# Patient Record
Sex: Male | Born: 1945 | Race: Asian | Hispanic: No | Marital: Married | Smoking: Current some day smoker
Health system: Southern US, Community
[De-identification: ages and names within clinical notes are randomized; demographics above are authoritative.]

## PROBLEM LIST (undated history)

## (undated) DIAGNOSIS — N189 Chronic kidney disease, unspecified: Secondary | ICD-10-CM

## (undated) DIAGNOSIS — F419 Anxiety disorder, unspecified: Secondary | ICD-10-CM

## (undated) DIAGNOSIS — E785 Hyperlipidemia, unspecified: Secondary | ICD-10-CM

## (undated) DIAGNOSIS — D649 Anemia, unspecified: Secondary | ICD-10-CM

## (undated) DIAGNOSIS — Z9289 Personal history of other medical treatment: Secondary | ICD-10-CM

## (undated) DIAGNOSIS — N186 End stage renal disease: Secondary | ICD-10-CM

## (undated) DIAGNOSIS — F329 Major depressive disorder, single episode, unspecified: Secondary | ICD-10-CM

## (undated) DIAGNOSIS — I10 Essential (primary) hypertension: Secondary | ICD-10-CM

## (undated) DIAGNOSIS — R0600 Dyspnea, unspecified: Secondary | ICD-10-CM

## (undated) DIAGNOSIS — R011 Cardiac murmur, unspecified: Secondary | ICD-10-CM

## (undated) DIAGNOSIS — E119 Type 2 diabetes mellitus without complications: Secondary | ICD-10-CM

## (undated) DIAGNOSIS — I509 Heart failure, unspecified: Secondary | ICD-10-CM

## (undated) DIAGNOSIS — C801 Malignant (primary) neoplasm, unspecified: Secondary | ICD-10-CM

## (undated) DIAGNOSIS — E079 Disorder of thyroid, unspecified: Secondary | ICD-10-CM

## (undated) DIAGNOSIS — F32A Depression, unspecified: Secondary | ICD-10-CM

## (undated) DIAGNOSIS — N400 Enlarged prostate without lower urinary tract symptoms: Secondary | ICD-10-CM

## (undated) DIAGNOSIS — K219 Gastro-esophageal reflux disease without esophagitis: Secondary | ICD-10-CM

## (undated) DIAGNOSIS — I480 Paroxysmal atrial fibrillation: Secondary | ICD-10-CM

## (undated) HISTORY — DX: Type 2 diabetes mellitus without complications: E11.9

## (undated) HISTORY — DX: Hyperlipidemia, unspecified: E78.5

## (undated) HISTORY — PX: APPENDECTOMY: SHX54

## (undated) HISTORY — DX: Essential (primary) hypertension: I10

## (undated) HISTORY — DX: Chronic kidney disease, unspecified: N18.9

## (undated) HISTORY — DX: Disorder of thyroid, unspecified: E07.9

## (undated) HISTORY — DX: Heart failure, unspecified: I50.9

## (undated) HISTORY — DX: Depression, unspecified: F32.A

## (undated) HISTORY — DX: Anemia, unspecified: D64.9

---
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
## 2013-06-28 DIAGNOSIS — Z23 Encounter for immunization: Secondary | ICD-10-CM | POA: Diagnosis not present

## 2013-08-08 ENCOUNTER — Ambulatory Visit (INDEPENDENT_AMBULATORY_CARE_PROVIDER_SITE_OTHER): Payer: Medicare Other | Admitting: Family Medicine

## 2013-08-08 VITALS — BP 140/82 | HR 73 | Temp 98.2°F | Resp 18 | Ht 66.5 in | Wt 162.0 lb

## 2013-08-08 DIAGNOSIS — M109 Gout, unspecified: Secondary | ICD-10-CM | POA: Diagnosis not present

## 2013-08-08 DIAGNOSIS — E039 Hypothyroidism, unspecified: Secondary | ICD-10-CM | POA: Diagnosis not present

## 2013-08-08 DIAGNOSIS — I10 Essential (primary) hypertension: Secondary | ICD-10-CM

## 2013-08-08 LAB — COMPREHENSIVE METABOLIC PANEL
AST: 10 U/L (ref 0–37)
Albumin: 4.5 g/dL (ref 3.5–5.2)
Alkaline Phosphatase: 59 U/L (ref 39–117)
BILIRUBIN TOTAL: 0.5 mg/dL (ref 0.2–1.2)
BUN: 28 mg/dL — AB (ref 6–23)
CALCIUM: 9.7 mg/dL (ref 8.4–10.5)
CHLORIDE: 100 meq/L (ref 96–112)
CO2: 32 meq/L (ref 19–32)
CREATININE: 1.34 mg/dL (ref 0.50–1.35)
Glucose, Bld: 124 mg/dL — ABNORMAL HIGH (ref 70–99)
Potassium: 3.8 mEq/L (ref 3.5–5.3)
Sodium: 138 mEq/L (ref 135–145)
Total Protein: 7.3 g/dL (ref 6.0–8.3)

## 2013-08-08 MED ORDER — TRIAMTERENE 50 MG PO CAPS: 50.0000 mg | ORAL_CAPSULE | Freq: Two times a day (BID) | ORAL | Status: DC

## 2013-08-08 MED ORDER — ALLOPURINOL 100 MG PO TABS: 100.0000 mg | ORAL_TABLET | Freq: Every day | ORAL | Status: DC

## 2013-08-08 MED ORDER — ATENOLOL 50 MG PO TABS: 50.0000 mg | ORAL_TABLET | Freq: Every day | ORAL | Status: DC

## 2013-08-08 MED ORDER — AMLODIPINE BESYLATE 5 MG PO TABS: 5.0000 mg | ORAL_TABLET | Freq: Every day | ORAL | Status: DC

## 2013-08-08 MED ORDER — LEVOTHYROXINE SODIUM 50 MCG PO TABS: 100.0000 ug | ORAL_TABLET | Freq: Every day | ORAL | Status: DC

## 2013-08-08 MED ORDER — HYDROCHLOROTHIAZIDE 25 MG PO TABS: 25.0000 mg | ORAL_TABLET | Freq: Every day | ORAL | Status: DC

## 2013-08-08 NOTE — Progress Notes (Signed)
Subjective: 68 year old man from [place] who does not speak [place]. His wife speaks good [place]. [place] He has medications for his blood pressure and thyroid. He's been out of his thyroid medication for one week. He does not know what the dose was, and threw the bottle away. It was a little white pill. I suspect it was the 50 mcg because that is white. Review of systems is really unremarkable. He does quite well.  Objective: No acute distress. Neck supple without nodes or thyromegaly. Chest clear. Heart regular without murmurs.  Assessment: Hypothyroidism Hypertension History of gout (based on his medicines)  Plan: Prescribe his medications for 6 months Check C. met and TSH

## 2013-08-08 NOTE — Patient Instructions (Signed)
See if you can find out what dose of thyroid you were on at home.  I have prescribed all of your medications in case you've run out  I will let you know the results of your labs  Return in about 6 months if you were here in the [place]

## 2013-08-09 ENCOUNTER — Encounter: Payer: Self-pay | Admitting: Family Medicine

## 2013-08-09 LAB — TSH: TSH: 3.322 u[IU]/mL (ref 0.350–4.500)

## 2013-08-14 ENCOUNTER — Telehealth: Payer: Self-pay

## 2013-08-14 DIAGNOSIS — I10 Essential (primary) hypertension: Secondary | ICD-10-CM

## 2013-08-14 NOTE — Telephone Encounter (Signed)
PA needed for Dyrenium. Wife reports pt uses this for swelling under the eyes. She does not know of any other meds he has used for this previously. [employer] and they are faxing form to complete.

## 2013-08-30 NOTE — Telephone Encounter (Signed)
Discontinue dyrenium  rx aldactone 25  #30 one daily  refillx x 1  Tell patient to return in 6 weeks to recheck BP.

## 2013-08-30 NOTE — Telephone Encounter (Signed)
Called [employer] back because have never received PA form. Completed PA on the phone and it was pended for review.

## 2013-08-30 NOTE — Telephone Encounter (Signed)
[employer] called and denied d/t med not being on formulary and pt has not tried and failed the two formulary alternatives which are spirolactone or HCTZ-triamterene. Dr Linna Darner, do you want to Rx one of these?

## 2013-08-31 NOTE — Telephone Encounter (Signed)
Left message on machine to call back  

## 2013-09-01 ENCOUNTER — Telehealth: Payer: Self-pay

## 2013-09-01 MED ORDER — SPIRONOLACTONE 25 MG PO TABS: 25.0000 mg | ORAL_TABLET | Freq: Every day | ORAL | Status: DC

## 2013-09-01 NOTE — Telephone Encounter (Signed)
Spoke to wife and gave her the message about the new medication. Will have

## 2013-09-01 NOTE — Telephone Encounter (Signed)
....  Chelle send it in to pharmacy

## 2013-09-01 NOTE — Telephone Encounter (Signed)
[phone] PATIENTS WIFE CALLING ABOUT A CALL THAT THEY RECEIVED YESTERDAY

## 2013-09-01 NOTE — Telephone Encounter (Signed)
Spoke to wife and Chelle will send in her script

## 2013-09-01 NOTE — Telephone Encounter (Signed)
Rx sent.  Meds ordered this encounter  Medications  . spironolactone (ALDACTONE) 25 MG tablet    Sig: Take 1 tablet (25 mg total) by mouth daily.    Dispense:  30 tablet    Refill:  1    Order Specific Question:  Supervising Provider    Answer:  DOOLITTLE, ROBERT P [id number]

## 2013-10-04 ENCOUNTER — Other Ambulatory Visit: Payer: Self-pay | Admitting: Family Medicine

## 2013-10-19 ENCOUNTER — Ambulatory Visit (INDEPENDENT_AMBULATORY_CARE_PROVIDER_SITE_OTHER): Payer: Medicare Other | Admitting: Family Medicine

## 2013-10-19 VITALS — BP 114/68 | HR 70 | Temp 98.0°F | Resp 17 | Ht 65.5 in | Wt 164.0 lb

## 2013-10-19 DIAGNOSIS — I10 Essential (primary) hypertension: Secondary | ICD-10-CM | POA: Diagnosis not present

## 2013-10-19 DIAGNOSIS — M109 Gout, unspecified: Secondary | ICD-10-CM

## 2013-10-19 DIAGNOSIS — E039 Hypothyroidism, unspecified: Secondary | ICD-10-CM | POA: Diagnosis not present

## 2013-10-19 DIAGNOSIS — R7309 Other abnormal glucose: Secondary | ICD-10-CM

## 2013-10-19 LAB — POCT GLYCOSYLATED HEMOGLOBIN (HGB A1C): HEMOGLOBIN A1C: 6.2

## 2013-10-19 MED ORDER — SPIRONOLACTONE 25 MG PO TABS: 25.0000 mg | ORAL_TABLET | Freq: Every day | ORAL | Status: DC

## 2013-10-19 MED ORDER — HYDROCHLOROTHIAZIDE 25 MG PO TABS: 25.0000 mg | ORAL_TABLET | Freq: Every day | ORAL | Status: DC

## 2013-10-19 MED ORDER — LEVOTHYROXINE SODIUM 50 MCG PO TABS: ORAL_TABLET | ORAL | Status: DC

## 2013-10-19 MED ORDER — ALLOPURINOL 100 MG PO TABS: 100.0000 mg | ORAL_TABLET | Freq: Every day | ORAL | Status: DC

## 2013-10-19 MED ORDER — AMLODIPINE BESYLATE 5 MG PO TABS: 5.0000 mg | ORAL_TABLET | Freq: Every day | ORAL | Status: DC

## 2013-10-19 MED ORDER — ATENOLOL 50 MG PO TABS: 50.0000 mg | ORAL_TABLET | Freq: Every day | ORAL | Status: DC

## 2013-10-19 NOTE — Progress Notes (Deleted)
   Subjective:    Patient ID: Adam Reid, male    DOB: 1945-11-22, 68 y.o.   MRN: [id number]  HPI    Review of Systems     Objective:   Physical Exam        Assessment & Plan:

## 2013-10-19 NOTE — Patient Instructions (Addendum)
Continue current medications  I will let you know the results of your labs and whether we need to make any changes.  If you are continuing to do well weight and return in about 6 months. Check in September or to find out when it would be best to come in since I may be out of the country in October.

## 2013-10-19 NOTE — Progress Notes (Signed)
Subjective: Patient is here for routine followup visit. He is doing well. He has not had any major complaints. He has taken only 50 mcg of Levothroid rather than 100 mcg. We will recheck the level and see how he is doing. He has no other major complaints. His HEENT is normal. Respiratory normal. Cardiovascular normal. GI normal. Musculoskeletal normal. GU normal.  He has not gone back to air on any since his last time here. His mother who is 65 is being taken care of by his sister.  Objective: Pleasant man in no acute distress. His wife interpreted. He is TMs are normal. Throat clear. Neck supple without nodes or thyromegaly. No carotid bruit chest clear. Heart regular without murmurs. Abdomen soft and nontender. No ankle edema.  Assessment: Hypertension Hypothyroidism History of hyperuricemia  Plan: Represcribed his medications Return in 6 months, sooner if problems.

## 2013-10-20 LAB — BASIC METABOLIC PANEL
BUN: 26 mg/dL — ABNORMAL HIGH (ref 6–23)
CALCIUM: 9.4 mg/dL (ref 8.4–10.5)
CO2: 30 meq/L (ref 19–32)
CREATININE: 1.37 mg/dL — AB (ref 0.50–1.35)
Chloride: 102 mEq/L (ref 96–112)
Glucose, Bld: 120 mg/dL — ABNORMAL HIGH (ref 70–99)
Potassium: 3.8 mEq/L (ref 3.5–5.3)
Sodium: 141 mEq/L (ref 135–145)

## 2013-10-20 LAB — TSH: TSH: 1.862 u[IU]/mL (ref 0.350–4.500)

## 2013-10-20 LAB — URIC ACID: Uric Acid, Serum: 5.9 mg/dL (ref 4.0–7.8)

## 2013-10-23 ENCOUNTER — Encounter: Payer: Self-pay | Admitting: Family Medicine

## 2013-10-24 ENCOUNTER — Telehealth: Payer: Self-pay

## 2013-10-24 NOTE — Telephone Encounter (Signed)
Received PA req from pharm. This med is on preferred list on ins and shouldn't need PA. Pharmacist reported that the Rx sent on 10/19/13 by Dr Linna Darner was marked DAW w/note to dispense name brand only. I do not see those notes on our end in EPIC. It looks like it was sent over for generic and no notes in OV notes concerning need for name brand. Dr Linna Darner, does pt need the name brand only, and if so, why? I will complete PA if brand necessary.

## 2013-10-26 NOTE — Telephone Encounter (Signed)
If it was marked dispense as written that was an error as far as I know. Generic is fine.

## 2013-10-31 NOTE — Telephone Encounter (Signed)
Notified pharmacist.

## 2014-01-15 ENCOUNTER — Other Ambulatory Visit: Payer: Self-pay | Admitting: Family Medicine

## 2014-04-14 ENCOUNTER — Other Ambulatory Visit: Payer: Self-pay | Admitting: Family Medicine

## 2014-04-30 ENCOUNTER — Ambulatory Visit (INDEPENDENT_AMBULATORY_CARE_PROVIDER_SITE_OTHER): Payer: Medicare Other

## 2014-04-30 ENCOUNTER — Ambulatory Visit (INDEPENDENT_AMBULATORY_CARE_PROVIDER_SITE_OTHER): Payer: Medicare Other | Admitting: Internal Medicine

## 2014-04-30 VITALS — BP 122/80 | HR 58 | Temp 98.4°F | Resp 18 | Ht 67.0 in | Wt 158.0 lb

## 2014-04-30 DIAGNOSIS — E039 Hypothyroidism, unspecified: Secondary | ICD-10-CM | POA: Diagnosis not present

## 2014-04-30 DIAGNOSIS — I639 Cerebral infarction, unspecified: Secondary | ICD-10-CM

## 2014-04-30 DIAGNOSIS — R634 Abnormal weight loss: Secondary | ICD-10-CM

## 2014-04-30 DIAGNOSIS — I10 Essential (primary) hypertension: Secondary | ICD-10-CM | POA: Diagnosis not present

## 2014-04-30 DIAGNOSIS — Z1211 Encounter for screening for malignant neoplasm of colon: Secondary | ICD-10-CM | POA: Diagnosis not present

## 2014-04-30 DIAGNOSIS — M1000 Idiopathic gout, unspecified site: Secondary | ICD-10-CM | POA: Diagnosis not present

## 2014-04-30 DIAGNOSIS — Z125 Encounter for screening for malignant neoplasm of prostate: Secondary | ICD-10-CM | POA: Diagnosis not present

## 2014-04-30 DIAGNOSIS — Z79899 Other long term (current) drug therapy: Secondary | ICD-10-CM

## 2014-04-30 DIAGNOSIS — M10079 Idiopathic gout, unspecified ankle and foot: Secondary | ICD-10-CM | POA: Diagnosis not present

## 2014-04-30 DIAGNOSIS — M109 Gout, unspecified: Secondary | ICD-10-CM

## 2014-04-30 DIAGNOSIS — I6350 Cerebral infarction due to unspecified occlusion or stenosis of unspecified cerebral artery: Secondary | ICD-10-CM | POA: Diagnosis not present

## 2014-04-30 DIAGNOSIS — Z789 Other specified health status: Secondary | ICD-10-CM

## 2014-04-30 LAB — POCT CBC
Granulocyte percent: 76.4 %G (ref 37–80)
HEMATOCRIT: 45.8 % (ref 43.5–53.7)
HEMOGLOBIN: 15.4 g/dL (ref 14.1–18.1)
LYMPH, POC: 2.1 (ref 0.6–3.4)
MCH, POC: 28.8 pg (ref 27–31.2)
MCHC: 33.5 g/dL (ref 31.8–35.4)
MCV: 86 fL (ref 80–97)
MID (cbc): 0.5 (ref 0–0.9)
MPV: 8.5 fL (ref 0–99.8)
POC GRANULOCYTE: 8.2 — AB (ref 2–6.9)
POC LYMPH PERCENT: 19.2 %L (ref 10–50)
POC MID %: 4.4 %M (ref 0–12)
Platelet Count, POC: 289 10*3/uL (ref 142–424)
RBC: 5.33 M/uL (ref 4.69–6.13)
RDW, POC: 13.7 %
WBC: 10.7 10*3/uL — AB (ref 4.6–10.2)

## 2014-04-30 LAB — POCT UA - MICROSCOPIC ONLY
Bacteria, U Microscopic: NEGATIVE
Casts, Ur, LPF, POC: NEGATIVE
Crystals, Ur, HPF, POC: NEGATIVE
RBC, urine, microscopic: NEGATIVE
YEAST UA: NEGATIVE

## 2014-04-30 LAB — POCT URINALYSIS DIPSTICK
GLUCOSE UA: NEGATIVE
LEUKOCYTES UA: NEGATIVE
NITRITE UA: NEGATIVE
Protein, UA: 100
RBC UA: NEGATIVE
Spec Grav, UA: >=1.03
Urobilinogen, UA: 1
pH, UA: 5.5

## 2014-04-30 LAB — IFOBT (OCCULT BLOOD): IFOBT: NEGATIVE

## 2014-04-30 MED ORDER — AMLODIPINE BESYLATE 10 MG PO TABS: 10.0000 mg | ORAL_TABLET | Freq: Every day | ORAL | Status: DC

## 2014-04-30 MED ORDER — HYDROCHLOROTHIAZIDE 25 MG PO TABS: ORAL_TABLET | ORAL | Status: DC

## 2014-04-30 MED ORDER — ATENOLOL 50 MG PO TABS: 50.0000 mg | ORAL_TABLET | Freq: Every day | ORAL | Status: DC

## 2014-04-30 NOTE — Patient Instructions (Signed)
Smoking Cessation Quitting smoking is important to your health and has many advantages. However, it is not always easy to quit since nicotine is a very addictive drug. Oftentimes, people try 3 times or more before being able to quit. This document explains the best ways for you to prepare to quit smoking. Quitting takes hard work and a lot of effort, but you can do it. ADVANTAGES OF QUITTING SMOKING  You will live longer, feel better, and live better.  Your body will feel the impact of quitting smoking almost immediately.  Within 20 minutes, blood pressure decreases. Your pulse returns to its normal level.  After 8 hours, carbon monoxide levels in the blood return to normal. Your oxygen level increases.  After 24 hours, the chance of having a heart attack starts to decrease. Your breath, hair, and body stop smelling like smoke.  After 48 hours, damaged nerve endings begin to recover. Your sense of taste and smell improve.  After 72 hours, the body is virtually free of nicotine. Your bronchial tubes relax and breathing becomes easier.  After 2 to 12 weeks, lungs can hold more air. Exercise becomes easier and circulation improves.  The risk of having a heart attack, stroke, cancer, or lung disease is greatly reduced.  After 1 year, the risk of coronary heart disease is cut in half.  After 5 years, the risk of stroke falls to the same as a nonsmoker.  After 10 years, the risk of lung cancer is cut in half and the risk of other cancers decreases significantly.  After 15 years, the risk of coronary heart disease drops, usually to the level of a nonsmoker.  If you are pregnant, quitting smoking will improve your chances of having a healthy baby.  The people you live with, especially any children, will be healthier.  You will have extra money to spend on things other than cigarettes. QUESTIONS TO THINK ABOUT BEFORE ATTEMPTING TO QUIT You may want to talk about your answers with your  health care provider.  Why do you want to quit?  If you tried to quit in the past, what helped and what did not?  What will be the most difficult situations for you after you quit? How will you plan to handle them?  Who can help you through the tough times? Your family? Friends? A health care provider?  What pleasures do you get from smoking? What ways can you still get pleasure if you quit? Here are some questions to ask your health care provider:  How can you help me to be successful at quitting?  What medicine do you think would be best for me and how should I take it?  What should I do if I need more help?  What is smoking withdrawal like? How can I get information on withdrawal? GET READY  Set a quit date.  Change your environment by getting rid of all cigarettes, ashtrays, matches, and lighters in your home, car, or work. Do not let people smoke in your home.  Review your past attempts to quit. Think about what worked and what did not. GET SUPPORT AND ENCOURAGEMENT You have a better chance of being successful if you have help. You can get support in many ways.  Tell your family, friends, and coworkers that you are going to quit and need their support. Ask them not to smoke around you.  Get individual, group, or telephone counseling and support. Programs are available at local hospitals and health centers. Call   your local health department for information about programs in your area.  Spiritual beliefs and practices may help some smokers quit.  Download a "quit meter" on your computer to keep track of quit statistics, such as how long you have gone without smoking, cigarettes not smoked, and money saved.  Get a self-help book about quitting smoking and staying off tobacco. [place] yourself from urges to smoke. Talk to someone, go for a walk, or occupy your time with a task.  Change your normal routine. Take a different route to work.  Drink tea instead of coffee. Eat breakfast in a different place.  Reduce your stress. Take a hot bath, exercise, or read a book.  Plan something enjoyable to do every day. Reward yourself for not smoking.  Explore interactive web-based programs that specialize in helping you quit. GET MEDICINE AND USE IT CORRECTLY Medicines can help you stop smoking and decrease the urge to smoke. Combining medicine with the above behavioral methods and support can greatly increase your chances of successfully quitting smoking.  Nicotine replacement therapy helps deliver nicotine to your body without the negative effects and risks of smoking. Nicotine replacement therapy includes nicotine gum, lozenges, inhalers, nasal sprays, and skin patches. Some may be available over-the-counter and others require a prescription.  Antidepressant medicine helps people abstain from smoking, but how this works is unknown. This medicine is available by prescription.  Nicotinic receptor partial agonist medicine simulates the effect of nicotine in your brain. This medicine is available by prescription. Ask your health care provider for advice about which medicines to use and how to use them based on your health history. Your health care provider will tell you what side effects to look out for if you choose to be on a medicine or therapy. Carefully read the information on the package. Do not use any other product containing nicotine while using a nicotine replacement product.  RELAPSE OR DIFFICULT SITUATIONS Most relapses occur within the first 3 months after quitting. Do not be discouraged if you start smoking again. Remember, most people try several times before finally quitting. You may have symptoms of withdrawal because your body is used to nicotine. You may crave cigarettes, be irritable, feel very hungry, cough often, get headaches, or have difficulty concentrating. The withdrawal symptoms are only temporary. They are strongest  when you first quit, but they will go away within 10-14 days. To reduce the chances of relapse, try to:  Avoid drinking alcohol. Drinking lowers your chances of successfully quitting.  Reduce the amount of caffeine you consume. Once you quit smoking, the amount of caffeine in your body increases and can give you symptoms, such as a rapid heartbeat, sweating, and anxiety.  Avoid smokers because they can make you want to smoke.  Do not let weight gain distract you. Many smokers will gain weight when they quit, usually less than 10 pounds. Eat a healthy diet and stay active. You can always lose the weight gained after you quit.  Find ways to improve your mood other than smoking. FOR MORE INFORMATION  www.smokefree.gov  Document Released: 06/22/2001 Document Revised: 11/12/2013 Document Reviewed: 10/07/2011 [facility] Patient Information 2015 [place], [place]. This information is not intended to replace advice given to you by your health care provider. Make sure you discuss any questions you have with your health care provider. Gout Gout is an inflammatory arthritis caused by a buildup of uric acid crystals in the joints. Uric acid is a chemical  that is normally present in the blood. When the level of uric acid in the blood is too high it can form crystals that deposit in your joints and tissues. This causes joint redness, soreness, and swelling (inflammation). Repeat attacks are common. Over time, uric acid crystals can form into masses (tophi) near a joint, destroying bone and causing disfigurement. Gout is treatable and often preventable. CAUSES  The disease begins with elevated levels of uric acid in the blood. Uric acid is produced by your body when it breaks down a naturally found substance called purines. Certain foods you eat, such as meats and fish, contain high amounts of purines. Causes of an elevated uric acid level include:  Being passed down from parent to child (heredity).  Diseases  that cause increased uric acid production (such as obesity, psoriasis, and certain cancers).  Excessive alcohol use.  Diet, especially diets rich in meat and seafood.  Medicines, including certain cancer-fighting medicines (chemotherapy), water pills (diuretics), and aspirin.  Chronic kidney disease. The kidneys are no longer able to remove uric acid well.  Problems with metabolism. Conditions strongly associated with gout include:  Obesity.  High blood pressure.  High cholesterol.  Diabetes. Not everyone with elevated uric acid levels gets gout. It is not understood why some people get gout and others do not. Surgery, joint injury, and eating too much of certain foods are some of the factors that can lead to gout attacks. SYMPTOMS   An attack of gout comes on quickly. It causes intense pain with redness, swelling, and warmth in a joint.  Fever can occur.  Often, only one joint is involved. Certain joints are more commonly involved:  Base of the big toe.  Knee.  Ankle.  Wrist.  Finger. Without treatment, an attack usually goes away in a few days to weeks. Between attacks, you usually will not have symptoms, which is different from many other forms of arthritis. DIAGNOSIS  Your caregiver will suspect gout based on your symptoms and exam. In some cases, tests may be recommended. The tests may include:  Blood tests.  Urine tests.  X-rays.  Joint fluid exam. This exam requires a needle to remove fluid from the joint (arthrocentesis). Using a microscope, gout is confirmed when uric acid crystals are seen in the joint fluid. TREATMENT  There are two phases to gout treatment: treating the sudden onset (acute) attack and preventing attacks (prophylaxis).  Treatment of an Acute Attack.  Medicines are used. These include anti-inflammatory medicines or steroid medicines.  An injection of steroid medicine into the affected joint is sometimes necessary.  The painful joint  is rested. Movement can worsen the arthritis.  You may use warm or cold treatments on painful joints, depending which works best for you.  Treatment to Prevent Attacks.  If you suffer from frequent gout attacks, your caregiver may advise preventive medicine. These medicines are started after the acute attack subsides. These medicines either help your kidneys eliminate uric acid from your body or decrease your uric acid production. You may need to stay on these medicines for a very long time.  The early phase of treatment with preventive medicine can be associated with an increase in acute gout attacks. For this reason, during the first few months of treatment, your caregiver may also advise you to take medicines usually used for acute gout treatment. Be sure you understand your caregiver's directions. Your caregiver may make several adjustments to your medicine dose before these medicines are effective.  Discuss  dietary treatment with your caregiver or dietitian. Alcohol and drinks high in sugar and fructose and foods such as meat, poultry, and seafood can increase uric acid levels. Your caregiver or dietitian can advise you on drinks and foods that should be limited. HOME CARE INSTRUCTIONS   Do not take aspirin to relieve pain. This raises uric acid levels.  Only take over-the-counter or prescription medicines for pain, discomfort, or fever as directed by your caregiver.  Rest the joint as much as possible. When in bed, keep sheets and blankets off painful areas.  Keep the affected joint raised (elevated).  Apply warm or cold treatments to painful joints. Use of warm or cold treatments depends on which works best for you.  Use crutches if the painful joint is in your leg.  Drink enough fluids to keep your urine clear or pale yellow. This helps your body get rid of uric acid. Limit alcohol, sugary drinks, and fructose drinks.  Follow your dietary instructions. Pay careful attention to the  amount of protein you eat. Your daily diet should emphasize fruits, vegetables, whole grains, and fat-free or low-fat milk products. Discuss the use of coffee, vitamin C, and cherries with your caregiver or dietitian. These may be helpful in lowering uric acid levels.  Maintain a healthy body weight. SEEK MEDICAL CARE IF:   You develop diarrhea, vomiting, or any side effects from medicines.  You do not feel better in 24 hours, or you are getting worse. SEEK IMMEDIATE MEDICAL CARE IF:   Your joint becomes suddenly more tender, and you have chills or a fever. MAKE SURE YOU:   Understand these instructions.  Will watch your condition.  Will get help right away if you are not doing well or get worse. Document Released: 06/25/2000 Document Revised: 11/12/2013 Document Reviewed: 02/09/2012 [facility] Patient Information 2015 [place], [place]. This information is not intended to replace advice given to you by your health care provider. Make sure you discuss any questions you have with your health care provider.

## 2014-04-30 NOTE — Progress Notes (Signed)
Subjective:    Patient ID: Adam Reid, male    DOB: October 05, 1945, 68 y.o.   MRN: [id number]  HPI 68 year old male here for follow up on gout.  He needs follow up labs to compare to previous labs.  He also needs refills on Levothyroxine. Patient is also concerned about recent weight loss.He states he has been losing his appetite.  Patient advised to stop smoking and try nicotine gum. He has never had heart problems and never had an EKG or  colonoscopy. Last exam of prostate was in [place].Was told it was enlarged. His meds and diagnosises have been initiated in [place]. Speaking [place] language, wife interprets.  Smoker Review of Systems     Objective:   Physical Exam  Vitals reviewed. Constitutional: He is oriented to person, place, and time. He appears well-developed and well-nourished. No distress.  HENT:  Head: Normocephalic.  Right Ear: External ear normal.  Left Ear: External ear normal.  Nose: Nose normal.  Mouth/Throat: Uvula is midline. Posterior oropharyngeal erythema present.  Eyes: Conjunctivae and EOM are normal. Pupils are equal, round, and reactive to light.  Neck: Normal range of motion. Neck supple. No tracheal deviation present. No thyromegaly present.  Cardiovascular: Normal rate, regular rhythm and normal heart sounds.   Pulmonary/Chest: Effort normal and breath sounds normal. No respiratory distress. He has no wheezes. He has no rales. He exhibits no tenderness.  Abdominal: Soft. Bowel sounds are normal. He exhibits no distension. There is no tenderness. There is no rebound and no guarding. Hernia confirmed negative in the right inguinal area and confirmed negative in the left inguinal area.  Genitourinary: Rectum normal and penis normal. Right testis shows mass and tenderness. Right testis shows no swelling. Left testis shows mass and tenderness. Left testis shows no swelling. Circumcised. No penile tenderness.  Multiple cysts both testis  Musculoskeletal: Normal range  of motion. He exhibits no edema and no tenderness.  Lymphadenopathy:    He has no cervical adenopathy.  Neurological: He is alert and oriented to person, place, and time. He has normal reflexes. No cranial nerve deficit. He exhibits normal muscle tone. Coordination normal.  Psychiatric: He has a normal mood and affect. His behavior is normal. Judgment and thought content normal.  Heart rate 66 during exam  EKG marked sinus bradycardia HR 48 [facility] reading (PRIMARY) by  Dr.Guest cxr abnormal, right hemi diaphragm smooth round mass versus evantration, medistinal mass possible  Results for orders placed in visit on 04/30/14  POCT UA - MICROSCOPIC ONLY      Result Value Ref Range   WBC, Ur, HPF, POC 0-3     RBC, urine, microscopic neg     Bacteria, U Microscopic neg     Mucus, UA large     Epithelial cells, urine per micros 0-5     Crystals, Ur, HPF, POC neg     Casts, Ur, LPF, POC neg     Yeast, UA neg    POCT URINALYSIS DIPSTICK      Result Value Ref Range   Color, UA dark yellow     Clarity, UA slightly cloudy     Glucose, UA neg     Bilirubin, UA moderate     Ketones, UA trace     Spec Grav, UA >=1.030     Blood, UA neg     pH, UA 5.5     Protein, UA 100     Urobilinogen, UA 1.0     Nitrite, UA  neg     Leukocytes, UA Negative    POCT CBC      Result Value Ref Range   WBC 10.7 (*) 4.6 - 10.2 K/uL   Lymph, poc 2.1  0.6 - 3.4   POC LYMPH PERCENT 19.2  10 - 50 %L   MID (cbc) 0.5  0 - 0.9   POC MID % 4.4  0 - 12 %M   POC Granulocyte 8.2 (*) 2 - 6.9   Granulocyte percent 76.4  37 - 80 %G   RBC 5.33  4.69 - 6.13 M/uL   Hemoglobin 15.4  14.1 - 18.1 g/dL   HCT, POC 45.8  43.5 - 53.7 %   MCV 86.0  80 - 97 fL   MCH, POC 28.8  27 - 31.2 pg   MCHC 33.5  31.8 - 35.4 g/dL   RDW, POC 13.7     Platelet Count, POC 289  142 - 424 K/uL   MPV 8.5  0 - 99.8 fL         Assessment & Plan:  Quit smoking Bradycardia--on atenolol Possibly euthyroid will hold synthroid and repeat TSH  1-2 months Will hold spironolactone Reduce HCTZ to 12.5 qd Start amlodipine 10mg  qd DC allopurinol Refer for colonoscopy

## 2014-05-01 LAB — COMPREHENSIVE METABOLIC PANEL
ALK PHOS: 81 U/L (ref 39–117)
ALT: 17 U/L (ref 0–53)
AST: 19 U/L (ref 0–37)
Albumin: 5.1 g/dL (ref 3.5–5.2)
BUN: 29 mg/dL — ABNORMAL HIGH (ref 6–23)
CO2: 28 meq/L (ref 19–32)
Calcium: 9.8 mg/dL (ref 8.4–10.5)
Chloride: 99 mEq/L (ref 96–112)
Creat: 1.8 mg/dL — ABNORMAL HIGH (ref 0.50–1.35)
GLUCOSE: 128 mg/dL — AB (ref 70–99)
Potassium: 4.5 mEq/L (ref 3.5–5.3)
Sodium: 137 mEq/L (ref 135–145)
Total Bilirubin: 0.8 mg/dL (ref 0.2–1.2)
Total Protein: 8 g/dL (ref 6.0–8.3)

## 2014-05-01 LAB — LIPID PANEL
Cholesterol: 229 mg/dL — ABNORMAL HIGH (ref 0–200)
HDL: 55 mg/dL (ref >39)
LDL CALC: 125 mg/dL — AB (ref 0–99)
TRIGLYCERIDES: 245 mg/dL — AB (ref <150)
Total CHOL/HDL Ratio: 4.2 Ratio
VLDL: 49 mg/dL — ABNORMAL HIGH (ref 0–40)

## 2014-05-01 LAB — URIC ACID: Uric Acid, Serum: 6.3 mg/dL (ref 4.0–7.8)

## 2014-05-01 LAB — TSH: TSH: 2.718 u[IU]/mL (ref 0.350–4.500)

## 2014-05-01 LAB — PSA: PSA: 3.77 ng/mL (ref <=4.00)

## 2014-05-17 ENCOUNTER — Other Ambulatory Visit: Payer: Self-pay | Admitting: Physician Assistant

## 2014-06-20 ENCOUNTER — Telehealth: Payer: Self-pay

## 2014-06-20 NOTE — Telephone Encounter (Signed)
Received fax from [facility] indicating pt may not be adhering to correct dosing of atenolol. I called and LMOM for pt voicing our concern and asking him to CB if he is having any problem taking/getting this medication. Also advised that [name] had wanted pt to RTC for f/up 2 wks after last OV in Oct. And that he should return to make sure new BP regime is working well.

## 2014-06-24 ENCOUNTER — Ambulatory Visit (INDEPENDENT_AMBULATORY_CARE_PROVIDER_SITE_OTHER): Payer: Medicare Other | Admitting: Internal Medicine

## 2014-06-24 VITALS — BP 118/64 | HR 61 | Temp 97.9°F | Resp 18 | Ht 66.0 in | Wt 161.6 lb

## 2014-06-24 DIAGNOSIS — N400 Enlarged prostate without lower urinary tract symptoms: Secondary | ICD-10-CM | POA: Diagnosis not present

## 2014-06-24 DIAGNOSIS — N289 Disorder of kidney and ureter, unspecified: Secondary | ICD-10-CM

## 2014-06-24 DIAGNOSIS — I639 Cerebral infarction, unspecified: Secondary | ICD-10-CM | POA: Diagnosis not present

## 2014-06-24 DIAGNOSIS — E7800 Pure hypercholesterolemia, unspecified: Secondary | ICD-10-CM

## 2014-06-24 DIAGNOSIS — R7309 Other abnormal glucose: Secondary | ICD-10-CM | POA: Diagnosis not present

## 2014-06-24 DIAGNOSIS — N184 Chronic kidney disease, stage 4 (severe): Secondary | ICD-10-CM

## 2014-06-24 DIAGNOSIS — Z8639 Personal history of other endocrine, nutritional and metabolic disease: Secondary | ICD-10-CM

## 2014-06-24 DIAGNOSIS — I10 Essential (primary) hypertension: Secondary | ICD-10-CM | POA: Diagnosis not present

## 2014-06-24 DIAGNOSIS — I1310 Hypertensive heart and chronic kidney disease without heart failure, with stage 1 through stage 4 chronic kidney disease, or unspecified chronic kidney disease: Secondary | ICD-10-CM | POA: Insufficient documentation

## 2014-06-24 DIAGNOSIS — R739 Hyperglycemia, unspecified: Secondary | ICD-10-CM | POA: Diagnosis not present

## 2014-06-24 DIAGNOSIS — Z79899 Other long term (current) drug therapy: Secondary | ICD-10-CM | POA: Diagnosis not present

## 2014-06-24 DIAGNOSIS — E118 Type 2 diabetes mellitus with unspecified complications: Secondary | ICD-10-CM

## 2014-06-24 LAB — GLUCOSE, POCT (MANUAL RESULT ENTRY): POC GLUCOSE: 136 mg/dL — AB (ref 70–99)

## 2014-06-24 LAB — BASIC METABOLIC PANEL
BUN: 26 mg/dL — ABNORMAL HIGH (ref 6–23)
CALCIUM: 9.8 mg/dL (ref 8.4–10.5)
CO2: 29 mEq/L (ref 19–32)
CREATININE: 1.47 mg/dL — AB (ref 0.50–1.35)
Chloride: 95 mEq/L — ABNORMAL LOW (ref 96–112)
Glucose, Bld: 139 mg/dL — ABNORMAL HIGH (ref 70–99)
POTASSIUM: 3.9 meq/L (ref 3.5–5.3)
Sodium: 137 mEq/L (ref 135–145)

## 2014-06-24 LAB — POCT GLYCOSYLATED HEMOGLOBIN (HGB A1C): Hemoglobin A1C: 7.2

## 2014-06-24 NOTE — Patient Instructions (Signed)
Diabetes, Eating Away From Home Sometimes, you might eat in a restaurant or have meals that are prepared by someone else. You can enjoy eating out. However, the portions in restaurants may be much larger than needed. Listed below are some ideas to help you choose foods that will keep your blood glucose (sugar) in better control.  TIPS FOR EATING OUT  Know your meal plan and how many carbohydrate servings you should have at each meal. You may wish to carry a copy of your meal plan in your purse or wallet. Learn the foods included in each food group.  Make a list of restaurants near you that offer healthy choices. Take a copy of the carry-out menus to see what they offer. Then, you can plan what you will order ahead of time.  Become familiar with serving sizes by practicing them at home using measuring cups and spoons. Once you learn to recognize portion sizes, you will be able to correctly estimate the amount of total carbohydrate you are allowed to eat at the restaurant. Ask for a takeout box if the portion is more than you should have. When your food comes, leave the amount you should have on the plate, and put the rest in the takeout box before you start eating.  Plan ahead if your mealtime will be different from usual. Check with your caregiver to find out how to time meals and medicine if you are taking insulin.  Avoid high-fat foods, such as fried foods, cream sauces, high-fat salad dressings, or any added butter or margarine.  Do not be afraid to ask questions. Ask your server about the portion size, cooking methods, ingredients and if items can be substituted. Restaurants do not list all available items on the menu. You can ask for your main entree to be prepared using skim milk, oil instead of butter or margarine, and without gravy or sauces. Ask your waiter or waitress to serve salad dressings, gravy, sauces, margarine, and sour cream on the side. You can then add the amount your meal plan  suggests.  Add more vegetables whenever possible.  Avoid items that are labeled "jumbo," "giant," "deluxe," or "supersized."  You may want to split an entre with someone and order an extra side salad.  Watch for hidden calories in foods like croutons, bacon, or cheese.  Ask your server to take away the bread basket or chips from your table.  Order a dinner salad as an appetizer. You can eat most foods served in a restaurant. Some foods are better choices than others. Breads and Starches  Recommended: All kinds of bread (wheat, rye, white, oatmeal, [place], [place], raisin), hard or soft dinner rolls, frankfurter or hamburger buns, small bagels, small corn or whole-wheat flour tortillas.  Avoid: Frosted or glazed breads, butter rolls, egg or cheese breads, croissants, sweet rolls, pastries, coffee cake, glazed or frosted doughnuts, muffins. Crackers  Recommended: Animal crackers, graham, rye, saltine, oyster, and matzoth crackers. Bread sticks, melba toast, rusks, pretzels, popcorn (without fat), zwieback toast.  Avoid: High-fat snack crackers or chips. Buttered popcorn. Cereals  Recommended: Hot and cold cereals. Whole grains such as oatmeal or shredded wheat are good choices.  Avoid: Sugar-coated or granola type cereals. Potatoes/Pasta/Rice/Beans  Recommended: Order baked, boiled, or mashed potatoes, rice or noodles without added fat, whole beans. Order gravies, butter, margarine, or sauces on the side so you can control the amount you add.  Avoid: Hash browns or fried potatoes. Potatoes, pasta, or rice prepared with cream or  cheese sauce. Potato or pasta salads prepared with large amounts of dressing. Fried beans or fried rice. Vegetables  Recommended: Order steamed, baked, boiled, or stewed vegetables without sauces or extra fat. Ask that sauce be served on the side. If vegetables are not listed on the menu, ask what is available.  Avoid: Vegetables prepared with cream,  butter, or cheese sauce. Fried vegetables. Salad Bars  Recommended: Many of the vegetables at a salad bar are considered "free." Use lemon juice, vinegar, or low-calorie salad dressing (fewer than 20 calories per serving) as "free" dressings for your salad. Look for salad bar ingredients that have no added fat or sugar such as tomatoes, lettuce, cucumbers, broccoli, carrots, onions, and mushrooms.  Avoid: Prepared salads with large amounts of dressing, such as coleslaw, caesar salad, macaroni salad, bean salad, or carrot salad. Fruit  Recommended: Eat fresh fruit or fresh fruit salad without added dressing. A salad bar often offers fresh fruit choices, but canned fruit at a restaurant is usually packed in sugar or syrup.  Avoid: Sweetened canned or frozen fruits, plain or sweetened fruit juice. Fruit salads with dressing, sour cream, or sugar added to them. Meat and Meat Substitutes  Recommended: Order broiled, baked, roasted, or grilled meat, poultry, or fish. Trim off all visible fat. Do not eat the skin of poultry. The size stated on the menu is the raw weight. Meat shrinks by  in cooking (for example, 4 oz raw equals 3 oz cooked meat).  Avoid: Deep-fat fried meat, poultry, or fish. Breaded meats. Eggs  Recommended: Order soft, hard-cooked, poached, or scrambled eggs. Omelets may be okay, depending on what ingredients are added. Egg substitutes are also a good choice.  Avoid: Fried eggs, eggs prepared with cream or cheese sauce. Milk  Recommended: Order low-fat or fat-free milk according to your meal plan. Plain, nonfat yogurt or flavored yogurt with no sugar added may be used as a substitute for milk. Soy milk may also be used.  Avoid: Milk shakes or sweetened milk beverages. Soups and Combination Foods  Recommended: Clear broth or consomm are "free" foods and may be used as an appetizer. Broth-based soups with fat removed count as a starch serving and are preferred over cream  soups. Soups made with beans or split peas may be eaten but count as a starch.  Avoid: Fatty soups, soup made with cream, cheese soup. Combination foods prepared with excessive amounts of fat or with cream or cheese sauces. Desserts and Sweets  Recommended: Ask for fresh fruit. Sponge or angel food cake without icing, ice milk, no sugar added ice cream, sherbet, or frozen yogurt may fit into your meal plan occasionally.  Avoid: Pastries, puddings, pies, cakes with icing, custard, gelatin desserts. Fats and Oils  Recommended: Choose healthy fats such as olive oil, canola oil, or tub margarine, reduced fat or fat-free sour cream, cream cheese, avocado, or nuts.  Avoid: Any fats in excess of your allowed portion. Deep-fried foods or any food with a large amount of fat. Note: Ask for all fats to be served on the side, and limit your portion sizes according to your meal plan. Document Released: 06/28/2005 Document Revised: 09/20/2011 Document Reviewed: 09/25/2013 [facility] Patient Information 2015 [place], [place]. This information is not intended to replace advice given to you by your health care provider. Make sure you discuss any questions you have with your health care provider. Diabetes Mellitus and Food It is important for you to manage your blood sugar (glucose) level. Your blood  glucose level can be greatly affected by what you eat. Eating healthier foods in the appropriate amounts throughout the day at about the same time each day will help you control your blood glucose level. It can also help slow or prevent worsening of your diabetes mellitus. Healthy eating may even help you improve the level of your blood pressure and reach or maintain a healthy weight.  HOW CAN FOOD AFFECT ME? Carbohydrates Carbohydrates affect your blood glucose level more than any other type of food. Your dietitian will help you determine how many carbohydrates to eat at each meal and teach you how to count  carbohydrates. Counting carbohydrates is important to keep your blood glucose at a healthy level, especially if you are using insulin or taking certain medicines for diabetes mellitus. Alcohol Alcohol can cause sudden decreases in blood glucose (hypoglycemia), especially if you use insulin or take certain medicines for diabetes mellitus. Hypoglycemia can be a life-threatening condition. Symptoms of hypoglycemia (sleepiness, dizziness, and disorientation) are similar to symptoms of having too much alcohol.  If your health care provider has given you approval to drink alcohol, do so in moderation and use the following guidelines:  Women should not have more than one drink per day, and men should not have more than two drinks per day. One drink is equal to:  12 oz of beer.  5 oz of wine.  1 oz of hard liquor.  Do not drink on an empty stomach.  Keep yourself hydrated. Have water, diet soda, or unsweetened iced tea.  Regular soda, juice, and other mixers might contain a lot of carbohydrates and should be counted. WHAT FOODS ARE NOT RECOMMENDED? As you make food choices, it is important to remember that all foods are not the same. Some foods have fewer nutrients per serving than other foods, even though they might have the same number of calories or carbohydrates. It is difficult to get your body what it needs when you eat foods with fewer nutrients. Examples of foods that you should avoid that are high in calories and carbohydrates but low in nutrients include:  Trans fats (most processed foods list trans fats on the Nutrition Facts label).  Regular soda.  Juice.  Candy.  Sweets, such as cake, pie, doughnuts, and cookies.  Fried foods. WHAT FOODS CAN I EAT? Have nutrient-rich foods, which will nourish your body and keep you healthy. The food you should eat also will depend on several factors, including:  The calories you need.  The medicines you take.  Your weight.  Your blood  glucose level.  Your blood pressure level.  Your cholesterol level. You also should eat a variety of foods, including:  Protein, such as meat, poultry, fish, tofu, nuts, and seeds (lean animal proteins are best).  Fruits.  Vegetables.  Dairy products, such as milk, cheese, and yogurt (low fat is best).  Breads, grains, pasta, cereal, rice, and beans.  Fats such as olive oil, trans fat-free margarine, canola oil, avocado, and olives. DOES EVERYONE WITH DIABETES MELLITUS HAVE THE SAME MEAL PLAN? Because every person with diabetes mellitus is different, there is not one meal plan that works for everyone. It is very important that you meet with a dietitian who will help you create a meal plan that is just right for you. Document Released: 03/25/2005 Document Revised: 07/03/2013 Document Reviewed: 05/25/2013 [facility] Patient Information 2015 [place], [place]. This information is not intended to replace advice given to you by your health care provider. Make sure  you discuss any questions you have with your health care provider. Basic Carbohydrate Counting for Diabetes Mellitus Carbohydrate counting is a method for keeping track of the amount of carbohydrates you eat. Eating carbohydrates naturally increases the level of sugar (glucose) in your blood, so it is important for you to know the amount that is okay for you to have in every meal. Carbohydrate counting helps keep the level of glucose in your blood within normal limits. The amount of carbohydrates allowed is different for every person. A dietitian can help you calculate the amount that is right for you. Once you know the amount of carbohydrates you can have, you can count the carbohydrates in the foods you want to eat. Carbohydrates are found in the following foods:  Grains, such as breads and cereals.  Dried beans and soy products.  Starchy vegetables, such as potatoes, peas, and corn.  Fruit and fruit juices.  Milk and  yogurt.  Sweets and snack foods, such as cake, cookies, candy, chips, soft drinks, and fruit drinks. CARBOHYDRATE COUNTING There are two ways to count the carbohydrates in your food. You can use either of the methods or a combination of both. Reading the "Nutrition Facts" on [place] The "Nutrition Facts" is an area that is included on the labels of almost all packaged food and beverages in the [place]. It includes the serving size of that food or beverage and information about the nutrients in each serving of the food, including the grams (g) of carbohydrate per serving.  Decide the number of servings of this food or beverage that you will be able to eat or drink. Multiply that number of servings by the number of grams of carbohydrate that is listed on the label for that serving. The total will be the amount of carbohydrates you will be having when you eat or drink this food or beverage. Learning Standard Serving Sizes of Food When you eat food that is not packaged or does not include "Nutrition Facts" on the label, you need to measure the servings in order to count the amount of carbohydrates.A serving of most carbohydrate-rich foods contains about 15 g of carbohydrates. The following list includes serving sizes of carbohydrate-rich foods that provide 15 g ofcarbohydrate per serving:   1 slice of bread (1 oz) or 1 six-inch tortilla.    of a hamburger bun or English muffin.  4-6 crackers.   cup unsweetened dry cereal.    cup hot cereal.   cup rice or pasta.    cup mashed potatoes or  of a large baked potato.  1 cup fresh fruit or one small piece of fruit.    cup canned or frozen fruit or fruit juice.  1 cup milk.   cup plain fat-free yogurt or yogurt sweetened with artificial sweeteners.   cup cooked dried beans or starchy vegetable, such as peas, corn, or potatoes.  Decide the number of standard-size servings that you will eat. Multiply that number of  servings by 15 (the grams of carbohydrates in that serving). For example, if you eat 2 cups of strawberries, you will have eaten 2 servings and 30 g of carbohydrates (2 servings x 15 g = 30 g). For foods such as soups and casseroles, in which more than one food is mixed in, you will need to count the carbohydrates in each food that is included. EXAMPLE OF CARBOHYDRATE COUNTING Sample Dinner  3 oz chicken breast.   cup of brown rice.   cup  of corn.  1 cup milk.   1 cup strawberries with sugar-free whipped topping.  Carbohydrate Calculation Step 1: Identify the foods that contain carbohydrates:   Rice.   Corn.   Milk.   Strawberries. Step 2:Calculate the number of servings eaten of each:   2 servings of rice.   1 serving of corn.   1 serving of milk.   1 serving of strawberries. Step 3: Multiply each of those number of servings by 15 g:   2 servings of rice x 15 g = 30 g.   1 serving of corn x 15 g = 15 g.   1 serving of milk x 15 g = 15 g.   1 serving of strawberries x 15 g = 15 g. Step 4: Add together all of the amounts to find the total grams of carbohydrates eaten: 30 g + 15 g + 15 g + 15 g = 75 g. Document Released: 06/28/2005 Document Revised: 11/12/2013 Document Reviewed: 05/25/2013 [facility] Patient Information 2015 [place], [place]. This information is not intended to replace advice given to you by your health care provider. Make sure you discuss any questions you have with your health care provider.

## 2014-06-24 NOTE — Progress Notes (Signed)
   Subjective:    Patient ID: Adam Reid, male    DOB: 22-Jul-1945, 68 y.o.   MRN: [id number]  HPI Speaks no [place], family interprets. Has htn, high cholesterol, ckd, hx of gout, hx of thyroid issues, creatinine rose to 1.8 in October and needs repeat, glucose mildly elevated last few readings. He feels good, he has not had colonoscopy as ordered, risks discussed. Also has BPH on prazosin just mentioned today. Last week had synthroid and allopurinol stopped with no gout attacks.Off allopurinol uric acid 6.2 TSH will be done today    Review of Systems     Objective:   Physical Exam  Constitutional: He is oriented to person, place, and time. He appears well-developed and well-nourished. No distress.  HENT:  Head: Normocephalic.  Eyes: EOM are normal. No scleral icterus.  Neck: Normal range of motion. No thyromegaly present.  Cardiovascular: Normal rate, regular rhythm and normal heart sounds.   Pulmonary/Chest: Effort normal and breath sounds normal.  Musculoskeletal: He exhibits no edema.  Neurological: He is alert and oriented to person, place, and time. No cranial nerve deficit. He exhibits normal muscle tone. Coordination normal.  Psychiatric: He has a normal mood and affect. His behavior is normal. Thought content normal.   Results for orders placed or performed in visit on 06/24/14  POCT glucose (manual entry)  Result Value Ref Range   POC Glucose 136 (A) 70 - 99 mg/dl  POCT glycosylated hemoglobin (Hb A1C)  Result Value Ref Range   Hemoglobin A1C 7.2           Assessment & Plan:  HTN with ckd BPH with PSA of 3.7 follow with repeat 3 months New mild T2DM/Refer to nutrition counseling New elevated creatinine 1.8--repeat today, careful with metformin Must have colonocopy RTC Friday review labs and consider diabetes txment

## 2014-06-25 ENCOUNTER — Encounter: Payer: Self-pay | Admitting: Internal Medicine

## 2014-06-25 LAB — TSH: TSH: 3.674 u[IU]/mL (ref 0.350–4.500)

## 2014-06-28 ENCOUNTER — Ambulatory Visit (INDEPENDENT_AMBULATORY_CARE_PROVIDER_SITE_OTHER): Payer: Medicare Other | Admitting: Internal Medicine

## 2014-06-28 VITALS — BP 110/72 | HR 60 | Temp 98.5°F | Resp 18 | Ht 67.0 in | Wt 160.0 lb

## 2014-06-28 DIAGNOSIS — E1169 Type 2 diabetes mellitus with other specified complication: Secondary | ICD-10-CM | POA: Insufficient documentation

## 2014-06-28 DIAGNOSIS — I10 Essential (primary) hypertension: Secondary | ICD-10-CM | POA: Diagnosis not present

## 2014-06-28 DIAGNOSIS — E785 Hyperlipidemia, unspecified: Secondary | ICD-10-CM

## 2014-06-28 DIAGNOSIS — E1129 Type 2 diabetes mellitus with other diabetic kidney complication: Secondary | ICD-10-CM | POA: Insufficient documentation

## 2014-06-28 DIAGNOSIS — N189 Chronic kidney disease, unspecified: Secondary | ICD-10-CM | POA: Diagnosis not present

## 2014-06-28 DIAGNOSIS — I639 Cerebral infarction, unspecified: Secondary | ICD-10-CM

## 2014-06-28 DIAGNOSIS — E1121 Type 2 diabetes mellitus with diabetic nephropathy: Secondary | ICD-10-CM | POA: Diagnosis not present

## 2014-06-28 LAB — GLUCOSE, POCT (MANUAL RESULT ENTRY): POC Glucose: 158 mg/dl — AB (ref 70–99)

## 2014-06-28 MED ORDER — GLIPIZIDE 2.5 MG HALF TABLET: 2.5000 mg | ORAL_TABLET | Freq: Every day | ORAL | Status: DC

## 2014-06-28 NOTE — Progress Notes (Signed)
   Subjective:    Patient ID: Adam Reid, male    DOB: 12-12-1945, 68 y.o.   MRN: [id number]  HPI Here for review of lab work, further evaluation of T2D and CKD, speaks no [place]. TSH remains normal off thyroid meds, creatine came down to 1.47. He needs kidney evaluation and also referral to endocrinology to evaluate best treatment for newly diagnosed T2D.   Review of Systems     Objective:   Physical Exam  Constitutional: He is oriented to person, place, and time. He appears well-developed and well-nourished. No distress.  HENT:  Head: Normocephalic.  Nose: Nose normal.  Eyes: EOM are normal. Pupils are equal, round, and reactive to light. No scleral icterus.  Neck: Normal range of motion.  Cardiovascular: Normal rate.   Pulmonary/Chest: Effort normal and breath sounds normal.  Neurological: He is alert and oriented to person, place, and time. He exhibits normal muscle tone. Coordination normal.  Psychiatric: He has a normal mood and affect.   Results for orders placed or performed in visit on 06/28/14  POCT glucose (manual entry)  Result Value Ref Range   POC Glucose 158 (A) 70 - 99 mg/dl          Assessment & Plan:  Refer to endocrinology and nephrology this month Needs primary care at 89 or another group Start glipizide 2.5 po q am

## 2014-07-23 DIAGNOSIS — Z23 Encounter for immunization: Secondary | ICD-10-CM | POA: Diagnosis not present

## 2014-07-26 ENCOUNTER — Other Ambulatory Visit: Payer: Self-pay | Admitting: Internal Medicine

## 2014-07-31 ENCOUNTER — Encounter: Payer: Self-pay | Admitting: *Deleted

## 2014-07-31 ENCOUNTER — Encounter: Payer: Medicare Other | Attending: Internal Medicine | Admitting: *Deleted

## 2014-07-31 VITALS — Ht 65.75 in | Wt 158.5 lb

## 2014-07-31 DIAGNOSIS — E119 Type 2 diabetes mellitus without complications: Secondary | ICD-10-CM | POA: Diagnosis not present

## 2014-07-31 DIAGNOSIS — Z713 Dietary counseling and surveillance: Secondary | ICD-10-CM | POA: Insufficient documentation

## 2014-07-31 NOTE — Patient Instructions (Signed)
Plan:  Aim for 3 Carb Choices per meal (45 grams) +/- 1 either way  Include protein in moderation with your meals and snacks Consider reading food labels for Total Carbohydrate of foods Continue with your activity level by walking or going to gym daily as tolerated Consider checking Blood Glucose at alternate times per day and ask MD for Rx for meter and strips

## 2014-07-31 NOTE — Progress Notes (Signed)
Time of visit was 1500 to 1700 with interpretor time included

## 2014-07-31 NOTE — Progress Notes (Signed)
  Medical Nutrition Therapy:  Appt start time: 1500 end time:  1630.  Assessment:  Primary concerns today: 07/31/14. Here with his wife and interpretor. He states history of Diabetes for about 1 year. He does not work now. He has recently started  going to the gym every other day and walks every day when the weather is good. He is on computer during the day. He shops for the food, his wife cooks. He does not have a meter yet.    Preferred Learning Style:   No preference indicated   Learning Readiness:   Ready  Change in progress  MEDICATIONS: see list. Diabetes med is Glipizide which he takes every day before breakfast   DIETARY INTAKE:  24-hr recall:  B (10 AM): tea with Splenda, 2 tortilla type of wheat bread and cheese & honey, milk  Snk ( AM): no  L ( PM): later in day Snk ( PM): no D (5 PM): rice, meat & various vegetables in a stew, bread of no rice, water or diet soda Snk ( PM): occasionally fresh fruit OR a dessert occasionally Beverages: hot tea, water, diet soda, 2 %milk,   Usual physical activity:  gym every other day and walks every day when the weather is good   Estimated energy needs: 1400 calories 158 g carbohydrates 105 g protein 39 g fat  Progress Towards Goal(s):  In progress.   Nutritional Diagnosis:  NB-1.1 Food and nutrition-related knowledge deficit As related to Diabetes.  As evidenced by A1c of 7.2%.    Intervention:  Nutrition counseling and diabetes education initiated. Discussed Carb Counting by food group as method of portion control, reading food labels, and benefits of increased activity. Also discussed basic physiology of Diabetes, Self Monitoring of blood glucose and relationship to A1c target. Due to Glipizide medication I discussed causes, symptoms and treatment for hypoglycemia.  Teaching Method Utilized:  Visual Auditory Hands on  Handouts given during visit include: Living Well with Diabetes Carb Counting and Food Label  handouts Meal Plan Card  Barriers to learning/adherence to lifestyle change: language   Demonstrated degree of understanding via:  Teach Back   Monitoring/Evaluation:  Dietary intake, exercise, reading food labels, and body weight prn.

## 2014-09-20 ENCOUNTER — Ambulatory Visit (INDEPENDENT_AMBULATORY_CARE_PROVIDER_SITE_OTHER): Payer: Medicare Other | Admitting: Internal Medicine

## 2014-09-20 VITALS — BP 126/80 | HR 85 | Temp 98.0°F | Resp 16 | Ht 66.0 in | Wt 158.6 lb

## 2014-09-20 DIAGNOSIS — Z603 Acculturation difficulty: Secondary | ICD-10-CM

## 2014-09-20 DIAGNOSIS — N189 Chronic kidney disease, unspecified: Secondary | ICD-10-CM | POA: Diagnosis not present

## 2014-09-20 DIAGNOSIS — I10 Essential (primary) hypertension: Secondary | ICD-10-CM

## 2014-09-20 DIAGNOSIS — E118 Type 2 diabetes mellitus with unspecified complications: Secondary | ICD-10-CM | POA: Diagnosis not present

## 2014-09-20 DIAGNOSIS — Z789 Other specified health status: Secondary | ICD-10-CM

## 2014-09-20 DIAGNOSIS — Z79899 Other long term (current) drug therapy: Secondary | ICD-10-CM | POA: Diagnosis not present

## 2014-09-20 LAB — GLUCOSE, POCT (MANUAL RESULT ENTRY): POC GLUCOSE: 120 mg/dL — AB (ref 70–99)

## 2014-09-20 LAB — BASIC METABOLIC PANEL
BUN: 33 mg/dL — AB (ref 6–23)
CO2: 28 mEq/L (ref 19–32)
Calcium: 9.5 mg/dL (ref 8.4–10.5)
Chloride: 95 mEq/L — ABNORMAL LOW (ref 96–112)
Creat: 1.71 mg/dL — ABNORMAL HIGH (ref 0.50–1.35)
GLUCOSE: 119 mg/dL — AB (ref 70–99)
Potassium: 3.3 mEq/L — ABNORMAL LOW (ref 3.5–5.3)
Sodium: 137 mEq/L (ref 135–145)

## 2014-09-20 LAB — POCT GLYCOSYLATED HEMOGLOBIN (HGB A1C): Hemoglobin A1C: 6.7

## 2014-09-20 MED ORDER — AMLODIPINE BESYLATE 5 MG PO TABS: 5.0000 mg | ORAL_TABLET | Freq: Every day | ORAL | Status: DC

## 2014-09-20 MED ORDER — ATORVASTATIN CALCIUM 10 MG PO TABS: 10.0000 mg | ORAL_TABLET | Freq: Every day | ORAL | Status: DC

## 2014-09-20 NOTE — Patient Instructions (Signed)
Atorvastatin tablets What is this medicine? ATORVASTATIN (a TORE va sta tin) is known as a HMG-CoA reductase inhibitor or 'statin'. It lowers the level of cholesterol and triglycerides in the blood. This drug may also reduce the risk of heart attack, stroke, or other health problems in patients with risk factors for heart disease. Diet and lifestyle changes are often used with this drug. This medicine may be used for other purposes; ask your health care provider or pharmacist if you have questions. COMMON BRAND NAME(S): Lipitor What should I tell my health care provider before I take this medicine? They need to know if you have any of these conditions: -frequently drink alcoholic beverages -history of stroke, TIA -kidney disease -liver disease -muscle aches or weakness -other medical condition -an unusual or allergic reaction to atorvastatin, other medicines, foods, dyes, or preservatives -pregnant or trying to get pregnant -breast-feeding How should I use this medicine? Take this medicine by mouth with a glass of water. Follow the directions on the prescription label. You can take this medicine with or without food. Take your doses at regular intervals. Do not take your medicine more often than directed. Talk to your pediatrician regarding the use of this medicine in children. While this drug may be prescribed for children as young as 44 years old for selected conditions, precautions do apply. Overdosage: If you think you have taken too much of this medicine contact a poison control center or emergency room at once. NOTE: This medicine is only for you. Do not share this medicine with others. What if I miss a dose? If you miss a dose, take it as soon as you can. If it is almost time for your next dose, take only that dose. Do not take double or extra doses. What may interact with this medicine? Do not take this medicine with any of the following medications: -red yeast  rice -telaprevir -telithromycin -voriconazole This medicine may also interact with the following medications: -alcohol -antiviral medicines for HIV or AIDS -boceprevir -certain antibiotics like clarithromycin, erythromycin, troleandomycin -certain medicines for cholesterol like fenofibrate or gemfibrozil -cimetidine -clarithromycin -colchicine -cyclosporine -digoxin -male hormones, like estrogens or progestins and birth control pills -grapefruit juice -medicines for fungal infections like fluconazole, itraconazole, ketoconazole -niacin -rifampin -spironolactone This list may not describe all possible interactions. Give your health care provider a list of all the medicines, herbs, non-prescription drugs, or dietary supplements you use. Also tell them if you smoke, drink alcohol, or use illegal drugs. Some items may interact with your medicine. What should I watch for while using this medicine? Visit your doctor or health care professional for regular check-ups. You may need regular tests to make sure your liver is working properly. Tell your doctor or health care professional right away if you get any unexplained muscle pain, tenderness, or weakness, especially if you also have a fever and tiredness. Your doctor or health care professional may tell you to stop taking this medicine if you develop muscle problems. If your muscle problems do not go away after stopping this medicine, contact your health care professional. This drug is only part of a total heart-health program. Your doctor or a dietician can suggest a low-cholesterol and low-fat diet to help. Avoid alcohol and smoking, and keep a proper exercise schedule. Do not use this drug if you are pregnant or breast-feeding. Serious side effects to an unborn child or to an infant are possible. Talk to your doctor or pharmacist for more information. This medicine may affect  blood sugar levels. If you have diabetes, check with your doctor  or health care professional before you change your diet or the dose of your diabetic medicine. If you are going to have surgery tell your health care professional that you are taking this drug. What side effects may I notice from receiving this medicine? Side effects that you should report to your doctor or health care professional as soon as possible: -allergic reactions like skin rash, itching or hives, swelling of the face, lips, or tongue -dark urine -fever -joint pain -muscle cramps, pain -redness, blistering, peeling or loosening of the skin, including inside the mouth -trouble passing urine or change in the amount of urine -unusually weak or tired -yellowing of eyes or skin Side effects that usually do not require medical attention (report to your doctor or health care professional if they continue or are bothersome): -constipation -heartburn -stomach gas, pain, upset This list may not describe all possible side effects. Call your doctor for medical advice about side effects. You may report side effects to FDA at 1-800-FDA-1088. Where should I keep my medicine? Keep out of the reach of children. Store at room temperature between 20 to 25 degrees C (68 to 77 degrees F). Throw away any unused medicine after the expiration date. NOTE: This sheet is a summary. It may not cover all possible information. If you have questions about this medicine, talk to your doctor, pharmacist, or health care provider.  2015, Elsevier/Gold Standard. (2011-05-18 [id number]) Cholesterol Cholesterol is a white, waxy, fat-like substance needed by your body in small amounts. The liver makes all the cholesterol you need. Cholesterol is carried from the liver by the blood through the blood vessels. Deposits of cholesterol (plaque) may build up on blood vessel walls. These make the arteries narrower and stiffer. Cholesterol plaques increase the risk for heart attack and stroke.  You cannot feel your cholesterol  level even if it is very high. The only way to know it is high is with a blood test. Once you know your cholesterol levels, you should keep a record of the test results. Work with your health care provider to keep your levels in the desired range.  WHAT DO THE RESULTS MEAN?  Total cholesterol is a rough measure of all the cholesterol in your blood.   LDL is the so-called bad cholesterol. This is the type that deposits cholesterol in the walls of the arteries. You want this level to be low.   HDL is the good cholesterol because it cleans the arteries and carries the LDL away. You want this level to be high.  Triglycerides are fat that the body can either burn for energy or store. High levels are closely linked to heart disease.  WHAT ARE THE DESIRED LEVELS OF CHOLESTEROL?  Total cholesterol below 200.   LDL below 100 for people at risk, below 70 for those at very high risk.   HDL above 50 is good, above 60 is best.   Triglycerides below 150.  HOW CAN I LOWER MY CHOLESTEROL?  Diet. Follow your diet programs as directed by your health care provider.   Choose fish or white meat chicken and [place], roasted or baked. Limit fatty cuts of red meat, fried foods, and processed meats, such as sausage and lunch meats.   Eat lots of fresh fruits and vegetables.  Choose whole grains, beans, pasta, potatoes, and cereals.   Use only small amounts of olive, corn, or canola oils.   Avoid butter, mayonnaise,  shortening, or palm kernel oils.  Avoid foods with trans fats.   Drink skim or nonfat milk and eat low-fat or nonfat yogurt and cheeses. Avoid whole milk, cream, ice cream, egg yolks, and full-fat cheeses.   Healthy desserts include angel food cake, ginger snaps, animal crackers, hard candy, popsicles, and low-fat or nonfat frozen yogurt. Avoid pastries, cakes, pies, and cookies.   Exercise. Follow your exercise programs as directed by your health care provider.   A regular  program helps decrease LDL and raise HDL.   A regular program helps with weight control.   Do things that increase your activity level like gardening, walking, or taking the stairs. Ask your health care provider about how you can be more active in your daily life.   Medicine. Take medicine only as directed by your health care provider.   Medicine may be prescribed by your health care provider to help lower cholesterol and decrease the risk for heart disease.   If you have several risk factors, you may need medicine even if your levels are normal. Document Released: 03/23/2001 Document Revised: 11/12/2013 Document Reviewed: 04/11/2013 [facility] Patient Information 2015 [place], [place]. This information is not intended to replace advice given to you by your health care provider. Make sure you discuss any questions you have with your health care provider.

## 2014-09-20 NOTE — Progress Notes (Signed)
   Subjective:    Patient ID: Adam Reid, male    DOB: 1946/06/14, 69 y.o.   MRN: [id number]  HPI I, Adam Ser R.T. (R), am scribing for Dr. [name]. Adam Reid is a 69 y.o male who comes into clinic today with concerns of his BP being too low. He has been getting numbers of 80 to 90. He has made it to the dietician, but has not been able to get into Nephrology or Endo. He also wantsto check on his diabetes to see if the Glipizide is effective.  Edited by dr. [name]. He has mild T2DM, HTN on 4 meds, CKD, hypercholesterolemia un treated.  I referred him to endocrinology and nephrology last October. Also referred to 104 for ongoing care. There is a communication barrier. Interpretor here today. None of the appointments have occurred. Has had dizzy spells , bp low at times systolic of 0000000. Review of Systems     Objective:   Physical Exam  Constitutional: He is oriented to person, place, and time. He appears well-developed and well-nourished.  HENT:  Head: Normocephalic.  Nose: Nose normal.  Eyes: Conjunctivae and EOM are normal. Pupils are equal, round, and reactive to light. No scleral icterus.  Neck: Normal range of motion. Neck supple.  Cardiovascular: Normal rate, regular rhythm and normal heart sounds.   Pulmonary/Chest: Effort normal and breath sounds normal.  Musculoskeletal: Normal range of motion.  Neurological: He is alert and oriented to person, place, and time. He exhibits normal muscle tone. Coordination normal.  Psychiatric: He has a normal mood and affect. His behavior is normal. Thought content normal.    BP 126/80 on 4 meds.  Results for orders placed or performed in visit on 09/20/14  POCT glucose (manual entry)  Result Value Ref Range   POC Glucose 120 (A) 70 - 99 mg/dl  POCT glycosylated hemoglobin (Hb A1C)  Result Value Ref Range   Hemoglobin A1C 6.7         Assessment & Plan:  Adjust BP meds/ ewill stop HCTZ and reduce amlodipine to 5mg   qd Refer to endocrine Dr. Dwyane Reid who may speak his language Refer to nephrology for CKD and BP opinion Add lipitor 10mg  qd

## 2014-09-23 ENCOUNTER — Other Ambulatory Visit: Payer: Self-pay | Admitting: Internal Medicine

## 2014-09-25 ENCOUNTER — Encounter: Payer: Self-pay | Admitting: Family Medicine

## 2014-10-17 ENCOUNTER — Ambulatory Visit
Admission: RE | Admit: 2014-10-17 | Discharge: 2014-10-17 | Disposition: A | Discharge status: Home / Self Care | Payer: Medicare Other | Source: Ambulatory Visit | Attending: Nephrology | Admitting: Nephrology

## 2014-10-17 ENCOUNTER — Other Ambulatory Visit: Payer: Self-pay | Admitting: Nephrology

## 2014-10-17 DIAGNOSIS — I129 Hypertensive chronic kidney disease with stage 1 through stage 4 chronic kidney disease, or unspecified chronic kidney disease: Secondary | ICD-10-CM

## 2014-10-17 DIAGNOSIS — N189 Chronic kidney disease, unspecified: Secondary | ICD-10-CM | POA: Diagnosis not present

## 2014-10-17 DIAGNOSIS — I10 Essential (primary) hypertension: Secondary | ICD-10-CM | POA: Diagnosis not present

## 2014-10-17 DIAGNOSIS — E1129 Type 2 diabetes mellitus with other diabetic kidney complication: Secondary | ICD-10-CM | POA: Diagnosis not present

## 2014-10-17 DIAGNOSIS — E780 Pure hypercholesterolemia: Secondary | ICD-10-CM | POA: Diagnosis not present

## 2014-12-05 DIAGNOSIS — I129 Hypertensive chronic kidney disease with stage 1 through stage 4 chronic kidney disease, or unspecified chronic kidney disease: Secondary | ICD-10-CM | POA: Diagnosis not present

## 2014-12-05 DIAGNOSIS — E780 Pure hypercholesterolemia: Secondary | ICD-10-CM | POA: Diagnosis not present

## 2014-12-05 DIAGNOSIS — E1129 Type 2 diabetes mellitus with other diabetic kidney complication: Secondary | ICD-10-CM | POA: Diagnosis not present

## 2015-01-30 ENCOUNTER — Other Ambulatory Visit: Payer: Self-pay | Admitting: Internal Medicine

## 2015-03-01 ENCOUNTER — Other Ambulatory Visit: Payer: Self-pay | Admitting: Internal Medicine

## 2015-03-23 ENCOUNTER — Ambulatory Visit (INDEPENDENT_AMBULATORY_CARE_PROVIDER_SITE_OTHER): Payer: Medicare Other | Admitting: Internal Medicine

## 2015-03-23 VITALS — BP 130/82 | HR 63 | Temp 97.4°F | Resp 18 | Ht 66.5 in | Wt 153.0 lb

## 2015-03-23 DIAGNOSIS — R479 Unspecified speech disturbances: Secondary | ICD-10-CM | POA: Diagnosis not present

## 2015-03-23 DIAGNOSIS — F809 Developmental disorder of speech and language, unspecified: Secondary | ICD-10-CM

## 2015-03-23 DIAGNOSIS — I150 Renovascular hypertension: Secondary | ICD-10-CM

## 2015-03-23 DIAGNOSIS — E1122 Type 2 diabetes mellitus with diabetic chronic kidney disease: Secondary | ICD-10-CM | POA: Diagnosis not present

## 2015-03-23 DIAGNOSIS — N189 Chronic kidney disease, unspecified: Secondary | ICD-10-CM | POA: Diagnosis not present

## 2015-03-23 DIAGNOSIS — R634 Abnormal weight loss: Secondary | ICD-10-CM | POA: Diagnosis not present

## 2015-03-23 DIAGNOSIS — R972 Elevated prostate specific antigen [PSA]: Secondary | ICD-10-CM

## 2015-03-23 DIAGNOSIS — E1129 Type 2 diabetes mellitus with other diabetic kidney complication: Secondary | ICD-10-CM | POA: Diagnosis not present

## 2015-03-23 LAB — POCT URINALYSIS DIPSTICK
Bilirubin, UA: NEGATIVE
Glucose, UA: 100
KETONES UA: NEGATIVE
Leukocytes, UA: NEGATIVE
Nitrite, UA: NEGATIVE
PH UA: 5.5
PROTEIN UA: 100
SPEC GRAV UA: 1.02
UROBILINOGEN UA: 0.2

## 2015-03-23 LAB — POCT CBC
Granulocyte percent: 74.2 %G (ref 37–80)
HCT, POC: 44.1 % (ref 43.5–53.7)
Hemoglobin: 14 g/dL — AB (ref 14.1–18.1)
Lymph, poc: 1.8 (ref 0.6–3.4)
MCH: 26.8 pg — AB (ref 27–31.2)
MCHC: 31.9 g/dL (ref 31.8–35.4)
MCV: 84.1 fL (ref 80–97)
MID (CBC): 0.6 (ref 0–0.9)
MPV: 8.1 fL (ref 0–99.8)
POC GRANULOCYTE: 7 — AB (ref 2–6.9)
POC LYMPH PERCENT: 19 %L (ref 10–50)
POC MID %: 6.8 % (ref 0–12)
Platelet Count, POC: 268 10*3/uL (ref 142–424)
RBC: 5.24 M/uL (ref 4.69–6.13)
RDW, POC: 13.7 %
WBC: 9.5 10*3/uL (ref 4.6–10.2)

## 2015-03-23 LAB — POCT GLYCOSYLATED HEMOGLOBIN (HGB A1C): Hemoglobin A1C: 7.3

## 2015-03-23 LAB — POCT UA - MICROSCOPIC ONLY
Bacteria, U Microscopic: NEGATIVE
CRYSTALS, UR, HPF, POC: NEGATIVE
Casts, Ur, LPF, POC: NEGATIVE
Mucus, UA: POSITIVE
YEAST UA: NEGATIVE

## 2015-03-23 LAB — GLUCOSE, POCT (MANUAL RESULT ENTRY): POC Glucose: 155 mg/dl — AB (ref 70–99)

## 2015-03-23 MED ORDER — GLIPIZIDE 5 MG PO TABS: ORAL_TABLET | ORAL | Status: DC

## 2015-03-23 NOTE — Patient Instructions (Signed)
Chronic Kidney Disease Chronic kidney disease occurs when the kidneys are damaged over a long period. The kidneys are two organs that lie on either side of the spine between the middle of the back and the front of the abdomen. The kidneys:   Remove wastes and extra water from the blood.   Produce important hormones. These help keep bones strong, regulate blood pressure, and help create red blood cells.   Balance the fluids and chemicals in the blood and tissues. A small amount of kidney damage may not cause problems, but a large amount of damage may make it difficult or impossible for the kidneys to work the way they should. If steps are not taken to slow down the kidney damage or stop it from getting worse, the kidneys may stop working permanently. Most of the time, chronic kidney disease does not go away. However, it can often be controlled, and those with the disease can usually live normal lives. CAUSES  The most common causes of chronic kidney disease are diabetes and high blood pressure (hypertension). Chronic kidney disease may also be caused by:   Diseases that cause the kidneys' filters to become inflamed.   Diseases that affect the immune system.   Genetic diseases.   Medicines that damage the kidneys, such as anti-inflammatory medicines.  Poisoning or exposure to toxic substances.   A reoccurring kidney or urinary infection.   A problem with urine flow. This may be caused by:   Cancer.   Kidney stones.   An enlarged prostate in males. SIGNS AND SYMPTOMS  Because the kidney damage in chronic kidney disease occurs slowly, symptoms develop slowly and may not be obvious until the kidney damage becomes severe. A person may have a kidney disease for years without showing any symptoms. Symptoms can include:   Swelling (edema) of the legs, ankles, or feet.   Tiredness (lethargy).   Nausea or vomiting.   Confusion.   Problems with urination, such as:    Decreased urine production.   Frequent urination, especially at night.   Frequent accidents in children who are potty trained.   Muscle twitches and cramps.   Shortness of breath.  Weakness.   Persistent itchiness.   Loss of appetite.  Metallic taste in the mouth.  Trouble sleeping.  Slowed development in children.  Short stature in children. DIAGNOSIS  Chronic kidney disease may be detected and diagnosed by tests, including blood, urine, imaging, or kidney biopsy tests.  TREATMENT  Most chronic kidney diseases cannot be cured. Treatment usually involves relieving symptoms and preventing or slowing the progression of the disease. Treatment may include:   A special diet. You may need to avoid alcohol and foods thatare salty and high in potassium.   Medicines. These may:   Lower blood pressure.   Relieve anemia.   Relieve swelling.   Protect the bones. HOME CARE INSTRUCTIONS   Follow your prescribed diet.   Take medicines only as directed by your health care provider. Do not take any new medicines (prescription, over-the-counter, or nutritional supplements) unless approved by your health care provider. Many medicines can worsen your kidney damage or need to have the dose adjusted.   Quit smoking if you smoke. Talk to your health care provider about a smoking cessation program.   Keep all follow-up visits as directed by your health care provider. SEEK IMMEDIATE MEDICAL CARE IF:  Your symptoms get worse or you develop new symptoms.   You develop symptoms of end-stage kidney disease. These  include:   Headaches.   Abnormally dark or light skin.   Numbness in the hands or feet.   Easy bruising.   Frequent hiccups.   Menstruation stops.   You have a fever.   You have decreased urine production.   You havepain or bleeding when urinating. MAKE SURE YOU:  Understand these instructions.  Will watch your condition.  Will  get help right away if you are not doing well or get worse. FOR MORE INFORMATION   American Association of Kidney Patients: BombTimer.gl  National Kidney Foundation: www.kidney.[place]: https://mathis.com/  [facility]: www.lifeoptions.org and www.kidneyschool.org Document Released: 04/06/2008 Document Revised: 11/12/2013 Document Reviewed: 02/25/2012 [facility] Patient Information 2015 [place], [place]. This information is not intended to replace advice given to you by your health care provider. Make sure you discuss any questions you have with your health care provider. Diabetes and Exercise Exercising regularly is important. It is not just about losing weight. It has many health benefits, such as:  Improving your overall fitness, flexibility, and endurance.  Increasing your bone density.  Helping with weight control.  Decreasing your body fat.  Increasing your muscle strength.  Reducing stress and tension.  Improving your overall health. People with diabetes who exercise gain additional benefits because exercise:  Reduces appetite.  Improves the body's use of blood sugar (glucose).  Helps lower or control blood glucose.  Decreases blood pressure.  Helps control blood lipids (such as cholesterol and triglycerides).  Improves the body's use of the hormone insulin by:  Increasing the body's insulin sensitivity.  Reducing the body's insulin needs.  Decreases the risk for heart disease because exercising:  Lowers cholesterol and triglycerides levels.  Increases the levels of good cholesterol (such as high-density lipoproteins [HDL]) in the body.  Lowers blood glucose levels. YOUR ACTIVITY PLAN  Choose an activity that you enjoy and set realistic goals. Your health care provider or diabetes educator can help you make an activity plan that works for you. Exercise regularly as directed by your health care provider. This  includes:  Performing resistance training twice a week such as push-ups, sit-ups, lifting weights, or using resistance bands.  Performing 150 minutes of cardio exercises each week such as walking, running, or playing sports.  Staying active and spending no more than 90 minutes at one time being inactive. Even short bursts of exercise are good for you. Three 10-minute sessions spread throughout the day are just as beneficial as a single 30-minute session. Some exercise ideas include:  Taking the dog for a walk.  Taking the stairs instead of the elevator.  Dancing to your favorite song.  Doing an exercise video.  Doing your favorite exercise with a friend. RECOMMENDATIONS FOR EXERCISING WITH TYPE 1 OR TYPE 2 DIABETES   Check your blood glucose before exercising. If blood glucose levels are greater than 240 mg/dL, check for urine ketones. Do not exercise if ketones are present.  Avoid injecting insulin into areas of the body that are going to be exercised. For example, avoid injecting insulin into:  The arms when playing tennis.  The legs when jogging.  Keep a record of:  Food intake before and after you exercise.  Expected peak times of insulin action.  Blood glucose levels before and after you exercise.  The type and amount of exercise you have done.  Review your records with your health care provider. Your health care provider will help you to develop guidelines for adjusting food intake and  insulin amounts before and after exercising.  If you take insulin or oral hypoglycemic agents, watch for signs and symptoms of hypoglycemia. They include:  Dizziness.  Shaking.  Sweating.  Chills.  Confusion.  Drink plenty of water while you exercise to prevent dehydration or heat stroke. Body water is lost during exercise and must be replaced.  Talk to your health care provider before starting an exercise program to make sure it is safe for you. Remember, almost any type of  activity is better than none. Document Released: 09/18/2003 Document Revised: 11/12/2013 Document Reviewed: 12/05/2012 [facility] Patient Information 2015 [place], [place]. This information is not intended to replace advice given to you by your health care provider. Make sure you discuss any questions you have with your health care provider.

## 2015-03-23 NOTE — Progress Notes (Signed)
Patient ID: Adam Reid, male   DOB: [birth year], 69 y.o.   MRN: [id number]   03/23/2015 at 2:18 PM  Adam Reid / DOB: 1945/12/01 / MRN: [id number]  Problem list reviewed and updated by me where necessary.   SUBJECTIVE  Adam Reid is a 69 y.o. well appearing male presenting for the chief complaint of need refills of medication, needs blood work done, he did see nephrology and has chronic kidney disease, his meds not changed, has stable T2DM. He did not go for scheduled colonoscopy but agreed to today. He agrees to establish with new primary care here soon..     He  has a past medical history of Thyroid disease; Hypertension; Diabetes mellitus without complication; Chronic kidney disease; and Hyperlipidemia.    Medications reviewed and updated by myself where necessary, and exist elsewhere in the encounter.   Adam Reid has No Known Allergies. He  reports that he has been smoking Cigarettes.  He has a 14.4 pack-year smoking history. He does not have any smokeless tobacco history on file. He reports that he does not drink alcohol or use illicit drugs. He  has no sexual activity history on file. The patient  has past surgical history that includes Appendectomy.  His family history includes Hypertension in his mother and sister.  Review of Systems  Constitutional: Negative for fever.  Respiratory: Negative for shortness of breath.   Cardiovascular: Negative for chest pain.  Gastrointestinal: Negative for nausea.  Skin: Negative for rash.  Neurological: Negative for dizziness and headaches.    OBJECTIVE  His  height is 5' 6.5" (1.689 m) and weight is 153 lb (69.4 kg). His oral temperature is 97.4 F (36.3 C). His blood pressure is 130/82 and his pulse is 63. His respiration is 18 and oxygen saturation is 98%.  The patient's body mass index is 24.33 kg/(m^2).  Physical Exam  Constitutional: He is oriented to person, place, and time. He appears well-developed and  well-nourished. No distress.  HENT:  Head: Normocephalic.  Right Ear: External ear normal.  Left Ear: External ear normal.  Nose: Nose normal.  Eyes: Conjunctivae and EOM are normal. Pupils are equal, round, and reactive to light.  Neck: Normal range of motion. Neck supple.  Cardiovascular: Normal rate, regular rhythm and normal heart sounds.   No murmur heard. Respiratory: Effort normal and breath sounds normal.  GI: Soft. Bowel sounds are normal. He exhibits no distension and no mass. There is no tenderness. There is no rebound and no guarding.  Musculoskeletal: He exhibits tenderness.  Neurological: He is alert and oriented to person, place, and time. He exhibits normal muscle tone. Coordination normal.  Psychiatric: He has a normal mood and affect.    Results for orders placed or performed in visit on 03/23/15 (from the past 24 hour(s))  POCT CBC     Status: Abnormal   Collection Time: 03/23/15  2:04 PM  Result Value Ref Range   WBC 9.5 4.6 - 10.2 K/uL   Lymph, poc 1.8 0.6 - 3.4   POC LYMPH PERCENT 19.0 10 - 50 %L   MID (cbc) 0.6 0 - 0.9   POC MID % 6.8 0 - 12 %M   POC Granulocyte 7.0 (A) 2 - 6.9   Granulocyte percent 74.2 37 - 80 %G   RBC 5.24 4.69 - 6.13 M/uL   Hemoglobin 14.0 (A) 14.1 - 18.1 g/dL   HCT, POC 44.1 43.5 - 53.7 %   MCV 84.1 80 - 97 fL  MCH, POC 26.8 (A) 27 - 31.2 pg   MCHC 31.9 31.8 - 35.4 g/dL   RDW, POC 13.7 %   Platelet Count, POC 268 142 - 424 K/uL   MPV 8.1 0 - 99.8 fL  POCT glucose (manual entry)     Status: Abnormal   Collection Time: 03/23/15  2:04 PM  Result Value Ref Range   POC Glucose 155 (A) 70 - 99 mg/dl  POCT glycosylated hemoglobin (Hb A1C)     Status: None   Collection Time: 03/23/15  2:04 PM  Result Value Ref Range   Hemoglobin A1C 7.3   POCT UA - Microscopic Only     Status: None   Collection Time: 03/23/15  2:04 PM  Result Value Ref Range   WBC, Ur, HPF, POC 0-4    RBC, urine, microscopic 0-4    Bacteria, U Microscopic neg     Mucus, UA positive    Epithelial cells, urine per micros 0-1    Crystals, Ur, HPF, POC neg    Casts, Ur, LPF, POC neg    Yeast, UA neg   POCT urinalysis dipstick     Status: None   Collection Time: 03/23/15  2:04 PM  Result Value Ref Range   Color, UA yellow    Clarity, UA clear    Glucose, UA 100    Bilirubin, UA neg    Ketones, UA neg    Spec Grav, UA 1.020    Blood, UA small    pH, UA 5.5    Protein, UA 100    Urobilinogen, UA 0.2    Nitrite, UA neg    Leukocytes, UA Negative Negative    ASSESSMENT & PLAN  Adam Reid was seen today for hypertension and medication refill.  Diagnoses and all orders for this visit:  Type 2 diabetes mellitus with diabetic chronic kidney disease -     POCT CBC -     POCT glucose (manual entry) -     POCT glycosylated hemoglobin (Hb A1C) -     POCT UA - Microscopic Only -     POCT urinalysis dipstick -     Comprehensive metabolic panel -     PSA -     Lipid panel  Renovascular hypertension -     POCT CBC -     POCT glucose (manual entry) -     POCT glycosylated hemoglobin (Hb A1C) -     POCT UA - Microscopic Only -     POCT urinalysis dipstick -     Comprehensive metabolic panel -     PSA -     Lipid panel  Loss of weight -     POCT CBC -     POCT glucose (manual entry) -     POCT glycosylated hemoglobin (Hb A1C) -     POCT UA - Microscopic Only -     POCT urinalysis dipstick -     Comprehensive metabolic panel -     PSA -     Lipid panel -     Ambulatory referral to Gastroenterology  Elevated PSA -     PSA  Communication problem

## 2015-03-24 LAB — COMPREHENSIVE METABOLIC PANEL
ALK PHOS: 81 U/L (ref 40–115)
ALT: 13 U/L (ref 9–46)
AST: 14 U/L (ref 10–35)
Albumin: 4.9 g/dL (ref 3.6–5.1)
BILIRUBIN TOTAL: 0.6 mg/dL (ref 0.2–1.2)
BUN: 37 mg/dL — ABNORMAL HIGH (ref 7–25)
CALCIUM: 9.7 mg/dL (ref 8.6–10.3)
CO2: 28 mmol/L (ref 20–31)
Chloride: 101 mmol/L (ref 98–110)
Creat: 1.77 mg/dL — ABNORMAL HIGH (ref 0.70–1.25)
Glucose, Bld: 153 mg/dL — ABNORMAL HIGH (ref 65–99)
POTASSIUM: 4.7 mmol/L (ref 3.5–5.3)
Sodium: 138 mmol/L (ref 135–146)
TOTAL PROTEIN: 7.6 g/dL (ref 6.1–8.1)

## 2015-03-24 LAB — LIPID PANEL
Cholesterol: 165 mg/dL (ref 125–200)
HDL: 48 mg/dL (ref >=40)
LDL Cholesterol: 88 mg/dL (ref <130)
TRIGLYCERIDES: 146 mg/dL (ref <150)
Total CHOL/HDL Ratio: 3.4 Ratio (ref <=5.0)
VLDL: 29 mg/dL (ref <30)

## 2015-03-25 ENCOUNTER — Encounter: Payer: Self-pay | Admitting: Family Medicine

## 2015-03-25 LAB — PSA: PSA: 3.09 ng/mL (ref <=4.00)

## 2015-04-01 ENCOUNTER — Encounter: Payer: Self-pay | Admitting: Gastroenterology

## 2015-05-20 ENCOUNTER — Ambulatory Visit: Payer: Medicare Other | Admitting: Gastroenterology

## 2015-05-21 ENCOUNTER — Ambulatory Visit (INDEPENDENT_AMBULATORY_CARE_PROVIDER_SITE_OTHER): Payer: Medicare Other | Admitting: Gastroenterology

## 2015-05-21 ENCOUNTER — Encounter: Payer: Self-pay | Admitting: Gastroenterology

## 2015-05-21 VITALS — BP 132/86 | HR 72 | Ht 66.0 in | Wt 154.8 lb

## 2015-05-21 DIAGNOSIS — Z1211 Encounter for screening for malignant neoplasm of colon: Secondary | ICD-10-CM | POA: Diagnosis not present

## 2015-05-21 DIAGNOSIS — R634 Abnormal weight loss: Secondary | ICD-10-CM

## 2015-05-21 MED ORDER — NA SULFATE-K SULFATE-MG SULF 17.5-3.13-1.6 GM/177ML PO SOLN: 1.0000 | Freq: Once | ORAL | Status: DC

## 2015-05-21 NOTE — Progress Notes (Signed)
Adam Reid    [id number]    [birth year]  Primary Care Physician:GUEST, Veneda Melter, MD  Referring Physician: Orma Flaming, MD [address] [place], [place] [address]  Chief complaint:  Weight loss  HPI: 69 yr M with h/o type II diabetes here for new patient visit with c/o weight loss of about 10-12 lbs in past 6-7 months.  He also reported change in bowel pattern with constipation for the past 5-6 months, he is having a bowel movement every 2-3 days. No melena or bright red blood per rectum. Denies nausea or vomiting abdominal pain or diarrhea. He he is concerned about the unintentional weight loss but otherwise feels well. He has not had a screening colonoscopy.       Outpatient Encounter Prescriptions as of 05/21/2015  Medication Sig  . amLODipine (NORVASC) 5 MG tablet Take 1 tablet (5 mg total) by mouth daily.  [name] atenolol (TENORMIN) 50 MG tablet Take 1 tablet (50 mg total) by mouth daily. PATIENT NEEDS OFFICE VISIT FOR ADDITIONAL REFILLS  . atorvastatin (LIPITOR) 10 MG tablet Take 1 tablet (10 mg total) by mouth daily.  [name] glipiZIDE (GLUCOTROL) 5 MG tablet Take one po before breakfast daily for diabetes  . [DISCONTINUED] prazosin (MINIPRESS) 1 MG capsule Take 1 mg by mouth daily.   No facility-administered encounter medications on file as of 05/21/2015.    Allergies as of 05/21/2015  . (No Known Allergies)    Past Medical History  Diagnosis Date  . Thyroid disease   . Hypertension   . Diabetes mellitus without complication ([place])   . Chronic kidney disease   . Hyperlipidemia     Past Surgical History  Procedure Laterality Date  . Appendectomy      Family History  Problem Relation Age of Onset  . Hypertension Mother   . Hypertension Sister     Social History   Social History  . Marital Status: Married    Spouse Name: N/A  . Number of Children: 1  . Years of Education: N/A   Occupational History  . Not on file.   Social History Main  Topics  . Smoking status: Current Every Day Smoker -- 0.30 packs/day for 48 years    Types: Cigarettes  . Smokeless tobacco: Never Used  . Alcohol Use: No  . Drug Use: No  . Sexual Activity: Not on file   Other Topics Concern  . Not on file   Social History Narrative      Review of systems: Review of Systems  Constitutional: Negative for fever and chills.  HENT: Negative.   Eyes: Negative for blurred vision.  Respiratory: Negative for cough, shortness of breath and wheezing.   Cardiovascular: Negative for chest pain and palpitations.  Gastrointestinal: as per HPI Genitourinary: Negative for dysuria, urgency, frequency and hematuria.  Musculoskeletal: Negative for myalgias, back pain and joint pain.  Skin: Negative for itching and rash.  Neurological: Negative for dizziness, tremors, focal weakness, seizures and loss of consciousness.  Endo/Heme/Allergies: Negative for environmental allergies.  Psychiatric/Behavioral: Negative for depression, suicidal ideas and hallucinations.  All other systems reviewed and are negative.   Physical Exam: Filed Vitals:   05/21/15 1436  BP: 132/86  Pulse: 72   Gen:      No acute distress HEENT:  EOMI, sclera anicteric Neck:     No masses; no thyromegaly Lungs:    Clear to auscultation bilaterally; normal respiratory effort CV:  Regular rate and rhythm; no murmurs Abd:      + bowel sounds; soft, non-tender; no palpable masses, no distension Ext:    No edema; adequate peripheral perfusion Skin:      Warm and dry; no rash Neuro: alert and oriented x 3 Psych: normal mood and affect  Data Reviewed: FOBT negative 2015 Reviwed labs and relevant data and discussed with patient   Assessment and Plan/Recommendations:  69 year old male recently immigrated from [place] with history of type 2 diabetes here with complaints of weight loss in the past 6 months. He also reported bowel pattern change in the past 6 months. He hasn't undergone  screening colonoscopy, we will schedule him  Patient has no other GI specific symptoms. Will proceed with further management based on colonoscopy findings  Return after colonoscopy   K. Denzil Magnuson , MD [phone] Mon-Fri 8a-5p [phone] after 5p, weekends, holidays

## 2015-05-21 NOTE — Patient Instructions (Signed)

## 2015-05-22 DIAGNOSIS — Z23 Encounter for immunization: Secondary | ICD-10-CM | POA: Diagnosis not present

## 2015-06-10 DIAGNOSIS — E7800 Pure hypercholesterolemia, unspecified: Secondary | ICD-10-CM | POA: Diagnosis not present

## 2015-06-10 DIAGNOSIS — I129 Hypertensive chronic kidney disease with stage 1 through stage 4 chronic kidney disease, or unspecified chronic kidney disease: Secondary | ICD-10-CM | POA: Diagnosis not present

## 2015-06-10 DIAGNOSIS — E1129 Type 2 diabetes mellitus with other diabetic kidney complication: Secondary | ICD-10-CM | POA: Diagnosis not present

## 2015-06-14 ENCOUNTER — Other Ambulatory Visit: Payer: Self-pay | Admitting: Internal Medicine

## 2015-07-17 DIAGNOSIS — E1129 Type 2 diabetes mellitus with other diabetic kidney complication: Secondary | ICD-10-CM | POA: Diagnosis not present

## 2015-07-21 ENCOUNTER — Telehealth: Payer: Self-pay | Admitting: Gastroenterology

## 2015-07-21 NOTE — Telephone Encounter (Signed)
OK for pt to proceed with colon as scheduled. Left message to call back.

## 2015-07-21 NOTE — Telephone Encounter (Signed)
Spoke with pts wife and she is aware. 

## 2015-07-22 ENCOUNTER — Ambulatory Visit (AMBULATORY_SURGERY_CENTER): Payer: Medicare Other | Admitting: Gastroenterology

## 2015-07-22 ENCOUNTER — Encounter: Payer: Self-pay | Admitting: Gastroenterology

## 2015-07-22 VITALS — BP 144/87 | HR 78 | Temp 96.7°F | Resp 8 | Ht 66.0 in | Wt 154.0 lb

## 2015-07-22 DIAGNOSIS — D122 Benign neoplasm of ascending colon: Secondary | ICD-10-CM

## 2015-07-22 DIAGNOSIS — Z1211 Encounter for screening for malignant neoplasm of colon: Secondary | ICD-10-CM

## 2015-07-22 DIAGNOSIS — D125 Benign neoplasm of sigmoid colon: Secondary | ICD-10-CM

## 2015-07-22 DIAGNOSIS — R634 Abnormal weight loss: Secondary | ICD-10-CM | POA: Diagnosis not present

## 2015-07-22 DIAGNOSIS — E119 Type 2 diabetes mellitus without complications: Secondary | ICD-10-CM | POA: Diagnosis not present

## 2015-07-22 DIAGNOSIS — I10 Essential (primary) hypertension: Secondary | ICD-10-CM | POA: Diagnosis not present

## 2015-07-22 MED ORDER — SODIUM CHLORIDE 0.9 % IV SOLN: 500.0000 mL | INTRAVENOUS | Status: DC

## 2015-07-22 NOTE — Progress Notes (Signed)
Patient awakening,vss,report to rn 

## 2015-07-22 NOTE — Progress Notes (Signed)
Called to room to assist during endoscopic procedure.  Patient ID and intended procedure confirmed with present staff. Received instructions for my participation in the procedure from the performing physician.  

## 2015-07-22 NOTE — Patient Instructions (Signed)
YOU HAD AN ENDOSCOPIC PROCEDURE TODAY AT [facility]:   Refer to the procedure report that was given to you for any specific questions about what was found during the examination.  If the procedure report does not answer your questions, please call your gastroenterologist to clarify.  If you requested that your care partner not be given the details of your procedure findings, then the procedure report has been included in a sealed envelope for you to review at your convenience later.  YOU SHOULD EXPECT: Some feelings of bloating in the abdomen. Passage of more gas than usual.  Walking can help get rid of the air that was put into your GI tract during the procedure and reduce the bloating. If you had a lower endoscopy (such as a colonoscopy or flexible sigmoidoscopy) you may notice spotting of blood in your stool or on the toilet paper. If you underwent a bowel prep for your procedure, you may not have a normal bowel movement for a few days.  Please Note:  You might notice some irritation and congestion in your nose or some drainage.  This is from the oxygen used during your procedure.  There is no need for concern and it should clear up in a day or so.  SYMPTOMS TO REPORT IMMEDIATELY:   Following lower endoscopy (colonoscopy or flexible sigmoidoscopy):  Excessive amounts of blood in the stool  Significant tenderness or worsening of abdominal pains  Swelling of the abdomen that is new, acute  Fever of 100F or higher  F For urgent or emergent issues, a gastroenterologist can be reached at any hour by calling [phone].   DIET: Your first meal following the procedure should be a small meal and then it is ok to progress to your normal diet. Heavy or fried foods are harder to digest and may make you feel nauseous or bloated.  Likewise, meals heavy in dairy and vegetables can increase bloating.  Drink plenty of fluids but you should avoid alcoholic beverages for 24  hours.  ACTIVITY:  You should plan to take it easy for the rest of today and you should NOT DRIVE or use heavy machinery until tomorrow (because of the sedation medicines used during the test).    FOLLOW UP: Our staff will call the number listed on your records the next business day following your procedure to check on you and address any questions or concerns that you may have regarding the information given to you following your procedure. If we do not reach you, we will leave a message.  However, if you are feeling well and you are not experiencing any problems, there is no need to return our call.  We will assume that you have returned to your regular daily activities without incident.  If any biopsies were taken you will be contacted by phone or by letter within the next 1-3 weeks.  Please call us at [phone] if you have not heard about the biopsies in 3 weeks.    SIGNATURES/CONFIDENTIALITY: You and/or your care partner have signed paperwork which will be entered into your electronic medical record.  These signatures attest to the fact that that the information above on your After Visit Summary has been reviewed and is understood.  Full responsibility of the confidentiality of this discharge information lies with you and/or your care-partner.  Polyp and hemorrhoid information given.

## 2015-07-22 NOTE — Op Note (Signed)
[facility]  [address] [place], [id number]   COLONOSCOPY PROCEDURE REPORT  PATIENT: Adam, Reid  MR#: [id number] BIRTHDATE: [birth year] , 69  yrs. old GENDER: male ENDOSCOPIST: Harl Bowie, MD REFERRED [id number] Guest, M.D. PROCEDURE DATE:  07/22/2015 PROCEDURE:   Colonoscopy, screening, Colonoscopy with cold biopsy polypectomy, and Colonoscopy with snare polypectomy First Screening Colonoscopy - Avg.  risk and is 50 yrs.  old or older Yes.  Prior Negative Screening - Now for repeat screening. N/A  History of Adenoma - Now for follow-up colonoscopy & has been > or = to 3 yrs.  N/A  Polyps removed today? Yes ASA CLASS:   Class II INDICATIONS:Screening for colonic neoplasia and Colorectal Neoplasm Risk Assessment for this procedure is average risk. MEDICATIONS: Propofol 400 mg IV  DESCRIPTION OF PROCEDURE:   After the risks benefits and alternatives of the procedure were thoroughly explained, informed consent was obtained.  The digital rectal exam revealed no abnormalities of the rectum.   The [id number]  endoscope was introduced through the anus and advanced to the cecum, which was identified by both the appendix and ileocecal valve. No adverse events experienced.   The quality of the prep was good.  The instrument was then slowly withdrawn as the colon was fully examined. Estimated blood loss is zero unless otherwise noted in this procedure report.   COLON FINDINGS: A semi-pedunculated polyp ranging from 12 to 46mm in size was found in the sigmoid colon.  Polypectomies were performed using snare cautery.  The resection was complete, the polyp tissue was completely retrieved and sent to histology.   5-6 mm polyp in the ascending colon removed by cold snare, 2-3 mm polyp in the ascending colon removed by biopsy forceps.   Moderate sized internal hemorrhoids were found.  Retroflexed views revealed internal hemorrhoids. The time to cecum =  5.8 Withdrawal time = 18.3   The scope was withdrawn and the procedure completed. COMPLICATIONS: There were no immediate complications.  ENDOSCOPIC IMPRESSION: 1.   Semi-pedunculated polyp ranging from 12 to 94mm in size was found in the sigmoid colon; polypectomies were performed using snare cautery 2.   5-6 mm polyp in the ascending colon removed by cold snare, 2-3 mm polyp in the ascending colon removed by biopsy forceps 3.   Moderate sized internal hemorrhoids  RECOMMENDATIONS: 1.  If the polyp(s) removed today are proven to be adenomatous (pre-cancerous) polyps, you will need a colonoscopy in 3 years. Otherwise you should continue to follow colorectal cancer screening guidelines for "routine risk" patients with a colonoscopy in 10 years.  You will receive a letter within 1-2 weeks with the results of your biopsy as well as final recommendations.  Please call my office if you have not received a letter after 3 weeks. 2.  Await pathology results  eSigned:  Harl Bowie, MD 07/22/2015 1:50 PM      PATIENT NAME:  Adam Reid, Adam Reid MR#: [id number]

## 2015-07-23 ENCOUNTER — Telehealth: Payer: Self-pay | Admitting: *Deleted

## 2015-07-23 NOTE — Telephone Encounter (Signed)
Left message on f/u callback 

## 2015-08-04 ENCOUNTER — Encounter: Payer: Self-pay | Admitting: Gastroenterology

## 2015-09-02 ENCOUNTER — Ambulatory Visit (INDEPENDENT_AMBULATORY_CARE_PROVIDER_SITE_OTHER): Payer: Medicare Other | Admitting: Physician Assistant

## 2015-09-02 ENCOUNTER — Encounter: Payer: Self-pay | Admitting: Physician Assistant

## 2015-09-02 VITALS — BP 120/72 | HR 74 | Temp 98.4°F | Resp 16 | Ht 66.5 in | Wt 150.0 lb

## 2015-09-02 DIAGNOSIS — E119 Type 2 diabetes mellitus without complications: Secondary | ICD-10-CM | POA: Diagnosis not present

## 2015-09-02 DIAGNOSIS — E118 Type 2 diabetes mellitus with unspecified complications: Secondary | ICD-10-CM | POA: Diagnosis not present

## 2015-09-02 DIAGNOSIS — E785 Hyperlipidemia, unspecified: Secondary | ICD-10-CM | POA: Diagnosis not present

## 2015-09-02 DIAGNOSIS — I10 Essential (primary) hypertension: Secondary | ICD-10-CM

## 2015-09-02 DIAGNOSIS — Z23 Encounter for immunization: Secondary | ICD-10-CM

## 2015-09-02 DIAGNOSIS — Z1159 Encounter for screening for other viral diseases: Secondary | ICD-10-CM | POA: Diagnosis not present

## 2015-09-02 DIAGNOSIS — Z7189 Other specified counseling: Secondary | ICD-10-CM

## 2015-09-02 DIAGNOSIS — N189 Chronic kidney disease, unspecified: Secondary | ICD-10-CM

## 2015-09-02 DIAGNOSIS — N183 Chronic kidney disease, stage 3 unspecified: Secondary | ICD-10-CM

## 2015-09-02 DIAGNOSIS — Z7689 Persons encountering health services in other specified circumstances: Secondary | ICD-10-CM

## 2015-09-02 DIAGNOSIS — E1122 Type 2 diabetes mellitus with diabetic chronic kidney disease: Secondary | ICD-10-CM

## 2015-09-02 LAB — GLUCOSE, POCT (MANUAL RESULT ENTRY): POC GLUCOSE: 181 mg/dL — AB (ref 70–99)

## 2015-09-02 LAB — POCT GLYCOSYLATED HEMOGLOBIN (HGB A1C): Hemoglobin A1C: 8.1

## 2015-09-02 MED ORDER — GLUCOSE BLOOD VI STRP: ORAL_STRIP | Status: DC

## 2015-09-02 MED ORDER — GLIPIZIDE ER 10 MG PO TB24: 10.0000 mg | ORAL_TABLET | Freq: Every day | ORAL | Status: DC

## 2015-09-02 MED ORDER — ZOSTER VACCINE LIVE 19400 UNT/0.65ML ~~LOC~~ SOLR: 0.6500 mL | Freq: Once | SUBCUTANEOUS | Status: DC

## 2015-09-02 MED ORDER — GLUCOCOM LANCETS 28G MISC: Status: AC

## 2015-09-02 MED ORDER — ATORVASTATIN CALCIUM 10 MG PO TABS: 10.0000 mg | ORAL_TABLET | Freq: Every day | ORAL | Status: DC

## 2015-09-02 MED ORDER — ATENOLOL 50 MG PO TABS: 50.0000 mg | ORAL_TABLET | Freq: Every day | ORAL | Status: DC

## 2015-09-02 MED ORDER — AMLODIPINE BESYLATE 5 MG PO TABS: 5.0000 mg | ORAL_TABLET | Freq: Every day | ORAL | Status: DC

## 2015-09-02 NOTE — Progress Notes (Signed)
PCP: Kennon Portela, MD  Chief Complaint  Patient presents with  . estab care  . Diabetes    Subjective:    Patient ID: Adam Reid, male    DOB: Jul 04, 1946, 70 y.o.   MRN: [id number]  HPI Presents to establish for primary care and for re-evaluation of type 2 diabetes, HTN and renal disease. He was last seen 03/2015.  His history is somewhat difficult to collect. He does not speak [place] and his wife translates. In addition, until about 2 years ago, his health care was performed in his native [place] during visits home to care for his mother, sell their home, etc as they became citizens of the [place].  Last eye exam 2.5 years ago. Last Tetanus isn't sure-perhaps about 8 years ago when they immigrated to the [place]. He has not received the pneumococcal vaccines, nor Zostavax. Needs Hep C screening. Colonoscopy was in 07/2015 and is to be repeated in 3 years.  Has a glucometer, but it's not working well, so he stopped checking. Requests a new one. Walking for exercise when the weather is good. Usually about an hour. Tries to go every day. Has reduced portion sizes since his diagnosis of diabetes. Reduced sweets.  Not on metformin, presumably because his creatinine has been elevated. Chart reviewed. He was previously referred to endocrinology [name]) and nephrology. Did not see endocrinology because Dr. [name] office did not participate in his insurance plan. He sees [facility] [name] at [facility].  Amlodipine started 04/2014, but he recalls being on it in [place] as well. Previously on spironolactone and HCTZ, both stopped due to hypotension. Diabetes diagnosed in 06/2014. Previously hypothyroid, but has remained euthyroid off medications. Previously took prazosin for BPH, and allopurinol for gout. Lipitor started 09/2014.  Reports a lot of bowel gas and increased belching. About 3-4 weeks ago he started having redness of the tips of his toes in the evenings, associated  with the sensation that they are cold. When he touches them, they do not feel cold. His wife suggested that he restart his vitamins, and since then, the symptoms have resolved.    Patient Active Problem List   Diagnosis Date Noted  . T2DM (type 2 diabetes mellitus) ([place]) 06/28/2014  . Hyperlipidemia LDL goal <100 06/28/2014  . HTN (hypertension) 06/24/2014  . Chronic kidney disease 06/24/2014  . BPH (benign prostatic hyperplasia) 06/24/2014    Prior to Admission medications   Medication Sig Start Date End Date Taking? Authorizing Provider  amLODipine (NORVASC) 5 MG tablet Take 1 tablet (5 mg total) by mouth daily. 09/20/14  Yes Orma Flaming, MD  atenolol (TENORMIN) 50 MG tablet TAKE 1 TABLET BY MOUTH DAILY 06/17/15  Yes Orma Flaming, MD  atorvastatin (LIPITOR) 10 MG tablet Take 1 tablet (10 mg total) by mouth daily. 09/20/14  Yes Orma Flaming, MD  Cholecalciferol (VITAMIN D-3 PO) Take by mouth.   Yes Historical Provider, MD  cyanocobalamin 500 MCG tablet Take 500 mcg by mouth daily.   Yes Historical Provider, MD  FISH OIL-KRILL OIL PO Take by mouth.   Yes Historical Provider, MD  glipiZIDE (GLUCOTROL)  5 MG tablet Take one po before breakfast daily for diabetes 03/23/15  Yes Orma Flaming, MD  Multiple Vitamin (MULTIVITAMIN) tablet Take 1 tablet by mouth daily.   Yes Historical Provider, MD    Family History  Problem Relation Age of Onset  . Hypertension Mother   . Hyperlipidemia Mother   . Hypertension Sister   .  Colon cancer Neg Hx   . Kidney disease Father   . Heart disease Brother     open heart surgery    Social History   Social History  . Marital Status: Married    Spouse Name: [name]  . Number of Children: 1  . Years of Education: 12th grade   Occupational History  . Retired-accountant    Social History Main Topics  . Smoking status: Current Every Day Smoker -- 0.25 packs/day for 48 years    Types: Cigarettes  . Smokeless tobacco: Never Used  . Alcohol Use: No   . Drug Use: No  . Sexual Activity: No   Other Topics Concern  . Not on file   Social History Narrative   Originally from [place]. Came to the [place] in 2009, following their son who came here for school.   Married.   Lives with his wife.   Their adult son lives in [place], [place], where he is in optometry school.   Education: High School   Exercise: No     Review of Systems  Constitutional: Positive for activity change and unexpected weight change (weight loss). Negative for fever, chills, diaphoresis, appetite change and fatigue.  HENT: Negative.   Eyes: Negative.   Respiratory: Negative.   Cardiovascular: Negative.   Gastrointestinal: Positive for constipation. Negative for nausea, vomiting, abdominal pain, diarrhea, blood in stool, abdominal distention, anal bleeding and rectal pain.  Endocrine: Positive for polyuria. Negative for cold intolerance, heat intolerance, polydipsia and polyphagia.  Genitourinary: Positive for frequency. Negative for dysuria, urgency, hematuria, flank pain, decreased urine volume, discharge, penile swelling, scrotal swelling, enuresis, difficulty urinating, genital sores, penile pain and testicular pain.  Musculoskeletal: Negative.   Skin: Negative.   Allergic/Immunologic: Negative.   Neurological: Negative.   Hematological: Negative.   Psychiatric/Behavioral: Negative.        Objective:   Physical Exam  Constitutional: He is oriented to person, place, and time. He appears well-developed and well-nourished. He is active and cooperative. No distress.  BP 120/72 mmHg  Pulse 74  Temp(Src) 98.4 F (36.9 C) (Oral)  Resp 16  Ht 5' 6.5" (1.689 m)  Wt 150 lb (68.04 kg)  BMI 23.85 kg/m2  SpO2 98%  HENT:  Head: Normocephalic and atraumatic.  Right Ear: Hearing and external ear normal.  Left Ear: Hearing and external ear normal.  Nose: Nose normal.  Mouth/Throat: Oropharynx is clear and moist.  Eyes: Conjunctivae and EOM are normal. Pupils are equal,  round, and reactive to light. Right eye exhibits no discharge. Left eye exhibits no discharge. No scleral icterus.  Neck: Normal range of motion. Neck supple. No thyromegaly present.  Cardiovascular: Normal rate, regular rhythm and normal heart sounds.   Pulses:      Radial pulses are 2+ on the right side, and 2+ on the left side.  Pulmonary/Chest: Effort normal and breath sounds normal.  Lymphadenopathy:       Head (right side): No tonsillar, no preauricular, no posterior auricular and no occipital adenopathy present.       Head (left side): No tonsillar, no preauricular, no posterior auricular and no occipital adenopathy present.    He has no cervical adenopathy.       Right: No supraclavicular adenopathy present.       Left: No supraclavicular adenopathy present.  Neurological: He is alert and oriented to person, place, and time. No sensory deficit.  Skin: Skin is warm, dry and intact. No rash noted. No cyanosis or erythema. Nails  show no clubbing.  Psychiatric: He has a normal mood and affect. His speech is normal and behavior is normal.      Results for orders placed or performed in visit on 09/02/15  POCT glucose (manual entry)  Result Value Ref Range   POC Glucose 181 (A) 70 - 99 mg/dl  POCT glycosylated hemoglobin (Hb A1C)  Result Value Ref Range   Hemoglobin A1C 8.1        Assessment & Plan:  1. Encounter to establish care I will assume the role of his PCP.  2. Type 2 diabetes mellitus without complication, without long-term current use of insulin (HCC) Not at goal. Increase glipizide and return to the XR formulation. Advised eye exam. Continue with healthy lifestyle modification. Add ASA 81 mg daily. - POCT glucose (manual entry) - POCT glycosylated hemoglobin (Hb A1C) - Comprehensive metabolic panel - Microalbumin, urine - HM Diabetes Foot Exam - glipiZIDE (GLUCOTROL XL) 10 MG 24 hr tablet; Take 1 tablet (10 mg total) by mouth daily with breakfast.  Dispense: 90  tablet; Refill: 3 - For home use only DME Glucometer - glucose blood test strip; Use as instructed  Dispense: 100 each; Refill: 3 - GlucoCom Lancets MISC; Use for home glucose monitoring  Dispense: 100 each; Refill: 3   3. Essential hypertension Controlled. Continue current treatment. I note that he is not on an ACEI, but as nephrology has not recommended a change, continue without for now. - Comprehensive metabolic panel - CBC with Differential/Platelet - TSH - amLODipine (NORVASC) 5 MG tablet; Take 1 tablet (5 mg total) by mouth daily.  Dispense: 90 tablet; Refill: 3 - atenolol (TENORMIN) 50 MG tablet; Take 1 tablet (50 mg total) by mouth daily.  Dispense: 90 tablet; Refill: 3  4. Chronic kidney disease, unspecified stage Continue to control HTN and improve control of DM.  5. Hyperlipidemia LDL goal <100 Await lab results. Increase lipitor dose if indicated. - Lipid panel - atorvastatin (LIPITOR) 10 MG tablet; Take 1 tablet (10 mg total) by mouth daily.  Dispense: 90 tablet; Refill: 3  6. Need for hepatitis C screening test - Hepatitis C antibody  7. Need for shingles vaccine - zoster vaccine live, PF, (ZOSTAVAX) 63875 UNT/0.65ML injection; Inject 19,400 Units into the skin once.  Dispense: 0.65 mL; Refill: 0  8. Need for pneumococcal vaccination - Pneumococcal conjugate vaccine 13-valent IM   Return in about 3 months (around 11/30/2015).   Fara Chute, PA-C Physician Assistant-Certified [facility]

## 2015-09-02 NOTE — Patient Instructions (Addendum)
I will contact you with your lab results as soon as they are available.   If you have not heard from me in 2 weeks, please contact me.  The fastest way to get your results is to register for My Chart (see the instructions on the last page of this printout).  Please try OTC Simethicone (the active ingredient in Mylanta Gas) with meals to see if that helps with the increased belching and gas.  Please continue the blood pressure medications that you are on.  Continue working on healthy eating choices and getting regular exercise.  I have INCREASED the glipizide dose to get better control of the diabetes.  Please schedule an eye appointment.  Please take a baby aspirin each day (81 mg).  Please take the prescription to the pharmacy to obtain the vaccine to prevent shingles (varicella zoster).

## 2015-09-03 LAB — LIPID PANEL
CHOL/HDL RATIO: 2.6 ratio (ref <=5.0)
Cholesterol: 145 mg/dL (ref 125–200)
HDL: 55 mg/dL (ref >=40)
LDL Cholesterol: 61 mg/dL (ref <130)
TRIGLYCERIDES: 146 mg/dL (ref <150)
VLDL: 29 mg/dL (ref <30)

## 2015-09-03 LAB — CBC WITH DIFFERENTIAL/PLATELET
BASOS ABS: 0.1 10*3/uL (ref 0.0–0.1)
Basophils Relative: 1 % (ref 0–1)
EOS ABS: 0.3 10*3/uL (ref 0.0–0.7)
EOS PCT: 3 % (ref 0–5)
HCT: 34.9 % — ABNORMAL LOW (ref 39.0–52.0)
Hemoglobin: 11.5 g/dL — ABNORMAL LOW (ref 13.0–17.0)
LYMPHS PCT: 22 % (ref 12–46)
Lymphs Abs: 2.1 10*3/uL (ref 0.7–4.0)
MCH: 27.4 pg (ref 26.0–34.0)
MCHC: 33 g/dL (ref 30.0–36.0)
MCV: 83.1 fL (ref 78.0–100.0)
MPV: 10.5 fL (ref 8.6–12.4)
Monocytes Absolute: 0.6 10*3/uL (ref 0.1–1.0)
Monocytes Relative: 6 % (ref 3–12)
NEUTROS PCT: 68 % (ref 43–77)
Neutro Abs: 6.5 10*3/uL (ref 1.7–7.7)
PLATELETS: 387 10*3/uL (ref 150–400)
RBC: 4.2 MIL/uL — AB (ref 4.22–5.81)
RDW: 13.9 % (ref 11.5–15.5)
WBC: 9.6 10*3/uL (ref 4.0–10.5)

## 2015-09-03 LAB — COMPREHENSIVE METABOLIC PANEL
ALBUMIN: 4.3 g/dL (ref 3.6–5.1)
ALT: 12 U/L (ref 9–46)
AST: 14 U/L (ref 10–35)
Alkaline Phosphatase: 96 U/L (ref 40–115)
BUN: 33 mg/dL — ABNORMAL HIGH (ref 7–25)
CALCIUM: 9.5 mg/dL (ref 8.6–10.3)
CHLORIDE: 99 mmol/L (ref 98–110)
CO2: 28 mmol/L (ref 20–31)
CREATININE: 2.23 mg/dL — AB (ref 0.70–1.25)
Glucose, Bld: 190 mg/dL — ABNORMAL HIGH (ref 65–99)
Potassium: 4 mmol/L (ref 3.5–5.3)
SODIUM: 139 mmol/L (ref 135–146)
TOTAL PROTEIN: 7.4 g/dL (ref 6.1–8.1)
Total Bilirubin: 0.5 mg/dL (ref 0.2–1.2)

## 2015-09-03 LAB — HEPATITIS C ANTIBODY: HCV Ab: NEGATIVE

## 2015-09-03 LAB — MICROALBUMIN, URINE: Microalb, Ur: 60.3 mg/dL

## 2015-09-03 LAB — TSH: TSH: 5.19 mIU/L — ABNORMAL HIGH (ref 0.40–4.50)

## 2015-09-09 ENCOUNTER — Telehealth: Payer: Self-pay

## 2015-09-09 NOTE — Telephone Encounter (Signed)
[facility] faxed form for DM supplies needed for Medicare. Completed and will put in Chelle's box for signature.

## 2015-09-11 DIAGNOSIS — H2513 Age-related nuclear cataract, bilateral: Secondary | ICD-10-CM | POA: Diagnosis not present

## 2015-09-16 NOTE — Telephone Encounter (Signed)
Form signed and returned to the nurse's box 09/15/2015, 9 pm

## 2015-10-09 ENCOUNTER — Other Ambulatory Visit: Payer: Self-pay | Admitting: Internal Medicine

## 2015-11-05 ENCOUNTER — Telehealth: Payer: Self-pay

## 2015-11-05 DIAGNOSIS — E118 Type 2 diabetes mellitus with unspecified complications: Secondary | ICD-10-CM

## 2015-11-05 NOTE — Telephone Encounter (Signed)
Pts needs a refill on test strips  Please advise    [phone]

## 2015-11-06 MED ORDER — GLUCOSE BLOOD VI STRP: ORAL_STRIP | Status: DC

## 2015-11-06 NOTE — Telephone Encounter (Signed)
Sent in RFs and notified wife.

## 2015-11-07 ENCOUNTER — Telehealth: Payer: Self-pay

## 2015-11-07 NOTE — Telephone Encounter (Signed)
[facility] on [address] called regarding test strips rx. Pt says they test twice a day. Rx originally written to test once a day. Approval from Bennett Scrape, PA-C to change to twice a day.

## 2015-11-07 NOTE — Telephone Encounter (Signed)
[facility] called and asked for Adam Reid to inform her that a medicare form has been faxed for [place] to fill out.

## 2015-11-11 NOTE — Telephone Encounter (Signed)
Received and filled out another copy of this form, and put in Chelle's box. I tried to call [facility] Medicare dept to see why they need another one but could not get through. Will re-send in case they didn't get the prev one.

## 2015-11-12 NOTE — Telephone Encounter (Signed)
Faxed completed form to [facility].

## 2015-12-02 ENCOUNTER — Ambulatory Visit (INDEPENDENT_AMBULATORY_CARE_PROVIDER_SITE_OTHER): Payer: Medicare Other | Admitting: Family Medicine

## 2015-12-02 ENCOUNTER — Encounter (HOSPITAL_COMMUNITY): Payer: Self-pay

## 2015-12-02 ENCOUNTER — Observation Stay (HOSPITAL_COMMUNITY)
Admission: EM | Admit: 2015-12-02 | Discharge: 2015-12-03 | Disposition: A | Discharge status: Home / Self Care | Payer: Medicare Other | Attending: Family Medicine | Admitting: Family Medicine

## 2015-12-02 ENCOUNTER — Emergency Department (HOSPITAL_COMMUNITY): Payer: Medicare Other

## 2015-12-02 VITALS — BP 111/68 | HR 80 | Temp 98.7°F | Resp 18 | Ht 67.0 in | Wt 145.0 lb

## 2015-12-02 DIAGNOSIS — E1129 Type 2 diabetes mellitus with other diabetic kidney complication: Secondary | ICD-10-CM

## 2015-12-02 DIAGNOSIS — Z720 Tobacco use: Secondary | ICD-10-CM

## 2015-12-02 DIAGNOSIS — R079 Chest pain, unspecified: Secondary | ICD-10-CM | POA: Diagnosis not present

## 2015-12-02 DIAGNOSIS — F17210 Nicotine dependence, cigarettes, uncomplicated: Secondary | ICD-10-CM | POA: Insufficient documentation

## 2015-12-02 DIAGNOSIS — E785 Hyperlipidemia, unspecified: Secondary | ICD-10-CM | POA: Diagnosis not present

## 2015-12-02 DIAGNOSIS — IMO0001 Reserved for inherently not codable concepts without codable children: Secondary | ICD-10-CM

## 2015-12-02 DIAGNOSIS — E1169 Type 2 diabetes mellitus with other specified complication: Secondary | ICD-10-CM

## 2015-12-02 DIAGNOSIS — N400 Enlarged prostate without lower urinary tract symptoms: Secondary | ICD-10-CM | POA: Diagnosis not present

## 2015-12-02 DIAGNOSIS — I129 Hypertensive chronic kidney disease with stage 1 through stage 4 chronic kidney disease, or unspecified chronic kidney disease: Secondary | ICD-10-CM | POA: Diagnosis not present

## 2015-12-02 DIAGNOSIS — N189 Chronic kidney disease, unspecified: Secondary | ICD-10-CM | POA: Insufficient documentation

## 2015-12-02 DIAGNOSIS — N184 Chronic kidney disease, stage 4 (severe): Secondary | ICD-10-CM

## 2015-12-02 DIAGNOSIS — Z23 Encounter for immunization: Secondary | ICD-10-CM | POA: Insufficient documentation

## 2015-12-02 DIAGNOSIS — E1165 Type 2 diabetes mellitus with hyperglycemia: Secondary | ICD-10-CM | POA: Diagnosis not present

## 2015-12-02 DIAGNOSIS — R0789 Other chest pain: Secondary | ICD-10-CM | POA: Diagnosis not present

## 2015-12-02 DIAGNOSIS — N289 Disorder of kidney and ureter, unspecified: Secondary | ICD-10-CM

## 2015-12-02 DIAGNOSIS — I10 Essential (primary) hypertension: Secondary | ICD-10-CM

## 2015-12-02 DIAGNOSIS — E1122 Type 2 diabetes mellitus with diabetic chronic kidney disease: Secondary | ICD-10-CM | POA: Insufficient documentation

## 2015-12-02 DIAGNOSIS — I2089 Other forms of angina pectoris: Secondary | ICD-10-CM | POA: Diagnosis present

## 2015-12-02 DIAGNOSIS — R739 Hyperglycemia, unspecified: Secondary | ICD-10-CM

## 2015-12-02 DIAGNOSIS — N183 Chronic kidney disease, stage 3 (moderate): Secondary | ICD-10-CM

## 2015-12-02 DIAGNOSIS — I1310 Hypertensive heart and chronic kidney disease without heart failure, with stage 1 through stage 4 chronic kidney disease, or unspecified chronic kidney disease: Secondary | ICD-10-CM | POA: Diagnosis present

## 2015-12-02 DIAGNOSIS — R072 Precordial pain: Secondary | ICD-10-CM

## 2015-12-02 DIAGNOSIS — I208 Other forms of angina pectoris: Secondary | ICD-10-CM | POA: Diagnosis present

## 2015-12-02 LAB — GLUCOSE, CAPILLARY: GLUCOSE-CAPILLARY: 179 mg/dL — AB (ref 65–99)

## 2015-12-02 LAB — CBC
HEMATOCRIT: 35.6 % — AB (ref 39.0–52.0)
HEMOGLOBIN: 11.8 g/dL — AB (ref 13.0–17.0)
MCH: 27.4 pg (ref 26.0–34.0)
MCHC: 33.1 g/dL (ref 30.0–36.0)
MCV: 82.6 fL (ref 78.0–100.0)
Platelets: 311 10*3/uL (ref 150–400)
RBC: 4.31 MIL/uL (ref 4.22–5.81)
RDW: 13.8 % (ref 11.5–15.5)
WBC: 13.9 10*3/uL — ABNORMAL HIGH (ref 4.0–10.5)

## 2015-12-02 LAB — TROPONIN I
Troponin I: 0.03 ng/mL (ref <0.031)
Troponin I: 0.03 ng/mL (ref <0.031)

## 2015-12-02 LAB — BASIC METABOLIC PANEL
ANION GAP: 9 (ref 5–15)
BUN: 39 mg/dL — ABNORMAL HIGH (ref 6–20)
CALCIUM: 9.7 mg/dL (ref 8.9–10.3)
CO2: 27 mmol/L (ref 22–32)
Chloride: 100 mmol/L — ABNORMAL LOW (ref 101–111)
Creatinine, Ser: 2.14 mg/dL — ABNORMAL HIGH (ref 0.61–1.24)
GFR calc Af Amer: 35 mL/min — ABNORMAL LOW (ref >60)
GFR calc non Af Amer: 30 mL/min — ABNORMAL LOW (ref >60)
GLUCOSE: 155 mg/dL — AB (ref 65–99)
POTASSIUM: 4 mmol/L (ref 3.5–5.1)
Sodium: 136 mmol/L (ref 135–145)

## 2015-12-02 LAB — TSH: TSH: 1.545 u[IU]/mL (ref 0.350–4.500)

## 2015-12-02 LAB — I-STAT TROPONIN, ED: Troponin i, poc: 0.01 ng/mL (ref 0.00–0.08)

## 2015-12-02 MED ORDER — NITROGLYCERIN 0.4 MG SL SUBL: 0.4000 mg | SUBLINGUAL_TABLET | SUBLINGUAL | Status: DC | PRN

## 2015-12-02 MED ORDER — ATORVASTATIN CALCIUM 10 MG PO TABS: 10.0000 mg | ORAL_TABLET | Freq: Every day | ORAL | Status: DC

## 2015-12-02 MED ORDER — HEPARIN SODIUM (PORCINE) 5000 UNIT/ML IJ SOLN
5000.0000 [IU] | Freq: Three times a day (TID) | INTRAMUSCULAR | Status: DC
  Administered 2015-12-02 – 2015-12-03 (×2): 5000 [IU] via SUBCUTANEOUS
  Filled 2015-12-02 (×2): qty 1

## 2015-12-02 MED ORDER — ASPIRIN EC 81 MG PO TBEC
81.0000 mg | DELAYED_RELEASE_TABLET | Freq: Every day | ORAL | Status: DC
  Administered 2015-12-03: 81 mg via ORAL
  Filled 2015-12-02: qty 1

## 2015-12-02 MED ORDER — ATORVASTATIN CALCIUM 80 MG PO TABS
80.0000 mg | ORAL_TABLET | Freq: Every day | ORAL | Status: DC
  Administered 2015-12-02 – 2015-12-03 (×2): 80 mg via ORAL
  Filled 2015-12-02 (×2): qty 1

## 2015-12-02 MED ORDER — AMLODIPINE BESYLATE 5 MG PO TABS
5.0000 mg | ORAL_TABLET | Freq: Every day | ORAL | Status: DC
  Administered 2015-12-02 – 2015-12-03 (×2): 5 mg via ORAL
  Filled 2015-12-02 (×2): qty 1

## 2015-12-02 MED ORDER — ACETAMINOPHEN 325 MG PO TABS: 650.0000 mg | ORAL_TABLET | ORAL | Status: DC | PRN

## 2015-12-02 MED ORDER — ASPIRIN 81 MG PO CHEW
243.0000 mg | CHEWABLE_TABLET | Freq: Once | ORAL | Status: AC
Start: 2015-12-02 — End: 2015-12-02
  Administered 2015-12-02: 243 mg via ORAL
  Filled 2015-12-02: qty 3

## 2015-12-02 MED ORDER — ATENOLOL 25 MG PO TABS
50.0000 mg | ORAL_TABLET | Freq: Every day | ORAL | Status: DC
  Administered 2015-12-02 – 2015-12-03 (×2): 50 mg via ORAL
  Filled 2015-12-02 (×2): qty 2

## 2015-12-02 MED ORDER — GI COCKTAIL ~~LOC~~
30.0000 mL | Freq: Once | ORAL | Status: AC
  Administered 2015-12-02: 30 mL via ORAL
  Filled 2015-12-02: qty 30

## 2015-12-02 MED ORDER — INSULIN ASPART 100 UNIT/ML ~~LOC~~ SOLN
0.0000 [IU] | Freq: Three times a day (TID) | SUBCUTANEOUS | Status: DC
  Administered 2015-12-03: 1 [IU] via SUBCUTANEOUS

## 2015-12-02 MED ORDER — ONDANSETRON HCL 4 MG/2ML IJ SOLN: 4.0000 mg | Freq: Four times a day (QID) | INTRAMUSCULAR | Status: DC | PRN

## 2015-12-02 NOTE — ED Notes (Signed)
PA Harris at bedside

## 2015-12-02 NOTE — ED Provider Notes (Signed)
CSN: [id number]     Arrival date & time 12/02/15  1109 History   First MD Initiated Contact with Patient 12/02/15 1254     Chief Complaint  Patient presents with  . Chest Pain     (Consider location/radiation/quality/duration/timing/severity/associated sxs/prior Treatment) HPI This is a 70 year old male who presents emergency Department with chief complaint of chest pain. The patient speaks [place]. His son who is in medical school provides translation.  The patient awoke this morning at 3 AM with left-sided and retrosternal chest pain radiating into his throat. He describes the pain as aching. He denies any nausea, diaphoresis, vomiting or history of heart problems. The patient is diabetic with poorly controlled hypertension, hyperlipidemia, and is a daily smoker. He states he smokes about 5 cigarettes a day for the past 50 years. During his visit, he began having left sided shoulder pain. He rates his current pain at 3 out of 10. He denies any current nausea or vomiting. He took 1 baby aspirin this morning. In by his PCP who [facility]  Past Medical History  Diagnosis Date  . Thyroid disease   . Hypertension   . Diabetes mellitus without complication ([place])   . Chronic kidney disease   . Hyperlipidemia    Past Surgical History  Procedure Laterality Date  . Appendectomy     Family History  Problem Relation Age of Onset  . Hypertension Mother   . Hyperlipidemia Mother   . Hypertension Sister   . Colon cancer Neg Hx   . Kidney disease Father   . Heart disease Brother     open heart surgery   Social History  Substance Use Topics  . Smoking status: Current Every Day Smoker -- 0.25 packs/day for 48 years    Types: Cigarettes  . Smokeless tobacco: Never Used  . Alcohol Use: No    Review of Systems Ten systems reviewed and are negative for acute change, except as noted in the HPI.    Allergies  Review of patient's allergies  indicates no known allergies.  Home Medications   Prior to Admission medications   Medication Sig Start Date End Date Taking? Authorizing Provider  amLODipine (NORVASC) 5 MG tablet Take 1 tablet (5 mg total) by mouth daily. 09/02/15   Chelle Jeffery, PA-C  atenolol (TENORMIN) 50 MG tablet Take 1 tablet (50 mg total) by mouth daily. 09/02/15   Chelle Jeffery, PA-C  atorvastatin (LIPITOR) 10 MG tablet Take 1 tablet (10 mg total) by mouth daily. 09/02/15   Chelle Jeffery, PA-C  Cholecalciferol (VITAMIN D-3 PO) Take by mouth.    Historical Provider, MD  cyanocobalamin 500 MCG tablet Take 500 mcg by mouth daily. Reported on 12/02/2015    Historical Provider, MD  FISH OIL-KRILL OIL PO Take by mouth.    Historical Provider, MD  glipiZIDE (GLUCOTROL XL) 10 MG 24 hr tablet Take 1 tablet (10 mg total) by mouth daily with breakfast. 09/02/15   Harrison Mons, PA-C  GlucoCom Lancets MISC Use for home glucose monitoring 09/02/15   Chelle Jeffery, PA-C  glucose blood test strip Use to test blood sugar once daily. Dx: E11.9 11/06/15   Harrison Mons, PA-C  Multiple Vitamin (MULTIVITAMIN) tablet Take 1 tablet by mouth daily. Reported on 12/02/2015    Historical Provider, MD  zoster vaccine live, PF, (ZOSTAVAX) 91478 UNT/0.65ML injection Inject 19,400 Units into the skin once. 09/02/15   Chelle Jeffery, PA-C   BP 156/91 mmHg  Pulse  75  Temp(Src) 99.1 F (37.3 C) (Oral)  Resp 18  Ht 5\' 7"  (1.702 m)  Wt 65.772 kg  BMI 22.71 kg/m2  SpO2 100% Physical Exam  Constitutional: He is oriented to person, place, and time. He appears well-developed and well-nourished. No distress.  HENT:  Head: Normocephalic and atraumatic.  Eyes: Conjunctivae are normal. No scleral icterus.  Neck: Normal range of motion. Neck supple. No JVD present.  Cardiovascular: Normal rate, regular rhythm, normal heart sounds and intact distal pulses.   No peripheral edema  Pulmonary/Chest: Effort normal and breath sounds normal. No respiratory  distress. He has no wheezes. He has no rales. He exhibits no tenderness.  Abdominal: Soft. There is no tenderness.  Musculoskeletal: He exhibits no edema.  Neurological: He is alert and oriented to person, place, and time.  Skin: Skin is warm and dry. He is not diaphoretic.  Psychiatric: His behavior is normal.  Nursing note and vitals reviewed.   ED Course  Procedures (including critical care time) Labs Review Labs Reviewed  BASIC METABOLIC PANEL - Abnormal; Notable for the following:    Chloride 100 (*)    Glucose, Bld 155 (*)    BUN 39 (*)    Creatinine, Ser 2.14 (*)    GFR calc non Af Amer 30 (*)    GFR calc Af Amer 35 (*)    All other components within normal limits  CBC - Abnormal; Notable for the following:    WBC 13.9 (*)    Hemoglobin 11.8 (*)    HCT 35.6 (*)    All other components within normal limits  Randolm Idol, ED    Imaging Review Dg Chest 2 View  12/02/2015  CLINICAL DATA:  Chest pain.  Hypertensive yesterday. EXAM: CHEST  2 VIEW COMPARISON:  04/30/2014 FINDINGS: The cardiac silhouette is within normal limits for size. Thoracic aorta is mildly tortuous. The patient has taken a shallower inspiration than on the prior study. No confluent airspace opacity, edema, pleural effusion, or pneumothorax is identified. No acute osseous abnormality is seen. IMPRESSION: No active cardiopulmonary disease. Electronically Signed   By: Logan Bores M.D.   On: 12/02/2015 12:48   I have personally reviewed and evaluated these images and lab results as part of my medical decision-making.   EKG Interpretation   Date/Time:  Tuesday Dec 02 2015 11:25:24 EDT Ventricular Rate:  77 PR Interval:  134 QRS Duration: 92 QT Interval:  420 QTC Calculation: 475 R Axis:   28 Text Interpretation:  Normal sinus rhythm Nonspecific ST and T wave  abnormality Prolonged QT Abnormal ECG agree. no STEMI Confirmed by  Johnney Killian, MD, Jeannie Done [phone]) on 12/02/2015 1:34:19 PM      MDM    Final diagnoses:  None   Patient with multiple risk factors for acute coronary syndrome HEART Score 6 I discussed the case with Dr. Johnney Killian and we agree that he likely needs an observation admission for chest pain rule out.   Patient will be admitted by the family medicine service  Margarita Mail, PA-C 12/02/15 [place], MD 12/04/15 [phone]

## 2015-12-02 NOTE — Patient Instructions (Addendum)
IF you received an x-ray today, you will receive an invoice from [facility]. Please contact [facility] at [phone] with questions or concerns regarding your invoice.   IF you received labwork today, you will receive an invoice from [employer]. Please contact [facility] at [phone] with questions or concerns regarding your invoice.   Our billing staff will not be able to assist you with questions regarding bills from these companies.  You will be contacted with the lab results as soon as they are available. The fastest way to get your results is to activate your My Chart account. Instructions are located on the last page of this paperwork. If you have not heard from [place] regarding the results in 2 weeks, please contact this office.     Based on your heart disease risk factors, I'm concerned about chest pain being heart related.  You do need to be seen in the [facility] emergency room as soon as you leave here.  If there are any change in your symptoms, or worsening on the way the emergency room, call [phone]. Follow-up with Chelle after the emergency room visit to discuss diabetes and weight loss further.  Nonspecific Chest Pain  Chest pain can be caused by many different conditions. There is always a chance that your pain could be related to something serious, such as a heart attack or a blood clot in your lungs. Chest pain can also be caused by conditions that are not life-threatening. If you have chest pain, it is very important to follow up with your health care provider. CAUSES  Chest pain can be caused by:  Heartburn.  Pneumonia or bronchitis.  Anxiety or stress.  Inflammation around your heart (pericarditis) or lung (pleuritis or pleurisy).  A blood clot in your lung.  A collapsed lung (pneumothorax). It can develop suddenly on its own (spontaneous pneumothorax) or from trauma to the chest.  Shingles infection (varicella-zoster  virus).  Heart attack.  Damage to the bones, muscles, and cartilage that make up your chest wall. This can include:  Bruised bones due to injury.  Strained muscles or cartilage due to frequent or repeated coughing or overwork.  Fracture to one or more ribs.  Sore cartilage due to inflammation (costochondritis). RISK FACTORS  Risk factors for chest pain may include:  Activities that increase your risk for trauma or injury to your chest.  Respiratory infections or conditions that cause frequent coughing.  Medical conditions or overeating that can cause heartburn.  Heart disease or family history of heart disease.  Conditions or health behaviors that increase your risk of developing a blood clot.  Having had chicken pox (varicella zoster). SIGNS AND SYMPTOMS Chest pain can feel like:  Burning or tingling on the surface of your chest or deep in your chest.  Crushing, pressure, aching, or squeezing pain.  Dull or sharp pain that is worse when you move, cough, or take a deep breath.  Pain that is also felt in your back, neck, shoulder, or arm, or pain that spreads to any of these areas. Your chest pain may come and go, or it may stay constant. DIAGNOSIS Lab tests or other studies may be needed to find the cause of your pain. Your health care provider may have you take a test called an ambulatory ECG (electrocardiogram). An ECG records your heartbeat patterns at the time the test is performed. You may also have other tests, such as:  Transthoracic echocardiogram (TTE). During echocardiography,  sound waves are used to create a picture of all of the heart structures and to look at how blood flows through your heart.  Transesophageal echocardiogram (TEE).This is a more advanced imaging test that obtains images from inside your body. It allows your health care provider to see your heart in finer detail.  Cardiac monitoring. This allows your health care provider to monitor your  heart rate and rhythm in real time.  Holter monitor. This is a portable device that records your heartbeat and can help to diagnose abnormal heartbeats. It allows your health care provider to track your heart activity for several days, if needed.  Stress tests. These can be done through exercise or by taking medicine that makes your heart beat more quickly.  Blood tests.  Imaging tests. TREATMENT  Your treatment depends on what is causing your chest pain. Treatment may include:  Medicines. These may include:  Acid blockers for heartburn.  Anti-inflammatory medicine.  Pain medicine for inflammatory conditions.  Antibiotic medicine, if an infection is present.  Medicines to dissolve blood clots.  Medicines to treat coronary artery disease.  Supportive care for conditions that do not require medicines. This may include:  Resting.  Applying heat or cold packs to injured areas.  Limiting activities until pain decreases. HOME CARE INSTRUCTIONS  If you were prescribed an antibiotic medicine, finish it all even if you start to feel better.  Avoid any activities that bring on chest pain.  Do not use any tobacco products, including cigarettes, chewing tobacco, or electronic cigarettes. If you need help quitting, ask your health care provider.  Do not drink alcohol.  Take medicines only as directed by your health care provider.  Keep all follow-up visits as directed by your health care provider. This is important. This includes any further testing if your chest pain does not go away.  If heartburn is the cause for your chest pain, you may be told to keep your head raised (elevated) while sleeping. This reduces the chance that acid will go from your stomach into your esophagus.  Make lifestyle changes as directed by your health care provider. These may include:  Getting regular exercise. Ask your health care provider to suggest some activities that are safe for you.  Eating a  heart-healthy diet. A registered dietitian can help you to learn healthy eating options.  Maintaining a healthy weight.  Managing diabetes, if necessary.  Reducing stress. SEEK MEDICAL CARE IF:  Your chest pain does not go away after treatment.  You have a rash with blisters on your chest.  You have a fever. SEEK IMMEDIATE MEDICAL CARE IF:   Your chest pain is worse.  You have an increasing cough, or you cough up blood.  You have severe abdominal pain.  You have severe weakness.  You faint.  You have chills.  You have sudden, unexplained chest discomfort.  You have sudden, unexplained discomfort in your arms, back, neck, or jaw.  You have shortness of breath at any time.  You suddenly start to sweat, or your skin gets clammy.  You feel nauseous or you vomit.  You suddenly feel light-headed or dizzy.  Your heart begins to beat quickly, or it feels like it is skipping beats. These symptoms may represent a serious problem that is an emergency. Do not wait to see if the symptoms will go away. Get medical help right away. Call your local emergency services (911 in the [place] Do not drive yourself to the hospital.  This information is not intended to replace advice given to you by your health care provider. Make sure you discuss any questions you have with your health care provider.   Document Released: 04/07/2005 Document Revised: 07/19/2014 Document Reviewed: 02/01/2014 Elsevier Interactive Patient Education [employer].

## 2015-12-02 NOTE — H&P (Signed)
[facility] Admission History and Physical Service Pager: [phone]  Patient name: Adam Reid Medical record number: [id number] Date of birth: [birth year] Age: 70 y.o. Gender: male  Primary Care Provider: JEFFERY,CHELLE, PA-C Consultants: cards in am Code Status: FULL (discussed on admission)  Chief Complaint: chest pain  Assessment and Plan: Adam Reid is a 70 y.o. male presenting with chest pain . PMH is significant for T2DM, chronic kidney disease, HTN, tobacco abuse and hyperlipidemia   # Atypical angina: Heart score of 6.  ASCVD risk 40.9%.  EKG with nonspecific ST and T wave changes.  iTrop negative.  BP elevated 185/106>149/96 in ED.  T 99.77F.  VS otherwise stable.  WBC 13.9. Cr 2.14. TSH in 08/2015 elevated 5.19. CXR with no acute processes.  DDx: MI (presentation somewhat atypical, also negative troponin and EKG with nonspecific ST changes but does have significant risk factors so amongst the leading diagnoses at this time) vs costocondral/ MSK (this is a possibility due to reproducible chest pain upon palpation to chest, but no inciting activities)  vs pericarditis (could consider in the setting of chest pain w/ leukocytosis and low grade fever, although no diffuse ST changes on EKG and no change in chest pain with forward flexion, nor does he have pericardial friction rub on exam) vs PNA (unlikely as CXR is WNL but pt does have leukocytosis and what sounds like pleuritic chest pain) vs PE (unlikely as patient is not tachycardic, short of breath, or hypoxic. Wells score for PE and DVT of 0) vs reflux (patient notes that his "throat bone" is uncomfortable) - Place in observation under Dr Nori Riis - Telemetry - VS per floor protocol - Trend troponin - TSH, A1c.  Lipids performed 08/2015. - BMP, CBC in am - Repeat EKG now (poor quality of previous) - EKG in am - Cards consult in am, possible myoview? - ASA, Nitro - Increase Lipitor to 80 mg. If cannot  tolerate, would at least try to keep on 40 mg. - GI cocktail now and prn - Continue Atenolol - NPO after MDN in case cards want to perform additional studies  # HTN: BP elevated in ED.  Has not taken today's meds.  On Norvasc, Atenolol outpatient - Continue home meds tonight  # DM2: On Glucotrol outpatient.  Last A1c (08/2015) 8.1 - sSSI while inpt - CBG monitoring qAC and HS - Repeat A1c as above  # HLD: Lipid panel performed outpatient 08/2015. Total 145, HDL 55, LDL 61.  On lipitor 10 outpatient.  ASCVD risk 40.9%. 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  Sees Dr [name] [facility] at [facility]. - Avoid nephrotoxic agents - BMP in am  FEN/GI: HH/Carb mod diet Prophylaxis: Heparin sub-q  Disposition: Place in obs telemetry for ACS evaluation  History of Present Illness:  Adam Reid is a 70 y.o. male presenting with chest pain.   Patient states that he woke up around 0300 to use the restroom and noticed that he had pain in the middle of his chest. He describes the pain as sharp and nonradiating. He reports that he went back to sleep and thought the pain would go away.  He woke up with the pain still present. He was scheduled to see his PCP today.  Before his appointment, his wife had him take an Aspirin.  Per patient, this improved his chest pain.  At his  PCP's office, they advised him to go to the hospital. He reports that his chest pain decreased from a 7/10 to a 1.5/10 after taking ASA.  Denies any recent travel or immobility. Does endorse some centralized chest pain worse when taking a deep breath. Denies any recent illnesses or sick contacts.  Denies fevers, chills, hemoptysis, cough, congestion, LE swelling or pain.  No orthopnea Wife reports that he has a history of acid reflux.  He is not on any reflux medication.  Has not been doing any heavy lifting or physical activity outside of normal.   Reports compliance with medications, though notes he has not taken any today besides aspirin.  No family history of cardiovascular disease.  Additionally, he resides with his wife.  He is [place] speaking.  He performs all ADLs independently.  He wishes to be full code.  Review Of Systems: Per HPI with the following additions: see HPI Otherwise the remainder of the systems were negative.  Patient Active Problem List   Diagnosis Date Noted  . T2DM (type 2 diabetes mellitus) ([place]) 06/28/2014  . Hyperlipidemia LDL goal <100 06/28/2014  . HTN (hypertension) 06/24/2014  . Chronic kidney disease 06/24/2014  . BPH (benign prostatic hyperplasia) 06/24/2014    Past Medical History: Past Medical History  Diagnosis Date  . Thyroid disease   . Hypertension   . Diabetes mellitus without complication ([place])   . Chronic kidney disease   . Hyperlipidemia     Past Surgical History: Past Surgical History  Procedure Laterality Date  . Appendectomy      Social History: Social History  Substance Use Topics  . Smoking status: Current Every Day Smoker -- 0.25 packs/day for 48 years    Types: Cigarettes  . Smokeless tobacco: Never Used  . Alcohol Use: No   Additional social history: no ETOH or drug use.  Lives with wife  Please also refer to relevant sections of EMR.  Family History: Family History  Problem Relation Age of Onset  . Hypertension Mother   . Hyperlipidemia Mother   . Hypertension Sister   . Colon cancer Neg Hx   . Kidney disease Father   . Heart disease Brother     open heart surgery   Allergies and Medications: No Known Allergies No current facility-administered medications on file prior to encounter.   Current Outpatient Prescriptions on File Prior to Encounter  Medication Sig Dispense Refill  . amLODipine (NORVASC) 5 MG tablet Take 1 tablet (5 mg total) by mouth daily. 90 tablet 3  . atenolol (TENORMIN) 50 MG tablet Take 1 tablet (50 mg total) by mouth daily. 90  tablet 3  . atorvastatin (LIPITOR) 10 MG tablet Take 1 tablet (10 mg total) by mouth daily. 90 tablet 3  . Cholecalciferol (VITAMIN D-3 PO) Take by mouth.    . cyanocobalamin 500 MCG tablet Take 500 mcg by mouth daily. Reported on 12/02/2015    . FISH OIL-KRILL OIL PO Take by mouth.    [name] glipiZIDE (GLUCOTROL XL) 10 MG 24 hr tablet Take 1 tablet (10 mg total) by mouth daily with breakfast. 90 tablet 3  . GlucoCom Lancets MISC Use for home glucose monitoring 100 each 3  . glucose blood test strip Use to test blood sugar once daily. Dx: E11.9 100 each 3  . Multiple Vitamin (MULTIVITAMIN) tablet Take 1 tablet by mouth daily. Reported on 12/02/2015    . zoster vaccine live, PF, (ZOSTAVAX) 51884 UNT/0.65ML injection Inject 19,400 Units into the skin once.  0.65 mL 0    Objective: BP 162/92 mmHg  Pulse 84  Temp(Src) 99.1 F (37.3 C) (Oral)  Resp 22  Ht 5\' 7"  (1.702 m)  Wt 145 lb (65.772 kg)  BMI 22.71 kg/m2  SpO2 99% Exam: General: In NAD, sitting up in bed. Well developed and well nourished, wife at bedside Eyes: PERRLA, non icteric sclera ENTM: moist mucous membranes, no pharyngeal erythema Neck: supple, no lymphadenopathy Cardiovascular: RRR, no murmurs, no audible friction rub, +2 radial pulses, +1 DP, JVP appears to be ~7cm  Respiratory: CTAB, normal work of breathing on room air Abdomen: soft, non tender, normal bowel sounds, no masses MSK: +TTP to central chest, full ROM of all joints. No joint tenderness, no LE erythema, swelling or TTP Skin: No erythema, no rash Neuro: Alert and oriented x 3, CN 2-12 intact, 5/5 strength in bilateral UE and LE, sensation intact throughout Psych: normal mood and affect  Labs and Imaging: CBC BMET   Recent Labs Lab 12/02/15 1127  WBC 13.9*  HGB 11.8*  HCT 35.6*  PLT 311    Recent Labs Lab 12/02/15 1127  NA 136  K 4.0  CL 100*  CO2 27  BUN 39*  CREATININE 2.14*  GLUCOSE 155*  CALCIUM 9.7     Dg Chest 2 View  12/02/2015   CLINICAL DATA:  Chest pain.  Hypertensive yesterday. EXAM: CHEST  2 VIEW COMPARISON:  04/30/2014 FINDINGS: The cardiac silhouette is within normal limits for size. Thoracic aorta is mildly tortuous. The patient has taken a shallower inspiration than on the prior study. No confluent airspace opacity, edema, pleural effusion, or pneumothorax is identified. No acute osseous abnormality is seen. IMPRESSION: No active cardiopulmonary disease. Electronically Signed   By: Logan Bores M.D.   On: 12/02/2015 12:48   Carlyle Dolly, MD 12/02/2015, 3:02 PM PGY-1, [place] [facility] pager: [phone], text pages welcome  I have separately seen and examined the patient. I have discussed the findings and exam with Dr Juanito Doom and agree with the above note.  My changes/additions are outlined in BLUE.   Ashly M. Lajuana Ripple, DO PGY-2, [place]

## 2015-12-02 NOTE — ED Notes (Signed)
Patient here with [facility] that started this am 0300 with radiation to throat. No other associated symptoms. Son found patient to be hypertensive yesterday

## 2015-12-02 NOTE — Progress Notes (Signed)
By signing my name below, I, Mesha Guinyard, attest that this documentation has been prepared under the direction and in the presence of Merri Ray, MD.  Electronically Signed: Verlee Monte, Medical Scribe. 12/02/2015. 9:54 AM.  Subjective:    Patient ID: Adam Reid, male    DOB: 03-10-1946, 70 y.o.   MRN: [id number]  HPI  Chief Complaint  Patient presents with  . Chest Pain  . Sore Throat   Pt's son and wife was present to translate for him.  HPI Comments: Adam Reid is a 70 y.o. male with a PMHx of T2DM, chronic kindney disease, HTN, tobacco abuse and hyperlipidemia who presents to the [facility] complaining of waxing and waning chest pain onset 6 hours ago. Pt reports his bp reading was 200/100 yesterday morning. Pt's initial chest pain was rated at 7/10 at 3 am, and rates it at 3/10 currently. Pt locates the pain in the center of his chest into his throat. Pts throat pain worsens with deep breaths. Pt denies radiating back or arm pain.no associated diaphoresis or nausea/vomiting.   DM: Pt states his sugar levels have been around 172-173 for the past 2 days.  Pt has lost weight in the past few months and has experienced some numbness and blue coloring in his toes during that time, but improved after vitamin B12 supplements.  Pt denies having any numbness or tingling in toes currently. Pt hasn't recently taken a stress test - last one approximately 10 years ago.   Patient Active Problem List   Diagnosis Date Noted  . T2DM (type 2 diabetes mellitus) ([place]) 06/28/2014  . Hyperlipidemia LDL goal <100 06/28/2014  . HTN (hypertension) 06/24/2014  . Chronic kidney disease 06/24/2014  . BPH (benign prostatic hyperplasia) 06/24/2014   Past Medical History  Diagnosis Date  . Thyroid disease   . Hypertension   . Diabetes mellitus without complication ([place])   . Chronic kidney disease   . Hyperlipidemia    Past Surgical History  Procedure Laterality  Date  . Appendectomy     No Known Allergies Prior to Admission medications   Medication Sig Start Date End Date Taking? Authorizing Provider  amLODipine (NORVASC) 5 MG tablet Take 1 tablet (5 mg total) by mouth daily. 09/02/15  Yes Chelle Jeffery, PA-C  atenolol (TENORMIN) 50 MG tablet Take 1 tablet (50 mg total) by mouth daily. 09/02/15  Yes Chelle Jeffery, PA-C  atorvastatin (LIPITOR) 10 MG tablet Take 1 tablet (10 mg total) by mouth daily. 09/02/15  Yes Chelle Jeffery, PA-C  Cholecalciferol (VITAMIN D-3 PO) Take by mouth.   Yes Historical Provider, MD  FISH OIL-KRILL OIL PO Take by mouth.   Yes Historical Provider, MD  glipiZIDE (GLUCOTROL XL) 10 MG 24 hr tablet Take 1 tablet (10 mg total) by mouth daily with breakfast. 09/02/15  Yes Harrison Mons, PA-C  GlucoCom Lancets MISC Use for home glucose monitoring 09/02/15  Yes Chelle Jeffery, PA-C  glucose blood test strip Use to test blood sugar once daily. Dx: E11.9 11/06/15  Yes Chelle Jeffery, PA-C  zoster vaccine live, PF, (ZOSTAVAX) 52841 UNT/0.65ML injection Inject 19,400 Units into the skin once. 09/02/15  Yes Chelle Jeffery, PA-C  cyanocobalamin 500 MCG tablet Take 500 mcg by mouth daily. Reported on 12/02/2015    Historical Provider, MD  Multiple Vitamin (MULTIVITAMIN) tablet Take 1 tablet by mouth daily. Reported on 12/02/2015    Historical Provider, MD   Social History   Social History  . Marital Status: Married  Spouse Name: [name]  . Number of Children: 1  . Years of Education: 12th grade   Occupational History  . Retired-accountant    Social History Main Topics  . Smoking status: Current Every Day Smoker -- 0.25 packs/day for 48 years    Types: Cigarettes  . Smokeless tobacco: Never Used  . Alcohol Use: No  . Drug Use: No  . Sexual Activity: No   Other Topics Concern  . Not on file   Social History Narrative   Originally from [place]. Came to the [place] in 2009, following their son who came here for school.   Married.   Lives  with his wife.   Their adult son lives in [place], [place], where he is in optometry school.   Education: High School   Exercise: No   Review of Systems  Constitutional: Negative for diaphoresis, fatigue and unexpected weight change.  Eyes: Negative for visual disturbance.  Respiratory: Negative for cough, chest tightness and shortness of breath.        No difficulty breathing  Cardiovascular: Negative for chest pain, palpitations and leg swelling.  Gastrointestinal: Negative for nausea, abdominal pain and blood in stool.  Neurological: Negative for dizziness, light-headedness and headaches.    Objective:   Physical Exam  Constitutional: He is oriented to person, place, and time. He appears well-developed and well-nourished.  HENT:  Head: Normocephalic and atraumatic.  Mouth/Throat: Oropharynx is clear and moist. No oropharyngeal exudate.  Eyes: EOM are normal. Pupils are equal, round, and reactive to light.  Neck: No JVD present. Carotid bruit is not present.  Cardiovascular: Normal rate, regular rhythm and normal heart sounds.   No murmur heard. Pulmonary/Chest: Effort normal and breath sounds normal. No respiratory distress. He has no wheezes. He has no rales.  Did not reproduce pain with palpation Chest nontender  Musculoskeletal: He exhibits no edema.  NVI distillate cap refill less than 1 second  Neurological: He is alert and oriented to person, place, and time.  Skin: Skin is warm and dry.  Psychiatric: He has a normal mood and affect.  Vitals reviewed.  BP 111/68 mmHg  Pulse 80  Temp(Src) 98.7 F (37.1 C) (Oral)  Resp 18  Ht 5\' 7"  (1.702 m)  Wt 145 lb (65.772 kg)  BMI 22.71 kg/m2  SpO2 98%  EKG Reading: Sinus rhythm nonspecific T-wave in the lead III. No apparent acute findings.  Assessment & Plan:  Adam Reid is a 70 y.o. male Chest pain, unspecified chest pain type - Plan: EKG 12-Lead  Essential hypertension  Uncontrolled type 2 diabetes mellitus  without complication, without long-term current use of insulin (HCC)  Tobacco abuse   Approximate 6 hours of substernal chest pain with radiation to neck/throat. No associated diaphoresis nausea vomiting or dyspnea. No recent illness. Unable to reproduce pain on exam. Cardiac risk factors include age, tobacco abuse, uncontrolled diabetes, hyperlipidemia, hypertension. No acute findings on EKG, but concerning for angina versus non-Q-wave MI. Discussed EMS transport IV, but this was declined. Would like to go by private vehicle with his son. Charge nurse advised at [facility] ER. Will go to ER after leaving our office, 911 precautions if change in symptoms on the way there.  Weight loss, history of diabetes. Advise follow-up with primary care provider after emergency room visit for acute issue of chest pain as above.  No orders of the defined types were placed in this encounter.   Patient Instructions       IF you received an  x-ray today, you will receive an invoice from [facility]. Please contact [facility] at [phone] with questions or concerns regarding your invoice.   IF you received labwork today, you will receive an invoice from [employer]. Please contact [facility] at [phone] with questions or concerns regarding your invoice.   Our billing staff will not be able to assist you with questions regarding bills from these companies.  You will be contacted with the lab results as soon as they are available. The fastest way to get your results is to activate your My Chart account. Instructions are located on the last page of this paperwork. If you have not heard from [place] regarding the results in 2 weeks, please contact this office.     Based on your heart disease risk factors, I'm concerned about chest pain being heart related.  You do need to be seen in the emergency room as soon as you've a fever. If there is any change in your symptoms,  or worsening on the way the emergency room, call 911. Follow-up with Chelle after the emergency room visit to discuss diabetes and weight loss further.  Nonspecific Chest Pain  Chest pain can be caused by many different conditions. There is always a chance that your pain could be related to something serious, such as a heart attack or a blood clot in your lungs. Chest pain can also be caused by conditions that are not life-threatening. If you have chest pain, it is very important to follow up with your health care provider. CAUSES  Chest pain can be caused by:  Heartburn.  Pneumonia or bronchitis.  Anxiety or stress.  Inflammation around your heart (pericarditis) or lung (pleuritis or pleurisy).  A blood clot in your lung.  A collapsed lung (pneumothorax). It can develop suddenly on its own (spontaneous pneumothorax) or from trauma to the chest.  Shingles infection (varicella-zoster virus).  Heart attack.  Damage to the bones, muscles, and cartilage that make up your chest wall. This can include:  Bruised bones due to injury.  Strained muscles or cartilage due to frequent or repeated coughing or overwork.  Fracture to one or more ribs.  Sore cartilage due to inflammation (costochondritis). RISK FACTORS  Risk factors for chest pain may include:  Activities that increase your risk for trauma or injury to your chest.  Respiratory infections or conditions that cause frequent coughing.  Medical conditions or overeating that can cause heartburn.  Heart disease or family history of heart disease.  Conditions or health behaviors that increase your risk of developing a blood clot.  Having had chicken pox (varicella zoster). SIGNS AND SYMPTOMS Chest pain can feel like:  Burning or tingling on the surface of your chest or deep in your chest.  Crushing, pressure, aching, or squeezing pain.  Dull or sharp pain that is worse when you move, cough, or take a deep breath.  Pain  that is also felt in your back, neck, shoulder, or arm, or pain that spreads to any of these areas. Your chest pain may come and go, or it may stay constant. DIAGNOSIS Lab tests or other studies may be needed to find the cause of your pain. Your health care provider may have you take a test called an ambulatory ECG (electrocardiogram). An ECG records your heartbeat patterns at the time the test is performed. You may also have other tests, such as:  Transthoracic echocardiogram (TTE). During echocardiography, sound waves are used to create a picture of  all of the heart structures and to look at how blood flows through your heart.  Transesophageal echocardiogram (TEE).This is a more advanced imaging test that obtains images from inside your body. It allows your health care provider to see your heart in finer detail.  Cardiac monitoring. This allows your health care provider to monitor your heart rate and rhythm in real time.  Holter monitor. This is a portable device that records your heartbeat and can help to diagnose abnormal heartbeats. It allows your health care provider to track your heart activity for several days, if needed.  Stress tests. These can be done through exercise or by taking medicine that makes your heart beat more quickly.  Blood tests.  Imaging tests. TREATMENT  Your treatment depends on what is causing your chest pain. Treatment may include:  Medicines. These may include:  Acid blockers for heartburn.  Anti-inflammatory medicine.  Pain medicine for inflammatory conditions.  Antibiotic medicine, if an infection is present.  Medicines to dissolve blood clots.  Medicines to treat coronary artery disease.  Supportive care for conditions that do not require medicines. This may include:  Resting.  Applying heat or cold packs to injured areas.  Limiting activities until pain decreases. HOME CARE INSTRUCTIONS  If you were prescribed an antibiotic medicine,  finish it all even if you start to feel better.  Avoid any activities that bring on chest pain.  Do not use any tobacco products, including cigarettes, chewing tobacco, or electronic cigarettes. If you need help quitting, ask your health care provider.  Do not drink alcohol.  Take medicines only as directed by your health care provider.  Keep all follow-up visits as directed by your health care provider. This is important. This includes any further testing if your chest pain does not go away.  If heartburn is the cause for your chest pain, you may be told to keep your head raised (elevated) while sleeping. This reduces the chance that acid will go from your stomach into your esophagus.  Make lifestyle changes as directed by your health care provider. These may include:  Getting regular exercise. Ask your health care provider to suggest some activities that are safe for you.  Eating a heart-healthy diet. A registered dietitian can help you to learn healthy eating options.  Maintaining a healthy weight.  Managing diabetes, if necessary.  Reducing stress. SEEK MEDICAL CARE IF:  Your chest pain does not go away after treatment.  You have a rash with blisters on your chest.  You have a fever. SEEK IMMEDIATE MEDICAL CARE IF:   Your chest pain is worse.  You have an increasing cough, or you cough up blood.  You have severe abdominal pain.  You have severe weakness.  You faint.  You have chills.  You have sudden, unexplained chest discomfort.  You have sudden, unexplained discomfort in your arms, back, neck, or jaw.  You have shortness of breath at any time.  You suddenly start to sweat, or your skin gets clammy.  You feel nauseous or you vomit.  You suddenly feel light-headed or dizzy.  Your heart begins to beat quickly, or it feels like it is skipping beats. These symptoms may represent a serious problem that is an emergency. Do not wait to see if the symptoms  will go away. Get medical help right away. Call your local emergency services (911 in the [place] Do not drive yourself to the hospital.   This information is not intended to replace advice given  to you by your health care provider. Make sure you discuss any questions you have with your health care provider.   Document Released: 04/07/2005 Document Revised: 07/19/2014 Document Reviewed: 02/01/2014 Elsevier Interactive Patient Education [employer].     I personally performed the services described in this documentation, which was scribed in my presence. The recorded information has been reviewed and considered, and addended by me as needed.

## 2015-12-03 ENCOUNTER — Observation Stay (HOSPITAL_COMMUNITY): Payer: Medicare Other

## 2015-12-03 ENCOUNTER — Encounter (HOSPITAL_COMMUNITY): Payer: Self-pay | Admitting: Physician Assistant

## 2015-12-03 DIAGNOSIS — E1122 Type 2 diabetes mellitus with diabetic chronic kidney disease: Secondary | ICD-10-CM

## 2015-12-03 DIAGNOSIS — I208 Other forms of angina pectoris: Secondary | ICD-10-CM

## 2015-12-03 DIAGNOSIS — E119 Type 2 diabetes mellitus without complications: Secondary | ICD-10-CM

## 2015-12-03 DIAGNOSIS — R0789 Other chest pain: Secondary | ICD-10-CM | POA: Diagnosis not present

## 2015-12-03 DIAGNOSIS — R079 Chest pain, unspecified: Secondary | ICD-10-CM | POA: Diagnosis not present

## 2015-12-03 DIAGNOSIS — I10 Essential (primary) hypertension: Secondary | ICD-10-CM | POA: Diagnosis not present

## 2015-12-03 DIAGNOSIS — I160 Hypertensive urgency: Secondary | ICD-10-CM

## 2015-12-03 DIAGNOSIS — N400 Enlarged prostate without lower urinary tract symptoms: Secondary | ICD-10-CM | POA: Diagnosis not present

## 2015-12-03 DIAGNOSIS — N183 Chronic kidney disease, stage 3 (moderate): Secondary | ICD-10-CM | POA: Diagnosis not present

## 2015-12-03 LAB — HEMOGLOBIN A1C
HEMOGLOBIN A1C: 7.6 % — AB (ref 4.8–5.6)
MEAN PLASMA GLUCOSE: 171 mg/dL

## 2015-12-03 LAB — BASIC METABOLIC PANEL
Anion gap: 13 (ref 5–15)
BUN: 39 mg/dL — AB (ref 6–20)
CALCIUM: 9.4 mg/dL (ref 8.9–10.3)
CHLORIDE: 99 mmol/L — AB (ref 101–111)
CO2: 24 mmol/L (ref 22–32)
CREATININE: 1.97 mg/dL — AB (ref 0.61–1.24)
GFR calc Af Amer: 38 mL/min — ABNORMAL LOW (ref >60)
GFR calc non Af Amer: 33 mL/min — ABNORMAL LOW (ref >60)
Glucose, Bld: 138 mg/dL — ABNORMAL HIGH (ref 65–99)
Potassium: 3.6 mmol/L (ref 3.5–5.1)
SODIUM: 136 mmol/L (ref 135–145)

## 2015-12-03 LAB — GLUCOSE, CAPILLARY
GLUCOSE-CAPILLARY: 113 mg/dL — AB (ref 65–99)
Glucose-Capillary: 148 mg/dL — ABNORMAL HIGH (ref 65–99)

## 2015-12-03 LAB — CBC
HCT: 33.8 % — ABNORMAL LOW (ref 39.0–52.0)
Hemoglobin: 11.1 g/dL — ABNORMAL LOW (ref 13.0–17.0)
MCH: 27.1 pg (ref 26.0–34.0)
MCHC: 32.8 g/dL (ref 30.0–36.0)
MCV: 82.6 fL (ref 78.0–100.0)
PLATELETS: 258 10*3/uL (ref 150–400)
RBC: 4.09 MIL/uL — ABNORMAL LOW (ref 4.22–5.81)
RDW: 13.8 % (ref 11.5–15.5)
WBC: 11.3 10*3/uL — ABNORMAL HIGH (ref 4.0–10.5)

## 2015-12-03 LAB — TROPONIN I: Troponin I: 0.03 ng/mL (ref <0.031)

## 2015-12-03 MED ORDER — HYDRALAZINE HCL 20 MG/ML IJ SOLN
5.0000 mg | INTRAMUSCULAR | Status: DC | PRN
  Administered 2015-12-03: 5 mg via INTRAVENOUS
  Filled 2015-12-03: qty 1

## 2015-12-03 MED ORDER — ATORVASTATIN CALCIUM 80 MG PO TABS: 80.0000 mg | ORAL_TABLET | Freq: Every day | ORAL | Status: DC

## 2015-12-03 MED ORDER — LISINOPRIL 5 MG PO TABS: 5.0000 mg | ORAL_TABLET | Freq: Every day | ORAL | Status: DC

## 2015-12-03 MED ORDER — LISINOPRIL 5 MG PO TABS
5.0000 mg | ORAL_TABLET | Freq: Every day | ORAL | Status: DC
  Administered 2015-12-03: 5 mg via ORAL
  Filled 2015-12-03: qty 1

## 2015-12-03 MED ORDER — ASPIRIN 81 MG PO TBEC: 81.0000 mg | DELAYED_RELEASE_TABLET | Freq: Every day | ORAL | Status: DC

## 2015-12-03 NOTE — Discharge Summary (Signed)
[facility] Discharge Summary  Patient name: Adam Reid Medical record number: [id number] Date of birth: [birth year] Age: 71 y.o. 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)   Essential hypertension   Disposition: Discharge home  Discharge Condition: Stable  Discharge Exam: (as per Dr Willette Alma exam today) BP 126/70 mmHg  Pulse 78  Temp(Src) 98.1 F (36.7 C) (Oral)  Resp 18  Ht 5\' 7"  (1.702 m)  Wt 145 lb (65.772 kg)  BMI 22.71 kg/m2  SpO2 93% General: Laying in bed, in NAD. Well developed and well nourished, son at bedside HEENT: [place]/AT, EOMI, MMM Neck: supple, no lymphadenopathy Cardiovascular: RRR, no murmurs, 1+ DP pulses, no tenderness to palpation of sternum Respiratory: CTAB, normal work of breathing on room air Abdomen: soft, non tender, normal bowel sounds, no masses MSK: Full ROM of all joints. No joint tenderness, no LE erythema, swelling or TTP Skin: No erythema, no rash Neuro: Alert and oriented x 3, CN 2-12 intact, no focal deficits Psych: normal affect, normal behavior.  Brief Hospital Course:  Adam Reid is a 70 y.o. male that presented with chest pain . PMH is significant for T2DM, chronic kidney disease, HTN, tobacco abuse and hyperlipidemia   On admission, patient's heart score was a 6.  His EKG showed nonspecific ST/T wave changed.  Initial troponin was negative.  He had a low grade fever to 99.45F and a leukocytosis to 13.9.  Cr was baseline at 2.14.   Multiple differentials were considered including: MI, Pneumonia, PE, Pericarditis, GERD, costochondral etiology.  Because of patient's significant risk factors, cardiac etiology was highest amongst the differentials.  Patient was placed on telemetry.  His cardiac enzymes were cycled.  His ASCVD risk was calculated to be 40.9% on admission.  His Lipitor was increased to a high intensity dose.  He was also started on an ACE-I.  As he was already on a betablocker, no changes were made.  Consideration for alternative betablocker to be deferred to patient's outpatient provider.  He had risk stratification labs performed.  His A1c was found to have trended up from last check by PCP.  TSH within normal limits.  He was seen by Cardiology on Hospital day #2, who recommended myoview.  Patient preferred to have this done outpatient and cardiology was agreeable.  Discharge instructions were discussed with patient.  Return precautions reviewed.  He was discharged in stable condition with instructions to follow up with his PCP in the next week.  During hospitalization, at the request of the patient, patient's wife was utilized for [place] interpretation.   Issues for Follow Up:  1. Outpatient stress test 2. Blood pressure.  Started on Lisinopril 5mg  during hospitalization.  Would consider changing betablocker from Atenolol to a superior betablocker like Coreg or Metoprolol.  Significant Procedures: none  Significant Labs and Imaging:   Recent Labs Lab 12/02/15 1127 12/03/15 0440  WBC 13.9* 11.3*  HGB 11.8* 11.1*  HCT 35.6* 33.8*  PLT 311 258    Recent Labs Lab 12/02/15 1127 12/03/15 0440  NA 136 136  K 4.0 3.6  CL 100* 99*  CO2 27 24  GLUCOSE 155* 138*  BUN 39* 39*  CREATININE 2.14* 1.97*  CALCIUM 9.7 9.4   Cardiac Panel (last 3 results)  Recent Labs  12/02/15 1812 12/02/15 2227 12/03/15 0440  TROPONINI 0.03 0.03 0.03   Dg Chest 2 View  12/02/2015  CLINICAL DATA:  Chest pain.   Hypertensive yesterday. EXAM: CHEST  2 VIEW COMPARISON:  04/30/2014 FINDINGS: The cardiac silhouette is within normal limits for size. Thoracic aorta is mildly tortuous. The patient has taken a shallower inspiration than on the prior study. No confluent airspace opacity, edema, pleural effusion, or pneumothorax is identified. No acute osseous abnormality is seen. IMPRESSION: No active cardiopulmonary disease. Electronically Signed   By: Logan Bores M.D.   On: 12/02/2015 12:48   Results/Tests Pending at Time of Discharge: none  Discharge Medications:    Medication List    STOP taking these medications        zoster vaccine live (PF) 19400 UNT/0.65ML injection  Commonly known as:  ZOSTAVAX      TAKE these medications        amLODipine 5 MG tablet  Commonly known as:  NORVASC  Take 1 tablet (5 mg total) by mouth daily.     aspirin 81 MG EC tablet  Take 1 tablet (81 mg total) by mouth daily.     atenolol 50 MG tablet  Commonly known as:  TENORMIN  Take 1 tablet (50 mg total) by mouth daily.     atorvastatin 80 MG tablet  Commonly known as:  LIPITOR  Take 1 tablet (80 mg total) by mouth daily.     cyanocobalamin 500 MCG tablet  Take 500 mcg by mouth daily. Reported on 12/02/2015     FISH OIL-KRILL OIL PO  Take by mouth.     glipiZIDE 10 MG 24 hr tablet  Commonly known as:  GLUCOTROL XL  Take 1 tablet (10 mg total) by mouth daily with breakfast.     GlucoCom Lancets Misc  Use for home glucose monitoring     glucose blood test strip  Use to test blood sugar once daily. Dx: E11.9     lisinopril 5 MG tablet  Commonly known as:  PRINIVIL,ZESTRIL  Take 1 tablet (5 mg total) by mouth daily.     multivitamin tablet  Take 1 tablet by mouth daily. Reported on 12/02/2015     VITAMIN D-3 PO  Take by mouth.        Discharge Instructions: Please refer to Patient Instructions section of EMR for full details.  Patient was counseled important signs and symptoms that should prompt  return to medical care, changes in medications, dietary instructions, activity restrictions, and follow up appointments.   Follow-Up Appointments:     Follow-up Information    Follow up with JEFFERY,CHELLE, PA-C In 1 week.   Specialty:  Family Medicine   Why:  Please schedule a hospital follow-up appointment as soon as possible   Contact information:   [place] [id number] [id number]       Janora Norlander, DO 12/03/2015, 1:50 PM PGY-2, [place]

## 2015-12-03 NOTE — Consult Note (Signed)
CARDIOLOGY CONSULT NOTE   Patient ID: Adam Reid MRN: [id number] DOB/AGE: 12-28-1945 70 y.o.  Admit date: 12/02/2015  Primary Physician   JEFFERY,CHELLE, PA-C Primary Cardiologist   New Reason for Consultation   Chest pain  [id number] Adam Reid is a 70 y.o. year old male with a history of HTN, DM, CKD, HLD, admitted for chest pain and cards asked to see.   Adam Reid son and wife are with him in the room, the son translates for him.  Pt woke to get up to BR about 3-4 am yesterday. He noticed chest pain, never had before. The pain was in the lower half of the sternum, not sharp. 7/10, no change with deep inspiration or position. He describes it as "bone pain" repeatedly. No SOB, N&V or diaphoresis. He took no medications, tried to go back to sleep. The pain did not change at first, but when he got up and went to a previously scheduled primary MD appt, it was down to a 3/10. His throat was also sore. No cough, no fevers, no URI sx, no dysphagia, no GERD sx.    He got a GI cocktail in the ER, not sure if it helped the pain, helped the sore throat. By the time he went to bed last pm, the chest pain and sore throat was barely there, it was gone when he woke up.   Past Medical History  Diagnosis Date  . Thyroid disease   . Hypertension   . Diabetes mellitus without complication ([place])   . Chronic kidney disease   . Hyperlipidemia      Past Surgical History  Procedure Laterality Date  . Appendectomy      No Known Allergies  I have reviewed the patient's current medications . amLODipine  5 mg Oral Daily  . aspirin EC  81 mg Oral Daily  . atenolol  50 mg Oral Daily  . atorvastatin  80 mg Oral Daily  . heparin  5,000 Units Subcutaneous Q8H  . insulin aspart  0-9 Units Subcutaneous TID WC  . lisinopril  5 mg Oral Daily     acetaminophen, hydrALAZINE, nitroGLYCERIN, ondansetron (ZOFRAN) IV  Prior to Admission medications   Medication Sig Start Date End Date  Taking? Authorizing Provider  amLODipine (NORVASC) 5 MG tablet Take 1 tablet (5 mg total) by mouth daily. 09/02/15  Yes Chelle Jeffery, PA-C  atenolol (TENORMIN) 50 MG tablet Take 1 tablet (50 mg total) by mouth daily. 09/02/15  Yes Chelle Jeffery, PA-C  atorvastatin (LIPITOR) 10 MG tablet Take 1 tablet (10 mg total) by mouth daily. 09/02/15  Yes Chelle Jeffery, PA-C  Cholecalciferol (VITAMIN D-3 PO) Take by mouth.   Yes Historical Provider, MD  cyanocobalamin 500 MCG tablet Take 500 mcg by mouth daily. Reported on 12/02/2015   Yes Historical Provider, MD  FISH OIL-KRILL OIL PO Take by mouth.   Yes Historical Provider, MD  glipiZIDE (GLUCOTROL XL) 10 MG 24 hr tablet Take 1 tablet (10 mg total) by mouth daily with breakfast. 09/02/15  Yes Harrison Mons, PA-C  GlucoCom Lancets MISC Use for home glucose monitoring 09/02/15  Yes Chelle Jeffery, PA-C  glucose blood test strip Use to test blood sugar once daily. Dx: E11.9 11/06/15  Yes Chelle Jeffery, PA-C  Multiple Vitamin (MULTIVITAMIN) tablet Take 1 tablet by mouth daily. Reported on 12/02/2015   Yes Historical Provider, MD  aspirin EC 81 MG EC tablet Take 1 tablet (81 mg total) by mouth daily. 12/03/15  Sela Hua, MD  atorvastatin (LIPITOR) 80 MG tablet Take 1 tablet (80 mg total) by mouth daily. 12/03/15   Sela Hua, MD  lisinopril (PRINIVIL,ZESTRIL) 5 MG tablet Take 1 tablet (5 mg total) by mouth daily. 12/03/15   Sela Hua, MD  zoster vaccine live, PF, (ZOSTAVAX) 28413 UNT/0.65ML injection Inject 19,400 Units into the skin once. 09/02/15   Harrison Mons, PA-C     Social History   Social History  . Marital Status: Married    Spouse Name: Adam Reid  . Number of Children: 1  . Years of Education: 12th grade   Occupational History  . Retired-accountant    Social History Main Topics  . Smoking status: Current Every Day Smoker -- 0.25 packs/day for 48 years    Types: Cigarettes  . Smokeless tobacco: Never Used  . Alcohol Use: No  . Drug  Use: No  . Sexual Activity: No   Other Topics Concern  . Not on file   Social History Narrative   Originally from [place]. Came to the [place] in 2009, following their son who came here for school.   Married.   Lives with his wife.   Their adult son lives in [place], [place], where he is in optometry school.   Education: High School   Exercise: No    Family Status  Relation Status Death Age  . Mother Alive   . Sister Alive   . Father Deceased 29    renal failure  . Brother Alive   . Son Alive    Family History  Problem Relation Age of Onset  . Hypertension Mother   . Hyperlipidemia Mother   . Hypertension Sister   . Colon cancer Neg Hx   . Kidney disease Father   . Heart disease Brother     open heart surgery     ROS:  Full 14 point review of systems complete and found to be negative unless listed above.  Physical Exam: Blood pressure 155/85, pulse 86, temperature 99.3 F (37.4 C), temperature source Oral, resp. rate 18, height 5\' 7"  (1.702 m), weight 145 lb (65.772 kg), SpO2 97 %.  General: Well developed, well nourished, male in no acute distress Head: Eyes PERRLA, No xanthomas.   Normocephalic and atraumatic, oropharynx without edema or exudate. Dentition: fair Lungs: CTA bilaterally Heart: HRRR S1 S2, no rub/gallop, no murmur. pulses are 2+ all 4 extrem.   Neck: No carotid bruits. No lymphadenopathy.  JVD not elevated. Abdomen: Bowel sounds present, abdomen soft and non-tender without masses or hernias noted. Msk:  No spine or cva tenderness. No weakness, no joint deformities or effusions. Extremities: No clubbing or cyanosis. no edema.  Neuro: Alert and oriented X 3. No focal deficits noted. Psych:  Good affect, responds appropriately Skin: No rashes or lesions noted.  Labs:   Lab Results  Component Value Date   WBC 11.3* 12/03/2015   HGB 11.1* 12/03/2015   HCT 33.8* 12/03/2015   MCV 82.6 12/03/2015   PLT 258 12/03/2015    Recent Labs Lab 12/03/15 0440  NA 136   K 3.6  CL 99*  CO2 24  BUN 39*  CREATININE 1.97*  CALCIUM 9.4  GLUCOSE 138*    Recent Labs  12/02/15 1812 12/02/15 2227 12/03/15 0440  TROPONINI 0.03 0.03 0.03    Recent Labs  12/02/15 1139  TROPIPOC 0.01   Lab Results  Component Value Date   CHOL 145 09/02/2015   HDL 55 09/02/2015   LDLCALC  61 09/02/2015   TRIG 146 09/02/2015   TSH  Date/Time Value Ref Range Status  12/02/2015 06:12 PM 1.545 0.350 - 4.500 uIU/mL Final  09/02/2015 02:41 PM 5.19* 0.40 - 4.50 mIU/L Final    ECG:  SR, mild T wave changes from 2015, chronicity unclear  Radiology:  Dg Chest 2 View 12/02/2015  CLINICAL DATA:  Chest pain.  Hypertensive yesterday. EXAM: CHEST  2 VIEW COMPARISON:  04/30/2014 FINDINGS: The cardiac silhouette is within normal limits for size. Thoracic aorta is mildly tortuous. The patient has taken a shallower inspiration than on the prior study. No confluent airspace opacity, edema, pleural effusion, or pneumothorax is identified. No acute osseous abnormality is seen. IMPRESSION: No active cardiopulmonary disease. Electronically Signed   By: Logan Bores M.D.   On: 12/02/2015 12:48    ASSESSMENT AND PLAN:   The patient was seen today by Dr Debara Pickett, the patient evaluated and the data reviewed.  Principal Problem:   Chest pain with moderate risk for cardiac etiology - sx were prolonged, not exertional, and ez neg MI - ECG w/ minor T wave changes, no Q waves or ST changes - pt with CRFs of DM, HTN, HLD, FH - think ok to do MV today, Lexi since pt got atenolol 50 mg - pt kept mentioning "bone pain", if not cardiac, sx may have been MS  Otherwise, per IM.  Active Problems:   HTN (hypertension)   Chronic kidney disease   BPH (benign prostatic hyperplasia)   T2DM (type 2 diabetes mellitus) ([place])   Chest pain   Atypical angina [facility])   Essential hypertension   Signed: Rosaria Ferries, PA-C 12/03/2015 11:24 AM Beeper [id number]  Co-Sign MD

## 2015-12-03 NOTE — Care Management Obs Status (Signed)
[facility] NOTIFICATION   Patient Details  Name: Esa Sanderlin MRN: [id number] Date of Birth: 1946/06/20   Medicare Observation Status Notification Given:  Yes    Dawayne Patricia, RN 12/03/2015, 11:15 AM

## 2015-12-03 NOTE — Progress Notes (Signed)
Discharge paperwork (AVS) and scripts reviewed with pt and wife (translated what pt did not understand). Reviewed med changes and additions, need for f/u appointment and stress test as outpatient. All questions answered. IV d/c'd and tele removed, CCMD notified. Pt taken out via w/c with all belongings.

## 2015-12-03 NOTE — Discharge Instructions (Signed)
You were admitted to the hospital for evaluated for heart attack.  You had a negative work up in hospital.  You were seen by Cardiology, who recommends that you follow up outpatient for a stress test.  Your Lipitor was increased from 10mg  daily to 80 mg daily because your risk of heart attack/ stroke is almost 50% in the next 10 years.  You were also started on Lisinopril for blood pressure and protection of your kidneys.  Continue taking a daily aspirin.  Please make sure that you follow up with your primary care doctor in the next week for hospital follow up and blood labs (BMP).   Exercise Stress Electrocardiogram An exercise stress electrocardiogram is a test to check how blood flows to your heart. It is done to find areas of poor blood flow. You will need to walk on a treadmill for this test. The electrocardiogram will record your heartbeat when you are at rest and when you are exercising. BEFORE THE PROCEDURE  Do not have drinks with caffeine or foods with caffeine for 24 hours before the test, or as told by your doctor. This includes coffee, tea (even decaf tea), sodas, chocolate, and cocoa.  Follow your doctor's instructions about eating and drinking before the test.  Ask your doctor what medicines you should or should not take before the test. Take your medicines with water unless told by your doctor not to.  If you use an inhaler, bring it with you to the test.  Bring a snack to eat after the test.  Do not  smoke for 4 hours before the test.  Do not put lotions, powders, creams, or oils on your chest before the test.  Wear comfortable shoes and clothing. PROCEDURE  You will have patches put on your chest. Small areas of your chest may need to be shaved. Wires will be connected to the patches.  Your heart rate will be watched while you are resting and while you are exercising.  You will walk on the treadmill. The treadmill will slowly get faster to raise your heart rate.  The  test will take about 1-2 hours. AFTER THE PROCEDURE  Your heart rate and blood pressure will be watched after the test.  You may return to your normal diet, activities, and medicines or as told by your doctor.   This information is not intended to replace advice given to you by your health care provider. Make sure you discuss any questions you have with your health care provider.   Document Released: 12/15/2007 Document Revised: 07/19/2014 Document Reviewed: 03/05/2013 Elsevier Interactive Patient Education 2016 [place].  Heart Attack A heart attack (myocardial infarction, MI) causes damage to your heart that cannot be fixed. A heart attack can happen when a heart (coronary) artery becomes blocked or narrowed. This cuts off the blood supply and oxygen to your heart. When one or more of your coronary arteries becomes blocked, that area of your heart begins to die. This causes the pain you feel during a heart attack. Heart attack pain can also occur in one part of the body but be felt in another part of the body (referred pain). You may feel referred heart attack pain in your left arm, neck, or jaw. Pain may even be felt in the right arm. CAUSES  Many conditions can cause a heart attack. These include:   Atherosclerosis. This is when a fatty substance (plaque) gradually builds up in the arteries. This buildup can block or reduce the  blood supply to one or more of the heart arteries.  A blood clot. A blood clot can develop suddenly when plaque breaks up (ruptures) within a heart artery. A blood clot can block the heart artery, which prevents blood flow to the heart.   Severe tightening (spasm) of the heart artery. This cuts off blood flow through the artery.  RISK FACTORS People at risk for heart attack usually have one or more of the following risk factors:   High blood pressure (hypertension).  High cholesterol.  Smoking.  Being male.  Being overweight or obese.  Older  aged.   A family history of heart disease.  Lack of exercise.  Diabetes.  Stress.  Drinking too much alcohol.  Using illegal street drugs, such as cocaine and methamphetamines. SYMPTOMS  Heart attack symptoms can vary from person to person. Symptoms depend on factors like gender and age.   In both men and women, heart attack symptoms can include the following:   Chest pain. This may feel like crushing, squeezing, or a feeling of pressure.  Shortness of breath.  Heartburn or indigestion with or without vomiting, shortness of breath, or sweating.  Sudden cold sweats.  Sudden light-headedness.  Upper back pain.   Women can have unique heart attack symptoms, such as:   Unexplained feelings of nervousness or anxiety.  Discomfort between the shoulder blades or upper back.  Tingling in the hands and arms.   Older people (of both genders) can have subtle heart attack symptoms, such as:   Sweating.  Shortness of breath.  General tiredness or not feeling well.  DIAGNOSIS  Diagnosing a heart attack involves several tests. They include:   An assessment of your vital signs. This includes checking your:  Heart rhythm.  Blood pressure.  Breathing rate.  Oxygen level.   An ECG (electrocardiogram) to measure the electrical activity of your heart.  Blood tests called cardiac markers. In these tests, blood is drawn at scheduled times to check for the specific proteins or enzymes released by damaged heart muscle.  A chest X-ray.  An echocardiogram to evaluate heart motion and blood flow.  Coronary angiography to look at the heart arteries.  TREATMENT  Treatment for a heart attack may include:   Medicine that breaks up or dissolves blood clots in the heart artery.  Angioplasty.  Cardiac stent placement.  Intra-aortic balloon pump therapy (IABP).  Open heart surgery (coronary artery bypass graft, CABG). HOME CARE INSTRUCTIONS  Take medicines only  as directed by your health care provider. You may need to take medicine after a heart attack to:   Keep your blood from clotting too easily.  Control your blood pressure.  Lower your cholesterol.  Control abnormal heart rhythms.   Do not take the following medicines unless your health care provider approves:  Nonsteroidal anti-inflammatory drugs (NSAIDs), such as ibuprofen, naproxen, or celecoxib.  Vitamin supplements that contain vitamin A, vitamin E, or both.  Hormone replacement therapy that contains estrogen with or without progestin.  Make lifestyle changes as directed by your health care provider. These may include:   Quitting smoking, if you smoke.  Getting regular exercise. Ask your health care provider to suggest some activities that are safe for you.  Eating a heart-healthy diet. A registered dietitian can help you learn healthy eating options.  Maintaining a healthy weight.   Managing diabetes, if necessary.  Reducing stress.  Limiting how much alcohol you drink. SEEK IMMEDIATE MEDICAL CARE IF:   You have sudden,  unexplained chest discomfort.  You have sudden, unexplained discomfort in your arms, back, neck, or jaw.  You have shortness of breath at any time.  You suddenly start to sweat or your skin gets clammy.  You feel nauseous or vomit.  You suddenly feel light-headed or dizzy.  Your heart begins to beat fast or feels like it is skipping beats. These symptoms may represent a serious problem that is an emergency. Do not wait to see if the symptoms will go away. Get medical help right away. Call your local emergency services (911 in the [place] Do not drive yourself to the hospital.   This information is not intended to replace advice given to you by your health care provider. Make sure you discuss any questions you have with your health care provider.   Document Released: 06/28/2005 Document Revised: 07/19/2014 Document Reviewed:  08/31/2013 Elsevier Interactive Patient Education [employer].

## 2015-12-03 NOTE — Progress Notes (Signed)
Family Medicine Teaching Service Daily Progress Note Intern Pager: [phone]  Patient name: Adam Reid Medical record number: [id number] Date of birth: 07-17-1945 Age: 70 y.o. Gender: male  Primary Care Provider: JEFFERY,CHELLE, PA-C Consultants: Cardiology Code Status: Full  Pt Overview and Major Events to Date:  5/23: Admitted to [facility] for ACS rule out  Assessment and Plan: Adam Reid is a 70 y.o. male presenting with chest pain . PMH is significant for T2DM, chronic kidney disease, HTN, tobacco abuse and hyperlipidemia   # Atypical angina: ACS vs musculoskeletal vs GERD. Pt with significant risk factors for ACS. Heart score of 6. ASCVD risk 40.9%. EKG on admission with nonspecific ST and T wave changes.Repeat EKG this am pending. Troponins negative x 3. A1c 7.6%, TSH 1.545. - Telemetry - ASA, Nitro prn, GI cocktail prn - Lipitor increased from 10mg  to 80mg  - Continue Atenolol - Given his significant cardiovascular risk factors (HTN, DM, HLD, tobacco use), will consult cards for possible myoview - NPO after MDN in case cards want to perform additional studies  # HTN: BPs relatively well-controlled until this morning, elevated to 186/103. - Continue home meds: Norvasc 5mg  daily, Atenolol 50mg  daily - Will add Lisinopril 5mg  daily - Hydralazine prn  # DM2: Repeat A1c 7.6%. On Glipizide 10mg  as outpatient. CBGs 148-179. Required 1 unit Novolog. - sSSI while inpt - CBG monitoring qAC and HS  # HLD: Lipid panel performed outpatient 08/2015. Total 145, HDL 55, LDL 61. On Lipitor 10mg  outpatient. ASCVD risk 40.9%. - Start Lipitor 80mg  as above  # Tobacco abuse: Smokes 5 cig/day.  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 (baseline ~2.2). Sees Dr [name] outpatient at [facility]. - Avoid nephrotoxic agents - Daily BMETs  FEN/GI: HH/Carb mod diet Prophylaxis: Heparin sub-q  Disposition: Possible discharge home today, pending Cardiology  recommendations.  Subjective:  Pt doing much better this morning. He states he is having "very little chest pain". He is most bothered by his sore throat. No other concerns.  Objective: Temp:  [98.3 F (36.8 C)-99.3 F (37.4 C)] 99.3 F (37.4 C) (05/24 0600) Pulse Rate:  [75-84] 83 (05/24 0600) Resp:  [16-22] 18 (05/23 2003) BP: (111-186)/(68-106) 186/103 mmHg (05/24 0600) SpO2:  [97 %-100 %] 97 % (05/24 0600) Weight:  [145 lb (65.772 kg)] 145 lb (65.772 kg) (05/23 1129) Physical Exam: General: Laying in bed, in NAD. Well developed and well nourished, son at bedside HEENT: [place]/AT, EOMI, MMM Neck: supple, no lymphadenopathy Cardiovascular: RRR, no murmurs, 1+ DP pulses, no tenderness to palpation of sternum Respiratory: CTAB, normal work of breathing on room air Abdomen: soft, non tender, normal bowel sounds, no masses MSK: Full ROM of all joints. No joint tenderness, no LE erythema, swelling or TTP Skin: No erythema, no rash Neuro: Alert and oriented x 3, CN 2-12 intact, no focal deficits Psych: normal affect, normal behavior.  Laboratory:  Recent Labs Lab 12/02/15 1127 12/03/15 0440  WBC 13.9* 11.3*  HGB 11.8* 11.1*  HCT 35.6* 33.8*  PLT 311 258    Recent Labs Lab 12/02/15 1127 12/03/15 0440  NA 136 136  K 4.0 3.6  CL 100* 99*  CO2 27 24  BUN 39* 39*  CREATININE 2.14* 1.97*  CALCIUM 9.7 9.4  GLUCOSE 155* 138*   Troponins: 0.03 > 0.03 > 0.03 Hgb A1c 7.6% TSH 1.545  Imaging/Diagnostic Tests: EKG: non-specific ST/T wave changes  Sela Hua, MD 12/03/2015, 6:55 AM PGY-1, [place]  [facility] FPTS Intern pager: [phone], text pages welcome

## 2015-12-09 ENCOUNTER — Encounter: Payer: Self-pay | Admitting: Physician Assistant

## 2015-12-09 ENCOUNTER — Ambulatory Visit (INDEPENDENT_AMBULATORY_CARE_PROVIDER_SITE_OTHER): Payer: Medicare Other | Admitting: Physician Assistant

## 2015-12-09 VITALS — BP 124/70 | HR 69 | Temp 98.7°F | Resp 16 | Ht 66.5 in | Wt 146.2 lb

## 2015-12-09 DIAGNOSIS — E1122 Type 2 diabetes mellitus with diabetic chronic kidney disease: Secondary | ICD-10-CM | POA: Diagnosis not present

## 2015-12-09 DIAGNOSIS — K5900 Constipation, unspecified: Secondary | ICD-10-CM | POA: Diagnosis not present

## 2015-12-09 DIAGNOSIS — E785 Hyperlipidemia, unspecified: Secondary | ICD-10-CM | POA: Diagnosis not present

## 2015-12-09 DIAGNOSIS — R634 Abnormal weight loss: Secondary | ICD-10-CM | POA: Diagnosis not present

## 2015-12-09 DIAGNOSIS — F329 Major depressive disorder, single episode, unspecified: Secondary | ICD-10-CM

## 2015-12-09 DIAGNOSIS — I208 Other forms of angina pectoris: Secondary | ICD-10-CM | POA: Diagnosis not present

## 2015-12-09 DIAGNOSIS — N183 Chronic kidney disease, stage 3 unspecified: Secondary | ICD-10-CM

## 2015-12-09 DIAGNOSIS — F32A Depression, unspecified: Secondary | ICD-10-CM | POA: Insufficient documentation

## 2015-12-09 DIAGNOSIS — I10 Essential (primary) hypertension: Secondary | ICD-10-CM | POA: Diagnosis not present

## 2015-12-09 LAB — CBC WITH DIFFERENTIAL/PLATELET
BASOS ABS: 86 {cells}/uL (ref 0–200)
BASOS PCT: 1 %
EOS ABS: 172 {cells}/uL (ref 15–500)
Eosinophils Relative: 2 %
HEMATOCRIT: 33.3 % — AB (ref 38.5–50.0)
Hemoglobin: 10.9 g/dL — ABNORMAL LOW (ref 13.2–17.1)
Lymphocytes Relative: 12 %
Lymphs Abs: 1032 cells/uL (ref 850–3900)
MCH: 27.3 pg (ref 27.0–33.0)
MCHC: 32.7 g/dL (ref 32.0–36.0)
MCV: 83.3 fL (ref 80.0–100.0)
MONO ABS: 516 {cells}/uL (ref 200–950)
MPV: 10.9 fL (ref 7.5–12.5)
Monocytes Relative: 6 %
NEUTROS ABS: 6794 {cells}/uL (ref 1500–7800)
Neutrophils Relative %: 79 %
Platelets: 378 10*3/uL (ref 140–400)
RBC: 4 MIL/uL — ABNORMAL LOW (ref 4.20–5.80)
RDW: 14.4 % (ref 11.0–15.0)
WBC: 8.6 10*3/uL (ref 3.8–10.8)

## 2015-12-09 MED ORDER — CITALOPRAM HYDROBROMIDE 20 MG PO TABS: 20.0000 mg | ORAL_TABLET | Freq: Every day | ORAL | Status: DC

## 2015-12-09 MED ORDER — DAPAGLIFLOZIN PROPANEDIOL 5 MG PO TABS: 5.0000 mg | ORAL_TABLET | Freq: Every day | ORAL | Status: DC

## 2015-12-09 NOTE — Patient Instructions (Addendum)
Please continue the medications you are on.  Please ADD the new one: Farxiga, to better control the diabetes. It's important that you get plenty of water to drink when you take this medication, especially during the first 2 weeks.  I have referred you to the cardiology office for the stress test recommended when you were in the hospital. They may want to change the atenolol to another similar medication.  Start the new medication for your mood: citalopram (Celexa). Take one each day.  To help reduce constipation and promote bowel health, 1. Drink at least 64 ounces of water each day; 2. Eat plenty of fiber (fruits, vegetables, whole grains, legumes) 3. Get plenty of physical activity  If needed, use a stool softener (docusate) or an osmotic laxative (like Miralax) each day, or as needed.    For gas/bloating, try simethicone (Mylanta Gas or Bean-o)    IF you received an x-ray today, you will receive an invoice from [facility]. Please contact [facility] at [phone] with questions or concerns regarding your invoice.   IF you received labwork today, you will receive an invoice from [employer]. Please contact [facility] at [phone] with questions or concerns regarding your invoice.   Our billing staff will not be able to assist you with questions regarding bills from these companies.  You will be contacted with the lab results as soon as they are available. The fastest way to get your results is to activate your My Chart account. Instructions are located on the last page of this paperwork. If you have not heard from [place] regarding the results in 2 weeks, please contact this office.

## 2015-12-09 NOTE — Progress Notes (Signed)
 Patient ID: Adam Reid, male    DOB: 09/12/1945, 70 y.o.   MRN: [id number]  PCP: JEFFERY,CHELLE, PA-C  Subjective:   Chief Complaint  Patient presents with  . Follow-up    [facility] vs 12/02/2015  . chest pain    per pt 's wife" they did not find anything wrong", per pt he has no chest pain, "feeling good"  . hospital suggested that pt have a stress test    pt did not want to stay in the hospital to have stress test," would like referral made "  . Depression scale    score 7, during triage    HPI Patient presents today for evaluation following a recent hospitalization for chest pain. He is accompanied by his wife who translates Farsi. States he has improved since discharge, denies chest pain, shortness of breath, dyspnea, or recent changes in bowel or bladder habits.  He presented here one week ago for a routine follow-up of diabetes and HTN, but reported elevated BP (his son had checked it), and chest pain x 2 days. EKG revealed sinus rhythm with nonspecific T-wave change in lead III. BP here was 111/68. He was sent to the ED for further evaluation due to multiple cardiac risk factors (age, male sex, HTN, diabetes, hyperlipidemia and smoking).  In the ED he was stable, but was admitted for evaluation of possible ACS. Hospital course was uneventful. Enzymes were negative. Lipitor was increased to 80 mg, ACE-I was started. Recommendations for change to alternate beta blocker made. Cardiology made recommendation for myoview, but patient elected to have that done as an outpatient.  He complains of chronic constipation, BM 2x week, requiring straining with defecation. Denies pain or blood in stool.  Both he and his wife admit to feelings of hopelessness, sadness, depressed mood and weight loss ongoing for several months. He had a normal colonoscopy in 07/2015. CXR in hospital was normal. Normal TSH at his most recent previous visit with me in February.     Review of Systems    Constitutional: Positive for appetite change, fatigue and unexpected weight change. Negative for fever and chills.  HENT: Negative for congestion and trouble swallowing.   Eyes: Negative for visual disturbance.  Respiratory: Negative for cough, choking, chest tightness, shortness of breath and wheezing.   Cardiovascular: Negative for chest pain, palpitations and leg swelling.  Gastrointestinal: Positive for constipation. Negative for nausea, vomiting, abdominal pain, diarrhea, blood in stool, abdominal distention, anal bleeding and rectal pain.  Endocrine: Negative for cold intolerance, heat intolerance, polydipsia, polyphagia and polyuria.  Genitourinary: Negative for dysuria, urgency, frequency and hematuria.  Musculoskeletal: Negative for myalgias, arthralgias and gait problem.  Skin: Negative.   Allergic/Immunologic: Negative.   Neurological: Negative for dizziness, weakness, light-headedness and headaches.  Hematological: Negative for adenopathy. Does not bruise/bleed easily.  Psychiatric/Behavioral: Positive for dysphoric mood. Negative for suicidal ideas, sleep disturbance and self-injury. The patient is not nervous/anxious.        Patient Active Problem List   Diagnosis Date Noted  . Chest pain with moderate risk for cardiac etiology 12/02/2015  . Chest pain 12/02/2015  . Atypical angina (HCC) 12/02/2015  . Essential hypertension   . T2DM (type 2 diabetes mellitus) (HCC) 06/28/2014  . Hyperlipidemia LDL goal <100 06/28/2014  . HTN (hypertension) 06/24/2014  . Chronic kidney disease 06/24/2014  . BPH (benign prostatic hyperplasia) 06/24/2014     Prior to Admission medications   Medication Sig Start Date End Date Taking? Authorizing Provider  amLODipine (  NORVASC) 5 MG tablet Take 1 tablet (5 mg total) by mouth daily. 09/02/15  Yes Gerald Kuehl, PA-C  aspirin EC 81 MG EC tablet Take 1 tablet (81 mg total) by mouth daily. 12/03/15  Yes Sela Hua, MD  atenolol  (TENORMIN) 50 MG tablet Take 1 tablet (50 mg total) by mouth daily. 09/02/15  Yes Jahmarion Popoff, PA-C  atorvastatin (LIPITOR) 80 MG tablet Take 1 tablet (80 mg total) by mouth daily. 12/03/15  Yes Sela Hua, MD  Cholecalciferol (VITAMIN D-3 PO) Take by mouth.   Yes Historical Provider, MD  cyanocobalamin 500 MCG tablet Take 500 mcg by mouth daily. Reported on 12/02/2015   Yes Historical Provider, MD  FISH OIL-KRILL OIL PO Take by mouth.   Yes Historical Provider, MD  glipiZIDE (GLUCOTROL XL) 10 MG 24 hr tablet Take 1 tablet (10 mg total) by mouth daily with breakfast. 09/02/15  Yes Harrison Mons, PA-C  GlucoCom Lancets MISC Use for home glucose monitoring 09/02/15  Yes Marianna Cid, PA-C  glucose blood test strip Use to test blood sugar once daily. Dx: E11.9 11/06/15  Yes Yeray Tomas, PA-C  lisinopril (PRINIVIL,ZESTRIL) 5 MG tablet Take 1 tablet (5 mg total) by mouth daily. 12/03/15  Yes Sela Hua, MD  atorvastatin (LIPITOR) 10 MG tablet  10/08/15   Historical Provider, MD  Blood Glucose Monitoring Suppl (ONETOUCH VERIO) w/Device KIT USE UTD 09/03/15   Historical Provider, MD  Multiple Vitamin (MULTIVITAMIN) tablet Take 1 tablet by mouth daily. Reported on 12/09/2015    Historical Provider, MD     No Known Allergies     Objective:  Physical Exam  Constitutional: He is oriented to person, place, and time. He appears well-developed and well-nourished. He is active and cooperative. No distress.  BP 124/70 mmHg  Pulse 69  Temp(Src) 98.7 F (37.1 C) (Oral)  Resp 16  Ht 5' 6.5" (1.689 m)  Wt 146 lb 3.2 oz (66.316 kg)  BMI 23.25 kg/m2  SpO2 99%  HENT:  Head: Normocephalic and atraumatic.  Right Ear: Hearing normal.  Left Ear: Hearing normal.  Eyes: Conjunctivae are normal. No scleral icterus.  Neck: Normal range of motion. Neck supple. No thyromegaly present.  Cardiovascular: Normal rate, regular rhythm and normal heart sounds.   Pulses:      Radial pulses are 2+ on the right  side, and 2+ on the left side.  Pulmonary/Chest: Effort normal and breath sounds normal.  Lymphadenopathy:       Head (right side): No tonsillar, no preauricular, no posterior auricular and no occipital adenopathy present.       Head (left side): No tonsillar, no preauricular, no posterior auricular and no occipital adenopathy present.    He has no cervical adenopathy.       Right: No supraclavicular adenopathy present.       Left: No supraclavicular adenopathy present.  Neurological: He is alert and oriented to person, place, and time. No sensory deficit.  Skin: Skin is warm, dry and intact. No rash noted. No cyanosis or erythema. Nails show no clubbing.  Psychiatric: He has a normal mood and affect. His speech is normal and behavior is normal.           Assessment & Plan:   1. Essential hypertension COntrolled today. Continue current regimen. Cardiology may make change in beta blocker. - Ambulatory referral to Cardiology  2. Atypical angina ([place]) Needs myoview. - Ambulatory referral to Cardiology  3. Type 2 diabetes mellitus with stage 3  chronic kidney disease, unspecified long term insulin use status (HCC) Not as well controlled as would like. Add Farxiga 5 mg to current regimen. Recheck in 12-16 weeks. - dapagliflozin propanediol (FARXIGA) 5 MG TABS tablet; Take 5 mg by mouth daily.  Dispense: 90 tablet; Refill: 3  4. Chronic kidney disease, stage 3 (moderate) Await CBC. Follow-up with nephrology per their recommendations. - CBC with Differential/Platelet  5. Hyperlipidemia LDL goal <100 Continue high dose statin therapy. May discuss with cardiology his desire to reduce dose.  6. Constipation, unspecified constipation type OTC Miralax and simethicone for bloating.  7. Depression Start SSRI. - citalopram (CELEXA) 20 MG tablet; Take 1 tablet (20 mg total) by mouth daily.  Dispense: 90 tablet; Refill: 3  8. Loss of weight Reassured by normal colonoscopy and CXR.  Suspect that his depression is the cause of loss of appetite and loss of weight.   Chelle S. Jeffery, PA-C Physician Assistant-Certified [facility]  

## 2015-12-09 NOTE — Progress Notes (Signed)
Subjective:    Patient ID: Adam Reid, male    DOB: 1945-11-18, 70 y.o.   MRN: [id number]   Chief Complaint  Patient presents with  . [facility] vs 12/02/2015  . chest pain    per pt 's wife" they did not find anything wrong", per pt he has no chest pain, "feeling good"  . hospital suggested that pt have a stress test    pt did not want to stay in the hospital to have stress test," would like referral made "  . Depression scale    score 7, during triage   HPI  Patient presents today for evaluation following a recent hospitalization for chest pain.  States he has improved since discharge, denies chest pain, shortness of breath, dyspnea, or recent changes in bowel or bladder habits.  He complains of chronic constipation, BM 2x week, requiring straining with defecation.  Denies pain or blood in stool.  Both he and his wife admit to feelings of hopelessness, sadness, depressed mood and weight loss ongoing for several months.   Review of Systems  Constitutional: Positive for unexpected weight change.  Respiratory: Negative for chest tightness and shortness of breath.   Cardiovascular: Negative for chest pain and palpitations.  Gastrointestinal: Positive for constipation. Negative for blood in stool and rectal pain.  Neurological: Negative for dizziness.  Psychiatric/Behavioral: Positive for dysphoric mood.     Patient Active Problem List   Diagnosis Date Noted  . Constipation 12/09/2015  . Depression 12/09/2015  . Loss of weight 12/09/2015  . Atypical angina ([place]) 12/02/2015  . T2DM (type 2 diabetes mellitus) ([place]) 06/28/2014  . Hyperlipidemia LDL goal <100 06/28/2014  . HTN (hypertension) 06/24/2014  . Chronic kidney disease 06/24/2014  . BPH (benign prostatic hyperplasia) 06/24/2014   Current Outpatient Prescriptions on File Prior to Visit  Medication Sig Dispense Refill  . amLODipine (NORVASC) 5 MG tablet Take 1 tablet (5 mg total) by mouth daily. 90  tablet 3  . aspirin EC 81 MG EC tablet Take 1 tablet (81 mg total) by mouth daily. 30 tablet 5  . atenolol (TENORMIN) 50 MG tablet Take 1 tablet (50 mg total) by mouth daily. 90 tablet 3  . atorvastatin (LIPITOR) 80 MG tablet Take 1 tablet (80 mg total) by mouth daily. 30 tablet 5  . Cholecalciferol (VITAMIN D-3 PO) Take by mouth.    . cyanocobalamin 500 MCG tablet Take 500 mcg by mouth daily. Reported on 12/02/2015    . FISH OIL-KRILL OIL PO Take by mouth.    [name] glipiZIDE (GLUCOTROL XL) 10 MG 24 hr tablet Take 1 tablet (10 mg total) by mouth daily with breakfast. 90 tablet 3  . GlucoCom Lancets MISC Use for home glucose monitoring 100 each 3  . glucose blood test strip Use to test blood sugar once daily. Dx: E11.9 100 each 3  . lisinopril (PRINIVIL,ZESTRIL) 5 MG tablet Take 1 tablet (5 mg total) by mouth daily. 30 tablet 2  . Multiple Vitamin (MULTIVITAMIN) tablet Take 1 tablet by mouth daily. Reported on 12/09/2015     No current facility-administered medications on file prior to visit.   No Known Allergies    Objective:   Physical Exam  Constitutional: He is oriented to person, place, and time. He appears well-developed and well-nourished.  HENT:  Head: Normocephalic.  Neck: Normal range of motion. No thyromegaly present.  Cardiovascular: Normal rate, regular rhythm and normal heart sounds.   Pulmonary/Chest: Effort normal and  breath sounds normal. No respiratory distress.  Neurological: He is alert and oriented to person, place, and time.          Assessment & Plan:  Continue all current medications.  Patient to follow up in 4 months for continued medical management.  1. Essential hypertension - stable, well controlled on current medical regimen - Discharge notes indicate possible need to change beta blocker from atenolol to another beta blocker, will refer to cardiology for medication management.  2. Atypical angina ([name]) - Resolved currently - Refused stress test during  hospital stay, electing to do it as an outpatient - Ambulatory referral to Cardiology provided.  3. Type 2 diabetes mellitus with stage 3 chronic kidney disease, unspecified long term insulin use status ([place]) - Not optimally controlled on current regimen, A1c =7.6 on 12/03/15 - Added dapagliflozin propanediol (FARXIGA) 5 MG TABS tablet; Take 5 mg by mouth daily. Patient educated on importance to drink adequate amounts of water while taking this medication, especially in the first 2 weeks.  4. Chronic kidney disease, stage 3 (moderate) - CBC with Differential/Platelet collected and pending. - Patient followed by Nephrology  5. Hyperlipidemia LDL goal <100 - Lipid panel completed in January 2017 - Stable, continue on current medication regimen  6. Constipation, unspecified constipation type - Provided education about ways to help reduce constipation including: 1. Drink at least 64 oz of water daily 2. Eat plenty of fiber (fruits, vegetables, whole grains, legumes) 3. Get plenty of physical activity  If needed, use a stool softner (docusate) or an osmotic laxative (like Miralax) daily or when needed.  Simethicone (Mylanta Gas or Bean-O) can be used for gas/bloating.  7. Depression Discussed with patient how common depression can be, especially in patients with chronic illness, along with symptoms, different options for management and what could be expected from medical therapy. -Added citalopram (CELEXA) 20 MG tablet; Take 1 tablet (20 mg total) by mouth daily.   8. Loss of weight -Informed patient it is often found with depression, continue to monitor and call office if continued or large weight loss noted.  Shearon Clonch P. Bing Duffey, PA-S

## 2015-12-12 DIAGNOSIS — I129 Hypertensive chronic kidney disease with stage 1 through stage 4 chronic kidney disease, or unspecified chronic kidney disease: Secondary | ICD-10-CM | POA: Diagnosis not present

## 2015-12-12 DIAGNOSIS — E1129 Type 2 diabetes mellitus with other diabetic kidney complication: Secondary | ICD-10-CM | POA: Diagnosis not present

## 2015-12-12 DIAGNOSIS — E7800 Pure hypercholesterolemia, unspecified: Secondary | ICD-10-CM | POA: Diagnosis not present

## 2015-12-12 DIAGNOSIS — R079 Chest pain, unspecified: Secondary | ICD-10-CM | POA: Diagnosis not present

## 2015-12-24 DIAGNOSIS — R0789 Other chest pain: Secondary | ICD-10-CM | POA: Diagnosis not present

## 2015-12-24 DIAGNOSIS — E1121 Type 2 diabetes mellitus with diabetic nephropathy: Secondary | ICD-10-CM | POA: Diagnosis not present

## 2015-12-24 DIAGNOSIS — N183 Chronic kidney disease, stage 3 (moderate): Secondary | ICD-10-CM | POA: Diagnosis not present

## 2015-12-24 DIAGNOSIS — R079 Chest pain, unspecified: Secondary | ICD-10-CM | POA: Diagnosis not present

## 2015-12-24 DIAGNOSIS — E785 Hyperlipidemia, unspecified: Secondary | ICD-10-CM | POA: Diagnosis not present

## 2015-12-24 DIAGNOSIS — I10 Essential (primary) hypertension: Secondary | ICD-10-CM | POA: Diagnosis not present

## 2016-01-13 ENCOUNTER — Encounter: Payer: Self-pay | Admitting: Physician Assistant

## 2016-01-29 ENCOUNTER — Encounter (HOSPITAL_COMMUNITY): Payer: Self-pay

## 2016-01-29 ENCOUNTER — Inpatient Hospital Stay (HOSPITAL_COMMUNITY): Payer: Medicare Other

## 2016-01-29 ENCOUNTER — Inpatient Hospital Stay (HOSPITAL_COMMUNITY)
Admission: AD | Admit: 2016-01-29 | Discharge: 2016-01-31 | DRG: 287 | Disposition: A | Discharge status: Home / Self Care | Payer: Medicare Other | Source: Ambulatory Visit | Attending: Cardiology | Admitting: Cardiology

## 2016-01-29 DIAGNOSIS — I5022 Chronic systolic (congestive) heart failure: Secondary | ICD-10-CM | POA: Diagnosis present

## 2016-01-29 DIAGNOSIS — R9439 Abnormal result of other cardiovascular function study: Secondary | ICD-10-CM | POA: Diagnosis present

## 2016-01-29 DIAGNOSIS — I130 Hypertensive heart and chronic kidney disease with heart failure and stage 1 through stage 4 chronic kidney disease, or unspecified chronic kidney disease: Secondary | ICD-10-CM | POA: Diagnosis not present

## 2016-01-29 DIAGNOSIS — N183 Chronic kidney disease, stage 3 (moderate): Secondary | ICD-10-CM | POA: Diagnosis not present

## 2016-01-29 DIAGNOSIS — F17210 Nicotine dependence, cigarettes, uncomplicated: Secondary | ICD-10-CM | POA: Diagnosis present

## 2016-01-29 DIAGNOSIS — Z7982 Long term (current) use of aspirin: Secondary | ICD-10-CM | POA: Diagnosis not present

## 2016-01-29 DIAGNOSIS — I10 Essential (primary) hypertension: Secondary | ICD-10-CM | POA: Diagnosis not present

## 2016-01-29 DIAGNOSIS — I2510 Atherosclerotic heart disease of native coronary artery without angina pectoris: Secondary | ICD-10-CM

## 2016-01-29 DIAGNOSIS — E1121 Type 2 diabetes mellitus with diabetic nephropathy: Secondary | ICD-10-CM | POA: Diagnosis not present

## 2016-01-29 DIAGNOSIS — Z7984 Long term (current) use of oral hypoglycemic drugs: Secondary | ICD-10-CM | POA: Diagnosis not present

## 2016-01-29 DIAGNOSIS — Z79899 Other long term (current) drug therapy: Secondary | ICD-10-CM | POA: Diagnosis not present

## 2016-01-29 DIAGNOSIS — E1122 Type 2 diabetes mellitus with diabetic chronic kidney disease: Secondary | ICD-10-CM | POA: Diagnosis present

## 2016-01-29 DIAGNOSIS — N184 Chronic kidney disease, stage 4 (severe): Secondary | ICD-10-CM | POA: Diagnosis present

## 2016-01-29 DIAGNOSIS — E785 Hyperlipidemia, unspecified: Secondary | ICD-10-CM | POA: Diagnosis not present

## 2016-01-29 DIAGNOSIS — I340 Nonrheumatic mitral (valve) insufficiency: Secondary | ICD-10-CM | POA: Diagnosis not present

## 2016-01-29 DIAGNOSIS — R0789 Other chest pain: Secondary | ICD-10-CM | POA: Diagnosis not present

## 2016-01-29 DIAGNOSIS — I959 Hypotension, unspecified: Principal | ICD-10-CM | POA: Diagnosis present

## 2016-01-29 DIAGNOSIS — I248 Other forms of acute ischemic heart disease: Secondary | ICD-10-CM | POA: Diagnosis not present

## 2016-01-29 DIAGNOSIS — R42 Dizziness and giddiness: Secondary | ICD-10-CM | POA: Diagnosis not present

## 2016-01-29 LAB — COMPREHENSIVE METABOLIC PANEL
ALT: 22 U/L (ref 17–63)
ANION GAP: 9 (ref 5–15)
AST: 21 U/L (ref 15–41)
Albumin: 4.2 g/dL (ref 3.5–5.0)
Alkaline Phosphatase: 90 U/L (ref 38–126)
BUN: 47 mg/dL — ABNORMAL HIGH (ref 6–20)
CHLORIDE: 103 mmol/L (ref 101–111)
CO2: 25 mmol/L (ref 22–32)
CREATININE: 2.52 mg/dL — AB (ref 0.61–1.24)
Calcium: 9 mg/dL (ref 8.9–10.3)
GFR, EST AFRICAN AMERICAN: 29 mL/min — AB (ref >60)
GFR, EST NON AFRICAN AMERICAN: 25 mL/min — AB (ref >60)
Glucose, Bld: 178 mg/dL — ABNORMAL HIGH (ref 65–99)
Potassium: 4 mmol/L (ref 3.5–5.1)
Sodium: 137 mmol/L (ref 135–145)
Total Bilirubin: 0.5 mg/dL (ref 0.3–1.2)
Total Protein: 7 g/dL (ref 6.5–8.1)

## 2016-01-29 LAB — LIPID PANEL
Cholesterol: 135 mg/dL (ref 0–200)
HDL: 49 mg/dL (ref >40)
LDL CALC: 36 mg/dL (ref 0–99)
Total CHOL/HDL Ratio: 2.8 RATIO
Triglycerides: 251 mg/dL — ABNORMAL HIGH (ref <150)
VLDL: 50 mg/dL — AB (ref 0–40)

## 2016-01-29 LAB — CBC WITH DIFFERENTIAL/PLATELET
BASOS ABS: 0.1 10*3/uL (ref 0.0–0.1)
BASOS PCT: 1 %
EOS PCT: 1 %
Eosinophils Absolute: 0.1 10*3/uL (ref 0.0–0.7)
HEMATOCRIT: 35.1 % — AB (ref 39.0–52.0)
Hemoglobin: 11.3 g/dL — ABNORMAL LOW (ref 13.0–17.0)
Lymphocytes Relative: 16 %
Lymphs Abs: 1.6 10*3/uL (ref 0.7–4.0)
MCH: 27.2 pg (ref 26.0–34.0)
MCHC: 32.2 g/dL (ref 30.0–36.0)
MCV: 84.6 fL (ref 78.0–100.0)
MONO ABS: 0.5 10*3/uL (ref 0.1–1.0)
Monocytes Relative: 5 %
NEUTROS ABS: 7.7 10*3/uL (ref 1.7–7.7)
Neutrophils Relative %: 77 %
Platelets: 319 10*3/uL (ref 150–400)
RBC: 4.15 MIL/uL — ABNORMAL LOW (ref 4.22–5.81)
RDW: 14.4 % (ref 11.5–15.5)
WBC: 9.9 10*3/uL (ref 4.0–10.5)

## 2016-01-29 LAB — GLUCOSE, CAPILLARY: Glucose-Capillary: 94 mg/dL (ref 65–99)

## 2016-01-29 LAB — TSH: TSH: 2.49 u[IU]/mL (ref 0.350–4.500)

## 2016-01-29 LAB — PROTIME-INR
INR: 1.02 (ref 0.00–1.49)
PROTHROMBIN TIME: 13.6 s (ref 11.6–15.2)

## 2016-01-29 LAB — TROPONIN I: TROPONIN I: 0.22 ng/mL — AB (ref <0.03)

## 2016-01-29 LAB — APTT: APTT: 31 s (ref 24–37)

## 2016-01-29 MED ORDER — SODIUM CHLORIDE 0.9 % WEIGHT BASED INFUSION
1.0000 mL/kg/h | INTRAVENOUS | Status: DC
  Administered 2016-01-30: 1 mL/kg/h via INTRAVENOUS

## 2016-01-29 MED ORDER — HEPARIN BOLUS VIA INFUSION
3000.0000 [IU] | Freq: Once | INTRAVENOUS | Status: AC
  Administered 2016-01-29: 3000 [IU] via INTRAVENOUS
  Filled 2016-01-29: qty 3000

## 2016-01-29 MED ORDER — INSULIN ASPART 100 UNIT/ML ~~LOC~~ SOLN
0.0000 [IU] | Freq: Three times a day (TID) | SUBCUTANEOUS | Status: DC
  Administered 2016-01-30: 3 [IU] via SUBCUTANEOUS
  Administered 2016-01-31 (×2): 1 [IU] via SUBCUTANEOUS

## 2016-01-29 MED ORDER — CANAGLIFLOZIN 100 MG PO TABS: 100.0000 mg | ORAL_TABLET | Freq: Every day | ORAL | Status: DC

## 2016-01-29 MED ORDER — ATENOLOL 25 MG PO TABS
50.0000 mg | ORAL_TABLET | Freq: Every day | ORAL | Status: DC
  Administered 2016-01-30: 50 mg via ORAL
  Filled 2016-01-29: qty 2

## 2016-01-29 MED ORDER — ACETAMINOPHEN 325 MG PO TABS: 650.0000 mg | ORAL_TABLET | ORAL | Status: DC | PRN

## 2016-01-29 MED ORDER — HEPARIN (PORCINE) IN NACL 100-0.45 UNIT/ML-% IJ SOLN
900.0000 [IU]/h | INTRAMUSCULAR | Status: DC
  Administered 2016-01-29: 750 [IU]/h via INTRAVENOUS
  Filled 2016-01-29: qty 250

## 2016-01-29 MED ORDER — ASPIRIN EC 81 MG PO TBEC
81.0000 mg | DELAYED_RELEASE_TABLET | Freq: Every day | ORAL | Status: DC
  Administered 2016-01-31: 81 mg via ORAL
  Filled 2016-01-29: qty 1

## 2016-01-29 MED ORDER — ASPIRIN EC 81 MG PO TBEC: 81.0000 mg | DELAYED_RELEASE_TABLET | Freq: Every day | ORAL | Status: DC

## 2016-01-29 MED ORDER — SODIUM CHLORIDE 0.9 % WEIGHT BASED INFUSION
3.0000 mL/kg/h | INTRAVENOUS | Status: DC
  Administered 2016-01-30: 3 mL/kg/h via INTRAVENOUS

## 2016-01-29 MED ORDER — ONDANSETRON HCL 4 MG/2ML IJ SOLN: 4.0000 mg | Freq: Four times a day (QID) | INTRAMUSCULAR | Status: DC | PRN

## 2016-01-29 MED ORDER — SODIUM CHLORIDE 0.9 % IV SOLN
INTRAVENOUS | Status: DC
  Administered 2016-01-29: 18:00:00 via INTRAVENOUS

## 2016-01-29 MED ORDER — GLIPIZIDE ER 10 MG PO TB24
10.0000 mg | ORAL_TABLET | Freq: Every day | ORAL | Status: DC
  Administered 2016-01-31: 10 mg via ORAL
  Filled 2016-01-29 (×2): qty 1

## 2016-01-29 MED ORDER — NITROGLYCERIN 0.4 MG SL SUBL: 0.4000 mg | SUBLINGUAL_TABLET | SUBLINGUAL | Status: DC | PRN

## 2016-01-29 MED ORDER — ASPIRIN 81 MG PO CHEW
81.0000 mg | CHEWABLE_TABLET | ORAL | Status: AC
  Administered 2016-01-30: 81 mg via ORAL
  Filled 2016-01-29: qty 1

## 2016-01-29 MED ORDER — ATORVASTATIN CALCIUM 80 MG PO TABS
80.0000 mg | ORAL_TABLET | Freq: Every day | ORAL | Status: DC
  Administered 2016-01-30 – 2016-01-31 (×2): 80 mg via ORAL
  Filled 2016-01-29 (×2): qty 1

## 2016-01-29 MED ORDER — CITALOPRAM HYDROBROMIDE 20 MG PO TABS
20.0000 mg | ORAL_TABLET | Freq: Every day | ORAL | Status: DC
  Administered 2016-01-30 – 2016-01-31 (×2): 20 mg via ORAL
  Filled 2016-01-29 (×2): qty 1

## 2016-01-29 NOTE — Progress Notes (Signed)
Pt noted to have (+) troponin at 0.22. Pt with no complaints of chest pain or shortness of breath. IV heparin running per order. Plan is for cardiac cath in the am. Will continue to monitor closely.

## 2016-01-29 NOTE — H&P (Addendum)
History and Physical   Admit date: 01/29/2016 Name:  Adam Reid Medical record number: [id number] DOB/Age:  June 20, 1946  70 y.o. male  Primary Cardiologist:  Wynonia Lawman   Primary Physician:   [facility]  Chief complaint/reason for admission: abnormal stress test  HPI:  This 70 year old male is admitted to the hospital following hypertension and an abnormal stress test. The patient has a history of diabetes mellitus with a history of chronic kidney disease and diabetes. He also has known hypertension and hyperlipidemia. In May he was admitted to the hospital with chest discomfort that awakened him from sleep and had negative enzymes. He was seen by cardiology during that admission but did not wish to stay for stress testing and was discharged home. He was later given appointments in my office several weeks later. Stress testing was advised. In the intervening time he had not had any recurrent chest discomfort and was feeling fairly well. Today he underwent myocardial perfusion scanning. He walked 8 minutes on the treadmill and had some nausea and shortness of breath and vague chest tightness with exercise with 2 mm ST depression laterally and some PAC's and brief atrial runs with exercise.. When he got to the nuclear camera for the second set of pictures he was quite lethargic weak and was found to be hypotensive. A repeat EKG was normal and he was given 750 cc of normal saline and his blood pressure came up was never much above 110. The myocardial perfusion scan showed transient ischemic dilation with a large partially reversible defect in the inferolateral wall and an ejection fraction of 37%. Because of his hypotension, the language barrier and strongly positive nuclear stress test hospitalization was advised as this is a high-risk situation with some hypotension.    Past Medical History  Diagnosis Date  . Thyroid disease     was on supplement, taken off by [name]  . Hypertension   .  Diabetes mellitus without complication ([place])   . Chronic kidney disease   . Hyperlipidemia       Past Surgical History  Procedure Laterality Date  . Appendectomy     Allergies: has No Known Allergies.   Medications: Prior to Admission medications   Medication Sig Start Date End Date Taking? Authorizing Provider  amLODipine (NORVASC) 5 MG tablet Take 1 tablet (5 mg total) by mouth daily. 09/02/15   Chelle Jeffery, PA-C  aspirin EC 81 MG EC tablet Take 1 tablet (81 mg total) by mouth daily. 12/03/15   Sela Hua, MD  atenolol (TENORMIN) 50 MG tablet Take 1 tablet (50 mg total) by mouth daily. 09/02/15   Chelle Jeffery, PA-C  atorvastatin (LIPITOR) 10 MG tablet  10/08/15   Historical Provider, MD  atorvastatin (LIPITOR) 80 MG tablet Take 1 tablet (80 mg total) by mouth daily. 12/03/15   Sela Hua, MD  Blood Glucose Monitoring Suppl [facility] VERIO) w/Device KIT USE UTD 09/03/15   Historical Provider, MD  Cholecalciferol (VITAMIN D-3 PO) Take by mouth.    Historical Provider, MD  citalopram (CELEXA) 20 MG tablet Take 1 tablet (20 mg total) by mouth daily. 12/09/15   Chelle Jeffery, PA-C  cyanocobalamin 500 MCG tablet Take 500 mcg by mouth daily. Reported on 12/02/2015    Historical Provider, MD  dapagliflozin propanediol (FARXIGA) 5 MG TABS tablet Take 5 mg by mouth daily. 12/09/15   Chelle Jeffery, PA-C  FISH OIL-KRILL OIL PO Take by mouth.    Historical Provider, MD  glipiZIDE (GLUCOTROL XL)  10 MG 24 hr tablet Take 1 tablet (10 mg total) by mouth daily with breakfast. 09/02/15   Harrison Mons, PA-C  GlucoCom Lancets MISC Use for home glucose monitoring 09/02/15   Chelle Jeffery, PA-C  glucose blood test strip Use to test blood sugar once daily. Dx: E11.9 11/06/15   Chelle Jeffery, PA-C  lisinopril (PRINIVIL,ZESTRIL) 5 MG tablet Take 1 tablet (5 mg total) by mouth daily. 12/03/15   Sela Hua, MD  Multiple Vitamin (MULTIVITAMIN) tablet Take 1 tablet by mouth daily. Reported on 12/09/2015     Historical Provider, MD   Family History:  Family Status  Relation Status Death Age  . Mother Alive   . Sister Alive   . Father Deceased 70    renal failure  . Brother Alive   . Son Alive    Social History:   reports that he has been smoking Cigarettes.  He has a 12 pack-year smoking history. He has never used smokeless tobacco. He reports that he does not drink alcohol or use illicit drugs.   Social History   Social History Narrative   Originally from [place]. Came to the [place] in 2009, following their son who came here for school.   Married.   Lives with his wife.   Their adult son lives in [place], [place], where he is in optometry school.   Education: High School   Exercise: No     Review of Systems: Other than as noted above, the remainder of the review of systems is normal  Physical Exam: BP 100/66 mmHg  Pulse 61  Temp(Src) 98 F (36.7 C) (Oral)  Resp 16  Ht 5' 7" (1.702 m)  Wt 64.728 kg (142 lb 11.2 oz)  BMI 22.34 kg/m2  SpO2 100% General appearance: Pleasant middle eas[place] WM in NAD Head: Normocephalic, without obvious abnormality, atraumatic Eyes: conjunctivae/corneas clear. PERRL, EOM's intact. Fundi not examined Neck: no adenopathy, no carotid bruit, no JVD and supple, symmetrical, trachea midline Lungs: clear to auscultation bilaterally Heart: regular rate and rhythm, S1, S2 normal, no murmur, click, rub or gallop Abdomen: soft, non-tender; bowel sounds normal; no masses,  no organomegaly Rectal: deferred Extremities: extremities normal, atraumatic, no cyanosis or edema Pulses: 2+ and symmetric Skin: Skin color, texture, turgor normal. No rashes or lesions Neurologic: Grossly normal  Labs: Pending at time of admission  EKG: Normal earlier   IMPRESSIONS:  1. High-risk nuclear stress test with posttest hypotension 2. Diabetes mellitus with chronic kidney disease 3. Hypertension 4. Hyperlipidemia  Recommendations:  He will be admitted and have lab  work. He will have intravenous fluids overnight and we will plan catheterization tomorrow to evaluate for significant coronary artery disease. The patient does not speak any [place] and is a native of [place]. His wife is able to serve as interpreter for him so interpreter services will need to be provided.Cardiac catheterization was discussed with the patient including risks of myocardial infarction, death, stroke, bleeding, arrhythmia, dye allergy, or renal insufficiency. He understands and is willing to proceed. Possibility of percutaneous intervention at the same setting was also discussed with the patient including risks.  PLAN:   Signed: Kerry Hough MD [facility] Cardiology  01/29/2016, 4:44 PM

## 2016-01-29 NOTE — Progress Notes (Signed)
  Received call from Pharmacy that patient should not be on Invokana with his CKD, especially with his creatinine trending up from 1.97 to 2.52. Will discontinue this. Continue Glipizide and SSI for now.  Signed, Erma Heritage, PA-C 01/29/2016, 7:35 PM

## 2016-01-29 NOTE — Progress Notes (Signed)
ANTICOAGULATION CONSULT NOTE - Initial Consult  Pharmacy Consult for Heparin Indication: chest pain/ACS  No Known Allergies  Patient Measurements: Height: 5\' 7"  (170.2 cm) Weight: 142 lb 11.2 oz (64.728 kg) IBW/kg (Calculated) : 66.1 Heparin Dosing Weight: 65  Vital Signs: Temp: 98 F (36.7 C) (07/20 1537) Temp Source: Oral (07/20 1537) BP: 100/66 mmHg (07/20 1537) Pulse Rate: 61 (07/20 1537)  Labs: No results for input(s): HGB, HCT, PLT, APTT, LABPROT, INR, HEPARINUNFRC, HEPRLOWMOCWT, CREATININE, CKTOTAL, CKMB, TROPONINI in the last 72 hours.  CrCl cannot be calculated (Patient has no serum creatinine result on file.).   Medical History: Past Medical History  Diagnosis Date  . Thyroid disease     was on supplement, taken off by [name]  . Hypertension   . Diabetes mellitus without complication ([place])   . Chronic kidney disease   . Hyperlipidemia     Medications:  Scheduled:  . [START ON 01/30/2016] aspirin EC  81 mg Oral Daily  . atenolol  50 mg Oral Daily  . atorvastatin  80 mg Oral Daily  . [START ON 01/30/2016] canagliflozin  100 mg Oral QAC breakfast  . citalopram  20 mg Oral Daily  . [START ON 01/30/2016] glipiZIDE  10 mg Oral Q breakfast    Assessment: 70yo diabetic male with chronic kidney disease admitted due to abnormal stress test with plans for cath tomorrow- to start heparin.   No heparin has been given.  11/2015:  Cr 1.97, Hg 11.1, pltc wnl  Goal of Therapy:  Heparin level 0.3-0.7 units/ml Monitor platelets by anticoagulation protocol: Yes   Plan:  Heparin 3000 units IV x 1, 750 units/hr Check anti-Xa level in 6hr and then daily Daily CBC Watch for s/s of bleeding.  Gracy Bruins, PharmD Clinical Pharmacist [facility]

## 2016-01-30 ENCOUNTER — Encounter (HOSPITAL_COMMUNITY): Payer: Self-pay | Admitting: Interventional Cardiology

## 2016-01-30 ENCOUNTER — Inpatient Hospital Stay (HOSPITAL_COMMUNITY): Payer: Medicare Other

## 2016-01-30 ENCOUNTER — Encounter (HOSPITAL_COMMUNITY)
Admission: AD | Disposition: A | Discharge status: Home / Self Care | Payer: Self-pay | Source: Ambulatory Visit | Attending: Cardiology

## 2016-01-30 DIAGNOSIS — I248 Other forms of acute ischemic heart disease: Secondary | ICD-10-CM | POA: Diagnosis not present

## 2016-01-30 DIAGNOSIS — I2510 Atherosclerotic heart disease of native coronary artery without angina pectoris: Secondary | ICD-10-CM | POA: Diagnosis not present

## 2016-01-30 DIAGNOSIS — R9439 Abnormal result of other cardiovascular function study: Secondary | ICD-10-CM | POA: Diagnosis not present

## 2016-01-30 DIAGNOSIS — I130 Hypertensive heart and chronic kidney disease with heart failure and stage 1 through stage 4 chronic kidney disease, or unspecified chronic kidney disease: Secondary | ICD-10-CM | POA: Diagnosis not present

## 2016-01-30 DIAGNOSIS — N184 Chronic kidney disease, stage 4 (severe): Secondary | ICD-10-CM | POA: Diagnosis not present

## 2016-01-30 DIAGNOSIS — E1122 Type 2 diabetes mellitus with diabetic chronic kidney disease: Secondary | ICD-10-CM | POA: Diagnosis not present

## 2016-01-30 DIAGNOSIS — I959 Hypotension, unspecified: Secondary | ICD-10-CM | POA: Diagnosis not present

## 2016-01-30 DIAGNOSIS — I5022 Chronic systolic (congestive) heart failure: Secondary | ICD-10-CM | POA: Diagnosis not present

## 2016-01-30 HISTORY — PX: CARDIAC CATHETERIZATION: SHX172

## 2016-01-30 LAB — BASIC METABOLIC PANEL
Anion gap: 12 (ref 5–15)
BUN: 42 mg/dL — AB (ref 6–20)
CHLORIDE: 105 mmol/L (ref 101–111)
CO2: 23 mmol/L (ref 22–32)
CREATININE: 2.27 mg/dL — AB (ref 0.61–1.24)
Calcium: 9.3 mg/dL (ref 8.9–10.3)
GFR calc Af Amer: 32 mL/min — ABNORMAL LOW (ref >60)
GFR calc non Af Amer: 28 mL/min — ABNORMAL LOW (ref >60)
GLUCOSE: 165 mg/dL — AB (ref 65–99)
POTASSIUM: 4 mmol/L (ref 3.5–5.1)
SODIUM: 140 mmol/L (ref 135–145)

## 2016-01-30 LAB — TROPONIN I
TROPONIN I: 0.7 ng/mL — AB (ref <0.03)
Troponin I: 0.46 ng/mL (ref <0.03)

## 2016-01-30 LAB — CBC
HEMATOCRIT: 36 % — AB (ref 39.0–52.0)
HEMOGLOBIN: 11.7 g/dL — AB (ref 13.0–17.0)
MCH: 27.2 pg (ref 26.0–34.0)
MCHC: 32.5 g/dL (ref 30.0–36.0)
MCV: 83.7 fL (ref 78.0–100.0)
Platelets: 350 10*3/uL (ref 150–400)
RBC: 4.3 MIL/uL (ref 4.22–5.81)
RDW: 14.5 % (ref 11.5–15.5)
WBC: 16.8 10*3/uL — ABNORMAL HIGH (ref 4.0–10.5)

## 2016-01-30 LAB — HEPARIN LEVEL (UNFRACTIONATED)
HEPARIN UNFRACTIONATED: 0.15 [IU]/mL — AB (ref 0.30–0.70)
HEPARIN UNFRACTIONATED: 0.31 [IU]/mL (ref 0.30–0.70)

## 2016-01-30 LAB — ECHOCARDIOGRAM COMPLETE
Height: 67 in
WEIGHTICAEL: 2256 [oz_av]

## 2016-01-30 LAB — GLUCOSE, CAPILLARY
GLUCOSE-CAPILLARY: 158 mg/dL — AB (ref 65–99)
GLUCOSE-CAPILLARY: 141 mg/dL — AB (ref 65–99)
Glucose-Capillary: 228 mg/dL — ABNORMAL HIGH (ref 65–99)
Glucose-Capillary: 124 mg/dL — ABNORMAL HIGH (ref 65–99)

## 2016-01-30 SURGERY — LEFT HEART CATH AND CORONARY ANGIOGRAPHY
Anesthesia: LOCAL

## 2016-01-30 MED ORDER — ONDANSETRON HCL 4 MG/2ML IJ SOLN
4.0000 mg | Freq: Four times a day (QID) | INTRAMUSCULAR | Status: DC | PRN
Start: 2016-01-30 — End: 2016-01-30

## 2016-01-30 MED ORDER — VERAPAMIL HCL 2.5 MG/ML IV SOLN
INTRAVENOUS | Status: DC | PRN
  Administered 2016-01-30: 10 mL via INTRA_ARTERIAL

## 2016-01-30 MED ORDER — SODIUM CHLORIDE 0.9% FLUSH: 3.0000 mL | INTRAVENOUS | Status: DC | PRN

## 2016-01-30 MED ORDER — FENTANYL CITRATE (PF) 100 MCG/2ML IJ SOLN
INTRAMUSCULAR | Status: AC
  Filled 2016-01-30: qty 2

## 2016-01-30 MED ORDER — IOPAMIDOL (ISOVUE-370) INJECTION 76%
INTRAVENOUS | Status: DC | PRN
  Administered 2016-01-30: 35 mL via INTRA_ARTERIAL

## 2016-01-30 MED ORDER — HEPARIN (PORCINE) IN NACL 2-0.9 UNIT/ML-% IJ SOLN
INTRAMUSCULAR | Status: DC | PRN
  Administered 2016-01-30: 1500 mL

## 2016-01-30 MED ORDER — MIDAZOLAM HCL 2 MG/2ML IJ SOLN
INTRAMUSCULAR | Status: DC | PRN
Start: 2016-01-30 — End: 2016-01-30
  Administered 2016-01-30: 2 mg via INTRAVENOUS

## 2016-01-30 MED ORDER — MIDAZOLAM HCL 2 MG/2ML IJ SOLN
INTRAMUSCULAR | Status: AC
  Filled 2016-01-30: qty 2

## 2016-01-30 MED ORDER — CLOPIDOGREL BISULFATE 75 MG PO TABS
75.0000 mg | ORAL_TABLET | Freq: Every day | ORAL | Status: DC
Start: 2016-01-30 — End: 2016-01-31
  Administered 2016-01-30 – 2016-01-31 (×2): 75 mg via ORAL
  Filled 2016-01-30 (×2): qty 1

## 2016-01-30 MED ORDER — LIDOCAINE HCL (PF) 1 % IJ SOLN
INTRAMUSCULAR | Status: AC
  Filled 2016-01-30: qty 30

## 2016-01-30 MED ORDER — SODIUM CHLORIDE 0.9 % IV SOLN: 250.0000 mL | INTRAVENOUS | Status: DC | PRN

## 2016-01-30 MED ORDER — HEPARIN (PORCINE) IN NACL 2-0.9 UNIT/ML-% IJ SOLN
INTRAMUSCULAR | Status: AC
  Filled 2016-01-30: qty 1500

## 2016-01-30 MED ORDER — HEPARIN SODIUM (PORCINE) 1000 UNIT/ML IJ SOLN
INTRAMUSCULAR | Status: DC | PRN
  Administered 2016-01-30: 3500 [IU] via INTRAVENOUS

## 2016-01-30 MED ORDER — HEPARIN SODIUM (PORCINE) 1000 UNIT/ML IJ SOLN
INTRAMUSCULAR | Status: AC
  Filled 2016-01-30: qty 1

## 2016-01-30 MED ORDER — METOPROLOL SUCCINATE ER 50 MG PO TB24
50.0000 mg | ORAL_TABLET | Freq: Every day | ORAL | Status: DC
  Administered 2016-01-30 – 2016-01-31 (×2): 50 mg via ORAL
  Filled 2016-01-30 (×2): qty 1

## 2016-01-30 MED ORDER — FENTANYL CITRATE (PF) 100 MCG/2ML IJ SOLN
INTRAMUSCULAR | Status: DC | PRN
  Administered 2016-01-30: 25 ug via INTRAVENOUS

## 2016-01-30 MED ORDER — VERAPAMIL HCL 2.5 MG/ML IV SOLN
INTRAVENOUS | Status: AC
Start: 2016-01-30 — End: 2016-01-30
  Filled 2016-01-30: qty 2

## 2016-01-30 MED ORDER — ACETAMINOPHEN 325 MG PO TABS: 650.0000 mg | ORAL_TABLET | ORAL | Status: DC | PRN

## 2016-01-30 MED ORDER — IOPAMIDOL (ISOVUE-370) INJECTION 76%
INTRAVENOUS | Status: AC
  Filled 2016-01-30: qty 100

## 2016-01-30 MED ORDER — LIDOCAINE HCL (PF) 1 % IJ SOLN
INTRAMUSCULAR | Status: DC | PRN
  Administered 2016-01-30: 6 mL via INTRADERMAL

## 2016-01-30 MED ORDER — SODIUM CHLORIDE 0.9% FLUSH
3.0000 mL | Freq: Two times a day (BID) | INTRAVENOUS | Status: DC
Start: 2016-01-30 — End: 2016-01-31
  Administered 2016-01-31: 3 mL via INTRAVENOUS

## 2016-01-30 MED ORDER — SODIUM CHLORIDE 0.9 % WEIGHT BASED INFUSION: 3.0000 mL/kg/h | INTRAVENOUS | Status: AC

## 2016-01-30 SURGICAL SUPPLY — 12 items
CATH INFINITI 5 FR JL3.5 (CATHETERS) ×2 IMPLANT
CATH INFINITI JR4 5F (CATHETERS) ×2 IMPLANT
COVER PRB 48X5XTLSCP FOLD TPE (BAG) ×1 IMPLANT
COVER PROBE 5X48 (BAG) ×1
DEVICE RAD COMP TR BAND LRG (VASCULAR PRODUCTS) ×2 IMPLANT
GLIDESHEATH SLEND SS 6F .021 (SHEATH) ×2 IMPLANT
KIT HEART LEFT (KITS) ×2 IMPLANT
PACK CARDIAC CATHETERIZATION (CUSTOM PROCEDURE TRAY) ×2 IMPLANT
TRANSDUCER W/STOPCOCK (MISCELLANEOUS) ×2 IMPLANT
TUBING CIL FLEX 10 FLL-RA (TUBING) ×2 IMPLANT
WIRE HI TORQ VERSACORE-J 145CM (WIRE) ×2 IMPLANT
WIRE SAFE-T 1.5MM-J .035X260CM (WIRE) ×2 IMPLANT

## 2016-01-30 NOTE — Interval H&P Note (Signed)
Cath Lab Visit (complete for each Cath Lab visit)  Clinical Evaluation Leading to the Procedure:   ACS: No.  Non-ACS:    Anginal Classification: CCS IV  Anti-ischemic medical therapy: Minimal Therapy (1 class of medications)  Non-Invasive Test Results: High-risk stress test findings: cardiac mortality >3%/year  Prior CABG: No previous CABG      History and Physical Interval Note:  01/30/2016 1:32 PM  Adam Reid  has presented today for surgery, with the diagnosis of chest pain  The various methods of treatment have been discussed with the patient and family. After consideration of risks, benefits and other options for treatment, the patient has consented to  Procedure(s): Left Heart Cath and Coronary Angiography (N/A) as a surgical intervention .  The patient's history has been reviewed, patient examined, no change in status, stable for surgery.  I have reviewed the patient's chart and labs.  Questions were answered to the patient's satisfaction.     Larae Grooms

## 2016-01-30 NOTE — Progress Notes (Signed)
ANTICOAGULATION CONSULT NOTE - Follow up [place] for Heparin Indication: chest pain/ACS  No Known Allergies  Patient Measurements: Height: 5\' 7"  (170.2 cm) Weight: 141 lb (63.957 kg) IBW/kg (Calculated) : 66.1 Heparin Dosing Weight: 65  Vital Signs: Temp: 99.7 F (37.6 C) (07/21 0447) Temp Source: Oral (07/21 0447) BP: 150/79 mmHg (07/21 0447) Pulse Rate: 93 (07/21 0447)  Labs:  Recent Labs  01/29/16 1649 01/29/16 2234 01/30/16 0109 01/30/16 0451 01/30/16 1107  HGB 11.3*  --   --  11.7*  --   HCT 35.1*  --   --  36.0*  --   PLT 319  --   --  350  --   APTT 31  --   --   --   --   LABPROT 13.6  --   --   --   --   INR 1.02  --   --   --   --   HEPARINUNFRC  --   --  0.15*  --  0.31  CREATININE 2.52*  --   --  2.27*  --   TROPONINI 0.22* 0.46*  --  0.70*  --     Estimated Creatinine Clearance: 28.2 mL/min (by C-G formula based on Cr of 2.27).   Medical History: Past Medical History  Diagnosis Date  . Thyroid disease     was on supplement, taken off by [name]  . Hypertension   . Diabetes mellitus without complication ([place])   . Chronic kidney disease   . Hyperlipidemia     Medications:  Scheduled:  . aspirin EC  81 mg Oral Daily  . atenolol  50 mg Oral Daily  . atorvastatin  80 mg Oral Daily  . citalopram  20 mg Oral Daily  . glipiZIDE  10 mg Oral Q breakfast  . insulin aspart  0-9 Units Subcutaneous TID WC    Assessment: 70yo diabetic male with chronic kidney disease on heparin for abnormal stress test with plan for cath today. HL currently therapeutic at 0.31 on 900 units/hr. Will defer any adjustments for now. CBC stable - Hgb 11.7, plt 350. No bleeding reported.  Goal of Therapy:  Heparin level 0.3-0.7 units/ml Monitor platelets by anticoagulation protocol: Yes   Plan:  -Continue heparin at 900 units/hr -Daily CBC/HL -Monitor s/sx bleeding  Stephens November, PharmD Clinical Pharmacist  12:15 PM, 01/30/2016

## 2016-01-30 NOTE — H&P (View-Only) (Signed)
Subjective:  History was obtained through an interpreter.  He has had a good evening and denies any shortness of breath or chest pain.  High risk stress test yesterday.  Creatinine has improved today.  Objective:  Vital Signs in the last 24 hours: BP 150/79 mmHg  Pulse 93  Temp(Src) 99.7 F (37.6 C) (Oral)  Resp 18  Ht 5\' 7"  (1.702 m)  Wt 63.957 kg (141 lb)  BMI 22.08 kg/m2  SpO2 94%  Physical Exam: [place] appearing male in no acute distress Lungs:  Clear Cardiac:  Regular rhythm, normal S1 and S2, no S3 Extremities:  No edema present  Intake/Output from previous day: 07/20 0701 - 07/21 0700 In: 240 [P.O.:240] Out: -   Weight Filed Weights   01/29/16 1537 01/30/16 0447  Weight: 64.728 kg (142 lb 11.2 oz) 63.957 kg (141 lb)    Lab Results: Basic Metabolic Panel:  Recent Labs  01/29/16 1649 01/30/16 0451  NA 137 140  K 4.0 4.0  CL 103 105  CO2 25 23  GLUCOSE 178* 165*  BUN 47* 42*  CREATININE 2.52* 2.27*   CBC:  Recent Labs  01/29/16 1649 01/30/16 0451  WBC 9.9 16.8*  NEUTROABS 7.7  --   HGB 11.3* 11.7*  HCT 35.1* 36.0*  MCV 84.6 83.7  PLT 319 350   Cardiac Panel (last 3 results)  Recent Labs  01/29/16 1649 01/29/16 2234 01/30/16 0451  TROPONINI 0.22* 0.46* 0.70*    Telemetry: Sinus rhythm  Assessment/Plan:  1.  Recent chest pain with very high risk stress test followed by post exercise hypotension yesterday 2.  Probable demand ischemia 3.  Acute on chronic kidney disease-creatinine is better today 4.  Diabetes mellitus with chronic kidney disease  Recommendations:  The patient had a an extremely high risk stress test followed by hypotension which was delayed following exercise yesterday.  He has been admitted and has a mild elevation of his troponin.  His EKG shows very minimal T wave abnormalities.  His creatinine was elevated yesterday but is improved today.  Cardiac catheterization is planned later today.  Plan to  get echocardiogram with morning to avoid contrast load.  He has been hydrated overnight and blood pressure is higher this morning.  Procedure and risks were discussed yesterday with the patient and his wife with the wife serving as interpreter.  I asked through the interpreter this morning if he had any further questions about the procedure and he is willing to proceed.     Kerry Hough  MD [facility]  01/30/2016, 8:24 AM

## 2016-01-30 NOTE — Progress Notes (Addendum)
Subjective:  History was obtained through an interpreter.  He has had a good evening and denies any shortness of breath or chest pain.  High risk stress test yesterday.  Creatinine has improved today.  Objective:  Vital Signs in the last 24 hours: BP 150/79 mmHg  Pulse 93  Temp(Src) 99.7 F (37.6 C) (Oral)  Resp 18  Ht 5\' 7"  (1.702 m)  Wt 63.957 kg (141 lb)  BMI 22.08 kg/m2  SpO2 94%  Physical Exam: [place] appearing male in no acute distress Lungs:  Clear Cardiac:  Regular rhythm, normal S1 and S2, no S3 Extremities:  No edema present  Intake/Output from previous day: 07/20 0701 - 07/21 0700 In: 240 [P.O.:240] Out: -   Weight Filed Weights   01/29/16 1537 01/30/16 0447  Weight: 64.728 kg (142 lb 11.2 oz) 63.957 kg (141 lb)    Lab Results: Basic Metabolic Panel:  Recent Labs  01/29/16 1649 01/30/16 0451  NA 137 140  K 4.0 4.0  CL 103 105  CO2 25 23  GLUCOSE 178* 165*  BUN 47* 42*  CREATININE 2.52* 2.27*   CBC:  Recent Labs  01/29/16 1649 01/30/16 0451  WBC 9.9 16.8*  NEUTROABS 7.7  --   HGB 11.3* 11.7*  HCT 35.1* 36.0*  MCV 84.6 83.7  PLT 319 350   Cardiac Panel (last 3 results)  Recent Labs  01/29/16 1649 01/29/16 2234 01/30/16 0451  TROPONINI 0.22* 0.46* 0.70*    Telemetry: Sinus rhythm  Assessment/Plan:  1.  Recent chest pain with very high risk stress test followed by post exercise hypotension yesterday 2.  Probable demand ischemia 3.  Acute on chronic kidney disease-creatinine is better today 4.  Diabetes mellitus with chronic kidney disease  Recommendations:  The patient had a an extremely high risk stress test followed by hypotension which was delayed following exercise yesterday.  He has been admitted and has a mild elevation of his troponin.  His EKG shows very minimal T wave abnormalities.  His creatinine was elevated yesterday but is improved today.  Cardiac catheterization is planned later today.  Plan to  get echocardiogram with morning to avoid contrast load.  He has been hydrated overnight and blood pressure is higher this morning.  Procedure and risks were discussed yesterday with the patient and his wife with the wife serving as interpreter.  I asked through the interpreter this morning if he had any further questions about the procedure and he is willing to proceed.     Kerry Hough  MD [facility]  01/30/2016, 8:24 AM

## 2016-01-30 NOTE — Progress Notes (Signed)
  Echocardiogram 2D Echocardiogram has been performed.  Adam Reid 01/30/2016, 9:45 AM

## 2016-01-30 NOTE — Progress Notes (Signed)
Patient is stable.  I reviewed the catheterization films as well as echo.  The echo shows a definite wall motion abnormality in the inferior wall with an EF of around 40-45%.  There is mild-to-moderate mitral regurgitation.  Cath films reviewed also.  There appears to be delayed washout of contrast in the distal right coronary artery and there is a irregularity in the vessel that doesn't seem to be quite laid out entirely but also did not appear to be flow limiting.  However there appear to be somewhat delayed entry of contrast into the distal vessel which makes me worry about this spot.  I would wonder whether or not he either had a ruptured plaque or localized dissection that caused an issue or could've had transient spasm.  I think that he will need to be treated as an infarct unclear of the timing of it.  I suspect the troponin elevation was due to the hypotension.  I'm going to start him on Plavix and a been a change him to extended release metoprolol.  Hold ACE inhibitor for the time being and recheck renal function in the morning.  I asked the interventional cardiologist to look at the films again to see what he thought about the right coronary artery.  If he stable tomorrow he might could be discharged home to follow-up with me next week.  Kerry Hough. MD [facility] 01/30/2016 4:59 PM

## 2016-01-31 DIAGNOSIS — I248 Other forms of acute ischemic heart disease: Secondary | ICD-10-CM

## 2016-01-31 DIAGNOSIS — I9589 Other hypotension: Secondary | ICD-10-CM | POA: Diagnosis not present

## 2016-01-31 DIAGNOSIS — E1122 Type 2 diabetes mellitus with diabetic chronic kidney disease: Secondary | ICD-10-CM | POA: Diagnosis not present

## 2016-01-31 DIAGNOSIS — N184 Chronic kidney disease, stage 4 (severe): Secondary | ICD-10-CM | POA: Diagnosis not present

## 2016-01-31 DIAGNOSIS — I25118 Atherosclerotic heart disease of native coronary artery with other forms of angina pectoris: Secondary | ICD-10-CM

## 2016-01-31 DIAGNOSIS — E785 Hyperlipidemia, unspecified: Secondary | ICD-10-CM

## 2016-01-31 DIAGNOSIS — R9439 Abnormal result of other cardiovascular function study: Secondary | ICD-10-CM | POA: Diagnosis not present

## 2016-01-31 DIAGNOSIS — I130 Hypertensive heart and chronic kidney disease with heart failure and stage 1 through stage 4 chronic kidney disease, or unspecified chronic kidney disease: Secondary | ICD-10-CM | POA: Diagnosis not present

## 2016-01-31 DIAGNOSIS — I5022 Chronic systolic (congestive) heart failure: Secondary | ICD-10-CM | POA: Diagnosis not present

## 2016-01-31 DIAGNOSIS — I959 Hypotension, unspecified: Secondary | ICD-10-CM | POA: Diagnosis not present

## 2016-01-31 LAB — BASIC METABOLIC PANEL
ANION GAP: 8 (ref 5–15)
BUN: 34 mg/dL — AB (ref 6–20)
CHLORIDE: 107 mmol/L (ref 101–111)
CO2: 25 mmol/L (ref 22–32)
Calcium: 9.3 mg/dL (ref 8.9–10.3)
Creatinine, Ser: 1.92 mg/dL — ABNORMAL HIGH (ref 0.61–1.24)
GFR calc Af Amer: 40 mL/min — ABNORMAL LOW (ref >60)
GFR, EST NON AFRICAN AMERICAN: 34 mL/min — AB (ref >60)
GLUCOSE: 118 mg/dL — AB (ref 65–99)
POTASSIUM: 4.3 mmol/L (ref 3.5–5.1)
Sodium: 140 mmol/L (ref 135–145)

## 2016-01-31 LAB — HEMOGLOBIN A1C
HEMOGLOBIN A1C: 7.3 % — AB (ref 4.8–5.6)
Mean Plasma Glucose: 163 mg/dL

## 2016-01-31 LAB — CBC
HCT: 32.3 % — ABNORMAL LOW (ref 39.0–52.0)
HEMOGLOBIN: 10.6 g/dL — AB (ref 13.0–17.0)
MCH: 27.6 pg (ref 26.0–34.0)
MCHC: 32.8 g/dL (ref 30.0–36.0)
MCV: 84.1 fL (ref 78.0–100.0)
Platelets: 258 10*3/uL (ref 150–400)
RBC: 3.84 MIL/uL — AB (ref 4.22–5.81)
RDW: 14.4 % (ref 11.5–15.5)
WBC: 10.1 10*3/uL (ref 4.0–10.5)

## 2016-01-31 LAB — GLUCOSE, CAPILLARY
Glucose-Capillary: 128 mg/dL — ABNORMAL HIGH (ref 65–99)
Glucose-Capillary: 126 mg/dL — ABNORMAL HIGH (ref 65–99)

## 2016-01-31 MED ORDER — CLOPIDOGREL BISULFATE 75 MG PO TABS: 75.0000 mg | ORAL_TABLET | Freq: Every day | ORAL | Status: DC

## 2016-01-31 MED ORDER — NITROGLYCERIN 0.4 MG SL SUBL: 0.4000 mg | SUBLINGUAL_TABLET | SUBLINGUAL | Status: DC | PRN

## 2016-01-31 MED ORDER — METOPROLOL SUCCINATE ER 50 MG PO TB24: 50.0000 mg | ORAL_TABLET | Freq: Every day | ORAL | Status: DC

## 2016-01-31 NOTE — Discharge Instructions (Signed)
Please call Dr. Thurman Coyer office to schedule appointment for next week.    Radial Site Care Refer to this sheet in the next few weeks. These instructions provide you with information about caring for yourself after your procedure. Your health care provider may also give you more specific instructions. Your treatment has been planned according to current medical practices, but problems sometimes occur. Call your health care provider if you have any problems or questions after your procedure. WHAT TO EXPECT AFTER THE PROCEDURE After your procedure, it is typical to have the following:  Bruising at the radial site that usually fades within 1-2 weeks.  Blood collecting in the tissue (hematoma) that may be painful to the touch. It should usually decrease in size and tenderness within 1-2 weeks. HOME CARE INSTRUCTIONS  Take medicines only as directed by your health care provider.  You may shower 24-48 hours after the procedure or as directed by your health care provider. Remove the bandage (dressing) and gently wash the site with plain soap and water. Pat the area dry with a clean towel. Do not rub the site, because this may cause bleeding.  Do not take baths, swim, or use a hot tub until your health care provider approves.  Check your insertion site every day for redness, swelling, or drainage.  Do not apply powder or lotion to the site.  Do not flex or bend the affected arm for 24 hours or as directed by your health care provider.  Do not push or pull heavy objects with the affected arm for 24 hours or as directed by your health care provider.  Do not lift over 10 lb (4.5 kg) for 5 days after your procedure or as directed by your health care provider.  Ask your health care provider when it is okay to:  Return to work or school.  Resume usual physical activities or sports.  Resume sexual activity.  Do not drive home if you are discharged the same day as the procedure. Have someone else  drive you.  You may drive 24 hours after the procedure unless otherwise instructed by your health care provider.  Do not operate machinery or power tools for 24 hours after the procedure.  If your procedure was done as an outpatient procedure, which means that you went home the same day as your procedure, a responsible adult should be with you for the first 24 hours after you arrive home.  Keep all follow-up visits as directed by your health care provider. This is important. SEEK MEDICAL CARE IF:  You have a fever.  You have chills.  You have increased bleeding from the radial site. Hold pressure on the site. SEEK IMMEDIATE MEDICAL CARE IF:  You have unusual pain at the radial site.  You have redness, warmth, or swelling at the radial site.  You have drainage (other than a small amount of blood on the dressing) from the radial site.  The radial site is bleeding, and the bleeding does not stop after 30 minutes of holding steady pressure on the site.  Your arm or hand becomes pale, cool, tingly, or numb.   This information is not intended to replace advice given to you by your health care provider. Make sure you discuss any questions you have with your health care provider.   Document Released: 07/31/2010 Document Revised: 07/19/2014 Document Reviewed: 01/14/2014 Elsevier Interactive Patient Education [employer].

## 2016-01-31 NOTE — Progress Notes (Signed)
SUBJECTIVE: Spoke with wife who interpreted for patient. He is feeling well and denies chest pain and shortness of breath.   ROS: Other than pertinent positives in "Subjective", all others were reviewed and found to be negative.   Intake/Output Summary (Last 24 hours) at 01/31/16 0851 Last data filed at 01/31/16 0500  Gross per 24 hour  Intake    540 ml  Output    500 ml  Net     40 ml    Current Facility-Administered Medications  Medication Dose Route Frequency Provider Last Rate Last Dose  . 0.9 %  sodium chloride infusion   Intravenous Continuous Jacolyn Reedy, MD 50 mL/hr at 01/29/16 1734    . 0.9 %  sodium chloride infusion  250 mL Intravenous PRN [name], MD      . acetaminophen (TYLENOL) tablet 650 mg  650 mg Oral Q4H PRN [name], MD      . aspirin EC tablet 81 mg  81 mg Oral Daily Jacolyn Reedy, MD   0 mg at 01/30/16 1000  . atorvastatin (LIPITOR) tablet 80 mg  80 mg Oral Daily Jacolyn Reedy, MD   80 mg at 01/30/16 0831  . citalopram (CELEXA) tablet 20 mg  20 mg Oral Daily Jacolyn Reedy, MD   20 mg at 01/30/16 0831  . clopidogrel (PLAVIX) tablet 75 mg  75 mg Oral Daily Jacolyn Reedy, MD   75 mg at 01/30/16 1733  . glipiZIDE (GLUCOTROL XL) 24 hr tablet 10 mg  10 mg Oral Q breakfast Jacolyn Reedy, MD   10 mg at 01/31/16 [phone]  . insulin aspart (novoLOG) injection 0-9 Units  0-9 Units Subcutaneous TID WC Jacolyn Reedy, MD   1 Units at 01/31/16 [phone]  . metoprolol succinate (TOPROL-XL) 24 hr tablet 50 mg  50 mg Oral Daily Jacolyn Reedy, MD   50 mg at 01/30/16 1733  . nitroGLYCERIN (NITROSTAT) SL tablet 0.4 mg  0.4 mg Sublingual Q5 Min x 3 PRN Jacolyn Reedy, MD      . ondansetron [facility]) injection 4 mg  4 mg Intravenous Q6H PRN Jacolyn Reedy, MD      . sodium chloride flush (NS) 0.9 % injection 3 mL  3 mL Intravenous Q12H [name], MD   3 mL at 01/30/16 1718  . sodium chloride flush (NS) 0.9 % injection 3 mL   3 mL Intravenous PRN [name], MD        Filed Vitals:   01/30/16 1706 01/30/16 1806 01/30/16 2026 01/31/16 0500  BP: 141/95 154/91 115/71 126/71  Pulse: 76 66 63 80  Temp:   98.4 F (36.9 C) 98 F (36.7 C)  TempSrc:      Resp: 18 16 16 17   Height:      Weight:    140 lb 12.8 oz (63.866 kg)  SpO2: 99% 98% 100% 99%    PHYSICAL EXAM General: NAD HEENT: Normal. Neck: No JVD, no thyromegaly.  Lungs: Clear to auscultation bilaterally with normal respiratory effort. CV: Nondisplaced PMI.  Regular rate and rhythm, normal S1/S2, no S3/S4, no murmur.  No pretibial edema.    Abdomen: Soft, nontender, no distention.  Neurologic: Alert.  Psych: Normal affect. Musculoskeletal: Normal range of motion. No gross deformities. Extremities: No clubbing or cyanosis.     LABS: Basic Metabolic Panel:  Recent Labs  01/30/16 0451 01/31/16 0358  NA 140 140  K 4.0 4.3  CL 105 107  CO2 23 25  GLUCOSE 165* 118*  BUN 42* 34*  CREATININE 2.27* 1.92*  CALCIUM 9.3 9.3   Liver Function Tests:  Recent Labs  01/29/16 1649  AST 21  ALT 22  ALKPHOS 90  BILITOT 0.5  PROT 7.0  ALBUMIN 4.2   No results for input(s): LIPASE, AMYLASE in the last 72 hours. CBC:  Recent Labs  01/29/16 1649 01/30/16 0451 01/31/16 0358  WBC 9.9 16.8* 10.1  NEUTROABS 7.7  --   --   HGB 11.3* 11.7* 10.6*  HCT 35.1* 36.0* 32.3*  MCV 84.6 83.7 84.1  PLT 319 350 258   Cardiac Enzymes:  Recent Labs  01/29/16 1649 01/29/16 2234 01/30/16 0451  TROPONINI 0.22* 0.46* 0.70*   BNP: Invalid input(s): POCBNP D-Dimer: No results for input(s): DDIMER in the last 72 hours. Hemoglobin A1C:  Recent Labs  01/29/16 2234  HGBA1C 7.3*   Fasting Lipid Panel:  Recent Labs  01/29/16 1649  CHOL 135  HDL 49  LDLCALC 36  TRIG 251*  CHOLHDL 2.8   Thyroid Function Tests:  Recent Labs  01/29/16 1649  TSH 2.490   Anemia Panel: No results for input(s): VITAMINB12, FOLATE, FERRITIN, TIBC,  IRON, RETICCTPCT in the last 72 hours.  RADIOLOGY: X-ray Chest Pa And Lateral  01/29/2016  CLINICAL DATA:  Coronary artery disease. EXAM: CHEST  2 VIEW COMPARISON:  12/02/2015 FINDINGS: Cardiomediastinal silhouette is normal. Mediastinal contours appear intact. Aorta is torturous. There is no evidence of focal airspace consolidation, pleural effusion or pneumothorax. Osseous structures are without acute abnormality. Soft tissues are grossly normal. IMPRESSION: No active cardiopulmonary disease. Electronically Signed   By: Fidela Salisbury M.D.   On: 01/29/2016 20:18      ASSESSMENT AND PLAN: 1. CAD with high risk stress test: Dr. Wynonia Lawman reviewed films and started Plavix and Toprol-XL. Pt is asymptomatic. Will hold ACEI/ARB given CKD albeit creatinine gradually improving. Continue ASA and statin.  2. Chronic systolic HF: Euvolemic. No diuretics required. EF 40-45%. Continue Toprol-XL. Will hold ACEI/ARB given CKD albeit creatinine gradually improving.  3. Demand ischemia due to hypotension: Continue ASA and Plavix.  4. CKD stage IV: Creatinine gradually declining.  Dispo: DC to home. FU with Dr. Wynonia Lawman next week.   Kate Sable, M.D., F.A.C.C.

## 2016-01-31 NOTE — Discharge Summary (Signed)
Discharge Summary    Patient ID: Adam Reid,  MRN: [id number], DOB/AGE: 69-May-1947 69 y.o.  Admit date: 01/29/2016 Discharge date: 01/31/2016  Primary Care Provider: Harrison Mons Primary Cardiologist: Dr. Wynonia Lawman  Discharge Diagnoses    Active Problems:   Hypotension   Abnormal myocardial perfusion study   Allergies No Known Allergies  Diagnostic Studies/Procedures  Left Heart Cath and Coronary Angiography 01/30/16  Ramus lesion, 20% stenosed.  Ost 2nd Diag to 2nd Diag lesion, 50% stenosed.  Mid LAD lesion, 20% stenosed.  Normal LVEDP.  Continue preventive therapy. Aggressive hydration post procedure due to renal insufficiency. Discussed with Dr. Wynonia Lawman.  Transthoracic Echocardiography 01/30/16 Study Conclusions  - Left ventricle: The cavity size was normal. There was moderate  concentric hypertrophy. Systolic function was mildly to  moderately reduced. The estimated ejection fraction was in the  range of 40% to 45%. Severe hypokinesis of the basal-midinferior  myocardium. Hypokinesis of the lateral myocardium. Doppler  parameters are consistent with abnormal left ventricular  relaxation (grade 1 diastolic dysfunction). - Mitral valve: There was moderate regurgitation. - Right atrium: The atrium was mildly dilated. - Pulmonary arteries: Systolic pressure was mildly increased. PA  peak pressure: 32 mm Hg (S).  _____________   History of Present Illness   This 70 year old male is admitted to the hospital following hypertension and an abnormal stress test. The patient has a history of diabetes mellitus with a history of chronic kidney disease and diabetes. He also has known hypertension and hyperlipidemia.  In May 2017,he was admitted to the hospital with chest discomfort that awakened him from sleep and had negative enzymes. He was seen by cardiology during that admission but did not wish to stay for stress testing and was discharged home. He was  later given appointments in my office several weeks later. Stress testing was advised. In the intervening time he had not had any recurrent chest discomfort and was feeling fairly well.  On 01/29/16, he underwent myocardial perfusion scanning. He walked 8 minutes on the treadmill and had some nausea and shortness of breath and vague chest tightness with exercise with 2 mm ST depression laterally and some PAC's and brief atrial runs with exercise.. When he got to the nuclear camera for the second set of pictures he was quite lethargic weak and was found to be hypotensive. A repeat EKG was normal and he was given 750 cc of normal saline and his blood pressure came up, but was never much above 110. The myocardial perfusion scan showed transient ischemic dilation with a large partially reversible defect in the inferolateral wall and an ejection fraction of 37%. Because of his hypotension, the language barrier and strongly positive nuclear stress test, hospitalization was advised as this is a high-risk situation with some hypotension.  Hospital Course  He was admitted for further observation with plans for cardiac catheterization the following day.  Troponin resulted positive at 0.2 to and peaked at 0.70. He underwent left heart catheterization on 01/30/2016. Report above. He had no obstructive CAD. Plan is to continue preventative medical therapy.  He was aggressively hydrated post heart catheterization as he has baseline renal insufficiency. His creatinine at the time of discharge is 1.92, GFR is 34 which was improved from his initial creatinine at admission. His right radial site is stable with no hematoma.  His echo shows reduced EF of 40-45% with definite wall motion abnormality in the inferior wall. His cath films were reviewed by Dr. Wynonia Lawman, and etiology of chest  discomfort and hypotension at the time of nuclear study could be due to ruptured plaque or localized dissection. He will be started on  Plavix.  We will hold his ace inhibitor and have him follow-up with Dr. Wynonia Lawman next week. He will continue aspirin and Plavix. He was seen today by Dr.Koneswaran and deemed suitable for discharge. _____________  Discharge Vitals Blood pressure 157/102, pulse 76, temperature 98 F (36.7 C), temperature source Oral, resp. rate 10, height '5\' 7"'$  (1.702 m), weight 140 lb 12.8 oz (63.866 kg), SpO2 99 %.  Filed Weights   01/29/16 1537 01/30/16 0447 01/31/16 0500  Weight: 142 lb 11.2 oz (64.728 kg) 141 lb (63.957 kg) 140 lb 12.8 oz (63.866 kg)    Labs & Radiologic Studies     CBC  Recent Labs  01/29/16 1649 01/30/16 0451 01/31/16 0358  WBC 9.9 16.8* 10.1  NEUTROABS 7.7  --   --   HGB 11.3* 11.7* 10.6*  HCT 35.1* 36.0* 32.3*  MCV 84.6 83.7 84.1  PLT 319 350 951   Basic Metabolic Panel  Recent Labs  01/30/16 0451 01/31/16 0358  NA 140 140  K 4.0 4.3  CL 105 107  CO2 23 25  GLUCOSE 165* 118*  BUN 42* 34*  CREATININE 2.27* 1.92*  CALCIUM 9.3 9.3   Liver Function Tests  Recent Labs  01/29/16 1649  AST 21  ALT 22  ALKPHOS 90  BILITOT 0.5  PROT 7.0  ALBUMIN 4.2   Cardiac Enzymes  Recent Labs  01/29/16 1649 01/29/16 2234 01/30/16 0451  TROPONINI 0.22* 0.46* 0.70*   Hemoglobin A1C  Recent Labs  01/29/16 2234  HGBA1C 7.3*   Fasting Lipid Panel  Recent Labs  01/29/16 1649  CHOL 135  HDL 49  LDLCALC 36  TRIG 251*  CHOLHDL 2.8   Thyroid Function Tests  Recent Labs  01/29/16 1649  TSH 2.490    X-ray Chest Pa And Lateral  01/29/2016  CLINICAL DATA:  Coronary artery disease. EXAM: CHEST  2 VIEW COMPARISON:  12/02/2015 FINDINGS: Cardiomediastinal silhouette is normal. Mediastinal contours appear intact. Aorta is torturous. There is no evidence of focal airspace consolidation, pleural effusion or pneumothorax. Osseous structures are without acute abnormality. Soft tissues are grossly normal. IMPRESSION: No active cardiopulmonary disease.  Electronically Signed   By: Fidela Salisbury M.D.   On: 01/29/2016 20:18    Disposition   Pt is being discharged home today in good condition.  Follow-up Plans & Appointments   Please call Dr. Thurman Coyer office to make appointment next week Follow-up Information    Follow up with W Tollie Eth, MD.   Specialty:  Cardiology   Why:  Please call office to schedule appt. in the next week.    Contact information:   [place] [address] [phone]      Discharge Instructions    Diet - low sodium heart healthy    Complete by:  As directed      Discharge instructions    Complete by:  As directed   Radial Site Care Refer to this sheet in the next few weeks. These instructions provide you with information on caring for yourself after your procedure. Your caregiver may also give you more specific instructions. Your treatment has been planned according to current medical practices, but problems sometimes occur. Call your caregiver if you have any problems or questions after your procedure. HOME CARE INSTRUCTIONS You may shower the day after the procedure.Remove the bandage (dressing)  and gently wash the site with plain soap and water.Gently pat the site dry.  Do not apply powder or lotion to the site.  Do not submerge the affected site in water for 3 to 5 days.  Inspect the site at least twice daily.  Do not flex or bend the affected arm for 24 hours.  No lifting over 5 pounds (2.3 kg) for 5 days after your procedure.  Do not drive home if you are discharged the same day of the procedure. Have someone else drive you.  You may drive 24 hours after the procedure unless otherwise instructed by your caregiver.  What to expect: Any bruising will usually fade within 1 to 2 weeks.  Blood that collects in the tissue (hematoma) may be painful to the touch. It should usually decrease in size and tenderness within 1 to 2 weeks.  SEEK IMMEDIATE MEDICAL CARE  IF: You have unusual pain at the radial site.  You have redness, warmth, swelling, or pain at the radial site.  You have drainage (other than a small amount of blood on the dressing).  You have chills.  You have a fever or persistent symptoms for more than 72 hours.  You have a fever and your symptoms suddenly get worse.  Your arm becomes pale, cool, tingly, or numb.  You have heavy bleeding from the site. Hold pressure on the site.     Increase activity slowly    Complete by:  As directed            Discharge Medications   Current Discharge Medication List    START taking these medications   Details  clopidogrel (PLAVIX) 75 MG tablet Take 1 tablet (75 mg total) by mouth daily. Qty: 30 tablet, Refills: 12    metoprolol succinate (TOPROL-XL) 50 MG 24 hr tablet Take 1 tablet (50 mg total) by mouth daily. Take with or immediately following a meal. Qty: 30 tablet, Refills: 12    nitroGLYCERIN (NITROSTAT) 0.4 MG SL tablet Place 1 tablet (0.4 mg total) under the tongue every 5 (five) minutes x 3 doses as needed for chest pain. Qty: 25 tablet, Refills: 2      CONTINUE these medications which have NOT CHANGED   Details  amLODipine (NORVASC) 5 MG tablet Take 1 tablet (5 mg total) by mouth daily. Qty: 90 tablet, Refills: 3   Associated Diagnoses: Chronic kidney disease, unspecified stage; Essential hypertension    aspirin EC 81 MG EC tablet Take 1 tablet (81 mg total) by mouth daily. Qty: 30 tablet, Refills: 5    atorvastatin (LIPITOR) 80 MG tablet Take 1 tablet (80 mg total) by mouth daily. Qty: 30 tablet, Refills: 5    Cholecalciferol (VITAMIN D-3 PO) Take 1 tablet by mouth See admin instructions. Three times a week. No specific days    citalopram (CELEXA) 20 MG tablet Take 1 tablet (20 mg total) by mouth daily. Qty: 90 tablet, Refills: 3   Associated Diagnoses: Depression    cyanocobalamin 500 MCG tablet Take 500 mcg by mouth daily. Reported on 12/02/2015    dapagliflozin  propanediol (FARXIGA) 5 MG TABS tablet Take 5 mg by mouth daily. Qty: 90 tablet, Refills: 3   Associated Diagnoses: Type 2 diabetes mellitus with stage 3 chronic kidney disease, unspecified long term insulin use status (HCC)    FISH OIL-KRILL OIL PO Take 1 tablet by mouth daily.     glipiZIDE (GLUCOTROL XL) 10 MG 24 hr tablet Take 1 tablet (10 mg total)  by mouth daily with breakfast. Qty: 90 tablet, Refills: 3   Associated Diagnoses: DM type 2, controlled, with complication (HCC)    Magnesium 250 MG TABS Take 1 tablet by mouth daily.    Blood Glucose Monitoring Suppl (ONETOUCH VERIO) w/Device KIT USE UTD Refills: 0    GlucoCom Lancets MISC Use for home glucose monitoring Qty: 100 each, Refills: 3   Associated Diagnoses: DM type 2, controlled, with complication (HCC)    glucose blood test strip Use to test blood sugar once daily. Dx: E11.9 Qty: 100 each, Refills: 3   Associated Diagnoses: DM type 2, controlled, with complication ([place])      STOP taking these medications     atenolol (TENORMIN) 50 MG tablet      lisinopril (PRINIVIL,ZESTRIL) 5 MG tablet          Aspirin prescribed at discharge? Yes High Intensity Statin Prescribed? Yes Beta Blocker Prescribed? Yes For EF 40% or less, Was ACEI/ARB Prescribed? No, has CKD. ADP Receptor Inhibitor Prescribed? Yes For EF <40%, Aldosterone Inhibitor Prescribed? No, EF is 40-45% Was EF assessed during THIS hospitalization? Yes,  Was Cardiac Rehab II ordered? No    Outstanding Labs/Studies  BMP  Duration of Discharge Encounter   Greater than 30 minutes including physician time.  Signed, Arbutus Leas NP 01/31/2016, 11:08 AM

## 2016-01-31 NOTE — Progress Notes (Signed)
Patient given discharge instructions and provided discharge education.  Family member of patient present and assisting patient with translation.  Patient expresses understanding and released with family to go home.  Bobette Mo

## 2016-02-05 DIAGNOSIS — E1121 Type 2 diabetes mellitus with diabetic nephropathy: Secondary | ICD-10-CM | POA: Diagnosis not present

## 2016-02-05 DIAGNOSIS — R42 Dizziness and giddiness: Secondary | ICD-10-CM | POA: Diagnosis not present

## 2016-02-05 DIAGNOSIS — I2510 Atherosclerotic heart disease of native coronary artery without angina pectoris: Secondary | ICD-10-CM | POA: Diagnosis not present

## 2016-02-05 DIAGNOSIS — R0789 Other chest pain: Secondary | ICD-10-CM | POA: Diagnosis not present

## 2016-02-05 DIAGNOSIS — E785 Hyperlipidemia, unspecified: Secondary | ICD-10-CM | POA: Diagnosis not present

## 2016-02-05 DIAGNOSIS — I119 Hypertensive heart disease without heart failure: Secondary | ICD-10-CM | POA: Diagnosis not present

## 2016-02-05 DIAGNOSIS — N183 Chronic kidney disease, stage 3 (moderate): Secondary | ICD-10-CM | POA: Diagnosis not present

## 2016-02-12 DIAGNOSIS — N183 Chronic kidney disease, stage 3 (moderate): Secondary | ICD-10-CM | POA: Diagnosis not present

## 2016-02-13 ENCOUNTER — Other Ambulatory Visit: Payer: Self-pay

## 2016-02-13 MED ORDER — GLUCOSE BLOOD VI STRP: ORAL_STRIP | 2 refills | Status: DC

## 2016-02-27 DIAGNOSIS — N183 Chronic kidney disease, stage 3 (moderate): Secondary | ICD-10-CM | POA: Diagnosis not present

## 2016-03-11 DIAGNOSIS — N183 Chronic kidney disease, stage 3 (moderate): Secondary | ICD-10-CM | POA: Diagnosis not present

## 2016-03-26 ENCOUNTER — Ambulatory Visit (INDEPENDENT_AMBULATORY_CARE_PROVIDER_SITE_OTHER): Payer: Medicare Other | Admitting: Physician Assistant

## 2016-03-26 VITALS — BP 104/66 | HR 78 | Temp 98.7°F | Resp 18 | Ht 67.0 in | Wt 142.0 lb

## 2016-03-26 DIAGNOSIS — E785 Hyperlipidemia, unspecified: Secondary | ICD-10-CM

## 2016-03-26 DIAGNOSIS — I10 Essential (primary) hypertension: Secondary | ICD-10-CM

## 2016-03-26 DIAGNOSIS — N400 Enlarged prostate without lower urinary tract symptoms: Secondary | ICD-10-CM

## 2016-03-26 DIAGNOSIS — N183 Chronic kidney disease, stage 3 unspecified: Secondary | ICD-10-CM

## 2016-03-26 DIAGNOSIS — Z720 Tobacco use: Secondary | ICD-10-CM | POA: Diagnosis not present

## 2016-03-26 DIAGNOSIS — Z23 Encounter for immunization: Secondary | ICD-10-CM

## 2016-03-26 DIAGNOSIS — E1122 Type 2 diabetes mellitus with diabetic chronic kidney disease: Secondary | ICD-10-CM | POA: Diagnosis not present

## 2016-03-26 DIAGNOSIS — I2510 Atherosclerotic heart disease of native coronary artery without angina pectoris: Secondary | ICD-10-CM

## 2016-03-26 DIAGNOSIS — F17200 Nicotine dependence, unspecified, uncomplicated: Secondary | ICD-10-CM | POA: Insufficient documentation

## 2016-03-26 MED ORDER — GLUCOSE BLOOD VI STRP: ORAL_STRIP | 3 refills | Status: DC

## 2016-03-26 NOTE — Patient Instructions (Signed)
     IF you received an x-ray today, you will receive an invoice from [facility]. Please contact [facility] at [phone] with questions or concerns regarding your invoice.   IF you received labwork today, you will receive an invoice from [facility]. Please contact [facility] at [phone] with questions or concerns regarding your invoice.   Our billing staff will not be able to assist you with questions regarding bills from these companies.  You will be contacted with the lab results as soon as they are available. The fastest way to get your results is to activate your My Chart account. Instructions are located on the last page of this paperwork. If you have not heard from us regarding the results in 2 weeks, please contact this office.      

## 2016-03-26 NOTE — Progress Notes (Signed)
Patient ID: Adam Reid, male    DOB: 08-26-1945, 70 y.o.   MRN: [id number]  PCP: Harrison Mons, PA-C  Subjective:   Chief Complaint  Patient presents with  . Follow-up    diabetes    HPI Presents for evaluation of diabetes. He is accompanied by his wife, who translates, per usual.  Tolerating his medications without adverse effects. Reports that he feels well, without CP, SOB, HA, dizziness, nausea, vomiting, diarrhea, urinary urgency or frequency.  Cut back to 3 cig/day Going to the gym most days Weight there 150-151, so he is concerned that his weight here is about 10 lbs less.  Will be traveling to visit his home in [place] for several months and would like to make sure it's safe for him to travel, healthwise.  Review of Systems Denies chest pain, shortness of breath, HA, dizziness, vision change, nausea, vomiting, diarrhea, constipation, melena, hematochezia, dysuria, increased urinary urgency or frequency, increased hunger or thirst, unintentional weight change, unexplained myalgias or arthralgias, rash.      Patient Active Problem List   Diagnosis Date Noted  . Hypotension 01/29/2016  . Abnormal myocardial perfusion study 01/29/2016  . Depression 12/09/2015  . Hyperlipidemia LDL goal <100 06/28/2014  . Type 2 diabetes mellitus with renal manifestations, controlled ([place])   . HTN (hypertension) 06/24/2014  . Chronic kidney disease (CKD), stage III (moderate) 06/24/2014  . BPH (benign prostatic hyperplasia) 06/24/2014     Prior to Admission medications   Medication Sig Start Date End Date Taking? Authorizing Provider  amLODipine (NORVASC) 5 MG tablet Take 1 tablet (5 mg total) by mouth daily. 09/02/15  Yes Drury Ardizzone, PA-C  aspirin EC 81 MG EC tablet Take 1 tablet (81 mg total) by mouth daily. 12/03/15  Yes Sela Hua, MD  atorvastatin (LIPITOR) 80 MG tablet Take 1 tablet (80 mg total) by mouth daily. 12/03/15  Yes Sela Hua, MD  Blood Glucose  Monitoring Suppl [facility] VERIO) w/Device KIT USE UTD 09/03/15  Yes Historical Provider, MD  Cholecalciferol (VITAMIN D-3 PO) Take 1 tablet by mouth See admin instructions. Three times a week. No specific days   Yes Historical Provider, MD  citalopram (CELEXA) 20 MG tablet Take 1 tablet (20 mg total) by mouth daily. 12/09/15  Yes Laylani Pudwill, PA-C  clopidogrel (PLAVIX) 75 MG tablet Take 1 tablet (75 mg total) by mouth daily. 01/31/16  Yes Arbutus Leas, NP  cyanocobalamin 500 MCG tablet Take 500 mcg by mouth daily. Reported on 12/02/2015   Yes Historical Provider, MD  dapagliflozin propanediol (FARXIGA) 5 MG TABS tablet Take 5 mg by mouth daily. 12/09/15  Yes Jorgina Binning, PA-C  FISH OIL-KRILL OIL PO Take 1 tablet by mouth daily.    Yes Historical Provider, MD  glipiZIDE (GLUCOTROL XL) 10 MG 24 hr tablet Take 1 tablet (10 mg total) by mouth daily with breakfast. 09/02/15  Yes Harrison Mons, PA-C  GlucoCom Lancets MISC Use for home glucose monitoring 09/02/15  Yes Emrey Thornley, PA-C  glucose blood test strip Test blood sugar once daily. Dx E11.22 02/13/16  Yes Harrison Mons, PA-C  Magnesium 250 MG TABS Take 1 tablet by mouth daily.   Yes Historical Provider, MD  metoprolol succinate (TOPROL-XL) 50 MG 24 hr tablet Take 1 tablet (50 mg total) by mouth daily. Take with or immediately following a meal. 01/31/16  Yes Arbutus Leas, NP  nitroGLYCERIN (NITROSTAT) 0.4 MG SL tablet Place 1 tablet (0.4 mg total) under the tongue every 5 (  five) minutes x 3 doses as needed for chest pain. 01/31/16  Yes Arbutus Leas, NP     No Known Allergies     Objective:  Physical Exam  Constitutional: He is oriented to person, place, and time. He appears well-developed and well-nourished. He is active and cooperative. No distress.  BP 104/66 (BP Location: Right Arm, Patient Position: Sitting, Cuff Size: Small)   Pulse 78   Temp 98.7 F (37.1 C) (Oral)   Resp 18   Ht '5\' 7"'$  (1.702 m)   Wt 142 lb (64.4 kg)   SpO2 100%    BMI 22.24 kg/m   HENT:  Head: Normocephalic and atraumatic.  Right Ear: Hearing normal.  Left Ear: Hearing normal.  Eyes: Conjunctivae are normal. No scleral icterus.  Neck: Normal range of motion. Neck supple. No thyromegaly present.  Cardiovascular: Normal rate, regular rhythm and normal heart sounds.   Pulses:      Radial pulses are 2+ on the right side, and 2+ on the left side.  Pulmonary/Chest: Effort normal and breath sounds normal.  Lymphadenopathy:       Head (right side): No tonsillar, no preauricular, no posterior auricular and no occipital adenopathy present.       Head (left side): No tonsillar, no preauricular, no posterior auricular and no occipital adenopathy present.    He has no cervical adenopathy.       Right: No supraclavicular adenopathy present.       Left: No supraclavicular adenopathy present.  Neurological: He is alert and oriented to person, place, and time. No sensory deficit.  Skin: Skin is warm, dry and intact. No rash noted. No cyanosis or erythema. Nails show no clubbing.  Psychiatric: He has a normal mood and affect. His speech is normal and behavior is normal.           Assessment & Plan:   1. Controlled type 2 diabetes mellitus with stage 3 chronic kidney disease, unspecified long term insulin use status ([place]) Await labs. Adjust regimen as indicated by results. - glucose blood test strip; Test blood sugar once daily. Dx E11.22  Dispense: 100 each; Refill: 3 - Comprehensive metabolic panel - Hemoglobin A1c  2. Essential hypertension Controlled. Continue current treatment. - Comprehensive metabolic panel - Thyroid Panel With TSH  3. Chronic kidney disease (CKD), stage III (moderate) Await labs. - Comprehensive metabolic panel  4. Hyperlipidemia LDL goal <100 Await lab results. - Comprehensive metabolic panel - Lipid panel  5. Smoker Encouraged continued reduction in smoking.  6. Need for influenza vaccination - Flu Vaccine QUAD  36+ mos IM  7. BPH (benign prostatic hyperplasia) Await lab results. - PSA    Return in about 6 months (around 09/23/2016).    Fara Chute, PA-C Physician Assistant-Certified [facility]

## 2016-03-27 LAB — COMPREHENSIVE METABOLIC PANEL
ALBUMIN: 4.9 g/dL (ref 3.6–5.1)
ALK PHOS: 90 U/L (ref 40–115)
ALT: 22 U/L (ref 9–46)
AST: 18 U/L (ref 10–35)
BILIRUBIN TOTAL: 0.4 mg/dL (ref 0.2–1.2)
BUN: 51 mg/dL — ABNORMAL HIGH (ref 7–25)
CALCIUM: 9.5 mg/dL (ref 8.6–10.3)
CO2: 26 mmol/L (ref 20–31)
CREATININE: 2.68 mg/dL — AB (ref 0.70–1.25)
Chloride: 102 mmol/L (ref 98–110)
Glucose, Bld: 45 mg/dL — ABNORMAL LOW (ref 65–99)
Potassium: 3.9 mmol/L (ref 3.5–5.3)
SODIUM: 139 mmol/L (ref 135–146)
TOTAL PROTEIN: 7.8 g/dL (ref 6.1–8.1)

## 2016-03-27 LAB — THYROID PANEL WITH TSH
Free Thyroxine Index: 1.9 (ref 1.4–3.8)
T3 Uptake: 30 % (ref 22–35)
T4, Total: 6.3 ug/dL (ref 4.5–12.0)
TSH: 2.38 m[IU]/L (ref 0.40–4.50)

## 2016-03-27 LAB — LIPID PANEL
CHOLESTEROL: 154 mg/dL (ref 125–200)
HDL: 73 mg/dL (ref >=40)
LDL CALC: 53 mg/dL (ref <130)
TRIGLYCERIDES: 141 mg/dL (ref <150)
Total CHOL/HDL Ratio: 2.1 Ratio (ref <=5.0)
VLDL: 28 mg/dL (ref <30)

## 2016-03-27 LAB — PSA: PSA: 2.7 ng/mL (ref <=4.0)

## 2016-03-28 LAB — HEMOGLOBIN A1C
Hgb A1c MFr Bld: 6.6 % — ABNORMAL HIGH (ref <5.7)
Mean Plasma Glucose: 143 mg/dL

## 2016-03-30 ENCOUNTER — Ambulatory Visit: Payer: Medicare Other | Admitting: Physician Assistant

## 2016-04-07 ENCOUNTER — Other Ambulatory Visit: Payer: Self-pay | Admitting: Internal Medicine

## 2016-04-07 ENCOUNTER — Other Ambulatory Visit: Payer: Self-pay | Admitting: Physician Assistant

## 2016-04-12 ENCOUNTER — Telehealth: Payer: Self-pay

## 2016-04-12 NOTE — Telephone Encounter (Signed)
Pt. Advised the results are in his my chart . They stated they've already spoken to someone today for verbal results and they were going to mail results as well.

## 2016-04-12 NOTE — Telephone Encounter (Signed)
    Patient calling for lab results 

## 2016-05-04 ENCOUNTER — Telehealth: Payer: Self-pay

## 2016-05-04 NOTE — Telephone Encounter (Signed)
PATIENT'S WIFE ([name] KORDBACHEH) JUST NEEDS TO KNOW IF HER HUSBAND HAS EVER HAD A TB TEST DONE HERE? HE IS TRYING TO GET A NEW JOB AND THE COMPANY WANTS TO KNOW. (SHE IS ON HIS 2017 HIPAA FORM) BEST PHONE [phone] (HIS WIFE'S NAME IS [name] KORDBACHEH)  [place]

## 2016-05-06 ENCOUNTER — Telehealth: Payer: Self-pay | Admitting: Emergency Medicine

## 2016-05-06 NOTE — Telephone Encounter (Signed)
Updated wife, TB was never done here on husband.

## 2016-07-19 DIAGNOSIS — I255 Ischemic cardiomyopathy: Secondary | ICD-10-CM | POA: Diagnosis not present

## 2016-07-19 DIAGNOSIS — E1121 Type 2 diabetes mellitus with diabetic nephropathy: Secondary | ICD-10-CM | POA: Diagnosis not present

## 2016-07-19 DIAGNOSIS — N183 Chronic kidney disease, stage 3 (moderate): Secondary | ICD-10-CM | POA: Diagnosis not present

## 2016-07-19 DIAGNOSIS — I119 Hypertensive heart disease without heart failure: Secondary | ICD-10-CM | POA: Diagnosis not present

## 2016-07-19 DIAGNOSIS — I2510 Atherosclerotic heart disease of native coronary artery without angina pectoris: Secondary | ICD-10-CM | POA: Diagnosis not present

## 2016-07-19 DIAGNOSIS — E785 Hyperlipidemia, unspecified: Secondary | ICD-10-CM | POA: Diagnosis not present

## 2016-07-19 DIAGNOSIS — R55 Syncope and collapse: Secondary | ICD-10-CM | POA: Diagnosis not present

## 2016-08-02 ENCOUNTER — Encounter: Payer: Self-pay | Admitting: Physician Assistant

## 2016-08-02 DIAGNOSIS — I2510 Atherosclerotic heart disease of native coronary artery without angina pectoris: Secondary | ICD-10-CM | POA: Insufficient documentation

## 2016-08-26 ENCOUNTER — Encounter: Payer: Self-pay | Admitting: Family Medicine

## 2016-08-26 DIAGNOSIS — I129 Hypertensive chronic kidney disease with stage 1 through stage 4 chronic kidney disease, or unspecified chronic kidney disease: Secondary | ICD-10-CM | POA: Diagnosis not present

## 2016-08-26 DIAGNOSIS — R079 Chest pain, unspecified: Secondary | ICD-10-CM | POA: Diagnosis not present

## 2016-08-26 DIAGNOSIS — E7800 Pure hypercholesterolemia, unspecified: Secondary | ICD-10-CM | POA: Diagnosis not present

## 2016-08-26 DIAGNOSIS — E1129 Type 2 diabetes mellitus with other diabetic kidney complication: Secondary | ICD-10-CM | POA: Diagnosis not present

## 2016-09-01 ENCOUNTER — Telehealth: Payer: Self-pay | Admitting: Family Medicine

## 2016-09-01 ENCOUNTER — Ambulatory Visit (INDEPENDENT_AMBULATORY_CARE_PROVIDER_SITE_OTHER): Payer: Medicare Other | Admitting: Family Medicine

## 2016-09-01 VITALS — BP 152/86 | HR 86 | Temp 98.2°F | Resp 18 | Ht 67.0 in | Wt 144.6 lb

## 2016-09-01 DIAGNOSIS — R55 Syncope and collapse: Secondary | ICD-10-CM | POA: Diagnosis not present

## 2016-09-01 DIAGNOSIS — I10 Essential (primary) hypertension: Secondary | ICD-10-CM | POA: Diagnosis not present

## 2016-09-01 DIAGNOSIS — E118 Type 2 diabetes mellitus with unspecified complications: Secondary | ICD-10-CM | POA: Diagnosis not present

## 2016-09-01 LAB — POCT GLYCOSYLATED HEMOGLOBIN (HGB A1C): Hemoglobin A1C: 7.3

## 2016-09-01 MED ORDER — METOPROLOL SUCCINATE ER 50 MG PO TB24: 50.0000 mg | ORAL_TABLET | Freq: Every day | ORAL | 12 refills | Status: DC

## 2016-09-01 MED ORDER — GLIPIZIDE ER 5 MG PO TB24: 10.0000 mg | ORAL_TABLET | Freq: Every day | ORAL | 3 refills | Status: DC

## 2016-09-01 MED ORDER — ATORVASTATIN CALCIUM 80 MG PO TABS: 80.0000 mg | ORAL_TABLET | Freq: Every day | ORAL | 3 refills | Status: DC

## 2016-09-01 MED ORDER — AMLODIPINE BESYLATE 5 MG PO TABS: 7.5000 mg | ORAL_TABLET | Freq: Every day | ORAL | 3 refills | Status: DC

## 2016-09-01 MED ORDER — GLIPIZIDE ER 5 MG PO TB24: 5.0000 mg | ORAL_TABLET | Freq: Every day | ORAL | 0 refills | Status: DC

## 2016-09-01 NOTE — Progress Notes (Signed)
Patient ID: Adam Reid, male    DOB: [birth year], 71 y.o.   MRN: [id number]  PCP: Harrison Mons, PA-C  Chief Complaint  Patient presents with  . Blood Pressure Check    Subjective:  HPI  71 year old male, current everyday smoker, presents for evaluation of elevated blood pressure readings in spite of adhering to antihypertension medications and recent falls.  He is accompanied today by his wife who is also translating during this visit.  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 Blood pressure highest - lowest range of reading [phone] systolic, and [id number] diastolic. Patient's wife reports she told him to start taking his metoprolol in the morning and amlodipine at night.This regimen improved blood pressure temporarily, recently blood pressure have began to spike higher and he is concerned.  Denies chest pain, dizziness, or headache. His wife reports recent episodes of falling without cause over the past 2 months. Wife has only witnessed one fall and she reports he didn't trip nor completely loose consciousness, but appeared "out of it" reports after falling he didn't know what happened. Most recent fall occurred at the gym after getting out of hot tub while walking he fell and was evaluated by staff at the gym. Again he reports no loss of consciousness.  Last fall occurred as recent as 3 weeks ago. Wife and patient uncertain as to whether or not carotid doppler were ever completed during last hospital stay both denied. No record of carotids on EMR.  Blood sugar / CKD AM fasting glucose 125-130. Wife checks glucose levels daily.  He routinely participates in physical activity at the gym weekly.  Denies numbness and tingling in extremities. Continues to take [place] and Glipizide.    Reports that he was recently seen at [facility] by Dr. [name] and was told renal function was around 20% and creatinine had doubled since last test. Wife reports that at last visit no medication changes were made.  Patient is still making urine at present and there are no immediate plans for dialysis.   Social History   Social History  . Marital status: Married    Spouse name: [name]  . Number of children: 1  . Years of education: 12th grade   Occupational History  . Retired-accountant    Social History Main Topics  . Smoking status: Current Every Day Smoker    Packs/day: 0.25    Years: 48.00    Types: Cigarettes  . Smokeless tobacco: Never Used  . Alcohol use No  . Drug use: No  . Sexual activity: No   Other Topics Concern  . Not on file   Social History Narrative   Originally from [place]. Came to the [place] in 2009, following their son who came here for school.   Married.   Lives with his wife.   Their adult son lives in [place], [place], where he is in optometry school.   Education: High School   Exercise: No    Family History  Problem Relation Age of Onset  . Hypertension Mother   . Hyperlipidemia Mother   . Hypertension Sister   . Kidney disease Father   . Heart disease Brother 52    open heart surgery  . Colon cancer Neg Hx    Review of Systems See HPI Patient Active Problem List   Diagnosis  Date Noted  . CAD (coronary artery disease), native coronary artery 08/02/2016  . Smoker 03/26/2016  . Hypotension 01/29/2016  . Abnormal myocardial perfusion study 01/29/2016  . Depression 12/09/2015  . Hyperlipidemia LDL goal <100 06/28/2014  . Type 2 diabetes mellitus with renal manifestations, controlled ([place])   . HTN (hypertension) 06/24/2014  . Chronic kidney disease (CKD), stage III (moderate) 06/24/2014  . BPH (benign prostatic hyperplasia) 06/24/2014    No Known Allergies  Prior to Admission medications   Medication Sig Start Date End Date Taking?  Authorizing Provider  amLODipine (NORVASC) 5 MG tablet Take 1 tablet (5 mg total) by mouth daily. 09/02/15  Yes Chelle Jeffery, PA-C  aspirin EC 81 MG EC tablet Take 1 tablet (81 mg total) by mouth daily. 12/03/15  Yes Sela Hua, MD  atorvastatin (LIPITOR) 80 MG tablet TAKE 1 TABLET BY MOUTH DAILY 04/09/16  Yes Chelle Jeffery, PA-C  Blood Glucose Monitoring Suppl (ONETOUCH VERIO) w/Device KIT USE UTD 09/03/15  Yes Historical Provider, MD  Cholecalciferol (VITAMIN D-3 PO) Take 1 tablet by mouth See admin instructions. Three times a week. No specific days   Yes Historical Provider, MD  citalopram (CELEXA) 20 MG tablet Take 1 tablet (20 mg total) by mouth daily. 12/09/15  Yes Chelle Jeffery, PA-C  clopidogrel (PLAVIX) 75 MG tablet Take 1 tablet (75 mg total) by mouth daily. 01/31/16  Yes Arbutus Leas, NP  dapagliflozin propanediol (FARXIGA) 5 MG TABS tablet Take 5 mg by mouth daily. 12/09/15  Yes Chelle Jeffery, PA-C  FISH OIL-KRILL OIL PO Take 1 tablet by mouth daily.    Yes Historical Provider, MD  glipiZIDE (GLUCOTROL XL) 10 MG 24 hr tablet Take 1 tablet (10 mg total) by mouth daily with breakfast. 09/02/15  Yes Harrison Mons, PA-C  GlucoCom Lancets MISC Use for home glucose monitoring 09/02/15  Yes Chelle Jeffery, PA-C  glucose blood test strip Test blood sugar once daily. Dx E11.22 03/26/16  Yes Harrison Mons, PA-C  Magnesium 250 MG TABS Take 1 tablet by mouth daily.   Yes Historical Provider, MD  metoprolol succinate (TOPROL-XL) 50 MG 24 hr tablet Take 1 tablet (50 mg total) by mouth daily. Take with or immediately following a meal. 01/31/16  Yes Arbutus Leas, NP  nitroGLYCERIN (NITROSTAT) 0.4 MG SL tablet Place 1 tablet (0.4 mg total) under the tongue every 5 (five) minutes x 3 doses as needed for chest pain. 01/31/16  Yes Arbutus Leas, NP  cyanocobalamin 500 MCG tablet Take 500 mcg by mouth daily. Reported on 12/02/2015    Historical Provider, MD    Past Medical, Surgical Family and Social History  reviewed and updated.    Objective:   Today's Vitals   09/01/16 1358  BP: (!) 152/86  Pulse: 86  Resp: 18  Temp: 98.2 F (36.8 C)  TempSrc: Oral  SpO2: 99%  Weight: 144 lb 9.6 oz (65.6 kg)  Height: _0  (1.702 m)    Wt Readings from Last 3 Encounters:  09/01/16 144 lb 9.6 oz (65.6 kg)  03/26/16 142 lb (64.4 kg)  01/31/16 140 lb 12.8 oz (63.9 kg)   Physical Exam  Constitutional: He is oriented to person, place, and time. He appears well-developed and well-nourished.  HENT:  Head: Normocephalic and atraumatic.  Right Ear: External ear normal.  Left Ear: External ear normal.  Eyes: Conjunctivae and EOM are normal. Pupils are equal, round, and reactive to light.  Neck: Normal range of motion. Neck supple.  Cardiovascular:  Normal rate, regular rhythm, normal heart sounds and intact distal pulses.   Pulmonary/Chest: Effort normal and breath sounds normal.  Musculoskeletal: Normal range of motion.  Neurological: He is alert and oriented to person, place, and time. He has normal strength. He displays a negative Romberg sign. Coordination normal. GCS eye subscore is 4. GCS verbal subscore is 5. GCS motor subscore is 6.  Skin: Skin is warm and dry.  Psychiatric: He has a normal mood and affect. His behavior is normal. Judgment and thought content normal.      Assessment & Plan:  1. Essential hypertension, elevated today and intermittent spikes in pressure are occurring according to the two history of readings patient brought with him to visit. Increase amlodipine 7.5 mg for blood pressure control.  Plan: -Amlodipine (NORVASC) 5 MG tablet; Take 1.5 tablets (7.5 mg total) by mouth daily.   -Discussed throughout visit the need for smoking cessation  2. DM type 2, controlled, with complication ([place]) - POCT glycosylated hemoglobin (Hb A1C), increased from prior 6.6 to today's value of 7.3. -Concerned for worsening renal function, and according to medication contraindication ,  dapagliflozin propanediol Wilder Glade) is being discontinued. Medication is contraindicated with a GFR <30.   3. Syncope, unspecified syncope type - Ambulatory referral to Neurology   Letter sent via my chart and [place] Advising patient to call and schedule follow-up in 3 months.  Follow-up appointment was not requested prior to discharge or during follow-up phone call.  Carroll Sage. [name], MSN, FNP-C [facility] [place]

## 2016-09-01 NOTE — Patient Instructions (Addendum)
Hypertension Increase Amlodipine 7.5 ( 1 tablet plus 1/2 tablet) mg at night.  I have referred you to neurology for syncopal "fall" episodes.   Diabetes Stop the [place] and continue Glucotrol for blood sugar management.    IF you received an x-ray today, you will receive an invoice from [facility]. Please contact [facility] at [phone] with questions or concerns regarding your invoice.   IF you received labwork today, you will receive an invoice from [facility]. Please contact [facility] at [phone] with questions or concerns regarding your invoice.   Our billing staff will not be able to assist you with questions regarding bills from these companies.  You will be contacted with the lab results as soon as they are available. The fastest way to get your results is to activate your My Chart account. Instructions are located on the last page of this paperwork. If you have not heard from [place] regarding the results in 2 weeks, please contact this office.

## 2016-09-01 NOTE — Telephone Encounter (Signed)
Called patient and advised of A1C is 7.3.  We still need to discontinue [place] r/t to worsening kidney function. Continue to monitor blood sugars for now. Follow-up with neurology r/t to syncopal episodes before we consider adding any other antidiabetes medications.   Adam Reid. [name], MSN, FNP-C [facility] [place]

## 2016-09-02 ENCOUNTER — Telehealth: Payer: Self-pay

## 2016-09-02 DIAGNOSIS — E118 Type 2 diabetes mellitus with unspecified complications: Secondary | ICD-10-CM

## 2016-09-02 MED ORDER — GLIPIZIDE ER 5 MG PO TB24: 10.0000 mg | ORAL_TABLET | Freq: Every day | ORAL | 3 refills | Status: DC

## 2016-09-02 NOTE — Telephone Encounter (Signed)
Pharmacy called, they have 2 rx for glipizide   With different directions. Please clarify and resend

## 2016-09-02 NOTE — Telephone Encounter (Signed)
Pt is calling to seek advise on his glucozide medication.  He is wondering if it would be okay if he takes one pill in the morning with breakfast and the other pill with dinner.  Please advise  [phone]

## 2016-09-03 ENCOUNTER — Telehealth: Payer: Self-pay | Admitting: Family Medicine

## 2016-09-03 NOTE — Telephone Encounter (Signed)
Called Adam Reid to clarify, no changes to patient's dose of Glipizide. They had leave the office prior to patient's A1C resulting and I had explained to them if Adam Reid's A1C remained less than 7 we would reduce the glipizide to 5 mg or if greater than 7 we would continue 10 mg daily as he still needs to maintain good glycemic control with monotherapy as we are discontinuing the Wilder Glade that was the reason 5 mg tablets were sent to the pharmacy. A1C increased to 7.3, therefore advise Adam Reid to continue Adam Reid on 10 mg (2 tablets of 5 mg) with breakfast and return in 3 months for A1c recheck. Reviewed plan with Dr. Mingo Amber on Wednesday and UptoDate supports the use of Glipizide in CKD non-dialysis patient.  Also clarifying prescription and dosage amount in progress note for additional clarity.    Adam Sage. [name], MSN, FNP-C [facility] at [place]

## 2016-09-03 NOTE — Telephone Encounter (Signed)
Pt wife is calling to say he is already on glipizide  Best number [phone]

## 2016-09-03 NOTE — Telephone Encounter (Signed)
FNP Harris sent a script yesterday 09/02/2016 for glipizide. She also recommended discontinuing Farxiga. Patient was instructed to decrease his dose of glipizide to 5mg  daily per instructions on Epocrates for GFR <50%. Wife reports that his GFR was checked and was at 20% last week. Will forward to Conrad for her review.

## 2016-09-07 ENCOUNTER — Ambulatory Visit (INDEPENDENT_AMBULATORY_CARE_PROVIDER_SITE_OTHER): Payer: Medicare Other | Admitting: Diagnostic Neuroimaging

## 2016-09-07 ENCOUNTER — Encounter: Payer: Self-pay | Admitting: Diagnostic Neuroimaging

## 2016-09-07 VITALS — BP 128/73 | HR 84 | Ht 65.75 in | Wt 145.4 lb

## 2016-09-07 DIAGNOSIS — R55 Syncope and collapse: Secondary | ICD-10-CM

## 2016-09-07 NOTE — Progress Notes (Signed)
[facility]  PATIENT: Adam Reid DOB: [birth year]  REFERRING CLINICIAN: Molli Barrows, FNP HISTORY FROM: patient  REASON FOR VISIT: new consult    HISTORICAL  CHIEF COMPLAINT:  Chief Complaint  Patient presents with  . Syncope    rm 7, New Pt, wife- [name], "in past 3 mos 3 episodes of passing out very briefly, no LOC"     HISTORY OF PRESENT ILLNESS:   71 year old male here for evaluation of passing out.  Patient has had 3 episodes where without warning he fell down to the ground. He is not sure how he fell down and seems to have lost track of time briefly with each of these events. First episode occurred in November 2017 when he was in  [place], packing up getting ready to come back to the [place]. He had a few seconds of amnesia when he fell down but was able to quickly get back up. Second episode occurred in December 2017. Third episode occurred every 2018 when he was getting out of the [place], did not feel well, and ended up falling down and briefly passing out.  I asked patient and his daughter who translates, multiple times whether or not patient had impairment of consciousness, awareness or memory loss. I think it is clear to me that he did have a brief gap in his recollection of how he fell down, and what happened before he returned to normal consciousness. Therefore I think he did have a brief syncope event with each of these 3 episodes. He did not have any convulsions, tongue biting, postictal confusion. No chest pain, shortness of breath. No prodromal lightheadedness or dizziness. No evidence that he had loss of strength in his legs without impairment in consciousness. No evidence of tripping and falling.    REVIEW OF SYSTEMS: Full 14 system review of systems performed and negative with exception of: Passing out snoring decreased energy disinterest series constipation.  ALLERGIES: No Known Allergies  HOME MEDICATIONS: Outpatient Medications Prior to  Visit  Medication Sig Dispense Refill  . amLODipine (NORVASC) 5 MG tablet Take 1.5 tablets (7.5 mg total) by mouth daily. 90 tablet 3  . aspirin EC 81 MG EC tablet Take 1 tablet (81 mg total) by mouth daily. 30 tablet 5  . atorvastatin (LIPITOR) 80 MG tablet Take 1 tablet (80 mg total) by mouth daily. 90 tablet 3  . Blood Glucose Monitoring Suppl (ONETOUCH VERIO) w/Device KIT USE UTD  0  . Cholecalciferol (VITAMIN D-3 PO) Take 1 tablet by mouth See admin instructions. Three times a week. No specific days    . citalopram (CELEXA) 20 MG tablet Take 1 tablet (20 mg total) by mouth daily. 90 tablet 3  . clopidogrel (PLAVIX) 75 MG tablet Take 1 tablet (75 mg total) by mouth daily. 30 tablet 12  . cyanocobalamin 500 MCG tablet Take 500 mcg by mouth daily. Reported on 12/02/2015    . FISH OIL-KRILL OIL PO Take 1 tablet by mouth daily.     [name] glipiZIDE (GLUCOTROL XL) 5 MG 24 hr tablet Take 2 tablets (10 mg total) by mouth daily with breakfast. 60 tablet 3  . GlucoCom Lancets MISC Use for home glucose monitoring 100 each 3  . glucose blood test strip Test blood sugar once daily. Dx E11.22 100 each 3  . Magnesium 250 MG TABS Take 1 tablet by mouth daily.    . metoprolol succinate (TOPROL-XL) 50 MG 24 hr tablet Take 1 tablet (50 mg total) by mouth daily.  Take with or immediately following a meal. 30 tablet 12  . nitroGLYCERIN (NITROSTAT) 0.4 MG SL tablet Place 1 tablet (0.4 mg total) under the tongue every 5 (five) minutes x 3 doses as needed for chest pain. 25 tablet 2   No facility-administered medications prior to visit.     PAST MEDICAL HISTORY: Past Medical History:  Diagnosis Date  . Chronic kidney disease   . Diabetes mellitus without complication ([place])   . Hyperlipidemia   . Hypertension   . Thyroid disease    was on supplement, taken off by [name]    PAST SURGICAL HISTORY: Past Surgical History:  Procedure Laterality Date  . APPENDECTOMY    . CARDIAC CATHETERIZATION N/A 01/30/2016    Procedure: Left Heart Cath and Coronary Angiography;  Surgeon: [name], MD;  Location: [facility];  Service: Cardiovascular;  Laterality: N/A;    FAMILY HISTORY: Family History  Problem Relation Age of Onset  . Hypertension Mother   . Hyperlipidemia Mother   . Hypertension Sister   . Kidney disease Father   . Heart disease Brother 86    open heart surgery  . Colon cancer Neg Hx     SOCIAL HISTORY:  Social History   Social History  . Marital status: Married    Spouse name: [name]  . Number of children: 1  . Years of education: 12th grade   Occupational History  . Retired-accountant    Social History Main Topics  . Smoking status: Current Every Day Smoker    Packs/day: 0.25    Years: 48.00    Types: Cigarettes  . Smokeless tobacco: Never Used     Comment: 09/07/16 3-4 cigs daily, trying to cut back  . Alcohol use No  . Drug use: No  . Sexual activity: No   Other Topics Concern  . Not on file   Social History Narrative   Originally from [place]. Came to the [place] in 2009, following their son who came here for school.   Married.   Lives with his wife.   Their adult son lives in [place], [place], where he is in optometry school.   Education: High School   Exercise: No     PHYSICAL EXAM  GENERAL EXAM/CONSTITUTIONAL: Vitals:  Vitals:   09/07/16 1405  BP: 128/73  Pulse: 84  Weight: 145 lb 6.4 oz (66 kg)  Height: 5' 5.75" (1.67 m)     Body mass index is 23.65 kg/m.  Visual Acuity Screening   Right eye Left eye Both eyes  Without correction: 20/40 20/40   With correction:        Patient is in no distress; well developed, nourished and groomed; neck is supple  CARDIOVASCULAR:  Examination of carotid arteries is normal; no carotid bruits  Regular rate and rhythm, no murmurs  Examination of peripheral vascular system by observation and palpation is normal  EYES:  Ophthalmoscopic exam of optic discs and posterior segments is normal; no  papilledema or hemorrhages  MUSCULOSKELETAL:  Gait, strength, tone, movements noted in Neurologic exam below  NEUROLOGIC: MENTAL STATUS:  No flowsheet data found.  awake, alert, oriented to person, place and time  recent and remote memory intact  normal attention and concentration  language fluent, comprehension intact, naming intact,   fund of knowledge appropriate  CRANIAL NERVE:   2nd - no papilledema on fundoscopic exam  2nd, 3rd, 4th, 6th - pupils equal and reactive to light, visual fields full to confrontation, extraocular muscles  intact, no nystagmus  5th - facial sensation symmetric  7th - facial strength symmetric  8th - hearing intact  9th - palate elevates symmetrically, uvula midline  11th - shoulder shrug symmetric  12th - tongue protrusion midline  MOTOR:   normal bulk and tone, full strength in the BUE, BLE  SENSORY:   normal and symmetric to light touch, temperature, vibration  COORDINATION:   finger-nose-finger, fine finger movements normal  REFLEXES:   deep tendon reflexes present and symmetric  GAIT/STATION:   narrow based gait; romberg is negative    DIAGNOSTIC DATA (LABS, IMAGING, TESTING) - I reviewed patient records, labs, notes, testing and imaging myself where available.  Lab Results  Component Value Date   WBC 10.1 01/31/2016   HGB 10.6 (L) 01/31/2016   HCT 32.3 (L) 01/31/2016   MCV 84.1 01/31/2016   PLT 258 01/31/2016      Component Value Date/Time   NA 139 03/26/2016 1839   K 3.9 03/26/2016 1839   CL 102 03/26/2016 1839   CO2 26 03/26/2016 1839   GLUCOSE 45 (L) 03/26/2016 1839   BUN 51 (H) 03/26/2016 1839   CREATININE 2.68 (H) 03/26/2016 1839   CALCIUM 9.5 03/26/2016 1839   PROT 7.8 03/26/2016 1839   ALBUMIN 4.9 03/26/2016 1839   AST 18 03/26/2016 1839   ALT 22 03/26/2016 1839   ALKPHOS 90 03/26/2016 1839   BILITOT 0.4 03/26/2016 1839   GFRNONAA 34 (L) 01/31/2016 0358   GFRAA 40 (L) 01/31/2016 0358    Lab Results  Component Value Date   CHOL 154 03/26/2016   HDL 73 03/26/2016   LDLCALC 53 03/26/2016   TRIG 141 03/26/2016   CHOLHDL 2.1 03/26/2016   Lab Results  Component Value Date   HGBA1C 7.3 09/01/2016   No results found for: [id number] Lab Results  Component Value Date   TSH 2.38 03/26/2016    01/30/16 TTE  - Left ventricle: The cavity size was normal. There was moderate   concentric hypertrophy. Systolic function was mildly to   moderately reduced. The estimated ejection fraction was in the   range of 40% to 45%. Severe hypokinesis of the basal-midinferior   myocardium. Hypokinesis of the lateral myocardium. Doppler   parameters are consistent with abnormal left ventricular   relaxation (grade 1 diastolic dysfunction). - Mitral valve: There was moderate regurgitation. - Right atrium: The atrium was mildly dilated. - Pulmonary arteries: Systolic pressure was mildly increased. PA   peak pressure: 32 mm Hg (S).     ASSESSMENT AND PLAN  71 y.o. year old male here with unprovoked syncope x 3 (brief loss of consciousness x 1-2 seconds; no post-ictal confusion; no convulsions; no tongue biting; pro-dromal bad feeling before last event).    Ddx: recurrent syncope (cardiogenic, neurogenic, dehydration, hypoglycemic)  1. Syncope, unspecified syncope type      PLAN: - check MRI brain (eval for syncope) - follow up with cardiology (Dr. Wynonia Lawman) for cardiac syncope workup; he will likely need heart monitor or loop recorder - no driving until syncope / event free x 6 months  Orders Placed This Encounter  Procedures  . MR BRAIN WO CONTRAST   Return in about 2 months (around 11/05/2016).  I reviewed images, labs, notes, records myself. I summarized findings and reviewed with patient, for this high risk condition (recurrent syncope) requiring high complexity decision making.    Penni Bombard, MD 5/63/8756, 4:33 PM Certified in Neurology, Neurophysiology and  Neuroimaging  [facility]  [address] [place], [place] [address] [phone] a

## 2016-09-07 NOTE — Patient Instructions (Addendum)
Thank you for coming to see Adam Reid at [facility]. I hope we have been able to provide you high quality care today.  You may receive a patient satisfaction survey over the next few weeks. We would appreciate your feedback and comments so that we may continue to improve ourselves and the health of our patients.   - I will check MRI brain.  - follow up with Dr. Wynonia Lawman (cardiology)  - According to [facility] law, you can not drive unless you have not passing out spells for at least 6 months and under physician's care.   - Please maintain precautions. Do not participate in activities where a loss of awareness could harm you or someone else. No swimming alone, no tub bathing, no hot tubs, no driving, no operating motorized vehicles (cars, ATVs, motocycles, etc), lawnmowers or power tools. No standing at heights, such as rooftops, ladders or stairs. Avoid hot objects such as stoves, heaters, open fires. Wear a helmet when riding a bicycle, scooter, skateboard, etc. and avoid areas of traffic. Set your water heater to 120 degrees or less.    ~~~~~~~~~~~~~~~~~~~~~~~~~~~~~~~~~~~~~~~~~~~~~~~~~~~~~~~~~~~~~~~~~  DR. Cortney Beissel'S GUIDE TO HAPPY AND HEALTHY LIVING These are some of my general health and wellness recommendations. Some of them may apply to you better than others. Please use common sense as you try these suggestions and feel free to ask me any questions.   ACTIVITY/FITNESS Mental, social, emotional and physical stimulation are very important for brain and body health. Try learning a new activity (arts, music, language, sports, games).  Keep moving your body to the best of your abilities. You can do this at home, inside or outside, the park, community center, gym or anywhere you like. Consider a physical therapist or personal trainer to get started. Consider the app Sworkit. Fitness trackers such as smart-watches, smart-phones or Fitbits can help as well.   NUTRITION Eat more plants:  colorful vegetables, nuts, seeds and berries.  Eat less sugar, salt, preservatives and processed foods.  Avoid toxins such as cigarettes and alcohol.  Drink water when you are thirsty. Warm water with a slice of lemon is an excellent morning drink to start the day.  Consider these websites for more information The Nutrition Source (https://www.henry-hernandez.biz/) Precision Nutrition (WindowBlog.ch)   RELAXATION Consider practicing mindfulness meditation or other relaxation techniques such as deep breathing, prayer, yoga, tai chi, massage. See website mindful.org or the apps Headspace or Calm to help get started.   SLEEP Try to get at least 7-8+ hours sleep per day. Regular exercise and reduced caffeine will help you sleep better. Practice good sleep hygeine techniques. See website sleep.org for more information.   PLANNING Prepare estate planning, living will, healthcare POA documents. Sometimes this is best planned with the help of an attorney. Theconversationproject.org and agingwithdignity.org are excellent resources.

## 2016-09-16 ENCOUNTER — Ambulatory Visit
Admission: RE | Admit: 2016-09-16 | Discharge: 2016-09-16 | Disposition: A | Discharge status: Home / Self Care | Payer: Medicare Other | Source: Ambulatory Visit | Attending: Diagnostic Neuroimaging | Admitting: Diagnostic Neuroimaging

## 2016-09-16 DIAGNOSIS — R55 Syncope and collapse: Secondary | ICD-10-CM

## 2016-09-21 DIAGNOSIS — I2510 Atherosclerotic heart disease of native coronary artery without angina pectoris: Secondary | ICD-10-CM | POA: Diagnosis not present

## 2016-09-21 DIAGNOSIS — N183 Chronic kidney disease, stage 3 (moderate): Secondary | ICD-10-CM | POA: Diagnosis not present

## 2016-09-21 DIAGNOSIS — E1121 Type 2 diabetes mellitus with diabetic nephropathy: Secondary | ICD-10-CM | POA: Diagnosis not present

## 2016-09-21 DIAGNOSIS — R55 Syncope and collapse: Secondary | ICD-10-CM | POA: Diagnosis not present

## 2016-09-21 DIAGNOSIS — E785 Hyperlipidemia, unspecified: Secondary | ICD-10-CM | POA: Diagnosis not present

## 2016-09-21 DIAGNOSIS — I119 Hypertensive heart disease without heart failure: Secondary | ICD-10-CM | POA: Diagnosis not present

## 2016-09-22 DIAGNOSIS — R55 Syncope and collapse: Secondary | ICD-10-CM | POA: Diagnosis not present

## 2016-09-22 DIAGNOSIS — N183 Chronic kidney disease, stage 3 (moderate): Secondary | ICD-10-CM | POA: Diagnosis not present

## 2016-09-22 DIAGNOSIS — E1121 Type 2 diabetes mellitus with diabetic nephropathy: Secondary | ICD-10-CM | POA: Diagnosis not present

## 2016-09-22 DIAGNOSIS — I119 Hypertensive heart disease without heart failure: Secondary | ICD-10-CM | POA: Diagnosis not present

## 2016-09-22 DIAGNOSIS — E785 Hyperlipidemia, unspecified: Secondary | ICD-10-CM | POA: Diagnosis not present

## 2016-09-22 DIAGNOSIS — I2510 Atherosclerotic heart disease of native coronary artery without angina pectoris: Secondary | ICD-10-CM | POA: Diagnosis not present

## 2016-09-23 ENCOUNTER — Telehealth: Payer: Self-pay | Admitting: *Deleted

## 2016-09-23 NOTE — Telephone Encounter (Signed)
Per Dr Leta Baptist, spoke with wife, on DPR and informed her his MRI brain results were unremarkable, no major findings. Advised Dr Leta Baptist will continue with current treatment plan. She stated her husband was seen by Dr Wynonia Lawman soon after the referral was placed. Advised she have husband monitor his symptoms and call for any questions, needs proir to FU in May. She verbalized understanding, appreciation.

## 2016-10-05 ENCOUNTER — Other Ambulatory Visit: Payer: Self-pay | Admitting: Physician Assistant

## 2016-10-05 DIAGNOSIS — F32A Depression, unspecified: Secondary | ICD-10-CM

## 2016-10-05 DIAGNOSIS — F329 Major depressive disorder, single episode, unspecified: Secondary | ICD-10-CM

## 2016-10-06 ENCOUNTER — Telehealth: Payer: Self-pay | Admitting: *Deleted

## 2016-10-06 NOTE — Telephone Encounter (Signed)
Called patient's wife, [name], on [place] and advised her patient's FU needs to be rescheduled. Dr on vacation. She stated patient is doing well, is following with cardiology. She was fine with rescheduling in July, first available. She verbalized understanding of call, appt changed.

## 2016-10-16 ENCOUNTER — Encounter: Payer: Self-pay | Admitting: Physician Assistant

## 2016-10-16 ENCOUNTER — Ambulatory Visit (INDEPENDENT_AMBULATORY_CARE_PROVIDER_SITE_OTHER): Payer: Medicare Other | Admitting: Physician Assistant

## 2016-10-16 VITALS — BP 124/72 | HR 85 | Temp 98.2°F | Resp 16 | Ht 67.0 in | Wt 146.0 lb

## 2016-10-16 DIAGNOSIS — Z111 Encounter for screening for respiratory tuberculosis: Secondary | ICD-10-CM | POA: Diagnosis not present

## 2016-10-16 NOTE — Patient Instructions (Signed)
     IF you received an x-ray today, you will receive an invoice from [facility]. Please contact [facility] at [phone] with questions or concerns regarding your invoice.   IF you received labwork today, you will receive an invoice from [facility]. Please contact [facility] at [phone] with questions or concerns regarding your invoice.   Our billing staff will not be able to assist you with questions regarding bills from these companies.  You will be contacted with the lab results as soon as they are available. The fastest way to get your results is to activate your My Chart account. Instructions are located on the last page of this paperwork. If you have not heard from us regarding the results in 2 weeks, please contact this office.     

## 2016-10-16 NOTE — Progress Notes (Signed)
  Tuberculosis Risk Questionnaire  1. No Were you born outside the [place] in one of the following parts of the world: [place], [place], [place], [place] or [place]?    2. No Have you traveled outside the [place] and lived for more than one month in one of the following parts of the world: [place], [place], [place], [place] or [place]?    3. Yes  Do you have a compromised immune system such as from any of the following conditions:HIV/AIDS, organ or bone marrow transplantation, diabetes, immunosuppressive medicines (e.g. Prednisone, Remicaide), leukemia, lymphoma, cancer of the head or neck, gastrectomy or jejunal bypass, end-stage renal disease (on dialysis), or silicosis?     4. No Have you ever or do you plan on working in: a residential care center, a health care facility, a jail or prison or homeless shelter?    5. No Have you ever: injected illegal drugs, used crack cocaine, lived in a homeless shelter  or been in jail or prison?     6. No Have you ever been exposed to anyone with infectious tuberculosis?    Tuberculosis Symptom Questionnaire  Do you currently have any of the following symptoms?  1. No Unexplained cough lasting more than 3 weeks?   2. No Unexplained fever lasting more than 3 weeks.   3. No Night Sweats (sweating that leaves the bedclothes and sheets wet)     4. No Shortness of Breath   5. No Chest Pain   6. No Unintentional weight loss    7. No Unexplained fatigue (very tired for no reason)

## 2016-10-17 NOTE — Progress Notes (Signed)
PRIMARY CARE AT [facility] [address], [place] [address] [phone]  Date:  10/16/2016   Name:  Adam Reid   DOB:  [birth year]   MRN:  [id number]  PCP:  Harrison Mons, PA-C    History of Present Illness:  Adam Reid is a 71 y.o. male patient who presents to PCP with  Chief Complaint  Patient presents with  . Immunizations    TB     Patient request tb.  He will be woorking with a patient in helping with groceries and travel for the individual.  It was requested that he have a TB.   He has never had a positive PPD. . Tuberculosis Risk Questionnaire  1. No Were you born outside the [place] in one of the following parts of the world: [place], [place], [place], [place] or [place]?    2. No Have you traveled outside the [place] and lived for more than one month in one of the following parts of the world: [place], [place], [place], [place] or [place]?    3. Yes  Do you have a compromised immune system such as from any of the following conditions:HIV/AIDS, organ or bone marrow transplantation, diabetes, immunosuppressive medicines (e.g. Prednisone, Remicaide), leukemia, lymphoma, cancer of the head or neck, gastrectomy or jejunal bypass, end-stage renal disease (on dialysis), or silicosis?     4. No Have you ever or do you plan on working in: a residential care center, a health care facility, a jail or prison or homeless shelter?    5. No Have you ever: injected illegal drugs, used crack cocaine, lived in a homeless shelter  or been in jail or prison?     6. No Have you ever been exposed to anyone with infectious tuberculosis?    Tuberculosis Symptom Questionnaire  Do you currently have any of the following symptoms?  1. No Unexplained cough lasting more than 3 weeks?   2. No Unexplained fever lasting more than 3 weeks.   3. No Night Sweats (sweating that leaves the bedclothes and sheets wet)     4. No Shortness of Breath   5. No  Chest Pain   6. No Unintentional weight loss    7. No Unexplained fatigue (very tired for no reason)      Patient Active Problem List   Diagnosis Date Noted  . CAD (coronary artery disease), native coronary artery 08/02/2016  . Smoker 03/26/2016  . Hypotension 01/29/2016  . Abnormal myocardial perfusion study 01/29/2016  . Depression 12/09/2015  . Hyperlipidemia LDL goal <100 06/28/2014  . Type 2 diabetes mellitus with renal manifestations, controlled ([place])   . HTN (hypertension) 06/24/2014  . Chronic kidney disease (CKD), stage III (moderate) 06/24/2014  . BPH (benign prostatic hyperplasia) 06/24/2014    Past Medical History:  Diagnosis Date  . Chronic kidney disease   . Diabetes mellitus without complication ([place])   . Hyperlipidemia   . Hypertension   . Thyroid disease    was on supplement, taken off by [name]    Past Surgical History:  Procedure Laterality Date  . APPENDECTOMY    . CARDIAC CATHETERIZATION N/A 01/30/2016   Procedure: Left Heart Cath and Coronary Angiography;  Surgeon: [name], MD;  Location: [facility];  Service: Cardiovascular;  Laterality: N/A;    Social History  Substance Use Topics  . Smoking status: Current Every Day Smoker    Packs/day: 0.25    Years: 48.00  Types: Cigarettes  . Smokeless tobacco: Never Used     Comment: 09/07/16 3-4 cigs daily, trying to cut back  . Alcohol use No    Family History  Problem Relation Age of Onset  . Hypertension Mother   . Hyperlipidemia Mother   . Hypertension Sister   . Kidney disease Father   . Heart disease Brother 66    open heart surgery  . Colon cancer Neg Hx     No Known Allergies  Medication list has been reviewed and updated.  Current Outpatient Prescriptions on File Prior to Visit  Medication Sig Dispense Refill  . amLODipine (NORVASC) 5 MG tablet Take 1.5 tablets (7.5 mg total) by mouth daily. 90 tablet 3  . aspirin EC 81 MG EC tablet Take 1 tablet (81 mg  total) by mouth daily. 30 tablet 5  . atorvastatin (LIPITOR) 80 MG tablet Take 1 tablet (80 mg total) by mouth daily. 90 tablet 3  . Blood Glucose Monitoring Suppl (ONETOUCH VERIO) w/Device KIT USE UTD  0  . Cholecalciferol (VITAMIN D-3 PO) Take 1 tablet by mouth See admin instructions. Three times a week. No specific days    . citalopram (CELEXA) 20 MG tablet TAKE 1 TABLET BY MOUTH DAILY 90 tablet 0  . clopidogrel (PLAVIX) 75 MG tablet Take 1 tablet (75 mg total) by mouth daily. 30 tablet 12  . cyanocobalamin 500 MCG tablet Take 500 mcg by mouth daily. Reported on 12/02/2015    . glipiZIDE (GLUCOTROL XL) 5 MG 24 hr tablet Take 2 tablets (10 mg total) by mouth daily with breakfast. 60 tablet 3  . GlucoCom Lancets MISC Use for home glucose monitoring 100 each 3  . glucose blood test strip Test blood sugar once daily. Dx E11.22 100 each 3  . metoprolol succinate (TOPROL-XL) 50 MG 24 hr tablet Take 1 tablet (50 mg total) by mouth daily. Take with or immediately following a meal. 30 tablet 12  . nitroGLYCERIN (NITROSTAT) 0.4 MG SL tablet Place 1 tablet (0.4 mg total) under the tongue every 5 (five) minutes x 3 doses as needed for chest pain. 25 tablet 2   No current facility-administered medications on file prior to visit.     ROS ROS otherwise unremarkable unless listed above.  Physical Examination: BP 124/72   Pulse 85   Temp 98.2 F (36.8 C) (Oral)   Resp 16   Ht '5\' 7"'$  (1.702 m)   Wt 146 lb (66.2 kg)   SpO2 100%   BMI 22.87 kg/m  Ideal Body Weight: Weight in (lb) to have BMI = 25: 159.3  Physical Exam  Constitutional: He is oriented to person, place, and time. He appears well-developed and well-nourished. No distress.  HENT:  Head: Normocephalic and atraumatic.  Eyes: Conjunctivae and EOM are normal. Pupils are equal, round, and reactive to light.  Cardiovascular: Normal rate.   Pulmonary/Chest: Effort normal. No respiratory distress.  Neurological: He is alert and oriented to  person, place, and time.  Skin: Skin is warm and dry. He is not diaphoretic.  Psychiatric: He has a normal mood and affect. His behavior is normal.     Assessment and Plan: Adam Reid is a 71 y.o. male who is here today for tb test. PPD screening test - Plan: TB Skin Test  Ivar Drape, PA-C [facility] 4/8/20189:13 PM

## 2016-10-18 ENCOUNTER — Ambulatory Visit (INDEPENDENT_AMBULATORY_CARE_PROVIDER_SITE_OTHER): Payer: Medicare Other | Admitting: Physician Assistant

## 2016-10-18 DIAGNOSIS — Z111 Encounter for screening for respiratory tuberculosis: Secondary | ICD-10-CM

## 2016-10-18 LAB — TB SKIN TEST
INDURATION: 0 mm
TB SKIN TEST: NEGATIVE

## 2016-10-18 NOTE — Progress Notes (Signed)
Patient here to have TB skin test read. Negative results.

## 2016-10-26 DIAGNOSIS — I119 Hypertensive heart disease without heart failure: Secondary | ICD-10-CM | POA: Diagnosis not present

## 2016-10-26 DIAGNOSIS — N183 Chronic kidney disease, stage 3 (moderate): Secondary | ICD-10-CM | POA: Diagnosis not present

## 2016-10-26 DIAGNOSIS — E785 Hyperlipidemia, unspecified: Secondary | ICD-10-CM | POA: Diagnosis not present

## 2016-10-26 DIAGNOSIS — E1121 Type 2 diabetes mellitus with diabetic nephropathy: Secondary | ICD-10-CM | POA: Diagnosis not present

## 2016-10-26 DIAGNOSIS — R55 Syncope and collapse: Secondary | ICD-10-CM | POA: Diagnosis not present

## 2016-10-26 DIAGNOSIS — I2510 Atherosclerotic heart disease of native coronary artery without angina pectoris: Secondary | ICD-10-CM | POA: Diagnosis not present

## 2016-11-11 ENCOUNTER — Encounter: Payer: Self-pay | Admitting: Physician Assistant

## 2016-11-17 ENCOUNTER — Other Ambulatory Visit: Payer: Self-pay | Admitting: Physician Assistant

## 2016-11-17 DIAGNOSIS — N189 Chronic kidney disease, unspecified: Secondary | ICD-10-CM

## 2016-11-17 DIAGNOSIS — I10 Essential (primary) hypertension: Secondary | ICD-10-CM

## 2016-11-23 ENCOUNTER — Ambulatory Visit: Payer: Medicare Other | Admitting: Diagnostic Neuroimaging

## 2016-11-27 ENCOUNTER — Other Ambulatory Visit: Payer: Self-pay | Admitting: Family Medicine

## 2016-11-27 DIAGNOSIS — E118 Type 2 diabetes mellitus with unspecified complications: Secondary | ICD-10-CM

## 2016-11-30 ENCOUNTER — Other Ambulatory Visit: Payer: Self-pay | Admitting: Physician Assistant

## 2016-11-30 DIAGNOSIS — E118 Type 2 diabetes mellitus with unspecified complications: Secondary | ICD-10-CM

## 2016-12-14 ENCOUNTER — Ambulatory Visit (INDEPENDENT_AMBULATORY_CARE_PROVIDER_SITE_OTHER): Payer: Medicare Other | Admitting: Physician Assistant

## 2016-12-14 ENCOUNTER — Encounter: Payer: Self-pay | Admitting: Physician Assistant

## 2016-12-14 VITALS — BP 124/78 | HR 89 | Temp 97.8°F | Resp 16 | Ht 67.0 in | Wt 141.4 lb

## 2016-12-14 DIAGNOSIS — I10 Essential (primary) hypertension: Secondary | ICD-10-CM | POA: Diagnosis not present

## 2016-12-14 DIAGNOSIS — R351 Nocturia: Secondary | ICD-10-CM

## 2016-12-14 DIAGNOSIS — E1122 Type 2 diabetes mellitus with diabetic chronic kidney disease: Secondary | ICD-10-CM | POA: Diagnosis not present

## 2016-12-14 DIAGNOSIS — E785 Hyperlipidemia, unspecified: Secondary | ICD-10-CM

## 2016-12-14 DIAGNOSIS — N401 Enlarged prostate with lower urinary tract symptoms: Secondary | ICD-10-CM | POA: Diagnosis not present

## 2016-12-14 DIAGNOSIS — R3915 Urgency of urination: Secondary | ICD-10-CM

## 2016-12-14 DIAGNOSIS — Z23 Encounter for immunization: Secondary | ICD-10-CM | POA: Diagnosis not present

## 2016-12-14 DIAGNOSIS — N183 Chronic kidney disease, stage 3 unspecified: Secondary | ICD-10-CM

## 2016-12-14 MED ORDER — TAMSULOSIN HCL 0.4 MG PO CAPS: 0.4000 mg | ORAL_CAPSULE | Freq: Every day | ORAL | 3 refills | Status: DC

## 2016-12-14 MED ORDER — ZOSTER VAC RECOMB ADJUVANTED 50 MCG/0.5ML IM SUSR: 0.5000 mL | Freq: Once | INTRAMUSCULAR | 1 refills | Status: AC

## 2016-12-14 NOTE — Assessment & Plan Note (Signed)
Await labs. Adjust regimen as indicated by results.  

## 2016-12-14 NOTE — Progress Notes (Signed)
Subjective:    Patient ID: Adam Reid, male    DOB: [birth year], 71 y.o.   MRN: [id number]  PCP: Harrison Mons, PA-C Chief Complaint  Patient presents with  . Diabetes    follow up    HPI: 71 y/o M presents today for diabetes and hypertension follow up. Wife in room translating. At home blood pressure ranges from 115-125/ 65-75. Sugar checks in the morning have increased to about 140 (previously 120-130) since d/c Metformin, which is concerning to patient. Wonders if he needs to continue Lipitor at 80 mg. Also complains of constipation. Has been using Beano OTC with minimal relief.  Continues to exercise regularly and eat well balanced diet.  Nephrology appointment next week.   Patient Active Problem List   Diagnosis Date Noted  . CAD (coronary artery disease), native coronary artery 08/02/2016  . Smoker 03/26/2016  . Hypotension 01/29/2016  . Abnormal myocardial perfusion study 01/29/2016  . Depression 12/09/2015  . Hyperlipidemia LDL goal <100 06/28/2014  . Type 2 diabetes mellitus with renal manifestations, controlled ([place])   . HTN (hypertension) 06/24/2014  . 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 BPH (benign prostatic hyperplasia) 06/24/2014   Prior to Admission medications   Medication Sig Start Date End Date Taking? Authorizing Provider  amLODipine (NORVASC) 5 MG tablet TAKE 1 TABLET(5 MG) BY MOUTH DAILY 11/17/16  Yes Jeffery, Chelle, PA-C  aspirin EC 81 MG EC tablet Take 1 tablet (81 mg total) by mouth daily. 12/03/15  Yes Mayo, Pete Pelt, MD  atorvastatin (LIPITOR) 80 MG tablet Take 1 tablet (80 mg total) by mouth daily. 09/01/16  Yes Scot Jun, FNP  Blood Glucose Monitoring Suppl (ONETOUCH VERIO) w/Device KIT USE UTD 09/03/15  Yes [provider]  Cholecalciferol (VITAMIN D-3 PO) Take 1 tablet by mouth See admin instructions. Three times a week. No specific days   Yes [provider]    citalopram (CELEXA) 20 MG tablet TAKE 1 TABLET BY MOUTH DAILY 10/07/16  Yes Weber, Damaris Hippo, PA-C  clopidogrel (PLAVIX) 75 MG tablet Take 1 tablet (75 mg total) by mouth daily. 01/31/16  Yes Arbutus Leas, NP  cyanocobalamin 500 MCG tablet Take 500 mcg by mouth daily. Reported on 12/02/2015   Yes [provider]  glipiZIDE (GLUCOTROL XL) 5 MG 24 hr tablet TAKE 2 TABLETS BY MOUTH EVERY DAY WITH BREAKFAST 12/01/16  Yes Harrison Mons, PA-C  GlucoCom Lancets MISC Use for home glucose monitoring 09/02/15  Yes Jeffery, Chelle, PA-C  glucose blood test strip Test blood sugar once daily. Dx E11.22 03/26/16  Yes Jeffery, Chelle, PA-C  metoprolol succinate (TOPROL-XL) 50 MG 24 hr tablet Take 1 tablet (50 mg total) by mouth daily. Take with or immediately following a meal. 09/01/16  Yes Scot Jun, FNP  nitroGLYCERIN (NITROSTAT) 0.4 MG SL tablet Place 1 tablet (0.4 mg total) under the tongue every 5 (five) minutes x 3 doses as needed for chest pain. 01/31/16  Yes Arbutus Leas, NP  amLODipine (NORVASC) 5 MG tablet Take 1.5 tablets (7.5 mg total) by mouth daily. Patient not taking: Reported on 12/14/2016 09/01/16   Scot Jun, FNP   No Known Allergies  Review of Systems  HENT: Negative.   Eyes: Negative for visual disturbance.  Respiratory: Negative.   Cardiovascular: Negative.   Gastrointestinal: Positive for constipation.  Endocrine: Positive for polyuria (3-4 times a night).  Genitourinary: Positive for difficulty urinating and urgency. Negative  for hematuria.  Musculoskeletal: Negative.   Neurological: Negative.  Negative for dizziness, facial asymmetry, light-headedness and headaches.  Hematological: Negative.   Psychiatric/Behavioral: Negative.        Objective:   Physical Exam  Constitutional: He is oriented to person, place, and time. He appears well-developed and well-nourished. No distress.  BP 124/78 (BP Location: Right Arm, Patient Position: Sitting, Cuff Size: Normal)    Pulse 89   Temp 97.8 F (36.6 C) (Oral)   Resp 16   Ht '5\' 7"'$  (1.702 m)   Wt 141 lb 6.4 oz (64.1 kg)   SpO2 99%   BMI 22.15 kg/m    HENT:  Head: Normocephalic and atraumatic.  Eyes: Conjunctivae and EOM are normal. Pupils are equal, round, and reactive to light.  Neck: Normal range of motion. Neck supple. No thyromegaly present.  Cardiovascular: Normal rate, regular rhythm, normal heart sounds and intact distal pulses.   Pulmonary/Chest: Effort normal and breath sounds normal.  Lymphadenopathy:    He has no cervical adenopathy.  Neurological: He is alert and oriented to person, place, and time.  Skin: Skin is warm and dry. He is not diaphoretic.  Psychiatric: He has a normal mood and affect. His behavior is normal. Judgment and thought content normal.      Diabetic Foot Exam - Simple   Simple Foot Form Visual Inspection No deformities, no ulcerations, no other skin breakdown bilaterally:  Yes Sensation Testing Intact to touch and monofilament testing bilaterally:  Yes Pulse Check Posterior Tibialis and Dorsalis pulse intact bilaterally:  Yes Comments     Assessment & Plan:  1. Essential hypertension Continue Metoprolol Succinate, Amlodipine, and home blood pressure checks. - Comprehensive metabolic panel - CBC with Differential/Platelet  2. Controlled type 2 diabetes mellitus with stage 3 chronic kidney disease, unspecified whether long term insulin use (HCC) Continue Glipizide and home CBS checks.  - Hemoglobin A1c - Microalbumin, urine  3. Hyperlipidemia LDL goal <100 Continue Atorvastatin. - Comprehensive metabolic panel - Lipid panel  4. Benign prostatic hyperplasia with nocturia - tamsulosin (FLOMAX) 0.4 MG CAPS capsule; Take 1 capsule (0.4 mg total) by mouth daily.  Dispense: 30 capsule; Refill: 3  5. Benign prostatic hyperplasia (BPH) with urinary urgency - Urine culture  6. Need for pneumococcal vaccine - Pneumococcal polysaccharide vaccine  23-valent greater than or equal to 2yo subcutaneous/IM  7. Need for shingles vaccine - Zoster Vac Recomb Adjuvanted (SHINGRIX) injection; Inject 0.5 mLs into the muscle once.  Dispense: 1 each; Refill: 1  Return in about 4 months (around 04/15/2017) for re-evaluation of diaebtes, blood pressure, urine, etc.

## 2016-12-14 NOTE — Assessment & Plan Note (Signed)
Controlled. Continue current regimen. 

## 2016-12-14 NOTE — Assessment & Plan Note (Signed)
Update labs. Adjust regimen with endocrinology, if indicated.

## 2016-12-14 NOTE — Patient Instructions (Addendum)
Continue the Bean-O with your meals. ADD OTC Miralax to help soften the stools.  To help reduce constipation and promote bowel health, 1. Drink plenty of water each day; 2. Eat plenty of fiber (fruits, vegetables, whole grains, legumes) 3. Get plenty of physical activity  If needed, use a stool softener (docusate) or an osmotic laxative (like Miralax) each day, or as needed.     IF you received an x-ray today, you will receive an invoice from [facility]. Please contact [facility] at [phone] with questions or concerns regarding your invoice.   IF you received labwork today, you will receive an invoice from [facility]. Please contact [facility] at [phone] with questions or concerns regarding your invoice.   Our billing staff will not be able to assist you with questions regarding bills from these companies.  You will be contacted with the lab results as soon as they are available. The fastest way to get your results is to activate your My Chart account. Instructions are located on the last page of this paperwork. If you have not heard from [place] regarding the results in 2 weeks, please contact this office.

## 2016-12-14 NOTE — Assessment & Plan Note (Signed)
Nocturia and urgency. Trial of tamsulosin.

## 2016-12-14 NOTE — Progress Notes (Signed)
Patient ID: Adam Reid, male    DOB: 10/01/1945, 71 y.o.   MRN: [id number]  PCP: Harrison Mons, PA-C  Chief Complaint  Patient presents with  . Diabetes    follow up    Subjective:   Presents for evaluation of His chronic medical problems. He is accompanied by his wife, who translates.   Home BP 115/65 or so. Fasting glucose 140, which is higher than prior to discontinuation of metformin (due to progressive renal dysfunction). Diabetes is managed by endocrinology. CKD is advanced, but not end-stage, and he is not on fluid restrictions.  Nocturia persists. Getting up 2-4 times/night to urinate. Also has some urgency and difficulty starting his stream. No hematuria. No dysuria. No incontinence.  A lot of gas and constipation. Uses Bean-O with meals to reduce the gas.   Review of Systems As above. No CP, SOB, HA, dizziness. No nausea, vomiting, diarrhea. No new/unexplained muscle or joint pain.    Patient Active Problem List   Diagnosis Date Noted  . CAD (coronary artery disease), native coronary artery 08/02/2016  . Smoker 03/26/2016  . Hypotension 01/29/2016  . Abnormal myocardial perfusion study 01/29/2016  . Depression 12/09/2015  . Hyperlipidemia LDL goal <100 06/28/2014  . Type 2 diabetes mellitus with renal manifestations, controlled ([place])   . HTN (hypertension) 06/24/2014  . 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 BPH (benign prostatic hyperplasia) 06/24/2014     Prior to Admission medications   Medication Sig Start Date End Date Taking? Authorizing Provider  amLODipine (NORVASC) 5 MG tablet TAKE 1 TABLET(5 MG) BY MOUTH DAILY 11/17/16  Yes Ottis Sarnowski, PA-C  aspirin EC 81 MG EC tablet Take 1 tablet (81 mg total) by mouth daily. 12/03/15  Yes Mayo, Pete Pelt, MD  atorvastatin (LIPITOR) 80 MG tablet Take 1 tablet (80 mg total) by mouth daily. 09/01/16  Yes Scot Jun, FNP  Blood  Glucose Monitoring Suppl (ONETOUCH VERIO) w/Device KIT USE UTD 09/03/15  Yes [provider]  Cholecalciferol (VITAMIN D-3 PO) Take 1 tablet by mouth See admin instructions. Three times a week. No specific days   Yes [provider]  citalopram (CELEXA) 20 MG tablet TAKE 1 TABLET BY MOUTH DAILY 10/07/16  Yes Weber, Damaris Hippo, PA-C  clopidogrel (PLAVIX) 75 MG tablet Take 1 tablet (75 mg total) by mouth daily. 01/31/16  Yes Arbutus Leas, NP  cyanocobalamin 500 MCG tablet Take 500 mcg by mouth daily. Reported on 12/02/2015   Yes [provider]  glipiZIDE (GLUCOTROL XL) 5 MG 24 hr tablet TAKE 2 TABLETS BY MOUTH EVERY DAY WITH BREAKFAST 12/01/16  Yes Harrison Mons, PA-C  GlucoCom Lancets MISC Use for home glucose monitoring 09/02/15  Yes Amine Adelson, PA-C  glucose blood test strip Test blood sugar once daily. Dx E11.22 03/26/16  Yes Michall Noffke, PA-C  metoprolol succinate (TOPROL-XL) 50 MG 24 hr tablet Take 1 tablet (50 mg total) by mouth daily. Take with or immediately following a meal. 09/01/16  Yes Scot Jun, FNP  nitroGLYCERIN (NITROSTAT) 0.4 MG SL tablet Place 1 tablet (0.4 mg total) under the tongue every 5 (five) minutes x 3 doses as needed for chest pain. 01/31/16  Yes Arbutus Leas, NP  amLODipine (NORVASC) 5 MG tablet Take 1.5 tablets (7.5 mg total) by mouth daily. Patient not taking: Reported on 12/14/2016 09/01/16   Scot Jun, FNP     No Known Allergies  Objective:  Physical Exam  Constitutional: He is oriented to person, place, and time. He appears well-developed and well-nourished. He is active and cooperative. No distress.  BP 124/78 (BP Location: Right Arm, Patient Position: Sitting, Cuff Size: Normal)   Pulse 89   Temp 97.8 F (36.6 C) (Oral)   Resp 16   Ht _0  (1.702 m)   Wt 141 lb 6.4 oz (64.1 kg)   SpO2 99%   BMI 22.15 kg/m   HENT:  Head: Normocephalic and atraumatic.  Right Ear: Hearing normal.  Left Ear: Hearing  normal.  Eyes: Conjunctivae are normal. No scleral icterus.  Neck: Normal range of motion. Neck supple. No thyromegaly present.  Cardiovascular: Normal rate, regular rhythm and normal heart sounds.   Pulses:      Radial pulses are 2+ on the right side, and 2+ on the left side.  Pulmonary/Chest: Effort normal and breath sounds normal.  Lymphadenopathy:       Head (right side): No tonsillar, no preauricular, no posterior auricular and no occipital adenopathy present.       Head (left side): No tonsillar, no preauricular, no posterior auricular and no occipital adenopathy present.    He has no cervical adenopathy.       Right: No supraclavicular adenopathy present.       Left: No supraclavicular adenopathy present.  Neurological: He is alert and oriented to person, place, and time. No sensory deficit.  Skin: Skin is warm, dry and intact. No rash noted. No cyanosis or erythema. Nails show no clubbing.  Psychiatric: He has a normal mood and affect. His speech is normal and behavior is normal.    Diabetic Foot Exam - Simple   Simple Foot Form Visual Inspection No deformities, no ulcerations, no other skin breakdown bilaterally:  Yes Sensation Testing Intact to touch and monofilament testing bilaterally:  Yes Pulse Check Posterior Tibialis and Dorsalis pulse intact bilaterally:  Yes Comments        Assessment & Plan:   Problem List Items Addressed This Visit    HTN (hypertension) - Primary (Chronic)    Controlled. Continue current regimen.      Relevant Orders   Comprehensive metabolic panel   CBC with Differential/Platelet   BPH (benign prostatic hyperplasia) (Chronic)    Nocturia and urgency. Trial of tamsulosin.      Relevant Medications   tamsulosin (FLOMAX) 0.4 MG CAPS capsule   Type 2 diabetes mellitus with renal manifestations, controlled (H[facility]) (Chronic)    Update labs. Adjust regimen with endocrinology, if indicated.      Relevant Orders   Hemoglobin A1c    Microalbumin, urine   Hyperlipidemia LDL goal <100 (Chronic)    Await labs. Adjust regimen as indicated by results.       Relevant Orders   Comprehensive metabolic panel   Lipid panel    Other Visit Diagnoses    Benign prostatic hyperplasia (BPH) with urinary urgency       Relevant Medications   tamsulosin (FLOMAX) 0.4 MG CAPS capsule   Other Relevant Orders   Urine culture   Need for pneumococcal vaccine       Relevant Orders   Pneumococcal polysaccharide vaccine 23-valent greater than or equal to 2yo subcutaneous/IM (Completed)   Need for shingles vaccine       Relevant Medications   Zoster Vac Recomb Adjuvanted [facility]) injection       Return in about 4 months (around 04/15/2017) for re-evaluation of diaebtes, blood pressure, urine, etc.  Fara Chute, PA-C [facility]

## 2016-12-15 LAB — COMPREHENSIVE METABOLIC PANEL
A/G RATIO: 1.8 (ref 1.2–2.2)
ALBUMIN: 5 g/dL — AB (ref 3.6–4.8)
ALT: 28 IU/L (ref 0–44)
AST: 24 IU/L (ref 0–40)
Alkaline Phosphatase: 144 IU/L — ABNORMAL HIGH (ref 39–117)
BUN / CREAT RATIO: 19 (ref 10–24)
BUN: 53 mg/dL — AB (ref 8–27)
Bilirubin Total: 0.4 mg/dL (ref 0.0–1.2)
CALCIUM: 9.6 mg/dL (ref 8.6–10.2)
CHLORIDE: 95 mmol/L — AB (ref 96–106)
CO2: 23 mmol/L (ref 18–29)
Creatinine, Ser: 2.74 mg/dL — ABNORMAL HIGH (ref 0.76–1.27)
GFR, EST AFRICAN AMERICAN: 26 mL/min/{1.73_m2} — AB (ref >59)
GFR, EST NON AFRICAN AMERICAN: 23 mL/min/{1.73_m2} — AB (ref >59)
Globulin, Total: 2.8 g/dL (ref 1.5–4.5)
Glucose: 164 mg/dL — ABNORMAL HIGH (ref 65–99)
Potassium: 4 mmol/L (ref 3.5–5.2)
Sodium: 140 mmol/L (ref 134–144)
TOTAL PROTEIN: 7.8 g/dL (ref 6.0–8.5)

## 2016-12-15 LAB — CBC WITH DIFFERENTIAL/PLATELET
Basophils Absolute: 0.1 10*3/uL (ref 0.0–0.2)
Basos: 1 %
EOS (ABSOLUTE): 0.1 10*3/uL (ref 0.0–0.4)
Eos: 1 %
Hematocrit: 35.9 % — ABNORMAL LOW (ref 37.5–51.0)
Hemoglobin: 11.9 g/dL — ABNORMAL LOW (ref 13.0–17.7)
Immature Grans (Abs): 0 10*3/uL (ref 0.0–0.1)
Immature Granulocytes: 0 %
Lymphocytes Absolute: 0.9 10*3/uL (ref 0.7–3.1)
Lymphs: 10 %
MCH: 27.3 pg (ref 26.6–33.0)
MCHC: 33.1 g/dL (ref 31.5–35.7)
MCV: 82 fL (ref 79–97)
Monocytes Absolute: 0.6 10*3/uL (ref 0.1–0.9)
Monocytes: 7 %
Neutrophils Absolute: 7.4 10*3/uL — ABNORMAL HIGH (ref 1.4–7.0)
Neutrophils: 81 %
Platelets: 335 10*3/uL (ref 150–379)
RBC: 4.36 x10E6/uL (ref 4.14–5.80)
RDW: 15.5 % — ABNORMAL HIGH (ref 12.3–15.4)
WBC: 9.1 10*3/uL (ref 3.4–10.8)

## 2016-12-15 LAB — HEMOGLOBIN A1C
Est. average glucose Bld gHb Est-mCnc: 160 mg/dL
Hgb A1c MFr Bld: 7.2 % — ABNORMAL HIGH (ref 4.8–5.6)

## 2016-12-15 LAB — LIPID PANEL
CHOL/HDL RATIO: 2.5 ratio (ref 0.0–5.0)
Cholesterol, Total: 165 mg/dL (ref 100–199)
HDL: 67 mg/dL (ref >39)
LDL Calculated: 66 mg/dL (ref 0–99)
Triglycerides: 158 mg/dL — ABNORMAL HIGH (ref 0–149)
VLDL CHOLESTEROL CAL: 32 mg/dL (ref 5–40)

## 2016-12-15 LAB — URINE CULTURE

## 2016-12-15 LAB — MICROALBUMIN, URINE: MICROALBUM., U, RANDOM: 346.6 ug/mL

## 2016-12-17 MED ORDER — PIOGLITAZONE HCL 15 MG PO TABS: 15.0000 mg | ORAL_TABLET | Freq: Every day | ORAL | 3 refills | Status: DC

## 2016-12-17 NOTE — Addendum Note (Signed)
Addended by: Harrison Mons on: 12/17/2016 01:58 PM   Modules accepted: Orders

## 2016-12-20 ENCOUNTER — Telehealth: Payer: Self-pay | Admitting: Physician Assistant

## 2016-12-20 NOTE — Telephone Encounter (Signed)
PATIENT'S WIFE ([name]) CALLED TO SAY HER HUSBAND SAW CHELLE LAST TUES. (12/14/16) FOR HIS DIABETES. SHE TOLD HIM THAT HE DID NOT NEED ANY NEW MEDICATION FOR HIS TYPE 2 DIABETES. HER PHARMACY CALLED TODAY TO SAY HIS MEDICATION WAS READY AND IT IS CALLED PIOGLITAZONE (ACTOS) 15 MG. SHE HAS NEVER SEEN THIS MEDICINE BEFORE AND SHE WANTS TO KNOW IF HE SHOULD TAKE IT? SHE SAID HE TAKES GLIPIZIDE 5 MG 2 TIMES A DAY. BEST PHONE [phone] (WIFE'S NAME IS [name]) PHARMACY CHOICE IS [facility] ON [address]. [place]

## 2016-12-21 DIAGNOSIS — E1122 Type 2 diabetes mellitus with diabetic chronic kidney disease: Secondary | ICD-10-CM | POA: Diagnosis not present

## 2016-12-21 DIAGNOSIS — N400 Enlarged prostate without lower urinary tract symptoms: Secondary | ICD-10-CM | POA: Diagnosis not present

## 2016-12-21 DIAGNOSIS — N2581 Secondary hyperparathyroidism of renal origin: Secondary | ICD-10-CM | POA: Diagnosis not present

## 2016-12-21 DIAGNOSIS — E7800 Pure hypercholesterolemia, unspecified: Secondary | ICD-10-CM | POA: Diagnosis not present

## 2016-12-21 DIAGNOSIS — I129 Hypertensive chronic kidney disease with stage 1 through stage 4 chronic kidney disease, or unspecified chronic kidney disease: Secondary | ICD-10-CM | POA: Diagnosis not present

## 2016-12-21 DIAGNOSIS — E1129 Type 2 diabetes mellitus with other diabetic kidney complication: Secondary | ICD-10-CM | POA: Diagnosis not present

## 2016-12-21 DIAGNOSIS — N184 Chronic kidney disease, stage 4 (severe): Secondary | ICD-10-CM | POA: Diagnosis not present

## 2016-12-22 NOTE — Telephone Encounter (Signed)
I TALKED WITH Adam Reid ON TUES. (12/21/16) AND SHE SHOWED ME IN THE PATIENT'S CHART WHERE SHE HAD DOCUMENTED ABOUT THE NEW MEDICATION. SHE SENT Adam Reid A MY-CHART MESSAGE. I CALLED HIS WIFE (Adam Reid) AND EXPLAINED IT TO HER. SHE WILL CHECK WITH HER SON TO FIND OUT HOW SHE CAN GET INTO HER HUSBAND'S MY-CHART BECAUSE HE SAT IT UP FOR THEM. SHE WILL CALL BACK IF SHE HAS ANY FURTHER QUESTIONS. (I AM NOT AUTHORIZED TO CLOSE THIS ENCOUNTER-SO PLEASE DO IT FOR ME) [place] [facility] [place]

## 2016-12-29 ENCOUNTER — Encounter: Payer: Self-pay | Admitting: Physician Assistant

## 2016-12-29 DIAGNOSIS — N2581 Secondary hyperparathyroidism of renal origin: Secondary | ICD-10-CM | POA: Insufficient documentation

## 2017-01-08 ENCOUNTER — Other Ambulatory Visit: Payer: Self-pay | Admitting: Physician Assistant

## 2017-01-08 DIAGNOSIS — F329 Major depressive disorder, single episode, unspecified: Secondary | ICD-10-CM

## 2017-01-08 DIAGNOSIS — F32A Depression, unspecified: Secondary | ICD-10-CM

## 2017-01-11 NOTE — Telephone Encounter (Signed)
Please advise 

## 2017-01-24 ENCOUNTER — Ambulatory Visit: Payer: Self-pay | Admitting: Diagnostic Neuroimaging

## 2017-02-01 ENCOUNTER — Ambulatory Visit (INDEPENDENT_AMBULATORY_CARE_PROVIDER_SITE_OTHER): Payer: Medicare Other | Admitting: Diagnostic Neuroimaging

## 2017-02-01 ENCOUNTER — Encounter: Payer: Self-pay | Admitting: Diagnostic Neuroimaging

## 2017-02-01 VITALS — BP 136/75 | HR 94 | Ht 67.0 in | Wt 147.0 lb

## 2017-02-01 DIAGNOSIS — R55 Syncope and collapse: Secondary | ICD-10-CM | POA: Diagnosis not present

## 2017-02-01 NOTE — Progress Notes (Signed)
[facility]  PATIENT: Adam Reid DOB: 1946/02/26  REFERRING CLINICIAN: Molli Barrows, FNP HISTORY FROM: patient and wife (via interpreter) REASON FOR VISIT: new consult    HISTORICAL  CHIEF COMPLAINT:  Chief Complaint  Patient presents with  . Follow-up  . Loss of Consciousness    no more episodes.    HISTORY OF PRESENT ILLNESS:   UPDATE 02/01/17: Since last visit, doing well. No more syncope. Had cardiology follow up and norvasc was reduced. Also drinking more water. No other concerns.  PRIOR HPI (09/07/16): 71 year old male here for evaluation of passing out. Patient has had 3 episodes where without warning he fell down to the ground. He is not sure how he fell down and seems to have lost track of time briefly with each of these events. First episode occurred in November 2017 when he was in  [place], packing up getting ready to come back to the [place]. He had a few seconds of amnesia when he fell down but was able to quickly get back up. Second episode occurred in December 2017. Third episode occurred Feb 2018 when he was getting out of the [place], did not feel well, and ended up falling down and briefly passing out. I asked patient and his daughter who translates, multiple times whether or not patient had impairment of consciousness, awareness or memory loss. I think it is clear to me that he did have a brief gap in his recollection of how he fell down, and what happened before he returned to normal consciousness. Therefore I think he did have a brief syncope event with each of these 3 episodes. He did not have any convulsions, tongue biting, postictal confusion. No chest pain, shortness of breath. No prodromal lightheadedness or dizziness. No evidence that he had loss of strength in his legs without impairment in consciousness. No evidence of tripping and falling.    REVIEW OF SYSTEMS: Full 14 system review of systems performed and negative with exception of:  only as per HPI.   ALLERGIES: No Known Allergies  HOME MEDICATIONS: Outpatient Medications Prior to Visit  Medication Sig Dispense Refill  . amLODipine (NORVASC) 5 MG tablet TAKE 1 TABLET(5 MG) BY MOUTH DAILY 90 tablet 0  . aspirin EC 81 MG EC tablet Take 1 tablet (81 mg total) by mouth daily. 30 tablet 5  . atorvastatin (LIPITOR) 80 MG tablet Take 1 tablet (80 mg total) by mouth daily. 90 tablet 3  . Blood Glucose Monitoring Suppl (ONETOUCH VERIO) w/Device KIT USE UTD  0  . Cholecalciferol (VITAMIN D-3 PO) Take 1 tablet by mouth See admin instructions. Three times a week. No specific days    . citalopram (CELEXA) 20 MG tablet TAKE 1 TABLET BY MOUTH DAILY 90 tablet 3  . clopidogrel (PLAVIX) 75 MG tablet Take 1 tablet (75 mg total) by mouth daily. 30 tablet 12  . cyanocobalamin 500 MCG tablet Take 500 mcg by mouth daily. Reported on 12/02/2015    . glipiZIDE (GLUCOTROL XL) 5 MG 24 hr tablet TAKE 2 TABLETS BY MOUTH EVERY DAY WITH BREAKFAST 180 tablet 0  . GlucoCom Lancets MISC Use for home glucose monitoring 100 each 3  . glucose blood test strip Test blood sugar once daily. Dx E11.22 100 each 3  . metoprolol succinate (TOPROL-XL) 50 MG 24 hr tablet Take 1 tablet (50 mg total) by mouth daily. Take with or immediately following a meal. 30 tablet 12  . nitroGLYCERIN (NITROSTAT) 0.4 MG SL tablet Place 1 tablet (  0.4 mg total) under the tongue every 5 (five) minutes x 3 doses as needed for chest pain. 25 tablet 2  . pioglitazone (ACTOS) 15 MG tablet Take 1 tablet (15 mg total) by mouth daily. 90 tablet 3  . tamsulosin (FLOMAX) 0.4 MG CAPS capsule Take 1 capsule (0.4 mg total) by mouth daily. 30 capsule 3  . amLODipine (NORVASC) 5 MG tablet Take 1.5 tablets (7.5 mg total) by mouth daily. (Patient not taking: Reported on 02/01/2017) 90 tablet 3   No facility-administered medications prior to visit.     PAST MEDICAL HISTORY: Past Medical History:  Diagnosis Date  . Chronic kidney disease   .  Diabetes mellitus without complication ([place])   . Hyperlipidemia   . Hypertension   . Thyroid disease    was on supplement, taken off by [name]    PAST SURGICAL HISTORY: Past Surgical History:  Procedure Laterality Date  . APPENDECTOMY    . CARDIAC CATHETERIZATION N/A 01/30/2016   Procedure: Left Heart Cath and Coronary Angiography;  Surgeon: [name], MD;  Location: [facility];  Service: Cardiovascular;  Laterality: N/A;    FAMILY HISTORY: Family History  Problem Relation Age of Onset  . Hypertension Mother   . Hyperlipidemia Mother   . Hypertension Sister   . Kidney disease Father   . Heart disease Brother 72       open heart surgery  . Colon cancer Neg Hx     SOCIAL HISTORY:  Social History   Social History  . Marital status: Married    Spouse name: [name]  . Number of children: 1  . Years of education: 12th grade   Occupational History  . Retired-accountant    Social History Main Topics  . Smoking status: Current Every Day Smoker    Packs/day: 0.25    Years: 48.00    Types: Cigarettes  . Smokeless tobacco: Never Used     Comment: 09/07/16 3-4 cigs daily, trying to cut back  . Alcohol use No  . Drug use: No  . Sexual activity: No   Other Topics Concern  . Not on file   Social History Narrative   Originally from [place]. Came to the [place] in 2009, following their son who came here for school.   Married.   Lives with his wife.   Their adult son lives in [place], [place], where he is in optometry school.   Education: High School   Exercise: No     PHYSICAL EXAM  GENERAL EXAM/CONSTITUTIONAL: Vitals:  Vitals:   02/01/17 1243  BP: 136/75  Pulse: 94  Weight: 147 lb (66.7 kg)  Height: 5' 7" (1.702 m)   Body mass index is 23.02 kg/m. No exam data present  Patient is in no distress; well developed, nourished and groomed; neck is supple  CARDIOVASCULAR:  Examination of carotid arteries is normal; no carotid bruits  Regular rate and  rhythm, no murmurs  Examination of peripheral vascular system by observation and palpation is normal  EYES:  Ophthalmoscopic exam of optic discs and posterior segments is normal; no papilledema or hemorrhages  MUSCULOSKELETAL:  Gait, strength, tone, movements noted in Neurologic exam below  NEUROLOGIC: MENTAL STATUS:  No flowsheet data found.  awake, alert, oriented to person, place and time  recent and remote memory intact  normal attention and concentration  language fluent, comprehension intact, naming intact,   fund of knowledge appropriate  CRANIAL NERVE:   2nd - no papilledema on fundoscopic exam  2nd, 3rd, 4th, 6th - pupils equal and reactive to light, visual fields full to confrontation, extraocular muscles intact, no nystagmus  5th - facial sensation symmetric  7th - facial strength symmetric  8th - hearing intact  9th - palate elevates symmetrically, uvula midline  11th - shoulder shrug symmetric  12th - tongue protrusion midline  MOTOR:   normal bulk and tone, full strength in the BUE, BLE  SENSORY:   normal and symmetric to light touch, temperature, vibration  COORDINATION:   finger-nose-finger, fine finger movements normal  REFLEXES:   deep tendon reflexes present and symmetric  GAIT/STATION:   narrow based gait; romberg is negative    DIAGNOSTIC DATA (LABS, IMAGING, TESTING) - I reviewed patient records, labs, notes, testing and imaging myself where available.  Lab Results  Component Value Date   WBC 9.1 12/14/2016   HGB 11.9 (L) 12/14/2016   HCT 35.9 (L) 12/14/2016   MCV 82 12/14/2016   PLT 335 12/14/2016      Component Value Date/Time   NA 140 12/14/2016 1140   K 4.0 12/14/2016 1140   CL 95 (L) 12/14/2016 1140   CO2 23 12/14/2016 1140   GLUCOSE 164 (H) 12/14/2016 1140   GLUCOSE 45 (L) 03/26/2016 1839   BUN 53 (H) 12/14/2016 1140   CREATININE 2.74 (H) 12/14/2016 1140   CREATININE 2.68 (H) 03/26/2016 1839   CALCIUM  9.6 12/14/2016 1140   PROT 7.8 12/14/2016 1140   ALBUMIN 5.0 (H) 12/14/2016 1140   AST 24 12/14/2016 1140   ALT 28 12/14/2016 1140   ALKPHOS 144 (H) 12/14/2016 1140   BILITOT 0.4 12/14/2016 1140   GFRNONAA 23 (L) 12/14/2016 1140   GFRAA 26 (L) 12/14/2016 1140   Lab Results  Component Value Date   CHOL 165 12/14/2016   HDL 67 12/14/2016   LDLCALC 66 12/14/2016   TRIG 158 (H) 12/14/2016   CHOLHDL 2.5 12/14/2016   Lab Results  Component Value Date   HGBA1C 7.2 (H) 12/14/2016   No results found for: [id number] Lab Results  Component Value Date   TSH 2.38 03/26/2016    01/30/16 TTE  - Left ventricle: The cavity size was normal. There was moderate   concentric hypertrophy. Systolic function was mildly to   moderately reduced. The estimated ejection fraction was in the   range of 40% to 45%. Severe hypokinesis of the basal-midinferior   myocardium. Hypokinesis of the lateral myocardium. Doppler   parameters are consistent with abnormal left ventricular   relaxation (grade 1 diastolic dysfunction). - Mitral valve: There was moderate regurgitation. - Right atrium: The atrium was mildly dilated. - Pulmonary arteries: Systolic pressure was mildly increased. PA   peak pressure: 32 mm Hg (S).  09/16/16 MRI brain 1.   T2/FLAIR hyperintense foci in the white matter of both hemispheres most consistent with mild chronic microvascular ischemic change. None of the foci appears to be acute. 2.   Mild cortical atrophy 3.   No acute findings.      ASSESSMENT AND PLAN  71 y.o. year old male here with unprovoked syncope x 3 (brief loss of consciousness x 1-2 seconds; no post-ictal confusion; no convulsions; no tongue biting; pro-dromal bad feeling before last event).  Last event in Feb 2018.    Ddx: recurrent syncope (cardiogenic, dehydration, hypoglycemic)  1. Syncope, unspecified syncope type      PLAN:  I spent 15 minutes of face to face time with patient. Greater than 50% of  time was spent  in counseling and coordination of care with patient. In summary we discussed:  SYNCOPE (established problem, improved) - continue current medications - no driving until syncope / event free x 6 months  Return if symptoms worsen or fail to improve, for return to PCP.    Penni Bombard, MD 3/55/7322, 0:25 PM Certified in Neurology, Neurophysiology and Neuroimaging  [facility] [address], [place], [place] [address] [phone] a

## 2017-02-07 ENCOUNTER — Other Ambulatory Visit: Payer: Self-pay | Admitting: Physician Assistant

## 2017-02-07 DIAGNOSIS — E118 Type 2 diabetes mellitus with unspecified complications: Secondary | ICD-10-CM

## 2017-02-26 IMAGING — DX DG CHEST 2V
2 series · 2 of 2 positions shown · non-contrast
Comparison: 12/02/2015

CLINICAL DATA: Coronary artery disease.

EXAM:
CHEST  2 VIEW

[chest pa]
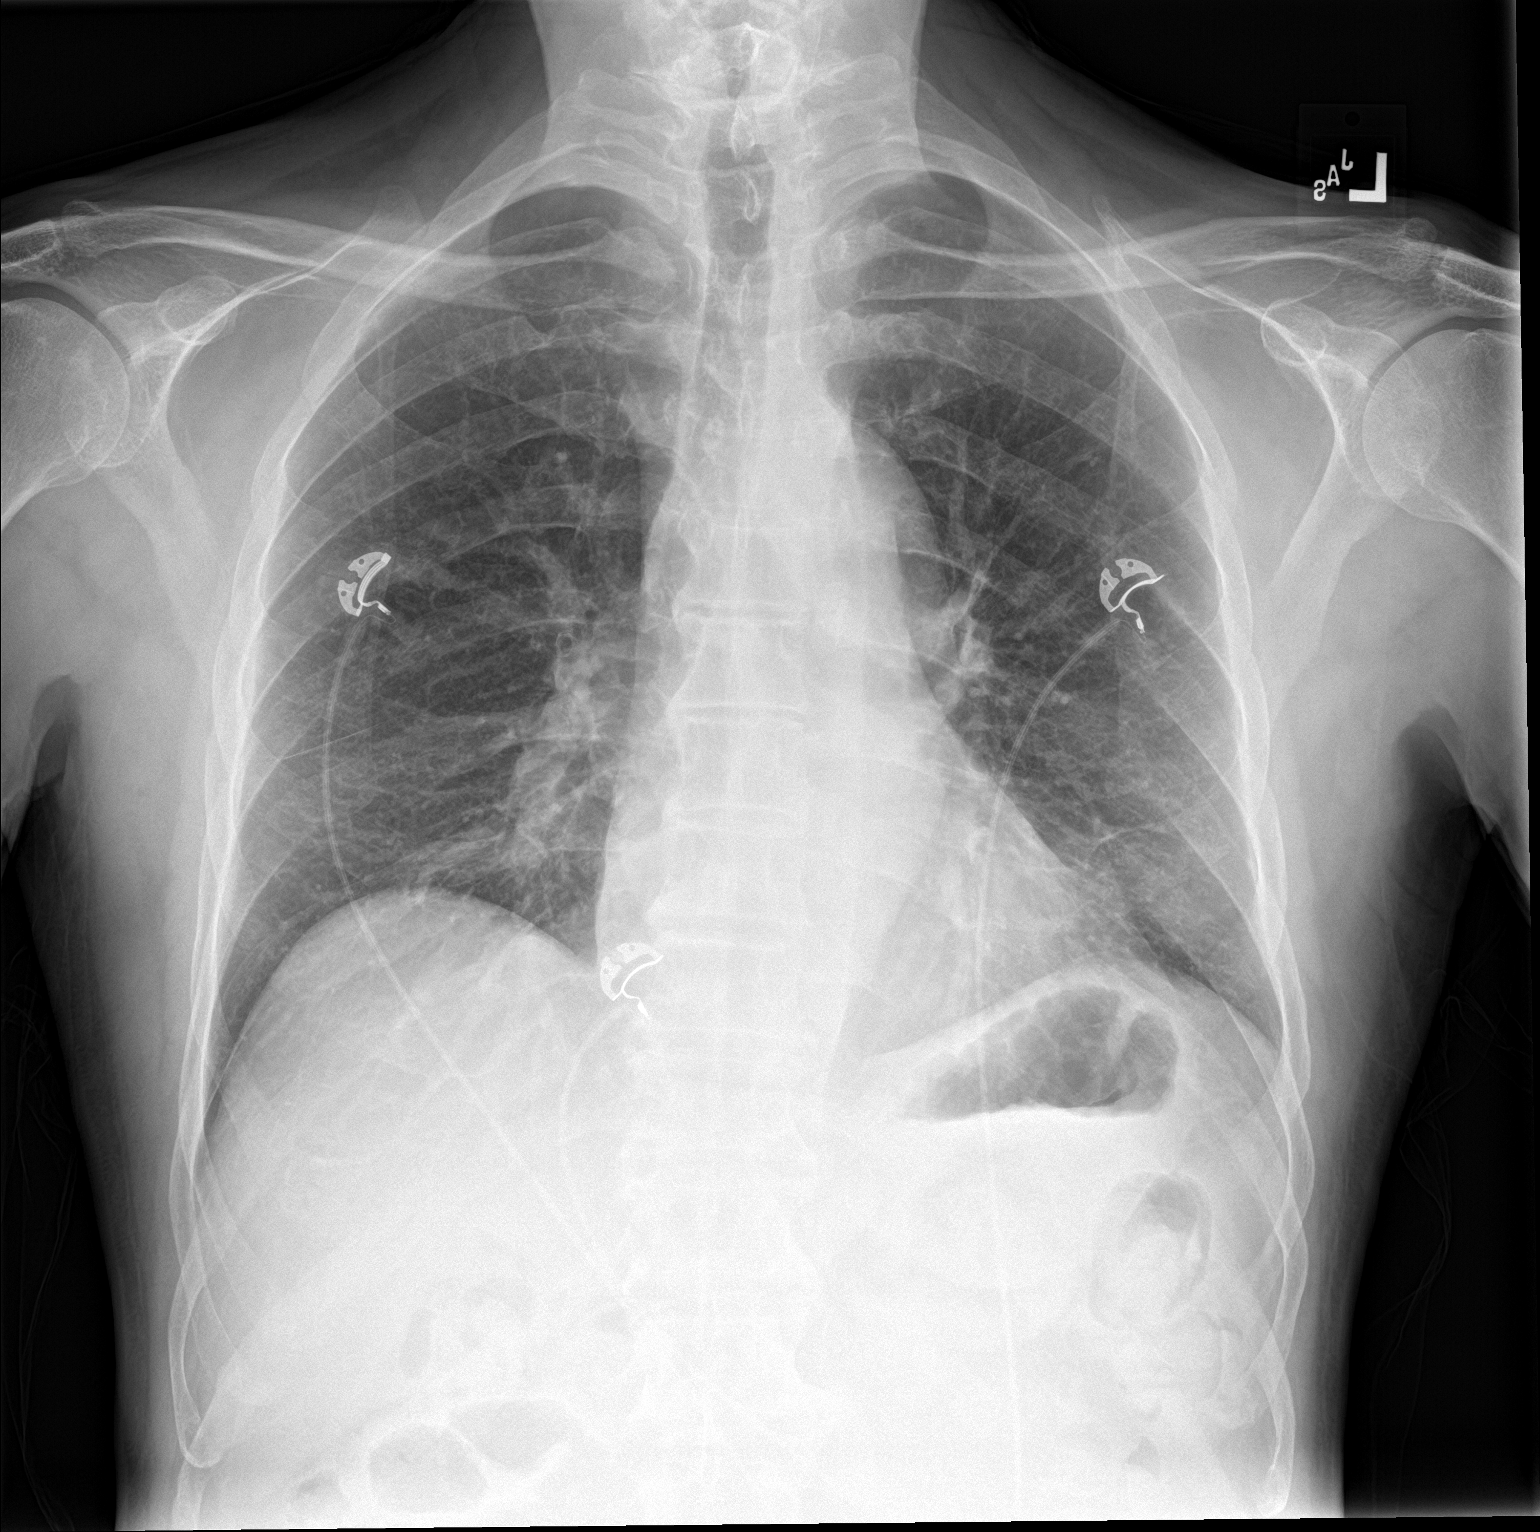

[chest lat]
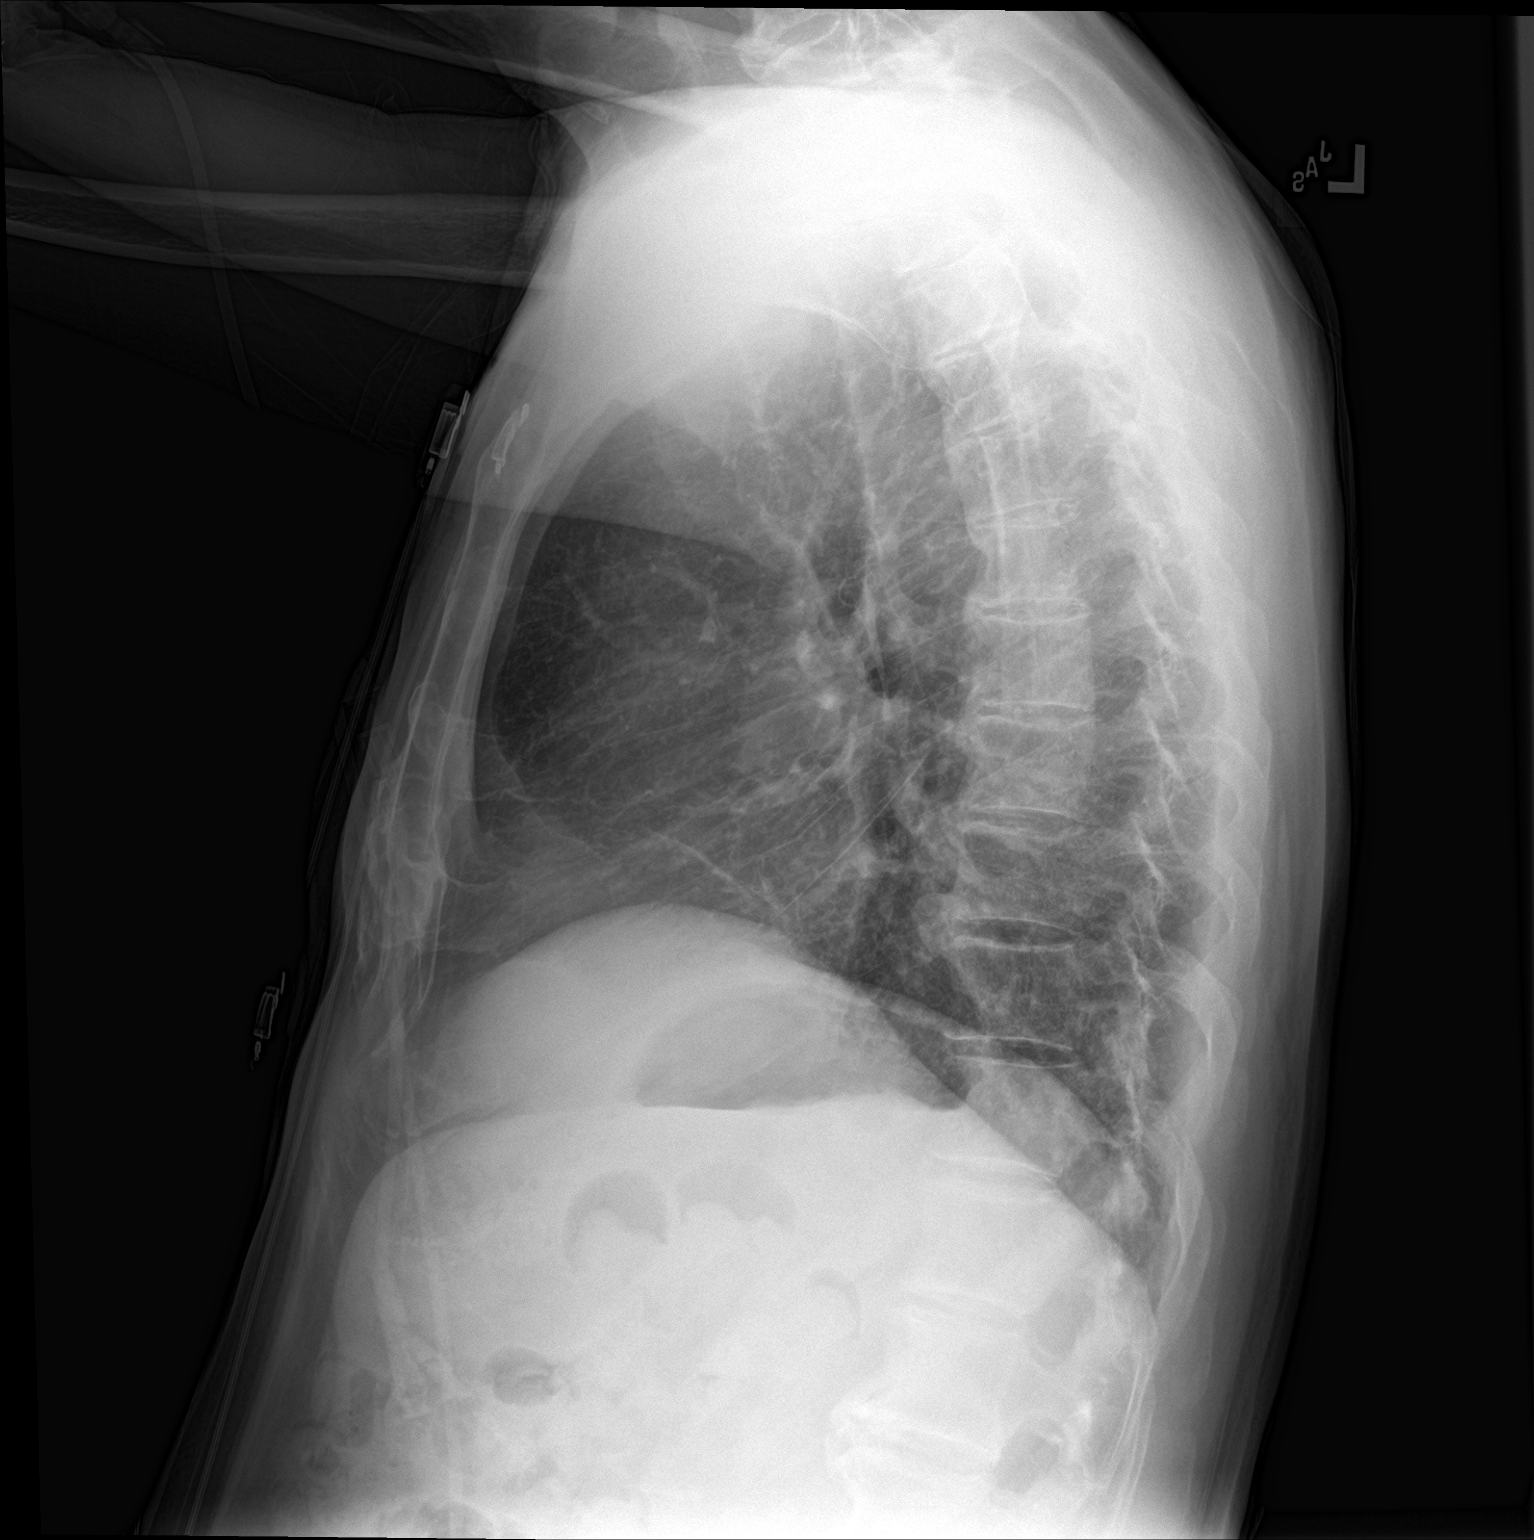

[2 of 2 positions shown; findings below may reference images not displayed]

FINDINGS: Cardiomediastinal silhouette is normal. Mediastinal contours appear
intact. Aorta is torturous.

There is no evidence of focal airspace consolidation, pleural
effusion or pneumothorax.

Osseous structures are without acute abnormality. Soft tissues are
grossly normal.
IMPRESSION: No active cardiopulmonary disease.

## 2017-03-08 DIAGNOSIS — E119 Type 2 diabetes mellitus without complications: Secondary | ICD-10-CM | POA: Diagnosis not present

## 2017-03-08 LAB — HM DIABETES EYE EXAM

## 2017-03-12 ENCOUNTER — Other Ambulatory Visit: Payer: Self-pay

## 2017-03-15 NOTE — Telephone Encounter (Addendum)
MyChart message sent to patient about making an apt for more refills   The medication needs to be refilled it wont let me close encounter until it is

## 2017-03-16 ENCOUNTER — Other Ambulatory Visit: Payer: Self-pay | Admitting: Physician Assistant

## 2017-03-16 MED ORDER — CLOPIDOGREL BISULFATE 75 MG PO TABS: 75.0000 mg | ORAL_TABLET | Freq: Every day | ORAL | 0 refills | Status: DC

## 2017-03-19 ENCOUNTER — Encounter: Payer: Self-pay | Admitting: Physician Assistant

## 2017-03-19 ENCOUNTER — Ambulatory Visit (INDEPENDENT_AMBULATORY_CARE_PROVIDER_SITE_OTHER): Payer: Medicare Other | Admitting: Physician Assistant

## 2017-03-19 VITALS — BP 124/73 | HR 85 | Temp 97.9°F | Resp 16 | Ht 67.0 in | Wt 147.0 lb

## 2017-03-19 DIAGNOSIS — N183 Chronic kidney disease, stage 3 unspecified: Secondary | ICD-10-CM

## 2017-03-19 DIAGNOSIS — E785 Hyperlipidemia, unspecified: Secondary | ICD-10-CM

## 2017-03-19 DIAGNOSIS — E1122 Type 2 diabetes mellitus with diabetic chronic kidney disease: Secondary | ICD-10-CM | POA: Diagnosis not present

## 2017-03-19 DIAGNOSIS — I2510 Atherosclerotic heart disease of native coronary artery without angina pectoris: Secondary | ICD-10-CM | POA: Diagnosis not present

## 2017-03-19 DIAGNOSIS — E118 Type 2 diabetes mellitus with unspecified complications: Secondary | ICD-10-CM | POA: Diagnosis not present

## 2017-03-19 DIAGNOSIS — N401 Enlarged prostate with lower urinary tract symptoms: Secondary | ICD-10-CM

## 2017-03-19 DIAGNOSIS — Z23 Encounter for immunization: Secondary | ICD-10-CM | POA: Diagnosis not present

## 2017-03-19 DIAGNOSIS — R351 Nocturia: Secondary | ICD-10-CM | POA: Diagnosis not present

## 2017-03-19 DIAGNOSIS — I10 Essential (primary) hypertension: Secondary | ICD-10-CM

## 2017-03-19 LAB — POCT GLYCOSYLATED HEMOGLOBIN (HGB A1C): Hemoglobin A1C: 7.2

## 2017-03-19 MED ORDER — TAMSULOSIN HCL 0.4 MG PO CAPS: 0.8000 mg | ORAL_CAPSULE | Freq: Every day | ORAL | 3 refills | Status: DC

## 2017-03-19 MED ORDER — GLIPIZIDE ER 10 MG PO TB24: 10.0000 mg | ORAL_TABLET | Freq: Every day | ORAL | 3 refills | Status: DC

## 2017-03-19 MED ORDER — PIOGLITAZONE HCL 30 MG PO TABS: 30.0000 mg | ORAL_TABLET | Freq: Every day | ORAL | 3 refills | Status: DC

## 2017-03-19 MED ORDER — CLOPIDOGREL BISULFATE 75 MG PO TABS: ORAL_TABLET | ORAL | 3 refills | Status: DC

## 2017-03-19 NOTE — Patient Instructions (Addendum)
INCREASE the pioglitazone from 15 mg to 30 mg. CHANGE the glipizide from two 5 mg tablets to one 10 mg tablet. INCREASE the tamsulosin from 0.4 mg to 0.8 mg each evening.    IF you received an x-ray today, you will receive an invoice from [facility]. Please contact [facility] at [phone] with questions or concerns regarding your invoice.   IF you received labwork today, you will receive an invoice from [facility]. Please contact [facility] at [phone] with questions or concerns regarding your invoice.   Our billing staff will not be able to assist you with questions regarding bills from these companies.  You will be contacted with the lab results as soon as they are available. The fastest way to get your results is to activate your My Chart account. Instructions are located on the last page of this paperwork. If you have not heard from [place] regarding the results in 2 weeks, please contact this office.

## 2017-03-19 NOTE — Assessment & Plan Note (Signed)
Has been well controlled on atorvastatin 80 mg. No changes today.

## 2017-03-19 NOTE — Assessment & Plan Note (Signed)
Controlled. Continue current treatment. 

## 2017-03-19 NOTE — Progress Notes (Signed)
Patient ID: Adam Reid, male    DOB: Feb 16, 1946, 71 y.o.   MRN: [id number]  PCP: Harrison Mons, PA-C  Chief Complaint  Patient presents with  . Medication Refill    Plavix 75 MG    Subjective:   Presents for evaluation of diabetes and medication refills. He is accompanied by his wife, who translates.  Nocturia has improved from 4-5 times to 3 times/night with the addition of tamsulosin.  Last A1C was 7.2%, having stopped metformin due to progressive renal dysfunction. Last saw nephrology 3-4 months ago, and the next visit is in October. Follow-up with cardiology is in October, as well.  At his last visit, I was under the impression that he was seeing endocrinology, but in fact, he is not.  Fasting glucose remains 130-140's. Saw his eye specialist last week.  Review of Systems  Constitutional: Negative.   HENT: Negative.   Eyes: Negative.   Respiratory: Negative.   Cardiovascular: Negative.   Gastrointestinal: Positive for constipation (uses Senna). Negative for abdominal distention, abdominal pain, anal bleeding, blood in stool, diarrhea, nausea, rectal pain and vomiting.  Endocrine: Negative.   Genitourinary: Positive for frequency (nocturia). Negative for difficulty urinating, dysuria, hematuria and urgency.  Musculoskeletal: Negative.   Allergic/Immunologic: Negative.   Neurological: Negative for dizziness, tremors, weakness, light-headedness and headaches.  Hematological: Negative for adenopathy. Does not bruise/bleed easily.  Psychiatric/Behavioral: Positive for sleep disturbance (nocturia). Negative for agitation, decreased concentration and dysphoric mood. The patient is not nervous/anxious.        Patient Active Problem List   Diagnosis Date Noted  . Secondary hyperparathyroidism, renal ([place]) 12/29/2016  . CAD (coronary artery disease), native coronary artery 08/02/2016  . Smoker 03/26/2016  . Hypotension 01/29/2016  . Abnormal myocardial  perfusion study 01/29/2016  . Depression 12/09/2015  . Hyperlipidemia LDL goal <100 06/28/2014  . Type 2 diabetes mellitus with renal manifestations, controlled ([place])   . HTN (hypertension) 06/24/2014  . 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 BPH (benign prostatic hyperplasia) 06/24/2014     Prior to Admission medications   Medication Sig Start Date End Date Taking? Authorizing Provider  amLODipine (NORVASC) 5 MG tablet TAKE 1 TABLET(5 MG) BY MOUTH DAILY 11/17/16  Yes Shah Insley, PA-C  aspirin EC 81 MG EC tablet Take 1 tablet (81 mg total) by mouth daily. 12/03/15  Yes Mayo, Pete Pelt, MD  atorvastatin (LIPITOR) 80 MG tablet Take 1 tablet (80 mg total) by mouth daily. 09/01/16  Yes Scot Jun, FNP  Blood Glucose Monitoring Suppl (ONETOUCH VERIO) w/Device KIT USE UTD 09/03/15  Yes [provider]  Cholecalciferol (VITAMIN D-3 PO) Take 1 tablet by mouth See admin instructions. Three times a week. No specific days   Yes [provider]  citalopram (CELEXA) 20 MG tablet TAKE 1 TABLET BY MOUTH DAILY 01/11/17  Yes Brityn Mastrogiovanni, PA-C  clopidogrel (PLAVIX) 75 MG tablet TAKE 1 TABLET(75 MG) BY MOUTH DAILY 03/16/17  Yes Itha Kroeker, PA-C  cyanocobalamin 500 MCG tablet Take 500 mcg by mouth daily. Reported on 12/02/2015   Yes [provider]  glipiZIDE (GLUCOTROL XL) 5 MG 24 hr tablet TAKE 2 TABLETS BY MOUTH EVERY DAY WITH BREAKFAST 02/09/17  Yes Harrison Mons, PA-C  GlucoCom Lancets MISC Use for home glucose monitoring 09/02/15  Yes Catilyn Boggus, PA-C  glucose blood test strip Test blood sugar once daily. Dx E11.22 03/26/16  Yes Timaya Bojarski, PA-C  metoprolol succinate (  TOPROL-XL) 50 MG 24 hr tablet Take 1 tablet (50 mg total) by mouth daily. Take with or immediately following a meal. 09/01/16  Yes Scot Jun, FNP  nitroGLYCERIN (NITROSTAT) 0.4 MG SL tablet Place 1 tablet (0.4 mg total) under the tongue  every 5 (five) minutes x 3 doses as needed for chest pain. 01/31/16  Yes [name] E, NP  pioglitazone (ACTOS) 15 MG tablet Take 1 tablet (15 mg total) by mouth daily. 12/17/16  Yes Feven Alderfer, PA-C  tamsulosin (FLOMAX) 0.4 MG CAPS capsule Take 1 capsule (0.4 mg total) by mouth daily. 12/14/16  Yes Gyneth Hubka, PA-C     No Known Allergies     Objective:  Physical Exam  Constitutional: He is oriented to person, place, and time. He appears well-developed and well-nourished. He is active and cooperative. No distress.  BP 124/73   Pulse 85   Temp 97.9 F (36.6 C) (Oral)   Resp 16   Ht '5\' 7"'$  (1.702 m)   Wt 147 lb (66.7 kg)   SpO2 99%   BMI 23.02 kg/m   HENT:  Head: Normocephalic and atraumatic.  Right Ear: Hearing normal.  Left Ear: Hearing normal.  Eyes: Conjunctivae are normal. No scleral icterus.  Neck: Normal range of motion. Neck supple. No thyromegaly present.  Cardiovascular: Normal rate, regular rhythm and normal heart sounds.   Pulses:      Radial pulses are 2+ on the right side, and 2+ on the left side.  Pulmonary/Chest: Effort normal and breath sounds normal.  Lymphadenopathy:       Head (right side): No tonsillar, no preauricular, no posterior auricular and no occipital adenopathy present.       Head (left side): No tonsillar, no preauricular, no posterior auricular and no occipital adenopathy present.    He has no cervical adenopathy.       Right: No supraclavicular adenopathy present.       Left: No supraclavicular adenopathy present.  Neurological: He is alert and oriented to person, place, and time. No sensory deficit.  Skin: Skin is warm, dry and intact. No rash noted. No cyanosis or erythema. Nails show no clubbing.  Psychiatric: He has a normal mood and affect. His speech is normal and behavior is normal.   Wt Readings from Last 3 Encounters:  03/19/17 147 lb (66.7 kg)  02/01/17 147 lb (66.7 kg)  12/14/16 141 lb 6.4 oz (64.1 kg)    Results for  orders placed or performed in visit on 03/19/17  POCT glycosylated hemoglobin (Hb A1C)  Result Value Ref Range   Hemoglobin A1C 7.2        Assessment & Plan:   Problem List Items Addressed This Visit    HTN (hypertension) - Primary (Chronic)    Controlled. Continue current treatment.      Relevant Orders   CBC with Differential/Platelet   Comprehensive metabolic panel   TSH   BPH (benign prostatic hyperplasia) (Chronic)    Improved, but he still gets up 3 times each night to urinate. INCREASE tamsulosin to 0.8 mg.      Relevant Medications   tamsulosin (FLOMAX) 0.4 MG CAPS capsule   Type 2 diabetes mellitus with renal manifestations, uncontrolled (HCC) (Chronic)    A1C remains >7%. Continue glipizide xr 10 mg daily (but change from 5 mg to 10 mg tablets). Increase pioglitazone from 15 mg to 30 mg.      Relevant Medications   pioglitazone (ACTOS) 30 MG tablet   glipiZIDE (  GLUCOTROL XL) 10 MG 24 hr tablet   Other Relevant Orders   Comprehensive metabolic panel   HM DIABETES FOOT EXAM (Completed)   POCT glycosylated hemoglobin (Hb A1C) (Completed)   Hyperlipidemia LDL goal <100 (Chronic)    Has been well controlled on atorvastatin 80 mg. No changes today.      Relevant Orders   Comprehensive metabolic panel   Lipid panel   CAD (coronary artery disease), native coronary artery    Continue clopidogrel. Follow-up with cardiology next month as planned.      Relevant Medications   clopidogrel (PLAVIX) 75 MG tablet    Other Visit Diagnoses    Need for influenza vaccination       Relevant Orders   Flu Vaccine QUAD 36+ mos IM (Completed)     Return in about 3 months (around 06/18/2017) for re-evaluation of diabetes.   Fara Chute, PA-C [facility] [place]

## 2017-03-19 NOTE — Assessment & Plan Note (Signed)
A1C remains >7%. Continue glipizide xr 10 mg daily (but change from 5 mg to 10 mg tablets). Increase pioglitazone from 15 mg to 30 mg.

## 2017-03-19 NOTE — Assessment & Plan Note (Signed)
Improved, but he still gets up 3 times each night to urinate. INCREASE tamsulosin to 0.8 mg.

## 2017-03-19 NOTE — Assessment & Plan Note (Signed)
Continue clopidogrel. Follow-up with cardiology next month as planned.

## 2017-03-20 LAB — COMPREHENSIVE METABOLIC PANEL
A/G RATIO: 1.8 (ref 1.2–2.2)
ALBUMIN: 5 g/dL — AB (ref 3.6–4.8)
ALK PHOS: 121 IU/L — AB (ref 39–117)
ALT: 15 IU/L (ref 0–44)
AST: 17 IU/L (ref 0–40)
BUN / CREAT RATIO: 18 (ref 10–24)
BUN: 47 mg/dL — ABNORMAL HIGH (ref 8–27)
Bilirubin Total: <0.2 mg/dL (ref 0.0–1.2)
CO2: 22 mmol/L (ref 20–29)
CREATININE: 2.59 mg/dL — AB (ref 0.76–1.27)
Calcium: 9.9 mg/dL (ref 8.6–10.2)
Chloride: 99 mmol/L (ref 96–106)
GFR calc Af Amer: 28 mL/min/{1.73_m2} — ABNORMAL LOW (ref >59)
GFR calc non Af Amer: 24 mL/min/{1.73_m2} — ABNORMAL LOW (ref >59)
GLOBULIN, TOTAL: 2.8 g/dL (ref 1.5–4.5)
GLUCOSE: 88 mg/dL (ref 65–99)
Potassium: 4.5 mmol/L (ref 3.5–5.2)
SODIUM: 141 mmol/L (ref 134–144)
Total Protein: 7.8 g/dL (ref 6.0–8.5)

## 2017-03-20 LAB — CBC WITH DIFFERENTIAL/PLATELET
BASOS ABS: 0.1 10*3/uL (ref 0.0–0.2)
Basos: 1 %
EOS (ABSOLUTE): 0.1 10*3/uL (ref 0.0–0.4)
Eos: 1 %
HEMOGLOBIN: 10.7 g/dL — AB (ref 13.0–17.7)
Hematocrit: 32.6 % — ABNORMAL LOW (ref 37.5–51.0)
Immature Grans (Abs): 0 10*3/uL (ref 0.0–0.1)
Immature Granulocytes: 0 %
LYMPHS ABS: 0.8 10*3/uL (ref 0.7–3.1)
Lymphs: 8 %
MCH: 27.8 pg (ref 26.6–33.0)
MCHC: 32.8 g/dL (ref 31.5–35.7)
MCV: 85 fL (ref 79–97)
MONOCYTES: 8 %
MONOS ABS: 0.8 10*3/uL (ref 0.1–0.9)
NEUTROS ABS: 7.9 10*3/uL — AB (ref 1.4–7.0)
Neutrophils: 82 %
Platelets: 409 10*3/uL — ABNORMAL HIGH (ref 150–379)
RBC: 3.85 x10E6/uL — AB (ref 4.14–5.80)
RDW: 14.5 % (ref 12.3–15.4)
WBC: 9.6 10*3/uL (ref 3.4–10.8)

## 2017-03-20 LAB — LIPID PANEL
CHOLESTEROL TOTAL: 150 mg/dL (ref 100–199)
Chol/HDL Ratio: 2 ratio (ref 0.0–5.0)
HDL: 74 mg/dL (ref >39)
LDL CALC: 53 mg/dL (ref 0–99)
TRIGLYCERIDES: 116 mg/dL (ref 0–149)
VLDL CHOLESTEROL CAL: 23 mg/dL (ref 5–40)

## 2017-03-20 LAB — TSH: TSH: 2.69 u[IU]/mL (ref 0.450–4.500)

## 2017-04-06 ENCOUNTER — Other Ambulatory Visit: Payer: Self-pay | Admitting: Physician Assistant

## 2017-04-06 DIAGNOSIS — E1122 Type 2 diabetes mellitus with diabetic chronic kidney disease: Secondary | ICD-10-CM

## 2017-04-06 DIAGNOSIS — N183 Chronic kidney disease, stage 3 (moderate): Principal | ICD-10-CM

## 2017-04-15 ENCOUNTER — Other Ambulatory Visit: Payer: Self-pay | Admitting: Physician Assistant

## 2017-05-12 DIAGNOSIS — E1129 Type 2 diabetes mellitus with other diabetic kidney complication: Secondary | ICD-10-CM | POA: Diagnosis not present

## 2017-05-12 DIAGNOSIS — N184 Chronic kidney disease, stage 4 (severe): Secondary | ICD-10-CM | POA: Diagnosis not present

## 2017-05-12 DIAGNOSIS — N400 Enlarged prostate without lower urinary tract symptoms: Secondary | ICD-10-CM | POA: Diagnosis not present

## 2017-05-12 DIAGNOSIS — E1122 Type 2 diabetes mellitus with diabetic chronic kidney disease: Secondary | ICD-10-CM | POA: Diagnosis not present

## 2017-05-12 DIAGNOSIS — E7800 Pure hypercholesterolemia, unspecified: Secondary | ICD-10-CM | POA: Diagnosis not present

## 2017-05-12 DIAGNOSIS — N2581 Secondary hyperparathyroidism of renal origin: Secondary | ICD-10-CM | POA: Diagnosis not present

## 2017-05-12 DIAGNOSIS — I129 Hypertensive chronic kidney disease with stage 1 through stage 4 chronic kidney disease, or unspecified chronic kidney disease: Secondary | ICD-10-CM | POA: Diagnosis not present

## 2017-05-19 DIAGNOSIS — I119 Hypertensive heart disease without heart failure: Secondary | ICD-10-CM | POA: Diagnosis not present

## 2017-05-19 DIAGNOSIS — E7849 Other hyperlipidemia: Secondary | ICD-10-CM | POA: Diagnosis not present

## 2017-05-19 DIAGNOSIS — I2510 Atherosclerotic heart disease of native coronary artery without angina pectoris: Secondary | ICD-10-CM | POA: Diagnosis not present

## 2017-05-19 DIAGNOSIS — N183 Chronic kidney disease, stage 3 (moderate): Secondary | ICD-10-CM | POA: Diagnosis not present

## 2017-05-19 DIAGNOSIS — E1121 Type 2 diabetes mellitus with diabetic nephropathy: Secondary | ICD-10-CM | POA: Diagnosis not present

## 2017-07-16 ENCOUNTER — Other Ambulatory Visit: Payer: Self-pay | Admitting: Physician Assistant

## 2017-07-16 DIAGNOSIS — N183 Chronic kidney disease, stage 3 unspecified: Secondary | ICD-10-CM

## 2017-07-16 DIAGNOSIS — E1122 Type 2 diabetes mellitus with diabetic chronic kidney disease: Secondary | ICD-10-CM

## 2017-08-07 DIAGNOSIS — Z111 Encounter for screening for respiratory tuberculosis: Secondary | ICD-10-CM | POA: Diagnosis not present

## 2017-08-15 DIAGNOSIS — N189 Chronic kidney disease, unspecified: Secondary | ICD-10-CM | POA: Diagnosis not present

## 2017-08-15 DIAGNOSIS — N2581 Secondary hyperparathyroidism of renal origin: Secondary | ICD-10-CM | POA: Diagnosis not present

## 2017-08-15 DIAGNOSIS — I129 Hypertensive chronic kidney disease with stage 1 through stage 4 chronic kidney disease, or unspecified chronic kidney disease: Secondary | ICD-10-CM | POA: Diagnosis not present

## 2017-08-15 DIAGNOSIS — E1129 Type 2 diabetes mellitus with other diabetic kidney complication: Secondary | ICD-10-CM | POA: Diagnosis not present

## 2017-08-15 DIAGNOSIS — N400 Enlarged prostate without lower urinary tract symptoms: Secondary | ICD-10-CM | POA: Diagnosis not present

## 2017-08-15 DIAGNOSIS — D631 Anemia in chronic kidney disease: Secondary | ICD-10-CM | POA: Diagnosis not present

## 2017-08-15 DIAGNOSIS — N184 Chronic kidney disease, stage 4 (severe): Secondary | ICD-10-CM | POA: Diagnosis not present

## 2017-08-15 DIAGNOSIS — E1122 Type 2 diabetes mellitus with diabetic chronic kidney disease: Secondary | ICD-10-CM | POA: Diagnosis not present

## 2017-08-15 DIAGNOSIS — E7800 Pure hypercholesterolemia, unspecified: Secondary | ICD-10-CM | POA: Diagnosis not present

## 2017-08-26 ENCOUNTER — Encounter (HOSPITAL_COMMUNITY): Payer: Medicare Other

## 2017-08-30 ENCOUNTER — Encounter (HOSPITAL_COMMUNITY): Payer: Medicare Other

## 2017-09-03 ENCOUNTER — Other Ambulatory Visit: Payer: Self-pay | Admitting: Family Medicine

## 2017-09-05 ENCOUNTER — Other Ambulatory Visit (HOSPITAL_COMMUNITY): Payer: Self-pay | Admitting: *Deleted

## 2017-09-05 ENCOUNTER — Other Ambulatory Visit: Payer: Self-pay | Admitting: Physician Assistant

## 2017-09-05 NOTE — Telephone Encounter (Signed)
Called p and spoke to his wife due to language barrier. Let her know RX was ready at the pharmacy. She will be calling in the next few days to schedule a follow up for him.  Thanks!

## 2017-09-05 NOTE — Telephone Encounter (Signed)
Called pt - talked to wife due to language barrier- to let him know that his request was filled and available at the pharmacy. I also let his wife know that Harrison Mons has noted that he is due for a check up. She said that she will call in as soon as she gets her new work schedule. She will need to be here with him due to the language barrier.   When she calls in, please schedule him with Harrison Mons for a F/U at his convenience.  Thanks!

## 2017-09-05 NOTE — Telephone Encounter (Signed)
Please call this patient. I have sent the refill for metoprolol. It's unusual that the request was specifically for #30, no refills (I'm happy to order #90, with refills). I note that the patient is due for follow-up.

## 2017-09-06 ENCOUNTER — Ambulatory Visit (HOSPITAL_COMMUNITY)
Admission: RE | Admit: 2017-09-06 | Discharge: 2017-09-06 | Disposition: A | Discharge status: Home / Self Care | Payer: Medicare Other | Source: Ambulatory Visit | Attending: Nephrology | Admitting: Nephrology

## 2017-09-06 ENCOUNTER — Other Ambulatory Visit: Payer: Self-pay | Admitting: Family Medicine

## 2017-09-06 DIAGNOSIS — D631 Anemia in chronic kidney disease: Secondary | ICD-10-CM | POA: Insufficient documentation

## 2017-09-06 DIAGNOSIS — I10 Essential (primary) hypertension: Secondary | ICD-10-CM

## 2017-09-06 DIAGNOSIS — N189 Chronic kidney disease, unspecified: Secondary | ICD-10-CM | POA: Insufficient documentation

## 2017-09-06 MED ORDER — SODIUM CHLORIDE 0.9 % IV SOLN
510.0000 mg | Freq: Once | INTRAVENOUS | Status: AC
  Administered 2017-09-06: 510 mg via INTRAVENOUS
  Filled 2017-09-06: qty 17

## 2017-09-09 ENCOUNTER — Other Ambulatory Visit: Payer: Self-pay | Admitting: Family Medicine

## 2017-10-01 ENCOUNTER — Other Ambulatory Visit: Payer: Self-pay | Admitting: Physician Assistant

## 2017-10-01 DIAGNOSIS — N183 Chronic kidney disease, stage 3 unspecified: Secondary | ICD-10-CM

## 2017-10-01 DIAGNOSIS — N401 Enlarged prostate with lower urinary tract symptoms: Secondary | ICD-10-CM

## 2017-10-01 DIAGNOSIS — R351 Nocturia: Principal | ICD-10-CM

## 2017-10-01 DIAGNOSIS — E1122 Type 2 diabetes mellitus with diabetic chronic kidney disease: Secondary | ICD-10-CM

## 2017-10-02 ENCOUNTER — Other Ambulatory Visit: Payer: Self-pay | Admitting: Physician Assistant

## 2017-10-03 ENCOUNTER — Other Ambulatory Visit: Payer: Self-pay | Admitting: Physician Assistant

## 2017-10-03 DIAGNOSIS — N401 Enlarged prostate with lower urinary tract symptoms: Secondary | ICD-10-CM

## 2017-10-03 DIAGNOSIS — R351 Nocturia: Principal | ICD-10-CM

## 2017-10-04 ENCOUNTER — Other Ambulatory Visit: Payer: Self-pay | Admitting: Physician Assistant

## 2017-10-05 ENCOUNTER — Encounter: Payer: Self-pay | Admitting: Physician Assistant

## 2017-10-05 ENCOUNTER — Other Ambulatory Visit: Payer: Self-pay

## 2017-10-05 ENCOUNTER — Ambulatory Visit (INDEPENDENT_AMBULATORY_CARE_PROVIDER_SITE_OTHER): Payer: Medicare Other | Admitting: Physician Assistant

## 2017-10-05 VITALS — BP 100/56 | HR 95 | Temp 98.5°F | Resp 16 | Ht 67.0 in | Wt 153.2 lb

## 2017-10-05 DIAGNOSIS — E785 Hyperlipidemia, unspecified: Secondary | ICD-10-CM

## 2017-10-05 DIAGNOSIS — F17200 Nicotine dependence, unspecified, uncomplicated: Secondary | ICD-10-CM | POA: Diagnosis not present

## 2017-10-05 DIAGNOSIS — R351 Nocturia: Secondary | ICD-10-CM | POA: Diagnosis not present

## 2017-10-05 DIAGNOSIS — E1165 Type 2 diabetes mellitus with hyperglycemia: Secondary | ICD-10-CM

## 2017-10-05 DIAGNOSIS — N184 Chronic kidney disease, stage 4 (severe): Secondary | ICD-10-CM | POA: Diagnosis not present

## 2017-10-05 DIAGNOSIS — N189 Chronic kidney disease, unspecified: Secondary | ICD-10-CM | POA: Diagnosis not present

## 2017-10-05 DIAGNOSIS — I1310 Hypertensive heart and chronic kidney disease without heart failure, with stage 1 through stage 4 chronic kidney disease, or unspecified chronic kidney disease: Secondary | ICD-10-CM

## 2017-10-05 DIAGNOSIS — E1122 Type 2 diabetes mellitus with diabetic chronic kidney disease: Secondary | ICD-10-CM

## 2017-10-05 DIAGNOSIS — E1129 Type 2 diabetes mellitus with other diabetic kidney complication: Secondary | ICD-10-CM

## 2017-10-05 DIAGNOSIS — N401 Enlarged prostate with lower urinary tract symptoms: Secondary | ICD-10-CM | POA: Diagnosis not present

## 2017-10-05 DIAGNOSIS — N183 Chronic kidney disease, stage 3 unspecified: Secondary | ICD-10-CM

## 2017-10-05 DIAGNOSIS — I10 Essential (primary) hypertension: Secondary | ICD-10-CM | POA: Diagnosis not present

## 2017-10-05 DIAGNOSIS — IMO0002 Reserved for concepts with insufficient information to code with codable children: Secondary | ICD-10-CM

## 2017-10-05 MED ORDER — METOPROLOL SUCCINATE ER 50 MG PO TB24: ORAL_TABLET | ORAL | 3 refills | Status: DC

## 2017-10-05 MED ORDER — GLUCOSE BLOOD VI STRP: ORAL_STRIP | 99 refills | Status: DC

## 2017-10-05 MED ORDER — TAMSULOSIN HCL 0.4 MG PO CAPS: ORAL_CAPSULE | ORAL | 3 refills | Status: DC

## 2017-10-05 NOTE — Progress Notes (Signed)
Patient ID: Adam Reid, male    DOB: [birth year], 72 y.o.   MRN: [id number]  PCP: Harrison Mons, PA-C  Chief Complaint  Patient presents with  . Hypertension    medication follow up   . Diabetes    medication follow up     Subjective:   Presents for evaluation of HTN and diabetes. He is accompanied by his wife, as usual, who translates.  No plans to quit smoking.  Stopped metoprolol about 1 week due to low blood pressure readings. His wife notes he's been more tired, fatigued, and that he has more energy since stopping it. He has not noticed a big difference in how he feels, but the SBP has risen from 90's to 110's.  Otherwise, he is tolerating his medications well.  Home glucose readings 130's-140's. A1C 7.2% in 03/2017  Review of Systems  Constitutional: Negative for activity change, appetite change, fatigue and unexpected weight change.  HENT: Negative for congestion, dental problem, ear pain, hearing loss, mouth sores, postnasal drip, rhinorrhea, sneezing, sore throat, tinnitus and trouble swallowing.   Eyes: Negative for photophobia, pain, redness and visual disturbance.  Respiratory: Negative for cough, chest tightness and shortness of breath.   Cardiovascular: Negative for chest pain, palpitations and leg swelling.  Gastrointestinal: Negative for abdominal pain, blood in stool, constipation, diarrhea, nausea and vomiting.  Endocrine: Negative for cold intolerance, heat intolerance, polydipsia, polyphagia and polyuria.  Genitourinary: Negative for dysuria, frequency, hematuria and urgency.  Musculoskeletal: Negative for arthralgias, gait problem, myalgias and neck stiffness.  Skin: Negative for rash.  Neurological: Negative for dizziness, speech difficulty, weakness, light-headedness, numbness and headaches.  Hematological: Negative for adenopathy.  Psychiatric/Behavioral: Negative for confusion and sleep disturbance. The patient is not nervous/anxious.         Patient Active Problem List   Diagnosis Date Noted  . Secondary hyperparathyroidism, renal ([place]) 12/29/2016  . CAD (coronary artery disease), native coronary artery 08/02/2016  . Smoker 03/26/2016  . Hypotension 01/29/2016  . Abnormal myocardial perfusion study 01/29/2016  . Depression 12/09/2015  . Hyperlipidemia LDL goal <100 06/28/2014  . Diabetes mellitus with renal manifestations, uncontrolled ([place])   . HTN (hypertension) 06/24/2014  . 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 BPH (benign prostatic hyperplasia) 06/24/2014     Prior to Admission medications   Medication Sig Start Date End Date Taking? Authorizing Provider  amLODipine (NORVASC) 5 MG tablet TAKE 1 TABLET(5 MG) BY MOUTH DAILY 11/17/16  Yes Maryana Pittmon, PA-C  aspirin EC 81 MG EC tablet Take 1 tablet (81 mg total) by mouth daily. 12/03/15  Yes Mayo, Pete Pelt, MD  atorvastatin (LIPITOR) 80 MG tablet TAKE 1 TABLET(80 MG) BY MOUTH DAILY 09/09/17  Yes Mat Stuard, PA-C  Blood Glucose Monitoring Suppl (ONETOUCH VERIO) w/Device KIT USE UTD 09/03/15  Yes [provider]  Cholecalciferol (VITAMIN D-3 PO) Take 1 tablet by mouth See admin instructions. Three times a week. No specific days   Yes [provider]  citalopram (CELEXA) 20 MG tablet TAKE 1 TABLET BY MOUTH DAILY 01/11/17  Yes Breeann Reposa, PA-C  clopidogrel (PLAVIX) 75 MG tablet TAKE 1 TABLET(75 MG) BY MOUTH DAILY 03/19/17  Yes Tony Granquist, PA-C  cyanocobalamin 500 MCG tablet Take 500 mcg by mouth daily. Reported on 12/02/2015   Yes [provider]  glipiZIDE (GLUCOTROL XL) 10 MG 24 hr tablet Take 1 tablet (10 mg total) by mouth daily with breakfast. 03/19/17  Yes Harrison Mons, PA-C  GlucoCom Lancets MISC Use for home glucose monitoring 09/02/15  Yes Jowana Thumma, PA-C  nitroGLYCERIN (NITROSTAT) 0.4 MG SL tablet Place 1 tablet (0.4 mg total) under the tongue every 5 (five)  minutes x 3 doses as needed for chest pain. 01/31/16  Yes Arbutus Leas, NP  ONETOUCH VERIO test strip USE ONCE DAILY 10/03/17  Yes Loye Vento, PA-C  pioglitazone (ACTOS) 30 MG tablet Take 1 tablet (30 mg total) by mouth daily. 03/19/17  Yes Flor Houdeshell, PA-C  tamsulosin (FLOMAX) 0.4 MG CAPS capsule TAKE 2 CAPSULES(0.8 MG) BY MOUTH DAILY 10/03/17  Yes Nephi Savage, PA-C  calcitRIOL (ROCALTROL) 0.25 MCG capsule  09/09/17   [provider]  metoprolol succinate (TOPROL-XL) 50 MG 24 hr tablet TAKE 1 TABLET BY MOUTH EVERY DAY WITH OR IMMEDIATELY FOLLOWING A MEAL Patient not taking: Reported on 10/05/2017 09/05/17   Harrison Mons, PA-C  metoprolol succinate (TOPROL-XL) 50 MG 24 hr tablet TAKE 1 TABLET BY MOUTH DAILY WITH OR IMMEDIATELY AFTER A MEAL Patient not taking: Reported on 10/05/2017 10/04/17   Harrison Mons, PA-C     No Known Allergies     Objective:  Physical Exam  Constitutional: He is oriented to person, place, and time. He appears well-developed and well-nourished. He is active and cooperative. No distress.  BP (!) 100/56   Pulse 95   Temp 98.5 F (36.9 C)   Resp 16   Ht '5\' 7"'$  (1.702 m)   Wt 153 lb 3.2 oz (69.5 kg)   SpO2 97%   BMI 23.99 kg/m   HENT:  Head: Normocephalic and atraumatic.  Right Ear: Hearing normal.  Left Ear: Hearing normal.  Eyes: Conjunctivae are normal. No scleral icterus.  Neck: Normal range of motion. Neck supple. No thyromegaly present.  Cardiovascular: Normal rate, regular rhythm and normal heart sounds.  Pulses:      Radial pulses are 2+ on the right side, and 2+ on the left side.  Pulmonary/Chest: Effort normal and breath sounds normal.  Lymphadenopathy:       Head (right side): No tonsillar, no preauricular, no posterior auricular and no occipital adenopathy present.       Head (left side): No tonsillar, no preauricular, no posterior auricular and no occipital adenopathy present.    He has no cervical adenopathy.       Right: No  supraclavicular adenopathy present.       Left: No supraclavicular adenopathy present.  Neurological: He is alert and oriented to person, place, and time. No sensory deficit.  Skin: Skin is warm, dry and intact. No rash noted. No cyanosis or erythema. Nails show no clubbing.  Psychiatric: He has a normal mood and affect. His speech is normal and behavior is normal.   Wt Readings from Last 3 Encounters:  10/05/17 153 lb 3.2 oz (69.5 kg)  09/06/17 156 lb (70.8 kg)  03/19/17 147 lb (66.7 kg)      Assessment & Plan:   Problem List Items Addressed This Visit    HTN (hypertension) (Chronic)    Well/over controlled. Given his heart disease, recommend that he resume the metoprolol and stop the amlodipine. COntinue to monitor home BP.      Relevant Medications   metoprolol succinate (TOPROL-XL) 50 MG 24 hr tablet   BPH (benign prostatic hyperplasia) (Chronic)    Stable. No changes.      Relevant Medications   tamsulosin (FLOMAX) 0.4 MG CAPS capsule   Hyperlipidemia LDL goal <100 (Chronic)  Await labs. Adjust regimen as indicated by results. WOuld need additional agent if LDL not to goal.       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 Continue monitoring at home.      Relevant Medications   metoprolol succinate (TOPROL-XL) 50 MG 24 hr tablet   Other Relevant Orders   CBC with Differential/Platelet (Completed)   Comprehensive metabolic panel (Completed)   TSH (Completed)   Diabetes mellitus with renal manifestations, controlled ([place]) - Primary    HOme readings are appropriate. Await A1C. If >7.5%, would add additional agent.      Relevant Orders   Comprehensive metabolic panel (Completed)    Hemoglobin A1c (Completed)   Lipid panel (Completed)   Smoker    No plans to quit smoking.       Other Visit Diagnoses    Controlled type 2 diabetes mellitus with stage 3 chronic kidney disease ([place])       Relevant Medications   glucose blood (ONETOUCH VERIO) test strip   Chronic kidney disease           Return in about 5 months (around 03/07/2018) for re-evalaution of diabetes, blood pressure, cholesterol, etc.   Fara Chute, PA-C [facility] [place]

## 2017-10-05 NOTE — Patient Instructions (Signed)
     IF you received an x-ray today, you will receive an invoice from [facility]. Please contact [facility] at [phone] with questions or concerns regarding your invoice.   IF you received labwork today, you will receive an invoice from [facility]. Please contact [facility] at [phone] with questions or concerns regarding your invoice.   Our billing staff will not be able to assist you with questions regarding bills from these companies.  You will be contacted with the lab results as soon as they are available. The fastest way to get your results is to activate your My Chart account. Instructions are located on the last page of this paperwork. If you have not heard from us regarding the results in 2 weeks, please contact this office.     

## 2017-10-06 LAB — CBC WITH DIFFERENTIAL/PLATELET
BASOS: 1 %
Basophils Absolute: 0 10*3/uL (ref 0.0–0.2)
EOS (ABSOLUTE): 0.1 10*3/uL (ref 0.0–0.4)
Eos: 2 %
Hematocrit: 33.7 % — ABNORMAL LOW (ref 37.5–51.0)
Hemoglobin: 10.8 g/dL — ABNORMAL LOW (ref 13.0–17.7)
IMMATURE GRANULOCYTES: 0 %
Immature Grans (Abs): 0 10*3/uL (ref 0.0–0.1)
LYMPHS: 8 %
Lymphocytes Absolute: 0.6 10*3/uL — ABNORMAL LOW (ref 0.7–3.1)
MCH: 26.3 pg — AB (ref 26.6–33.0)
MCHC: 32 g/dL (ref 31.5–35.7)
MCV: 82 fL (ref 79–97)
Monocytes Absolute: 0.4 10*3/uL (ref 0.1–0.9)
Monocytes: 5 %
NEUTROS PCT: 84 %
Neutrophils Absolute: 6.3 10*3/uL (ref 1.4–7.0)
PLATELETS: 342 10*3/uL (ref 150–379)
RBC: 4.1 x10E6/uL — ABNORMAL LOW (ref 4.14–5.80)
RDW: 17.4 % — AB (ref 12.3–15.4)
WBC: 7.4 10*3/uL (ref 3.4–10.8)

## 2017-10-06 LAB — COMPREHENSIVE METABOLIC PANEL
A/G RATIO: 1.8 (ref 1.2–2.2)
ALT: 13 IU/L (ref 0–44)
AST: 13 IU/L (ref 0–40)
Albumin: 4.6 g/dL (ref 3.5–4.8)
Alkaline Phosphatase: 111 IU/L (ref 39–117)
BILIRUBIN TOTAL: 0.2 mg/dL (ref 0.0–1.2)
BUN/Creatinine Ratio: 21 (ref 10–24)
BUN: 41 mg/dL — AB (ref 8–27)
CALCIUM: 9.7 mg/dL (ref 8.6–10.2)
CO2: 24 mmol/L (ref 20–29)
Chloride: 97 mmol/L (ref 96–106)
Creatinine, Ser: 1.98 mg/dL — ABNORMAL HIGH (ref 0.76–1.27)
GFR calc Af Amer: 38 mL/min/{1.73_m2} — ABNORMAL LOW (ref >59)
GFR calc non Af Amer: 33 mL/min/{1.73_m2} — ABNORMAL LOW (ref >59)
Globulin, Total: 2.6 g/dL (ref 1.5–4.5)
Glucose: 272 mg/dL — ABNORMAL HIGH (ref 65–99)
POTASSIUM: 4.1 mmol/L (ref 3.5–5.2)
Sodium: 138 mmol/L (ref 134–144)
Total Protein: 7.2 g/dL (ref 6.0–8.5)

## 2017-10-06 LAB — TSH: TSH: 3.58 u[IU]/mL (ref 0.450–4.500)

## 2017-10-06 LAB — LIPID PANEL
CHOL/HDL RATIO: 2.1 ratio (ref 0.0–5.0)
Cholesterol, Total: 160 mg/dL (ref 100–199)
HDL: 78 mg/dL (ref >39)
LDL CALC: 53 mg/dL (ref 0–99)
Triglycerides: 145 mg/dL (ref 0–149)
VLDL Cholesterol Cal: 29 mg/dL (ref 5–40)

## 2017-10-06 LAB — HEMOGLOBIN A1C
Est. average glucose Bld gHb Est-mCnc: 151 mg/dL
HEMOGLOBIN A1C: 6.9 % — AB (ref 4.8–5.6)

## 2017-10-06 NOTE — Assessment & Plan Note (Signed)
HOme readings are appropriate. Await A1C. If >7.5%, would add additional agent.

## 2017-10-06 NOTE — Assessment & Plan Note (Signed)
Well/over controlled. Given his heart disease, recommend that he resume the metoprolol and stop the amlodipine. COntinue to monitor home BP.

## 2017-10-06 NOTE — Assessment & Plan Note (Signed)
Await labs. Adjust regimen as indicated by results. WOuld need additional agent if LDL not to goal.

## 2017-10-06 NOTE — Assessment & Plan Note (Signed)
No plans to quit smoking.

## 2017-10-06 NOTE — Assessment & Plan Note (Signed)
Resume metoprolol and stop amlodipine, due to symptomatic hypotension. Continue monitoring at home.

## 2017-10-06 NOTE — Assessment & Plan Note (Signed)
Stable No changes 

## 2017-10-17 ENCOUNTER — Encounter: Payer: Self-pay | Admitting: Physician Assistant

## 2017-10-19 ENCOUNTER — Encounter: Payer: Self-pay | Admitting: Physician Assistant

## 2017-10-20 ENCOUNTER — Telehealth: Payer: Self-pay

## 2017-10-20 NOTE — Telephone Encounter (Signed)
Copied from [place]. Topic: Quick Communication - Lab Results >> Oct 20, 2017 11:14 AM Oliver Pila B wrote: Pt called to have the test results reviewed w/ someone, call pt to advise

## 2017-10-24 NOTE — Telephone Encounter (Signed)
Result note sent to patient in My Chart. If he still has questions, please inquire what they are specifically.

## 2017-10-25 ENCOUNTER — Ambulatory Visit (INDEPENDENT_AMBULATORY_CARE_PROVIDER_SITE_OTHER): Payer: Medicare Other | Admitting: Family Medicine

## 2017-10-25 ENCOUNTER — Encounter: Payer: Self-pay | Admitting: Family Medicine

## 2017-10-25 ENCOUNTER — Ambulatory Visit: Payer: Self-pay

## 2017-10-25 ENCOUNTER — Other Ambulatory Visit: Payer: Self-pay

## 2017-10-25 VITALS — BP 118/60 | HR 100 | Temp 98.2°F | Ht 67.52 in | Wt 153.4 lb

## 2017-10-25 DIAGNOSIS — R42 Dizziness and giddiness: Secondary | ICD-10-CM | POA: Diagnosis not present

## 2017-10-25 NOTE — Patient Instructions (Addendum)
1. Decrease metoprolol to 25mg  (1/2 tablet once a day) 2. Call Dr Ethelene Browns office and ask about plavix 3. Please bring your BP cuff at next visit   IF you received an x-ray today, you will receive an invoice from [facility]. Please contact [facility] at [phone] with questions or concerns regarding your invoice.   IF you received labwork today, you will receive an invoice from [facility]. Please contact [facility] at [phone] with questions or concerns regarding your invoice.   Our billing staff will not be able to assist you with questions regarding bills from these companies.  You will be contacted with the lab results as soon as they are available. The fastest way to get your results is to activate your My Chart account. Instructions are located on the last page of this paperwork. If you have not heard from [place] regarding the results in 2 weeks, please contact this office.

## 2017-10-25 NOTE — Telephone Encounter (Signed)
Wife reports pt. Seen recently and "his Norvasc was stopped. " Reports he is still having some low BP readings. Last night it was 101/48.Had some dizziness at the gym yesterday. Appointment made for today.  Reason for Disposition . [6] Systolic BP 43-142 AND [7] taking blood pressure medications AND [3] NOT dizzy, lightheaded or weak  Answer Assessment - Initial Assessment Questions 1. BLOOD PRESSURE: "What is the blood pressure?" "Did you take at least two measurements 5 minutes apart?"     101/48 last night 2. ONSET: "When did you take your blood pressure?"     Last night 3. HOW: "How did you obtain the blood pressure?" (e.g., visiting nurse, automatic home BP monitor)     Home BP MONITOR 4. HISTORY: "Do you have a history of low blood pressure?" "What is your blood pressure normally?"     NO 5. MEDICATIONS: "Are you taking any medications for blood pressure?" If yes: "Have they been changed recently?"     Yes 6. PULSE RATE: "Do you know what your pulse rate is?"      No problems 7. OTHER SYMPTOMS: "Have you been sick recently?" "Have you had a recent injury?"     No 8. PREGNANCY: "Is there any chance you are pregnant?" "When was your last menstrual period?"     N/a  Protocols used: LOW BLOOD PRESSURE-A-AH

## 2017-10-25 NOTE — Progress Notes (Signed)
4/16/20193:14 PM  Adam Reid Feb 16, 1946, 72 y.o. male [id number]  Chief Complaint  Patient presents with  . Dizziness    after working out yesterday, became dizzy and began to faint has to be assisted by family member. Takes his bp at home using the wrist cuff. Gives very low readings. PCP took him off of some bp meds already due to stable readings per wife    HPI:   Patient is a 72 y.o. male with past medical history significant for CAD, DM2, CKD who presents today for feeling dizzy yesterday while he was at the gym. He was doing a chest press machine, when he stood up and started walking towards his brother-in law Felt dizzy, like on a boat, felt like he was going to pass out. Symptoms resolved after resting for several minutes When he arrived home his BP was 80/50s He had eaten and was keeping hydrated Denies any CP, SOB, nausea, palpitation, diaphoresis He exercises regularly at the gym and had no previous issues H/o syncope before, due to orthostatic, amlodipine stopped at that time Cards - Dr Fidela [place], last appt 4-5 months ago, everything was ok, fu 1 year  Labs 09/2017: crt 1.98, GFR 33, slightly improved, hgb 10,8, stable, a1c 6.9 Left heart cath 01/2016 - normal LVEF, 20% stenosis of Ramus and LAD, 50% Diag  Depression screen [facility] 2/9 10/25/2017 03/19/2017 03/19/2017  Decreased Interest 0 0 0  Down, Depressed, Hopeless 0 0 0  PHQ - 2 Score 0 0 0  Altered sleeping - - -  Tired, decreased energy - - -  Change in appetite - - -  Feeling bad or failure about yourself  - - -  Trouble concentrating - - -  Moving slowly or fidgety/restless - - -  Suicidal thoughts - - -  PHQ-9 Score - - -  Difficult doing work/chores - - -    No Known Allergies  Prior to Admission medications   Medication Sig Start Date End Date Taking? Authorizing Provider  aspirin EC 81 MG EC tablet Take 1 tablet (81 mg total) by mouth daily. 12/03/15  Yes Mayo, Pete Pelt, MD  atorvastatin (LIPITOR) 80  MG tablet TAKE 1 TABLET(80 MG) BY MOUTH DAILY 09/09/17  Yes Jeffery, Chelle, PA-C  Blood Glucose Monitoring Suppl (ONETOUCH VERIO) w/Device KIT USE UTD 09/03/15  Yes [provider]  calcitRIOL (ROCALTROL) 0.25 MCG capsule  09/09/17  Yes [provider]  Cholecalciferol (VITAMIN D-3 PO) Take 1 tablet by mouth See admin instructions. Three times a week. No specific days   Yes [provider]  citalopram (CELEXA) 20 MG tablet TAKE 1 TABLET BY MOUTH DAILY 01/11/17  Yes Jeffery, Chelle, PA-C  clopidogrel (PLAVIX) 75 MG tablet TAKE 1 TABLET(75 MG) BY MOUTH DAILY 03/19/17  Yes Jeffery, Chelle, PA-C  cyanocobalamin 500 MCG tablet Take 500 mcg by mouth daily. Reported on 12/02/2015   Yes [provider]  glipiZIDE (GLUCOTROL XL) 10 MG 24 hr tablet Take 1 tablet (10 mg total) by mouth daily with breakfast. 03/19/17  Yes Harrison Mons, PA-C  GlucoCom Lancets MISC Use for home glucose monitoring 09/02/15  Yes Jeffery, Chelle, PA-C  glucose blood (ONETOUCH VERIO) test strip USE ONCE DAILY 10/05/17  Yes Jeffery, Chelle, PA-C  metoprolol succinate (TOPROL-XL) 50 MG 24 hr tablet TAKE 1 TABLET BY MOUTH EVERY DAY WITH OR IMMEDIATELY FOLLOWING A MEAL 10/05/17  Yes Jeffery, Chelle, PA-C  nitroGLYCERIN (NITROSTAT) 0.4 MG SL tablet Place 1 tablet (0.4 mg total) under  the tongue every 5 (five) minutes x 3 doses as needed for chest pain. 01/31/16  Yes [name] E, NP  pioglitazone (ACTOS) 30 MG tablet Take 1 tablet (30 mg total) by mouth daily. 03/19/17  Yes Jeffery, Chelle, PA-C  tamsulosin (FLOMAX) 0.4 MG CAPS capsule TAKE 2 CAPSULES(0.8 MG) BY MOUTH DAILY 10/05/17  Yes Harrison Mons, PA-C    Past Medical History:  Diagnosis Date  . Chronic kidney disease   . Diabetes mellitus without complication ([place])   . Hyperlipidemia   . Hypertension   . Thyroid disease    was on supplement, taken off by [name]    Past Surgical History:  Procedure Laterality Date  . APPENDECTOMY    . CARDIAC  CATHETERIZATION N/A 01/30/2016   Procedure: Left Heart Cath and Coronary Angiography;  Surgeon: [name], MD;  Location: [facility];  Service: Cardiovascular;  Laterality: N/A;    Social History   Tobacco Use  . Smoking status: Current Every Day Smoker    Packs/day: 0.25    Years: 48.00    Pack years: 12.00    Types: Cigarettes  . Smokeless tobacco: Never Used  . Tobacco comment: 09/07/16 3-4 cigs daily, trying to cut back  Substance Use Topics  . Alcohol use: No    Alcohol/week: 0.0 oz    Family History  Problem Relation Age of Onset  . Hypertension Mother   . Hyperlipidemia Mother   . Hypertension Sister   . Kidney disease Father   . Heart disease Brother 28       open heart surgery  . Colon cancer Neg Hx     Review of Systems  Constitutional: Negative for chills and fever.  Respiratory: Negative for cough and shortness of breath.   Cardiovascular: Negative for chest pain, palpitations and leg swelling.  Gastrointestinal: Negative for abdominal pain, nausea and vomiting.  Neurological: Positive for dizziness. Negative for loss of consciousness.     OBJECTIVE:  Blood pressure 118/60, pulse 100, temperature 98.2 F (36.8 C), temperature source Oral, height 5' 7.52" (1.715 m), weight 153 lb 6.4 oz (69.6 kg), SpO2 98 %.  Orthostatics Lying down: 160/78, 98 Standing @ 1 min: 110/58, 98 Standing @ 27mn: 120/72, 99  Physical Exam  Constitutional: He is oriented to person, place, and time. He appears well-developed and well-nourished.  HENT:  Head: Normocephalic and atraumatic.  Mouth/Throat: Oropharynx is clear and moist.  Eyes: Pupils are equal, round, and reactive to light. Conjunctivae and EOM are normal.  Neck: Neck supple. No thyromegaly present.  Cardiovascular: Normal rate and regular rhythm. Exam reveals no gallop and no friction rub.  No murmur heard. Pulmonary/Chest: Effort normal and breath sounds normal. He has no wheezes. He has no  rales.  Musculoskeletal: He exhibits no edema.  Neurological: He is alert and oriented to person, place, and time. He displays normal reflexes. No cranial nerve deficit. Coordination normal.  Skin: Skin is warm and dry.  Nursing note and vitals reviewed.  My interpretation of EKG:  NSR, HR 80, no st changes, normal intervals  ASSESSMENT and PLAN  1. Dizziness Seems related to orthostatics, discussed routine precautions, pushing fluids, recent cards eval OK. Discussed decreasing metoprolol, cont checking BP at home. ER precautions given. - Orthostatic vital signs - EKG 12-Lead - CBC - Comprehensive metabolic panel decrease metoprolol to 1/2 tab (263m once a day. Bring home BP cuff at next visit.  Return in about 2 weeks (around 11/08/2017).  Irma M Santiago, MD [facility] [address] [place], [place] [address] Ph.  [phone] Fax [phone]   

## 2017-10-26 LAB — COMPREHENSIVE METABOLIC PANEL
ALT: 10 IU/L (ref 0–44)
AST: 11 IU/L (ref 0–40)
Albumin/Globulin Ratio: 1.9 (ref 1.2–2.2)
Albumin: 4.8 g/dL (ref 3.5–4.8)
Alkaline Phosphatase: 115 IU/L (ref 39–117)
BUN/Creatinine Ratio: 21 (ref 10–24)
BUN: 53 mg/dL — ABNORMAL HIGH (ref 8–27)
Bilirubin Total: <0.2 mg/dL (ref 0.0–1.2)
CO2: 22 mmol/L (ref 20–29)
Calcium: 9.5 mg/dL (ref 8.6–10.2)
Chloride: 103 mmol/L (ref 96–106)
Creatinine, Ser: 2.56 mg/dL — ABNORMAL HIGH (ref 0.76–1.27)
GFR calc Af Amer: 28 mL/min/{1.73_m2} — ABNORMAL LOW (ref >59)
GFR calc non Af Amer: 24 mL/min/{1.73_m2} — ABNORMAL LOW (ref >59)
Globulin, Total: 2.5 g/dL (ref 1.5–4.5)
Glucose: 81 mg/dL (ref 65–99)
Potassium: 4.6 mmol/L (ref 3.5–5.2)
Sodium: 142 mmol/L (ref 134–144)
Total Protein: 7.3 g/dL (ref 6.0–8.5)

## 2017-10-26 LAB — CBC
Hematocrit: 34.2 % — ABNORMAL LOW (ref 37.5–51.0)
Hemoglobin: 11.3 g/dL — ABNORMAL LOW (ref 13.0–17.7)
MCH: 27 pg (ref 26.6–33.0)
MCHC: 33 g/dL (ref 31.5–35.7)
MCV: 82 fL (ref 79–97)
Platelets: 327 10*3/uL (ref 150–379)
RBC: 4.19 x10E6/uL (ref 4.14–5.80)
RDW: 17.1 % — ABNORMAL HIGH (ref 12.3–15.4)
WBC: 8.7 10*3/uL (ref 3.4–10.8)

## 2017-11-10 ENCOUNTER — Ambulatory Visit: Payer: Medicare Other | Admitting: Family Medicine

## 2017-11-10 DIAGNOSIS — N040 Nephrotic syndrome with minor glomerular abnormality: Secondary | ICD-10-CM | POA: Diagnosis not present

## 2017-11-10 DIAGNOSIS — E1122 Type 2 diabetes mellitus with diabetic chronic kidney disease: Secondary | ICD-10-CM | POA: Diagnosis not present

## 2017-11-10 DIAGNOSIS — I129 Hypertensive chronic kidney disease with stage 1 through stage 4 chronic kidney disease, or unspecified chronic kidney disease: Secondary | ICD-10-CM | POA: Diagnosis not present

## 2017-11-10 DIAGNOSIS — N189 Chronic kidney disease, unspecified: Secondary | ICD-10-CM | POA: Diagnosis not present

## 2017-11-10 DIAGNOSIS — N184 Chronic kidney disease, stage 4 (severe): Secondary | ICD-10-CM | POA: Diagnosis not present

## 2017-11-10 DIAGNOSIS — E7800 Pure hypercholesterolemia, unspecified: Secondary | ICD-10-CM | POA: Diagnosis not present

## 2017-11-10 DIAGNOSIS — D631 Anemia in chronic kidney disease: Secondary | ICD-10-CM | POA: Diagnosis not present

## 2017-11-10 DIAGNOSIS — N2581 Secondary hyperparathyroidism of renal origin: Secondary | ICD-10-CM | POA: Diagnosis not present

## 2017-11-14 DIAGNOSIS — I119 Hypertensive heart disease without heart failure: Secondary | ICD-10-CM | POA: Diagnosis not present

## 2017-11-25 ENCOUNTER — Other Ambulatory Visit: Payer: Self-pay | Admitting: Physician Assistant

## 2017-11-26 ENCOUNTER — Other Ambulatory Visit: Payer: Self-pay | Admitting: Physician Assistant

## 2017-11-26 DIAGNOSIS — N183 Chronic kidney disease, stage 3 unspecified: Secondary | ICD-10-CM

## 2017-11-26 DIAGNOSIS — E1122 Type 2 diabetes mellitus with diabetic chronic kidney disease: Secondary | ICD-10-CM

## 2017-12-02 ENCOUNTER — Other Ambulatory Visit: Payer: Self-pay | Admitting: Physician Assistant

## 2017-12-02 DIAGNOSIS — I10 Essential (primary) hypertension: Secondary | ICD-10-CM

## 2017-12-02 DIAGNOSIS — N189 Chronic kidney disease, unspecified: Secondary | ICD-10-CM

## 2017-12-28 ENCOUNTER — Other Ambulatory Visit: Payer: Self-pay | Admitting: Physician Assistant

## 2017-12-28 DIAGNOSIS — F329 Major depressive disorder, single episode, unspecified: Secondary | ICD-10-CM

## 2017-12-28 DIAGNOSIS — F32A Depression, unspecified: Secondary | ICD-10-CM

## 2018-01-23 ENCOUNTER — Encounter: Payer: Self-pay | Admitting: Family Medicine

## 2018-01-23 ENCOUNTER — Ambulatory Visit (INDEPENDENT_AMBULATORY_CARE_PROVIDER_SITE_OTHER): Payer: Medicare Other | Admitting: Family Medicine

## 2018-01-23 ENCOUNTER — Other Ambulatory Visit: Payer: Self-pay

## 2018-01-23 VITALS — BP 114/68 | HR 93 | Temp 98.4°F | Ht 61.0 in | Wt 150.2 lb

## 2018-01-23 DIAGNOSIS — I10 Essential (primary) hypertension: Secondary | ICD-10-CM

## 2018-01-23 DIAGNOSIS — E1122 Type 2 diabetes mellitus with diabetic chronic kidney disease: Secondary | ICD-10-CM | POA: Diagnosis not present

## 2018-01-23 DIAGNOSIS — N189 Chronic kidney disease, unspecified: Secondary | ICD-10-CM

## 2018-01-23 DIAGNOSIS — N183 Chronic kidney disease, stage 3 (moderate): Secondary | ICD-10-CM

## 2018-01-23 DIAGNOSIS — E785 Hyperlipidemia, unspecified: Secondary | ICD-10-CM

## 2018-01-23 DIAGNOSIS — I129 Hypertensive chronic kidney disease with stage 1 through stage 4 chronic kidney disease, or unspecified chronic kidney disease: Secondary | ICD-10-CM

## 2018-01-23 NOTE — Patient Instructions (Addendum)
No change in meds today .  I will recheck A1c and kidney test today.  Follow up in 3 months for diabetes and can recheck cholesterol levels at that time.  Thank you for coming in today.    Type 2 Diabetes Mellitus, Self Care, Adult When you have type 2 diabetes (type 2 diabetes mellitus), you must keep your blood sugar (glucose) under control. You can do this with:  Nutrition.  Exercise.  Lifestyle changes.  Medicines or insulin, if needed.  Support from your doctors and others.  How do I manage my blood sugar?  Check your blood sugar level every day, as often as told.  Call your doctor if your blood sugar is above your goal numbers for 2 tests in a row.  Have your A1c (hemoglobin A1c) level checked at least two times a year. Have it checked more often if your doctor tells you to. Your doctor will set treatment goals for you. Generally, you should have these blood sugar levels:  Before meals (preprandial): 80-130 mg/dL (4.4-7.2 mmol/L).  After meals (postprandial): lower than 180 mg/dL (10 mmol/L).  A1c level: less than 7%.  What do I need to know about high blood sugar? High blood sugar is called hyperglycemia. Know the signs of high blood sugar. Signs may include:  Feeling: ? Thirsty. ? Hungry. ? Very tired.  Needing to pee (urinate) more than usual.  Blurry vision.  What do I need to know about low blood sugar? Low blood sugar is called hypoglycemia. This is when blood sugar is at or below 70 mg/dL (3.9 mmol/L). Symptoms may include:  Feeling: ? Hungry. ? Worried or nervous (anxious). ? Sweaty and clammy. ? Confused. ? Dizzy. ? Sleepy. ? Sick to your stomach (nauseous).  Having: ? A fast heartbeat (palpitations). ? A headache. ? A change in your vision. ? Jerky movements that you cannot control (seizure). ? Nightmares. ? Tingling or no feeling (numbness) around the mouth, lips, or tongue.  Having trouble with: ? Talking. ? Paying attention  (concentrating). ? Moving (coordination). ? Sleeping.  Shaking.  Passing out (fainting).  Getting upset easily (irritability).  Treating low blood sugar  To treat low blood sugar, eat or drink something sugary right away. If you can think clearly and swallow safely, follow the 15:15 rule:  Take 15 grams of a fast-acting carb (carbohydrate). Some fast-acting carbs are: ? 1 tube of glucose gel. ? 3 sugar tablets (glucose pills). ? 6-8 pieces of hard candy. ? 4 oz (120 mL) of fruit juice. ? 4 oz (120 mL) regular (not diet) soda.  Check your blood sugar 15 minutes after you take the carb.  If your blood sugar is still at or below 70 mg/dL (3.9 mmol/L), take 15 grams of a carb again.  If your blood sugar does not go above 70 mg/dL (3.9 mmol/L) after 3 tries, get help right away.  After your blood sugar goes back to normal, eat a meal or a snack within 1 hour.  Treating very low blood sugar If your blood sugar is at or below 54 mg/dL (3 mmol/L), you have very low blood sugar (severe hypoglycemia). This is an emergency. Do not wait to see if the symptoms will go away. Get medical help right away. Call your local emergency services (911 in the [place] Do not drive yourself to the hospital. If you have very low blood sugar and you cannot eat or drink, you may need a glucagon shot (injection). A family member  or friend should learn how to check your blood sugar and how to give you a glucagon shot. Ask your doctor if you need to have a glucagon shot kit at home. What else is important to manage my diabetes? Medicine Follow these instructions about insulin and diabetes medicines:  Take them as told by your doctor.  Adjust them as told by your doctor.  Do not run out of them.  Having diabetes can raise your risk for other long-term conditions. These include heart or kidney disease. Your doctor may prescribe medicines to help prevent problems from diabetes. Food   Make healthy food  choices. These include: ? Chicken, fish, egg whites, and beans. ? Oats, whole wheat, bulgur, brown rice, quinoa, and millet. ? Fresh fruits and vegetables. ? Low-fat dairy products. ? Nuts, avocado, olive oil, and canola oil.  Make a food plan with a specialist (dietitian).  Follow instructions from your doctor about what you cannot eat or drink.  Drink enough fluid to keep your pee (urine) clear or pale yellow.  Eat healthy snacks between healthy meals.  Keep track of carbs that you eat. Read food labels. Learn food serving sizes.  Follow your sick day plan when you cannot eat or drink normally. Make this plan with your doctor so it is ready to use. Activity  Exercise at least 3 times a week.  Do not go more than 2 days without exercising.  Talk with your doctor before you start a new exercise. Your doctor may need to adjust your insulin, medicines, or food. Lifestyle   Do not use any tobacco products. These include cigarettes, chewing tobacco, and e-cigarettes.If you need help quitting, ask your doctor.  Ask your doctor how much alcohol is safe for you.  Learn to deal with stress. If you need help with this, ask your doctor. Body care  Stay up to date with your shots (immunizations).  Have your eyes and feet checked by a doctor as often as told.  Check your skin and feet every day. Check for cuts, bruises, redness, blisters, or sores.  Brush your teeth and gums two times a day.  Floss at least one time a day.  Go to the dentist least one time every 6 months.  Stay at a healthy weight. General instructions   Take over-the-counter and prescription medicines only as told by your doctor.  Share your diabetes care plan with: ? Your work or school. ? People you live with.  Check your pee (urine) for ketones: ? When you are sick. ? As told by your doctor.  Carry a card or wear jewelry that says that you have diabetes.  Ask your doctor: ? Do I need to meet  with a diabetes educator? ? Where can I find a support group for people with diabetes?  Keep all follow-up visits as told by your doctor. This is important. Where to find more information: To learn more about diabetes, visit:  American Diabetes Association: www.diabetes.org  American Association of Diabetes Educators: www.diabeteseducator.org/patient-resources  This information is not intended to replace advice given to you by your health care provider. Make sure you discuss any questions you have with your health care provider. Document Released: 10/20/2015 Document Revised: 12/04/2015 Document Reviewed: 08/01/2015 Elsevier Interactive Patient Education  2018 [employer].    IF you received an x-ray today, you will receive an invoice from [facility]. Please contact [facility] at [phone] with questions or concerns regarding your invoice.   IF you received  labwork today, you will receive an invoice from [facility]. Please contact [facility] at [phone] with questions or concerns regarding your invoice.   Our billing staff will not be able to assist you with questions regarding bills from these companies.  You will be contacted with the lab results as soon as they are available. The fastest way to get your results is to activate your My Chart account. Instructions are located on the last page of this paperwork. If you have not heard from [place] regarding the results in 2 weeks, please contact this office.

## 2018-01-23 NOTE — Progress Notes (Signed)
Subjective:  By signing my name below, I, Adam Reid, attest that this documentation has been prepared under the direction and in the presence of Adam Ray, MD. Electronically Signed: [name], [place]. 01/23/2018 , 1:54 PM .  Patient was seen in Room 10 .   Patient ID: Adam Reid, male    DOB: 10/19/1945, 72 y.o.   MRN: [id number] Chief Complaint  Patient presents with  . Diabetes    f/u   . Hyperlipidemia    f/u  (pt is fastinf today)   HPI Adam Reid is a 72 y.o. male Here for follow up of diabetes. He is a new patient to me, other than acute issue in 2017. His PCP has been [facility]; last office visit with Adam in March.   His primary language is [place], but can understand some [place]. He is brought with his wife, who translated for him in the room.   Diabetes associated with stage 3 CKD  Lab Results  Component Value Date   HGBA1C 6.9 (H) 10/05/2017   His home readings in March were running 130s-140s.   Urine micro albumin: due for this.  Optho: 02/28/17 Foot exam: 03/19/17  He's continued on glipizide 10 mg qd and Actos 30 mg qd.   Lab Results  Component Value Date   CREATININE 2.56 (H) 10/25/2017   His creatinine was increased from 1.98 previously. He was seen in April for dizziness at that time. His metoprolol was decreased to half tablet at that time. He's been taking half tablet of metoprolol. He denies chest pain, shortness of breath, lightheadedness, dizziness or headaches.   Thyroid He mentions having thyroid problems in the past. He was previously on thyroid medications. But when his thyroid was checked 10 months and 3 months ago, it was normal.   Hyperlipidemia Lab Results  Component Value Date   CHOL 160 10/05/2017   HDL 78 10/05/2017   LDLCALC 53 10/05/2017   TRIG 145 10/05/2017   CHOLHDL 2.1 10/05/2017    Lab Results  Component Value Date   ALT 10 10/25/2017   AST 11 10/25/2017   ALKPHOS 115 10/25/2017   BILITOT  <0.2 10/25/2017   Stable control in March with LDL under 70; continued on Lipitor 80 mg qd.   Patient Active Problem List   Diagnosis Date Noted  . Secondary hyperparathyroidism, renal ([place]) 12/29/2016  . CAD (coronary artery disease), native coronary artery 08/02/2016  . Smoker 03/26/2016  . Hypotension 01/29/2016  . Abnormal myocardial perfusion study 01/29/2016  . Depression 12/09/2015  . Hyperlipidemia LDL goal <100 06/28/2014  . Diabetes mellitus with renal manifestations, controlled ([place])   . HTN (hypertension) 06/24/2014  . 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 BPH (benign prostatic hyperplasia) 06/24/2014   Past Medical History:  Diagnosis Date  . Chronic kidney disease   . Diabetes mellitus without complication ([place])   . Hyperlipidemia   . Hypertension   . Thyroid disease    was on supplement, taken off by Dr Adam Reid   Past Surgical History:  Procedure Laterality Date  . APPENDECTOMY    . CARDIAC CATHETERIZATION N/A 01/30/2016   Procedure: Left Heart Cath and Coronary Angiography;  Surgeon: Adam Booze, MD;  Location: [facility];  Service: Cardiovascular;  Laterality: N/A;   No Known Allergies Prior to Admission medications   Medication Sig Start Date End Date Taking? Authorizing Provider  aspirin EC 81 MG EC tablet  Take 1 tablet (81 mg total) by mouth daily. 12/03/15  Yes Reid, Adam Pelt, MD  atorvastatin (LIPITOR) 80 MG tablet TAKE 1 TABLET(80 MG) BY MOUTH DAILY 09/09/17  Yes Reid, Chelle, Reid  Reid Glucose Monitoring Suppl (ONETOUCH VERIO) w/Device KIT USE UTD 09/03/15  Yes [provider]  calcitRIOL (ROCALTROL) 0.25 MCG capsule  09/09/17  Yes [provider]  Cholecalciferol (VITAMIN D-3 PO) Take 1 tablet by mouth See admin instructions. Three times a week. No specific days   Yes [provider]  citalopram (CELEXA) 20 MG tablet TAKE 1 TABLET BY MOUTH DAILY 12/29/17  Yes  Adam Guys, MD  cyanocobalamin 500 MCG tablet Take 500 mcg by mouth daily. Reported on 12/02/2015   Yes [provider]  glipiZIDE (GLUCOTROL XL) 10 MG 24 hr tablet Take 1 tablet (10 mg total) by mouth daily with breakfast. 03/19/17  Yes Adam Reid, Reid  GlucoCom Lancets MISC Use for home glucose monitoring 09/02/15  Yes Reid, Chelle, Reid  glucose Reid (ONETOUCH VERIO) test strip USE ONCE DAILY 10/05/17  Yes Reid, Chelle, Reid  metoprolol succinate (TOPROL-XL) 50 MG 24 hr tablet TAKE 1 TABLET BY MOUTH EVERY DAY WITH OR IMMEDIATELY FOLLOWING A MEAL 10/05/17  Yes Reid, Chelle, Reid  nitroGLYCERIN (NITROSTAT) 0.4 MG SL tablet Place 1 tablet (0.4 mg total) under the tongue every 5 (five) minutes x 3 doses as needed for chest pain. 01/31/16  Yes Adam Reid E, NP  pioglitazone (ACTOS) 30 MG tablet Take 1 tablet (30 mg total) by mouth daily. 03/19/17  Yes Reid, Chelle, Reid  tamsulosin (FLOMAX) 0.4 MG CAPS capsule TAKE 2 CAPSULES(0.8 MG) BY MOUTH DAILY Patient taking differently: 0.4 mg daily. TAKE 2 CAPSULES(0.8 MG) BY MOUTH DAILY 10/05/17  Yes Adam Reid, Reid   Social History   Socioeconomic History  . Marital status: Married    Spouse name: [name]  . Number of children: 1  . Years of education: 12th grade  . Highest education level: Not on file  Occupational History  . Occupation: Retired-accountant  Social Needs  . Financial resource strain: Not on file  . Food insecurity:    Worry: Not on file    Inability: Not on file  . Transportation needs:    Medical: Not on file    Non-medical: Not on file  Tobacco Use  . Smoking status: Current Every Day Smoker    Packs/day: 0.25    Years: 48.00    Pack years: 12.00    Types: Cigarettes  . Smokeless tobacco: Never Used  . Tobacco comment: 09/07/16 3-4 cigs daily, trying to cut back  Substance and Sexual Activity  . Alcohol use: No    Alcohol/week: 0.0 oz  . Drug use: No  . Sexual activity: Never    Partners:  Female  Lifestyle  . Physical activity:    Days per week: Not on file    Minutes per session: Not on file  . Stress: Not on file  Relationships  . Social connections:    Talks on phone: Not on file    Gets together: Not on file    Attends religious service: Not on file    Active member of club or organization: Not on file    Attends meetings of clubs or organizations: Not on file    Relationship status: Not on file  . Intimate partner violence:    Fear of current or ex partner: Not on file    Emotionally abused: Not on file  Physically abused: Not on file    Forced sexual activity: Not on file  Other Topics Concern  . Not on file  Social History Narrative   Originally from [place]. Came to the [place] in 2009, following their son who came here for school.   Married.   Lives with his wife.   Their adult son lives in [place], [place], where he is in optometry school.   Education: High School   Exercise: No   Review of Systems  Constitutional: Negative for fatigue and unexpected weight change.  Eyes: Negative for visual disturbance.  Respiratory: Negative for cough, chest tightness and shortness of breath.   Cardiovascular: Negative for chest pain, palpitations and leg swelling.  Gastrointestinal: Negative for abdominal pain and Reid in stool.  Neurological: Negative for dizziness, light-headedness and headaches.       Objective:   Physical Exam  Constitutional: He is oriented to person, place, and time. He appears well-developed and well-nourished.  HENT:  Head: Normocephalic and atraumatic.  Eyes: Pupils are equal, round, and reactive to light. EOM are normal.  Neck: No JVD present. Carotid bruit is not present.  Cardiovascular: Normal rate, regular rhythm and normal heart sounds.  No murmur heard. Pulmonary/Chest: Effort normal and breath sounds normal. He has no rales.  Musculoskeletal: He exhibits no edema.  Neurological: He is alert and oriented to person, place, and time.   Skin: Skin is warm and dry.  Psychiatric: He has a normal mood and affect.  Vitals reviewed.   Vitals:   01/23/18 1318  BP: 114/68  Pulse: 93  Temp: 98.4 F (36.9 C)  TempSrc: Oral  SpO2: 98%  Weight: 150 lb 3.2 oz (68.1 kg)  Height: _0  (1.549 m)       Assessment & Plan:   Dennie Moltz is a 72 y.o. male Chronic kidney disease, unspecified CKD stage - Plan: Basic metabolic panel  -Elevation in creatinine last visit may have been related to volume depletion.  Repeat levels today.  Chronic kidney disease likely associated with diabetes.  Essential hypertension - Plan: Basic metabolic panel  -Improved on lower dose of beta-blocker, tolerating current regimen, labs pending.  Recheck creatinine as above  Type 2 diabetes mellitus with chronic kidney disease, without long-term current use of insulin, unspecified CKD stage ([facility]) - Plan: Microalbumin, urine, Hemoglobin K0U, Basic metabolic panel  -Controlled on last test, continue same dose of glipizide and Actos for now.  Check urine microalbumin.  Recheck in 3 months, and if still controlled, consideration for 18-monthinterval follow-ups.  Hyperlipidemia, unspecified hyperlipidemia type  -Controlled on March labs.  Plan for fasting labs at next visit to determine if dosing changes recommended.  We did discuss current dose of Lipitor and rationale for that dose based on his last LDL and goal less than 70, especially with DM.   Option of lower dose with quick follow-up on testing, but he chose to  remain at same dose.  No orders of the defined types were placed in this encounter.  Patient Instructions    No change in meds today .  I will recheck A1c and kidney test today.  Follow up in 3 months for diabetes and can recheck cholesterol levels at that time.  Thank you for coming in today.    Type 2 Diabetes Mellitus, Self Care, Adult When you have type 2 diabetes (type 2 diabetes mellitus), you must keep your Reid sugar  (glucose) under control. You can do this with:  Nutrition.  Exercise.  Lifestyle changes.  Medicines or insulin, if needed.  Support from your doctors and others.  How do I manage my Reid sugar?  Check your Reid sugar level every day, as often as told.  Call your doctor if your Reid sugar is above your goal numbers for 2 tests in a row.  Have your A1c (hemoglobin A1c) level checked at least two times a year. Have it checked more often if your doctor tells you to. Your doctor will set treatment goals for you. Generally, you should have these Reid sugar levels:  Before meals (preprandial): 80-130 mg/dL (4.4-7.2 mmol/L).  After meals (postprandial): lower than 180 mg/dL (10 mmol/L).  A1c level: less than 7%.  What do I need to know about high Reid sugar? High Reid sugar is called hyperglycemia. Know the signs of high Reid sugar. Signs may include:  Feeling: ? Thirsty. ? Hungry. ? Very tired.  Needing to pee (urinate) more than usual.  Blurry vision.  What do I need to know about low Reid sugar? Low Reid sugar is called hypoglycemia. This is when Reid sugar is at or below 70 mg/dL (3.9 mmol/L). Symptoms may include:  Feeling: ? Hungry. ? Worried or nervous (anxious). ? Sweaty and clammy. ? Confused. ? Dizzy. ? Sleepy. ? Sick to your stomach (nauseous).  Having: ? A fast heartbeat (palpitations). ? A headache. ? A change in your vision. ? Jerky movements that you cannot control (seizure). ? Nightmares. ? Tingling or no feeling (numbness) around the mouth, lips, or tongue.  Having trouble with: ? Talking. ? Paying attention (concentrating). ? Moving (coordination). ? Sleeping.  Shaking.  Passing out (fainting).  Getting upset easily (irritability).  Treating low Reid sugar  To treat low Reid sugar, eat or drink something sugary right away. If you can think clearly and swallow safely, follow the 15:15 rule:  Take 15 grams of a  fast-acting carb (carbohydrate). Some fast-acting carbs are: ? 1 tube of glucose gel. ? 3 sugar tablets (glucose pills). ? 6-8 pieces of hard candy. ? 4 oz (120 mL) of fruit juice. ? 4 oz (120 mL) regular (not diet) soda.  Check your Reid sugar 15 minutes after you take the carb.  If your Reid sugar is still at or below 70 mg/dL (3.9 mmol/L), take 15 grams of a carb again.  If your Reid sugar does not go above 70 mg/dL (3.9 mmol/L) after 3 tries, get help right away.  After your Reid sugar goes back to normal, eat a meal or a snack within 1 hour.  Treating very low Reid sugar If your Reid sugar is at or below 54 mg/dL (3 mmol/L), you have very low Reid sugar (severe hypoglycemia). This is an emergency. Do not wait to see if the symptoms will go away. Get medical help right away. Call your local emergency services (911 in the [place] Do not drive yourself to the hospital. If you have very low Reid sugar and you cannot eat or drink, you may need a glucagon shot (injection). A family member or friend should learn how to check your Reid sugar and how to give you a glucagon shot. Ask your doctor if you need to have a glucagon shot kit at home. What else is important to manage my diabetes? Medicine Follow these instructions about insulin and diabetes medicines:  Take them as told by your doctor.  Adjust them as told by your doctor.  Do not run out of them.  Having diabetes  can raise your risk for other long-term conditions. These include heart or kidney disease. Your doctor may prescribe medicines to help prevent problems from diabetes. Food   Make healthy food choices. These include: ? Chicken, fish, egg whites, and beans. ? Oats, whole wheat, bulgur, brown rice, quinoa, and millet. ? Fresh fruits and vegetables. ? Low-fat dairy products. ? Nuts, avocado, olive oil, and canola oil.  Make a food plan with a specialist (dietitian).  Follow instructions from your doctor about  what you cannot eat or drink.  Drink enough fluid to keep your pee (urine) clear or pale yellow.  Eat healthy snacks between healthy meals.  Keep track of carbs that you eat. Read food labels. Learn food serving sizes.  Follow your sick day plan when you cannot eat or drink normally. Make this plan with your doctor so it is ready to use. Activity  Exercise at least 3 times a week.  Do not go more than 2 days without exercising.  Talk with your doctor before you start a new exercise. Your doctor may need to adjust your insulin, medicines, or food. Lifestyle   Do not use any tobacco products. These include cigarettes, chewing tobacco, and Reid-cigarettes.If you need help quitting, ask your doctor.  Ask your doctor how much alcohol is safe for you.  Learn to deal with stress. If you need help with this, ask your doctor. Body care  Stay up to date with your shots (immunizations).  Have your eyes and feet checked by a doctor as often as told.  Check your skin and feet every day. Check for cuts, bruises, redness, blisters, or sores.  Brush your teeth and gums two times a day.  Floss at least one time a day.  Go to the dentist least one time every 6 months.  Stay at a healthy weight. General instructions   Take over-the-counter and prescription medicines only as told by your doctor.  Share your diabetes care plan with: ? Your work or school. ? People you live with.  Check your pee (urine) for ketones: ? When you are sick. ? As told by your doctor.  Carry a card or wear jewelry that says that you have diabetes.  Ask your doctor: ? Do I need to meet with a diabetes educator? ? Where can I find a support group for people with diabetes?  Keep all follow-up visits as told by your doctor. This is important. Where to find more information: To learn more about diabetes, visit:  American Diabetes Association: www.diabetes.org  American Association of Diabetes Educators:  www.diabeteseducator.org/patient-resources  This information is not intended to replace advice given to you by your health care provider. Make sure you discuss any questions you have with your health care provider. Document Released: 10/20/2015 Document Revised: 12/04/2015 Document Reviewed: 08/01/2015 Elsevier Interactive Patient Education  2018 [employer].    IF you received an x-Reid today, you will receive an invoice from [facility]. Please contact [facility] at [phone] with questions or concerns regarding your invoice.   IF you received labwork today, you will receive an invoice from [facility]. Please contact [facility] at [phone] with questions or concerns regarding your invoice.   Our billing staff will not be able to assist you with questions regarding bills from these companies.  You will be contacted with the lab results as soon as they are available. The fastest way to get your results is to activate your My Chart account. Instructions are located on the last  page of this paperwork. If you have not heard from [place] regarding the results in 2 weeks, please contact this office.       I personally performed the services described in this documentation, which was scribed in my presence. The recorded information has been reviewed and considered for accuracy and completeness, addended by me as needed, and agree with information above.  Signed,   Adam Ray, MD [facility].  01/23/18 1:59 PM

## 2018-01-24 LAB — BASIC METABOLIC PANEL
BUN/Creatinine Ratio: 19 (ref 10–24)
BUN: 42 mg/dL — AB (ref 8–27)
CHLORIDE: 99 mmol/L (ref 96–106)
CO2: 22 mmol/L (ref 20–29)
Calcium: 10 mg/dL (ref 8.6–10.2)
Creatinine, Ser: 2.22 mg/dL — ABNORMAL HIGH (ref 0.76–1.27)
GFR calc non Af Amer: 29 mL/min/{1.73_m2} — ABNORMAL LOW (ref >59)
GFR, EST AFRICAN AMERICAN: 33 mL/min/{1.73_m2} — AB (ref >59)
Glucose: 170 mg/dL — ABNORMAL HIGH (ref 65–99)
Potassium: 4.8 mmol/L (ref 3.5–5.2)
Sodium: 141 mmol/L (ref 134–144)

## 2018-01-24 LAB — MICROALBUMIN, URINE: Microalbumin, Urine: 724.3 ug/mL

## 2018-01-24 LAB — HEMOGLOBIN A1C
ESTIMATED AVERAGE GLUCOSE: 166 mg/dL
Hgb A1c MFr Bld: 7.4 % — ABNORMAL HIGH (ref 4.8–5.6)

## 2018-02-14 DIAGNOSIS — N2581 Secondary hyperparathyroidism of renal origin: Secondary | ICD-10-CM | POA: Diagnosis not present

## 2018-02-14 DIAGNOSIS — N189 Chronic kidney disease, unspecified: Secondary | ICD-10-CM | POA: Diagnosis not present

## 2018-02-14 DIAGNOSIS — E7800 Pure hypercholesterolemia, unspecified: Secondary | ICD-10-CM | POA: Diagnosis not present

## 2018-02-14 DIAGNOSIS — N400 Enlarged prostate without lower urinary tract symptoms: Secondary | ICD-10-CM | POA: Diagnosis not present

## 2018-02-14 DIAGNOSIS — E1122 Type 2 diabetes mellitus with diabetic chronic kidney disease: Secondary | ICD-10-CM | POA: Diagnosis not present

## 2018-02-14 DIAGNOSIS — D631 Anemia in chronic kidney disease: Secondary | ICD-10-CM | POA: Diagnosis not present

## 2018-02-14 DIAGNOSIS — N184 Chronic kidney disease, stage 4 (severe): Secondary | ICD-10-CM | POA: Diagnosis not present

## 2018-02-14 DIAGNOSIS — I129 Hypertensive chronic kidney disease with stage 1 through stage 4 chronic kidney disease, or unspecified chronic kidney disease: Secondary | ICD-10-CM | POA: Diagnosis not present

## 2018-03-28 ENCOUNTER — Other Ambulatory Visit: Payer: Self-pay | Admitting: Family Medicine

## 2018-03-28 DIAGNOSIS — F32A Depression, unspecified: Secondary | ICD-10-CM

## 2018-03-28 DIAGNOSIS — F329 Major depressive disorder, single episode, unspecified: Secondary | ICD-10-CM

## 2018-04-13 ENCOUNTER — Other Ambulatory Visit: Payer: Self-pay | Admitting: Family Medicine

## 2018-04-13 DIAGNOSIS — F329 Major depressive disorder, single episode, unspecified: Secondary | ICD-10-CM

## 2018-04-13 DIAGNOSIS — F32A Depression, unspecified: Secondary | ICD-10-CM

## 2018-04-13 NOTE — Telephone Encounter (Signed)
Requested Prescriptions  Pending Prescriptions Disp Refills  . citalopram (CELEXA) 20 MG tablet [Pharmacy Med Name: CITALOPRAM 20MG  TABLETS] 90 tablet 0    Sig: TAKE 1 TABLET BY MOUTH DAILY     Psychiatry:  Antidepressants - SSRI Passed - 04/13/2018  9:18 AM      Passed - Completed PHQ-2 or PHQ-9 in the last 360 days.      Passed - Valid encounter within last 6 months    Recent Outpatient Visits          2 months ago Chronic kidney disease, unspecified CKD stage   [facility], Ranell Patrick, MD   5 months ago Dizziness   [facility], Lilia Argue, MD   6 months ago Diabetes mellitus with renal manifestations, uncontrolled [facility])   Primary Care at [facility], [place], [place]   1 year ago Essential hypertension   [facility] at [facility], [place], [place]   1 year ago Essential hypertension   Primary Care at [facility], [place], [place]

## 2018-05-04 NOTE — Progress Notes (Signed)
Recent labs ordered by outside provider

## 2018-05-05 ENCOUNTER — Other Ambulatory Visit: Payer: Self-pay | Admitting: Family Medicine

## 2018-05-05 DIAGNOSIS — E118 Type 2 diabetes mellitus with unspecified complications: Secondary | ICD-10-CM

## 2018-05-05 DIAGNOSIS — N401 Enlarged prostate with lower urinary tract symptoms: Secondary | ICD-10-CM

## 2018-05-05 DIAGNOSIS — R351 Nocturia: Secondary | ICD-10-CM

## 2018-05-05 MED ORDER — GLIPIZIDE ER 10 MG PO TB24: 10.0000 mg | ORAL_TABLET | Freq: Every day | ORAL | 0 refills | Status: DC

## 2018-05-05 MED ORDER — TAMSULOSIN HCL 0.4 MG PO CAPS: ORAL_CAPSULE | ORAL | 1 refills | Status: DC

## 2018-05-05 NOTE — Telephone Encounter (Signed)
Requested medication (s) are due for refill today: Yes  Requested medication (s) are on the active medication list: Yes  Last refill:  03/19/17  Future visit scheduled: No  Notes to clinic:  Expired Rx, unable to refill     Requested Prescriptions  Pending Prescriptions Disp Refills   glipiZIDE (GLUCOTROL XL) 10 MG 24 hr tablet 90 tablet 3    Sig: Take 1 tablet (10 mg total) by mouth daily with breakfast.     Endocrinology:  Diabetes - Sulfonylureas Passed - 05/05/2018 12:46 PM      Passed - HBA1C is between 0 and 7.9 and within 180 days    Hgb A1c MFr Bld  Date Value Ref Range Status  01/23/2018 7.4 (H) 4.8 - 5.6 % Final    Comment:             Prediabetes: 5.7 - 6.4          Diabetes: >6.4          Glycemic control for adults with diabetes: <7.0          Passed - Valid encounter within last 6 months    Recent Outpatient Visits          3 months ago Chronic kidney disease, unspecified CKD stage   [facility], Ranell Patrick, MD   6 months ago Dizziness   [facility], Lilia Argue, MD   7 months ago Diabetes mellitus with renal manifestations, uncontrolled ([place])   Primary Care at [facility], [place], [place]   1 year ago Essential hypertension   [facility] at [place], [place], [place]   1 year ago Essential hypertension   [facility] at [place], [place], [place]           Signed Prescriptions Disp Refills   tamsulosin (FLOMAX) 0.4 MG CAPS capsule 90 capsule 1    Sig: TAKE 2 CAPSULES(0.8 MG) BY MOUTH DAILY     Urology: Alpha-Adrenergic Blocker Passed - 05/05/2018 12:46 PM      Passed - Last BP in normal range    BP Readings from Last 1 Encounters:  01/23/18 114/68         Passed - Valid encounter within last 12 months    Recent Outpatient Visits          3 months ago Chronic kidney disease, unspecified CKD stage   [facility], Ranell Patrick, MD   6 months ago Dizziness   [facility], Lilia Argue,  MD   7 months ago Diabetes mellitus with renal manifestations, uncontrolled [facility])   Primary Care at [facility], [place], [place]   1 year ago Essential hypertension   Primary Care at [place], [place], [place]   1 year ago Essential hypertension   [facility] at [place], [place], [place]

## 2018-05-05 NOTE — Telephone Encounter (Signed)
Copied from [place] [id number]. Topic: Quick Communication - Rx Refill/Question >> May 05, 2018 11:44 AM Keene Breath wrote: Medication: glipiZIDE (GLUCOTROL XL) 10 MG 24 hr tablet / tamsulosin (FLOMAX) 0.4 MG CAPS capsule  Patient called to request a refill for the above medications.  CB# [phone]  Preferred Pharmacy (with phone number or street name): [id number] - [address], [place] AT [facility] [address] [phone] (Phone) [phone] (Fax)

## 2018-05-05 NOTE — Telephone Encounter (Signed)
Message sent to scheduling to call pt for appt Oct 2019 - 3 month follow up due with labs with Dr. [name]

## 2018-05-06 ENCOUNTER — Other Ambulatory Visit: Payer: Self-pay | Admitting: Family Medicine

## 2018-05-06 DIAGNOSIS — E118 Type 2 diabetes mellitus with unspecified complications: Secondary | ICD-10-CM

## 2018-06-03 ENCOUNTER — Telehealth: Payer: Self-pay | Admitting: Family Medicine

## 2018-06-03 DIAGNOSIS — E118 Type 2 diabetes mellitus with unspecified complications: Secondary | ICD-10-CM

## 2018-06-05 NOTE — Telephone Encounter (Signed)
Attempted to call patient and schedule an appointment for his refill for glipizide 10 mg tab. No answer, left message for him to call office an schedule.

## 2018-06-07 NOTE — Telephone Encounter (Signed)
Patient's wife called and advised Glipizide was sent to the pharmacy on yesterday, a 30 day supply, she verbalized understanding.

## 2018-06-07 NOTE — Telephone Encounter (Signed)
Pt's wife called and scheduled med refill appointment.  She would like to know if he can have his medication refilled now. He only has two left.

## 2018-06-10 ENCOUNTER — Telehealth: Payer: Self-pay | Admitting: Family Medicine

## 2018-06-10 NOTE — Telephone Encounter (Signed)
Pt's wife called stating that pt needs a refill of the piogliatzone, not the glipizide.  Pt currently has no medication left.  Please advise

## 2018-06-12 ENCOUNTER — Other Ambulatory Visit: Payer: Self-pay | Admitting: *Deleted

## 2018-06-12 ENCOUNTER — Other Ambulatory Visit: Payer: Self-pay | Admitting: Family Medicine

## 2018-06-12 DIAGNOSIS — N183 Chronic kidney disease, stage 3 unspecified: Secondary | ICD-10-CM

## 2018-06-12 DIAGNOSIS — E1122 Type 2 diabetes mellitus with diabetic chronic kidney disease: Secondary | ICD-10-CM

## 2018-06-12 MED ORDER — PIOGLITAZONE HCL 30 MG PO TABS: 30.0000 mg | ORAL_TABLET | Freq: Every day | ORAL | 0 refills | Status: DC

## 2018-06-12 NOTE — Telephone Encounter (Signed)
Pt given Piogliatzone 30 days until appt on 06/20/18.

## 2018-06-13 DIAGNOSIS — N2581 Secondary hyperparathyroidism of renal origin: Secondary | ICD-10-CM | POA: Diagnosis not present

## 2018-06-13 DIAGNOSIS — I129 Hypertensive chronic kidney disease with stage 1 through stage 4 chronic kidney disease, or unspecified chronic kidney disease: Secondary | ICD-10-CM | POA: Diagnosis not present

## 2018-06-13 DIAGNOSIS — E1122 Type 2 diabetes mellitus with diabetic chronic kidney disease: Secondary | ICD-10-CM | POA: Diagnosis not present

## 2018-06-13 DIAGNOSIS — N400 Enlarged prostate without lower urinary tract symptoms: Secondary | ICD-10-CM | POA: Diagnosis not present

## 2018-06-13 DIAGNOSIS — Z23 Encounter for immunization: Secondary | ICD-10-CM | POA: Diagnosis not present

## 2018-06-13 DIAGNOSIS — E7800 Pure hypercholesterolemia, unspecified: Secondary | ICD-10-CM | POA: Diagnosis not present

## 2018-06-13 DIAGNOSIS — N189 Chronic kidney disease, unspecified: Secondary | ICD-10-CM | POA: Diagnosis not present

## 2018-06-13 DIAGNOSIS — N184 Chronic kidney disease, stage 4 (severe): Secondary | ICD-10-CM | POA: Diagnosis not present

## 2018-06-13 DIAGNOSIS — D631 Anemia in chronic kidney disease: Secondary | ICD-10-CM | POA: Diagnosis not present

## 2018-06-20 ENCOUNTER — Other Ambulatory Visit: Payer: Self-pay

## 2018-06-20 ENCOUNTER — Encounter: Payer: Self-pay | Admitting: Family Medicine

## 2018-06-20 ENCOUNTER — Ambulatory Visit (INDEPENDENT_AMBULATORY_CARE_PROVIDER_SITE_OTHER): Payer: Medicare Other | Admitting: Family Medicine

## 2018-06-20 VITALS — BP 130/65 | HR 90 | Temp 98.6°F | Ht 66.0 in | Wt 152.0 lb

## 2018-06-20 DIAGNOSIS — N184 Chronic kidney disease, stage 4 (severe): Secondary | ICD-10-CM

## 2018-06-20 DIAGNOSIS — E118 Type 2 diabetes mellitus with unspecified complications: Secondary | ICD-10-CM | POA: Diagnosis not present

## 2018-06-20 DIAGNOSIS — F32A Depression, unspecified: Secondary | ICD-10-CM

## 2018-06-20 DIAGNOSIS — F17210 Nicotine dependence, cigarettes, uncomplicated: Secondary | ICD-10-CM | POA: Diagnosis not present

## 2018-06-20 DIAGNOSIS — F329 Major depressive disorder, single episode, unspecified: Secondary | ICD-10-CM | POA: Diagnosis not present

## 2018-06-20 DIAGNOSIS — I10 Essential (primary) hypertension: Secondary | ICD-10-CM | POA: Diagnosis not present

## 2018-06-20 DIAGNOSIS — I1310 Hypertensive heart and chronic kidney disease without heart failure, with stage 1 through stage 4 chronic kidney disease, or unspecified chronic kidney disease: Secondary | ICD-10-CM | POA: Diagnosis not present

## 2018-06-20 DIAGNOSIS — E785 Hyperlipidemia, unspecified: Secondary | ICD-10-CM

## 2018-06-20 DIAGNOSIS — E1122 Type 2 diabetes mellitus with diabetic chronic kidney disease: Secondary | ICD-10-CM

## 2018-06-20 MED ORDER — ATORVASTATIN CALCIUM 80 MG PO TABS: ORAL_TABLET | ORAL | 1 refills | Status: DC

## 2018-06-20 MED ORDER — METOPROLOL SUCCINATE ER 25 MG PO TB24: 25.0000 mg | ORAL_TABLET | Freq: Every day | ORAL | 1 refills | Status: DC

## 2018-06-20 MED ORDER — GLIPIZIDE ER 10 MG PO TB24: 10.0000 mg | ORAL_TABLET | Freq: Every day | ORAL | 1 refills | Status: DC

## 2018-06-20 MED ORDER — CITALOPRAM HYDROBROMIDE 20 MG PO TABS: 20.0000 mg | ORAL_TABLET | Freq: Every day | ORAL | 1 refills | Status: DC

## 2018-06-20 NOTE — Patient Instructions (Addendum)
Check blood sugar once per day, but either fasting or 2 hour after meals. Bring record to next visit.  Depending on A1c, and kidney test, we can disuss changes in meds. I would like to see those results first.  No med changes for now, but please return in next 4 weeks with home readings.  (I did change toprol to 25mg  pill - take one per day).   See info on quitting smoking. Let me know if medication is needed to help quitting.    Steps to Quit Smoking Smoking tobacco can be harmful to your health and can affect almost every organ in your body. Smoking puts you, and those around you, at risk for developing many serious chronic diseases. Quitting smoking is difficult, but it is one of the best things that you can do for your health. It is never too late to quit. What are the benefits of quitting smoking? When you quit smoking, you lower your risk of developing serious diseases and conditions, such as:  Lung cancer or lung disease, such as COPD.  Heart disease.  Stroke.  Heart attack.  Infertility.  Osteoporosis and bone fractures.  Additionally, symptoms such as coughing, wheezing, and shortness of breath may get better when you quit. You may also find that you get sick less often because your body is stronger at fighting off colds and infections. If you are pregnant, quitting smoking can help to reduce your chances of having a baby of low birth weight. How do I get ready to quit? When you decide to quit smoking, create a plan to make sure that you are successful. Before you quit:  Pick a date to quit. Set a date within the next two weeks to give you time to prepare.  Write down the reasons why you are quitting. Keep this list in places where you will see it often, such as on your bathroom mirror or in your car or wallet.  Identify the people, places, things, and activities that make you want to smoke (triggers) and avoid them. Make sure to take these actions: ? Throw away all  cigarettes at home, at work, and in your car. ? Throw away smoking accessories, such as Scientist, research (medical). ? Clean your car and make sure to empty the ashtray. ? Clean your home, including curtains and carpets.  Tell your family, friends, and coworkers that you are quitting. Support from your loved ones can make quitting easier.  Talk with your health care provider about your options for quitting smoking.  Find out what treatment options are covered by your health insurance.  What strategies can I use to quit smoking? Talk with your healthcare provider about different strategies to quit smoking. Some strategies include:  Quitting smoking altogether instead of gradually lessening how much you smoke over a period of time. Research shows that quitting "cold [place]" is more successful than gradually quitting.  Attending in-person counseling to help you build problem-solving skills. You are more likely to have success in quitting if you attend several counseling sessions. Even short sessions of 10 minutes can be effective.  Finding resources and support systems that can help you to quit smoking and remain smoke-free after you quit. These resources are most helpful when you use them often. They can include: ? Online chats with a Social worker. ? Telephone quitlines. ? Careers information officer. ? Support groups or group counseling. ? Text messaging programs. ? Mobile phone applications.  Taking medicines to help you quit smoking. (  If you are pregnant or breastfeeding, talk with your health care provider first.) Some medicines contain nicotine and some do not. Both types of medicines help with cravings, but the medicines that include nicotine help to relieve withdrawal symptoms. Your health care provider may recommend: ? Nicotine patches, gum, or lozenges. ? Nicotine inhalers or sprays. ? Non-nicotine medicine that is taken by mouth.  Talk with your health care provider about combining  strategies, such as taking medicines while you are also receiving in-person counseling. Using these two strategies together makes you more likely to succeed in quitting than if you used either strategy on its own. If you are pregnant or breastfeeding, talk with your health care provider about finding counseling or other support strategies to quit smoking. Do not take medicine to help you quit smoking unless told to do so by your health care provider. What things can I do to make it easier to quit? Quitting smoking might feel overwhelming at first, but there is a lot that you can do to make it easier. Take these important actions:  Reach out to your family and friends and ask that they support and encourage you during this time. Call telephone quitlines, reach out to support groups, or work with a counselor for support.  Ask people who smoke to avoid smoking around you.  Avoid places that trigger you to smoke, such as bars, parties, or smoke-break areas at work.  Spend time around people who do not smoke.  Lessen stress in your life, because stress can be a smoking trigger for some people. To lessen stress, try: ? Exercising regularly. ? Deep-breathing exercises. ? Yoga. ? Meditating. ? Performing a body scan. This involves closing your eyes, scanning your body from head to toe, and noticing which parts of your body are particularly tense. Purposefully relax the muscles in those areas.  Download or purchase mobile phone or tablet apps (applications) that can help you stick to your quit plan by providing reminders, tips, and encouragement. There are many free apps, such as QuitGuide from the [employer] Office manager for Disease Control and Prevention). You can find other support for quitting smoking (smoking cessation) through smokefree.gov and other websites.  How will I feel when I quit smoking? Within the first 24 hours of quitting smoking, you may start to feel some withdrawal symptoms. These symptoms  are usually most noticeable 2-3 days after quitting, but they usually do not last beyond 2-3 weeks. Changes or symptoms that you might experience include:  Mood swings.  Restlessness, anxiety, or irritation.  Difficulty concentrating.  Dizziness.  Strong cravings for sugary foods in addition to nicotine.  Mild weight gain.  Constipation.  Nausea.  Coughing or a sore throat.  Changes in how your medicines work in your body.  A depressed mood.  Difficulty sleeping (insomnia).  After the first 2-3 weeks of quitting, you may start to notice more positive results, such as:  Improved sense of smell and taste.  Decreased coughing and sore throat.  Slower heart rate.  Lower blood pressure.  Clearer skin.  The ability to breathe more easily.  Fewer sick days.  Quitting smoking is very challenging for most people. Do not get discouraged if you are not successful the first time. Some people need to make many attempts to quit before they achieve long-term success. Do your best to stick to your quit plan, and talk with your health care provider if you have any questions or concerns. This information is not intended to  replace advice given to you by your health care provider. Make sure you discuss any questions you have with your health care provider. Document Released: 06/22/2001 Document Revised: 02/24/2016 Document Reviewed: 11/12/2014 Elsevier Interactive Patient Education  [employer].    If you have lab work done today you will be contacted with your lab results within the next 2 weeks.  If you have not heard from [place] then please contact us. The fastest way to get your results is to register for My Chart.   IF you received an x-ray today, you will receive an invoice from [facility]. Please contact [facility] at [phone] with questions or concerns regarding your invoice.   IF you received labwork today, you will receive an invoice from  [facility]. Please contact [facility] at [phone] with questions or concerns regarding your invoice.   Our billing staff will not be able to assist you with questions regarding bills from these companies.  You will be contacted with the lab results as soon as they are available. The fastest way to get your results is to activate your My Chart account. Instructions are located on the last page of this paperwork. If you have not heard from [place] regarding the results in 2 weeks, please contact this office.

## 2018-06-20 NOTE — Progress Notes (Signed)
Subjective:    Patient ID: Adam Reid, male    DOB: [birth year], 72 y.o.   MRN: [id number]  HPI Adam Reid is a 72 y.o. male Presents today for: Chief Complaint  Patient presents with  . chronic conditions    f/u with all medication refills  . MEDICATION Question    continue glipizide?  . Diabetes    supplies needed   Wife translating - Farsi.   Diabetes: Complicated  By CKD, stage Lab Results  Component Value Date   HGBA1C 7.4 (H) 01/23/2018   HGBA1C 6.9 (H) 10/05/2017   HGBA1C 7.2 03/19/2017   Lab Results  Component Value Date   MICROALBUR 60.3 09/02/2015   LDLCALC 53 10/05/2017   CREATININE 2.22 (H) 01/23/2018  Due for ophthalmology exam, up-to-date on foot exam, Pneumovax. Elevated urine microalbumin 724 on 01/23/2018 No missed doses of Actos or Glipizide - taking with breakfast.  Home CBG's in am 120-138,  No 2 hour postprandial.   Tobacco abuse: Still smoking - 4-5 cigs per day.   Hyperlipidemia:  Lab Results  Component Value Date   CHOL 160 10/05/2017   HDL 78 10/05/2017   LDLCALC 53 10/05/2017   TRIG 145 10/05/2017   CHOLHDL 2.1 10/05/2017   Lab Results  Component Value Date   ALT 10 10/25/2017   AST 11 10/25/2017   ALKPHOS 115 10/25/2017   BILITOT <0.2 10/25/2017  Lipitor 80 mg daily. No new side effects.   Hypertension: BP Readings from Last 3 Encounters:  06/20/18 130/65  01/23/18 114/68  10/25/17 118/60   Lab Results  Component Value Date   CREATININE 2.22 (H) 01/23/2018  Hypertension with chronic kidney disease.  Takes Toprol -decreased to half tablet in April when having dizziness.  Stable at last visit. On review of renal function, range of 1.9 to to 2.74 over the past 2 years Nephrology: [facility], appointment in August.  Stage IV chronic kidney disease, secondary to diabetes.  Plan for access placement when GFR 20 or less.  Reports egfr around 35% on testing last week.   Depression: Celexa 20 mg  daily. Mood has being doing well.  Depression screen [facility] 2/9 06/20/2018 01/23/2018 10/25/2017 03/19/2017 03/19/2017  Decreased Interest 0 0 0 0 0  Down, Depressed, Hopeless 0 0 0 0 0  PHQ - 2 Score 0 0 0 0 0  Altered sleeping - - - - -  Tired, decreased energy - - - - -  Change in appetite - - - - -  Feeling bad or failure about yourself  - - - - -  Trouble concentrating - - - - -  Moving slowly or fidgety/restless - - - - -  Suicidal thoughts - - - - -  PHQ-9 Score - - - - -  Difficult doing work/chores - - - - -    Patient Active Problem List   Diagnosis Date Noted  . Secondary hyperparathyroidism, renal ([place]) 12/29/2016  . CAD (coronary artery disease), native coronary artery 08/02/2016  . Smoker 03/26/2016  . Hypotension 01/29/2016  . Abnormal myocardial perfusion study 01/29/2016  . Depression 12/09/2015  . Hyperlipidemia LDL goal <100 06/28/2014  . Diabetes mellitus with renal manifestations, controlled ([place])   . HTN (hypertension) 06/24/2014  . 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 BPH (benign prostatic hyperplasia) 06/24/2014   Past Medical History:  Diagnosis Date  . Chronic kidney disease   . Diabetes mellitus without  complication ([place])   . Hyperlipidemia   . Hypertension   . Thyroid disease    was on supplement, taken off by [name]   Past Surgical History:  Procedure Laterality Date  . APPENDECTOMY    . CARDIAC CATHETERIZATION N/A 01/30/2016   Procedure: Left Heart Cath and Coronary Angiography;  Surgeon: [name], MD;  Location: [facility];  Service: Cardiovascular;  Laterality: N/A;   No Known Allergies Prior to Admission medications   Medication Sig Start Date End Date Taking? Authorizing Provider  aspirin EC 81 MG EC tablet Take 1 tablet (81 mg total) by mouth daily. Patient taking differently: Take 81 mg by mouth every other day.  12/03/15  Yes Mayo, Pete Pelt, MD  atorvastatin  (LIPITOR) 80 MG tablet TAKE 1 TABLET(80 MG) BY MOUTH DAILY 09/09/17  Yes Jeffery, [place], PA  Blood Glucose Monitoring Suppl (ONETOUCH VERIO) w/Device KIT USE UTD 09/03/15  Yes [provider]  calcitRIOL (ROCALTROL) 0.25 MCG capsule  09/09/17  Yes [provider]  Cholecalciferol (VITAMIN D-3 PO) Take 1 tablet by mouth See admin instructions. Three times a week. No specific days   Yes [provider]  citalopram (CELEXA) 20 MG tablet TAKE 1 TABLET BY MOUTH DAILY 04/13/18  Yes Rutherford Guys, MD  cyanocobalamin 500 MCG tablet Take 500 mcg by mouth daily. Reported on 12/02/2015   Yes [provider]  glipiZIDE (GLUCOTROL XL) 10 MG 24 hr tablet TAKE 1 TABLET BY MOUTH DAILY WITH BREAKFAST 06/06/18  Yes [name], MD  GlucoCom Lancets MISC Use for home glucose monitoring 09/02/15  Yes Jeffery, [place], PA  glucose blood (ONETOUCH VERIO) test strip USE ONCE DAILY 10/05/17  Yes Jeffery, [place], PA  metoprolol succinate (TOPROL-XL) 50 MG 24 hr tablet TAKE 1 TABLET BY MOUTH EVERY DAY WITH OR IMMEDIATELY FOLLOWING A MEAL 10/05/17  Yes Jeffery, [place], PA  nitroGLYCERIN (NITROSTAT) 0.4 MG SL tablet Place 1 tablet (0.4 mg total) under the tongue every 5 (five) minutes x 3 doses as needed for chest pain. 01/31/16  Yes [name] E, NP  pioglitazone (ACTOS) 30 MG tablet Take 1 tablet (30 mg total) by mouth daily. 06/12/18  Yes [name], MD  tamsulosin (FLOMAX) 0.4 MG CAPS capsule TAKE 2 CAPSULES(0.8 MG) BY MOUTH DAILY 05/05/18  Yes [name], MD   Social History   Socioeconomic History  . Marital status: Married    Spouse name: [name]  . Number of children: 1  . Years of education: 12th grade  . Highest education level: Not on file  Occupational History  . Occupation: Retired-accountant  Social Needs  . Financial resource strain: Not on file  . Food insecurity:    Worry: Not on file    Inability: Not on file  . Transportation needs:    Medical: Not  on file    Non-medical: Not on file  Tobacco Use  . Smoking status: Current Every Day Smoker    Packs/day: 0.25    Years: 48.00    Pack years: 12.00    Types: Cigarettes  . Smokeless tobacco: Never Used  . Tobacco comment: 09/07/16 3-4 cigs daily, trying to cut back  Substance and Sexual Activity  . Alcohol use: No    Alcohol/week: 0.0 standard drinks  . Drug use: No  . Sexual activity: Never    Partners: Female  Lifestyle  . Physical activity:    Days per week: Not on file    Minutes  per session: Not on file  . Stress: Not on file  Relationships  . Social connections:    Talks on phone: Not on file    Gets together: Not on file    Attends religious service: Not on file    Active member of club or organization: Not on file    Attends meetings of clubs or organizations: Not on file    Relationship status: Not on file  . Intimate partner violence:    Fear of current or ex partner: Not on file    Emotionally abused: Not on file    Physically abused: Not on file    Forced sexual activity: Not on file  Other Topics Concern  . Not on file  Social History Narrative   Originally from [place]. Came to the [place] in 2009, following their son who came here for school.   Married.   Lives with his wife.   Their adult son lives in [place], [place], where he is in optometry school.   Education: [employer]   Exercise: No    Review of Systems Per HPI.     Objective:   Physical Exam Vitals signs reviewed.  Constitutional:      Appearance: He is well-developed.  HENT:     Head: Normocephalic and atraumatic.  Eyes:     Pupils: Pupils are equal, round, and reactive to light.  Neck:     Vascular: No carotid bruit or JVD.  Cardiovascular:     Rate and Rhythm: Normal rate and regular rhythm.     Heart sounds: Normal heart sounds. No murmur.  Pulmonary:     Effort: Pulmonary effort is normal.     Breath sounds: Normal breath sounds. No rales.  Skin:    General: Skin is warm and dry.    Neurological:     Mental Status: He is alert and oriented to person, place, and time.    Vitals:   06/20/18 1543  BP: 130/65  Pulse: 90  Temp: 98.6 F (37 C)  TempSrc: Oral  SpO2: 100%  Weight: 152 lb (68.9 kg)  Height: 5' 6" (1.676 m)      Assessment & Plan:    Adam Reid is a 72 y.o. male Type 2 diabetes mellitus with stage 4 chronic kidney disease, without long-term current use of insulin ([place]) - Plan: Hemoglobin A1c DM type 2, controlled, with complication ([place]) - Plan: glipiZIDE (GLUCOTROL XL) 10 MG 24 hr tablet  -Check fasting, 2-hour postprandial readings, and A1c to determine med adjustments.  Hyperlipidemia, unspecified hyperlipidemia type - Plan: atorvastatin (LIPITOR) 80 MG tablet, Comprehensive metabolic panel, Lipid panel  - Stable, tolerating current regimen. Medications refilled. Labs pending as above.   Essential hypertension - Plan: metoprolol succinate (TOPROL-XL) 25 MG 24 hr tablet  -Discussed other potential classes of antihypertensives, but ultimately decided to remain on same dose Toprol for now.  Depression - Plan: citalopram (CELEXA) 20 MG tablet  -Stable with Celexa, no changes.  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  Labs pending as above.  Cigarette nicotine dependence without complication  -Cessation discussed, handout given for assistance if needed.  Meds ordered this encounter  Medications  . atorvastatin (LIPITOR) 80 MG tablet    Sig: TAKE 1 TABLET(80 MG) BY MOUTH DAILY    Dispense:  90 tablet    Refill:  1  . citalopram (CELEXA) 20 MG tablet    Sig: Take 1  tablet (20 mg total) by mouth daily.    Dispense:  90 tablet    Refill:  1  . glipiZIDE (GLUCOTROL XL) 10 MG 24 hr tablet    Sig: Take 1 tablet (10 mg total) by mouth daily with breakfast.    Dispense:  90 tablet    Refill:  1  . metoprolol succinate (TOPROL-XL) 25 MG 24 hr tablet    Sig: Take 1  tablet (25 mg total) by mouth daily.    Dispense:  90 tablet    Refill:  1   Patient Instructions     Check blood sugar once per day, but either fasting or 2 hour after meals. Bring record to next visit.  Depending on A1c, and kidney test, we can disuss changes in meds. I would like to see those results first.  No med changes for now, but please return in next 4 weeks with home readings.  (I did change toprol to 6m pill - take one per day).   See info on quitting smoking. Let me know if medication is needed to help quitting.    Steps to Quit Smoking Smoking tobacco can be harmful to your health and can affect almost every organ in your body. Smoking puts you, and those around you, at risk for developing many serious chronic diseases. Quitting smoking is difficult, but it is one of the best things that you can do for your health. It is never too late to quit. What are the benefits of quitting smoking? When you quit smoking, you lower your risk of developing serious diseases and conditions, such as:  Lung cancer or lung disease, such as COPD.  Heart disease.  Stroke.  Heart attack.  Infertility.  Osteoporosis and bone fractures.  Additionally, symptoms such as coughing, wheezing, and shortness of breath may get better when you quit. You may also find that you get sick less often because your body is stronger at fighting off colds and infections. If you are pregnant, quitting smoking can help to reduce your chances of having a baby of low birth weight. How do I get ready to quit? When you decide to quit smoking, create a plan to make sure that you are successful. Before you quit:  Pick a date to quit. Set a date within the next two weeks to give you time to prepare.  Write down the reasons why you are quitting. Keep this list in places where you will see it often, such as on your bathroom mirror or in your car or wallet.  Identify the people, places, things, and activities that  make you want to smoke (triggers) and avoid them. Make sure to take these actions: ? Throw away all cigarettes at home, at work, and in your car. ? Throw away smoking accessories, such as aScientist, research (medical) ? Clean your car and make sure to empty the ashtray. ? Clean your home, including curtains and carpets.  Tell your family, friends, and coworkers that you are quitting. Support from your loved ones can make quitting easier.  Talk with your health care provider about your options for quitting smoking.  Find out what treatment options are covered by your health insurance.  What strategies can I use to quit smoking? Talk with your healthcare provider about different strategies to quit smoking. Some strategies include:  Quitting smoking altogether instead of gradually lessening how much you smoke over a period of time. Research shows that quitting "cold [place] is more successful than gradually  quitting.  Attending in-person counseling to help you build problem-solving skills. You are more likely to have success in quitting if you attend several counseling sessions. Even short sessions of 10 minutes can be effective.  Finding resources and support systems that can help you to quit smoking and remain smoke-free after you quit. These resources are most helpful when you use them often. They can include: ? Online chats with a Social worker. ? Telephone quitlines. ? Careers information officer. ? Support groups or group counseling. ? Text messaging programs. ? Mobile phone applications.  Taking medicines to help you quit smoking. (If you are pregnant or breastfeeding, talk with your health care provider first.) Some medicines contain nicotine and some do not. Both types of medicines help with cravings, but the medicines that include nicotine help to relieve withdrawal symptoms. Your health care provider may recommend: ? Nicotine patches, gum, or lozenges. ? Nicotine inhalers or  sprays. ? Non-nicotine medicine that is taken by mouth.  Talk with your health care provider about combining strategies, such as taking medicines while you are also receiving in-person counseling. Using these two strategies together makes you more likely to succeed in quitting than if you used either strategy on its own. If you are pregnant or breastfeeding, talk with your health care provider about finding counseling or other support strategies to quit smoking. Do not take medicine to help you quit smoking unless told to do so by your health care provider. What things can I do to make it easier to quit? Quitting smoking might feel overwhelming at first, but there is a lot that you can do to make it easier. Take these important actions:  Reach out to your family and friends and ask that they support and encourage you during this time. Call telephone quitlines, reach out to support groups, or work with a counselor for support.  Ask people who smoke to avoid smoking around you.  Avoid places that trigger you to smoke, such as bars, parties, or smoke-break areas at work.  Spend time around people who do not smoke.  Lessen stress in your life, because stress can be a smoking trigger for some people. To lessen stress, try: ? Exercising regularly. ? Deep-breathing exercises. ? Yoga. ? Meditating. ? Performing a body scan. This involves closing your eyes, scanning your body from head to toe, and noticing which parts of your body are particularly tense. Purposefully relax the muscles in those areas.  Download or purchase mobile phone or tablet apps (applications) that can help you stick to your quit plan by providing reminders, tips, and encouragement. There are many free apps, such as QuitGuide from the [employer] Office manager for Disease Control and Prevention). You can find other support for quitting smoking (smoking cessation) through smokefree.gov and other websites.  How will I feel when I quit  smoking? Within the first 24 hours of quitting smoking, you may start to feel some withdrawal symptoms. These symptoms are usually most noticeable 2-3 days after quitting, but they usually do not last beyond 2-3 weeks. Changes or symptoms that you might experience include:  Mood swings.  Restlessness, anxiety, or irritation.  Difficulty concentrating.  Dizziness.  Strong cravings for sugary foods in addition to nicotine.  Mild weight gain.  Constipation.  Nausea.  Coughing or a sore throat.  Changes in how your medicines work in your body.  A depressed mood.  Difficulty sleeping (insomnia).  After the first 2-3 weeks of quitting, you may start to notice more positive  results, such as:  Improved sense of smell and taste.  Decreased coughing and sore throat.  Slower heart rate.  Lower blood pressure.  Clearer skin.  The ability to breathe more easily.  Fewer sick days.  Quitting smoking is very challenging for most people. Do not get discouraged if you are not successful the first time. Some people need to make many attempts to quit before they achieve long-term success. Do your best to stick to your quit plan, and talk with your health care provider if you have any questions or concerns. This information is not intended to replace advice given to you by your health care provider. Make sure you discuss any questions you have with your health care provider. Document Released: 06/22/2001 Document Revised: 02/24/2016 Document Reviewed: 11/12/2014 Elsevier Interactive Patient Education  [employer].    If you have lab work done today you will be contacted with your lab results within the next 2 weeks.  If you have not heard from [place] then please contact us. The fastest way to get your results is to register for My Chart.   IF you received an x-ray today, you will receive an invoice from [facility]. Please contact [facility] at [phone] with  questions or concerns regarding your invoice.   IF you received labwork today, you will receive an invoice from [facility]. Please contact [facility] at [phone] with questions or concerns regarding your invoice.   Our billing staff will not be able to assist you with questions regarding bills from these companies.  You will be contacted with the lab results as soon as they are available. The fastest way to get your results is to activate your My Chart account. Instructions are located on the last page of this paperwork. If you have not heard from [place] regarding the results in 2 weeks, please contact this office.       Signed,   Merri Ray, MD [facility].  06/24/18 3:33 PM

## 2018-06-21 LAB — HEMOGLOBIN A1C
ESTIMATED AVERAGE GLUCOSE: 171 mg/dL
Hgb A1c MFr Bld: 7.6 % — ABNORMAL HIGH (ref 4.8–5.6)

## 2018-06-21 LAB — COMPREHENSIVE METABOLIC PANEL
ALBUMIN: 5 g/dL — AB (ref 3.5–4.8)
ALT: 15 IU/L (ref 0–44)
AST: 15 IU/L (ref 0–40)
Albumin/Globulin Ratio: 2.1 (ref 1.2–2.2)
Alkaline Phosphatase: 101 IU/L (ref 39–117)
BILIRUBIN TOTAL: 0.2 mg/dL (ref 0.0–1.2)
BUN/Creatinine Ratio: 20 (ref 10–24)
BUN: 43 mg/dL — AB (ref 8–27)
CO2: 21 mmol/L (ref 20–29)
CREATININE: 2.15 mg/dL — AB (ref 0.76–1.27)
Calcium: 9.8 mg/dL (ref 8.6–10.2)
Chloride: 102 mmol/L (ref 96–106)
GFR calc Af Amer: 35 mL/min/{1.73_m2} — ABNORMAL LOW (ref >59)
GFR, EST NON AFRICAN AMERICAN: 30 mL/min/{1.73_m2} — AB (ref >59)
GLOBULIN, TOTAL: 2.4 g/dL (ref 1.5–4.5)
Glucose: 54 mg/dL — ABNORMAL LOW (ref 65–99)
POTASSIUM: 4.3 mmol/L (ref 3.5–5.2)
SODIUM: 140 mmol/L (ref 134–144)
Total Protein: 7.4 g/dL (ref 6.0–8.5)

## 2018-06-21 LAB — LIPID PANEL
Chol/HDL Ratio: 2.2 ratio (ref 0.0–5.0)
Cholesterol, Total: 179 mg/dL (ref 100–199)
HDL: 83 mg/dL
LDL Calculated: 76 mg/dL (ref 0–99)
Triglycerides: 98 mg/dL (ref 0–149)
VLDL Cholesterol Cal: 20 mg/dL (ref 5–40)

## 2018-07-05 ENCOUNTER — Other Ambulatory Visit: Payer: Self-pay | Admitting: Family Medicine

## 2018-07-05 DIAGNOSIS — E118 Type 2 diabetes mellitus with unspecified complications: Secondary | ICD-10-CM

## 2018-07-07 ENCOUNTER — Ambulatory Visit (INDEPENDENT_AMBULATORY_CARE_PROVIDER_SITE_OTHER): Payer: Medicare Other | Admitting: Family Medicine

## 2018-07-07 ENCOUNTER — Telehealth: Payer: Self-pay

## 2018-07-07 DIAGNOSIS — Z111 Encounter for screening for respiratory tuberculosis: Secondary | ICD-10-CM

## 2018-07-07 NOTE — Patient Instructions (Signed)
  Tuberculosis Risk Questionnaire  1. No Were you born outside the [place] in one of the following parts of the world: [place], [place], [place], [place] or [place]?  2  2. No Have you traveled outside the [place] and lived for more than one month in one of the following parts of the world: [place], [place], [place], [place] or [place]?    3. Yes  Do you have a compromised immune system such as from any of the following conditions:HIV/AIDS, organ or bone marrow transplantation, diabetes, immunosuppressive medicines (e.g. Prednisone, Remicaide), leukemia, lymphoma, cancer of the head or neck, gastrectomy or jejunal bypass, end-stage renal disease (on dialysis), or silicosis?     4. Yes  Have you ever or do you plan on working in: a residential care center, a health care facility, a jail or prison or homeless shelter?    5. No Have you ever: injected illegal drugs, used crack cocaine, lived in a homeless shelter  or been in jail or prison?     6. No Have you ever been exposed to anyone with infectious tuberculosis?  7. No Have you ever had a BCG vaccine? (BCG is a vaccine for tuberculosis  (TB) used in OTHER countries, NOT in the [place]).  8. No Have you ever been advised by a health care provider NOT to have a TB skin test?  9. No Have you ever had a POSITIVE TB skin test?  IF SO, when? n/a  IF SO, were you treated with INH? n/a  IF SO, where? n/a  Tuberculosis Symptom Questionnaire  Do you currently have any of the following symptoms?  1. No Unexplained cough lasting more than 3 weeks?   2. No Unexplained fever lasting more than 3 weeks.   3. No Night Sweats (sweating that leaves the bedclothes and sheets wet)     4. No Shortness of Breath   5. No Chest Pain   6. No Unintentional weight loss    7. No Unexplained fatigue (very tired for no reason)

## 2018-07-07 NOTE — Telephone Encounter (Signed)
Pt is to have form for verification of employment per [facility] or tested CANNOT be read!!! Pt will need translatir

## 2018-07-08 ENCOUNTER — Other Ambulatory Visit: Payer: Self-pay | Admitting: Family Medicine

## 2018-07-08 DIAGNOSIS — N183 Chronic kidney disease, stage 3 unspecified: Secondary | ICD-10-CM

## 2018-07-08 DIAGNOSIS — E1122 Type 2 diabetes mellitus with diabetic chronic kidney disease: Secondary | ICD-10-CM

## 2018-07-09 NOTE — Progress Notes (Signed)
Not seen by provider. Here for tb test placement.

## 2018-07-10 ENCOUNTER — Ambulatory Visit (INDEPENDENT_AMBULATORY_CARE_PROVIDER_SITE_OTHER): Payer: Medicare Other | Admitting: Family Medicine

## 2018-07-10 ENCOUNTER — Other Ambulatory Visit: Payer: Self-pay | Admitting: Family Medicine

## 2018-07-10 DIAGNOSIS — N183 Chronic kidney disease, stage 3 unspecified: Secondary | ICD-10-CM

## 2018-07-10 DIAGNOSIS — E1122 Type 2 diabetes mellitus with diabetic chronic kidney disease: Secondary | ICD-10-CM

## 2018-07-10 DIAGNOSIS — Z111 Encounter for screening for respiratory tuberculosis: Secondary | ICD-10-CM

## 2018-07-10 LAB — TB SKIN TEST
Induration: 0 mm
TB Skin Test: NEGATIVE

## 2018-07-10 NOTE — Patient Instructions (Signed)
PPD reading

## 2018-07-10 NOTE — Progress Notes (Signed)
Nurse only visit. Orders Placed This Encounter  Procedures  . TB Skin Test    Order Specific Question:   Has patient ever tested positive?    Answer:   No   Results for orders placed or performed in visit on 07/10/18  TB Skin Test  Result Value Ref Range   TB Skin Test Negative    Induration 0 mm

## 2018-07-11 NOTE — Telephone Encounter (Signed)
Requested Prescriptions  Pending Prescriptions Disp Refills  . pioglitazone (ACTOS) 30 MG tablet [Pharmacy Med Name: PIOGLITAZONE 30MG  TABLETS] 90 tablet     Sig: TAKE 1 TABLET BY MOUTH EVERY DAY     Endocrinology:  Diabetes - Glitazones - pioglitazone Passed - 07/10/2018  1:08 PM      Passed - HBA1C is between 0 and 7.9 and within 180 days    Hgb A1c MFr Bld  Date Value Ref Range Status  06/20/2018 7.6 (H) 4.8 - 5.6 % Final    Comment:             Prediabetes: 5.7 - 6.4          Diabetes: >6.4          Glycemic control for adults with diabetes: <7.0          Passed - Valid encounter within last 6 months    Recent Outpatient Visits          Yesterday PPD screening test   [facility], Laurey Arrow, MD   4 days ago PPD screening test   [facility], Laurey Arrow, MD   3 weeks ago Type 2 diabetes mellitus with stage 4 chronic kidney disease, without long-term current use of insulin [facility])   [facility], Ranell Patrick, MD   5 months ago Chronic kidney disease, unspecified CKD stage   [facility], Ranell Patrick, MD   8 months ago Dizziness   [facility], Lilia Argue, MD      Future Appointments            In 2 weeks [name] Ranell Patrick, MD [facility] [place], [facility]

## 2018-07-13 DIAGNOSIS — Z111 Encounter for screening for respiratory tuberculosis: Secondary | ICD-10-CM | POA: Diagnosis not present

## 2018-07-25 ENCOUNTER — Encounter: Payer: Self-pay | Admitting: Family Medicine

## 2018-07-25 ENCOUNTER — Ambulatory Visit (INDEPENDENT_AMBULATORY_CARE_PROVIDER_SITE_OTHER): Payer: Medicare Other | Admitting: Family Medicine

## 2018-07-25 ENCOUNTER — Other Ambulatory Visit: Payer: Self-pay

## 2018-07-25 VITALS — BP 109/61 | HR 98 | Temp 98.4°F | Resp 14 | Ht 66.5 in | Wt 152.4 lb

## 2018-07-25 DIAGNOSIS — E1122 Type 2 diabetes mellitus with diabetic chronic kidney disease: Secondary | ICD-10-CM

## 2018-07-25 DIAGNOSIS — H05349 Enlargement of unspecified orbit: Secondary | ICD-10-CM

## 2018-07-25 DIAGNOSIS — R6889 Other general symptoms and signs: Secondary | ICD-10-CM

## 2018-07-25 DIAGNOSIS — R142 Eructation: Secondary | ICD-10-CM | POA: Diagnosis not present

## 2018-07-25 NOTE — Progress Notes (Signed)
Subjective:    Patient ID: Adam Reid, male    DOB: 1945/11/08, 73 y.o.   MRN: [id number]  HPI Adam Reid is a 73 y.o. male Presents today for: Chief Complaint  Patient presents with  . Diabetes    Blood sugar has been running 133-298 thia morning it was 298   Diabetes: Here for follow-up.  Most recently seen June 20, 2018.  A1c not quite at goal but close.  Advised to check fasting, 2-hour postprandials and A1c to determine potential med adjustments.  He was taking Actos and glipizide as of last visit.  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  Home readings:  fasting: 126-188 2hr PP: 133-314 (but variability with some readings in 130's and few 200's, few 300's) No symptomatic lows.  No recent dietician.  Health maintenance: Optho: plans to schedule.  Colonoscopy - thinks was 2-3 years ago - spouse plans to check.   Lab Results  Component Value Date   HGBA1C 7.6 (H) 06/20/2018   HGBA1C 7.4 (H) 01/23/2018   HGBA1C 6.9 (H) 10/05/2017   Lab Results  Component Value Date   MICROALBUR 60.3 09/02/2015   LDLCALC 76 06/20/2018   CREATININE 2.15 (H) 06/20/2018   Puffiness under eyes: Has noticed extra skin/redundant skin for 20 years. Seems worse. Has not discussed with optho. No  intraocular symptoms. No itching, d/c. Not treating with any meds/creams.    Gas/belching symptoms: Does not seem to be associated with certain foods. Daily. No vomiting. No heartburn, just  Gas/belching. Rare carbonated beverages. No acute worsening sx's.    Patient Active Problem List   Diagnosis Date Noted  . Secondary hyperparathyroidism, renal ([facility]) 12/29/2016  . CAD (coronary artery disease), native coronary artery 08/02/2016  . Smoker 03/26/2016  . Hypotension 01/29/2016  . Abnormal myocardial perfusion study 01/29/2016  . Depression 12/09/2015  . Hyperlipidemia LDL goal <100 06/28/2014  . Diabetes mellitus with renal manifestations,  controlled ([place])   . HTN (hypertension) 06/24/2014  . 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 BPH (benign prostatic hyperplasia) 06/24/2014   Past Medical History:  Diagnosis Date  . Chronic kidney disease   . Diabetes mellitus without complication ([place])   . Hyperlipidemia   . Hypertension   . Thyroid disease    was on supplement, taken off by [name]   Past Surgical History:  Procedure Laterality Date  . APPENDECTOMY    . CARDIAC CATHETERIZATION N/A 01/30/2016   Procedure: Left Heart Cath and Coronary Angiography;  Surgeon: [name], MD;  Location: [facility];  Service: Cardiovascular;  Laterality: N/A;   No Known Allergies Prior to Admission medications   Medication Sig Start Date End Date Taking? Authorizing Provider  aspirin EC 81 MG EC tablet Take 1 tablet (81 mg total) by mouth daily. Patient taking differently: Take 81 mg by mouth every other day.  12/03/15  Yes Mayo, Pete Pelt, MD  atorvastatin (LIPITOR) 80 MG tablet TAKE 1 TABLET(80 MG) BY MOUTH DAILY 06/20/18  Yes [name], MD  Blood Glucose Monitoring Suppl (ONETOUCH VERIO) w/Device KIT USE UTD 09/03/15  Yes [provider]  calcitRIOL (ROCALTROL) 0.25 MCG capsule  09/09/17  Yes [provider]  Cholecalciferol (VITAMIN D-3 PO) Take 1 tablet by mouth See admin instructions. Three times a week. No specific days   Yes [provider]  citalopram (CELEXA) 20 MG tablet Take 1 tablet (20 mg total)  by mouth daily. 06/20/18  Yes [name], MD  cyanocobalamin 500 MCG tablet Take 500 mcg by mouth daily. Reported on 12/02/2015   Yes [provider]  glipiZIDE (GLUCOTROL XL) 10 MG 24 hr tablet TAKE 1 TABLET BY MOUTH DAILY WITH BREAKFAST 07/06/18  Yes [name], MD  GlucoCom Lancets MISC Use for home glucose monitoring 09/02/15  Yes Jeffery, Chelle, PA  glucose blood (ONETOUCH VERIO) test strip USE ONCE DAILY  10/05/17  Yes Jeffery, Chelle, PA  metoprolol succinate (TOPROL-XL) 25 MG 24 hr tablet Take 1 tablet (25 mg total) by mouth daily. 06/20/18  Yes [name], MD  nitroGLYCERIN (NITROSTAT) 0.4 MG SL tablet Place 1 tablet (0.4 mg total) under the tongue every 5 (five) minutes x 3 doses as needed for chest pain. 01/31/16  Yes [name] E, NP  pioglitazone (ACTOS) 30 MG tablet TAKE 1 TABLET BY MOUTH EVERY DAY 07/11/18  Yes [name], MD  tamsulosin (FLOMAX) 0.4 MG CAPS capsule TAKE 2 CAPSULES(0.8 MG) BY MOUTH DAILY 05/05/18  Yes [name], MD   Social History   Socioeconomic History  . Marital status: Married    Spouse name: [name]  . Number of children: 1  . Years of education: 12th grade  . Highest education level: Not on file  Occupational History  . Occupation: Retired-accountant  Social Needs  . Financial resource strain: Not on file  . Food insecurity:    Worry: Not on file    Inability: Not on file  . Transportation needs:    Medical: Not on file    Non-medical: Not on file  Tobacco Use  . Smoking status: Current Every Day Smoker    Packs/day: 0.25    Years: 48.00    Pack years: 12.00    Types: Cigarettes  . Smokeless tobacco: Never Used  . Tobacco comment: 09/07/16 3-4 cigs daily, trying to cut back  Substance and Sexual Activity  . Alcohol use: No    Alcohol/week: 0.0 standard drinks  . Drug use: No  . Sexual activity: Never    Partners: Female  Lifestyle  . Physical activity:    Days per week: Not on file    Minutes per session: Not on file  . Stress: Not on file  Relationships  . Social connections:    Talks on phone: Not on file    Gets together: Not on file    Attends religious service: Not on file    Active member of club or organization: Not on file    Attends meetings of clubs or organizations: Not on file    Relationship status: Not on file  . Intimate partner violence:    Fear of current or ex partner: Not on file    Emotionally  abused: Not on file    Physically abused: Not on file    Forced sexual activity: Not on file  Other Topics Concern  . Not on file  Social History Narrative   Originally from [place]. Came to the [place] in 2009, following their son who came here for school.   Married.   Lives with his wife.   Their adult son lives in [place], [place], where he is in optometry school.   Education: High School   Exercise: No    Review of Systems Per HPI    Objective:   Physical Exam Constitutional:      General: He is not in acute distress.    Appearance: He is  well-developed.  HENT:     Head: Normocephalic and atraumatic.  Cardiovascular:     Rate and Rhythm: Normal rate and regular rhythm.  Pulmonary:     Effort: Pulmonary effort is normal.     Breath sounds: Normal breath sounds.  Abdominal:     General: There is no distension.     Tenderness: There is no abdominal tenderness. There is no guarding or rebound.  Neurological:     Mental Status: He is alert and oriented to person, place, and time.    Vitals:   07/25/18 1349  BP: 109/61  Pulse: 98  Resp: 14  Temp: 98.4 F (36.9 C)  TempSrc: Oral  SpO2: 98%  Weight: 152 lb 6.4 oz (69.1 kg)  Height: 5' 6.5" (1.689 m)       Assessment & Plan:   Casen Pryor is a 73 y.o. male Type 2 diabetes mellitus with chronic kidney disease, without long-term current use of insulin, unspecified CKD stage (HCC)  -Significant variability of postprandials, suspect component of diet.  Decided against med changes at this time, handout given on diabetes and nutrition.  Did recommend meeting with nutritionist, but declined at this time.  Repeat A1c 2 months  Belching  -Handout provided from up-to-date on multiple causes of belching/bloating or increased gas.  No red flag symptoms on exam/history.  Handout included FODMAPs/foods that may contribute to bloating or gas.  RTC precautions if persistent, sooner if worsening or other associated symptoms such as  nausea, vomiting, abdominal pain  Prominence of infraorbital region  -Has ophthalmology follow up due.  Plans to call and schedule this week.  Advised to discuss prominence with ophthalmic, but may be more cosmetic and to be discussed with plastic surgery.  Wife translating, understanding expressed  No orders of the defined types were placed in this encounter.  Patient Instructions    Variability in blood sugars after meals may be related to diet.  See information on diabetes and diet below.  Let me know if I can refer you to a nutritionist. I do recommend this.  Recheck in 2 months for repeat blood sugar test.   Call and schedule eye doctor appointment.  Prominent skin under the eyes does not appear to be infectious or concerning but  can discuss that area further with eye specialist.  See foods to avoid and information on handout that may cause gas or bloating.  If changes in diet do not help symptoms please return in the next few weeks to discuss further. Return to the clinic or go to the nearest emergency room if any of your symptoms worsen or new symptoms occur.   Diabetes Mellitus and Nutrition, Adult When you have diabetes (diabetes mellitus), it is very important to have healthy eating habits because your blood sugar (glucose) levels are greatly affected by what you eat and drink. Eating healthy foods in the appropriate amounts, at about the same times every day, can help you:  Control your blood glucose.  Lower your risk of heart disease.  Improve your blood pressure.  Reach or maintain a healthy weight. Every person with diabetes is different, and each person has different needs for a meal plan. Your health care provider may recommend that you work with a diet and nutrition specialist (dietitian) to make a meal plan that is best for you. Your meal plan may vary depending on factors such as:  The calories you need.  The medicines you take.  Your weight.  Your blood  glucose, blood pressure, and cholesterol levels.  Your activity level.  Other health conditions you have, such as heart or kidney disease. How do carbohydrates affect me? Carbohydrates, also called carbs, affect your blood glucose level more than any other type of food. Eating carbs naturally raises the amount of glucose in your blood. Carb counting is a method for keeping track of how many carbs you eat. Counting carbs is important to keep your blood glucose at a healthy level, especially if you use insulin or take certain oral diabetes medicines. It is important to know how many carbs you can safely have in each meal. This is different for every person. Your dietitian can help you calculate how many carbs you should have at each meal and for each snack. Foods that contain carbs include:  Bread, cereal, rice, pasta, and crackers.  Potatoes and corn.  Peas, beans, and lentils.  Milk and yogurt.  Fruit and juice.  Desserts, such as cakes, cookies, ice cream, and candy. How does alcohol affect me? Alcohol can cause a sudden decrease in blood glucose (hypoglycemia), especially if you use insulin or take certain oral diabetes medicines. Hypoglycemia can be a life-threatening condition. Symptoms of hypoglycemia (sleepiness, dizziness, and confusion) are similar to symptoms of having too much alcohol. If your health care provider says that alcohol is safe for you, follow these guidelines:  Limit alcohol intake to no more than 1 drink per day for nonpregnant women and 2 drinks per day for men. One drink equals 12 oz of beer, 5 oz of wine, or 1 oz of hard liquor.  Do not drink on an empty stomach.  Keep yourself hydrated with water, diet soda, or unsweetened iced tea.  Keep in mind that regular soda, juice, and other mixers may contain a lot of sugar and must be counted as carbs. What are tips for following this plan?  Reading food labels  Start by checking the serving size on the  "Nutrition Facts" label of packaged foods and drinks. The amount of calories, carbs, fats, and other nutrients listed on the label is based on one serving of the item. Many items contain more than one serving per package.  Check the total grams (g) of carbs in one serving. You can calculate the number of servings of carbs in one serving by dividing the total carbs by 15. For example, if a food has 30 g of total carbs, it would be equal to 2 servings of carbs.  Check the number of grams (g) of saturated and trans fats in one serving. Choose foods that have low or no amount of these fats.  Check the number of milligrams (mg) of salt (sodium) in one serving. Most people should limit total sodium intake to less than 2,300 mg per day.  Always check the nutrition information of foods labeled as "low-fat" or "nonfat". These foods may be higher in added sugar or refined carbs and should be avoided.  Talk to your dietitian to identify your daily goals for nutrients listed on the label. Shopping  Avoid buying canned, premade, or processed foods. These foods tend to be high in fat, sodium, and added sugar.  Shop around the outside edge of the grocery store. This includes fresh fruits and vegetables, bulk grains, fresh meats, and fresh dairy. Cooking  Use low-heat cooking methods, such as baking, instead of high-heat cooking methods like deep frying.  Cook using healthy oils, such as olive, canola, or sunflower oil.  Avoid cooking with butter,  cream, or high-fat meats. Meal planning  Eat meals and snacks regularly, preferably at the same times every day. Avoid going long periods of time without eating.  Eat foods high in fiber, such as fresh fruits, vegetables, beans, and whole grains. Talk to your dietitian about how many servings of carbs you can eat at each meal.  Eat 4-6 ounces (oz) of lean protein each day, such as lean meat, chicken, fish, eggs, or tofu. One oz of lean protein is equal  to: ? 1 oz of meat, chicken, or fish. ? 1 egg. ?  cup of tofu.  Eat some foods each day that contain healthy fats, such as avocado, nuts, seeds, and fish. Lifestyle  Check your blood glucose regularly.  Exercise regularly as told by your health care provider. This may include: ? 150 minutes of moderate-intensity or vigorous-intensity exercise each week. This could be brisk walking, biking, or water aerobics. ? Stretching and doing strength exercises, such as yoga or weightlifting, at least 2 times a week.  Take medicines as told by your health care provider.  Do not use any products that contain nicotine or tobacco, such as cigarettes and e-cigarettes. If you need help quitting, ask your health care provider.  Work with a Social worker or diabetes educator to identify strategies to manage stress and any emotional and social challenges. Questions to ask a health care provider  Do I need to meet with a diabetes educator?  Do I need to meet with a dietitian?  What number can I call if I have questions?  When are the best times to check my blood glucose? Where to find more information:  American Diabetes Association: diabetes.org  Academy of Nutrition and Dietetics: www.eatright.[employer] of Diabetes and Digestive and Kidney Diseases (NIH): [employer] Summary  A healthy meal plan will help you control your blood glucose and maintain a healthy lifestyle.  Working with a diet and nutrition specialist (dietitian) can help you make a meal plan that is best for you.  Keep in mind that carbohydrates (carbs) and alcohol have immediate effects on your blood glucose levels. It is important to count carbs and to use alcohol carefully. This information is not intended to replace advice given to you by your health care provider. Make sure you discuss any questions you have with your health care provider. Document Released: 03/25/2005 Document Revised: 01/26/2017 Document  Reviewed: 08/02/2016 Elsevier Interactive Patient Education  [employer].   If you have lab work done today you will be contacted with your lab results within the next 2 weeks.  If you have not heard from [place] then please contact us. The fastest way to get your results is to register for My Chart.   IF you received an x-ray today, you will receive an invoice from [facility]. Please contact [facility] at [phone] with questions or concerns regarding your invoice.   IF you received labwork today, you will receive an invoice from [facility]. Please contact [facility] at [phone] with questions or concerns regarding your invoice.   Our billing staff will not be able to assist you with questions regarding bills from these companies.  You will be contacted with the lab results as soon as they are available. The fastest way to get your results is to activate your My Chart account. Instructions are located on the last page of this paperwork. If you have not heard from [place] regarding the results in 2 weeks, please contact this office.  Signed,   Merri Ray, MD [facility].  07/27/18 9:32 PM

## 2018-07-25 NOTE — Patient Instructions (Addendum)
Variability in blood sugars after meals may be related to diet.  See information on diabetes and diet below.  Let me know if I can refer you to a nutritionist. I do recommend this.  Recheck in 2 months for repeat blood sugar test.   Call and schedule eye doctor appointment.  Prominent skin under the eyes does not appear to be infectious or concerning but  can discuss that area further with eye specialist.  See foods to avoid and information on handout that may cause gas or bloating.  If changes in diet do not help symptoms please return in the next few weeks to discuss further. Return to the clinic or go to the nearest emergency room if any of your symptoms worsen or new symptoms occur.   Diabetes Mellitus and Nutrition, Adult When you have diabetes (diabetes mellitus), it is very important to have healthy eating habits because your blood sugar (glucose) levels are greatly affected by what you eat and drink. Eating healthy foods in the appropriate amounts, at about the same times every day, can help you:  Control your blood glucose.  Lower your risk of heart disease.  Improve your blood pressure.  Reach or maintain a healthy weight. Every person with diabetes is different, and each person has different needs for a meal plan. Your health care provider may recommend that you work with a diet and nutrition specialist (dietitian) to make a meal plan that is best for you. Your meal plan may vary depending on factors such as:  The calories you need.  The medicines you take.  Your weight.  Your blood glucose, blood pressure, and cholesterol levels.  Your activity level.  Other health conditions you have, such as heart or kidney disease. How do carbohydrates affect me? Carbohydrates, also called carbs, affect your blood glucose level more than any other type of food. Eating carbs naturally raises the amount of glucose in your blood. Carb counting is a method for keeping track of how many  carbs you eat. Counting carbs is important to keep your blood glucose at a healthy level, especially if you use insulin or take certain oral diabetes medicines. It is important to know how many carbs you can safely have in each meal. This is different for every person. Your dietitian can help you calculate how many carbs you should have at each meal and for each snack. Foods that contain carbs include:  Bread, cereal, rice, pasta, and crackers.  Potatoes and corn.  Peas, beans, and lentils.  Milk and yogurt.  Fruit and juice.  Desserts, such as cakes, cookies, ice cream, and candy. How does alcohol affect me? Alcohol can cause a sudden decrease in blood glucose (hypoglycemia), especially if you use insulin or take certain oral diabetes medicines. Hypoglycemia can be a life-threatening condition. Symptoms of hypoglycemia (sleepiness, dizziness, and confusion) are similar to symptoms of having too much alcohol. If your health care provider says that alcohol is safe for you, follow these guidelines:  Limit alcohol intake to no more than 1 drink per day for nonpregnant women and 2 drinks per day for men. One drink equals 12 oz of beer, 5 oz of wine, or 1 oz of hard liquor.  Do not drink on an empty stomach.  Keep yourself hydrated with water, diet soda, or unsweetened iced tea.  Keep in mind that regular soda, juice, and other mixers may contain a lot of sugar and must be counted as carbs. What are tips for following  this plan?  Reading food labels  Start by checking the serving size on the "Nutrition Facts" label of packaged foods and drinks. The amount of calories, carbs, fats, and other nutrients listed on the label is based on one serving of the item. Many items contain more than one serving per package.  Check the total grams (g) of carbs in one serving. You can calculate the number of servings of carbs in one serving by dividing the total carbs by 15. For example, if a food has 30  g of total carbs, it would be equal to 2 servings of carbs.  Check the number of grams (g) of saturated and trans fats in one serving. Choose foods that have low or no amount of these fats.  Check the number of milligrams (mg) of salt (sodium) in one serving. Most people should limit total sodium intake to less than 2,300 mg per day.  Always check the nutrition information of foods labeled as "low-fat" or "nonfat". These foods may be higher in added sugar or refined carbs and should be avoided.  Talk to your dietitian to identify your daily goals for nutrients listed on the label. Shopping  Avoid buying canned, premade, or processed foods. These foods tend to be high in fat, sodium, and added sugar.  Shop around the outside edge of the grocery store. This includes fresh fruits and vegetables, bulk grains, fresh meats, and fresh dairy. Cooking  Use low-heat cooking methods, such as baking, instead of high-heat cooking methods like deep frying.  Cook using healthy oils, such as olive, canola, or sunflower oil.  Avoid cooking with butter, cream, or high-fat meats. Meal planning  Eat meals and snacks regularly, preferably at the same times every day. Avoid going long periods of time without eating.  Eat foods high in fiber, such as fresh fruits, vegetables, beans, and whole grains. Talk to your dietitian about how many servings of carbs you can eat at each meal.  Eat 4-6 ounces (oz) of lean protein each day, such as lean meat, chicken, fish, eggs, or tofu. One oz of lean protein is equal to: ? 1 oz of meat, chicken, or fish. ? 1 egg. ?  cup of tofu.  Eat some foods each day that contain healthy fats, such as avocado, nuts, seeds, and fish. Lifestyle  Check your blood glucose regularly.  Exercise regularly as told by your health care provider. This may include: ? 150 minutes of moderate-intensity or vigorous-intensity exercise each week. This could be brisk walking, biking, or water  aerobics. ? Stretching and doing strength exercises, such as yoga or weightlifting, at least 2 times a week.  Take medicines as told by your health care provider.  Do not use any products that contain nicotine or tobacco, such as cigarettes and e-cigarettes. If you need help quitting, ask your health care provider.  Work with a Social worker or diabetes educator to identify strategies to manage stress and any emotional and social challenges. Questions to ask a health care provider  Do I need to meet with a diabetes educator?  Do I need to meet with a dietitian?  What number can I call if I have questions?  When are the best times to check my blood glucose? Where to find more information:  American Diabetes Association: diabetes.org  Academy of Nutrition and Dietetics: www.eatright.[employer] of Diabetes and Digestive and Kidney Diseases (NIH): [employer] Summary  A healthy meal plan will help you control your blood glucose and  maintain a healthy lifestyle.  Working with a diet and nutrition specialist (dietitian) can help you make a meal plan that is best for you.  Keep in mind that carbohydrates (carbs) and alcohol have immediate effects on your blood glucose levels. It is important to count carbs and to use alcohol carefully. This information is not intended to replace advice given to you by your health care provider. Make sure you discuss any questions you have with your health care provider. Document Released: 03/25/2005 Document Revised: 01/26/2017 Document Reviewed: 08/02/2016 Elsevier Interactive Patient Education  [employer].   If you have lab work done today you will be contacted with your lab results within the next 2 weeks.  If you have not heard from [place] then please contact us. The fastest way to get your results is to register for My Chart.   IF you received an x-ray today, you will receive an invoice from [facility]. Please contact  [facility] at [phone] with questions or concerns regarding your invoice.   IF you received labwork today, you will receive an invoice from [facility]. Please contact [facility] at [phone] with questions or concerns regarding your invoice.   Our billing staff will not be able to assist you with questions regarding bills from these companies.  You will be contacted with the lab results as soon as they are available. The fastest way to get your results is to activate your My Chart account. Instructions are located on the last page of this paperwork. If you have not heard from [place] regarding the results in 2 weeks, please contact this office.

## 2018-07-27 ENCOUNTER — Encounter: Payer: Self-pay | Admitting: Family Medicine

## 2018-08-06 ENCOUNTER — Telehealth: Payer: Self-pay | Admitting: Family Medicine

## 2018-08-06 DIAGNOSIS — N183 Chronic kidney disease, stage 3 unspecified: Secondary | ICD-10-CM

## 2018-08-06 DIAGNOSIS — E1122 Type 2 diabetes mellitus with diabetic chronic kidney disease: Secondary | ICD-10-CM

## 2018-08-10 ENCOUNTER — Other Ambulatory Visit: Payer: Self-pay | Admitting: *Deleted

## 2018-08-10 ENCOUNTER — Other Ambulatory Visit: Payer: Self-pay | Admitting: Family Medicine

## 2018-08-10 DIAGNOSIS — N183 Chronic kidney disease, stage 3 unspecified: Secondary | ICD-10-CM

## 2018-08-10 DIAGNOSIS — E1122 Type 2 diabetes mellitus with diabetic chronic kidney disease: Secondary | ICD-10-CM

## 2018-08-10 MED ORDER — PIOGLITAZONE HCL 30 MG PO TABS: 30.0000 mg | ORAL_TABLET | Freq: Every day | ORAL | 0 refills | Status: DC

## 2018-08-10 NOTE — Telephone Encounter (Signed)
Per [place], pt only picked up Actos 30 mg #30 on 07/10/18. Sent rx in for #60 take 1 tablet daily.

## 2018-08-10 NOTE — Telephone Encounter (Signed)
Pt's wife calling to check on this.  States that pt only has one pill left.

## 2018-08-11 LAB — TB SKIN TEST
Induration: 0 mm
TB Skin Test: NEGATIVE

## 2018-08-11 NOTE — Addendum Note (Signed)
Addended by: Norton Blizzard R on: 9/73/3125 12:08 PM   Modules accepted: Orders

## 2018-08-17 ENCOUNTER — Encounter: Payer: Self-pay | Admitting: Gastroenterology

## 2018-09-19 ENCOUNTER — Encounter: Payer: Self-pay | Admitting: Family Medicine

## 2018-09-19 ENCOUNTER — Ambulatory Visit (INDEPENDENT_AMBULATORY_CARE_PROVIDER_SITE_OTHER): Payer: Medicare Other | Admitting: Family Medicine

## 2018-09-19 ENCOUNTER — Other Ambulatory Visit: Payer: Self-pay

## 2018-09-19 VITALS — BP 106/64 | HR 83 | Temp 98.3°F | Resp 14 | Ht 65.6 in | Wt 151.8 lb

## 2018-09-19 DIAGNOSIS — E1122 Type 2 diabetes mellitus with diabetic chronic kidney disease: Secondary | ICD-10-CM | POA: Diagnosis not present

## 2018-09-19 DIAGNOSIS — H5789 Other specified disorders of eye and adnexa: Secondary | ICD-10-CM

## 2018-09-19 NOTE — Progress Notes (Signed)
Subjective:    Patient ID: Adam Reid, male    DOB: [birth year], 73 y.o.   MRN: [id number]  HPI Adam Reid is a 73 y.o. male Presents today for: Chief Complaint  Patient presents with  . Diabetes    2 mo f/u on diabetes  . Facial Swelling    swelling under both eye was mention last visit and you told them to mention it this time when come in so you would have time to go over this matter. I see where you mention about going to a eye specialist i but a referral for optho below   Diabetes: See last office visit, significant variability of postprandial readings, suspected diet component.  We continue same med regimen, handout given on diabetes and nutrition, and I did recommend meeting with nutritionist but that was declined at that visit.  Plan for repeat A1c today. Microalbumin: July 2019, elevated at 724.  ACE inhibitor.  History of chronic kidney disease, followed by nephrology. Range 1.98-2.74 since 2018.   He is on statin. Treatment at this time 10 mg glipizide daily, Actos 30 mg daily. Optho, foot exam, pneumovax: Ophthalmology exam recommended last visit, plans to schedule.  UTD on foot exam and pneumovax. Home readings 137-140 fasting, 2hr PP - no recent checking.  Has been trying to adhere to diet better.   Wt Readings from Last 3 Encounters:  09/19/18 151 lb 12.8 oz (68.9 kg)  07/25/18 152 lb 6.4 oz (69.1 kg)  06/20/18 152 lb (68.9 kg)     Lab Results  Component Value Date   HGBA1C 7.6 (H) 06/20/2018   HGBA1C 7.4 (H) 01/23/2018   HGBA1C 6.9 (H) 10/05/2017   Lab Results  Component Value Date   MICROALBUR 60.3 09/02/2015   LDLCALC 76 06/20/2018   CREATININE 2.15 (H) 06/20/2018     Prominence of infraorbital region.: Discussed at office visit in January.  He had plan to schedule follow-up with ophthalmology at that visit and advised to discuss that problems with his ophthalmologist, but discussed it may be more cosmetic and potential meeting with plastic  surgery. Has not scheduled apt yet with optho - spouse sick after last visit, but plans to schedule.     Patient Active Problem List   Diagnosis Date Noted  . Secondary hyperparathyroidism, renal ([place]) 12/29/2016  . CAD (coronary artery disease), native coronary artery 08/02/2016  . Smoker 03/26/2016  . Hypotension 01/29/2016  . Abnormal myocardial perfusion study 01/29/2016  . Depression 12/09/2015  . Hyperlipidemia LDL goal <100 06/28/2014  . Diabetes mellitus with renal manifestations, controlled ([place])   . HTN (hypertension) 06/24/2014  . 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 BPH (benign prostatic hyperplasia) 06/24/2014   Past Medical History:  Diagnosis Date  . Chronic kidney disease   . Diabetes mellitus without complication ([place])   . Hyperlipidemia   . Hypertension   . Thyroid disease    was on supplement, taken off by [name]   Past Surgical History:  Procedure Laterality Date  . APPENDECTOMY    . CARDIAC CATHETERIZATION N/A 01/30/2016   Procedure: Left Heart Cath and Coronary Angiography;  Surgeon: [name], MD;  Location: [facility];  Service: Cardiovascular;  Laterality: N/A;   No Known Allergies Prior to Admission medications   Medication Sig Start Date End Date Taking? Authorizing Provider  aspirin EC 81 MG EC tablet Take 1 tablet (81 mg total) by mouth daily.  Patient taking differently: Take 81 mg by mouth every other day.  12/03/15  Yes Mayo, Pete Pelt, MD  atorvastatin (LIPITOR) 80 MG tablet TAKE 1 TABLET(80 MG) BY MOUTH DAILY 06/20/18  Yes [name], MD  Blood Glucose Monitoring Suppl (ONETOUCH VERIO) w/Device KIT USE UTD 09/03/15  Yes [provider]  calcitRIOL (ROCALTROL) 0.25 MCG capsule  09/09/17  Yes [provider]  Cholecalciferol (VITAMIN D-3 PO) Take 1 tablet by mouth See admin instructions. Three times a week. No specific days   Yes [provider]  citalopram (CELEXA) 20 MG tablet Take 1 tablet (20 mg total) by mouth daily. 06/20/18  Yes [name], MD  cyanocobalamin 500 MCG tablet Take 500 mcg by mouth daily. Reported on 12/02/2015   Yes [provider]  glipiZIDE (GLUCOTROL XL) 10 MG 24 hr tablet TAKE 1 TABLET BY MOUTH DAILY WITH BREAKFAST 07/06/18  Yes [name], MD  GlucoCom Lancets MISC Use for home glucose monitoring 09/02/15  Yes Jeffery, Chelle, PA  glucose blood (ONETOUCH VERIO) test strip USE ONCE DAILY 10/05/17  Yes Jeffery, Chelle, PA  metoprolol succinate (TOPROL-XL) 25 MG 24 hr tablet Take 1 tablet (25 mg total) by mouth daily. 06/20/18  Yes [name], MD  nitroGLYCERIN (NITROSTAT) 0.4 MG SL tablet Place 1 tablet (0.4 mg total) under the tongue every 5 (five) minutes x 3 doses as needed for chest pain. 01/31/16  Yes [name] E, NP  pioglitazone (ACTOS) 30 MG tablet TAKE 1 TABLET(30 MG) BY MOUTH DAILY 08/11/18  Yes [name], MD  tamsulosin (FLOMAX) 0.4 MG CAPS capsule TAKE 2 CAPSULES(0.8 MG) BY MOUTH DAILY 05/05/18  Yes [name], MD   Social History   Socioeconomic History  . Marital status: Married    Spouse name: [name]  . Number of children: 1  . Years of education: 12th grade  . Highest education level: Not on file  Occupational History  . Occupation: Retired-accountant  Social Needs  . Financial resource strain: Not on file  . Food insecurity:    Worry: Not on file    Inability: Not on file  . Transportation needs:    Medical: Not on file    Non-medical: Not on file  Tobacco Use  . Smoking status: Current Every Day Smoker    Packs/day: 0.25    Years: 48.00    Pack years: 12.00    Types: Cigarettes  . Smokeless tobacco: Never Used  . Tobacco comment: 09/07/16 3-4 cigs daily, trying to cut back  Substance and Sexual Activity  . Alcohol use: No    Alcohol/week: 0.0 standard drinks  . Drug use: No  . Sexual activity: Never    Partners: Female  Lifestyle    . Physical activity:    Days per week: Not on file    Minutes per session: Not on file  . Stress: Not on file  Relationships  . Social connections:    Talks on phone: Not on file    Gets together: Not on file    Attends religious service: Not on file    Active member of club or organization: Not on file    Attends meetings of clubs or organizations: Not on file    Relationship status: Not on file  . Intimate partner violence:    Fear of current or ex partner: Not on file    Emotionally abused: Not on file    Physically abused: Not on file  Forced sexual activity: Not on file  Other Topics Concern  . Not on file  Social History Narrative   Originally from [place]. Came to the [place] in 2009, following their son who came here for school.   Married.   Lives with his wife.   Their adult son lives in [place], [place], where he is in optometry school.   Education: High School   Exercise: No    Review of Systems  Constitutional: Negative for fatigue and unexpected weight change.  Eyes: Negative for visual disturbance.  Respiratory: Negative for cough, chest tightness and shortness of breath.   Cardiovascular: Negative for chest pain, palpitations and leg swelling.  Gastrointestinal: Negative for abdominal pain and blood in stool.  Neurological: Negative for dizziness, light-headedness and headaches.       Objective:   Physical Exam Vitals signs reviewed.  Constitutional:      Appearance: He is well-developed.  HENT:     Head: Normocephalic and atraumatic.  Eyes:     Pupils: Pupils are equal, round, and reactive to light.  Neck:     Vascular: No carotid bruit or JVD.  Cardiovascular:     Rate and Rhythm: Normal rate and regular rhythm.     Heart sounds: Normal heart sounds. No murmur.  Pulmonary:     Effort: Pulmonary effort is normal.     Breath sounds: Normal breath sounds. No rales.  Skin:    General: Skin is warm and dry.  Neurological:     Mental Status: He is alert  and oriented to person, place, and time.    Vitals:   09/19/18 1325  BP: 106/64  Pulse: 83  Resp: 14  Temp: 98.3 F (36.8 C)  TempSrc: Oral  SpO2: 99%  Weight: 151 lb 12.8 oz (68.9 kg)  Height: 5' 5.6" (1.666 m)       Assessment & Plan:   Adam Reid is a 73 y.o. male Eye swelling - Plan: Visual acuity screening  -Prominence of infraorbital tissue.  Actual eyes appear normal.  Recommend again that he discuss with ophthalmology, but appears to be cosmetic concern.  I can also refer to plastic surgery but declined at this time.  Type 2 diabetes mellitus with chronic kidney disease, without long-term current use of insulin, unspecified CKD stage ([place]) - Plan: Hemoglobin A1c, CANCELED: POCT glycosylated hemoglobin (Hb A1C), CANCELED: Lipid panel, CANCELED: TSH, CANCELED: CMP14+EGFR  -Check A1c, no change in meds at this time.  Can vary time of testing from fasting to 2 hours postprandial with 3-4 readings per week.  Recheck 3 months.    No orders of the defined types were placed in this encounter.  Patient Instructions    Call your eye care provider for diabetes eye screening test, but can discuss the prominent skin under the eyes with eye specialist. I am happy to refer you to plastic surgeon to evaluate that area as well. Let me know if you would like a referral, and I can place that without another office visit.   Continue to watch diet.  I will check diabetes test again today. No changes for now. Thanks for coming in today.   Diabetes Mellitus and Nutrition, Adult When you have diabetes (diabetes mellitus), it is very important to have healthy eating habits because your blood sugar (glucose) levels are greatly affected by what you eat and drink. Eating healthy foods in the appropriate amounts, at about the same times every day, can help you:  Control your blood glucose.  Lower your risk of heart disease.  Improve your blood pressure.  Reach or maintain a healthy  weight. Every person with diabetes is different, and each person has different needs for a meal plan. Your health care provider may recommend that you work with a diet and nutrition specialist (dietitian) to make a meal plan that is best for you. Your meal plan may vary depending on factors such as:  The calories you need.  The medicines you take.  Your weight.  Your blood glucose, blood pressure, and cholesterol levels.  Your activity level.  Other health conditions you have, such as heart or kidney disease. How do carbohydrates affect me? Carbohydrates, also called carbs, affect your blood glucose level more than any other type of food. Eating carbs naturally raises the amount of glucose in your blood. Carb counting is a method for keeping track of how many carbs you eat. Counting carbs is important to keep your blood glucose at a healthy level, especially if you use insulin or take certain oral diabetes medicines. It is important to know how many carbs you can safely have in each meal. This is different for every person. Your dietitian can help you calculate how many carbs you should have at each meal and for each snack. Foods that contain carbs include:  Bread, cereal, rice, pasta, and crackers.  Potatoes and corn.  Peas, beans, and lentils.  Milk and yogurt.  Fruit and juice.  Desserts, such as cakes, cookies, ice cream, and candy. How does alcohol affect me? Alcohol can cause a sudden decrease in blood glucose (hypoglycemia), especially if you use insulin or take certain oral diabetes medicines. Hypoglycemia can be a life-threatening condition. Symptoms of hypoglycemia (sleepiness, dizziness, and confusion) are similar to symptoms of having too much alcohol. If your health care provider says that alcohol is safe for you, follow these guidelines:  Limit alcohol intake to no more than 1 drink per day for nonpregnant women and 2 drinks per day for men. One drink equals 12 oz of  beer, 5 oz of wine, or 1 oz of hard liquor.  Do not drink on an empty stomach.  Keep yourself hydrated with water, diet soda, or unsweetened iced tea.  Keep in mind that regular soda, juice, and other mixers may contain a lot of sugar and must be counted as carbs. What are tips for following this plan?  Reading food labels  Start by checking the serving size on the "Nutrition Facts" label of packaged foods and drinks. The amount of calories, carbs, fats, and other nutrients listed on the label is based on one serving of the item. Many items contain more than one serving per package.  Check the total grams (g) of carbs in one serving. You can calculate the number of servings of carbs in one serving by dividing the total carbs by 15. For example, if a food has 30 g of total carbs, it would be equal to 2 servings of carbs.  Check the number of grams (g) of saturated and trans fats in one serving. Choose foods that have low or no amount of these fats.  Check the number of milligrams (mg) of salt (sodium) in one serving. Most people should limit total sodium intake to less than 2,300 mg per day.  Always check the nutrition information of foods labeled as "low-fat" or "nonfat". These foods may be higher in added sugar or refined carbs and should be avoided.  Talk to your dietitian to identify  your daily goals for nutrients listed on the label. Shopping  Avoid buying canned, premade, or processed foods. These foods tend to be high in fat, sodium, and added sugar.  Shop around the outside edge of the grocery store. This includes fresh fruits and vegetables, bulk grains, fresh meats, and fresh dairy. Cooking  Use low-heat cooking methods, such as baking, instead of high-heat cooking methods like deep frying.  Cook using healthy oils, such as olive, canola, or sunflower oil.  Avoid cooking with butter, cream, or high-fat meats. Meal planning  Eat meals and snacks regularly, preferably at  the same times every day. Avoid going long periods of time without eating.  Eat foods high in fiber, such as fresh fruits, vegetables, beans, and whole grains. Talk to your dietitian about how many servings of carbs you can eat at each meal.  Eat 4-6 ounces (oz) of lean protein each day, such as lean meat, chicken, fish, eggs, or tofu. One oz of lean protein is equal to: ? 1 oz of meat, chicken, or fish. ? 1 egg. ?  cup of tofu.  Eat some foods each day that contain healthy fats, such as avocado, nuts, seeds, and fish. Lifestyle  Check your blood glucose regularly.  Exercise regularly as told by your health care provider. This may include: ? 150 minutes of moderate-intensity or vigorous-intensity exercise each week. This could be brisk walking, biking, or water aerobics. ? Stretching and doing strength exercises, such as yoga or weightlifting, at least 2 times a week.  Take medicines as told by your health care provider.  Do not use any products that contain nicotine or tobacco, such as cigarettes and e-cigarettes. If you need help quitting, ask your health care provider.  Work with a Social worker or diabetes educator to identify strategies to manage stress and any emotional and social challenges. Questions to ask a health care provider  Do I need to meet with a diabetes educator?  Do I need to meet with a dietitian?  What number can I call if I have questions?  When are the best times to check my blood glucose? Where to find more information:  American Diabetes Association: diabetes.org  Academy of Nutrition and Dietetics: www.eatright.[employer] of Diabetes and Digestive and Kidney Diseases (NIH): [employer] Summary  A healthy meal plan will help you control your blood glucose and maintain a healthy lifestyle.  Working with a diet and nutrition specialist (dietitian) can help you make a meal plan that is best for you.  Keep in mind that carbohydrates  (carbs) and alcohol have immediate effects on your blood glucose levels. It is important to count carbs and to use alcohol carefully. This information is not intended to replace advice given to you by your health care provider. Make sure you discuss any questions you have with your health care provider. Document Released: 03/25/2005 Document Revised: 01/26/2017 Document Reviewed: 08/02/2016 Elsevier Interactive Patient Education  [employer].     If you have lab work done today you will be contacted with your lab results within the next 2 weeks.  If you have not heard from [place] then please contact us. The fastest way to get your results is to register for My Chart.   IF you received an x-ray today, you will receive an invoice from [facility]. Please contact [facility] at [phone] with questions or concerns regarding your invoice.   IF you received labwork today, you will receive an invoice from [facility] [employer] [facility].  Please contact [facility] at [phone] with questions or concerns regarding your invoice.   Our billing staff will not be able to assist you with questions regarding bills from these companies.  You will be contacted with the lab results as soon as they are available. The fastest way to get your results is to activate your My Chart account. Instructions are located on the last page of this paperwork. If you have not heard from [place] regarding the results in 2 weeks, please contact this office.       Signed,   Merri Ray, MD [facility].  09/19/18 2:17 PM

## 2018-09-19 NOTE — Patient Instructions (Addendum)
Call your eye care provider for diabetes eye screening test, but can discuss the prominent skin under the eyes with eye specialist. I am happy to refer you to plastic surgeon to evaluate that area as well. Let me know if you would like a referral, and I can place that without another office visit.   Continue to watch diet.  I will check diabetes test again today. No changes for now. Thanks for coming in today.   Diabetes Mellitus and Nutrition, Adult When you have diabetes (diabetes mellitus), it is very important to have healthy eating habits because your blood sugar (glucose) levels are greatly affected by what you eat and drink. Eating healthy foods in the appropriate amounts, at about the same times every day, can help you:  Control your blood glucose.  Lower your risk of heart disease.  Improve your blood pressure.  Reach or maintain a healthy weight. Every person with diabetes is different, and each person has different needs for a meal plan. Your health care provider may recommend that you work with a diet and nutrition specialist (dietitian) to make a meal plan that is best for you. Your meal plan may vary depending on factors such as:  The calories you need.  The medicines you take.  Your weight.  Your blood glucose, blood pressure, and cholesterol levels.  Your activity level.  Other health conditions you have, such as heart or kidney disease. How do carbohydrates affect me? Carbohydrates, also called carbs, affect your blood glucose level more than any other type of food. Eating carbs naturally raises the amount of glucose in your blood. Carb counting is a method for keeping track of how many carbs you eat. Counting carbs is important to keep your blood glucose at a healthy level, especially if you use insulin or take certain oral diabetes medicines. It is important to know how many carbs you can safely have in each meal. This is different for every person. Your dietitian  can help you calculate how many carbs you should have at each meal and for each snack. Foods that contain carbs include:  Bread, cereal, rice, pasta, and crackers.  Potatoes and corn.  Peas, beans, and lentils.  Milk and yogurt.  Fruit and juice.  Desserts, such as cakes, cookies, ice cream, and candy. How does alcohol affect me? Alcohol can cause a sudden decrease in blood glucose (hypoglycemia), especially if you use insulin or take certain oral diabetes medicines. Hypoglycemia can be a life-threatening condition. Symptoms of hypoglycemia (sleepiness, dizziness, and confusion) are similar to symptoms of having too much alcohol. If your health care provider says that alcohol is safe for you, follow these guidelines:  Limit alcohol intake to no more than 1 drink per day for nonpregnant women and 2 drinks per day for men. One drink equals 12 oz of beer, 5 oz of wine, or 1 oz of hard liquor.  Do not drink on an empty stomach.  Keep yourself hydrated with water, diet soda, or unsweetened iced tea.  Keep in mind that regular soda, juice, and other mixers may contain a lot of sugar and must be counted as carbs. What are tips for following this plan?  Reading food labels  Start by checking the serving size on the "Nutrition Facts" label of packaged foods and drinks. The amount of calories, carbs, fats, and other nutrients listed on the label is based on one serving of the item. Many items contain more than one serving per package.  Check the total grams (g) of carbs in one serving. You can calculate the number of servings of carbs in one serving by dividing the total carbs by 15. For example, if a food has 30 g of total carbs, it would be equal to 2 servings of carbs.  Check the number of grams (g) of saturated and trans fats in one serving. Choose foods that have low or no amount of these fats.  Check the number of milligrams (mg) of salt (sodium) in one serving. Most people should  limit total sodium intake to less than 2,300 mg per day.  Always check the nutrition information of foods labeled as "low-fat" or "nonfat". These foods may be higher in added sugar or refined carbs and should be avoided.  Talk to your dietitian to identify your daily goals for nutrients listed on the label. Shopping  Avoid buying canned, premade, or processed foods. These foods tend to be high in fat, sodium, and added sugar.  Shop around the outside edge of the grocery store. This includes fresh fruits and vegetables, bulk grains, fresh meats, and fresh dairy. Cooking  Use low-heat cooking methods, such as baking, instead of high-heat cooking methods like deep frying.  Cook using healthy oils, such as olive, canola, or sunflower oil.  Avoid cooking with butter, cream, or high-fat meats. Meal planning  Eat meals and snacks regularly, preferably at the same times every day. Avoid going long periods of time without eating.  Eat foods high in fiber, such as fresh fruits, vegetables, beans, and whole grains. Talk to your dietitian about how many servings of carbs you can eat at each meal.  Eat 4-6 ounces (oz) of lean protein each day, such as lean meat, chicken, fish, eggs, or tofu. One oz of lean protein is equal to: ? 1 oz of meat, chicken, or fish. ? 1 egg. ?  cup of tofu.  Eat some foods each day that contain healthy fats, such as avocado, nuts, seeds, and fish. Lifestyle  Check your blood glucose regularly.  Exercise regularly as told by your health care provider. This may include: ? 150 minutes of moderate-intensity or vigorous-intensity exercise each week. This could be brisk walking, biking, or water aerobics. ? Stretching and doing strength exercises, such as yoga or weightlifting, at least 2 times a week.  Take medicines as told by your health care provider.  Do not use any products that contain nicotine or tobacco, such as cigarettes and e-cigarettes. If you need help  quitting, ask your health care provider.  Work with a Social worker or diabetes educator to identify strategies to manage stress and any emotional and social challenges. Questions to ask a health care provider  Do I need to meet with a diabetes educator?  Do I need to meet with a dietitian?  What number can I call if I have questions?  When are the best times to check my blood glucose? Where to find more information:  American Diabetes Association: diabetes.org  Academy of Nutrition and Dietetics: www.eatright.[employer] of Diabetes and Digestive and Kidney Diseases (NIH): [employer] Summary  A healthy meal plan will help you control your blood glucose and maintain a healthy lifestyle.  Working with a diet and nutrition specialist (dietitian) can help you make a meal plan that is best for you.  Keep in mind that carbohydrates (carbs) and alcohol have immediate effects on your blood glucose levels. It is important to count carbs and to use alcohol carefully. This  information is not intended to replace advice given to you by your health care provider. Make sure you discuss any questions you have with your health care provider. Document Released: 03/25/2005 Document Revised: 01/26/2017 Document Reviewed: 08/02/2016 Elsevier Interactive Patient Education  [employer].     If you have lab work done today you will be contacted with your lab results within the next 2 weeks.  If you have not heard from [place] then please contact us. The fastest way to get your results is to register for My Chart.   IF you received an x-ray today, you will receive an invoice from [facility] Rad[facility]. Please contact [facility] Radiology at [phone] with questions or concerns regarding your invoice.   IF you received labwork today, you will receive an invoice from [facility]. Please contact [facility] at [phone] with questions or concerns regarding your invoice.   Our billing  staff will not be able to assist you with questions regarding bills from these companies.  You will be contacted with the lab results as soon as they are available. The fastest way to get your results is to activate your My Chart account. Instructions are located on the last page of this paperwork. If you have not heard from [place] regarding the results in 2 weeks, please contact this office.

## 2018-09-20 LAB — HEMOGLOBIN A1C
Est. average glucose Bld gHb Est-mCnc: 180 mg/dL
Hgb A1c MFr Bld: 7.9 % — ABNORMAL HIGH (ref 4.8–5.6)

## 2018-10-06 ENCOUNTER — Other Ambulatory Visit: Payer: Self-pay | Admitting: Family Medicine

## 2018-10-06 DIAGNOSIS — E1122 Type 2 diabetes mellitus with diabetic chronic kidney disease: Secondary | ICD-10-CM

## 2018-10-06 DIAGNOSIS — N183 Chronic kidney disease, stage 3 unspecified: Secondary | ICD-10-CM

## 2018-11-28 ENCOUNTER — Telehealth: Payer: Self-pay | Admitting: Family Medicine

## 2018-11-28 NOTE — Telephone Encounter (Signed)
Copied from [id number]. Topic: General - Inquiry >> Nov 28, 2018  3:06 PM Yvette Rack wrote: Reason for CRM: [place] called in regarding the CMN form that was faxed on 11/20/18. [place] request form to be completed and faxed back to [phone] so patient can get Rx for diabetic test strips filled.

## 2018-12-07 NOTE — Telephone Encounter (Signed)
Called pharmacy and spoke to [place]. Verbal given and he is able to get test strips filled.  Thanks, [employer]

## 2018-12-14 ENCOUNTER — Telehealth: Payer: Self-pay | Admitting: Family Medicine

## 2018-12-14 NOTE — Telephone Encounter (Signed)
Copied from [place] [id number]. Topic: Quick Communication - Rx Refill/Question >> Dec 14, 2018 11:38 AM Alanda Slim E wrote: Medication: [facility] health plans called and stated they received a PA for blood glucose test strip but they dont know which ones and needs a name/ please advise  Call - [phone] Ref Rx case# [id number]

## 2018-12-16 ENCOUNTER — Other Ambulatory Visit: Payer: Self-pay | Admitting: Family Medicine

## 2018-12-16 DIAGNOSIS — E785 Hyperlipidemia, unspecified: Secondary | ICD-10-CM

## 2018-12-16 NOTE — Telephone Encounter (Signed)
Requested Prescriptions  Pending Prescriptions Disp Refills  . atorvastatin (LIPITOR) 80 MG tablet [Pharmacy Med Name: ATORVASTATIN 80MG  TABLETS] 90 tablet 0    Sig: TAKE 1 TABLET(80 MG) BY MOUTH DAILY     Cardiovascular:  Antilipid - Statins Passed - 12/16/2018  3:31 PM      Passed - Total Cholesterol in normal range and within 360 days    Cholesterol, Total  Date Value Ref Range Status  06/20/2018 179 100 - 199 mg/dL Final         Passed - LDL in normal range and within 360 days    LDL Calculated  Date Value Ref Range Status  06/20/2018 76 0 - 99 mg/dL Final         Passed - HDL in normal range and within 360 days    HDL  Date Value Ref Range Status  06/20/2018 83 >39 mg/dL Final         Passed - Triglycerides in normal range and within 360 days    Triglycerides  Date Value Ref Range Status  06/20/2018 98 0 - 149 mg/dL Final         Passed - Patient is not pregnant      Passed - Valid encounter within last 12 months    Recent Outpatient Visits          2 months ago Eye swelling   [facility], Ranell Patrick, MD   4 months ago Type 2 diabetes mellitus with chronic kidney disease, without long-term current use of insulin, unspecified CKD stage [facility])   [facility], Ranell Patrick, MD   5 months ago PPD screening test   [facility], Laurey Arrow, MD   5 months ago PPD screening test   [facility] at [facility], Laurey Arrow, MD   5 months ago Type 2 diabetes mellitus with stage 4 chronic kidney disease, without long-term current use of insulin [facility])   [facility], Ranell Patrick, MD      Future Appointments            In 3 days [name], MD [facility] [place], [facility]

## 2018-12-19 ENCOUNTER — Other Ambulatory Visit: Payer: Self-pay

## 2018-12-19 ENCOUNTER — Encounter: Payer: Self-pay | Admitting: Family Medicine

## 2018-12-19 ENCOUNTER — Ambulatory Visit (INDEPENDENT_AMBULATORY_CARE_PROVIDER_SITE_OTHER): Payer: Medicare Other | Admitting: Family Medicine

## 2018-12-19 VITALS — BP 102/76 | HR 97 | Temp 98.8°F | Resp 14 | Wt 144.0 lb

## 2018-12-19 DIAGNOSIS — E1122 Type 2 diabetes mellitus with diabetic chronic kidney disease: Secondary | ICD-10-CM

## 2018-12-19 DIAGNOSIS — R42 Dizziness and giddiness: Secondary | ICD-10-CM

## 2018-12-19 DIAGNOSIS — E785 Hyperlipidemia, unspecified: Secondary | ICD-10-CM | POA: Diagnosis not present

## 2018-12-19 DIAGNOSIS — K5900 Constipation, unspecified: Secondary | ICD-10-CM

## 2018-12-19 DIAGNOSIS — R739 Hyperglycemia, unspecified: Secondary | ICD-10-CM

## 2018-12-19 LAB — POCT GLYCOSYLATED HEMOGLOBIN (HGB A1C): Hemoglobin A1C: 8 % — AB (ref 4.0–5.6)

## 2018-12-19 LAB — GLUCOSE, POCT (MANUAL RESULT ENTRY): POC Glucose: 187 mg/dl — AB (ref 70–99)

## 2018-12-19 MED ORDER — GLUCOSE BLOOD VI STRP: ORAL_STRIP | 99 refills | Status: DC

## 2018-12-19 NOTE — Patient Instructions (Addendum)
A1c similar to last visit. continue same meds for now. I would like to look at other options for you and will let you know about changes.   Please call and check into scheduling colonoscopy, and reschedule eye doctor appointment.  Can check blood sugar once daily -  either FASTING or 2 HOURS after meals. Ok to check at other times of you do not feel well or concern of it running too high or low.   Make sure to drink water during the day, but if dizziness returns check your blood sugar and be seen if those symptoms continue.  Return to the clinic or go to the nearest emergency room if any of your symptoms worsen or new symptoms occur.  Increase water and fiber in diet for constipation. Colace stool softener if needed over the counter. See other information below. Recheck if not improving in next 2 weeks.    Constipation, Adult Constipation is when a person has fewer bowel movements in a week than normal, has difficulty having a bowel movement, or has stools that are dry, hard, or larger than normal. Constipation may be caused by an underlying condition. It may become worse with age if a person takes certain medicines and does not take in enough fluids. Follow these instructions at home: Eating and drinking   Eat foods that have a lot of fiber, such as fresh fruits and vegetables, whole grains, and beans.  Limit foods that are high in fat, low in fiber, or overly processed, such as french fries, hamburgers, cookies, candies, and soda.  Drink enough fluid to keep your urine clear or pale yellow. General instructions  Exercise regularly or as told by your health care provider.  Go to the restroom when you have the urge to go. Do not hold it in.  Take over-the-counter and prescription medicines only as told by your health care provider. These include any fiber supplements.  Practice pelvic floor retraining exercises, such as deep breathing while relaxing the lower abdomen and pelvic floor  relaxation during bowel movements.  Watch your condition for any changes.  Keep all follow-up visits as told by your health care provider. This is important. Contact a health care provider if:  You have pain that gets worse.  You have a fever.  You do not have a bowel movement after 4 days.  You vomit.  You are not hungry.  You lose weight.  You are bleeding from the anus.  You have thin, pencil-like stools. Get help right away if:  You have a fever and your symptoms suddenly get worse.  You leak stool or have blood in your stool.  Your abdomen is bloated.  You have severe pain in your abdomen.  You feel dizzy or you faint. This information is not intended to replace advice given to you by your health care provider. Make sure you discuss any questions you have with your health care provider. Document Released: 03/26/2004 Document Revised: 01/16/2016 Document Reviewed: 12/17/2015 Elsevier Interactive Patient Education  [employer].    If you have lab work done today you will be contacted with your lab results within the next 2 weeks.  If you have not heard from [place] then please contact us. The fastest way to get your results is to register for My Chart.   IF you received an x-ray today, you will receive an invoice from [facility] Rad[facility]. Please contact [facility] Radiology at [phone] with questions or concerns regarding your invoice.   IF  you received labwork today, you will receive an invoice from [facility]. Please contact [facility] at [phone] with questions or concerns regarding your invoice.   Our billing staff will not be able to assist you with questions regarding bills from these companies.  You will be contacted with the lab results as soon as they are available. The fastest way to get your results is to activate your My Chart account. Instructions are located on the last page of this paperwork. If you have not heard from [place] regarding the results  in 2 weeks, please contact this office.

## 2018-12-19 NOTE — Progress Notes (Signed)
Subjective:    Patient ID: Adam Reid, male    DOB: 05/11/1946, 73 y.o.   MRN: [id number]  HPI Adam Reid is a 73 y.o. male Presents today for: Chief Complaint  Patient presents with  . Diabetes    3 Month f/u on diabetes. Checked blood sugar 3-4 days ago blood sugar was 140. Have not checked BS today.Patient was last seen on 09/19/18. No changes to his diabetes at that time. A1c at that time was 7.9   Here with wife who is translating.   Diabetes: Complicated by chronic kidney disease, microalbuminuria Goal of less than 7.5, recommended home testing with 2-hour postprandials as well as fastings for more information, but improved diet adherence to see if that would improve readings today.  Next option of medications would be likely insulin given chronic kidney disease. Currently on Glucotrol XL 10 mg daily, Actos 30 mg daily.  Microalbumin: 724 in July 2019 Optho, foot exam, pneumovax: Due for ophthalmology exam (cancelled due to pandemic, plans to reschedule).  up-to-date on foot exam and Pneumovax.  Colonoscopy also appears to be due from health maintenance standpoint, last 1 recorded January 2017 with repeat 3 years recommended. Has been watching diet - weight down from last visit - has been eating less. lipitor for statin.  Rare dizziness - not associated with certain activity, mild symptoms. Occurs 1-2 times per week. Few seconds only.  Has had some issues with constipation. Every other day. Hard stool. No abd pain.   Home readings: Fasting: 130-140 2hr PP: 160-170  Wt Readings from Last 3 Encounters:  12/19/18 144 lb (65.3 kg)  09/19/18 151 lb 12.8 oz (68.9 kg)  07/25/18 152 lb 6.4 oz (69.1 kg)     Lab Results  Component Value Date   HGBA1C 7.9 (H) 09/19/2018   HGBA1C 7.6 (H) 06/20/2018   HGBA1C 7.4 (H) 01/23/2018   Lab Results  Component Value Date   MICROALBUR 60.3 09/02/2015   LDLCALC 76 06/20/2018   CREATININE 2.15 (H) 06/20/2018  Nephrologist is  Dr. [name] with office visit in December 2019.  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  Access placement planned when GFR less than 20.  Last GFR 30 in December.    Hyperlipidemia:  Lab Results  Component Value Date   CHOL 179 06/20/2018   HDL 83 06/20/2018   LDLCALC 76 06/20/2018   TRIG 98 06/20/2018   CHOLHDL 2.2 06/20/2018   Lab Results  Component Value Date   ALT 15 06/20/2018   AST 15 06/20/2018   ALKPHOS 101 06/20/2018   BILITOT 0.2 06/20/2018  no new myalgias/side effects with Lipitor. Near goal of LDL less than 70 in 06/2018.     Patient Active Problem List   Diagnosis Date Noted  . Secondary hyperparathyroidism, renal ([place]) 12/29/2016  . CAD (coronary artery disease), native coronary artery 08/02/2016  . Smoker 03/26/2016  . Hypotension 01/29/2016  . Abnormal myocardial perfusion study 01/29/2016  . Depression 12/09/2015  . Hyperlipidemia LDL goal <100 06/28/2014  . Diabetes mellitus with renal manifestations, controlled ([place])   . HTN (hypertension) 06/24/2014  . 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 BPH (benign prostatic hyperplasia) 06/24/2014   Past Medical History:  Diagnosis Date  . Chronic kidney disease   . Diabetes mellitus without complication ([place])   . Hyperlipidemia   . Hypertension   . Thyroid disease    was on supplement, taken off by [name]  Past Surgical History:  Procedure Laterality Date  . APPENDECTOMY    . CARDIAC CATHETERIZATION N/A 01/30/2016   Procedure: Left Heart Cath and Coronary Angiography;  Surgeon: [name], MD;  Location: [place] [facility];  Service: Cardiovascular;  Laterality: N/A;   No Known Allergies Prior to Admission medications   Medication Sig Start Date End Date Taking? Authorizing Provider  aspirin EC 81 MG EC tablet Take 1 tablet (81 mg total) by mouth daily. Patient taking differently: Take 81 mg by mouth every other day.  12/03/15  Yes Mayo, Pete Pelt,  MD  atorvastatin (LIPITOR) 80 MG tablet TAKE 1 TABLET(80 MG) BY MOUTH DAILY 12/16/18  Yes [name], MD  Blood Glucose Monitoring Suppl (ONETOUCH VERIO) w/Device KIT USE UTD 09/03/15  Yes [provider]  calcitRIOL (ROCALTROL) 0.25 MCG capsule  09/09/17  Yes [provider]  Cholecalciferol (VITAMIN D-3 PO) Take 1 tablet by mouth See admin instructions. Three times a week. No specific days   Yes [provider]  citalopram (CELEXA) 20 MG tablet Take 1 tablet (20 mg total) by mouth daily. 06/20/18  Yes [name], MD  cyanocobalamin 500 MCG tablet Take 500 mcg by mouth daily. Reported on 12/02/2015   Yes [provider]  glipiZIDE (GLUCOTROL XL) 10 MG 24 hr tablet TAKE 1 TABLET BY MOUTH DAILY WITH BREAKFAST 07/06/18  Yes [name], MD  GlucoCom Lancets MISC Use for home glucose monitoring 09/02/15  Yes Jeffery, [place], PA  glucose blood (ONETOUCH VERIO) test strip USE ONCE DAILY 12/19/18  Yes [name], MD  metoprolol succinate (TOPROL-XL) 25 MG 24 hr tablet Take 1 tablet (25 mg total) by mouth daily. 06/20/18  Yes [name], MD  nitroGLYCERIN (NITROSTAT) 0.4 MG SL tablet Place 1 tablet (0.4 mg total) under the tongue every 5 (five) minutes x 3 doses as needed for chest pain. 01/31/16  Yes [name] E, NP  pioglitazone (ACTOS) 30 MG tablet TAKE 1 TABLET(30 MG) BY MOUTH DAILY 08/11/18  Yes [name], MD  tamsulosin (FLOMAX) 0.4 MG CAPS capsule TAKE 2 CAPSULES(0.8 MG) BY MOUTH DAILY 05/05/18  Yes [name], MD   Social History   Socioeconomic History  . Marital status: Married    Spouse name: [name]  . Number of children: 1  . Years of education: 12th grade  . Highest education level: Not on file  Occupational History  . Occupation: Retired-accountant  Social Needs  . Financial resource strain: Not on file  . Food insecurity:    Worry: Not on file    Inability: Not on file  . Transportation needs:     Medical: Not on file    Non-medical: Not on file  Tobacco Use  . Smoking status: Current Every Day Smoker    Packs/day: 0.25    Years: 48.00    Pack years: 12.00    Types: Cigarettes  . Smokeless tobacco: Never Used  . Tobacco comment: 09/07/16 3-4 cigs daily, trying to cut back  Substance and Sexual Activity  . Alcohol use: No    Alcohol/week: 0.0 standard drinks  . Drug use: No  . Sexual activity: Never    Partners: Female  Lifestyle  . Physical activity:    Days per week: Not on file    Minutes per session: Not on file  . Stress: Not on file  Relationships  . Social connections:    Talks on phone: Not on file  Gets together: Not on file    Attends religious service: Not on file    Active member of club or organization: Not on file    Attends meetings of clubs or organizations: Not on file    Relationship status: Not on file  . Intimate partner violence:    Fear of current or ex partner: Not on file    Emotionally abused: Not on file    Physically abused: Not on file    Forced sexual activity: Not on file  Other Topics Concern  . Not on file  Social History Narrative   Originally from [place]. Came to the [place] in 2009, following their son who came here for school.   Married.   Lives with his wife.   Their adult son lives in [place], [place], where he is in optometry school.   Education: High School   Exercise: No    Review of Systems  Constitutional: Negative for fatigue and unexpected weight change.  Eyes: Negative for visual disturbance.  Respiratory: Negative for cough, chest tightness and shortness of breath.   Cardiovascular: Negative for chest pain, palpitations and leg swelling.  Gastrointestinal: Negative for abdominal pain and blood in stool.  Neurological: Positive for dizziness (rare. ). Negative for light-headedness and headaches.   Per HPI.     Objective:   Physical Exam Vitals signs reviewed.  Constitutional:      Appearance: He is well-developed.   HENT:     Head: Normocephalic and atraumatic.  Eyes:     Pupils: Pupils are equal, round, and reactive to light.  Neck:     Vascular: No carotid bruit or JVD.  Cardiovascular:     Rate and Rhythm: Normal rate and regular rhythm.     Heart sounds: Normal heart sounds. No murmur.  Pulmonary:     Effort: Pulmonary effort is normal.     Breath sounds: Normal breath sounds. No rales.  Skin:    General: Skin is warm and dry.  Neurological:     Mental Status: He is alert and oriented to person, place, and time.    Abdomen nt, no distension.  Vitals:   12/19/18 1109  BP: 102/76  Pulse: 97  Resp: 14  Temp: 98.8 F (37.1 C)  TempSrc: Oral  SpO2: 100%  Weight: 144 lb (65.3 kg)    Results for orders placed or performed in visit on 12/19/18  POCT glycosylated hemoglobin (Hb A1C)  Result Value Ref Range   Hemoglobin A1C 8.0 (A) 4.0 - 5.6 %   HbA1c POC (<> result, manual entry)     HbA1c, POC (prediabetic range)     HbA1c, POC (controlled diabetic range)    POCT glucose (manual entry)  Result Value Ref Range   POC Glucose 187 (A) 70 - 99 mg/dl      Assessment & Plan:

## 2018-12-20 LAB — COMPREHENSIVE METABOLIC PANEL
ALT: 19 IU/L (ref 0–44)
AST: 17 IU/L (ref 0–40)
Albumin/Globulin Ratio: 1.8 (ref 1.2–2.2)
Albumin: 4.8 g/dL — ABNORMAL HIGH (ref 3.7–4.7)
Alkaline Phosphatase: 97 IU/L (ref 39–117)
BUN/Creatinine Ratio: 25 — ABNORMAL HIGH (ref 10–24)
BUN: 61 mg/dL — ABNORMAL HIGH (ref 8–27)
Bilirubin Total: 0.3 mg/dL (ref 0.0–1.2)
CO2: 21 mmol/L (ref 20–29)
Calcium: 9.9 mg/dL (ref 8.6–10.2)
Chloride: 99 mmol/L (ref 96–106)
Creatinine, Ser: 2.44 mg/dL — ABNORMAL HIGH (ref 0.76–1.27)
GFR calc Af Amer: 30 mL/min/{1.73_m2} — ABNORMAL LOW (ref >59)
GFR calc non Af Amer: 26 mL/min/{1.73_m2} — ABNORMAL LOW (ref >59)
Globulin, Total: 2.6 g/dL (ref 1.5–4.5)
Glucose: 184 mg/dL — ABNORMAL HIGH (ref 65–99)
Potassium: 4.4 mmol/L (ref 3.5–5.2)
Sodium: 138 mmol/L (ref 134–144)
Total Protein: 7.4 g/dL (ref 6.0–8.5)

## 2018-12-20 LAB — LIPID PANEL
Chol/HDL Ratio: 2.4 ratio (ref 0.0–5.0)
Cholesterol, Total: 211 mg/dL — ABNORMAL HIGH (ref 100–199)
HDL: 87 mg/dL (ref >39)
LDL Calculated: 98 mg/dL (ref 0–99)
Triglycerides: 129 mg/dL (ref 0–149)
VLDL Cholesterol Cal: 26 mg/dL (ref 5–40)

## 2018-12-21 NOTE — Telephone Encounter (Signed)
Wife would like to talk to someone about getting this medication refill. She said that his insurance should cover it because she called them and they said he was covered therefore she is not sure why Dr. [name] denied it and won't refill it for patient. Please call patient or wife back, thanks.

## 2018-12-22 NOTE — Telephone Encounter (Signed)
Called the number [phone] to do a second appeal through [employer]. Left a VM to call back.

## 2018-12-22 NOTE — Telephone Encounter (Signed)
[facility], the medication was denied. I did an appeal and it was still denied. They said that I have to do a 2nd appeal under another telephone number. And there are no alternatives at this time.

## 2018-12-26 ENCOUNTER — Other Ambulatory Visit: Payer: Self-pay | Admitting: Family Medicine

## 2018-12-26 DIAGNOSIS — F329 Major depressive disorder, single episode, unspecified: Secondary | ICD-10-CM

## 2018-12-26 DIAGNOSIS — F32A Depression, unspecified: Secondary | ICD-10-CM

## 2018-12-26 NOTE — Telephone Encounter (Signed)
Patient's wife Adam Reid calling to find out what the patient should do in the meantime without strips?

## 2018-12-26 NOTE — Telephone Encounter (Signed)
Please advise 

## 2018-12-27 NOTE — Telephone Encounter (Signed)
[facility] called in again and stated medicare will not pay for pt test strips until the CMN form is filled out and faxed back.  She is going to fax it today and needs to be sent back to [phone]

## 2019-01-02 ENCOUNTER — Other Ambulatory Visit: Payer: Self-pay | Admitting: Family Medicine

## 2019-01-02 DIAGNOSIS — E1122 Type 2 diabetes mellitus with diabetic chronic kidney disease: Secondary | ICD-10-CM

## 2019-01-02 DIAGNOSIS — N183 Chronic kidney disease, stage 3 unspecified: Secondary | ICD-10-CM

## 2019-01-02 NOTE — Telephone Encounter (Signed)
Pt's wife, [name], calling to find out what is going on with his test strips - states she still can't get them from the pharmacy and needs to know what to do.

## 2019-01-03 ENCOUNTER — Other Ambulatory Visit: Payer: Self-pay | Admitting: Family Medicine

## 2019-01-03 DIAGNOSIS — I10 Essential (primary) hypertension: Secondary | ICD-10-CM

## 2019-01-04 NOTE — Telephone Encounter (Signed)
Pt wife calling back about the status of these test strips, she is asking for a call back today.  Pt is completely out of them.  She stated it has been a month and needs to know what is going on   [phone]- best number

## 2019-01-04 NOTE — Telephone Encounter (Signed)
I faxed over a new form to [facility] changing the frequency to see if this will help with ins coving the med since patient a1c is higher now. Form was faxed on 12/27/18. Havent heard anything back as of yet I will also try to fax again today

## 2019-01-08 NOTE — Telephone Encounter (Signed)
Wife calling again today cocerning test strips. Called the pharmacy.  They say all they need is a CMN form filled out, and they have sent 3 times. She is sending another one now, just in case you did not get.  Pt ghas been out of strips for a month. Please fill out and send.  Pt has medicare part B and advised by medicare the strips are covered.  glucose blood (ONETOUCH VERIO) test strip   [id number] - [place], [address] AT [facility] [address] [phone] (Phone) [phone] (Fax)

## 2019-01-30 DIAGNOSIS — E119 Type 2 diabetes mellitus without complications: Secondary | ICD-10-CM | POA: Diagnosis not present

## 2019-01-30 LAB — HM DIABETES EYE EXAM

## 2019-02-07 ENCOUNTER — Encounter: Payer: Self-pay | Admitting: Family Medicine

## 2019-02-12 ENCOUNTER — Other Ambulatory Visit: Payer: Self-pay | Admitting: Family Medicine

## 2019-02-12 DIAGNOSIS — N401 Enlarged prostate with lower urinary tract symptoms: Secondary | ICD-10-CM

## 2019-02-12 DIAGNOSIS — R351 Nocturia: Secondary | ICD-10-CM

## 2019-02-12 NOTE — Telephone Encounter (Signed)
Requested Prescriptions  Pending Prescriptions Disp Refills  . tamsulosin (FLOMAX) 0.4 MG CAPS capsule [Pharmacy Med Name: TAMSULOSIN 0.4MG  CAPSULES] 180 capsule 0    Sig: TAKE 2 CAPSULES(0.8 MG) BY MOUTH DAILY     Urology: Alpha-Adrenergic Blocker Passed - 02/12/2019  9:22 AM      Passed - Last BP in normal range    BP Readings from Last 1 Encounters:  12/19/18 102/76         Passed - Valid encounter within last 12 months    Recent Outpatient Visits          1 month ago Type 2 diabetes mellitus with chronic kidney disease, without long-term current use of insulin, unspecified CKD stage [facility])   [facility], Ranell Patrick, MD   4 months ago Eye swelling   [facility], Ranell Patrick, MD   6 months ago Type 2 diabetes mellitus with chronic kidney disease, without long-term current use of insulin, unspecified CKD stage [facility])   [facility], Ranell Patrick, MD   7 months ago PPD screening test   [facility], Laurey Arrow, MD   7 months ago PPD screening test   [facility], Laurey Arrow, MD      Future Appointments            In 1 month [name] Ranell Patrick, MD [facility] [place], [facility]

## 2019-02-13 NOTE — Telephone Encounter (Signed)
Pt wife calling to request a call back regarding test strips. Pt wife states that they are having a lot of problems with getting test strips. Please advise   [phone]

## 2019-02-16 ENCOUNTER — Other Ambulatory Visit: Payer: Self-pay | Admitting: Family Medicine

## 2019-02-16 DIAGNOSIS — R739 Hyperglycemia, unspecified: Secondary | ICD-10-CM

## 2019-02-16 MED ORDER — ONETOUCH VERIO VI STRP: ORAL_STRIP | 99 refills | Status: DC

## 2019-02-16 NOTE — Telephone Encounter (Signed)
glucose blood (ONETOUCH VERIO) test strip  Send to [facility] [place]

## 2019-02-19 NOTE — Telephone Encounter (Signed)
Are we able to do a PA for the test strips?

## 2019-02-20 ENCOUNTER — Other Ambulatory Visit: Payer: Self-pay | Admitting: *Deleted

## 2019-02-20 DIAGNOSIS — E1122 Type 2 diabetes mellitus with diabetic chronic kidney disease: Secondary | ICD-10-CM

## 2019-02-20 MED ORDER — BLOOD GLUCOSE METER KIT: PACK | 0 refills | Status: DC

## 2019-02-20 NOTE — Telephone Encounter (Signed)
Accu check prescription for new kit was faxed over to [facility] on file.  Medicaid will not cover the test strips for his machine.  Will need whole new device.  Per medicaid reference [phone]

## 2019-03-13 DIAGNOSIS — N184 Chronic kidney disease, stage 4 (severe): Secondary | ICD-10-CM | POA: Diagnosis not present

## 2019-03-13 DIAGNOSIS — E1122 Type 2 diabetes mellitus with diabetic chronic kidney disease: Secondary | ICD-10-CM | POA: Diagnosis not present

## 2019-03-13 DIAGNOSIS — N400 Enlarged prostate without lower urinary tract symptoms: Secondary | ICD-10-CM | POA: Diagnosis not present

## 2019-03-13 DIAGNOSIS — Z23 Encounter for immunization: Secondary | ICD-10-CM | POA: Diagnosis not present

## 2019-03-13 DIAGNOSIS — I129 Hypertensive chronic kidney disease with stage 1 through stage 4 chronic kidney disease, or unspecified chronic kidney disease: Secondary | ICD-10-CM | POA: Diagnosis not present

## 2019-03-13 DIAGNOSIS — E7800 Pure hypercholesterolemia, unspecified: Secondary | ICD-10-CM | POA: Diagnosis not present

## 2019-03-13 DIAGNOSIS — N2581 Secondary hyperparathyroidism of renal origin: Secondary | ICD-10-CM | POA: Diagnosis not present

## 2019-03-13 DIAGNOSIS — N189 Chronic kidney disease, unspecified: Secondary | ICD-10-CM | POA: Diagnosis not present

## 2019-03-13 DIAGNOSIS — D631 Anemia in chronic kidney disease: Secondary | ICD-10-CM | POA: Diagnosis not present

## 2019-03-16 ENCOUNTER — Other Ambulatory Visit: Payer: Self-pay | Admitting: Family Medicine

## 2019-03-16 DIAGNOSIS — E785 Hyperlipidemia, unspecified: Secondary | ICD-10-CM

## 2019-03-21 ENCOUNTER — Ambulatory Visit: Payer: Medicare Other | Admitting: Family Medicine

## 2019-03-30 ENCOUNTER — Other Ambulatory Visit: Payer: Self-pay | Admitting: Family Medicine

## 2019-03-30 DIAGNOSIS — E118 Type 2 diabetes mellitus with unspecified complications: Secondary | ICD-10-CM

## 2019-03-30 MED ORDER — GLIPIZIDE ER 10 MG PO TB24: 10.0000 mg | ORAL_TABLET | Freq: Every day | ORAL | 0 refills | Status: DC

## 2019-03-30 NOTE — Telephone Encounter (Signed)
glipiZIDE (GLUCOTROL XL) 10 MG 24 hr tablet   Patient is requesting a refill of these medications.   Pharmacy:        [facility] [place], [address] AT [facility] [address] [phone] (Phone) [phone] (Fax)

## 2019-04-09 ENCOUNTER — Other Ambulatory Visit: Payer: Self-pay | Admitting: Family Medicine

## 2019-04-09 DIAGNOSIS — E1122 Type 2 diabetes mellitus with diabetic chronic kidney disease: Secondary | ICD-10-CM

## 2019-04-09 DIAGNOSIS — N183 Chronic kidney disease, stage 3 unspecified: Secondary | ICD-10-CM

## 2019-04-09 MED ORDER — PIOGLITAZONE HCL 30 MG PO TABS: ORAL_TABLET | ORAL | 0 refills | Status: DC

## 2019-04-09 NOTE — Telephone Encounter (Signed)
Patient would like a refill on his pioglitazone (ACTOS) 30 MG tablet medication and have it sent to his preferred pharmacy [facility] on [facility] [address] [facility] in [place].  Patient is completely out of medication.

## 2019-04-10 DIAGNOSIS — N184 Chronic kidney disease, stage 4 (severe): Secondary | ICD-10-CM | POA: Diagnosis not present

## 2019-04-13 ENCOUNTER — Other Ambulatory Visit: Payer: Self-pay

## 2019-04-13 ENCOUNTER — Ambulatory Visit (INDEPENDENT_AMBULATORY_CARE_PROVIDER_SITE_OTHER): Payer: Medicare Other | Admitting: Family Medicine

## 2019-04-13 ENCOUNTER — Encounter: Payer: Self-pay | Admitting: Family Medicine

## 2019-04-13 VITALS — BP 104/61 | HR 88 | Temp 98.5°F | Resp 16 | Ht 65.0 in | Wt 143.0 lb

## 2019-04-13 DIAGNOSIS — E1122 Type 2 diabetes mellitus with diabetic chronic kidney disease: Secondary | ICD-10-CM | POA: Diagnosis not present

## 2019-04-13 DIAGNOSIS — K5900 Constipation, unspecified: Secondary | ICD-10-CM | POA: Diagnosis not present

## 2019-04-13 DIAGNOSIS — R739 Hyperglycemia, unspecified: Secondary | ICD-10-CM | POA: Diagnosis not present

## 2019-04-13 LAB — POCT GLYCOSYLATED HEMOGLOBIN (HGB A1C): Hemoglobin A1C: 7.8 % — AB (ref 4.0–5.6)

## 2019-04-13 LAB — GLUCOSE, POCT (MANUAL RESULT ENTRY): POC Glucose: 356 mg/dl — AB (ref 70–99)

## 2019-04-13 MED ORDER — SITAGLIPTIN PHOSPHATE 25 MG PO TABS: 25.0000 mg | ORAL_TABLET | Freq: Every day | ORAL | 2 refills | Status: DC

## 2019-04-13 MED ORDER — ONETOUCH VERIO VI STRP: ORAL_STRIP | 3 refills | Status: AC

## 2019-04-13 NOTE — Progress Notes (Signed)
Subjective:    Patient ID: Adam Reid, male    DOB: 1946/03/14, 73 y.o.   MRN: [id number] Wife translating.    HPI Adam Reid is a 73 y.o. male Presents today for: Chief Complaint  Patient presents with  . Diabetes    follow up  . Medication Refill    glucose strips   Diabetes: Complicated by chronic kidney disease, microalbuminuria Stage IV CKD.  Plan for access placement when GFR below 20.  Nephrology Dr. [name].  Note from December 2019 noted. Had appt few weeks ago - note not yet reviewed. Appt last week for CKD teaching. Function 23%.  Microalbumin: 724 in July 2019 Optho, foot exam, pneumovax: Up-to-date Current meds: Glipizide 10 mg daily, Actos 30 mg daily - still taking daily. No new side effects of meds.  He is on statin with Lipitor 80 mg daily.  Goal A1c less than 7.5.  Recommended home blood sugar readings, dietary changes and if elevated home readings medication changes were discussed after June visit. Plans on scheduling colonoscopy - received letter.    Improved diet since last visit.  Home readings:  Fasting: 132 -138, lowest 127.  2hr PP: none.  No no symptomatic lows.   Hard stools at times. Has had constipation at times prior.  Senna at times. Rare miralax.   Lab Results  Component Value Date   HGBA1C 8.0 (A) 12/19/2018   HGBA1C 7.9 (H) 09/19/2018   HGBA1C 7.6 (H) 06/20/2018   Lab Results  Component Value Date   MICROALBUR 60.3 09/02/2015   LDLCALC 98 12/19/2018   CREATININE 2.44 (H) 12/19/2018     Patient Active Problem List   Diagnosis Date Noted  . Secondary hyperparathyroidism, renal ([place]) 12/29/2016  . CAD (coronary artery disease), native coronary artery 08/02/2016  . Smoker 03/26/2016  . Hypotension 01/29/2016  . Abnormal myocardial perfusion study 01/29/2016  . Depression 12/09/2015  . Hyperlipidemia LDL goal <100 06/28/2014  . Diabetes mellitus with renal manifestations, controlled ([place])   . HTN (hypertension)  06/24/2014  . 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 BPH (benign prostatic hyperplasia) 06/24/2014   Past Medical History:  Diagnosis Date  . Chronic kidney disease   . Diabetes mellitus without complication ([place])   . Hyperlipidemia   . Hypertension   . Thyroid disease    was on supplement, taken off by [name]   Past Surgical History:  Procedure Laterality Date  . APPENDECTOMY    . CARDIAC CATHETERIZATION N/A 01/30/2016   Procedure: Left Heart Cath and Coronary Angiography;  Surgeon: [name], MD;  Location: [facility];  Service: Cardiovascular;  Laterality: N/A;   No Known Allergies Prior to Admission medications   Medication Sig Start Date End Date Taking? Authorizing Provider  aspirin EC 81 MG EC tablet Take 1 tablet (81 mg total) by mouth daily. Patient taking differently: Take 81 mg by mouth every other day.  12/03/15  Yes Mayo, Pete Pelt, MD  atorvastatin (LIPITOR) 80 MG tablet TAKE 1 TABLET(80 MG) BY MOUTH DAILY 03/16/19  Yes [name], MD  blood glucose meter kit and supplies Use up to two times daily as directed  ICD10 E10.9 E11.9 02/20/19  Yes [name], MD  Blood Glucose Monitoring Suppl (ONETOUCH VERIO) w/Device KIT USE UTD 09/03/15  Yes [provider]  calcitRIOL (ROCALTROL) 0.25 MCG capsule  09/09/17  Yes [provider]  Cholecalciferol (VITAMIN D-3 PO)  Take 1 tablet by mouth See admin instructions. Three times a week. No specific days   Yes [provider]  citalopram (CELEXA) 20 MG tablet TAKE 1 TABLET(20 MG) BY MOUTH DAILY 12/29/18  Yes [name], MD  cyanocobalamin 500 MCG tablet Take 500 mcg by mouth daily. Reported on 12/02/2015   Yes [provider]  glipiZIDE (GLUCOTROL XL) 10 MG 24 hr tablet Take 1 tablet (10 mg total) by mouth daily with breakfast. 03/30/19  Yes [name], MD  GlucoCom Lancets MISC Use for home glucose monitoring  09/02/15  Yes Jeffery, [place], PA  glucose blood (ONETOUCH VERIO) test strip USE ONCE DAILY 02/16/19  Yes [name], MD  metoprolol succinate (TOPROL-XL) 25 MG 24 hr tablet TAKE 1 TABLET(25 MG) BY MOUTH DAILY 01/03/19  Yes [name], MD  nitroGLYCERIN (NITROSTAT) 0.4 MG SL tablet Place 1 tablet (0.4 mg total) under the tongue every 5 (five) minutes x 3 doses as needed for chest pain. 01/31/16  Yes [name] E, NP  pioglitazone (ACTOS) 30 MG tablet TAKE 1 TABLET(30 MG) BY MOUTH DAILY 04/09/19  Yes [name], MD  tamsulosin (FLOMAX) 0.4 MG CAPS capsule TAKE 2 CAPSULES(0.8 MG) BY MOUTH DAILY 02/12/19  Yes [name], MD   Social History   Socioeconomic History  . Marital status: Married    Spouse name: Adam Reid  . Number of children: 1  . Years of education: 12th grade  . Highest education level: Not on file  Occupational History  . Occupation: Retired-accountant  Social Needs  . Financial resource strain: Not on file  . Food insecurity    Worry: Not on file    Inability: Not on file  . Transportation needs    Medical: Not on file    Non-medical: Not on file  Tobacco Use  . Smoking status: Current Every Day Smoker    Packs/day: 0.25    Years: 48.00    Pack years: 12.00    Types: Cigarettes  . Smokeless tobacco: Never Used  . Tobacco comment: 09/07/16 3-4 cigs daily, trying to cut back  Substance and Sexual Activity  . Alcohol use: No    Alcohol/week: 0.0 standard drinks  . Drug use: No  . Sexual activity: Never    Partners: Female  Lifestyle  . Physical activity    Days per week: Not on file    Minutes per session: Not on file  . Stress: Not on file  Relationships  . Social Herbalist on phone: Not on file    Gets together: Not on file    Attends religious service: Not on file    Active member of club or organization: Not on file    Attends meetings of clubs or organizations: Not on file    Relationship status: Not on file  . Intimate  partner violence    Fear of current or ex partner: Not on file    Emotionally abused: Not on file    Physically abused: Not on file    Forced sexual activity: Not on file  Other Topics Concern  . Not on file  Social History Narrative   Originally from [place]. Came to the [place] in 2009, following their son who came here for school.   Married.   Lives with his wife.   Their adult son lives in [place], [place], where he is in optometry school.   Education: [employer]   Exercise: No  Review of Systems  Constitutional: Negative for fatigue and unexpected weight change.  Eyes: Negative for visual disturbance.  Respiratory: Negative for cough, chest tightness and shortness of breath.   Cardiovascular: Negative for chest pain, palpitations and leg swelling.  Gastrointestinal: Negative for abdominal pain and blood in stool.  Neurological: Negative for dizziness, light-headedness and headaches.       Objective:   Physical Exam Vitals signs reviewed.  Constitutional:      Appearance: He is well-developed.  HENT:     Head: Normocephalic and atraumatic.  Eyes:     Pupils: Pupils are equal, round, and reactive to light.  Neck:     Vascular: No carotid bruit or JVD.  Cardiovascular:     Rate and Rhythm: Normal rate and regular rhythm.     Heart sounds: Normal heart sounds. No murmur.  Pulmonary:     Effort: Pulmonary effort is normal.     Breath sounds: Normal breath sounds. No rales.  Abdominal:     General: Abdomen is flat.     Tenderness: There is no abdominal tenderness.  Skin:    General: Skin is warm and dry.  Neurological:     Mental Status: He is alert and oriented to person, place, and time.    Vitals:   04/13/19 1154  BP: 104/61  Pulse: 88  Resp: 16  Temp: 98.5 F (36.9 C)  TempSrc: Oral  SpO2: 98%  Weight: 143 lb (64.9 kg)  Height: _0  (1.651 m)   Results for orders placed or performed in visit on 04/13/19  POCT glycosylated hemoglobin (Hb A1C)  Result Value  Ref Range   Hemoglobin A1C 7.8 (A) 4.0 - 5.6 %   HbA1c POC (<> result, manual entry)     HbA1c, POC (prediabetic range)     HbA1c, POC (controlled diabetic range)    POCT glucose (manual entry)  Result Value Ref Range   POC Glucose 356 (A) 70 - 99 mg/dl   Repeat glucose 333.  Ate BF this am, no missed meds.     Assessment & Plan:    An Schnabel is a 73 y.o. male Type 2 diabetes mellitus with chronic kidney disease, without long-term current use of insulin, unspecified CKD stage ([place]) - Plan: Microalbumin/Creatinine Ratio, Urine, POCT glycosylated hemoglobin (Hb A1C), POCT glucose (manual entry), glucose blood (ONETOUCH VERIO) test strip, sitaGLIPtin (JANUVIA) 25 MG tablet Hyperglycemia  - min change in A1C, but significant hyperglycemia in office. Postprandial may be issue.   -Continue glipizide, Actos same dose for now, add low-dose DPP 4, renally dose Januvia 25 mg daily.  Monitor home readings including fastings and 2-hour postprandials with update next few weeks if possible.  Recheck 3 months.  Constipation, unspecified constipation type  -Prevention, treatment techniques discussed including over-the-counter MiraLAX if needed.  Follow-up with gastroenterology recommended as due for colonoscopy  Meds ordered this encounter  Medications  . glucose blood (ONETOUCH VERIO) test strip    Sig: Test up to 2 times per day.  Uncontrolled diabetes with hyperglycemia and stage 4 CKD.    Dispense:  100 each    Refill:  3    Dx. E11.22  . sitaGLIPtin (JANUVIA) 25 MG tablet    Sig: Take 1 tablet (25 mg total) by mouth daily.    Dispense:  30 tablet    Refill:  2   Patient Instructions    See information below for constipation treatment and prevention.  Senna is okay, MiraLAX if needed for hard  stools.  Make sure to drink plenty of fluids and fiber in the diet.  I do also recommend follow-up with gastroenterology for repeat colonoscopy, and can discuss constipation with them as well.   For diabetes, continue glipizide and pioglitazone at the same doses for now.  Will also add new med Januvia once per day for now.   Blood sugar higher than expected today. Measure levels at home and if over 250, let me know.   Return to the clinic or go to the nearest emergency room if any of your symptoms worsen or new symptoms occur.   Constipation, Adult Constipation is when a person has fewer bowel movements in a week than normal, has difficulty having a bowel movement, or has stools that are dry, hard, or larger than normal. Constipation may be caused by an underlying condition. It may become worse with age if a person takes certain medicines and does not take in enough fluids. Follow these instructions at home: Eating and drinking   Eat foods that have a lot of fiber, such as fresh fruits and vegetables, whole grains, and beans.  Limit foods that are high in fat, low in fiber, or overly processed, such as french fries, hamburgers, cookies, candies, and soda.  Drink enough fluid to keep your urine clear or pale yellow. General instructions  Exercise regularly or as told by your health care provider.  Go to the restroom when you have the urge to go. Do not hold it in.  Take over-the-counter and prescription medicines only as told by your health care provider. These include any fiber supplements.  Practice pelvic floor retraining exercises, such as deep breathing while relaxing the lower abdomen and pelvic floor relaxation during bowel movements.  Watch your condition for any changes.  Keep all follow-up visits as told by your health care provider. This is important. Contact a health care provider if:  You have pain that gets worse.  You have a fever.  You do not have a bowel movement after 4 days.  You vomit.  You are not hungry.  You lose weight.  You are bleeding from the anus.  You have thin, pencil-like stools. Get help right away if:  You have a fever and  your symptoms suddenly get worse.  You leak stool or have blood in your stool.  Your abdomen is bloated.  You have severe pain in your abdomen.  You feel dizzy or you faint. This information is not intended to replace advice given to you by your health care provider. Make sure you discuss any questions you have with your health care provider. Document Released: 03/26/2004 Document Revised: 06/10/2017 Document Reviewed: 12/17/2015 Elsevier Patient Education  [employer].       If you have lab work done today you will be contacted with your lab results within the next 2 weeks.  If you have not heard from [place] then please contact us. The fastest way to get your results is to register for My Chart.   IF you received an x-ray today, you will receive an invoice from [facility]. Please contact [facility] at [phone] with questions or concerns regarding your invoice.   IF you received labwork today, you will receive an invoice from [facility]. Please contact [facility] at [phone] with questions or concerns regarding your invoice.   Our billing staff will not be able to assist you with questions regarding bills from these companies.  You will be contacted with the lab results as  soon as they are available. The fastest way to get your results is to activate your My Chart account. Instructions are located on the last page of this paperwork. If you have not heard from [place] regarding the results in 2 weeks, please contact this office.      Signed,   Merri Ray, MD [facility] at [place].  04/13/19 1:39 PM

## 2019-04-13 NOTE — Patient Instructions (Addendum)
See information below for constipation treatment and prevention.  Senna is okay, MiraLAX if needed for hard stools.  Make sure to drink plenty of fluids and fiber in the diet.  I do also recommend follow-up with gastroenterology for repeat colonoscopy, and can discuss constipation with them as well.  For diabetes, continue glipizide and pioglitazone at the same doses for now.  Will also add new med Januvia once per day for now.   Blood sugar higher than expected today. Measure levels at home and if over 250, let me know.   Return to the clinic or go to the nearest emergency room if any of your symptoms worsen or new symptoms occur.   Constipation, Adult Constipation is when a person has fewer bowel movements in a week than normal, has difficulty having a bowel movement, or has stools that are dry, hard, or larger than normal. Constipation may be caused by an underlying condition. It may become worse with age if a person takes certain medicines and does not take in enough fluids. Follow these instructions at home: Eating and drinking   Eat foods that have a lot of fiber, such as fresh fruits and vegetables, whole grains, and beans.  Limit foods that are high in fat, low in fiber, or overly processed, such as french fries, hamburgers, cookies, candies, and soda.  Drink enough fluid to keep your urine clear or pale yellow. General instructions  Exercise regularly or as told by your health care provider.  Go to the restroom when you have the urge to go. Do not hold it in.  Take over-the-counter and prescription medicines only as told by your health care provider. These include any fiber supplements.  Practice pelvic floor retraining exercises, such as deep breathing while relaxing the lower abdomen and pelvic floor relaxation during bowel movements.  Watch your condition for any changes.  Keep all follow-up visits as told by your health care provider. This is important. Contact a  health care provider if:  You have pain that gets worse.  You have a fever.  You do not have a bowel movement after 4 days.  You vomit.  You are not hungry.  You lose weight.  You are bleeding from the anus.  You have thin, pencil-like stools. Get help right away if:  You have a fever and your symptoms suddenly get worse.  You leak stool or have blood in your stool.  Your abdomen is bloated.  You have severe pain in your abdomen.  You feel dizzy or you faint. This information is not intended to replace advice given to you by your health care provider. Make sure you discuss any questions you have with your health care provider. Document Released: 03/26/2004 Document Revised: 06/10/2017 Document Reviewed: 12/17/2015 Elsevier Patient Education  [employer].       If you have lab work done today you will be contacted with your lab results within the next 2 weeks.  If you have not heard from [place] then please contact us. The fastest way to get your results is to register for My Chart.   IF you received an x-ray today, you will receive an invoice from [facility]. Please contact [facility] at [phone] with questions or concerns regarding your invoice.   IF you received labwork today, you will receive an invoice from [facility]. Please contact [facility] at [phone] with questions or concerns regarding your invoice.   Our billing staff will not be able to assist you with  questions regarding bills from these companies.  You will be contacted with the lab results as soon as they are available. The fastest way to get your results is to activate your My Chart account. Instructions are located on the last page of this paperwork. If you have not heard from [place] regarding the results in 2 weeks, please contact this office.

## 2019-04-14 LAB — MICROALBUMIN / CREATININE URINE RATIO
Creatinine, Urine: 72.9 mg/dL
Microalb/Creat Ratio: 698 mg/g creat — ABNORMAL HIGH (ref 0–29)
Microalbumin, Urine: 509.1 ug/mL

## 2019-07-02 ENCOUNTER — Other Ambulatory Visit: Payer: Self-pay

## 2019-07-02 ENCOUNTER — Telehealth (INDEPENDENT_AMBULATORY_CARE_PROVIDER_SITE_OTHER): Payer: Medicare Other | Admitting: Registered Nurse

## 2019-07-02 ENCOUNTER — Other Ambulatory Visit: Payer: Self-pay | Admitting: Registered Nurse

## 2019-07-02 ENCOUNTER — Encounter: Payer: Self-pay | Admitting: Registered Nurse

## 2019-07-02 DIAGNOSIS — R05 Cough: Secondary | ICD-10-CM

## 2019-07-02 DIAGNOSIS — F329 Major depressive disorder, single episode, unspecified: Secondary | ICD-10-CM

## 2019-07-02 DIAGNOSIS — E118 Type 2 diabetes mellitus with unspecified complications: Secondary | ICD-10-CM

## 2019-07-02 DIAGNOSIS — F32A Depression, unspecified: Secondary | ICD-10-CM

## 2019-07-02 DIAGNOSIS — R059 Cough, unspecified: Secondary | ICD-10-CM

## 2019-07-02 DIAGNOSIS — R110 Nausea: Secondary | ICD-10-CM

## 2019-07-02 MED ORDER — CITALOPRAM HYDROBROMIDE 20 MG PO TABS: ORAL_TABLET | ORAL | 0 refills | Status: DC

## 2019-07-02 MED ORDER — ALBUTEROL SULFATE HFA 108 (90 BASE) MCG/ACT IN AERS: 2.0000 | INHALATION_SPRAY | Freq: Four times a day (QID) | RESPIRATORY_TRACT | 0 refills | Status: DC | PRN

## 2019-07-02 MED ORDER — BENZONATATE 100 MG PO CAPS: 100.0000 mg | ORAL_CAPSULE | Freq: Two times a day (BID) | ORAL | 0 refills | Status: DC | PRN

## 2019-07-02 MED ORDER — GLIPIZIDE ER 10 MG PO TB24: 10.0000 mg | ORAL_TABLET | Freq: Every day | ORAL | 0 refills | Status: DC

## 2019-07-02 MED ORDER — ONDANSETRON HCL 4 MG PO TABS: 4.0000 mg | ORAL_TABLET | Freq: Three times a day (TID) | ORAL | 0 refills | Status: DC | PRN

## 2019-07-02 NOTE — Patient Instructions (Signed)
° ° ° °  If you have lab work done today you will be contacted with your lab results within the next 2 weeks.  If you have not heard from us then please contact us. The fastest way to get your results is to register for My Chart. ° ° °IF you received an x-ray today, you will receive an invoice from [facility]. Please contact [facility] at [phone] with questions or concerns regarding your invoice.  ° °IF you received labwork today, you will receive an invoice from [facility]. Please contact [facility] at [phone] with questions or concerns regarding your invoice.  ° °Our billing staff will not be able to assist you with questions regarding bills from these companies. ° °You will be contacted with the lab results as soon as they are available. The fastest way to get your results is to activate your My Chart account. Instructions are located on the last page of this paperwork. If you have not heard from us regarding the results in 2 weeks, please contact this office. °  ° ° ° °

## 2019-07-02 NOTE — Progress Notes (Signed)
Loss of taste. Loss of appetite Cough at night   Neg COVID test

## 2019-07-03 NOTE — Progress Notes (Signed)
Telemedicine Encounter- SOAP NOTE Established Patient  This telephone encounter was conducted with the patient's (or proxy's) verbal consent via audio telecommunications: yes  Patient was instructed to have this encounter in a suitably private space; and to only have persons present to whom they give permission to participate. In addition, patient identity was confirmed by use of name plus two identifiers (DOB and address).  I discussed the limitations, risks, security and privacy concerns of performing an evaluation and management service by telephone and the availability of in person appointments. I also discussed with the patient that there may be a patient responsible charge related to this service. The patient expressed understanding and agreed to proceed.  I spent a total of 13 minutes talking with the patient or their proxy.  No chief complaint on file.   Subjective   Adam Reid is a 73 y.o. established patient. Telephone visit today for COVID symptoms  HPI Pt states symptoms have been ongoing for around 1 week. Have seen definite improvement. Some cough and congestion, some loss of taste. Otherwise feeling mostly well. Known exposure to COVID but had negative test. Denies headache, shob, doe, chest pain, fever, chills, dependent edema. No changes to chronic conditions. His wife was able to help translate.  Patient Active Problem List   Diagnosis Date Noted  . Secondary hyperparathyroidism, renal ([place]) 12/29/2016  . CAD (coronary artery disease), native coronary artery 08/02/2016  . Smoker 03/26/2016  . Hypotension 01/29/2016  . Abnormal myocardial perfusion study 01/29/2016  . Depression 12/09/2015  . Hyperlipidemia LDL goal <100 06/28/2014  . Diabetes mellitus with renal manifestations, controlled ([place])   . HTN (hypertension) 06/24/2014  . 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 BPH (benign prostatic hyperplasia)  06/24/2014    Past Medical History:  Diagnosis Date  . Chronic kidney disease   . Diabetes mellitus without complication ([place])   . Hyperlipidemia   . Hypertension   . Thyroid disease    was on supplement, taken off by [name]    Current Outpatient Medications  Medication Sig Dispense Refill  . aspirin EC 81 MG EC tablet Take 1 tablet (81 mg total) by mouth daily. (Patient taking differently: Take 81 mg by mouth every other day. ) 30 tablet 5  . atorvastatin (LIPITOR) 80 MG tablet TAKE 1 TABLET(80 MG) BY MOUTH DAILY 90 tablet 0  . blood glucose meter kit and supplies Use up to two times daily as directed  ICD10 E10.9 E11.9 1 each 0  . Blood Glucose Monitoring Suppl (ONETOUCH VERIO) w/Device KIT USE UTD  0  . calcitRIOL (ROCALTROL) 0.25 MCG capsule   0  . Cholecalciferol (VITAMIN D-3 PO) Take 1 tablet by mouth See admin instructions. Three times a week. No specific days    . citalopram (CELEXA) 20 MG tablet TAKE 1 TABLET(20 MG) BY MOUTH DAILY 30 tablet 0  . cyanocobalamin 500 MCG tablet Take 500 mcg by mouth daily. Reported on 12/02/2015    . glipiZIDE (GLUCOTROL XL) 10 MG 24 hr tablet Take 1 tablet (10 mg total) by mouth daily with breakfast. 30 tablet 0  . GlucoCom Lancets MISC Use for home glucose monitoring 100 each 3  . glucose blood (ONETOUCH VERIO) test strip Test up to 2 times per day.  Uncontrolled diabetes with hyperglycemia and stage 4 CKD. 100 each 3  . metoprolol succinate (TOPROL-XL) 25 MG 24 hr tablet TAKE 1 TABLET(25 MG) BY MOUTH DAILY  90 tablet 1  . nitroGLYCERIN (NITROSTAT) 0.4 MG SL tablet Place 1 tablet (0.4 mg total) under the tongue every 5 (five) minutes x 3 doses as needed for chest pain. 25 tablet 2  . pioglitazone (ACTOS) 30 MG tablet TAKE 1 TABLET(30 MG) BY MOUTH DAILY 90 tablet 0  . sitaGLIPtin (JANUVIA) 25 MG tablet Take 1 tablet (25 mg total) by mouth daily. 30 tablet 2  . tamsulosin (FLOMAX) 0.4 MG CAPS capsule TAKE 2 CAPSULES(0.8 MG) BY MOUTH DAILY 180  capsule 0  . albuterol (VENTOLIN HFA) 108 (90 Base) MCG/ACT inhaler Inhale 2 puffs into the lungs every 6 (six) hours as needed for wheezing or shortness of breath. 18 g 0  . benzonatate (TESSALON) 100 MG capsule Take 1 capsule (100 mg total) by mouth 2 (two) times daily as needed for cough. 20 capsule 0  . ondansetron (ZOFRAN) 4 MG tablet Take 1 tablet (4 mg total) by mouth every 8 (eight) hours as needed for nausea or vomiting. 20 tablet 0   No current facility-administered medications for this visit.    No Known Allergies  Social History   Socioeconomic History  . Marital status: Married    Spouse name: [name]  . Number of children: 1  . Years of education: 12th grade  . Highest education level: Not on file  Occupational History  . Occupation: Retired-accountant  Tobacco Use  . Smoking status: Current Every Day Smoker    Packs/day: 0.25    Years: 48.00    Pack years: 12.00    Types: Cigarettes  . Smokeless tobacco: Never Used  . Tobacco comment: 09/07/16 3-4 cigs daily, trying to cut back  Substance and Sexual Activity  . Alcohol use: No    Alcohol/week: 0.0 standard drinks  . Drug use: No  . Sexual activity: Never    Partners: Female  Other Topics Concern  . Not on file  Social History Narrative   Originally from [place]. Came to the [place] in 2009, following their son who came here for school.   Married.   Lives with his wife.   Their adult son lives in [place], [place], where he is in optometry school.   Education: [employer]   Exercise: No   Social Determinants of Radio broadcast assistant Strain:   . Difficulty of Paying Living Expenses: Not on file  Food Insecurity:   . Worried About Charity fundraiser in the Last Year: Not on file  . Ran Out of Food in the Last Year: Not on file  Transportation Needs:   . Lack of Transportation (Medical): Not on file  . Lack of Transportation (Non-Medical): Not on file  Physical Activity:   . Days of Exercise per Week: Not on  file  . Minutes of Exercise per Session: Not on file  Stress:   . Feeling of Stress : Not on file  Social Connections:   . Frequency of Communication with Friends and Family: Not on file  . Frequency of Social Gatherings with Friends and Family: Not on file  . Attends Religious Services: Not on file  . Active Member of Clubs or Organizations: Not on file  . Attends Archivist Meetings: Not on file  . Marital Status: Not on file  Intimate Partner Violence:   . Fear of Current or Ex-Partner: Not on file  . Emotionally Abused: Not on file  . Physically Abused: Not on file  . Sexually Abused: Not on file  Review of Systems  Constitutional: Negative.  Negative for chills, fever and malaise/fatigue.  HENT: Positive for congestion. Negative for ear discharge, ear pain, hearing loss, nosebleeds, sinus pain, sore throat and tinnitus.   Eyes: Negative.   Respiratory: Positive for cough. Negative for hemoptysis, sputum production, shortness of breath, wheezing and stridor.   Cardiovascular: Negative.   Gastrointestinal: Negative.   Genitourinary: Negative.   Musculoskeletal: Negative.   Skin: Negative.   Neurological: Positive for sensory change (mild loss of taste). Negative for dizziness, tingling, tremors, speech change, focal weakness, seizures, loss of consciousness, weakness and headaches.  Endo/Heme/Allergies: Negative.   Psychiatric/Behavioral: Negative.   All other systems reviewed and are negative.   Objective   Vitals as reported by the patient: There were no vitals filed for this visit.  Diagnoses and all orders for this visit:  Cough -     albuterol (VENTOLIN HFA) 108 (90 Base) MCG/ACT inhaler; Inhale 2 puffs into the lungs every 6 (six) hours as needed for wheezing or shortness of breath. -     ondansetron (ZOFRAN) 4 MG tablet; Take 1 tablet (4 mg total) by mouth every 8 (eight) hours as needed for nausea or vomiting.  Nausea without vomiting -      benzonatate (TESSALON) 100 MG capsule; Take 1 capsule (100 mg total) by mouth 2 (two) times daily as needed for cough.  DM type 2, controlled, with complication (H[facility]) -     glipiZIDE (GLUCOTROL XL) 10 MG 24 hr tablet; Take 1 tablet (10 mg total) by mouth daily with breakfast.  Depression, unspecified depression type -     citalopram (CELEXA) 20 MG tablet; TAKE 1 TABLET(20 MG) BY MOUTH DAILY   PLAN  Given albuterol, tessalon and ondansetron for ongoing symptoms. Will continue to monitor and he will call clinic if symptoms worsen or fail to improve.  Refilled glipizide and citalopram for patient to get him through to his next visit with Dr. [name].  Patient encouraged to call clinic with any questions, comments, or concerns.   I discussed the assessment and treatment plan with the patient. The patient was provided an opportunity to ask questions and all were answered. The patient agreed with the plan and demonstrated an understanding of the instructions.   The patient was advised to call back or seek an in-person evaluation if the symptoms worsen or if the condition fails to improve as anticipated.  I provided 13 minutes of non-face-to-face time during this encounter.  Maximiano Coss, NP  Primary Care at [facility]

## 2019-07-04 ENCOUNTER — Encounter: Payer: Self-pay | Admitting: Emergency Medicine

## 2019-07-04 ENCOUNTER — Telehealth: Payer: Self-pay | Admitting: Family Medicine

## 2019-07-04 ENCOUNTER — Other Ambulatory Visit: Payer: Self-pay

## 2019-07-04 ENCOUNTER — Telehealth (INDEPENDENT_AMBULATORY_CARE_PROVIDER_SITE_OTHER): Payer: Medicare Other | Admitting: Emergency Medicine

## 2019-07-04 DIAGNOSIS — R059 Cough, unspecified: Secondary | ICD-10-CM

## 2019-07-04 DIAGNOSIS — R05 Cough: Secondary | ICD-10-CM

## 2019-07-04 DIAGNOSIS — J22 Unspecified acute lower respiratory infection: Secondary | ICD-10-CM | POA: Diagnosis not present

## 2019-07-04 MED ORDER — HYDROCODONE-HOMATROPINE 5-1.5 MG/5ML PO SYRP: 5.0000 mL | ORAL_SOLUTION | Freq: Every evening | ORAL | 0 refills | Status: DC | PRN

## 2019-07-04 MED ORDER — AZITHROMYCIN 250 MG PO TABS: ORAL_TABLET | ORAL | 0 refills | Status: DC

## 2019-07-04 NOTE — Telephone Encounter (Signed)
Pt's daughter called asking for an update on her FMLA for taking care of her father during covid. Deadline is 12/29. Pt requesting cb for update and pick up

## 2019-07-04 NOTE — Progress Notes (Signed)
Telemedicine Encounter- SOAP NOTE Established Patient  This telephone encounter was conducted with the patient's (or proxy's) verbal consent via audio telecommunications: yes/no: Yes Patient was instructed to have this encounter in a suitably private space; and to only have persons present to whom they give permission to participate. In addition, patient identity was confirmed by use of name plus two identifiers (DOB and address).  I discussed the limitations, risks, security and privacy concerns of performing an evaluation and management service by telephone and the availability of in person appointments. I also discussed with the patient that there may be a patient responsible charge related to this service. The patient expressed understanding and agreed to proceed.  I spent a total of TIME; 0 MIN TO 60 MIN: 15 minutes talking with the patient or their proxy.  No chief complaint on file.   Subjective   Adam Reid is a 73 y.o. male established patient. Telephone visit today complaining of flulike symptoms for 1 week.  Complaining of productive cough.  Denies fever chills.  Denies difficulty breathing.  History obtained with the help of wife who is acting as interpreter.  No other significant symptoms.  Patient is diabetic and sugars have been running a little high lately.  Telemedicine visit 07/02/2019 and started on albuterol inhaler and Tessalon.  Covid test negative.  HPI   Patient Active Problem List   Diagnosis Date Noted  . Secondary hyperparathyroidism, renal ([place]) 12/29/2016  . CAD (coronary artery disease), native coronary artery 08/02/2016  . Smoker 03/26/2016  . Hypotension 01/29/2016  . Abnormal myocardial perfusion study 01/29/2016  . Depression 12/09/2015  . Hyperlipidemia LDL goal <100 06/28/2014  . Diabetes mellitus with renal manifestations, controlled ([place])   . HTN (hypertension) 06/24/2014  . Hypertensive heart and kidney disease without HF and with CKD  stage IV ([place]) 06/24/2014  . BPH (benign prostatic hyperplasia) 06/24/2014    Past Medical History:  Diagnosis Date  . Chronic kidney disease   . Diabetes mellitus without complication ([place])   . Hyperlipidemia   . Hypertension   . Thyroid disease    was on supplement, taken off by [name]    Current Outpatient Medications  Medication Sig Dispense Refill  . albuterol (VENTOLIN HFA) 108 (90 Base) MCG/ACT inhaler Inhale 2 puffs into the lungs every 6 (six) hours as needed for wheezing or shortness of breath. 18 g 0  . aspirin EC 81 MG EC tablet Take 1 tablet (81 mg total) by mouth daily. (Patient taking differently: Take 81 mg by mouth every other day. ) 30 tablet 5  . atorvastatin (LIPITOR) 80 MG tablet TAKE 1 TABLET(80 MG) BY MOUTH DAILY 90 tablet 0  . benzonatate (TESSALON) 100 MG capsule Take 1 capsule (100 mg total) by mouth 2 (two) times daily as needed for cough. 20 capsule 0  . blood glucose meter kit and supplies Use up to two times daily as directed  ICD10 E10.9 E11.9 1 each 0  . Blood Glucose Monitoring Suppl (ONETOUCH VERIO) w/Device KIT USE UTD  0  . calcitRIOL (ROCALTROL) 0.25 MCG capsule   0  . Cholecalciferol (VITAMIN D-3 PO) Take 1 tablet by mouth See admin instructions. Three times a week. No specific days    . citalopram (CELEXA) 20 MG tablet TAKE 1 TABLET(20 MG) BY MOUTH DAILY 30 tablet 0  . cyanocobalamin 500 MCG tablet Take 500 mcg by mouth daily. Reported on 12/02/2015    . glipiZIDE (GLUCOTROL XL) 10 MG 24 hr  tablet Take 1 tablet (10 mg total) by mouth daily with breakfast. 30 tablet 0  . GlucoCom Lancets MISC Use for home glucose monitoring 100 each 3  . glucose blood (ONETOUCH VERIO) test strip Test up to 2 times per day.  Uncontrolled diabetes with hyperglycemia and stage 4 CKD. 100 each 3  . metoprolol succinate (TOPROL-XL) 25 MG 24 hr tablet TAKE 1 TABLET(25 MG) BY MOUTH DAILY 90 tablet 1  . nitroGLYCERIN (NITROSTAT) 0.4 MG SL tablet Place 1 tablet (0.4 mg  total) under the tongue every 5 (five) minutes x 3 doses as needed for chest pain. 25 tablet 2  . ondansetron (ZOFRAN) 4 MG tablet Take 1 tablet (4 mg total) by mouth every 8 (eight) hours as needed for nausea or vomiting. 20 tablet 0  . pioglitazone (ACTOS) 30 MG tablet TAKE 1 TABLET(30 MG) BY MOUTH DAILY 90 tablet 0  . sitaGLIPtin (JANUVIA) 25 MG tablet Take 1 tablet (25 mg total) by mouth daily. 30 tablet 2  . tamsulosin (FLOMAX) 0.4 MG CAPS capsule TAKE 2 CAPSULES(0.8 MG) BY MOUTH DAILY 180 capsule 0   No current facility-administered medications for this visit.    No Known Allergies  Social History   Socioeconomic History  . Marital status: Married    Spouse name: [name]  . Number of children: 1  . Years of education: 12th grade  . Highest education level: Not on file  Occupational History  . Occupation: Retired-accountant  Tobacco Use  . Smoking status: Current Every Day Smoker    Packs/day: 0.25    Years: 48.00    Pack years: 12.00    Types: Cigarettes  . Smokeless tobacco: Never Used  . Tobacco comment: 09/07/16 3-4 cigs daily, trying to cut back  Substance and Sexual Activity  . Alcohol use: No    Alcohol/week: 0.0 standard drinks  . Drug use: No  . Sexual activity: Never    Partners: Female  Other Topics Concern  . Not on file  Social History Narrative   Originally from [place]. Came to the [place] in 2009, following their son who came here for school.   Married.   Lives with his wife.   Their adult son lives in [place], [place], where he is in optometry school.   Education: [employer]   Exercise: No   Social Determinants of Radio broadcast assistant Strain:   . Difficulty of Paying Living Expenses: Not on file  Food Insecurity:   . Worried About Charity fundraiser in the Last Year: Not on file  . Ran Out of Food in the Last Year: Not on file  Transportation Needs:   . Lack of Transportation (Medical): Not on file  . Lack of Transportation (Non-Medical): Not on  file  Physical Activity:   . Days of Exercise per Week: Not on file  . Minutes of Exercise per Session: Not on file  Stress:   . Feeling of Stress : Not on file  Social Connections:   . Frequency of Communication with Friends and Family: Not on file  . Frequency of Social Gatherings with Friends and Family: Not on file  . Attends Religious Services: Not on file  . Active Member of Clubs or Organizations: Not on file  . Attends Archivist Meetings: Not on file  . Marital Status: Not on file  Intimate Partner Violence:   . Fear of Current or Ex-Partner: Not on file  . Emotionally Abused: Not on file  .  Physically Abused: Not on file  . Sexually Abused: Not on file    Review of Systems  Constitutional: Negative.  Negative for chills and fever.  HENT: Negative.  Negative for congestion and sore throat.   Respiratory: Positive for cough and sputum production. Negative for shortness of breath and wheezing.   Cardiovascular: Negative.  Negative for chest pain and palpitations.  Gastrointestinal: Negative.  Negative for abdominal pain, blood in stool, diarrhea, nausea and vomiting.  Musculoskeletal: Negative for myalgias.  Skin: Negative.  Negative for rash.  Neurological: Negative for dizziness and headaches.  All other systems reviewed and are negative.   Objective  Alert and oriented x3 no apparent respiratory distress. :There were no vitals filed for this visit.  There are no diagnoses linked to this encounter.  Diagnoses and all orders for this visit:  Cough -     HYDROcodone-homatropine (HYCODAN) 5-1.5 MG/5ML syrup; Take 5 mLs by mouth at bedtime as needed for cough.  Lower respiratory infection -     azithromycin (ZITHROMAX) 250 MG tablet; Sig as indicated     I discussed the assessment and treatment plan with the patient. The patient was provided an opportunity to ask questions and all were answered. The patient agreed with the plan and demonstrated an  understanding of the instructions.   The patient was advised to call back or seek an in-person evaluation if the symptoms worsen or if the condition fails to improve as anticipated.  I provided 15 minutes of non-face-to-face time during this encounter.  Horald Pollen, MD  Primary Care at [facility]

## 2019-07-04 NOTE — Telephone Encounter (Signed)
Just a FYI on patient's [employer] paperwork.

## 2019-07-04 NOTE — Telephone Encounter (Signed)
I did not see this paperwork when in office yesterday but will look for it next day I can get into office, at the latest Monday.

## 2019-07-04 NOTE — Progress Notes (Signed)
chief complaint coughing for  1 week. He had a Covid test but it was negative. Cough up little mucus, mucus has little color in the mucus. Was given med for cough and inhaler. Blood sugar for the past 2 day has been running 165-181. Not a lot of appetite. No nausea or vomiting at this time.

## 2019-07-09 ENCOUNTER — Other Ambulatory Visit: Payer: Self-pay | Admitting: Family Medicine

## 2019-07-09 DIAGNOSIS — N183 Chronic kidney disease, stage 3 unspecified: Secondary | ICD-10-CM

## 2019-07-09 DIAGNOSIS — E1122 Type 2 diabetes mellitus with diabetic chronic kidney disease: Secondary | ICD-10-CM

## 2019-07-09 MED ORDER — PIOGLITAZONE HCL 30 MG PO TABS: ORAL_TABLET | ORAL | 0 refills | Status: DC

## 2019-07-09 MED ORDER — SITAGLIPTIN PHOSPHATE 25 MG PO TABS: 25.0000 mg | ORAL_TABLET | Freq: Every day | ORAL | 2 refills | Status: DC

## 2019-07-09 NOTE — Telephone Encounter (Signed)
Pt request refill  pioglitazone (ACTOS) 30 MG tablet  sitaGLIPtin (JANUVIA) 25 MG tablet  [id number] - [place], [place] - [address] AT [facility] [address] Phone:  [phone]  Fax:  [phone]

## 2019-07-10 NOTE — Telephone Encounter (Signed)
Paperwork completed for ladan's father.

## 2019-07-17 ENCOUNTER — Ambulatory Visit: Payer: Medicare Other | Admitting: Family Medicine

## 2019-07-20 ENCOUNTER — Encounter: Payer: Self-pay | Admitting: Family Medicine

## 2019-07-20 ENCOUNTER — Ambulatory Visit (INDEPENDENT_AMBULATORY_CARE_PROVIDER_SITE_OTHER): Payer: Medicare Other | Admitting: Family Medicine

## 2019-07-20 ENCOUNTER — Other Ambulatory Visit: Payer: Self-pay

## 2019-07-20 VITALS — BP 100/72 | HR 94 | Temp 98.1°F | Wt 134.6 lb

## 2019-07-20 DIAGNOSIS — E785 Hyperlipidemia, unspecified: Secondary | ICD-10-CM | POA: Diagnosis not present

## 2019-07-20 DIAGNOSIS — F329 Major depressive disorder, single episode, unspecified: Secondary | ICD-10-CM | POA: Diagnosis not present

## 2019-07-20 DIAGNOSIS — F32A Depression, unspecified: Secondary | ICD-10-CM

## 2019-07-20 DIAGNOSIS — N401 Enlarged prostate with lower urinary tract symptoms: Secondary | ICD-10-CM

## 2019-07-20 DIAGNOSIS — E1122 Type 2 diabetes mellitus with diabetic chronic kidney disease: Secondary | ICD-10-CM | POA: Diagnosis not present

## 2019-07-20 DIAGNOSIS — I10 Essential (primary) hypertension: Secondary | ICD-10-CM | POA: Diagnosis not present

## 2019-07-20 DIAGNOSIS — R351 Nocturia: Secondary | ICD-10-CM

## 2019-07-20 LAB — POCT GLYCOSYLATED HEMOGLOBIN (HGB A1C): Hemoglobin A1C: 7.3 % — AB (ref 4.0–5.6)

## 2019-07-20 LAB — GLUCOSE, POCT (MANUAL RESULT ENTRY): POC Glucose: 315 mg/dl — AB (ref 70–99)

## 2019-07-20 MED ORDER — TAMSULOSIN HCL 0.4 MG PO CAPS: ORAL_CAPSULE | ORAL | 1 refills | Status: DC

## 2019-07-20 MED ORDER — METOPROLOL SUCCINATE ER 25 MG PO TB24: 12.5000 mg | ORAL_TABLET | Freq: Every day | ORAL | 1 refills | Status: DC

## 2019-07-20 MED ORDER — CITALOPRAM HYDROBROMIDE 20 MG PO TABS: ORAL_TABLET | ORAL | 1 refills | Status: DC

## 2019-07-20 MED ORDER — PIOGLITAZONE HCL 30 MG PO TABS: ORAL_TABLET | ORAL | 1 refills | Status: DC

## 2019-07-20 MED ORDER — ATORVASTATIN CALCIUM 80 MG PO TABS: ORAL_TABLET | ORAL | 1 refills | Status: DC

## 2019-07-20 NOTE — Patient Instructions (Addendum)
Decrease metoprolol to 1/2 pill per day. Keep a record of your blood pressures outside of the office and bring them to the next office visit with nephrologist.   miralax ok if needed, but discuss medicine for constipation with your kidney specialist. Make sure to drink fluids and fiber in diet.   A1c looks ok today, but sugar is elevated. Keep a record of your blood pressures outside of the office for next 1 week - fasting and 2 hour after meal reading.  Will discuss those at follow up next week. If nausea/vomiting/new blurry vision, or blood sugar over 350 be seen sooner.   Return to the clinic or go to the nearest emergency room if any of your symptoms worsen or new symptoms occur.    Diabetes Mellitus and Nutrition, Adult When you have diabetes (diabetes mellitus), it is very important to have healthy eating habits because your blood sugar (glucose) levels are greatly affected by what you eat and drink. Eating healthy foods in the appropriate amounts, at about the same times every day, can help you:  Control your blood glucose.  Lower your risk of heart disease.  Improve your blood pressure.  Reach or maintain a healthy weight. Every person with diabetes is different, and each person has different needs for a meal plan. Your health care provider may recommend that you work with a diet and nutrition specialist (dietitian) to make a meal plan that is best for you. Your meal plan may vary depending on factors such as:  The calories you need.  The medicines you take.  Your weight.  Your blood glucose, blood pressure, and cholesterol levels.  Your activity level.  Other health conditions you have, such as heart or kidney disease. How do carbohydrates affect me? Carbohydrates, also called carbs, affect your blood glucose level more than any other type of food. Eating carbs naturally raises the amount of glucose in your blood. Carb counting is a method for keeping track of how many  carbs you eat. Counting carbs is important to keep your blood glucose at a healthy level, especially if you use insulin or take certain oral diabetes medicines. It is important to know how many carbs you can safely have in each meal. This is different for every person. Your dietitian can help you calculate how many carbs you should have at each meal and for each snack. Foods that contain carbs include:  Bread, cereal, rice, pasta, and crackers.  Potatoes and corn.  Peas, beans, and lentils.  Milk and yogurt.  Fruit and juice.  Desserts, such as cakes, cookies, ice cream, and candy. How does alcohol affect me? Alcohol can cause a sudden decrease in blood glucose (hypoglycemia), especially if you use insulin or take certain oral diabetes medicines. Hypoglycemia can be a life-threatening condition. Symptoms of hypoglycemia (sleepiness, dizziness, and confusion) are similar to symptoms of having too much alcohol. If your health care provider says that alcohol is safe for you, follow these guidelines:  Limit alcohol intake to no more than 1 drink per day for nonpregnant women and 2 drinks per day for men. One drink equals 12 oz of beer, 5 oz of wine, or 1 oz of hard liquor.  Do not drink on an empty stomach.  Keep yourself hydrated with water, diet soda, or unsweetened iced tea.  Keep in mind that regular soda, juice, and other mixers may contain a lot of sugar and must be counted as carbs. What are tips for following this  plan?  Reading food labels  Start by checking the serving size on the "Nutrition Facts" label of packaged foods and drinks. The amount of calories, carbs, fats, and other nutrients listed on the label is based on one serving of the item. Many items contain more than one serving per package.  Check the total grams (g) of carbs in one serving. You can calculate the number of servings of carbs in one serving by dividing the total carbs by 15. For example, if a food has 30  g of total carbs, it would be equal to 2 servings of carbs.  Check the number of grams (g) of saturated and trans fats in one serving. Choose foods that have low or no amount of these fats.  Check the number of milligrams (mg) of salt (sodium) in one serving. Most people should limit total sodium intake to less than 2,300 mg per day.  Always check the nutrition information of foods labeled as "low-fat" or "nonfat". These foods may be higher in added sugar or refined carbs and should be avoided.  Talk to your dietitian to identify your daily goals for nutrients listed on the label. Shopping  Avoid buying canned, premade, or processed foods. These foods tend to be high in fat, sodium, and added sugar.  Shop around the outside edge of the grocery store. This includes fresh fruits and vegetables, bulk grains, fresh meats, and fresh dairy. Cooking  Use low-heat cooking methods, such as baking, instead of high-heat cooking methods like deep frying.  Cook using healthy oils, such as olive, canola, or sunflower oil.  Avoid cooking with butter, cream, or high-fat meats. Meal planning  Eat meals and snacks regularly, preferably at the same times every day. Avoid going long periods of time without eating.  Eat foods high in fiber, such as fresh fruits, vegetables, beans, and whole grains. Talk to your dietitian about how many servings of carbs you can eat at each meal.  Eat 4-6 ounces (oz) of lean protein each day, such as lean meat, chicken, fish, eggs, or tofu. One oz of lean protein is equal to: ? 1 oz of meat, chicken, or fish. ? 1 egg. ?  cup of tofu.  Eat some foods each day that contain healthy fats, such as avocado, nuts, seeds, and fish. Lifestyle  Check your blood glucose regularly.  Exercise regularly as told by your health care provider. This may include: ? 150 minutes of moderate-intensity or vigorous-intensity exercise each week. This could be brisk walking, biking, or water  aerobics. ? Stretching and doing strength exercises, such as yoga or weightlifting, at least 2 times a week.  Take medicines as told by your health care provider.  Do not use any products that contain nicotine or tobacco, such as cigarettes and e-cigarettes. If you need help quitting, ask your health care provider.  Work with a Social worker or diabetes educator to identify strategies to manage stress and any emotional and social challenges. Questions to ask a health care provider  Do I need to meet with a diabetes educator?  Do I need to meet with a dietitian?  What number can I call if I have questions?  When are the best times to check my blood glucose? Where to find more information:  American Diabetes Association: diabetes.org  Academy of Nutrition and Dietetics: www.eatright.[employer] of Diabetes and Digestive and Kidney Diseases (NIH): [employer] Summary  A healthy meal plan will help you control your blood glucose and maintain  a healthy lifestyle.  Working with a diet and nutrition specialist (dietitian) can help you make a meal plan that is best for you.  Keep in mind that carbohydrates (carbs) and alcohol have immediate effects on your blood glucose levels. It is important to count carbs and to use alcohol carefully. This information is not intended to replace advice given to you by your health care provider. Make sure you discuss any questions you have with your health care provider. Document Revised: 06/10/2017 Document Reviewed: 08/02/2016 Elsevier Patient Education  [place].    Constipation, Adult Constipation is when a person has fewer bowel movements in a week than normal, has difficulty having a bowel movement, or has stools that are dry, hard, or larger than normal. Constipation may be caused by an underlying condition. It may become worse with age if a person takes certain medicines and does not take in enough fluids. Follow these  instructions at home: Eating and drinking   Eat foods that have a lot of fiber, such as fresh fruits and vegetables, whole grains, and beans.  Limit foods that are high in fat, low in fiber, or overly processed, such as french fries, hamburgers, cookies, candies, and soda.  Drink enough fluid to keep your urine clear or pale yellow. General instructions  Exercise regularly or as told by your health care provider.  Go to the restroom when you have the urge to go. Do not hold it in.  Take over-the-counter and prescription medicines only as told by your health care provider. These include any fiber supplements.  Practice pelvic floor retraining exercises, such as deep breathing while relaxing the lower abdomen and pelvic floor relaxation during bowel movements.  Watch your condition for any changes.  Keep all follow-up visits as told by your health care provider. This is important. Contact a health care provider if:  You have pain that gets worse.  You have a fever.  You do not have a bowel movement after 4 days.  You vomit.  You are not hungry.  You lose weight.  You are bleeding from the anus.  You have thin, pencil-like stools. Get help right away if:  You have a fever and your symptoms suddenly get worse.  You leak stool or have blood in your stool.  Your abdomen is bloated.  You have severe pain in your abdomen.  You feel dizzy or you faint. This information is not intended to replace advice given to you by your health care provider. Make sure you discuss any questions you have with your health care provider. Document Revised: 06/10/2017 Document Reviewed: 12/17/2015 Elsevier Patient Education  [employer].   If you have lab work done today you will be contacted with your lab results within the next 2 weeks.  If you have not heard from [place] then please contact us. The fastest way to get your results is to register for My Chart.   IF you received an  x-ray today, you will receive an invoice from [facility]. Please contact [facility] at [phone] with questions or concerns regarding your invoice.   IF you received labwork today, you will receive an invoice from [facility]. Please contact [facility] at [phone] with questions or concerns regarding your invoice.   Our billing staff will not be able to assist you with questions regarding bills from these companies.  You will be contacted with the lab results as soon as they are available. The fastest way to get your results  is to activate your My Chart account. Instructions are located on the last page of this paperwork. If you have not heard from us regarding the results in 2 weeks, please contact this office.     

## 2019-07-20 NOTE — Progress Notes (Signed)
Subjective:  Patient ID: Adam Reid, male    DOB: February 06, 1946  Age: 74 y.o. MRN: [id number]  CC:  Chief Complaint  Patient presents with  . Medical Management of Chronic Issues    Patient stated his Bs (166 this am)has been elevatede and loss weight.(last visit was 143 today 134.6)    HPI Adam Reid presents for   Spouse translating.   Diabetes: With chronic kidney disease.  Stage IV chronic kidney disease, nephrology Dr. [name].  Glipizide 10 mg daily Actos 30 mg daily.  He is on statin with Lipitor 80 mg daily.  Added low-dose Januvia 25 mg daily at last visit in October 2020, renally dosed Home readings: Fasting:181, 177, 160s, single episode of 200, usually under 200. Fasting 166.  Lowest 91, 97 few weeks ago with cough illness - less appetite.   no true symptomatic lows.  2 hour postprandials 260 usually, reading in 300 range few weeks ago.  No missed doses of meds. No new side effects with new med.   appt on 1/19 with nephrology. Not yet on on dialysis.   Microalbumin:  Lab Results  Component Value Date   LABMICR 509.1 04/13/2019   LABMICR 724.3 01/23/2018   MICROALBUR 60.3 09/02/2015   Optho, foot exam, pneumovax: Up-to-date, foot exam today.  Diabetic Foot Exam - Simple   Simple Foot Form Diabetic Foot exam was performed with the following findings: Yes 07/20/2019  2:08 PM  Visual Inspection Sensation Testing Pulse Check Comments    Lab Results  Component Value Date   HGBA1C 7.3 (A) 07/20/2019   HGBA1C 7.8 (A) 04/13/2019   HGBA1C 8.0 (A) 12/19/2018   Lab Results  Component Value Date   MICROALBUR 60.3 09/02/2015   LDLCALC 98 12/19/2018   CREATININE 2.44 (H) 12/19/2018   BPH: flomax 0.'8mg'$  qd. Urinating ok - still making urine. flomax has been helpful  Occasional constipation at times- has used miralax with some improvement.  Hypertension: Metoprolol '25mg'$  qd.  Home readings: 110-112/70-75.  Occasional dizziness.  BP Readings from Last 3  Encounters:  07/20/19 100/72  04/13/19 104/61  12/19/18 102/76   Lab Results  Component Value Date   CREATININE 2.44 (H) 12/19/2018   Depression: Stable/controlled on celexa.   Depression screen [facility] 2/9 07/20/2019 07/04/2019 04/13/2019 12/19/2018 09/19/2018  Decreased Interest 0 0 0 0 0  Down, Depressed, Hopeless 0 0 0 0 0  PHQ - 2 Score 0 0 0 0 0  Altered sleeping - - - - -  Tired, decreased energy - - - - -  Change in appetite - - - - -  Feeling bad or failure about yourself  - - - - -  Trouble concentrating - - - - -  Moving slowly or fidgety/restless - - - - -  Suicidal thoughts - - - - -  PHQ-9 Score - - - - -  Difficult doing work/chores - - - - -       History Patient Active Problem List   Diagnosis Date Noted  . Secondary hyperparathyroidism, renal ([place]) 12/29/2016  . CAD (coronary artery disease), native coronary artery 08/02/2016  . Smoker 03/26/2016  . Hypotension 01/29/2016  . Abnormal myocardial perfusion study 01/29/2016  . Depression 12/09/2015  . Hyperlipidemia LDL goal <100 06/28/2014  . Diabetes mellitus with renal manifestations, controlled ([place])   . HTN (hypertension) 06/24/2014  . 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 BPH (benign prostatic hyperplasia) 06/24/2014  Past Medical History:  Diagnosis Date  . Chronic kidney disease   . Diabetes mellitus without complication ([place])   . Hyperlipidemia   . Hypertension   . Thyroid disease    was on supplement, taken off by [name]   Past Surgical History:  Procedure Laterality Date  . APPENDECTOMY    . CARDIAC CATHETERIZATION N/A 01/30/2016   Procedure: Left Heart Cath and Coronary Angiography;  Surgeon: [name], MD;  Location: [place] [facility];  Service: Cardiovascular;  Laterality: N/A;   No Known Allergies Prior to Admission medications   Medication Sig Start Date End Date Taking? Authorizing Provider  albuterol (VENTOLIN HFA)  108 (90 Base) MCG/ACT inhaler Inhale 2 puffs into the lungs every 6 (six) hours as needed for wheezing or shortness of breath. 07/02/19  Yes Maximiano Coss, NP  aspirin EC 81 MG EC tablet Take 1 tablet (81 mg total) by mouth daily. Patient taking differently: Take 81 mg by mouth every other day.  12/03/15  Yes Mayo, Pete Pelt, MD  atorvastatin (LIPITOR) 80 MG tablet TAKE 1 TABLET(80 MG) BY MOUTH DAILY 03/16/19  Yes [name], MD  blood glucose meter kit and supplies Use up to two times daily as directed  ICD10 E10.9 E11.9 02/20/19  Yes [name], MD  Blood Glucose Monitoring Suppl (ONETOUCH VERIO) w/Device KIT USE UTD 09/03/15  Yes [provider]  calcitRIOL (ROCALTROL) 0.25 MCG capsule  09/09/17  Yes [provider]  Cholecalciferol (VITAMIN D-3 PO) Take 1 tablet by mouth See admin instructions. Three times a week. No specific days   Yes [provider]  citalopram (CELEXA) 20 MG tablet TAKE 1 TABLET(20 MG) BY MOUTH DAILY 07/02/19  Yes Maximiano Coss, NP  cyanocobalamin 500 MCG tablet Take 500 mcg by mouth daily. Reported on 12/02/2015   Yes [provider]  glipiZIDE (GLUCOTROL XL) 10 MG 24 hr tablet Take 1 tablet (10 mg total) by mouth daily with breakfast. 07/02/19  Yes Maximiano Coss, NP  GlucoCom Lancets MISC Use for home glucose monitoring 09/02/15  Yes Jeffery, Chelle, PA  glucose blood (ONETOUCH VERIO) test strip Test up to 2 times per day.  Uncontrolled diabetes with hyperglycemia and stage 4 CKD. 04/13/19  Yes [name], MD  metoprolol succinate (TOPROL-XL) 25 MG 24 hr tablet TAKE 1 TABLET(25 MG) BY MOUTH DAILY 01/03/19  Yes [name], MD  nitroGLYCERIN (NITROSTAT) 0.4 MG SL tablet Place 1 tablet (0.4 mg total) under the tongue every 5 (five) minutes x 3 doses as needed for chest pain. 01/31/16  Yes [name] E, NP  pioglitazone (ACTOS) 30 MG tablet TAKE 1 TABLET(30 MG) BY MOUTH DAILY 07/09/19  Yes [name], MD   sitaGLIPtin (JANUVIA) 25 MG tablet Take 1 tablet (25 mg total) by mouth daily. 07/09/19  Yes [name], MD  tamsulosin (FLOMAX) 0.4 MG CAPS capsule TAKE 2 CAPSULES(0.8 MG) BY MOUTH DAILY 02/12/19  Yes [name], MD   Social History   Socioeconomic History  . Marital status: Married    Spouse name: [name]  . Number of children: 1  . Years of education: 12th grade  . Highest education level: Not on file  Occupational History  . Occupation: Retired-accountant  Tobacco Use  . Smoking status: Current Every Day Smoker    Packs/day: 0.25    Years: 48.00    Pack years: 12.00    Types: Cigarettes  . Smokeless tobacco: Never Used  .  Tobacco comment: 09/07/16 3-4 cigs daily, trying to cut back  Substance and Sexual Activity  . Alcohol use: No    Alcohol/week: 0.0 standard drinks  . Drug use: No  . Sexual activity: Never    Partners: Female  Other Topics Concern  . Not on file  Social History Narrative   Originally from [place]. Came to the [place] in 2009, following their son who came here for school.   Married.   Lives with his wife.   Their adult son lives in [place], [place], where he is in optometry school.   Education: [employer]   Exercise: No   Social Determinants of Radio broadcast assistant Strain:   . Difficulty of Paying Living Expenses: Not on file  Food Insecurity:   . Worried About Charity fundraiser in the Last Year: Not on file  . Ran Out of Food in the Last Year: Not on file  Transportation Needs:   . Lack of Transportation (Medical): Not on file  . Lack of Transportation (Non-Medical): Not on file  Physical Activity:   . Days of Exercise per Week: Not on file  . Minutes of Exercise per Session: Not on file  Stress:   . Feeling of Stress : Not on file  Social Connections:   . Frequency of Communication with Friends and Family: Not on file  . Frequency of Social Gatherings with Friends and Family: Not on file  . Attends Religious Services: Not on file   . Active Member of Clubs or Organizations: Not on file  . Attends Archivist Meetings: Not on file  . Marital Status: Not on file  Intimate Partner Violence:   . Fear of Current or Ex-Partner: Not on file  . Emotionally Abused: Not on file  . Physically Abused: Not on file  . Sexually Abused: Not on file    Review of Systems  Constitutional: Negative for fatigue and unexpected weight change.  Eyes: Negative for visual disturbance.  Respiratory: Negative for cough, chest tightness and shortness of breath.   Cardiovascular: Negative for chest pain, palpitations and leg swelling.  Gastrointestinal: Positive for constipation. Negative for abdominal pain and blood in stool.  Neurological: Positive for light-headedness (occasional lightheadedness. not persistent. ). Negative for dizziness and headaches.     Objective:   Vitals:   07/20/19 1316  BP: 100/72  Pulse: 94  Temp: 98.1 F (36.7 C)  TempSrc: Temporal  SpO2: 98%  Weight: 134 lb 9.6 oz (61.1 kg)     Physical Exam Vitals reviewed.  Constitutional:      Appearance: He is well-developed.  HENT:     Head: Normocephalic and atraumatic.  Eyes:     Pupils: Pupils are equal, round, and reactive to light.  Neck:     Vascular: No carotid bruit or JVD.  Cardiovascular:     Rate and Rhythm: Normal rate and regular rhythm.     Heart sounds: Normal heart sounds. No murmur.  Pulmonary:     Effort: Pulmonary effort is normal.     Breath sounds: Normal breath sounds. No rales.  Skin:    General: Skin is warm and dry.  Neurological:     Mental Status: He is alert and oriented to person, place, and time.    Results for orders placed or performed in visit on 07/20/19  POCT glucose (manual entry)  Result Value Ref Range   POC Glucose 315 (A) 70 - 99 mg/dl  POCT glycosylated hemoglobin (Hb  A1C)  Result Value Ref Range   Hemoglobin A1C 7.3 (A) 4.0 - 5.6 %   HbA1c POC (<> result, manual entry)     HbA1c, POC  (prediabetic range)     HbA1c, POC (controlled diabetic range)       Assessment & Plan:  Nehemyah Foushee is a 74 y.o. male . Type 2 diabetes mellitus with chronic kidney disease, without long-term current use of insulin, unspecified CKD stage ([place]) - Plan: HM Diabetes Foot Exam, POCT glucose (manual entry), POCT glycosylated hemoglobin (Hb A1C), pioglitazone (ACTOS) 30 MG tablet  - elevated in office, overall stable aic, variable home readings. Possible diet component.   - no changes for now - home monitoring next week, dieth handout given. Consider med change after upcoming med review. meds limited with CKD. Ultimately may need to consider insulin. RTC/ER precautions.   Hyperlipidemia, unspecified hyperlipidemia type - Plan: Comprehensive metabolic panel, Lipid panel, atorvastatin (LIPITOR) 80 MG tablet  -  Stable, tolerating current regimen. Medications refilled. Labs pending as above.   Depression, unspecified depression type - Plan: citalopram (CELEXA) 20 MG tablet  - stable. Continue same dose celexa.   Essential hypertension - Plan: metoprolol succinate (TOPROL-XL) 25 MG 24 hr tablet  - borderline low - decreased to 1/2 tab qd.   Benign prostatic hyperplasia with nocturia - Plan: tamsulosin (FLOMAX) 0.4 MG CAPS capsule  - stable, no changes for now.   Constipation treatment reviewed as prior with rtc precautions.   Meds ordered this encounter  Medications  . atorvastatin (LIPITOR) 80 MG tablet    Sig: TAKE 1 TABLET(80 MG) BY MOUTH DAILY    Dispense:  90 tablet    Refill:  1  . citalopram (CELEXA) 20 MG tablet    Sig: TAKE 1 TABLET(20 MG) BY MOUTH DAILY    Dispense:  90 tablet    Refill:  1  . metoprolol succinate (TOPROL-XL) 25 MG 24 hr tablet    Sig: Take 0.5 tablets (12.5 mg total) by mouth daily.    Dispense:  45 tablet    Refill:  1  . pioglitazone (ACTOS) 30 MG tablet    Sig: TAKE 1 TABLET(30 MG) BY MOUTH DAILY    Dispense:  90 tablet    Refill:  1  . tamsulosin  (FLOMAX) 0.4 MG CAPS capsule    Sig: TAKE 2 CAPSULES(0.8 MG) BY MOUTH DAILY    Dispense:  180 capsule    Refill:  1   Patient Instructions     Decrease metoprolol to 1/2 pill per day. Keep a record of your blood pressures outside of the office and bring them to the next office visit with nephrologist.   miralax ok if needed, but discuss medicine for constipation with your kidney specialist. Make sure to drink fluids and fiber in diet.   A1c looks ok today, but sugar is elevated. Keep a record of your blood pressures outside of the office for next 1 week - fasting and 2 hour after meal reading.  Will discuss those at follow up next week. If nausea/vomiting/new blurry vision, or blood sugar over 350 be seen sooner.   Return to the clinic or go to the nearest emergency room if any of your symptoms worsen or new symptoms occur.    Diabetes Mellitus and Nutrition, Adult When you have diabetes (diabetes mellitus), it is very important to have healthy eating habits because your blood sugar (glucose) levels are greatly affected by what you eat and drink. Eating healthy  foods in the appropriate amounts, at about the same times every day, can help you:  Control your blood glucose.  Lower your risk of heart disease.  Improve your blood pressure.  Reach or maintain a healthy weight. Every person with diabetes is different, and each person has different needs for a meal plan. Your health care provider may recommend that you work with a diet and nutrition specialist (dietitian) to make a meal plan that is best for you. Your meal plan may vary depending on factors such as:  The calories you need.  The medicines you take.  Your weight.  Your blood glucose, blood pressure, and cholesterol levels.  Your activity level.  Other health conditions you have, such as heart or kidney disease. How do carbohydrates affect me? Carbohydrates, also called carbs, affect your blood glucose level more than  any other type of food. Eating carbs naturally raises the amount of glucose in your blood. Carb counting is a method for keeping track of how many carbs you eat. Counting carbs is important to keep your blood glucose at a healthy level, especially if you use insulin or take certain oral diabetes medicines. It is important to know how many carbs you can safely have in each meal. This is different for every person. Your dietitian can help you calculate how many carbs you should have at each meal and for each snack. Foods that contain carbs include:  Bread, cereal, rice, pasta, and crackers.  Potatoes and corn.  Peas, beans, and lentils.  Milk and yogurt.  Fruit and juice.  Desserts, such as cakes, cookies, ice cream, and candy. How does alcohol affect me? Alcohol can cause a sudden decrease in blood glucose (hypoglycemia), especially if you use insulin or take certain oral diabetes medicines. Hypoglycemia can be a life-threatening condition. Symptoms of hypoglycemia (sleepiness, dizziness, and confusion) are similar to symptoms of having too much alcohol. If your health care provider says that alcohol is safe for you, follow these guidelines:  Limit alcohol intake to no more than 1 drink per day for nonpregnant women and 2 drinks per day for men. One drink equals 12 oz of beer, 5 oz of wine, or 1 oz of hard liquor.  Do not drink on an empty stomach.  Keep yourself hydrated with water, diet soda, or unsweetened iced tea.  Keep in mind that regular soda, juice, and other mixers may contain a lot of sugar and must be counted as carbs. What are tips for following this plan?  Reading food labels  Start by checking the serving size on the "Nutrition Facts" label of packaged foods and drinks. The amount of calories, carbs, fats, and other nutrients listed on the label is based on one serving of the item. Many items contain more than one serving per package.  Check the total grams (g) of carbs  in one serving. You can calculate the number of servings of carbs in one serving by dividing the total carbs by 15. For example, if a food has 30 g of total carbs, it would be equal to 2 servings of carbs.  Check the number of grams (g) of saturated and trans fats in one serving. Choose foods that have low or no amount of these fats.  Check the number of milligrams (mg) of salt (sodium) in one serving. Most people should limit total sodium intake to less than 2,300 mg per day.  Always check the nutrition information of foods labeled as "low-fat" or "nonfat". These  foods may be higher in added sugar or refined carbs and should be avoided.  Talk to your dietitian to identify your daily goals for nutrients listed on the label. Shopping  Avoid buying canned, premade, or processed foods. These foods tend to be high in fat, sodium, and added sugar.  Shop around the outside edge of the grocery store. This includes fresh fruits and vegetables, bulk grains, fresh meats, and fresh dairy. Cooking  Use low-heat cooking methods, such as baking, instead of high-heat cooking methods like deep frying.  Cook using healthy oils, such as olive, canola, or sunflower oil.  Avoid cooking with butter, cream, or high-fat meats. Meal planning  Eat meals and snacks regularly, preferably at the same times every day. Avoid going long periods of time without eating.  Eat foods high in fiber, such as fresh fruits, vegetables, beans, and whole grains. Talk to your dietitian about how many servings of carbs you can eat at each meal.  Eat 4-6 ounces (oz) of lean protein each day, such as lean meat, chicken, fish, eggs, or tofu. One oz of lean protein is equal to: ? 1 oz of meat, chicken, or fish. ? 1 egg. ?  cup of tofu.  Eat some foods each day that contain healthy fats, such as avocado, nuts, seeds, and fish. Lifestyle  Check your blood glucose regularly.  Exercise regularly as told by your health care  provider. This may include: ? 150 minutes of moderate-intensity or vigorous-intensity exercise each week. This could be brisk walking, biking, or water aerobics. ? Stretching and doing strength exercises, such as yoga or weightlifting, at least 2 times a week.  Take medicines as told by your health care provider.  Do not use any products that contain nicotine or tobacco, such as cigarettes and e-cigarettes. If you need help quitting, ask your health care provider.  Work with a Social worker or diabetes educator to identify strategies to manage stress and any emotional and social challenges. Questions to ask a health care provider  Do I need to meet with a diabetes educator?  Do I need to meet with a dietitian?  What number can I call if I have questions?  When are the best times to check my blood glucose? Where to find more information:  American Diabetes Association: diabetes.org  Academy of Nutrition and Dietetics: www.eatright.[employer] of Diabetes and Digestive and Kidney Diseases (NIH): [employer] Summary  A healthy meal plan will help you control your blood glucose and maintain a healthy lifestyle.  Working with a diet and nutrition specialist (dietitian) can help you make a meal plan that is best for you.  Keep in mind that carbohydrates (carbs) and alcohol have immediate effects on your blood glucose levels. It is important to count carbs and to use alcohol carefully. This information is not intended to replace advice given to you by your health care provider. Make sure you discuss any questions you have with your health care provider. Document Revised: 06/10/2017 Document Reviewed: 08/02/2016 Elsevier Patient Education  [place].    Constipation, Adult Constipation is when a person has fewer bowel movements in a week than normal, has difficulty having a bowel movement, or has stools that are dry, hard, or larger than normal. Constipation may  be caused by an underlying condition. It may become worse with age if a person takes certain medicines and does not take in enough fluids. Follow these instructions at home: Eating and drinking   Eat foods  that have a lot of fiber, such as fresh fruits and vegetables, whole grains, and beans.  Limit foods that are high in fat, low in fiber, or overly processed, such as french fries, hamburgers, cookies, candies, and soda.  Drink enough fluid to keep your urine clear or pale yellow. General instructions  Exercise regularly or as told by your health care provider.  Go to the restroom when you have the urge to go. Do not hold it in.  Take over-the-counter and prescription medicines only as told by your health care provider. These include any fiber supplements.  Practice pelvic floor retraining exercises, such as deep breathing while relaxing the lower abdomen and pelvic floor relaxation during bowel movements.  Watch your condition for any changes.  Keep all follow-up visits as told by your health care provider. This is important. Contact a health care provider if:  You have pain that gets worse.  You have a fever.  You do not have a bowel movement after 4 days.  You vomit.  You are not hungry.  You lose weight.  You are bleeding from the anus.  You have thin, pencil-like stools. Get help right away if:  You have a fever and your symptoms suddenly get worse.  You leak stool or have blood in your stool.  Your abdomen is bloated.  You have severe pain in your abdomen.  You feel dizzy or you faint. This information is not intended to replace advice given to you by your health care provider. Make sure you discuss any questions you have with your health care provider. Document Revised: 06/10/2017 Document Reviewed: 12/17/2015 Elsevier Patient Education  [employer].   If you have lab work done today you will be contacted with your lab results within the next 2  weeks.  If you have not heard from [place] then please contact us. The fastest way to get your results is to register for My Chart.   IF you received an x-ray today, you will receive an invoice from [facility]. Please contact [facility] at [phone] with questions or concerns regarding your invoice.   IF you received labwork today, you will receive an invoice from [facility]. Please contact [facility] at [phone] with questions or concerns regarding your invoice.   Our billing staff will not be able to assist you with questions regarding bills from these companies.  You will be contacted with the lab results as soon as they are available. The fastest way to get your results is to activate your My Chart account. Instructions are located on the last page of this paperwork. If you have not heard from [place] regarding the results in 2 weeks, please contact this office.          Signed, Merri Ray, MD [facility]

## 2019-07-21 LAB — COMPREHENSIVE METABOLIC PANEL
ALT: 13 IU/L (ref 0–44)
AST: 15 IU/L (ref 0–40)
Albumin/Globulin Ratio: 1.3 (ref 1.2–2.2)
Albumin: 3.8 g/dL (ref 3.7–4.7)
Alkaline Phosphatase: 91 IU/L (ref 39–117)
BUN/Creatinine Ratio: 23 (ref 10–24)
BUN: 57 mg/dL — ABNORMAL HIGH (ref 8–27)
Bilirubin Total: 0.3 mg/dL (ref 0.0–1.2)
CO2: 21 mmol/L (ref 20–29)
Calcium: 9.7 mg/dL (ref 8.6–10.2)
Chloride: 97 mmol/L (ref 96–106)
Creatinine, Ser: 2.47 mg/dL — ABNORMAL HIGH (ref 0.76–1.27)
GFR calc Af Amer: 29 mL/min/{1.73_m2} — ABNORMAL LOW (ref >59)
GFR calc non Af Amer: 25 mL/min/{1.73_m2} — ABNORMAL LOW (ref >59)
Globulin, Total: 2.9 g/dL (ref 1.5–4.5)
Glucose: 292 mg/dL — ABNORMAL HIGH (ref 65–99)
Potassium: 4.7 mmol/L (ref 3.5–5.2)
Sodium: 137 mmol/L (ref 134–144)
Total Protein: 6.7 g/dL (ref 6.0–8.5)

## 2019-07-21 LAB — LIPID PANEL
Chol/HDL Ratio: 2 ratio (ref 0.0–5.0)
Cholesterol, Total: 159 mg/dL (ref 100–199)
HDL: 80 mg/dL (ref >39)
LDL Chol Calc (NIH): 57 mg/dL (ref 0–99)
Triglycerides: 127 mg/dL (ref 0–149)
VLDL Cholesterol Cal: 22 mg/dL (ref 5–40)

## 2019-07-27 ENCOUNTER — Other Ambulatory Visit: Payer: Self-pay

## 2019-07-27 ENCOUNTER — Telehealth (INDEPENDENT_AMBULATORY_CARE_PROVIDER_SITE_OTHER): Payer: Medicare Other | Admitting: Family Medicine

## 2019-07-27 ENCOUNTER — Encounter: Payer: Self-pay | Admitting: Family Medicine

## 2019-07-27 VITALS — BP 158/82 | HR 89 | Ht 66.0 in

## 2019-07-27 DIAGNOSIS — R739 Hyperglycemia, unspecified: Secondary | ICD-10-CM

## 2019-07-27 DIAGNOSIS — E1122 Type 2 diabetes mellitus with diabetic chronic kidney disease: Secondary | ICD-10-CM

## 2019-07-27 DIAGNOSIS — K5900 Constipation, unspecified: Secondary | ICD-10-CM

## 2019-07-27 MED ORDER — GLIPIZIDE 5 MG PO TABS: 2.5000 mg | ORAL_TABLET | Freq: Every day | ORAL | 1 refills | Status: DC

## 2019-07-27 NOTE — Patient Instructions (Addendum)
Cut back on honey on toast. If you continue to have 2 hour readings above 250 - then take additional 2.5mg  (1/2 of the 5mg  pill that I prescribed tonight). Watch for low blood sugars on that med and we will recheck in 1 week.  Depending on plan with nephrology next week, may need to consider other changes.   See if suppository helps to have bowel movement tonight. Ok to increase miralax to twice per day. Sit on the toilet after every meal for about 15 minutes to see if that helps to have bowel movement easier. Discuss other constipation treatment options with Dr. [name] on Tuesday that are ok with your kidney disease. Follow up in office in 2 days if unable to have bowel movement this weekend.       If you have lab work done today you will be contacted with your lab results within the next 2 weeks.  If you have not heard from [place] then please contact us. The fastest way to get your results is to register for My Chart.   IF you received an x-ray today, you will receive an invoice from [facility]. Please contact [facility] at [phone] with questions or concerns regarding your invoice.   IF you received labwork today, you will receive an invoice from [facility]. Please contact [facility] at [phone] with questions or concerns regarding your invoice.   Our billing staff will not be able to assist you with questions regarding bills from these companies.  You will be contacted with the lab results as soon as they are available. The fastest way to get your results is to activate your My Chart account. Instructions are located on the last page of this paperwork. If you have not heard from [place] regarding the results in 2 weeks, please contact this office.

## 2019-07-27 NOTE — Progress Notes (Signed)
DM f/u   Constipation- gotten worst with senna and miralax is not work. No BM in 4 days

## 2019-07-27 NOTE — Progress Notes (Signed)
Virtual Visit via Video Note  I connected with Adam Reid on 07/27/19 at 6:51 PM by a video enabled telemedicine application and verified that I am speaking with the correct person using two identifiers.   I discussed the limitations, risks, security and privacy concerns of performing an evaluation and management service by telephone and the availability of in person appointments. I also discussed with the patient that there may be a patient responsible charge related to this service. The patient expressed understanding and agreed to proceed, consent obtained,.   Wife on call as well to translate.  consent obtained   Chief complaint: Diabetes, constipation.    History of Present Illness: Adam Reid is a 74 y.o. male  Diabetes: Complicated by chronic kidney disease.   Stage IV chronic kidney disease, followed by nephrologist Dr. [name]. - appt this Tuesday.  Currently taking glipizide 10 mg daily with breakfast, Actos 30 mg daily with breakfast.  He is on statin with Lipitor, and Januvia 25 mg with breakfast. Januvia was added in October, renally dosed. Borderline control by last A1c of 7.3, but did report some significant variability in home readings including 200-300 weeks prior. Reading of 292 at last visit.   Past week readings: Fasting:179, 203, 167, 187, 174, 198, 163 2hr pp (all checked after breakfast). :189, 368 (after breakfast), 242, 261, 287, 239, 300.  Breakfast - similar breakfast each day - boiled egg, tea, 2 pieces of toast, cheese, honey on toast - no sugar added.   Denies missed doses of meds.  Has been taking meds at different times of the day.   Lab Results  Component Value Date   HGBA1C 7.3 (A) 07/20/2019   HGBA1C 7.8 (A) 04/13/2019   HGBA1C 8.0 (A) 12/19/2018   Lab Results  Component Value Date   MICROALBUR 60.3 09/02/2015   LDLCALC 57 07/20/2019   CREATININE 2.47 (H) 07/20/2019   Constipation: Ongoing issue. miralax discussed in past, handout  on prevention techniques, but also advised discussing options with nephrology at 07/20/19 visit.  Has been using miralax since last visit- once per day. 2 senna every night.  Dulcolax suppository once this morning.  He feels like he needs to have BM tonight.  Drinking fluids. Urinating multiple times per day - light colored. Last BM 4 days ago. Hard stool, small stool at that time.  No abdominal pain, no nausea or vomiting.    Patient Active Problem List   Diagnosis Date Noted  . Secondary hyperparathyroidism, renal ([place]) 12/29/2016  . CAD (coronary artery disease), native coronary artery 08/02/2016  . Smoker 03/26/2016  . Hypotension 01/29/2016  . Abnormal myocardial perfusion study 01/29/2016  . Depression 12/09/2015  . Hyperlipidemia LDL goal <100 06/28/2014  . Diabetes mellitus with renal manifestations, controlled ([place])   . HTN (hypertension) 06/24/2014  . 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 BPH (benign prostatic hyperplasia) 06/24/2014   Past Medical History:  Diagnosis Date  . Chronic kidney disease   . Diabetes mellitus without complication ([place])   . Hyperlipidemia   . Hypertension   . Thyroid disease    was on supplement, taken off by [name]   Past Surgical History:  Procedure Laterality Date  . APPENDECTOMY    . CARDIAC CATHETERIZATION N/A 01/30/2016   Procedure: Left Heart Cath and Coronary Angiography;  Surgeon: [name], MD;  Location: [facility];  Service: Cardiovascular;  Laterality: N/A;   No Known Allergies  Prior to Admission medications   Medication Sig Start Date End Date Taking? Authorizing Provider  aspirin EC 81 MG EC tablet Take 1 tablet (81 mg total) by mouth daily. Patient taking differently: Take 81 mg by mouth every other day.  12/03/15  Yes Mayo, Pete Pelt, MD  atorvastatin (LIPITOR) 80 MG tablet TAKE 1 TABLET(80 MG) BY MOUTH DAILY 07/20/19  Yes [name], MD  blood  glucose meter kit and supplies Use up to two times daily as directed  ICD10 E10.9 E11.9 02/20/19  Yes [name], MD  Blood Glucose Monitoring Suppl (ONETOUCH VERIO) w/Device KIT USE UTD 09/03/15  Yes [provider]  calcitRIOL (ROCALTROL) 0.25 MCG capsule  09/09/17  Yes [provider]  Cholecalciferol (VITAMIN D-3 PO) Take 1 tablet by mouth See admin instructions. Three times a week. No specific days   Yes [provider]  citalopram (CELEXA) 20 MG tablet TAKE 1 TABLET(20 MG) BY MOUTH DAILY 07/20/19  Yes [name], MD  cyanocobalamin 500 MCG tablet Take 500 mcg by mouth daily. Reported on 12/02/2015   Yes [provider]  glipiZIDE (GLUCOTROL XL) 10 MG 24 hr tablet Take 1 tablet (10 mg total) by mouth daily with breakfast. 07/02/19  Yes Maximiano Coss, NP  GlucoCom Lancets MISC Use for home glucose monitoring 09/02/15  Yes Jeffery, Chelle, PA  glucose blood (ONETOUCH VERIO) test strip Test up to 2 times per day.  Uncontrolled diabetes with hyperglycemia and stage 4 CKD. 04/13/19  Yes [name], MD  metoprolol succinate (TOPROL-XL) 25 MG 24 hr tablet Take 0.5 tablets (12.5 mg total) by mouth daily. 07/20/19  Yes [name], MD  nitroGLYCERIN (NITROSTAT) 0.4 MG SL tablet Place 1 tablet (0.4 mg total) under the tongue every 5 (five) minutes x 3 doses as needed for chest pain. 01/31/16  Yes [name] E, NP  pioglitazone (ACTOS) 30 MG tablet TAKE 1 TABLET(30 MG) BY MOUTH DAILY 07/20/19  Yes [name], MD  polyethylene glycol (MIRALAX / GLYCOLAX) 17 g packet Take 17 g by mouth daily.   Yes [provider]  senna (SENOKOT) 8.6 MG tablet Take 1 tablet by mouth daily.   Yes [provider]  sitaGLIPtin (JANUVIA) 25 MG tablet Take 1 tablet (25 mg total) by mouth daily. 07/09/19  Yes [name], MD  tamsulosin (FLOMAX) 0.4 MG CAPS capsule TAKE 2 CAPSULES(0.8 MG) BY MOUTH DAILY 07/20/19  Yes [name], MD  albuterol  (VENTOLIN HFA) 108 (90 Base) MCG/ACT inhaler Inhale 2 puffs into the lungs every 6 (six) hours as needed for wheezing or shortness of breath. Patient not taking: Reported on 07/27/2019 07/02/19   Maximiano Coss, NP   Social History   Socioeconomic History  . Marital status: Married    Spouse name: [name]  . Number of children: 1  . Years of education: 12th grade  . Highest education level: Not on file  Occupational History  . Occupation: Retired-accountant  Tobacco Use  . Smoking status: Current Every Day Smoker    Packs/day: 0.25    Years: 48.00    Pack years: 12.00    Types: Cigarettes  . Smokeless tobacco: Never Used  . Tobacco comment: 09/07/16 3-4 cigs daily, trying to cut back  Substance and Sexual Activity  . Alcohol use: No    Alcohol/week: 0.0 standard drinks  . Drug use: No  . Sexual activity: Never    Partners: Female  Other Topics Concern  .  Not on file  Social History Narrative   Originally from [place]. Came to the [place] in 2009, following their son who came here for school.   Married.   Lives with his wife.   Their adult son lives in [place], [place], where he is in optometry school.   Education: [employer]   Exercise: No   Social Determinants of Radio broadcast assistant Strain:   . Difficulty of Paying Living Expenses: Not on file  Food Insecurity:   . Worried About Charity fundraiser in the Last Year: Not on file  . Ran Out of Food in the Last Year: Not on file  Transportation Needs:   . Lack of Transportation (Medical): Not on file  . Lack of Transportation (Non-Medical): Not on file  Physical Activity:   . Days of Exercise per Week: Not on file  . Minutes of Exercise per Session: Not on file  Stress:   . Feeling of Stress : Not on file  Social Connections:   . Frequency of Communication with Friends and Family: Not on file  . Frequency of Social Gatherings with Friends and Family: Not on file  . Attends Religious Services: Not on file  . Active  Member of Clubs or Organizations: Not on file  . Attends Archivist Meetings: Not on file  . Marital Status: Not on file  Intimate Partner Violence:   . Fear of Current or Ex-Partner: Not on file  . Emotionally Abused: Not on file  . Physically Abused: Not on file  . Sexually Abused: Not on file    Observations/Objective: No distress.  Nontoxic appearance on video.  All questions were answered with interpretation by his spouse.  Assessment and Plan: Type 2 diabetes mellitus with chronic kidney disease, without long-term current use of insulin, unspecified CKD stage ([place]) - Plan: glipiZIDE (GLUCOTROL) 5 MG tablet Hyperglycemia - Plan: glipiZIDE (GLUCOTROL) 5 MG tablet  -Elevated postprandials, with overall stable A1c.  Recommended cutting back on honey with bread in the morning, add 2.5 mg of glipizide.  Nephrology follow-up planned in a few days, can also discuss regimen there but will depend on plans of CKD.  Consider insulin.  Hypoglycemic precautions given as increasing doses of glipizide.  Recheck 1 week by telemedicine visit  Constipation, unspecified constipation type  -Some increased urge tonight, no apparent signs of obstruction at this time, but differential includes fecal impaction.  Plans to try to have bowel movement at completion of phone call.  Bowel regimen discussed.  Option of MiraLAX twice per day.   - follow-up on Monday if unable to have bowel movement to evaluate for fecal impaction with ER/urgent care precautions over the weekend discussed..  Understanding expressed.  Also recommend discussing regimen with nephrologist in 4 days.   Follow Up Instructions:  1 week telemedicine   I discussed the assessment and treatment plan with the patient. The patient was provided an opportunity to ask questions and all were answered. The patient agreed with the plan and demonstrated an understanding of the instructions.   The patient was advised to call back or seek an  in-person evaluation if the symptoms worsen or if the condition fails to improve as anticipated.  I provided 35 minutes of non-face-to-face time during this encounter.   [name], MD

## 2019-07-29 ENCOUNTER — Other Ambulatory Visit: Payer: Self-pay | Admitting: Registered Nurse

## 2019-07-29 DIAGNOSIS — E118 Type 2 diabetes mellitus with unspecified complications: Secondary | ICD-10-CM

## 2019-07-30 NOTE — Telephone Encounter (Signed)
Refill ordered

## 2019-07-30 NOTE — Progress Notes (Signed)
Called pt. To reschedule visit. Wife answered phone and asked to be able to call back later today to reschedule visit after she got her work schedule.

## 2019-08-02 ENCOUNTER — Telehealth: Payer: Self-pay | Admitting: Family Medicine

## 2019-08-02 NOTE — Telephone Encounter (Signed)
Call received from patient's nephrologist, Dr. [name].  Concerns discussed regarding recent visit and decreasing hemoglobin.  Usual range had been in the mid 10 range. 10.1 in October 2020, 8.9 recently.  Low iron sat 9% total 28.  He is overdue for colonoscopy, 3-year follow-up planned in May 2017.  Dr. [name] will order a few doses of IV Feraheme as likely will not tolerate oral iron with his constipation (which he reported improving with MiraLAX), and will have him evaluated by gastroenterology for the drop in hemoglobin and need for colonoscopy, evaluation of GI source of anemia.  Creatinine was stable at 2.6. Advised that if referral is needed, I am happy to place that.  Nephrology initially planning on getting him seen by gastroenterology.

## 2019-08-09 ENCOUNTER — Other Ambulatory Visit (HOSPITAL_COMMUNITY): Payer: Self-pay

## 2019-08-10 ENCOUNTER — Other Ambulatory Visit: Payer: Self-pay

## 2019-08-10 ENCOUNTER — Ambulatory Visit (INDEPENDENT_AMBULATORY_CARE_PROVIDER_SITE_OTHER): Payer: Medicare Other | Admitting: Nurse Practitioner

## 2019-08-10 ENCOUNTER — Telehealth (INDEPENDENT_AMBULATORY_CARE_PROVIDER_SITE_OTHER): Payer: Medicare Other | Admitting: Family Medicine

## 2019-08-10 ENCOUNTER — Encounter: Payer: Self-pay | Admitting: Nurse Practitioner

## 2019-08-10 ENCOUNTER — Other Ambulatory Visit (INDEPENDENT_AMBULATORY_CARE_PROVIDER_SITE_OTHER): Payer: Medicare Other

## 2019-08-10 ENCOUNTER — Ambulatory Visit (HOSPITAL_COMMUNITY)
Admission: RE | Admit: 2019-08-10 | Discharge: 2019-08-10 | Disposition: A | Discharge status: Home / Self Care | Payer: Medicare Other | Source: Ambulatory Visit | Attending: Nephrology | Admitting: Nephrology

## 2019-08-10 VITALS — BP 118/64 | HR 96 | Temp 98.1°F | Ht 66.0 in | Wt 144.0 lb

## 2019-08-10 VITALS — BP 139/89 | Ht 66.0 in | Wt 143.0 lb

## 2019-08-10 DIAGNOSIS — N189 Chronic kidney disease, unspecified: Secondary | ICD-10-CM | POA: Insufficient documentation

## 2019-08-10 DIAGNOSIS — D631 Anemia in chronic kidney disease: Secondary | ICD-10-CM | POA: Insufficient documentation

## 2019-08-10 DIAGNOSIS — D508 Other iron deficiency anemias: Secondary | ICD-10-CM | POA: Diagnosis not present

## 2019-08-10 DIAGNOSIS — I10 Essential (primary) hypertension: Secondary | ICD-10-CM | POA: Diagnosis not present

## 2019-08-10 DIAGNOSIS — D509 Iron deficiency anemia, unspecified: Secondary | ICD-10-CM | POA: Diagnosis not present

## 2019-08-10 DIAGNOSIS — K5900 Constipation, unspecified: Secondary | ICD-10-CM | POA: Diagnosis not present

## 2019-08-10 DIAGNOSIS — E1122 Type 2 diabetes mellitus with diabetic chronic kidney disease: Secondary | ICD-10-CM | POA: Diagnosis not present

## 2019-08-10 LAB — CBC WITH DIFFERENTIAL/PLATELET
Basophils Absolute: 0.1 10*3/uL (ref 0.0–0.1)
Basophils Relative: 1 % (ref 0.0–3.0)
Eosinophils Absolute: 0.1 10*3/uL (ref 0.0–0.7)
Eosinophils Relative: 0.8 % (ref 0.0–5.0)
HCT: 25.2 % — ABNORMAL LOW (ref 39.0–52.0)
Hemoglobin: 8.3 g/dL — ABNORMAL LOW (ref 13.0–17.0)
Lymphocytes Relative: 6.5 % — ABNORMAL LOW (ref 12.0–46.0)
Lymphs Abs: 0.6 10*3/uL — ABNORMAL LOW (ref 0.7–4.0)
MCHC: 33.1 g/dL (ref 30.0–36.0)
MCV: 79.4 fl (ref 78.0–100.0)
Monocytes Absolute: 0.7 10*3/uL (ref 0.1–1.0)
Monocytes Relative: 7.8 % (ref 3.0–12.0)
Neutro Abs: 7.7 10*3/uL (ref 1.4–7.7)
Neutrophils Relative %: 83.9 % — ABNORMAL HIGH (ref 43.0–77.0)
Platelets: 404 10*3/uL — ABNORMAL HIGH (ref 150.0–400.0)
RBC: 3.17 Mil/uL — ABNORMAL LOW (ref 4.22–5.81)
RDW: 17.5 % — ABNORMAL HIGH (ref 11.5–15.5)
WBC: 9.2 10*3/uL (ref 4.0–10.5)

## 2019-08-10 MED ORDER — SODIUM CHLORIDE 0.9 % IV SOLN
510.0000 mg | INTRAVENOUS | Status: DC
  Administered 2019-08-10: 510 mg via INTRAVENOUS
  Filled 2019-08-10: qty 17

## 2019-08-10 MED ORDER — METOPROLOL SUCCINATE ER 25 MG PO TB24: 25.0000 mg | ORAL_TABLET | Freq: Every day | ORAL | 1 refills | Status: DC

## 2019-08-10 NOTE — Discharge Instructions (Signed)

## 2019-08-10 NOTE — Progress Notes (Signed)
Virtual Visit via Video Note  I connected with Adam Reid on 08/10/19 at 10:18 AM by a video enabled telemedicine application doximity and verified that I am speaking with the correct person using two identifiers.   I discussed the limitations, risks, security and privacy concerns of performing an evaluation and management service by telephone and the availability of in person appointments. I also discussed with the patient that there may be a patient responsible charge related to this service. The patient expressed understanding and agreed to proceed, consent obtained - additional history from spouse, translating as well.   Chief complaint:  Chief Complaint  Patient presents with  . Follow-up    pt's wife states his blood sugar is higher fasting than after her eats. no changes to his diabetes.pts blood suars this morining was '180mg'$ /dl fasting.  pt is having a easier time urinating.     History of Present Illness: Adam Reid is a 74 y.o. male  Diabetes: Complicated by chronic kidney disease.  Last discussed by telemedicine January 15.  Elevated postprandials at that time, dietary recommendations including cutting back on use of honey recommended, added 2.5 mg of glipizide to regimen of '10mg'$  qd, continued Actos 30 mg daily.  Last A1c had improved from 7.8-7.3. No symptomatic lows.  Taking both 10 and 2.'5mg'$  glipizide Fasting: 182, average 170  2 hour postprandial: 120, 175, 217, 159, 82 one time.   BP Readings from Last 3 Encounters:  08/10/19 139/89  07/27/19 (!) 158/82  07/20/19 100/72   Variable readings. Borderline low of 100/72 on 1/8. Home readings 110-112/70-75 at that time. Decreased toprol to 1/2 of '25mg'$  qd. Home readings running higher. Lowest 135/75, usually 170-180/90's.    Lab Results  Component Value Date   HGBA1C 7.3 (A) 07/20/2019   HGBA1C 7.8 (A) 04/13/2019   HGBA1C 8.0 (A) 12/19/2018   Lab Results  Component Value Date   MICROALBUR 60.3 09/02/2015    LDLCALC 57 07/20/2019   CREATININE 2.47 (H) 07/20/2019   Anemia, iron deficient Discussed with his nephrologist on the phone January 21.  Usually has been in the mid 10 range, with 10.1 reading in October 2020.  Most recent hemoglobin had dropped to 8.9 with a low iron sat of 9%, total 28.  Plan on few doses of IV Feraheme, and referred to gastroenterology as overdue for colonoscopy. No black stools/dark stools.  appt with GI pending.  Has not had iron  - plans on infusion today.    Constipation See prior notes. miralax has been recommended, improving when discussed with nephrology.  Normal BM's now.   Patient Active Problem List   Diagnosis Date Noted  . Secondary hyperparathyroidism, renal ([facility]) 12/29/2016  . CAD (coronary artery disease), native coronary artery 08/02/2016  . Smoker 03/26/2016  . Hypotension 01/29/2016  . Abnormal myocardial perfusion study 01/29/2016  . Depression 12/09/2015  . Hyperlipidemia LDL goal <100 06/28/2014  . Diabetes mellitus with renal manifestations, controlled ([facility])   . HTN (hypertension) 06/24/2014  . 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 BPH (benign prostatic hyperplasia) 06/24/2014   Past Medical History:  Diagnosis Date  . Chronic kidney disease   . Diabetes mellitus without complication ([facility])   . Hyperlipidemia   . Hypertension   . Thyroid disease    was on supplement, taken off by [name]   Past Surgical History:  Procedure Laterality Date  . APPENDECTOMY    . CARDIAC CATHETERIZATION N/A  01/30/2016   Procedure: Left Heart Cath and Coronary Angiography;  Surgeon: [name], MD;  Location: [facility];  Service: Cardiovascular;  Laterality: N/A;   No Known Allergies Prior to Admission medications   Medication Sig Start Date End Date Taking? Authorizing Provider  aspirin EC 81 MG EC tablet Take 1 tablet (81 mg total) by mouth daily. Patient taking differently:  Take 81 mg by mouth every other day.  12/03/15  Yes Mayo, Pete Pelt, MD  atorvastatin (LIPITOR) 80 MG tablet TAKE 1 TABLET(80 MG) BY MOUTH DAILY 07/20/19  Yes [name], MD  blood glucose meter kit and supplies Use up to two times daily as directed  ICD10 E10.9 E11.9 02/20/19  Yes [name], MD  Blood Glucose Monitoring Suppl (ONETOUCH VERIO) w/Device KIT USE UTD 09/03/15  Yes [provider]  Cholecalciferol (VITAMIN D-3 PO) Take 1 tablet by mouth See admin instructions. Three times a week. No specific days   Yes [provider]  citalopram (CELEXA) 20 MG tablet TAKE 1 TABLET(20 MG) BY MOUTH DAILY 07/20/19  Yes [name], MD  cyanocobalamin 500 MCG tablet Take 500 mcg by mouth daily. Reported on 12/02/2015   Yes [provider]  glipiZIDE (GLUCOTROL XL) 10 MG 24 hr tablet TAKE 1 TABLET(10 MG) BY MOUTH DAILY WITH BREAKFAST 07/30/19  Yes [name], MD  GlucoCom Lancets MISC Use for home glucose monitoring 09/02/15  Yes Jeffery, Chelle, PA  glucose blood (ONETOUCH VERIO) test strip Test up to 2 times per day.  Uncontrolled diabetes with hyperglycemia and stage 4 CKD. 04/13/19  Yes [name], MD  metoprolol succinate (TOPROL-XL) 25 MG 24 hr tablet Take 0.5 tablets (12.5 mg total) by mouth daily. 07/20/19  Yes [name], MD  nitroGLYCERIN (NITROSTAT) 0.4 MG SL tablet Place 1 tablet (0.4 mg total) under the tongue every 5 (five) minutes x 3 doses as needed for chest pain. 01/31/16  Yes [name] E, NP  pioglitazone (ACTOS) 30 MG tablet TAKE 1 TABLET(30 MG) BY MOUTH DAILY 07/20/19  Yes [name], MD  polyethylene glycol (MIRALAX / GLYCOLAX) 17 g packet Take 17 g by mouth daily.   Yes [provider]  senna (SENOKOT) 8.6 MG tablet Take 1 tablet by mouth daily.   Yes [provider]  sitaGLIPtin (JANUVIA) 25 MG tablet Take 1 tablet (25 mg total) by mouth daily. 07/09/19  Yes [name], MD  tamsulosin (FLOMAX) 0.4  MG CAPS capsule TAKE 2 CAPSULES(0.8 MG) BY MOUTH DAILY 07/20/19  Yes [name], MD  albuterol (VENTOLIN HFA) 108 (90 Base) MCG/ACT inhaler Inhale 2 puffs into the lungs every 6 (six) hours as needed for wheezing or shortness of breath. Patient not taking: Reported on 07/27/2019 07/02/19   Maximiano Coss, NP  calcitRIOL (ROCALTROL) 0.25 MCG capsule  09/09/17   [provider]   Social History   Socioeconomic History  . Marital status: Married    Spouse name: [name]  . Number of children: 1  . Years of education: 12th grade  . Highest education level: Not on file  Occupational History  . Occupation: Retired-accountant  Tobacco Use  . Smoking status: Current Every Day Smoker    Packs/day: 0.25    Years: 48.00    Pack years: 12.00    Types: Cigarettes  . Smokeless tobacco: Never Used  . Tobacco comment: 09/07/16 3-4 cigs daily, trying to cut back  Substance and Sexual Activity  .  Alcohol use: No    Alcohol/week: 0.0 standard drinks  . Drug use: No  . Sexual activity: Never    Partners: Female  Other Topics Concern  . Not on file  Social History Narrative   Originally from [place]. Came to the [place] in 2009, following their son who came here for school.   Married.   Lives with his wife.   Their adult son lives in [place], [place], where he is in optometry school.   Education: [employer]   Exercise: No   Social Determinants of Radio broadcast assistant Strain:   . Difficulty of Paying Living Expenses: Not on file  Food Insecurity:   . Worried About Charity fundraiser in the Last Year: Not on file  . Ran Out of Food in the Last Year: Not on file  Transportation Needs:   . Lack of Transportation (Medical): Not on file  . Lack of Transportation (Non-Medical): Not on file  Physical Activity:   . Days of Exercise per Week: Not on file  . Minutes of Exercise per Session: Not on file  Stress:   . Feeling of Stress : Not on file  Social Connections:   . Frequency of  Communication with Friends and Family: Not on file  . Frequency of Social Gatherings with Friends and Family: Not on file  . Attends Religious Services: Not on file  . Active Member of Clubs or Organizations: Not on file  . Attends Archivist Meetings: Not on file  . Marital Status: Not on file  Intimate Partner Violence:   . Fear of Current or Ex-Partner: Not on file  . Emotionally Abused: Not on file  . Physically Abused: Not on file  . Sexually Abused: Not on file    Observations/Objective: Vitals:   08/10/19 0826  BP: 139/89  Weight: 143 lb (64.9 kg)  Height: '5\' 6"'$  (1.676 m)   Appropriate responses, nontoxic appearing, no distress.  All questions answered, understanding of plan expressed.  Assessment and Plan: Type 2 diabetes mellitus with chronic kidney disease, without long-term current use of insulin, unspecified CKD stage (HCC)  - variable readings, but A1c improved few weeks ago.  Continue same regimen for now.  Handout given on diabetes and nutrition as that may be responsible for some of the variability.  Constipation, unspecified constipation type  -Now resolved, option of MiraLAX if recurrence  Iron deficiency anemia, unspecified iron deficiency anemia type  -Iron infusion plan today, has gastroenterology follow-up planned to evaluate for blood loss, due for colonoscopy.  In office exam recommended in 2 weeks for repeat CBC.  Essential hypertension - Plan: metoprolol succinate (TOPROL-XL) 25 MG 24 hr tablet  -Increasing readings on lower dose of Toprol, restart full tablet at 25 mg daily, monitor home readings, hypotensive precautions/orthostatic precautions discussed   Follow Up Instructions: 2 weeks in office  Patient Instructions  No changes for diabetes for now. Last A1c was improved and I do not want it to drop too low.  See information on diet and diabetes below.   Keep follow up with gastroenterology for anemia/low iron. Follow up for repeat  blood count with myself or nephrology in next 2 weeks.   Increase metoprolol to full pill per day for blood pressure. Keep a record of your blood pressures outside of the office and bring them to the next office visit in 2 weeks.   Thank you for taking my call today.  Return to the clinic or go  to the nearest emergency room if any of your symptoms worsen or new symptoms occur.   Diabetes Mellitus and Nutrition, Adult When you have diabetes (diabetes mellitus), it is very important to have healthy eating habits because your blood sugar (glucose) levels are greatly affected by what you eat and drink. Eating healthy foods in the appropriate amounts, at about the same times every day, can help you:  Control your blood glucose.  Lower your risk of heart disease.  Improve your blood pressure.  Reach or maintain a healthy weight. Every person with diabetes is different, and each person has different needs for a meal plan. Your health care provider may recommend that you work with a diet and nutrition specialist (dietitian) to make a meal plan that is best for you. Your meal plan may vary depending on factors such as:  The calories you need.  The medicines you take.  Your weight.  Your blood glucose, blood pressure, and cholesterol levels.  Your activity level.  Other health conditions you have, such as heart or kidney disease. How do carbohydrates affect me? Carbohydrates, also called carbs, affect your blood glucose level more than any other type of food. Eating carbs naturally raises the amount of glucose in your blood. Carb counting is a method for keeping track of how many carbs you eat. Counting carbs is important to keep your blood glucose at a healthy level, especially if you use insulin or take certain oral diabetes medicines. It is important to know how many carbs you can safely have in each meal. This is different for every person. Your dietitian can help you calculate how many  carbs you should have at each meal and for each snack. Foods that contain carbs include:  Bread, cereal, rice, pasta, and crackers.  Potatoes and corn.  Peas, beans, and lentils.  Milk and yogurt.  Fruit and juice.  Desserts, such as cakes, cookies, ice cream, and candy. How does alcohol affect me? Alcohol can cause a sudden decrease in blood glucose (hypoglycemia), especially if you use insulin or take certain oral diabetes medicines. Hypoglycemia can be a life-threatening condition. Symptoms of hypoglycemia (sleepiness, dizziness, and confusion) are similar to symptoms of having too much alcohol. If your health care provider says that alcohol is safe for you, follow these guidelines:  Limit alcohol intake to no more than 1 drink per day for nonpregnant women and 2 drinks per day for men. One drink equals 12 oz of beer, 5 oz of wine, or 1 oz of hard liquor.  Do not drink on an empty stomach.  Keep yourself hydrated with water, diet soda, or unsweetened iced tea.  Keep in mind that regular soda, juice, and other mixers may contain a lot of sugar and must be counted as carbs. What are tips for following this plan?  Reading food labels  Start by checking the serving size on the "Nutrition Facts" label of packaged foods and drinks. The amount of calories, carbs, fats, and other nutrients listed on the label is based on one serving of the item. Many items contain more than one serving per package.  Check the total grams (g) of carbs in one serving. You can calculate the number of servings of carbs in one serving by dividing the total carbs by 15. For example, if a food has 30 g of total carbs, it would be equal to 2 servings of carbs.  Check the number of grams (g) of saturated and trans fats in one  serving. Choose foods that have low or no amount of these fats.  Check the number of milligrams (mg) of salt (sodium) in one serving. Most people should limit total sodium intake to less  than 2,300 mg per day.  Always check the nutrition information of foods labeled as "low-fat" or "nonfat". These foods may be higher in added sugar or refined carbs and should be avoided.  Talk to your dietitian to identify your daily goals for nutrients listed on the label. Shopping  Avoid buying canned, premade, or processed foods. These foods tend to be high in fat, sodium, and added sugar.  Shop around the outside edge of the grocery store. This includes fresh fruits and vegetables, bulk grains, fresh meats, and fresh dairy. Cooking  Use low-heat cooking methods, such as baking, instead of high-heat cooking methods like deep frying.  Cook using healthy oils, such as olive, canola, or sunflower oil.  Avoid cooking with butter, cream, or high-fat meats. Meal planning  Eat meals and snacks regularly, preferably at the same times every day. Avoid going long periods of time without eating.  Eat foods high in fiber, such as fresh fruits, vegetables, beans, and whole grains. Talk to your dietitian about how many servings of carbs you can eat at each meal.  Eat 4-6 ounces (oz) of lean protein each day, such as lean meat, chicken, fish, eggs, or tofu. One oz of lean protein is equal to: ? 1 oz of meat, chicken, or fish. ? 1 egg. ?  cup of tofu.  Eat some foods each day that contain healthy fats, such as avocado, nuts, seeds, and fish. Lifestyle  Check your blood glucose regularly.  Exercise regularly as told by your health care provider. This may include: ? 150 minutes of moderate-intensity or vigorous-intensity exercise each week. This could be brisk walking, biking, or water aerobics. ? Stretching and doing strength exercises, such as yoga or weightlifting, at least 2 times a week.  Take medicines as told by your health care provider.  Do not use any products that contain nicotine or tobacco, such as cigarettes and e-cigarettes. If you need help quitting, ask your health care  provider.  Work with a Social worker or diabetes educator to identify strategies to manage stress and any emotional and social challenges. Questions to ask a health care provider  Do I need to meet with a diabetes educator?  Do I need to meet with a dietitian?  What number can I call if I have questions?  When are the best times to check my blood glucose? Where to find more information:  American Diabetes Association: diabetes.org  Academy of Nutrition and Dietetics: www.eatright.[employer] of Diabetes and Digestive and Kidney Diseases (NIH): [employer] Summary  A healthy meal plan will help you control your blood glucose and maintain a healthy lifestyle.  Working with a diet and nutrition specialist (dietitian) can help you make a meal plan that is best for you.  Keep in mind that carbohydrates (carbs) and alcohol have immediate effects on your blood glucose levels. It is important to count carbs and to use alcohol carefully. This information is not intended to replace advice given to you by your health care provider. Make sure you discuss any questions you have with your health care provider. Document Revised: 06/10/2017 Document Reviewed: 08/02/2016 Elsevier Patient Education  2020 [employer].   Managing Your Hypertension Hypertension is commonly called high blood pressure. This is when the force of your blood pressing against  the walls of your arteries is too strong. Arteries are blood vessels that carry blood from your heart throughout your body. Hypertension forces the heart to work harder to pump blood, and may cause the arteries to become narrow or stiff. Having untreated or uncontrolled hypertension can cause heart attack, stroke, kidney disease, and other problems. What are blood pressure readings? A blood pressure reading consists of a higher number over a lower number. Ideally, your blood pressure should be below 120/80. The first ("top") number is  called the systolic pressure. It is a measure of the pressure in your arteries as your heart beats. The second ("bottom") number is called the diastolic pressure. It is a measure of the pressure in your arteries as the heart relaxes. What does my blood pressure reading mean? Blood pressure is classified into four stages. Based on your blood pressure reading, your health care provider may use the following stages to determine what type of treatment you need, if any. Systolic pressure and diastolic pressure are measured in a unit called mm Hg. Normal  Systolic pressure: below 379.  Diastolic pressure: below 80. Elevated  Systolic pressure: 024-097.  Diastolic pressure: below 80. Hypertension stage 1  Systolic pressure: 353-299.  Diastolic pressure: 24-26. Hypertension stage 2  Systolic pressure: 834 or above.  Diastolic pressure: 90 or above. What health risks are associated with hypertension? Managing your hypertension is an important responsibility. Uncontrolled hypertension can lead to:  A heart attack.  A stroke.  A weakened blood vessel (aneurysm).  Heart failure.  Kidney damage.  Eye damage.  Metabolic syndrome.  Memory and concentration problems. What changes can I make to manage my hypertension? Hypertension can be managed by making lifestyle changes and possibly by taking medicines. Your health care provider will help you make a plan to bring your blood pressure within a normal range. Eating and drinking   Eat a diet that is high in fiber and potassium, and low in salt (sodium), added sugar, and fat. An example eating plan is called the DASH (Dietary Approaches to Stop Hypertension) diet. To eat this way: ? Eat plenty of fresh fruits and vegetables. Try to fill half of your plate at each meal with fruits and vegetables. ? Eat whole grains, such as whole wheat pasta, brown rice, or whole grain bread. Fill about one quarter of your plate with whole grains. ? Eat  low-fat diary products. ? Avoid fatty cuts of meat, processed or cured meats, and poultry with skin. Fill about one quarter of your plate with lean proteins such as fish, chicken without skin, beans, eggs, and tofu. ? Avoid premade and processed foods. These tend to be higher in sodium, added sugar, and fat.  Reduce your daily sodium intake. Most people with hypertension should eat less than 1,500 mg of sodium a day.  Limit alcohol intake to no more than 1 drink a day for nonpregnant women and 2 drinks a day for men. One drink equals 12 oz of beer, 5 oz of wine, or 1 oz of hard liquor. Lifestyle  Work with your health care provider to maintain a healthy body weight, or to lose weight. Ask what an ideal weight is for you.  Get at least 30 minutes of exercise that causes your heart to beat faster (aerobic exercise) most days of the week. Activities may include walking, swimming, or biking.  Include exercise to strengthen your muscles (resistance exercise), such as weight lifting, as part of your weekly exercise routine. Try to  do these types of exercises for 30 minutes at least 3 days a week.  Do not use any products that contain nicotine or tobacco, such as cigarettes and e-cigarettes. If you need help quitting, ask your health care provider.  Control any long-term (chronic) conditions you have, such as high cholesterol or diabetes. Monitoring  Monitor your blood pressure at home as told by your health care provider. Your personal target blood pressure may vary depending on your medical conditions, your age, and other factors.  Have your blood pressure checked regularly, as often as told by your health care provider. Working with your health care provider  Review all the medicines you take with your health care provider because there may be side effects or interactions.  Talk with your health care provider about your diet, exercise habits, and other lifestyle factors that may be  contributing to hypertension.  Visit your health care provider regularly. Your health care provider can help you create and adjust your plan for managing hypertension. Will I need medicine to control my blood pressure? Your health care provider may prescribe medicine if lifestyle changes are not enough to get your blood pressure under control, and if:  Your systolic blood pressure is 130 or higher.  Your diastolic blood pressure is 80 or higher. Take medicines only as told by your health care provider. Follow the directions carefully. Blood pressure medicines must be taken as prescribed. The medicine does not work as well when you skip doses. Skipping doses also puts you at risk for problems. Contact a health care provider if:  You think you are having a reaction to medicines you have taken.  You have repeated (recurrent) headaches.  You feel dizzy.  You have swelling in your ankles.  You have trouble with your vision. Get help right away if:  You develop a severe headache or confusion.  You have unusual weakness or numbness, or you feel faint.  You have severe pain in your chest or abdomen.  You vomit repeatedly.  You have trouble breathing. Summary  Hypertension is when the force of blood pumping through your arteries is too strong. If this condition is not controlled, it may put you at risk for serious complications.  Your personal target blood pressure may vary depending on your medical conditions, your age, and other factors. For most people, a normal blood pressure is less than 120/80.  Hypertension is managed by lifestyle changes, medicines, or both. Lifestyle changes include weight loss, eating a healthy, low-sodium diet, exercising more, and limiting alcohol. This information is not intended to replace advice given to you by your health care provider. Make sure you discuss any questions you have with your health care provider. Document Revised: 10/20/2018 Document  Reviewed: 05/26/2016 Elsevier Patient Education  [place].      I discussed the assessment and treatment plan with the patient. The patient was provided an opportunity to ask questions and all were answered. The patient agreed with the plan and demonstrated an understanding of the instructions.   The patient was advised to call back or seek an in-person evaluation if the symptoms worsen or if the condition fails to improve as anticipated.  I provided 20 minutes of non-face-to-face time during this encounter.   [name], MD

## 2019-08-10 NOTE — Patient Instructions (Addendum)
No changes for diabetes for now. Last A1c was improved and I do not want it to drop too low.  See information on diet and diabetes below.   Keep follow up with gastroenterology for anemia/low iron. Follow up for repeat blood count with myself or nephrology in next 2 weeks.   Increase metoprolol to full pill per day for blood pressure. Keep a record of your blood pressures outside of the office and bring them to the next office visit in 2 weeks.   Thank you for taking my call today.  Return to the clinic or go to the nearest emergency room if any of your symptoms worsen or new symptoms occur.   Diabetes Mellitus and Nutrition, Adult When you have diabetes (diabetes mellitus), it is very important to have healthy eating habits because your blood sugar (glucose) levels are greatly affected by what you eat and drink. Eating healthy foods in the appropriate amounts, at about the same times every day, can help you:  Control your blood glucose.  Lower your risk of heart disease.  Improve your blood pressure.  Reach or maintain a healthy weight. Every person with diabetes is different, and each person has different needs for a meal plan. Your health care provider may recommend that you work with a diet and nutrition specialist (dietitian) to make a meal plan that is best for you. Your meal plan may vary depending on factors such as:  The calories you need.  The medicines you take.  Your weight.  Your blood glucose, blood pressure, and cholesterol levels.  Your activity level.  Other health conditions you have, such as heart or kidney disease. How do carbohydrates affect me? Carbohydrates, also called carbs, affect your blood glucose level more than any other type of food. Eating carbs naturally raises the amount of glucose in your blood. Carb counting is a method for keeping track of how many carbs you eat. Counting carbs is important to keep your blood glucose at a healthy level,  especially if you use insulin or take certain oral diabetes medicines. It is important to know how many carbs you can safely have in each meal. This is different for every person. Your dietitian can help you calculate how many carbs you should have at each meal and for each snack. Foods that contain carbs include:  Bread, cereal, rice, pasta, and crackers.  Potatoes and corn.  Peas, beans, and lentils.  Milk and yogurt.  Fruit and juice.  Desserts, such as cakes, cookies, ice cream, and candy. How does alcohol affect me? Alcohol can cause a sudden decrease in blood glucose (hypoglycemia), especially if you use insulin or take certain oral diabetes medicines. Hypoglycemia can be a life-threatening condition. Symptoms of hypoglycemia (sleepiness, dizziness, and confusion) are similar to symptoms of having too much alcohol. If your health care provider says that alcohol is safe for you, follow these guidelines:  Limit alcohol intake to no more than 1 drink per day for nonpregnant women and 2 drinks per day for men. One drink equals 12 oz of beer, 5 oz of wine, or 1 oz of hard liquor.  Do not drink on an empty stomach.  Keep yourself hydrated with water, diet soda, or unsweetened iced tea.  Keep in mind that regular soda, juice, and other mixers may contain a lot of sugar and must be counted as carbs. What are tips for following this plan?  Reading food labels  Start by checking the serving size on the "  Nutrition Facts" label of packaged foods and drinks. The amount of calories, carbs, fats, and other nutrients listed on the label is based on one serving of the item. Many items contain more than one serving per package.  Check the total grams (g) of carbs in one serving. You can calculate the number of servings of carbs in one serving by dividing the total carbs by 15. For example, if a food has 30 g of total carbs, it would be equal to 2 servings of carbs.  Check the number of grams  (g) of saturated and trans fats in one serving. Choose foods that have low or no amount of these fats.  Check the number of milligrams (mg) of salt (sodium) in one serving. Most people should limit total sodium intake to less than 2,300 mg per day.  Always check the nutrition information of foods labeled as "low-fat" or "nonfat". These foods may be higher in added sugar or refined carbs and should be avoided.  Talk to your dietitian to identify your daily goals for nutrients listed on the label. Shopping  Avoid buying canned, premade, or processed foods. These foods tend to be high in fat, sodium, and added sugar.  Shop around the outside edge of the grocery store. This includes fresh fruits and vegetables, bulk grains, fresh meats, and fresh dairy. Cooking  Use low-heat cooking methods, such as baking, instead of high-heat cooking methods like deep frying.  Cook using healthy oils, such as olive, canola, or sunflower oil.  Avoid cooking with butter, cream, or high-fat meats. Meal planning  Eat meals and snacks regularly, preferably at the same times every day. Avoid going long periods of time without eating.  Eat foods high in fiber, such as fresh fruits, vegetables, beans, and whole grains. Talk to your dietitian about how many servings of carbs you can eat at each meal.  Eat 4-6 ounces (oz) of lean protein each day, such as lean meat, chicken, fish, eggs, or tofu. One oz of lean protein is equal to: ? 1 oz of meat, chicken, or fish. ? 1 egg. ?  cup of tofu.  Eat some foods each day that contain healthy fats, such as avocado, nuts, seeds, and fish. Lifestyle  Check your blood glucose regularly.  Exercise regularly as told by your health care provider. This may include: ? 150 minutes of moderate-intensity or vigorous-intensity exercise each week. This could be brisk walking, biking, or water aerobics. ? Stretching and doing strength exercises, such as yoga or weightlifting, at  least 2 times a week.  Take medicines as told by your health care provider.  Do not use any products that contain nicotine or tobacco, such as cigarettes and e-cigarettes. If you need help quitting, ask your health care provider.  Work with a counselor or diabetes educator to identify strategies to manage stress and any emotional and social challenges. Questions to ask a health care provider  Do I need to meet with a diabetes educator?  Do I need to meet with a dietitian?  What number can I call if I have questions?  When are the best times to check my blood glucose? Where to find more information:  American Diabetes Association: diabetes.org  Academy of Nutrition and Dietetics: www.eatright.org  National Institute of Diabetes and Digestive and Kidney Diseases (NIH): www.niddk.nih.gov Summary  A healthy meal plan will help you control your blood glucose and maintain a healthy lifestyle.  Working with a diet and nutrition specialist (dietitian) can help   you make a meal plan that is best for you.  Keep in mind that carbohydrates (carbs) and alcohol have immediate effects on your blood glucose levels. It is important to count carbs and to use alcohol carefully. This information is not intended to replace advice given to you by your health care provider. Make sure you discuss any questions you have with your health care provider. Document Revised: 06/10/2017 Document Reviewed: 08/02/2016 Elsevier Patient Education  2020 [employer].   Managing Your Hypertension Hypertension is commonly called high blood pressure. This is when the force of your blood pressing against the walls of your arteries is too strong. Arteries are blood vessels that carry blood from your heart throughout your body. Hypertension forces the heart to work harder to pump blood, and may cause the arteries to become narrow or stiff. Having untreated or uncontrolled hypertension can cause heart attack, stroke, kidney  disease, and other problems. What are blood pressure readings? A blood pressure reading consists of a higher number over a lower number. Ideally, your blood pressure should be below 120/80. The first ("top") number is called the systolic pressure. It is a measure of the pressure in your arteries as your heart beats. The second ("bottom") number is called the diastolic pressure. It is a measure of the pressure in your arteries as the heart relaxes. What does my blood pressure reading mean? Blood pressure is classified into four stages. Based on your blood pressure reading, your health care provider may use the following stages to determine what type of treatment you need, if any. Systolic pressure and diastolic pressure are measured in a unit called mm Hg. Normal  Systolic pressure: below 283.  Diastolic pressure: below 80. Elevated  Systolic pressure: 151-761.  Diastolic pressure: below 80. Hypertension stage 1  Systolic pressure: 607-371.  Diastolic pressure: 06-26. Hypertension stage 2  Systolic pressure: 948 or above.  Diastolic pressure: 90 or above. What health risks are associated with hypertension? Managing your hypertension is an important responsibility. Uncontrolled hypertension can lead to:  A heart attack.  A stroke.  A weakened blood vessel (aneurysm).  Heart failure.  Kidney damage.  Eye damage.  Metabolic syndrome.  Memory and concentration problems. What changes can I make to manage my hypertension? Hypertension can be managed by making lifestyle changes and possibly by taking medicines. Your health care provider will help you make a plan to bring your blood pressure within a normal range. Eating and drinking   Eat a diet that is high in fiber and potassium, and low in salt (sodium), added sugar, and fat. An example eating plan is called the DASH (Dietary Approaches to Stop Hypertension) diet. To eat this way: ? Eat plenty of fresh fruits and  vegetables. Try to fill half of your plate at each meal with fruits and vegetables. ? Eat whole grains, such as whole wheat pasta, brown rice, or whole grain bread. Fill about one quarter of your plate with whole grains. ? Eat low-fat diary products. ? Avoid fatty cuts of meat, processed or cured meats, and poultry with skin. Fill about one quarter of your plate with lean proteins such as fish, chicken without skin, beans, eggs, and tofu. ? Avoid premade and processed foods. These tend to be higher in sodium, added sugar, and fat.  Reduce your daily sodium intake. Most people with hypertension should eat less than 1,500 mg of sodium a day.  Limit alcohol intake to no more than 1 drink a day for nonpregnant  women and 2 drinks a day for men. One drink equals 12 oz of beer, 5 oz of wine, or 1 oz of hard liquor. Lifestyle  Work with your health care provider to maintain a healthy body weight, or to lose weight. Ask what an ideal weight is for you.  Get at least 30 minutes of exercise that causes your heart to beat faster (aerobic exercise) most days of the week. Activities may include walking, swimming, or biking.  Include exercise to strengthen your muscles (resistance exercise), such as weight lifting, as part of your weekly exercise routine. Try to do these types of exercises for 30 minutes at least 3 days a week.  Do not use any products that contain nicotine or tobacco, such as cigarettes and e-cigarettes. If you need help quitting, ask your health care provider.  Control any long-term (chronic) conditions you have, such as high cholesterol or diabetes. Monitoring  Monitor your blood pressure at home as told by your health care provider. Your personal target blood pressure may vary depending on your medical conditions, your age, and other factors.  Have your blood pressure checked regularly, as often as told by your health care provider. Working with your health care provider  Review all  the medicines you take with your health care provider because there may be side effects or interactions.  Talk with your health care provider about your diet, exercise habits, and other lifestyle factors that may be contributing to hypertension.  Visit your health care provider regularly. Your health care provider can help you create and adjust your plan for managing hypertension. Will I need medicine to control my blood pressure? Your health care provider may prescribe medicine if lifestyle changes are not enough to get your blood pressure under control, and if:  Your systolic blood pressure is 130 or higher.  Your diastolic blood pressure is 80 or higher. Take medicines only as told by your health care provider. Follow the directions carefully. Blood pressure medicines must be taken as prescribed. The medicine does not work as well when you skip doses. Skipping doses also puts you at risk for problems. Contact a health care provider if:  You think you are having a reaction to medicines you have taken.  You have repeated (recurrent) headaches.  You feel dizzy.  You have swelling in your ankles.  You have trouble with your vision. Get help right away if:  You develop a severe headache or confusion.  You have unusual weakness or numbness, or you feel faint.  You have severe pain in your chest or abdomen.  You vomit repeatedly.  You have trouble breathing. Summary  Hypertension is when the force of blood pumping through your arteries is too strong. If this condition is not controlled, it may put you at risk for serious complications.  Your personal target blood pressure may vary depending on your medical conditions, your age, and other factors. For most people, a normal blood pressure is less than 120/80.  Hypertension is managed by lifestyle changes, medicines, or both. Lifestyle changes include weight loss, eating a healthy, low-sodium diet, exercising more, and limiting  alcohol. This information is not intended to replace advice given to you by your health care provider. Make sure you discuss any questions you have with your health care provider. Document Revised: 10/20/2018 Document Reviewed: 05/26/2016 Elsevier Patient Education  [employer].

## 2019-08-10 NOTE — Patient Instructions (Addendum)
If you are age 73 or older, your body mass index should be between 23-30. Your Body mass index is 23.24 kg/m. If this is out of the aforementioned range listed, please consider follow up with your Primary Care Provider.  If you are age 29 or younger, your body mass index should be between 19-25. Your Body mass index is 23.24 kg/m. If this is out of the aformentioned range listed, please consider follow up with your Primary Care Provider.    Your provider has requested that you go to the basement level for lab work before leaving today. Press "B" on the elevator. The lab is located at the first door on the left as you exit the elevator.  Please see a cardiologist regarding medical clearance to do a Colonoscopy and endoscopy with Dr Silverio Decamp.  Please go straight to the Emergency Room of you develop Chest pain, Shortness of Breath or profound fatigue

## 2019-08-10 NOTE — Progress Notes (Addendum)
08/10/2019 Adam Reid [id number] 09-10-1945  Chief Complaint:   HISTORY OF PRESENT ILLNESS: Adam Reid is a 74 year old [place] male with a past medical history hypertension, coronary artery disease, diabetes mellitus type 2, chronic kidney disease stage IV, iron deficiency anemia which required Feraheme infusions in 2019, hyperparathyroidism, hypercholesterolemia, gout, BPH, and colon polyps.  COVID-19 positive mid December with mild cough symptoms and decreased appetite which has resolved.  He presents accompanied by his wife who is facilitating communication as he does not speak [place].  He is followed by nephrologist, Dr. Jamal Maes.  He underwent laboratory studies as ordered by Dr. [name] on 07/31/2019 which identified worsening iron deficiency anemia.  Iron 28.  Iron saturation 9%.  TIBC 304.  WBC 9.9.  RBC 3.5.  Hemoglobin 8.9.  (down from 10.3 on 03/13/2019). Hematocrit 27.9.  MCV 80.  Platelet 440.  Glucose 37.(If repeated glucose level at home which was 200).  BUN 57.  Creatinine 2.60.  Sodium 139.  Potassium 4.2.  Calcium 10.  Phosphorus 5.4.  Albumin 4.1.  PTH 35.  He is scheduled to receive a Feraheme infusion later today.  He was advised by Dr. [name] to undergo a GI evaluation for his IDA.  He denies having any dysphagia or heartburn.  No upper or lower abdominal pain.  He has occasional burping.  Takes aspirin 81 mg daily.  No other NSAIDs.  He typically passes a normal formed brown bowel movement daily.  However, he developed constipation approximately 6 months ago and went 5 to 7 days without a bowel movement.  He started taking MiraLAX and senna as needed and his constipation resolved 1-1/2 weeks ago.  He is passing a normal formed bowel movement daily at this time.  No rectal bleeding or black stools. He underwent a colonoscopy by Dr.Nandigam 07/22/2015 which identified 2 tubular adenomatous polyps and internal hemorrhoids.  No family history of gastric or colorectal  cancer.  He reports losing 10 pounds over the past 3 months.  He attributes some of his weight loss due to a decreased appetite for 2 weeks while he had Covid.  Smokes cigarettes for the past 50 years and quit smoking 1 month ago.  No alcohol or drug use.   Colonoscopy 07/22/2015 by Dr. Silverio Decamp: 1. Semi-pedunculated polyp ranging from 12 to 67m in size was found in the sigmoid colon; polypectomies were performed using snare cautery 2. 5-6 mm polyp in the ascending colon removed by cold snare, 2-3 mm polyp in the ascending colon removed by biopsy forceps 3. Moderate sized internal hemorrhoids  Coronary angiogram 01/29/2016: Ramus lesion 20% stenosis, 50% stenosis ostial second diagonal, 20% mid LAD, normal LVEDP  ADDENDUM: Cardiac history documents in epic further reviewed after the patient's office visit 08/10/2019. Patient was previously evaluated by cardiologist, Dr. STollie Ethand Dr. KBronson Ing admitted to the hospital after experiencing hypotension following an abnormal stress test. Previously on He underwent a cardiac cath, results below. He was prescribed Plavix and ASA.  Left Heart Cath and Coronary Angiography 01/30/16:  Ramus lesion, 20% stenosed.  Ost 2nd Diag to 2nd Diag lesion, 50% stenosed.  Mid LAD lesion, 20% stenosed.  Normal LVEDP.  Transthoracic Echocardiography 01/30/16: - Left ventricle: The cavity size was normal. There was moderate  concentric hypertrophy. Systolic function was mildly to  moderately reduced. The estimated ejection fraction was in the  range of 40% to 45%. Severe hypokinesis of the basal-midinferior  myocardium. Hypokinesis of the lateral myocardium. Doppler  parameters are consistent with abnormal left ventricular  relaxation (grade 1 diastolic dysfunction). - Mitral valve: There was moderate regurgitation. - Right atrium: The atrium was mildly dilated. - Pulmonary arteries: Systolic pressure was mildly increased. PA  peak pressure:  32 mm Hg (S).   Past Medical History:  Diagnosis Date  . Chronic kidney disease   . Diabetes mellitus without complication ([place])   . Hyperlipidemia   . Hypertension   . Thyroid disease    was on supplement, taken off by [name]   Past Surgical History:  Procedure Laterality Date  . APPENDECTOMY    . CARDIAC CATHETERIZATION N/A 01/30/2016   Procedure: Left Heart Cath and Coronary Angiography;  Surgeon: [name], MD;  Location: [facility];  Service: Cardiovascular;  Laterality: N/A;    reports that he has been smoking cigarettes. He has a 12.00 pack-year smoking history. He has never used smokeless tobacco. He reports that he does not drink alcohol or use drugs. family history includes Heart disease (age of onset: 74) in his brother; Hyperlipidemia in his mother; Hypertension in his mother and sister; Kidney disease in his father. No Known Allergies    Outpatient Encounter Medications as of 08/10/2019  Medication Sig  . aspirin EC 81 MG EC tablet Take 1 tablet (81 mg total) by mouth daily. (Patient taking differently: Take 81 mg by mouth every other day. )  . atorvastatin (LIPITOR) 80 MG tablet TAKE 1 TABLET(80 MG) BY MOUTH DAILY  . blood glucose meter kit and supplies Use up to two times daily as directed  ICD10 E10.9 E11.9  . Blood Glucose Monitoring Suppl (ONETOUCH VERIO) w/Device KIT USE UTD  . calcitRIOL (ROCALTROL) 0.25 MCG capsule daily.   . Cholecalciferol (VITAMIN D-3 PO) Take 1 tablet by mouth See admin instructions. Three times a week. No specific days  . citalopram (CELEXA) 20 MG tablet TAKE 1 TABLET(20 MG) BY MOUTH DAILY  . cyanocobalamin 500 MCG tablet Take 500 mcg by mouth daily. Reported on 12/02/2015  . glipiZIDE (GLUCOTROL XL) 10 MG 24 hr tablet TAKE 1 TABLET(10 MG) BY MOUTH DAILY WITH BREAKFAST  . GlucoCom Lancets MISC Use for home glucose monitoring  . glucose blood (ONETOUCH VERIO) test strip Test up to 2 times per day.  Uncontrolled diabetes  with hyperglycemia and stage 4 CKD.  . metoprolol succinate (TOPROL-XL) 25 MG 24 hr tablet Take 1 tablet (25 mg total) by mouth daily.  . nitroGLYCERIN (NITROSTAT) 0.4 MG SL tablet Place 1 tablet (0.4 mg total) under the tongue every 5 (five) minutes x 3 doses as needed for chest pain.  . pioglitazone (ACTOS) 30 MG tablet TAKE 1 TABLET(30 MG) BY MOUTH DAILY  . polyethylene glycol (MIRALAX / GLYCOLAX) 17 g packet Take 17 g by mouth daily.  [name] senna (SENOKOT) 8.6 MG tablet Take 1 tablet by mouth daily.  . sitaGLIPtin (JANUVIA) 25 MG tablet Take 1 tablet (25 mg total) by mouth daily.  . tamsulosin (FLOMAX) 0.4 MG CAPS capsule TAKE 2 CAPSULES(0.8 MG) BY MOUTH DAILY  . albuterol (VENTOLIN HFA) 108 (90 Base) MCG/ACT inhaler Inhale 2 puffs into the lungs every 6 (six) hours as needed for wheezing or shortness of breath. (Patient not taking: Reported on 07/27/2019)   No facility-administered encounter medications on file as of 08/10/2019.     REVIEW OF SYSTEMS  : All other systems reviewed and negative except where noted in the History of Present Illness.   PHYSICAL EXAM: BP 118/64  Pulse 96   Temp 98.1 F (36.7 C)   Ht '5\' 6"'$  (1.676 m)   Wt 144 lb (65.3 kg)   BMI 23.24 kg/m  General: Well developed 74 year old male in no acute distress. Head: Normocephalic and atraumatic. Eyes:  Sclerae non-icteric, conjunctive pink. Ears: Normal auditory acuity. Mouth: Missing dentition, no ulcers or lesions.  Neck: Supple, no lymphadenopathy or thyromegaly.  Lungs: Clear bilaterally to auscultation without wheezes, crackles or rhonchi. Heart: Irregular rhythm, rate controlled, III/VI pansystolic murmur. Abdomen: Soft, nontender, non distended. No masses. No hepatosplenomegaly. Normoactive bowel sounds x 4 quadrants.  Rectal: Deferred.  Musculoskeletal: Symmetrical with no gross deformities. Skin: Warm and dry. No rash or lesions on visible extremities. Extremities: No edema. Neurological: Alert  oriented x 4, no focal deficits.  Psychological:  Alert and cooperative. Normal mood and affect.  ASSESSMENT AND PLAN:  13.  74 year old male with chronic kidney disease presents with acute on chronic iron deficiency anemia.  No evidence of active GI bleeding. Patient to receive IV Feraheme as ordered by his nephrologist later today. -Repeat CBC -EGD and colonoscopy be scheduled as soon as possible, however, the patient had a new heart murmur on exam today which may be more pronounced due to his anemia. His heart rhythm was slightly irregular as well. He denies any knowledge of having a heart murmur. I have ordered a repeat CBC now to make sure his hemoglobin has not significantly dropped.  -Heme slides -Further follow-up to be determined after EGD and colonoscopy completed  2.  History of CAD.  Echo 2017 LVEF 45 - 50%.  He is no longer followed by cardiology.  On exam today, he had a significant 3/6 pansystolic murmur and his heart rhythm was slightly irregular, rate controlled and stable blood pressure 118/64.  He is asymptomatic. -Cardiac evaluation ASAP, consider repeat ECHO -See plan in #1 -Patient to go to the local emergency room if he develops chest pain, shortness of breath, dizziness or profound fatigue  3.  Diabetes mellitus type 2   CC:  [name], MD

## 2019-08-13 ENCOUNTER — Telehealth: Payer: Self-pay | Admitting: Nurse Practitioner

## 2019-08-13 ENCOUNTER — Telehealth: Payer: Self-pay | Admitting: Family Medicine

## 2019-08-13 DIAGNOSIS — I2510 Atherosclerotic heart disease of native coronary artery without angina pectoris: Secondary | ICD-10-CM

## 2019-08-13 DIAGNOSIS — R011 Cardiac murmur, unspecified: Secondary | ICD-10-CM

## 2019-08-13 NOTE — Telephone Encounter (Signed)
REFERRAL REQUEST Telephone Note  What type of referral do you need? cardiologist  Have you been seen at our office for this problem? yes (Advise that they will likely need an appointment with their PCP before a referral can be done)  Is there a particular doctor or location that you prefer?   [facility]     Patient notified that referrals can take up to a week or longer to process. If they haven't heard anything within a week they should call back and speak with the referral department.

## 2019-08-13 NOTE — Telephone Encounter (Signed)
I called the patient, I left a detailed msg for his wife to call me back to verify if she was able to make arrangements for a cardiac evaluation/cardiac clearance prior to EGD/colonoscopy for the patient. Refer to office visit 07/31/2019. Pt does not  Speak English, his wife translates and facilitates his medical appointments.  Patient last saw cardiologist, Dr. Landry Corporal in 2017. Patient had a significant heart murmur on exam on 1/29. Past Echo showed MR. EF 45-50%. 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 Repeat Hg 8.3 down from 8.9 (Hg 10.3 on 03/13/2019).  Await return call from wife.

## 2019-08-13 NOTE — Telephone Encounter (Signed)
Please advise if it ok to refer to cardio

## 2019-08-14 NOTE — Telephone Encounter (Signed)
Patient's wife has been informed an was informed of Dr Vonna Kotyk below [employer]

## 2019-08-14 NOTE — Telephone Encounter (Signed)
Yes -gastroenterology note reviewed with more prominent heart murmur -  prior CAD, no recent cardiology eval. Urgent referral placed.    I agree with recommendations from gastroenterology (if patient is symptomatic with chest pain, shortness of breath, dizziness or increasing fatigue should be seen in the ER).  Please advise patient/spouse of this plan.  Let me know if there are questions.

## 2019-08-16 ENCOUNTER — Encounter: Payer: Self-pay | Admitting: Internal Medicine

## 2019-08-16 ENCOUNTER — Other Ambulatory Visit: Payer: Self-pay

## 2019-08-16 ENCOUNTER — Ambulatory Visit (INDEPENDENT_AMBULATORY_CARE_PROVIDER_SITE_OTHER): Payer: Medicare Other | Admitting: Internal Medicine

## 2019-08-16 VITALS — BP 132/70 | HR 92 | Temp 98.2°F | Ht 66.0 in | Wt 142.2 lb

## 2019-08-16 DIAGNOSIS — E785 Hyperlipidemia, unspecified: Secondary | ICD-10-CM

## 2019-08-16 DIAGNOSIS — R0609 Other forms of dyspnea: Secondary | ICD-10-CM

## 2019-08-16 DIAGNOSIS — I10 Essential (primary) hypertension: Secondary | ICD-10-CM

## 2019-08-16 DIAGNOSIS — I499 Cardiac arrhythmia, unspecified: Secondary | ICD-10-CM | POA: Diagnosis not present

## 2019-08-16 DIAGNOSIS — D649 Anemia, unspecified: Secondary | ICD-10-CM

## 2019-08-16 DIAGNOSIS — R0600 Dyspnea, unspecified: Secondary | ICD-10-CM | POA: Diagnosis not present

## 2019-08-16 NOTE — Progress Notes (Signed)
Cardiology Office Note:    Date:  08/16/2019   ID:  Adam Reid, DOB [birth year], MRN [id number]  PCP:  [name], MD  Cardiologist:  Elouise Munroe, MD  Electrophysiologist:  None   Referring MD: [name], MD   Chief Complaint: irregular heart beats detected on physical exam, SOB  History of Present Illness:    Adam Reid is a 74 y.o. 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  Patient is seen today with his wife present who serves as his interpreter.  He presents today at the recommendation of GI for detection of irregular heart rhythm on physical exam.  He was being evaluated by GI for unexplained anemia and planning for endoscopy to evaluate.  He is currently receiving IV iron for his anemia.  He has no known history of atrial fibrillation or significant rhythm disturbance.  Patient's wife endorses that he has become more short of breath, without chest pain.  He endorses only occasional palpitations.  She notes that he feels his breathing has been fast and mildly labored.  She has been using steam in his room, and he and his wife both agree that this has improved his shortness of breath.  They asked why he is so short of breath, and we do not have a clear source currently.  He has some dyspnea on exertion, but can walk for approximately 30 minutes prior to having to stop for shortness of breath.  He denies significant leg swelling.  He will be somewhat short of breath when he goes to sleep at night, however again the steam has helped.  No definite apnea or regular snoring.  He had COVID-19 infection in December, with coughing and fatigue and since that time has had a poor appetite.  He has had some associated weight loss.   Past Medical History:  Diagnosis Date  . Chronic kidney  disease   . Diabetes mellitus without complication ([place])   . Hyperlipidemia   . Hypertension   . Thyroid disease    was on supplement, taken off by [name]    Past Surgical History:  Procedure Laterality Date  . APPENDECTOMY    . CARDIAC CATHETERIZATION N/A 01/30/2016   Procedure: Left Heart Cath and Coronary Angiography;  Surgeon: [name], MD;  Location: [facility];  Service: Cardiovascular;  Laterality: N/A;    Current Medications: Current Meds  Medication Sig  . albuterol (VENTOLIN HFA) 108 (90 Base) MCG/ACT inhaler Inhale 2 puffs into the lungs every 6 (six) hours as needed for wheezing or shortness of breath.  [name] aspirin EC 81 MG EC tablet Take 1 tablet (81 mg total) by mouth daily. (Patient taking differently: Take 81 mg by mouth every other day. )  . atorvastatin (LIPITOR) 80 MG tablet TAKE 1 TABLET(80 MG) BY MOUTH DAILY  . blood glucose meter kit and supplies Use up to two times daily as directed  ICD10 E10.9 E11.9  . Blood Glucose Monitoring Suppl (ONETOUCH VERIO) w/Device KIT USE UTD  . calcitRIOL (ROCALTROL) 0.25 MCG capsule daily.   . Cholecalciferol (VITAMIN D-3 PO) Take 1 tablet by mouth See admin instructions. Three times a week. No specific days  . citalopram (CELEXA) 20 MG tablet TAKE 1 TABLET(20 MG) BY MOUTH DAILY  . cyanocobalamin 500 MCG tablet Take 500 mcg by mouth daily. Reported on 12/02/2015  .  glipiZIDE (GLUCOTROL XL) 10 MG 24 hr tablet TAKE 1 TABLET(10 MG) BY MOUTH DAILY WITH BREAKFAST  . GlucoCom Lancets MISC Use for home glucose monitoring  . glucose blood (ONETOUCH VERIO) test strip Test up to 2 times per day.  Uncontrolled diabetes with hyperglycemia and stage 4 CKD.  . metoprolol succinate (TOPROL-XL) 25 MG 24 hr tablet Take 1 tablet (25 mg total) by mouth daily.  . nitroGLYCERIN (NITROSTAT) 0.4 MG SL tablet Place 1 tablet (0.4 mg total) under the tongue every 5 (five) minutes x 3 doses as needed for chest pain.  . pioglitazone (ACTOS)  30 MG tablet TAKE 1 TABLET(30 MG) BY MOUTH DAILY  . polyethylene glycol (MIRALAX / GLYCOLAX) 17 g packet Take 17 g by mouth daily.  [name] senna (SENOKOT) 8.6 MG tablet Take 1 tablet by mouth daily.  . sitaGLIPtin (JANUVIA) 25 MG tablet Take 1 tablet (25 mg total) by mouth daily.  . tamsulosin (FLOMAX) 0.4 MG CAPS capsule TAKE 2 CAPSULES(0.8 MG) BY MOUTH DAILY     Allergies:   Patient has no known allergies.   Social History   Socioeconomic History  . Marital status: Married    Spouse name: [name]  . Number of children: 1  . Years of education: 12th grade  . Highest education level: Not on file  Occupational History  . Occupation: Retired-accountant  Tobacco Use  . Smoking status: Current Every Day Smoker    Packs/day: 0.25    Years: 48.00    Pack years: 12.00    Types: Cigarettes  . Smokeless tobacco: Never Used  . Tobacco comment: 09/07/16 3-4 cigs daily, trying to cut back  Substance and Sexual Activity  . Alcohol use: No    Alcohol/week: 0.0 standard drinks  . Drug use: No  . Sexual activity: Never    Partners: Female  Other Topics Concern  . Not on file  Social History Narrative   Originally from [place]. Came to the [place] in 2009, following their son who came here for school.   Married.   Lives with his wife.   Their adult son lives in [place], [place], where he is in optometry school.   Education: [employer]   Exercise: No   Social Determinants of Radio broadcast assistant Strain:   . Difficulty of Paying Living Expenses: Not on file  Food Insecurity:   . Worried About Charity fundraiser in the Last Year: Not on file  . Ran Out of Food in the Last Year: Not on file  Transportation Needs:   . Lack of Transportation (Medical): Not on file  . Lack of Transportation (Non-Medical): Not on file  Physical Activity:   . Days of Exercise per Week: Not on file  . Minutes of Exercise per Session: Not on file  Stress:   . Feeling of Stress : Not on file  Social Connections:     . Frequency of Communication with Friends and Family: Not on file  . Frequency of Social Gatherings with Friends and Family: Not on file  . Attends Religious Services: Not on file  . Active Member of Clubs or Organizations: Not on file  . Attends Archivist Meetings: Not on file  . Marital Status: Not on file     Family History: The patient's family history includes Heart disease (age of onset: 60) in his brother; Hyperlipidemia in his mother; Hypertension in his mother and sister; Kidney disease in his father. There is no history of Colon  cancer.  ROS:   Please see the history of present illness.    All other systems reviewed and are negative.  EKGs/Labs/Other Studies Reviewed:    The following studies were reviewed today:  EKG:  Sinus rhythm, PACs, prolonged QT with corrected QTC of 490 ms  Recent Labs: 07/20/2019: ALT 13 08/16/2019: BNP 734.2; BUN 50; Creatinine, Ser 2.18; Hemoglobin 8.4; Platelets 405; Potassium 4.6; Sodium 141  Recent Lipid Panel    Component Value Date/Time   CHOL 159 07/20/2019 1406   TRIG 127 07/20/2019 1406   HDL 80 07/20/2019 1406   CHOLHDL 2.0 07/20/2019 1406   CHOLHDL 2.1 03/26/2016 1839   VLDL 28 03/26/2016 1839   LDLCALC 57 07/20/2019 1406    Physical Exam:    VS:  BP 132/70   Pulse 92   Temp 98.2 F (36.8 C)   Ht '5\' 6"'$  (1.676 m)   Wt 142 lb 3.2 oz (64.5 kg)   SpO2 95%   BMI 22.95 kg/m     Wt Readings from Last 5 Encounters:  08/17/19 144 lb (65.3 kg)  08/16/19 142 lb 3.2 oz (64.5 kg)  08/10/19 144 lb (65.3 kg)  08/10/19 144 lb (65.3 kg)  08/10/19 143 lb (64.9 kg)     Constitutional: No acute distress, thin, well groomed Eyes: sclera non-icteric, normal conjunctiva and lids ENMT: normal dentition, moist mucous membranes Cardiovascular: Irregular rhythm, normal rate, no murmurs. S1 and S2 normal. Radial pulses normal bilaterally. No jugular venous distention.  Respiratory: clear to auscultation bilaterally GI : normal  bowel sounds, soft and nontender. No distention.   MSK: extremities warm, well perfused. No edema.  NEURO: grossly nonfocal exam, moves all extremities. PSYCH: alert and oriented x 3, normal mood and affect.   ASSESSMENT:    1. DOE (dyspnea on exertion)   2. Irregular heart rhythm   3. Essential hypertension   4. Hyperlipidemia, unspecified hyperlipidemia type   5. Anemia, unspecified type    PLAN:    Shortness of breath and dyspnea on exertion-this is likely multifactorial given recent COVID-19 infection, unexplained anemia with a hemoglobin around 8.3, and deconditioning due to reduced activity level.  With the recent COVID-19 infection, as well as irregular heartbeats, we should obtain an echocardiogram to ensure that his dyspnea is not from systolic or significant diastolic dysfunction.  We will obtain a BNP and a BMP today.  Irregular heart rhythm-this most likely represents PACs.  He had frequent irregular beats on physical exam today, and his ECG obtained 3 times demonstrates PACs on each.  While there is a possibility of atrial fibrillation, his irregular beats are most likely explained by PACs.  He remains on metoprolol succinate 25 mg daily.  We will obtain a BMP to evaluate creatinine and potassium in the setting of irregular heart rhythm.  Hypertension-therapy includes metoprolol succinate 25 mg daily.  He is also on twice daily tamsulosin for prostate indication.  No need for change in therapy at this time, we will await the results of echocardiogram.  Hyperlipidemia-LDL at goal, patient continues on atorvastatin 80 mg daily.  Anemia-I believe it is quite important to the patient complete his GI work-up.  We will obtain an echocardiogram to restratify him for this procedure and to better understand if there is a component of his irregular heart rhythm that would pose in any procedural concern.  However again I am suspicious that his irregular heart rhythm is likely frequent PACs.   If his echocardiogram is largely unremarkable, it  would be best that he continue his work-up for unexplained anemia with his plan for endoscopy.  We will repeat a CBC today.  Total time of encounter: 45 minutes total time of encounter, including 30 minutes spent in face-to-face patient care. This time includes coordination of care and counseling regarding above. Remainder of non-face-to-face time involved reviewing chart documents/testing relevant to the patient encounter and documentation in the medical record.  Cherlynn Kaiser, MD [facility]   Medication Adjustments/Labs and Tests Ordered: Current medicines are reviewed at length with the patient today.  Concerns regarding medicines are outlined above.  Orders Placed This Encounter  Procedures  . Basic metabolic panel  . Brain natriuretic peptide  . CBC  . EKG 12-Lead  . ECHOCARDIOGRAM COMPLETE   No orders of the defined types were placed in this encounter.   Patient Instructions  Medication Instructions:  NO CHANGES  Lab Work: BNP BMP CBC If you have labs (blood work) drawn today and your tests are completely normal, you will receive your results only by: [name] MyChart Message (if you have MyChart) OR . A paper copy in the mail If you have any lab test that is abnormal or we need to change your treatment, we will call you to review the results.  Testing/Procedures: Will be schedule at [facility] has requested that you have an echocardiogram. Echocardiography is a painless test that uses sound waves to create images of your heart. It provides your doctor with information about the size and shape of your heart and how well your heart's chambers and valves are working. This procedure takes approximately one hour. There are no restrictions for this procedure.   Follow-Up: At [facility], you and your health needs are our priority.  As part of our continuing mission to  provide you with exceptional heart care, we have created designated Provider Care Teams.  These Care Teams include your primary Cardiologist (physician) and Advanced Practice Providers (APPs -  Physician Assistants and Nurse Practitioners) who all work together to provide you with the care you need, when you need it.  Your next appointment:   1 to 2 week(s)  The format for your next appointment:   Virtual Visit   Provider:   Cherlynn Kaiser, MD  Other Instructions n/a

## 2019-08-16 NOTE — Patient Instructions (Signed)
Medication Instructions:  NO CHANGES  Lab Work: BNP BMP CBC If you have labs (blood work) drawn today and your tests are completely normal, you will receive your results only by: [name] MyChart Message (if you have MyChart) OR . A paper copy in the mail If you have any lab test that is abnormal or we need to change your treatment, we will call you to review the results.  Testing/Procedures: Will be schedule at [place] has requested that you have an echocardiogram. Echocardiography is a painless test that uses sound waves to create images of your heart. It provides your doctor with information about the size and shape of your heart and how well your heart's chambers and valves are working. This procedure takes approximately one hour. There are no restrictions for this procedure.   Follow-Up: At [facility], you and your health needs are our priority.  As part of our continuing mission to provide you with exceptional heart care, we have created designated Provider Care Teams.  These Care Teams include your primary Cardiologist (physician) and Advanced Practice Providers (APPs -  Physician Assistants and Nurse Practitioners) who all work together to provide you with the care you need, when you need it.  Your next appointment:   1 to 2 week(s)  The format for your next appointment:   Virtual Visit   Provider:   Cherlynn Kaiser, MD  Other Instructions n/a

## 2019-08-17 ENCOUNTER — Ambulatory Visit (HOSPITAL_BASED_OUTPATIENT_CLINIC_OR_DEPARTMENT_OTHER)
Admission: RE | Admit: 2019-08-17 | Discharge: 2019-08-17 | Disposition: A | Discharge status: Home / Self Care | Payer: Medicare Other | Source: Ambulatory Visit | Attending: Internal Medicine | Admitting: Internal Medicine

## 2019-08-17 ENCOUNTER — Ambulatory Visit (HOSPITAL_COMMUNITY)
Admission: RE | Admit: 2019-08-17 | Discharge: 2019-08-17 | Disposition: A | Discharge status: Home / Self Care | Payer: Medicare Other | Source: Ambulatory Visit | Attending: Nephrology | Admitting: Nephrology

## 2019-08-17 DIAGNOSIS — I5022 Chronic systolic (congestive) heart failure: Secondary | ICD-10-CM | POA: Diagnosis present

## 2019-08-17 DIAGNOSIS — R0600 Dyspnea, unspecified: Secondary | ICD-10-CM | POA: Diagnosis not present

## 2019-08-17 DIAGNOSIS — R0609 Other forms of dyspnea: Secondary | ICD-10-CM

## 2019-08-17 LAB — CBC
Hematocrit: 26.9 % — ABNORMAL LOW (ref 37.5–51.0)
Hemoglobin: 8.4 g/dL — ABNORMAL LOW (ref 13.0–17.7)
MCH: 25.5 pg — ABNORMAL LOW (ref 26.6–33.0)
MCHC: 31.2 g/dL — ABNORMAL LOW (ref 31.5–35.7)
MCV: 82 fL (ref 79–97)
Platelets: 405 10*3/uL (ref 150–450)
RBC: 3.3 x10E6/uL — ABNORMAL LOW (ref 4.14–5.80)
RDW: 16.5 % — ABNORMAL HIGH (ref 11.6–15.4)
WBC: 9.6 10*3/uL (ref 3.4–10.8)

## 2019-08-17 LAB — BASIC METABOLIC PANEL
BUN/Creatinine Ratio: 23 (ref 10–24)
BUN: 50 mg/dL — ABNORMAL HIGH (ref 8–27)
CO2: 19 mmol/L — ABNORMAL LOW (ref 20–29)
Calcium: 9.4 mg/dL (ref 8.6–10.2)
Chloride: 105 mmol/L (ref 96–106)
Creatinine, Ser: 2.18 mg/dL — ABNORMAL HIGH (ref 0.76–1.27)
GFR calc Af Amer: 34 mL/min/{1.73_m2} — ABNORMAL LOW (ref >59)
GFR calc non Af Amer: 29 mL/min/{1.73_m2} — ABNORMAL LOW (ref >59)
Glucose: 61 mg/dL — ABNORMAL LOW (ref 65–99)
Potassium: 4.6 mmol/L (ref 3.5–5.2)
Sodium: 141 mmol/L (ref 134–144)

## 2019-08-17 LAB — BRAIN NATRIURETIC PEPTIDE: BNP: 734.2 pg/mL — ABNORMAL HIGH (ref 0.0–100.0)

## 2019-08-17 MED ORDER — SODIUM CHLORIDE 0.9 % IV SOLN
510.0000 mg | INTRAVENOUS | Status: AC
  Administered 2019-08-17: 13:00:00 510 mg via INTRAVENOUS
  Filled 2019-08-17: qty 17

## 2019-08-17 NOTE — Progress Notes (Signed)
  Echocardiogram 2D Echocardiogram has been performed.  Jennette Dubin 08/17/2019, 12:10 PM

## 2019-08-20 ENCOUNTER — Telehealth: Payer: Self-pay | Admitting: *Deleted

## 2019-08-20 NOTE — Progress Notes (Signed)
Reviewed and agree with documentation and assessment and plan. K. Veena Marilynne Dupuis , MD   

## 2019-08-20 NOTE — Telephone Encounter (Signed)
MOVED APPOINTMENT TO  08/23/19 VRTUAL DAY   WIFE AWARE AN AGREE.

## 2019-08-21 ENCOUNTER — Telehealth: Payer: Medicare Other | Admitting: Internal Medicine

## 2019-08-23 ENCOUNTER — Telehealth (INDEPENDENT_AMBULATORY_CARE_PROVIDER_SITE_OTHER): Payer: Medicare Other | Admitting: Internal Medicine

## 2019-08-23 ENCOUNTER — Telehealth: Payer: Self-pay | Admitting: *Deleted

## 2019-08-23 VITALS — BP 140/85 | HR 87 | Ht 66.0 in | Wt 144.0 lb

## 2019-08-23 DIAGNOSIS — E785 Hyperlipidemia, unspecified: Secondary | ICD-10-CM

## 2019-08-23 DIAGNOSIS — D649 Anemia, unspecified: Secondary | ICD-10-CM

## 2019-08-23 DIAGNOSIS — I491 Atrial premature depolarization: Secondary | ICD-10-CM | POA: Diagnosis not present

## 2019-08-23 DIAGNOSIS — I499 Cardiac arrhythmia, unspecified: Secondary | ICD-10-CM

## 2019-08-23 DIAGNOSIS — I10 Essential (primary) hypertension: Secondary | ICD-10-CM

## 2019-08-23 DIAGNOSIS — I5031 Acute diastolic (congestive) heart failure: Secondary | ICD-10-CM

## 2019-08-23 DIAGNOSIS — Z79899 Other long term (current) drug therapy: Secondary | ICD-10-CM

## 2019-08-23 DIAGNOSIS — R0600 Dyspnea, unspecified: Secondary | ICD-10-CM

## 2019-08-23 DIAGNOSIS — R0609 Other forms of dyspnea: Secondary | ICD-10-CM

## 2019-08-23 MED ORDER — FUROSEMIDE 80 MG PO TABS: 80.0000 mg | ORAL_TABLET | Freq: Every day | ORAL | 3 refills | Status: DC

## 2019-08-23 NOTE — Patient Instructions (Addendum)
Medication Instructions:  START furosemide (Lasix) 80 mg daily  *If you need a refill on your cardiac medications before your next appointment, please call your pharmacy*  Lab Work: BMET, Myeloma panel ON Monday 2/15   Testing/Procedures: Your physician has recommended that you wear a 2 week event monitor. Event monitors are medical devices that record the heart's electrical activity. Doctors most often [place] these monitors to diagnose arrhythmias. Arrhythmias are problems with the speed or rhythm of the heartbeat. The monitor is a small, portable device. You can wear one while you do your normal daily activities. This is usually used to diagnose what is causing palpitations/syncope (passing out). --this will be mailed to you  Follow-Up: 2/16 at 9:45 AM with Coletta Memos NP at [facility] office

## 2019-08-23 NOTE — Telephone Encounter (Signed)
Patient enrolled for [employer] to ship a 14 day cardiac event monitor to his home.  Instructions sent via My Chart message.

## 2019-08-23 NOTE — Progress Notes (Addendum)
Virtual Visit via Telephone Note   This visit type was conducted due to national recommendations for restrictions regarding the COVID-19 Pandemic (e.g. social distancing) in an effort to limit this patient's exposure and mitigate transmission in our community.  Due to his co-morbid illnesses, this patient is at least at moderate risk for complications without adequate follow up.  This format is felt to be most appropriate for this patient at this time.  The patient did not have access to video technology/had technical difficulties with video requiring transitioning to audio format only (telephone).  All issues noted in this document were discussed and addressed.  No physical exam could be performed with this format.  Please refer to the patient's chart for his  consent to telehealth for [facility].   Date:  08/23/2019   ID:  Adam Reid, DOB 02-Aug-1945, MRN [id number]  Patient Location: Home Provider Location: Home  PCP:  [name], MD  Cardiologist:  Elouise Munroe, MD  Electrophysiologist:  None   Evaluation Performed:  Follow-Up Visit  Chief Complaint:  F/u SOB.  History of Present Illness:    Adam Reid is a 74 y.o. male with hypertension, hyperlipidemia, diabetes, chronic kidney disease, and previously abnormal stress test with concerning symptoms, resulting in coronary angiography in 2017 that demonstrated nonobstructive CAD. He presents today for follow up of test results.    BNP elevated to 734, echo with restrictive filling pattern and mildly increased LVH. He notes he is still SOB and now describes leg swelling. Wife says she uses steam in his room and this helps him breath easier.   Echocardiogram with preserved EF.   Not currently on diuretic therapy, has CKD follows with Dr. [name] in nephrology. Unexplained anemia, needs GI evaluation - currently on hold due to physical exam finding of irregular heart beats, likely represents PACs.   The patient  does not have symptoms concerning for COVID-19 infection (fever, chills, cough, or new shortness of breath).    Past Medical History:  Diagnosis Date  . Chronic kidney disease   . Diabetes mellitus without complication ([place])   . Hyperlipidemia   . Hypertension   . Thyroid disease    was on supplement, taken off by [name]   Past Surgical History:  Procedure Laterality Date  . APPENDECTOMY    . CARDIAC CATHETERIZATION N/A 01/30/2016   Procedure: Left Heart Cath and Coronary Angiography;  Surgeon: [name], MD;  Location: [facility] [facility];  Service: Cardiovascular;  Laterality: N/A;     Current Meds  Medication Sig  . albuterol (VENTOLIN HFA) 108 (90 Base) MCG/ACT inhaler Inhale 2 puffs into the lungs every 6 (six) hours as needed for wheezing or shortness of breath.  [name] aspirin EC 81 MG EC tablet Take 1 tablet (81 mg total) by mouth daily. (Patient taking differently: Take 81 mg by mouth every other day. )  . atorvastatin (LIPITOR) 80 MG tablet TAKE 1 TABLET(80 MG) BY MOUTH DAILY  . blood glucose meter kit and supplies Use up to two times daily as directed  ICD10 E10.9 E11.9  . Blood Glucose Monitoring Suppl (ONETOUCH VERIO) w/Device KIT USE UTD  . Cholecalciferol (VITAMIN D-3 PO) Take 1 tablet by mouth See admin instructions. Three times a week. No specific days  . citalopram (CELEXA) 20 MG tablet TAKE 1 TABLET(20 MG) BY MOUTH DAILY  . glipiZIDE (GLUCOTROL XL) 10 MG 24 hr tablet TAKE 1 TABLET(10 MG) BY MOUTH DAILY WITH BREAKFAST (Patient taking  differently: 12.5 mg. )  . GlucoCom Lancets MISC Use for home glucose monitoring  . glucose blood (ONETOUCH VERIO) test strip Test up to 2 times per day.  Uncontrolled diabetes with hyperglycemia and stage 4 CKD.  . metoprolol succinate (TOPROL-XL) 25 MG 24 hr tablet Take 1 tablet (25 mg total) by mouth daily.  . nitroGLYCERIN (NITROSTAT) 0.4 MG SL tablet Place 1 tablet (0.4 mg total) under the tongue every 5 (five) minutes x 3  doses as needed for chest pain.  . pioglitazone (ACTOS) 30 MG tablet TAKE 1 TABLET(30 MG) BY MOUTH DAILY  . polyethylene glycol (MIRALAX / GLYCOLAX) 17 g packet Take 17 g by mouth daily.  [name] senna (SENOKOT) 8.6 MG tablet Take 1 tablet by mouth as needed.   . sitaGLIPtin (JANUVIA) 25 MG tablet Take 1 tablet (25 mg total) by mouth daily.  . tamsulosin (FLOMAX) 0.4 MG CAPS capsule TAKE 2 CAPSULES(0.8 MG) BY MOUTH DAILY  . [DISCONTINUED] calcitRIOL (ROCALTROL) 0.25 MCG capsule daily.      Allergies:   Patient has no known allergies.   Social History   Tobacco Use  . Smoking status: Current Every Day Smoker    Packs/day: 0.25    Years: 48.00    Pack years: 12.00    Types: Cigarettes  . Smokeless tobacco: Never Used  . Tobacco comment: 09/07/16 3-4 cigs daily, trying to cut back  Substance Use Topics  . Alcohol use: No    Alcohol/week: 0.0 standard drinks  . Drug use: No     Family Hx: The patient's family history includes Heart disease (age of onset: 74) in his brother; Hyperlipidemia in his mother; Hypertension in his mother and sister; Kidney disease in his father. There is no history of Colon cancer.  ROS:   Please see the history of present illness.     All other systems reviewed and are negative.   Prior CV studies:   The following studies were reviewed today:  Echo  Labs/Other Tests and Data Reviewed:    EKG:  No ECG reviewed.  Recent Labs: 07/20/2019: ALT 13 08/16/2019: BNP 734.2; BUN 50; Creatinine, Ser 2.18; Hemoglobin 8.4; Platelets 405; Potassium 4.6; Sodium 141   Recent Lipid Panel Lab Results  Component Value Date/Time   CHOL 159 07/20/2019 02:06 PM   TRIG 127 07/20/2019 02:06 PM   HDL 80 07/20/2019 02:06 PM   CHOLHDL 2.0 07/20/2019 02:06 PM   CHOLHDL 2.1 03/26/2016 06:39 PM   LDLCALC 57 07/20/2019 02:06 PM    Wt Readings from Last 3 Encounters:  08/23/19 144 lb (65.3 kg)  08/17/19 144 lb (65.3 kg)  08/16/19 142 lb 3.2 oz (64.5 kg)     Objective:      Vital Signs:  BP 140/85   Pulse 87   Ht '5\' 6"'$  (1.676 m)   Wt 144 lb (65.3 kg)   BMI 23.24 kg/m    VITAL SIGNS:  reviewed GEN:  no acute distress NEURO:  alert and oriented x 3, no obvious focal deficit PSYCH:  normal affect  ASSESSMENT & PLAN:    1. Irregular heart rhythm   2. Acute diastolic (congestive) heart failure ([place])   3. Premature atrial contractions   4. Medication management   5. DOE (dyspnea on exertion)   6. Essential hypertension   7. Hyperlipidemia, unspecified hyperlipidemia type   8. Anemia, unspecified type    Irregular rhythm - likely represents PACs based on ECG and echocardiogram. We will complete a 2 week monitor  however this should not halt his evaluation by GI for colonoscopy to evaluate anemia. Anemia is likely a contributor to his SOB.   Acute diastolic HF - restrictive filling pattern on echo, elevated BNP and clinical features suggestive of heart failure. Will provide lasix 80 mg daily, with plan for BMP on Monday due to concerns regarding renal function. He should be seen in the office after labs for physical exam with regard to HF symptoms. With restrictive filling pattern, will also obtain myeloma panel to evaluate for possible cardiac amyloid. With anemia, renal failure, and restrictive filling will be best to rule out.  DOE - multifactoral with anemia, diastolic HF and deconditioning after COVID 19 infection. Will attempt lasix for HF symptoms and see in close follow up.   COVID-19 Education: The signs and symptoms of COVID-19 were discussed with the patient and how to seek care for testing (follow up with PCP or arrange E-visit).  The importance of social distancing was discussed today.  Time:   Today, I have spent 25 minutes with the patient with telehealth technology discussing the above problems.     Medication Adjustments/Labs and Tests Ordered: Current medicines are reviewed at length with the patient today.  Concerns regarding medicines  are outlined above.   Tests Ordered: Orders Placed This Encounter  Procedures  . Basic metabolic panel  . Multiple Myeloma Panel (SPEP&IFE w/QIG)  . CARDIAC EVENT MONITOR    Medication Changes: Meds ordered this encounter  Medications  . furosemide (LASIX) 80 MG tablet    Sig: Take 1 tablet (80 mg total) by mouth daily.    Dispense:  30 tablet    Refill:  3    Follow Up:  In Person next week with labs  Signed, Elouise Munroe, MD  08/23/2019 5:01 PM    [facility]

## 2019-08-24 NOTE — Progress Notes (Signed)
Cardiology Clinic Note   Patient Name: Adam Reid Date of Encounter: 08/28/2019  Primary Care Provider:  [name], MD Primary Cardiologist:  Adam Munroe, MD  Patient Profile    Adam Reid 74 year old male presents today for follow-up of his hypertension, shortness of breath, and HLD.  Past Medical History    Past Medical History:  Diagnosis Date  . Chronic kidney disease   . Diabetes mellitus without complication ([place])   . Hyperlipidemia   . Hypertension   . Thyroid disease    was on supplement, taken off by Adam Reid   Past Surgical History:  Procedure Laterality Date  . APPENDECTOMY    . CARDIAC CATHETERIZATION N/A 01/30/2016   Procedure: Left Heart Cath and Coronary Angiography;  Surgeon: Adam Booze, MD;  Location: [facility];  Service: Cardiovascular;  Laterality: N/A;    Allergies  No Known Allergies  History of Present Illness    Adam Reid was PMH of hypertension, hyperlipidemia, diabetes, chronic kidney disease, and abnormal stress test.  He underwent coronary angiography in 2017 that demonstrated nonobstructive coronary artery disease.  His BNP from 08/16/2019 was 734.  His echocardiogram from 08/17/2019 showed an LVEF of 55-60%, mild LVH, elevated left atrial pressures, grade 3 diastolic dysfunction.  A moderately dilated left atrial, trivial pericardial effusion, mild mitral valve regurgitation, mild tricuspid valve regurgitation and mildly elevated pulmonary artery pressures.  He was seen by Adam. Margaretann Reid on 08/23/2019.  At that time his diuretic therapy was restarted.  It was believed that his increased work of breathing was due to several factors including unexplained anemia, diastolic HF, irregular rhythm, possible amyloid and deconditioning post COVID-19 infection.  He presents the clinic today and states he feels his breathing is back to normal.  He does still have lower extremity edema.  He has not yet gone for his  GI evaluation/colonoscopy.  Him and his wife felt that they should wait until his cardiac evaluation was completed.  He does daily way and has noticed that he is increased in weight by about 4 pounds.  I have instructed him to go ahead with there GI evaluation, will continue his 80 mg of Lasix for another 3 days and then drop to 40 mg daily.  I will have him return in 1 week for follow-up BMP.  They have not yet received to the cardiac monitor however, his wife believes it may be arriving today.  He denies chest pain, shortness of breath, fatigue, palpitations, melena, hematuria, hemoptysis, diaphoresis, weakness, presyncope, syncope, orthopnea, and PND.   Home Medications    Prior to Admission medications   Medication Sig Start Date End Date Taking? Authorizing Provider  albuterol (VENTOLIN HFA) 108 (90 Base) MCG/ACT inhaler Inhale 2 puffs into the lungs every 6 (six) hours as needed for wheezing or shortness of breath. 07/02/19   Adam Reid  aspirin EC 81 MG EC tablet Take 1 tablet (81 mg total) by mouth daily. Patient taking differently: Take 81 mg by mouth every other day.  12/03/15   Reid, Adam Pelt, MD  atorvastatin (LIPITOR) 80 MG tablet TAKE 1 TABLET(80 MG) BY MOUTH DAILY 07/20/19   Adam Agreste, MD  blood glucose meter kit and supplies Use up to two times daily as directed  ICD10 E10.9 E11.9 02/20/19   Adam Agreste, MD  Blood Glucose Monitoring Suppl [facility] VERIO) w/Device KIT USE UTD 09/03/15   [provider]  Cholecalciferol (VITAMIN D-3 PO) Take 1  tablet by mouth See admin instructions. Three times a week. No specific days    [provider]  citalopram (CELEXA) 20 MG tablet TAKE 1 TABLET(20 MG) BY MOUTH DAILY 07/20/19   Adam Agreste, MD  cyanocobalamin 500 MCG tablet Take 500 mcg by mouth daily. Reported on 12/02/2015    [provider]  furosemide (LASIX) 80 MG tablet Take 1 tablet (80 mg total) by mouth daily. 08/23/19 11/21/19  Adam Munroe, MD  glipiZIDE (GLUCOTROL XL) 10 MG 24 hr tablet TAKE 1 TABLET(10 MG) BY MOUTH DAILY WITH BREAKFAST Patient taking differently: 12.5 mg.  07/30/19   Adam Agreste, MD  GlucoCom Lancets MISC Use for home glucose monitoring 09/02/15   Harrison Mons, PA  glucose blood (ONETOUCH VERIO) test strip Test up to 2 times per day.  Uncontrolled diabetes with hyperglycemia and stage 4 CKD. 04/13/19   Adam Agreste, MD  metoprolol succinate (TOPROL-XL) 25 MG 24 hr tablet Take 1 tablet (25 mg total) by mouth daily. 08/10/19   Adam Agreste, MD  nitroGLYCERIN (NITROSTAT) 0.4 MG SL tablet Place 1 tablet (0.4 mg total) under the tongue every 5 (five) minutes x 3 doses as needed for chest pain. 01/31/16   Adam Leas, Reid  pioglitazone (ACTOS) 30 MG tablet TAKE 1 TABLET(30 MG) BY MOUTH DAILY 07/20/19   Adam Agreste, MD  polyethylene glycol (MIRALAX / GLYCOLAX) 17 g packet Take 17 g by mouth daily.    [provider]  senna (SENOKOT) 8.6 MG tablet Take 1 tablet by mouth as needed.     [provider]  sitaGLIPtin (JANUVIA) 25 MG tablet Take 1 tablet (25 mg total) by mouth daily. 07/09/19   Adam Agreste, MD  tamsulosin (FLOMAX) 0.4 MG CAPS capsule TAKE 2 CAPSULES(0.8 MG) BY MOUTH DAILY 07/20/19   Adam Agreste, MD    Family History    Family History  Problem Relation Age of Onset  . Hypertension Mother   . Hyperlipidemia Mother   . Hypertension Sister   . Kidney disease Father   . Heart disease Brother 53       open heart surgery  . Colon cancer Neg Hx    He indicated that his mother is deceased. He indicated that his father is deceased. He indicated that his sister is alive. He indicated that his brother is deceased. He indicated that his son is alive. He indicated that the status of his neg hx is unknown.  Social History    Social History   Socioeconomic History  . Marital status: Married    Spouse name: Adam Reid  . Number of children: 1  . Years of  education: 12th grade  . Highest education level: Not on file  Occupational History  . Occupation: Retired-accountant  Tobacco Use  . Smoking status: Former Smoker    Packs/day: 0.25    Years: 48.00    Pack years: 12.00    Types: Cigarettes    Quit date: 07/16/2019    Years since quitting: 0.1  . Smokeless tobacco: Never Used  . Tobacco comment: 09/07/16 3-4 cigs daily, trying to cut back  Substance and Sexual Activity  . Alcohol use: No    Alcohol/week: 0.0 standard drinks  . Drug use: No  . Sexual activity: Never    Partners: Female  Other Topics Concern  . Not on file  Social History Narrative   Originally from [place]. Came to the [place] in 2009, following their  son who came here for school.   Married.   Lives with his wife.   Their adult son lives in [place], [place], where he is in optometry school.   Education: [employer]   Exercise: No   Social Determinants of Radio broadcast assistant Strain:   . Difficulty of Paying Living Expenses: Not on file  Food Insecurity:   . Worried About Charity fundraiser in the Last Year: Not on file  . Ran Out of Food in the Last Year: Not on file  Transportation Needs:   . Lack of Transportation (Medical): Not on file  . Lack of Transportation (Non-Medical): Not on file  Physical Activity:   . Days of Exercise per Week: Not on file  . Minutes of Exercise per Session: Not on file  Stress:   . Feeling of Stress : Not on file  Social Connections:   . Frequency of Communication with Friends and Family: Not on file  . Frequency of Social Gatherings with Friends and Family: Not on file  . Attends Religious Services: Not on file  . Active Member of Clubs or Organizations: Not on file  . Attends Archivist Meetings: Not on file  . Marital Status: Not on file  Intimate Partner Violence:   . Fear of Current or Ex-Partner: Not on file  . Emotionally Abused: Not on file  . Physically Abused: Not on file  . Sexually Abused: Not on  file     Review of Systems    General:  No chills, fever, night sweats or weight changes.  Cardiovascular:  No chest pain, dyspnea on exertion, edema, orthopnea, palpitations, paroxysmal nocturnal dyspnea. Dermatological: No rash, lesions/masses Respiratory: No cough, dyspnea Urologic: No hematuria, dysuria Abdominal:   No nausea, vomiting, diarrhea, bright red blood per rectum, melena, or hematemesis Neurologic:  No visual changes, wkns, changes in mental status. All other systems reviewed and are otherwise negative except as noted above.  Physical Exam    VS:  BP (!) 144/76 (BP Location: Right Arm, Patient Position: Sitting, Cuff Size: Normal)   Pulse 88   Ht _0  (1.676 m)   Wt 140 lb 3.2 oz (63.6 kg)   SpO2 92%   BMI 22.63 kg/m  , BMI Body mass index is 22.63 kg/m. GEN: Well nourished, well developed, in no acute distress. HEENT: normal. Neck: Supple, no JVD, carotid bruits, or masses. Cardiac: RRR, no murmurs, rubs, or gallops. No clubbing, cyanosis, +2 pitting bilateral lower extremity edema.  Radials/DP/PT 2+ and equal bilaterally.  Respiratory:  Respirations regular and unlabored, clear to auscultation bilaterally. GI: Soft, nontender, nondistended, BS + x 4. MS: no deformity or atrophy. Skin: warm and dry, no rash. Neuro:  Strength and sensation are intact. Psych: Normal affect.  Accessory Clinical Findings    ECG personally reviewed by me today-none today  EKG 08/16/2019 Sinus rhythm with PACs QT prolongation 90 bpm  Echocardiogram 08/17/2019 IMPRESSIONS    1. Left ventricular ejection fraction, by visual estimation, is 55 to  60%. The left ventricle has normal function. There is mildly increased  left ventricular hypertrophy.  2. Elevated left atrial pressure.  3. Left ventricular diastolic parameters are consistent with Grade III  diastolic dysfunction (restrictive).  4. The left ventricle has no regional wall motion abnormalities.  5. Global right  ventricle has normal systolic function.The right  ventricular size is normal.  6. Left atrial size was moderately dilated.  7. Right atrial size was normal.  8. Trivial pericardial effusion is present.  9. The mitral valve is normal in structure. Mild mitral valve  regurgitation. No evidence of mitral stenosis.  10. The tricuspid valve is normal in structure. Tricuspid valve  regurgitation is mild.  11. The aortic valve is tricuspid. Aortic valve regurgitation is not  visualized. No evidence of aortic valve sclerosis or stenosis.  12. The pulmonic valve was normal in structure. Pulmonic valve  regurgitation is trivial.  13. Mildly elevated pulmonary artery systolic pressure.  14. The inferior vena cava is normal in size with greater than 50%  respiratory variability, suggesting right atrial pressure of 3 mmHg.  15. Normal LV systolic function; mild LVH; restrictive filling; moderate  LAE; mild MR.  Cardiac catheterization 01/30/2016  Ramus lesion, 20% stenosed.  Ost 2nd Diag to 2nd Diag lesion, 50% stenosed.  Mid LAD lesion, 20% stenosed.  Normal LVEDP.   Continue preventive therapy.  Aggressive hydration post procedure due to renal insufficiency.  Discussed with Adam. Wynonia Lawman.   Assessment & Plan   1.  Acute diastolic heart failure-BNP elevated at 734.  Lasix restarted 08/23/2019.echocardiogram from 08/17/2019 showed an LVEF of 55-60%, mild LVH, elevated left atrial pressures, grade 3 diastolic dysfunction. Continue furosemide 80 mg tablet daily for 3 days then take 40 mg daily. Take potassium 20 mEq x 3 days then stop Continue metoprolol succinate 25 mg daily Heart healthy low-sodium diet-salty 6 given Increase physical activity as tolerated Daily weights-weight log given Order BMP in 1 week Educated about heart failure-handout given with goal weight  Dyspnea on exertion-Breathing has returned to baseline.  Believed to be multifactorial due to anemia, diastolic HF, and  deconditioning after COVID-19 infection Continue furosemide 80 mg tablet daily Waiting on GI eval for Holter monitor results. Increase physical activity as tolerated  Nonobstructive coronary artery disease-cardiac catheterization 01/2016 showed 20% ramus lesion, 50% Ost 2nd diagonal to second diagonal lesion, 20% mid LAD lesion Continue atorvastatin 80 mg tablet daily Continue aspirin 81 mg tablet daily  Irregular heart rhythm-patient currently with 14-day monitor.  Heart rate today 88. Cardiac monitor ordered.  Disposition: Follow-up with me in 1 week.   Jossie Ng. [facility] [place] [address] Office [phone] Fax [phone]

## 2019-08-26 MED ORDER — FUROSEMIDE 80 MG PO TABS: 80.0000 mg | ORAL_TABLET | Freq: Every day | ORAL | 0 refills | Status: DC

## 2019-08-28 ENCOUNTER — Other Ambulatory Visit: Payer: Self-pay | Admitting: Internal Medicine

## 2019-08-28 ENCOUNTER — Encounter: Payer: Self-pay | Admitting: General Practice

## 2019-08-28 ENCOUNTER — Other Ambulatory Visit: Payer: Self-pay

## 2019-08-28 ENCOUNTER — Ambulatory Visit (INDEPENDENT_AMBULATORY_CARE_PROVIDER_SITE_OTHER): Payer: Medicare Other | Admitting: General Practice

## 2019-08-28 VITALS — BP 144/76 | HR 88 | Ht 66.0 in | Wt 140.2 lb

## 2019-08-28 DIAGNOSIS — Z79899 Other long term (current) drug therapy: Secondary | ICD-10-CM

## 2019-08-28 DIAGNOSIS — R0609 Other forms of dyspnea: Secondary | ICD-10-CM

## 2019-08-28 DIAGNOSIS — R0600 Dyspnea, unspecified: Secondary | ICD-10-CM | POA: Diagnosis not present

## 2019-08-28 DIAGNOSIS — I5031 Acute diastolic (congestive) heart failure: Secondary | ICD-10-CM

## 2019-08-28 DIAGNOSIS — I10 Essential (primary) hypertension: Secondary | ICD-10-CM | POA: Diagnosis not present

## 2019-08-28 LAB — BASIC METABOLIC PANEL
BUN/Creatinine Ratio: 23 (ref 10–24)
BUN: 46 mg/dL — ABNORMAL HIGH (ref 8–27)
CO2: 19 mmol/L — ABNORMAL LOW (ref 20–29)
Calcium: 9.1 mg/dL (ref 8.6–10.2)
Chloride: 100 mmol/L (ref 96–106)
Creatinine, Ser: 1.99 mg/dL — ABNORMAL HIGH (ref 0.76–1.27)
GFR calc Af Amer: 38 mL/min/{1.73_m2} — ABNORMAL LOW (ref >59)
GFR calc non Af Amer: 33 mL/min/{1.73_m2} — ABNORMAL LOW (ref >59)
Glucose: 124 mg/dL — ABNORMAL HIGH (ref 65–99)
Potassium: 4.3 mmol/L (ref 3.5–5.2)
Sodium: 139 mmol/L (ref 134–144)

## 2019-08-28 NOTE — Patient Instructions (Signed)
Medication Instructions:  TAKE LASIX 80MG  AND POTASSIUM X3 DAYS THEN BACK TO LASIX 40MG  AND STOP POTASSIUM. If you need a refill on your cardiac medications before your next appointment, please call your pharmacy.  Labwork: BMET IN 1 WEEK HERE IN OUR OFFICE AT [facility]    You will NOT need to fast   If you have labs (blood work) drawn today and your tests are completely normal, you will receive your results only by: [name] MyChart Message (if you have MyChart) OR A paper copy in the mail If you have any lab test that is abnormal or we need to change your treatment, we will call you to review these results. You may go to any [facility] lab that is convenient for you however, we do have a lab in our office that is able to assist you. You do NOT need an appointment for our lab. Once in our office in our office lobby there is a podium where you sign-in and ring the doorbell to alert [place] that you are here. Lab is open 8:00am and closes at 4:00pm; closes for lunch from 12:45 - 1:45pm. PLEASE BRING A COPY OF YOUR INSURANCE CARD WITH YOU. Special Instructions: Daily Weight Record: It is important to weigh yourself daily. Keep your daily weight chart near your scale. Weigh yourself each morning at the same time. Weigh yourself without shoes, and try to wear the same amount of clothing each day. Compare today's weight to yesterday's weight.  Bring this form with you to your follow-up appointments.   PLEAS READ AND FOLLOW HEART FAILURE ATTACHMENT  PLEASE READ AND FOLLOW SALTY 6 ATTACHED  Reduce your risk of getting COVID-19 With your heart disease it is especially important for people at increased risk of severe illness from COVID-19, and those who live with them, to protect themselves from getting COVID-19. The best way to protect yourself and to help reduce the spread of the virus that causes COVID-19 is to: [name] Limit your interactions with other people as much as possible. . Take precautions to prevent getting  COVID-19 when you do interact with others. If you start feeling sick and think you may have COVID-19, get in touch with your healthcare provider within 24 hours.  Follow-Up: ONE WEEK In Person Coletta Memos, FNP.    At [facility], you and your health needs are our priority.  As part of our continuing mission to provide you with exceptional heart care, we have created designated Provider Care Teams.  These Care Teams include your primary Cardiologist (physician) and Advanced Practice Providers (APPs -  Physician Assistants and Nurse Practitioners) who all work together to provide you with the care you need, when you need it.  Thank you for choosing [facility] at [facility]!!        Heart Failure Action Plan A heart failure action plan helps you understand what to do when you have symptoms of heart failure. Follow the plan that was created by you and your health care provider. Review your plan each time you visit your health care provider.  Follow these instructions at home:  Take over-the-counter and prescription medicines only as told by your health care provider.  Weigh yourself daily. Your target weight is 130lb. in a day, or more than 5lb in one week.  Eat a heart-healthy diet. Work with a diet and nutrition specialist (dietitian) to create an eating plan that is best for you.  Keep all follow-up visits as told by your health care provider.  This is important.  Red zone These signs and symptoms mean you should get medical help right away:  You have trouble breathing when resting.  You have a dry cough that is getting worse.  You have swelling or pain in your legs or abdomen that is getting worse.  You suddenly gain more than 2-3 lb (0.9-1.4 kg) in a day, or more than 5 lb (2.3 kg) in one week. This amount may be more or less depending on your condition.  You have trouble staying awake or you feel confused.  You have chest pain.  You do not have an appetite.  You  pass out. If you experience any of these symptoms:  Call your local emergency services (911 in the [place] right away or seek help at the emergency department of the nearest hospital. Yellow zone These signs and symptoms mean your condition may be getting worse and you should make some changes:  You have trouble breathing when you are active or you need to sleep with extra pillows.  You have swelling in your legs or abdomen.  You gain 2-3 lb (0.9-1.4 kg) in one day, or 5 lb (2.3 kg) in one week. This amount may be more or less depending on your condition.  You get tired easily.  You have trouble sleeping.  You have a dry cough. If you experience any of these symptoms:  Contact your health care provider within the next day.  Your health care provider may adjust your medicines. Green zone These signs mean you are doing well and can continue what you are doing:  You do not have shortness of breath.  You have very little swelling or no new swelling.  Your weight is stable (no gain or loss).  You have a normal activity level.  You do not have chest pain or any other new symptoms. Where to find more information  American Heart Association: www.heart.org Summary  Follow the action plan that was created by you and your health care provider.  Get help right away if you have any symptoms in the Red zone. This information is not intended to replace advice given to you by your health care provider. Make sure you discuss any questions you have with your health care provider. Document Revised: 06/10/2017 Document Reviewed: 08/07/2016 Elsevier Patient Education  2020 [employer].

## 2019-08-29 ENCOUNTER — Telehealth: Payer: Self-pay | Admitting: General Practice

## 2019-08-29 MED ORDER — POTASSIUM CHLORIDE CRYS ER 20 MEQ PO TBCR: EXTENDED_RELEASE_TABLET | ORAL | 0 refills | Status: DC

## 2019-08-29 NOTE — Telephone Encounter (Signed)
Patient's wife is calling stating when the went to go pick up the new medications from previous appointment the pharmacy did not have the potasium Coletta Memos stated he would prescribe. Please advise.

## 2019-08-29 NOTE — Telephone Encounter (Signed)
Returned call to patient's wife.She stated husband saw Coletta Memos NP yesterday.Stated potassium was not sent to pharmacy.Advised I will send Kdur 20 meq daily for 3 days only to pharmacy.

## 2019-08-30 LAB — MULTIPLE MYELOMA PANEL, SERUM
Albumin SerPl Elph-Mcnc: 3.2 g/dL (ref 2.9–4.4)
Albumin/Glob SerPl: 0.9 (ref 0.7–1.7)
Alpha 1: 0.5 g/dL — ABNORMAL HIGH (ref 0.0–0.4)
Alpha2 Glob SerPl Elph-Mcnc: 1.2 g/dL — ABNORMAL HIGH (ref 0.4–1.0)
B-Globulin SerPl Elph-Mcnc: 1 g/dL (ref 0.7–1.3)
Gamma Glob SerPl Elph-Mcnc: 1 g/dL (ref 0.4–1.8)
Globulin, Total: 3.7 g/dL (ref 2.2–3.9)
IgA/Immunoglobulin A, Serum: 231 mg/dL (ref 61–437)
IgG (Immunoglobin G), Serum: 1195 mg/dL (ref 603–1613)
IgM (Immunoglobulin M), Srm: 55 mg/dL (ref 15–143)
Total Protein: 6.9 g/dL (ref 6.0–8.5)

## 2019-09-03 LAB — BASIC METABOLIC PANEL
BUN/Creatinine Ratio: 20 (ref 10–24)
BUN: 48 mg/dL — ABNORMAL HIGH (ref 8–27)
CO2: 23 mmol/L (ref 20–29)
Calcium: 9.1 mg/dL (ref 8.6–10.2)
Chloride: 98 mmol/L (ref 96–106)
Creatinine, Ser: 2.36 mg/dL — ABNORMAL HIGH (ref 0.76–1.27)
GFR calc Af Amer: 31 mL/min/{1.73_m2} — ABNORMAL LOW (ref >59)
GFR calc non Af Amer: 27 mL/min/{1.73_m2} — ABNORMAL LOW (ref >59)
Glucose: 174 mg/dL — ABNORMAL HIGH (ref 65–99)
Potassium: 4.3 mmol/L (ref 3.5–5.2)
Sodium: 139 mmol/L (ref 134–144)

## 2019-09-03 NOTE — Progress Notes (Signed)
Cardiology Clinic Note   Patient Name: Adam Reid Date of Encounter: 09/04/2019  Primary Care Provider:  [name], MD Primary Cardiologist:  Adam Munroe, MD  Patient Profile    Adam Reid 74 year old male presents today for follow-up of his hypertension, shortness of breath, and HLD.  Past Medical History    Past Medical History:  Diagnosis Date  . Chronic kidney disease   . Diabetes mellitus without complication ([place])   . Hyperlipidemia   . Hypertension   . Thyroid disease    was on supplement, taken off by Adam Reid   Past Surgical History:  Procedure Laterality Date  . APPENDECTOMY    . CARDIAC CATHETERIZATION N/A 01/30/2016   Procedure: Left Heart Cath and Coronary Angiography;  Surgeon: Adam Booze, MD;  Location: [facility] [place] [facility];  Service: Cardiovascular;  Laterality: N/A;    Allergies  No Known Allergies  History of Present Illness    Adam Reid was PMH of hypertension, hyperlipidemia, diabetes, chronic kidney disease, and abnormal stress test.  He underwent coronary angiography in 2017 that demonstrated nonobstructive coronary artery disease.  His BNP from 08/16/2019 was 734.  His echocardiogram from 08/17/2019 showed an LVEF of 55-60%, mild LVH, elevated left atrial pressures, grade 3 diastolic dysfunction.  A moderately dilated left atrial, trivial pericardial effusion, mild mitral valve regurgitation, mild tricuspid valve regurgitation and mildly elevated pulmonary artery pressures.  He was seen by Adam. Margaretann Reid on 08/23/2019.  At that time his diuretic therapy was restarted.  It was believed that his increased work of breathing was due to several factors including unexplained anemia, diastolic HF, irregular rhythm, possible amyloid and deconditioning post COVID-19 infection.  He presented to the clinic 08/28/19 and stated he felt his breathing was back to normal.  He did still have lower extremity edema.  He had not yet gone  for his GI evaluation/colonoscopy.  Him and his wife felt that they should wait until his cardiac evaluation was completed.  He was weighing daily  and had noticed that his  weight was up 4 pounds.  I  instructed him to go ahead with the GI evaluation, continued his 80 mg of Lasix for another 3 days and then decreased to 40 mg daily.   They had not yet received a cardiac monitor at that time.  He presents to the clinic today for follow-up and states he is feeling much better.  He does have some residual lower extremity edema.  He is able to get his shoes on at this point.  He has also been wearing lower extremity support stockings but feels they are comfortable.  He is trying to follow a lower sodium diet and weighing daily.  They have received the cardiac monitor but have not applied it yet.  They have been instructed to apply the cardiac monitor this evening.  They also stated that they have not had colonoscopy procedure completed.  I have encouraged them to schedule the colonoscopy and follow-up with Adam. Margaretann Reid after the Adam Reid LP patch has been returned.  They expressed understanding no further questions at this time.  He denies chest pain, shortness of breath, fatigue, palpitations, melena, hematuria, hemoptysis, diaphoresis, weakness, presyncope, syncope, orthopnea, and PND.  Home Medications    Prior to Admission medications   Medication Sig Start Date End Date Taking? Authorizing Provider  albuterol (VENTOLIN HFA) 108 (90 Base) MCG/ACT inhaler Inhale 2 puffs into the lungs every 6 (six) hours as needed for wheezing or shortness  of breath. 07/02/19   Adam Coss, NP  aspirin EC 81 MG EC tablet Take 1 tablet (81 mg total) by mouth daily. Patient taking differently: Take 81 mg by mouth every other day.  12/03/15   Adam Reid, Adam Pelt, MD  atorvastatin (LIPITOR) 80 MG tablet TAKE 1 TABLET(80 MG) BY MOUTH DAILY 07/20/19   Adam Agreste, MD  blood glucose meter kit and supplies Use up to two times daily  as directed  ICD10 E10.9 E11.9 02/20/19   Adam Agreste, MD  Blood Glucose Monitoring Suppl [facility] VERIO) w/Device KIT USE UTD 09/03/15   [provider]  Cholecalciferol (VITAMIN D-3 PO) Take 1 tablet by mouth See admin instructions. Three times a week. No specific days    [provider]  citalopram (CELEXA) 20 MG tablet TAKE 1 TABLET(20 MG) BY MOUTH DAILY 07/20/19   Adam Agreste, MD  cyanocobalamin 500 MCG tablet Take 500 mcg by mouth daily. Reported on 12/02/2015    [provider]  furosemide (LASIX) 80 MG tablet Take 1 tablet (80 mg total) by mouth daily. 08/26/19 11/24/19  Adam Munroe, MD  glipiZIDE (GLUCOTROL XL) 10 MG 24 hr tablet TAKE 1 TABLET(10 MG) BY MOUTH DAILY WITH BREAKFAST Patient taking differently: 12.5 mg.  07/30/19   Adam Agreste, MD  GlucoCom Lancets MISC Use for home glucose monitoring 09/02/15   Adam Mons, PA  glucose blood (ONETOUCH VERIO) test strip Test up to 2 times per day.  Uncontrolled diabetes with hyperglycemia and stage 4 CKD. 04/13/19   Adam Agreste, MD  metoprolol succinate (TOPROL-XL) 25 MG 24 hr tablet Take 1 tablet (25 mg total) by mouth daily. 08/10/19   Adam Agreste, MD  nitroGLYCERIN (NITROSTAT) 0.4 MG SL tablet Place 1 tablet (0.4 mg total) under the tongue every 5 (five) minutes x 3 doses as needed for chest pain. 01/31/16   Adam Leas, NP  pioglitazone (ACTOS) 30 MG tablet TAKE 1 TABLET(30 MG) BY MOUTH DAILY 07/20/19   Adam Agreste, MD  polyethylene glycol (MIRALAX / GLYCOLAX) 17 g packet Take 17 g by mouth daily.    [provider]  potassium chloride SA (KLOR-CON) 20 MEQ tablet Take 20 meq daily for 3 days only 08/29/19   Adam Pelton, NP  senna (SENOKOT) 8.6 MG tablet Take 1 tablet by mouth as needed.     [provider]  sitaGLIPtin (JANUVIA) 25 MG tablet Take 1 tablet (25 mg total) by mouth daily. 07/09/19   Adam Agreste, MD  tamsulosin (FLOMAX) 0.4 MG CAPS  capsule TAKE 2 CAPSULES(0.8 MG) BY MOUTH DAILY 07/20/19   Adam Agreste, MD    Family History    Family History  Problem Relation Age of Onset  . Hypertension Mother   . Hyperlipidemia Mother   . Hypertension Sister   . Kidney disease Father   . Heart disease Brother 81       open heart surgery  . Colon cancer Neg Hx    He indicated that his mother is deceased. He indicated that his father is deceased. He indicated that his sister is alive. He indicated that his brother is deceased. He indicated that his son is alive. He indicated that the status of his neg hx is unknown.  Social History    Social History   Socioeconomic History  . Marital status: Married    Spouse name: [name]  . Number of children: 1  . Years of education:  12th grade  . Highest education level: Not on file  Occupational History  . Occupation: Retired-accountant  Tobacco Use  . Smoking status: Former Smoker    Packs/day: 0.25    Years: 48.00    Pack years: 12.00    Types: Cigarettes    Quit date: 07/16/2019    Years since quitting: 0.1  . Smokeless tobacco: Never Used  . Tobacco comment: 09/07/16 3-4 cigs daily, trying to cut back  Substance and Sexual Activity  . Alcohol use: No    Alcohol/week: 0.0 standard drinks  . Drug use: No  . Sexual activity: Never    Partners: Female  Other Topics Concern  . Not on file  Social History Narrative   Originally from [place]. Came to the [place] in 2009, following their son who came here for school.   Married.   Lives with his wife.   Their adult son lives in [place], [place], where he is in optometry school.   Education: [employer]   Exercise: No   Social Determinants of Radio broadcast assistant Strain:   . Difficulty of Paying Living Expenses: Not on file  Food Insecurity:   . Worried About Charity fundraiser in the Last Year: Not on file  . Ran Out of Food in the Last Year: Not on file  Transportation Needs:   . Lack of Transportation (Medical): Not on  file  . Lack of Transportation (Non-Medical): Not on file  Physical Activity:   . Days of Exercise per Week: Not on file  . Minutes of Exercise per Session: Not on file  Stress:   . Feeling of Stress : Not on file  Social Connections:   . Frequency of Communication with Friends and Family: Not on file  . Frequency of Social Gatherings with Friends and Family: Not on file  . Attends Religious Services: Not on file  . Active Member of Clubs or Organizations: Not on file  . Attends Archivist Meetings: Not on file  . Marital Status: Not on file  Intimate Partner Violence:   . Fear of Current or Ex-Partner: Not on file  . Emotionally Abused: Not on file  . Physically Abused: Not on file  . Sexually Abused: Not on file     Review of Systems    General:  No chills, fever, night sweats or weight changes.  Cardiovascular:  No chest pain, dyspnea on exertion, edema, orthopnea, palpitations, paroxysmal nocturnal dyspnea. Dermatological: No rash, lesions/masses Respiratory: No cough, dyspnea Urologic: No hematuria, dysuria Abdominal:   No nausea, vomiting, diarrhea, bright red blood per rectum, melena, or hematemesis Neurologic:  No visual changes, wkns, changes in mental status. All other systems reviewed and are otherwise negative except as noted above.  Physical Exam    VS:  BP 111/75 (BP Location: Left Arm, Patient Position: Sitting, Cuff Size: Normal)   Pulse 62   Wt 136 lb 9.6 oz (62 kg)   SpO2 95%   BMI 22.05 kg/m  , BMI Body mass index is 22.05 kg/m. GEN: Well nourished, well developed, in no acute distress. HEENT: normal. Neck: Supple, no JVD, carotid bruits, or masses. Cardiac: RRR, no murmurs, rubs, or gallops. No clubbing, cyanosis, nonpitting lower extremity edema.  Radials/DP/PT 2+ and equal bilaterally.  Respiratory:  Respirations regular and unlabored, clear to auscultation bilaterally. GI: Soft, nontender, nondistended, BS + x 4. MS: no deformity or  atrophy. Skin: warm and dry, no rash. Neuro:  Strength and sensation are  intact. Psych: Normal affect.  Accessory Clinical Findings    ECG personally reviewed by me today-none today  EKG 08/16/2019 Sinus rhythm with PACs QT prolongation 90 bpm  Echocardiogram 08/17/2019 IMPRESSIONS    1. Left ventricular ejection fraction, by visual estimation, is 55 to  60%. The left ventricle has normal function. There is mildly increased  left ventricular hypertrophy.  2. Elevated left atrial pressure.  3. Left ventricular diastolic parameters are consistent with Grade III  diastolic dysfunction (restrictive).  4. The left ventricle has no regional wall motion abnormalities.  5. Global right ventricle has normal systolic function.The right  ventricular size is normal.  6. Left atrial size was moderately dilated.  7. Right atrial size was normal.  8. Trivial pericardial effusion is present.  9. The mitral valve is normal in structure. Mild mitral valve  regurgitation. No evidence of mitral stenosis.  10. The tricuspid valve is normal in structure. Tricuspid valve  regurgitation is mild.  11. The aortic valve is tricuspid. Aortic valve regurgitation is not  visualized. No evidence of aortic valve sclerosis or stenosis.  12. The pulmonic valve was normal in structure. Pulmonic valve  regurgitation is trivial.  13. Mildly elevated pulmonary artery systolic pressure.  14. The inferior vena cava is normal in size with greater than 50%  respiratory variability, suggesting right atrial pressure of 3 mmHg.  15. Normal LV systolic function; mild LVH; restrictive filling; moderate  LAE; mild MR.  Cardiac catheterization 01/30/2016  Ramus lesion, 20% stenosed.  Ost 2nd Diag to 2nd Diag lesion, 50% stenosed.  Mid LAD lesion, 20% stenosed.  Normal LVEDP.  Continue preventive therapy. Aggressive hydration post procedure due to renal insufficiency. Discussed with Adam.  Wynonia Lawman.  Assessment & Plan   1.  Acute diastolic heart failure-BNP elevated at 734.  Lasix was restarted 08/23/2019.echocardiogram from 08/17/2019 showed an LVEF of 55-60%, mild LVH, elevated left atrial pressures, grade 3 diastolic dysfunction.  Follow-up BMP showed creatinine at 2.36.  Low suspicion for myeloid given unremarkable multiple myeloma panel. Change furosemide to 40 mg as needed for weight gain of 3 pounds overnight or 5 pounds in a week and lower extremity edema.  Dry weight 134 pounds Continue metoprolol succinate 25 mg daily Heart healthy low-sodium diet-salty 6 given Increase physical activity as tolerated Daily weights-weight log given Educated about heart failure-handout given with goal weight  Dyspnea on exertion-Breathing remains at baseline.  Believed to be multifactorial due to anemia, diastolic HF, and deconditioning after COVID-19 infection Change furosemide to 40 mg as needed for weight gain of 3 pounds overnight or 5 pounds in a week and lower extremity edema.  Dry weight 134 pounds Encouraged to schedule colonoscopy Increase physical activity as tolerated  Nonobstructive coronary artery disease-cardiac catheterization 01/2016 showed 20% ramus lesion, 50% Ost 2nd diagonal to second diagonal lesion, 20% mid LAD lesion Continue atorvastatin 80 mg tablet daily Continue aspirin 81 mg tablet daily  Irregular heart rhythm-patient currently with 14-day monitor.  Heart rate today 88. Cardiac monitor has been received patient will apply after appointment. Instructions given on how to use ZIO monitor. Patient and his wife expressed understanding no further questions at this time.  Disposition: Follow-up with Adam. Margaretann Reid after zio results.    Jossie Ng. [facility] [address] Office [phone] Fax [phone]

## 2019-09-04 ENCOUNTER — Other Ambulatory Visit: Payer: Self-pay

## 2019-09-04 ENCOUNTER — Ambulatory Visit (INDEPENDENT_AMBULATORY_CARE_PROVIDER_SITE_OTHER): Payer: Medicare Other | Admitting: General Practice

## 2019-09-04 ENCOUNTER — Encounter: Payer: Self-pay | Admitting: General Practice

## 2019-09-04 VITALS — BP 111/75 | HR 62 | Wt 136.6 lb

## 2019-09-04 DIAGNOSIS — I5031 Acute diastolic (congestive) heart failure: Secondary | ICD-10-CM

## 2019-09-04 DIAGNOSIS — Z79899 Other long term (current) drug therapy: Secondary | ICD-10-CM

## 2019-09-04 DIAGNOSIS — R0600 Dyspnea, unspecified: Secondary | ICD-10-CM

## 2019-09-04 DIAGNOSIS — I499 Cardiac arrhythmia, unspecified: Secondary | ICD-10-CM

## 2019-09-04 DIAGNOSIS — I10 Essential (primary) hypertension: Secondary | ICD-10-CM | POA: Diagnosis not present

## 2019-09-04 DIAGNOSIS — R0609 Other forms of dyspnea: Secondary | ICD-10-CM

## 2019-09-04 NOTE — Patient Instructions (Addendum)
Medication Instructions:  TAKE LASIX 40MG  AS NEEDED FOR WEIGHT GAIN/SWELLING; YOU MAY TAKE ADDITIONAL LASIX AND POTASSIUM, IF YOUR WEIGHT IS >3 POUNDS DAILY OR >5 POUNDS IN ONE WEEK. PLEASE CALL OUR OFFICE TO LET us KNOW WHEN YOU HAVE INCREASED THESE MEDICATIONS [phone].   STOP POTASSIUM-TAKE WITH AS NEEDED WITH LASIX If you need a refill on your cardiac medications before your next appointment, please call your pharmacy.  Special Instructions: PLEASE PURCHASE AND WEAR COMPRESSION STOCKINGS DAILY AND OFF AT BEDTIME. Compression stockings are elastic socks that squeeze the legs. They help to increase blood flow to the legs and to decrease swelling in the legs from fluid retention, and reduce the chance of developing blood clots in the lower legs. PLEASE ELEVATE LOWER EXTREMITIES WHEN SITTING  PLEASE READ AND FOLLOW SALTY 6 ATTACHED  Follow-Up: AFTER MONITOR-ABOUT 1 MONTH In Person Adam Kaiser, MD [place], FNP-C.    At [facility], you and your health needs are our priority.  As part of our continuing mission to provide you with exceptional heart care, we have created designated Provider Care Teams.  These Care Teams include your primary Cardiologist (physician) and Advanced Practice Providers (APPs -  Physician Assistants and Nurse Practitioners) who all work together to provide you with the care you need, when you need it.  Reduce your risk of getting COVID-19 With your heart disease it is especially important for people at increased risk of severe illness from COVID-19, and those who live with them, to protect themselves from getting COVID-19. The best way to protect yourself and to help reduce the spread of the virus that causes COVID-19 is to: [name] Limit your interactions with other people as much as possible. . Take COVID-19 when you do interact with others. If you start feeling sick and think you may have COVID-19, get in touch with your healthcare provider within 24  hours.  Thank you for choosing [facility] at [facility]!!

## 2019-09-05 ENCOUNTER — Ambulatory Visit: Payer: Medicare Other | Admitting: General Practice

## 2019-09-06 ENCOUNTER — Encounter: Payer: Self-pay | Admitting: Internal Medicine

## 2019-09-06 ENCOUNTER — Ambulatory Visit (INDEPENDENT_AMBULATORY_CARE_PROVIDER_SITE_OTHER): Payer: Medicare Other

## 2019-09-06 DIAGNOSIS — I491 Atrial premature depolarization: Secondary | ICD-10-CM

## 2019-09-06 DIAGNOSIS — I499 Cardiac arrhythmia, unspecified: Secondary | ICD-10-CM | POA: Diagnosis not present

## 2019-09-09 ENCOUNTER — Telehealth: Payer: Self-pay | Admitting: Internal Medicine

## 2019-09-09 NOTE — Telephone Encounter (Signed)
Cardiology Moonlighter Note  Returned page from [employer]. Patient had first documented rhythm of A-flutter. Rate 150. Lasted for several hours. Also had several PVCs and brief runs of NSVT during this time.  Patient now back in sinus rhythm with rate of 86bpm.   Will notify ordering provider.   Marcie Mowers, MD Cardiology Fellow, PGY-7

## 2019-09-10 NOTE — Telephone Encounter (Signed)
Received strips- placed in MD's box that is needed for appointment, advised with Ivin Booty, RN as well.

## 2019-09-10 NOTE — Telephone Encounter (Signed)
Reviewed.  Adam Reid, can we get him follow up with me sooner than 3/29? We need to discuss need for colonoscopy prior to starting anticoagulation, and will likely have him seen by Afib clinic as well for review of options given that he is symptomatic.   Thanks, GA

## 2019-09-11 ENCOUNTER — Telehealth: Payer: Self-pay | Admitting: Nurse Practitioner

## 2019-09-11 DIAGNOSIS — Z01818 Encounter for other preprocedural examination: Secondary | ICD-10-CM

## 2019-09-11 DIAGNOSIS — D508 Other iron deficiency anemias: Secondary | ICD-10-CM

## 2019-09-11 NOTE — Telephone Encounter (Signed)
Per Dr Margaretann Loveless , okay for patient to have  Virtual  Visit--     RN spoke to wife - she states she would  prefer a Tuesday appt - schedule to 09/25/19 at 9:20 am  Wife states she is awaiting for GI to call the patient to schedule for colonoscopy.

## 2019-09-11 NOTE — Telephone Encounter (Signed)
Patient status update-please review previous message

## 2019-09-12 NOTE — Telephone Encounter (Signed)
Await recommendations from cardiologist regarding need for anticoagulation and if it is okay to proceed with EGD and colonoscopy.

## 2019-09-12 NOTE — Telephone Encounter (Signed)
Patient was recently found to have runs of trial fibrillation after a 2 week Holter monitor was done. Pt needs EGD/colonoscopy asap due to anemia and now he may require anticoagulation. I sent a message to Dr. Margaretann Loveless to verify if ok for patient to proceed with EGD/colonoscopy asap with new diagnosis of afib. ECHO showed diastolic CHF.  (patient was previously cleared to proceed with egd/colonoscopy prior to being diagnosed with afib).   I called the patient's wife, I informed her EGD and colonoscopy to be scheduled asap once I receive ok from  Dr. Margaretann Loveless and Dr. Silverio Decamp.   Dr. Silverio Decamp,  please review cardiac work up, let me know your input about proceeding with EGD/colonoscopy thx.

## 2019-09-12 NOTE — Telephone Encounter (Signed)
Msg received from cardiologist Dr. Margaretann Loveless regarding EGD and colonoscopy as follows:  Yes I think it would be safe to schedule, primarily bc he will likely qualify for anticoagulation for stroke prevention and I would be hesitant to start that without knowing why he has anemia. Let me know if there are concerns and I'm happy to chat further!  Gayatri  Bre, pls contact the patient's wife and schedule patient for EGD and colonosocpy with Dr. Silverio Decamp as soon as possible. thx

## 2019-09-12 NOTE — Telephone Encounter (Signed)
John, can you please review chart and let us know if he is ok for [place]? If not please schedule the procedures (EGD and colonoscopy) at [place] for week for March 8th. Thanks

## 2019-09-13 MED ORDER — NA SULFATE-K SULFATE-MG SULF 17.5-3.13-1.6 GM/177ML PO SOLN: 1.0000 | Freq: Once | ORAL | 0 refills | Status: AC

## 2019-09-13 NOTE — Telephone Encounter (Signed)
Ok, thanks.

## 2019-09-13 NOTE — Telephone Encounter (Signed)
Called and spoke with patient's wife-[name]-verified DPR-[name]  informed of MD recommendations; [name] is agreeable with plan of care and has scheduled the patient for a COVID screening on 09/26/2019 at 8:30 am and has been scheduled for EGD/colon on 09/27/2019 at 4:00 pm; [name]  verbalized understanding of information/instructions;  [name]  was advised to call the office at [phone] if questions/concerns arise;  Instructions have been sent to the patient via MyChart per [name]'s request and also mailed to the patient;

## 2019-09-13 NOTE — Telephone Encounter (Signed)
Dr. Nandigam,  This pt is cleared for anesthetic care at [facility].  Thanks,  Elowyn Raupp 

## 2019-09-14 ENCOUNTER — Other Ambulatory Visit: Payer: Self-pay

## 2019-09-14 ENCOUNTER — Ambulatory Visit (INDEPENDENT_AMBULATORY_CARE_PROVIDER_SITE_OTHER): Payer: Medicare Other | Admitting: Family Medicine

## 2019-09-14 ENCOUNTER — Ambulatory Visit (INDEPENDENT_AMBULATORY_CARE_PROVIDER_SITE_OTHER): Payer: Medicare Other

## 2019-09-14 ENCOUNTER — Encounter: Payer: Self-pay | Admitting: Family Medicine

## 2019-09-14 VITALS — BP 149/77 | HR 80 | Temp 98.2°F | Ht 66.0 in | Wt 137.0 lb

## 2019-09-14 DIAGNOSIS — F329 Major depressive disorder, single episode, unspecified: Secondary | ICD-10-CM

## 2019-09-14 DIAGNOSIS — I2510 Atherosclerotic heart disease of native coronary artery without angina pectoris: Secondary | ICD-10-CM | POA: Diagnosis not present

## 2019-09-14 DIAGNOSIS — R059 Cough, unspecified: Secondary | ICD-10-CM

## 2019-09-14 DIAGNOSIS — R0600 Dyspnea, unspecified: Secondary | ICD-10-CM | POA: Diagnosis not present

## 2019-09-14 DIAGNOSIS — D509 Iron deficiency anemia, unspecified: Secondary | ICD-10-CM

## 2019-09-14 DIAGNOSIS — R05 Cough: Secondary | ICD-10-CM

## 2019-09-14 DIAGNOSIS — I5031 Acute diastolic (congestive) heart failure: Secondary | ICD-10-CM

## 2019-09-14 DIAGNOSIS — R221 Localized swelling, mass and lump, neck: Secondary | ICD-10-CM

## 2019-09-14 DIAGNOSIS — E1122 Type 2 diabetes mellitus with diabetic chronic kidney disease: Secondary | ICD-10-CM

## 2019-09-14 DIAGNOSIS — F32A Depression, unspecified: Secondary | ICD-10-CM

## 2019-09-14 NOTE — Progress Notes (Signed)
Subjective:  Patient ID: Adam Reid, male    DOB: Jan 13, 1946  Age: 74 y.o. MRN: [id number]  CC:  Chief Complaint  Patient presents with  . Diabetes    since last visit pt's BS has been high 170-195 mg/dl has been the readings most days. pt checks a fasting BS everyday.  . Mass    R ear has a bump behind it. mass is not painful. it isn't growing. fist appearsed about 1 month ago. Mass apears to possibly be an absess of somekind, but pt states it feels hard.  . Depression    pt has a PQ-9 score of 12. (Pt's wife answered on his behave)  . Shortness of Breath    pt some times is short of breath pt's wife would like an x-ray if possible.   presents with wife - translating.   HPI Adam Reid presents for   Presents for multiple concerns as above.  Shortness of breath: Currently followed cardiology, last appointment February 23.  BNP for fourth was 734, echocardiogram February 5 EF 55 to 60%. Diastolic heart failure. Nonobstructive coronary disease with cath in 2017. Diuretic therapy was started feb  11th. Increased work of breathing thought to be due to anemia, diastolic heart failure, regular rhythm, possible amyloid and deconditioning post COVID-19 infection. Weight was up 4 pounds from her 16th. Continued 80 mg of Lasix, then decrease to 40 mg after 3 days. Plan for cardiac monitoring. Plan for colonoscopy and follow-up with cardiology after Zio patch has been returned. See previous visits re Anemia and follow-up with gastroenterology, also followed by nephrology. Prior IV iron.  Lab Results  Component Value Date   WBC 8.8 09/14/2019   HGB 9.1 (L) 09/14/2019   HCT 28.4 (L) 09/14/2019   MCV 82 09/14/2019   PLT 464 (H) 09/14/2019  colonoscopy 3/18. Has heart monitor - zio patch.  Still some shortness of breath - discussing with cardiology - appt next week.  Some cough with lying down - not new,  for months. No fever.  Shortness  Of breath overall improved with lasix.   No discolored sputum, no fevers.  Rare cough. covid 19 infection in December.  Wt Readings from Last 3 Encounters:  09/14/19 137 lb (62.1 kg)  09/04/19 136 lb 9.6 oz (62 kg)  08/28/19 140 lb 3.2 oz (63.6 kg)  lasix 30m qd.  Only on potassium with prior 3 days of 843mdose.   Bump behind R ear.  Past month - no change, no bleeding, no pus. No pain.  No prior eval. Firm are noted after cut self shaving.   Depression: On  Celexa 2017md.  Feeling more down at times recently. Associated with other health issues - not able to do as much as before.  No suicidal ideation.  Declines change in celexa at this time.   [facility] 09/14/2019 07/20/2019 07/04/2019 04/13/2019 12/19/2018  Decreased Interest 0 0 0 0 0  Down, Depressed, Hopeless 3 0 0 0 0  PHQ - 2 Score 3 0 0 0 0  Altered sleeping 2 - - - -  Tired, decreased energy 2 - - - -  Change in appetite 1 - - - -  Feeling bad or failure about yourself  0 - - - -  Trouble concentrating 1 - - - -  Moving slowly or fidgety/restless 3 - - - -  Suicidal thoughts 0 - - - -  PHQ-9 Score 12 - - - -  Difficult doing work/chores - - - - -    Diabetes: Complicated by chronic kidney disease. Home readings - 175 -200. No symptomatic lows. januvia 2m, actos 356m glipizide - 1066mnd 1/2 of 5 mg with breakfast. 12.5mg60mtal per day.    Lab Results  Component Value Date   HGBA1C 7.3 (A) 07/20/2019   HGBA1C 7.8 (A) 04/13/2019   HGBA1C 8.0 (A) 12/19/2018   Lab Results  Component Value Date   MICROALBUR 60.3 09/02/2015   LDLCALC 57 07/20/2019   CREATININE 2.25 (H) 09/14/2019      History Patient Active Problem List   Diagnosis Date Noted  . Secondary hyperparathyroidism, renal (HCC)[place]/20/2018  . Smoker 03/26/2016  . Hypotension 01/29/2016  . Abnormal myocardial perfusion study 01/29/2016  . Depression 12/09/2015  . Hyperlipidemia LDL goal <100 06/28/2014  . Diabetes mellitus with renal manifestations, controlled (HCC)[place] . HTN (hypertension) 06/24/2014  . 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 BPH (benign prostatic hyperplasia) 06/24/2014   Past Medical History:  Diagnosis Date  . Chronic kidney disease   . Diabetes mellitus without complication (HCC)[place]. Hyperlipidemia   . Hypertension   . Thyroid disease    was on supplement, taken off by Dr GuesElder Cyphersast Surgical History:  Procedure Laterality Date  . APPENDECTOMY    . CARDIAC CATHETERIZATION N/A 01/30/2016   Procedure: Left Heart Cath and Coronary Angiography;  Surgeon: JayaJettie Booze;  [facility];  Service: Cardiovascular;  Laterality: N/A;   No Known Allergies Prior to Admission medications   Medication Sig Start Date End Date Taking? Authorizing Provider  albuterol (VENTOLIN HFA) 108 (90 Base) MCG/ACT inhaler Inhale 2 puffs into the lungs every 6 (six) hours as needed for wheezing or shortness of breath. 07/02/19  Yes MorrMaximiano Coss  aspirin EC 81 MG EC tablet Take 1 tablet (81 mg total) by mouth daily. Patient taking differently: Take 81 mg by mouth every other day.  12/03/15  Yes Mayo, KatyPete Pelt  atorvastatin (LIPITOR) 80 MG tablet TAKE 1 TABLET(80 MG) BY MOUTH DAILY 07/20/19  Yes GreeWendie Agreste  blood glucose meter kit and supplies Use up to two times daily as directed  ICD10 E10.9 E11.9 02/20/19  Yes GreeWendie Agreste  Blood Glucose Monitoring Suppl (ONETOUCH VERIO) w/Device KIT USE UTD 09/03/15  Yes [provider]  Cholecalciferol (VITAMIN D-3 PO) Take 1 tablet by mouth See admin instructions. Three times a week. No specific days   Yes [provider]  citalopram (CELEXA) 20 MG tablet TAKE 1 TABLET(20 MG) BY MOUTH DAILY 07/20/19  Yes GreeWendie Agreste  cyanocobalamin 500 MCG tablet Take 500 mcg by mouth daily. Reported on 12/02/2015   Yes [provider]  furosemide (LASIX) 80 MG tablet Take 1 tablet (80 mg total) by  mouth daily. 08/26/19 11/24/19 Yes AchaElouise Munroe  glipiZIDE (GLUCOTROL XL) 10 MG 24 hr tablet TAKE 1 TABLET(10 MG) BY MOUTH DAILY WITH BREAKFAST Patient taking differently: 12.5 mg.  07/30/19  Yes GreeWendie Agreste  GlucoCom Lancets MISC Use for home glucose monitoring 09/02/15  Yes Jeffery, Chelle, PA  glucose blood (ONETOUCH VERIO) test strip Test up to 2 times per day.  Uncontrolled diabetes with hyperglycemia and stage 4 CKD. 04/13/19  Yes GreeWendie Agreste  metoprolol succinate (TOPROL-XL) 25 MG 24 hr tablet Take 1  tablet (25 mg total) by mouth daily. 08/10/19  Yes [name], MD  nitroGLYCERIN (NITROSTAT) 0.4 MG SL tablet Place 1 tablet (0.4 mg total) under the tongue every 5 (five) minutes x 3 doses as needed for chest pain. 01/31/16  Yes [name] E, NP  pioglitazone (ACTOS) 30 MG tablet TAKE 1 TABLET(30 MG) BY MOUTH DAILY 07/20/19  Yes [name], MD  polyethylene glycol (MIRALAX / GLYCOLAX) 17 g packet Take 17 g by mouth daily.   Yes [provider]  potassium chloride SA (KLOR-CON) 20 MEQ tablet Take 20 meq daily for 3 days only 08/29/19  Yes Cleaver, Jossie Ng, NP  senna (SENOKOT) 8.6 MG tablet Take 1 tablet by mouth as needed.    Yes [provider]  sitaGLIPtin (JANUVIA) 25 MG tablet Take 1 tablet (25 mg total) by mouth daily. 07/09/19  Yes [name], MD  tamsulosin (FLOMAX) 0.4 MG CAPS capsule TAKE 2 CAPSULES(0.8 MG) BY MOUTH DAILY 07/20/19  Yes [name], MD   Social History   Socioeconomic History  . Marital status: Married    Spouse name: [name]  . Number of children: 1  . Years of education: 12th grade  . Highest education level: Not on file  Occupational History  . Occupation: Retired-accountant  Tobacco Use  . Smoking status: Former Smoker    Packs/day: 0.25    Years: 48.00    Pack years: 12.00    Types: Cigarettes    Quit date: 07/16/2019    Years since quitting: 0.1  . Smokeless tobacco: Never Used  . Tobacco  comment: 09/07/16 3-4 cigs daily, trying to cut back  Substance and Sexual Activity  . Alcohol use: No    Alcohol/week: 0.0 standard drinks  . Drug use: No  . Sexual activity: Never    Partners: Female  Other Topics Concern  . Not on file  Social History Narrative   Originally from [place]. Came to the [place] in 2009, following their son who came here for school.   Married.   Lives with his wife.   Their adult son lives in [place], [place], where he is in optometry school.   Education: [employer]   Exercise: No   Social Determinants of Radio broadcast assistant Strain:   . Difficulty of Paying Living Expenses: Not on file  Food Insecurity:   . Worried About Charity fundraiser in the Last Year: Not on file  . Ran Out of Food in the Last Year: Not on file  Transportation Needs:   . Lack of Transportation (Medical): Not on file  . Lack of Transportation (Non-Medical): Not on file  Physical Activity:   . Days of Exercise per Week: Not on file  . Minutes of Exercise per Session: Not on file  Stress:   . Feeling of Stress : Not on file  Social Connections:   . Frequency of Communication with Friends and Family: Not on file  . Frequency of Social Gatherings with Friends and Family: Not on file  . Attends Religious Services: Not on file  . Active Member of Clubs or Organizations: Not on file  . Attends Archivist Meetings: Not on file  . Marital Status: Not on file  Intimate Partner Violence:   . Fear of Current or Ex-Partner: Not on file  . Emotionally Abused: Not on file  . Physically Abused: Not on file  . Sexually Abused: Not on file    Review of  Systems  Per HPI Objective:   Vitals:   09/14/19 1221  BP: (!) 149/77  Pulse: 80  Temp: 98.2 F (36.8 C)  TempSrc: Temporal  SpO2: 92%  Weight: 137 lb (62.1 kg)  Height: _0  (1.676 m)     Physical Exam Vitals reviewed.  Constitutional:      Appearance: He is well-developed.  HENT:     Head: Normocephalic  and atraumatic.   Eyes:     Pupils: Pupils are equal, round, and reactive to light.  Neck:     Vascular: No carotid bruit or JVD.  Cardiovascular:     Rate and Rhythm: Normal rate and regular rhythm.     Heart sounds: Normal heart sounds. No murmur.  Pulmonary:     Effort: Pulmonary effort is normal. No respiratory distress.     Breath sounds: Normal breath sounds. No rales.     Comments: Few coarse bs RLL.  Musculoskeletal:     Right lower leg: Edema (2+ edema mid tibia. ) present.     Left lower leg: Edema present.  Skin:    General: Skin is warm and dry.  Neurological:     Mental Status: He is alert and oriented to person, place, and time.    Results for orders placed or performed in visit on 09/14/19  CBC  Result Value Ref Range   WBC 8.8 3.4 - 10.8 x10E3/uL   RBC 3.47 (L) 4.14 - 5.80 x10E6/uL   Hemoglobin 9.1 (L) 13.0 - 17.7 g/dL   Hematocrit 28.4 (L) 37.5 - 51.0 %   MCV 82 79 - 97 fL   MCH 26.2 (L) 26.6 - 33.0 pg   MCHC 32.0 31.5 - 35.7 g/dL   RDW 16.4 (H) 11.6 - 15.4 %   Platelets 464 (H) 150 - 450 x10E3/uL  Fructosamine  Result Value Ref Range   Fructosamine 314 (H) 0 - 285 umol/L  Basic metabolic panel  Result Value Ref Range   Glucose 193 (H) 65 - 99 mg/dL   BUN 51 (H) 8 - 27 mg/dL   Creatinine, Ser 2.25 (H) 0.76 - 1.27 mg/dL   GFR calc non Af Amer 28 (L) >59 mL/min/1.73   GFR calc Af Amer 32 (L) >59 mL/min/1.73   BUN/Creatinine Ratio 23 10 - 24   Sodium 138 134 - 144 mmol/L   Potassium 3.7 3.5 - 5.2 mmol/L   Chloride 97 96 - 106 mmol/L   CO2 22 20 - 29 mmol/L   Calcium 9.2 8.6 - 10.2 mg/dL  DG Chest 2 View  Result Date: 09/14/2019 CLINICAL DATA:  Cough with dyspnea and coarse bowel sounds at the right lung base. EXAM: CHEST - 2 VIEW COMPARISON:  Radiographs 01/29/2016. FINDINGS: PA view was repeated. Suboptimal inspiration. Heart monitor bracket is noted anterior to the upper sternum which is reported to be not movable. The heart size and mediastinal  contours are stable. There are new moderate-sized pleural effusions with bibasilar pulmonary opacities and slightly decreased definition of the pulmonary vasculature. No pneumothorax. The bones appear unchanged. IMPRESSION: New moderate-sized bilateral pleural effusions, vascular congestion and probable bibasilar atelectasis, most consistent with congestive heart failure. Infection not excluded. Electronically Signed   By: Richardean Sale M.D.   On: 09/14/2019 14:09     Assessment & Plan:  Zyad Boomer is a 74 y.o. male . Cough - Plan: DG Chest 2 View Dyspnea, unspecified type - Plan: DG Chest 2 View Acute diastolic heart failure ([place]) - Plan: Basic metabolic  panel  -Followed by cardiology with CHF.  Some persistent cough, dyspnea but reports overall improvement since starting Lasix.  Chest x-ray noted with pleural effusions, vascular congestion and probable atelectasis, this is most likely CHF.  Does have some signs of fluid overload with lower extremity edema.  Afebrile, doubt infection.  -Plan to increase Lasix for the next 3 days to '80mg'$ , restart potassium, monitor weight, recheck electrolytes in the next 3 days. Plan on OV this Monday.  May need cardiology eval sooner than planned appt. .  ER precautions discussed with any increased dyspnea.   Iron deficiency anemia, unspecified iron deficiency anemia type - Plan: CBC  -Continue follow-up with gastroenterology as planned.  Hemoglobin has improved.  Depression, unspecified depression type  -Declined change in medications.  Suspect some component of adjustment disorder with his current illness.  Consider counseling.  Declined any med changes or resources at this time.  Type 2 diabetes mellitus with chronic kidney disease, without long-term current use of insulin, unspecified CKD stage ([place]) - Plan: Fructosamine  -Some elevated readings, elevated fructosamine.  With his CKD, will need to be cautious with medication.  Asked that they check  fasting and 2-hour postprandials with follow-up next week to adjust regimen.  Neck mass - Plan: US Soft Tissue Head/Neck (NON-THYROID), Ambulatory referral to ENT  -Reports onset after wound, no signs of infection seen at this time.  Differential includes cystic versus solid structure/mass.  Initially start with ultrasound but also referred to ENT.  RTC precautions.   No orders of the defined types were placed in this encounter.  Patient Instructions    I will check another diabetes test today, but please return in 1 week with home blood sugar readings fasting, and 2 hours after breakfast lunch or dinner.  Try to check that twice per day so we can adjust the medications to those readings next visit.  I will order an ultrasound only bump on the neck, but will also refer you to ear nose and throat.  Continue diuretics for leg swelling, but adjustments in that dose should be discussed with cardiology as well as your kidney specialist.  If any concerns on x-ray will let you know  We certainly can discuss changes in citalopram dose if you are feeling more depressed, but I think some of the mood symptoms recently are with the changes in your health.  I am also happy to refer you to a therapist to discuss this if you would like.  Please let me know.   If you have lab work done today you will be contacted with your lab results within the next 2 weeks.  If you have not heard from [place] then please contact us. The fastest way to get your results is to register for My Chart.   IF you received an x-ray today, you will receive an invoice from [facility]. Please contact [facility] at [phone] with questions or concerns regarding your invoice.   IF you received labwork today, you will receive an invoice from [facility]. Please contact [facility] at [phone] with questions or concerns regarding your invoice.   Our billing staff will not be able to assist you with questions regarding  bills from these companies.  You will be contacted with the lab results as soon as they are available. The fastest way to get your results is to activate your My Chart account. Instructions are located on the last page of this paperwork. If you have not heard from [place] regarding  the results in 2 weeks, please contact this office.    I will check fructosamine,     Signed, Merri Ray, MD [facility]

## 2019-09-14 NOTE — Patient Instructions (Addendum)
  I will check another diabetes test today, but please return in 1 week with home blood sugar readings fasting, and 2 hours after breakfast lunch or dinner.  Try to check that twice per day so we can adjust the medications to those readings next visit.  I will order an ultrasound only bump on the neck, but will also refer you to ear nose and throat.  Continue diuretics for leg swelling, but adjustments in that dose should be discussed with cardiology as well as your kidney specialist.  If any concerns on x-ray will let you know  We certainly can discuss changes in citalopram dose if you are feeling more depressed, but I think some of the mood symptoms recently are with the changes in your health.  I am also happy to refer you to a therapist to discuss this if you would like.  Please let me know.   If you have lab work done today you will be contacted with your lab results within the next 2 weeks.  If you have not heard from [place] then please contact us. The fastest way to get your results is to register for My Chart.   IF you received an x-ray today, you will receive an invoice from [facility]. Please contact [facility] at [phone] with questions or concerns regarding your invoice.   IF you received labwork today, you will receive an invoice from [facility]. Please contact [facility] at [phone] with questions or concerns regarding your invoice.   Our billing staff will not be able to assist you with questions regarding bills from these companies.  You will be contacted with the lab results as soon as they are available. The fastest way to get your results is to activate your My Chart account. Instructions are located on the last page of this paperwork. If you have not heard from [place] regarding the results in 2 weeks, please contact this office.

## 2019-09-15 ENCOUNTER — Encounter: Payer: Self-pay | Admitting: Family Medicine

## 2019-09-15 LAB — CBC
Hematocrit: 28.4 % — ABNORMAL LOW (ref 37.5–51.0)
Hemoglobin: 9.1 g/dL — ABNORMAL LOW (ref 13.0–17.7)
MCH: 26.2 pg — ABNORMAL LOW (ref 26.6–33.0)
MCHC: 32 g/dL (ref 31.5–35.7)
MCV: 82 fL (ref 79–97)
Platelets: 464 10*3/uL — ABNORMAL HIGH (ref 150–450)
RBC: 3.47 x10E6/uL — ABNORMAL LOW (ref 4.14–5.80)
RDW: 16.4 % — ABNORMAL HIGH (ref 11.6–15.4)
WBC: 8.8 10*3/uL (ref 3.4–10.8)

## 2019-09-15 LAB — BASIC METABOLIC PANEL
BUN/Creatinine Ratio: 23 (ref 10–24)
BUN: 51 mg/dL — ABNORMAL HIGH (ref 8–27)
CO2: 22 mmol/L (ref 20–29)
Calcium: 9.2 mg/dL (ref 8.6–10.2)
Chloride: 97 mmol/L (ref 96–106)
Creatinine, Ser: 2.25 mg/dL — ABNORMAL HIGH (ref 0.76–1.27)
GFR calc Af Amer: 32 mL/min/{1.73_m2} — ABNORMAL LOW (ref >59)
GFR calc non Af Amer: 28 mL/min/{1.73_m2} — ABNORMAL LOW (ref >59)
Glucose: 193 mg/dL — ABNORMAL HIGH (ref 65–99)
Potassium: 3.7 mmol/L (ref 3.5–5.2)
Sodium: 138 mmol/L (ref 134–144)

## 2019-09-15 LAB — FRUCTOSAMINE: Fructosamine: 314 umol/L — ABNORMAL HIGH (ref 0–285)

## 2019-09-15 MED ORDER — POTASSIUM CHLORIDE CRYS ER 20 MEQ PO TBCR: EXTENDED_RELEASE_TABLET | ORAL | 0 refills | Status: DC

## 2019-09-17 ENCOUNTER — Telehealth: Payer: Self-pay | Admitting: *Deleted

## 2019-09-17 ENCOUNTER — Other Ambulatory Visit: Payer: Self-pay

## 2019-09-17 ENCOUNTER — Ambulatory Visit (INDEPENDENT_AMBULATORY_CARE_PROVIDER_SITE_OTHER): Payer: Medicare Other

## 2019-09-17 ENCOUNTER — Ambulatory Visit (INDEPENDENT_AMBULATORY_CARE_PROVIDER_SITE_OTHER): Payer: Medicare Other | Admitting: Family Medicine

## 2019-09-17 VITALS — BP 116/80 | HR 82 | Temp 98.2°F | Ht 66.0 in | Wt 136.0 lb

## 2019-09-17 DIAGNOSIS — I509 Heart failure, unspecified: Secondary | ICD-10-CM

## 2019-09-17 DIAGNOSIS — R0600 Dyspnea, unspecified: Secondary | ICD-10-CM | POA: Diagnosis not present

## 2019-09-17 DIAGNOSIS — R600 Localized edema: Secondary | ICD-10-CM

## 2019-09-17 DIAGNOSIS — I2510 Atherosclerotic heart disease of native coronary artery without angina pectoris: Secondary | ICD-10-CM | POA: Diagnosis not present

## 2019-09-17 DIAGNOSIS — I499 Cardiac arrhythmia, unspecified: Secondary | ICD-10-CM | POA: Diagnosis not present

## 2019-09-17 DIAGNOSIS — J90 Pleural effusion, not elsewhere classified: Secondary | ICD-10-CM

## 2019-09-17 DIAGNOSIS — N184 Chronic kidney disease, stage 4 (severe): Secondary | ICD-10-CM

## 2019-09-17 NOTE — Telephone Encounter (Signed)
Alerts from [employer]:  On 3/4 the patient had a 5 beat run of V-Tach On 3/5 the patient had a 7 beat run of V-Tach On 3/7 the patient had a 5 beat run of V-Tach and atrial flutter  The patient has had previous alerts of atrial flutter.  Spoke with the patient's wife. He was currently in the shower and she stated that he felt fine.   The patient has an appointment today with his PCP at 4 pm. The patient currently has an appointment with Dr. Margaretann Loveless on 3/16.   Per Dr. [place] (DOD0, no changes at this time. Strips placed in Dr. Delphina Cahill box for review.

## 2019-09-17 NOTE — Progress Notes (Signed)
Subjective:  Patient ID: Adam Reid, male    DOB: Sep 04, 1945  Age: 74 y.o. MRN: [id number]  CC:  Chief Complaint  Patient presents with  . Follow-up    on Shortness of breath and edima. pt's Both feet swoolen , but the swelling has gone down since last vist. no pain in pts feet. pt is short of breath when active and when laying down. pt feels good while seated.  pt still has bump behind L ear.   Spouse translating, understanding of plan expressed.  HPI [name] presents for   Congestive heart failure Follow-up from visit 3 days ago.  Normal structural cardiac disease with cath in 2017.   Elevated BNP of 734 on February 4th. EchoFebruary 5 with EF 55 to 16%, diastolic heart failure. Started on diuretic therapy February 11.   treated with 40 mg of Lasix, with episodic increase to 80 mg for 3 days. Was taking 40 mg daily at his visit with me on Friday.  Noted to have significant pedal edema at last visit, hemoglobin stable and actually improved from previous reading.  chest x-ray with bilateral pleural effusions with CHF.  Subjectively had some decreased shortness of breath with diuretic therapy.  Results discussed with patient, spouse 2 days ago, denied new cough or productive cough.  No fevers. Some orthopnea. Unlikely infectious cause.  I increased his Lasix from 40 to 80 mg/day for 3 days with addition of 20 mEq of potassium.  Next cardiology appointment scheduled March 16. Telephone note today reviewed, alert from his cardiac monitor.  On March 4 fifth and seventh he had a 5 beat run, 7 beat run, then 5 beat run of V. tach and atrial flutter.  No changes from cardiology. Wt Readings from Last 3 Encounters:  09/17/19 136 lb (61.7 kg)  09/14/19 137 lb (62.1 kg)  09/04/19 136 lb 9.6 oz (62 kg)   Since last visit - short of breath with exertion, but better than 3 days ago. Less orthopnea. Sleeping on extra pillow - 3 now. S/p 3 doses of '80mg'$  lasix, potassium yesterday, not yet  today.  No chest pains, no near syncope/dizziness. Sometimes feels palpitations- when short of breath only.  Home weights: 138 yesterday, 137 today.    DG Chest 2 View  Result Date: 09/17/2019 CLINICAL DATA:  CHF, recheck pleural effusions EXAM: CHEST - 2 VIEW COMPARISON:  Chest radiograph 09/14/2019 FINDINGS: Stable cardiomediastinal contours. Vascular congestion. Mild bilateral interstitial opacities. Scattered atelectasis. Stable to slightly decreased moderate bilateral effusions. No pneumothorax. No acute finding in the visualized skeleton. IMPRESSION: Stable to slightly decreased moderate bilateral effusions. Mild edema and scattered atelectasis. Electronically Signed   By: Audie Pinto M.D.   On: 09/17/2019 17:30   DG Chest 2 View  Result Date: 09/14/2019 CLINICAL DATA:  Cough with dyspnea and coarse bowel sounds at the right lung base. EXAM: CHEST - 2 VIEW COMPARISON:  Radiographs 01/29/2016. FINDINGS: PA view was repeated. Suboptimal inspiration. Heart monitor bracket is noted anterior to the upper sternum which is reported to be not movable. The heart size and mediastinal contours are stable. There are new moderate-sized pleural effusions with bibasilar pulmonary opacities and slightly decreased definition of the pulmonary vasculature. No pneumothorax. The bones appear unchanged. IMPRESSION: New moderate-sized bilateral pleural effusions, vascular congestion and probable bibasilar atelectasis, most consistent with congestive heart failure. Infection not excluded. Electronically Signed   By: Richardean Sale M.D.   On: 09/14/2019 14:09       .  History Patient Active Problem List   Diagnosis Date Noted  . Secondary hyperparathyroidism, renal ([place]) 12/29/2016  . Smoker 03/26/2016  . Hypotension 01/29/2016  . Abnormal myocardial perfusion study 01/29/2016  . Depression 12/09/2015  . Hyperlipidemia LDL goal <100 06/28/2014  . Diabetes mellitus with renal manifestations, controlled  ([place])   . HTN (hypertension) 06/24/2014  . 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 BPH (benign prostatic hyperplasia) 06/24/2014   Past Medical History:  Diagnosis Date  . Chronic kidney disease   . Diabetes mellitus without complication ([place])   . Hyperlipidemia   . Hypertension   . Thyroid disease    was on supplement, taken off by [name]   Past Surgical History:  Procedure Laterality Date  . APPENDECTOMY    . CARDIAC CATHETERIZATION N/A 01/30/2016   Procedure: Left Heart Cath and Coronary Angiography;  Surgeon: [name], MD;  Location: [facility];  Service: Cardiovascular;  Laterality: N/A;   No Known Allergies Prior to Admission medications   Medication Sig Start Date End Date Taking? Authorizing Provider  albuterol (VENTOLIN HFA) 108 (90 Base) MCG/ACT inhaler Inhale 2 puffs into the lungs every 6 (six) hours as needed for wheezing or shortness of breath. 07/02/19  Yes Maximiano Coss, NP  aspirin EC 81 MG EC tablet Take 1 tablet (81 mg total) by mouth daily. Patient taking differently: Take 81 mg by mouth every other day.  12/03/15  Yes Mayo, Pete Pelt, MD  atorvastatin (LIPITOR) 80 MG tablet TAKE 1 TABLET(80 MG) BY MOUTH DAILY 07/20/19  Yes [name], MD  blood glucose meter kit and supplies Use up to two times daily as directed  ICD10 E10.9 E11.9 02/20/19  Yes [name], MD  Blood Glucose Monitoring Suppl (ONETOUCH VERIO) w/Device KIT USE UTD 09/03/15  Yes [provider]  Cholecalciferol (VITAMIN D-3 PO) Take 1 tablet by mouth See admin instructions. Three times a week. No specific days   Yes [provider]  citalopram (CELEXA) 20 MG tablet TAKE 1 TABLET(20 MG) BY MOUTH DAILY 07/20/19  Yes [name], MD  cyanocobalamin 500 MCG tablet Take 500 mcg by mouth daily. Reported on 12/02/2015   Yes [provider]  furosemide (LASIX) 80 MG tablet Take 1 tablet (80 mg  total) by mouth daily. 08/26/19 11/24/19 Yes Elouise Munroe, MD  glipiZIDE (GLUCOTROL XL) 10 MG 24 hr tablet TAKE 1 TABLET(10 MG) BY MOUTH DAILY WITH BREAKFAST Patient taking differently: 12.5 mg.  07/30/19  Yes [name], MD  GlucoCom Lancets MISC Use for home glucose monitoring 09/02/15  Yes Jeffery, Chelle, PA  glucose blood (ONETOUCH VERIO) test strip Test up to 2 times per day.  Uncontrolled diabetes with hyperglycemia and stage 4 CKD. 04/13/19  Yes [name], MD  metoprolol succinate (TOPROL-XL) 25 MG 24 hr tablet Take 1 tablet (25 mg total) by mouth daily. 08/10/19  Yes [name], MD  nitroGLYCERIN (NITROSTAT) 0.4 MG SL tablet Place 1 tablet (0.4 mg total) under the tongue every 5 (five) minutes x 3 doses as needed for chest pain. 01/31/16  Yes [name] E, NP  pioglitazone (ACTOS) 30 MG tablet TAKE 1 TABLET(30 MG) BY MOUTH DAILY 07/20/19  Yes [name], MD  polyethylene glycol (MIRALAX / GLYCOLAX) 17 g packet Take 17 g by mouth daily.   Yes [provider]  potassium chloride SA (KLOR-CON) 20 MEQ tablet Take  20 meq daily for 3 days only 09/15/19  Yes [name], MD  senna (SENOKOT) 8.6 MG tablet Take 1 tablet by mouth as needed.    Yes [provider]  sitaGLIPtin (JANUVIA) 25 MG tablet Take 1 tablet (25 mg total) by mouth daily. 07/09/19  Yes [name], MD  tamsulosin (FLOMAX) 0.4 MG CAPS capsule TAKE 2 CAPSULES(0.8 MG) BY MOUTH DAILY 07/20/19  Yes [name], MD   Social History   Socioeconomic History  . Marital status: Married    Spouse name: [name]  . Number of children: 1  . Years of education: 12th grade  . Highest education level: Not on file  Occupational History  . Occupation: Retired-accountant  Tobacco Use  . Smoking status: Former Smoker    Packs/day: 0.25    Years: 48.00    Pack years: 12.00    Types: Cigarettes    Quit date: 07/16/2019    Years since quitting: 0.1  . Smokeless tobacco: Never Used  .  Tobacco comment: 09/07/16 3-4 cigs daily, trying to cut back  Substance and Sexual Activity  . Alcohol use: No    Alcohol/week: 0.0 standard drinks  . Drug use: No  . Sexual activity: Never    Partners: Female  Other Topics Concern  . Not on file  Social History Narrative   Originally from [place]. Came to the [place] in 2009, following their son who came here for school.   Married.   Lives with his wife.   Their adult son lives in [place], [place], where he is in optometry school.   Education: [employer]   Exercise: No   Social Determinants of Radio broadcast assistant Strain:   . Difficulty of Paying Living Expenses: Not on file  Food Insecurity:   . Worried About Charity fundraiser in the Last Year: Not on file  . Ran Out of Food in the Last Year: Not on file  Transportation Needs:   . Lack of Transportation (Medical): Not on file  . Lack of Transportation (Non-Medical): Not on file  Physical Activity:   . Days of Exercise per Week: Not on file  . Minutes of Exercise per Session: Not on file  Stress:   . Feeling of Stress : Not on file  Social Connections:   . Frequency of Communication with Friends and Family: Not on file  . Frequency of Social Gatherings with Friends and Family: Not on file  . Attends Religious Services: Not on file  . Active Member of Clubs or Organizations: Not on file  . Attends Archivist Meetings: Not on file  . Marital Status: Not on file  Intimate Partner Violence:   . Fear of Current or Ex-Partner: Not on file  . Emotionally Abused: Not on file  . Physically Abused: Not on file  . Sexually Abused: Not on file    Review of Systems Per HPI.   Objective:   Vitals:   09/17/19 1710  BP: 116/80  Pulse: 82  Temp: 98.2 F (36.8 C)  TempSrc: Temporal  SpO2: 96%  Weight: 136 lb (61.7 kg)  Height: '5\' 6"'$  (1.676 m)     Physical Exam Vitals reviewed.  Constitutional:      Appearance: He is well-developed.  HENT:     Head:  Normocephalic and atraumatic.  Eyes:     Pupils: Pupils are equal, round, and reactive to light.  Neck:     Vascular: No carotid bruit or  JVD.  Cardiovascular:     Rate and Rhythm: Normal rate. Rhythm irregular.     Pulses: Normal pulses.     Heart sounds: Normal heart sounds. No murmur.     Comments: re overall regular rate, episodic irregular irregular rate, not persistent Pulmonary:     Effort: Pulmonary effort is normal. No respiratory distress.     Breath sounds: No wheezing or rales.     Comments: Distant bs, but no appreciable rales/rhonchi. No distress.    Musculoskeletal:     Right lower leg: Edema (1+ to mid tibia bialterally, calves nt. ) present.     Left lower leg: Edema present.  Skin:    General: Skin is warm and dry.  Neurological:     Mental Status: He is alert and oriented to person, place, and time.    DG Chest 2 View  Result Date: 09/17/2019 CLINICAL DATA:  CHF, recheck pleural effusions EXAM: CHEST - 2 VIEW COMPARISON:  Chest radiograph 09/14/2019 FINDINGS: Stable cardiomediastinal contours. Vascular congestion. Mild bilateral interstitial opacities. Scattered atelectasis. Stable to slightly decreased moderate bilateral effusions. No pneumothorax. No acute finding in the visualized skeleton. IMPRESSION: Stable to slightly decreased moderate bilateral effusions. Mild edema and scattered atelectasis. Electronically Signed   By: Audie Pinto M.D.   On: 09/17/2019 17:30   EKG:  SR, multiple PAC's.   Assessment & Plan:  Neko Boyajian is a 74 y.o. male . Congestive heart failure, unspecified HF chronicity, unspecified heart failure type ([facility]) - Plan: Pro b natriuretic peptide, DG Chest 2 View  Pleural effusion - Plan: Pro b natriuretic peptide, DG Chest 2 View  Pedal edema - Plan: Basic metabolic panel  Chronic kidney disease (CKD), stage IV (severe) ([facility]) - Plan: Basic metabolic panel  Dyspnea, unspecified type - Plan: Pro b natriuretic peptide, DG Chest  2 View  Irregular heart rhythm - Plan: EKG 12-Lead  Wounds are improved on both exam and chest x-ray, CHF improving with current furosemide dose at 80 mg.  Has 1 more day of higher dose then will revert back to 40 mg daily.  Close monitoring for increasing swelling.  Check bnp.  EKG with PACs.  Phone note reviewed with home monitoring indicating possible few episodes of V. tach but not seen on in office testing, as well as a flutter.  Close cardiology follow-up planned.  ER/RTC precautions given.  No orders of the defined types were placed in this encounter.  Patient Instructions    I will check some other tests - follow up with cardiology as planned, but I will call them to see if visit needed sooner. Can return to '40mg'$  furosemide tomorrow.   Return to the clinic or go to the nearest emergency room if any of your symptoms worsen or new symptoms occur.    If you have lab work done today you will be contacted with your lab results within the next 2 weeks.  If you have not heard from [place] then please contact us. The fastest way to get your results is to register for My Chart.   IF you received an x-ray today, you will receive an invoice from [facility]. Please contact [facility] at [phone] with questions or concerns regarding your invoice.   IF you received labwork today, you will receive an invoice from [facility]. Please contact [facility] at [phone] with questions or concerns regarding your invoice.   Our billing staff will not be able to assist you with questions regarding bills from these  companies.  You will be contacted with the lab results as soon as they are available. The fastest way to get your results is to activate your My Chart account. Instructions are located on the last page of this paperwork. If you have not heard from [place] regarding the results in 2 weeks, please contact this office.         Signed, Merri Ray, MD [facility]

## 2019-09-17 NOTE — Patient Instructions (Addendum)
  I will check some other tests - follow up with cardiology as planned, but I will call them to see if visit needed sooner. Can return to 40mg  furosemide tomorrow.   Return to the clinic or go to the nearest emergency room if any of your symptoms worsen or new symptoms occur.    If you have lab work done today you will be contacted with your lab results within the next 2 weeks.  If you have not heard from [place] then please contact us. The fastest way to get your results is to register for My Chart.   IF you received an x-ray today, you will receive an invoice from [facility]. Please contact [facility] at [phone] with questions or concerns regarding your invoice.   IF you received labwork today, you will receive an invoice from [facility]. Please contact [facility] at [phone] with questions or concerns regarding your invoice.   Our billing staff will not be able to assist you with questions regarding bills from these companies.  You will be contacted with the lab results as soon as they are available. The fastest way to get your results is to activate your My Chart account. Instructions are located on the last page of this paperwork. If you have not heard from [place] regarding the results in 2 weeks, please contact this office.

## 2019-09-18 ENCOUNTER — Encounter: Payer: Self-pay | Admitting: Family Medicine

## 2019-09-18 LAB — PRO B NATRIURETIC PEPTIDE: NT-Pro BNP: 17017 pg/mL — ABNORMAL HIGH (ref 0–376)

## 2019-09-18 LAB — BASIC METABOLIC PANEL
BUN/Creatinine Ratio: 22 (ref 10–24)
BUN: 50 mg/dL — ABNORMAL HIGH (ref 8–27)
CO2: 23 mmol/L (ref 20–29)
Calcium: 9.7 mg/dL (ref 8.6–10.2)
Chloride: 96 mmol/L (ref 96–106)
Creatinine, Ser: 2.24 mg/dL — ABNORMAL HIGH (ref 0.76–1.27)
GFR calc Af Amer: 33 mL/min/{1.73_m2} — ABNORMAL LOW (ref >59)
GFR calc non Af Amer: 28 mL/min/{1.73_m2} — ABNORMAL LOW (ref >59)
Glucose: 91 mg/dL (ref 65–99)
Potassium: 3.6 mmol/L (ref 3.5–5.2)
Sodium: 139 mmol/L (ref 134–144)

## 2019-09-19 ENCOUNTER — Telehealth: Payer: Self-pay

## 2019-09-19 NOTE — Telephone Encounter (Signed)
Spoke with Dr. Margaretann Loveless and per MD, schedule pt on 09/24/19 at 4pm in [place]. And she will review his monitor and discuss need for EP evaluation in their office visit.  Called and spoke with pt's wife notified of Dr.Acharya's advice, wife weary of waiting until Monday for her husband to be seen. Notified that this was Dr.Acharya's first available OV and that if Dr. Margaretann Loveless was comfortable with them being seen on Monday then pt should be fine. Wife also stated that she was told the monitor had to be returned 3/11. Notified that this should be fine. (Pt is on a 14 day monitor) Wife verbalized understanding and was thankful for the follow up call. Stated that she understood that the office visit with Dr. Margaretann Loveless was scheduled for Monday March 15th at 4pm . Notified wife that if pt had any active chest pain to call 911 and be evaluated in the ED. Wife verbalized understanding. No other questions or concerns at this time.

## 2019-09-19 NOTE — Telephone Encounter (Signed)
Received a "serious event" from [employer]-- "Sinus Arrhythmia, SVT (39 sec) w/PVCs (1in 1 min)" RECEIVED 3/9/21at 0238 am  Called and spoke with pt's wife per DPR- wife states that last night he was sleeping with 3 pillows under his head and then had to sleep sitting up because he was having some SOB. Wife state the event woke him up. She tried to tell him that if he was feeling bad that they should go to the hospital but he said that he was fine.  Wife states that he is okay now but she feels like he needs an appointment to be seen soon.  Will notify Dr.Acharya for review and advice. Wife verbalized understanding

## 2019-09-21 ENCOUNTER — Ambulatory Visit: Payer: Medicare Other | Admitting: Family Medicine

## 2019-09-23 ENCOUNTER — Other Ambulatory Visit: Payer: Self-pay

## 2019-09-23 ENCOUNTER — Emergency Department (HOSPITAL_COMMUNITY): Payer: Medicare Other

## 2019-09-23 ENCOUNTER — Encounter (HOSPITAL_COMMUNITY): Payer: Self-pay | Admitting: *Deleted

## 2019-09-23 ENCOUNTER — Inpatient Hospital Stay (HOSPITAL_COMMUNITY)
Admission: EM | Admit: 2019-09-23 | Discharge: 2019-09-26 | DRG: 291 | Disposition: A | Discharge status: Home / Self Care | Payer: Medicare Other | Attending: Internal Medicine | Admitting: Internal Medicine

## 2019-09-23 DIAGNOSIS — Z7984 Long term (current) use of oral hypoglycemic drugs: Secondary | ICD-10-CM

## 2019-09-23 DIAGNOSIS — Z8249 Family history of ischemic heart disease and other diseases of the circulatory system: Secondary | ICD-10-CM

## 2019-09-23 DIAGNOSIS — Z8349 Family history of other endocrine, nutritional and metabolic diseases: Secondary | ICD-10-CM

## 2019-09-23 DIAGNOSIS — D631 Anemia in chronic kidney disease: Secondary | ICD-10-CM | POA: Diagnosis present

## 2019-09-23 DIAGNOSIS — N400 Enlarged prostate without lower urinary tract symptoms: Secondary | ICD-10-CM | POA: Diagnosis present

## 2019-09-23 DIAGNOSIS — E1122 Type 2 diabetes mellitus with diabetic chronic kidney disease: Secondary | ICD-10-CM

## 2019-09-23 DIAGNOSIS — I130 Hypertensive heart and chronic kidney disease with heart failure and stage 1 through stage 4 chronic kidney disease, or unspecified chronic kidney disease: Principal | ICD-10-CM | POA: Diagnosis present

## 2019-09-23 DIAGNOSIS — N2581 Secondary hyperparathyroidism of renal origin: Secondary | ICD-10-CM | POA: Diagnosis present

## 2019-09-23 DIAGNOSIS — N184 Chronic kidney disease, stage 4 (severe): Secondary | ICD-10-CM | POA: Diagnosis present

## 2019-09-23 DIAGNOSIS — I959 Hypotension, unspecified: Secondary | ICD-10-CM | POA: Diagnosis present

## 2019-09-23 DIAGNOSIS — D649 Anemia, unspecified: Secondary | ICD-10-CM | POA: Diagnosis present

## 2019-09-23 DIAGNOSIS — N179 Acute kidney failure, unspecified: Secondary | ICD-10-CM | POA: Diagnosis present

## 2019-09-23 DIAGNOSIS — E1169 Type 2 diabetes mellitus with other specified complication: Secondary | ICD-10-CM | POA: Diagnosis present

## 2019-09-23 DIAGNOSIS — E876 Hypokalemia: Secondary | ICD-10-CM | POA: Diagnosis not present

## 2019-09-23 DIAGNOSIS — E785 Hyperlipidemia, unspecified: Secondary | ICD-10-CM | POA: Diagnosis present

## 2019-09-23 DIAGNOSIS — Z8616 Personal history of COVID-19: Secondary | ICD-10-CM

## 2019-09-23 DIAGNOSIS — I5043 Acute on chronic combined systolic (congestive) and diastolic (congestive) heart failure: Secondary | ICD-10-CM | POA: Diagnosis present

## 2019-09-23 DIAGNOSIS — I480 Paroxysmal atrial fibrillation: Secondary | ICD-10-CM | POA: Diagnosis present

## 2019-09-23 DIAGNOSIS — Z7982 Long term (current) use of aspirin: Secondary | ICD-10-CM

## 2019-09-23 DIAGNOSIS — R0602 Shortness of breath: Secondary | ICD-10-CM

## 2019-09-23 DIAGNOSIS — Z79899 Other long term (current) drug therapy: Secondary | ICD-10-CM

## 2019-09-23 DIAGNOSIS — J9601 Acute respiratory failure with hypoxia: Secondary | ICD-10-CM

## 2019-09-23 DIAGNOSIS — F329 Major depressive disorder, single episode, unspecified: Secondary | ICD-10-CM | POA: Diagnosis present

## 2019-09-23 DIAGNOSIS — D638 Anemia in other chronic diseases classified elsewhere: Secondary | ICD-10-CM | POA: Diagnosis present

## 2019-09-23 DIAGNOSIS — Z794 Long term (current) use of insulin: Secondary | ICD-10-CM

## 2019-09-23 DIAGNOSIS — Z87891 Personal history of nicotine dependence: Secondary | ICD-10-CM

## 2019-09-23 DIAGNOSIS — Z841 Family history of disorders of kidney and ureter: Secondary | ICD-10-CM

## 2019-09-23 DIAGNOSIS — J810 Acute pulmonary edema: Secondary | ICD-10-CM

## 2019-09-23 DIAGNOSIS — I5031 Acute diastolic (congestive) heart failure: Secondary | ICD-10-CM

## 2019-09-23 DIAGNOSIS — I509 Heart failure, unspecified: Secondary | ICD-10-CM

## 2019-09-23 DIAGNOSIS — E1129 Type 2 diabetes mellitus with other diabetic kidney complication: Secondary | ICD-10-CM | POA: Diagnosis present

## 2019-09-23 MED ORDER — NITROGLYCERIN 2 % TD OINT
1.0000 [in_us] | TOPICAL_OINTMENT | Freq: Once | TRANSDERMAL | Status: AC
  Administered 2019-09-24: 1 [in_us] via TOPICAL

## 2019-09-23 NOTE — ED Triage Notes (Signed)
Pt presents with SOB for the past three weeks.  Pt has been seeing his PCP and is supposed to see his cardiologist tomorrow.  Today pt is feeling weak and has no appetite. Pt started lasix almost three months ago. Pt reports increased SOB upon exertion. Pt denies any pain in triage.

## 2019-09-23 NOTE — ED Provider Notes (Signed)
[facility] DEPT Provider Note   CSN: [id number] Arrival date & time: 09/23/19  2242     History Chief Complaint  Patient presents with  . Shortness of Breath  Level 5 caveat due to acuity of condition  Adam Reid is a 74 y.o. male.  The history is provided by the patient.  Shortness of Breath Severity:  Moderate Onset quality:  Gradual Timing:  Intermittent Progression:  Worsening Chronicity:  New Relieved by:  Rest Worsened by:  Activity Associated symptoms: cough   Associated symptoms: no chest pain and no fever   Patient history chronic kidney disease, diabetes, CHF presents with shortness of breath.  Patient has a known history of CHF and has had this Lasix increased recently due to increasing shortness of breath.  Over the past 24 hours his dyspnea has worsened.  He is having decreased p.o. intake.  He is also having increasing lower extremity edema  Wife gives history as patient does not speak [place], they refused to use medical interpreter     Past Medical History:  Diagnosis Date  . Chronic kidney disease   . Diabetes mellitus without complication ([place])   . Hyperlipidemia   . Hypertension   . Thyroid disease    was on supplement, taken off by [name]    Patient Active Problem List   Diagnosis Date Noted  . Secondary hyperparathyroidism, renal ([place]) 12/29/2016  . Smoker 03/26/2016  . Hypotension 01/29/2016  . Abnormal myocardial perfusion study 01/29/2016  . Depression 12/09/2015  . Hyperlipidemia LDL goal <100 06/28/2014  . Diabetes mellitus with renal manifestations, controlled ([place])   . HTN (hypertension) 06/24/2014  . 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 BPH (benign prostatic hyperplasia) 06/24/2014    Past Surgical History:  Procedure Laterality Date  . APPENDECTOMY    . CARDIAC CATHETERIZATION N/A 01/30/2016   Procedure: Left Heart Cath and Coronary  Angiography;  Surgeon: [name], MD;  Location: [facility];  Service: Cardiovascular;  Laterality: N/A;       Family History  Problem Relation Age of Onset  . Hypertension Mother   . Hyperlipidemia Mother   . Hypertension Sister   . Kidney disease Father   . Heart disease Brother 30       open heart surgery  . Colon cancer Neg Hx     Social History   Tobacco Use  . Smoking status: Former Smoker    Packs/day: 0.25    Years: 48.00    Pack years: 12.00    Types: Cigarettes    Quit date: 07/16/2019    Years since quitting: 0.1  . Smokeless tobacco: Never Used  . Tobacco comment: 09/07/16 3-4 cigs daily, trying to cut back  Substance Use Topics  . Alcohol use: No    Alcohol/week: 0.0 standard drinks  . Drug use: No    Home Medications Prior to Admission medications   Medication Sig Start Date End Date Taking? Authorizing Provider  albuterol (VENTOLIN HFA) 108 (90 Base) MCG/ACT inhaler Inhale 2 puffs into the lungs every 6 (six) hours as needed for wheezing or shortness of breath. 07/02/19   Maximiano Coss, NP  aspirin EC 81 MG EC tablet Take 1 tablet (81 mg total) by mouth daily. Patient taking differently: Take 81 mg by mouth every other day.  12/03/15   Mayo, Pete Pelt, MD  atorvastatin (LIPITOR) 80 MG tablet TAKE 1 TABLET(80 MG) BY  MOUTH DAILY 07/20/19   [name], MD  blood glucose meter kit and supplies Use up to two times daily as directed  ICD10 E10.9 E11.9 02/20/19   [name], MD  Blood Glucose Monitoring Suppl [facility] VERIO) w/Device KIT USE UTD 09/03/15   [provider]  Cholecalciferol (VITAMIN D-3 PO) Take 1 tablet by mouth See admin instructions. Three times a week. No specific days    [provider]  citalopram (CELEXA) 20 MG tablet TAKE 1 TABLET(20 MG) BY MOUTH DAILY 07/20/19   [name], MD  cyanocobalamin 500 MCG tablet Take 500 mcg by mouth daily. Reported on 12/02/2015    [provider]    furosemide (LASIX) 80 MG tablet Take 1 tablet (80 mg total) by mouth daily. 08/26/19 11/24/19  Elouise Munroe, MD  glipiZIDE (GLUCOTROL XL) 10 MG 24 hr tablet TAKE 1 TABLET(10 MG) BY MOUTH DAILY WITH BREAKFAST Patient taking differently: 12.5 mg.  07/30/19   [name], MD  GlucoCom Lancets MISC Use for home glucose monitoring 09/02/15   Harrison Mons, PA  glucose blood (ONETOUCH VERIO) test strip Test up to 2 times per day.  Uncontrolled diabetes with hyperglycemia and stage 4 CKD. 04/13/19   [name], MD  metoprolol succinate (TOPROL-XL) 25 MG 24 hr tablet Take 1 tablet (25 mg total) by mouth daily. 08/10/19   [name], MD  nitroGLYCERIN (NITROSTAT) 0.4 MG SL tablet Place 1 tablet (0.4 mg total) under the tongue every 5 (five) minutes x 3 doses as needed for chest pain. 01/31/16   Arbutus Leas, NP  pioglitazone (ACTOS) 30 MG tablet TAKE 1 TABLET(30 MG) BY MOUTH DAILY 07/20/19   [name], MD  polyethylene glycol (MIRALAX / GLYCOLAX) 17 g packet Take 17 g by mouth daily.    [provider]  potassium chloride SA (KLOR-CON) 20 MEQ tablet Take 20 meq daily for 3 days only 09/15/19   [name], MD  senna (SENOKOT) 8.6 MG tablet Take 1 tablet by mouth as needed.     [provider]  sitaGLIPtin (JANUVIA) 25 MG tablet Take 1 tablet (25 mg total) by mouth daily. 07/09/19   [name], MD  tamsulosin (FLOMAX) 0.4 MG CAPS capsule TAKE 2 CAPSULES(0.8 MG) BY MOUTH DAILY 07/20/19   [name], MD    Allergies    Patient has no known allergies.  Review of Systems   Review of Systems  Unable to perform ROS: Acuity of condition  Constitutional: Negative for fever.  Respiratory: Positive for cough and shortness of breath.   Cardiovascular: Negative for chest pain.    Physical Exam Updated Vital Signs BP 134/86   Pulse 92   Temp 98 F (36.7 C) (Oral)   Resp (!) 29   SpO2 93%   Physical Exam CONSTITUTIONAL: Elderly, distress  noted HEAD: Normocephalic/atraumatic EYES: EOMI/PERRL ENMT: Mucous membranes moist NECK: supple no meningeal signs, +JVD SPINE/BACK:entire spine nontender CV: S1/S2 noted, murmur noted LUNGS: Decreased breath sounds are bilaterally ABDOMEN: soft, nontender NEURO: Pt is awake/alert/appropriate, moves all extremitiesx4.  No facial droop.   EXTREMITIES: pulses normal/equal, full ROM, pitting edema to bilateral lower extremities SKIN: warm, color normal PSYCH: no abnormalities of mood noted, alert and oriented to situation  ED Results / Procedures / Treatments   Labs (all labs ordered are listed, but only abnormal results are displayed) Labs Reviewed  BASIC METABOLIC PANEL - Abnormal; Notable for the following components:  Result Value   Glucose, Bld 160 (*)    BUN 65 (*)    Creatinine, Ser 2.45 (*)    GFR calc non Af Amer 25 (*)    GFR calc Af Amer 29 (*)    Anion gap 16 (*)    All other components within normal limits  CBC WITH DIFFERENTIAL/PLATELET - Abnormal; Notable for the following components:   WBC 13.1 (*)    RBC 3.38 (*)    Hemoglobin 8.7 (*)    HCT 28.7 (*)    MCH 25.7 (*)    RDW 18.0 (*)    Platelets 409 (*)    Neutro Abs 11.4 (*)    Abs Immature Granulocytes 0.10 (*)    All other components within normal limits  BRAIN NATRIURETIC PEPTIDE - Abnormal; Notable for the following components:   B Natriuretic Peptide 1,723.7 (*)    All other components within normal limits  TROPONIN I (HIGH SENSITIVITY)    EKG EKG Interpretation  Date/Time:  Sunday September 23 2019 23:06:13 EDT Ventricular Rate:  94 PR Interval:    QRS Duration: 78 QT Interval:  421 QTC Calculation: 527 R Axis:   40 Text Interpretation: Sinus rhythm Atrial premature complexes in couplets Prolonged QT interval Abnormal ekg Confirmed by Ripley Fraise [phone]) on 09/23/2019 11:09:41 PM   Radiology DG Chest Port 1 View  Result Date: 09/23/2019 CLINICAL DATA:  Shortness of breath x3 weeks.  EXAM: PORTABLE CHEST 1 VIEW COMPARISON:  September 17, 2019 FINDINGS: Persistent moderate severity areas of atelectasis and/or infiltrate are seen within the bilateral lung bases. Small, predominant stable bilateral pleural effusions are seen. There is no evidence of a pneumothorax. There is stable mild to moderate severity enlargement of the cardiac silhouette. The visualized skeletal structures are unremarkable. IMPRESSION: Persistent moderate severity areas of bibasilar atelectasis and/or infiltrate with small, stable bilateral pleural effusions. Electronically Signed   By: Virgina Norfolk M.D.   On: 09/23/2019 23:32    Procedures .Critical Care Performed by: Ripley Fraise, MD Authorized by: Ripley Fraise, MD   Critical care provider statement:    Critical care time (minutes):  40   Critical care start time:  09/24/2019 12:30 AM   Critical care end time:  09/24/2019 12:10 AM   Critical care time was exclusive of:  Separately billable procedures and treating other patients   Critical care was necessary to treat or prevent imminent or life-threatening deterioration of the following conditions:  Respiratory failure and cardiac failure   Critical care was time spent personally by me on the following activities:  Discussions with consultants, evaluation of patient's response to treatment, examination of patient, development of treatment plan with patient or surrogate, ordering and review of radiographic studies, ordering and review of laboratory studies, pulse oximetry, re-evaluation of patient's condition and review of old charts   I assumed direction of critical care for this patient from another provider in my specialty: no       Medications Ordered in ED Medications  nitroGLYCERIN (NITROGLYN) 2 % ointment 1 inch (0 inches Topical Hold 09/24/19 0030)  furosemide (LASIX) injection 80 mg (80 mg Intravenous Given 09/24/19 0103)    ED Course  I have reviewed the triage vital signs and the  nursing notes.  Pertinent labs & imaging results that were available during my care of the patient were reviewed by me and considered in my medical decision making (see chart for details).    MDM Rules/Calculators/A&P  11:48 PM Patient w/signs and symptoms with CHF exacerbation.  Imaging and labs are pending this time.  Patient had COVID-19 back in December. 1:25 AM Discussed with Dr. Hal Hope for admission.  Suspect acute on chronic CHF.  Patient now has an oxygen requirement as he became hypoxic, he is now on nasal cannula.  He also reported very mild chest pain, but is in no acute distress otherwise.  Repeat EKG is unchanged, see below.  Troponins have been sent.   EKG Interpretation  Date/Time:  Monday September 24 2019 01:09:23 EDT Ventricular Rate:  99 PR Interval:    QRS Duration: 81 QT Interval:  433 QTC Calculation: 556 R Axis:   47 Text Interpretation: Sinus rhythm Prolonged QT interval No significant change since last tracing Confirmed by Ripley Fraise [phone]) on 09/24/2019 1:23:04 AM          Final Clinical Impression(s) / ED Diagnoses Final diagnoses:  Acute on chronic combined systolic and diastolic congestive heart failure ([place])  Acute respiratory failure with hypoxia ([place])  Acute pulmonary edema ([place])    Rx / DC Orders ED Discharge Orders    None       Ripley Fraise, MD 09/24/19 0126

## 2019-09-24 ENCOUNTER — Encounter (HOSPITAL_COMMUNITY): Payer: Self-pay | Admitting: Internal Medicine

## 2019-09-24 ENCOUNTER — Ambulatory Visit: Payer: Medicare Other | Admitting: Internal Medicine

## 2019-09-24 DIAGNOSIS — R0602 Shortness of breath: Secondary | ICD-10-CM | POA: Diagnosis present

## 2019-09-24 DIAGNOSIS — N401 Enlarged prostate with lower urinary tract symptoms: Secondary | ICD-10-CM

## 2019-09-24 DIAGNOSIS — Z7982 Long term (current) use of aspirin: Secondary | ICD-10-CM | POA: Diagnosis not present

## 2019-09-24 DIAGNOSIS — D649 Anemia, unspecified: Secondary | ICD-10-CM | POA: Diagnosis present

## 2019-09-24 DIAGNOSIS — I5031 Acute diastolic (congestive) heart failure: Secondary | ICD-10-CM

## 2019-09-24 DIAGNOSIS — N184 Chronic kidney disease, stage 4 (severe): Secondary | ICD-10-CM

## 2019-09-24 DIAGNOSIS — J9601 Acute respiratory failure with hypoxia: Secondary | ICD-10-CM | POA: Diagnosis present

## 2019-09-24 DIAGNOSIS — Z8249 Family history of ischemic heart disease and other diseases of the circulatory system: Secondary | ICD-10-CM | POA: Diagnosis not present

## 2019-09-24 DIAGNOSIS — Z8349 Family history of other endocrine, nutritional and metabolic diseases: Secondary | ICD-10-CM | POA: Diagnosis not present

## 2019-09-24 DIAGNOSIS — E785 Hyperlipidemia, unspecified: Secondary | ICD-10-CM | POA: Diagnosis present

## 2019-09-24 DIAGNOSIS — D638 Anemia in other chronic diseases classified elsewhere: Secondary | ICD-10-CM | POA: Diagnosis present

## 2019-09-24 DIAGNOSIS — N179 Acute kidney failure, unspecified: Secondary | ICD-10-CM | POA: Diagnosis present

## 2019-09-24 DIAGNOSIS — I480 Paroxysmal atrial fibrillation: Secondary | ICD-10-CM

## 2019-09-24 DIAGNOSIS — N183 Chronic kidney disease, stage 3 unspecified: Secondary | ICD-10-CM | POA: Diagnosis not present

## 2019-09-24 DIAGNOSIS — E1122 Type 2 diabetes mellitus with diabetic chronic kidney disease: Secondary | ICD-10-CM | POA: Diagnosis present

## 2019-09-24 DIAGNOSIS — D631 Anemia in chronic kidney disease: Secondary | ICD-10-CM | POA: Diagnosis present

## 2019-09-24 DIAGNOSIS — Z8616 Personal history of COVID-19: Secondary | ICD-10-CM | POA: Diagnosis not present

## 2019-09-24 DIAGNOSIS — I5033 Acute on chronic diastolic (congestive) heart failure: Secondary | ICD-10-CM | POA: Diagnosis not present

## 2019-09-24 DIAGNOSIS — I5043 Acute on chronic combined systolic (congestive) and diastolic (congestive) heart failure: Secondary | ICD-10-CM | POA: Diagnosis present

## 2019-09-24 DIAGNOSIS — N2581 Secondary hyperparathyroidism of renal origin: Secondary | ICD-10-CM | POA: Diagnosis present

## 2019-09-24 DIAGNOSIS — Z841 Family history of disorders of kidney and ureter: Secondary | ICD-10-CM | POA: Diagnosis not present

## 2019-09-24 DIAGNOSIS — Z7984 Long term (current) use of oral hypoglycemic drugs: Secondary | ICD-10-CM | POA: Diagnosis not present

## 2019-09-24 DIAGNOSIS — I130 Hypertensive heart and chronic kidney disease with heart failure and stage 1 through stage 4 chronic kidney disease, or unspecified chronic kidney disease: Secondary | ICD-10-CM | POA: Diagnosis present

## 2019-09-24 DIAGNOSIS — R351 Nocturia: Secondary | ICD-10-CM

## 2019-09-24 DIAGNOSIS — I509 Heart failure, unspecified: Secondary | ICD-10-CM

## 2019-09-24 DIAGNOSIS — Z87891 Personal history of nicotine dependence: Secondary | ICD-10-CM | POA: Diagnosis not present

## 2019-09-24 DIAGNOSIS — N400 Enlarged prostate without lower urinary tract symptoms: Secondary | ICD-10-CM | POA: Diagnosis present

## 2019-09-24 DIAGNOSIS — I959 Hypotension, unspecified: Secondary | ICD-10-CM | POA: Diagnosis present

## 2019-09-24 LAB — CBC
HCT: 28.2 % — ABNORMAL LOW (ref 39.0–52.0)
Hemoglobin: 8.4 g/dL — ABNORMAL LOW (ref 13.0–17.0)
MCH: 24.9 pg — ABNORMAL LOW (ref 26.0–34.0)
MCHC: 29.8 g/dL — ABNORMAL LOW (ref 30.0–36.0)
MCV: 83.7 fL (ref 80.0–100.0)
Platelets: 420 10*3/uL — ABNORMAL HIGH (ref 150–400)
RBC: 3.37 MIL/uL — ABNORMAL LOW (ref 4.22–5.81)
RDW: 17.9 % — ABNORMAL HIGH (ref 11.5–15.5)
WBC: 12.6 10*3/uL — ABNORMAL HIGH (ref 4.0–10.5)
nRBC: 0 % (ref 0.0–0.2)

## 2019-09-24 LAB — CBC WITH DIFFERENTIAL/PLATELET
Abs Immature Granulocytes: 0.1 10*3/uL — ABNORMAL HIGH (ref 0.00–0.07)
Basophils Absolute: 0 10*3/uL (ref 0.0–0.1)
Basophils Relative: 0 %
Eosinophils Absolute: 0 10*3/uL (ref 0.0–0.5)
Eosinophils Relative: 0 %
HCT: 28.7 % — ABNORMAL LOW (ref 39.0–52.0)
Hemoglobin: 8.7 g/dL — ABNORMAL LOW (ref 13.0–17.0)
Immature Granulocytes: 1 %
Lymphocytes Relative: 5 %
Lymphs Abs: 0.7 10*3/uL (ref 0.7–4.0)
MCH: 25.7 pg — ABNORMAL LOW (ref 26.0–34.0)
MCHC: 30.3 g/dL (ref 30.0–36.0)
MCV: 84.9 fL (ref 80.0–100.0)
Monocytes Absolute: 0.9 10*3/uL (ref 0.1–1.0)
Monocytes Relative: 7 %
Neutro Abs: 11.4 10*3/uL — ABNORMAL HIGH (ref 1.7–7.7)
Neutrophils Relative %: 87 %
Platelets: 409 10*3/uL — ABNORMAL HIGH (ref 150–400)
RBC: 3.38 MIL/uL — ABNORMAL LOW (ref 4.22–5.81)
RDW: 18 % — ABNORMAL HIGH (ref 11.5–15.5)
WBC: 13.1 10*3/uL — ABNORMAL HIGH (ref 4.0–10.5)
nRBC: 0 % (ref 0.0–0.2)

## 2019-09-24 LAB — GLUCOSE, CAPILLARY
Glucose-Capillary: 188 mg/dL — ABNORMAL HIGH (ref 70–99)
Glucose-Capillary: 126 mg/dL — ABNORMAL HIGH (ref 70–99)
Glucose-Capillary: 222 mg/dL — ABNORMAL HIGH (ref 70–99)
Glucose-Capillary: 164 mg/dL — ABNORMAL HIGH (ref 70–99)

## 2019-09-24 LAB — BASIC METABOLIC PANEL
Anion gap: 15 (ref 5–15)
Anion gap: 16 — ABNORMAL HIGH (ref 5–15)
BUN: 63 mg/dL — ABNORMAL HIGH (ref 8–23)
BUN: 65 mg/dL — ABNORMAL HIGH (ref 8–23)
CO2: 23 mmol/L (ref 22–32)
CO2: 23 mmol/L (ref 22–32)
Calcium: 9 mg/dL (ref 8.9–10.3)
Calcium: 9.2 mg/dL (ref 8.9–10.3)
Chloride: 98 mmol/L (ref 98–111)
Chloride: 99 mmol/L (ref 98–111)
Creatinine, Ser: 2.39 mg/dL — ABNORMAL HIGH (ref 0.61–1.24)
Creatinine, Ser: 2.45 mg/dL — ABNORMAL HIGH (ref 0.61–1.24)
GFR calc Af Amer: 29 mL/min — ABNORMAL LOW (ref >60)
GFR calc Af Amer: 30 mL/min — ABNORMAL LOW (ref >60)
GFR calc non Af Amer: 25 mL/min — ABNORMAL LOW (ref >60)
GFR calc non Af Amer: 26 mL/min — ABNORMAL LOW (ref >60)
Glucose, Bld: 145 mg/dL — ABNORMAL HIGH (ref 70–99)
Glucose, Bld: 160 mg/dL — ABNORMAL HIGH (ref 70–99)
Potassium: 3.2 mmol/L — ABNORMAL LOW (ref 3.5–5.1)
Potassium: 3.6 mmol/L (ref 3.5–5.1)
Sodium: 136 mmol/L (ref 135–145)
Sodium: 138 mmol/L (ref 135–145)

## 2019-09-24 LAB — TROPONIN I (HIGH SENSITIVITY)
Troponin I (High Sensitivity): 53 ng/L — ABNORMAL HIGH (ref <18)
Troponin I (High Sensitivity): 61 ng/L — ABNORMAL HIGH (ref <18)

## 2019-09-24 LAB — PROCALCITONIN: Procalcitonin: 0.58 ng/mL

## 2019-09-24 LAB — TSH: TSH: 3.691 u[IU]/mL (ref 0.350–4.500)

## 2019-09-24 LAB — BRAIN NATRIURETIC PEPTIDE: B Natriuretic Peptide: 1723.7 pg/mL — ABNORMAL HIGH (ref 0.0–100.0)

## 2019-09-24 LAB — MAGNESIUM: Magnesium: 2.3 mg/dL (ref 1.7–2.4)

## 2019-09-24 MED ORDER — ASPIRIN EC 81 MG PO TBEC
81.0000 mg | DELAYED_RELEASE_TABLET | ORAL | Status: DC
  Administered 2019-09-24 – 2019-09-26 (×2): 81 mg via ORAL
  Filled 2019-09-24 (×3): qty 1

## 2019-09-24 MED ORDER — ALBUTEROL SULFATE HFA 108 (90 BASE) MCG/ACT IN AERS: 2.0000 | INHALATION_SPRAY | Freq: Four times a day (QID) | RESPIRATORY_TRACT | Status: DC | PRN

## 2019-09-24 MED ORDER — METOPROLOL TARTRATE 5 MG/5ML IV SOLN
5.0000 mg | Freq: Four times a day (QID) | INTRAVENOUS | Status: AC
  Administered 2019-09-24: 5 mg via INTRAVENOUS
  Filled 2019-09-24: qty 5

## 2019-09-24 MED ORDER — ASPIRIN 81 MG PO CHEW
324.0000 mg | CHEWABLE_TABLET | Freq: Once | ORAL | Status: AC
  Administered 2019-09-24: 324 mg via ORAL
  Filled 2019-09-24: qty 4

## 2019-09-24 MED ORDER — CALCITRIOL 0.25 MCG PO CAPS
0.2500 ug | ORAL_CAPSULE | ORAL | Status: DC
  Administered 2019-09-24 – 2019-09-26 (×2): 0.25 ug via ORAL
  Filled 2019-09-24 (×2): qty 1

## 2019-09-24 MED ORDER — ACETAMINOPHEN 325 MG PO TABS: 650.0000 mg | ORAL_TABLET | Freq: Four times a day (QID) | ORAL | Status: DC | PRN

## 2019-09-24 MED ORDER — INSULIN ASPART 100 UNIT/ML ~~LOC~~ SOLN
0.0000 [IU] | Freq: Three times a day (TID) | SUBCUTANEOUS | Status: DC
  Administered 2019-09-24: 2 [IU] via SUBCUTANEOUS
  Administered 2019-09-24: 3 [IU] via SUBCUTANEOUS
  Administered 2019-09-24 – 2019-09-25 (×3): 1 [IU] via SUBCUTANEOUS
  Administered 2019-09-25: 5 [IU] via SUBCUTANEOUS
  Administered 2019-09-26: 2 [IU] via SUBCUTANEOUS
  Administered 2019-09-26: 3 [IU] via SUBCUTANEOUS

## 2019-09-24 MED ORDER — ACETAMINOPHEN 650 MG RE SUPP: 650.0000 mg | Freq: Four times a day (QID) | RECTAL | Status: DC | PRN

## 2019-09-24 MED ORDER — CITALOPRAM HYDROBROMIDE 20 MG PO TABS
20.0000 mg | ORAL_TABLET | Freq: Every day | ORAL | Status: DC
  Administered 2019-09-24 – 2019-09-26 (×3): 20 mg via ORAL
  Filled 2019-09-24 (×3): qty 1

## 2019-09-24 MED ORDER — TAMSULOSIN HCL 0.4 MG PO CAPS
0.8000 mg | ORAL_CAPSULE | Freq: Every day | ORAL | Status: DC
  Administered 2019-09-24 – 2019-09-26 (×3): 0.8 mg via ORAL
  Filled 2019-09-24 (×3): qty 2

## 2019-09-24 MED ORDER — POTASSIUM CHLORIDE CRYS ER 20 MEQ PO TBCR
40.0000 meq | EXTENDED_RELEASE_TABLET | Freq: Once | ORAL | Status: AC
  Administered 2019-09-24: 40 meq via ORAL
  Filled 2019-09-24: qty 2

## 2019-09-24 MED ORDER — NITROGLYCERIN 2 % TD OINT
TOPICAL_OINTMENT | TRANSDERMAL | Status: AC
  Filled 2019-09-24: qty 1

## 2019-09-24 MED ORDER — SENNA 8.6 MG PO TABS: 1.0000 | ORAL_TABLET | ORAL | Status: DC | PRN

## 2019-09-24 MED ORDER — METOPROLOL SUCCINATE ER 25 MG PO TB24
25.0000 mg | ORAL_TABLET | Freq: Every day | ORAL | Status: DC
  Administered 2019-09-24 – 2019-09-26 (×3): 25 mg via ORAL
  Filled 2019-09-24 (×3): qty 1

## 2019-09-24 MED ORDER — HEPARIN SODIUM (PORCINE) 5000 UNIT/ML IJ SOLN
5000.0000 [IU] | Freq: Three times a day (TID) | INTRAMUSCULAR | Status: DC
  Administered 2019-09-24 – 2019-09-26 (×7): 5000 [IU] via SUBCUTANEOUS
  Filled 2019-09-24 (×7): qty 1

## 2019-09-24 MED ORDER — FUROSEMIDE 10 MG/ML IJ SOLN
40.0000 mg | Freq: Two times a day (BID) | INTRAMUSCULAR | Status: DC
  Administered 2019-09-24 – 2019-09-26 (×5): 40 mg via INTRAVENOUS
  Filled 2019-09-24 (×5): qty 4

## 2019-09-24 MED ORDER — CYANOCOBALAMIN 250 MCG PO TABS
500.0000 ug | ORAL_TABLET | Freq: Every day | ORAL | Status: DC
  Administered 2019-09-24 – 2019-09-26 (×3): 500 ug via ORAL
  Filled 2019-09-24 (×3): qty 2

## 2019-09-24 MED ORDER — NITROGLYCERIN 0.4 MG SL SUBL: 0.4000 mg | SUBLINGUAL_TABLET | SUBLINGUAL | Status: DC | PRN

## 2019-09-24 MED ORDER — ONDANSETRON HCL 4 MG/2ML IJ SOLN: 4.0000 mg | Freq: Four times a day (QID) | INTRAMUSCULAR | Status: DC | PRN

## 2019-09-24 MED ORDER — ALBUTEROL SULFATE (2.5 MG/3ML) 0.083% IN NEBU: 2.5000 mg | INHALATION_SOLUTION | Freq: Four times a day (QID) | RESPIRATORY_TRACT | Status: DC | PRN

## 2019-09-24 MED ORDER — ATORVASTATIN CALCIUM 40 MG PO TABS
80.0000 mg | ORAL_TABLET | Freq: Every day | ORAL | Status: DC
  Administered 2019-09-24 – 2019-09-26 (×3): 80 mg via ORAL
  Filled 2019-09-24 (×3): qty 2

## 2019-09-24 MED ORDER — ONDANSETRON HCL 4 MG PO TABS: 4.0000 mg | ORAL_TABLET | Freq: Four times a day (QID) | ORAL | Status: DC | PRN

## 2019-09-24 MED ORDER — FUROSEMIDE 10 MG/ML IJ SOLN
80.0000 mg | Freq: Once | INTRAMUSCULAR | Status: AC
  Administered 2019-09-24: 80 mg via INTRAVENOUS
  Filled 2019-09-24: qty 8

## 2019-09-24 NOTE — H&P (Addendum)
History and Physical    Adam Reid PXT:[id number] DOB: Oct 13, 1945 DOA: 09/23/2019  PCP: [name], MD  Patient coming from: Home.  Chief Complaint: Shortness of breath.  Patient speaks [place] language.  Patient's wife provided the history.  HPI: Adam Reid is a 74 y.o. male with history of diastolic CHF recently had 2D echo in February 2021 last month showed EF of 55 to 60% with grade 3 diastolic dysfunction who was diagnosed with COVID-19 infection in December 2020 3 months ago at the time patient was treated symptomatically and was not hospitalized started experiencing increasing shortness of breath with peripheral edema on minimal exertion and orthopnea with paroxysmal nocturnal dyspnea over the last 4 weeks.  Had gone to cardiology of recent 2D echo showed grade 3 diastolic dysfunction with EF of 55 to 60%.  Patient was initially placed on 80 mg p.o. Lasix for 3 days followed by 40 daily.  Last week patient had to go to the primary care office because of worsening shortness of breath again his Lasix dose was increased for 3 days and back to 40 mg daily.  Patient has become again short of breath and presents to the ER.  Denies chest pain fever chills or productive cough.  ED Course: In the ER EKG shows normal sinus rhythm with prolonged QTC.  Chest x-ray showed bilateral possible congestion was infiltrates with pleural effusion.  BNP has markedly increased from baseline and is around 1700 high-sensitivity troponins were 53 and 61 WBC count was 13.1 creatinine is further worsened from 2.2 about a week ago and is presently 2.4.  Blood glucose of 160.  Patient also has been a worsening anemia and has been referred to her gastroenterologist as per the primary care's note.  Patient was given 80 mg IV in the ER and admitted for further management.  Review of Systems: As per HPI, rest all negative.   Past Medical History:  Diagnosis Date  . Chronic kidney disease   . Diabetes  mellitus without complication ([place])   . Hyperlipidemia   . Hypertension   . Thyroid disease    was on supplement, taken off by [name]    Past Surgical History:  Procedure Laterality Date  . APPENDECTOMY    . CARDIAC CATHETERIZATION N/A 01/30/2016   Procedure: Left Heart Cath and Coronary Angiography;  Surgeon: [name], MD;  Location: [facility];  Service: Cardiovascular;  Laterality: N/A;     reports that he quit smoking about 2 months ago. His smoking use included cigarettes. He has a 12.00 pack-year smoking history. He has never used smokeless tobacco. He reports that he does not drink alcohol or use drugs.  No Known Allergies  Family History  Problem Relation Age of Onset  . Hypertension Mother   . Hyperlipidemia Mother   . Hypertension Sister   . Kidney disease Father   . Heart disease Brother 78       open heart surgery  . Colon cancer Neg Hx     Prior to Admission medications   Medication Sig Start Date End Date Taking? Authorizing Provider  albuterol (VENTOLIN HFA) 108 (90 Base) MCG/ACT inhaler Inhale 2 puffs into the lungs every 6 (six) hours as needed for wheezing or shortness of breath. 07/02/19  Yes Maximiano Coss, NP  aspirin EC 81 MG EC tablet Take 1 tablet (81 mg total) by mouth daily. Patient taking differently: Take 81 mg by mouth every other day.  12/03/15  Yes  Mayo, Pete Pelt, MD  atorvastatin (LIPITOR) 80 MG tablet TAKE 1 TABLET(80 MG) BY MOUTH DAILY Patient taking differently: Take 80 mg by mouth daily.  07/20/19  Yes [name], MD  blood glucose meter kit and supplies Use up to two times daily as directed  ICD10 E10.9 E11.9 02/20/19  Yes [name], MD  Blood Glucose Monitoring Suppl (ONETOUCH VERIO) w/Device KIT as directed.  09/03/15  Yes [provider]  calcitRIOL (ROCALTROL) 0.25 MCG capsule Take 0.25 mcg by mouth every other day.  09/10/19  Yes [provider]  Cholecalciferol (VITAMIN D-3 PO) Take 1  tablet by mouth daily.    Yes [provider]  citalopram (CELEXA) 20 MG tablet TAKE 1 TABLET(20 MG) BY MOUTH DAILY Patient taking differently: Take 20 mg by mouth daily. TAKE 1 TABLET(20 MG) BY MOUTH DAILY 07/20/19  Yes [name], MD  cyanocobalamin 500 MCG tablet Take 500 mcg by mouth daily. Reported on 12/02/2015   Yes [provider]  furosemide (LASIX) 80 MG tablet Take 1 tablet (80 mg total) by mouth daily. Patient taking differently: Take 40 mg by mouth daily.  08/26/19 11/24/19 Yes Elouise Munroe, MD  glipiZIDE (GLUCOTROL XL) 10 MG 24 hr tablet TAKE 1 TABLET(10 MG) BY MOUTH DAILY WITH BREAKFAST Patient taking differently: Take 12.5 mg by mouth daily with breakfast.  07/30/19  Yes [name], MD  GlucoCom Lancets MISC Use for home glucose monitoring 09/02/15  Yes Jeffery, Chelle, PA  glucose blood (ONETOUCH VERIO) test strip Test up to 2 times per day.  Uncontrolled diabetes with hyperglycemia and stage 4 CKD. 04/13/19  Yes [name], MD  metoprolol succinate (TOPROL-XL) 25 MG 24 hr tablet Take 1 tablet (25 mg total) by mouth daily. 08/10/19  Yes [name], MD  nitroGLYCERIN (NITROSTAT) 0.4 MG SL tablet Place 1 tablet (0.4 mg total) under the tongue every 5 (five) minutes x 3 doses as needed for chest pain. 01/31/16  Yes [name] E, NP  pioglitazone (ACTOS) 30 MG tablet TAKE 1 TABLET(30 MG) BY MOUTH DAILY Patient taking differently: Take 30 mg by mouth daily.  07/20/19  Yes [name], MD  polyethylene glycol (MIRALAX / GLYCOLAX) 17 g packet Take 17 g by mouth daily as needed for mild constipation or moderate constipation.    Yes [provider]  senna (SENOKOT) 8.6 MG tablet Take 1 tablet by mouth as needed for constipation.    Yes [provider]  sitaGLIPtin (JANUVIA) 25 MG tablet Take 1 tablet (25 mg total) by mouth daily. 07/09/19  Yes [name], MD  tamsulosin (FLOMAX) 0.4 MG CAPS capsule TAKE 2 CAPSULES(0.8  MG) BY MOUTH DAILY Patient taking differently: Take 0.8 mg by mouth daily.  07/20/19  Yes [name], MD  potassium chloride SA (KLOR-CON) 20 MEQ tablet Take 20 meq daily for 3 days only Patient not taking: Reported on 09/24/2019 09/15/19   [name], MD    Physical Exam: Constitutional: Moderately built and nourished. Vitals:   09/24/19 0230 09/24/19 0300 09/24/19 0348 09/24/19 0420  BP: (!) 154/86 (!) 166/83 (!) 158/99   Pulse: 96 96 98   Resp: (!) 24 (!) 30 20   Temp:   98 F (36.7 C)   TempSrc:   Oral   SpO2: 96% 96% 99%   Weight:    61.5 kg   Eyes: Anicteric no pallor. ENMT: No discharge from the ears eyes nose or  mouth. Neck: JVD elevated no mass felt. Respiratory: No rhonchi or crepitations. Cardiovascular: S1-S2 heard. Abdomen: Soft nontender bowel sounds present. Musculoskeletal: No edema. Skin: No rash. Neurologic: Alert awake oriented to time place and person.  Moves all extremities. Psychiatric: Appears normal per normal affect.   Labs on Admission: I have personally reviewed following labs and imaging studies  CBC: Recent Labs  Lab 09/23/19 2338  WBC 13.1*  NEUTROABS 11.4*  HGB 8.7*  HCT 28.7*  MCV 84.9  PLT 025*   Basic Metabolic Panel: Recent Labs  Lab 09/17/19 1727 09/23/19 2338  NA 139 138  K 3.6 3.6  CL 96 99  CO2 23 23  GLUCOSE 91 160*  BUN 50* 65*  CREATININE 2.24* 2.45*  CALCIUM 9.7 9.2   GFR: Estimated Creatinine Clearance: 23.7 mL/min (A) (by C-G formula based on SCr of 2.45 mg/dL (H)). Liver Function Tests: No results for input(s): AST, ALT, ALKPHOS, BILITOT, PROT, ALBUMIN in the last 168 hours. No results for input(s): LIPASE, AMYLASE in the last 168 hours. No results for input(s): AMMONIA in the last 168 hours. Coagulation Profile: No results for input(s): INR, PROTIME in the last 168 hours. Cardiac Enzymes: No results for input(s): CKTOTAL, CKMB, CKMBINDEX, TROPONINI in the last 168 hours. BNP (last 3  results) Recent Labs    09/17/19 1727  PROBNP 17,017*   HbA1C: No results for input(s): HGBA1C in the last 72 hours. CBG: No results for input(s): GLUCAP in the last 168 hours. Lipid Profile: No results for input(s): CHOL, HDL, LDLCALC, TRIG, CHOLHDL, LDLDIRECT in the last 72 hours. Thyroid Function Tests: No results for input(s): TSH, T4TOTAL, FREET4, T3FREE, THYROIDAB in the last 72 hours. Anemia Panel: No results for input(s): VITAMINB12, FOLATE, FERRITIN, TIBC, IRON, RETICCTPCT in the last 72 hours. Urine analysis:    Component Value Date/Time   BILIRUBINUR neg 03/23/2015 1404   PROTEINUR 100 03/23/2015 1404   UROBILINOGEN 0.2 03/23/2015 1404   NITRITE neg 03/23/2015 1404   LEUKOCYTESUR Negative 03/23/2015 1404   Sepsis Labs: '@LABRCNTIP'$ (procalcitonin:4,lacticidven:4) )No results found for this or any previous visit (from the past 240 hour(s)).   Radiological Exams on Admission: DG Chest Port 1 View  Result Date: 09/23/2019 CLINICAL DATA:  Shortness of breath x3 weeks. EXAM: PORTABLE CHEST 1 VIEW COMPARISON:  September 17, 2019 FINDINGS: Persistent moderate severity areas of atelectasis and/or infiltrate are seen within the bilateral lung bases. Small, predominant stable bilateral pleural effusions are seen. There is no evidence of a pneumothorax. There is stable mild to moderate severity enlargement of the cardiac silhouette. The visualized skeletal structures are unremarkable. IMPRESSION: Persistent moderate severity areas of bibasilar atelectasis and/or infiltrate with small, stable bilateral pleural effusions. Electronically Signed   By: Virgina Norfolk M.D.   On: 09/23/2019 23:32    EKG: Independently reviewed.  Normal sinus rhythm.  Assessment/Plan Principal Problem:   Acute CHF (congestive heart failure) (HCC) Active Problems:   BPH (benign prostatic hyperplasia)   Diabetes mellitus with renal manifestations, controlled ([place])   Hypotension   ARF (acute renal  failure) (HCC)   Anemia   Acute respiratory failure with hypoxia ([place])    1. Acute on chronic diastolic CHF last EF measured last month was 55 to 60% with grade 3 diastolic dysfunction had received Lasix 80 mg IV in the ER.  I have placed patient on 40 mg IV every 12.  Closely for intake.  Metabolic panel and daily weights. 2. Acute respiratory failure with hypoxia likely combination of CHF  worsening anemia and deconditioning from recent COVID-19 infection. 3. Acute on chronic kidney disease stage IV creatinine has been gradually worsening.  Closely monitor.  UA is pending. 4. Diabetes mellitus type 2 we will keep patient on sliding scale coverage.  Note that patient takes Actos at home.  May have discontinued given the CHF. 5. Normocytic normochromic anemia appears to be chronic and has referral to GI.  Follow CBC closely. 6. Prolonged QTC closely monitor for metabolic panel and avoid any QT prolonging medications. 7. Hypertension on metoprolol. 8. BPH on Flomax. 9. Patient was positive for Covid infection in December 2020. 10. Hyperlipidemia on statins. 11. History of depression on SSRI  Given that patient has acute CHF with multiple comorbidities will need close monitoring for any further deterioration in inpatient status.    DVT prophylaxis: Heparin. Code Status: Full code. Family Communication: Patient's wife. Disposition Plan: Home. Consults called: None. Admission status: Inpatient.   Rise Patience MD [facility] Pager [phone].  If 7PM-7AM, please contact night-coverage [email or web address] Password [facility]  09/24/2019, 4:44 AM

## 2019-09-24 NOTE — Progress Notes (Signed)
Same day note   Patient seen and examined at bedside.  Patient was admitted to the hospital for shortness of breath.  At the time of my evaluation, patient complains of breath, mild dyspnea but the feeling little better.  Physical examination reveals average built male in nasal cannula oxygen, decreased breath sounds bilaterally.  Legs with gross bilateral pitting edema.  Laboratory data and imaging was reviewed  Assessment and Plan.  Acute on chronic diastolic congestive heart failure.  On Lasix 40 twice daily.  Not much of urine output recorded.  Patient might need increased dose of Lasix and cardiology consultation.  Spoke with cardiology about consultation.  Continue aspirin Lipitor Lasix and metoprolol  Acute respiratory failure with hypoxia likely secondary to CHF, anemia and a recent COVID-19 infection.  Will wean oxygen as able.  Continue diuretics.  Oxygen as able.  Acute kidney injury on chronic kidney disease stage IV.  Will need to adjust the IV diuretics.  Will follow cardiology recommendation.  Pain.  Diabetes mellitus type 2.  Continue sliding scale insulin Accu-Cheks.  Essential hypertension.  Continue metoprolol.  History of BPH.  Continue Flomax.  COVID-19 infection December 2020.  Hyperlipidemia continue statins.  Language barrier.    No Charge  Signed,  Delila Pereyra, MD [facility]

## 2019-09-24 NOTE — Consult Note (Signed)
Cardiology Consultation:   Patient ID: Adam Reid MRN: [id number]; DOB: 11/22/1945  Admit date: 09/23/2019 Date of Consult: 09/24/2019  Primary Care Provider: [name], MD Primary Cardiologist: Elouise Munroe, MD    Patient Profile:   Adam Reid is a 74 y.o. male with a hx of diabetes mellitus, hypertension, hyperlipidemia, chronic stage IV kidney disease, nonobstructive coronary disease, chronic diastolic congestive heart failure for evaluation of acute on chronic diastolic congestive heart failure at the request of Flora Lipps MD.  History of Present Illness:   Cardiac catheterization July 2017 showed nonobstructive coronary disease.  Echocardiogram February 2021 showed normal LV function, mild left ventricular hypertrophy, grade 3 diastolic dysfunction, moderate left atrial enlargement, mild mitral regurgitation, mild tricuspid regurgitation.  Patient recently being evaluated for dyspnea that is felt to be multifactorial including diastolic congestive heart failure, deconditioning following recent Covid infection and anemia.  She was seen recently and her Lasix was decreased.  Since that time his wife states that his dyspnea on exertion has worsened.  He also complained of orthopnea and worsening bilateral lower extremity edema.  No chest pain, fevers, productive cough hemoptysis.  He was admitted and cardiology now asked to evaluate.  Note history was obtained with the assistance of his wife as patient does not speak [place].   Past Medical History:  Diagnosis Date  . Chronic kidney disease   . Diabetes mellitus without complication ([place])   . Hyperlipidemia   . Hypertension   . Thyroid disease    was on supplement, taken off by [name]    Past Surgical History:  Procedure Laterality Date  . APPENDECTOMY    . CARDIAC CATHETERIZATION N/A 01/30/2016   Procedure: Left Heart Cath and Coronary Angiography;  Surgeon: [name], MD;  Location: [facility];  Service: Cardiovascular;  Laterality: N/A;     Inpatient Medications: Scheduled Meds: . aspirin EC  81 mg Oral QODAY  . atorvastatin  80 mg Oral Daily  . calcitRIOL  0.25 mcg Oral QODAY  . citalopram  20 mg Oral Daily  . furosemide  40 mg Intravenous Q12H  . heparin  5,000 Units Subcutaneous Q8H  . insulin aspart  0-9 Units Subcutaneous TID WC  . metoprolol succinate  25 mg Oral Daily  . tamsulosin  0.8 mg Oral Daily  . vitamin B-12  500 mcg Oral Daily   Continuous Infusions:  PRN Meds: acetaminophen **OR** acetaminophen, albuterol, nitroGLYCERIN, senna  Allergies:   No Known Allergies  Social History:   Social History   Socioeconomic History  . Marital status: Married    Spouse name: Adam Reid  . Number of children: 1  . Years of education: 12th grade  . Highest education level: Not on file  Occupational History  . Occupation: Retired-accountant  Tobacco Use  . Smoking status: Former Smoker    Packs/day: 0.25    Years: 48.00    Pack years: 12.00    Types: Cigarettes    Quit date: 07/16/2019    Years since quitting: 0.1  . Smokeless tobacco: Never Used  . Tobacco comment: 09/07/16 3-4 cigs daily, trying to cut back  Substance and Sexual Activity  . Alcohol use: No    Alcohol/week: 0.0 standard drinks  . Drug use: No  . Sexual activity: Never    Partners: Female  Other Topics Concern  . Not on file  Social History Narrative   Originally from [place]. Came to the [place] in 2009, following their son who  came here for school.   Married.   Lives with his wife.   Their adult son lives in [place], [place], where he is in optometry school.   Education: [employer]   Exercise: No   Social Determinants of Radio broadcast assistant Strain:   . Difficulty of Paying Living Expenses:   Food Insecurity:   . Worried About Charity fundraiser in the Last Year:   . Arboriculturist in the Last Year:   Transportation Needs:   . Film/video editor (Medical):   [name]  Lack of Transportation (Non-Medical):   Physical Activity:   . Days of Exercise per Week:   . Minutes of Exercise per Session:   Stress:   . Feeling of Stress :   Social Connections:   . Frequency of Communication with Friends and Family:   . Frequency of Social Gatherings with Friends and Family:   . Attends Religious Services:   . Active Member of Clubs or Organizations:   . Attends Archivist Meetings:   [name] Marital Status:   Intimate Partner Violence:   . Fear of Current or Ex-Partner:   . Emotionally Abused:   [name] Physically Abused:   . Sexually Abused:     Family History:    Family History  Problem Relation Age of Onset  . Hypertension Mother   . Hyperlipidemia Mother   . Hypertension Sister   . Kidney disease Father   . Heart disease Brother 19       open heart surgery  . Colon cancer Neg Hx      ROS:  Please see the history of present illness.  Fatigue.  No fevers, chills, productive cough or hemoptysis.  No melena. All other ROS reviewed and negative.     Physical Exam/Data:   Vitals:   09/24/19 0300 09/24/19 0348 09/24/19 0420 09/24/19 0901  BP: (!) 166/83 (!) 158/99  (!) 152/99  Pulse: 96 98  88  Resp: (!) 30 20  (!) 21  Temp:  98 F (36.7 C)  98.2 F (36.8 C)  TempSrc:  Oral  Oral  SpO2: 96% 99%  92%  Weight:   61.5 kg     Intake/Output Summary (Last 24 hours) at 09/24/2019 1049 Last data filed at 09/24/2019 1039 Gross per 24 hour  Intake 640 ml  Output 775 ml  Net -135 ml   Last 3 Weights 09/24/2019 09/17/2019 09/14/2019  Weight (lbs) 135 lb 9.3 oz 136 lb 137 lb  Weight (kg) 61.5 kg 61.689 kg 62.143 kg     Body mass index is 21.88 kg/m.  General:  Well nourished, well developed, in no acute distress HEENT: normal Neck: no JVD Endocrine:  No thryomegaly Vascular: No carotid bruits; FA pulses 2+ bilaterally without bruits  Cardiac:  normal S1, S2; RRR; 2/6 systolic murmur apex Lungs:  Diminished BS bases bilaterally  Abd: soft,  nontender, no hepatomegaly  Ext: trace to 1+ edema Musculoskeletal:  No deformities, BUE and BLE strength normal and equal Skin: warm and dry  Neuro:  CNs 2-12 intact, no focal abnormalities noted Psych:  Normal affect   EKG:  The EKG was personally reviewed and demonstrates: Sinus rhythm with pacs and prolonged QT interval. Telemetry:  Telemetry was personally reviewed and demonstrates: Sinus with PVCs.  Laboratory Data:  High Sensitivity Troponin:   Recent Labs  Lab 09/24/19 0128 09/24/19 0331  TROPONINIHS 53* 61*     Chemistry Recent Labs  Lab 09/17/19  1727 09/23/19 2338 09/24/19 0501  NA 139 138 136  K 3.6 3.6 3.2*  CL 96 99 98  CO2 23 23 23   GLUCOSE 91 160* 145*  BUN 50* 65* 63*  CREATININE 2.24* 2.45* 2.39*  CALCIUM 9.7 9.2 9.0  GFRNONAA 28* 25* 26*  GFRAA 33* 29* 30*  ANIONGAP  --  16* 15    Hematology Recent Labs  Lab 09/23/19 2338 09/24/19 0501  WBC 13.1* 12.6*  RBC 3.38* 3.37*  HGB 8.7* 8.4*  HCT 28.7* 28.2*  MCV 84.9 83.7  MCH 25.7* 24.9*  MCHC 30.3 29.8*  RDW 18.0* 17.9*  PLT 409* 420*   BNP Recent Labs  Lab 09/17/19 1727 09/23/19 2338  BNP  --  1,723.7*  PROBNP 17,017*  --      Radiology/Studies:  DG Chest Port 1 View  Result Date: 09/23/2019 CLINICAL DATA:  Shortness of breath x3 weeks. EXAM: PORTABLE CHEST 1 VIEW COMPARISON:  September 17, 2019 FINDINGS: Persistent moderate severity areas of atelectasis and/or infiltrate are seen within the bilateral lung bases. Small, predominant stable bilateral pleural effusions are seen. There is no evidence of a pneumothorax. There is stable mild to moderate severity enlargement of the cardiac silhouette. The visualized skeletal structures are unremarkable. IMPRESSION: Persistent moderate severity areas of bibasilar atelectasis and/or infiltrate with small, stable bilateral pleural effusions. Electronically Signed   By: Virgina Norfolk M.D.   On: 09/23/2019 23:32    Assessment and Plan:   1. Acute  on chronic diastolic congestive heart failure-patient's increased symptoms correlate with recent reduction in diuretic.  BNP significantly elevated and pleural effusions on chest x-ray.  Also mildly volume overloaded on examination.  Would continue Lasix 40 mg IV twice daily and follow renal function closely.  Note patient does have a mitral regurgitation murmur on examination but I personally reviewed the patient's recent echocardiogram and MR was mild. 2. Chronic stage IV kidney disease-follow renal function closely with diuresis.  May need nephrology to see if renal function deteriorates with diuresis. 3. Anemia-likely secondary to renal insufficiency. 4. Hypertension-blood pressure is elevated.  We will follow and add additional medications as needed. 5. Irregular heart rhythm-based on history gastroenterology told patient that he should follow-up with cardiology for this issue.  I have none of those notes available.  Monitor is planned as an outpatient. 6. Hyperlipidemia-continue statin.  For questions or updates, please contact [facility] [place] Please consult [email or web address] for contact info under     Signed, Kirk Ruths, MD  09/24/2019 10:49 AM

## 2019-09-24 NOTE — ED Notes (Signed)
Placed on O2 2 L 

## 2019-09-25 ENCOUNTER — Telehealth: Payer: Self-pay | Admitting: Family Medicine

## 2019-09-25 ENCOUNTER — Telehealth: Payer: Medicare Other | Admitting: Internal Medicine

## 2019-09-25 ENCOUNTER — Telehealth: Payer: Self-pay | Admitting: Nurse Practitioner

## 2019-09-25 ENCOUNTER — Other Ambulatory Visit: Payer: Medicare Other

## 2019-09-25 LAB — BASIC METABOLIC PANEL
Anion gap: 14 (ref 5–15)
BUN: 60 mg/dL — ABNORMAL HIGH (ref 8–23)
CO2: 25 mmol/L (ref 22–32)
Calcium: 9.2 mg/dL (ref 8.9–10.3)
Chloride: 99 mmol/L (ref 98–111)
Creatinine, Ser: 2.49 mg/dL — ABNORMAL HIGH (ref 0.61–1.24)
GFR calc Af Amer: 29 mL/min — ABNORMAL LOW (ref >60)
GFR calc non Af Amer: 25 mL/min — ABNORMAL LOW (ref >60)
Glucose, Bld: 160 mg/dL — ABNORMAL HIGH (ref 70–99)
Potassium: 3.1 mmol/L — ABNORMAL LOW (ref 3.5–5.1)
Sodium: 138 mmol/L (ref 135–145)

## 2019-09-25 LAB — GLUCOSE, CAPILLARY
Glucose-Capillary: 144 mg/dL — ABNORMAL HIGH (ref 70–99)
Glucose-Capillary: 280 mg/dL — ABNORMAL HIGH (ref 70–99)
Glucose-Capillary: 228 mg/dL — ABNORMAL HIGH (ref 70–99)
Glucose-Capillary: 130 mg/dL — ABNORMAL HIGH (ref 70–99)
Glucose-Capillary: 188 mg/dL — ABNORMAL HIGH (ref 70–99)

## 2019-09-25 MED ORDER — DILTIAZEM HCL 30 MG PO TABS
30.0000 mg | ORAL_TABLET | Freq: Four times a day (QID) | ORAL | Status: DC
  Administered 2019-09-25 – 2019-09-26 (×4): 30 mg via ORAL
  Filled 2019-09-25 (×4): qty 1

## 2019-09-25 MED ORDER — ORAL CARE MOUTH RINSE
15.0000 mL | Freq: Two times a day (BID) | OROMUCOSAL | Status: DC
  Administered 2019-09-25 – 2019-09-26 (×3): 15 mL via OROMUCOSAL

## 2019-09-25 MED ORDER — POTASSIUM CHLORIDE CRYS ER 20 MEQ PO TBCR
40.0000 meq | EXTENDED_RELEASE_TABLET | Freq: Two times a day (BID) | ORAL | Status: DC
  Administered 2019-09-25 – 2019-09-26 (×3): 40 meq via ORAL
  Filled 2019-09-25 (×3): qty 2

## 2019-09-25 MED ORDER — METOPROLOL TARTRATE 5 MG/5ML IV SOLN
5.0000 mg | INTRAVENOUS | Status: AC | PRN
  Administered 2019-09-25: 5 mg via INTRAVENOUS
  Filled 2019-09-25: qty 5

## 2019-09-25 MED ORDER — POTASSIUM CHLORIDE CRYS ER 20 MEQ PO TBCR: 40.0000 meq | EXTENDED_RELEASE_TABLET | Freq: Every day | ORAL | Status: DC

## 2019-09-25 NOTE — Progress Notes (Signed)
Noted that pt had no urine in urinal since 2100 lasix admin. Pt said he had been going in the toilet. RN educated pt (wife as interpreter on the phone per pt preference) to use urinal from now on for staff to accurately measure urine output. Pt verbalizes understanding. Edema in bilateral extremities looks much improved compared to on admission.

## 2019-09-25 NOTE — Progress Notes (Signed)
PROGRESS NOTE  Adam Reid ERD:[id number] DOB: May 05, 1946 DOA: 09/23/2019 PCP: [name], MD   LOS: 1 day   Brief narrative: As per HPI,  Adam Reid is a 74 y.o. male with history of diastolic CHF, recently had 2D echo in February 2021 last month with EF of 55 to 60% with grade 3 diastolic dysfunction, who was diagnosed with COVID-19 infection in December 2020 presented to the hospital with increasing shortness of breath, peripheral edema on minimal exertion and orthopnea with paroxysmal nocturnal dyspnea over 4 weeks.  Had gone to cardiology recently and had a 2D echo which showed grade 3 diastolic dysfunction with EF of 55 to 60%.  Patient was initially placed on 80 mg p.o. Lasix for 3 days followed by 40 daily.  Last week patient had to go to the primary care office because of worsening shortness of breath and again his Lasix dose was increased for 3 days and back to 40 mg daily.   ED Course: In the ER, EKG shows normal sinus rhythm with prolonged QTC.  Chest x-ray showed bilateral possible congestion was infiltrates with pleural effusion.  BNP has markedly increased from baseline and was 1700. High-sensitivity troponins were 53 and 61, WBC count was 13.1. Creatinine at 2.4.  Patient also has been a worsening anemia and has been referred to her gastroenterologist as per the primary care's note.  Patient was given 80 mg IV in the ER and admitted for further management.  Assessment/Plan:  Principal Problem:   Acute CHF (congestive heart failure) (HCC) Active Problems:   BPH (benign prostatic hyperplasia)   Diabetes mellitus with renal manifestations, controlled (HCC)   Hypotension   ARF (acute renal failure) (HCC)   Anemia   Acute respiratory failure with hypoxia (HCC)   Acute on chronic diastolic congestive heart failure.  On Lasix 40 twice daily.    Continue strict intake and output charting.  Fluid restriction.  Cardiology on board.  On Lasix IV.  Plan is to change to  p.o. Lasix in a.m.  Continue aspirin Lipitor, and metoprolol.  Check BMP in a.m.  New onset atrial fibrillation.  Patient has been started on Cardizem p.o.  Will need anticoagulation but due to anemia will hold off.  GI is supposed to to do colonoscopy for the patient.  Acute respiratory failure with hypoxia likely secondary to CHF, anemia and a recent COVID-19 infection.    Currently off supplemental oxygen. Continue diuretics.  Acute kidney injury on chronic kidney disease stage IV.    No further increase in creatinine.  Will closely monitor on IV diuretics. Patient will need to follow up with his nephrologist as outpatient with [name].  Diabetes mellitus type 2.  Continue sliding scale insulin, Accu-Cheks.  Will closely monitor.  Essential hypertension.  Continue metoprolol.  Cardizem has been added.  History of BPH.  Continue Flomax.  COVID-19 infection December 2020.  Hyperlipidemia continue statins.  Debility, deconditioning. Will get PT evaluation. Patient's wife is concerned about his debility.   VTE Prophylaxis: Heparin subcu  Code Status: Full code  Family Communication: I spoke with the patient's wife and updated her about the clinical condition of the patient. Answer all the queries she had.   Disposition Plan:  . Patient is from home . Likely disposition to home in 1 to 2 days . Barriers to discharge: On IV diuretics for decompensated heart failure, debility and deconditioning  Consultants:  Cardiology  Procedures:  None  Antibiotics:  . None  Anti-infectives (From  admission, onward)   None     Subjective:  Today, patient was seen and examined at bedside.  Poor historian due to language barrier but denies overt shortness of breath,  dyspnea is improving.  Denies chest pain.  Objective: Vitals:   09/25/19 0542 09/25/19 0908  BP: 126/79 (!) 156/101  Pulse: (!) 106 (!) 105  Resp: 17 20  Temp: 98 F (36.7 C) 97.8 F (36.6 C)  SpO2: 98% 96%     Intake/Output Summary (Last 24 hours) at 09/25/2019 1212 Last data filed at 09/25/2019 1008 Gross per 24 hour  Intake --  Output 925 ml  Net -925 ml   Filed Weights   09/24/19 0420  Weight: 61.5 kg   Body mass index is 21.88 kg/m.   Physical Exam:  GENERAL: Patient is alert awake and oriented. Not in obvious distress.  Thinly built HENT: No scleral pallor or icterus. Pupils equally reactive to light. Oral mucosa is moist NECK: is supple, no gross swelling noted. CHEST: Clear to auscultation. No crackles or wheezes.  Diminished breath sounds bilaterally. CVS: S1 and S2 heard, irregular rhythm ABDOMEN: Soft, non-tender, bowel sounds are present. EXTREMITIES: Trace edema CNS: Cranial nerves are intact. No focal motor deficits. SKIN: warm and dry without rashes.  Data Review: I have personally reviewed the following laboratory data and studies,  CBC: Recent Labs  Lab 09/23/19 2338 09/24/19 0501  WBC 13.1* 12.6*  NEUTROABS 11.4*  --   HGB 8.7* 8.4*  HCT 28.7* 28.2*  MCV 84.9 83.7  PLT 409* 858*   Basic Metabolic Panel: Recent Labs  Lab 09/23/19 2338 09/24/19 0501 09/25/19 0545  NA 138 136 138  K 3.6 3.2* 3.1*  CL 99 98 99  CO2 23 23 25   GLUCOSE 160* 145* 160*  BUN 65* 63* 60*  CREATININE 2.45* 2.39* 2.49*  CALCIUM 9.2 9.0 9.2  MG  --  2.3  --    Liver Function Tests: No results for input(s): AST, ALT, ALKPHOS, BILITOT, PROT, ALBUMIN in the last 168 hours. No results for input(s): LIPASE, AMYLASE in the last 168 hours. No results for input(s): AMMONIA in the last 168 hours. Cardiac Enzymes: No results for input(s): CKTOTAL, CKMB, CKMBINDEX, TROPONINI in the last 168 hours. BNP (last 3 results) Recent Labs    08/16/19 1444 09/23/19 2338  BNP 734.2* 1,723.7*    ProBNP (last 3 results) Recent Labs    09/17/19 1727  PROBNP 17,017*    CBG: Recent Labs  Lab 09/24/19 0806 09/24/19 1159 09/24/19 1804 09/24/19 2219 09/25/19 0811  GLUCAP 188*  126* 222* 164* 144*   No results found for this or any previous visit (from the past 240 hour(s)).   Studies: DG Chest Port 1 View  Result Date: 09/23/2019 CLINICAL DATA:  Shortness of breath x3 weeks. EXAM: PORTABLE CHEST 1 VIEW COMPARISON:  September 17, 2019 FINDINGS: Persistent moderate severity areas of atelectasis and/or infiltrate are seen within the bilateral lung bases. Small, predominant stable bilateral pleural effusions are seen. There is no evidence of a pneumothorax. There is stable mild to moderate severity enlargement of the cardiac silhouette. The visualized skeletal structures are unremarkable. IMPRESSION: Persistent moderate severity areas of bibasilar atelectasis and/or infiltrate with small, stable bilateral pleural effusions. Electronically Signed   By: Virgina Norfolk M.D.   On: 09/23/2019 23:32      Flora Lipps, MD  [facility] 09/25/2019

## 2019-09-25 NOTE — Telephone Encounter (Signed)
Please review previous message and advise of next step in plan of care-can it be arranged that the patient completes these procedures while he is inpatient Thanks Bre

## 2019-09-25 NOTE — Telephone Encounter (Signed)
See attached message. I did speak with the pt's wife to let her know that she needs to contact the place where her husband is have the colonoscopy done t and to let them know that he is currently in the hospital.

## 2019-09-25 NOTE — Telephone Encounter (Signed)
Noted  

## 2019-09-25 NOTE — Telephone Encounter (Signed)
Please cancel the procedures( EGD and colonoscopy) scheduled for 3/18 as outpatient in [place].  Cardiology team can request inpatient GI consult if they feel he needs to be evaluated sooner while he is still hospitalized.  Thank you

## 2019-09-25 NOTE — Telephone Encounter (Signed)
Called and spoke with patient's wife- [name]-verified DPR- [name] given information from MD and is agreeable to plan of care; patient's appt for procedure at [facility] have been cancelled at this time; [name] advised to call back to the office at [phone] to rescheduled procedure once patient is "feeling better" and also if questions/concerns arise;  [name] verbalized understanding of information/instructions;

## 2019-09-25 NOTE — Telephone Encounter (Signed)
Pt's wife [name] called to inform that pt has been hospitalized due to CHF. She is not sure if he will be d/c before his procedure date on  3/18 with Dr. Silverio Decamp. Pls call her, she wants to know if pt can have procedure while he is hospitalized, she did not want to cancel pt;s procedure until she speaks with the nurse.

## 2019-09-25 NOTE — Telephone Encounter (Signed)
I do see notes from today with gastroenterology rescheduling his colonoscopy, and potential inpatient GI consult if needed. Progress note reviewed from inpatient today, deferring anticoagulation given history of anemia at this time.  Called patient's spouse, discussed current care plan and reviewed any questions she had.  Plan for hospital follow-up when he is discharged, but I am happy to answer any questions in the meantime. She does note that he is doing better and breathing better with diuresis.  All questions were answered.  14-minute call.

## 2019-09-25 NOTE — Progress Notes (Signed)
This RN got in report that pt's hr had gone up several times during the day and went right back down, non-sustained and MD aware. On this shift tele notified that pt's hr went into 170s svt. Pt then went into afib with rate in 90s-115. Vitals checked and wnl. Pt asymptomatic. Sharlet Salina, NP made aware. Lopressor 5mg  Iv ordered as scheduled.

## 2019-09-25 NOTE — Progress Notes (Signed)
Progress Note  Patient Name: Adam Reid Date of Encounter: 09/25/2019  Primary Cardiologist: Elouise Munroe, MD   Subjective   No CP; dyspnea improving  Inpatient Medications    Scheduled Meds: . aspirin EC  81 mg Oral QODAY  . atorvastatin  80 mg Oral Daily  . calcitRIOL  0.25 mcg Oral QODAY  . citalopram  20 mg Oral Daily  . furosemide  40 mg Intravenous Q12H  . heparin  5,000 Units Subcutaneous Q8H  . insulin aspart  0-9 Units Subcutaneous TID WC  . mouth rinse  15 mL Mouth Rinse BID  . metoprolol succinate  25 mg Oral Daily  . potassium chloride  40 mEq Oral BID  . tamsulosin  0.8 mg Oral Daily  . vitamin B-12  500 mcg Oral Daily   Continuous Infusions:  PRN Meds: acetaminophen **OR** acetaminophen, albuterol, nitroGLYCERIN, senna   Vital Signs    Vitals:   09/24/19 1239 09/24/19 1806 09/25/19 0126 09/25/19 0542  BP: (!) 147/98 128/87 136/90 126/79  Pulse: 85 (!) 145 86 (!) 106  Resp: (!) 21 18 16 17   Temp:  98.6 F (37 C) 98.3 F (36.8 C) 98 F (36.7 C)  TempSrc:  Oral Oral Oral  SpO2: 98% 97% 100% 98%  Weight:        Intake/Output Summary (Last 24 hours) at 09/25/2019 0843 Last data filed at 09/25/2019 0728 Gross per 24 hour  Intake 240 ml  Output 1300 ml  Net -1060 ml   Last 3 Weights 09/24/2019 09/17/2019 09/14/2019  Weight (lbs) 135 lb 9.3 oz 136 lb 137 lb  Weight (kg) 61.5 kg 61.689 kg 62.143 kg      Telemetry    Sinus- Personally Reviewed   Physical Exam   GEN: No acute distress.   Neck: supple Cardiac: irregular and tachycardic Respiratory: Clear to auscultation bilaterally. GI: Soft, nontender, non-distended  MS: trace edema Neuro:  Nonfocal  Psych: Normal affect   Labs    High Sensitivity Troponin:   Recent Labs  Lab 09/24/19 0128 09/24/19 0331  TROPONINIHS 53* 61*      Chemistry Recent Labs  Lab 09/23/19 2338 09/24/19 0501 09/25/19 0545  NA 138 136 138  K 3.6 3.2* 3.1*  CL 99 98 99  CO2 23 23 25     GLUCOSE 160* 145* 160*  BUN 65* 63* 60*  CREATININE 2.45* 2.39* 2.49*  CALCIUM 9.2 9.0 9.2  GFRNONAA 25* 26* 25*  GFRAA 29* 30* 29*  ANIONGAP 16* 15 14     Hematology Recent Labs  Lab 09/23/19 2338 09/24/19 0501  WBC 13.1* 12.6*  RBC 3.38* 3.37*  HGB 8.7* 8.4*  HCT 28.7* 28.2*  MCV 84.9 83.7  MCH 25.7* 24.9*  MCHC 30.3 29.8*  RDW 18.0* 17.9*  PLT 409* 420*    BNP Recent Labs  Lab 09/23/19 2338  BNP 1,723.7*     Radiology    DG Chest Port 1 View  Result Date: 09/23/2019 CLINICAL DATA:  Shortness of breath x3 weeks. EXAM: PORTABLE CHEST 1 VIEW COMPARISON:  September 17, 2019 FINDINGS: Persistent moderate severity areas of atelectasis and/or infiltrate are seen within the bilateral lung bases. Small, predominant stable bilateral pleural effusions are seen. There is no evidence of a pneumothorax. There is stable mild to moderate severity enlargement of the cardiac silhouette. The visualized skeletal structures are unremarkable. IMPRESSION: Persistent moderate severity areas of bibasilar atelectasis and/or infiltrate with small, stable bilateral pleural effusions. Electronically Signed   By: Hoover Browns  Houston M.D.   On: 09/23/2019 23:32    Patient Profile     74 y.o. male with past medical history of diabetes, hypertension, hyperlipidemia, chronic stage IV kidney disease, nonobstructive coronary disease, chronic diastolic congestive heart failure being evaluated for acute on chronic diastolic congestive heart failure.  Last echocardiogram February 2021 showed normal LV function, grade 3 diastolic dysfunction, moderate left atrial enlargement, mild mitral and tricuspid regurgitation.  Assessment & Plan    1 acute on chronic diastolic congestive heart failure-patient symptoms are improving.  Would continue Lasix at present dose today and we will transition to oral tomorrow.  2 paroxysmal atrial fibrillation-patient has now developed atrial fibrillation.  His heart rate is  elevated.  Add Cardizem 30 mg by mouth every 6 hours and increase as needed for rate control.CHADSvasc 4.  He will ultimately need apixaban.  However he has been anemic and is scheduled for a colonoscopy in 2 days.  Would not initiate until afterwards.  3 chronic stage IV kidney disease-continue to follow renal function closely with diuresis.  4 hypertension-blood pressure mildly elevated.  We are adding Cardizem both for heart rate and blood pressure.  5 hyperlipidemia-continue statin.  For questions or updates, please contact [place] Please consult [email or web address] for contact info under        Signed, Kirk Ruths, MD  09/25/2019, 8:43 AM

## 2019-09-25 NOTE — Telephone Encounter (Signed)
Dr. Silverio Decamp, pls see msg from wife. Pt was admitted to hospital with acute CHF. His anemia is stable for now. He is needing GI eval asap due to having afib and needing GI clearance prior to starting anticoagulation. He is scheduled to have an EGD/colonoscopy with you as an outpatient 3/18 which will need to be rescheduled. See wife's message. I would assess that these procedures could not be done during his hospitalization for acute CHF.  What are your recommendations.

## 2019-09-25 NOTE — Telephone Encounter (Signed)
Patient is wanting Dr.Greene know that patient is in hospital pt wants provider  to speak with Heart Doctor that is treating him in hospital  in [place] . Patient is on 4th floor room [phone] .wife wants to know  If colonoscopy needs to be rescheduled,   any questions [phone]

## 2019-09-26 ENCOUNTER — Other Ambulatory Visit: Payer: Self-pay | Admitting: Medical

## 2019-09-26 DIAGNOSIS — D649 Anemia, unspecified: Secondary | ICD-10-CM

## 2019-09-26 DIAGNOSIS — I5031 Acute diastolic (congestive) heart failure: Secondary | ICD-10-CM

## 2019-09-26 LAB — BASIC METABOLIC PANEL
Anion gap: 14 (ref 5–15)
BUN: 70 mg/dL — ABNORMAL HIGH (ref 8–23)
CO2: 27 mmol/L (ref 22–32)
Calcium: 9.2 mg/dL (ref 8.9–10.3)
Chloride: 96 mmol/L — ABNORMAL LOW (ref 98–111)
Creatinine, Ser: 2.7 mg/dL — ABNORMAL HIGH (ref 0.61–1.24)
GFR calc Af Amer: 26 mL/min — ABNORMAL LOW (ref >60)
GFR calc non Af Amer: 23 mL/min — ABNORMAL LOW (ref >60)
Glucose, Bld: 291 mg/dL — ABNORMAL HIGH (ref 70–99)
Potassium: 3.5 mmol/L (ref 3.5–5.1)
Sodium: 137 mmol/L (ref 135–145)

## 2019-09-26 LAB — CBC
HCT: 26.2 % — ABNORMAL LOW (ref 39.0–52.0)
Hemoglobin: 7.9 g/dL — ABNORMAL LOW (ref 13.0–17.0)
MCH: 25.6 pg — ABNORMAL LOW (ref 26.0–34.0)
MCHC: 30.2 g/dL (ref 30.0–36.0)
MCV: 85.1 fL (ref 80.0–100.0)
Platelets: 375 10*3/uL (ref 150–400)
RBC: 3.08 MIL/uL — ABNORMAL LOW (ref 4.22–5.81)
RDW: 17.9 % — ABNORMAL HIGH (ref 11.5–15.5)
WBC: 8.4 10*3/uL (ref 4.0–10.5)
nRBC: 0 % (ref 0.0–0.2)

## 2019-09-26 LAB — GLUCOSE, CAPILLARY
Glucose-Capillary: 213 mg/dL — ABNORMAL HIGH (ref 70–99)
Glucose-Capillary: 160 mg/dL — ABNORMAL HIGH (ref 70–99)

## 2019-09-26 MED ORDER — FUROSEMIDE 40 MG PO TABS: 60.0000 mg | ORAL_TABLET | Freq: Every day | ORAL | Status: DC

## 2019-09-26 MED ORDER — FUROSEMIDE 20 MG PO TABS: 60.0000 mg | ORAL_TABLET | Freq: Every day | ORAL | 0 refills | Status: DC

## 2019-09-26 MED ORDER — DILTIAZEM HCL ER COATED BEADS 180 MG PO CP24
180.0000 mg | ORAL_CAPSULE | Freq: Every day | ORAL | Status: DC
  Administered 2019-09-26: 180 mg via ORAL
  Filled 2019-09-26: qty 1

## 2019-09-26 MED ORDER — DILTIAZEM HCL ER COATED BEADS 180 MG PO CP24: 180.0000 mg | ORAL_CAPSULE | Freq: Every day | ORAL | 2 refills | Status: DC

## 2019-09-26 MED ORDER — POTASSIUM CHLORIDE CRYS ER 20 MEQ PO TBCR: EXTENDED_RELEASE_TABLET | ORAL | 0 refills | Status: DC

## 2019-09-26 NOTE — Discharge Summary (Signed)
.   Physician Discharge Summary  Adam Reid YSA:[id number] DOB: 21-Sep-1945 DOA: 09/23/2019  PCP: Adam Agreste, MD  Admit date: 09/23/2019 Discharge date: 09/26/2019  Admitted From: Home  Discharge disposition: Home  Recommendations for Outpatient Follow-Up:   . Follow up with your primary care provider in one week for regular checkup..  . Check CBC, BMP in the next visit. . Follow-up with cardiology as has been scheduled.  Anticoagulation is on hold due to planned outpatient colonoscopy and anemia.. . Follow-up with GI for colonoscopy as outpatient.  Discharge Diagnosis:   Principal Problem:   Acute CHF (congestive heart failure) (HCC) Active Problems:   BPH (benign prostatic hyperplasia)   Diabetes mellitus with renal manifestations, controlled (HCC)   Hypotension   ARF (acute renal failure) (HCC)   Anemia   Acute respiratory failure with hypoxia ([place])  Discharge Condition: Improved.  Diet recommendation: Low sodium, heart healthy.  Carbohydrate-modified.  Fluid restriction 1500 mL/day  Wound care: None.  Code status: Full.   History of Present Illness:   Adam Reid a 74 y.o.malewithhistory of diastolic CHF, recently had 2D echo in February 2021 last month with EF of 55 to 60% with grade 3 diastolic dysfunction, who was diagnosed with COVID-19 infection in December 2020 presented to the hospital with increasing shortness of breath, peripheral edema, dyspnea on minimal exertion and orthopnea with paroxysmal nocturnal dyspnea over 4 weeks. Had gone to cardiology recently and had a 2D echo which showed grade 3 diastolic dysfunction with EF of 55 to 60%. Patient was initially placed on 80 mg p.o. Lasix for 3 days followed by 40 daily. Last week patient had to go to the primary care office because of worsening shortness of breath and again his Lasix dose was increased for 3 days and back to 40 mg daily.   ED Course:In the ER, EKG shows normal sinus  rhythm with prolonged QTC. Chest x-ray showed bilateral possible congestion was infiltrates with pleural effusion. BNP has markedly increased from baseline and was 1700. High-sensitivity troponins were 53 and 61, WBC count was 13.1. Creatinine at 2.4. Patient also has been a worsening anemia and has been referred to her gastroenterologist as per the primary care's note. Patient was given 80 mg IV in the ER and admitted for further management.  Hospital Course:   Following conditions were addressed during hospitalization as listed below,  Acute on chronic diastolic congestive heart failure. Patient was a put on IV Lasix with significant improvement in his symptoms including edema.  Cardiology followed the patient during hospitalization.  Lasix dose has been changed to 60 mg from 80 mg.  Patient will continue fluid restriction on discharge.  Patient will have to follow-up with cardiology Dr. Margaretann Loveless in 1 to 2 weeks.  Continue aspirin, Lipitor, and metoprolol.  Check BMP in a.m.  New onset atrial fibrillation.   Cardizem p.o on discharge.  Continue metoprolol.   Currently normal sinus rhythm.  Will need anticoagulation but due to anemia will hold off till GI evaluation..  GI is supposed to to do colonoscopy for the patient as outpatient.  GI is aware of this and will reschedule patient for colonoscopy.  Acute respiratory failure with hypoxia likely secondary to CHF, anemia and a recent COVID-19 infection.     Resolved.  Currently off supplemental oxygen.Continue diuretics.  Patient ambulated well without supplemental oxygen.  Acute kidney injury on chronic kidney disease stage IV.   Patient will need to follow up with his nephrologist as outpatient  with [name].  Patient will be on 60 mg of Lasix on discharge.  Hypokalemia.  Patient was given potassium supplements during hospitalization.  We will continue for the next 10 days.  Will need to check CBC, BMP as outpatient to monitor for renal  function and electrolytes.  Diabetes mellitus type 2. Was emphasized on diabetic diet.  Continue glipizide and Actos on discharge.  Essential hypertension. Continue metoprolol and Cardizem.  Will need to monitor blood pressure as outpatient.  History of BPH. Continue Flomax.  COVID-19 infection December 2020.  Hyperlipidemia continue statins.  Debility, deconditioning.   Patient was able to ambulate independently.  Disposition.  At this time, patient is stable for disposition home.  Patient will have to follow-up with cardiology GI as outpatient.  Spoke with the patient's wife in detail regarding the plan for disposition and follow-up.  Medical Consultants:    Cardiology  Procedures:    None Subjective:   Today, patient.  Denies dyspnea.  Has less shortness of breath.  Was able to ambulate without oxygen.  Discharge Exam:   Vitals:   09/26/19 1001 09/26/19 1347  BP: 122/66 103/60  Pulse: 93 77  Resp:    Temp: 98.4 F (36.9 C) 98.1 F (36.7 C)  SpO2: 93% 98%   Vitals:   09/26/19 0500 09/26/19 0616 09/26/19 1001 09/26/19 1347  BP:  131/85 122/66 103/60  Pulse:  84 93 77  Resp:  14    Temp:  97.8 F (36.6 C) 98.4 F (36.9 C) 98.1 F (36.7 C)  TempSrc:  Oral Oral Oral  SpO2:  99% 93% 98%  Weight: 58.8 kg     Height:       General: Alert awake, not in obvious distress, thinly built HENT: pupils equally reacting to light,  No scleral pallor or icterus noted. Oral mucosa is moist.  Chest:  Clear breath sounds.  Diminished breath sounds bilaterally. No crackles or wheezes.  CVS: S1 &S2 heard. No murmur.  Regular rate and rhythm. Abdomen: Soft, nontender, nondistended.  Bowel sounds are heard.   Extremities: No cyanosis, clubbing . Trace edema. Peripheral pulses are palpable. Psych: Alert, awake and oriented, normal mood CNS:  No cranial nerve deficits.  Power equal in all extremities.   Skin: Warm and dry.  No rashes noted.  The results of significant  diagnostics from this hospitalization (including imaging, microbiology, ancillary and laboratory) are listed below for reference.     Diagnostic Studies:   DG Chest Port 1 View  Result Date: 09/23/2019 CLINICAL DATA:  Shortness of breath x3 weeks. EXAM: PORTABLE CHEST 1 VIEW COMPARISON:  September 17, 2019 FINDINGS: Persistent moderate severity areas of atelectasis and/or infiltrate are seen within the bilateral lung bases. Small, predominant stable bilateral pleural effusions are seen. There is no evidence of a pneumothorax. There is stable mild to moderate severity enlargement of the cardiac silhouette. The visualized skeletal structures are unremarkable. IMPRESSION: Persistent moderate severity areas of bibasilar atelectasis and/or infiltrate with small, stable bilateral pleural effusions. Electronically Signed   By: Virgina Norfolk M.D.   On: 09/23/2019 23:32    Labs:   Basic Metabolic Panel: Recent Labs  Lab 09/23/19 2338 09/23/19 2338 09/24/19 0501 09/24/19 0501 09/25/19 0545 09/26/19 0916  NA 138  --  136  --  138 137  K 3.6   < > 3.2*   < > 3.1* 3.5  CL 99  --  98  --  99 96*  CO2 23  --  23  --  25 27  GLUCOSE 160*  --  145*  --  160* 291*  BUN 65*  --  63*  --  60* 70*  CREATININE 2.45*  --  2.39*  --  2.49* 2.70*  CALCIUM 9.2  --  9.0  --  9.2 9.2  MG  --   --  2.3  --   --   --    < > = values in this interval not displayed.   GFR Estimated Creatinine Clearance: 20.6 mL/min (A) (by C-G formula based on SCr of 2.7 mg/dL (H)). Liver Function Tests: No results for input(s): AST, ALT, ALKPHOS, BILITOT, PROT, ALBUMIN in the last 168 hours. No results for input(s): LIPASE, AMYLASE in the last 168 hours. No results for input(s): AMMONIA in the last 168 hours. Coagulation profile No results for input(s): INR, PROTIME in the last 168 hours.  CBC: Recent Labs  Lab 09/23/19 2338 09/24/19 0501 09/26/19 0916  WBC 13.1* 12.6* 8.4  NEUTROABS 11.4*  --   --   HGB 8.7* 8.4*  7.9*  HCT 28.7* 28.2* 26.2*  MCV 84.9 83.7 85.1  PLT 409* 420* 375   Cardiac Enzymes: No results for input(s): CKTOTAL, CKMB, CKMBINDEX, TROPONINI in the last 168 hours. BNP: Invalid input(s): POCBNP CBG: Recent Labs  Lab 09/25/19 1244 09/25/19 1608 09/25/19 2113 09/26/19 0837 09/26/19 1235  GLUCAP 228* 130* 188* 213* 160*   D-Dimer No results for input(s): DDIMER in the last 72 hours. Hgb A1c No results for input(s): HGBA1C in the last 72 hours. Lipid Profile No results for input(s): CHOL, HDL, LDLCALC, TRIG, CHOLHDL, LDLDIRECT in the last 72 hours. Thyroid function studies Recent Labs    09/24/19 0501  TSH 3.691   Anemia work up No results for input(s): VITAMINB12, FOLATE, FERRITIN, TIBC, IRON, RETICCTPCT in the last 72 hours. Microbiology No results found for this or any previous visit (from the past 240 hour(s)).   Discharge Instructions:   Discharge Instructions    Call MD for:   Complete by: As directed    Worsening symptoms.   Diet - low sodium heart healthy   Complete by: As directed    Diet Carb Modified   Complete by: As directed    Discharge instructions   Complete by: As directed    Your Lasix dose has been decreased to 60 mg daily.  Please follow-up with Dr.Acharya your cardiologist in 1 to 2 weeks.  Continue fluid restriction to 1500 mL/day.  Please follow-up with your your GI clinic for colonoscopy evaluation.  You will need to get on blood thinners after the GI work-up.   Increase activity slowly   Complete by: As directed      Allergies as of 09/26/2019   No Known Allergies     Medication List    TAKE these medications   albuterol 108 (90 Base) MCG/ACT inhaler Commonly known as: VENTOLIN HFA Inhale 2 puffs into the lungs every 6 (six) hours as needed for wheezing or shortness of breath.   aspirin 81 MG EC tablet Take 1 tablet (81 mg total) by mouth daily. What changed: when to take this   atorvastatin 80 MG tablet Commonly known  as: LIPITOR TAKE 1 TABLET(80 MG) BY MOUTH DAILY What changed:   how much to take  how to take this  when to take this  additional instructions   blood glucose meter kit and supplies Use up to two times daily as directed  ICD10 E10.9 E11.9   calcitRIOL  0.25 MCG capsule Commonly known as: ROCALTROL Take 0.25 mcg by mouth every other day.   citalopram 20 MG tablet Commonly known as: CELEXA TAKE 1 TABLET(20 MG) BY MOUTH DAILY What changed:   how much to take  how to take this  when to take this   diltiazem 180 MG 24 hr capsule Commonly known as: CARDIZEM CD Take 1 capsule (180 mg total) by mouth daily. Start taking on: September 27, 2019   furosemide 20 MG tablet Commonly known as: Lasix Take 3 tablets (60 mg total) by mouth daily. What changed:   medication strength  how much to take   glipiZIDE 10 MG 24 hr tablet Commonly known as: GLUCOTROL XL TAKE 1 TABLET(10 MG) BY MOUTH DAILY WITH BREAKFAST What changed: See the new instructions.   GlucoCom Lancets Misc Use for home glucose monitoring   metoprolol succinate 25 MG 24 hr tablet Commonly known as: TOPROL-XL Take 1 tablet (25 mg total) by mouth daily.   nitroGLYCERIN 0.4 MG SL tablet Commonly known as: NITROSTAT Place 1 tablet (0.4 mg total) under the tongue every 5 (five) minutes x 3 doses as needed for chest pain.   OneTouch Verio test strip Generic drug: glucose blood Test up to 2 times per day.  Uncontrolled diabetes with hyperglycemia and stage 4 CKD.   OneTouch Verio w/Device Kit as directed.   pioglitazone 30 MG tablet Commonly known as: ACTOS TAKE 1 TABLET(30 MG) BY MOUTH DAILY What changed:   how much to take  how to take this  when to take this  additional instructions   polyethylene glycol 17 g packet Commonly known as: MIRALAX / GLYCOLAX Take 17 g by mouth daily as needed for mild constipation or moderate constipation.   potassium chloride SA 20 MEQ tablet Commonly known as:  KLOR-CON Take 20 meq daily for 3 days only   senna 8.6 MG tablet Commonly known as: SENOKOT Take 1 tablet by mouth as needed for constipation.   sitaGLIPtin 25 MG tablet Commonly known as: Januvia Take 1 tablet (25 mg total) by mouth daily.   tamsulosin 0.4 MG Caps capsule Commonly known as: FLOMAX TAKE 2 CAPSULES(0.8 MG) BY MOUTH DAILY What changed:   how much to take  how to take this  when to take this  additional instructions   vitamin B-12 500 MCG tablet Commonly known as: CYANOCOBALAMIN Take 500 mcg by mouth daily. Reported on 12/02/2015   VITAMIN D-3 PO Take 1 tablet by mouth daily.      Follow-up Information    Elouise Munroe, MD Follow up on 10/08/2019.   Specialties: Cardiology, Radiology Why: Hospital follow-up March 29th at 11:40 AM Contact information: [address] [place] [address] [place] [address] [phone]        [facility] Follow up on 10/03/2019.   Specialty: Cardiology Why: Please go to [facility] office March 24th for blood work. Walk in 7:30 to 4:30 Contact information: [place]       Adam Agreste, MD Follow up in 2 week(s).   Specialties: Family Medicine, Sports Medicine Why: regular check up Contact information: [place] [address] [phone]           Time coordinating discharge: 39 minutes  Signed:  Lakin Rhine  [facility] 09/26/2019, 1:57 PM

## 2019-09-26 NOTE — Evaluation (Signed)
Physical Therapy Evaluation Patient Details Name: Adam Reid MRN: [id number] DOB: 10-19-1945 Today's Date: 09/26/2019   History of Present Illness  74 year old male admitted with Acute CHF; PMH includes EF 60%, had Covid 56 in December 2020, peripheral edema, DM, hypotension, ARF and anemia.  Clinical Impression  Patient in room, ambulating to bathroom when PT arrived. Spouse presents and visiting at bedside. He ambulates with no AD and no LOB. She interpreted as he speaks little [place]. They live in one level home with no outside steps, commode is elevated, tub-shower combo with grab bars, shower head is HH. In PLOF he was independent in all ADLs, IADls including driving. No history of falls. No AD for ambulation. Instructed in ex format, and printed sheet issued = good return demo. Spouse interpreted HEP. No need for ongoing PT noted and physician has actually discharged him- RN to come to room when PT finished to go over discharge papers.     Follow Up Recommendations No PT follow up    Equipment Recommendations       Recommendations for Other Services       Precautions / Restrictions Precautions Precautions: None Precaution Comments: He is independently ambulatory with no AD throughout room with no LOB. He is to be discharged later this afternoon. Restrictions Weight Bearing Restrictions: No      Mobility  Bed Mobility Overal bed mobility: Independent                Transfers Overall transfer level: Independent Equipment used: None                Ambulation/Gait Ambulation/Gait assistance: Independent Gait Distance (Feet): 43 Feet Assistive device: None Gait Pattern/deviations: WFL(Within Functional Limits)   Gait velocity interpretation: >2.62 ft/sec, indicative of community ambulatory    Stairs            Wheelchair Mobility    Modified Rankin (Stroke Patients Only)       Balance Overall balance assessment: Independent                                            Pertinent Vitals/Pain Pain Assessment: No/denies pain    Home Living Family/patient expects to be discharged to:: Private residence Living Arrangements: Spouse/significant other Available Help at Discharge: Family Type of Home: House       Home Layout: One level Home Equipment: Hand held shower head;Grab bars - tub/shower Additional Comments: He was independent in all bathing and dressing. Speaks little English and spouse was present to interpret.    Prior Function Level of Independence: Independent         Comments: Independent in ADLs, IADLs including driving.     Hand Dominance   Dominant Hand: Right    Extremity/Trunk Assessment        Lower Extremity Assessment Lower Extremity Assessment: Generalized weakness    Cervical / Trunk Assessment Cervical / Trunk Assessment: Normal  Communication   Communication: Interpreter utilized(Spouse interpreted)  Cognition Arousal/Alertness: Awake/alert Behavior During Therapy: WFL for tasks assessed/performed Overall Cognitive Status: Within Functional Limits for tasks assessed                                 General Comments: Ambulated 45 feet with no LOB, and no AD.      General Comments  General comments (skin integrity, edema, etc.): Fair tone and turgor    Exercises General Exercises - Lower Extremity Ankle Circles/Pumps: AROM;Seated;10 reps;Both Quad Sets: AROM;Seated;10 reps;Both Gluteal Sets: AROM;Seated;10 reps Short Arc Quad: AROM;Seated;Both;10 reps Heel Slides: AROM;Seated;10 reps;Both Hip ABduction/ADduction: AROM;Seated;Both;10 reps Other Exercises Other Exercises: Printed ex sheet was issued , spouse had interpreted throughout, good return demo.   Assessment/Plan    PT Assessment Patent does not need any further PT services  PT Problem List         PT Treatment Interventions      PT Goals (Current goals can be found in the  Care Plan section)  Acute Rehab PT Goals Patient Stated Goal: Wants to go home, and grateful that the RN said today PT Goal Formulation: With patient(Completed assessment and HEP, printed sheet issued) Time For Goal Achievement: 09/26/19 Potential to Achieve Goals: Good    Frequency     Barriers to discharge        Co-evaluation               AM-PAC PT "6 Clicks" Mobility  Outcome Measure Help needed turning from your back to your side while in a flat bed without using bedrails?: None Help needed moving from lying on your back to sitting on the side of a flat bed without using bedrails?: None Help needed moving to and from a bed to a chair (including a wheelchair)?: None Help needed standing up from a chair using your arms (e.g., wheelchair or bedside chair)?: None Help needed to walk in hospital room?: None Help needed climbing 3-5 steps with a railing? : None 6 Click Score: 24    End of Session   Activity Tolerance: Patient tolerated treatment well Patient left: in chair;with call bell/phone within reach Nurse Communication: Mobility status(Nurse indicated that he definitely was being discharged after PT session complete) PT Visit Diagnosis: Muscle weakness (generalized) (M62.81)    Time: 4656-8127 PT Time Calculation (min) (ACUTE ONLY): 38 min   Charges:     PT Treatments $Therapeutic Exercise: 8-22 mins       Rollen Sox, PT # [phone] CGV cell  Casandra Doffing 09/26/2019, 4:00 PM

## 2019-09-26 NOTE — Progress Notes (Addendum)
Progress Note  Patient Name: Adam Reid Date of Encounter: 09/26/2019  Primary Cardiologist: Elouise Munroe, MD   Subjective   Patient feels breathing is better. He is in sinus with frequent ectopy. No chest pain. Patient put out 1.2L overnight  Inpatient Medications    Scheduled Meds: . aspirin EC  81 mg Oral QODAY  . atorvastatin  80 mg Oral Daily  . calcitRIOL  0.25 mcg Oral QODAY  . citalopram  20 mg Oral Daily  . diltiazem  30 mg Oral Q6H  . furosemide  40 mg Intravenous Q12H  . heparin  5,000 Units Subcutaneous Q8H  . insulin aspart  0-9 Units Subcutaneous TID WC  . mouth rinse  15 mL Mouth Rinse BID  . metoprolol succinate  25 mg Oral Daily  . potassium chloride  40 mEq Oral BID  . tamsulosin  0.8 mg Oral Daily  . vitamin B-12  500 mcg Oral Daily   Continuous Infusions:  PRN Meds: acetaminophen **OR** acetaminophen, albuterol, nitroGLYCERIN, senna   Vital Signs    Vitals:   09/25/19 2134 09/26/19 0247 09/26/19 0500 09/26/19 0616  BP:  113/70  131/85  Pulse: 88 79  84  Resp: 20 18  14   Temp:  98.6 F (37 C)  97.8 F (36.6 C)  TempSrc:  Oral  Oral  SpO2:  99%  99%  Weight:   58.8 kg   Height:        Intake/Output Summary (Last 24 hours) at 09/26/2019 0828 Last data filed at 09/26/2019 0960 Gross per 24 hour  Intake 480 ml  Output 1205 ml  Net -725 ml   Last 3 Weights 09/26/2019 09/24/2019 09/17/2019  Weight (lbs) 129 lb 10.1 oz 135 lb 9.3 oz 136 lb  Weight (kg) 58.8 kg 61.5 kg 61.689 kg      Telemetry    NSR with PACs and PVCs; HR 80s - Personally Reviewed  ECG    No new - Personally Reviewed  Physical Exam   GEN: No acute distress.   Neck: + JVD Cardiac: RRR, no murmurs, rubs, or gallops.  Respiratory: minimal crackles left base GI: Soft, nontender, non-distended  MS: No edema; No deformity. Neuro:  Nonfocal  Psych: Normal affect   Labs    High Sensitivity Troponin:   Recent Labs  Lab 09/24/19 0128 09/24/19 0331    TROPONINIHS 53* 61*      Chemistry Recent Labs  Lab 09/23/19 2338 09/24/19 0501 09/25/19 0545  NA 138 136 138  K 3.6 3.2* 3.1*  CL 99 98 99  CO2 23 23 25   GLUCOSE 160* 145* 160*  BUN 65* 63* 60*  CREATININE 2.45* 2.39* 2.49*  CALCIUM 9.2 9.0 9.2  GFRNONAA 25* 26* 25*  GFRAA 29* 30* 29*  ANIONGAP 16* 15 14     Hematology Recent Labs  Lab 09/23/19 2338 09/24/19 0501  WBC 13.1* 12.6*  RBC 3.38* 3.37*  HGB 8.7* 8.4*  HCT 28.7* 28.2*  MCV 84.9 83.7  MCH 25.7* 24.9*  MCHC 30.3 29.8*  RDW 18.0* 17.9*  PLT 409* 420*    BNP Recent Labs  Lab 09/23/19 2338  BNP 1,723.7*     DDimer No results for input(s): DDIMER in the last 168 hours.   Radiology    No results found.  Cardiac Studies   Echo 08/17/19 1. Left ventricular ejection fraction, by visual estimation, is 55 to  60%. The left ventricle has normal function. There is mildly increased  left ventricular  hypertrophy.  2. Elevated left atrial pressure.  3. Left ventricular diastolic parameters are consistent with Grade III  diastolic dysfunction (restrictive).  4. The left ventricle has no regional wall motion abnormalities.  5. Global right ventricle has normal systolic function.The right  ventricular size is normal.  6. Left atrial size was moderately dilated.  7. Right atrial size was normal.  8. Trivial pericardial effusion is present.  9. The mitral valve is normal in structure. Mild mitral valve  regurgitation. No evidence of mitral stenosis.  10. The tricuspid valve is normal in structure. Tricuspid valve  regurgitation is mild.  11. The aortic valve is tricuspid. Aortic valve regurgitation is not  visualized. No evidence of aortic valve sclerosis or stenosis.  12. The pulmonic valve was normal in structure. Pulmonic valve  regurgitation is trivial.  13. Mildly elevated pulmonary artery systolic pressure.  14. The inferior vena cava is normal in size with greater than 50%  respiratory  variability, suggesting right atrial pressure of 3 mmHg.  15. Normal LV systolic function; mild LVH; restrictive filling; moderate  LAE; mild MR.   Patient Profile     74 y.o. male with past medical history of diabetes, hypertension, hyperlipidemia, chronic stage IV kidney disease, nonobstructive coronary disease, chronic diastolic congestive heart failure being evaluated for acute heart failure.    Assessment & Plan   Acute on chronic diastolic HF - Echo 07/19/82 55-60%, G3DD, no wall motion abnormalities - Has been on IV lasix 40 mg BID. plan to start oral lasix today, likely 40 mg daily - yesterday potassium 3.1. He is on 70mEq BID. Goal >4.  - creatinine 2.39>2.49. AM labs pending - Patient feels breathing is better today - Put our 1.2L urine  overnight - weight down 135>129lbs  Paroxysmal Afib - new dx this admission - CHADSVASC = 4 ( age, CHF, HTN, DM) - With anemia plan to hold on anticoagulation until after GI procedure - He is in sinus with frequent PACs   CKD stage IV - creatinine 2.39>2.49. AM labs pending - Baseline around 2.2 - will need to continue to follow with nephrologist  HLD - continue atorvastatin 80 mg daily  - LDL 98 12/2018 needs follow up labs  HTN - Toprol-XL 25 mg BID - started on Cardizem 30 mg Q6H>>consolidate to 120mg  daily - pressures improved  Anemia - GI consulted, plan for EGD/colonoscopy 3/18 - Hgb 8.4 on 3/15  For questions or updates, please contact [place] Please consult [email or web address] for contact info under   Signed, Cadence Ninfa Meeker, PA-C  09/26/2019, 8:28 AM  As above, patient seen and examined.  History is obtained with the assistance of his wife.  He denies dyspnea or chest pain and feels much better.  He is back in sinus rhythm.  His volume status appears to be improved.  I will change Lasix to 60 mg by mouth daily.  Change Cardizem to CD 180 mg daily.  Patient can be discharged from cardiac standpoint.  Check potassium and  renal function in 1 week.  He will need follow-up with nephrology.  He also needs follow-up with gastroenterology.  He will ultimately require anticoagulation for paroxysmal atrial fibrillation but needs GI evaluation first.  Follow-up with Dr. Margaretann Loveless 1-2 weeks following DC. Please call with questions. Kirk Ruths

## 2019-09-27 ENCOUNTER — Encounter: Payer: Medicare Other | Admitting: Gastroenterology

## 2019-09-28 ENCOUNTER — Ambulatory Visit (INDEPENDENT_AMBULATORY_CARE_PROVIDER_SITE_OTHER): Payer: Medicare Other | Admitting: Otolaryngology

## 2019-10-01 ENCOUNTER — Ambulatory Visit
Admit: 2019-10-01 | Discharge: 2019-10-01 | Disposition: A | Discharge status: Home / Self Care | Payer: Medicare Other | Attending: Family Medicine | Admitting: Family Medicine

## 2019-10-01 DIAGNOSIS — R221 Localized swelling, mass and lump, neck: Secondary | ICD-10-CM

## 2019-10-02 ENCOUNTER — Encounter: Payer: Self-pay | Admitting: Family Medicine

## 2019-10-03 ENCOUNTER — Other Ambulatory Visit: Payer: Self-pay

## 2019-10-03 ENCOUNTER — Other Ambulatory Visit: Payer: Self-pay | Admitting: Family Medicine

## 2019-10-03 DIAGNOSIS — K118 Other diseases of salivary glands: Secondary | ICD-10-CM

## 2019-10-03 DIAGNOSIS — I5031 Acute diastolic (congestive) heart failure: Secondary | ICD-10-CM

## 2019-10-03 LAB — BASIC METABOLIC PANEL
BUN/Creatinine Ratio: 24 (ref 10–24)
BUN: 73 mg/dL — ABNORMAL HIGH (ref 8–27)
CO2: 21 mmol/L (ref 20–29)
Calcium: 9.1 mg/dL (ref 8.6–10.2)
Chloride: 97 mmol/L (ref 96–106)
Creatinine, Ser: 3.05 mg/dL — ABNORMAL HIGH (ref 0.76–1.27)
GFR calc Af Amer: 22 mL/min/{1.73_m2} — ABNORMAL LOW (ref >59)
GFR calc non Af Amer: 19 mL/min/{1.73_m2} — ABNORMAL LOW (ref >59)
Glucose: 137 mg/dL — ABNORMAL HIGH (ref 65–99)
Potassium: 4.1 mmol/L (ref 3.5–5.2)
Sodium: 135 mmol/L (ref 134–144)

## 2019-10-03 NOTE — Telephone Encounter (Signed)
Referred to ENT - see result note.

## 2019-10-03 NOTE — Telephone Encounter (Signed)
Pt received imagine results looks like DG chest on 09/23/2019 and they are now requesting ENT referral. Is this acceptable?

## 2019-10-04 ENCOUNTER — Other Ambulatory Visit: Payer: Self-pay | Admitting: *Deleted

## 2019-10-07 ENCOUNTER — Other Ambulatory Visit: Payer: Self-pay | Admitting: Family Medicine

## 2019-10-07 DIAGNOSIS — E1122 Type 2 diabetes mellitus with diabetic chronic kidney disease: Secondary | ICD-10-CM

## 2019-10-08 ENCOUNTER — Emergency Department (HOSPITAL_COMMUNITY)
Admission: EM | Admit: 2019-10-08 | Discharge: 2019-10-08 | Disposition: A | Discharge status: Home / Self Care | Payer: Medicare Other | Attending: Emergency Medicine | Admitting: Emergency Medicine

## 2019-10-08 ENCOUNTER — Other Ambulatory Visit: Payer: Self-pay

## 2019-10-08 ENCOUNTER — Ambulatory Visit (INDEPENDENT_AMBULATORY_CARE_PROVIDER_SITE_OTHER): Payer: Medicare Other | Admitting: Internal Medicine

## 2019-10-08 ENCOUNTER — Encounter: Payer: Self-pay | Admitting: Internal Medicine

## 2019-10-08 ENCOUNTER — Ambulatory Visit: Payer: Medicare Other | Admitting: Internal Medicine

## 2019-10-08 VITALS — BP 124/66 | HR 86 | Temp 97.0°F | Ht 66.0 in | Wt 149.0 lb

## 2019-10-08 DIAGNOSIS — Z79899 Other long term (current) drug therapy: Secondary | ICD-10-CM

## 2019-10-08 DIAGNOSIS — I10 Essential (primary) hypertension: Secondary | ICD-10-CM | POA: Diagnosis not present

## 2019-10-08 DIAGNOSIS — I5033 Acute on chronic diastolic (congestive) heart failure: Secondary | ICD-10-CM | POA: Diagnosis not present

## 2019-10-08 DIAGNOSIS — I480 Paroxysmal atrial fibrillation: Secondary | ICD-10-CM

## 2019-10-08 DIAGNOSIS — R0602 Shortness of breath: Secondary | ICD-10-CM | POA: Insufficient documentation

## 2019-10-08 DIAGNOSIS — R7989 Other specified abnormal findings of blood chemistry: Secondary | ICD-10-CM | POA: Diagnosis not present

## 2019-10-08 DIAGNOSIS — I509 Heart failure, unspecified: Secondary | ICD-10-CM | POA: Insufficient documentation

## 2019-10-08 NOTE — Progress Notes (Signed)
Cardiology Office Note:    Date:  10/08/2019   ID:  Adam Reid, DOB 01/25/1946, MRN [id number]  PCP:  Adam Agreste, MD  Cardiologist:  Adam Munroe, MD  Electrophysiologist:  None   Referring MD: Adam Agreste, MD   Chief Complaint: Post hospital follow up, recurrent HF symptoms  History of Present Illness:    Adam Reid is a 74 y.o. male with a history of HFpEF with restrictive filling on echo, anemia with GI workup pending, atrial fibrillation/flutter on monitor not on [facility] yet due to need for GI workup, and COVID 19 in December 2020, presents for post hospital follow up.  His wife serves as his interpreter.  Recently hospitalized for acute on chronic diastolic congestive heart failure, diuresed on IV Lasix.  Transition to oral and discharged on 60 mg daily.  His weight was increasing so he increased his dose to 80 mg daily. He was discharged on March 17 and began recording his weights on March 20 at.  He was 132 pounds on March 20, and is 145 pounds today.  He is short of breath with minimal ambulation, JVP is clearly appreciable to the mid one third of the neck while sitting at 90 degrees, with normal respiratory variation.  Laboratory studies were obtained 5 days ago demonstrating a creatinine of 3, and he was instructed to stop his Lasix.  He has become more short of breath with lower extremity swelling over the past several days.  He is not able to function in his day-to-day life.  Has not made colonscopy appt because he does not feel well but we discussed that this is the exact reason to get the GI evaluation.  His wife tells me he has been losing weight, feeling short of breath and weak.  We discussed possible etiologies of anemia and weight loss and need for GI evaluation.  I was concerned about cardiac amyloidosis given restrictive filling pattern on echo.  Multiple myeloma panel obtained and immunofixation did not demonstrate monoclonal protein spike.  I  planned to discuss PYP and further amyloid assessment with the patient at our next visit, however he was hospitalized in the interim.  Past Medical History:  Diagnosis Date  . Chronic kidney disease   . Diabetes mellitus without complication ([place])   . Hyperlipidemia   . Hypertension   . Thyroid disease    was on supplement, taken off by [name]    Past Surgical History:  Procedure Laterality Date  . APPENDECTOMY    . CARDIAC CATHETERIZATION N/A 01/30/2016   Procedure: Left Heart Cath and Coronary Angiography;  Surgeon: [name], MD;  Location: [facility];  Service: Cardiovascular;  Laterality: N/A;    Current Medications: Current Meds  Medication Sig  . albuterol (VENTOLIN HFA) 108 (90 Base) MCG/ACT inhaler Inhale 2 puffs into the lungs every 6 (six) hours as needed for wheezing or shortness of breath.  [name] aspirin EC 81 MG EC tablet Take 1 tablet (81 mg total) by mouth daily.  [name] atorvastatin (LIPITOR) 80 MG tablet TAKE 1 TABLET(80 MG) BY MOUTH DAILY (Patient taking differently: Take 80 mg by mouth daily. )  . blood glucose meter kit and supplies Use up to two times daily as directed  ICD10 E10.9 E11.9  . Blood Glucose Monitoring Suppl (ONETOUCH VERIO) w/Device KIT as directed.   . Cholecalciferol (VITAMIN D-3 PO) Take 1 tablet by mouth daily.   . citalopram (CELEXA) 20 MG tablet TAKE 1 TABLET(20  MG) BY MOUTH DAILY (Patient taking differently: Take 20 mg by mouth daily. TAKE 1 TABLET(20 MG) BY MOUTH DAILY)  . cyanocobalamin 500 MCG tablet Take 500 mcg by mouth daily. Reported on 12/02/2015  . diltiazem (CARDIZEM CD) 180 MG 24 hr capsule Take 1 capsule (180 mg total) by mouth daily.  [name] glipiZIDE (GLUCOTROL XL) 10 MG 24 hr tablet TAKE 1 TABLET(10 MG) BY MOUTH DAILY WITH BREAKFAST (Patient taking differently: Take 12.5 mg by mouth daily with breakfast. )  . GlucoCom Lancets MISC Use for home glucose monitoring  . glucose blood (ONETOUCH VERIO) test strip Test up to 2  times per day.  Uncontrolled diabetes with hyperglycemia and stage 4 CKD.  . metoprolol succinate (TOPROL-XL) 25 MG 24 hr tablet Take 1 tablet (25 mg total) by mouth daily.  . nitroGLYCERIN (NITROSTAT) 0.4 MG SL tablet Place 1 tablet (0.4 mg total) under the tongue every 5 (five) minutes x 3 doses as needed for chest pain.  . pioglitazone (ACTOS) 30 MG tablet TAKE 1 TABLET(30 MG) BY MOUTH DAILY (Patient taking differently: Take 30 mg by mouth daily. )  . polyethylene glycol (MIRALAX / GLYCOLAX) 17 g packet Take 17 g by mouth daily as needed for mild constipation or moderate constipation.   . senna (SENOKOT) 8.6 MG tablet Take 1 tablet by mouth as needed for constipation.   . sitaGLIPtin (JANUVIA) 25 MG tablet Take 1 tablet (25 mg total) by mouth daily.  . tamsulosin (FLOMAX) 0.4 MG CAPS capsule TAKE 2 CAPSULES(0.8 MG) BY MOUTH DAILY (Patient taking differently: Take 0.8 mg by mouth daily. )  . [DISCONTINUED] calcitRIOL (ROCALTROL) 0.25 MCG capsule Take 0.25 mcg by mouth every other day.      Allergies:   Patient has no known allergies.   Social History   Socioeconomic History  . Marital status: Married    Spouse name: Adam Reid  . Number of children: 1  . Years of education: 12th grade  . Highest education level: Not on file  Occupational History  . Occupation: Retired-accountant  Tobacco Use  . Smoking status: Former Smoker    Packs/day: 0.25    Years: 48.00    Pack years: 12.00    Types: Cigarettes    Quit date: 07/16/2019    Years since quitting: 0.2  . Smokeless tobacco: Never Used  . Tobacco comment: 09/07/16 3-4 cigs daily, trying to cut back  Substance and Sexual Activity  . Alcohol use: No    Alcohol/week: 0.0 standard drinks  . Drug use: No  . Sexual activity: Never    Partners: Female  Other Topics Concern  . Not on file  Social History Narrative   Originally from [place]. Came to the [place] in 2009, following their son who came here for school.   Married.   Lives with his  wife.   Their adult son lives in [place], [place], where he is in optometry school.   Education: [employer]   Exercise: No   Social Determinants of Radio broadcast assistant Strain:   . Difficulty of Paying Living Expenses:   Food Insecurity:   . Worried About Charity fundraiser in the Last Year:   . Arboriculturist in the Last Year:   Transportation Needs:   . Film/video editor (Medical):   [name] Lack of Transportation (Non-Medical):   Physical Activity:   . Days of Exercise per Week:   . Minutes of Exercise per Session:   Stress:   .  Feeling of Stress :   Social Connections:   . Frequency of Communication with Friends and Family:   . Frequency of Social Gatherings with Friends and Family:   . Attends Religious Services:   . Active Member of Clubs or Organizations:   . Attends Archivist Meetings:   [name] Marital Status:      Family History: The patient's family history includes Heart disease (age of onset: 52) in his brother; Hyperlipidemia in his mother; Hypertension in his mother and sister; Kidney disease in his father. There is no history of Colon cancer.  ROS:   Please see the history of present illness.    All other systems reviewed and are negative.  EKGs/Labs/Other Studies Reviewed:    The following studies were reviewed today:  EKG:  NSR, ST-T wave abnormality laterally.   Recent Labs: 07/20/2019: ALT 13 09/17/2019: NT-Pro BNP 17,017 09/23/2019: B Natriuretic Peptide 1,723.7 09/24/2019: Magnesium 2.3; TSH 3.691 09/26/2019: Hemoglobin 7.9; Platelets 375 10/03/2019: BUN 73; Creatinine, Ser 3.05; Potassium 4.1; Sodium 135  Recent Lipid Panel    Component Value Date/Time   CHOL 159 07/20/2019 1406   TRIG 127 07/20/2019 1406   HDL 80 07/20/2019 1406   CHOLHDL 2.0 07/20/2019 1406   CHOLHDL 2.1 03/26/2016 1839   VLDL 28 03/26/2016 1839   LDLCALC 57 07/20/2019 1406    Physical Exam:    VS:  BP 124/66 (BP Location: Right Arm, Patient Position: Sitting,  Cuff Size: Normal)   Pulse 86   Temp (!) 97 F (36.1 C)   Ht 5' 6" (1.676 m)   Wt 149 lb (67.6 kg)   BMI 24.05 kg/m     Wt Readings from Last 5 Encounters:  10/08/19 149 lb (67.6 kg)  09/26/19 129 lb 10.1 oz (58.8 kg)  09/17/19 136 lb (61.7 kg)  09/14/19 137 lb (62.1 kg)  09/04/19 136 lb 9.6 oz (62 kg)     Constitutional: No acute distress Eyes: sclera non-icteric, normal conjunctiva and lids ENMT: normal dentition, moist mucous membranes Cardiovascular: regular rhythm, normal rate, no murmurs. S1 and S2 normal. JVP elevated to mid 1/3 of neck at 90 deg.  Respiratory: diminished in bases, crackles at mid lung  GI : normal bowel sounds, soft and nontender. Distended but not tense. MSK: extremities warm, well perfused. 1+ tense edema at shins, 2+ pedal edema  NEURO: grossly nonfocal exam, moves all extremities. PSYCH: alert and oriented x 3, normal mood and affect.   ASSESSMENT:    1. Acute on chronic diastolic congestive heart failure, NYHA class 3 ([place])   2. Essential hypertension   3. Elevated serum creatinine   4. Medication management   5. Paroxysmal atrial fibrillation (HCC)    PLAN:    Acute on chronic diastolic HF -patient is volume overloaded with 13 pounds of weight gain in 9 days, NYHA class III symptoms, and failure of diuresis at home with worsening renal function.  Our initial plan was to send the patient to the emergency department for admission to the hospital.  I was contacted by Dr. Tarri Abernethy as part of the advanced heart failure team that the patient may be a good candidate for home IV Lasix coordinated through the advanced heart failure clinic.  He will be diuresed at home and followed with laboratory studies and seen by advanced heart failure afterward.  This is the optimal plan for Mr. Schriver.  He will require further work-up of his diastolic heart failure including completion of the work-up for  amyloid and probable need for right heart catheterization.  I am  grateful for the input of advanced heart failure services and he can likely have this work-up with their office.  PAF -event monitor still in progress but documents atrial flutter with variable rate.  We will need to start anticoagulation at some point, however this is pending an evaluation with GI for anemia and possible bleeding.  Risks and benefits of waiting on anticoagulation have been discussed over serial visits, and I have encouraged the patient to press forward with his GI evaluation.   Total time of encounter: 45 minutes total time of encounter, including 30 minutes spent in face-to-face patient care on the date of this encounter. This time includes coordination of care and counseling regarding above mentioned problem list. Remainder of non-face-to-face time involved reviewing chart documents/testing relevant to the patient encounter and documentation in the medical record. I have independently reviewed documentation from referring provider.   Cherlynn Kaiser, MD [place]  [facility]    Medication Adjustments/Labs and Tests Ordered: Current medicines are reviewed at length with the patient today.  Concerns regarding medicines are outlined above.  Orders Placed This Encounter  Procedures  . EKG 12-Lead   No orders of the defined types were placed in this encounter.   Patient Instructions  Medication Instructions:  NO CHANGES *If you need a refill on your cardiac medications before your next appointment, please call your pharmacy*   Lab Work: NOT NEEDED    Testing/Procedures:    Follow-Up: At [facility], you and your health needs are our priority.  As part of our continuing mission to provide you with exceptional heart care, we have created designated Provider Care Teams.  These Care Teams include your primary Cardiologist (physician) and Advanced Practice Providers (APPs -  Physician Assistants and Nurse Practitioners) who all work together to provide you  with the care you need, when you need it.  We recommend signing up for the patient portal called "MyChart".  Sign up information is provided on this After Visit Summary.  MyChart is used to connect with patients for Virtual Visits (Telemedicine).  Patients are able to view lab/test results, encounter notes, upcoming appointments, etc.  Non-urgent messages can be sent to your provider as well.   To learn more about what you can do with MyChart, go to [email or web address].    Your next appointment:     [facility]  The format for your next appointment:   In Person  Provider:   Cherlynn Kaiser, MD   Other Instructions PLEASE [place]

## 2019-10-08 NOTE — ED Notes (Signed)
Received call from cardiology that pt is in acute congestive heart failure with a recent weight gain of 13lbs and will need admission by cardiology.  On arrival his wife is translating farsi for him and declines translator as she would like to be the one to translate for him.  Pt has had his lasix decreased due to kidney function per wife and has had 13lbs weight gain in last 9 days.

## 2019-10-08 NOTE — Plan of Care (Addendum)
[facility]  Consult Note  Chief Complaint: [facility]  History of Present Illness: Adam Reid a 74 y.o.malewithhistory of diastolic CHF,recently had 2D echo in February 2021 last month withEF of 55 to 60% with grade 3 diastolic dysfunction,who was diagnosed with COVID-19 infection in December 2020 presented to the hospital from his cardiologists office with progressive LE edema, orthopnea, DOE, and 16 lbs weight gain (dry weight 132lbs) since his discharge from the hospital on 09/26/2019. Initially he was responding well to his PO furosemide; however, had a increase in his creatinine so he was instructed to hold his furosemide. Over the net week he bagan to experience progressive DOE and LE edema. He otherwise has been taking his medications as prescribed and has not run out of any medications. He has not noticed any palpitations or chest pain. He lives with his wife and his wife's father. His wife does the cooking but otherwise he is function in all his ADLs.   On presentation to the ED he is hemodynamically stable. I spoke with the patient's cardiologist Dr. Margaretann Loveless. He is not in extremist but was sent to the ED for admission and IV diuretics. Dr. Margaretann Loveless and I discussed enrolling the patient in the [facility] program and she agrees that he would be a good candidate. I discussed the program with the patient and his wife who have agreed to be enrolled in the program. Care has been coordinated with [facility] and he will be seen in the home tonight for blood work and IV diuretics.   Meds:  No current facility-administered medications on file prior to encounter.   Current Outpatient Medications on File Prior to Encounter  Medication Sig Dispense Refill  . albuterol (VENTOLIN HFA) 108 (90 Base) MCG/ACT inhaler Inhale 2 puffs into the lungs every 6 (six) hours as needed for wheezing or shortness of breath. 18 g 0  . aspirin EC 81 MG EC tablet Take 1 tablet (81 mg total) by  mouth daily. 30 tablet 5  . atorvastatin (LIPITOR) 80 MG tablet TAKE 1 TABLET(80 MG) BY MOUTH DAILY (Patient taking differently: Take 80 mg by mouth daily. ) 90 tablet 1  . blood glucose meter kit and supplies Use up to two times daily as directed  ICD10 E10.9 E11.9 1 each 0  . Blood Glucose Monitoring Suppl (ONETOUCH VERIO) w/Device KIT as directed.   0  . Cholecalciferol (VITAMIN D-3 PO) Take 1 tablet by mouth daily.     . citalopram (CELEXA) 20 MG tablet TAKE 1 TABLET(20 MG) BY MOUTH DAILY (Patient taking differently: Take 20 mg by mouth daily. TAKE 1 TABLET(20 MG) BY MOUTH DAILY) 90 tablet 1  . cyanocobalamin 500 MCG tablet Take 500 mcg by mouth daily. Reported on 12/02/2015    . diltiazem (CARDIZEM CD) 180 MG 24 hr capsule Take 1 capsule (180 mg total) by mouth daily. 30 capsule 2  . glipiZIDE (GLUCOTROL XL) 10 MG 24 hr tablet TAKE 1 TABLET(10 MG) BY MOUTH DAILY WITH BREAKFAST (Patient taking differently: Take 12.5 mg by mouth daily with breakfast. ) 90 tablet 1  . GlucoCom Lancets MISC Use for home glucose monitoring 100 each 3  . glucose blood (ONETOUCH VERIO) test strip Test up to 2 times per day.  Uncontrolled diabetes with hyperglycemia and stage 4 CKD. 100 each 3  . metoprolol succinate (TOPROL-XL) 25 MG 24 hr tablet Take 1 tablet (25 mg total) by mouth daily. 90 tablet 1  . nitroGLYCERIN (NITROSTAT) 0.4 MG  SL tablet Place 1 tablet (0.4 mg total) under the tongue every 5 (five) minutes x 3 doses as needed for chest pain. 25 tablet 2  . pioglitazone (ACTOS) 30 MG tablet TAKE 1 TABLET(30 MG) BY MOUTH DAILY (Patient taking differently: Take 30 mg by mouth daily. ) 90 tablet 1  . polyethylene glycol (MIRALAX / GLYCOLAX) 17 g packet Take 17 g by mouth daily as needed for mild constipation or moderate constipation.     . senna (SENOKOT) 8.6 MG tablet Take 1 tablet by mouth as needed for constipation.     . sitaGLIPtin (JANUVIA) 25 MG tablet Take 1 tablet (25 mg total) by mouth daily. 30 tablet 2   . tamsulosin (FLOMAX) 0.4 MG CAPS capsule TAKE 2 CAPSULES(0.8 MG) BY MOUTH DAILY (Patient taking differently: Take 0.8 mg by mouth daily. ) 180 capsule 1   Physical Exam: Blood pressure 125/69, pulse 72, temperature 97.7 F (36.5 C), resp. rate 18, SpO2 94 %.  General: Well nourished male in no acute distress HENT: Normocephalic, atraumatic, moist mucus membranes Pulm: Good air movement with no wheezing or crackles  CV: RRR, no murmurs, no rubs, +JVD  Abdomen: Active bowel sounds, soft, non-distended, no tenderness to palpation  Extremities: Pulses palpable in all extremities, 2+ BLE edema  Skin: Warm and dry  Neuro: Alert and oriented x 3  Clinical Screening: (ALL ANSWERS MUST BE NO)  Based on current presentation is the patient likely to require: advanced diagnostics, advanced imaging (CT, MRI, nuclear stress testing), cardiac catheterization, EGD/colonoscopy, or lab monitoring not amendable to home monitoring (troponin, >q12 hour labs): NO.  Based on current presentation is the patient is likely to require: mechanical ventilation (invasive and noninvasive, history of intubation) and/or vasoactive medications: NO.  Based on current presentation is the patient likely to require a surgical or IR procedure including but not limited to intraabdominal abscess drainage, percutaneous nephrostomy tube placement, thoracentesis for parapneumonic effusion, surgical wound debridement: NO.  Based on current presentation is the patient is likely to require: daily specialty consultation, blood transfusions, respiratory isolation/airborne precautions, active adjustments of opiates or IV pain medications: NO.  Does the patient have barriers that would make it unsafe to provide care in the home including but not limited to severe AMS, active substance use disorder, history of or high risk of noncompliance with primary treatment plan: NO.  Has the patient ever been intubated or do they have a new  tracheostomy: NO.  Does the patient have an unstable arrhythmia: NO.  Is hemodialysis likely to be required (i.e. already on HD or newly anuric/sever ATN): NO.  Is there risk for inability to obtain IV access: NO.]  Social Screening: (ALL QUESTIONS MUST BE YES) Does the patient have a home recovery environment? YES.  Is the patient's home recovery environment in an eligible geography [facility])? YES.  Does the patient's home have water, electricity, bathroom, heat/ac, refrigerator? YES.  Does the patient feel safe at home? YES.  Are family/caregivers willing to participate, as needed, while the patient participates [facility].  Is there a person in the home (patient or other) willing/able to take vital signs and answer phone calls? YES.  Is the patient willing to put pets in a secure area while [facility] and affiliated staff are in the home? YES.  Is patient willing/able to participate in the [facility] (this includes [facility] affiliated staff entering the home, and associated services)? YES.  Is the  patient/patient's HCP willing/able to sign consent? YES.  Assessment / Plan:  Patient with known HFpEF presenting with signs and symptoms of acute volume overload. He responded well to IV furosemide 40 mg BID during his last hospitalization. He will be admitted to the [facility] program and seen by [facility] this evening for a BMP and IV diuretics. I have reached out to the advanced HF clinic to schedule an appointment for further evaluation of the patients HFpEF.   Based on the HPI and information obtained the patient is a candidate for the [facility]. Consent has been signed and the patient has been provided with a copy.   [facility] has been notified and will present to the patient's house this evening.   Home health / DME needs: None identified   Medication needs: None identified   Other  needs: None identified   Patient's contact information:  Phone: [phone] Address: [address] [place] [address]  Please do not hesitate to call with questions/concerns.   Ina Homes, MD 10/08/2019, 2:00 PM  Cell [phone]

## 2019-10-08 NOTE — ED Notes (Signed)
Pt was d/c to follow up with DM tomorrow.  Pt family is aware of plan.  Pt helped to bathroom to void.

## 2019-10-08 NOTE — ED Notes (Signed)
Cardiology at triage to eval pt

## 2019-10-08 NOTE — Patient Instructions (Signed)
Medication Instructions:  NO CHANGES *If you need a refill on your cardiac medications before your next appointment, please call your pharmacy*   Lab Work: NOT NEEDED    Testing/Procedures:    Follow-Up: At [facility], you and your health needs are our priority.  As part of our continuing mission to provide you with exceptional heart care, we have created designated Provider Care Teams.  These Care Teams include your primary Cardiologist (physician) and Advanced Practice Providers (APPs -  Physician Assistants and Nurse Practitioners) who all work together to provide you with the care you need, when you need it.  We recommend signing up for the patient portal called "MyChart".  Sign up information is provided on this After Visit Summary.  MyChart is used to connect with patients for Virtual Visits (Telemedicine).  Patients are able to view lab/test results, encounter notes, upcoming appointments, etc.  Non-urgent messages can be sent to your provider as well.   To learn more about what you can do with MyChart, go to [email or web address].    Your next appointment:     [facility]  The format for your next appointment:   In Person  Provider:   Cherlynn Kaiser, MD   Other Instructions PLEASE [place]

## 2019-10-08 NOTE — Discharge Instructions (Addendum)
Thank you for enrolling in the [facility] will meet you at your home tonight to get lab work and give you lasix. They will be out 1-2 times per day until you get to feeling better.   You are scheduled to see the Advanced HF clinic on April 5th at 11:20AM. Please arrive 30 minutes prior.

## 2019-10-08 NOTE — ED Triage Notes (Signed)
Pt and family reports pt having sob with mild exertion. Has been seen for same in past and takes cardizem and lasix. They have stopped lasix recently due to increased renal function and now has increase in sob. No resp distress is noted.

## 2019-10-09 ENCOUNTER — Encounter (INDEPENDENT_AMBULATORY_CARE_PROVIDER_SITE_OTHER): Payer: Self-pay | Admitting: Otolaryngology

## 2019-10-09 ENCOUNTER — Ambulatory Visit (INDEPENDENT_AMBULATORY_CARE_PROVIDER_SITE_OTHER): Payer: Medicare Other | Admitting: Otolaryngology

## 2019-10-09 VITALS — Temp 97.9°F

## 2019-10-09 DIAGNOSIS — D490 Neoplasm of unspecified behavior of digestive system: Secondary | ICD-10-CM

## 2019-10-09 DIAGNOSIS — I2510 Atherosclerotic heart disease of native coronary artery without angina pectoris: Secondary | ICD-10-CM

## 2019-10-09 NOTE — Progress Notes (Signed)
HPI: Adam Reid is a 74 y.o. male who presents is referred by Dr. [name] for evaluation of right parotid mass.  On discussion with his wife they have noticed it over the past 4 5 months.  Patient apparently had an MRI scan performed 3 years ago that noticed to right parotid masses at that time.  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 Alveda Reasons.  Patient does not understand English well and I conversed through his wife in the office today. 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  This apparently was noticed on previous MRI scan 3 years ago.  Past Medical History:  Diagnosis Date  . Chronic kidney disease   . Diabetes mellitus without complication ([place])   . Hyperlipidemia   . Hypertension   . Thyroid disease    was on supplement, taken off by [name]   Past Surgical History:  Procedure Laterality Date  . APPENDECTOMY    . CARDIAC CATHETERIZATION N/A 01/30/2016   Procedure: Left Heart Cath and Coronary Angiography;  Surgeon: [name], MD;  Location: [facility];  Service: Cardiovascular;  Laterality: N/A;   Social History   Socioeconomic History  . Marital status: Married    Spouse name: [name]  . Number of children: 1  . Years of education: 12th grade  . Highest education level: Not on file  Occupational History  . Occupation: Retired-accountant  Tobacco Use  . Smoking status: Former Smoker    Packs/day: 0.25    Years: 48.00    Pack years: 12.00    Types: Cigarettes    Quit date: 07/16/2019    Years since quitting: 0.2  . Smokeless tobacco: Never Used  . Tobacco comment: 09/07/16 3-4 cigs daily, trying to cut back  Substance and Sexual Activity  . Alcohol use: No    Alcohol/week: 0.0 standard drinks  . Drug use: No  . Sexual activity:  Never    Partners: Female  Other Topics Concern  . Not on file  Social History Narrative   Originally from [place]. Came to the [place] in 2009, following their son who came here for school.   Married.   Lives with his wife.   Their adult son lives in [place], [place], where he is in optometry school.   Education: [employer]   Exercise: No   Social Determinants of Radio broadcast assistant Strain:   . Difficulty of Paying Living Expenses:   Food Insecurity:   . Worried About Charity fundraiser in the Last Year:   . Arboriculturist in the Last Year:   Transportation Needs:   . Film/video editor (Medical):   [name] Lack of Transportation (Non-Medical):   Physical Activity:   . Days of Exercise per Week:   . Minutes of Exercise per Session:   Stress:   . Feeling of Stress :   Social Connections:   . Frequency of Communication with Friends and Family:   . Frequency of Social Gatherings with Friends and Family:   . Attends Religious Services:   . Active Member of Clubs or Organizations:   . Attends Archivist Meetings:   [name] Marital Status:    Family History  Problem Relation Age of Onset  . Hypertension Mother   . Hyperlipidemia Mother   .  Hypertension Sister   . Kidney disease Father   . Heart disease Brother 50       open heart surgery  . Colon cancer Neg Hx    No Known Allergies Prior to Admission medications   Medication Sig Start Date End Date Taking? Authorizing Provider  albuterol (VENTOLIN HFA) 108 (90 Base) MCG/ACT inhaler Inhale 2 puffs into the lungs every 6 (six) hours as needed for wheezing or shortness of breath. 07/02/19  Yes Maximiano Coss, NP  aspirin EC 81 MG EC tablet Take 1 tablet (81 mg total) by mouth daily. 12/03/15  Yes Mayo, Pete Pelt, MD  atorvastatin (LIPITOR) 80 MG tablet TAKE 1 TABLET(80 MG) BY MOUTH DAILY Patient taking differently: Take 80 mg by mouth daily.  07/20/19  Yes [name], MD  blood glucose meter kit and supplies Use up  to two times daily as directed  ICD10 E10.9 E11.9 02/20/19  Yes [name], MD  Blood Glucose Monitoring Suppl (ONETOUCH VERIO) w/Device KIT as directed.  09/03/15  Yes [provider]  Cholecalciferol (VITAMIN D-3 PO) Take 1 tablet by mouth daily.    Yes [provider]  citalopram (CELEXA) 20 MG tablet TAKE 1 TABLET(20 MG) BY MOUTH DAILY Patient taking differently: Take 20 mg by mouth daily. TAKE 1 TABLET(20 MG) BY MOUTH DAILY 07/20/19  Yes [name], MD  cyanocobalamin 500 MCG tablet Take 500 mcg by mouth daily. Reported on 12/02/2015   Yes [provider]  diltiazem (CARDIZEM CD) 180 MG 24 hr capsule Take 1 capsule (180 mg total) by mouth daily. 09/27/19  Yes Pokhrel, Laxman, MD  glipiZIDE (GLUCOTROL XL) 10 MG 24 hr tablet TAKE 1 TABLET(10 MG) BY MOUTH DAILY WITH BREAKFAST Patient taking differently: Take 12.5 mg by mouth daily with breakfast.  07/30/19  Yes [name], MD  GlucoCom Lancets MISC Use for home glucose monitoring 09/02/15  Yes Jeffery, Chelle, PA  glucose blood (ONETOUCH VERIO) test strip Test up to 2 times per day.  Uncontrolled diabetes with hyperglycemia and stage 4 CKD. 04/13/19  Yes [name], MD  metoprolol succinate (TOPROL-XL) 25 MG 24 hr tablet Take 1 tablet (25 mg total) by mouth daily. 08/10/19  Yes [name], MD  nitroGLYCERIN (NITROSTAT) 0.4 MG SL tablet Place 1 tablet (0.4 mg total) under the tongue every 5 (five) minutes x 3 doses as needed for chest pain. 01/31/16  Yes [name] E, NP  pioglitazone (ACTOS) 30 MG tablet TAKE 1 TABLET(30 MG) BY MOUTH DAILY Patient taking differently: Take 30 mg by mouth daily.  07/20/19  Yes [name], MD  polyethylene glycol (MIRALAX / GLYCOLAX) 17 g packet Take 17 g by mouth daily as needed for mild constipation or moderate constipation.    Yes [provider]  senna (SENOKOT) 8.6 MG tablet Take 1 tablet by mouth as needed for constipation.    Yes [provider]  sitaGLIPtin (JANUVIA) 25 MG tablet Take 1 tablet (25 mg total) by mouth daily. 07/09/19  Yes [name], MD  tamsulosin (FLOMAX) 0.4 MG CAPS capsule TAKE 2 CAPSULES(0.8 MG) BY MOUTH DAILY Patient taking differently: Take 0.8 mg by mouth daily.  07/20/19  Yes [name], MD     Positive ROS: Otherwise negative  All other systems have been reviewed and were otherwise negative with the exception of those mentioned in the HPI and as above.  Physical Exam: Constitutional: Alert, well-appearing, no acute distress Ears: External  ears without lesions or tenderness. Ear canals are clear bilaterally with intact, clear TMs.  Nasal: External nose without lesions. Clear nasal passages Oral: Lips and gums without lesions. Tongue and palate mucosa without lesions. Posterior oropharynx clear.  Clear drainage from the parotid ducts bilaterally. Neck: No palpable adenopathy or masses.  Patient has a easily palpable approximate 2-1/2 cm mass just behind the angle of the jaw on the right side just below the earlobe.  This is nonpainful to palpation.  He has normal facial nerve function. Respiratory: Breathing comfortably  Skin: No facial/neck lesions or rash noted.  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  It has no clinical evidence of malignancy.  Discussed with him concerning options of removal of this which will require surgery and general anesthesia versus needle biopsy of the mass if he elects to observe. If he decides to have this removed he will have to be cleared with his cardiologist concerning risk of general anesthesia. I reviewed with the patient as well as his wife concerning the parotid neoplasm as well as proximity to the facial nerve if surgery is performed. They will call us back as to  whether to schedule surgery versus obtaining fine-needle aspirate.   Radene Journey, MD   CC:

## 2019-10-10 ENCOUNTER — Other Ambulatory Visit: Payer: Self-pay | Admitting: Family Medicine

## 2019-10-10 DIAGNOSIS — E1122 Type 2 diabetes mellitus with diabetic chronic kidney disease: Secondary | ICD-10-CM

## 2019-10-15 ENCOUNTER — Other Ambulatory Visit: Payer: Self-pay

## 2019-10-15 ENCOUNTER — Ambulatory Visit (HOSPITAL_COMMUNITY)
Admit: 2019-10-15 | Discharge: 2019-10-15 | Disposition: A | Discharge status: Home / Self Care | Payer: Medicare Other | Source: Ambulatory Visit | Attending: Internal Medicine | Admitting: Internal Medicine

## 2019-10-15 VITALS — BP 120/58 | HR 88 | Wt 138.2 lb

## 2019-10-15 DIAGNOSIS — I480 Paroxysmal atrial fibrillation: Secondary | ICD-10-CM | POA: Diagnosis not present

## 2019-10-15 DIAGNOSIS — E1122 Type 2 diabetes mellitus with diabetic chronic kidney disease: Secondary | ICD-10-CM | POA: Insufficient documentation

## 2019-10-15 DIAGNOSIS — Z7984 Long term (current) use of oral hypoglycemic drugs: Secondary | ICD-10-CM | POA: Diagnosis not present

## 2019-10-15 DIAGNOSIS — Z79899 Other long term (current) drug therapy: Secondary | ICD-10-CM | POA: Insufficient documentation

## 2019-10-15 DIAGNOSIS — Z841 Family history of disorders of kidney and ureter: Secondary | ICD-10-CM | POA: Insufficient documentation

## 2019-10-15 DIAGNOSIS — Z8349 Family history of other endocrine, nutritional and metabolic diseases: Secondary | ICD-10-CM | POA: Diagnosis not present

## 2019-10-15 DIAGNOSIS — D509 Iron deficiency anemia, unspecified: Secondary | ICD-10-CM | POA: Insufficient documentation

## 2019-10-15 DIAGNOSIS — I130 Hypertensive heart and chronic kidney disease with heart failure and stage 1 through stage 4 chronic kidney disease, or unspecified chronic kidney disease: Secondary | ICD-10-CM | POA: Diagnosis not present

## 2019-10-15 DIAGNOSIS — I10 Essential (primary) hypertension: Secondary | ICD-10-CM

## 2019-10-15 DIAGNOSIS — I5032 Chronic diastolic (congestive) heart failure: Secondary | ICD-10-CM | POA: Diagnosis present

## 2019-10-15 DIAGNOSIS — Z87891 Personal history of nicotine dependence: Secondary | ICD-10-CM | POA: Diagnosis not present

## 2019-10-15 DIAGNOSIS — E079 Disorder of thyroid, unspecified: Secondary | ICD-10-CM | POA: Insufficient documentation

## 2019-10-15 DIAGNOSIS — E785 Hyperlipidemia, unspecified: Secondary | ICD-10-CM | POA: Diagnosis not present

## 2019-10-15 DIAGNOSIS — Z7982 Long term (current) use of aspirin: Secondary | ICD-10-CM | POA: Insufficient documentation

## 2019-10-15 DIAGNOSIS — Z8249 Family history of ischemic heart disease and other diseases of the circulatory system: Secondary | ICD-10-CM | POA: Insufficient documentation

## 2019-10-15 DIAGNOSIS — I2510 Atherosclerotic heart disease of native coronary artery without angina pectoris: Secondary | ICD-10-CM | POA: Insufficient documentation

## 2019-10-15 DIAGNOSIS — N184 Chronic kidney disease, stage 4 (severe): Secondary | ICD-10-CM

## 2019-10-15 DIAGNOSIS — I5033 Acute on chronic diastolic (congestive) heart failure: Secondary | ICD-10-CM | POA: Insufficient documentation

## 2019-10-15 LAB — BRAIN NATRIURETIC PEPTIDE: B Natriuretic Peptide: 841.5 pg/mL — ABNORMAL HIGH (ref 0.0–100.0)

## 2019-10-15 MED ORDER — TORSEMIDE 20 MG PO TABS: ORAL_TABLET | ORAL | 3 refills | Status: DC

## 2019-10-15 NOTE — Progress Notes (Signed)
ADVANCED HF CLINIC CONSULT NOTE  Referring Physician: Primary Care: Merri Ray MD [facility]) Primary Cardiologist: Margaretann Loveless  HPI:  Adam Reid is a 74 y.o. male from [place] with a h/o a tobacco abuse (quit 2021), HTN, DM2, CKD 4 (Cr ~3.3), diastolic HF, PAF and iron-deficiency anemia referred by Dr. 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   Heart cath in 7/17 for CP LAD 20%, D2 50%, OM 20%  Had COVID in 2019. Not too many symptoms except for fatigue.   Hospitalized 3/54-56/25 for diastolic HF. Diuresed and weight down to 129 pounds. Sent home on lasix 60 daily Echo 55-60% RV ok. Restrictive filling pattern  His weight was increasing so he increased his dose to 80 mg daily. He was discharged on March 17 and began recording his weights on March 20 at.  He was 132 pounds on March 20, and was 145 pounds when he saw Dr. Alveda Reasons on 3/29. Was enrolled in HF at home. Was then switched to torsemide 20 bid. Weighs every day. Weight has been coming down since starting torsemide last week. Now 135.    Dr, Alveda Reasons was concerned about cardiac amyloidosis given restrictive filling pattern on echo.  Multiple myeloma panel obtained and immunofixation did not demonstrate monoclonal protein spike. PYP was considered but not ordered yet.   Here with his wife who serves as a Optometrist. He is feeling better but still SOB with mild activity. No further orthopnea or PND. BP 130s. No dizziness.   Undergoing GI w/u for anemia but awaiting Cardiology clearance     Review of Systems: [y] = yes, '[ ]'$  = no   General: Weight gain '[ ]'$ ; Weight loss [ y]; Anorexia y'[ ]'$ ; Fatigue [ y]; Fever '[ ]'$ ; Chills '[ ]'$ ; Weakness '[ ]'$   Cardiac: Chest pain/pressure '[ ]'$ ; Resting SOB '[ ]'$ ; Exertional SOB [ y]; Orthopnea '[ ]'$ ; Pedal Edema [ y]; Palpitations '[ ]'$ ; Syncope '[ ]'$ ; Presyncope '[ ]'$ ; Paroxysmal nocturnal dyspnea'[ ]'$   Pulmonary: Cough '[ ]'$ ; Wheezing'[ ]'$ ; Hemoptysis'[ ]'$ ; Sputum '[ ]'$ ; Snoring '[ ]'$   GI: Vomiting[  ]; Dysphagia'[ ]'$ ; Melena'[ ]'$ ; Hematochezia '[ ]'$ ; Heartburn'[ ]'$ ; Abdominal pain '[ ]'$ ; Constipation '[ ]'$ ; Diarrhea '[ ]'$ ; BRBPR '[ ]'$   GU: Hematuria'[ ]'$ ; Dysuria '[ ]'$ ; Nocturia'[ ]'$   Vascular: Pain in legs with walking '[ ]'$ ; Pain in feet with lying flat '[ ]'$ ; Non-healing sores '[ ]'$ ; Stroke '[ ]'$ ; TIA '[ ]'$ ; Slurred speech '[ ]'$ ;  Neuro: Headaches'[ ]'$ ; Vertigo'[ ]'$ ; Seizures'[ ]'$ ; Paresthesias'[ ]'$ ;Blurred vision '[ ]'$ ; Diplopia '[ ]'$ ; Vision changes '[ ]'$   Ortho/Skin: Arthritis [ y]; Joint pain [ y]; Muscle pain '[ ]'$ ; Joint swelling '[ ]'$ ; Back Pain '[ ]'$ ; Rash '[ ]'$   Psych: Depression'[ ]'$ ; Anxiety'[ ]'$   Heme: Bleeding problems '[ ]'$ ; Clotting disorders '[ ]'$ ; Anemia Blue.Reese ]  Endocrine: Diabetes Blue.Reese ]; Thyroid dysfunction'[ ]'$    Past Medical History:  Diagnosis Date  . Chronic kidney disease   . Diabetes mellitus without complication ([place])   . Hyperlipidemia   . Hypertension   . Thyroid disease    was on supplement, taken off by [name]    Current Outpatient Medications  Medication Sig Dispense Refill  . albuterol (VENTOLIN HFA) 108 (90 Base) MCG/ACT inhaler Inhale 2 puffs into the lungs every 6 (six) hours as needed for wheezing or shortness of breath. 18 g 0  . aspirin EC 81 MG EC tablet Take 1 tablet (81 mg total) by mouth daily. [place]  tablet 5  . atorvastatin (LIPITOR) 80 MG tablet TAKE 1 TABLET(80 MG) BY MOUTH DAILY 90 tablet 1  . blood glucose meter kit and supplies Use up to two times daily as directed  ICD10 E10.9 E11.9 1 each 0  . Blood Glucose Monitoring Suppl (ONETOUCH VERIO) w/Device KIT as directed.   0  . citalopram (CELEXA) 20 MG tablet TAKE 1 TABLET(20 MG) BY MOUTH DAILY 90 tablet 1  . cyanocobalamin 500 MCG tablet Take 500 mcg by mouth daily. Reported on 12/02/2015    . diltiazem (CARDIZEM CD) 180 MG 24 hr capsule Take 1 capsule (180 mg total) by mouth daily. 30 capsule 2  . glipiZIDE (GLUCOTROL XL) 10 MG 24 hr tablet TAKE 1 TABLET(10 MG) BY MOUTH DAILY WITH BREAKFAST (Patient taking differently: Take 12.5 mg by mouth daily with  breakfast. ) 90 tablet 1  . GlucoCom Lancets MISC Use for home glucose monitoring 100 each 3  . glucose blood (ONETOUCH VERIO) test strip Test up to 2 times per day.  Uncontrolled diabetes with hyperglycemia and stage 4 CKD. 100 each 3  . JANUVIA 25 MG tablet TAKE 1 TABLET(25 MG) BY MOUTH DAILY 90 tablet 0  . metoprolol succinate (TOPROL-XL) 25 MG 24 hr tablet Take 1 tablet (25 mg total) by mouth daily. 90 tablet 1  . nitroGLYCERIN (NITROSTAT) 0.4 MG SL tablet Place 1 tablet (0.4 mg total) under the tongue every 5 (five) minutes x 3 doses as needed for chest pain. 25 tablet 2  . Omega 3-6-9 Fatty Acids (OMEGA 3-6-9 COMPLEX PO) Take 1 tablet by mouth daily.    . pioglitazone (ACTOS) 30 MG tablet TAKE 1 TABLET(30 MG) BY MOUTH DAILY 90 tablet 1  . polyethylene glycol (MIRALAX / GLYCOLAX) 17 g packet Take 17 g by mouth daily as needed for mild constipation or moderate constipation.     . senna (SENOKOT) 8.6 MG tablet Take 1 tablet by mouth as needed for constipation.     . tamsulosin (FLOMAX) 0.4 MG CAPS capsule TAKE 2 CAPSULES(0.8 MG) BY MOUTH DAILY 180 capsule 1  . torsemide (DEMADEX) 20 MG tablet Take 20 mg by mouth 2 (two) times daily.     No current facility-administered medications for this encounter.    No Known Allergies    Social History   Socioeconomic History  . Marital status: Married    Spouse name: [name]  . Number of children: 1  . Years of education: 12th grade  . Highest education level: Not on file  Occupational History  . Occupation: Retired-accountant  Tobacco Use  . Smoking status: Former Smoker    Packs/day: 0.25    Years: 48.00    Pack years: 12.00    Types: Cigarettes    Quit date: 07/16/2019    Years since quitting: 0.2  . Smokeless tobacco: Never Used  . Tobacco comment: 09/07/16 3-4 cigs daily, trying to cut back  Substance and Sexual Activity  . Alcohol use: No    Alcohol/week: 0.0 standard drinks  . Drug use: No  . Sexual activity: Never    Partners:  Female  Other Topics Concern  . Not on file  Social History Narrative   Originally from [place]. Came to the [place] in 2009, following their son who came here for school.   Married.   Lives with his wife.   Their adult son lives in [place], [place], where he is in optometry school.   Education: [employer]   Exercise: No   Social  Determinants of Health   Financial Resource Strain:   . Difficulty of Paying Living Expenses:   Food Insecurity:   . Worried About Charity fundraiser in the Last Year:   . Arboriculturist in the Last Year:   Transportation Needs:   . Film/video editor (Medical):   [name] Lack of Transportation (Non-Medical):   Physical Activity:   . Days of Exercise per Week:   . Minutes of Exercise per Session:   Stress:   . Feeling of Stress :   Social Connections:   . Frequency of Communication with Friends and Family:   . Frequency of Social Gatherings with Friends and Family:   . Attends Religious Services:   . Active Member of Clubs or Organizations:   . Attends Archivist Meetings:   [name] Marital Status:   Intimate Partner Violence:   . Fear of Current or Ex-Partner:   . Emotionally Abused:   [name] Physically Abused:   . Sexually Abused:       Family History  Problem Relation Age of Onset  . Hypertension Mother   . Hyperlipidemia Mother   . Hypertension Sister   . Kidney disease Father   . Heart disease Brother 74       open heart surgery  . Colon cancer Neg Hx     Vitals:   10/15/19 1153  BP: (!) 120/58  Pulse: 88  SpO2: 93%  Weight: 62.7 kg (138 lb 4 oz)    PHYSICAL EXAM: General:  Elderly thin male. No respiratory difficulty HEENT: normal Neck: supple. JVP 9-10 Carotids 2+ bilat; no bruits. No lymphadenopathy or thryomegaly appreciated. Cor: PMI nondisplaced. Regular rate & rhythm. +s4 Lungs: clear Abdomen: soft, nontender, + distended. No hepatosplenomegaly. No bruits or masses. Good bowel sounds. Extremities: no cyanosis, clubbing,  rash, 3+ edema Neuro: alert & oriented x 3, cranial nerves grossly intact. moves all 4 extremities w/o difficulty. Affect pleasant.  ECG: 09/24/19 NSR with marked LVH and PACs. Personally reviewed    ASSESSMENT & PLAN:  1. Acute on chronic diastolic HF - Echo 1/24: EF 55-60% with restrictive filling pattern - based on history, ECG, echo findings (normal RV) and concomitant renal failure, I suspect this is due to longstanding HTN +/- DM2 changes and not an infiltrative process like amyloid - remains NYHA III with significant volume overload c/b CKD 4 - Place compression hose - Increase torsemide 40/20 - watch renal function closely BMET today and 1 week - PYP for completeness sake.  - Ideally would love to add SGLT2i but with GFR ~25 will need to d/w Renal  - will see back in 2 weeks to f/u  2. HTN - BP now well controlled  3. CKD 4  - due to HTN and DM2 - Followed by Dr. Elmarie Shiley   4. DM2 - per PCP - consider SGLT2i as above if GFR permits  5. PAF - in NSR today - will need AC once GI w/u complete  6. IDA - ok for GI to scope from our perspective.  - Volume status should improve over next week or two so can proceed in near future  7. Non-obstructive CAD - continue statin   Glori Bickers, MD  1:04 PM

## 2019-10-15 NOTE — Patient Instructions (Signed)
Increase Torsemide to 40 mg (2 tabs) in AM and 20 mg (1 tab) in PM  Labs done today, your results will be available in MyChart, we will contact you for abnormal readings.  Labs needed in 1 week (bmet/bnp) if remote health can do this please call us to schedule a lab appointment  Please wear your compression hose daily, place them on as soon as you get up in the morning and remove before you go to bed at night.  You have been ordered a PYP Scan.  This is done in the Radiology Department of [facility].  When you come for this test please plan to be there 2-3 hours. Our scheduler will call you to schedule this  Your physician recommends that you schedule a follow-up appointment in: 2-3 weeks  If you have any questions or concerns before your next appointment please send [place] a message through [place] or call our office at [phone].  At the [facility], you and your health needs are our priority. As part of our continuing mission to provide you with exceptional heart care, we have created designated Provider Care Teams. These Care Teams include your primary Cardiologist (physician) and Advanced Practice Providers (APPs- Physician Assistants and Nurse Practitioners) who all work together to provide you with the care you need, when you need it.   You may see any of the following providers on your designated Care Team at your next follow up: [name] Dr Glori Bickers . Dr Loralie Champagne . Darrick Grinder, NP . Lyda Jester, PA . Audry Riles, PharmD   Please be sure to bring in all your medications bottles to every appointment.

## 2019-10-17 ENCOUNTER — Telehealth (HOSPITAL_COMMUNITY): Payer: Self-pay | Admitting: Vascular Surgery

## 2019-10-17 NOTE — Telephone Encounter (Signed)
LEFT PT VM, giving appt date and time for PYP scan, scheduled 10/23/19 @ 12 noon, left instruction to arrive @ 11:30 @ [facility] radiology , asked pt to call back to confirm appt

## 2019-10-23 ENCOUNTER — Other Ambulatory Visit: Payer: Self-pay

## 2019-10-23 ENCOUNTER — Encounter (HOSPITAL_COMMUNITY)
Admission: RE | Admit: 2019-10-23 | Discharge: 2019-10-23 | Disposition: A | Discharge status: Home / Self Care | Payer: Medicare Other | Source: Ambulatory Visit | Attending: Internal Medicine | Admitting: Internal Medicine

## 2019-10-23 DIAGNOSIS — I5032 Chronic diastolic (congestive) heart failure: Secondary | ICD-10-CM

## 2019-10-23 MED ORDER — TECHNETIUM TC 99M PYROPHOSPHATE
21.5000 | Freq: Once | INTRAVENOUS | Status: AC | PRN
  Administered 2019-10-23: 21.5 via INTRAVENOUS
  Filled 2019-10-23: qty 22

## 2019-11-02 ENCOUNTER — Other Ambulatory Visit (HOSPITAL_COMMUNITY): Payer: Self-pay | Admitting: Internal Medicine

## 2019-11-05 ENCOUNTER — Ambulatory Visit (HOSPITAL_COMMUNITY)
Admission: RE | Admit: 2019-11-05 | Discharge: 2019-11-05 | Disposition: A | Discharge status: Home / Self Care | Payer: Medicare Other | Source: Ambulatory Visit | Attending: Cardiology | Admitting: Cardiology

## 2019-11-05 ENCOUNTER — Other Ambulatory Visit: Payer: Self-pay

## 2019-11-05 ENCOUNTER — Encounter (HOSPITAL_COMMUNITY): Payer: Self-pay

## 2019-11-05 ENCOUNTER — Ambulatory Visit (HOSPITAL_COMMUNITY)
Admission: RE | Admit: 2019-11-05 | Discharge: 2019-11-05 | Disposition: A | Discharge status: Home / Self Care | Payer: Medicare Other | Source: Ambulatory Visit | Attending: Adult Health | Admitting: Adult Health

## 2019-11-05 VITALS — BP 98/70 | HR 142 | Wt 133.6 lb

## 2019-11-05 DIAGNOSIS — I5032 Chronic diastolic (congestive) heart failure: Secondary | ICD-10-CM

## 2019-11-05 DIAGNOSIS — Z79899 Other long term (current) drug therapy: Secondary | ICD-10-CM | POA: Insufficient documentation

## 2019-11-05 DIAGNOSIS — Z87891 Personal history of nicotine dependence: Secondary | ICD-10-CM | POA: Insufficient documentation

## 2019-11-05 DIAGNOSIS — E785 Hyperlipidemia, unspecified: Secondary | ICD-10-CM | POA: Diagnosis not present

## 2019-11-05 DIAGNOSIS — Z7982 Long term (current) use of aspirin: Secondary | ICD-10-CM | POA: Diagnosis not present

## 2019-11-05 DIAGNOSIS — N184 Chronic kidney disease, stage 4 (severe): Secondary | ICD-10-CM | POA: Diagnosis not present

## 2019-11-05 DIAGNOSIS — I484 Atypical atrial flutter: Secondary | ICD-10-CM | POA: Diagnosis not present

## 2019-11-05 DIAGNOSIS — Z8349 Family history of other endocrine, nutritional and metabolic diseases: Secondary | ICD-10-CM | POA: Diagnosis not present

## 2019-11-05 DIAGNOSIS — I4892 Unspecified atrial flutter: Secondary | ICD-10-CM

## 2019-11-05 DIAGNOSIS — I5033 Acute on chronic diastolic (congestive) heart failure: Secondary | ICD-10-CM | POA: Insufficient documentation

## 2019-11-05 DIAGNOSIS — E079 Disorder of thyroid, unspecified: Secondary | ICD-10-CM | POA: Diagnosis not present

## 2019-11-05 DIAGNOSIS — I2510 Atherosclerotic heart disease of native coronary artery without angina pectoris: Secondary | ICD-10-CM | POA: Diagnosis not present

## 2019-11-05 DIAGNOSIS — Z7984 Long term (current) use of oral hypoglycemic drugs: Secondary | ICD-10-CM | POA: Diagnosis not present

## 2019-11-05 DIAGNOSIS — I130 Hypertensive heart and chronic kidney disease with heart failure and stage 1 through stage 4 chronic kidney disease, or unspecified chronic kidney disease: Secondary | ICD-10-CM | POA: Diagnosis not present

## 2019-11-05 DIAGNOSIS — E1122 Type 2 diabetes mellitus with diabetic chronic kidney disease: Secondary | ICD-10-CM | POA: Insufficient documentation

## 2019-11-05 DIAGNOSIS — Z8249 Family history of ischemic heart disease and other diseases of the circulatory system: Secondary | ICD-10-CM | POA: Insufficient documentation

## 2019-11-05 DIAGNOSIS — D509 Iron deficiency anemia, unspecified: Secondary | ICD-10-CM | POA: Insufficient documentation

## 2019-11-05 LAB — CBC
HCT: 28.3 % — ABNORMAL LOW (ref 39.0–52.0)
Hemoglobin: 8.5 g/dL — ABNORMAL LOW (ref 13.0–17.0)
MCH: 24.6 pg — ABNORMAL LOW (ref 26.0–34.0)
MCHC: 30 g/dL (ref 30.0–36.0)
MCV: 81.8 fL (ref 80.0–100.0)
Platelets: 503 10*3/uL — ABNORMAL HIGH (ref 150–400)
RBC: 3.46 MIL/uL — ABNORMAL LOW (ref 4.22–5.81)
RDW: 17.2 % — ABNORMAL HIGH (ref 11.5–15.5)
WBC: 8.7 10*3/uL (ref 4.0–10.5)
nRBC: 0 % (ref 0.0–0.2)

## 2019-11-05 LAB — BASIC METABOLIC PANEL
Anion gap: 14 (ref 5–15)
BUN: 60 mg/dL — ABNORMAL HIGH (ref 8–23)
CO2: 24 mmol/L (ref 22–32)
Calcium: 9.1 mg/dL (ref 8.9–10.3)
Chloride: 97 mmol/L — ABNORMAL LOW (ref 98–111)
Creatinine, Ser: 2.52 mg/dL — ABNORMAL HIGH (ref 0.61–1.24)
GFR calc Af Amer: 28 mL/min — ABNORMAL LOW (ref >60)
GFR calc non Af Amer: 24 mL/min — ABNORMAL LOW (ref >60)
Glucose, Bld: 289 mg/dL — ABNORMAL HIGH (ref 70–99)
Potassium: 3.6 mmol/L (ref 3.5–5.1)
Sodium: 135 mmol/L (ref 135–145)

## 2019-11-05 LAB — BRAIN NATRIURETIC PEPTIDE: B Natriuretic Peptide: 714.8 pg/mL — ABNORMAL HIGH (ref 0.0–100.0)

## 2019-11-05 MED ORDER — TORSEMIDE 20 MG PO TABS: ORAL_TABLET | ORAL | 2 refills | Status: DC

## 2019-11-05 MED ORDER — AMIODARONE HCL 200 MG PO TABS: 200.0000 mg | ORAL_TABLET | Freq: Two times a day (BID) | ORAL | 3 refills | Status: DC

## 2019-11-05 MED ORDER — APIXABAN 2.5 MG PO TABS: 2.5000 mg | ORAL_TABLET | Freq: Two times a day (BID) | ORAL | 6 refills | Status: DC

## 2019-11-05 NOTE — Progress Notes (Signed)
ReDS Vest / Clip - 11/05/19 1200      ReDS Vest / Clip   Station Marker  C    Ruler Value  28    ReDS Value Range  (!) High volume overload    ReDS Actual Value  40    Anatomical Comments  sitting

## 2019-11-05 NOTE — Addendum Note (Signed)
Encounter addended by: Consuelo Pandy, PA-C on: 11/05/2019 4:43 PM  Actions taken: Level of Service modified, Diagnosis association updated, Visit diagnoses modified, Flowsheet accepted

## 2019-11-05 NOTE — Progress Notes (Signed)
LATE ENTRY, from 12:30 PM: Zio patch placed onto patient.  All instructions and information reviewed with patient, they verbalize understanding with no questions.

## 2019-11-05 NOTE — Addendum Note (Signed)
Encounter addended by: Scarlette Calico, RN on: 11/05/2019 5:01 PM  Actions taken: Clinical Note Signed

## 2019-11-05 NOTE — Progress Notes (Addendum)
ADVANCED HF CLINIC PROGRESS NOTE  Referring Physician: Primary Care: Merri Ray MD [place]) Primary Cardiologist: Margaretann Loveless [facility]: Dr. Haroldine Laws   HPI:  Adam Reid is a 74 y.o. male from [place] with a h/o a tobacco abuse (quit 2021), HTN, DM2, CKD 4 (Cr ~8.7), diastolic HF, PAF and iron-deficiency anemia, recently referred by Dr. 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 Had initial consultation w/ Dr. Haroldine Laws on 10/15/19.   Had [facility] 01/2016 for CP evaluation revealing mild nonobstructive CAD, LAD 20%, D2 50%, OM 20%  Had COVID in 2019. Not too many symptoms except for fatigue.   Hospitalized 5/64-33/29 for diastolic HF. Diuresed and weight down to 129 pounds. Sent home on lasix 60 daily. Echo LVEF 55-60%, RV ok. Restrictive filling pattern.   Since his hospitalization, he has had issues w/ ongoing fluid overload and was changed from lasix to torsemide. At his initial consult visit on 4/12, he was fluid overloaded and torsemide was increased further to 40 qam/20 qpm.   There was also concern about cardiac amyloidosis given restrictive filling pattern on echo.  Multiple myeloma panel obtained and immunofixation did not demonstrate monoclonal protein spike. PYP completed and negative for TTR amyloid.   He presents back to clinic for f/u. Here with his wife who serves as a Optometrist. He report several medication changes. 1) he self reduced torsemide down from 40/20>>20 mg bid given concerns that he was losing too much wt. 2) Toprol XL was reduced at recent nephrology visit last week due to low BP. He is now on 12.5 mg daily. 3) he has started IV Fe for anemia. He is still awaiting GI w/u for anemia. He was originally scheduled for EGD/colonoscopy on 3/18, but procedure was canceled given admission for a/c CHF at that time.   Today, he is back in atypical atrial flutter by EKG w/ rapid ventricular response in the 130s. SBP 98. Despite arrhymia, he denies any significant  dyspnea w/ basic ADLs. NYHA Class II. Denies palpitations and CP. Also denies abnormal bleeding. No melena or hematochezia. He is volume overloaded w/ 1+ bilateral LEE on exam and ReDs vest measurement of 40%.       Review of Systems: [y] = yes, '[ ]'$  = no   General: Weight gain '[ ]'$ ; Weight loss [ y]; Anorexia y'[ ]'$ ; Fatigue [ y]; Fever '[ ]'$ ; Chills '[ ]'$ ; Weakness '[ ]'$   Cardiac: Chest pain/pressure '[ ]'$ ; Resting SOB '[ ]'$ ; Exertional SOB [ y]; Orthopnea '[ ]'$ ; Pedal Edema [ y]; Palpitations '[ ]'$ ; Syncope '[ ]'$ ; Presyncope '[ ]'$ ; Paroxysmal nocturnal dyspnea'[ ]'$   Pulmonary: Cough '[ ]'$ ; Wheezing'[ ]'$ ; Hemoptysis'[ ]'$ ; Sputum '[ ]'$ ; Snoring '[ ]'$   GI: Vomiting'[ ]'$ ; Dysphagia'[ ]'$ ; Melena'[ ]'$ ; Hematochezia '[ ]'$ ; Heartburn'[ ]'$ ; Abdominal pain '[ ]'$ ; Constipation '[ ]'$ ; Diarrhea '[ ]'$ ; BRBPR '[ ]'$   GU: Hematuria'[ ]'$ ; Dysuria '[ ]'$ ; Nocturia'[ ]'$   Vascular: Pain in legs with walking '[ ]'$ ; Pain in feet with lying flat '[ ]'$ ; Non-healing sores '[ ]'$ ; Stroke '[ ]'$ ; TIA '[ ]'$ ; Slurred speech '[ ]'$ ;  Neuro: Headaches'[ ]'$ ; Vertigo'[ ]'$ ; Seizures'[ ]'$ ; Paresthesias'[ ]'$ ;Blurred vision '[ ]'$ ; Diplopia '[ ]'$ ; Vision changes '[ ]'$   Ortho/Skin: Arthritis [ y]; Joint pain [ y]; Muscle pain '[ ]'$ ; Joint swelling '[ ]'$ ; Back Pain '[ ]'$ ; Rash '[ ]'$   Psych: Depression'[ ]'$ ; Anxiety'[ ]'$   Heme: Bleeding problems '[ ]'$ ; Clotting disorders '[ ]'$ ; Anemia Blue.Reese ]  Endocrine: Diabetes Blue.Reese ]; Thyroid dysfunction'[ ]'$   Past Medical History:  Diagnosis Date  . Chronic kidney disease   . Diabetes mellitus without complication ([place])   . Hyperlipidemia   . Hypertension   . Thyroid disease    was on supplement, taken off by [name]    Current Outpatient Medications  Medication Sig Dispense Refill  . albuterol (VENTOLIN HFA) 108 (90 Base) MCG/ACT inhaler Inhale 2 puffs into the lungs every 6 (six) hours as needed for wheezing or shortness of breath. 18 g 0  . aspirin EC 81 MG EC tablet Take 1 tablet (81 mg total) by mouth daily. 30 tablet 5  . atorvastatin (LIPITOR) 80 MG tablet TAKE 1 TABLET(80 MG) BY  MOUTH DAILY 90 tablet 1  . citalopram (CELEXA) 20 MG tablet TAKE 1 TABLET(20 MG) BY MOUTH DAILY 90 tablet 1  . cyanocobalamin 500 MCG tablet Take 500 mcg by mouth daily. Reported on 12/02/2015    . diltiazem (CARDIZEM CD) 180 MG 24 hr capsule Take 1 capsule (180 mg total) by mouth daily. 30 capsule 2  . glipiZIDE (GLUCOTROL XL) 10 MG 24 hr tablet TAKE 1 TABLET(10 MG) BY MOUTH DAILY WITH BREAKFAST (Patient taking differently: Take 12.5 mg by mouth daily with breakfast. ) 90 tablet 1  . GlucoCom Lancets MISC Use for home glucose monitoring 100 each 3  . glucose blood (ONETOUCH VERIO) test strip Test up to 2 times per day.  Uncontrolled diabetes with hyperglycemia and stage 4 CKD. 100 each 3  . JANUVIA 25 MG tablet TAKE 1 TABLET(25 MG) BY MOUTH DAILY 90 tablet 0  . metoprolol succinate (TOPROL-XL) 25 MG 24 hr tablet Take 12.5 mg by mouth daily.    . Omega 3-6-9 Fatty Acids (OMEGA 3-6-9 COMPLEX PO) Take 1 tablet by mouth daily.    . pioglitazone (ACTOS) 30 MG tablet TAKE 1 TABLET(30 MG) BY MOUTH DAILY 90 tablet 1  . polyethylene glycol (MIRALAX / GLYCOLAX) 17 g packet Take 17 g by mouth daily as needed for mild constipation or moderate constipation.     . senna (SENOKOT) 8.6 MG tablet Take 1 tablet by mouth as needed for constipation.     . tamsulosin (FLOMAX) 0.4 MG CAPS capsule TAKE 2 CAPSULES(0.8 MG) BY MOUTH DAILY 180 capsule 1  . torsemide (DEMADEX) 20 MG tablet Take 20 mg by mouth 2 (two) times daily.    . blood glucose meter kit and supplies Use up to two times daily as directed  ICD10 E10.9 E11.9 1 each 0  . Blood Glucose Monitoring Suppl (ONETOUCH VERIO) w/Device KIT as directed.   0  . nitroGLYCERIN (NITROSTAT) 0.4 MG SL tablet Place 1 tablet (0.4 mg total) under the tongue every 5 (five) minutes x 3 doses as needed for chest pain. (Patient not taking: Reported on 11/05/2019) 25 tablet 2   No current facility-administered medications for this encounter.    No Known Allergies    Social  History   Socioeconomic History  . Marital status: Married    Spouse name: [name]  . Number of children: 1  . Years of education: 12th grade  . Highest education level: Not on file  Occupational History  . Occupation: Retired-accountant  Tobacco Use  . Smoking status: Former Smoker    Packs/day: 0.25    Years: 48.00    Pack years: 12.00    Types: Cigarettes    Quit date: 07/16/2019    Years since quitting: 0.3  . Smokeless tobacco: Never Used  . Tobacco comment: 09/07/16 3-4 cigs  daily, trying to cut back  Substance and Sexual Activity  . Alcohol use: No    Alcohol/week: 0.0 standard drinks  . Drug use: No  . Sexual activity: Never    Partners: Female  Other Topics Concern  . Not on file  Social History Narrative   Originally from [place]. Came to the [place] in 2009, following their son who came here for school.   Married.   Lives with his wife.   Their adult son lives in [place], [place], where he is in optometry school.   Education: [employer]   Exercise: No   Social Determinants of Radio broadcast assistant Strain:   . Difficulty of Paying Living Expenses:   Food Insecurity:   . Worried About Charity fundraiser in the Last Year:   . Arboriculturist in the Last Year:   Transportation Needs:   . Film/video editor (Medical):   [name] Lack of Transportation (Non-Medical):   Physical Activity:   . Days of Exercise per Week:   . Minutes of Exercise per Session:   Stress:   . Feeling of Stress :   Social Connections:   . Frequency of Communication with Friends and Family:   . Frequency of Social Gatherings with Friends and Family:   . Attends Religious Services:   . Active Member of Clubs or Organizations:   . Attends Archivist Meetings:   [name] Marital Status:   Intimate Partner Violence:   . Fear of Current or Ex-Partner:   . Emotionally Abused:   [name] Physically Abused:   . Sexually Abused:       Family History  Problem Relation Age of Onset  . Hypertension  Mother   . Hyperlipidemia Mother   . Hypertension Sister   . Kidney disease Father   . Heart disease Brother 1       open heart surgery  . Colon cancer Neg Hx     Vitals:   11/05/19 1140  BP: 98/70  Pulse: (!) 142  SpO2: 95%  Weight: 60.6 kg (133 lb 9.6 oz)    PHYSICAL EXAM: ReDs Clip 40% General:  Mildly fatigue appearing elderly male. No respiratory difficulty HEENT: normal Neck: supple. no JVD. Carotids 2+ bilat; no bruits. No lymphadenopathy or thyromegaly appreciated. Cor: PMI nondisplaced. Irregular rhythm, tachy rate. No rubs, gallops or murmurs. Lungs: clear, no wheezing  Abdomen: soft, nontender, nondistended. No hepatosplenomegaly. No bruits or masses. Good bowel sounds. Extremities: no cyanosis, clubbing, rash, 1+ bilateral LEE edema Neuro: alert & oriented x 3, cranial nerves grossly intact. moves all 4 extremities w/o difficulty. Affect pleasant.   ECG: Atypical Atrial Flutter 139 bpm Personally reviewed    ASSESSMENT & PLAN:  1. Acute on chronic diastolic HF - Echo 8/24: EF 55-60% with restrictive filling pattern - PYP scan 4/21 negative for TTR amyloid  - based on history, ECG, echo findings (normal RV) and concomitant renal failure, suspect this is due to longstanding HTN +/- DM2 changes  - NYHA II-III with significant volume overload c/b CKD stage 4. ReDs clip 40%. Suspect rapid atrial flutter likely contributing to volume overload. Treatment w/ Actos for management of DM also potential contributor - Increase torsemide back to 40/20 - Needs better rate/rhythm control (see below) - Recommend stopping Actos. Will consult PCP  - Ideally would love to add SGLT2i but with GFR ~25 will need to d/w Renal  - will see back in 2 weeks to f/u  2.  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 Asymptomatic but ongoing fluid overload - difficult situation given anemia w/ pending GI w/u - start Eliquis 2.5 mg bid (denies melena/  hematochezia)  - start amiodarone 200 mg bid - Zio patch x 2 weeks  - BMP today  - recent TSH was WNL   3. HTN - soft but stable, suspect tachycardia playing a role. Denies orthostatic symptoms  4. CKD 4  - due to HTN and DM2 - Followed by Dr. Elmarie Shiley   5. DM2 - per PCP - he is on Actos, which could contribute to volume overload/ worsening CHF. Will forward note to PCP. Strongly recommend that this be discontinued due to CHF - consider SGLT2i as above if GFR permits.   6. IDA - Nephrology following and treating w/ IV Fe infussions - GI also following. Needs EGD/ colonoscopy. Will consult w/ Dr. Silverio Decamp regarding scope. This needs to be done ASAP. - starting low dose Eliquis, 2.5 mg bid, as outlined above for atrial flutter. He will promptly notify us if any melena/ hematochezia - repeat CBC today    7. Non-obstructive CAD - continue statin   F/u in 2 weeks   Lyda Jester, PA-C  12:02 PM   Patient seen and examined with the above-signed Advanced Practice Provider and/or Housestaff. I personally reviewed laboratory data, imaging studies and relevant notes. I independently examined the patient and formulated the important aspects of the plan. I have edited the note to reflect any of my changes or salient points. I have personally discussed the plan with the patient and/or family.  Difficult situation.   Has now developed recurrent atrial tach/atypical flutter with RVR. Has not been on [facility] pending GI w/u for ongoing anemia. Denies melena or BRBPR. Volume status is better but still overloaded with ReDS reading of 40. NYHA III  General:  Elderly. No resp difficulty HEENT: normal Neck: supple. JVP 10 . Carotids 2+ bilat; no bruits. No lymphadenopathy or thryomegaly appreciated. Cor: PMI nondisplaced. Irregular tachy  No rubs, gallops or murmurs. Lungs: clear Abdomen: soft, nontender, nondistended. No hepatosplenomegaly. No bruits or masses. Good bowel  sounds. Extremities: no cyanosis, clubbing, rash, 1-2+ edema Neuro: alert & orientedx3, cranial nerves grossly intact. moves all 4 extremities w/o difficulty. Affect pleasant  He will not tolerate AF with RVR long. Will start amio 200 bid andEliquis 2.5 bid. Watch closely for bleeding. Agree with increasing torsemide for fluid overload. We have reached out to GI to arrange endoscopy in near future. Will need to follow closely. If not slowing down (or converting) with amio will need TEE/DC-CV as long as he is tolerating AC.   Total time spent 45 minutes. Over half that time spent discussing above.   Glori Bickers, MD  10:11 AM

## 2019-11-05 NOTE — Patient Instructions (Signed)
Start Eliquis 2.5 mg Twice daily   Start Amiodarone 200 mg Twice daily   Increase Torsemide to 40 mg (2 tabs) in AM and 20 mg (1 tab) in PM  Labs done today, your results will be available in MyChart, we will contact you for abnormal readings.  Your provider has recommended that  you wear a Zio Patch for 14 days.  This monitor will record your heart rhythm for our review.  IF you have any symptoms while wearing the monitor please press the button.  If you have any issues with the patch or you notice a red or orange light on it please call the company at [phone].  Once you remove the patch please mail it back to the company as soon as possible so we can get the results.  Your physician recommends that you schedule a follow-up appointment in: 2-3 weeks  If you have any questions or concerns before your next appointment please send [place] a message through [place] or call our office at [phone].  At the [facility], you and your health needs are our priority. As part of our continuing mission to provide you with exceptional heart care, we have created designated Provider Care Teams. These Care Teams include your primary Cardiologist (physician) and Advanced Practice Providers (APPs- Physician Assistants and Nurse Practitioners) who all work together to provide you with the care you need, when you need it.   You may see any of the following providers on your designated Care Team at your next follow up: [name] Dr Glori Bickers . Dr Loralie Champagne . Darrick Grinder, NP . Lyda Jester, PA . Audry Riles, PharmD   Please be sure to bring in all your medications bottles to every appointment.

## 2019-11-06 ENCOUNTER — Telehealth: Payer: Self-pay

## 2019-11-06 NOTE — Addendum Note (Signed)
Encounter addended by: Jolaine Artist, MD on: 11/06/2019 10:11 AM  Actions taken: Clinical Note Signed, Level of Service modified

## 2019-11-06 NOTE — Telephone Encounter (Signed)
-----   Message from Mauri Pole, MD sent at 11/06/2019  7:55 AM EDT ----- Regarding: RE: GI Evaluation Ok, please convert this to telephone msg. Thanks ----- Message ----- From: Greggory Keen, LPN Sent: [id number]   5:25 PM EDT To: Mauri Pole, MD Subject: RE: GI Evaluation                              I spoke with the lady who is taking care of getting him to his appointments. Offered 11/07/19. She has had to take "many days off of my work" and this is not a good day for her. She is able to bring him in on 11/13/19 with Ellouise Newer. Tuesdays are her scheduled day off.  ----- Message ----- From: Mauri Pole, MD Sent: 11/05/2019   4:34 PM EDT To: Greggory Keen, LPN, # Subject: RE: GI Evaluation                              Ok, we will bring him in for a visit soon to discuss EGD and colonoscopy.  Beth, can you please schedule next available with me or APP soon. Thanks ----- Message ----- From: Consuelo Pandy, PA-C Sent: 11/05/2019  12:53 PM EDT To: Jolaine Artist, MD, Mauri Pole, MD Subject: GI Evaluation                                  Hi Dr. Silverio Decamp,   Following up in regards to this mutual patient. He is needing GI w/u given anemia and was scheduled for an EGD/colonoscopy on 3/18 but procedure was canceled due to his cardiac issues at the time. He is now in atrial flutter and needs anticoagulation for stoke prophylaxis. He denies melena/ hematochezia. Nephology has been treating w/ IV iron and his hgb has been trending up, most recent was 8.4. I am rechecking a CBC today.   I have discussed w/ Dr.  Haroldine Laws and we have elected to start him on low dose Eliquis, 2.5 mg bid along w/ amiodarone. He will notify us if any melena and we feel that he should go ahead w/ EGD/ colonoscopy as soon as possible.   Thanks, Lyda Jester, PA-C

## 2019-11-08 ENCOUNTER — Other Ambulatory Visit (HOSPITAL_COMMUNITY): Payer: Self-pay

## 2019-11-09 ENCOUNTER — Other Ambulatory Visit: Payer: Self-pay

## 2019-11-09 ENCOUNTER — Encounter (HOSPITAL_COMMUNITY)
Admission: RE | Admit: 2019-11-09 | Discharge: 2019-11-09 | Disposition: A | Discharge status: Home / Self Care | Payer: Medicare Other | Source: Ambulatory Visit | Attending: Nephrology | Admitting: Nephrology

## 2019-11-09 VITALS — BP 109/63 | HR 63 | Temp 97.4°F | Resp 20

## 2019-11-09 DIAGNOSIS — N179 Acute kidney failure, unspecified: Secondary | ICD-10-CM | POA: Insufficient documentation

## 2019-11-09 LAB — POCT HEMOGLOBIN-HEMACUE: Hemoglobin: 8.2 g/dL — ABNORMAL LOW (ref 13.0–17.0)

## 2019-11-09 MED ORDER — EPOETIN ALFA-EPBX 10000 UNIT/ML IJ SOLN
INTRAMUSCULAR | Status: AC
  Filled 2019-11-09: qty 1

## 2019-11-09 MED ORDER — SODIUM CHLORIDE 0.9 % IV SOLN
510.0000 mg | INTRAVENOUS | Status: DC
  Administered 2019-11-09: 510 mg via INTRAVENOUS
  Filled 2019-11-09: qty 17

## 2019-11-09 MED ORDER — EPOETIN ALFA-EPBX 10000 UNIT/ML IJ SOLN
10000.0000 [IU] | INTRAMUSCULAR | Status: DC
  Administered 2019-11-09: 10000 [IU] via SUBCUTANEOUS

## 2019-11-09 NOTE — Discharge Instructions (Signed)

## 2019-11-13 ENCOUNTER — Encounter: Payer: Self-pay | Admitting: Physician Assistant

## 2019-11-13 ENCOUNTER — Telehealth: Payer: Self-pay | Admitting: *Deleted

## 2019-11-13 ENCOUNTER — Ambulatory Visit (INDEPENDENT_AMBULATORY_CARE_PROVIDER_SITE_OTHER): Payer: Medicare Other | Admitting: Physician Assistant

## 2019-11-13 VITALS — BP 120/60 | HR 72 | Temp 98.0°F | Ht 66.0 in | Wt 132.2 lb

## 2019-11-13 DIAGNOSIS — D509 Iron deficiency anemia, unspecified: Secondary | ICD-10-CM

## 2019-11-13 DIAGNOSIS — R634 Abnormal weight loss: Secondary | ICD-10-CM

## 2019-11-13 DIAGNOSIS — I2510 Atherosclerotic heart disease of native coronary artery without angina pectoris: Secondary | ICD-10-CM | POA: Diagnosis not present

## 2019-11-13 NOTE — Telephone Encounter (Signed)
Ok to hold eliquis for 2 days and proceed with EGD asap.  thanks

## 2019-11-13 NOTE — Progress Notes (Signed)
Chief Complaint: Iron deficiency anemia  HPI:    Mr. Adam Reid is a 74 year old [place] male, known to Dr. Silverio Decamp, with a past medical history of hypertension, diabetes type 2, CKD stage IV, iron deficiency anemia which required Feraheme infusions in 2019, BPH, colon polyps and others listed below, who returns to clinic today for follow-up of his iron deficiency anemia.    07/2015 colonoscopy with Dr. Silverio Decamp with 2 tubular adenomatous polyps and internal hemorrhoids.    08/10/2019 patient seen in clinic by Carl Best, NP for his IDA.  (Please see that note for further detail).  At that time recommend the patient have an EGD and colonoscopy.  At that time could not schedule due to a new heart murmur on exam.  His heart rhythm was also slightly irregular.  It was noted he had a history of CAD with an echo 2017 with LVEF 45-50%.  He was referred to cardiology as well.  Repeat hemoglobin at that time was 8.3 from 8.9 (hemoglobin 10.3 on 03/13/2019).    09/25/2019 patient was admitted to the hospital for acute CHF.  He had previously been scheduled for EGD and colonoscopy with Dr. Silverio Decamp on 3/18.  These were rescheduled.    11/06/2019 we were contacted by Dr. Haroldine Laws.  It was noted that he is in a flutter and needs anticoagulation for stroke prophylaxis.  He has been treated with IV iron and his hemoglobin is trending up, most recently 8.4.  He was started on Eliquis 2.5 mg twice daily along with amiodarone.  And it was recommended that he have an EGD and colonoscopy as soon as possible.    11/09/2019 hemoglobin 8.2 (8.5 on 4/26).    Today, the patient presents to clinic with his wife who acts as his Optometrist.  They explain that other than his recent hospitalization for CHF and the addition of a blood thinner and diuretic nothing else has changed with his GI symptoms.  (Again see Colleen's full note from January).  He does report a 30 pound weight loss over the past 3 months.    Denies fever,  chills or blood in his stool.  Pertinent history: Colonoscopy 07/22/2015 by Dr. Silverio Decamp: 1. Semi-pedunculated polyp ranging from 12 to 64m in size was found in the sigmoid colon; polypectomies were performed using snare cautery 2. 5-6 mm polyp in the ascending colon removed by cold snare, 2-3 mm polyp in the ascending colon removed by biopsy forceps 3. Moderate sized internal hemorrhoids  Coronary angiogram 01/29/2016: Ramus lesion 20% stenosis, 50% stenosis ostial second diagonal, 20% mid LAD, normal LVEDP  Past Medical History:  Diagnosis Date  . Chronic kidney disease   . Diabetes mellitus without complication ([facility]   . Hyperlipidemia   . Hypertension   . Thyroid disease    was on supplement, taken off by Dr GElder Cyphers   Past Surgical History:  Procedure Laterality Date  . APPENDECTOMY    . CARDIAC CATHETERIZATION N/A 01/30/2016   Procedure: Left Heart Cath and Coronary Angiography;  Surgeon: JJettie Booze MD;  [facility];  Service: Cardiovascular;  Laterality: N/A;    Current Outpatient Medications  Medication Sig Dispense Refill  . albuterol (VENTOLIN HFA) 108 (90 Base) MCG/ACT inhaler Inhale 2 puffs into the lungs every 6 (six) hours as needed for wheezing or shortness of breath. 18 g 0  . amiodarone (PACERONE) 200 MG tablet Take 1 tablet (200 mg total) by mouth 2 (two) times daily. 60 tablet  3  . apixaban (ELIQUIS) 2.5 MG TABS tablet Take 1 tablet (2.5 mg total) by mouth 2 (two) times daily. 60 tablet 6  . aspirin EC 81 MG EC tablet Take 1 tablet (81 mg total) by mouth daily. 30 tablet 5  . atorvastatin (LIPITOR) 80 MG tablet TAKE 1 TABLET(80 MG) BY MOUTH DAILY 90 tablet 1  . blood glucose meter kit and supplies Use up to two times daily as directed  ICD10 E10.9 E11.9 1 each 0  . Blood Glucose Monitoring Suppl (ONETOUCH VERIO) w/Device KIT as directed.   0  . citalopram (CELEXA) 20 MG tablet TAKE 1 TABLET(20 MG) BY MOUTH DAILY 90 tablet 1  .  cyanocobalamin 500 MCG tablet Take 500 mcg by mouth daily. Reported on 12/02/2015    . diltiazem (CARDIZEM CD) 180 MG 24 hr capsule Take 1 capsule (180 mg total) by mouth daily. 30 capsule 2  . glipiZIDE (GLUCOTROL XL) 10 MG 24 hr tablet TAKE 1 TABLET(10 MG) BY MOUTH DAILY WITH BREAKFAST (Patient taking differently: Take 12.5 mg by mouth daily with breakfast. ) 90 tablet 1  . GlucoCom Lancets MISC Use for home glucose monitoring 100 each 3  . glucose blood (ONETOUCH VERIO) test strip Test up to 2 times per day.  Uncontrolled diabetes with hyperglycemia and stage 4 CKD. 100 each 3  . JANUVIA 25 MG tablet TAKE 1 TABLET(25 MG) BY MOUTH DAILY 90 tablet 0  . metoprolol succinate (TOPROL-XL) 25 MG 24 hr tablet Take 12.5 mg by mouth daily.    . nitroGLYCERIN (NITROSTAT) 0.4 MG SL tablet Place 1 tablet (0.4 mg total) under the tongue every 5 (five) minutes x 3 doses as needed for chest pain. (Patient not taking: Reported on 11/05/2019) 25 tablet 2  . Omega 3-6-9 Fatty Acids (OMEGA 3-6-9 COMPLEX PO) Take 1 tablet by mouth daily.    . pioglitazone (ACTOS) 30 MG tablet TAKE 1 TABLET(30 MG) BY MOUTH DAILY 90 tablet 1  . polyethylene glycol (MIRALAX / GLYCOLAX) 17 g packet Take 17 g by mouth daily as needed for mild constipation or moderate constipation.     . senna (SENOKOT) 8.6 MG tablet Take 1 tablet by mouth as needed for constipation.     . tamsulosin (FLOMAX) 0.4 MG CAPS capsule TAKE 2 CAPSULES(0.8 MG) BY MOUTH DAILY 180 capsule 1  . torsemide (DEMADEX) 20 MG tablet Take 2 tablets (40 mg total) by mouth in the morning AND 1 tablet (20 mg total) every evening. 90 tablet 2   No current facility-administered medications for this visit.    Allergies as of 11/13/2019  . (No Known Allergies)    Family History  Problem Relation Age of Onset  . Hypertension Mother   . Hyperlipidemia Mother   . Hypertension Sister   . Kidney disease Father   . Heart disease Brother 38       open heart surgery  . Colon  cancer Neg Hx     Social History   Socioeconomic History  . Marital status: Married    Spouse name: [name]  . Number of children: 1  . Years of education: 12th grade  . Highest education level: Not on file  Occupational History  . Occupation: Retired-accountant  Tobacco Use  . Smoking status: Former Smoker    Packs/day: 0.25    Years: 48.00    Pack years: 12.00    Types: Cigarettes    Quit date: 07/16/2019    Years since quitting: 0.3  . Smokeless  tobacco: Never Used  . Tobacco comment: 09/07/16 3-4 cigs daily, trying to cut back  Substance and Sexual Activity  . Alcohol use: No    Alcohol/week: 0.0 standard drinks  . Drug use: No  . Sexual activity: Never    Partners: Female  Other Topics Concern  . Not on file  Social History Narrative   Originally from [place]. Came to the [place] in 2009, following their son who came here for school.   Married.   Lives with his wife.   Their adult son lives in [place], [place], where he is in optometry school.   Education: [employer]   Exercise: No   Social Determinants of Radio broadcast assistant Strain:   . Difficulty of Paying Living Expenses:   Food Insecurity:   . Worried About Charity fundraiser in the Last Year:   . Arboriculturist in the Last Year:   Transportation Needs:   . Film/video editor (Medical):   [name] Lack of Transportation (Non-Medical):   Physical Activity:   . Days of Exercise per Week:   . Minutes of Exercise per Session:   Stress:   . Feeling of Stress :   Social Connections:   . Frequency of Communication with Friends and Family:   . Frequency of Social Gatherings with Friends and Family:   . Attends Religious Services:   . Active Member of Clubs or Organizations:   . Attends Archivist Meetings:   [name] Marital Status:   Intimate Partner Violence:   . Fear of Current or Ex-Partner:   . Emotionally Abused:   [name] Physically Abused:   . Sexually Abused:     Review of Systems:    Constitutional:  No weight loss, fever or chills Cardiovascular: No chest pain Respiratory: No SOB  Gastrointestinal: See HPI and otherwise negative   Physical Exam:  Vital signs: BP 120/60   Pulse 72   Temp 98 F (36.7 C)   Ht '5\' 6"'$  (1.676 m)   Wt 132 lb 3.2 oz (60 kg)   BMI 21.34 kg/m   Constitutional:   Pleasant male appears to be in NAD, Well developed, Well nourished, alert and cooperative Respiratory: Respirations even and unlabored. Lungs clear to auscultation bilaterally.   No wheezes, crackles, or rhonchi.  Cardiovascular: Normal S1, S2. No MRG. Regular rate and rhythm. No peripheral edema, cyanosis or pallor.  Gastrointestinal:  Soft, nondistended, nontender. No rebound or guarding. Normal bowel sounds. No appreciable masses or hepatomegaly. Psychiatric: Demonstrates good judgement and reason without abnormal affect or behaviors.  RELEVANT LABS AND IMAGING: CBC    Component Value Date/Time   WBC 8.7 11/05/2019 1230   RBC 3.46 (L) 11/05/2019 1230   HGB 8.2 (L) 11/09/2019 1443   HGB 9.1 (L) 09/14/2019 1555   HCT 28.3 (L) 11/05/2019 1230   HCT 28.4 (L) 09/14/2019 1555   PLT 503 (H) 11/05/2019 1230   PLT 464 (H) 09/14/2019 1555   MCV 81.8 11/05/2019 1230   MCV 82 09/14/2019 1555   MCH 24.6 (L) 11/05/2019 1230   MCHC 30.0 11/05/2019 1230   RDW 17.2 (H) 11/05/2019 1230   RDW 16.4 (H) 09/14/2019 1555   LYMPHSABS 0.7 09/23/2019 2338   LYMPHSABS 0.6 (L) 10/05/2017 1433   MONOABS 0.9 09/23/2019 2338   EOSABS 0.0 09/23/2019 2338   EOSABS 0.1 10/05/2017 1433   BASOSABS 0.0 09/23/2019 2338   BASOSABS 0.0 10/05/2017 1433    CMP  Component Value Date/Time   NA 135 11/05/2019 1230   NA 135 10/03/2019 0836   K 3.6 11/05/2019 1230   CL 97 (L) 11/05/2019 1230   CO2 24 11/05/2019 1230   GLUCOSE 289 (H) 11/05/2019 1230   BUN 60 (H) 11/05/2019 1230   BUN 73 (H) 10/03/2019 0836   CREATININE 2.52 (H) 11/05/2019 1230   CREATININE 2.68 (H) 03/26/2016 1839   CALCIUM 9.1 11/05/2019 1230     PROT 6.9 08/27/2019 0850   ALBUMIN 3.8 07/20/2019 1406   AST 15 07/20/2019 1406   ALT 13 07/20/2019 1406   ALKPHOS 91 07/20/2019 1406   BILITOT 0.3 07/20/2019 1406   GFRNONAA 24 (L) 11/05/2019 1230   GFRAA 28 (L) 11/05/2019 1230    Assessment: 1.  IDA: Chronic kidney disease, acute on chronic iron deficiency with no evidence of active GI bleeding, received IV Feraheme infusions to his nephrologist, the next one is on this Friday; consider GI source of blood loss versus other 2.  Weight loss: 30 pounds over the past 3 months per patient, recent admission for CHF with addition of diuretic which could be contributing  Plan: 1.  Scheduled patient for procedures with Dr. Lyndel Safe next Tuesday (Tuesdays are the patient's wife's day off and are best for her and Dr. Silverio Decamp did not have anything until June).  Did discuss risks, benefits, limitations and alternatives and the patient agrees to proceed.  He had a second Covid vaccine greater than 2 weeks ago.  These were scheduled in the [place]. 2.  Patient was advised to hold his Eliquis for 2 days prior to time of procedures.  We will communicate with Dr. Haroldine Laws who prescribes this medication for him to ensure that holding this medication is acceptable. 3.  Patient will continue to follow with Dr. Silverio Decamp after time of procedures as she is his primary GI physician. 4.  Continue to follow with nephrology in regards to iron infusions 5.  Please await any further recommendations after time of procedures.  Ellouise Newer, PA-C [facility] 11/13/2019, 1:48 PM  Cc: [name], MD

## 2019-11-13 NOTE — Patient Instructions (Signed)
If you are age 73 or older, your body mass index should be between 23-30. Your Body mass index is 21.34 kg/m. If this is out of the aforementioned range listed, please consider follow up with your Primary Care Provider.  If you are age 2 or younger, your body mass index should be between 19-25. Your Body mass index is 21.34 kg/m. If this is out of the aformentioned range listed, please consider follow up with your Primary Care Provider.   You have been scheduled for an endoscopy and colonoscopy. Please follow the written instructions given to you at your visit today. Please pick up your prep supplies at the pharmacy within the next 1-3 days. If you use inhalers (even only as needed), please bring them with you on the day of your procedure.

## 2019-11-13 NOTE — Progress Notes (Addendum)
Reviewed and agree with documentation and assessment and plan. K. Veena Male Minish , MD   

## 2019-11-13 NOTE — Telephone Encounter (Signed)
[facility] Pre-operative Risk Assessment     Request for surgical clearance:     Endoscopy Procedure  What type of surgery is being performed?     EGD/Colonoscopy  When is this surgery scheduled?     Tuesday 11/20/19  What type of clearance is required ?   Pharmacy  Are there any medications that need to be held prior to surgery and how long? Eliquis 2 days  Practice name and name of physician performing surgery?  Jackquline Denmark, MD    [facility]  What is your office phone and fax number?      Phone- [phone]  [phone]  Anesthesia type (None, local, MAC, general) ?       MAC

## 2019-11-14 NOTE — Telephone Encounter (Signed)
   Primary Cardiologist: Elouise Munroe, MD  Chart reviewed as part of pre-operative protocol coverage.  Adam Reid was last seen on 11/05/19 by Dr. Haroldine Laws. At this visit it was noted he would need an urgent EGD/colonscopy. This was discussed with the patient via Dr. Haroldine Laws. Pre op has now be contacted in regards to clearance.   Per Dr. Haroldine Laws he is cleared to proceed with procedure ASAP and will need to hold Eliquis 2 days prior to procedure.   Therefore, based on ACC/AHA guidelines, the patient would be at acceptable risk for the planned procedure without further cardiovascular testing.   I will route this recommendation to the requesting party via Epic fax function and remove from pre-op pool.  Please call with questions.  Reino Bellis, NP 11/14/2019, 7:37 AM

## 2019-11-15 NOTE — Telephone Encounter (Signed)
Informed patient wife on instructions to hold Eliquis. Patient's wife voiced understanding.

## 2019-11-16 ENCOUNTER — Other Ambulatory Visit: Payer: Self-pay

## 2019-11-16 ENCOUNTER — Ambulatory Visit (HOSPITAL_COMMUNITY)
Admission: RE | Admit: 2019-11-16 | Discharge: 2019-11-16 | Disposition: A | Discharge status: Home / Self Care | Payer: Medicare Other | Source: Ambulatory Visit | Attending: Nephrology | Admitting: Nephrology

## 2019-11-16 DIAGNOSIS — N189 Chronic kidney disease, unspecified: Secondary | ICD-10-CM | POA: Insufficient documentation

## 2019-11-16 DIAGNOSIS — D631 Anemia in chronic kidney disease: Secondary | ICD-10-CM | POA: Insufficient documentation

## 2019-11-16 MED ORDER — SODIUM CHLORIDE 0.9 % IV SOLN
510.0000 mg | INTRAVENOUS | Status: DC
  Administered 2019-11-16: 510 mg via INTRAVENOUS
  Filled 2019-11-16: qty 17

## 2019-11-20 ENCOUNTER — Other Ambulatory Visit: Payer: Self-pay

## 2019-11-20 ENCOUNTER — Ambulatory Visit (AMBULATORY_SURGERY_CENTER): Payer: Medicare Other | Admitting: Gastroenterology

## 2019-11-20 ENCOUNTER — Encounter: Payer: Self-pay | Admitting: Gastroenterology

## 2019-11-20 VITALS — BP 127/76 | HR 79 | Temp 97.3°F | Resp 15 | Ht 66.0 in | Wt 132.0 lb

## 2019-11-20 DIAGNOSIS — K3189 Other diseases of stomach and duodenum: Secondary | ICD-10-CM

## 2019-11-20 DIAGNOSIS — D509 Iron deficiency anemia, unspecified: Secondary | ICD-10-CM | POA: Diagnosis not present

## 2019-11-20 DIAGNOSIS — K2950 Unspecified chronic gastritis without bleeding: Secondary | ICD-10-CM | POA: Diagnosis not present

## 2019-11-20 DIAGNOSIS — K2270 Barrett's esophagus without dysplasia: Secondary | ICD-10-CM

## 2019-11-20 DIAGNOSIS — D123 Benign neoplasm of transverse colon: Secondary | ICD-10-CM | POA: Diagnosis not present

## 2019-11-20 DIAGNOSIS — D125 Benign neoplasm of sigmoid colon: Secondary | ICD-10-CM

## 2019-11-20 DIAGNOSIS — D128 Benign neoplasm of rectum: Secondary | ICD-10-CM

## 2019-11-20 DIAGNOSIS — K2980 Duodenitis without bleeding: Secondary | ICD-10-CM | POA: Diagnosis not present

## 2019-11-20 DIAGNOSIS — D129 Benign neoplasm of anus and anal canal: Secondary | ICD-10-CM

## 2019-11-20 DIAGNOSIS — D122 Benign neoplasm of ascending colon: Secondary | ICD-10-CM | POA: Diagnosis not present

## 2019-11-20 MED ORDER — SODIUM CHLORIDE 0.9 % IV SOLN: 500.0000 mL | Freq: Once | INTRAVENOUS | Status: DC

## 2019-11-20 NOTE — Op Note (Signed)
[facility] Patient Name: Adam Reid Procedure Date: 11/20/2019 3:12 PM MRN: [id number] Endoscopist: Jackquline Denmark , MD Age: 74 Referring MD:  Date of Birth: 1946/06/25 Gender: Male Account #: [id number] Procedure:                Colonoscopy Indications:              High risk colon cancer surveillance: Personal                            history of colonic polyps. IDA Medicines:                Monitored Anesthesia Care Procedure:                Pre-Anesthesia Assessment:                           - Prior to the procedure, a History and Physical                            was performed, and patient medications and                            allergies were reviewed. The patient's tolerance of                            previous anesthesia was also reviewed. The risks                            and benefits of the procedure and the sedation                            options and risks were discussed with the patient.                            All questions were answered, and informed consent                            was obtained. Prior Anticoagulants: The patient has                            taken Eliquis (apixaban), last dose was 2 days                            prior to procedure. ASA Grade Assessment: III - A                            patient with severe systemic disease. After                            reviewing the risks and benefits, the patient was                            deemed in satisfactory condition to undergo the  procedure.                           After obtaining informed consent, the colonoscope                            was passed under direct vision. Throughout the                            procedure, the patient's blood pressure, pulse, and                            oxygen saturations were monitored continuously. The                            Colonoscope was introduced through the anus and        advanced to the 2 cm into the ileum. The                            colonoscopy was performed without difficulty. The                            patient tolerated the procedure well. The quality                            of the bowel preparation was adequate to identify                            polyps. There was some retained solid vegetable                            material which would clog the suction channel of                            the scope. The terminal ileum, ileocecal valve,                            appendiceal orifice, and rectum were photographed. Scope In: 3:32:55 PM Scope Out: 4:02:34 PM Scope Withdrawal Time: 0 hours 23 minutes 57 seconds  Total Procedure Duration: 0 hours 29 minutes 39 seconds  Findings:                 Three sessile polyps were found in the rectum,                            distal transverse colon and ascending colon. The                            polyps were 6 to 8 mm in size. These polyps were                            removed with a cold snare. Resection and retrieval  were complete.                           A 2 mm polyp was found in the mid sigmoid colon.                            The polyp was sessile. The polyp was removed with a                            cold biopsy forceps. Resection and retrieval were                            complete.                           A few small-mouthed diverticula were found in the                            sigmoid colon and few rare diverticula in the                            ascending colon.                           Non-bleeding internal hemorrhoids were found during                            retroflexion. The hemorrhoids were moderate.                           The terminal ileum appeared normal.                           The exam was otherwise without abnormality on                            direct and retroflexion views. Complications:            No  immediate complications. Estimated Blood Loss:     Estimated blood loss: none. Impression:               -Colonic polyps s/p polypectomy.                           -Mild predominantly sigmoid diverticulosis.                           -Non-bleeding internal hemorrhoids.                           -Otherwise grossly normal colonoscopy to TI. Recommendation:           - Patient has a contact number available for                            emergencies. The signs and symptoms of potential  delayed complications were discussed with the                            patient. Return to normal activities tomorrow.                            Written discharge instructions were provided to the                            patient.                           - Resume previous diet.                           - Continue present medications.                           - Await pathology results.                           - Resume Eliquis (apixaban) at prior dose tomorrow.                           - Repeat colonoscopy for surveillance based on                            pathology results.                           - Return to GI clinic in 6 weeks with Ellouise Newer PA Jackquline Denmark, MD 11/20/2019 4:13:31 PM This report has been signed electronically.

## 2019-11-20 NOTE — Progress Notes (Signed)
Called to room to assist during endoscopic procedure.  Patient ID and intended procedure confirmed with present staff. Received instructions for my participation in the procedure from the performing physician.  

## 2019-11-20 NOTE — Progress Notes (Signed)
To PACU, VSS. Report to [email or web address] 

## 2019-11-20 NOTE — Patient Instructions (Addendum)
Handout for hemorrhoids, diverticulosis and polyps given.  No NSAIDS (Non-Steroidal anti-inflammatory drugs) .  (These include, aspirin, aspirin-containing products, ibuprofen, advil, motrin, naproxen, aleve, goody powders, etc) Tylenol is ok to take as needed, see label for instructions.  Resume Eliquis at previous dose tomorrow 11/21/19.   YOU HAD AN ENDOSCOPIC PROCEDURE TODAY AT [facility]:   Refer to the procedure report that was given to you for any specific questions about what was found during the examination.  If the procedure report does not answer your questions, please call your gastroenterologist to clarify.  If you requested that your care partner not be given the details of your procedure findings, then the procedure report has been included in a sealed envelope for you to review at your convenience later.  YOU SHOULD EXPECT: Some feelings of bloating in the abdomen. Passage of more gas than usual.  Walking can help get rid of the air that was put into your GI tract during the procedure and reduce the bloating. If you had a lower endoscopy (such as a colonoscopy or flexible sigmoidoscopy) you may notice spotting of blood in your stool or on the toilet paper. If you underwent a bowel prep for your procedure, you may not have a normal bowel movement for a few days.  Please Note:  You might notice some irritation and congestion in your nose or some drainage.  This is from the oxygen used during your procedure.  There is no need for concern and it should clear up in a day or so.  SYMPTOMS TO REPORT IMMEDIATELY:   Following lower endoscopy (colonoscopy or flexible sigmoidoscopy):  Excessive amounts of blood in the stool  Significant tenderness or worsening of abdominal pains  Swelling of the abdomen that is new, acute  Fever of 100F or higher   Following upper endoscopy (EGD)  Vomiting of blood or coffee ground material  New chest pain or pain under the shoulder  blades  Painful or persistently difficult swallowing  New shortness of breath  Fever of 100F or higher  Black, tarry-looking stools  For urgent or emergent issues, a gastroenterologist can be reached at any hour by calling [phone]. Do not use MyChart messaging for urgent concerns.    DIET:  We do recommend a small meal at first, but then you may proceed to your regular diet.  Drink plenty of fluids but you should avoid alcoholic beverages for 24 hours.  ACTIVITY:  You should plan to take it easy for the rest of today and you should NOT DRIVE or use heavy machinery until tomorrow (because of the sedation medicines used during the test).    FOLLOW UP: Our staff will call the number listed on your records 48-72 hours following your procedure to check on you and address any questions or concerns that you may have regarding the information given to you following your procedure. If we do not reach you, we will leave a message.  We will attempt to reach you two times.  During this call, we will ask if you have developed any symptoms of COVID 19. If you develop any symptoms (ie: fever, flu-like symptoms, shortness of breath, cough etc.) before then, please call [phone].  If you test positive for Covid 19 in the 2 weeks post procedure, please call and report this information to [place].    If any biopsies were taken you will be contacted by phone or by letter within the next 1-3 weeks.  Please call  us at [phone] if you have not heard about the biopsies in 3 weeks.    SIGNATURES/CONFIDENTIALITY: You and/or your care partner have signed paperwork which will be entered into your electronic medical record.  These signatures attest to the fact that that the information above on your After Visit Summary has been reviewed and is understood.  Full responsibility of the confidentiality of this discharge information lies with you and/or your care-partner.

## 2019-11-20 NOTE — Progress Notes (Signed)
DT- vitals LS- temp

## 2019-11-20 NOTE — Op Note (Signed)
[facility] Patient Name: Adam Reid Procedure Date: 11/20/2019 3:12 PM MRN: [id number] Endoscopist: Jackquline Denmark , MD Age: 74 Referring MD:  Date of Birth: 06-13-1946 Gender: Male Account #: [id number] Procedure:                Upper GI endoscopy Indications:              Epigastric abdominal pain. IDA Medicines:                Monitored Anesthesia Care Procedure:                Pre-Anesthesia Assessment:                           - Prior to the procedure, a History and Physical                            was performed, and patient medications and                            allergies were reviewed. The patient's tolerance of                            previous anesthesia was also reviewed. The risks                            and benefits of the procedure and the sedation                            options and risks were discussed with the patient.                            All questions were answered, and informed consent                            was obtained. Prior Anticoagulants: The patient has                            taken Eliquis (apixaban), last dose was 2 days                            prior to procedure. ASA Grade Assessment: III - A                            patient with severe systemic disease. After                            reviewing the risks and benefits, the patient was                            deemed in satisfactory condition to undergo the                            procedure.  After obtaining informed consent, the endoscope was                            passed under direct vision. Throughout the                            procedure, the patient's blood pressure, pulse, and                            oxygen saturations were monitored continuously. The                            Endoscope was introduced through the mouth, and                            advanced to the second part of duodenum. The upper                  GI endoscopy was accomplished without difficulty.                            The patient tolerated the procedure well. Scope In: Scope Out: Findings:                 The Z-line was irregular and was found 40 cm from                            the incisors with healed distal esophageal                            erosions. Biopsies were taken with a cold forceps                            for histology directed by NBI. Benign submucosal                            incidental 1 cm lesion was noted in the mid                            esophagus 30 cm from the incisors.                           The entire examined stomach was normal. Biopsies                            were taken with a cold forceps for histology.                           The examined duodenum was normal. Biopsies for                            histology were taken with a cold forceps for                            evaluation of  celiac disease. Complications:            No immediate complications. Estimated Blood Loss:     Estimated blood loss: none. Impression:               - Z-line irregular, 40 cm from the incisors.                            Biopsied.                           - Incidental submucosal mid esophageal lesion                           - Otherwise normal EGD. Recommendation:           - Patient has a contact number available for                            emergencies. The signs and symptoms of potential                            delayed complications were discussed with the                            patient. Return to normal activities tomorrow.                            Written discharge instructions were provided to the                            patient.                           - Resume previous diet.                           - Continue present medications.                           - Await pathology results.                           - Avoid ibuprofen, naproxen, or other  non-steroidal                            anti-inflammatory drugs.                           - If esophageal lesion needs to be further                            investigated, would recommend EUS. Certainly, not                            causing any problems.                           - Proceed  with colonoscopy. Jackquline Denmark, MD 11/20/2019 4:07:25 PM This report has been signed electronically.

## 2019-11-22 ENCOUNTER — Telehealth: Payer: Self-pay

## 2019-11-22 ENCOUNTER — Telehealth: Payer: Self-pay | Admitting: *Deleted

## 2019-11-22 NOTE — Telephone Encounter (Signed)
  Follow up Call-  Call back number 11/20/2019  Post procedure Call Back phone  # [phone] (wife) translater for patient  Permission to leave phone message Yes  Some recent data might be hidden     Patient questions:  Message left to call us if necessary.

## 2019-11-22 NOTE — Telephone Encounter (Signed)
  Follow up Call-  Call back number 11/20/2019  Post procedure Call Back phone  # [phone] (wife) translater for patient  Permission to leave phone message Yes  Some recent data might be hidden     Patient questions:  Do you have a fever, pain , or abdominal swelling? No. Pain Score  0 *  Have you tolerated food without any problems? Yes  Have you been able to return to your normal activities? Yes.    Do you have any questions about your discharge instructions: Diet   No. Medications  No. Follow up visit  No.  Do you have questions or concerns about your Care? No.  Actions: * If pain score is 4 or above: No action needed, pain <4. 1. Have you developed a fever since your procedure? no  2.   Have you had an respiratory symptoms (SOB or cough) since your procedure? no  3.   Have you tested positive for COVID 19 since your procedure no  4.   Have you had any family members/close contacts diagnosed with the COVID 19 since your procedure?  no   If yes to any of these questions please route to Joylene John, RN and Erenest Rasher, RN

## 2019-11-25 ENCOUNTER — Other Ambulatory Visit: Payer: Self-pay | Admitting: Family Medicine

## 2019-11-25 DIAGNOSIS — R739 Hyperglycemia, unspecified: Secondary | ICD-10-CM

## 2019-11-25 DIAGNOSIS — E1122 Type 2 diabetes mellitus with diabetic chronic kidney disease: Secondary | ICD-10-CM

## 2019-11-26 NOTE — Progress Notes (Signed)
ADVANCED HF CLINIC PROGRESS NOTE  Referring Physician: Primary Care: Merri Ray MD [facility]) Primary Cardiologist: Margaretann Loveless [facility] [place]: Dr. Haroldine Laws   HPI: Adam Reid is a 74 y.o. male from [place] with a h/o a tobacco abuse (quit 2021), HTN, DM2, CKD 4 (Cr ~6.9), diastolic HF, PAF and iron-deficiency anemia, recently referred by Dr. 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 Had initial consultation w/ Dr. Haroldine Laws on 10/15/19.   Had [facility] 01/2016 for CP evaluation revealing mild nonobstructive CAD, LAD 20%, D2 50%, OM 20%  Had COVID in 2019. Not too many symptoms except for fatigue.   Hospitalized 6/29-52/84 for diastolic HF. Diuresed and weight down to 129 pounds. Sent home on lasix 60 daily. Echo LVEF 55-60%, RV ok. Restrictive filling pattern.   Since his hospitalization, he has had issues w/ ongoing fluid overload and was changed from lasix to torsemide. At his initial consult visit on 4/12, he was fluid overloaded and torsemide was increased further to 40 qam/20 qpm.   There was also concern about cardiac amyloidosis given restrictive filling pattern on echo.  Multiple myeloma panel obtained and immunofixation did not demonstrate monoclonal protein spike. PYP completed and negative for TTR amyloid.   He presents back to clinic for f/u. Here with his wife who serves as a Optometrist. He report several medication changes. 1) he self reduced torsemide down from 40/20>>20 mg bid given concerns that he was losing too much wt. 2) Toprol XL was reduced at recent nephrology visit last week due to low BP. He is now on 12.5 mg daily. 3) he has started IV Fe for anemia. He is still awaiting GI w/u for anemia. He was originally scheduled for EGD/colonoscopy on 3/18, but procedure was canceled given admission for a/c CHF at that time.   Today he returns for HF follow up. Last visit he was in A fib RVR and was started on amio and eliquis. Overall feeling fine. Mild SOB with exertion.  Denies PND/Orthopnea. Appetite ok. No fever or chills. Weight at home has been stable. No bleeding issues.  Taking all medications       Past Medical History:  Diagnosis Date  . Anemia   . CHF (congestive heart failure) ([place])   . Chronic kidney disease   . Depression   . Diabetes mellitus without complication ([place])   . Hyperlipidemia   . Hypertension   . Thyroid disease    was on supplement, taken off by [name]    Current Outpatient Medications  Medication Sig Dispense Refill  . albuterol (VENTOLIN HFA) 108 (90 Base) MCG/ACT inhaler Inhale 2 puffs into the lungs every 6 (six) hours as needed for wheezing or shortness of breath. 18 g 0  . amiodarone (PACERONE) 200 MG tablet Take 1 tablet (200 mg total) by mouth 2 (two) times daily. 60 tablet 3  . apixaban (ELIQUIS) 2.5 MG TABS tablet Take 1 tablet (2.5 mg total) by mouth 2 (two) times daily. 60 tablet 6  . aspirin EC 81 MG EC tablet Take 1 tablet (81 mg total) by mouth daily. 30 tablet 5  . atorvastatin (LIPITOR) 80 MG tablet TAKE 1 TABLET(80 MG) BY MOUTH DAILY 90 tablet 1  . blood glucose meter kit and supplies Use up to two times daily as directed  ICD10 E10.9 E11.9 1 each 0  . Blood Glucose Monitoring Suppl (ONETOUCH VERIO) w/Device KIT as directed.   0  . citalopram (CELEXA) 20 MG tablet TAKE 1 TABLET(20 MG) BY MOUTH  DAILY 90 tablet 1  . cyanocobalamin 500 MCG tablet Take 500 mcg by mouth daily. Reported on 12/02/2015    . diltiazem (CARDIZEM CD) 180 MG 24 hr capsule Take 1 capsule (180 mg total) by mouth daily. 30 capsule 2  . glipiZIDE (GLUCOTROL XL) 10 MG 24 hr tablet TAKE 1 TABLET(10 MG) BY MOUTH DAILY WITH BREAKFAST (Patient taking differently: Take 12.5 mg by mouth daily with breakfast. ) 90 tablet 1  . GlucoCom Lancets MISC Use for home glucose monitoring 100 each 3  . glucose blood (ONETOUCH VERIO) test strip Test up to 2 times per day.  Uncontrolled diabetes with hyperglycemia and stage 4 CKD. 100 each 3  . JANUVIA 25  MG tablet TAKE 1 TABLET(25 MG) BY MOUTH DAILY 90 tablet 0  . nitroGLYCERIN (NITROSTAT) 0.4 MG SL tablet Place 1 tablet (0.4 mg total) under the tongue every 5 (five) minutes x 3 doses as needed for chest pain. 25 tablet 2  . Omega 3-6-9 Fatty Acids (OMEGA 3-6-9 COMPLEX PO) Take 1 tablet by mouth daily.    . pioglitazone (ACTOS) 30 MG tablet TAKE 1 TABLET(30 MG) BY MOUTH DAILY 90 tablet 1  . polyethylene glycol (MIRALAX / GLYCOLAX) 17 g packet Take 17 g by mouth daily as needed for mild constipation or moderate constipation.     . senna (SENOKOT) 8.6 MG tablet Take 1 tablet by mouth as needed for constipation.     . tamsulosin (FLOMAX) 0.4 MG CAPS capsule TAKE 2 CAPSULES(0.8 MG) BY MOUTH DAILY 180 capsule 1  . torsemide (DEMADEX) 20 MG tablet Take 2 tablets (40 mg total) by mouth in the morning AND 1 tablet (20 mg total) every evening. 90 tablet 2   No current facility-administered medications for this encounter.    No Known Allergies    Social History   Socioeconomic History  . Marital status: Married    Spouse name: [name]  . Number of children: 1  . Years of education: 12th grade  . Highest education level: Not on file  Occupational History  . Occupation: Retired-accountant  Tobacco Use  . Smoking status: Former Smoker    Packs/day: 0.25    Years: 48.00    Pack years: 12.00    Types: Cigarettes    Quit date: 07/16/2019    Years since quitting: 0.3  . Smokeless tobacco: Never Used  . Tobacco comment: 09/07/16 3-4 cigs daily, trying to cut back  Substance and Sexual Activity  . Alcohol use: No    Alcohol/week: 0.0 standard drinks  . Drug use: No  . Sexual activity: Never    Partners: Female  Other Topics Concern  . Not on file  Social History Narrative   Originally from [place]. Came to the [place] in 2009, following their son who came here for school.   Married.   Lives with his wife.   Their adult son lives in [place], [place], where he is in optometry school.   Education: [employer]   Exercise: No   Social Determinants of Radio broadcast assistant Strain:   . Difficulty of Paying Living Expenses:   Food Insecurity:   . Worried About Charity fundraiser in the Last Year:   . Arboriculturist in the Last Year:   Transportation Needs:   . Film/video editor (Medical):   [name] Lack of Transportation (Non-Medical):   Physical Activity:   . Days of Exercise per Week:   . Minutes of Exercise per  Session:   Stress:   . Feeling of Stress :   Social Connections:   . Frequency of Communication with Friends and Family:   . Frequency of Social Gatherings with Friends and Family:   . Attends Religious Services:   . Active Member of Clubs or Organizations:   . Attends Archivist Meetings:   [name] Marital Status:   Intimate Partner Violence:   . Fear of Current or Ex-Partner:   . Emotionally Abused:   [name] Physically Abused:   . Sexually Abused:       Family History  Problem Relation Age of Onset  . Hypertension Mother   . Hyperlipidemia Mother   . Hypertension Sister   . Kidney disease Father   . Heart disease Brother 90       open heart surgery  . Colon cancer Neg Hx     Vitals:   11/27/19 1420  BP: 120/72  Pulse: 69  SpO2: 97%  Weight: 62.1 kg (137 lb)   Wt Readings from Last 3 Encounters:  11/27/19 62.1 kg (137 lb)  11/20/19 59.9 kg (132 lb)  11/13/19 60 kg (132 lb 3.2 oz)    General:  Well appearing. No resp difficulty HEENT: normal Neck: supple. no JVD. Carotids 2+ bilat; no bruits. No lymphadenopathy or thryomegaly appreciated. Cor: PMI nondisplaced. Regular rate & rhythm. No rubs, gallops or murmurs. Lungs: clear Abdomen: soft, nontender, nondistended. No hepatosplenomegaly. No bruits or masses. Good bowel sounds. Extremities: no cyanosis, clubbing, rash, edema Neuro: alert & orientedx3, cranial nerves grossly intact. moves all 4 extremities w/o difficulty. Affect pleasant  EKG: NSR 70 bpm     ASSESSMENT &  PLAN:  1.Chronic diastolic HF - Echo 3/84: EF 55-60% with restrictive filling pattern - PYP scan 4/21 negative for TTR amyloid  - based on history, ECG, echo findings (normal RV) and concomitant renal failure, suspect this is due to longstanding HTN +/- DM2 changes  - NYHA II. Volume status stable Continue torsemide 20 mg twice a day. Back in NSR so volume status much better today.  - Recommend stopping Actos. Will consult PCP  - Ideally would love to add SGLT2i but with GFR ~25 will need to d/w Renal   2. 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 Cut back amio to 200 mg daily.  -  Continue  Eliquis 2.5 mg bid (denies melena/ hematochezia)  - Zio patch results pending.   3. HTN Stable.   4. CKD Stage IV  - due to HTN and DM2 - Followed by Dr. Elmarie Shiley   5. DM2 - per PCP - he is on Actos, which could contribute to volume overload/ worsening CHF. Will forward note to PCP. Strongly recommend that this be discontinued due to CHF - consider SGLT2i as above if GFR permits.  6. IDA - Nephrology following and treating w/ IV Fe infussions - GI also following. Needs EGD/ colonoscopy. Will consult w/ Dr. Silverio Decamp regarding scope. This needs to be done ASAP. - starting low dose Eliquis, 2.5 mg bid, as outlined above for atrial flutter. - No bleeding issues.    7. Non-obstructive CAD - continue statin   Follow up in 3 months with Dr Aundra Dubin.   Darrick Grinder, NP  2:25 PM

## 2019-11-27 ENCOUNTER — Ambulatory Visit (HOSPITAL_COMMUNITY)
Admission: RE | Admit: 2019-11-27 | Discharge: 2019-11-27 | Disposition: A | Discharge status: Home / Self Care | Payer: Medicare Other | Source: Ambulatory Visit | Attending: Adult Health | Admitting: Adult Health

## 2019-11-27 ENCOUNTER — Other Ambulatory Visit: Payer: Self-pay

## 2019-11-27 ENCOUNTER — Encounter (HOSPITAL_COMMUNITY): Payer: Self-pay

## 2019-11-27 ENCOUNTER — Encounter (HOSPITAL_COMMUNITY): Payer: Medicare Other

## 2019-11-27 VITALS — BP 120/72 | HR 69 | Wt 137.0 lb

## 2019-11-27 DIAGNOSIS — Z8249 Family history of ischemic heart disease and other diseases of the circulatory system: Secondary | ICD-10-CM | POA: Diagnosis not present

## 2019-11-27 DIAGNOSIS — Z7982 Long term (current) use of aspirin: Secondary | ICD-10-CM | POA: Diagnosis not present

## 2019-11-27 DIAGNOSIS — I130 Hypertensive heart and chronic kidney disease with heart failure and stage 1 through stage 4 chronic kidney disease, or unspecified chronic kidney disease: Secondary | ICD-10-CM | POA: Diagnosis not present

## 2019-11-27 DIAGNOSIS — E079 Disorder of thyroid, unspecified: Secondary | ICD-10-CM | POA: Diagnosis not present

## 2019-11-27 DIAGNOSIS — Z7901 Long term (current) use of anticoagulants: Secondary | ICD-10-CM | POA: Insufficient documentation

## 2019-11-27 DIAGNOSIS — I2510 Atherosclerotic heart disease of native coronary artery without angina pectoris: Secondary | ICD-10-CM | POA: Insufficient documentation

## 2019-11-27 DIAGNOSIS — I5032 Chronic diastolic (congestive) heart failure: Secondary | ICD-10-CM | POA: Diagnosis not present

## 2019-11-27 DIAGNOSIS — Z7984 Long term (current) use of oral hypoglycemic drugs: Secondary | ICD-10-CM | POA: Insufficient documentation

## 2019-11-27 DIAGNOSIS — Z79899 Other long term (current) drug therapy: Secondary | ICD-10-CM | POA: Diagnosis not present

## 2019-11-27 DIAGNOSIS — D649 Anemia, unspecified: Secondary | ICD-10-CM | POA: Insufficient documentation

## 2019-11-27 DIAGNOSIS — R0609 Other forms of dyspnea: Secondary | ICD-10-CM

## 2019-11-27 DIAGNOSIS — N184 Chronic kidney disease, stage 4 (severe): Secondary | ICD-10-CM | POA: Insufficient documentation

## 2019-11-27 DIAGNOSIS — E119 Type 2 diabetes mellitus without complications: Secondary | ICD-10-CM | POA: Insufficient documentation

## 2019-11-27 DIAGNOSIS — R0602 Shortness of breath: Secondary | ICD-10-CM | POA: Diagnosis present

## 2019-11-27 DIAGNOSIS — F329 Major depressive disorder, single episode, unspecified: Secondary | ICD-10-CM | POA: Diagnosis not present

## 2019-11-27 DIAGNOSIS — I4892 Unspecified atrial flutter: Secondary | ICD-10-CM | POA: Diagnosis not present

## 2019-11-27 DIAGNOSIS — R0600 Dyspnea, unspecified: Secondary | ICD-10-CM | POA: Diagnosis not present

## 2019-11-27 DIAGNOSIS — E785 Hyperlipidemia, unspecified: Secondary | ICD-10-CM | POA: Insufficient documentation

## 2019-11-27 DIAGNOSIS — Z87891 Personal history of nicotine dependence: Secondary | ICD-10-CM | POA: Insufficient documentation

## 2019-11-27 MED ORDER — AMIODARONE HCL 200 MG PO TABS: 200.0000 mg | ORAL_TABLET | Freq: Every day | ORAL | 6 refills | Status: DC

## 2019-11-27 NOTE — Patient Instructions (Signed)
DECREASE Amiodarone to 200mg  (1 tab) daily    Your physician recommends that you schedule a follow-up appointment in: 3 months with Dr Aundra Dubin  NEXT APPOINTMENT Monday August 23rd, 2021 at 11:40am Garage code [id number]   Please call office at [phone] option 2 if you have any questions or concerns.   At the [facility], you and your health needs are our priority. As part of our continuing mission to provide you with exceptional heart care, we have created designated Provider Care Teams. These Care Teams include your primary Cardiologist (physician) and Advanced Practice Providers (APPs- Physician Assistants and Nurse Practitioners) who all work together to provide you with the care you need, when you need it.   You may see any of the following providers on your designated Care Team at your next follow up: [name] Dr Glori Bickers . Dr Loralie Champagne . Darrick Grinder, NP . Lyda Jester, PA . Audry Riles, PharmD   Please be sure to bring in all your medications bottles to every appointment.

## 2019-11-29 ENCOUNTER — Telehealth: Payer: Self-pay | Admitting: Family Medicine

## 2019-11-30 ENCOUNTER — Ambulatory Visit (INDEPENDENT_AMBULATORY_CARE_PROVIDER_SITE_OTHER): Payer: Medicare Other | Admitting: Family Medicine

## 2019-11-30 ENCOUNTER — Other Ambulatory Visit: Payer: Self-pay

## 2019-11-30 VITALS — BP 105/62 | HR 71 | Temp 98.6°F | Resp 14 | Ht 66.0 in | Wt 135.2 lb

## 2019-11-30 DIAGNOSIS — E11649 Type 2 diabetes mellitus with hypoglycemia without coma: Secondary | ICD-10-CM

## 2019-11-30 DIAGNOSIS — D509 Iron deficiency anemia, unspecified: Secondary | ICD-10-CM

## 2019-11-30 DIAGNOSIS — I2510 Atherosclerotic heart disease of native coronary artery without angina pectoris: Secondary | ICD-10-CM | POA: Diagnosis not present

## 2019-11-30 DIAGNOSIS — E1122 Type 2 diabetes mellitus with diabetic chronic kidney disease: Secondary | ICD-10-CM | POA: Diagnosis not present

## 2019-11-30 DIAGNOSIS — Z79899 Other long term (current) drug therapy: Secondary | ICD-10-CM | POA: Diagnosis not present

## 2019-11-30 DIAGNOSIS — Z5181 Encounter for therapeutic drug level monitoring: Secondary | ICD-10-CM

## 2019-11-30 DIAGNOSIS — I5031 Acute diastolic (congestive) heart failure: Secondary | ICD-10-CM

## 2019-11-30 DIAGNOSIS — S80812A Abrasion, left lower leg, initial encounter: Secondary | ICD-10-CM

## 2019-11-30 DIAGNOSIS — S80811A Abrasion, right lower leg, initial encounter: Secondary | ICD-10-CM

## 2019-11-30 LAB — GLUCOSE, POCT (MANUAL RESULT ENTRY): POC Glucose: 44 mg/dl — AB (ref 70–99)

## 2019-11-30 NOTE — Patient Instructions (Addendum)
Stop glipizide 2.'5mg'$ . only '10mg'$  for now.  Stop pioglitazone (can make heart failure/fluid worse).  Continue januvia for now.  Keep a record of your blood sugars 3 times per day and recheck in 1 week with those readings.  There is another type of diabetes med that could be good with your heart failure, but would need to discuss with kidney doctor. No new meds for now.  Do not skip meals. Make sure to carry glucose or some form of sugar with you in case sugar goes low again.  Return to the clinic or go to the nearest emergency room if any of your symptoms worsen or new symptoms occur.   Keep wound on leg clean with soap and water at least once per day, cover with clean bandage. If any redness, pus or other signs of infection be seen right away. Keep legs elevated to help with swelling.  I will check thyroid test with amiodarone use. Continue follow up with nephrologist and cardiology.    Abrasion  An abrasion is a cut or a scrape on the surface of your skin. An abrasion does not go through all the layers of your skin. It is important to take good care of your abrasion to prevent infection. Follow these instructions at home: Medicines  Take or apply over-the-counter and prescription medicines only as told by your doctor.  If you were prescribed an antibiotic medicine, apply it as told by your doctor. Do not stop using the antibiotic even if you start to feel better. Wound care  Clean the wound 2-3 times a day or as often as told by your doctor. To do this: 1. Wash the wound with mild soap and water. 2. Rinse off the soap. 3. Pat a clean towel on the wound to dry it. Do not rub it.  Keep the bandage (dressing) clean and dry as told by your doctor.  Follow instructions from your doctor about how to take care of your wound. Make sure you: ? Wash your hands with soap and water before you change your bandage. If you cannot use soap and water, use hand sanitizer. ? Change your bandage as  told by your doctor.  Check your wound every day for signs of infection. Check for: ? Redness, swelling, or pain. ? Fluid or blood. ? Warmth. ? Pus or a bad smell.  If directed, put ice on the injured area. To do this: ? Put ice in a plastic bag. ? Place a towel between your skin and the bag. ? Leave the ice on for 20 minutes, 2-3 times a day. General instructions  Do not take baths, swim, or use a hot tub until your doctor says it is okay.  If there is swelling, raise (elevate) the injured area above the level of your heart while you are sitting or lying down.  Keep all follow-up visits as told by your doctor. This is important. Contact a doctor if:  You were given a tetanus shot, and you have any of these where the needle went in: ? Swelling. ? Very bad pain. ? Redness. ? Bleeding.  You have a lot of pain, and medicine does not help.  You have any of these at the site of the wound: ? More redness. ? More swelling. ? More pain. Get help right away if:  You have a red streak going away from your wound.  You have a fever.  You have fluid, blood, or pus coming from your wound.  There is a bad smell coming from your wound or bandage. Summary  An abrasion is a cut or a scrape on the surface of your skin.  Take good care of your abrasion so it does not get infected.  Clean the wound with mild soap and water, and change your bandage as told by your doctor.  Call your doctor if you have redness, swelling, or more pain in your wound.  Get help right away if you have a fever or if you have fluid, blood, pus, a bad smell, or a red streak coming from the wound. This information is not intended to replace advice given to you by your health care provider. Make sure you discuss any questions you have with your health care provider. Document Revised: 06/10/2017 Document Reviewed: 02/17/2017 Elsevier Patient Education  [place].   Hypoglycemia Hypoglycemia occurs  when the level of sugar (glucose) in the blood is too low. Hypoglycemia can happen in people who do or do not have diabetes. It can develop quickly, and it can be a medical emergency. For most people with diabetes, a blood glucose level below 70 mg/dL (3.9 mmol/L) is considered hypoglycemia. Glucose is a type of sugar that provides the body's main source of energy. Certain hormones (insulin and glucagon) control the level of glucose in the blood. Insulin lowers blood glucose, and glucagon raises blood glucose. Hypoglycemia can result from having too much insulin in the bloodstream, or from not eating enough food that contains glucose. You may also have reactive hypoglycemia, which happens within 4 hours after eating a meal. What are the causes? Hypoglycemia occurs most often in people who have diabetes and may be caused by:  Diabetes medicine.  Not eating enough, or not eating often enough.  Increased physical activity.  Drinking alcohol on an empty stomach. If you do not have diabetes, hypoglycemia may be caused by:  A tumor in the pancreas.  Not eating enough, or not eating for long periods at a time (fasting).  A severe infection or illness.  Certain medicines. What increases the risk? Hypoglycemia is more likely to develop in:  People who have diabetes and take medicines to lower blood glucose.  People who abuse alcohol.  People who have a severe illness. What are the signs or symptoms? Mild symptoms Mild hypoglycemia may not cause any symptoms. If you do have symptoms, they may include:  Hunger.  Anxiety.  Sweating and feeling clammy.  Dizziness or feeling light-headed.  Sleepiness.  Nausea.  Increased heart rate.  Headache.  Blurry vision.  Irritability.  Tingling or numbness around the mouth, lips, or tongue.  A change in coordination.  Restless sleep. Moderate symptoms Moderate hypoglycemia can cause:  Mental confusion and poor  judgment.  Behavior changes.  Weakness.  Irregular heartbeat. Severe symptoms Severe hypoglycemia is a medical emergency. It can cause:  Fainting.  Seizures.  Loss of consciousness (coma).  Death. How is this diagnosed? Hypoglycemia is diagnosed with a blood test to measure your blood glucose level. This blood test is done while you are having symptoms. Your health care provider may also do a physical exam and review your medical history. How is this treated? This condition can often be treated by immediately eating or drinking something that contains sugar, such as:  Fruit juice, 4-6 oz (120-150 mL).  Regular soda (not diet soda), 4-6 oz (120-150 mL).  Low-fat milk, 4 oz (120 mL).  Several pieces of hard candy.  Sugar or honey, 1 Tbsp (  15 mL). Treating hypoglycemia if you have diabetes If you are alert and able to swallow safely, follow the 15:15 rule:  Take 15 grams of a rapid-acting carbohydrate. Talk with your health care provider about how much you should take.  Rapid-acting options include: ? Glucose pills (take 15 grams). ? 6-8 pieces of hard candy. ? 4-6 oz (120-150 mL) of fruit juice. ? 4-6 oz (120-150 mL) of regular (not diet) soda. ? 1 Tbsp (15 mL) honey or sugar.  Check your blood glucose 15 minutes after you take the carbohydrate.  If the repeat blood glucose level is still at or below 70 mg/dL (3.9 mmol/L), take 15 grams of a carbohydrate again.  If your blood glucose level does not increase above 70 mg/dL (3.9 mmol/L) after 3 tries, seek emergency medical care.  After your blood glucose level returns to normal, eat a meal or a snack within 1 hour.  Treating severe hypoglycemia Severe hypoglycemia is when your blood glucose level is at or below 54 mg/dL (3 mmol/L). Severe hypoglycemia is a medical emergency. Get medical help right away. If you have severe hypoglycemia and you cannot eat or drink, you may need an injection of glucagon. A family member  or close friend should learn how to check your blood glucose and how to give you a glucagon injection. Ask your health care provider if you need to have an emergency glucagon injection kit available. Severe hypoglycemia may need to be treated in a hospital. The treatment may include getting glucose through an IV. You may also need treatment for the cause of your hypoglycemia. Follow these instructions at home:  General instructions  Take over-the-counter and prescription medicines only as told by your health care provider.  Monitor your blood glucose as told by your health care provider.  Limit alcohol intake to no more than 1 drink a day for nonpregnant women and 2 drinks a day for men. One drink equals 12 oz of beer (355 mL), 5 oz of wine (148 mL), or 1 oz of hard liquor (44 mL).  Keep all follow-up visits as told by your health care provider. This is important. If you have diabetes:  Always have a rapid-acting carbohydrate snack with you to treat low blood glucose.  Follow your diabetes management plan as directed. Make sure you: ? Know the symptoms of hypoglycemia. It is important to treat it right away to prevent it from becoming severe. ? Take your medicines as directed. ? Follow your exercise plan. ? Follow your meal plan. Eat on time, and do not skip meals. ? Check your blood glucose as often as directed. Always check before and after exercise. ? Follow your sick day plan whenever you cannot eat or drink normally. Make this plan in advance with your health care provider.  Share your diabetes management plan with people in your workplace, school, and household.  Check your urine for ketones when you are ill and as told by your health care provider.  Carry a medical alert card or wear medical alert jewelry. Contact a health care provider if:  You have problems keeping your blood glucose in your target range.  You have frequent episodes of hypoglycemia. Get help right away  if:  You continue to have hypoglycemia symptoms after eating or drinking something containing glucose.  Your blood glucose is at or below 54 mg/dL (3 mmol/L).  You have a seizure.  You faint. These symptoms may represent a serious problem that is an emergency. Do  not wait to see if the symptoms will go away. Get medical help right away. Call your local emergency services (911 in the [place] Summary  Hypoglycemia occurs when the level of sugar (glucose) in the blood is too low.  Hypoglycemia can happen in people who do or do not have diabetes. It can develop quickly, and it can be a medical emergency.  Make sure you know the symptoms of hypoglycemia and how to treat it.  Always have a rapid-acting carbohydrate snack with you to treat low blood sugar. This information is not intended to replace advice given to you by your health care provider. Make sure you discuss any questions you have with your health care provider. Document Revised: 12/19/2017 Document Reviewed: 08/01/2015 Elsevier Patient Education  [employer].   If you have lab work done today you will be contacted with your lab results within the next 2 weeks.  If you have not heard from [place] then please contact us. The fastest way to get your results is to register for My Chart.   IF you received an x-ray today, you will receive an invoice from [facility]. Please contact [facility] at [phone] with questions or concerns regarding your invoice.   IF you received labwork today, you will receive an invoice from [place]. Please contact [facility] at [phone] with questions or concerns regarding your invoice.   Our billing staff will not be able to assist you with questions regarding bills from these companies.  You will be contacted with the lab results as soon as they are available. The fastest way to get your results is to activate your My Chart account. Instructions are located on the last page of  this paperwork. If you have not heard from [place] regarding the results in 2 weeks, please contact this office.

## 2019-11-30 NOTE — Progress Notes (Signed)
Subjective:  Patient ID: Adam Reid, male    DOB: 1945/10/01  Age: 74 y.o. MRN: [id number]  CC:  Chief Complaint  Patient presents with  . Diabetes    pt checks sugars at home 180 this am, has been working on diet, cough is slightly better would like a chest x ray for fear of fluid build up, last A1c 7.3 Jan. 2021, pts nephrologist is concerned one of his meds may effect his thyroid and would like [place] to draw a TSH    HPI Adam Reid presents for  Diabetes: Complicated by chronic kidney disease stage 4, history of CHF, microalbuminuria.  Saw nephrology few days ago - toprol stopped. GFR improved to 29% by report.  Previously treated with Januvia 25 mg daily, Actos 30 mg daily, glipizide 10 mg and half of 5 mg with breakfast for total 12.5 mg total per day when discussed March 5.  Fructosamine elevated at 314 at that time.  Current meds: Glipizide xl - 24m and 2.584min am. actos 3063md januvia 63m67m.  Home readings: 180 this morning.  No readings over 250.  Results for orders placed or performed in visit on 11/30/19  TSH  Result Value Ref Range   TSH 4.740 (H) 0.450 - 4.500 uIU/mL  Hemoglobin A1c  Result Value Ref Range   Hgb A1c MFr Bld 6.9 (H) 4.8 - 5.6 %   Est. average glucose Bld gHb Est-mCnc 151 mg/dL  Specimen status report  Result Value Ref Range   specimen status report Comment   POCT glucose (manual entry)  Result Value Ref Range   POC Glucose 44 (A) 70 - 99 mg/dl  denies acute lightheadedness, sweating, feels ok.  Yesterday was sweating - glucose 47 in afternoon. Late breakfast.  Usually breakfast,  snack in day, dinner, then another snack.    Microalbumin: elevated ratio 698 in 04/2019 Optho, foot exam, pneumovax:   Lab Results  Component Value Date   HGBA1C 7.3 (H) 03/07/2020   HGBA1C 6.9 (H) 11/30/2019   HGBA1C 7.3 (A) 07/20/2019   Lab Results  Component Value Date   MICROALBUR 60.3 09/02/2015   LDLCALC 57 07/20/2019   CREATININE 3.40  (H) 03/28/2020    CHF, atrial flutter Hospitalized March 14-107-68 diastolic heart failure.  Discharged on Lasix 60 mg daily.  EF 55 to 60%.  Continued fluid overload issues since hospitalization and was changed to torsemide, ultimately at 40 mg in the morning, 20 mg in evening.  Concern for cardiac amyloidosis given restrictive filling pattern on echo, multiple myeloma panel without monoclonal protein spike, negative for ATTR amyloid. Started on amiodarone and Eliquis for A. fib with RVR April 26 CHF clinic visit.  Started on amiodarone 200 mg twice daily, Eliquis 2.5 mg twice daily  Note reviewed from advanced heart failure clinic May 18.  Primary cardiologist Dr. ArchAlveda Reasonseart failure, Dr. BensHaroldine Laws self decreased torsemide to 20 mg twice daily.  Toprol-XL had been recently reduced by nephrology due to low blood pressures, now at 12.5 mg daily.  - now off toprol d/t low BP.  Amiodarone decreased to 200mg93mas chemically converted to sinus rhythm. Continued on Eliquis 2.5 mg twice daily, cautioned on any melena hematochezia with anemia. Concerns regarding continued Actos noted with his CHF/volume overload., consideration of SGLT2 if cleared by nephrology.  Some cough few days ago, some chronic cough- no new cough, no fever. No cough today. No chest pains.   Left leg abrasion: Small  blister this am - scraped open in shower.  Tx: none.   Anemia  On Iron infusion for anemia,  Plan for GI work-up for anemia, initial plan for EGD colonoscopy on March 18 but canceled due to acute CHF at that time. Had EGD,, colonoscopy 5/11. Injection next Friday for anemia.    1. Surgical [P], duodenal bulb, 2nd portion of duodenum an distal duodenum - PEPTIC DUODENITIS - NO DYSPLASIA OR MALIGNANCY IDENTIFIED 2. 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 PYLORI OR INTESTINAL METAPLASIA IDENTIFIED - SEE COMMENT 3. Surgical [P], distal esophagus -  SQUAMOUS AND GLANDULAR EPITHELIUM WITH ACUTE AND CHRONIC INFLAMMATION - INTESTINAL METAPLASIA PRESENT - NO DYSPLASIA OR MALIGNANCY IDENTIFIED - SEE COMMENT 4. Surgical [P], colon, ascending, distal transverse, sigmoid, polyp (3) - TUBULAR ADENOMA (3 OF 5 FRAGMENTS) - BENIGN COLONIC MUCOSA (2 OF 5 FRAGMENTS) - NO HIGH GRADE DYSPLASIA OR MALIGNANCY IDENTIFIED 5. Surgical [P], colon, rectum, polyp - TUBULAR ADENOMA (1 OF 1 FRAGMENTS) - NO HIGH GRADE DYSPLASIA OR MALIGNANCY IDENTIFIED  Lab Results  Component Value Date   TSH 4.686 (H) 03/07/2020     History Patient Active Problem List   Diagnosis Date Noted  . Acute CHF (congestive heart failure) ([place]) 09/24/2019  . ARF (acute renal failure) ([place]) 09/24/2019  . Anemia 09/24/2019  . Acute respiratory failure with hypoxia ([place])   . Secondary hyperparathyroidism, renal ([place]) 12/29/2016  . Smoker 03/26/2016  . Hypotension 01/29/2016  . Abnormal myocardial perfusion study 01/29/2016  . Depression 12/09/2015  . Hyperlipidemia LDL goal <100 06/28/2014  . Diabetes mellitus with renal manifestations, controlled ([place])   . HTN (hypertension) 06/24/2014  . 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 BPH (benign prostatic hyperplasia) 06/24/2014   Past Medical History:  Diagnosis Date  . Anemia   . CHF (congestive heart failure) ([place])   . Chronic kidney disease   . Depression   . Diabetes mellitus without complication ([place])   . Hyperlipidemia   . Hypertension   . Thyroid disease    was on supplement, taken off by [name]   Past Surgical History:  Procedure Laterality Date  . APPENDECTOMY    . CARDIAC CATHETERIZATION N/A 01/30/2016   Procedure: Left Heart Cath and Coronary Angiography;  Surgeon: [name], MD;  Location: [facility];  Service: Cardiovascular;  Laterality: N/A;   No Known Allergies Prior to Admission medications   Medication Sig Start Date End Date  Taking? Authorizing Provider  albuterol (VENTOLIN HFA) 108 (90 Base) MCG/ACT inhaler Inhale 2 puffs into the lungs every 6 (six) hours as needed for wheezing or shortness of breath. 07/02/19  Yes Maximiano Coss, NP  amiodarone (PACERONE) 200 MG tablet Take 1 tablet (200 mg total) by mouth daily. 11/27/19  Yes Clegg, Amy D, NP  apixaban (ELIQUIS) 2.5 MG TABS tablet Take 1 tablet (2.5 mg total) by mouth 2 (two) times daily. 11/05/19  Yes Lyda Jester M, PA-C  aspirin EC 81 MG EC tablet Take 1 tablet (81 mg total) by mouth daily. 12/03/15  Yes Mayo, Pete Pelt, MD  atorvastatin (LIPITOR) 80 MG tablet TAKE 1 TABLET(80 MG) BY MOUTH DAILY 07/20/19  Yes [name], MD  blood glucose meter kit and supplies Use up to two times daily as directed  ICD10 E10.9 E11.9 02/20/19  Yes [name], MD  Blood Glucose Monitoring Suppl [facility] VERIO) w/Device  KIT as directed.  09/03/15  Yes [provider]  citalopram (CELEXA) 20 MG tablet TAKE 1 TABLET(20 MG) BY MOUTH DAILY 07/20/19  Yes [name], MD  cyanocobalamin 500 MCG tablet Take 500 mcg by mouth daily. Reported on 12/02/2015   Yes [provider]  diltiazem (CARDIZEM CD) 180 MG 24 hr capsule Take 1 capsule (180 mg total) by mouth daily. 09/27/19  Yes Pokhrel, Laxman, MD  glipiZIDE (GLUCOTROL XL) 10 MG 24 hr tablet TAKE 1 TABLET(10 MG) BY MOUTH DAILY WITH BREAKFAST Patient taking differently: Take 12.5 mg by mouth daily with breakfast.  07/30/19  Yes [name], MD  GlucoCom Lancets MISC Use for home glucose monitoring 09/02/15  Yes Jeffery, Chelle, PA  glucose blood (ONETOUCH VERIO) test strip Test up to 2 times per day.  Uncontrolled diabetes with hyperglycemia and stage 4 CKD. 04/13/19  Yes [name], MD  JANUVIA 25 MG tablet TAKE 1 TABLET(25 MG) BY MOUTH DAILY 10/10/19  Yes [name], MD  nitroGLYCERIN (NITROSTAT) 0.4 MG SL tablet Place 1 tablet (0.4 mg total) under the tongue every 5 (five) minutes x 3  doses as needed for chest pain. 01/31/16  Yes Arbutus Leas, NP  Omega 3-6-9 Fatty Acids (OMEGA 3-6-9 COMPLEX PO) Take 1 tablet by mouth daily.   Yes [provider]  pioglitazone (ACTOS) 30 MG tablet TAKE 1 TABLET(30 MG) BY MOUTH DAILY 07/20/19  Yes [name], MD  polyethylene glycol (MIRALAX / GLYCOLAX) 17 g packet Take 17 g by mouth daily as needed for mild constipation or moderate constipation.    Yes [provider]  senna (SENOKOT) 8.6 MG tablet Take 1 tablet by mouth as needed for constipation.    Yes [provider]  tamsulosin (FLOMAX) 0.4 MG CAPS capsule TAKE 2 CAPSULES(0.8 MG) BY MOUTH DAILY 07/20/19  Yes [name], MD  torsemide (DEMADEX) 20 MG tablet Take 2 tablets (40 mg total) by mouth in the morning AND 1 tablet (20 mg total) every evening. 11/05/19  Yes Consuelo Pandy, PA-C   Social History   Socioeconomic History  . Marital status: Married    Spouse name: [name]  . Number of children: 1  . Years of education: 12th grade  . Highest education level: Not on file  Occupational History  . Occupation: Retired-accountant  Tobacco Use  . Smoking status: Light Tobacco Smoker    Packs/day: 0.10    Years: 48.00    Pack years: 4.80    Types: Cigarettes    Last attempt to quit: 07/16/2019    Years since quitting: 0.7  . Smokeless tobacco: Never Used  . Tobacco comment: per pt he has one cigarette every couple days  Vaping Use  . Vaping Use: Never used  Substance and Sexual Activity  . Alcohol use: No    Alcohol/week: 0.0 standard drinks  . Drug use: No  . Sexual activity: Never    Partners: Female  Other Topics Concern  . Not on file  Social History Narrative   Originally from [place]. Came to the [place] in 2009, following their son who came here for school.   Married.   Lives with his wife.   Their adult son lives in [place], [place], where he is in optometry school.   Education: [employer]   Exercise: No   Social Determinants of  Radio broadcast assistant Strain:   . Difficulty of Paying Living Expenses: Not on file  Food  Insecurity:   . Worried About Charity fundraiser in the Last Year: Not on file  . Ran Out of Food in the Last Year: Not on file  Transportation Needs:   . Lack of Transportation (Medical): Not on file  . Lack of Transportation (Non-Medical): Not on file  Physical Activity:   . Days of Exercise per Week: Not on file  . Minutes of Exercise per Session: Not on file  Stress:   . Feeling of Stress : Not on file  Social Connections:   . Frequency of Communication with Friends and Family: Not on file  . Frequency of Social Gatherings with Friends and Family: Not on file  . Attends Religious Services: Not on file  . Active Member of Clubs or Organizations: Not on file  . Attends Archivist Meetings: Not on file  . Marital Status: Not on file  Intimate Partner Violence:   . Fear of Current or Ex-Partner: Not on file  . Emotionally Abused: Not on file  . Physically Abused: Not on file  . Sexually Abused: Not on file    Review of Systems Per HPI.   Objective:   Vitals:   11/30/19 1608  BP: 105/62  Pulse: 71  Resp: 14  Temp: 98.6 F (37 C)  TempSrc: Temporal  SpO2: 96%  Weight: 135 lb 3.2 oz (61.3 kg)  Height: _0  (1.676 m)     Physical Exam Vitals reviewed.  Constitutional:      Appearance: He is well-developed.  HENT:     Head: Normocephalic and atraumatic.  Eyes:     Pupils: Pupils are equal, round, and reactive to light.  Neck:     Vascular: No carotid bruit or JVD.  Cardiovascular:     Rate and Rhythm: Normal rate and regular rhythm.     Heart sounds: Normal heart sounds. No murmur.  Pulmonary:     Effort: Pulmonary effort is normal.     Breath sounds: Normal breath sounds. No rales.  Musculoskeletal:     Right lower leg: Edema (pedal. ) present.     Left lower leg: Edema present.  Skin:    General: Skin is warm and dry.  Neurological:     Mental  Status: He is alert and oriented to person, place, and time.       Over 30mn visit with 15 min chart review - greater than 50% counseling.   Assessment & Plan:  Adam Dearingeris a 74y.o. male . Type 2 diabetes mellitus with chronic kidney disease, without long-term current use of insulin, unspecified CKD stage ([place] Diabetic hypoglycemia ([place] - Plan: Hemoglobin A1c, POCT glucose (manual entry) Acute diastolic heart failure ([place]  - with hx of heart failure, will stop pioglitazone, continue jTonga Decrease glipizide to lessen risk hypoglycemia. Avoid skipped meals, hypoglycemia precautions. Consider SGLT2 addition. Home readings and close follow up planned.    Medication monitoring encounter - Plan: TSH Long term current use of amiodarone - Plan: TSH High risk medication use - Plan: TSH  - check tsh with use of amiodaone.  Abrasion of right leg, initial encounter  - wound care discussed with RTC precautions.   Iron deficiency anemia, unspecified iron deficiency anemia type   No orders of the defined types were placed in this encounter.  Patient Instructions     Stop glipizide 2.556m only 1084mor now.  Stop pioglitazone (can make heart failure/fluid worse).  Continue januvia for now.  Keep a record of  your blood sugars 3 times per day and recheck in 1 week with those readings.  There is another type of diabetes med that could be good with your heart failure, but would need to discuss with kidney doctor. No new meds for now.  Do not skip meals. Make sure to carry glucose or some form of sugar with you in case sugar goes low again.  Return to the clinic or go to the nearest emergency room if any of your symptoms worsen or new symptoms occur.   Keep wound on leg clean with soap and water at least once per day, cover with clean bandage. If any redness, pus or other signs of infection be seen right away. Keep legs elevated to help with swelling.  I will check thyroid  test with amiodarone use. Continue follow up with nephrologist and cardiology.    Abrasion  An abrasion is a cut or a scrape on the surface of your skin. An abrasion does not go through all the layers of your skin. It is important to take good care of your abrasion to prevent infection. Follow these instructions at home: Medicines  Take or apply over-the-counter and prescription medicines only as told by your doctor.  If you were prescribed an antibiotic medicine, apply it as told by your doctor. Do not stop using the antibiotic even if you start to feel better. Wound care  Clean the wound 2-3 times a day or as often as told by your doctor. To do this: 1. Wash the wound with mild soap and water. 2. Rinse off the soap. 3. Pat a clean towel on the wound to dry it. Do not rub it.  Keep the bandage (dressing) clean and dry as told by your doctor.  Follow instructions from your doctor about how to take care of your wound. Make sure you: ? Wash your hands with soap and water before you change your bandage. If you cannot use soap and water, use hand sanitizer. ? Change your bandage as told by your doctor.  Check your wound every day for signs of infection. Check for: ? Redness, swelling, or pain. ? Fluid or blood. ? Warmth. ? Pus or a bad smell.  If directed, put ice on the injured area. To do this: ? Put ice in a plastic bag. ? Place a towel between your skin and the bag. ? Leave the ice on for 20 minutes, 2-3 times a day. General instructions  Do not take baths, swim, or use a hot tub until your doctor says it is okay.  If there is swelling, raise (elevate) the injured area above the level of your heart while you are sitting or lying down.  Keep all follow-up visits as told by your doctor. This is important. Contact a doctor if:  You were given a tetanus shot, and you have any of these where the needle went in: ? Swelling. ? Very bad pain. ? Redness. ? Bleeding.  You have  a lot of pain, and medicine does not help.  You have any of these at the site of the wound: ? More redness. ? More swelling. ? More pain. Get help right away if:  You have a red streak going away from your wound.  You have a fever.  You have fluid, blood, or pus coming from your wound.  There is a bad smell coming from your wound or bandage. Summary  An abrasion is a cut or a scrape on the surface of  your skin.  Take good care of your abrasion so it does not get infected.  Clean the wound with mild soap and water, and change your bandage as told by your doctor.  Call your doctor if you have redness, swelling, or more pain in your wound.  Get help right away if you have a fever or if you have fluid, blood, pus, a bad smell, or a red streak coming from the wound. This information is not intended to replace advice given to you by your health care provider. Make sure you discuss any questions you have with your health care provider. Document Revised: 06/10/2017 Document Reviewed: 02/17/2017 Elsevier Patient Education  [place].   Hypoglycemia Hypoglycemia occurs when the level of sugar (glucose) in the blood is too low. Hypoglycemia can happen in people who do or do not have diabetes. It can develop quickly, and it can be a medical emergency. For most people with diabetes, a blood glucose level below 70 mg/dL (3.9 mmol/L) is considered hypoglycemia. Glucose is a type of sugar that provides the body's main source of energy. Certain hormones (insulin and glucagon) control the level of glucose in the blood. Insulin lowers blood glucose, and glucagon raises blood glucose. Hypoglycemia can result from having too much insulin in the bloodstream, or from not eating enough food that contains glucose. You may also have reactive hypoglycemia, which happens within 4 hours after eating a meal. What are the causes? Hypoglycemia occurs most often in people who have diabetes and may be  caused by:  Diabetes medicine.  Not eating enough, or not eating often enough.  Increased physical activity.  Drinking alcohol on an empty stomach. If you do not have diabetes, hypoglycemia may be caused by:  A tumor in the pancreas.  Not eating enough, or not eating for long periods at a time (fasting).  A severe infection or illness.  Certain medicines. What increases the risk? Hypoglycemia is more likely to develop in:  People who have diabetes and take medicines to lower blood glucose.  People who abuse alcohol.  People who have a severe illness. What are the signs or symptoms? Mild symptoms Mild hypoglycemia may not cause any symptoms. If you do have symptoms, they may include:  Hunger.  Anxiety.  Sweating and feeling clammy.  Dizziness or feeling light-headed.  Sleepiness.  Nausea.  Increased heart rate.  Headache.  Blurry vision.  Irritability.  Tingling or numbness around the mouth, lips, or tongue.  A change in coordination.  Restless sleep. Moderate symptoms Moderate hypoglycemia can cause:  Mental confusion and poor judgment.  Behavior changes.  Weakness.  Irregular heartbeat. Severe symptoms Severe hypoglycemia is a medical emergency. It can cause:  Fainting.  Seizures.  Loss of consciousness (coma).  Death. How is this diagnosed? Hypoglycemia is diagnosed with a blood test to measure your blood glucose level. This blood test is done while you are having symptoms. Your health care provider may also do a physical exam and review your medical history. How is this treated? This condition can often be treated by immediately eating or drinking something that contains sugar, such as:  Fruit juice, 4-6 oz (120-150 mL).  Regular soda (not diet soda), 4-6 oz (120-150 mL).  Low-fat milk, 4 oz (120 mL).  Several pieces of hard candy.  Sugar or honey, 1 Tbsp (15 mL). Treating hypoglycemia if you have diabetes If you are alert  and able to swallow safely, follow the 15:15 rule:  Take 15 grams  of a rapid-acting carbohydrate. Talk with your health care provider about how much you should take.  Rapid-acting options include: ? Glucose pills (take 15 grams). ? 6-8 pieces of hard candy. ? 4-6 oz (120-150 mL) of fruit juice. ? 4-6 oz (120-150 mL) of regular (not diet) soda. ? 1 Tbsp (15 mL) honey or sugar.  Check your blood glucose 15 minutes after you take the carbohydrate.  If the repeat blood glucose level is still at or below 70 mg/dL (3.9 mmol/L), take 15 grams of a carbohydrate again.  If your blood glucose level does not increase above 70 mg/dL (3.9 mmol/L) after 3 tries, seek emergency medical care.  After your blood glucose level returns to normal, eat a meal or a snack within 1 hour.  Treating severe hypoglycemia Severe hypoglycemia is when your blood glucose level is at or below 54 mg/dL (3 mmol/L). Severe hypoglycemia is a medical emergency. Get medical help right away. If you have severe hypoglycemia and you cannot eat or drink, you may need an injection of glucagon. A family member or close friend should learn how to check your blood glucose and how to give you a glucagon injection. Ask your health care provider if you need to have an emergency glucagon injection kit available. Severe hypoglycemia may need to be treated in a hospital. The treatment may include getting glucose through an IV. You may also need treatment for the cause of your hypoglycemia. Follow these instructions at home:  General instructions  Take over-the-counter and prescription medicines only as told by your health care provider.  Monitor your blood glucose as told by your health care provider.  Limit alcohol intake to no more than 1 drink a day for nonpregnant women and 2 drinks a day for men. One drink equals 12 oz of beer (355 mL), 5 oz of wine (148 mL), or 1 oz of hard liquor (44 mL).  Keep all follow-up visits as told by  your health care provider. This is important. If you have diabetes:  Always have a rapid-acting carbohydrate snack with you to treat low blood glucose.  Follow your diabetes management plan as directed. Make sure you: ? Know the symptoms of hypoglycemia. It is important to treat it right away to prevent it from becoming severe. ? Take your medicines as directed. ? Follow your exercise plan. ? Follow your meal plan. Eat on time, and do not skip meals. ? Check your blood glucose as often as directed. Always check before and after exercise. ? Follow your sick day plan whenever you cannot eat or drink normally. Make this plan in advance with your health care provider.  Share your diabetes management plan with people in your workplace, school, and household.  Check your urine for ketones when you are ill and as told by your health care provider.  Carry a medical alert card or wear medical alert jewelry. Contact a health care provider if:  You have problems keeping your blood glucose in your target range.  You have frequent episodes of hypoglycemia. Get help right away if:  You continue to have hypoglycemia symptoms after eating or drinking something containing glucose.  Your blood glucose is at or below 54 mg/dL (3 mmol/L).  You have a seizure.  You faint. These symptoms may represent a serious problem that is an emergency. Do not wait to see if the symptoms will go away. Get medical help right away. Call your local emergency services (911 in the [place] Summary  Hypoglycemia occurs when the level of sugar (glucose) in the blood is too low.  Hypoglycemia can happen in people who do or do not have diabetes. It can develop quickly, and it can be a medical emergency.  Make sure you know the symptoms of hypoglycemia and how to treat it.  Always have a rapid-acting carbohydrate snack with you to treat low blood sugar. This information is not intended to replace advice given to you by  your health care provider. Make sure you discuss any questions you have with your health care provider. Document Revised: 12/19/2017 Document Reviewed: 08/01/2015 Elsevier Patient Education  [employer].   If you have lab work done today you will be contacted with your lab results within the next 2 weeks.  If you have not heard from [place] then please contact us. The fastest way to get your results is to register for My Chart.   IF you received an x-ray today, you will receive an invoice from [facility]. Please contact [facility] at [phone] with questions or concerns regarding your invoice.   IF you received labwork today, you will receive an invoice from [facility]. Please contact [facility] at [phone] with questions or concerns regarding your invoice.   Our billing staff will not be able to assist you with questions regarding bills from these companies.  You will be contacted with the lab results as soon as they are available. The fastest way to get your results is to activate your My Chart account. Instructions are located on the last page of this paperwork. If you have not heard from [place] regarding the results in 2 weeks, please contact this office.         Signed, Merri Ray, MD [facility]

## 2019-12-01 LAB — SPECIMEN STATUS REPORT

## 2019-12-02 ENCOUNTER — Encounter: Payer: Self-pay | Admitting: Gastroenterology

## 2019-12-06 ENCOUNTER — Ambulatory Visit (INDEPENDENT_AMBULATORY_CARE_PROVIDER_SITE_OTHER): Payer: Medicare Other | Admitting: Family Medicine

## 2019-12-06 ENCOUNTER — Other Ambulatory Visit: Payer: Self-pay

## 2019-12-06 ENCOUNTER — Encounter: Payer: Self-pay | Admitting: Family Medicine

## 2019-12-06 VITALS — BP 109/60 | HR 73 | Temp 98.6°F | Ht 66.0 in | Wt 131.0 lb

## 2019-12-06 DIAGNOSIS — I2510 Atherosclerotic heart disease of native coronary artery without angina pectoris: Secondary | ICD-10-CM

## 2019-12-06 DIAGNOSIS — S80812D Abrasion, left lower leg, subsequent encounter: Secondary | ICD-10-CM

## 2019-12-06 DIAGNOSIS — E1122 Type 2 diabetes mellitus with diabetic chronic kidney disease: Secondary | ICD-10-CM

## 2019-12-06 LAB — TSH: TSH: 4.74 u[IU]/mL — ABNORMAL HIGH (ref 0.450–4.500)

## 2019-12-06 LAB — HEMOGLOBIN A1C
Est. average glucose Bld gHb Est-mCnc: 151 mg/dL
Hgb A1c MFr Bld: 6.9 % — ABNORMAL HIGH (ref 4.8–5.6)

## 2019-12-06 NOTE — Progress Notes (Signed)
Subjective:  Patient ID: Adam Reid, male    DOB: [birth year]  Age: 74 y.o. MRN: [id number]  CC:  Chief Complaint  Patient presents with  . Diabetes    pt states his Morning BS check has improved since last visit. pt reports his morning BS would be in the '180mg'$ /dl range now it has droped to 150/'16mg'$ /dl range. pt has been checkin g BS 3 times daily per Doctors request.  . leg wound    on pt's R leg. pt states since last time the doctor looked at the wound it has improved. pt has done a better job of keeping it clean since last visit. wound dosen't cause any pain per pt.   Wife present and translating.   HPI Adam Reid presents for  Diabetes Complicated by chronic kidney disease, CHF, hypoglycemia Last discussed May 21.  Glipizide decreased to just 10 mg from 12.5 mg and stopped Actos given history of heart failure.  He was continued on Januvia.  Option of SGLT2 but would need to be discussed with nephrology given his CKD.  Stressed importance of regular meals. Home readings: fasting: 160-187, afternoon (few hrs after meals)100-140, evening (114-172).  Breakfast at 10:30, afternoon snack, dinner at 6pm.  No symptomatic lows. Lowest 100  Lab Results  Component Value Date   HGBA1C 6.9 (H) 11/30/2019    Of note TSH borderline elevated with history of amiodarone use, will forward to his cardiologist/heart failure specialist.  Recommend recheck next 6 weeks to follow trend along with free T4. Lab Results  Component Value Date   TSH 4.740 (H) 11/30/2019    Left leg wound Chronic edema with history of CHF, small skin tear/leg wound noted at 5/21 visit.  Wound care discussed with soap and water, clean bandage.  Leg elevation to help with swelling. Cleaning with soap and water, no fever, no redness. Feels better.`  History Patient Active Problem List   Diagnosis Date Noted  . Acute CHF (congestive heart failure) ([place]) 09/24/2019  . ARF (acute renal failure) ([place]) 09/24/2019   . Anemia 09/24/2019  . Acute respiratory failure with hypoxia ([place])   . Secondary hyperparathyroidism, renal ([place]) 12/29/2016  . Smoker 03/26/2016  . Hypotension 01/29/2016  . Abnormal myocardial perfusion study 01/29/2016  . Depression 12/09/2015  . Hyperlipidemia LDL goal <100 06/28/2014  . Diabetes mellitus with renal manifestations, controlled ([place])   . HTN (hypertension) 06/24/2014  . 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 BPH (benign prostatic hyperplasia) 06/24/2014   Past Medical History:  Diagnosis Date  . Anemia   . CHF (congestive heart failure) ([place])   . Chronic kidney disease   . Depression   . Diabetes mellitus without complication ([place])   . Hyperlipidemia   . Hypertension   . Thyroid disease    was on supplement, taken off by [name]   Past Surgical History:  Procedure Laterality Date  . APPENDECTOMY    . CARDIAC CATHETERIZATION N/A 01/30/2016   Procedure: Left Heart Cath and Coronary Angiography;  Surgeon: [name], MD;  Location: [facility];  Service: Cardiovascular;  Laterality: N/A;   No Known Allergies Prior to Admission medications   Medication Sig Start Date End Date Taking? Authorizing Provider  albuterol (VENTOLIN HFA) 108 (90 Base) MCG/ACT inhaler Inhale 2 puffs into the lungs every 6 (six) hours as needed for wheezing or shortness of breath. 07/02/19  Yes Maximiano Coss, NP  amiodarone (Rafael Hernandez)  200 MG tablet Take 1 tablet (200 mg total) by mouth daily. 11/27/19  Yes Clegg, Amy D, NP  apixaban (ELIQUIS) 2.5 MG TABS tablet Take 1 tablet (2.5 mg total) by mouth 2 (two) times daily. 11/05/19  Yes Simmons, Brittainy M, PA-C  atorvastatin (LIPITOR) 80 MG tablet TAKE 1 TABLET(80 MG) BY MOUTH DAILY 07/20/19  Yes [name], MD  blood glucose meter kit and supplies Use up to two times daily as directed  ICD10 E10.9 E11.9 02/20/19  Yes [name], MD  Blood Glucose Monitoring  Suppl (ONETOUCH VERIO) w/Device KIT as directed.  09/03/15  Yes [provider]  citalopram (CELEXA) 20 MG tablet TAKE 1 TABLET(20 MG) BY MOUTH DAILY 07/20/19  Yes [name], MD  cyanocobalamin 500 MCG tablet Take 500 mcg by mouth daily. Reported on 12/02/2015   Yes [provider]  diltiazem (CARDIZEM CD) 180 MG 24 hr capsule Take 1 capsule (180 mg total) by mouth daily. 09/27/19  Yes Pokhrel, Laxman, MD  glipiZIDE (GLUCOTROL XL) 10 MG 24 hr tablet TAKE 1 TABLET(10 MG) BY MOUTH DAILY WITH BREAKFAST Patient taking differently: Take 12.5 mg by mouth daily with breakfast.  07/30/19  Yes [name], MD  GlucoCom Lancets MISC Use for home glucose monitoring 09/02/15  Yes Jeffery, Chelle, PA  glucose blood (ONETOUCH VERIO) test strip Test up to 2 times per day.  Uncontrolled diabetes with hyperglycemia and stage 4 CKD. 04/13/19  Yes [name], MD  JANUVIA 25 MG tablet TAKE 1 TABLET(25 MG) BY MOUTH DAILY 10/10/19  Yes [name], MD  Omega 3-6-9 Fatty Acids (OMEGA 3-6-9 COMPLEX PO) Take 1 tablet by mouth daily.   Yes [provider]  polyethylene glycol (MIRALAX / GLYCOLAX) 17 g packet Take 17 g by mouth daily as needed for mild constipation or moderate constipation.    Yes [provider]  senna (SENOKOT) 8.6 MG tablet Take 1 tablet by mouth as needed for constipation.    Yes [provider]  tamsulosin (FLOMAX) 0.4 MG CAPS capsule TAKE 2 CAPSULES(0.8 MG) BY MOUTH DAILY 07/20/19  Yes [name], MD  torsemide (DEMADEX) 20 MG tablet Take 2 tablets (40 mg total) by mouth in the morning AND 1 tablet (20 mg total) every evening. 11/05/19  Yes Consuelo Pandy, PA-C   Social History   Socioeconomic History  . Marital status: Married    Spouse name: [name]  . Number of children: 1  . Years of education: 12th grade  . Highest education level: Not on file  Occupational History  . Occupation: Retired-accountant  Tobacco Use  .  Smoking status: Light Tobacco Smoker    Packs/day: 0.10    Years: 48.00    Pack years: 4.80    Types: Cigarettes    Last attempt to quit: 07/16/2019    Years since quitting: 0.3  . Smokeless tobacco: Never Used  . Tobacco comment: per pt he has one cigarette every couple days  Substance and Sexual Activity  . Alcohol use: No    Alcohol/week: 0.0 standard drinks  . Drug use: No  . Sexual activity: Never    Partners: Female  Other Topics Concern  . Not on file  Social History Narrative   Originally from [place]. Came to the [place] in 2009, following their son who came here for school.   Married.   Lives with his wife.   Their adult son lives in [place], where he is in  optometry school.   Education: [employer]   Exercise: No   Social Determinants of Radio broadcast assistant Strain:   . Difficulty of Paying Living Expenses:   Food Insecurity:   . Worried About Charity fundraiser in the Last Year:   . Arboriculturist in the Last Year:   Transportation Needs:   . Film/video editor (Medical):   [name] Lack of Transportation (Non-Medical):   Physical Activity:   . Days of Exercise per Week:   . Minutes of Exercise per Session:   Stress:   . Feeling of Stress :   Social Connections:   . Frequency of Communication with Friends and Family:   . Frequency of Social Gatherings with Friends and Family:   . Attends Religious Services:   . Active Member of Clubs or Organizations:   . Attends Archivist Meetings:   [name] Marital Status:   Intimate Partner Violence:   . Fear of Current or Ex-Partner:   . Emotionally Abused:   [name] Physically Abused:   . Sexually Abused:     Review of Systems   Objective:   Vitals:   12/06/19 0857  BP: 109/60  Pulse: 73  Temp: 98.6 F (37 C)  TempSrc: Temporal  SpO2: 96%  Weight: 131 lb (59.4 kg)  Height: '5\' 6"'$  (1.676 m)     Physical Exam Constitutional:      General: He is not in acute distress.    Appearance: He is  well-developed.  HENT:     Head: Normocephalic and atraumatic.  Cardiovascular:     Rate and Rhythm: Normal rate.  Pulmonary:     Effort: Pulmonary effort is normal.  Neurological:     Mental Status: He is alert and oriented to person, place, and time.   Minimal/trace lower extremity edema without appreciable pitting.  Leg wound improving as below in photo.       Assessment & Plan:  Merik Mignano is a 74 y.o. male . Type 2 diabetes mellitus with chronic kidney disease, without long-term current use of insulin, unspecified CKD stage ([place])  -Well-controlled A1c last visit, was having some hypoglycemia.  Some elevated readings but also with 100 no new medications at this time.  Recheck in 1 month, and depending on readings at that time consider addition of SGLT2, with consultation with nephrology.  Could also decrease glipizide at that time if needed.  -Borderline TSH, recheck in 1 month with free T4.  Will monitor with use of amiodarone.  Abrasion of left lower extremity, subsequent encounter  -Improving, continue wound care with RTC precautions  No orders of the defined types were placed in this encounter.  Patient Instructions    No medication changes for now.  Diabetes test from last visit looked okay.  Follow-up in 1 month, and if still some elevated readings, can consider a new class of medication called SGLT2, but that will need to be discussed with your nephrologist.  Leg wound is looking good.  Continue to keep that area clean, return if any redness or worsening.   If you have lab work done today you will be contacted with your lab results within the next 2 weeks.  If you have not heard from [place] then please contact us. The fastest way to get your results is to register for My Chart.   IF you received an x-ray today, you will receive an invoice from [facility]. Please contact [facility] at [phone] with  questions or concerns regarding your  invoice.   IF you received labwork today, you will receive an invoice from [facility]. Please contact [facility] at [phone] with questions or concerns regarding your invoice.   Our billing staff will not be able to assist you with questions regarding bills from these companies.  You will be contacted with the lab results as soon as they are available. The fastest way to get your results is to activate your My Chart account. Instructions are located on the last page of this paperwork. If you have not heard from [place] regarding the results in 2 weeks, please contact this office.         Signed, Merri Ray, MD [facility]

## 2019-12-06 NOTE — Patient Instructions (Addendum)
  No medication changes for now.  Diabetes test from last visit looked okay.  Follow-up in 1 month, and if still some elevated readings, can consider a new class of medication called SGLT2, but that will need to be discussed with your nephrologist.  Leg wound is looking good.  Continue to keep that area clean, return if any redness or worsening.   If you have lab work done today you will be contacted with your lab results within the next 2 weeks.  If you have not heard from [place] then please contact us. The fastest way to get your results is to register for My Chart.   IF you received an x-ray today, you will receive an invoice from [facility]. Please contact [facility] at [phone] with questions or concerns regarding your invoice.   IF you received labwork today, you will receive an invoice from [facility]. Please contact [facility] at [phone] with questions or concerns regarding your invoice.   Our billing staff will not be able to assist you with questions regarding bills from these companies.  You will be contacted with the lab results as soon as they are available. The fastest way to get your results is to activate your My Chart account. Instructions are located on the last page of this paperwork. If you have not heard from [place] regarding the results in 2 weeks, please contact this office.

## 2019-12-07 ENCOUNTER — Encounter (HOSPITAL_COMMUNITY): Payer: Medicare Other

## 2019-12-07 ENCOUNTER — Encounter (HOSPITAL_COMMUNITY)
Admission: RE | Admit: 2019-12-07 | Discharge: 2019-12-07 | Disposition: A | Discharge status: Home / Self Care | Payer: Medicare Other | Source: Ambulatory Visit | Attending: Nephrology | Admitting: Nephrology

## 2019-12-07 VITALS — BP 143/69 | HR 72 | Temp 96.8°F | Resp 20

## 2019-12-07 DIAGNOSIS — N179 Acute kidney failure, unspecified: Secondary | ICD-10-CM

## 2019-12-07 DIAGNOSIS — D631 Anemia in chronic kidney disease: Secondary | ICD-10-CM | POA: Diagnosis not present

## 2019-12-07 DIAGNOSIS — N184 Chronic kidney disease, stage 4 (severe): Secondary | ICD-10-CM | POA: Diagnosis not present

## 2019-12-07 LAB — IRON AND TIBC
Iron: 34 ug/dL — ABNORMAL LOW (ref 45–182)
Saturation Ratios: 12 % — ABNORMAL LOW (ref 17.9–39.5)
TIBC: 279 ug/dL (ref 250–450)
UIBC: 245 ug/dL

## 2019-12-07 LAB — FERRITIN: Ferritin: 1199 ng/mL — ABNORMAL HIGH (ref 24–336)

## 2019-12-07 LAB — POCT HEMOGLOBIN-HEMACUE: Hemoglobin: 10 g/dL — ABNORMAL LOW (ref 13.0–17.0)

## 2019-12-07 MED ORDER — EPOETIN ALFA-EPBX 10000 UNIT/ML IJ SOLN
INTRAMUSCULAR | Status: AC
  Filled 2019-12-07: qty 1

## 2019-12-07 MED ORDER — EPOETIN ALFA-EPBX 10000 UNIT/ML IJ SOLN
10000.0000 [IU] | INTRAMUSCULAR | Status: DC
  Administered 2019-12-07: 10000 [IU] via SUBCUTANEOUS

## 2019-12-07 NOTE — Addendum Note (Signed)
Encounter addended by: Micki Riley, RN on: 12/07/2019 4:19 PM  Actions taken: Imaging Exam ended

## 2019-12-14 ENCOUNTER — Encounter (HOSPITAL_COMMUNITY): Payer: Medicare Other

## 2019-12-25 ENCOUNTER — Encounter: Payer: Self-pay | Admitting: Physician Assistant

## 2019-12-25 ENCOUNTER — Ambulatory Visit (INDEPENDENT_AMBULATORY_CARE_PROVIDER_SITE_OTHER): Payer: Medicare Other | Admitting: Physician Assistant

## 2019-12-25 VITALS — BP 98/60 | HR 72 | Ht 66.0 in | Wt 133.0 lb

## 2019-12-25 DIAGNOSIS — K2970 Gastritis, unspecified, without bleeding: Secondary | ICD-10-CM | POA: Diagnosis not present

## 2019-12-25 DIAGNOSIS — I2510 Atherosclerotic heart disease of native coronary artery without angina pectoris: Secondary | ICD-10-CM

## 2019-12-25 DIAGNOSIS — K5900 Constipation, unspecified: Secondary | ICD-10-CM

## 2019-12-25 DIAGNOSIS — D509 Iron deficiency anemia, unspecified: Secondary | ICD-10-CM | POA: Diagnosis not present

## 2019-12-25 DIAGNOSIS — R142 Eructation: Secondary | ICD-10-CM | POA: Diagnosis not present

## 2019-12-25 MED ORDER — PANTOPRAZOLE SODIUM 40 MG PO TBEC
40.0000 mg | DELAYED_RELEASE_TABLET | Freq: Every day | ORAL | 11 refills | Status: DC
Start: 2019-12-25 — End: 2021-01-16

## 2019-12-25 NOTE — Progress Notes (Signed)
Chief Complaint: Follow-up IDA  HPI:    Adam Reid is a 74 year old male from [place], known to Dr. Silverio Decamp, with a past medical history as listed below including CKD stage IV, who returns to clinic today for follow-up after his recent [place].     11/13/2019 patient seen in clinic by me for iron deficiency anemia.  At that time patient had been doing well other than his recent hospitalization for CHF and the addition of a blood thinner and diuretic.    11/20/2019 colonoscopy for IDA and a personal history of colon polyps with colonic polyps status post polypectomy, mild predominant the sigmoid diverticulosis, nonbleeding internal hemorrhoids and otherwise grossly normal.  Pathology showed adenomatous polyps.  EGD on the same day for epigastric abdominal pain and IDA with Z-line irregular, incidental submucosal mild esophageal lesion, otherwise normal EGD.  It was discussed that if esophageal lesion needs to be further investigated would recommend EUS, but certainly not causing any problems.  Pathology showed peptic duodenitis and gastritis as well as a short segment of Barrett's esophagus.  Repeat EGD and colonoscopy recommended in 3 years.    12/07/2019 iron studies show an iron low at 34, percent saturation low at 12.  Ferritin extremely elevated at 1199.  Hemoglobin 10 (8.2 on 11/09/2019).    Today, the patient presents to clinic accompanied by his wife who translates for him.  She explains that he is having trouble with hard stools, but is not currently using his Senokot or his MiraLAX.    They also discussed the patient has an increase in eructations which has been going on for "sometime".  They tell me that he has never been on reflux medicine.    Patient continues to follow with his nephrologist in regards to his IDA.  They are currently giving him shots to "stimulate blood cell production" per his wife.    Denies fever, chills, weight loss or symptoms that awaken him from sleep.  Past Medical  History:  Diagnosis Date  . Anemia   . CHF (congestive heart failure) ([place])   . Chronic kidney disease   . Depression   . Diabetes mellitus without complication ([name])   . Hyperlipidemia   . Hypertension   . Thyroid disease    was on supplement, taken off by [name]    Past Surgical History:  Procedure Laterality Date  . APPENDECTOMY    . CARDIAC CATHETERIZATION N/A 01/30/2016   Procedure: Left Heart Cath and Coronary Angiography;  Surgeon: [name], MD;  Location: [facility];  Service: Cardiovascular;  Laterality: N/A;    Current Outpatient Medications  Medication Sig Dispense Refill  . albuterol (VENTOLIN HFA) 108 (90 Base) MCG/ACT inhaler Inhale 2 puffs into the lungs every 6 (six) hours as needed for wheezing or shortness of breath. 18 g 0  . amiodarone (PACERONE) 200 MG tablet Take 1 tablet (200 mg total) by mouth daily. 30 tablet 6  . apixaban (ELIQUIS) 2.5 MG TABS tablet Take 1 tablet (2.5 mg total) by mouth 2 (two) times daily. 60 tablet 6  . atorvastatin (LIPITOR) 80 MG tablet TAKE 1 TABLET(80 MG) BY MOUTH DAILY 90 tablet 1  . blood glucose meter kit and supplies Use up to two times daily as directed  ICD10 E10.9 E11.9 1 each 0  . Blood Glucose Monitoring Suppl (ONETOUCH VERIO) w/Device KIT as directed.   0  . citalopram (CELEXA) 20 MG tablet TAKE 1 TABLET(20 MG) BY MOUTH DAILY 90 tablet  1  . cyanocobalamin 500 MCG tablet Take 500 mcg by mouth daily. Reported on 12/02/2015    . diltiazem (CARDIZEM CD) 180 MG 24 hr capsule Take 1 capsule (180 mg total) by mouth daily. 30 capsule 2  . glipiZIDE (GLUCOTROL XL) 10 MG 24 hr tablet TAKE 1 TABLET(10 MG) BY MOUTH DAILY WITH BREAKFAST (Patient taking differently: Take 12.5 mg by mouth daily with breakfast. ) 90 tablet 1  . GlucoCom Lancets MISC Use for home glucose monitoring 100 each 3  . glucose blood (ONETOUCH VERIO) test strip Test up to 2 times per day.  Uncontrolled diabetes with hyperglycemia and stage 4 CKD.  100 each 3  . JANUVIA 25 MG tablet TAKE 1 TABLET(25 MG) BY MOUTH DAILY 90 tablet 0  . Omega 3-6-9 Fatty Acids (OMEGA 3-6-9 COMPLEX PO) Take 1 tablet by mouth daily.    . polyethylene glycol (MIRALAX / GLYCOLAX) 17 g packet Take 17 g by mouth daily as needed for mild constipation or moderate constipation.     . senna (SENOKOT) 8.6 MG tablet Take 1 tablet by mouth as needed for constipation.     . tamsulosin (FLOMAX) 0.4 MG CAPS capsule TAKE 2 CAPSULES(0.8 MG) BY MOUTH DAILY 180 capsule 1  . torsemide (DEMADEX) 20 MG tablet Take 2 tablets (40 mg total) by mouth in the morning AND 1 tablet (20 mg total) every evening. 90 tablet 2   No current facility-administered medications for this visit.    Allergies as of 12/25/2019  . (No Known Allergies)    Family History  Problem Relation Age of Onset  . Hypertension Mother   . Hyperlipidemia Mother   . Hypertension Sister   . Kidney disease Father   . Heart disease Brother 76       open heart surgery  . Colon cancer Neg Hx     Social History   Socioeconomic History  . Marital status: Married    Spouse name: Adam Reid  . Number of children: 1  . Years of education: 12th grade  . Highest education level: Not on file  Occupational History  . Occupation: Retired-accountant  Tobacco Use  . Smoking status: Light Tobacco Smoker    Packs/day: 0.10    Years: 48.00    Pack years: 4.80    Types: Cigarettes    Last attempt to quit: 07/16/2019    Years since quitting: 0.4  . Smokeless tobacco: Never Used  . Tobacco comment: per pt he has one cigarette every couple days  Vaping Use  . Vaping Use: Never used  Substance and Sexual Activity  . Alcohol use: No    Alcohol/week: 0.0 standard drinks  . Drug use: No  . Sexual activity: Never    Partners: Female  Other Topics Concern  . Not on file  Social History Narrative   Originally from [place]. Came to the [place] in 2009, following their son who came here for school.   Married.   Lives with his  wife.   Their adult son lives in [place], [place], where he is in optometry school.   Education: [employer]   Exercise: No   Social Determinants of Radio broadcast assistant Strain:   . Difficulty of Paying Living Expenses:   Food Insecurity:   . Worried About Charity fundraiser in the Last Year:   . Arboriculturist in the Last Year:   Transportation Needs:   . Film/video editor (Medical):   [name] Lack of  Transportation (Non-Medical):   Physical Activity:   . Days of Exercise per Week:   . Minutes of Exercise per Session:   Stress:   . Feeling of Stress :   Social Connections:   . Frequency of Communication with Friends and Family:   . Frequency of Social Gatherings with Friends and Family:   . Attends Religious Services:   . Active Member of Clubs or Organizations:   . Attends Archivist Meetings:   [name] Marital Status:   Intimate Partner Violence:   . Fear of Current or Ex-Partner:   . Emotionally Abused:   [name] Physically Abused:   . Sexually Abused:     Review of Systems:    Constitutional: No weight loss, fever or chills Cardiovascular: No chest pain Respiratory: No SOB  Gastrointestinal: See HPI and otherwise negative   Physical Exam:  Vital signs:  BP 98/60 (BP Location: Left Arm, Patient Position: Sitting)   Pulse 72   Ht 5' 6" (1.676 m)   Wt 133 lb (60.3 kg)   SpO2 96%   BMI 21.47 kg/m   Constitutional:   [place] male appears to be in NAD, Well developed, Well nourished, alert and cooperative Respiratory: Respirations even and unlabored. Lungs clear to auscultation bilaterally.   No wheezes, crackles, or rhonchi.  Cardiovascular: Normal S1, S2. No MRG. Regular rate and rhythm. No peripheral edema, cyanosis or pallor.  Gastrointestinal:  Soft, nondistended, nontender. No rebound or guarding. Normal bowel sounds. No appreciable masses or hepatomegaly. Rectal:  Not performed.  Psychiatric:  Demonstrates good judgement and reason without  abnormal affect or behaviors.  RELEVANT LABS AND IMAGING: CBC    Component Value Date/Time   WBC 8.7 11/05/2019 1230   RBC 3.46 (L) 11/05/2019 1230   HGB 10.0 (L) 12/07/2019 0820   HGB 9.1 (L) 09/14/2019 1555   HCT 28.3 (L) 11/05/2019 1230   HCT 28.4 (L) 09/14/2019 1555   PLT 503 (H) 11/05/2019 1230   PLT 464 (H) 09/14/2019 1555   MCV 81.8 11/05/2019 1230   MCV 82 09/14/2019 1555   MCH 24.6 (L) 11/05/2019 1230   MCHC 30.0 11/05/2019 1230   RDW 17.2 (H) 11/05/2019 1230   RDW 16.4 (H) 09/14/2019 1555   LYMPHSABS 0.7 09/23/2019 2338   LYMPHSABS 0.6 (L) 10/05/2017 1433   MONOABS 0.9 09/23/2019 2338   EOSABS 0.0 09/23/2019 2338   EOSABS 0.1 10/05/2017 1433   BASOSABS 0.0 09/23/2019 2338   BASOSABS 0.0 10/05/2017 1433    CMP     Component Value Date/Time   NA 135 11/05/2019 1230   NA 135 10/03/2019 0836   K 3.6 11/05/2019 1230   CL 97 (L) 11/05/2019 1230   CO2 24 11/05/2019 1230   GLUCOSE 289 (H) 11/05/2019 1230   BUN 60 (H) 11/05/2019 1230   BUN 73 (H) 10/03/2019 0836   CREATININE 2.52 (H) 11/05/2019 1230   CREATININE 2.68 (H) 03/26/2016 1839   CALCIUM 9.1 11/05/2019 1230   PROT 6.9 08/27/2019 0850   ALBUMIN 3.8 07/20/2019 1406   AST 15 07/20/2019 1406   ALT 13 07/20/2019 1406   ALKPHOS 91 07/20/2019 1406   BILITOT 0.3 07/20/2019 1406   GFRNONAA 24 (L) 11/05/2019 1230   GFRAA 28 (L) 11/05/2019 1230    Assessment: 1.  IDA: Recent EGD and colonoscopy with no GI etiology, recommend he continue to follow with nephrology 2.  Gastritis with intestinal metaplasia: Seen at time of recent EGD 3.  Constipation: Harder stools every  few days  Plan: 1.  Discussed results of EGD and colonoscopy with the patient today.  There was no cause for his anemia found.  Recommend that he continue to follow with his nephrologist in regards to this. 2.  Would recommend the patient start using his MiraLAX once daily to help with hard stools. 3.  Started the patient on Pantoprazole 40 mg  daily, 30 to 60 minutes before eating breakfast #30 with 11 refills.  Discussed that he should stay on this medication indefinitely given finding of gastritis with intestinal metaplasia on recent EGD. 4.  Discussed recommendations for repeat EGD and colonoscopy in 3 years with the patient and his wife. 5.  Patient to follow in clinic with [place] as needed before time for his next EGD/colonoscopy.  Ellouise Newer, PA-C [facility] 12/25/2019, 2:39 PM  Cc: [name], MD

## 2019-12-25 NOTE — Patient Instructions (Signed)
If you are age 73 or older, your body mass index should be between 23-30. Your Body mass index is 21.47 kg/m. If this is out of the aforementioned range listed, please consider follow up with your Primary Care Provider.  If you are age 39 or younger, your body mass index should be between 19-25. Your Body mass index is 21.47 kg/m. If this is out of the aformentioned range listed, please consider follow up with your Primary Care Provider.   We have sent the following medications to your pharmacy for you to pick up at your convenience:  Pantoprazole 40 mg  Miralax daily  Follow up as needed  Due to recent changes in healthcare laws, you may see the results of your imaging and laboratory studies on MyChart before your provider has had a chance to review them.  We understand that in some cases there may be results that are confusing or concerning to you. Not all laboratory results come back in the same time frame and the provider may be waiting for multiple results in order to interpret others.  Please give [place] 48 hours in order for your provider to thoroughly review all the results before contacting the office for clarification of your results.

## 2019-12-26 ENCOUNTER — Other Ambulatory Visit: Payer: Self-pay | Admitting: Cardiology

## 2020-01-03 ENCOUNTER — Encounter: Payer: Self-pay | Admitting: Family Medicine

## 2020-01-03 ENCOUNTER — Ambulatory Visit: Payer: Medicare Other

## 2020-01-03 ENCOUNTER — Other Ambulatory Visit: Payer: Self-pay | Admitting: Family Medicine

## 2020-01-03 ENCOUNTER — Other Ambulatory Visit: Payer: Self-pay

## 2020-01-03 ENCOUNTER — Ambulatory Visit (INDEPENDENT_AMBULATORY_CARE_PROVIDER_SITE_OTHER): Payer: Medicare Other | Admitting: Family Medicine

## 2020-01-03 VITALS — BP 125/70 | HR 75 | Temp 98.6°F | Ht 66.0 in | Wt 132.0 lb

## 2020-01-03 DIAGNOSIS — I2510 Atherosclerotic heart disease of native coronary artery without angina pectoris: Secondary | ICD-10-CM

## 2020-01-03 DIAGNOSIS — S80812D Abrasion, left lower leg, subsequent encounter: Secondary | ICD-10-CM | POA: Diagnosis not present

## 2020-01-03 DIAGNOSIS — E11649 Type 2 diabetes mellitus with hypoglycemia without coma: Secondary | ICD-10-CM | POA: Diagnosis not present

## 2020-01-03 DIAGNOSIS — E1122 Type 2 diabetes mellitus with diabetic chronic kidney disease: Secondary | ICD-10-CM

## 2020-01-03 DIAGNOSIS — Z111 Encounter for screening for respiratory tuberculosis: Secondary | ICD-10-CM | POA: Diagnosis not present

## 2020-01-03 DIAGNOSIS — R7989 Other specified abnormal findings of blood chemistry: Secondary | ICD-10-CM

## 2020-01-03 DIAGNOSIS — Z79899 Other long term (current) drug therapy: Secondary | ICD-10-CM

## 2020-01-03 MED ORDER — GLIPIZIDE ER 5 MG PO TB24: 5.0000 mg | ORAL_TABLET | Freq: Every day | ORAL | 1 refills | Status: DC

## 2020-01-03 NOTE — Progress Notes (Signed)
Subjective:  Patient ID: Adam Reid, male    DOB: November 01, 1945  Age: 74 y.o. MRN: [id number]  CC:  Chief Complaint  Patient presents with  . Follow-up    on Diabetes and abrasion on L shin. pt reports the abrasion is gone and feels better. pt reports no issues with his diabetes since last OV. other than pt's BS drops around 2-3 o'clock, but pt is aware of this drop and planes accordingly by having a snack or somthing to help keep it from droping.  . Requestion a TB test    Pt's job is asking for the pt to have a TB test.     HPI Adam Reid presents for   Diabetes: Here with spouse, translating.   Complicated by chronic kidney disease, CHF, prior hypoglycemia. Previously stopped Actos given history of CHF.  Was continued on Januvia, glipizide had been decreased from 12.5 mg to 10 mg.  Glipizide and Januvia option of SGLT2 if persistent elevated readings, but that would need to be discussed with his nephrologist. BF 10:30, lunch 3-4 pm, dinner 6pm.  Home readings: Fasting: Ranging from a low of 123 to high of 186. Before lunch: Lows ranging from 46, 47, 48, 49, 52.- sweating at that time.  High of 185. 3-4 hrs After dinner: Low of 86, usually in the mid 100s, 2 readings over 200 at 225 and 226.     Lab Results  Component Value Date   HGBA1C 6.9 (H) 11/30/2019   HGBA1C 7.3 (A) 07/20/2019   HGBA1C 7.8 (A) 04/13/2019   Lab Results  Component Value Date   MICROALBUR 60.3 09/02/2015   LDLCALC 57 07/20/2019   CREATININE 2.52 (H) 11/05/2019   Elevated TSH: Previously checked with history of amiodarone use, borderline in May.  Plan for recheck within 6 weeks. Requests today.   Left leg wound See prior visit May 31.  Improving at that time from visit 10 days sooner.  Chronic edema with history of CHF.  Continue symptomatic care discussed. Area has improved.   PPD requested for work - will be helping to take care of father in law. Has to have ppd to be able to provide  this care.  No prior PPD.  No known BCG vaccine.   History Patient Active Problem List   Diagnosis Date Noted  . Acute CHF (congestive heart failure) ([place]) 09/24/2019  . ARF (acute renal failure) ([place]) 09/24/2019  . Anemia 09/24/2019  . Acute respiratory failure with hypoxia ([place])   . Secondary hyperparathyroidism, renal ([place]) 12/29/2016  . Smoker 03/26/2016  . Hypotension 01/29/2016  . Abnormal myocardial perfusion study 01/29/2016  . Depression 12/09/2015  . Hyperlipidemia LDL goal <100 06/28/2014  . Diabetes mellitus with renal manifestations, controlled ([place])   . HTN (hypertension) 06/24/2014  . 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 BPH (benign prostatic hyperplasia) 06/24/2014   Past Medical History:  Diagnosis Date  . Anemia   . CHF (congestive heart failure) ([place])   . Chronic kidney disease   . Depression   . Diabetes mellitus without complication ([place])   . Hyperlipidemia   . Hypertension   . Thyroid disease    was on supplement, taken off by [name]   Past Surgical History:  Procedure Laterality Date  . APPENDECTOMY    . CARDIAC CATHETERIZATION N/A 01/30/2016   Procedure: Left Heart Cath and Coronary Angiography;  Surgeon: [name], MD;  Location: [facility];  Service: Cardiovascular;  Laterality: N/A;   No Known Allergies Prior to Admission medications   Medication Sig Start Date End Date Taking? Authorizing Provider  amiodarone (PACERONE) 200 MG tablet Take 1 tablet (200 mg total) by mouth daily. 11/27/19  Yes Clegg, Amy D, NP  apixaban (ELIQUIS) 2.5 MG TABS tablet Take 1 tablet (2.5 mg total) by mouth 2 (two) times daily. Patient taking differently: Take 2.5 mg by mouth daily.  11/05/19  Yes Simmons, Brittainy M, PA-C  atorvastatin (LIPITOR) 80 MG tablet TAKE 1 TABLET(80 MG) BY MOUTH DAILY 07/20/19  Yes [name], MD  blood glucose meter kit and supplies Use up to two times daily as  directed  ICD10 E10.9 E11.9 02/20/19  Yes [name], MD  Blood Glucose Monitoring Suppl (ONETOUCH VERIO) w/Device KIT as directed.  09/03/15  Yes [provider]  citalopram (CELEXA) 20 MG tablet TAKE 1 TABLET(20 MG) BY MOUTH DAILY 07/20/19  Yes [name], MD  cyanocobalamin 500 MCG tablet Take 500 mcg by mouth daily. Reported on 12/02/2015   Yes [provider]  diltiazem (CARDIZEM CD) 180 MG 24 hr capsule TAKE 1 CAPSULE(180 MG) BY MOUTH DAILY 12/27/19  Yes Cherlynn Kaiser A, MD  glipiZIDE (GLUCOTROL XL) 10 MG 24 hr tablet TAKE 1 TABLET(10 MG) BY MOUTH DAILY WITH BREAKFAST Patient taking differently: Take 12.5 mg by mouth daily with breakfast.  07/30/19  Yes [name], MD  GlucoCom Lancets MISC Use for home glucose monitoring 09/02/15  Yes Jeffery, Chelle, PA  glucose blood (ONETOUCH VERIO) test strip Test up to 2 times per day.  Uncontrolled diabetes with hyperglycemia and stage 4 CKD. 04/13/19  Yes [name], MD  JANUVIA 25 MG tablet TAKE 1 TABLET(25 MG) BY MOUTH DAILY 10/10/19  Yes [name], MD  Omega 3-6-9 Fatty Acids (OMEGA 3-6-9 COMPLEX PO) Take 1 tablet by mouth daily.   Yes [provider]  pantoprazole (PROTONIX) 40 MG tablet Take 1 tablet (40 mg total) by mouth daily. 12/25/19  Yes Lemmon, Lavone Nian, PA  polyethylene glycol (MIRALAX / GLYCOLAX) 17 g packet Take 17 g by mouth daily as needed for mild constipation or moderate constipation.    Yes [provider]  senna (SENOKOT) 8.6 MG tablet Take 1 tablet by mouth as needed for constipation.    Yes [provider]  tamsulosin (FLOMAX) 0.4 MG CAPS capsule TAKE 2 CAPSULES(0.8 MG) BY MOUTH DAILY 07/20/19  Yes [name], MD  torsemide (DEMADEX) 20 MG tablet Take 2 tablets (40 mg total) by mouth in the morning AND 1 tablet (20 mg total) every evening. Patient taking differently: Take 1 tablets (40 mg total) by mouth in the morning AND 1 tablet (20 mg total)  every evening. 11/05/19  Yes Consuelo Pandy, PA-C   Social History   Socioeconomic History  . Marital status: Married    Spouse name: [name]  . Number of children: 1  . Years of education: 12th grade  . Highest education level: Not on file  Occupational History  . Occupation: Retired-accountant  Tobacco Use  . Smoking status: Light Tobacco Smoker    Packs/day: 0.10    Years: 48.00    Pack years: 4.80    Types: Cigarettes    Last attempt to quit: 07/16/2019    Years since quitting: 0.4  . Smokeless tobacco: Never Used  . Tobacco comment: per pt he has one cigarette every couple days  Vaping  Use  . Vaping Use: Never used  Substance and Sexual Activity  . Alcohol use: No    Alcohol/week: 0.0 standard drinks  . Drug use: No  . Sexual activity: Never    Partners: Female  Other Topics Concern  . Not on file  Social History Narrative   Originally from [place]. Came to the [place] in 2009, following their son who came here for school.   Married.   Lives with his wife.   Their adult son lives in [place], [place], where he is in optometry school.   Education: [employer]   Exercise: No   Social Determinants of Radio broadcast assistant Strain:   . Difficulty of Paying Living Expenses:   Food Insecurity:   . Worried About Charity fundraiser in the Last Year:   . Arboriculturist in the Last Year:   Transportation Needs:   . Film/video editor (Medical):   [name] Lack of Transportation (Non-Medical):   Physical Activity:   . Days of Exercise per Week:   . Minutes of Exercise per Session:   Stress:   . Feeling of Stress :   Social Connections:   . Frequency of Communication with Friends and Family:   . Frequency of Social Gatherings with Friends and Family:   . Attends Religious Services:   . Active Member of Clubs or Organizations:   . Attends Archivist Meetings:   [name] Marital Status:   Intimate Partner Violence:   . Fear of Current or Ex-Partner:   . Emotionally  Abused:   [name] Physically Abused:   . Sexually Abused:     Review of Systems Per HPI.   Objective:   Vitals:   01/03/20 1407  BP: 125/70  Pulse: 75  Temp: 98.6 F (37 C)  TempSrc: Temporal  SpO2: 97%  Weight: 132 lb (59.9 kg)  Height: '5\' 6"'$  (1.676 m)     Physical Exam Vitals reviewed.  Constitutional:      Appearance: He is well-developed.  HENT:     Head: Normocephalic and atraumatic.  Eyes:     Pupils: Pupils are equal, round, and reactive to light.  Neck:     Vascular: No carotid bruit or JVD.  Cardiovascular:     Rate and Rhythm: Normal rate and regular rhythm.     Heart sounds: Normal heart sounds. No murmur heard.   Pulmonary:     Effort: Pulmonary effort is normal.     Breath sounds: Normal breath sounds. No rales.  Skin:    General: Skin is warm and dry.     Comments: No edema, previous wound healed  Neurological:     Mental Status: He is alert and oriented to person, place, and time.        Assessment & Plan:  Cordarrell Sane is a 74 y.o. male . Type 2 diabetes mellitus with chronic kidney disease, without long-term current use of insulin, unspecified CKD stage ([place]) - Plan: glipiZIDE (GLUCOTROL XL) 5 MG 24 hr tablet Diabetic hypoglycemia (HCC)  -Multiple lows midafternoon.  Will decrease glipizide dose as that may be contributing, especially with CKD.  Continue Januvia same dose.  Consider SGLT2 if persistent elevations, but would need to discuss with nephrology.  possible goal of less than 7.5 hemoglobin A1c at his age.  Repeat A1c in 2 months, RTC precautions if persistent elevated readings in the 200s or recurrent hypoglycemia sooner.  Screening for tuberculosis - Plan: TB Skin Test next  week.   Abrasion of left lower extremity, subsequent encounter  -Healed.  Edema has improved.  Long term current use of amiodarone - Plan: TSH Elevated TSH - Plan: TSH  Borderline previously, repeat TSH.  Meds ordered this encounter  Medications  .  glipiZIDE (GLUCOTROL XL) 5 MG 24 hr tablet    Sig: Take 1 tablet (5 mg total) by mouth daily with breakfast.    Dispense:  30 tablet    Refill:  1   Patient Instructions   Decrease glipizide to '5mg'$  dose to lessen risk of low readings in afternoon. Continue Januvia same dose. Continue to watch diet.  If blood sugars remaining in 200's, return sooner. If any return of low blood sugars, also return sooner. Continue to watch diet.  Check blood sugars once per day either fasting, 2 hours after meals, or if any symptoms of low blood sugar. recheck in 2 months. If more meds needed, I would want your kidney specialist involved with a new medicine called SGLT2.  Can discuss this at next visit.     Diabetes Mellitus and Nutrition, Adult When you have diabetes (diabetes mellitus), it is very important to have healthy eating habits because your blood sugar (glucose) levels are greatly affected by what you eat and drink. Eating healthy foods in the appropriate amounts, at about the same times every day, can help you:  Control your blood glucose.  Lower your risk of heart disease.  Improve your blood pressure.  Reach or maintain a healthy weight. Every person with diabetes is different, and each person has different needs for a meal plan. Your health care provider may recommend that you work with a diet and nutrition specialist (dietitian) to make a meal plan that is best for you. Your meal plan may vary depending on factors such as:  The calories you need.  The medicines you take.  Your weight.  Your blood glucose, blood pressure, and cholesterol levels.  Your activity level.  Other health conditions you have, such as heart or kidney disease. How do carbohydrates affect me? Carbohydrates, also called carbs, affect your blood glucose level more than any other type of food. Eating carbs naturally raises the amount of glucose in your blood. Carb counting is a method for keeping track of how  many carbs you eat. Counting carbs is important to keep your blood glucose at a healthy level, especially if you use insulin or take certain oral diabetes medicines. It is important to know how many carbs you can safely have in each meal. This is different for every person. Your dietitian can help you calculate how many carbs you should have at each meal and for each snack. Foods that contain carbs include:  Bread, cereal, rice, pasta, and crackers.  Potatoes and corn.  Peas, beans, and lentils.  Milk and yogurt.  Fruit and juice.  Desserts, such as cakes, cookies, ice cream, and candy. How does alcohol affect me? Alcohol can cause a sudden decrease in blood glucose (hypoglycemia), especially if you use insulin or take certain oral diabetes medicines. Hypoglycemia can be a life-threatening condition. Symptoms of hypoglycemia (sleepiness, dizziness, and confusion) are similar to symptoms of having too much alcohol. If your health care provider says that alcohol is safe for you, follow these guidelines:  Limit alcohol intake to no more than 1 drink per day for nonpregnant women and 2 drinks per day for men. One drink equals 12 oz of beer, 5 oz of wine, or 1 oz  of hard liquor.  Do not drink on an empty stomach.  Keep yourself hydrated with water, diet soda, or unsweetened iced tea.  Keep in mind that regular soda, juice, and other mixers may contain a lot of sugar and must be counted as carbs. What are tips for following this plan?  Reading food labels  Start by checking the serving size on the "Nutrition Facts" label of packaged foods and drinks. The amount of calories, carbs, fats, and other nutrients listed on the label is based on one serving of the item. Many items contain more than one serving per package.  Check the total grams (g) of carbs in one serving. You can calculate the number of servings of carbs in one serving by dividing the total carbs by 15. For example, if a food  has 30 g of total carbs, it would be equal to 2 servings of carbs.  Check the number of grams (g) of saturated and trans fats in one serving. Choose foods that have low or no amount of these fats.  Check the number of milligrams (mg) of salt (sodium) in one serving. Most people should limit total sodium intake to less than 2,300 mg per day.  Always check the nutrition information of foods labeled as "low-fat" or "nonfat". These foods may be higher in added sugar or refined carbs and should be avoided.  Talk to your dietitian to identify your daily goals for nutrients listed on the label. Shopping  Avoid buying canned, premade, or processed foods. These foods tend to be high in fat, sodium, and added sugar.  Shop around the outside edge of the grocery store. This includes fresh fruits and vegetables, bulk grains, fresh meats, and fresh dairy. Cooking  Use low-heat cooking methods, such as baking, instead of high-heat cooking methods like deep frying.  Cook using healthy oils, such as olive, canola, or sunflower oil.  Avoid cooking with butter, cream, or high-fat meats. Meal planning  Eat meals and snacks regularly, preferably at the same times every day. Avoid going long periods of time without eating.  Eat foods high in fiber, such as fresh fruits, vegetables, beans, and whole grains. Talk to your dietitian about how many servings of carbs you can eat at each meal.  Eat 4-6 ounces (oz) of lean protein each day, such as lean meat, chicken, fish, eggs, or tofu. One oz of lean protein is equal to: ? 1 oz of meat, chicken, or fish. ? 1 egg. ?  cup of tofu.  Eat some foods each day that contain healthy fats, such as avocado, nuts, seeds, and fish. Lifestyle  Check your blood glucose regularly.  Exercise regularly as told by your health care provider. This may include: ? 150 minutes of moderate-intensity or vigorous-intensity exercise each week. This could be brisk walking, biking,  or water aerobics. ? Stretching and doing strength exercises, such as yoga or weightlifting, at least 2 times a week.  Take medicines as told by your health care provider.  Do not use any products that contain nicotine or tobacco, such as cigarettes and e-cigarettes. If you need help quitting, ask your health care provider.  Work with a Social worker or diabetes educator to identify strategies to manage stress and any emotional and social challenges. Questions to ask a health care provider  Do I need to meet with a diabetes educator?  Do I need to meet with a dietitian?  What number can I call if I have questions?  When are  the best times to check my blood glucose? Where to find more information:  American Diabetes Association: diabetes.org  Academy of Nutrition and Dietetics: www.eatright.[employer] of Diabetes and Digestive and Kidney Diseases (NIH): [employer] Summary  A healthy meal plan will help you control your blood glucose and maintain a healthy lifestyle.  Working with a diet and nutrition specialist (dietitian) can help you make a meal plan that is best for you.  Keep in mind that carbohydrates (carbs) and alcohol have immediate effects on your blood glucose levels. It is important to count carbs and to use alcohol carefully. This information is not intended to replace advice given to you by your health care provider. Make sure you discuss any questions you have with your health care provider. Document Revised: 06/10/2017 Document Reviewed: 08/02/2016 Elsevier Patient Education  [employer].      If you have lab work done today you will be contacted with your lab results within the next 2 weeks.  If you have not heard from [place] then please contact us. The fastest way to get your results is to register for My Chart.   IF you received an x-ray today, you will receive an invoice from [facility]. Please contact [facility] at  [phone] with questions or concerns regarding your invoice.   IF you received labwork today, you will receive an invoice from [facility]. Please contact [facility] at [phone] with questions or concerns regarding your invoice.   Our billing staff will not be able to assist you with questions regarding bills from these companies.  You will be contacted with the lab results as soon as they are available. The fastest way to get your results is to activate your My Chart account. Instructions are located on the last page of this paperwork. If you have not heard from [place] regarding the results in 2 weeks, please contact this office.         Signed, Merri Ray, MD [facility]

## 2020-01-03 NOTE — Patient Instructions (Addendum)
Decrease glipizide to 5mg  dose to lessen risk of low readings in afternoon. Continue Januvia same dose. Continue to watch diet.  If blood sugars remaining in 200's, return sooner. If any return of low blood sugars, also return sooner. Continue to watch diet.  Check blood sugars once per day either fasting, 2 hours after meals, or if any symptoms of low blood sugar. recheck in 2 months. If more meds needed, I would want your kidney specialist involved with a new medicine called SGLT2.  Can discuss this at next visit.     Diabetes Mellitus and Nutrition, Adult When you have diabetes (diabetes mellitus), it is very important to have healthy eating habits because your blood sugar (glucose) levels are greatly affected by what you eat and drink. Eating healthy foods in the appropriate amounts, at about the same times every day, can help you:  Control your blood glucose.  Lower your risk of heart disease.  Improve your blood pressure.  Reach or maintain a healthy weight. Every person with diabetes is different, and each person has different needs for a meal plan. Your health care provider may recommend that you work with a diet and nutrition specialist (dietitian) to make a meal plan that is best for you. Your meal plan may vary depending on factors such as:  The calories you need.  The medicines you take.  Your weight.  Your blood glucose, blood pressure, and cholesterol levels.  Your activity level.  Other health conditions you have, such as heart or kidney disease. How do carbohydrates affect me? Carbohydrates, also called carbs, affect your blood glucose level more than any other type of food. Eating carbs naturally raises the amount of glucose in your blood. Carb counting is a method for keeping track of how many carbs you eat. Counting carbs is important to keep your blood glucose at a healthy level, especially if you use insulin or take certain oral diabetes medicines. It is important  to know how many carbs you can safely have in each meal. This is different for every person. Your dietitian can help you calculate how many carbs you should have at each meal and for each snack. Foods that contain carbs include:  Bread, cereal, rice, pasta, and crackers.  Potatoes and corn.  Peas, beans, and lentils.  Milk and yogurt.  Fruit and juice.  Desserts, such as cakes, cookies, ice cream, and candy. How does alcohol affect me? Alcohol can cause a sudden decrease in blood glucose (hypoglycemia), especially if you use insulin or take certain oral diabetes medicines. Hypoglycemia can be a life-threatening condition. Symptoms of hypoglycemia (sleepiness, dizziness, and confusion) are similar to symptoms of having too much alcohol. If your health care provider says that alcohol is safe for you, follow these guidelines:  Limit alcohol intake to no more than 1 drink per day for nonpregnant women and 2 drinks per day for men. One drink equals 12 oz of beer, 5 oz of wine, or 1 oz of hard liquor.  Do not drink on an empty stomach.  Keep yourself hydrated with water, diet soda, or unsweetened iced tea.  Keep in mind that regular soda, juice, and other mixers may contain a lot of sugar and must be counted as carbs. What are tips for following this plan?  Reading food labels  Start by checking the serving size on the "Nutrition Facts" label of packaged foods and drinks. The amount of calories, carbs, fats, and other nutrients listed on the label is  based on one serving of the item. Many items contain more than one serving per package.  Check the total grams (g) of carbs in one serving. You can calculate the number of servings of carbs in one serving by dividing the total carbs by 15. For example, if a food has 30 g of total carbs, it would be equal to 2 servings of carbs.  Check the number of grams (g) of saturated and trans fats in one serving. Choose foods that have low or no amount  of these fats.  Check the number of milligrams (mg) of salt (sodium) in one serving. Most people should limit total sodium intake to less than 2,300 mg per day.  Always check the nutrition information of foods labeled as "low-fat" or "nonfat". These foods may be higher in added sugar or refined carbs and should be avoided.  Talk to your dietitian to identify your daily goals for nutrients listed on the label. Shopping  Avoid buying canned, premade, or processed foods. These foods tend to be high in fat, sodium, and added sugar.  Shop around the outside edge of the grocery store. This includes fresh fruits and vegetables, bulk grains, fresh meats, and fresh dairy. Cooking  Use low-heat cooking methods, such as baking, instead of high-heat cooking methods like deep frying.  Cook using healthy oils, such as olive, canola, or sunflower oil.  Avoid cooking with butter, cream, or high-fat meats. Meal planning  Eat meals and snacks regularly, preferably at the same times every day. Avoid going long periods of time without eating.  Eat foods high in fiber, such as fresh fruits, vegetables, beans, and whole grains. Talk to your dietitian about how many servings of carbs you can eat at each meal.  Eat 4-6 ounces (oz) of lean protein each day, such as lean meat, chicken, fish, eggs, or tofu. One oz of lean protein is equal to: ? 1 oz of meat, chicken, or fish. ? 1 egg. ?  cup of tofu.  Eat some foods each day that contain healthy fats, such as avocado, nuts, seeds, and fish. Lifestyle  Check your blood glucose regularly.  Exercise regularly as told by your health care provider. This may include: ? 150 minutes of moderate-intensity or vigorous-intensity exercise each week. This could be brisk walking, biking, or water aerobics. ? Stretching and doing strength exercises, such as yoga or weightlifting, at least 2 times a week.  Take medicines as told by your health care provider.  Do not  use any products that contain nicotine or tobacco, such as cigarettes and e-cigarettes. If you need help quitting, ask your health care provider.  Work with a Social worker or diabetes educator to identify strategies to manage stress and any emotional and social challenges. Questions to ask a health care provider  Do I need to meet with a diabetes educator?  Do I need to meet with a dietitian?  What number can I call if I have questions?  When are the best times to check my blood glucose? Where to find more information:  American Diabetes Association: diabetes.org  Academy of Nutrition and Dietetics: www.eatright.[employer] of Diabetes and Digestive and Kidney Diseases (NIH): [employer] Summary  A healthy meal plan will help you control your blood glucose and maintain a healthy lifestyle.  Working with a diet and nutrition specialist (dietitian) can help you make a meal plan that is best for you.  Keep in mind that carbohydrates (carbs) and alcohol have immediate effects  on your blood glucose levels. It is important to count carbs and to use alcohol carefully. This information is not intended to replace advice given to you by your health care provider. Make sure you discuss any questions you have with your health care provider. Document Revised: 06/10/2017 Document Reviewed: 08/02/2016 Elsevier Patient Education  [employer].      If you have lab work done today you will be contacted with your lab results within the next 2 weeks.  If you have not heard from [place] then please contact us. The fastest way to get your results is to register for My Chart.   IF you received an x-ray today, you will receive an invoice from [facility]. Please contact [facility] at [phone] with questions or concerns regarding your invoice.   IF you received labwork today, you will receive an invoice from [place]. Please contact [facility] at [phone] with  questions or concerns regarding your invoice.   Our billing staff will not be able to assist you with questions regarding bills from these companies.  You will be contacted with the lab results as soon as they are available. The fastest way to get your results is to activate your My Chart account. Instructions are located on the last page of this paperwork. If you have not heard from [place] regarding the results in 2 weeks, please contact this office.

## 2020-01-04 ENCOUNTER — Ambulatory Visit (HOSPITAL_COMMUNITY)
Admission: RE | Admit: 2020-01-04 | Discharge: 2020-01-04 | Disposition: A | Discharge status: Home / Self Care | Payer: Medicare Other | Source: Ambulatory Visit | Attending: Nephrology | Admitting: Nephrology

## 2020-01-04 ENCOUNTER — Ambulatory Visit: Payer: Medicare Other | Admitting: Family Medicine

## 2020-01-04 VITALS — BP 144/81 | HR 77 | Resp 20

## 2020-01-04 DIAGNOSIS — N184 Chronic kidney disease, stage 4 (severe): Secondary | ICD-10-CM | POA: Diagnosis not present

## 2020-01-04 DIAGNOSIS — N179 Acute kidney failure, unspecified: Secondary | ICD-10-CM

## 2020-01-04 DIAGNOSIS — D631 Anemia in chronic kidney disease: Secondary | ICD-10-CM | POA: Insufficient documentation

## 2020-01-04 LAB — POCT HEMOGLOBIN-HEMACUE: Hemoglobin: 11 g/dL — ABNORMAL LOW (ref 13.0–17.0)

## 2020-01-04 LAB — IRON AND TIBC
Iron: 53 ug/dL (ref 45–182)
Saturation Ratios: 19 % (ref 17.9–39.5)
TIBC: 280 ug/dL (ref 250–450)
UIBC: 227 ug/dL

## 2020-01-04 LAB — FERRITIN: Ferritin: 831 ng/mL — ABNORMAL HIGH (ref 24–336)

## 2020-01-04 LAB — TSH: TSH: 5.42 u[IU]/mL — ABNORMAL HIGH (ref 0.450–4.500)

## 2020-01-04 MED ORDER — EPOETIN ALFA-EPBX 10000 UNIT/ML IJ SOLN
INTRAMUSCULAR | Status: AC
  Filled 2020-01-04: qty 1

## 2020-01-04 MED ORDER — EPOETIN ALFA-EPBX 10000 UNIT/ML IJ SOLN
10000.0000 [IU] | INTRAMUSCULAR | Status: DC
  Administered 2020-01-04: 10000 [IU] via SUBCUTANEOUS

## 2020-01-07 ENCOUNTER — Other Ambulatory Visit: Payer: Self-pay

## 2020-01-07 ENCOUNTER — Ambulatory Visit (INDEPENDENT_AMBULATORY_CARE_PROVIDER_SITE_OTHER): Payer: Medicare Other | Admitting: Family Medicine

## 2020-01-07 DIAGNOSIS — Z111 Encounter for screening for respiratory tuberculosis: Secondary | ICD-10-CM | POA: Diagnosis not present

## 2020-01-08 ENCOUNTER — Other Ambulatory Visit: Payer: Self-pay | Admitting: Family Medicine

## 2020-01-08 ENCOUNTER — Encounter: Payer: Self-pay | Admitting: Family Medicine

## 2020-01-08 DIAGNOSIS — E785 Hyperlipidemia, unspecified: Secondary | ICD-10-CM

## 2020-01-08 DIAGNOSIS — F32A Depression, unspecified: Secondary | ICD-10-CM

## 2020-01-08 DIAGNOSIS — E118 Type 2 diabetes mellitus with unspecified complications: Secondary | ICD-10-CM

## 2020-01-08 DIAGNOSIS — N401 Enlarged prostate with lower urinary tract symptoms: Secondary | ICD-10-CM

## 2020-01-08 NOTE — Telephone Encounter (Signed)
Requested Prescriptions  Pending Prescriptions Disp Refills  . citalopram (CELEXA) 20 MG tablet [Pharmacy Med Name: CITALOPRAM 20MG  TABLETS] 90 tablet 1    Sig: TAKE 1 TABLET(20 MG) BY MOUTH DAILY     Psychiatry:  Antidepressants - SSRI Passed - 01/08/2020  6:29 AM      Passed - Completed PHQ-2 or PHQ-9 in the last 360 days.      Passed - Valid encounter within last 6 months    Recent Outpatient Visits          Yesterday Visit for TB skin test   [facility] [place], Ranell Patrick, MD   5 days ago Type 2 diabetes mellitus with chronic kidney disease, without long-term current use of insulin, unspecified CKD stage [facility])   [facility] [place], Ranell Patrick, MD   1 month ago Type 2 diabetes mellitus with chronic kidney disease, without long-term current use of insulin, unspecified CKD stage [facility])   [facility] [place], Ranell Patrick, MD   1 month ago Type 2 diabetes mellitus with chronic kidney disease, without long-term current use of insulin, unspecified CKD stage [facility])   [facility] [place], Ranell Patrick, MD   3 months ago Congestive heart failure, unspecified HF chronicity, unspecified heart failure type [facility])   [facility] [place], Ranell Patrick, MD      Future Appointments            In 1 month [name] Ranell Patrick, MD Primary Care at [facility], [facility]           . tamsulosin (FLOMAX) 0.4 MG CAPS capsule [Pharmacy Med Name: TAMSULOSIN 0.4MG  CAPSULES] 180 capsule 1    Sig: TAKE 2 CAPSULES(0.8 MG) BY MOUTH DAILY     Urology: Alpha-Adrenergic Blocker Failed - 01/08/2020  6:29 AM      Failed - Last BP in normal range    BP Readings from Last 1 Encounters:  01/04/20 (!) 144/81         Passed - Valid encounter within last 12 months    Recent Outpatient Visits          Yesterday Visit for TB skin test   [facility] [place], Ranell Patrick, MD   5 days ago Type 2 diabetes mellitus with chronic kidney disease, without long-term current use of insulin,  unspecified CKD stage [facility])   [facility] [place], Ranell Patrick, MD   1 month ago Type 2 diabetes mellitus with chronic kidney disease, without long-term current use of insulin, unspecified CKD stage [facility])   [facility] [place], Ranell Patrick, MD   1 month ago Type 2 diabetes mellitus with chronic kidney disease, without long-term current use of insulin, unspecified CKD stage [facility])   [facility] [place], Ranell Patrick, MD   3 months ago Congestive heart failure, unspecified HF chronicity, unspecified heart failure type [facility])   [facility] at [place], Ranell Patrick, MD      Future Appointments            In 1 month [name] Ranell Patrick, MD Primary Care at [place], [facility]           . atorvastatin (LIPITOR) 80 MG tablet [Pharmacy Med Name: ATORVASTATIN 80MG  TABLETS] 90 tablet 1    Sig: TAKE 1 TABLET(80 MG) BY MOUTH DAILY     Cardiovascular:  Antilipid - Statins Failed - 01/08/2020  6:29 AM      Failed -  LDL in normal range and within 360 days    LDL Chol Calc (NIH)  Date Value Ref Range Status  07/20/2019 57 0 - 99 mg/dL Final         Passed - Total Cholesterol in normal range and within 360 days    Cholesterol, Total  Date Value Ref Range Status  07/20/2019 159 100 - 199 mg/dL Final         Passed - HDL in normal range and within 360 days    HDL  Date Value Ref Range Status  07/20/2019 80 >39 mg/dL Final         Passed - Triglycerides in normal range and within 360 days    Triglycerides  Date Value Ref Range Status  07/20/2019 127 0 - 149 mg/dL Final         Passed - Patient is not pregnant      Passed - Valid encounter within last 12 months    Recent Outpatient Visits          Yesterday Visit for TB skin test   [facility] [place], Ranell Patrick, MD   5 days ago Type 2 diabetes mellitus with chronic kidney disease, without long-term current use of insulin, unspecified CKD stage [facility])   [facility] [place], Ranell Patrick, MD   1 month  ago Type 2 diabetes mellitus with chronic kidney disease, without long-term current use of insulin, unspecified CKD stage [facility])   [facility] at [place], Ranell Patrick, MD   1 month ago Type 2 diabetes mellitus with chronic kidney disease, without long-term current use of insulin, unspecified CKD stage [facility])   [facility] [place], Ranell Patrick, MD   3 months ago Congestive heart failure, unspecified HF chronicity, unspecified heart failure type [facility])   [facility] [place], Ranell Patrick, MD      Future Appointments            In 1 month [name] Ranell Patrick, MD Primary Care at [place], [facility]           . glipiZIDE (GLUCOTROL XL) 10 MG 24 hr tablet [Pharmacy Med Name: GLIPIZIDE ER 10MG  TABLETS] 90 tablet 1    Sig: TAKE 1 TABLET(10 MG) BY MOUTH DAILY WITH BREAKFAST     Endocrinology:  Diabetes - Sulfonylureas Passed - 01/08/2020  6:29 AM      Passed - HBA1C is between 0 and 7.9 and within 180 days    Hgb A1c MFr Bld  Date Value Ref Range Status  11/30/2019 6.9 (H) 4.8 - 5.6 % Final    Comment:             Prediabetes: 5.7 - 6.4          Diabetes: >6.4          Glycemic control for adults with diabetes: <7.0          Passed - Valid encounter within last 6 months    Recent Outpatient Visits          Yesterday Visit for TB skin test   [facility] [place], Ranell Patrick, MD   5 days ago Type 2 diabetes mellitus with chronic kidney disease, without long-term current use of insulin, unspecified CKD stage [facility])   [facility] [place], Ranell Patrick, MD   1 month ago Type 2 diabetes mellitus with chronic kidney disease, without long-term current use of insulin, unspecified CKD stage ([place])  [facility] at [place], MD   1 month ago Type 2 diabetes mellitus with chronic kidney disease, without long-term current use of insulin, unspecified CKD stage [facility])   [facility] at [place], Ranell Patrick, MD   3 months ago Congestive heart failure,  unspecified HF chronicity, unspecified heart failure type [facility])   [facility] at [place], Ranell Patrick, MD      Future Appointments            In 1 month [name] Ranell Patrick, MD Primary Care at [place], [facility]

## 2020-01-09 ENCOUNTER — Other Ambulatory Visit: Payer: Self-pay

## 2020-01-09 ENCOUNTER — Ambulatory Visit (INDEPENDENT_AMBULATORY_CARE_PROVIDER_SITE_OTHER): Payer: Medicare Other | Admitting: Family Medicine

## 2020-01-09 DIAGNOSIS — Z111 Encounter for screening for respiratory tuberculosis: Secondary | ICD-10-CM

## 2020-01-09 LAB — TB SKIN TEST
Induration: 0 mm
TB Skin Test: NEGATIVE

## 2020-01-09 NOTE — Progress Notes (Signed)
Pt was seen for TB skin test read today negative with 39mm induration. Okay per [name]

## 2020-01-09 NOTE — Telephone Encounter (Signed)
See result note.  

## 2020-01-29 ENCOUNTER — Other Ambulatory Visit: Payer: Self-pay | Admitting: Family Medicine

## 2020-01-29 DIAGNOSIS — E1122 Type 2 diabetes mellitus with diabetic chronic kidney disease: Secondary | ICD-10-CM

## 2020-01-29 NOTE — Telephone Encounter (Signed)
Requested Prescriptions  Pending Prescriptions Disp Refills  . JANUVIA 25 MG tablet [Pharmacy Med Name: JANUVIA 25MG  TABLETS] 90 tablet 0    Sig: TAKE 1 TABLET(25 MG) BY MOUTH DAILY     Endocrinology:  Diabetes - DPP-4 Inhibitors Failed - 01/29/2020  4:30 PM      Failed - Cr in normal range and within 360 days    Creat  Date Value Ref Range Status  03/26/2016 2.68 (H) 0.70 - 1.25 mg/dL Final    Comment:      For patients > or = 73 years of age: The upper reference limit for Creatinine is approximately 13% higher for people identified as African-American.      Creatinine, Ser  Date Value Ref Range Status  11/05/2019 2.52 (H) 0.61 - 1.24 mg/dL Final         Passed - HBA1C is between 0 and 7.9 and within 180 days    Hgb A1c MFr Bld  Date Value Ref Range Status  11/30/2019 6.9 (H) 4.8 - 5.6 % Final    Comment:             Prediabetes: 5.7 - 6.4          Diabetes: >6.4          Glycemic control for adults with diabetes: <7.0          Passed - Valid encounter within last 6 months    Recent Outpatient Visits          2 weeks ago Encounter for PPD skin test reading   [facility] [place], Ranell Patrick, MD   3 weeks ago Visit for TB skin test   [facility] [place], Ranell Patrick, MD   3 weeks ago Type 2 diabetes mellitus with chronic kidney disease, without long-term current use of insulin, unspecified CKD stage [facility])   [facility] [place], Ranell Patrick, MD   1 month ago Type 2 diabetes mellitus with chronic kidney disease, without long-term current use of insulin, unspecified CKD stage [facility])   [facility] at [place], Ranell Patrick, MD   2 months ago Type 2 diabetes mellitus with chronic kidney disease, without long-term current use of insulin, unspecified CKD stage [facility])   [facility] at [place], Ranell Patrick, MD      Future Appointments            In 1 month [name] Ranell Patrick, MD Primary Care at [place], [facility]

## 2020-02-01 ENCOUNTER — Other Ambulatory Visit: Payer: Self-pay

## 2020-02-01 ENCOUNTER — Ambulatory Visit (HOSPITAL_COMMUNITY)
Admission: RE | Admit: 2020-02-01 | Discharge: 2020-02-01 | Disposition: A | Discharge status: Home / Self Care | Payer: Medicare Other | Source: Ambulatory Visit | Attending: Nephrology | Admitting: Nephrology

## 2020-02-01 VITALS — BP 134/69 | HR 79 | Temp 97.8°F | Resp 20

## 2020-02-01 DIAGNOSIS — N179 Acute kidney failure, unspecified: Secondary | ICD-10-CM | POA: Insufficient documentation

## 2020-02-01 LAB — FERRITIN: Ferritin: 658 ng/mL — ABNORMAL HIGH (ref 24–336)

## 2020-02-01 LAB — IRON AND TIBC
Iron: 56 ug/dL (ref 45–182)
Saturation Ratios: 20 % (ref 17.9–39.5)
TIBC: 280 ug/dL (ref 250–450)
UIBC: 224 ug/dL

## 2020-02-01 LAB — POCT HEMOGLOBIN-HEMACUE: Hemoglobin: 11.6 g/dL — ABNORMAL LOW (ref 13.0–17.0)

## 2020-02-01 MED ORDER — EPOETIN ALFA-EPBX 10000 UNIT/ML IJ SOLN
INTRAMUSCULAR | Status: AC
  Filled 2020-02-01: qty 1

## 2020-02-01 MED ORDER — EPOETIN ALFA-EPBX 10000 UNIT/ML IJ SOLN
10000.0000 [IU] | INTRAMUSCULAR | Status: DC
  Administered 2020-02-01: 10000 [IU] via SUBCUTANEOUS

## 2020-02-29 ENCOUNTER — Other Ambulatory Visit: Payer: Self-pay

## 2020-02-29 ENCOUNTER — Ambulatory Visit (HOSPITAL_COMMUNITY)
Admission: RE | Admit: 2020-02-29 | Discharge: 2020-02-29 | Disposition: A | Discharge status: Home / Self Care | Payer: Medicare Other | Source: Ambulatory Visit | Attending: Nephrology | Admitting: Nephrology

## 2020-02-29 VITALS — BP 97/61 | HR 81 | Temp 98.3°F | Resp 20

## 2020-02-29 DIAGNOSIS — D631 Anemia in chronic kidney disease: Secondary | ICD-10-CM | POA: Diagnosis not present

## 2020-02-29 DIAGNOSIS — N179 Acute kidney failure, unspecified: Secondary | ICD-10-CM

## 2020-02-29 DIAGNOSIS — N184 Chronic kidney disease, stage 4 (severe): Secondary | ICD-10-CM | POA: Diagnosis present

## 2020-02-29 LAB — FERRITIN: Ferritin: 475 ng/mL — ABNORMAL HIGH (ref 24–336)

## 2020-02-29 LAB — IRON AND TIBC
Iron: 58 ug/dL (ref 45–182)
Saturation Ratios: 23 % (ref 17.9–39.5)
TIBC: 255 ug/dL (ref 250–450)
UIBC: 197 ug/dL

## 2020-02-29 LAB — POCT HEMOGLOBIN-HEMACUE: Hemoglobin: 12.2 g/dL — ABNORMAL LOW (ref 13.0–17.0)

## 2020-02-29 MED ORDER — EPOETIN ALFA-EPBX 10000 UNIT/ML IJ SOLN
INTRAMUSCULAR | Status: AC
  Filled 2020-02-29: qty 1

## 2020-02-29 MED ORDER — EPOETIN ALFA-EPBX 10000 UNIT/ML IJ SOLN: 10000.0000 [IU] | INTRAMUSCULAR | Status: DC

## 2020-03-03 ENCOUNTER — Encounter (HOSPITAL_COMMUNITY): Payer: Medicare Other | Admitting: Cardiology

## 2020-03-04 ENCOUNTER — Telehealth (HOSPITAL_COMMUNITY): Payer: Self-pay | Admitting: Vascular Surgery

## 2020-03-04 NOTE — Telephone Encounter (Signed)
Returned pt wife's call to reschedule 8/27 appt w/ DM

## 2020-03-07 ENCOUNTER — Ambulatory Visit (INDEPENDENT_AMBULATORY_CARE_PROVIDER_SITE_OTHER): Payer: Medicare Other | Admitting: Family Medicine

## 2020-03-07 ENCOUNTER — Ambulatory Visit (HOSPITAL_COMMUNITY)
Admission: RE | Admit: 2020-03-07 | Discharge: 2020-03-07 | Disposition: A | Discharge status: Home / Self Care | Payer: Medicare Other | Source: Ambulatory Visit | Attending: Internal Medicine | Admitting: Internal Medicine

## 2020-03-07 ENCOUNTER — Encounter (HOSPITAL_COMMUNITY): Payer: Self-pay | Admitting: Cardiology

## 2020-03-07 ENCOUNTER — Encounter: Payer: Self-pay | Admitting: Family Medicine

## 2020-03-07 ENCOUNTER — Other Ambulatory Visit: Payer: Self-pay

## 2020-03-07 VITALS — BP 120/58 | HR 70 | Wt 134.8 lb

## 2020-03-07 VITALS — BP 131/74 | HR 72 | Temp 98.0°F | Ht 66.0 in | Wt 135.2 lb

## 2020-03-07 DIAGNOSIS — E1122 Type 2 diabetes mellitus with diabetic chronic kidney disease: Secondary | ICD-10-CM

## 2020-03-07 DIAGNOSIS — I2510 Atherosclerotic heart disease of native coronary artery without angina pectoris: Secondary | ICD-10-CM | POA: Diagnosis not present

## 2020-03-07 DIAGNOSIS — I1310 Hypertensive heart and chronic kidney disease without heart failure, with stage 1 through stage 4 chronic kidney disease, or unspecified chronic kidney disease: Secondary | ICD-10-CM | POA: Diagnosis not present

## 2020-03-07 DIAGNOSIS — E119 Type 2 diabetes mellitus without complications: Secondary | ICD-10-CM | POA: Insufficient documentation

## 2020-03-07 DIAGNOSIS — F32A Depression, unspecified: Secondary | ICD-10-CM

## 2020-03-07 DIAGNOSIS — R7989 Other specified abnormal findings of blood chemistry: Secondary | ICD-10-CM | POA: Diagnosis not present

## 2020-03-07 DIAGNOSIS — I4892 Unspecified atrial flutter: Secondary | ICD-10-CM | POA: Diagnosis not present

## 2020-03-07 DIAGNOSIS — N184 Chronic kidney disease, stage 4 (severe): Secondary | ICD-10-CM

## 2020-03-07 DIAGNOSIS — I130 Hypertensive heart and chronic kidney disease with heart failure and stage 1 through stage 4 chronic kidney disease, or unspecified chronic kidney disease: Secondary | ICD-10-CM | POA: Diagnosis not present

## 2020-03-07 DIAGNOSIS — E079 Disorder of thyroid, unspecified: Secondary | ICD-10-CM | POA: Insufficient documentation

## 2020-03-07 DIAGNOSIS — E785 Hyperlipidemia, unspecified: Secondary | ICD-10-CM | POA: Insufficient documentation

## 2020-03-07 DIAGNOSIS — Z7984 Long term (current) use of oral hypoglycemic drugs: Secondary | ICD-10-CM | POA: Insufficient documentation

## 2020-03-07 DIAGNOSIS — Z79899 Other long term (current) drug therapy: Secondary | ICD-10-CM | POA: Insufficient documentation

## 2020-03-07 DIAGNOSIS — I480 Paroxysmal atrial fibrillation: Secondary | ICD-10-CM | POA: Insufficient documentation

## 2020-03-07 DIAGNOSIS — F17210 Nicotine dependence, cigarettes, uncomplicated: Secondary | ICD-10-CM | POA: Insufficient documentation

## 2020-03-07 DIAGNOSIS — W19XXXA Unspecified fall, initial encounter: Secondary | ICD-10-CM

## 2020-03-07 DIAGNOSIS — F329 Major depressive disorder, single episode, unspecified: Secondary | ICD-10-CM | POA: Insufficient documentation

## 2020-03-07 DIAGNOSIS — Z8249 Family history of ischemic heart disease and other diseases of the circulatory system: Secondary | ICD-10-CM | POA: Insufficient documentation

## 2020-03-07 DIAGNOSIS — I5032 Chronic diastolic (congestive) heart failure: Secondary | ICD-10-CM

## 2020-03-07 DIAGNOSIS — I10 Essential (primary) hypertension: Secondary | ICD-10-CM

## 2020-03-07 DIAGNOSIS — S0001XA Abrasion of scalp, initial encounter: Secondary | ICD-10-CM

## 2020-03-07 DIAGNOSIS — I5022 Chronic systolic (congestive) heart failure: Secondary | ICD-10-CM

## 2020-03-07 DIAGNOSIS — Z7901 Long term (current) use of anticoagulants: Secondary | ICD-10-CM | POA: Diagnosis not present

## 2020-03-07 DIAGNOSIS — I471 Supraventricular tachycardia: Secondary | ICD-10-CM | POA: Insufficient documentation

## 2020-03-07 DIAGNOSIS — Y92009 Unspecified place in unspecified non-institutional (private) residence as the place of occurrence of the external cause: Secondary | ICD-10-CM

## 2020-03-07 LAB — CBC
HCT: 37.8 % — ABNORMAL LOW (ref 39.0–52.0)
Hemoglobin: 11.9 g/dL — ABNORMAL LOW (ref 13.0–17.0)
MCH: 26.4 pg (ref 26.0–34.0)
MCHC: 31.5 g/dL (ref 30.0–36.0)
MCV: 83.8 fL (ref 80.0–100.0)
Platelets: 349 10*3/uL (ref 150–400)
RBC: 4.51 MIL/uL (ref 4.22–5.81)
RDW: 18.1 % — ABNORMAL HIGH (ref 11.5–15.5)
WBC: 10.5 10*3/uL (ref 4.0–10.5)
nRBC: 0 % (ref 0.0–0.2)

## 2020-03-07 LAB — COMPREHENSIVE METABOLIC PANEL
ALT: 21 U/L (ref 0–44)
AST: 17 U/L (ref 15–41)
Albumin: 3.6 g/dL (ref 3.5–5.0)
Alkaline Phosphatase: 104 U/L (ref 38–126)
Anion gap: 12 (ref 5–15)
BUN: 60 mg/dL — ABNORMAL HIGH (ref 8–23)
CO2: 23 mmol/L (ref 22–32)
Calcium: 9.4 mg/dL (ref 8.9–10.3)
Chloride: 102 mmol/L (ref 98–111)
Creatinine, Ser: 3.33 mg/dL — ABNORMAL HIGH (ref 0.61–1.24)
GFR calc Af Amer: 20 mL/min — ABNORMAL LOW (ref >60)
GFR calc non Af Amer: 17 mL/min — ABNORMAL LOW (ref >60)
Glucose, Bld: 167 mg/dL — ABNORMAL HIGH (ref 70–99)
Potassium: 4 mmol/L (ref 3.5–5.1)
Sodium: 137 mmol/L (ref 135–145)
Total Bilirubin: 0.3 mg/dL (ref 0.3–1.2)
Total Protein: 8.1 g/dL (ref 6.5–8.1)

## 2020-03-07 LAB — T4, FREE: Free T4: 0.96 ng/dL (ref 0.61–1.12)

## 2020-03-07 LAB — TSH: TSH: 4.686 u[IU]/mL — ABNORMAL HIGH (ref 0.350–4.500)

## 2020-03-07 LAB — GLUCOSE, POCT (MANUAL RESULT ENTRY): POC Glucose: 148 mg/dl — AB (ref 70–99)

## 2020-03-07 MED ORDER — TORSEMIDE 20 MG PO TABS: 20.0000 mg | ORAL_TABLET | ORAL | 2 refills | Status: DC | PRN

## 2020-03-07 MED ORDER — APIXABAN 2.5 MG PO TABS: 2.5000 mg | ORAL_TABLET | Freq: Two times a day (BID) | ORAL | 6 refills | Status: DC

## 2020-03-07 MED ORDER — DAPAGLIFLOZIN PROPANEDIOL 5 MG PO TABS: 5.0000 mg | ORAL_TABLET | Freq: Every day | ORAL | 5 refills | Status: DC

## 2020-03-07 NOTE — Progress Notes (Signed)
 [facility] PROGRESS NOTE  Referring Physician: Primary Care: Jeffrey Greene MD ([place]) Primary Cardiologist: Acharya AHFC: Dr. Bensimhon   HPI: Adam Reid is a 73 y.o. male from [place] with a h/o a tobacco abuse (quit 2021), HTN, DM2, CKD 4 (Cr ~3.0), diastolic HF, PAF and iron-deficiency anemia, recently referred by Dr. Acharya for further evaluation of HF and restrictive CM. Had initial consultation w/ Dr. Bensimhon on 10/15/19.   Had LHC 01/2016 for CP evaluation revealing mild nonobstructive CAD, LAD 20%, D2 50%, OM 20%  Had COVID in 2019. Not too many symptoms except for fatigue.   Hospitalized 3/14-17/21 for diastolic HF. Diuresed and weight down to 129 pounds. Sent home on lasix 60 daily. Echo LVEF 55-60%, RV ok. Restrictive filling pattern. There was also concern about cardiac amyloidosis given restrictive filling pattern on echo.  Multiple myeloma panel obtained and immunofixation did not demonstrate monoclonal protein spike. PYP completed and negative for TTR amyloid.   Zio 6/21 1. Sinus rhythm predominates - avg HR of 80 bpm. 2. Two runs NSVT - the longest lasting 6 beats with an avg rate of 124 bpm. 3. 99 Supraventricular Tachycardia/atrial tach runs occurred the longest lasting 45.5 secs with an avg rate of 105 bpm. 4. No evidence of atrial fib 5. Rare PVCs and PACs.  6. Several patient-triggered events associated with sinus rhythm    He presents back to clinic for f/u. Here with his wife who serves as a translator. Feels good. Walking 30 mins/day. Now in NSR on low-dose amio. Hgb better with iron infusions and aranesp.  Denies edema, orthopnea or PND. Only taking Eliquis 2.5 daily. No bleeding.    Past Medical History:  Diagnosis Date  . Anemia   . CHF (congestive heart failure) (HCC)   . Chronic kidney disease   . Depression   . Diabetes mellitus without complication (HCC)   . Hyperlipidemia   . Hypertension   . Thyroid disease    was on supplement,  taken off by Dr Guest    Current Outpatient Medications  Medication Sig Dispense Refill  . amiodarone (PACERONE) 200 MG tablet Take 1 tablet (200 mg total) by mouth daily. 30 tablet 6  . apixaban (ELIQUIS) 2.5 MG TABS tablet Take 2.5 mg by mouth daily.    . atorvastatin (LIPITOR) 80 MG tablet TAKE 1 TABLET(80 MG) BY MOUTH DAILY 90 tablet 1  . blood glucose meter kit and supplies Use up to two times daily as directed  ICD10 E10.9 E11.9 1 each 0  . Blood Glucose Monitoring Suppl (ONETOUCH VERIO) w/Device KIT as directed.   0  . citalopram (CELEXA) 20 MG tablet TAKE 1 TABLET(20 MG) BY MOUTH DAILY 90 tablet 1  . cyanocobalamin 500 MCG tablet Take 500 mcg by mouth daily. Reported on 12/02/2015    . diltiazem (CARDIZEM CD) 180 MG 24 hr capsule TAKE 1 CAPSULE(180 MG) BY MOUTH DAILY 30 capsule 2  . glipiZIDE (GLUCOTROL XL) 5 MG 24 hr tablet Take 1 tablet (5 mg total) by mouth daily with breakfast. 30 tablet 1  . GlucoCom Lancets MISC Use for home glucose monitoring 100 each 3  . glucose blood (ONETOUCH VERIO) test strip Test up to 2 times per day.  Uncontrolled diabetes with hyperglycemia and stage 4 CKD. 100 each 3  . Omega 3-6-9 Fatty Acids (OMEGA 3-6-9 COMPLEX PO) Take 1 tablet by mouth daily.    . pantoprazole (PROTONIX) 40 MG tablet Take 1 tablet (40 mg total) by mouth   daily. 30 tablet 11  . polyethylene glycol (MIRALAX / GLYCOLAX) 17 g packet Take 17 g by mouth daily as needed for mild constipation or moderate constipation.     . senna (SENOKOT) 8.6 MG tablet Take 1 tablet by mouth as needed for constipation.     . tamsulosin (FLOMAX) 0.4 MG CAPS capsule TAKE 2 CAPSULES(0.8 MG) BY MOUTH DAILY 180 capsule 3  . torsemide (DEMADEX) 20 MG tablet Take 20 mg by mouth daily.    . JANUVIA 25 MG tablet TAKE 1 TABLET(25 MG) BY MOUTH DAILY (Patient not taking: Reported on 03/07/2020) 90 tablet 0   No current facility-administered medications for this encounter.    No Known Allergies    Social History    Socioeconomic History  . Marital status: Married    Spouse name: Adam Reid  . Number of children: 1  . Years of education: 12th grade  . Highest education level: Not on file  Occupational History  . Occupation: Retired-accountant  Tobacco Use  . Smoking status: Light Tobacco Smoker    Packs/day: 0.10    Years: 48.00    Pack years: 4.80    Types: Cigarettes    Last attempt to quit: 07/16/2019    Years since quitting: 0.6  . Smokeless tobacco: Never Used  . Tobacco comment: per pt he has one cigarette every couple days  Vaping Use  . Vaping Use: Never used  Substance and Sexual Activity  . Alcohol use: No    Alcohol/week: 0.0 standard drinks  . Drug use: No  . Sexual activity: Never    Partners: Female  Other Topics Concern  . Not on file  Social History Narrative   Originally from [place]. Came to the [place] in 2009, following their son who came here for school.   Married.   Lives with his wife.   Their adult son lives in [place], [place], where he is in optometry school.   Education: High School   Exercise: No   Social Determinants of Health   Financial Resource Strain:   . Difficulty of Paying Living Expenses: Not on file  Food Insecurity:   . Worried About Running Out of Food in the Last Year: Not on file  . Ran Out of Food in the Last Year: Not on file  Transportation Needs:   . Lack of Transportation (Medical): Not on file  . Lack of Transportation (Non-Medical): Not on file  Physical Activity:   . Days of Exercise per Week: Not on file  . Minutes of Exercise per Session: Not on file  Stress:   . Feeling of Stress : Not on file  Social Connections:   . Frequency of Communication with Friends and Family: Not on file  . Frequency of Social Gatherings with Friends and Family: Not on file  . Attends Religious Services: Not on file  . Active Member of Clubs or Organizations: Not on file  . Attends Club or Organization Meetings: Not on file  . Marital Status: Not on file    Intimate Partner Violence:   . Fear of Current or Ex-Partner: Not on file  . Emotionally Abused: Not on file  . Physically Abused: Not on file  . Sexually Abused: Not on file      Family History  Problem Relation Age of Onset  . Hypertension Mother   . Hyperlipidemia Mother   . Hypertension Sister   . Kidney disease Father   . Heart disease Brother 65         open heart surgery  . Colon cancer Neg Hx     Vitals:   03/07/20 0911  BP: (!) 120/58  Pulse: 70  SpO2: 98%  Weight: 61.1 kg (134 lb 12.8 oz)   Wt Readings from Last 3 Encounters:  03/07/20 61.1 kg (134 lb 12.8 oz)  01/03/20 59.9 kg (132 lb)  12/25/19 60.3 kg (133 lb)    General:  Thin. Well appearing. No resp difficulty HEENT: normal Neck: supple. no JVD. Carotids 2+ bilat; no bruits. No lymphadenopathy or thryomegaly appreciated. Cor: PMI nondisplaced. Regular rate & rhythm. No rubs, gallops or murmurs. Lungs: clear Abdomen: soft, nontender, nondistended. No hepatosplenomegaly. No bruits or masses. Good bowel sounds. Extremities: no cyanosis, clubbing, rash, edema Neuro: alert & orientedx3, cranial nerves grossly intact. moves all 4 extremities w/o difficulty. Affect pleasant  ASSESSMENT & PLAN:  1.Chronic diastolic HF - Echo 3/21: EF 55-60% with restrictive filling pattern - PYP scan 4/21 negative for TTR amyloid  - based on history, ECG, echo findings (normal RV) and concomitant renal failure, suspect this is due to longstanding HTN +/- DM2 changes  - Much improved with maintenance of NSR. - NYHA II. Volume status ok.  - I spoke with renal. Will start Farxiga 5mg daily. Change torsemide 20mg daily to 20mg prn.   2. 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 HTN - Blood pressure well controlled. Continue current regimen.   4. CKD Stage IV  - due to HTN and DM2 -  Followed by Dr. Jay Patel  - last creatinine 2.8 (GFR 22)  - I spoke with Dr. Patel as above. Start Farixga  5. DM2 - per PCP - start Farxiga  6. IDA - Nephrology following and treating w/ IV Fe infusions and aranesp - Had EGD/colon in 5/21 - benign - tolerating Eliquis 2.5 but only taking once a day (wt 61kg cr 2.5) - No bleeding issues.   7. Non-obstructive CAD - continue statin   Daniel Bensimhon, MD  10:11 AM    

## 2020-03-07 NOTE — Patient Instructions (Addendum)
START Farxiga 5mg  (1 tab) daily  TAKE Torsemide ONLY AS NEEDED  INCREASE Eliquis to 2.5mg  (1 tab) twice a day  Labs today and repeat in 2 weeks We will only contact you if something comes back abnormal or we need to make some changes. Otherwise no news is good news!  Marland KitchenYour physician recommends that you schedule a follow-up appointment in: 6 months. You will get a call to schedule this appointment.   At the [facility], you and your health needs are our priority. As part of our continuing mission to provide you with exceptional heart care, we have created designated Provider Care Teams. These Care Teams include your primary Cardiologist (physician) and Advanced Practice Providers (APPs- Physician Assistants and Nurse Practitioners) who all work together to provide you with the care you need, when you need it.   You may see any of the following providers on your designated Care Team at your next follow up: [name] Dr Glori Bickers . Dr Loralie Champagne . Darrick Grinder, NP . Lyda Jester, PA . Audry Riles, PharmD   Please be sure to bring in all your medications bottles to every appointment.

## 2020-03-07 NOTE — Progress Notes (Signed)
Subjective:  Patient ID: Adam Reid, male    DOB: 1945/08/06  Age: 74 y.o. MRN: [id number]  CC:  Chief Complaint  Patient presents with  . Diabetes    2 m f/u -stoped Januvia and feels blood sugar is good without it.   . Fall    had a small tumble and hurt his head a little bit but he is doing well with this. He twisted his ankle a bit and that is why he fell.    Wife translating.  HPI Adam Reid presents for    Diabetes: Complicated by CKD, microalbuminuria, CHF, previous episodes of hypoglycemia.  Actos has been discontinued given history of CHF. , option of SGLT2 but would need to be discussed with nephrologist.  Still some lows when discussed in June.  Further decreasing glipizide to 5 mg daily.  Continued Januvia same dose.  Due for A1c today. Nephrology - Dr. [name],   He has stopped Januvia - ran out and difficulty with refill. Off past month.  Feels better off Januvia.  Still on glipizide 69m QD.  Fasting 150-170, postprandial 160-180 No lows.   Microalbumin: Ratio 698 in October 2020 Optho, foot exam, pneumovax:  Ophthalmology: due.   Lab Results  Component Value Date   HGBA1C 7.3 (H) 03/07/2020   HGBA1C 6.9 (H) 11/30/2019   HGBA1C 7.3 (A) 07/20/2019   Lab Results  Component Value Date   MICROALBUR 60.3 09/02/2015   LDLCALC 57 07/20/2019   CREATININE 3.33 (H) 03/07/2020   Fall at home: Occurred yesterday in backyard.  Left ankle twisted, fell onto. No ankle pain, able to walk without difficulty.  Abrasion to scalp, hit root of tree, scratched scalp = cleansed with soap of water, abx ointment.  No LOC.  No headache/N/V.  He is on Eliquis  Elevated TSH: 4.7 on May 21, 5.42 on June 24, 4.68 today at CHF appt today. Normal free t4 today. No meds.  Depression: Feeling more depressed past few months. Feels down at times. Decreased interest. No missed doses of Celexa.  No SI/HI.  No prior counseling- declines.   QTC 476 on EKG today. Declines  med changes for now. Plans on more exercise   Depression [facility] 03/07/2020 01/03/2020 12/06/2019 11/30/2019 09/17/2019  Decreased Interest 2 0 0 0 1  Down, Depressed, Hopeless 1 0 0 1 0  PHQ - 2 Score 3 0 0 1 1  Altered sleeping 0 - - - -  Tired, decreased energy 0 - - - -  Change in appetite 0 - - - -  Feeling bad or failure about yourself  2 - - - -  Trouble concentrating 0 - - - -  Moving slowly or fidgety/restless 2 - - - -  Suicidal thoughts 0 - - - -  PHQ-9 Score 7 - - - -  Difficult doing work/chores Somewhat difficult - - - -  Some recent data might be hidden     History Patient Active Problem List   Diagnosis Date Noted  . Acute CHF (congestive heart failure) ([facility] 09/24/2019  . ARF (acute renal failure) ([facility] 09/24/2019  . Anemia 09/24/2019  . Acute respiratory failure with hypoxia ([facility]   . Secondary hyperparathyroidism, renal ([facility] 12/29/2016  . Smoker 03/26/2016  . Hypotension 01/29/2016  . Abnormal myocardial perfusion study 01/29/2016  . Depression 12/09/2015  . Hyperlipidemia LDL goal <100 06/28/2014  . Diabetes mellitus with renal manifestations, controlled ([facility]   . HTN (hypertension) 06/24/2014  . 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 BPH (benign prostatic hyperplasia) 06/24/2014   Past Medical History:  Diagnosis Date  . Anemia   . CHF (congestive heart failure) ([place])   . Chronic kidney disease   . Depression   . Diabetes mellitus without complication ([place])   . Hyperlipidemia   . Hypertension   . Thyroid disease    was on supplement, taken off by [name]   Past Surgical History:  Procedure Laterality Date  . APPENDECTOMY    . CARDIAC CATHETERIZATION N/A 01/30/2016   Procedure: Left Heart Cath and Coronary Angiography;  Surgeon: [name], MD;  Location: [place] [facility];  Service: Cardiovascular;  Laterality: N/A;   No Known Allergies Prior to Admission medications     Medication Sig Start Date End Date Taking? Authorizing Provider  amiodarone (PACERONE) 200 MG tablet Take 1 tablet (200 mg total) by mouth daily. 11/27/19  Yes Clegg, Amy D, NP  apixaban (ELIQUIS) 2.5 MG TABS tablet Take 1 tablet (2.5 mg total) by mouth 2 (two) times daily. 03/07/20  Yes Bensimhon, Shaune Pascal, MD  atorvastatin (LIPITOR) 80 MG tablet TAKE 1 TABLET(80 MG) BY MOUTH DAILY 01/08/20  Yes [name], MD  blood glucose meter kit and supplies Use up to two times daily as directed  ICD10 E10.9 E11.9 02/20/19  Yes [name], MD  Blood Glucose Monitoring Suppl (ONETOUCH VERIO) w/Device KIT as directed.  09/03/15  Yes [provider]  citalopram (CELEXA) 20 MG tablet TAKE 1 TABLET(20 MG) BY MOUTH DAILY 01/08/20  Yes [name], MD  cyanocobalamin 500 MCG tablet Take 500 mcg by mouth daily. Reported on 12/02/2015   Yes [provider]  dapagliflozin propanediol (FARXIGA) 5 MG TABS tablet Take 1 tablet (5 mg total) by mouth daily. 03/07/20  Yes Bensimhon, Shaune Pascal, MD  diltiazem (CARDIZEM CD) 180 MG 24 hr capsule TAKE 1 CAPSULE(180 MG) BY MOUTH DAILY 12/27/19  Yes Elouise Munroe, MD  glipiZIDE (GLUCOTROL XL) 5 MG 24 hr tablet Take 1 tablet (5 mg total) by mouth daily with breakfast. 01/03/20  Yes [name], MD  GlucoCom Lancets MISC Use for home glucose monitoring 09/02/15  Yes Jeffery, Chelle, PA  glucose blood (ONETOUCH VERIO) test strip Test up to 2 times per day.  Uncontrolled diabetes with hyperglycemia and stage 4 CKD. 04/13/19  Yes [name], MD  Omega 3-6-9 Fatty Acids (OMEGA 3-6-9 COMPLEX PO) Take 1 tablet by mouth daily.   Yes [provider]  pantoprazole (PROTONIX) 40 MG tablet Take 1 tablet (40 mg total) by mouth daily. 12/25/19  Yes Lemmon, Lavone Nian, PA  polyethylene glycol (MIRALAX / GLYCOLAX) 17 g packet Take 17 g by mouth daily as needed for mild constipation or moderate constipation.    Yes [provider]   senna (SENOKOT) 8.6 MG tablet Take 1 tablet by mouth as needed for constipation.    Yes [provider]  tamsulosin (FLOMAX) 0.4 MG CAPS capsule TAKE 2 CAPSULES(0.8 MG) BY MOUTH DAILY 01/08/20  Yes [name], MD  torsemide (DEMADEX) 20 MG tablet Take 1 tablet (20 mg total) by mouth as needed. 03/07/20  Yes Larey Dresser, MD  JANUVIA 25 MG tablet TAKE 1 TABLET(25 MG) BY MOUTH DAILY Patient not taking: Reported on 03/07/2020 01/29/20   [name], MD   Social History   Socioeconomic History  . Marital status: Married  Spouse name: [name]  . Number of children: 1  . Years of education: 12th grade  . Highest education level: Not on file  Occupational History  . Occupation: Retired-accountant  Tobacco Use  . Smoking status: Light Tobacco Smoker    Packs/day: 0.10    Years: 48.00    Pack years: 4.80    Types: Cigarettes    Last attempt to quit: 07/16/2019    Years since quitting: 0.6  . Smokeless tobacco: Never Used  . Tobacco comment: per pt he has one cigarette every couple days  Vaping Use  . Vaping Use: Never used  Substance and Sexual Activity  . Alcohol use: No    Alcohol/week: 0.0 standard drinks  . Drug use: No  . Sexual activity: Never    Partners: Female  Other Topics Concern  . Not on file  Social History Narrative   Originally from [place]. Came to the [place] in 2009, following their son who came here for school.   Married.   Lives with his wife.   Their adult son lives in [place], [place], where he is in optometry school.   Education: [employer]   Exercise: No   Social Determinants of Radio broadcast assistant Strain:   . Difficulty of Paying Living Expenses: Not on file  Food Insecurity:   . Worried About Charity fundraiser in the Last Year: Not on file  . Ran Out of Food in the Last Year: Not on file  Transportation Needs:   . Lack of Transportation (Medical): Not on file  . Lack of Transportation (Non-Medical): Not on file  Physical  Activity:   . Days of Exercise per Week: Not on file  . Minutes of Exercise per Session: Not on file  Stress:   . Feeling of Stress : Not on file  Social Connections:   . Frequency of Communication with Friends and Family: Not on file  . Frequency of Social Gatherings with Friends and Family: Not on file  . Attends Religious Services: Not on file  . Active Member of Clubs or Organizations: Not on file  . Attends Archivist Meetings: Not on file  . Marital Status: Not on file  Intimate Partner Violence:   . Fear of Current or Ex-Partner: Not on file  . Emotionally Abused: Not on file  . Physically Abused: Not on file  . Sexually Abused: Not on file    Review of Systems Per hpi   Objective:   Vitals:   03/07/20 1334  BP: 131/74  Pulse: 72  Temp: 98 F (36.7 C)  TempSrc: Temporal  SpO2: 97%  Weight: 135 lb 3.2 oz (61.3 kg)  Height: 5' 6" (1.676 m)       Physical Exam Vitals reviewed.  Constitutional:      Appearance: He is well-developed.  HENT:     Head: Normocephalic and atraumatic.  Eyes:     Pupils: Pupils are equal, round, and reactive to light.  Neck:     Vascular: No carotid bruit or JVD.  Cardiovascular:     Rate and Rhythm: Normal rate and regular rhythm.     Heart sounds: Normal heart sounds. No murmur heard.   Pulmonary:     Effort: Pulmonary effort is normal.     Breath sounds: Normal breath sounds. No rales.  Skin:    General: Skin is warm and dry.     Comments: Abrasion left scalp   Neurological:     Mental  Status: He is alert and oriented to person, place, and time.      Results for orders placed or performed during the hospital encounter of 03/07/20  T3, free  Result Value Ref Range   T3, Free 1.8 (L) 2.0 - 4.4 pg/mL  T4, free  Result Value Ref Range   Free T4 0.96 0.61 - 1.12 ng/dL  Comprehensive metabolic panel  Result Value Ref Range   Sodium 137 135 - 145 mmol/L   Potassium 4.0 3.5 - 5.1 mmol/L   Chloride 102 98 -  111 mmol/L   CO2 23 22 - 32 mmol/L   Glucose, Bld 167 (H) 70 - 99 mg/dL   BUN 60 (H) 8 - 23 mg/dL   Creatinine, Ser 3.33 (H) 0.61 - 1.24 mg/dL   Calcium 9.4 8.9 - 10.3 mg/dL   Total Protein 8.1 6.5 - 8.1 g/dL   Albumin 3.6 3.5 - 5.0 g/dL   AST 17 15 - 41 U/L   ALT 21 0 - 44 U/L   Alkaline Phosphatase 104 38 - 126 U/L   Total Bilirubin 0.3 0.3 - 1.2 mg/dL   GFR calc non Af Amer 17 (L) >60 mL/min   GFR calc Af Amer 20 (L) >60 mL/min   Anion gap 12 5 - 15  CBC  Result Value Ref Range   WBC 10.5 4.0 - 10.5 K/uL   RBC 4.51 4.22 - 5.81 MIL/uL   Hemoglobin 11.9 (L) 13.0 - 17.0 g/dL   HCT 37.8 (L) 39 - 52 %   MCV 83.8 80.0 - 100.0 fL   MCH 26.4 26.0 - 34.0 pg   MCHC 31.5 30.0 - 36.0 g/dL   RDW 18.1 (H) 11.5 - 15.5 %   Platelets 349 150 - 400 K/uL   nRBC 0.0 0.0 - 0.2 %  TSH  Result Value Ref Range   TSH 4.686 (H) 0.350 - 4.500 uIU/mL   .  Assessment & Plan:  Hisham Provence is a 74 y.o. male . Fall in home, initial encounter Abrasion of scalp, initial encounter  -Head injury with abrasion of scalp after mechanical fall., no other concerning findings.  ER precautions given and handout given for head injury, especially with use of blood thinner but no apparent indication for neuroimaging at this time.  Spouse aware of ER precautions.  Continue symptomatic care for abrasion.  Type 2 diabetes mellitus with chronic kidney disease, without long-term current use of insulin, unspecified CKD stage ([place]) - Plan: POCT glucose (manual entry), Hemoglobin A1c  -Check A1c, then can decide on med changes.  Feels well on current regimen.  Some medications limited with his CKD.  Elevated TSH  -Mild elevation previously, overall stable, with normal free T4 recently.  Hold on meds at this time with continued monitoring.  Depression, unspecified depression type  -Reports some increase symptoms.  Recommended adjustment of dose of medication, declined at this time.  Decided to continue same dose of  Celexa.  If we do increase Celexa would need to closely monitor QT.  Likely EKG within a week of starting that medication.  Also recommended counseling which was declined at this time.  He plans on exercise approach, handout given.  RTC precautions.  No orders of the defined types were placed in this encounter.  Patient Instructions    If any new headache or worsening symptoms after recent fall, be seen in ER or urgent care as you are on a blood thinner.  Return to the clinic or go  to the nearest emergency room if any of your symptoms worsen or new symptoms occur. Schedule eye doctor visit.  Continue glipizide for now, then option of lower dose [place].   As far as depression I am concerned about some of your increased symptoms.  I would recommend meeting with a therapist - let me know if you change your mind.  We can also try a higher dose of the citalopram but with your other medicines would have to watch your electrocardiogram or one of the findings on the EKG very closely as the combination of your depression medicine and the amiodarone can affect that rhythm.  If you do want to try a higher dose of medication let me know but we would have to watch your EKG close.    Living With Depression Everyone experiences occasional disappointment, sadness, and loss in their lives. When you are feeling down, blue, or sad for at least 2 weeks in a row, it may mean that you have depression. Depression can affect your thoughts and feelings, relationships, daily activities, and physical health. It is caused by changes in the way your brain functions. If you receive a diagnosis of depression, your health care provider will tell you which type of depression you have and what treatment options are available to you. If you are living with depression, there are ways to help you recover from it and also ways to prevent it from coming back. How to cope with lifestyle changes Coping with stress     Stress is your  body's reaction to life changes and events, both good and bad. Stressful situations may include:  Getting married.  The death of a spouse.  Losing a job.  Retiring.  Having a baby. Stress can last just a few hours or it can be ongoing. Stress can play a major role in depression, so it is important to learn both how to cope with stress and how to think about it differently. Talk with your health care provider or a counselor if you would like to learn more about stress reduction. He or she may suggest some stress reduction techniques, such as:  Music therapy. This can include creating music or listening to music. Choose music that you enjoy and that inspires you.  Mindfulness-based meditation. This kind of meditation can be done while sitting or walking. It involves being aware of your normal breaths, rather than trying to control your breathing.  Centering prayer. This is a kind of meditation that involves focusing on a spiritual word or phrase. Choose a word, phrase, or sacred image that is meaningful to you and that brings you peace.  Deep breathing. To do this, expand your stomach and inhale slowly through your nose. Hold your breath for 3-5 seconds, then exhale slowly, allowing your stomach muscles to relax.  Muscle relaxation. This involves intentionally tensing muscles then relaxing them. Choose a stress reduction technique that fits your lifestyle and personality. Stress reduction techniques take time and practice to develop. Set aside 5-15 minutes a day to do them. Therapists can offer training in these techniques. The training may be covered by some insurance plans. Other things you can do to manage stress include:  Keeping a stress diary. This can help you learn what triggers your stress and ways to control your response.  Understanding what your limits are and saying no to requests or events that lead to a schedule that is too full.  Thinking about how you respond to certain  situations. You  may not be able to control everything, but you can control how you react.  Adding humor to your life by watching funny films or TV shows.  Making time for activities that help you relax and not feeling guilty about spending your time this way.  Medicines Your health care provider may suggest certain medicines if he or she feels that they will help improve your condition. Avoid using alcohol and other substances that may prevent your medicines from working properly (may interact). It is also important to:  Talk with your pharmacist or health care provider about all the medicines that you take, their possible side effects, and what medicines are safe to take together.  Make it your goal to take part in all treatment decisions (shared decision-making). This includes giving input on the side effects of medicines. It is best if shared decision-making with your health care provider is part of your total treatment plan. If your health care provider prescribes a medicine, you may not notice the full benefits of it for 4-8 weeks. Most people who are treated for depression need to be on medicine for at least 6-12 months after they feel better. If you are taking medicines as part of your treatment, do not stop taking medicines without first talking to your health care provider. You may need to have the medicine slowly decreased (tapered) over time to decrease the risk of harmful side effects. Relationships Your health care provider may suggest family therapy along with individual therapy and drug therapy. While there may not be family problems that are causing you to feel depressed, it is still important to make sure your family learns as much as they can about your mental health. Having your family's support can help make your treatment successful. How to recognize changes in your condition Everyone has a different response to treatment for depression. Recovery from major depression happens  when you have not had signs of major depression for two months. This may mean that you will start to:  Have more interest in doing activities.  Feel less hopeless than you did 2 months ago.  Have more energy.  Overeat less often, or have better or improving appetite.  Have better concentration. Your health care provider will work with you to decide the next steps in your recovery. It is also important to recognize when your condition is getting worse. Watch for these signs:  Having fatigue or low energy.  Eating too much or too little.  Sleeping too much or too little.  Feeling restless, agitated, or hopeless.  Having trouble concentrating or making decisions.  Having unexplained physical complaints.  Feeling irritable, angry, or aggressive. Get help as soon as you or your family members notice these symptoms coming back. How to get support and help from others How to talk with friends and family members about your condition  Talking to friends and family members about your condition can provide you with one way to get support and guidance. Reach out to trusted friends or family members, explain your symptoms to them, and let them know that you are working with a health care provider to treat your depression. Financial resources Not all insurance plans cover mental health care, so it is important to check with your insurance carrier. If paying for co-pays or counseling services is a problem, search for a local or county mental health care center. They may be able to offer public mental health care services at low or no cost when you are not  able to see a private health care provider. If you are taking medicine for depression, you may be able to get the generic form, which may be less expensive. Some makers of prescription medicines also offer help to patients who cannot afford the medicines they need. Follow these instructions at home:   Get the right amount and quality of  sleep.  Cut down on using caffeine, tobacco, alcohol, and other potentially harmful substances.  Try to exercise, such as walking or lifting small weights.  Take over-the-counter and prescription medicines only as told by your health care provider.  Eat a healthy diet that includes plenty of vegetables, fruits, whole grains, low-fat dairy products, and lean protein. Do not eat a lot of foods that are high in solid fats, added sugars, or salt.  Keep all follow-up visits as told by your health care provider. This is important. Contact a health care provider if:  You stop taking your antidepressant medicines, and you have any of these symptoms: ? Nausea. ? Headache. ? Feeling lightheaded. ? Chills and body aches. ? Not being able to sleep (insomnia).  You or your friends and family think your depression is getting worse. Get help right away if:  You have thoughts of hurting yourself or others. If you ever feel like you may hurt yourself or others, or have thoughts about taking your own life, get help right away. You can go to your nearest emergency department or call:  Your local emergency services (911 in the [place]  A suicide crisis helpline, such as the [facility] at 276 873 9049. This is open 24-hours a day. Summary  If you are living with depression, there are ways to help you recover from it and also ways to prevent it from coming back.  Work with your health care team to create a management plan that includes counseling, stress management techniques, and healthy lifestyle habits. This information is not intended to replace advice given to you by your health care provider. Make sure you discuss any questions you have with your health care provider. Document Revised: 10/20/2018 Document Reviewed: 05/31/2016 Elsevier Patient Education  [employer].   If you have lab work done today you will be contacted with your lab results within the next 2  weeks.  If you have not heard from [place] then please contact us. The fastest way to get your results is to register for My Chart.   IF you received an x-ray today, you will receive an invoice from [facility]. Please contact [facility] at [phone] with questions or concerns regarding your invoice.   IF you received labwork today, you will receive an invoice from [facility]. Please contact [facility] at [phone] with questions or concerns regarding your invoice.   Our billing staff will not be able to assist you with questions regarding bills from these companies.  You will be contacted with the lab results as soon as they are available. The fastest way to get your results is to activate your My Chart account. Instructions are located on the last page of this paperwork. If you have not heard from [place] regarding the results in 2 weeks, please contact this office.          Signed, Merri Ray, MD [facility]

## 2020-03-07 NOTE — Patient Instructions (Addendum)
If any new headache or worsening symptoms after recent fall, be seen in ER or urgent care as you are on a blood thinner.  Return to the clinic or go to the nearest emergency room if any of your symptoms worsen or new symptoms occur. Schedule eye doctor visit.  Continue glipizide for now, then option of lower dose [place].   As far as depression I am concerned about some of your increased symptoms.  I would recommend meeting with a therapist - let me know if you change your mind.  We can also try a higher dose of the citalopram but with your other medicines would have to watch your electrocardiogram or one of the findings on the EKG very closely as the combination of your depression medicine and the amiodarone can affect that rhythm.  If you do want to try a higher dose of medication let me know but we would have to watch your EKG close.    Living With Depression Everyone experiences occasional disappointment, sadness, and loss in their lives. When you are feeling down, blue, or sad for at least 2 weeks in a row, it may mean that you have depression. Depression can affect your thoughts and feelings, relationships, daily activities, and physical health. It is caused by changes in the way your brain functions. If you receive a diagnosis of depression, your health care provider will tell you which type of depression you have and what treatment options are available to you. If you are living with depression, there are ways to help you recover from it and also ways to prevent it from coming back. How to cope with lifestyle changes Coping with stress     Stress is your body's reaction to life changes and events, both good and bad. Stressful situations may include:  Getting married.  The death of a spouse.  Losing a job.  Retiring.  Having a baby. Stress can last just a few hours or it can be ongoing. Stress can play a major role in depression, so it is important to learn both how to cope with  stress and how to think about it differently. Talk with your health care provider or a counselor if you would like to learn more about stress reduction. He or she may suggest some stress reduction techniques, such as:  Music therapy. This can include creating music or listening to music. Choose music that you enjoy and that inspires you.  Mindfulness-based meditation. This kind of meditation can be done while sitting or walking. It involves being aware of your normal breaths, rather than trying to control your breathing.  Centering prayer. This is a kind of meditation that involves focusing on a spiritual word or phrase. Choose a word, phrase, or sacred image that is meaningful to you and that brings you peace.  Deep breathing. To do this, expand your stomach and inhale slowly through your nose. Hold your breath for 3-5 seconds, then exhale slowly, allowing your stomach muscles to relax.  Muscle relaxation. This involves intentionally tensing muscles then relaxing them. Choose a stress reduction technique that fits your lifestyle and personality. Stress reduction techniques take time and practice to develop. Set aside 5-15 minutes a day to do them. Therapists can offer training in these techniques. The training may be covered by some insurance plans. Other things you can do to manage stress include:  Keeping a stress diary. This can help you learn what triggers your stress and ways to control your response.  Understanding  what your limits are and saying no to requests or events that lead to a schedule that is too full.  Thinking about how you respond to certain situations. You may not be able to control everything, but you can control how you react.  Adding humor to your life by watching funny films or TV shows.  Making time for activities that help you relax and not feeling guilty about spending your time this way.  Medicines Your health care provider may suggest certain medicines if he or  she feels that they will help improve your condition. Avoid using alcohol and other substances that may prevent your medicines from working properly (may interact). It is also important to:  Talk with your pharmacist or health care provider about all the medicines that you take, their possible side effects, and what medicines are safe to take together.  Make it your goal to take part in all treatment decisions (shared decision-making). This includes giving input on the side effects of medicines. It is best if shared decision-making with your health care provider is part of your total treatment plan. If your health care provider prescribes a medicine, you may not notice the full benefits of it for 4-8 weeks. Most people who are treated for depression need to be on medicine for at least 6-12 months after they feel better. If you are taking medicines as part of your treatment, do not stop taking medicines without first talking to your health care provider. You may need to have the medicine slowly decreased (tapered) over time to decrease the risk of harmful side effects. Relationships Your health care provider may suggest family therapy along with individual therapy and drug therapy. While there may not be family problems that are causing you to feel depressed, it is still important to make sure your family learns as much as they can about your mental health. Having your family's support can help make your treatment successful. How to recognize changes in your condition Everyone has a different response to treatment for depression. Recovery from major depression happens when you have not had signs of major depression for two months. This may mean that you will start to:  Have more interest in doing activities.  Feel less hopeless than you did 2 months ago.  Have more energy.  Overeat less often, or have better or improving appetite.  Have better concentration. Your health care provider will work  with you to decide the next steps in your recovery. It is also important to recognize when your condition is getting worse. Watch for these signs:  Having fatigue or low energy.  Eating too much or too little.  Sleeping too much or too little.  Feeling restless, agitated, or hopeless.  Having trouble concentrating or making decisions.  Having unexplained physical complaints.  Feeling irritable, angry, or aggressive. Get help as soon as you or your family members notice these symptoms coming back. How to get support and help from others How to talk with friends and family members about your condition  Talking to friends and family members about your condition can provide you with one way to get support and guidance. Reach out to trusted friends or family members, explain your symptoms to them, and let them know that you are working with a health care provider to treat your depression. Financial resources Not all insurance plans cover mental health care, so it is important to check with your insurance carrier. If paying for co-pays or counseling services is a  problem, search for a local or county mental health care center. They may be able to offer public mental health care services at low or no cost when you are not able to see a private health care provider. If you are taking medicine for depression, you may be able to get the generic form, which may be less expensive. Some makers of prescription medicines also offer help to patients who cannot afford the medicines they need. Follow these instructions at home:   Get the right amount and quality of sleep.  Cut down on using caffeine, tobacco, alcohol, and other potentially harmful substances.  Try to exercise, such as walking or lifting small weights.  Take over-the-counter and prescription medicines only as told by your health care provider.  Eat a healthy diet that includes plenty of vegetables, fruits, whole grains, low-fat dairy  products, and lean protein. Do not eat a lot of foods that are high in solid fats, added sugars, or salt.  Keep all follow-up visits as told by your health care provider. This is important. Contact a health care provider if:  You stop taking your antidepressant medicines, and you have any of these symptoms: ? Nausea. ? Headache. ? Feeling lightheaded. ? Chills and body aches. ? Not being able to sleep (insomnia).  You or your friends and family think your depression is getting worse. Get help right away if:  You have thoughts of hurting yourself or others. If you ever feel like you may hurt yourself or others, or have thoughts about taking your own life, get help right away. You can go to your nearest emergency department or call:  Your local emergency services (911 in the [place]  A suicide crisis helpline, such as the [facility] at (434)577-1501. This is open 24-hours a day. Summary  If you are living with depression, there are ways to help you recover from it and also ways to prevent it from coming back.  Work with your health care team to create a management plan that includes counseling, stress management techniques, and healthy lifestyle habits. This information is not intended to replace advice given to you by your health care provider. Make sure you discuss any questions you have with your health care provider. Document Revised: 10/20/2018 Document Reviewed: 05/31/2016 Elsevier Patient Education  [employer].   If you have lab work done today you will be contacted with your lab results within the next 2 weeks.  If you have not heard from [place] then please contact us. The fastest way to get your results is to register for My Chart.   IF you received an x-ray today, you will receive an invoice from [facility]. Please contact [facility] at [phone] with questions or concerns regarding your invoice.   IF you received  labwork today, you will receive an invoice from [facility]. Please contact [facility] at [phone] with questions or concerns regarding your invoice.   Our billing staff will not be able to assist you with questions regarding bills from these companies.  You will be contacted with the lab results as soon as they are available. The fastest way to get your results is to activate your My Chart account. Instructions are located on the last page of this paperwork. If you have not heard from [place] regarding the results in 2 weeks, please contact this office.

## 2020-03-08 LAB — T3, FREE: T3, Free: 1.8 pg/mL — ABNORMAL LOW (ref 2.0–4.4)

## 2020-03-08 LAB — HEMOGLOBIN A1C
Est. average glucose Bld gHb Est-mCnc: 163 mg/dL
Hgb A1c MFr Bld: 7.3 % — ABNORMAL HIGH (ref 4.8–5.6)

## 2020-03-09 ENCOUNTER — Encounter: Payer: Self-pay | Admitting: Family Medicine

## 2020-03-14 ENCOUNTER — Other Ambulatory Visit: Payer: Self-pay

## 2020-03-14 ENCOUNTER — Encounter (HOSPITAL_COMMUNITY)
Admission: RE | Admit: 2020-03-14 | Discharge: 2020-03-14 | Disposition: A | Discharge status: Home / Self Care | Payer: Medicare Other | Source: Ambulatory Visit | Attending: Nephrology | Admitting: Nephrology

## 2020-03-14 VITALS — BP 144/74 | HR 72 | Resp 16

## 2020-03-14 DIAGNOSIS — N179 Acute kidney failure, unspecified: Secondary | ICD-10-CM | POA: Diagnosis not present

## 2020-03-14 LAB — POCT HEMOGLOBIN-HEMACUE: Hemoglobin: 11.6 g/dL — ABNORMAL LOW (ref 13.0–17.0)

## 2020-03-14 MED ORDER — EPOETIN ALFA-EPBX 10000 UNIT/ML IJ SOLN
10000.0000 [IU] | INTRAMUSCULAR | Status: DC
  Administered 2020-03-14: 10000 [IU] via SUBCUTANEOUS

## 2020-03-14 MED ORDER — EPOETIN ALFA-EPBX 10000 UNIT/ML IJ SOLN
INTRAMUSCULAR | Status: AC
  Filled 2020-03-14: qty 1

## 2020-03-18 ENCOUNTER — Telehealth: Payer: Self-pay | Admitting: Family Medicine

## 2020-03-18 NOTE — Telephone Encounter (Signed)
See last visit and lab notes.  My understanding was that he was not feeling well on the Januvia.  If they want to restart it, that is fine, okay to refill at 25 mg daily, but if any return of malaise or not feeling well on that medication, would choose different approach.  Let me know.

## 2020-03-18 NOTE — Telephone Encounter (Signed)
Spoke with patient's wife regarding the request for januvia 25 mg tab, pt has been off of it and noted at last visit. However FBS readings are 150 to 160's and a little higher after meals. She thinks he may need to go back on januvia. Still takes glipizide 5 mg.

## 2020-03-18 NOTE — Telephone Encounter (Signed)
Pt is requesting refill on this med pt blood sugar is high  What is the name of the medication?  JANUVIA 25 MG tablet [[id number]   Have you contacted your pharmacy to request a refill? Y  Which pharmacy would you like this sent to? [id number] [address], [address] AT [facility] OF [address] & [place]  [address] [place] [address]  Phone:  [phone] Fax:  [phone]    Patient notified that their request is being sent to the clinical staff for review and that they should receive a call once it is complete. If they do not receive a call within 72 hours they can check with their pharmacy or our office.

## 2020-03-19 ENCOUNTER — Other Ambulatory Visit: Payer: Self-pay | Admitting: Internal Medicine

## 2020-03-19 ENCOUNTER — Other Ambulatory Visit: Payer: Self-pay | Admitting: Emergency Medicine

## 2020-03-19 DIAGNOSIS — E1122 Type 2 diabetes mellitus with diabetic chronic kidney disease: Secondary | ICD-10-CM

## 2020-03-19 MED ORDER — SITAGLIPTIN PHOSPHATE 25 MG PO TABS: ORAL_TABLET | ORAL | 0 refills | Status: DC

## 2020-03-19 NOTE — Telephone Encounter (Signed)
Januvia 25 mg has been sent to pharmacy

## 2020-03-19 NOTE — Telephone Encounter (Signed)
Patient wife stated they will try this medication again and was informed if start to feel bad again stop this medication and we can try a different approach. Wife understood and will let us know.

## 2020-03-20 ENCOUNTER — Other Ambulatory Visit (HOSPITAL_COMMUNITY): Payer: Self-pay

## 2020-03-27 ENCOUNTER — Other Ambulatory Visit: Payer: Self-pay | Admitting: Family Medicine

## 2020-03-27 DIAGNOSIS — E1122 Type 2 diabetes mellitus with diabetic chronic kidney disease: Secondary | ICD-10-CM

## 2020-03-28 ENCOUNTER — Ambulatory Visit (HOSPITAL_COMMUNITY)
Admission: RE | Admit: 2020-03-28 | Discharge: 2020-03-28 | Disposition: A | Discharge status: Home / Self Care | Payer: Medicare Other | Source: Ambulatory Visit | Attending: Internal Medicine | Admitting: Internal Medicine

## 2020-03-28 ENCOUNTER — Other Ambulatory Visit: Payer: Self-pay

## 2020-03-28 ENCOUNTER — Encounter (HOSPITAL_COMMUNITY): Payer: Medicare Other

## 2020-03-28 DIAGNOSIS — I5022 Chronic systolic (congestive) heart failure: Secondary | ICD-10-CM | POA: Diagnosis present

## 2020-03-28 LAB — BASIC METABOLIC PANEL
Anion gap: 10 (ref 5–15)
BUN: 54 mg/dL — ABNORMAL HIGH (ref 8–23)
CO2: 22 mmol/L (ref 22–32)
Calcium: 8.7 mg/dL — ABNORMAL LOW (ref 8.9–10.3)
Chloride: 106 mmol/L (ref 98–111)
Creatinine, Ser: 3.4 mg/dL — ABNORMAL HIGH (ref 0.61–1.24)
GFR calc Af Amer: 20 mL/min — ABNORMAL LOW (ref >60)
GFR calc non Af Amer: 17 mL/min — ABNORMAL LOW (ref >60)
Glucose, Bld: 251 mg/dL — ABNORMAL HIGH (ref 70–99)
Potassium: 4.1 mmol/L (ref 3.5–5.1)
Sodium: 138 mmol/L (ref 135–145)

## 2020-04-04 ENCOUNTER — Ambulatory Visit (INDEPENDENT_AMBULATORY_CARE_PROVIDER_SITE_OTHER): Payer: Medicare Other | Admitting: Family Medicine

## 2020-04-04 ENCOUNTER — Encounter: Payer: Self-pay | Admitting: Family Medicine

## 2020-04-04 ENCOUNTER — Other Ambulatory Visit: Payer: Self-pay

## 2020-04-04 VITALS — BP 86/50 | HR 73 | Temp 98.2°F | Ht 66.0 in | Wt 136.6 lb

## 2020-04-04 DIAGNOSIS — F329 Major depressive disorder, single episode, unspecified: Secondary | ICD-10-CM

## 2020-04-04 DIAGNOSIS — E1122 Type 2 diabetes mellitus with diabetic chronic kidney disease: Secondary | ICD-10-CM | POA: Diagnosis not present

## 2020-04-04 DIAGNOSIS — I959 Hypotension, unspecified: Secondary | ICD-10-CM

## 2020-04-04 DIAGNOSIS — R140 Abdominal distension (gaseous): Secondary | ICD-10-CM | POA: Diagnosis not present

## 2020-04-04 DIAGNOSIS — F32A Depression, unspecified: Secondary | ICD-10-CM

## 2020-04-04 DIAGNOSIS — I2510 Atherosclerotic heart disease of native coronary artery without angina pectoris: Secondary | ICD-10-CM | POA: Diagnosis not present

## 2020-04-04 NOTE — Patient Instructions (Addendum)
   Cut back on carbonated beverages, see list of [place] - foods that can contribute to bloating. If those symptoms persist, return to discuss further.  Return to the clinic or go to the nearest emergency room if any of your symptoms worsen or new symptoms occur.  No change in diabetes meds for now. Recheck in 2 months.   Repeat blood pressure looks okay today.  If any low blood pressures at home, lightheadedness, or dizziness, be seen.  Follow-up in 1 to 2 months for repeat testing.  Return to the clinic or go to the nearest emergency room if any of your symptoms worsen or new symptoms occur.     If you have lab work done today you will be contacted with your lab results within the next 2 weeks.  If you have not heard from [place] then please contact us. The fastest way to get your results is to register for My Chart.   IF you received an x-ray today, you will receive an invoice from [facility]. Please contact [facility] at [phone] with questions or concerns regarding your invoice.   IF you received labwork today, you will receive an invoice from [facility]. Please contact [facility] at [phone] with questions or concerns regarding your invoice.   Our billing staff will not be able to assist you with questions regarding bills from these companies.  You will be contacted with the lab results as soon as they are available. The fastest way to get your results is to activate your My Chart account. Instructions are located on the last page of this paperwork. If you have not heard from [place] regarding the results in 2 weeks, please contact this office.

## 2020-04-04 NOTE — Progress Notes (Signed)
Subjective:  Patient ID: Adam Reid, male    DOB: June 04, 1946  Age: 74 y.o. MRN: [id number]  CC:  Chief Complaint  Patient presents with  . Diabetes    blood sugar is better 130-160 since been back on januvia. Patient blood pressure was low but patient denies being light headed or any other symptoms. Patient did states bp was 116/86 this am  . Depression    PHQ9=2. Patient is doing much better     Here with spouse - translating.   HPI Adam Reid presents for   Diabetes: Complicated by CKD, microalbuminuria, CHF, previous episodes of hypoglycemia.  Actos previously discontinued given history of CHF, continued on glipizide 5 mg daily, had stopped Januvia prior to last visit, initially did report that he felt better off Januvia, then some elevated home readings.  Has since restarted Januvia since last visit.  With home readings 130- 160. Lowest 107.  No symptomatic lows.  Bloating at times, longstanding, no recent changes.  Some carbonated beverages few per week.. No n/v pain.  Lab Results  Component Value Date   HGBA1C 7.3 (H) 03/07/2020   HGBA1C 6.9 (H) 11/30/2019   HGBA1C 7.3 (A) 07/20/2019   Lab Results  Component Value Date   MICROALBUR 60.3 09/02/2015   LDLCALC 57 07/20/2019   CREATININE 3.40 (H) 03/28/2020   Depression: Discussed at August visit, more depression symptoms previous few months, no SI or HI.  Had been taking Celexa at that time.  We discussed increased dose, with close monitoring of QT interval.  He decided against medication changes.  Also declined counseling.  Does states symptoms have improved since last visit. Wife feels like he is better.  Enjoys walking daily.  [place] - enjoyed this time.   Depression screen [facility] 2/9 04/04/2020 03/07/2020 01/03/2020 12/06/2019 11/30/2019  Decreased Interest 0 2 0 0 0  Down, Depressed, Hopeless 0 1 0 0 1  PHQ - 2 Score 0 3 0 0 1  Altered sleeping 1 0 - - -  Tired, decreased  energy 1 0 - - -  Change in appetite 0 0 - - -  Feeling bad or failure about yourself  0 2 - - -  Trouble concentrating 0 0 - - -  Moving slowly or fidgety/restless 0 2 - - -  Suicidal thoughts 0 0 - - -  PHQ-9 Score 2 7 - - -  Difficult doing work/chores - Somewhat difficult - - -  Some recent data might be hidden   Hypotension: Low BP in office today. Does not feel dizzy/lightheaded. No PC, no palpitations. No new fatigue - feels great.  Not taking torsemide recently - only if swelling.  Taking diltiazem $RemoveBeforeDE'180mg'REexjzSDNWfrrAg$  qd.  Amiodarone and eliquis for Afib.  BP116/86 this am.  Late breakfast - no lunch - typical. Some less fluids today.  BP Readings from Last 3 Encounters:  04/04/20 (!) 86/50  03/14/20 (!) 144/74  03/07/20 (!) 120/58     History Patient Active Problem List   Diagnosis Date Noted  . Acute CHF (congestive heart failure) ([place]) 09/24/2019  . ARF (acute renal failure) ([place]) 09/24/2019  . Anemia 09/24/2019  . Acute respiratory failure with hypoxia ([place])   . Secondary hyperparathyroidism, renal ([place]) 12/29/2016  . Smoker 03/26/2016  . Hypotension 01/29/2016  . Abnormal myocardial perfusion study 01/29/2016  . Depression 12/09/2015  . Hyperlipidemia LDL goal <100 06/28/2014  . Diabetes mellitus with renal manifestations, controlled ([place])   .  HTN (hypertension) 06/24/2014  . 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 BPH (benign prostatic hyperplasia) 06/24/2014   Past Medical History:  Diagnosis Date  . Anemia   . CHF (congestive heart failure) ([place])   . Chronic kidney disease   . Depression   . Diabetes mellitus without complication ([place])   . Hyperlipidemia   . Hypertension   . Thyroid disease    was on supplement, taken off by [name]   Past Surgical History:  Procedure Laterality Date  . APPENDECTOMY    . CARDIAC CATHETERIZATION N/A 01/30/2016   Procedure: Left Heart Cath and Coronary Angiography;  Surgeon:  [name], MD;  Location: [facility];  Service: Cardiovascular;  Laterality: N/A;   No Known Allergies Prior to Admission medications   Medication Sig Start Date End Date Taking? Authorizing Provider  amiodarone (PACERONE) 200 MG tablet Take 1 tablet (200 mg total) by mouth daily. 11/27/19  Yes Clegg, Amy D, NP  apixaban (ELIQUIS) 2.5 MG TABS tablet Take 1 tablet (2.5 mg total) by mouth 2 (two) times daily. 03/07/20  Yes Bensimhon, Shaune Pascal, MD  atorvastatin (LIPITOR) 80 MG tablet TAKE 1 TABLET(80 MG) BY MOUTH DAILY 01/08/20  Yes [name], MD  blood glucose meter kit and supplies Use up to two times daily as directed  ICD10 E10.9 E11.9 02/20/19  Yes [name], MD  Blood Glucose Monitoring Suppl (ONETOUCH VERIO) w/Device KIT as directed.  09/03/15  Yes [provider]  cyanocobalamin 500 MCG tablet Take 500 mcg by mouth daily. Reported on 12/02/2015   Yes [provider]  dapagliflozin propanediol (FARXIGA) 5 MG TABS tablet Take 1 tablet (5 mg total) by mouth daily. 03/07/20  Yes Bensimhon, Shaune Pascal, MD  diltiazem (CARDIZEM CD) 180 MG 24 hr capsule TAKE 1 CAPSULE(180 MG) BY MOUTH DAILY 03/20/20  Yes Bensimhon, Shaune Pascal, MD  glipiZIDE (GLUCOTROL XL) 5 MG 24 hr tablet Take 1 tablet (5 mg total) by mouth daily with breakfast. 01/03/20  Yes [name], MD  GlucoCom Lancets MISC Use for home glucose monitoring 09/02/15  Yes Jeffery, Chelle, PA  glucose blood (ONETOUCH VERIO) test strip Test up to 2 times per day.  Uncontrolled diabetes with hyperglycemia and stage 4 CKD. 04/13/19  Yes [name], MD  Omega 3-6-9 Fatty Acids (OMEGA 3-6-9 COMPLEX PO) Take 1 tablet by mouth daily.   Yes [provider]  pantoprazole (PROTONIX) 40 MG tablet Take 1 tablet (40 mg total) by mouth daily. 12/25/19  Yes Lemmon, Lavone Nian, PA  polyethylene glycol (MIRALAX / GLYCOLAX) 17 g packet Take 17 g by mouth daily as needed for mild constipation or moderate  constipation.    Yes [provider]  senna (SENOKOT) 8.6 MG tablet Take 1 tablet by mouth as needed for constipation.    Yes [provider]  sitaGLIPtin (JANUVIA) 25 MG tablet TAKE 1 TABLET(25 MG) BY MOUTH DAILY 03/19/20  Yes [name], MD  tamsulosin (FLOMAX) 0.4 MG CAPS capsule TAKE 2 CAPSULES(0.8 MG) BY MOUTH DAILY 01/08/20  Yes [name], MD  torsemide (DEMADEX) 20 MG tablet Take 1 tablet (20 mg total) by mouth as needed. 03/07/20  Yes Larey Dresser, MD  citalopram (CELEXA) 20 MG tablet TAKE 1 TABLET(20 MG) BY MOUTH DAILY 01/08/20 03/07/20  [name], MD   Social History   Socioeconomic History  . Marital status: Married  Spouse name: [name]  . Number of children: 1  . Years of education: 12th grade  . Highest education level: Not on file  Occupational History  . Occupation: Retired-accountant  Tobacco Use  . Smoking status: Light Tobacco Smoker    Packs/day: 0.10    Years: 48.00    Pack years: 4.80    Types: Cigarettes    Last attempt to quit: 07/16/2019    Years since quitting: 0.7  . Smokeless tobacco: Never Used  . Tobacco comment: per pt he has one cigarette every couple days  Vaping Use  . Vaping Use: Never used  Substance and Sexual Activity  . Alcohol use: No    Alcohol/week: 0.0 standard drinks  . Drug use: No  . Sexual activity: Never    Partners: Female  Other Topics Concern  . Not on file  Social History Narrative   Originally from [place]. Came to the [place] in 2009, following their son who came here for school.   Married.   Lives with his wife.   Their adult son lives in [place], [place], where he is in optometry school.   Education: [employer]   Exercise: No   Social Determinants of Radio broadcast assistant Strain:   . Difficulty of Paying Living Expenses: Not on file  Food Insecurity:   . Worried About Charity fundraiser in the Last Year: Not on file  . Ran Out of Food in the Last Year: Not on file    Transportation Needs:   . Lack of Transportation (Medical): Not on file  . Lack of Transportation (Non-Medical): Not on file  Physical Activity:   . Days of Exercise per Week: Not on file  . Minutes of Exercise per Session: Not on file  Stress:   . Feeling of Stress : Not on file  Social Connections:   . Frequency of Communication with Friends and Family: Not on file  . Frequency of Social Gatherings with Friends and Family: Not on file  . Attends Religious Services: Not on file  . Active Member of Clubs or Organizations: Not on file  . Attends Archivist Meetings: Not on file  . Marital Status: Not on file  Intimate Partner Violence:   . Fear of Current or Ex-Partner: Not on file  . Emotionally Abused: Not on file  . Physically Abused: Not on file  . Sexually Abused: Not on file    Review of Systems   Objective:   Vitals:   04/04/20 1441 04/04/20 1445  BP: (!) 88/52 (!) 86/50  Pulse: 73   Temp: 98.2 F (36.8 C)   TempSrc: Temporal   SpO2: 98%   Weight: 136 lb 9.6 oz (62 kg)   Height: $Remove'5\' 6"'DKaclkl$  (1.676 m)      Physical Exam Vitals reviewed.  Constitutional:      General: He is not in acute distress.    Appearance: Normal appearance. He is well-developed. He is not ill-appearing, toxic-appearing or diaphoretic.  HENT:     Head: Normocephalic and atraumatic.  Eyes:     Pupils: Pupils are equal, round, and reactive to light.  Neck:     Vascular: No carotid bruit or JVD.  Cardiovascular:     Rate and Rhythm: Normal rate and regular rhythm.     Heart sounds: Normal heart sounds. No murmur heard.      Comments: Reg rate and rhythm.  Pulmonary:     Effort: Pulmonary effort is normal.  Breath sounds: Normal breath sounds. No rales.  Abdominal:     General: Bowel sounds are normal. There is no distension.     Tenderness: There is no abdominal tenderness. There is no guarding.  Skin:    General: Skin is warm and dry.  Neurological:     Mental Status:  He is alert and oriented to person, place, and time.  Psychiatric:        Mood and Affect: Mood normal.        Behavior: Behavior normal.     3:22 PM Repeat blood pressure right arm, 130/60.  Left arm 120/58.    Assessment & Plan:  Adam Reid is a 74 y.o. male . Type 2 diabetes mellitus with chronic kidney disease, without long-term current use of insulin, unspecified CKD stage ([place])  -Tolerating restart of Januvia, continue same, and continue same dose glipizide for now.  Recheck A1c in 2 months.  RTC precautions if new side effects/symptoms prior.  Depression, unspecified depression type  -Improved, no med changes  Abdominal bloating  -Abdominal exam reassuring.  Minimize carbonated beverages, handout on FODMAPs for other possible dietary causes, RTC precautions.  Hypotension, unspecified hypotension type  -On initial evaluation/triage, improved on repeat testing.  Maintenance of hydration discussed.  RTC precautions/ER precautions.  No orders of the defined types were placed in this encounter.  Patient Instructions     Cut back on carbonated beverages, see list of F[place]maps - foods that can contribute to bloating. If those symptoms persist, return to discuss further.  Return to the clinic or go to the nearest emergency room if any of your symptoms worsen or new symptoms occur.  No change in diabetes meds for now. Recheck in 2 months.   Repeat blood pressure looks okay today.  If any low blood pressures at home, lightheadedness, or dizziness, be seen.  Follow-up in 1 to 2 months for repeat testing.  Return to the clinic or go to the nearest emergency room if any of your symptoms worsen or new symptoms occur.     If you have lab work done today you will be contacted with your lab results within the next 2 weeks.  If you have not heard from [place] then please contact us. The fastest way to get your results is to register for My Chart.   IF you received an x-ray today,  you will receive an invoice from [facility]. Please contact [facility] at [phone] with questions or concerns regarding your invoice.   IF you received labwork today, you will receive an invoice from [facility]. Please contact [facility] at [phone] with questions or concerns regarding your invoice.   Our billing staff will not be able to assist you with questions regarding bills from these companies.  You will be contacted with the lab results as soon as they are available. The fastest way to get your results is to activate your My Chart account. Instructions are located on the last page of this paperwork. If you have not heard from [place] regarding the results in 2 weeks, please contact this office.          Signed, Merri Ray, MD [facility]

## 2020-04-09 ENCOUNTER — Encounter: Payer: Self-pay | Admitting: Family Medicine

## 2020-04-11 ENCOUNTER — Other Ambulatory Visit: Payer: Self-pay

## 2020-04-11 ENCOUNTER — Encounter (HOSPITAL_COMMUNITY)
Admission: RE | Admit: 2020-04-11 | Discharge: 2020-04-11 | Disposition: A | Discharge status: Home / Self Care | Payer: Medicare Other | Source: Ambulatory Visit | Attending: Nephrology | Admitting: Nephrology

## 2020-04-11 VITALS — BP 146/81 | HR 76

## 2020-04-11 DIAGNOSIS — D631 Anemia in chronic kidney disease: Secondary | ICD-10-CM | POA: Insufficient documentation

## 2020-04-11 DIAGNOSIS — N184 Chronic kidney disease, stage 4 (severe): Secondary | ICD-10-CM | POA: Insufficient documentation

## 2020-04-11 DIAGNOSIS — N179 Acute kidney failure, unspecified: Secondary | ICD-10-CM

## 2020-04-11 LAB — FERRITIN: Ferritin: 487 ng/mL — ABNORMAL HIGH (ref 24–336)

## 2020-04-11 LAB — POCT HEMOGLOBIN-HEMACUE: Hemoglobin: 12.6 g/dL — ABNORMAL LOW (ref 13.0–17.0)

## 2020-04-11 LAB — IRON AND TIBC
Iron: 67 ug/dL (ref 45–182)
Saturation Ratios: 23 % (ref 17.9–39.5)
TIBC: 286 ug/dL (ref 250–450)
UIBC: 219 ug/dL

## 2020-04-11 MED ORDER — EPOETIN ALFA-EPBX 10000 UNIT/ML IJ SOLN: 10000.0000 [IU] | INTRAMUSCULAR | Status: DC

## 2020-04-22 ENCOUNTER — Other Ambulatory Visit: Payer: Self-pay | Admitting: Family Medicine

## 2020-04-22 DIAGNOSIS — E1122 Type 2 diabetes mellitus with diabetic chronic kidney disease: Secondary | ICD-10-CM

## 2020-04-22 NOTE — Telephone Encounter (Signed)
Requested Prescriptions  Pending Prescriptions Disp Refills  . glipiZIDE (GLUCOTROL XL) 5 MG 24 hr tablet [Pharmacy Med Name: GLIPIZIDE ER 5MG  TABLETS] 30 tablet 1    Sig: TAKE 1 TABLET(5 MG) BY MOUTH DAILY WITH BREAKFAST     Endocrinology:  Diabetes - Sulfonylureas Passed - 04/22/2020  3:15 AM      Passed - HBA1C is between 0 and 7.9 and within 180 days    Hgb A1c MFr Bld  Date Value Ref Range Status  03/07/2020 7.3 (H) 4.8 - 5.6 % Final    Comment:             Prediabetes: 5.7 - 6.4          Diabetes: >6.4          Glycemic control for adults with diabetes: <7.0          Passed - Valid encounter within last 6 months    Recent Outpatient Visits          2 weeks ago Type 2 diabetes mellitus with chronic kidney disease, without long-term current use of insulin, unspecified CKD stage [facility])   [facility], Ranell Patrick, MD   1 month ago Fall in home, initial encounter   [facility], Ranell Patrick, MD   3 months ago Encounter for PPD skin test reading   [facility], Ranell Patrick, MD   3 months ago Visit for TB skin test   [facility], Ranell Patrick, MD   3 months ago Type 2 diabetes mellitus with chronic kidney disease, without long-term current use of insulin, unspecified CKD stage [facility])   [facility], Ranell Patrick, MD      Future Appointments            In 1 month [name] Ranell Patrick, MD [facility]

## 2020-04-25 ENCOUNTER — Other Ambulatory Visit: Payer: Self-pay

## 2020-04-25 ENCOUNTER — Encounter (HOSPITAL_COMMUNITY)
Admission: RE | Admit: 2020-04-25 | Discharge: 2020-04-25 | Disposition: A | Discharge status: Home / Self Care | Payer: Medicare Other | Source: Ambulatory Visit | Attending: Nephrology | Admitting: Nephrology

## 2020-04-25 VITALS — BP 106/66 | HR 80 | Temp 98.3°F | Resp 20

## 2020-04-25 DIAGNOSIS — N184 Chronic kidney disease, stage 4 (severe): Secondary | ICD-10-CM | POA: Diagnosis not present

## 2020-04-25 DIAGNOSIS — N179 Acute kidney failure, unspecified: Secondary | ICD-10-CM

## 2020-04-25 LAB — POCT HEMOGLOBIN-HEMACUE: Hemoglobin: 11.5 g/dL — ABNORMAL LOW (ref 13.0–17.0)

## 2020-04-25 MED ORDER — EPOETIN ALFA-EPBX 10000 UNIT/ML IJ SOLN
10000.0000 [IU] | INTRAMUSCULAR | Status: DC
  Administered 2020-04-25: 10000 [IU] via SUBCUTANEOUS

## 2020-04-25 MED ORDER — EPOETIN ALFA-EPBX 10000 UNIT/ML IJ SOLN
INTRAMUSCULAR | Status: AC
  Filled 2020-04-25: qty 1

## 2020-05-09 ENCOUNTER — Encounter (HOSPITAL_COMMUNITY): Payer: Medicare Other

## 2020-05-23 ENCOUNTER — Encounter (HOSPITAL_COMMUNITY): Payer: Medicare Other

## 2020-05-23 ENCOUNTER — Other Ambulatory Visit: Payer: Self-pay

## 2020-05-23 ENCOUNTER — Ambulatory Visit (HOSPITAL_COMMUNITY)
Admission: RE | Admit: 2020-05-23 | Discharge: 2020-05-23 | Disposition: A | Discharge status: Home / Self Care | Payer: Medicare Other | Source: Ambulatory Visit | Attending: Nephrology | Admitting: Nephrology

## 2020-05-23 VITALS — BP 132/73 | HR 83 | Temp 97.8°F | Resp 20

## 2020-05-23 DIAGNOSIS — N184 Chronic kidney disease, stage 4 (severe): Secondary | ICD-10-CM | POA: Insufficient documentation

## 2020-05-23 DIAGNOSIS — D649 Anemia, unspecified: Secondary | ICD-10-CM | POA: Insufficient documentation

## 2020-05-23 DIAGNOSIS — N179 Acute kidney failure, unspecified: Secondary | ICD-10-CM | POA: Diagnosis present

## 2020-05-23 DIAGNOSIS — D631 Anemia in chronic kidney disease: Secondary | ICD-10-CM | POA: Insufficient documentation

## 2020-05-23 LAB — POCT HEMOGLOBIN-HEMACUE: Hemoglobin: 11.4 g/dL — ABNORMAL LOW (ref 13.0–17.0)

## 2020-05-23 LAB — IRON AND TIBC
Iron: 44 ug/dL — ABNORMAL LOW (ref 45–182)
Saturation Ratios: 16 % — ABNORMAL LOW (ref 17.9–39.5)
TIBC: 276 ug/dL (ref 250–450)
UIBC: 232 ug/dL

## 2020-05-23 LAB — FERRITIN: Ferritin: 470 ng/mL — ABNORMAL HIGH (ref 24–336)

## 2020-05-23 MED ORDER — EPOETIN ALFA-EPBX 10000 UNIT/ML IJ SOLN
INTRAMUSCULAR | Status: AC
  Administered 2020-05-23: 10000 [IU] via SUBCUTANEOUS
  Filled 2020-05-23: qty 1

## 2020-05-23 MED ORDER — EPOETIN ALFA-EPBX 10000 UNIT/ML IJ SOLN: 10000.0000 [IU] | INTRAMUSCULAR | Status: DC

## 2020-06-09 ENCOUNTER — Other Ambulatory Visit: Payer: Self-pay | Admitting: Family Medicine

## 2020-06-09 DIAGNOSIS — E1122 Type 2 diabetes mellitus with diabetic chronic kidney disease: Secondary | ICD-10-CM

## 2020-06-10 ENCOUNTER — Other Ambulatory Visit: Payer: Self-pay

## 2020-06-19 ENCOUNTER — Other Ambulatory Visit: Payer: Self-pay

## 2020-06-19 ENCOUNTER — Encounter: Payer: Self-pay | Admitting: Family Medicine

## 2020-06-19 ENCOUNTER — Ambulatory Visit (INDEPENDENT_AMBULATORY_CARE_PROVIDER_SITE_OTHER): Payer: Medicare Other | Admitting: Family Medicine

## 2020-06-19 VITALS — BP 138/74 | HR 71 | Temp 98.6°F | Ht 66.0 in | Wt 137.0 lb

## 2020-06-19 DIAGNOSIS — E1122 Type 2 diabetes mellitus with diabetic chronic kidney disease: Secondary | ICD-10-CM | POA: Diagnosis not present

## 2020-06-19 DIAGNOSIS — R7989 Other specified abnormal findings of blood chemistry: Secondary | ICD-10-CM

## 2020-06-19 DIAGNOSIS — E781 Pure hyperglyceridemia: Secondary | ICD-10-CM | POA: Diagnosis not present

## 2020-06-19 DIAGNOSIS — E785 Hyperlipidemia, unspecified: Secondary | ICD-10-CM

## 2020-06-19 DIAGNOSIS — I2510 Atherosclerotic heart disease of native coronary artery without angina pectoris: Secondary | ICD-10-CM

## 2020-06-19 DIAGNOSIS — M549 Dorsalgia, unspecified: Secondary | ICD-10-CM

## 2020-06-19 DIAGNOSIS — R918 Other nonspecific abnormal finding of lung field: Secondary | ICD-10-CM

## 2020-06-19 LAB — POCT GLYCOSYLATED HEMOGLOBIN (HGB A1C): Hemoglobin A1C: 6.7 % — AB (ref 4.0–5.6)

## 2020-06-19 LAB — GLUCOSE, POCT (MANUAL RESULT ENTRY): POC Glucose: 166 mg/dl — AB (ref 70–99)

## 2020-06-19 MED ORDER — DAPAGLIFLOZIN PROPANEDIOL 5 MG PO TABS: 5.0000 mg | ORAL_TABLET | Freq: Every day | ORAL | 5 refills | Status: DC

## 2020-06-19 MED ORDER — GLIPIZIDE ER 5 MG PO TB24: ORAL_TABLET | ORAL | 1 refills | Status: DC

## 2020-06-19 MED ORDER — ATORVASTATIN CALCIUM 80 MG PO TABS: ORAL_TABLET | ORAL | 1 refills | Status: DC

## 2020-06-19 MED ORDER — SITAGLIPTIN PHOSPHATE 25 MG PO TABS: ORAL_TABLET | ORAL | 1 refills | Status: DC

## 2020-06-19 NOTE — Progress Notes (Signed)
Subjective:  Patient ID: Adam Reid, male    DOB: 11/15/1945  Age: 74 y.o. MRN: [id number]  CC:  Chief Complaint  Patient presents with  . Follow-up    On diabetes. PT reports that once since last OV the pt's BS dropped to 47 mg/dl. Pt was doing work around his home this day.   . Back Pain    Pain started a month ago. No known trama to the area. Pain is located on the L side of the pts upper back.    HPI Dyquan Minks presents for   Diabetes: Complicated by CKD - followed by nephrology, microalbuminuria, CHF, prior hypoglycemia. Actos previously discontinued due to CHF. Glipizide 5 mg daily, and had restarted Januvia for elevated home readings at his September visit. also on Farxiga 5 mg daily Reports episode of hypoglycemia of blood sugar 47 - few days ago.  he had been doing work around his home that day. Felt tired, no syncope. Treated with fruit, felt normal.  No other lows. No skipped meals.  He is on statin. No ACE with CKD. Fasting today.  Home readings: Fasting: 140-150 2hr postprandial: none.     Lab Results  Component Value Date   HGBA1C 6.7 (A) 06/19/2020   HGBA1C 7.3 (H) 03/07/2020   HGBA1C 6.9 (H) 11/30/2019   Lab Results  Component Value Date   MICROALBUR 60.3 09/02/2015   LDLCALC 57 07/20/2019   CREATININE 3.40 (H) 03/28/2020   Elevated TSH: 4.7 6 months ago, 5.4 on recheck and 4.6 3 months ago, borderline t3, normal t4.   L upper back pain: No fall/injury. Noticed about a month ago.  No neck pain.  Better now, but still occasional soreness.  Tx: none.  No new cough, no hemoptysis, no dyspnea.  No chest pain or arm pain. No fever, feels ok. Improving.   History Patient Active Problem List   Diagnosis Date Noted  . Acute CHF (congestive heart failure) ([place]) 09/24/2019  . ARF (acute renal failure) ([place]) 09/24/2019  . Anemia 09/24/2019  . Acute respiratory failure with hypoxia ([place])   . Secondary hyperparathyroidism, renal ([place])  12/29/2016  . Smoker 03/26/2016  . Hypotension 01/29/2016  . Abnormal myocardial perfusion study 01/29/2016  . Depression 12/09/2015  . Hyperlipidemia LDL goal <100 06/28/2014  . Diabetes mellitus with renal manifestations, controlled ([place])   . HTN (hypertension) 06/24/2014  . 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 BPH (benign prostatic hyperplasia) 06/24/2014   Past Medical History:  Diagnosis Date  . Anemia   . CHF (congestive heart failure) ([place])   . Chronic kidney disease   . Depression   . Diabetes mellitus without complication ([place])   . Hyperlipidemia   . Hypertension   . Thyroid disease    was on supplement, taken off by [name]   Past Surgical History:  Procedure Laterality Date  . APPENDECTOMY    . CARDIAC CATHETERIZATION N/A 01/30/2016   Procedure: Left Heart Cath and Coronary Angiography;  Surgeon: [name], MD;  Location: [facility];  Service: Cardiovascular;  Laterality: N/A;   No Known Allergies Prior to Admission medications   Medication Sig Start Date End Date Taking? Authorizing Provider  amiodarone (PACERONE) 200 MG tablet Take 1 tablet (200 mg total) by mouth daily. 11/27/19   Clegg, Amy D, NP  apixaban (ELIQUIS) 2.5 MG TABS tablet Take 1 tablet (2.5 mg total) by mouth 2 (two) times daily.  03/07/20   Bensimhon, Shaune Pascal, MD  atorvastatin (LIPITOR) 80 MG tablet TAKE 1 TABLET(80 MG) BY MOUTH DAILY 01/08/20   [name], MD  blood glucose meter kit and supplies Use up to two times daily as directed  ICD10 E10.9 E11.9 02/20/19   [name], MD  Blood Glucose Monitoring Suppl (ONETOUCH VERIO) w/Device KIT as directed.  09/03/15   [provider]  cyanocobalamin 500 MCG tablet Take 500 mcg by mouth daily. Reported on 12/02/2015    [provider]  dapagliflozin propanediol (FARXIGA) 5 MG TABS tablet Take 1 tablet (5 mg total) by mouth daily. 03/07/20   Bensimhon, Shaune Pascal, MD  diltiazem (CARDIZEM CD) 180 MG 24 hr capsule TAKE 1 CAPSULE(180 MG) BY MOUTH DAILY 03/20/20   Bensimhon, Shaune Pascal, MD  glipiZIDE (GLUCOTROL XL) 5 MG 24 hr tablet TAKE 1 TABLET(5 MG) BY MOUTH DAILY WITH BREAKFAST 04/22/20   [name], MD  GlucoCom Lancets MISC Use for home glucose monitoring 09/02/15   Harrison Mons, PA  glucose blood (ONETOUCH VERIO) test strip Test up to 2 times per day.  Uncontrolled diabetes with hyperglycemia and stage 4 CKD. 04/13/19   [name], MD  Omega 3-6-9 Fatty Acids (OMEGA 3-6-9 COMPLEX PO) Take 1 tablet by mouth daily.    [provider]  pantoprazole (PROTONIX) 40 MG tablet Take 1 tablet (40 mg total) by mouth daily. 12/25/19   Levin Erp, PA  polyethylene glycol (MIRALAX / GLYCOLAX) 17 g packet Take 17 g by mouth daily as needed for mild constipation or moderate constipation.     [provider]  senna (SENOKOT) 8.6 MG tablet Take 1 tablet by mouth as needed for constipation.     [provider]  sitaGLIPtin (JANUVIA) 25 MG tablet TAKE 1 TABLET(25 MG) BY MOUTH DAILY 06/10/20   [name], MD  tamsulosin (FLOMAX) 0.4 MG CAPS capsule TAKE 2 CAPSULES(0.8 MG) BY MOUTH DAILY 01/08/20   [name], MD  torsemide (DEMADEX) 20 MG tablet Take 1 tablet (20 mg total) by mouth as needed. 03/07/20   Larey Dresser, MD  citalopram (CELEXA) 20 MG tablet TAKE 1 TABLET(20 MG) BY MOUTH DAILY 01/08/20 03/07/20  [name], MD   Social History   Socioeconomic History  . Marital status: Married    Spouse name: [name]  . Number of children: 1  . Years of education: 12th grade  . Highest education level: Not on file  Occupational History  . Occupation: Retired-accountant  Tobacco Use  . Smoking status: Light Tobacco Smoker    Packs/day: 0.10    Years: 48.00    Pack years: 4.80    Types: Cigarettes    Last attempt to quit: 07/16/2019    Years since quitting: 0.9  . Smokeless tobacco: Never Used  .  Tobacco comment: per pt he has one cigarette every couple days  Vaping Use  . Vaping Use: Never used  Substance and Sexual Activity  . Alcohol use: No    Alcohol/week: 0.0 standard drinks  . Drug use: No  . Sexual activity: Never    Partners: Female  Other Topics Concern  . Not on file  Social History Narrative   Originally from [place]. Came to the [place] in 2009, following their son who came here for school.   Married.   Lives with his wife.   Their adult son lives in [place], [place], where he is in optometry school.  Education: [employer]   Exercise: No   Social Determinants of Health   Financial Resource Strain: Not on file  Food Insecurity: Not on file  Transportation Needs: Not on file  Physical Activity: Not on file  Stress: Not on file  Social Connections: Not on file  Intimate Partner Violence: Not on file    Review of Systems   Objective:   Vitals:   06/19/20 0806 06/19/20 0809  BP: (!) 164/78 138/74  Pulse: 71   Temp: 98.6 F (37 C)   TempSrc: Temporal   SpO2: 98%   Weight: 137 lb (62.1 kg)   Height: _0  (1.676 m)      Physical Exam Vitals reviewed.  Constitutional:      Appearance: He is well-developed and well-nourished.  HENT:     Head: Normocephalic and atraumatic.  Eyes:     Extraocular Movements: EOM normal.     Pupils: Pupils are equal, round, and reactive to light.  Neck:     Vascular: No carotid bruit or JVD.  Cardiovascular:     Rate and Rhythm: Normal rate and regular rhythm.     Heart sounds: Normal heart sounds. No murmur heard.   Pulmonary:     Effort: Pulmonary effort is normal.     Breath sounds: Rhonchi (high pitch rhonchorous sound LUL, no focal ttp over Left uppper back, but describes area of soreness dear upper trapezius - unable to clarify if deeper pain or surface, nontender on exam. ) present. No rales.  Musculoskeletal:        General: No tenderness or edema.     Right lower leg: No edema.     Left lower leg: No edema.      Comments: Tspine, Cspine - no bony ttp.   Skin:    General: Skin is warm and dry.  Neurological:     Mental Status: He is alert and oriented to person, place, and time.  Psychiatric:        Mood and Affect: Mood and affect normal.     Results for orders placed or performed in visit on 06/19/20  POCT glycosylated hemoglobin (Hb A1C)  Result Value Ref Range   Hemoglobin A1C 6.7 (A) 4.0 - 5.6 %   HbA1c POC (<> result, manual entry)     HbA1c, POC (prediabetic range)     HbA1c, POC (controlled diabetic range)    POCT glucose (manual entry)  Result Value Ref Range   POC Glucose 166 (A) 70 - 99 mg/dl      Assessment & Plan:  Leaf Kernodle is a 74 y.o. male . Type 2 diabetes mellitus with chronic kidney disease, without long-term current use of insulin, unspecified CKD stage ([place]) - Plan: POCT glycosylated hemoglobin (Hb A1C), POCT glucose (manual entry), Microalbumin / creatinine urine ratio  -Improved control by A1c.  Single episode of hypoglycemia likely related to intake that day as well as increased activity.  Dietary guidance discussed as well as hypoglycemia precautions.  Continue same regimen but if further lows discontinue glipizide or decrease in half at minimum.  Recheck 3 months.  Coronary artery disease involving native heart without angina pectoris, unspecified vessel or lesion type - Plan: Comprehensive metabolic panel, Lipid panel Hypertriglyceridemia - Plan: Comprehensive metabolic panel, Lipid panel  -Asymptomatic, continue statin.  Check labs  TSH elevation - Plan: TSH  -Borderline elevated previously with normal free T4, borderline T3.  Repeat TSH.  Lung field abnormal finding on examination - Plan: DG Chest  2 View, CANCELED: DG Chest 2 View Upper back pain on left side - Plan: DG Chest 2 View, CANCELED: DG Chest 2 View  -Some difficulty with history, not sure if it is more muscular or internal.  No focal tenderness on exam, but location of discomfort  appears to have been more at the rhomboid or upper trapezius.  However I do hear some abnormal lung sounds in that area and will check chest x-ray.  Symptoms are improving, RTC precautions if not resolved in the next week or 2.  No orders of the defined types were placed in this encounter.  Patient Instructions     Make sure to eat sufficient calories including snack if needed when you are more active to prevent further drops in blood sugar.  A1c is controlled today.  If any further low blood sugars would recommend you stop glipizide or at the minimum decrease it to 1/2 pill.  Let me know if that occurs.    I will let you know if there are any concerns on x-ray, but if pain does not resolve in the next week or 2, please follow-up to recheck.  Follow-up sooner if any worsening or new symptoms.  Thank you for coming in today.  If you have lab work done today you will be contacted with your lab results within the next 2 weeks.  If you have not heard from [place] then please contact us. The fastest way to get your results is to register for My Chart.   IF you received an x-ray today, you will receive an invoice from [facility]. Please contact [facility] at [phone] with questions or concerns regarding your invoice.   IF you received labwork today, you will receive an invoice from [facility]. Please contact [facility] at [phone] with questions or concerns regarding your invoice.   Our billing staff will not be able to assist you with questions regarding bills from these companies.  You will be contacted with the lab results as soon as they are available. The fastest way to get your results is to activate your My Chart account. Instructions are located on the last page of this paperwork. If you have not heard from [place] regarding the results in 2 weeks, please contact this office.         Signed, Merri Ray, MD [facility]

## 2020-06-19 NOTE — Patient Instructions (Addendum)
   Make sure to eat sufficient calories including snack if needed when you are more active to prevent further drops in blood sugar.  A1c is controlled today.  If any further low blood sugars would recommend you stop glipizide or at the minimum decrease it to 1/2 pill.  Let me know if that occurs.    I will let you know if there are any concerns on x-ray, but if pain does not resolve in the next week or 2, please follow-up to recheck.  Follow-up sooner if any worsening or new symptoms.  Thank you for coming in today.  If you have lab work done today you will be contacted with your lab results within the next 2 weeks.  If you have not heard from [place] then please contact us. The fastest way to get your results is to register for My Chart.   IF you received an x-ray today, you will receive an invoice from [facility]. Please contact [facility] at [phone] with questions or concerns regarding your invoice.   IF you received labwork today, you will receive an invoice from [facility]. Please contact [facility] at [phone] with questions or concerns regarding your invoice.   Our billing staff will not be able to assist you with questions regarding bills from these companies.  You will be contacted with the lab results as soon as they are available. The fastest way to get your results is to activate your My Chart account. Instructions are located on the last page of this paperwork. If you have not heard from [place] regarding the results in 2 weeks, please contact this office.

## 2020-06-20 ENCOUNTER — Ambulatory Visit: Payer: Medicare Other | Admitting: Family Medicine

## 2020-06-20 ENCOUNTER — Encounter (HOSPITAL_COMMUNITY)
Admission: RE | Admit: 2020-06-20 | Discharge: 2020-06-20 | Disposition: A | Discharge status: Home / Self Care | Payer: Medicare Other | Source: Ambulatory Visit | Attending: Nephrology | Admitting: Nephrology

## 2020-06-20 VITALS — BP 146/70 | HR 78 | Temp 98.3°F | Resp 20

## 2020-06-20 DIAGNOSIS — D631 Anemia in chronic kidney disease: Secondary | ICD-10-CM | POA: Diagnosis not present

## 2020-06-20 DIAGNOSIS — N184 Chronic kidney disease, stage 4 (severe): Secondary | ICD-10-CM | POA: Insufficient documentation

## 2020-06-20 DIAGNOSIS — N179 Acute kidney failure, unspecified: Secondary | ICD-10-CM

## 2020-06-20 LAB — COMPREHENSIVE METABOLIC PANEL
ALT: 11 IU/L (ref 0–44)
AST: 11 IU/L (ref 0–40)
Albumin/Globulin Ratio: 1.4 (ref 1.2–2.2)
Albumin: 4.4 g/dL (ref 3.7–4.7)
Alkaline Phosphatase: 121 IU/L (ref 44–121)
BUN/Creatinine Ratio: 16 (ref 10–24)
BUN: 51 mg/dL — ABNORMAL HIGH (ref 8–27)
Bilirubin Total: 0.2 mg/dL (ref 0.0–1.2)
CO2: 18 mmol/L — ABNORMAL LOW (ref 20–29)
Calcium: 8.9 mg/dL (ref 8.6–10.2)
Chloride: 101 mmol/L (ref 96–106)
Creatinine, Ser: 3.16 mg/dL (ref 0.76–1.27)
GFR calc Af Amer: 21 mL/min/{1.73_m2} — ABNORMAL LOW (ref >59)
GFR calc non Af Amer: 18 mL/min/{1.73_m2} — ABNORMAL LOW (ref >59)
Globulin, Total: 3.2 g/dL (ref 1.5–4.5)
Glucose: 155 mg/dL — ABNORMAL HIGH (ref 65–99)
Potassium: 4.2 mmol/L (ref 3.5–5.2)
Sodium: 137 mmol/L (ref 134–144)
Total Protein: 7.6 g/dL (ref 6.0–8.5)

## 2020-06-20 LAB — POCT HEMOGLOBIN-HEMACUE: Hemoglobin: 11.1 g/dL — ABNORMAL LOW (ref 13.0–17.0)

## 2020-06-20 LAB — LIPID PANEL
Chol/HDL Ratio: 1.8 ratio (ref 0.0–5.0)
Cholesterol, Total: 191 mg/dL (ref 100–199)
HDL: 107 mg/dL (ref >39)
LDL Chol Calc (NIH): 73 mg/dL (ref 0–99)
Triglycerides: 61 mg/dL (ref 0–149)
VLDL Cholesterol Cal: 11 mg/dL (ref 5–40)

## 2020-06-20 LAB — MICROALBUMIN / CREATININE URINE RATIO
Creatinine, Urine: 54.3 mg/dL
Microalb/Creat Ratio: 1107 mg/g creat — ABNORMAL HIGH (ref 0–29)
Microalbumin, Urine: 601 ug/mL

## 2020-06-20 LAB — IRON AND TIBC
Iron: 56 ug/dL (ref 45–182)
Saturation Ratios: 22 % (ref 17.9–39.5)
TIBC: 256 ug/dL (ref 250–450)
UIBC: 200 ug/dL

## 2020-06-20 LAB — TSH: TSH: 4.72 u[IU]/mL — ABNORMAL HIGH (ref 0.450–4.500)

## 2020-06-20 LAB — FERRITIN: Ferritin: 487 ng/mL — ABNORMAL HIGH (ref 24–336)

## 2020-06-20 MED ORDER — EPOETIN ALFA-EPBX 10000 UNIT/ML IJ SOLN
10000.0000 [IU] | INTRAMUSCULAR | Status: DC
  Administered 2020-06-20: 10000 [IU] via SUBCUTANEOUS

## 2020-06-20 MED ORDER — EPOETIN ALFA-EPBX 10000 UNIT/ML IJ SOLN
INTRAMUSCULAR | Status: AC
  Filled 2020-06-20: qty 1

## 2020-06-24 ENCOUNTER — Ambulatory Visit
Admission: RE | Admit: 2020-06-24 | Discharge: 2020-06-24 | Disposition: A | Discharge status: Home / Self Care | Payer: Medicare Other | Source: Ambulatory Visit | Attending: Family Medicine | Admitting: Family Medicine

## 2020-06-24 ENCOUNTER — Other Ambulatory Visit: Payer: Self-pay

## 2020-06-24 DIAGNOSIS — M549 Dorsalgia, unspecified: Secondary | ICD-10-CM

## 2020-06-24 DIAGNOSIS — R918 Other nonspecific abnormal finding of lung field: Secondary | ICD-10-CM

## 2020-07-18 ENCOUNTER — Other Ambulatory Visit: Payer: Self-pay

## 2020-07-18 ENCOUNTER — Ambulatory Visit (HOSPITAL_COMMUNITY)
Admission: RE | Admit: 2020-07-18 | Discharge: 2020-07-18 | Disposition: A | Discharge status: Home / Self Care | Payer: Medicare Other | Source: Ambulatory Visit | Attending: Nephrology | Admitting: Nephrology

## 2020-07-18 VITALS — BP 114/66 | HR 71 | Temp 97.9°F | Resp 20

## 2020-07-18 DIAGNOSIS — N179 Acute kidney failure, unspecified: Secondary | ICD-10-CM

## 2020-07-18 DIAGNOSIS — N184 Chronic kidney disease, stage 4 (severe): Secondary | ICD-10-CM | POA: Diagnosis present

## 2020-07-18 DIAGNOSIS — D631 Anemia in chronic kidney disease: Secondary | ICD-10-CM | POA: Insufficient documentation

## 2020-07-18 LAB — IRON AND TIBC
Iron: 56 ug/dL (ref 45–182)
Saturation Ratios: 21 % (ref 17.9–39.5)
TIBC: 265 ug/dL (ref 250–450)
UIBC: 209 ug/dL

## 2020-07-18 LAB — FERRITIN: Ferritin: 531 ng/mL — ABNORMAL HIGH (ref 24–336)

## 2020-07-18 LAB — POCT HEMOGLOBIN-HEMACUE: Hemoglobin: 10.9 g/dL — ABNORMAL LOW (ref 13.0–17.0)

## 2020-07-18 MED ORDER — EPOETIN ALFA-EPBX 10000 UNIT/ML IJ SOLN
INTRAMUSCULAR | Status: AC
  Administered 2020-07-18: 10000 [IU] via SUBCUTANEOUS
  Filled 2020-07-18: qty 1

## 2020-07-18 MED ORDER — EPOETIN ALFA-EPBX 10000 UNIT/ML IJ SOLN: 10000.0000 [IU] | INTRAMUSCULAR | Status: DC

## 2020-07-27 ENCOUNTER — Other Ambulatory Visit: Payer: Self-pay | Admitting: Family Medicine

## 2020-07-27 DIAGNOSIS — I10 Essential (primary) hypertension: Secondary | ICD-10-CM

## 2020-08-15 ENCOUNTER — Ambulatory Visit (HOSPITAL_COMMUNITY)
Admission: RE | Admit: 2020-08-15 | Discharge: 2020-08-15 | Disposition: A | Discharge status: Home / Self Care | Payer: Medicare Other | Source: Ambulatory Visit | Attending: Nephrology | Admitting: Nephrology

## 2020-08-15 ENCOUNTER — Other Ambulatory Visit: Payer: Self-pay

## 2020-08-15 VITALS — BP 139/76 | HR 88 | Temp 98.2°F | Resp 20

## 2020-08-15 DIAGNOSIS — N184 Chronic kidney disease, stage 4 (severe): Secondary | ICD-10-CM | POA: Diagnosis present

## 2020-08-15 DIAGNOSIS — D631 Anemia in chronic kidney disease: Secondary | ICD-10-CM | POA: Insufficient documentation

## 2020-08-15 DIAGNOSIS — N179 Acute kidney failure, unspecified: Secondary | ICD-10-CM

## 2020-08-15 LAB — IRON AND TIBC
Iron: 55 ug/dL (ref 45–182)
Saturation Ratios: 21 % (ref 17.9–39.5)
TIBC: 258 ug/dL (ref 250–450)
UIBC: 203 ug/dL

## 2020-08-15 LAB — RENAL FUNCTION PANEL
Albumin: 3.9 g/dL (ref 3.5–5.0)
Anion gap: 13 (ref 5–15)
BUN: 57 mg/dL — ABNORMAL HIGH (ref 8–23)
CO2: 21 mmol/L — ABNORMAL LOW (ref 22–32)
Calcium: 9 mg/dL (ref 8.9–10.3)
Chloride: 105 mmol/L (ref 98–111)
Creatinine, Ser: 3.31 mg/dL — ABNORMAL HIGH (ref 0.61–1.24)
GFR, Estimated: 19 mL/min — ABNORMAL LOW (ref >60)
Glucose, Bld: 188 mg/dL — ABNORMAL HIGH (ref 70–99)
Phosphorus: 5.1 mg/dL — ABNORMAL HIGH (ref 2.5–4.6)
Potassium: 3.9 mmol/L (ref 3.5–5.1)
Sodium: 139 mmol/L (ref 135–145)

## 2020-08-15 LAB — POCT HEMOGLOBIN-HEMACUE: Hemoglobin: 11.9 g/dL — ABNORMAL LOW (ref 13.0–17.0)

## 2020-08-15 LAB — MAGNESIUM: Magnesium: 2.4 mg/dL (ref 1.7–2.4)

## 2020-08-15 LAB — FERRITIN: Ferritin: 452 ng/mL — ABNORMAL HIGH (ref 24–336)

## 2020-08-15 MED ORDER — EPOETIN ALFA-EPBX 10000 UNIT/ML IJ SOLN
10000.0000 [IU] | INTRAMUSCULAR | Status: DC
  Administered 2020-08-15: 10000 [IU] via SUBCUTANEOUS

## 2020-08-15 MED ORDER — EPOETIN ALFA-EPBX 10000 UNIT/ML IJ SOLN
INTRAMUSCULAR | Status: AC
  Filled 2020-08-15: qty 1

## 2020-09-05 ENCOUNTER — Other Ambulatory Visit: Payer: Self-pay | Admitting: Family Medicine

## 2020-09-05 DIAGNOSIS — E1122 Type 2 diabetes mellitus with diabetic chronic kidney disease: Secondary | ICD-10-CM

## 2020-09-08 ENCOUNTER — Other Ambulatory Visit: Payer: Self-pay

## 2020-09-08 DIAGNOSIS — E1122 Type 2 diabetes mellitus with diabetic chronic kidney disease: Secondary | ICD-10-CM

## 2020-09-08 MED ORDER — DAPAGLIFLOZIN PROPANEDIOL 5 MG PO TABS: 5.0000 mg | ORAL_TABLET | Freq: Every day | ORAL | 5 refills | Status: DC

## 2020-09-08 NOTE — Telephone Encounter (Signed)
This is a CHF pt, Dr. Bensimhon 

## 2020-09-09 ENCOUNTER — Other Ambulatory Visit (HOSPITAL_COMMUNITY): Payer: Self-pay

## 2020-09-09 DIAGNOSIS — E1122 Type 2 diabetes mellitus with diabetic chronic kidney disease: Secondary | ICD-10-CM

## 2020-09-09 MED ORDER — DAPAGLIFLOZIN PROPANEDIOL 5 MG PO TABS: 5.0000 mg | ORAL_TABLET | Freq: Every day | ORAL | 5 refills | Status: DC

## 2020-09-12 ENCOUNTER — Ambulatory Visit (HOSPITAL_COMMUNITY)
Admission: RE | Admit: 2020-09-12 | Discharge: 2020-09-12 | Disposition: A | Discharge status: Home / Self Care | Payer: Medicare Other | Source: Ambulatory Visit | Attending: Nephrology | Admitting: Nephrology

## 2020-09-12 ENCOUNTER — Other Ambulatory Visit: Payer: Self-pay

## 2020-09-12 VITALS — BP 157/75 | HR 85 | Temp 97.9°F | Resp 20

## 2020-09-12 DIAGNOSIS — D631 Anemia in chronic kidney disease: Secondary | ICD-10-CM | POA: Diagnosis not present

## 2020-09-12 DIAGNOSIS — N179 Acute kidney failure, unspecified: Secondary | ICD-10-CM

## 2020-09-12 DIAGNOSIS — N184 Chronic kidney disease, stage 4 (severe): Secondary | ICD-10-CM | POA: Diagnosis not present

## 2020-09-12 LAB — IRON AND TIBC
Iron: 69 ug/dL (ref 45–182)
Saturation Ratios: 23 % (ref 17.9–39.5)
TIBC: 297 ug/dL (ref 250–450)
UIBC: 228 ug/dL

## 2020-09-12 LAB — FERRITIN: Ferritin: 466 ng/mL — ABNORMAL HIGH (ref 24–336)

## 2020-09-12 LAB — POCT HEMOGLOBIN-HEMACUE: Hemoglobin: 11.1 g/dL — ABNORMAL LOW (ref 13.0–17.0)

## 2020-09-12 MED ORDER — EPOETIN ALFA-EPBX 10000 UNIT/ML IJ SOLN
10000.0000 [IU] | INTRAMUSCULAR | Status: DC
Start: 2020-09-12 — End: 2020-09-13
  Administered 2020-09-12: 10000 [IU] via SUBCUTANEOUS

## 2020-09-12 MED ORDER — EPOETIN ALFA-EPBX 10000 UNIT/ML IJ SOLN
INTRAMUSCULAR | Status: AC
  Filled 2020-09-12: qty 1

## 2020-10-10 ENCOUNTER — Other Ambulatory Visit: Payer: Self-pay

## 2020-10-10 ENCOUNTER — Encounter (HOSPITAL_COMMUNITY)
Admission: RE | Admit: 2020-10-10 | Discharge: 2020-10-10 | Disposition: A | Discharge status: Home / Self Care | Payer: Medicare Other | Source: Ambulatory Visit | Attending: Nephrology | Admitting: Nephrology

## 2020-10-10 VITALS — BP 160/80 | HR 89

## 2020-10-10 DIAGNOSIS — D631 Anemia in chronic kidney disease: Secondary | ICD-10-CM | POA: Diagnosis not present

## 2020-10-10 DIAGNOSIS — N184 Chronic kidney disease, stage 4 (severe): Secondary | ICD-10-CM | POA: Diagnosis present

## 2020-10-10 DIAGNOSIS — N179 Acute kidney failure, unspecified: Secondary | ICD-10-CM

## 2020-10-10 LAB — FERRITIN: Ferritin: 464 ng/mL — ABNORMAL HIGH (ref 24–336)

## 2020-10-10 LAB — IRON AND TIBC
Iron: 43 ug/dL — ABNORMAL LOW (ref 45–182)
Saturation Ratios: 16 % — ABNORMAL LOW (ref 17.9–39.5)
TIBC: 266 ug/dL (ref 250–450)
UIBC: 223 ug/dL

## 2020-10-10 LAB — POCT HEMOGLOBIN-HEMACUE: Hemoglobin: 12.1 g/dL — ABNORMAL LOW (ref 13.0–17.0)

## 2020-10-10 MED ORDER — EPOETIN ALFA-EPBX 10000 UNIT/ML IJ SOLN
10000.0000 [IU] | INTRAMUSCULAR | Status: DC
Start: 2020-10-10 — End: 2020-10-11

## 2020-10-10 MED ORDER — EPOETIN ALFA-EPBX 10000 UNIT/ML IJ SOLN
INTRAMUSCULAR | Status: AC
  Filled 2020-10-10: qty 1

## 2020-10-19 ENCOUNTER — Other Ambulatory Visit: Payer: Self-pay | Admitting: Family Medicine

## 2020-10-19 DIAGNOSIS — E1122 Type 2 diabetes mellitus with diabetic chronic kidney disease: Secondary | ICD-10-CM

## 2020-10-20 ENCOUNTER — Other Ambulatory Visit: Payer: Self-pay

## 2020-10-20 ENCOUNTER — Telehealth: Payer: Self-pay

## 2020-10-20 DIAGNOSIS — E1122 Type 2 diabetes mellitus with diabetic chronic kidney disease: Secondary | ICD-10-CM

## 2020-10-20 MED ORDER — GLIPIZIDE ER 5 MG PO TB24: ORAL_TABLET | ORAL | 1 refills | Status: DC

## 2020-10-20 NOTE — Telephone Encounter (Signed)
MEDICATION: glipiZIDE (GLUCOTROL XL) 5 MG 24 hr tablet  PHARMACY: [facility] [address]  Comments:   **Let patient know to contact pharmacy at the end of the day to make sure medication is ready. **  ** Please notify patient to allow 48-72 hours to process**  **Encourage patient to contact the pharmacy for refills or they can request refills through [facility]**

## 2020-10-23 NOTE — Telephone Encounter (Signed)
Opened in error

## 2020-10-24 ENCOUNTER — Encounter (HOSPITAL_COMMUNITY)
Admission: RE | Admit: 2020-10-24 | Discharge: 2020-10-24 | Disposition: A | Discharge status: Home / Self Care | Payer: Medicare Other | Source: Ambulatory Visit | Attending: Nephrology | Admitting: Nephrology

## 2020-10-24 ENCOUNTER — Other Ambulatory Visit: Payer: Self-pay

## 2020-10-24 VITALS — BP 142/72 | HR 80 | Temp 97.9°F | Resp 20

## 2020-10-24 DIAGNOSIS — N179 Acute kidney failure, unspecified: Secondary | ICD-10-CM

## 2020-10-24 DIAGNOSIS — N184 Chronic kidney disease, stage 4 (severe): Secondary | ICD-10-CM | POA: Diagnosis not present

## 2020-10-24 LAB — POCT HEMOGLOBIN-HEMACUE: Hemoglobin: 11 g/dL — ABNORMAL LOW (ref 13.0–17.0)

## 2020-10-24 MED ORDER — EPOETIN ALFA-EPBX 10000 UNIT/ML IJ SOLN
INTRAMUSCULAR | Status: AC
  Filled 2020-10-24: qty 1

## 2020-10-24 MED ORDER — EPOETIN ALFA-EPBX 10000 UNIT/ML IJ SOLN
10000.0000 [IU] | INTRAMUSCULAR | Status: DC
Start: 2020-10-24 — End: 2020-10-25
  Administered 2020-10-24: 10000 [IU] via SUBCUTANEOUS

## 2020-10-25 ENCOUNTER — Other Ambulatory Visit (HOSPITAL_COMMUNITY): Payer: Self-pay | Admitting: Adult Health

## 2020-11-06 ENCOUNTER — Other Ambulatory Visit (HOSPITAL_COMMUNITY): Payer: Self-pay | Admitting: *Deleted

## 2020-11-07 ENCOUNTER — Other Ambulatory Visit: Payer: Self-pay

## 2020-11-07 ENCOUNTER — Encounter (HOSPITAL_COMMUNITY)
Admission: RE | Admit: 2020-11-07 | Discharge: 2020-11-07 | Disposition: A | Discharge status: Home / Self Care | Payer: Medicare Other | Source: Ambulatory Visit | Attending: Nephrology | Admitting: Nephrology

## 2020-11-07 ENCOUNTER — Encounter (HOSPITAL_COMMUNITY): Payer: Medicare Other

## 2020-11-07 VITALS — BP 119/63 | HR 65 | Temp 97.5°F | Resp 20

## 2020-11-07 DIAGNOSIS — N184 Chronic kidney disease, stage 4 (severe): Secondary | ICD-10-CM | POA: Diagnosis not present

## 2020-11-07 DIAGNOSIS — N179 Acute kidney failure, unspecified: Secondary | ICD-10-CM

## 2020-11-07 LAB — POCT HEMOGLOBIN-HEMACUE: Hemoglobin: 10.7 g/dL — ABNORMAL LOW (ref 13.0–17.0)

## 2020-11-07 MED ORDER — EPOETIN ALFA-EPBX 10000 UNIT/ML IJ SOLN
INTRAMUSCULAR | Status: AC
  Administered 2020-11-07: 10000 [IU] via SUBCUTANEOUS
  Filled 2020-11-07: qty 1

## 2020-11-07 MED ORDER — EPOETIN ALFA-EPBX 10000 UNIT/ML IJ SOLN: 10000.0000 [IU] | INTRAMUSCULAR | Status: DC

## 2020-11-07 MED ORDER — SODIUM CHLORIDE 0.9 % IV SOLN
510.0000 mg | INTRAVENOUS | Status: DC
  Administered 2020-11-07: 510 mg via INTRAVENOUS
  Filled 2020-11-07: qty 510

## 2020-11-10 ENCOUNTER — Other Ambulatory Visit: Payer: Self-pay | Admitting: Family Medicine

## 2020-11-10 DIAGNOSIS — F32A Depression, unspecified: Secondary | ICD-10-CM

## 2020-12-05 ENCOUNTER — Ambulatory Visit (HOSPITAL_COMMUNITY)
Admission: RE | Admit: 2020-12-05 | Discharge: 2020-12-05 | Disposition: A | Discharge status: Home / Self Care | Payer: Medicare Other | Source: Ambulatory Visit | Attending: Nephrology | Admitting: Nephrology

## 2020-12-05 ENCOUNTER — Other Ambulatory Visit: Payer: Self-pay

## 2020-12-05 ENCOUNTER — Encounter (HOSPITAL_COMMUNITY): Payer: Medicare Other

## 2020-12-05 VITALS — BP 107/59 | HR 74 | Temp 97.6°F | Resp 20

## 2020-12-05 DIAGNOSIS — N179 Acute kidney failure, unspecified: Secondary | ICD-10-CM | POA: Diagnosis present

## 2020-12-05 LAB — POCT HEMOGLOBIN-HEMACUE: Hemoglobin: 9.1 g/dL — ABNORMAL LOW (ref 13.0–17.0)

## 2020-12-05 MED ORDER — EPOETIN ALFA-EPBX 10000 UNIT/ML IJ SOLN: 10000.0000 [IU] | INTRAMUSCULAR | Status: DC

## 2020-12-05 MED ORDER — FERUMOXYTOL INJECTION 510 MG/17 ML
510.0000 mg | INTRAVENOUS | Status: DC
  Administered 2020-12-05: 510 mg via INTRAVENOUS
  Filled 2020-12-05: qty 510

## 2020-12-05 MED ORDER — EPOETIN ALFA-EPBX 10000 UNIT/ML IJ SOLN
INTRAMUSCULAR | Status: AC
  Administered 2020-12-05: 10000 [IU] via SUBCUTANEOUS
  Filled 2020-12-05: qty 1

## 2020-12-19 ENCOUNTER — Ambulatory Visit: Payer: Self-pay | Admitting: Family Medicine

## 2020-12-22 ENCOUNTER — Encounter: Payer: Self-pay | Admitting: Family Medicine

## 2020-12-22 ENCOUNTER — Ambulatory Visit (INDEPENDENT_AMBULATORY_CARE_PROVIDER_SITE_OTHER): Payer: Medicare Other | Admitting: Family Medicine

## 2020-12-22 ENCOUNTER — Other Ambulatory Visit: Payer: Self-pay

## 2020-12-22 VITALS — BP 118/70 | HR 86 | Temp 98.0°F | Resp 15 | Ht 66.0 in | Wt 136.6 lb

## 2020-12-22 DIAGNOSIS — R5383 Other fatigue: Secondary | ICD-10-CM

## 2020-12-22 DIAGNOSIS — E1165 Type 2 diabetes mellitus with hyperglycemia: Secondary | ICD-10-CM | POA: Diagnosis not present

## 2020-12-22 DIAGNOSIS — E162 Hypoglycemia, unspecified: Secondary | ICD-10-CM | POA: Diagnosis not present

## 2020-12-22 DIAGNOSIS — R7989 Other specified abnormal findings of blood chemistry: Secondary | ICD-10-CM

## 2020-12-22 DIAGNOSIS — F32A Depression, unspecified: Secondary | ICD-10-CM

## 2020-12-22 DIAGNOSIS — E785 Hyperlipidemia, unspecified: Secondary | ICD-10-CM | POA: Diagnosis not present

## 2020-12-22 DIAGNOSIS — E1122 Type 2 diabetes mellitus with diabetic chronic kidney disease: Secondary | ICD-10-CM | POA: Diagnosis not present

## 2020-12-22 LAB — POCT GLYCOSYLATED HEMOGLOBIN (HGB A1C): Hemoglobin A1C: 6 % — AB (ref 4.0–5.6)

## 2020-12-22 LAB — GLUCOSE, POCT (MANUAL RESULT ENTRY): POC Glucose: 110 mg/dl — AB (ref 70–99)

## 2020-12-22 MED ORDER — ATORVASTATIN CALCIUM 80 MG PO TABS: ORAL_TABLET | ORAL | 1 refills | Status: AC

## 2020-12-22 MED ORDER — SITAGLIPTIN PHOSPHATE 25 MG PO TABS
ORAL_TABLET | ORAL | 1 refills | Status: DC
Start: 2020-12-22 — End: 2021-08-31

## 2020-12-22 MED ORDER — CITALOPRAM HYDROBROMIDE 20 MG PO TABS: ORAL_TABLET | ORAL | 1 refills | Status: DC

## 2020-12-22 NOTE — Patient Instructions (Addendum)
Restart citalopram daily for now. Do not stop that medication abruptly in future.  I will check electrolytes and blood counts today.  If any worsening fatigue or new symptoms be seen in the emergency room.  Blood sugar 71-monthaverage was low at 6.0.  Blood sugar in the office looked okay.  I am concerned about you dropping too low.  Return to glipizide once per day, and make sure not to skip lunch.  Recheck with home readings fasting or 2 hours after meals in the next 2 weeks.  Return to the clinic or go to the nearest emergency room if any of your symptoms worsen or new symptoms occur.   Hypoglycemia Hypoglycemia occurs when the level of sugar (glucose) in the blood is too low. Hypoglycemia can happen in people who have or do not have diabetes. It can develop quickly, and it can be a medical emergency. For most people, a blood glucose level below 70 mg/dL (3.9 mmol/L) is consideredhypoglycemia. Glucose is a type of sugar that provides the body's main source of energy. Certain hormones (insulin and glucagon) control the level of glucose in the blood. Insulin lowers blood glucose, and glucagon raises blood glucose. Hypoglycemia can result from having too much insulin in the bloodstream, or from not eating enough food that contains glucose. You may also have reactive hypoglycemia, which happens within 4 hoursafter eating a meal. What are the causes? Hypoglycemia occurs most often in people who have diabetes and may be caused by: Diabetes medicine. Not eating enough, or not eating often enough. Increased physical activity. Drinking alcohol on an empty stomach. If you do not have diabetes, hypoglycemia may be caused by: A tumor in the pancreas. Not eating enough, or not eating for long periods at a time (fasting). A severe infection or illness. Problems after having bariatric surgery. Organ failure, such as kidney or liver failure. Certain medicines. What increases the risk? Hypoglycemia is more  likely to develop in people who: Have diabetes and take medicines to lower blood glucose. Abuse alcohol. Have a severe illness. What are the signs or symptoms? Symptoms vary depending on whether the condition is mild, moderate, or severe. Mild hypoglycemia Hunger. Sweating and feeling clammy. Dizziness or feeling light-headed. Sleepiness or restless sleep. Nausea. Increased heart rate. Headache. Blurry vision. Mood changes, such as irritability or anxiety. Tingling or numbness around the mouth, lips, or tongue. Moderate hypoglycemia Confusion and poor judgment. Behavior changes. Weakness. Irregular heartbeat. A change in coordination. Severe hypoglycemia Severe hypoglycemia is a medical emergency. It can cause: Fainting. Seizures. Loss of consciousness (coma). Death. How is this diagnosed? Hypoglycemia is diagnosed with a blood test to measure your blood glucose level. This blood test is done while you are having symptoms. Your health careprovider may also do a physical exam and review your medical history. How is this treated? This condition can be treated by immediately eating or drinking something that contains sugar with 15 grams of fast-acting carbohydrate, such as: 4 oz (120 mL) of fruit juice. 4 oz (120 mL) of regular soda (not diet soda). Several pieces of hard candy. Check food labels to find out how many pieces to eat for 15 grams. 1 Tbsp (15 mL) of sugar or honey. 4 glucose tablets. 1 tube of glucose gel. Treating hypoglycemia if you have diabetes If you are alert and able to swallow safely, follow the 15:15 rule: Take 15 grams of a fast-acting carbohydrate. Talk with your health care provider about how much you should take. Options  for getting 15 grams of fast-acting carbohydrate include: Glucose tablets (take 4 tablets). Several pieces of hard candy. Check food labels to find out how many pieces to eat for 15 grams. 4 oz (120 mL) of fruit juice. 4 oz (120 mL)  of regular soda (not diet soda). 1 Tbsp (15 mL) of sugar or honey. 1 tube of glucose gel. Check your blood glucose 15 minutes after you take the carbohydrate. If the repeat blood glucose level is still at or below 70 mg/dL (3.9 mmol/L), take 15 grams of a carbohydrate again. If your blood glucose level does not increase above 70 mg/dL (3.9 mmol/L) after 3 tries, seek emergency medical care. After your blood glucose level returns to normal, eat a meal or a snack within 1 hour.  Treating severe hypoglycemia Severe hypoglycemia is when your blood glucose level is below 54 mg/dL (3 mmol/L). Severe hypoglycemia is a medical emergency. Get medical help right away. If you have severe hypoglycemia and you cannot eat or drink, you will need to be given glucagon. A family member or close friend should learn how to check your blood glucose and how to give you glucagon. Ask your health care providerif you need to have an emergency glucagon kit available. Severe hypoglycemia may need to be treated in a hospital. The treatment may include getting glucose through an IV. You may also need treatment for thecause of your hypoglycemia. Follow these instructions at home:  General instructions Take over-the-counter and prescription medicines only as told by your health care provider. Monitor your blood glucose as told by your health care provider. If you drink alcohol: Limit how much you have to: 0-1 drink a day for women who are not pregnant. 0-2 drinks a day for men. Know how much alcohol is in your drink. In the [place] one drink equals one 12 oz bottle of beer (355 mL), one 5 oz glass of wine (148 mL), or one 1 oz glass of hard liquor (44 mL). Be sure to eat food along with drinking alcohol. Be aware that alcohol is absorbed quickly and may have lingering effects that may result in hypoglycemia later. Be sure to do ongoing glucose monitoring. Keep all follow-up visits. This is important. If you have  diabetes: Always have a fast-acting carbohydrate (15 grams) option with you to treat low blood glucose. Follow your diabetes management plan as directed by your health care provider. Make sure you: Know the symptoms of hypoglycemia. It is important to treat it right away to prevent it from becoming severe. Check your blood glucose as often as told. Always check before and after exercise. Always check your blood glucose before you drive a motorized vehicle. Take your medicines as told. Follow your meal plan. Eat on time, and do not skip meals. Share your diabetes management plan with people in your workplace, school, and household. Carry a medical alert card or wear medical alert jewelry. Where to find more information American Diabetes Association: www.diabetes.org Contact a health care provider if: You have problems keeping your blood glucose in your target range. You have frequent episodes of hypoglycemia. Get help right away if: You continue to have hypoglycemia symptoms after eating or drinking something that contains 15 grams of fast-acting carbohydrate, and you cannot get your blood glucose above 70 mg/dL (3.9 mmol/L) while following the 15:15 rule. Your blood glucose is below 54 mg/dL (3 mmol/L). You have a seizure. You faint. These symptoms may represent a serious problem that is an emergency. Do  not wait to see if the symptoms will go away. Get medical help right away. Call your local emergency services (911 in the [place] Do not drive yourself to the hospital. Summary Hypoglycemia occurs when the level of sugar (glucose) in the blood is too low. Hypoglycemia can happen in people who have or do not have diabetes. It can develop quickly, and it can be a medical emergency. Make sure you know the symptoms of hypoglycemia and how to treat it. Always have a fast-acting carbohydrate option with you to treat low blood sugar. This information is not intended to replace advice given to you  by your health care provider. Make sure you discuss any questions you have with your healthcare provider. Document Revised: 05/29/2020 Document Reviewed: 05/29/2020 Elsevier Patient Education  2022 [place].   Fatigue If you have fatigue, you feel tired all the time and have a lack of energy or a lack of motivation. Fatigue may make it difficult to start or complete tasks because of exhaustion. In general, occasional or mild fatigue is often a normal response to activity or life. However, long-lasting (chronic) or extreme fatigue may be a symptom of a medical condition. Follow these instructions at home: General instructions Watch your fatigue for any changes. Go to bed and get up at the same time every day. Avoid fatigue by pacing yourself during the day and getting enough sleep at night. Maintain a healthy weight. Medicines Take over-the-counter and prescription medicines only as told by your health care provider. Take a multivitamin, if told by your health care provider.  Do not use herbal or dietary supplements unless they are approved by your health care provider. Activity  Exercise regularly, as told by your health care provider. Use or practice techniques to help you relax, such as yoga, tai chi, meditation, or massage therapy.  Eating and drinking  Avoid heavy meals in the evening. Eat a well-balanced diet, which includes lean proteins, whole grains, plenty of fruits and vegetables, and low-fat dairy products. Avoid consuming too much caffeine. Avoid the use of alcohol. Drink enough fluid to keep your urine pale yellow.  Lifestyle Change situations that cause you stress. Try to keep your work and personal schedule in balance. Do not use any products that contain nicotine or tobacco, such as cigarettes and e-cigarettes. If you need help quitting, ask your health care provider. Do not use drugs. Contact a health care provider if: Your fatigue does not get better. You  have a fever. You suddenly lose or gain weight. You have headaches. You have trouble falling asleep or sleeping through the night. You feel angry, guilty, anxious, or sad. You are unable to have a bowel movement (constipation). Your skin is dry. You have swelling in your legs or another part of your body. Get help right away if: You feel confused. Your vision is blurry. You feel faint or you pass out. You have a severe headache. You have severe pain in your abdomen, your back, or the area between your waist and hips (pelvis). You have chest pain, shortness of breath, or an irregular or fast heartbeat. You are unable to urinate, or you urinate less than normal. You have abnormal bleeding, such as bleeding from the rectum, vagina, nose, lungs, or nipples. You vomit blood. You have thoughts about hurting yourself or others. If you ever feel like you may hurt yourself or others, or have thoughts about taking your own life, get help right away. You can go to your nearest  emergency department or call: Your local emergency services (911 in the [place] A suicide crisis helpline, such as the [place] at 431-549-3143. This is open 24 hours a day. Summary If you have fatigue, you feel tired all the time and have a lack of energy or a lack of motivation. Fatigue may make it difficult to start or complete tasks because of exhaustion. Long-lasting (chronic) or extreme fatigue may be a symptom of a medical condition. Exercise regularly, as told by your health care provider. Change situations that cause you stress. Try to keep your work and personal schedule in balance. This information is not intended to replace advice given to you by your health care provider. Make sure you discuss any questions you have with your healthcare provider. Document Revised: 05/08/2020 Document Reviewed: 05/08/2020 Elsevier Patient Education  2022 [employer].

## 2020-12-22 NOTE — Progress Notes (Signed)
Subjective:  Patient ID: Adam Reid, male    DOB: [birth year]  Age: 75 y.o. MRN: [id number]  CC:  Chief Complaint  Patient presents with   Diabetes    Pt here for follow up on dm, pt reports 10 days feeling off balance, denies light headedness, BG at home has been avg 160    tyroid    Pt here for recheck 6 month visit as was boarderline last OV    Hyperlipidemia    Pt in need of refill today as well as recheck     HPI Adam Reid presents for  Wife translating.   Diabetes: With CKD stage IV.  Followed by nephrology.  Dr. [name]. Met with nephrology few weeks ago. No plan for transplant - due to heart issues? Still watching renal function. Plan for dialysis if persistently low GFR below 200.  Home readings in am - 160-165. Occasional 200. usually high 100's. Blood sugar 360 today after breakfast. No HA or current nausea.  Did have low of 49 last week, felt low another time at 21 in past 2 weeks. Both times at 3 in afternoon. Skips lunch usually.  Taking all meds in morning,  Current treatment of Januvia 25 mg daily, Farxiga 5 mg daily, glipizide 5 mg daily(he has taken 2 per day in last week). Not on metformin due to renal disease. No insulin.  Feeling off balance past 10 days. Whole body feels like no energy. No focal weakness. No facial droop, no slurred speech.  Urinating normally, nausea in am, no vomiting.  Hx of anemia of chronic disease.  No CP/palpitations.   Microalbumin: Optho, foot exam, pneumovax:  Lab Results  Component Value Date   HGBA1C 6.0 (A) 12/22/2020   HGBA1C 6.7 (A) 06/19/2020   HGBA1C 7.3 (H) 03/07/2020   Lab Results  Component Value Date   MICROALBUR 60.3 09/02/2015   LDLCALC 73 06/19/2020   CREATININE 3.31 (H) 08/15/2020   Lab Results  Component Value Date   WBC 10.5 03/07/2020   HGB 9.1 (L) 12/05/2020   HCT 37.8 (L) 03/07/2020   MCV 83.8 03/07/2020   PLT 349 03/07/2020     Elevated TSH Lab Results  Component Value Date   TSH  4.720 (H) 06/19/2020  No current meds.   Hyperlipidemia: Liptor 80mg  qd.  Lab Results  Component Value Date   CHOL 191 06/19/2020   HDL 107 06/19/2020   LDLCALC 73 06/19/2020   TRIG 61 06/19/2020   CHOLHDL 1.8 06/19/2020   Lab Results  Component Value Date   ALT 11 06/19/2020   AST 11 06/19/2020   ALKPHOS 121 06/19/2020   BILITOT 0.2 06/19/2020    Depression: Discussed higher dose of Celexa in 03/2020 (with close monitoring of QT interval) but he refused. Kept on same dose of 20mg  celexa daily. Ran out 2-3 weeks ago. Feels about the same on med, no SI.  Off balance as above past 10 days.   Depression screen [facility] 2/9 12/22/2020 06/19/2020 04/04/2020 03/07/2020 01/03/2020  Decreased Interest 0 0 0 2 0  Down, Depressed, Hopeless 0 0 0 1 0  PHQ - 2 Score 0 0 0 3 0  Altered sleeping 1 - 1 0 -  Tired, decreased energy 0 - 1 0 -  Change in appetite 0 - 0 0 -  Feeling bad or failure about yourself  0 - 0 2 -  Trouble concentrating 0 - 0 0 -  Moving slowly or fidgety/restless 0 - 0 2 -  Suicidal thoughts 0 - 0 0 -  PHQ-9 Score 1 - 2 7 -  Difficult doing work/chores - - - Somewhat difficult -  Some recent data might be hidden     History Patient Active Problem List   Diagnosis Date Noted   Acute CHF (congestive heart failure) ([place]) 09/24/2019   ARF (acute renal failure) ([place]) 09/24/2019   Anemia 09/24/2019   Acute respiratory failure with hypoxia ([place])    Secondary hyperparathyroidism, 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   Smoker 03/26/2016   Hypotension 01/29/2016   Abnormal myocardial perfusion study 01/29/2016   Depression 12/09/2015   Hyperlipidemia LDL goal <100 06/28/2014   Diabetes mellitus with renal manifestations, controlled ([place])    HTN (hypertension) 06/24/2014   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[place]) 06/24/2014   BPH (benign prostatic hyperplasia) 06/24/2014   Past Medical History:  Diagnosis Date   Anemia    CHF (congestive heart  failure) ([place])    Chronic kidney disease    Depression    Diabetes mellitus without complication ([place])    Hyperlipidemia    Hypertension    Thyroid disease    was on supplement, taken off by [name]   Past Surgical History:  Procedure Laterality Date   APPENDECTOMY     CARDIAC CATHETERIZATION N/A 01/30/2016   Procedure: Left Heart Cath and Coronary Angiography;  Surgeon: [name], MD;  Location: [facility];  Service: Cardiovascular;  Laterality: N/A;   No Known Allergies Prior to Admission medications   Medication Sig Start Date End Date Taking? Authorizing Provider  amiodarone (PACERONE) 200 MG tablet Take 1 tablet (200 mg total) by mouth daily. Please call for office viist [phone] 10/27/20   Bensimhon, Shaune Pascal, MD  apixaban (ELIQUIS) 2.5 MG TABS tablet Take 1 tablet (2.5 mg total) by mouth 2 (two) times daily. 03/07/20   Bensimhon, Shaune Pascal, MD  atorvastatin (LIPITOR) 80 MG tablet TAKE 1 TABLET(80 MG) BY MOUTH DAILY 06/19/20   [name], MD  blood glucose meter kit and supplies Use up to two times daily as directed  ICD10 E10.9 E11.9 02/20/19   [name], MD  Blood Glucose Monitoring Suppl (ONETOUCH VERIO) w/Device KIT as directed.  09/03/15   [provider]  cyanocobalamin 500 MCG tablet Take 500 mcg by mouth daily. Reported on 12/02/2015    [provider]  dapagliflozin propanediol (FARXIGA) 5 MG TABS tablet Take 1 tablet (5 mg total) by mouth daily. 09/09/20   Bensimhon, Shaune Pascal, MD  diltiazem (CARDIZEM CD) 180 MG 24 hr capsule TAKE 1 CAPSULE(180 MG) BY MOUTH DAILY 03/20/20   Bensimhon, Shaune Pascal, MD  glipiZIDE (GLUCOTROL XL) 5 MG 24 hr tablet TAKE 1 TABLET(5 MG) BY MOUTH DAILY WITH BREAKFAST 10/20/20   [name], MD  GlucoCom Lancets MISC Use for home glucose monitoring 09/02/15   Harrison Mons, PA  glucose blood (ONETOUCH VERIO) test strip Test up to 2 times per day.  Uncontrolled diabetes with hyperglycemia and stage 4  CKD. 04/13/19   [name], MD  Omega 3-6-9 Fatty Acids (OMEGA 3-6-9 COMPLEX PO) Take 1 tablet by mouth daily.    [provider]  pantoprazole (PROTONIX) 40 MG tablet Take 1 tablet (40 mg total) by mouth daily. 12/25/19   Levin Erp, PA  polyethylene glycol (MIRALAX / GLYCOLAX) 17 g packet Take 17 g by mouth daily as needed for mild constipation or moderate constipation.  [provider]  senna (SENOKOT) 8.6 MG tablet Take 1 tablet by mouth as needed for constipation.     [provider]  sitaGLIPtin (JANUVIA) 25 MG tablet TAKE 1 TABLET(25 MG) BY MOUTH DAILY 06/19/20   Shade Flood, MD  tamsulosin (FLOMAX) 0.4 MG CAPS capsule TAKE 2 CAPSULES(0.8 MG) BY MOUTH DAILY 01/08/20   Shade Flood, MD  torsemide (DEMADEX) 20 MG tablet Take 1 tablet (20 mg total) by mouth as needed. 03/07/20   Laurey Morale, MD  citalopram (CELEXA) 20 MG tablet TAKE 1 TABLET(20 MG) BY MOUTH DAILY 01/08/20 03/07/20  Shade Flood, MD   Social History   Socioeconomic History   Marital status: Married    Spouse name: [name]   Number of children: 1   Years of education: 12th grade   Highest education level: Not on file  Occupational History   Occupation: Retired-accountant  Tobacco Use   Smoking status: Light Smoker    Packs/day: 0.10    Years: 48.00    Pack years: 4.80    Types: Cigarettes    Last attempt to quit: 07/16/2019    Years since quitting: 1.4   Smokeless tobacco: Never   Tobacco comments:    per pt he has one cigarette every couple days  Vaping Use   Vaping Use: Never used  Substance and Sexual Activity   Alcohol use: No    Alcohol/week: 0.0 standard drinks   Drug use: No   Sexual activity: Never    Partners: Female  Other Topics Concern   Not on file  Social History Narrative   Originally from [place]. Came to the [place] in 2009, following their son who came here for school.   Married.   Lives with his wife.   Their adult son lives in  [place], [place], where he is in optometry school.   Education: [employer]   Exercise: No   Social Determinants of Corporate investment banker Strain: Not on file  Food Insecurity: Not on file  Transportation Needs: Not on file  Physical Activity: Not on file  Stress: Not on file  Social Connections: Not on file  Intimate Partner Violence: Not on file    Review of Systems Per HPI.   Objective:   Vitals:   12/22/20 1349  BP: 118/70  Pulse: 86  Resp: 15  Temp: 98 F (36.7 C)  TempSrc: Temporal  SpO2: 98%  Weight: 136 lb 9.6 oz (62 kg)  Height: 5\' 6"  (1.676 m)    Physical Exam Vitals reviewed.  Constitutional:      Appearance: He is well-developed.  HENT:     Head: Normocephalic and atraumatic.  Neck:     Vascular: No carotid bruit or JVD.  Cardiovascular:     Rate and Rhythm: Normal rate and regular rhythm.     Heart sounds: Normal heart sounds. No murmur heard. Pulmonary:     Effort: Pulmonary effort is normal.     Breath sounds: Normal breath sounds. No rales.  Abdominal:     General: Abdomen is flat.     Palpations: Abdomen is soft.     Tenderness: There is no abdominal tenderness (gas feeling, nontender.).  Musculoskeletal:     Right lower leg: No edema.     Left lower leg: No edema.  Skin:    General: Skin is warm and dry.  Neurological:     Mental Status: He is alert and oriented to person, place, and time.  GCS: GCS eye subscore is 4. GCS verbal subscore is 5. GCS motor subscore is 6.     Cranial Nerves: No cranial nerve deficit, dysarthria or facial asymmetry.     Sensory: No sensory deficit.     Motor: No weakness, tremor or pronator drift.     Comments: No focal weakness. No pronator drift.   Psychiatric:        Mood and Affect: Mood normal.  Appears euvolemic. No rash/uremic frost.   Results for orders placed or performed in visit on 12/22/20  POCT glucose (manual entry)  Result Value Ref Range   POC Glucose 110 (A) 70 - 99 mg/dl  POCT  glycosylated hemoglobin (Hb A1C)  Result Value Ref Range   Hemoglobin A1C 6.0 (A) 4.0 - 5.6 %   HbA1c POC (<> result, manual entry)     HbA1c, POC (prediabetic range)     HbA1c, POC (controlled diabetic range)     45 minutes spent during visit, including chart review, counseling and assimilation of information, exam, discussion of plan, and chart completion.    Assessment & Plan:  Adam Reid is a 75 y.o. male . Fatigue, unspecified type - Plan: Comprehensive metabolic panel, TSH, CBC Nonfocal neurologic exam.  Possible combination of episodes of hypoglycemia, along with serotonin withdrawal from coming off Celexa.  However with his chronic kidney disease and history of anemia we will check CBC, CMP tonight.  Check TSH with prior slight elevation. ER precautions if any new or worsening symptoms.  Hyperlipidemia, unspecified hyperlipidemia type - Plan: atorvastatin (LIPITOR) 80 MG tablet, Comprehensive metabolic panel, Lipid panel  -  Stable, tolerating current regimen. Medications refilled. Labs pending as above.   Type 2 diabetes mellitus with chronic kidney disease, without long-term current use of insulin, unspecified CKD stage ([place]) - Plan: sitaGLIPtin (JANUVIA) 25 MG tablet, POCT glucose (manual entry), POCT glycosylated hemoglobin (Hb A1C) Hypoglycemia - Plan: POCT glucose (manual entry), POCT glycosylated hemoglobin (Hb A1C) Type 2 diabetes mellitus with hyperglycemia, without long-term current use of insulin ([place]) - Plan: POCT glucose (manual entry), POCT glycosylated hemoglobin (Hb A1C)  -Report hyperglycemia as well as hypoglycemia as above.  Based on A1c likely is running lower during most of the time.  Risks of hypoglycemia were again discussed.  Stressed importance of not skipping meals.  Decrease glipizide to 1/day, continue Januvia and Farxiga same doses for now.  Recheck in the next 2 weeks with home readings, sooner if persistent hypoglycemia.  Elevated TSH - Plan:  TSH  Depression, unspecified depression type - Plan: citalopram (CELEXA) 20 MG tablet  -Fair control symptoms, but now possible serotonin withdrawal as above.  Restart Celexa 20 mg dose and  can discuss symptoms further at follow-up visit.  Meds ordered this encounter  Medications   atorvastatin (LIPITOR) 80 MG tablet    Sig: TAKE 1 TABLET(80 MG) BY MOUTH DAILY    Dispense:  90 tablet    Refill:  1   sitaGLIPtin (JANUVIA) 25 MG tablet    Sig: TAKE 1 TABLET(25 MG) BY MOUTH DAILY    Dispense:  90 tablet    Refill:  1   citalopram (CELEXA) 20 MG tablet    Sig: TAKE 1 TABLET(20 MG) BY MOUTH DAILY    Dispense:  90 tablet    Refill:  1   Patient Instructions  Restart citalopram daily for now. Do not stop that medication abruptly in future.  I will check electrolytes and blood counts today.  If any worsening  fatigue or new symptoms be seen in the emergency room.  Blood sugar 31-month average was low at 6.0.  Blood sugar in the office looked okay.  I am concerned about you dropping too low.  Return to glipizide once per day, and make sure not to skip lunch.  Recheck with home readings fasting or 2 hours after meals in the next 2 weeks.  Return to the clinic or go to the nearest emergency room if any of your symptoms worsen or new symptoms occur.   Hypoglycemia Hypoglycemia occurs when the level of sugar (glucose) in the blood is too low. Hypoglycemia can happen in people who have or do not have diabetes. It can develop quickly, and it can be a medical emergency. For most people, a blood glucose level below 70 mg/dL (3.9 mmol/L) is consideredhypoglycemia. Glucose is a type of sugar that provides the body's main source of energy. Certain hormones (insulin and glucagon) control the level of glucose in the blood. Insulin lowers blood glucose, and glucagon raises blood glucose. Hypoglycemia can result from having too much insulin in the bloodstream, or from not eating enough food that contains  glucose. You may also have reactive hypoglycemia, which happens within 4 hoursafter eating a meal. What are the causes? Hypoglycemia occurs most often in people who have diabetes and may be caused by: Diabetes medicine. Not eating enough, or not eating often enough. Increased physical activity. Drinking alcohol on an empty stomach. If you do not have diabetes, hypoglycemia may be caused by: A tumor in the pancreas. Not eating enough, or not eating for long periods at a time (fasting). A severe infection or illness. Problems after having bariatric surgery. Organ failure, such as kidney or liver failure. Certain medicines. What increases the risk? Hypoglycemia is more likely to develop in people who: Have diabetes and take medicines to lower blood glucose. Abuse alcohol. Have a severe illness. What are the signs or symptoms? Symptoms vary depending on whether the condition is mild, moderate, or severe. Mild hypoglycemia Hunger. Sweating and feeling clammy. Dizziness or feeling light-headed. Sleepiness or restless sleep. Nausea. Increased heart rate. Headache. Blurry vision. Mood changes, such as irritability or anxiety. Tingling or numbness around the mouth, lips, or tongue. Moderate hypoglycemia Confusion and poor judgment. Behavior changes. Weakness. Irregular heartbeat. A change in coordination. Severe hypoglycemia Severe hypoglycemia is a medical emergency. It can cause: Fainting. Seizures. Loss of consciousness (coma). Death. How is this diagnosed? Hypoglycemia is diagnosed with a blood test to measure your blood glucose level. This blood test is done while you are having symptoms. Your health careprovider may also do a physical exam and review your medical history. How is this treated? This condition can be treated by immediately eating or drinking something that contains sugar with 15 grams of fast-acting carbohydrate, such as: 4 oz (120 mL) of fruit juice. 4 oz  (120 mL) of regular soda (not diet soda). Several pieces of hard candy. Check food labels to find out how many pieces to eat for 15 grams. 1 Tbsp (15 mL) of sugar or honey. 4 glucose tablets. 1 tube of glucose gel. Treating hypoglycemia if you have diabetes If you are alert and able to swallow safely, follow the 15:15 rule: Take 15 grams of a fast-acting carbohydrate. Talk with your health care provider about how much you should take. Options for getting 15 grams of fast-acting carbohydrate include: Glucose tablets (take 4 tablets). Several pieces of hard candy. Check food labels to find out how  many pieces to eat for 15 grams. 4 oz (120 mL) of fruit juice. 4 oz (120 mL) of regular soda (not diet soda). 1 Tbsp (15 mL) of sugar or honey. 1 tube of glucose gel. Check your blood glucose 15 minutes after you take the carbohydrate. If the repeat blood glucose level is still at or below 70 mg/dL (3.9 mmol/L), take 15 grams of a carbohydrate again. If your blood glucose level does not increase above 70 mg/dL (3.9 mmol/L) after 3 tries, seek emergency medical care. After your blood glucose level returns to normal, eat a meal or a snack within 1 hour.  Treating severe hypoglycemia Severe hypoglycemia is when your blood glucose level is below 54 mg/dL (3 mmol/L). Severe hypoglycemia is a medical emergency. Get medical help right away. If you have severe hypoglycemia and you cannot eat or drink, you will need to be given glucagon. A family member or close friend should learn how to check your blood glucose and how to give you glucagon. Ask your health care providerif you need to have an emergency glucagon kit available. Severe hypoglycemia may need to be treated in a hospital. The treatment may include getting glucose through an IV. You may also need treatment for thecause of your hypoglycemia. Follow these instructions at home:  General instructions Take over-the-counter and prescription medicines  only as told by your health care provider. Monitor your blood glucose as told by your health care provider. If you drink alcohol: Limit how much you have to: 0-1 drink a day for women who are not pregnant. 0-2 drinks a day for men. Know how much alcohol is in your drink. In the [place] one drink equals one 12 oz bottle of beer (355 mL), one 5 oz glass of wine (148 mL), or one 1 oz glass of hard liquor (44 mL). Be sure to eat food along with drinking alcohol. Be aware that alcohol is absorbed quickly and may have lingering effects that may result in hypoglycemia later. Be sure to do ongoing glucose monitoring. Keep all follow-up visits. This is important. If you have diabetes: Always have a fast-acting carbohydrate (15 grams) option with you to treat low blood glucose. Follow your diabetes management plan as directed by your health care provider. Make sure you: Know the symptoms of hypoglycemia. It is important to treat it right away to prevent it from becoming severe. Check your blood glucose as often as told. Always check before and after exercise. Always check your blood glucose before you drive a motorized vehicle. Take your medicines as told. Follow your meal plan. Eat on time, and do not skip meals. Share your diabetes management plan with people in your workplace, school, and household. Carry a medical alert card or wear medical alert jewelry. Where to find more information American Diabetes Association: www.diabetes.org Contact a health care provider if: You have problems keeping your blood glucose in your target range. You have frequent episodes of hypoglycemia. Get help right away if: You continue to have hypoglycemia symptoms after eating or drinking something that contains 15 grams of fast-acting carbohydrate, and you cannot get your blood glucose above 70 mg/dL (3.9 mmol/L) while following the 15:15 rule. Your blood glucose is below 54 mg/dL (3 mmol/L). You have a seizure. You  faint. These symptoms may represent a serious problem that is an emergency. Do not wait to see if the symptoms will go away. Get medical help right away. Call your local emergency services (911 in the [place] Do  not drive yourself to the hospital. Summary Hypoglycemia occurs when the level of sugar (glucose) in the blood is too low. Hypoglycemia can happen in people who have or do not have diabetes. It can develop quickly, and it can be a medical emergency. Make sure you know the symptoms of hypoglycemia and how to treat it. Always have a fast-acting carbohydrate option with you to treat low blood sugar. This information is not intended to replace advice given to you by your health care provider. Make sure you discuss any questions you have with your healthcare provider. Document Revised: 05/29/2020 Document Reviewed: 05/29/2020 Elsevier Patient Education  2022 [place].   Fatigue If you have fatigue, you feel tired all the time and have a lack of energy or a lack of motivation. Fatigue may make it difficult to start or complete tasks because of exhaustion. In general, occasional or mild fatigue is often a normal response to activity or life. However, long-lasting (chronic) or extreme fatigue may be a symptom of a medical condition. Follow these instructions at home: General instructions Watch your fatigue for any changes. Go to bed and get up at the same time every day. Avoid fatigue by pacing yourself during the day and getting enough sleep at night. Maintain a healthy weight. Medicines Take over-the-counter and prescription medicines only as told by your health care provider. Take a multivitamin, if told by your health care provider.  Do not use herbal or dietary supplements unless they are approved by your health care provider. Activity  Exercise regularly, as told by your health care provider. Use or practice techniques to help you relax, such as yoga, tai chi, meditation, or  massage therapy.  Eating and drinking  Avoid heavy meals in the evening. Eat a well-balanced diet, which includes lean proteins, whole grains, plenty of fruits and vegetables, and low-fat dairy products. Avoid consuming too much caffeine. Avoid the use of alcohol. Drink enough fluid to keep your urine pale yellow.  Lifestyle Change situations that cause you stress. Try to keep your work and personal schedule in balance. Do not use any products that contain nicotine or tobacco, such as cigarettes and e-cigarettes. If you need help quitting, ask your health care provider. Do not use drugs. Contact a health care provider if: Your fatigue does not get better. You have a fever. You suddenly lose or gain weight. You have headaches. You have trouble falling asleep or sleeping through the night. You feel angry, guilty, anxious, or sad. You are unable to have a bowel movement (constipation). Your skin is dry. You have swelling in your legs or another part of your body. Get help right away if: You feel confused. Your vision is blurry. You feel faint or you pass out. You have a severe headache. You have severe pain in your abdomen, your back, or the area between your waist and hips (pelvis). You have chest pain, shortness of breath, or an irregular or fast heartbeat. You are unable to urinate, or you urinate less than normal. You have abnormal bleeding, such as bleeding from the rectum, vagina, nose, lungs, or nipples. You vomit blood. You have thoughts about hurting yourself or others. If you ever feel like you may hurt yourself or others, or have thoughts about taking your own life, get help right away. You can go to your nearest emergency department or call: Your local emergency services (911 in the [place] A suicide crisis helpline, such as the [facility] at (939)597-6984. This  is open 24 hours a day. Summary If you have fatigue, you feel tired all the time  and have a lack of energy or a lack of motivation. Fatigue may make it difficult to start or complete tasks because of exhaustion. Long-lasting (chronic) or extreme fatigue may be a symptom of a medical condition. Exercise regularly, as told by your health care provider. Change situations that cause you stress. Try to keep your work and personal schedule in balance. This information is not intended to replace advice given to you by your health care provider. Make sure you discuss any questions you have with your healthcare provider. Document Revised: 05/08/2020 Document Reviewed: 05/08/2020 Elsevier Patient Education  2022 [place],   Merri Ray, MD [facility] 12/22/20 3:18 PM

## 2020-12-23 ENCOUNTER — Inpatient Hospital Stay (HOSPITAL_COMMUNITY)
Admission: EM | Admit: 2020-12-23 | Discharge: 2020-12-27 | DRG: 378 | Disposition: A | Discharge status: Home / Self Care | Payer: Medicare Other | Attending: Internal Medicine | Admitting: Internal Medicine

## 2020-12-23 ENCOUNTER — Encounter (HOSPITAL_COMMUNITY): Payer: Self-pay

## 2020-12-23 ENCOUNTER — Other Ambulatory Visit: Payer: Self-pay

## 2020-12-23 ENCOUNTER — Telehealth: Payer: Self-pay

## 2020-12-23 DIAGNOSIS — Z8249 Family history of ischemic heart disease and other diseases of the circulatory system: Secondary | ICD-10-CM | POA: Diagnosis not present

## 2020-12-23 DIAGNOSIS — I480 Paroxysmal atrial fibrillation: Secondary | ICD-10-CM | POA: Diagnosis present

## 2020-12-23 DIAGNOSIS — E785 Hyperlipidemia, unspecified: Secondary | ICD-10-CM | POA: Diagnosis present

## 2020-12-23 DIAGNOSIS — N184 Chronic kidney disease, stage 4 (severe): Secondary | ICD-10-CM | POA: Diagnosis present

## 2020-12-23 DIAGNOSIS — K2289 Other specified disease of esophagus: Secondary | ICD-10-CM | POA: Diagnosis not present

## 2020-12-23 DIAGNOSIS — K449 Diaphragmatic hernia without obstruction or gangrene: Secondary | ICD-10-CM | POA: Diagnosis present

## 2020-12-23 DIAGNOSIS — Z83438 Family history of other disorder of lipoprotein metabolism and other lipidemia: Secondary | ICD-10-CM | POA: Diagnosis not present

## 2020-12-23 DIAGNOSIS — Z7984 Long term (current) use of oral hypoglycemic drugs: Secondary | ICD-10-CM | POA: Diagnosis not present

## 2020-12-23 DIAGNOSIS — Z841 Family history of disorders of kidney and ureter: Secondary | ICD-10-CM | POA: Diagnosis not present

## 2020-12-23 DIAGNOSIS — D509 Iron deficiency anemia, unspecified: Secondary | ICD-10-CM | POA: Diagnosis present

## 2020-12-23 DIAGNOSIS — I5032 Chronic diastolic (congestive) heart failure: Secondary | ICD-10-CM | POA: Diagnosis present

## 2020-12-23 DIAGNOSIS — F32A Depression, unspecified: Secondary | ICD-10-CM | POA: Diagnosis present

## 2020-12-23 DIAGNOSIS — D649 Anemia, unspecified: Secondary | ICD-10-CM | POA: Diagnosis present

## 2020-12-23 DIAGNOSIS — K2270 Barrett's esophagus without dysplasia: Secondary | ICD-10-CM | POA: Diagnosis present

## 2020-12-23 DIAGNOSIS — E1129 Type 2 diabetes mellitus with other diabetic kidney complication: Secondary | ICD-10-CM | POA: Diagnosis present

## 2020-12-23 DIAGNOSIS — F17200 Nicotine dependence, unspecified, uncomplicated: Secondary | ICD-10-CM | POA: Diagnosis present

## 2020-12-23 DIAGNOSIS — E1122 Type 2 diabetes mellitus with diabetic chronic kidney disease: Secondary | ICD-10-CM | POA: Diagnosis present

## 2020-12-23 DIAGNOSIS — D638 Anemia in other chronic diseases classified elsewhere: Secondary | ICD-10-CM | POA: Diagnosis present

## 2020-12-23 DIAGNOSIS — N400 Enlarged prostate without lower urinary tract symptoms: Secondary | ICD-10-CM | POA: Diagnosis present

## 2020-12-23 DIAGNOSIS — R195 Other fecal abnormalities: Secondary | ICD-10-CM | POA: Diagnosis not present

## 2020-12-23 DIAGNOSIS — F17210 Nicotine dependence, cigarettes, uncomplicated: Secondary | ICD-10-CM | POA: Diagnosis present

## 2020-12-23 DIAGNOSIS — I130 Hypertensive heart and chronic kidney disease with heart failure and stage 1 through stage 4 chronic kidney disease, or unspecified chronic kidney disease: Secondary | ICD-10-CM | POA: Diagnosis present

## 2020-12-23 DIAGNOSIS — I10 Essential (primary) hypertension: Secondary | ICD-10-CM | POA: Diagnosis present

## 2020-12-23 DIAGNOSIS — K922 Gastrointestinal hemorrhage, unspecified: Secondary | ICD-10-CM | POA: Diagnosis present

## 2020-12-23 DIAGNOSIS — I1310 Hypertensive heart and chronic kidney disease without heart failure, with stage 1 through stage 4 chronic kidney disease, or unspecified chronic kidney disease: Secondary | ICD-10-CM | POA: Diagnosis present

## 2020-12-23 DIAGNOSIS — D631 Anemia in chronic kidney disease: Secondary | ICD-10-CM | POA: Diagnosis present

## 2020-12-23 DIAGNOSIS — E1169 Type 2 diabetes mellitus with other specified complication: Secondary | ICD-10-CM | POA: Diagnosis present

## 2020-12-23 DIAGNOSIS — Z79899 Other long term (current) drug therapy: Secondary | ICD-10-CM

## 2020-12-23 DIAGNOSIS — I2510 Atherosclerotic heart disease of native coronary artery without angina pectoris: Secondary | ICD-10-CM | POA: Diagnosis present

## 2020-12-23 DIAGNOSIS — Z20822 Contact with and (suspected) exposure to covid-19: Secondary | ICD-10-CM | POA: Diagnosis present

## 2020-12-23 DIAGNOSIS — N2581 Secondary hyperparathyroidism of renal origin: Secondary | ICD-10-CM | POA: Diagnosis present

## 2020-12-23 DIAGNOSIS — Z7901 Long term (current) use of anticoagulants: Secondary | ICD-10-CM | POA: Diagnosis not present

## 2020-12-23 LAB — CBC
HCT: 19.3 % — CL (ref 39.0–52.0)
HCT: 21 % — ABNORMAL LOW (ref 39.0–52.0)
HCT: 20.5 % — ABNORMAL LOW (ref 39.0–52.0)
Hemoglobin: 6.3 g/dL — CL (ref 13.0–17.0)
Hemoglobin: 6.4 g/dL — CL (ref 13.0–17.0)
Hemoglobin: 6.3 g/dL — CL (ref 13.0–17.0)
MCH: 29.4 pg (ref 26.0–34.0)
MCH: 29.4 pg (ref 26.0–34.0)
MCHC: 32.7 g/dL (ref 30.0–36.0)
MCHC: 30.5 g/dL (ref 30.0–36.0)
MCHC: 30.7 g/dL (ref 30.0–36.0)
MCV: 90.7 fl (ref 78.0–100.0)
MCV: 96.3 fL (ref 80.0–100.0)
MCV: 95.8 fL (ref 80.0–100.0)
Platelets: 379 10*3/uL (ref 150.0–400.0)
Platelets: 400 10*3/uL (ref 150–400)
Platelets: 384 10*3/uL (ref 150–400)
RBC: 2.13 Mil/uL — ABNORMAL LOW (ref 4.22–5.81)
RBC: 2.18 MIL/uL — ABNORMAL LOW (ref 4.22–5.81)
RBC: 2.14 MIL/uL — ABNORMAL LOW (ref 4.22–5.81)
RDW: 20.2 % — ABNORMAL HIGH (ref 11.5–15.5)
RDW: 19.5 % — ABNORMAL HIGH (ref 11.5–15.5)
RDW: 19.5 % — ABNORMAL HIGH (ref 11.5–15.5)
WBC: 12 10*3/uL — ABNORMAL HIGH (ref 4.0–10.5)
WBC: 10.8 10*3/uL — ABNORMAL HIGH (ref 4.0–10.5)
WBC: 12.4 10*3/uL — ABNORMAL HIGH (ref 4.0–10.5)
nRBC: 0 % (ref 0.0–0.2)
nRBC: 0 % (ref 0.0–0.2)

## 2020-12-23 LAB — COMPREHENSIVE METABOLIC PANEL
ALT: 19 U/L (ref 0–53)
ALT: 21 U/L (ref 0–44)
AST: 15 U/L (ref 0–37)
AST: 16 U/L (ref 15–41)
Albumin: 4.1 g/dL (ref 3.5–5.2)
Albumin: 3.4 g/dL — ABNORMAL LOW (ref 3.5–5.0)
Alkaline Phosphatase: 99 U/L (ref 39–117)
Alkaline Phosphatase: 87 U/L (ref 38–126)
Anion gap: 10 (ref 5–15)
BUN: 58 mg/dL — ABNORMAL HIGH (ref 6–23)
BUN: 49 mg/dL — ABNORMAL HIGH (ref 8–23)
CO2: 20 mEq/L (ref 19–32)
CO2: 21 mmol/L — ABNORMAL LOW (ref 22–32)
Calcium: 8.6 mg/dL (ref 8.4–10.5)
Calcium: 8.6 mg/dL — ABNORMAL LOW (ref 8.9–10.3)
Chloride: 106 mEq/L (ref 96–112)
Chloride: 105 mmol/L (ref 98–111)
Creatinine, Ser: 3.76 mg/dL — ABNORMAL HIGH (ref 0.40–1.50)
Creatinine, Ser: 3.72 mg/dL — ABNORMAL HIGH (ref 0.61–1.24)
GFR, Estimated: 16 mL/min — ABNORMAL LOW (ref >60)
GFR: 15.24 mL/min — ABNORMAL LOW (ref >60.00)
Glucose, Bld: 90 mg/dL (ref 70–99)
Glucose, Bld: 218 mg/dL — ABNORMAL HIGH (ref 70–99)
Potassium: 4.6 mEq/L (ref 3.5–5.1)
Potassium: 4.4 mmol/L (ref 3.5–5.1)
Sodium: 139 mEq/L (ref 135–145)
Sodium: 136 mmol/L (ref 135–145)
Total Bilirubin: 0.4 mg/dL (ref 0.2–1.2)
Total Bilirubin: 0.5 mg/dL (ref 0.3–1.2)
Total Protein: 6.8 g/dL (ref 6.0–8.3)
Total Protein: 7 g/dL (ref 6.5–8.1)

## 2020-12-23 LAB — ABO/RH: ABO/RH(D): A POS

## 2020-12-23 LAB — RETICULOCYTES
Immature Retic Fract: 12 % (ref 2.3–15.9)
RBC.: 2.27 MIL/uL — ABNORMAL LOW (ref 4.22–5.81)
Retic Count, Absolute: 109.9 10*3/uL (ref 19.0–186.0)
Retic Ct Pct: 4.8 % — ABNORMAL HIGH (ref 0.4–3.1)

## 2020-12-23 LAB — BRAIN NATRIURETIC PEPTIDE: B Natriuretic Peptide: 675.1 pg/mL — ABNORMAL HIGH (ref 0.0–100.0)

## 2020-12-23 LAB — VITAMIN B12: Vitamin B-12: 982 pg/mL — ABNORMAL HIGH (ref 180–914)

## 2020-12-23 LAB — POC OCCULT BLOOD, ED: Fecal Occult Bld: POSITIVE — AB

## 2020-12-23 LAB — URINALYSIS, ROUTINE W REFLEX MICROSCOPIC
Bacteria, UA: NONE SEEN
Bilirubin Urine: NEGATIVE
Glucose, UA: >=500 mg/dL — AB
Hgb urine dipstick: NEGATIVE
Ketones, ur: NEGATIVE mg/dL
Leukocytes,Ua: NEGATIVE
Nitrite: NEGATIVE
Protein, ur: 100 mg/dL — AB
Specific Gravity, Urine: 1.014 (ref 1.005–1.030)
pH: 5 (ref 5.0–8.0)

## 2020-12-23 LAB — IRON AND TIBC
Iron: 38 ug/dL — ABNORMAL LOW (ref 45–182)
Saturation Ratios: 14 % — ABNORMAL LOW (ref 17.9–39.5)
TIBC: 274 ug/dL (ref 250–450)
UIBC: 236 ug/dL

## 2020-12-23 LAB — LIPID PANEL
Cholesterol: 147 mg/dL (ref 0–200)
HDL: 70.7 mg/dL (ref >39.00)
LDL Cholesterol: 48 mg/dL (ref 0–99)
NonHDL: 76.56
Total CHOL/HDL Ratio: 2
Triglycerides: 142 mg/dL (ref 0.0–149.0)
VLDL: 28.4 mg/dL (ref 0.0–40.0)

## 2020-12-23 LAB — PROTIME-INR
INR: 1.1 (ref 0.8–1.2)
Prothrombin Time: 14.1 seconds (ref 11.4–15.2)

## 2020-12-23 LAB — GLUCOSE, CAPILLARY: Glucose-Capillary: 176 mg/dL — ABNORMAL HIGH (ref 70–99)

## 2020-12-23 LAB — RESP PANEL BY RT-PCR (FLU A&B, COVID) ARPGX2
Influenza A by PCR: NEGATIVE
Influenza B by PCR: NEGATIVE
SARS Coronavirus 2 by RT PCR: NEGATIVE

## 2020-12-23 LAB — PREPARE RBC (CROSSMATCH)

## 2020-12-23 LAB — FOLATE: Folate: 16.9 ng/mL (ref >5.9)

## 2020-12-23 LAB — FERRITIN: Ferritin: 581 ng/mL — ABNORMAL HIGH (ref 24–336)

## 2020-12-23 LAB — TSH: TSH: 3.8 u[IU]/mL (ref 0.35–4.50)

## 2020-12-23 MED ORDER — SODIUM CHLORIDE 0.9% FLUSH
3.0000 mL | Freq: Two times a day (BID) | INTRAVENOUS | Status: DC
  Administered 2020-12-24 – 2020-12-27 (×7): 3 mL via INTRAVENOUS

## 2020-12-23 MED ORDER — PANTOPRAZOLE SODIUM 40 MG IV SOLR
40.0000 mg | INTRAVENOUS | Status: DC
  Administered 2020-12-24: 40 mg via INTRAVENOUS
  Filled 2020-12-23: qty 40

## 2020-12-23 MED ORDER — SODIUM CHLORIDE 0.9 % IV SOLN: 250.0000 mL | INTRAVENOUS | Status: DC | PRN

## 2020-12-23 MED ORDER — SODIUM CHLORIDE 0.9 % IV SOLN: 10.0000 mL/h | Freq: Once | INTRAVENOUS | Status: DC

## 2020-12-23 MED ORDER — INSULIN ASPART 100 UNIT/ML IJ SOLN
0.0000 [IU] | Freq: Three times a day (TID) | INTRAMUSCULAR | Status: DC
  Administered 2020-12-24 (×2): 3 [IU] via SUBCUTANEOUS
  Administered 2020-12-25: 2 [IU] via SUBCUTANEOUS
  Administered 2020-12-25: 1 [IU] via SUBCUTANEOUS
  Administered 2020-12-25: 2 [IU] via SUBCUTANEOUS
  Administered 2020-12-26 (×3): 1 [IU] via SUBCUTANEOUS
  Administered 2020-12-27 (×2): 2 [IU] via SUBCUTANEOUS
  Administered 2020-12-27: 1 [IU] via SUBCUTANEOUS

## 2020-12-23 MED ORDER — SODIUM CHLORIDE 0.9% FLUSH: 3.0000 mL | INTRAVENOUS | Status: DC | PRN

## 2020-12-23 MED ORDER — NICOTINE 7 MG/24HR TD PT24
7.0000 mg | MEDICATED_PATCH | Freq: Every day | TRANSDERMAL | Status: DC
  Administered 2020-12-24 – 2020-12-27 (×3): 7 mg via TRANSDERMAL
  Filled 2020-12-23 (×5): qty 1

## 2020-12-23 MED ORDER — PANTOPRAZOLE 80MG IVPB - SIMPLE MED
80.0000 mg | Freq: Once | INTRAVENOUS | Status: AC
  Administered 2020-12-23: 80 mg via INTRAVENOUS
  Filled 2020-12-23: qty 100

## 2020-12-23 MED ORDER — ACETAMINOPHEN 325 MG PO TABS: 650.0000 mg | ORAL_TABLET | Freq: Four times a day (QID) | ORAL | Status: DC | PRN

## 2020-12-23 MED ORDER — ACETAMINOPHEN 650 MG RE SUPP: 650.0000 mg | Freq: Four times a day (QID) | RECTAL | Status: DC | PRN

## 2020-12-23 NOTE — Progress Notes (Signed)
Asked to eval pt of Dr Jillyn Hidden who is in ED w symptomatic anemia.  Hgb 6.3.  chronic Eliquis. Will plan to see pt tmrw.  Dr Rush Landmark aware  Azucena Freed PA-C

## 2020-12-23 NOTE — Telephone Encounter (Signed)
See lab notes.  Called patient's spouse, advised ER evaluation given drop in hemoglobin past 2 weeks Slightly worse renal function.  Symptomatic anemia likely with balance difficulties discussed yesterday.

## 2020-12-23 NOTE — Telephone Encounter (Signed)
Santiago Glad from [employer] called critical lab results  Hemoglobin 6.3 Hematocrit 19.3

## 2020-12-23 NOTE — H&P (Signed)
History and Physical    Adam Reid YQI:[id number] DOB: [birth year] DOA: 12/23/2020  PCP: [name], MD Consultants:  cardiology: Dr. Haroldine Laws, nephrology: [place] [address] Patient coming from:  Home - lives with wife   Chief Complaint: symptomatic anemia/fatigue. Hx from wife.   HPI: Adam Reid is a 75 y.o. male with medical history significant of HTN, DM2, CKD stage 4, paroxysmal afib on eliquis, non obstructive CAD, diastolic HF, ACD, BPH and depression who presented to ER after PCP called and told him his hgb was low and to come to ER. He was seen yesterday at PCP for regular follow up, but since last week he has had increased fatigue, legs are tingling and mad mood. He denies any shortness of breath at rest. His wife reports black stool since last week.  No other obvious bruising or bleeding. No chest pain, palpitations, headaches, vision changes. No cough, no N/V/D. No abdominal pain. He is eating and drinking normal. Normal urination.   Last colonoscopy: last year  EGD: last year. No family of colon cancer. No NSAIDs/steroid use. No history of GI bleed.  GI in 2021 with anemia and had upper and lower endoscopy w/o bleeding source.  ED Course: vitals: BP 136/70, HR: 77, RR: 18, afebrile and oxygen 100% on RA. Hgb found to be 6.3 PCP office. Hgb today is 6.4.  Fecal occult + here. ER physician has ordered 2 units of blood, given IV protonix and consulted GI. They will plan on seeing him.   Review of Systems: As per HPI; otherwise review of systems reviewed and negative.   Ambulatory Status:  Ambulates without assistance  COVID Vaccine Status: 2 vaccines and 1 booster. All pfizer.   Past Medical History:  Diagnosis Date   Anemia    CHF (congestive heart failure) (HCC)    Chronic kidney disease    Depression    Diabetes mellitus without complication ([place])    Hyperlipidemia    Hypertension    Thyroid disease    was on supplement, taken off by Dr  [name]    Past Surgical History:  Procedure Laterality Date   APPENDECTOMY     CARDIAC CATHETERIZATION N/A 01/30/2016   Procedure: Left Heart Cath and Coronary Angiography;  Surgeon: [name], MD;  Location: [facility];  Service: Cardiovascular;  Laterality: N/A;    Social History   Socioeconomic History   Marital status: Married    Spouse name: [name]   Number of children: 1   Years of education: 12th grade   Highest education level: Not on file  Occupational History   Occupation: Retired-accountant  Tobacco Use   Smoking status: Light Smoker    Packs/day: 0.10    Years: 48.00    Pack years: 4.80    Types: Cigarettes    Last attempt to quit: 07/16/2019    Years since quitting: 1.4   Smokeless tobacco: Never   Tobacco comments:    per pt he has one cigarette every couple days  Vaping Use   Vaping Use: Never used  Substance and Sexual Activity   Alcohol use: No    Alcohol/week: 0.0 standard drinks   Drug use: No   Sexual activity: Never    Partners: Female  Other Topics Concern   Not on file  Social History Narrative   Originally from [place]. Came to the [place] in 2009, following their son who came here for school.   Married.   Lives  with his wife.   Their adult son lives in [place], [place], where he is in optometry school.   Education: [employer]   Exercise: No   Social Determinants of Radio broadcast assistant Strain: Not on file  Food Insecurity: Not on file  Transportation Needs: Not on file  Physical Activity: Not on file  Stress: Not on file  Social Connections: Not on file  Intimate Partner Violence: Not on file    No Known Allergies  Family History  Problem Relation Age of Onset   Hypertension Mother    Hyperlipidemia Mother    Hypertension Sister    Kidney disease Father    Heart disease Brother 45       open heart surgery   Colon cancer Neg Hx     Prior to Admission medications   Medication Sig Start Date End Date Taking?  Authorizing Provider  amiodarone (PACERONE) 200 MG tablet Take 1 tablet (200 mg total) by mouth daily. Please call for office viist [phone] 10/27/20   Bensimhon, Shaune Pascal, MD  apixaban (ELIQUIS) 2.5 MG TABS tablet Take 1 tablet (2.5 mg total) by mouth 2 (two) times daily. 03/07/20   Bensimhon, Shaune Pascal, MD  atorvastatin (LIPITOR) 80 MG tablet TAKE 1 TABLET(80 MG) BY MOUTH DAILY 12/22/20   [name], MD  blood glucose meter kit and supplies Use up to two times daily as directed  ICD10 E10.9 E11.9 02/20/19   [name], MD  Blood Glucose Monitoring Suppl (ONETOUCH VERIO) w/Device KIT as directed.  09/03/15   [provider]  citalopram (CELEXA) 20 MG tablet TAKE 1 TABLET(20 MG) BY MOUTH DAILY 12/22/20   [name], MD  cyanocobalamin 500 MCG tablet Take 500 mcg by mouth daily. Reported on 12/02/2015    [provider]  dapagliflozin propanediol (FARXIGA) 5 MG TABS tablet Take 1 tablet (5 mg total) by mouth daily. 09/09/20   Bensimhon, Shaune Pascal, MD  diltiazem (CARDIZEM CD) 180 MG 24 hr capsule TAKE 1 CAPSULE(180 MG) BY MOUTH DAILY 03/20/20   Bensimhon, Shaune Pascal, MD  glipiZIDE (GLUCOTROL XL) 5 MG 24 hr tablet TAKE 1 TABLET(5 MG) BY MOUTH DAILY WITH BREAKFAST 10/20/20   [name], MD  GlucoCom Lancets MISC Use for home glucose monitoring 09/02/15   Harrison Mons, PA  glucose blood (ONETOUCH VERIO) test strip Test up to 2 times per day.  Uncontrolled diabetes with hyperglycemia and stage 4 CKD. 04/13/19   [name], MD  Omega 3-6-9 Fatty Acids (OMEGA 3-6-9 COMPLEX PO) Take 1 tablet by mouth daily.    [provider]  pantoprazole (PROTONIX) 40 MG tablet Take 1 tablet (40 mg total) by mouth daily. 12/25/19   Levin Erp, PA  polyethylene glycol (MIRALAX / GLYCOLAX) 17 g packet Take 17 g by mouth daily as needed for mild constipation or moderate constipation.     [provider]  senna (SENOKOT) 8.6 MG tablet Take 1 tablet by mouth  as needed for constipation.     [provider]  sitaGLIPtin (JANUVIA) 25 MG tablet TAKE 1 TABLET(25 MG) BY MOUTH DAILY 12/22/20   [name], MD  tamsulosin (FLOMAX) 0.4 MG CAPS capsule TAKE 2 CAPSULES(0.8 MG) BY MOUTH DAILY 01/08/20   [name], MD  torsemide (DEMADEX) 20 MG tablet Take 1 tablet (20 mg total) by mouth as needed. 03/07/20   Larey Dresser, MD    Physical Exam: Vitals:   12/23/20 1440 12/23/20  1700  BP: 136/70 (!) 158/69  Pulse: 77 73  Resp: 18 15  Temp: 98.5 F (36.9 C)   SpO2: 100% 97%     General:  Appears calm and comfortable and is in NAD. Pale color.  Eyes:  PERRL, EOMI, normal lids, iris ENT:  grossly normal hearing, lips & tongue, mmm; appropriate dentition Neck:  no LAD, masses or thyromegaly; no carotid bruits Cardiovascular:  RRR, no m/r/g. No LE edema.  Respiratory:   CTA bilaterally with no wheezes/rales/rhonchi.  Normal respiratory effort. Abdomen:  soft, NT, ND, NABS Back:   normal alignment, no CVAT Skin:  no rash or induration seen on limited exam Musculoskeletal:  grossly normal tone BUE/BLE, good ROM, no bony abnormality Lower extremity:  No LE edema.  Limited foot exam with no ulcerations.  2+ distal pulses. Psychiatric:  grossly normal mood and affect, speech fluent and appropriate, AOx3 Neurologic:  CN 2-12 grossly intact, moves all extremities in coordinated fashion, sensation intact    Radiological Exams on Admission: Independently reviewed - see discussion in A/P where applicable  No results found.  EKG: Independently reviewed.  NSR with rate 74; nonspecific ST changes with no evidence of acute ischemia. Borderline QT prolongation.    Labs on Admission: I have personally reviewed the available labs and imaging studies at the time of the admission.  Pertinent labs:  Hgb: 6.4/21 Glucose: 218 Creatinine: 3.72 and BUN: 49 (baseline: 3.3-3.4) Fecal occult: positive.     Assessment/Plan Principal  Problem:   Symptomatic anemia -hgb of 6.4. +fecal occult. Hgb was 9.1 two weeks ago.  Iron studies: % sat: 14 and iron of 38 ferritin: 581 -2 units of PRBC ordered in ER.  -will repeat CBC post transfusion, serial H&H -on tele  -Gi consulted and will see in AM.    Active Problems:   GI bleed -hgb of 6.4 with + fecal occult. Stable on exam.  -IV protonix 57m today, resume 421mIV tomorrow.  -GI consulted and will see in AM --liquid diet and NPO after midnight.     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  -transfuse and repeat renal function in AM  Paroxysmal atrial fibrillation -in NSR. Continue amiodarone.  -tele  -hold eliquis  Diastolic CHF and restrictive CM No signs of volume overload I/O, daily weights Echo 08/2019: EF of 55-60%. Normal LV function. Grade 3 diastolic dysfunction -on farxiga. Holding with decreased GFR and significant anemia.  -was followed by CHF clinic, but appears he has not been in awhile.  -bnp pending.      Diabetes mellitus with renal manifestations, controlled ([place]-a1c 6.0 yesterday at PCP -hold glipizide and  farxiga, continue januvia -SSI and accuchecks. Hypoglycemic precautions.     Hyperlipidemia LDL goal <100 and non obstructive CAD  -continue statin -lipid panel yesterday and LDL to goal at 48   Anemia of chronic disease Followed by nephrology, regular iron infusions.    BPH (benign prostatic hyperplasia) -continue home medication   Smoker  Nicotine patch and encourage smoking cessation.   Depression -recently stopped his celexa cold tu[place]out 2-3 weeks ago. PCP resumed yesterday, will continue here.    There is no height or weight on file to calculate BMI.     Level of care: Telemetry Medical DVT prophylaxis: SCDs Code Status:  Full - confirmed with  patient Family Communication:  wife at bedside [employer] Disposition Plan:  The patient is from: home    Anticipated d/c date will depend on clinical response to treatment and work up by GI.    Consults called: Gi in ER.  they will come see patient tomorrow.  Admission status:  inpatient    Orma Flaming MD [facility]   How to contact the [facility] Attending or Consulting provider [place] or covering provider during after hours [place], for this patient?  Check the care team in [facility] and look for a) attending/consulting [facility] provider listed and b) the [facility] team listed Log into [email or web address] and use [place]'s universal password to access. If you do not have the password, please contact the hospital operator. Locate the [facility] provider you are looking for under [facility] and page to a number that you can be directly reached. If you still have difficulty reaching the provider, please page the [facility] (Director on Call) for the Hospitalists listed on amion for assistance.   12/23/2020, 7:03 PM

## 2020-12-23 NOTE — ED Triage Notes (Signed)
Pt seen at PCP yesterday for feeling fatigue over the last 2 weeks. Pt has been receiving Iron transfusion here. They called them this am to tell them pt's hgb 6.3. advised spouse to bring him to the ED for further evaluation

## 2020-12-23 NOTE — ED Notes (Signed)
[name] Rufus (Spouse) asked to be called with every change at [phone]

## 2020-12-23 NOTE — ED Provider Notes (Signed)
Emergency Department Provider Note   I have reviewed the triage vital signs and the nursing notes.   HISTORY  Chief Complaint Fatigue  Wife provides interpretation for patient at their request.   HPI Adam Reid is a 75 y.o. male with PMH of IDA, CKD, and CHF on Eliquis presents to the ED with generalized weakness presents with worsening anemia found on PCP labs from yesterday. Patient has been seen by [facility] in 2021 with anemia and had upper and lower endoscopy w/o bleeding source. Has received iron transfusion with anemia presumed to be related to CKD. Patient has developed black stool per wife but was started on a medication recently for low iron and was told told that stool may become black.  They have noticed multiple black bowel movements.  Patient is on Eliquis.  He denies any abdominal or back pain.  He has been feeling very fatigued and weak which prompted the PCP visit for blood work.  The wife states she initially thought he would have low blood sugar but was called this morning saying that his blood counts were low and that he needed to present to the emergency department immediately.   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   Smoker 03/26/2016   Hypotension 01/29/2016   Abnormal myocardial perfusion study 01/29/2016   Depression 12/09/2015   Hyperlipidemia LDL goal <100 06/28/2014   Diabetes mellitus with renal manifestations,  controlled ([place])    HTN (hypertension) 06/24/2014   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  Family History  Problem Relation Age of Onset   Hypertension Mother    Hyperlipidemia Mother    Hypertension Sister    Kidney disease Father    Heart disease Brother 46       open heart surgery   Colon cancer Neg Hx     Social History Social History   Tobacco Use   Smoking status: Light Smoker    Packs/day: 0.10    Years: 48.00    Pack years: 4.80    Types: Cigarettes    Last attempt to quit: 07/16/2019    Years since quitting: 1.4   Smokeless tobacco: Never   Tobacco comments:    per pt he has one cigarette every couple days  Vaping Use   Vaping Use: Never used  Substance Use Topics   Alcohol use: No    Alcohol/week: 0.0 standard drinks   Drug use: No    Review of Systems  Constitutional: No fever/chills. Positive generalized weakness.  Eyes: No visual changes. ENT: No sore throat. Cardiovascular: Denies chest pain. Respiratory: Denies shortness of breath. Gastrointestinal: No abdominal pain.  No nausea, no vomiting.  No diarrhea.  No constipation. Positive black BMs.  Genitourinary: Negative for dysuria. Musculoskeletal: Negative for back pain. Skin: Negative for rash. Neurological: Negative for headaches, focal weakness or numbness.  10-point ROS otherwise negative.  ____________________________________________   PHYSICAL EXAM:  VITAL SIGNS: ED Triage Vitals  Enc Vitals Group     BP 12/23/20 1440 136/70     Pulse Rate 12/23/20 1440 77     Resp  12/23/20 1440 18     Temp 12/23/20 1440 98.5 F (36.9 C)     Temp src --      SpO2 12/23/20 1440 100 %   Constitutional: Alert and oriented. Well appearing and in no acute distress. Ambulatory without difficulty.  Eyes: Conjunctivae are normal. Head: Atraumatic. Nose: No congestion/rhinnorhea. Mouth/Throat: Mucous membranes are moist.   Neck: No stridor.   Cardiovascular: Normal rate, regular rhythm. Good peripheral circulation. Grossly normal heart sounds.   Respiratory: Normal respiratory effort.  No retractions. Lungs CTAB. Gastrointestinal: Soft and nontender. No distention.  Rectal exam performed with patient's verbal consent and nurse chaperone.  Dark brown stool on exam with some more black component as well although not frank melena.  Musculoskeletal: No gross deformities of extremities. Neurologic:  Normal speech and language. No gross focal neurologic deficits are appreciated.  Skin:  Skin is warm, dry and intact. No rash noted.  ____________________________________________   LABS (all labs ordered are listed, but only abnormal results are displayed)  Labs Reviewed  CBC - Abnormal; Notable for the following components:      Result Value   WBC 10.8 (*)    RBC 2.18 (*)    Hemoglobin 6.4 (*)    HCT 21.0 (*)    RDW 19.5 (*)    All other components within normal limits  COMPREHENSIVE METABOLIC PANEL - Abnormal; Notable for the following components:   CO2 21 (*)    Glucose, Bld 218 (*)    BUN 49 (*)    Creatinine, Ser 3.72 (*)    Calcium 8.6 (*)    Albumin 3.4 (*)    GFR, Estimated 16 (*)    All other components within normal limits  POC OCCULT BLOOD, ED - Abnormal; Notable for the following components:   Fecal Occult Bld POSITIVE (*)    All other components within normal limits  RESP PANEL BY RT-PCR (FLU A&B, COVID) ARPGX2  URINALYSIS, ROUTINE W REFLEX MICROSCOPIC  PROTIME-INR  VITAMIN B12  FOLATE  IRON AND TIBC  FERRITIN  RETICULOCYTES  TYPE AND SCREEN   ABO/RH  PREPARE RBC (CROSSMATCH)   ____________________________________________  EKG   EKG Interpretation  Date/Time:  Tuesday December 23 2020 17:01:09 EDT Ventricular Rate:  74 PR Interval:  179 QRS Duration: 90 QT Interval:  448 QTC Calculation: 498 R Axis:   35 Text Interpretation: Sinus rhythm Abnormal R-wave progression, early transition Borderline prolonged QT interval Confirmed by Nanda Quinton [phone]) on 12/23/2020 5:16:40 PM         ____________________________________________  RADIOLOGY  None   ____________________________________________   PROCEDURES  Procedure(s) performed:   Procedures  CRITICAL CARE Performed by: Margette Fast Total critical care time: 35 minutes Critical care time was exclusive of separately billable procedures and treating other patients. Critical care was necessary to treat or prevent imminent or life-threatening deterioration. Critical  care was time spent personally by me on the following activities: development of treatment plan with patient and/or surrogate as well as nursing, discussions with consultants, evaluation of patient's response to treatment, examination of patient, obtaining history from patient or surrogate, ordering and performing treatments and interventions, ordering and review of laboratory studies, ordering and review of radiographic studies, pulse oximetry and re-evaluation of patient's condition.  Nanda Quinton, MD Emergency Medicine  ____________________________________________   INITIAL IMPRESSION / ASSESSMENT AND PLAN / ED COURSE  Pertinent labs & imaging results that were available during my care of the patient were reviewed by me and considered in my medical decision making (see chart for details).   Patient presents to the emergency department with symptomatic anemia.  He does have chronic kidney disease which is likely contributing although has had some black bowel movements as well.  This could be  medication related but stool is Hemoccult positive on digital rectal exam here.  Hemodynamically the patient is stable.  Plan for Protonix, blood transfusion, GI consultation.  With GI bleeding on Eliquis and low hemoglobin likely require admission.   04:45 PM  Spoke with [facility]. They will consult during hospitalization.   Hb confirmed to be low here. CKD near baseline. PRBC and Protonix ordered. Patient remains HDS.   Discussed patient's case with [facility] to request admission. Patient and family (if present) updated with plan. Care transferred to [facility] service.  I reviewed all nursing notes, vitals, pertinent old records, EKGs, labs, imaging (as available).  ____________________________________________  FINAL CLINICAL IMPRESSION(S) / ED DIAGNOSES  Final diagnoses:  Symptomatic anemia  Gastrointestinal hemorrhage, unspecified gastrointestinal hemorrhage type    MEDICATIONS GIVEN DURING THIS VISIT:  Medications  pantoprazole (PROTONIX) 80 mg /NS 100 mL IVPB (has no administration in time range)  0.9 %  sodium chloride infusion (has no administration in time range)    Note:  This document was prepared using Dragon voice recognition software and may include unintentional dictation errors.  Nanda Quinton, MD, [facility] Emergency Medicine    Jasmia Angst, [facility], MD 12/23/20 [phone]

## 2020-12-23 NOTE — ED Notes (Signed)
Report called to Ivin Booty, RN receiving pt.

## 2020-12-24 ENCOUNTER — Encounter (HOSPITAL_COMMUNITY): Payer: Self-pay | Admitting: Family Medicine

## 2020-12-24 DIAGNOSIS — D649 Anemia, unspecified: Secondary | ICD-10-CM | POA: Diagnosis not present

## 2020-12-24 DIAGNOSIS — R195 Other fecal abnormalities: Secondary | ICD-10-CM

## 2020-12-24 DIAGNOSIS — D509 Iron deficiency anemia, unspecified: Secondary | ICD-10-CM

## 2020-12-24 LAB — CBC WITH DIFFERENTIAL/PLATELET
Abs Immature Granulocytes: 0.12 10*3/uL — ABNORMAL HIGH (ref 0.00–0.07)
Basophils Absolute: 0.1 10*3/uL (ref 0.0–0.1)
Basophils Relative: 0 %
Eosinophils Absolute: 0.1 10*3/uL (ref 0.0–0.5)
Eosinophils Relative: 0 %
HCT: 32.7 % — ABNORMAL LOW (ref 39.0–52.0)
Hemoglobin: 11 g/dL — ABNORMAL LOW (ref 13.0–17.0)
Immature Granulocytes: 1 %
Lymphocytes Relative: 2 %
Lymphs Abs: 0.4 10*3/uL — ABNORMAL LOW (ref 0.7–4.0)
MCH: 31.3 pg (ref 26.0–34.0)
MCHC: 33.6 g/dL (ref 30.0–36.0)
MCV: 92.9 fL (ref 80.0–100.0)
Monocytes Absolute: 0.7 10*3/uL (ref 0.1–1.0)
Monocytes Relative: 4 %
Neutro Abs: 17.9 10*3/uL — ABNORMAL HIGH (ref 1.7–7.7)
Neutrophils Relative %: 93 %
Platelets: 364 10*3/uL (ref 150–400)
RBC: 3.52 MIL/uL — ABNORMAL LOW (ref 4.22–5.81)
RDW: 18.3 % — ABNORMAL HIGH (ref 11.5–15.5)
WBC: 19.2 10*3/uL — ABNORMAL HIGH (ref 4.0–10.5)
nRBC: 0 % (ref 0.0–0.2)

## 2020-12-24 LAB — BASIC METABOLIC PANEL
Anion gap: 15 (ref 5–15)
BUN: 49 mg/dL — ABNORMAL HIGH (ref 8–23)
CO2: 18 mmol/L — ABNORMAL LOW (ref 22–32)
Calcium: 8.7 mg/dL — ABNORMAL LOW (ref 8.9–10.3)
Chloride: 107 mmol/L (ref 98–111)
Creatinine, Ser: 3.74 mg/dL — ABNORMAL HIGH (ref 0.61–1.24)
GFR, Estimated: 16 mL/min — ABNORMAL LOW (ref >60)
Glucose, Bld: 191 mg/dL — ABNORMAL HIGH (ref 70–99)
Potassium: 4.5 mmol/L (ref 3.5–5.1)
Sodium: 140 mmol/L (ref 135–145)

## 2020-12-24 LAB — CBC
HCT: 29.9 % — ABNORMAL LOW (ref 39.0–52.0)
Hemoglobin: 10 g/dL — ABNORMAL LOW (ref 13.0–17.0)
MCH: 30.7 pg (ref 26.0–34.0)
MCHC: 33.4 g/dL (ref 30.0–36.0)
MCV: 91.7 fL (ref 80.0–100.0)
Platelets: 352 10*3/uL (ref 150–400)
RBC: 3.26 MIL/uL — ABNORMAL LOW (ref 4.22–5.81)
RDW: 18.5 % — ABNORMAL HIGH (ref 11.5–15.5)
WBC: 17.3 10*3/uL — ABNORMAL HIGH (ref 4.0–10.5)
nRBC: 0 % (ref 0.0–0.2)

## 2020-12-24 LAB — GLUCOSE, CAPILLARY
Glucose-Capillary: 201 mg/dL — ABNORMAL HIGH (ref 70–99)
Glucose-Capillary: 108 mg/dL — ABNORMAL HIGH (ref 70–99)
Glucose-Capillary: 235 mg/dL — ABNORMAL HIGH (ref 70–99)
Glucose-Capillary: 79 mg/dL (ref 70–99)

## 2020-12-24 MED ORDER — TAMSULOSIN HCL 0.4 MG PO CAPS
0.4000 mg | ORAL_CAPSULE | Freq: Every day | ORAL | Status: DC
  Administered 2020-12-24 – 2020-12-27 (×4): 0.4 mg via ORAL
  Filled 2020-12-24 (×4): qty 1

## 2020-12-24 MED ORDER — AMIODARONE HCL 200 MG PO TABS
200.0000 mg | ORAL_TABLET | Freq: Every day | ORAL | Status: DC
  Administered 2020-12-24 – 2020-12-27 (×3): 200 mg via ORAL
  Filled 2020-12-24 (×4): qty 1

## 2020-12-24 MED ORDER — SODIUM CHLORIDE 0.9 % IV SOLN: INTRAVENOUS | Status: DC

## 2020-12-24 MED ORDER — CITALOPRAM HYDROBROMIDE 20 MG PO TABS
20.0000 mg | ORAL_TABLET | Freq: Every day | ORAL | Status: DC
  Administered 2020-12-24 – 2020-12-27 (×3): 20 mg via ORAL
  Filled 2020-12-24 (×4): qty 1

## 2020-12-24 MED ORDER — TORSEMIDE 20 MG PO TABS
20.0000 mg | ORAL_TABLET | Freq: Every day | ORAL | Status: DC | PRN
  Filled 2020-12-24: qty 1

## 2020-12-24 MED ORDER — LINAGLIPTIN 5 MG PO TABS
5.0000 mg | ORAL_TABLET | Freq: Every day | ORAL | Status: DC
  Administered 2020-12-24 – 2020-12-27 (×3): 5 mg via ORAL
  Filled 2020-12-24 (×4): qty 1

## 2020-12-24 MED ORDER — ATORVASTATIN CALCIUM 80 MG PO TABS
80.0000 mg | ORAL_TABLET | Freq: Every day | ORAL | Status: DC
  Administered 2020-12-24 – 2020-12-26 (×3): 80 mg via ORAL
  Filled 2020-12-24 (×3): qty 1

## 2020-12-24 MED ORDER — LABETALOL HCL 5 MG/ML IV SOLN: 5.0000 mg | INTRAVENOUS | Status: DC | PRN

## 2020-12-24 MED ORDER — DILTIAZEM HCL ER COATED BEADS 120 MG PO CP24
120.0000 mg | ORAL_CAPSULE | Freq: Every day | ORAL | Status: DC
  Administered 2020-12-24: 120 mg via ORAL
  Filled 2020-12-24: qty 1

## 2020-12-24 MED ORDER — POLYETHYLENE GLYCOL 3350 17 G PO PACK
34.0000 g | PACK | Freq: Once | ORAL | Status: AC
  Administered 2020-12-24: 34 g via ORAL
  Filled 2020-12-24: qty 2

## 2020-12-24 MED ORDER — SENNA 8.6 MG PO TABS: 1.0000 | ORAL_TABLET | Freq: Every day | ORAL | Status: DC | PRN

## 2020-12-24 NOTE — Progress Notes (Signed)
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 +Occult stool.  Iron 38, ferritin 581. Chronic anemia due to iron deficiency and renal disease Transfused 2 units pRBC's on admission, ordered in ED --GI consulted - see their recs --Trend Hbg, transfuse if < 7.0 --Telemetry monitoring --GI plans for small bowel enteroscopy, possible video capsule --Oral PPI, no need for IV per GI  Hypertension -chronic, stable.  Continue home meds  Chronic diastolic CHF -Echo 12/7891: EF 55 to 81%, grade 3 diastolic dysfunction. -- Hold Farxiga in setting of worsening renal function and significant anemia --Appears he used to follow with heart failure clinic but not but in a while Clinically appears euvolemic and compensated  Paroxysmal A. Fib -rate controlled, in normal sinus rhythm.  Continue amiodarone.  Hold Eliquis in setting of above.  Telemetry monitoring.  CKD stage IV -diabetic nephropathy.  Monitor renal function.  Avoid nephrotoxins and renally dose meds when indicated  Type 2 diabetes with renal manifestations -A1c 6.0.  Well controlled.  Hold glipizide and Farxiga.  Continue Januvia.  Sliding scale NovoLog with Accu-Cheks.  Hypoglycemia protocol.  Hyperlipidemia /nonobstructive CAD -continue statin .  LDL is at goal at 14  BPH -  cont home med  Current smoker - nicotine patch  Depression - recently stopped Celexa on his own 2-3 weeks ago.  PCP resumed at visit day before admission.  Continue Celexa.  Patient BMI: Body mass index is 21.71 kg/m.   DVT prophylaxis: SCDs Start: 12/23/20 1850   Diet:  Diet Orders (From admission, onward)     Start     Ordered   12/25/20 0001  Diet NPO time specified Except for: Sips with Meds  Diet effective midnight       Question:  Except for  Answer:  Sips with Meds   12/24/20 1651   12/24/20 1500  Diet clear liquid Room service appropriate? Yes; Fluid consistency: Thin  Diet effective now       Question Answer Comment  Room service appropriate? Yes   Fluid consistency: Thin      12/24/20 1500              Code Status: Full Code   Brief Narrative / Hospital Course to Date:   Per H&P by Dr. Rogers Blocker: "Adam Reid is a 75 y.o. male with medical history significant of HTN, DM2, CKD stage 4, paroxysmal afib on eliquis, non obstructive CAD, diastolic HF, ACD, BPH and depression who presented to ER after PCP called and told him his hgb was low and to come to ER. He was seen yesterday at PCP for regular follow up, but since last week he has had increased fatigue, legs are tingling and mad mood. He denies any shortness of breath  at rest. His wife reports black stool since last week.  No other obvious bruising or bleeding. No chest pain, palpitations, headaches, vision changes. No cough, no N/V/D. No abdominal pain. He is eating and drinking normal. Normal urination.    Last colonoscopy: last year  EGD: last year. No family of colon cancer. No NSAIDs/steroid use. No history of GI bleed. [facility] in 2021 with anemia and had upper and lower endoscopy w/o bleeding source.   ED Course: vitals: BP 136/70, HR: 77, RR: 18, afebrile and oxygen 100% on RA. Hgb found to be 6.3 PCP office. Hgb today is 6.4.  Fecal occult + here. ER physician has ordered 2 units of blood, given IV  protonix and consulted GI. They will plan on seeing him."  Admitted to [facility] service with Gastroenterology consulted for further evaluation of acute GI bleeding.    Subjective 12/24/20    Pt seen with assistance of video interpreter Adam Reid, ID# [phone]).  Pt reports feeling okay.  Denies any abdominal pain, N/V or signs of bleeding.  Has no complaints or concerns at this time.     Disposition Plan & Communication   Status is: Inpatient  Remains inpatient appropriate because:Ongoing active pain requiring inpatient pain management  Dispo: The patient is from: Home              Anticipated d/c is to: Home              Patient currently is not medically stable to d/c.   Difficult to place patient No   Consults, Procedures, Significant Events   Consultants:  Gastroenterology  Procedures:  Plan for small bowel enteroscopy  Antimicrobials:  Anti-infectives (From admission, onward)    None         Micro    Objective   Vitals:   12/24/20 0217 12/24/20 0444 12/24/20 1223 12/24/20 1802  BP: (!) 199/90 (!) 191/100 (!) 160/88 (!) 165/76  Pulse: 96 100 90 94  Resp: 18 17 16 16   Temp: 98.3 F (36.8 C) 98.1 F (36.7 C) 98.6 F (37 C) 99.1 F (37.3 C)  TempSrc: Oral Oral Oral Oral  SpO2: 90% 91% 92% 91%  Weight:      Height:        Intake/Output Summary (Last 24 hours) at 12/24/2020 1859 Last data filed at 12/24/2020 0525 Gross per 24 hour  Intake 761.5 ml  Output 1200 ml  Net -438.5 ml   Filed Weights   12/23/20 2017  Weight: 61 kg    Physical Exam:  General exam: awake, alert, no acute distress HEENT: atraumatic, clear conjunctiva, anicteric sclera, moist mucus membranes, hearing grossly normal  Respiratory system: CTAB, no wheezes, rales or rhonchi, normal respiratory effort. Cardiovascular system: normal S1/S2, RRR, no JVD, murmurs, rubs, gallops, no pedal edema.   Gastrointestinal system: soft, NT, ND, no HSM felt, +bowel sounds. Central nervous system:  A&O x3. no gross focal neurologic deficits, normal speech Extremities: moves all, no edema, normal tone Skin: dry, intact, normal temperature Psychiatry: normal mood, congruent affect, judgement and insight appear normal  Labs   Data Reviewed: I have personally reviewed following labs and imaging studies  CBC: Recent Labs  Lab 12/22/20 1524 12/23/20 1524 12/23/20 2018 12/24/20 0729 12/24/20 1659  WBC 12.0* 10.8* 12.4* 19.2* 17.3*  NEUTROABS  --   --   --  17.9*  --   HGB 6.3 Repeated and verified X2.* 6.4* 6.3* 11.0* 10.0*  HCT 19.3 Repeated and verified X2.* 21.0*  20.5* 32.7* 29.9*  MCV 90.7 96.3 95.8 92.9 91.7  PLT 379.0 400 384 364 409   Basic Metabolic Panel: Recent Labs  Lab 12/22/20 1524 12/23/20 1524 12/24/20 0729  NA 139 136 140  K 4.6 4.4 4.5  CL 106 105 107  CO2 20 21* 18*  GLUCOSE 90 218* 191*  BUN 58* 49* 49*  CREATININE 3.76* 3.72* 3.74*  CALCIUM 8.6 8.6* 8.7*   GFR: Estimated Creatinine Clearance: 15.2 mL/min (A) (by C-G formula based on SCr of 3.74 mg/dL (H)). Liver Function Tests: Recent Labs  Lab 12/22/20 1524 12/23/20 1524  AST 15 16  ALT 19 21  ALKPHOS 99 87  BILITOT 0.4 0.5  PROT 6.8 7.0  ALBUMIN 4.1 3.4*   No results for input(s): LIPASE, AMYLASE in the last 168 hours. No results for input(s): AMMONIA in the last 168 hours. Coagulation Profile: Recent Labs  Lab 12/23/20 1547  INR 1.1   Cardiac Enzymes: No results for input(s): CKTOTAL, CKMB, CKMBINDEX, TROPONINI in the last 168 hours. BNP (last 3 results) No results for input(s): PROBNP in the last 8760 hours. HbA1C: Recent Labs    12/22/20 1511  HGBA1C 6.0*   CBG: Recent Labs  Lab 12/23/20 2119 12/24/20 0614 12/24/20 1109 12/24/20 1616  GLUCAP 176* 201* 108* 235*   Lipid Profile: Recent Labs    12/22/20 1524  CHOL 147  HDL 70.70  LDLCALC 48  TRIG 142.0  CHOLHDL 2   Thyroid Function Tests: Recent Labs    12/22/20 1524  TSH 3.80   Anemia Panel: Recent  Labs    12/23/20 1653  VITAMINB12 982*  FOLATE 16.9  FERRITIN 581*  TIBC 274  IRON 38*  RETICCTPCT 4.8*   Sepsis Labs: No results for input(s): PROCALCITON, LATICACIDVEN in the last 168 hours.  Recent Results (from the past 240 hour(s))  Resp Panel by RT-PCR (Flu A&B, Covid) Nasopharyngeal Swab     Status: None   Collection Time: 12/23/20  5:29 PM   Specimen: Nasopharyngeal Swab; Nasopharyngeal(NP) swabs in vial transport medium  Result Value Ref Range Status   SARS Coronavirus 2 by RT PCR NEGATIVE NEGATIVE Final    Comment: (NOTE) SARS-CoV-2 target nucleic acids are NOT DETECTED.  The SARS-CoV-2 RNA is generally detectable in upper respiratory specimens during the acute phase of infection. The lowest concentration of SARS-CoV-2 viral copies this assay can detect is 138 copies/mL. A negative result does not preclude SARS-Cov-2 infection and should not be used as the sole basis for treatment or other patient management decisions. A negative result may occur with  improper specimen collection/handling, submission of specimen other than nasopharyngeal swab, presence of viral mutation(s) within the areas targeted by this assay, and inadequate number of viral copies(<138 copies/mL). A negative result must be combined with clinical observations, patient history, and epidemiological information. The expected result is Negative.  Fact Sheet for Patients:  EntrepreneurPulse.com.au  Fact Sheet for Healthcare Providers:  IncredibleEmployment.be  This test is no t yet approved or cleared by the [place] FDA and  has been authorized for detection and/or diagnosis of SARS-CoV-2 by FDA under an Emergency Use Authorization (EUA). This EUA will remain  in effect (meaning this test can be used) for the duration of the COVID-19 declaration under Section 564(b)(1) of the Act, 21 U.S.C.section 360bbb-3(b)(1), unless the authorization is terminated  or  revoked sooner.       Influenza A by PCR NEGATIVE NEGATIVE Final   Influenza B by PCR NEGATIVE NEGATIVE Final  Comment: (NOTE) The Xpert Xpress SARS-CoV-2/FLU/RSV plus assay is intended as an aid in the diagnosis of influenza from Nasopharyngeal swab specimens and should not be used as a sole basis for treatment. Nasal washings and aspirates are unacceptable for Xpert Xpress SARS-CoV-2/FLU/RSV testing.  Fact Sheet for Patients: EntrepreneurPulse.com.au  Fact Sheet for Healthcare Providers: IncredibleEmployment.be  This test is not yet approved or cleared by the [place] FDA and has been authorized for detection and/or diagnosis of SARS-CoV-2 by FDA under an Emergency Use Authorization (EUA). This EUA will remain in effect (meaning this test can be used) for the duration of the COVID-19 declaration under Section 564(b)(1) of the Act, 21 U.S.C. section 360bbb-3(b)(1), unless the authorization is terminated or revoked.  Performed at [facility], [place] [address] [place], [place] [address]       Imaging Studies   No results found.   Medications   Scheduled Meds:  amiodarone  200 mg Oral Daily   atorvastatin  80 mg Oral QHS   citalopram  20 mg Oral Daily   diltiazem  120 mg Oral Daily   insulin aspart  0-9 Units Subcutaneous TID WC   linagliptin  5 mg Oral Daily   nicotine  7 mg Transdermal Daily   polyethylene glycol  34 g Oral Once   sodium chloride flush  3 mL Intravenous Q12H   tamsulosin  0.4 mg Oral QPC supper   Continuous Infusions:  sodium chloride     sodium chloride         LOS: 1 day    Time spent: 30 minutes    Ezekiel Slocumb, DO [facility]  12/24/2020, 6:59 PM      If 7PM-7AM, please contact night-coverage. How to contact the [facility] Attending or Consulting provider [place] or covering provider during after hours [place], for this patient?    Check the care team in [facility] and look for  a) attending/consulting [facility] provider listed and b) the [facility] team listed Log into [email or web address] and use [place]'s universal password to access. If you do not have the password, please contact the hospital operator. Locate the [facility] provider you are looking for under [facility] and page to a number that you can be directly reached. If you still have difficulty reaching the provider, please page the [facility] (Director on Call) for the Hospitalists listed on amion for assistance.

## 2020-12-24 NOTE — Consult Note (Signed)
[facility] Consult: 9:43 AM 12/24/2020  LOS: 1 day    Referring Provider: Dr Nicole Kindred  Primary Care Physician:  [name], MD Primary Gastroenterologist:  Dr Lyndel Safe.        Reason for Consultation: Anemia.   HPI: Adam Reid is a 75 y.o. male.  PMH CKD stage 4.  Chronic anemia w previous iron deficiency.  Diastolic CHF.  Chronic Eliquis for atrial fibrillation.   07/2015 colonoscopy:  Screening study.  1.4 mm polyp removed from sigmoid.  Other 2 -6 mm polyps removed from a sending colon.  Nonbleeding internal hemorrhoids.  Polyp path: TAs w/o HGD.   11/2019 colonoscopy:  For history of polyps and IDA.  4 sessile polyps removed from the rectum, transverse, a sending and sigmoid colon.  Size ranged from 2 -8 mm.  Sigmoid diverticulosis and a few tics in the ascending colon.  Nonbleeding internal hemorrhoids.  Polyps were all tubular adenomas without HGD. 11/2019 EGD: Epigastric abdominal pain.  IDA.  Irregular Z-line at 40 cm from incisors was biopsied.  Incidental submucosal mid esophageal lesion, not biopsied.  Otherwise normal study to examined duodenum. 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  Gastric biopsies confirmed mild, chronic gastritis, no H. pylori.  Duodenal biopsies with peptic duodenitis.    Presents with at least a week of increased fatigue, tingling in his legs.  Denies shortness of breath, dizziness, syncope, abdominal pain, nausea.  Appetite is good.  Constipated for the last 3 or 4 days with no stool output but prior to that had seen some darker but not melenic or grossly bloody stools. Instructed to proceed to the ED after outpatient Hgb in the sixes.   At presentation  normotensive without tachycardia.  On recent blood pressure readings hypertensive with systolic pressures to 341, diastolic BPs to 962 Hgb 6.3 >> 2 PRBCs >> 11.  Was 12.1 in early April, 10.7 in late April and 9.1 three weeks ago.  MCV 90.   BUN/creatinine with slightly worsening GFR but stable BUN in the 40s to 50s.  INR 1.1.   FOBT +.   Last dose Eliquis was on Monday p.m. 6/13. Received Feraheme in 2021 and in recent months has had Feraheme infusions once or twice monthly under the guidelines of renal, Dr. [name].  Reviewing epic I do not see where he has received Epogen/CSA agents.  Compliant with daily pantoprazole.  Conversation was held using video based translation services.  Patient understands need for undergoing repeat endoscopy.      Past Medical History:  Diagnosis Date   Anemia    CHF (congestive heart failure) (HCC)    Chronic kidney disease    Depression    Diabetes mellitus without complication ([place])    Hyperlipidemia    Hypertension    Thyroid disease    was on supplement, taken off by [name]    Past Surgical History:  Procedure Laterality Date   APPENDECTOMY     CARDIAC CATHETERIZATION N/A 01/30/2016  Procedure: Left Heart Cath and Coronary Angiography;  Surgeon: Corky Crafts, MD;  Location: [facility] INVASIVE CV LAB;  Service: Cardiovascular;  Laterality: N/A;    Prior to Admission medications   Medication Sig Start Date End Date Taking? Authorizing Provider  amiodarone (PACERONE) 200 MG tablet Take 1 tablet (200 mg total) by mouth daily. Please call for office viist [phone] 10/27/20   Bensimhon, Bevelyn Buckles, MD  apixaban (ELIQUIS) 2.5 MG TABS tablet Take 1 tablet (2.5 mg total) by mouth 2 (two) times daily. 03/07/20   Bensimhon, Bevelyn Buckles, MD  atorvastatin (LIPITOR) 80 MG tablet TAKE 1 TABLET(80 MG) BY MOUTH DAILY 12/22/20   Shade Flood, MD  blood glucose meter kit and supplies Use up to two times daily as directed  ICD10 E10.9 E11.9 02/20/19   Shade Flood, MD  Blood Glucose Monitoring Suppl (ONETOUCH VERIO) w/Device KIT as directed.  09/03/15   [provider]  citalopram (CELEXA) 20 MG tablet TAKE 1 TABLET(20 MG) BY MOUTH DAILY 12/22/20   Shade Flood, MD  cyanocobalamin 500 MCG tablet Take 500 mcg by mouth daily. Reported on 12/02/2015    [provider]  dapagliflozin propanediol (FARXIGA) 5 MG TABS tablet Take 1 tablet (5 mg total) by mouth daily. 09/09/20   Bensimhon, Bevelyn Buckles, MD  diltiazem (CARDIZEM CD) 180 MG 24 hr capsule TAKE 1 CAPSULE(180 MG) BY MOUTH DAILY 03/20/20   Bensimhon, Bevelyn Buckles, MD  glipiZIDE (GLUCOTROL XL) 5 MG 24 hr tablet TAKE 1 TABLET(5 MG) BY MOUTH DAILY WITH BREAKFAST 10/20/20   Shade Flood, MD  GlucoCom Lancets MISC Use for home glucose monitoring 09/02/15   Porfirio Oar, PA  glucose blood (ONETOUCH VERIO) test strip Test up to 2 times per day.  Uncontrolled diabetes with hyperglycemia and stage 4 CKD. 04/13/19   Shade Flood, MD  Omega 3-6-9 Fatty Acids (OMEGA 3-6-9 COMPLEX PO) Take 1 tablet by mouth daily.    [provider]  pantoprazole (PROTONIX) 40 MG tablet Take 1 tablet (40 mg total) by mouth daily. 12/25/19   Unk Lightning, PA  polyethylene glycol (MIRALAX / GLYCOLAX) 17 g packet Take 17 g by mouth daily as needed for mild constipation or moderate constipation.     [provider]  senna (SENOKOT) 8.6 MG tablet Take 1 tablet by mouth as needed for constipation.     [provider]  sitaGLIPtin (JANUVIA) 25 MG tablet TAKE 1 TABLET(25 MG) BY MOUTH DAILY 12/22/20   Shade Flood, MD  tamsulosin (FLOMAX) 0.4 MG CAPS capsule TAKE 2 CAPSULES(0.8 MG) BY MOUTH DAILY 01/08/20   Shade Flood, MD  torsemide (DEMADEX) 20 MG tablet Take 1 tablet (20 mg total) by mouth as needed. 03/07/20   Laurey Morale, MD    Scheduled Meds:  amiodarone  200 mg Oral Daily   atorvastatin  80 mg Oral QHS   citalopram  20 mg Oral Daily   diltiazem  120 mg Oral  Daily   insulin aspart  0-9 Units Subcutaneous TID WC   linagliptin  5 mg Oral Daily   nicotine  7 mg Transdermal Daily   pantoprazole (PROTONIX) IV  40 mg Intravenous Q24H   sodium chloride flush  3 mL Intravenous Q12H   tamsulosin  0.4 mg Oral QPC supper   Infusions:  sodium chloride     sodium chloride     PRN Meds: sodium chloride, acetaminophen **OR** acetaminophen, labetalol, senna, sodium chloride flush, torsemide  Allergies as of 12/23/2020   (No Known Allergies)    Family History  Problem Relation Age of Onset   Hypertension Mother    Hyperlipidemia Mother    Hypertension Sister    Kidney disease Father    Heart disease Brother 37       open heart surgery   Colon cancer Neg Hx     Social History   Socioeconomic History   Marital status: Married    Spouse name: [name]   Number of children: 1   Years of education: 12th grade   Highest education level: Not on file  Occupational History   Occupation: Retired-accountant  Tobacco Use   Smoking status: Light Smoker    Packs/day: 0.10    Years: 48.00    Pack years: 4.80    Types: Cigarettes    Last attempt to quit: 07/16/2019    Years since quitting: 1.4   Smokeless tobacco: Never   Tobacco comments:    per pt he has one cigarette every couple days  Vaping Use   Vaping Use: Never used  Substance and Sexual Activity   Alcohol use: No    Alcohol/week: 0.0 standard drinks   Drug use: No   Sexual activity: Never    Partners: Female  Other Topics Concern   Not on file  Social History Narrative   Originally from [place]. Came to the [place] in 2009, following their son who came here for school.   Married.   Lives with his wife.   Their adult son lives in [place], [place], where he is in optometry school.   Education: [employer]   Exercise: No   Social Determinants of Radio broadcast assistant Strain: Not on file  Food Insecurity: Not on file  Transportation Needs: Not on file  Physical Activity: Not on file   Stress: Not on file  Social Connections: Not on file  Intimate Partner Violence: Not on file    REVIEW OF SYSTEMS: Constitutional: No profound weakness or fatigue. ENT:  No nose bleeds Pulm: Denies dyspnea on exertion.  No cough. CV:  No palpitations, no angina.  Some lower extremity swelling is not severe. GU:  No hematuria, no frequency GI: See HPI. Heme: Denies excessive or unusual bleeding or bruising. Transfusions: See HPI. Neuro:  No headaches, no visual disturbances.  Tingling in his legs. Derm:  No itching, no rash or sores.  Endocrine:  No sweats or chills.  No polyuria or dysuria Immunization: Vaccine history reviewed.  He is received Pfizer COVID-19 vaccination in early 2021, booster not documented and did not ask him about COVID vaccinations. Travel:  None beyond local counties in last few months.    PHYSICAL EXAM: Vital signs in last 24 hours: Vitals:   12/24/20 0217 12/24/20 0444  BP: (!) 199/90 (!) 191/100  Pulse: 96 100  Resp: 18 17  Temp: 98.3 F (36.8 C) 98.1 F (36.7 C)  SpO2: 90% 91%   Wt Readings from Last 3 Encounters:  12/23/20 61 kg  12/22/20 62 kg  06/19/20 62.1 kg    General: Pleasant, well-appearing, elderly, alert, comfortable. Head: No facial asymmetry or swelling.  No signs of head trauma. Eyes: Conjunctiva is pink.  No scleral icterus.  EOMI.  Arcus senilis. Ears: Not hard of hearing Nose: No congestion or discharge. Mouth: Moist, pink, clear oropharynx.  Poor dentition.  Tongue midline. Neck: No JVD, no masses, no thyromegaly. Lungs: Clear bilaterally.  No labored breathing.  No cough. Heart: NSR,  regular rate.  No MRG.  S1, S2 audible. Abdomen: Nonobese.  No masses, HSM, bruits, hernias.  No tenderness.  Bowel sounds active..   Rectal: A little bit tender at the rectum which has normal to increased tone.  Scant amount of stool, too small to really say what color it is, tests FOBT positive. Musc/Skeltl: No significant joint  distortion, swelling Extremities: No CCE. Neurologic: Alert.  Appropriate.  Oriented x3.  Moves all 4 limbs without tremor or gross weakness.  Able to transfer without any assistance from the bedside chair onto the bed and walk to the bathroom. Skin: No telangiectasia, no suspicious lesions, no rashes, no significant bruising or purpura. Nodes: No cervical adenopathy Psych: Cooperative, calm, pleasant.  Intake/Output from previous day: 06/14 0701 - 06/15 0700 In: 761.5 [P.O.:360; Blood:401.5] Out: 1200 [Urine:1200] Intake/Output this shift: No intake/output data recorded.  LAB RESULTS: Recent Labs    12/23/20 1524 12/23/20 2018 12/24/20 0729  WBC 10.8* 12.4* 19.2*  HGB 6.4* 6.3* 11.0*  HCT 21.0* 20.5* 32.7*  PLT 400 384 364   BMET Lab Results  Component Value Date   NA 140 12/24/2020   NA 136 12/23/2020   NA 139 12/22/2020   K 4.5 12/24/2020   K 4.4 12/23/2020   K 4.6 12/22/2020   CL 107 12/24/2020   CL 105 12/23/2020   CL 106 12/22/2020   CO2 18 (L) 12/24/2020   CO2 21 (L) 12/23/2020   CO2 20 12/22/2020   GLUCOSE 191 (H) 12/24/2020   GLUCOSE 218 (H) 12/23/2020   GLUCOSE 90 12/22/2020   BUN 49 (H) 12/24/2020   BUN 49 (H) 12/23/2020   BUN 58 (H) 12/22/2020   CREATININE 3.74 (H) 12/24/2020   CREATININE 3.72 (H) 12/23/2020   CREATININE 3.76 (H) 12/22/2020   CALCIUM 8.7 (L) 12/24/2020   CALCIUM 8.6 (L) 12/23/2020   CALCIUM 8.6 12/22/2020   LFT Recent Labs    12/22/20 1524 12/23/20 1524  PROT 6.8 7.0  ALBUMIN 4.1 3.4*  AST 15 16  ALT 19 21  ALKPHOS 99 87  BILITOT 0.4 0.5   PT/INR Lab Results  Component Value Date   INR 1.1 12/23/2020   INR 1.02 01/29/2016   Hepatitis Panel No results for input(s): HEPBSAG, HCVAB, HEPAIGM, HEPBIGM in the last 72 hours. C-Diff No components found for: CDIFF Lipase  No results found for: LIPASE  Drugs of Abuse  No results found for: LABOPIA, COCAINSCRNUR, LABBENZ, AMPHETMU, THCU, LABBARB   RADIOLOGY  STUDIES: No results found.   IMPRESSION:      Acute on chronic anemia with previously confirmed iron deficiency.  Surprisingly not that symptomatic.  Has had adenomatous colon polyps, diverticulosis and hemorrhoids on 2 previous colonoscopies.  Latest colonoscopy May 2021.  At May 2021 EGD had intestinal metaplasia at distal esophagus/Z-line and mild chronic gastritis, peptic duodenitis.  Compliant with daily Protonix.   Under renal Dr. Serita Grit care is receiving regular, once to twice monthly parenteral iron but not clear he is getting any CSA agents administered.  I suspect the anemia is multifactorial including blood loss as well as advanced kidney disease.  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        good response to 2 PRBCs.      FOBT positive stool.  This is not a aggressive GI bleed in that he has not had a bowel movement for 3 or 4 days but does report previous/recent darker than usual stools.  Late stage IV  CKD.     Chronic Eliquis for history of A. fib.  Not currently in A. fib.  Last Eliquis was in the afternoon/evening of 6/13.     NIDDM    Hx diastolic CHF, currently well compensated.    PLAN:         Pt d/w Dr Jerilynn Mages.   Plan is for SBE and placement of video capsule tomorrow.  He can eat solid food today but clear liquids for dinner tonight and a mild laxative regimen to get him ready for the capsule study.  Recheck CBC  Unnecessary to obtain hemoglobins every 4 hours.  Reconfigured orders for CBC q. 5 a/5 PM.    Resume oral PPI, IV not necessary   Azucena Freed  12/24/2020, 9:43 AM Phone [phone]

## 2020-12-24 NOTE — H&P (View-Only) (Signed)
[facility] Consult: 9:43 AM 12/24/2020  LOS: 1 day    Referring Provider: Dr Nicole Kindred  Primary Care Physician:  [name], MD Primary Gastroenterologist:  Dr Lyndel Safe.        Reason for Consultation: Anemia.   HPI: Adam Reid is a 75 y.o. male.  PMH CKD stage 4.  Chronic anemia w previous iron deficiency.  Diastolic CHF.  Chronic Eliquis for atrial fibrillation.   07/2015 colonoscopy:  Screening study.  1.4 mm polyp removed from sigmoid.  Other 2 -6 mm polyps removed from a sending colon.  Nonbleeding internal hemorrhoids.  Polyp path: TAs w/o HGD.   11/2019 colonoscopy:  For history of polyps and IDA.  4 sessile polyps removed from the rectum, transverse, a sending and sigmoid colon.  Size ranged from 2 -8 mm.  Sigmoid diverticulosis and a few tics in the ascending colon.  Nonbleeding internal hemorrhoids.  Polyps were all tubular adenomas without HGD. 11/2019 EGD: Epigastric abdominal pain.  IDA.  Irregular Z-line at 40 cm from incisors was biopsied.  Incidental submucosal mid esophageal lesion, not biopsied.  Otherwise normal study to examined duodenum. 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  Gastric biopsies confirmed mild, chronic gastritis, no H. pylori.  Duodenal biopsies with peptic duodenitis.    Presents with at least a week of increased fatigue, tingling in his legs.  Denies shortness of breath, dizziness, syncope, abdominal pain, nausea.  Appetite is good.  Constipated for the last 3 or 4 days with no stool output but prior to that had seen some darker but not melenic or grossly bloody stools. Instructed to proceed to the ED after outpatient Hgb in the sixes.   At presentation  normotensive without tachycardia.  On recent blood pressure readings hypertensive with systolic pressures to 540, diastolic BPs to 086 Hgb 6.3 >> 2 PRBCs >> 11.  Was 12.1 in early April, 10.7 in late April and 9.1 three weeks ago.  MCV 90.   BUN/creatinine with slightly worsening GFR but stable BUN in the 40s to 50s.  INR 1.1.   FOBT +.   Last dose Eliquis was on Monday p.m. 6/13. Received Feraheme in 2021 and in recent months has had Feraheme infusions once or twice monthly under the guidelines of renal, Dr. [name].  Reviewing epic I do not see where he has received Epogen/CSA agents.  Compliant with daily pantoprazole.  Conversation was held using video based translation services.  Patient understands need for undergoing repeat endoscopy.      Past Medical History:  Diagnosis Date   Anemia    CHF (congestive heart failure) (HCC)    Chronic kidney disease    Depression    Diabetes mellitus without complication ([place])    Hyperlipidemia    Hypertension    Thyroid disease    was on supplement, taken off by [name]    Past Surgical History:  Procedure Laterality Date   APPENDECTOMY     CARDIAC CATHETERIZATION N/A 01/30/2016  Procedure: Left Heart Cath and Coronary Angiography;  Surgeon: Corky Crafts, MD;  Location: [facility] INVASIVE CV LAB;  Service: Cardiovascular;  Laterality: N/A;    Prior to Admission medications   Medication Sig Start Date End Date Taking? Authorizing Provider  amiodarone (PACERONE) 200 MG tablet Take 1 tablet (200 mg total) by mouth daily. Please call for office viist [phone] 10/27/20   Bensimhon, Bevelyn Buckles, MD  apixaban (ELIQUIS) 2.5 MG TABS tablet Take 1 tablet (2.5 mg total) by mouth 2 (two) times daily. 03/07/20   Bensimhon, Bevelyn Buckles, MD  atorvastatin (LIPITOR) 80 MG tablet TAKE 1 TABLET(80 MG) BY MOUTH DAILY 12/22/20   Shade Flood, MD  blood glucose meter kit and supplies Use up to two times daily as directed  ICD10 E10.9 E11.9 02/20/19   Shade Flood, MD  Blood Glucose Monitoring Suppl (ONETOUCH VERIO) w/Device KIT as directed.  09/03/15   [provider]  citalopram (CELEXA) 20 MG tablet TAKE 1 TABLET(20 MG) BY MOUTH DAILY 12/22/20   Shade Flood, MD  cyanocobalamin 500 MCG tablet Take 500 mcg by mouth daily. Reported on 12/02/2015    [provider]  dapagliflozin propanediol (FARXIGA) 5 MG TABS tablet Take 1 tablet (5 mg total) by mouth daily. 09/09/20   Bensimhon, Bevelyn Buckles, MD  diltiazem (CARDIZEM CD) 180 MG 24 hr capsule TAKE 1 CAPSULE(180 MG) BY MOUTH DAILY 03/20/20   Bensimhon, Bevelyn Buckles, MD  glipiZIDE (GLUCOTROL XL) 5 MG 24 hr tablet TAKE 1 TABLET(5 MG) BY MOUTH DAILY WITH BREAKFAST 10/20/20   Shade Flood, MD  GlucoCom Lancets MISC Use for home glucose monitoring 09/02/15   Porfirio Oar, PA  glucose blood (ONETOUCH VERIO) test strip Test up to 2 times per day.  Uncontrolled diabetes with hyperglycemia and stage 4 CKD. 04/13/19   Shade Flood, MD  Omega 3-6-9 Fatty Acids (OMEGA 3-6-9 COMPLEX PO) Take 1 tablet by mouth daily.    [provider]  pantoprazole (PROTONIX) 40 MG tablet Take 1 tablet (40 mg total) by mouth daily. 12/25/19   Unk Lightning, PA  polyethylene glycol (MIRALAX / GLYCOLAX) 17 g packet Take 17 g by mouth daily as needed for mild constipation or moderate constipation.     [provider]  senna (SENOKOT) 8.6 MG tablet Take 1 tablet by mouth as needed for constipation.     [provider]  sitaGLIPtin (JANUVIA) 25 MG tablet TAKE 1 TABLET(25 MG) BY MOUTH DAILY 12/22/20   Shade Flood, MD  tamsulosin (FLOMAX) 0.4 MG CAPS capsule TAKE 2 CAPSULES(0.8 MG) BY MOUTH DAILY 01/08/20   Shade Flood, MD  torsemide (DEMADEX) 20 MG tablet Take 1 tablet (20 mg total) by mouth as needed. 03/07/20   Laurey Morale, MD    Scheduled Meds:  amiodarone  200 mg Oral Daily   atorvastatin  80 mg Oral QHS   citalopram  20 mg Oral Daily   diltiazem  120 mg Oral  Daily   insulin aspart  0-9 Units Subcutaneous TID WC   linagliptin  5 mg Oral Daily   nicotine  7 mg Transdermal Daily   pantoprazole (PROTONIX) IV  40 mg Intravenous Q24H   sodium chloride flush  3 mL Intravenous Q12H   tamsulosin  0.4 mg Oral QPC supper   Infusions:  sodium chloride     sodium chloride     PRN Meds: sodium chloride, acetaminophen **OR** acetaminophen, labetalol, senna, sodium chloride flush, torsemide  Allergies as of 12/23/2020   (No Known Allergies)    Family History  Problem Relation Age of Onset   Hypertension Mother    Hyperlipidemia Mother    Hypertension Sister    Kidney disease Father    Heart disease Brother 45       open heart surgery   Colon cancer Neg Hx     Social History   Socioeconomic History   Marital status: Married    Spouse name: [name]   Number of children: 1   Years of education: 12th grade   Highest education level: Not on file  Occupational History   Occupation: Retired-accountant  Tobacco Use   Smoking status: Light Smoker    Packs/day: 0.10    Years: 48.00    Pack years: 4.80    Types: Cigarettes    Last attempt to quit: 07/16/2019    Years since quitting: 1.4   Smokeless tobacco: Never   Tobacco comments:    per pt he has one cigarette every couple days  Vaping Use   Vaping Use: Never used  Substance and Sexual Activity   Alcohol use: No    Alcohol/week: 0.0 standard drinks   Drug use: No   Sexual activity: Never    Partners: Female  Other Topics Concern   Not on file  Social History Narrative   Originally from [place]. Came to the [place] in 2009, following their son who came here for school.   Married.   Lives with his wife.   Their adult son lives in [place], [place], where he is in optometry school.   Education: [employer]   Exercise: No   Social Determinants of Radio broadcast assistant Strain: Not on file  Food Insecurity: Not on file  Transportation Needs: Not on file  Physical Activity: Not on file   Stress: Not on file  Social Connections: Not on file  Intimate Partner Violence: Not on file    REVIEW OF SYSTEMS: Constitutional: No profound weakness or fatigue. ENT:  No nose bleeds Pulm: Denies dyspnea on exertion.  No cough. CV:  No palpitations, no angina.  Some lower extremity swelling is not severe. GU:  No hematuria, no frequency GI: See HPI. Heme: Denies excessive or unusual bleeding or bruising. Transfusions: See HPI. Neuro:  No headaches, no visual disturbances.  Tingling in his legs. Derm:  No itching, no rash or sores.  Endocrine:  No sweats or chills.  No polyuria or dysuria Immunization: Vaccine history reviewed.  He is received Pfizer COVID-19 vaccination in early 2021, booster not documented and did not ask him about COVID vaccinations. Travel:  None beyond local counties in last few months.    PHYSICAL EXAM: Vital signs in last 24 hours: Vitals:   12/24/20 0217 12/24/20 0444  BP: (!) 199/90 (!) 191/100  Pulse: 96 100  Resp: 18 17  Temp: 98.3 F (36.8 C) 98.1 F (36.7 C)  SpO2: 90% 91%   Wt Readings from Last 3 Encounters:  12/23/20 61 kg  12/22/20 62 kg  06/19/20 62.1 kg    General: Pleasant, well-appearing, elderly, alert, comfortable. Head: No facial asymmetry or swelling.  No signs of head trauma. Eyes: Conjunctiva is pink.  No scleral icterus.  EOMI.  Arcus senilis. Ears: Not hard of hearing Nose: No congestion or discharge. Mouth: Moist, pink, clear oropharynx.  Poor dentition.  Tongue midline. Neck: No JVD, no masses, no thyromegaly. Lungs: Clear bilaterally.  No labored breathing.  No cough. Heart: NSR,  regular rate.  No MRG.  S1, S2 audible. Abdomen: Nonobese.  No masses, HSM, bruits, hernias.  No tenderness.  Bowel sounds active..   Rectal: A little bit tender at the rectum which has normal to increased tone.  Scant amount of stool, too small to really say what color it is, tests FOBT positive. Musc/Skeltl: No significant joint  distortion, swelling Extremities: No CCE. Neurologic: Alert.  Appropriate.  Oriented x3.  Moves all 4 limbs without tremor or gross weakness.  Able to transfer without any assistance from the bedside chair onto the bed and walk to the bathroom. Skin: No telangiectasia, no suspicious lesions, no rashes, no significant bruising or purpura. Nodes: No cervical adenopathy Psych: Cooperative, calm, pleasant.  Intake/Output from previous day: 06/14 0701 - 06/15 0700 In: 761.5 [P.O.:360; Blood:401.5] Out: 1200 [Urine:1200] Intake/Output this shift: No intake/output data recorded.  LAB RESULTS: Recent Labs    12/23/20 1524 12/23/20 2018 12/24/20 0729  WBC 10.8* 12.4* 19.2*  HGB 6.4* 6.3* 11.0*  HCT 21.0* 20.5* 32.7*  PLT 400 384 364   BMET Lab Results  Component Value Date   NA 140 12/24/2020   NA 136 12/23/2020   NA 139 12/22/2020   K 4.5 12/24/2020   K 4.4 12/23/2020   K 4.6 12/22/2020   CL 107 12/24/2020   CL 105 12/23/2020   CL 106 12/22/2020   CO2 18 (L) 12/24/2020   CO2 21 (L) 12/23/2020   CO2 20 12/22/2020   GLUCOSE 191 (H) 12/24/2020   GLUCOSE 218 (H) 12/23/2020   GLUCOSE 90 12/22/2020   BUN 49 (H) 12/24/2020   BUN 49 (H) 12/23/2020   BUN 58 (H) 12/22/2020   CREATININE 3.74 (H) 12/24/2020   CREATININE 3.72 (H) 12/23/2020   CREATININE 3.76 (H) 12/22/2020   CALCIUM 8.7 (L) 12/24/2020   CALCIUM 8.6 (L) 12/23/2020   CALCIUM 8.6 12/22/2020   LFT Recent Labs    12/22/20 1524 12/23/20 1524  PROT 6.8 7.0  ALBUMIN 4.1 3.4*  AST 15 16  ALT 19 21  ALKPHOS 99 87  BILITOT 0.4 0.5   PT/INR Lab Results  Component Value Date   INR 1.1 12/23/2020   INR 1.02 01/29/2016   Hepatitis Panel No results for input(s): HEPBSAG, HCVAB, HEPAIGM, HEPBIGM in the last 72 hours. C-Diff No components found for: CDIFF Lipase  No results found for: LIPASE  Drugs of Abuse  No results found for: LABOPIA, COCAINSCRNUR, LABBENZ, AMPHETMU, THCU, LABBARB   RADIOLOGY  STUDIES: No results found.   IMPRESSION:      Acute on chronic anemia with previously confirmed iron deficiency.  Surprisingly not that symptomatic.  Has had adenomatous colon polyps, diverticulosis and hemorrhoids on 2 previous colonoscopies.  Latest colonoscopy May 2021.  At May 2021 EGD had intestinal metaplasia at distal esophagus/Z-line and mild chronic gastritis, peptic duodenitis.  Compliant with daily Protonix.   Under renal Dr. Serita Grit care is receiving regular, once to twice monthly parenteral iron but not clear he is getting any CSA agents administered.  I suspect the anemia is multifactorial including blood loss as well as advanced kidney disease.  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        good response to 2 PRBCs.      FOBT positive stool.  This is not a aggressive GI bleed in that he has not had a bowel movement for 3 or 4 days but does report previous/recent darker than usual stools.  Late stage IV  CKD.     Chronic Eliquis for history of A. fib.  Not currently in A. fib.  Last Eliquis was in the afternoon/evening of 6/13.     NIDDM    Hx diastolic CHF, currently well compensated.    PLAN:         Pt d/w Dr Jerilynn Mages.   Plan is for SBE and placement of video capsule tomorrow.  He can eat solid food today but clear liquids for dinner tonight and a mild laxative regimen to get him ready for the capsule study.  Recheck CBC  Unnecessary to obtain hemoglobins every 4 hours.  Reconfigured orders for CBC q. 5 a/5 PM.    Resume oral PPI, IV not necessary   Azucena Freed  12/24/2020, 9:43 AM Phone [phone]

## 2020-12-24 NOTE — Hospital Course (Addendum)
Per H&P by Dr. Rogers Blocker: "Adam Reid is a 75 y.o. male with medical history significant of HTN, DM2, CKD stage 4, paroxysmal afib on eliquis, non obstructive CAD, diastolic HF, ACD, BPH and depression who presented to ER after PCP called and told him his hgb was low and to come to ER. He was seen yesterday at PCP for regular follow up, but since last week he has had increased fatigue, legs are tingling and mad mood. He denies any shortness of breath at rest. His wife reports black stool since last week.  No other obvious bruising or bleeding. No chest pain, palpitations, headaches, vision changes. No cough, no N/V/D. No abdominal pain. He is eating and drinking normal. Normal urination.    Last colonoscopy: last year  EGD: last year. No family of colon cancer. No NSAIDs/steroid use. No history of GI bleed. [facility] in 2021 with anemia and had upper and lower endoscopy w/o bleeding source.   ED Course: vitals: BP 136/70, HR: 77, RR: 18, afebrile and oxygen 100% on RA. Hgb found to be 6.3 PCP office. Hgb today is 6.4.  Fecal occult + here. ER physician has ordered 2 units of blood, given IV protonix and consulted GI. They will plan on seeing him."  Admitted to [facility] service with Gastroenterology consulted for further evaluation of acute GI bleeding.

## 2020-12-25 ENCOUNTER — Encounter (HOSPITAL_COMMUNITY)
Admission: EM | Disposition: A | Discharge status: Home / Self Care | Payer: Self-pay | Source: Home / Self Care | Attending: Internal Medicine

## 2020-12-25 ENCOUNTER — Inpatient Hospital Stay (HOSPITAL_COMMUNITY): Payer: Medicare Other | Admitting: Anesthesiology

## 2020-12-25 ENCOUNTER — Encounter (HOSPITAL_COMMUNITY): Payer: Self-pay | Admitting: Family Medicine

## 2020-12-25 ENCOUNTER — Encounter: Payer: Self-pay | Admitting: Gastroenterology

## 2020-12-25 DIAGNOSIS — K2289 Other specified disease of esophagus: Secondary | ICD-10-CM

## 2020-12-25 DIAGNOSIS — D649 Anemia, unspecified: Secondary | ICD-10-CM | POA: Diagnosis not present

## 2020-12-25 DIAGNOSIS — K922 Gastrointestinal hemorrhage, unspecified: Principal | ICD-10-CM

## 2020-12-25 HISTORY — PX: BIOPSY: SHX5522

## 2020-12-25 HISTORY — PX: SUBMUCOSAL TATTOO INJECTION: SHX6856

## 2020-12-25 HISTORY — PX: GIVENS CAPSULE STUDY: SHX5432

## 2020-12-25 HISTORY — PX: ENTEROSCOPY: SHX5533

## 2020-12-25 LAB — BPAM RBC
Blood Product Expiration Date: 202207042359
Blood Product Expiration Date: 202207042359
ISSUE DATE / TIME: 202206142205
ISSUE DATE / TIME: 202206150155
Unit Type and Rh: 6200
Unit Type and Rh: 6200

## 2020-12-25 LAB — TYPE AND SCREEN
ABO/RH(D): A POS
Antibody Screen: NEGATIVE
Unit division: 0
Unit division: 0

## 2020-12-25 LAB — BASIC METABOLIC PANEL
Anion gap: 12 (ref 5–15)
BUN: 47 mg/dL — ABNORMAL HIGH (ref 8–23)
CO2: 19 mmol/L — ABNORMAL LOW (ref 22–32)
Calcium: 8.1 mg/dL — ABNORMAL LOW (ref 8.9–10.3)
Chloride: 103 mmol/L (ref 98–111)
Creatinine, Ser: 3.36 mg/dL — ABNORMAL HIGH (ref 0.61–1.24)
GFR, Estimated: 19 mL/min — ABNORMAL LOW (ref >60)
Glucose, Bld: 160 mg/dL — ABNORMAL HIGH (ref 70–99)
Potassium: 3.6 mmol/L (ref 3.5–5.1)
Sodium: 134 mmol/L — ABNORMAL LOW (ref 135–145)

## 2020-12-25 LAB — GLUCOSE, CAPILLARY
Glucose-Capillary: 165 mg/dL — ABNORMAL HIGH (ref 70–99)
Glucose-Capillary: 138 mg/dL — ABNORMAL HIGH (ref 70–99)
Glucose-Capillary: 125 mg/dL — ABNORMAL HIGH (ref 70–99)
Glucose-Capillary: 183 mg/dL — ABNORMAL HIGH (ref 70–99)
Glucose-Capillary: 129 mg/dL — ABNORMAL HIGH (ref 70–99)

## 2020-12-25 LAB — CBC
HCT: 28.3 % — ABNORMAL LOW (ref 39.0–52.0)
HCT: 30.3 % — ABNORMAL LOW (ref 39.0–52.0)
Hemoglobin: 9.6 g/dL — ABNORMAL LOW (ref 13.0–17.0)
Hemoglobin: 9.9 g/dL — ABNORMAL LOW (ref 13.0–17.0)
MCH: 31.6 pg (ref 26.0–34.0)
MCH: 30.6 pg (ref 26.0–34.0)
MCHC: 33.9 g/dL (ref 30.0–36.0)
MCHC: 32.7 g/dL (ref 30.0–36.0)
MCV: 93.1 fL (ref 80.0–100.0)
MCV: 93.5 fL (ref 80.0–100.0)
Platelets: 311 10*3/uL (ref 150–400)
Platelets: 328 10*3/uL (ref 150–400)
RBC: 3.04 MIL/uL — ABNORMAL LOW (ref 4.22–5.81)
RBC: 3.24 MIL/uL — ABNORMAL LOW (ref 4.22–5.81)
RDW: 18.2 % — ABNORMAL HIGH (ref 11.5–15.5)
RDW: 18 % — ABNORMAL HIGH (ref 11.5–15.5)
WBC: 12.9 10*3/uL — ABNORMAL HIGH (ref 4.0–10.5)
WBC: 10.7 10*3/uL — ABNORMAL HIGH (ref 4.0–10.5)
nRBC: 0 % (ref 0.0–0.2)
nRBC: 0 % (ref 0.0–0.2)

## 2020-12-25 SURGERY — ENTEROSCOPY
Anesthesia: Monitor Anesthesia Care

## 2020-12-25 MED ORDER — SPOT INK MARKER SYRINGE KIT
PACK | SUBMUCOSAL | Status: DC | PRN
  Administered 2020-12-25: 2 mL via SUBMUCOSAL

## 2020-12-25 MED ORDER — PROPOFOL 500 MG/50ML IV EMUL
INTRAVENOUS | Status: DC | PRN
  Administered 2020-12-25 (×2): 20 mg via INTRAVENOUS
  Administered 2020-12-25: 100 ug/kg/min via INTRAVENOUS
  Administered 2020-12-25: 20 mg via INTRAVENOUS

## 2020-12-25 MED ORDER — SODIUM CHLORIDE 0.9 % IV SOLN: INTRAVENOUS | Status: DC

## 2020-12-25 MED ORDER — SODIUM CHLORIDE 0.9 % IV SOLN
250.0000 mg | Freq: Once | INTRAVENOUS | Status: AC
  Administered 2020-12-25: 250 mg via INTRAVENOUS
  Filled 2020-12-25: qty 20

## 2020-12-25 MED ORDER — SPOT INK MARKER SYRINGE KIT
PACK | SUBMUCOSAL | Status: AC
  Filled 2020-12-25: qty 5

## 2020-12-25 MED ORDER — DILTIAZEM HCL ER COATED BEADS 180 MG PO CP24
180.0000 mg | ORAL_CAPSULE | Freq: Every day | ORAL | Status: DC
  Administered 2020-12-26 – 2020-12-27 (×2): 180 mg via ORAL
  Filled 2020-12-25 (×2): qty 1

## 2020-12-25 MED ORDER — DILTIAZEM HCL ER 60 MG PO CP12
60.0000 mg | ORAL_CAPSULE | Freq: Once | ORAL | Status: AC
  Administered 2020-12-25: 60 mg via ORAL
  Filled 2020-12-25: qty 1

## 2020-12-25 NOTE — Progress Notes (Signed)
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 +Occult stool.  Iron 38, ferritin 581. 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 Rush Landmark --GI consulted - see their recs --Trend Hbg, transfuse if < 7.0 --Telemetry monitoring --GI plans for small bowel enteroscopy, possible video capsule --Oral PPI, no need for IV per GI --IV iron infusion today --Follow up biopsy of distal esophagus (?Barrett's)  Hypertension -chronic, stable.  Uncontrolled on home diltiazem 120 mg PO daily.  Increase to 180 mg  Chronic diastolic CHF -Echo 08/3534: EF 55 to 14%, grade 3 diastolic dysfunction. -- Hold Farxiga in setting of worsening renal function and significant anemia --Appears he used to follow with heart failure clinic but not but in a while Clinically appears euvolemic and compensated  Paroxysmal A. Fib -rate controlled, in normal sinus rhythm.  Continue amiodarone.  Hold Eliquis in setting of above.  Telemetry monitoring.  CKD stage IV -diabetic nephropathy.  Monitor renal function.  Avoid nephrotoxins and renally dose meds when indicated  Type 2 diabetes with renal manifestations -A1c 6.0.  Well controlled.  Hold  glipizide and Farxiga.  Continue Januvia.  Sliding scale NovoLog with Accu-Cheks.  Hypoglycemia protocol.  Hyperlipidemia /nonobstructive CAD -continue statin .  LDL is at goal at 23  BPH - cont home med  Current smoker - nicotine patch  Depression - recently stopped Celexa on his own 2-3 weeks ago.  PCP resumed at visit day before admission.  Continue Celexa.  Patient BMI: Body mass index is 21.73 kg/m.   DVT prophylaxis: SCDs Start: 12/23/20 1850   Diet:  Diet Orders (From admission, onward)     Start     Ordered   12/25/20 1128  Diet Carb Modified Fluid consistency: Thin; Room service appropriate? Yes  Diet effective now       Question Answer Comment  Diet-HS Snack? Nothing   Calorie Level Medium 1600-2000   Fluid consistency: Thin   Room service appropriate? Yes      12/25/20 1127              Code Status: Full Code   Brief Narrative / Hospital Course to Date:   Per H&P by Dr. Rogers Blocker: "Adam Reid is a 75 y.o. male with medical history significant of HTN, DM2, CKD stage 4, paroxysmal afib on eliquis, non obstructive CAD, diastolic HF, ACD, BPH and depression who presented to ER after PCP called and told him his hgb was low and to come to ER. He was seen yesterday at PCP for regular follow up, but since last week he has had increased fatigue, legs are tingling and mad mood. He denies any shortness  of breath at rest. His wife reports black stool since last week.  No other obvious bruising or bleeding. No chest pain, palpitations, headaches, vision changes. No cough, no N/V/D. No abdominal pain. He is eating and drinking normal. Normal urination.    Last colonoscopy: last year  EGD: last year. No family of colon cancer. No NSAIDs/steroid use. No history of GI bleed.  GI in 2021 with anemia and had upper and lower endoscopy w/o bleeding source.   ED Course: vitals: BP 136/70, HR: 77, RR: 18, afebrile and oxygen 100% on RA. Hgb found to be 6.3 PCP office. Hgb  today is 6.4.  Fecal occult + here. ER physician has ordered 2 units of blood, given IV protonix and consulted GI. They will plan on seeing him."  Admitted to [facility] service with Gastroenterology consulted for further evaluation of acute GI bleeding.    Subjective 12/25/20    Pt seen with assistance of video interpreter Eduard Clos, ID# [phone]).  Seen after SBE procedure this AM.  He reports feeling well, is quite hungry awaiting lunch.  No acute complaints.     Disposition Plan & Communication   Status is: Inpatient  Remains inpatient appropriate because:Ongoing active pain requiring inpatient pain management  Dispo: The patient is from: Home              Anticipated d/c is to: Home              Patient currently is not medically stable to d/c.   Difficult to place patient No   Consults, Procedures, Significant Events   Consultants:  Gastroenterology  Procedures:  Small bowel enteroscopy 12/25/20: Impression: - - Submucosal nodule found in the esophagus. - No gross lesions in mid-esophagus. - Salmon-colored mucosa island distally. Biopsied. - Z-line irregular, 40 cm from the incisors. - 3 cm hiatal hernia. - No gross lesions in the stomach. - Normal mucosa was found in the entire examined duodenum. - Normal mucosa was found in the visualized proximal jejunum. Tattooed distal extent. - Successful completion of the Video Capsule Enteroscope placement.  Antimicrobials:  Anti-infectives (From admission, onward)    None         Micro    Objective   Vitals:   12/25/20 0938 12/25/20 0948 12/25/20 1042 12/25/20 1756  BP: (!) 152/78 (!) 161/73 (!) 153/83 135/71  Pulse: 76 78 77 77  Resp: 20 18 19 18   Temp:   98.5 F (36.9 C) 98.6 F (37 C)  TempSrc:   Oral Oral  SpO2: 97% 94% 97% 98%  Weight:      Height:        Intake/Output Summary (Last 24 hours) at 12/25/2020 1809 Last data filed at 12/25/2020 1709 Gross per 24 hour  Intake 1840 ml  Output --  Net 1840 ml    Filed Weights   12/23/20 2017 12/25/20 0438  Weight: 61 kg 61.1 kg    Physical Exam:  General exam: awake, alert, no acute distress Respiratory system: normal respiratory effort, on room air. Cardiovascular system: RRR, no pedal edema.   Gastrointestinal system: soft, non-distended. Central nervous system: A&O x3. no gross focal neurologic deficits, normal speech Extremities: moves all, no edema, normal tone Psychiatry: normal mood, congruent affect, judgement and insight appear normal  Labs   Data Reviewed: I have personally reviewed following labs and imaging studies  CBC: Recent Labs  Lab 12/23/20 2018 12/24/20 0729 12/24/20 1659 12/25/20 0510 12/25/20 1650  WBC 12.4* 19.2* 17.3* 12.9* 10.7*  NEUTROABS  --  17.9*  --   --   --   HGB 6.3* 11.0* 10.0* 9.6* 9.9*  HCT 20.5* 32.7* 29.9* 28.3* 30.3*  MCV 95.8 92.9 91.7 93.1 93.5  PLT 384 364 352 311 161   Basic Metabolic Panel: Recent Labs  Lab 12/22/20 1524 12/23/20 1524 12/24/20 0729 12/25/20 0510  NA 139 136 140 134*  K 4.6 4.4 4.5 3.6  CL 106 105 107 103  CO2 20 21* 18* 19*  GLUCOSE 90 218* 191* 160*  BUN 58* 49* 49* 47*  CREATININE 3.76* 3.72* 3.74* 3.36*  CALCIUM 8.6 8.6* 8.7* 8.1*   GFR: Estimated Creatinine Clearance: 16.9 mL/min (A) (by C-G formula based on SCr of 3.36 mg/dL (H)). Liver Function Tests: Recent Labs  Lab 12/22/20 1524 12/23/20 1524  AST 15 16  ALT 19 21  ALKPHOS 99 87  BILITOT 0.4 0.5  PROT 6.8 7.0  ALBUMIN 4.1 3.4*   No results for input(s): LIPASE, AMYLASE in the last 168 hours. No results for input(s): AMMONIA in the last 168 hours. Coagulation Profile: Recent Labs  Lab 12/23/20 1547  INR 1.1   Cardiac Enzymes: No results for input(s): CKTOTAL, CKMB, CKMBINDEX, TROPONINI in the last 168 hours. BNP (last 3 results) No results for input(s): PROBNP in the last 8760 hours. HbA1C: No results for input(s): HGBA1C in the last 72 hours.  CBG: Recent Labs  Lab  12/24/20 2110 12/25/20 0607 12/25/20 0816 12/25/20 1124 12/25/20 1612  GLUCAP 79 165* 138* 125* 183*   Lipid Profile: No results for input(s): CHOL, HDL, LDLCALC, TRIG, CHOLHDL, LDLDIRECT in the last 72 hours.  Thyroid Function Tests: No results for input(s): TSH, T4TOTAL, FREET4, T3FREE, THYROIDAB in the last 72 hours.  Anemia Panel: Recent Labs    12/23/20 1653  VITAMINB12 982*  FOLATE 16.9  FERRITIN 581*  TIBC 274  IRON 38*  RETICCTPCT 4.8*   Sepsis Labs: No results for input(s): PROCALCITON, LATICACIDVEN in the last 168 hours.  Recent Results (from the past 240 hour(s))  Resp Panel by RT-PCR (Flu A&B, Covid) Nasopharyngeal Swab     Status: None   Collection Time: 12/23/20  5:29 PM   Specimen: Nasopharyngeal Swab; Nasopharyngeal(NP) swabs in vial transport medium  Result Value Ref Range Status   SARS Coronavirus 2 by RT PCR NEGATIVE NEGATIVE Final    Comment: (NOTE) SARS-CoV-2 target nucleic acids are NOT DETECTED.  The SARS-CoV-2 RNA is generally detectable in upper respiratory specimens during the acute phase of infection. The lowest concentration of SARS-CoV-2 viral copies this assay can detect is 138 copies/mL. A negative result does not preclude SARS-Cov-2 infection and should not be used as the sole basis for treatment or other patient management decisions. A negative result may occur with  improper specimen collection/handling, submission of specimen other than nasopharyngeal swab, presence of viral mutation(s) within the areas targeted by this assay, and inadequate number of viral copies(<138 copies/mL). A negative result must be combined with clinical observations, patient history, and epidemiological information. The expected result is Negative.  Fact Sheet for Patients:  EntrepreneurPulse.com.au  Fact Sheet for Healthcare Providers:  IncredibleEmployment.be  This test is no t yet approved or cleared by the [place] FDA and  has been authorized for detection and/or diagnosis of SARS-CoV-2 by FDA under an Emergency Use Authorization (EUA). This EUA will remain  in effect (meaning this test can be used) for the duration of the COVID-19 declaration under Section 564(b)(1) of the Act, 21 U.S.C.section  360bbb-3(b)(1), unless the authorization is terminated  or revoked sooner.       Influenza A by PCR NEGATIVE NEGATIVE Final   Influenza B by PCR NEGATIVE NEGATIVE Final    Comment: (NOTE) The Xpert Xpress SARS-CoV-2/FLU/RSV plus assay is intended as an aid in the diagnosis of influenza from Nasopharyngeal swab specimens and should not be used as a sole basis for treatment. Nasal washings and aspirates are unacceptable for Xpert Xpress SARS-CoV-2/FLU/RSV testing.  Fact Sheet for Patients: EntrepreneurPulse.com.au  Fact Sheet for Healthcare Providers: IncredibleEmployment.be  This test is not yet approved or cleared by the [place] FDA and has been authorized for detection and/or diagnosis of SARS-CoV-2 by FDA under an Emergency Use Authorization (EUA). This EUA will remain in effect (meaning this test can be used) for the duration of the COVID-19 declaration under Section 564(b)(1) of the Act, 21 U.S.C. section 360bbb-3(b)(1), unless the authorization is terminated or revoked.  Performed at [facility], [place] [address] [place], [place] [address]       Imaging Studies   No results found.   Medications   Scheduled Meds:  amiodarone  200 mg Oral Daily   atorvastatin  80 mg Oral QHS   citalopram  20 mg Oral Daily   [START ON 12/26/2020] diltiazem  180 mg Oral Daily   insulin aspart  0-9 Units Subcutaneous TID WC   linagliptin  5 mg Oral Daily   nicotine  7 mg Transdermal Daily   sodium chloride flush  3 mL Intravenous Q12H   tamsulosin  0.4 mg Oral QPC supper   Continuous Infusions:  sodium chloride     sodium chloride      sodium chloride         LOS: 2 days    Time spent: 25 minutes    Ezekiel Slocumb, DO [facility]  12/25/2020, 6:09 PM      If 7PM-7AM, please contact night-coverage. How to contact the [facility] Attending or Consulting provider [place] or covering provider during after hours [place], for this patient?    Check the care team in [facility] and look for a) attending/consulting [facility] provider listed and b) the [facility] team listed Log into [email or web address] and use [place]'s universal password to access. If you do not have the password, please contact the hospital operator. Locate the [facility] provider you are looking for under [facility] and page to a number that you can be directly reached. If you still have difficulty reaching the provider, please page the [facility] (Director on Call) for the Hospitalists listed on amion for assistance.

## 2020-12-25 NOTE — Op Note (Signed)
[facility] Patient Name: Adam Reid Procedure Date : 12/25/2020 MRN: [id number] Attending MD: Justice Britain , MD Date of Birth: [birth year] CSN: [id number] Age: 75 Admit Type: Inpatient Procedure:                Small bowel enteroscopy Indications:              Anemia, Iron deficiency anemia, GI bleeding source                            not documented by previous EGD and colonoscopy,                            Occult blood in stool Providers:                Justice Britain, MD, Baird Cancer, RN, Lesia Sago, Technician Referring MD:             Mauri Pole, MD, [facility] Medicines:                Monitored Anesthesia Care Complications:            No immediate complications. Estimated Blood Loss:     Estimated blood loss: none. Procedure:                Pre-Anesthesia Assessment:                           - Prior to the procedure, a History and Physical                            was performed, and patient medications and                            allergies were reviewed. The patient's tolerance of                            previous anesthesia was also reviewed. The risks                            and benefits of the procedure and the sedation                            options and risks were discussed with the patient.                            All questions were answered, and informed consent                            was obtained. Prior Anticoagulants: The patient has                            taken Eliquis (apixaban), last dose was 4 days  prior to procedure. ASA Grade Assessment: III - A                            patient with severe systemic disease. After                            reviewing the risks and benefits, the patient was                            deemed in satisfactory condition to undergo the                            procedure.                            After obtaining informed consent, the endoscope was                            passed under direct vision. Throughout the                            procedure, the patient's blood pressure, pulse, and                            oxygen saturations were monitored continuously. The                            [id number]-[id number] ([id number]) Olympus pediatric colonscope                            was introduced through the mouth and advanced to                            the proximal jejunum. The [id number]-H190 ([id number])                            Olympus gastroscope was introduced through the                            mouth and advanced to the duodenal bulb. The small                            bowel enteroscopy was accomplished without                            difficulty. The patient tolerated the procedure. Scope In: Scope Out: Findings:      A single small submucosal nodule was found in the proximal esophagus, 26       cm from the incisors.      No gross lesions were noted in the mid esophagus.      Scattered islands of salmon-colored mucosa were present at 39 cm. No       other visible abnormalities were present. Biopsies were taken with a       cold forceps for histology.  The Z-line was irregular and was found 40 cm from the incisors.      A 3 cm hiatal hernia was present.      No gross lesions were noted in the stomach.      Normal mucosa was found in the entire duodenum.      Normal mucosa was found in the proximal jejunum. Area was tattooed with       an injection of Spot (carbon black) to demarcate the distal extent of       today's procedure. As I was pulling back evaluating the small bowel,       there were some small areas of scope passage trauma, but no overt AVMs       or other lesions noted in the entire visualized area (we evaluated the       small bowel twice to the tattoo).      We then exchanged to the EGD scope, and the video capsule enteroscope       was advanced into the  duodenal bulb. Impression:               - Submucosal nodule found in the esophagus.                           - No gross lesions in mid-esophagus.                           - Salmon-colored mucosa island distally. Biopsied.                           - Z-line irregular, 40 cm from the incisors.                           - 3 cm hiatal hernia.                           - No gross lesions in the stomach.                           - Normal mucosa was found in the entire examined                            duodenum.                           - Normal mucosa was found in the visualized                            proximal jejunum. Tattooed distal extent.                           - Successful completion of the Video Capsule                            Enteroscope placement. Recommendation:           - The patient will be observed post-procedure,  until all discharge criteria are met.                           - Return patient to hospital ward for ongoing care.                           - VCE Diet protocol.                           - Await pathology results.                           - Trend Hgb/Hct.                           - Continue PO PPI at this time.                           - Will pick up VCE on Friday to evaluate and see if                            any additional inpatient evaluation will be                            required.                           - IV Iron while inhouse should be pursued.                           - The findings and recommendations were discussed                            with the patient.                           - The findings and recommendations were discussed                            with the referring physician. Procedure Code(s):        --- Professional ---                           [phone], Small intestinal endoscopy, enteroscopy                            beyond second portion of duodenum, not including                             ileum; with biopsy, single or multiple                           44799, Unlisted procedure, small intestine Diagnosis Code(s):        --- Professional ---  K22.8, Other specified diseases of esophagus                           K44.9, Diaphragmatic hernia without obstruction or                            gangrene                           D64.9, Anemia, unspecified                           D50.9, Iron deficiency anemia, unspecified                           K92.2, Gastrointestinal hemorrhage, unspecified                           R19.5, Other fecal abnormalities CPT copyright 2019 American Medical Association. All rights reserved. The codes documented in this report are preliminary and upon coder review may  be revised to meet current compliance requirements. Justice Britain, MD 12/25/2020 9:45:13 AM Number of Addenda: 0

## 2020-12-25 NOTE — Progress Notes (Signed)
Pt reports feeling hot. He states this is how he usually feels when his blood sugar is low. Checked his last cbg reading which was 183 @ 1612. Pt reports he didn't eat his dinner tray when it arrived earlier. Order was placed for tray and is now at bedside. Pt states he will eat his food first and see if that helps. Advised pt to inform me if hot feeling continues or if he feels any worse. Pt's wife at bedside to translate. Will continue to monitor.

## 2020-12-25 NOTE — Transfer of Care (Signed)
Immediate Anesthesia Transfer of Care Note  Patient: Haematologist  Procedure(s) Performed: ENTEROSCOPY SUBMUCOSAL TATTOO INJECTION BIOPSY GIVENS CAPSULE STUDY  Patient Location: PACU  Anesthesia Type:MAC  Level of Consciousness: drowsy  Airway & Oxygen Therapy: Patient Spontanous Breathing and Patient connected to nasal cannula oxygen  Post-op Assessment: Report given to RN and Post -op Vital signs reviewed and stable  Post vital signs: Reviewed and stable  Last Vitals:  Vitals Value Taken Time  BP 149/69 12/25/20 0928  Temp    Pulse 73 12/25/20 0928  Resp 16 12/25/20 0928  SpO2 100 % 12/25/20 0928  Vitals shown include unvalidated device data.  Last Pain:  Vitals:   12/25/20 0800  TempSrc: Oral  PainSc: 0-No pain         Complications: No notable events documented.

## 2020-12-25 NOTE — Anesthesia Postprocedure Evaluation (Signed)
Anesthesia Post Note  Patient: Haematologist  Procedure(s) Performed: ENTEROSCOPY SUBMUCOSAL TATTOO INJECTION BIOPSY GIVENS CAPSULE STUDY     Patient location during evaluation: Endoscopy Anesthesia Type: MAC Level of consciousness: awake and alert Pain management: pain level controlled Vital Signs Assessment: post-procedure vital signs reviewed and stable Respiratory status: spontaneous breathing, nonlabored ventilation, respiratory function stable and patient connected to nasal cannula oxygen Cardiovascular status: blood pressure returned to baseline and stable Postop Assessment: no apparent nausea or vomiting Anesthetic complications: no   No notable events documented.  Last Vitals:  Vitals:   12/25/20 0938 12/25/20 0948  BP: (!) 152/78 (!) 161/73  Pulse: 76 78  Resp: 20 18  Temp:    SpO2: 97% 94%    Last Pain:  Vitals:   12/25/20 0948  TempSrc:   PainSc: 0-No pain                 Cyndel Griffey L Bailyn Spackman

## 2020-12-25 NOTE — Anesthesia Preprocedure Evaluation (Addendum)
Anesthesia Evaluation  Patient identified by MRN, date of birth, ID band Patient awake    Reviewed: Allergy & Precautions, NPO status , Patient's Chart, lab work & pertinent test results  Airway Mallampati: III  TM Distance: >3 FB Neck ROM: Full    Dental  (+) Teeth Intact, Dental Advisory Given   Pulmonary neg pulmonary ROS, Current Smoker and Patient abstained from smoking.,    Pulmonary exam normal breath sounds clear to auscultation       Cardiovascular hypertension, Pt. on medications +CHF  Normal cardiovascular exam+ dysrhythmias Atrial Fibrillation  Rhythm:Regular Rate:Normal  TTE 2021 1. Left ventricular ejection fraction, by visual estimation, is 55 to 60%. The left ventricle has normal function. There is mildly increased left ventricular hypertrophy.  2. Elevated left atrial pressure.  3. Left ventricular diastolic parameters are consistent with Grade III diastolic dysfunction (restrictive).  4. The left ventricle has no regional wall motion abnormalities.  5. Global right ventricle has normal systolic function.The right ventricular size is normal.  6. Left atrial size was moderately dilated.  7. Right atrial size was normal.  8. Trivial pericardial effusion is present.  9. The mitral valve is normal in structure. Mild mitral valve  regurgitation. No evidence of mitral stenosis.  10. The tricuspid valve is normal in structure. Tricuspid valve  regurgitation is mild.  11. The aortic valve is tricuspid. Aortic valve regurgitation is not visualized. No evidence of aortic valve sclerosis or stenosis.  12. The pulmonic valve was normal in structure. Pulmonic valve regurgitation is trivial.  13. Mildly elevated pulmonary artery systolic pressure.  14. The inferior vena cava is normal in size with greater than 50% respiratory variability, suggesting right atrial pressure of 3 mmHg.  15. Normal LV systolic function; mild  LVH; restrictive filling; moderate LAE; mild MR.   LHC 2017 Ramus lesion, 20% stenosed. Ost 2nd Diag to 2nd Diag lesion, 50% stenosed. Mid LAD lesion, 20% stenosed. Normal LVEDP     Neuro/Psych PSYCHIATRIC DISORDERS Depression negative neurological ROS     GI/Hepatic Neg liver ROS, GERD  Medicated and Controlled,  Endo/Other  diabetes, Type 2, Oral Hypoglycemic AgentsHypothyroidism   Renal/GU Renal InsufficiencyRenal disease (Cr 3.36, K 3.6)  negative genitourinary   Musculoskeletal negative musculoskeletal ROS (+)   Abdominal   Peds  Hematology  (+) Blood dyscrasia (on eliquis, Hgb 9.6), anemia ,   Anesthesia Other Findings medical history significant of HTN, DM2, CKD stage 4, paroxysmal afib on eliquis, non obstructive CAD, diastolic HF, ACD, BPH and depression who presented to ER after PCP called and told him his hgb was low and to come to ER  Reproductive/Obstetrics                          Anesthesia Physical Anesthesia Plan  ASA: 3  Anesthesia Plan: MAC   Post-op Pain Management:    Induction: Intravenous  PONV Risk Score and Plan: 1 and Propofol infusion and Treatment may vary due to age or medical condition  Airway Management Planned: Natural Airway  Additional Equipment:   Intra-op Plan:   Post-operative Plan:   Informed Consent: I have reviewed the patients History and Physical, chart, labs and discussed the procedure including the risks, benefits and alternatives for the proposed anesthesia with the patient or authorized representative who has indicated his/her understanding and acceptance.     Dental advisory given  Plan Discussed with: CRNA  Anesthesia Plan Comments:  Anesthesia Quick Evaluation  

## 2020-12-25 NOTE — Interval H&P Note (Signed)
History and Physical Interval Note:  12/25/2020 8:28 AM  Adam Reid  has presented today for surgery, with the diagnosis of Iron Deficiency Anemia/Dark Stools/Anticoagulation.  The various methods of treatment have been discussed with the patient and family. After consideration of risks, benefits and other options for treatment, the patient has consented to  Procedure(s): ENTEROSCOPY (N/A) GIVENS CAPSULE STUDY (N/A) as a surgical intervention.  The patient's history has been reviewed, patient examined, no change in status, stable for surgery.  I have reviewed the patient's chart and labs.  Questions were answered to the patient's satisfaction.     [employer]

## 2020-12-26 DIAGNOSIS — D649 Anemia, unspecified: Secondary | ICD-10-CM | POA: Diagnosis not present

## 2020-12-26 LAB — CBC
HCT: 28.4 % — ABNORMAL LOW (ref 39.0–52.0)
Hemoglobin: 9.4 g/dL — ABNORMAL LOW (ref 13.0–17.0)
MCH: 30.5 pg (ref 26.0–34.0)
MCHC: 33.1 g/dL (ref 30.0–36.0)
MCV: 92.2 fL (ref 80.0–100.0)
Platelets: 316 10*3/uL (ref 150–400)
RBC: 3.08 MIL/uL — ABNORMAL LOW (ref 4.22–5.81)
RDW: 17.8 % — ABNORMAL HIGH (ref 11.5–15.5)
WBC: 12.6 10*3/uL — ABNORMAL HIGH (ref 4.0–10.5)
nRBC: 0 % (ref 0.0–0.2)

## 2020-12-26 LAB — GLUCOSE, CAPILLARY
Glucose-Capillary: 144 mg/dL — ABNORMAL HIGH (ref 70–99)
Glucose-Capillary: 130 mg/dL — ABNORMAL HIGH (ref 70–99)
Glucose-Capillary: 131 mg/dL — ABNORMAL HIGH (ref 70–99)
Glucose-Capillary: 148 mg/dL — ABNORMAL HIGH (ref 70–99)

## 2020-12-26 LAB — BASIC METABOLIC PANEL
Anion gap: 10 (ref 5–15)
BUN: 47 mg/dL — ABNORMAL HIGH (ref 8–23)
CO2: 20 mmol/L — ABNORMAL LOW (ref 22–32)
Calcium: 8.3 mg/dL — ABNORMAL LOW (ref 8.9–10.3)
Chloride: 107 mmol/L (ref 98–111)
Creatinine, Ser: 3.51 mg/dL — ABNORMAL HIGH (ref 0.61–1.24)
GFR, Estimated: 18 mL/min — ABNORMAL LOW (ref >60)
Glucose, Bld: 143 mg/dL — ABNORMAL HIGH (ref 70–99)
Potassium: 3.6 mmol/L (ref 3.5–5.1)
Sodium: 137 mmol/L (ref 135–145)

## 2020-12-26 LAB — SURGICAL PATHOLOGY

## 2020-12-26 LAB — HEMOGLOBIN AND HEMATOCRIT, BLOOD
HCT: 30.1 % — ABNORMAL LOW (ref 39.0–52.0)
Hemoglobin: 9.7 g/dL — ABNORMAL LOW (ref 13.0–17.0)

## 2020-12-26 NOTE — Progress Notes (Signed)
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 +Occult stool.  Iron 38, ferritin 581. 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 Rush Landmark --GI consulted - see their recs --Video capsule study results pending --Trend Hbg, transfuse if < 7.0 --Repeat Hbg this afternoon given melena today --Telemetry monitoring --Oral PPI, no need for IV per GI --IV iron infusion given 6/16 --Follow up biopsy of distal esophagus (?Barrett's)  Hypertension -chronic, stable.  Uncontrolled on home diltiazem 120 mg PO daily.  Increased to 180 mg  Chronic diastolic CHF -Echo 01/8468: EF 55 to 62%, grade 3 diastolic dysfunction. -- Hold Farxiga in setting of worsening renal function and significant anemia --Appears he used to follow with heart failure clinic but not but in a while Clinically appears euvolemic and compensated  Paroxysmal A. Fib -rate controlled, in normal sinus rhythm.  Continue amiodarone.  Hold Eliquis in setting of above.  Telemetry monitoring.  CKD stage IV -diabetic nephropathy.  Monitor renal function.  Avoid nephrotoxins and renally dose meds when indicated  Type 2 diabetes with renal manifestations -A1c 6.0.   Well controlled.  Hold glipizide and Farxiga.  Continue Januvia.  Sliding scale NovoLog with Accu-Cheks.  Hypoglycemia protocol.  Hyperlipidemia /nonobstructive CAD -continue statin .  LDL is at goal at 32  BPH - cont home med  Current smoker - nicotine patch  Depression - recently stopped Celexa on his own 2-3 weeks ago.  PCP resumed at visit day before admission.  Continue Celexa.  Patient BMI: Body mass index is 21.55 kg/m.   DVT prophylaxis: SCDs Start: 12/23/20 1850   Diet:  Diet Orders (From admission, onward)     Start     Ordered   12/25/20 1128  Diet Carb Modified Fluid consistency: Thin; Room service appropriate? Yes  Diet effective now       Question Answer Comment  Diet-HS Snack? Nothing   Calorie Level Medium 1600-2000   Fluid consistency: Thin   Room service appropriate? Yes      12/25/20 1127              Code Status: Full Code   Brief Narrative / Hospital Course to Date:   Per H&P by Dr. Rogers Blocker: "Adam Reid is a 75 y.o. male with medical history significant of HTN, DM2, CKD stage 4, paroxysmal afib on eliquis, non obstructive CAD, diastolic HF, ACD, BPH and depression who presented to ER after PCP called and told him his hgb was low and to come to ER. He was seen yesterday at PCP for regular follow up, but since last week he has had increased fatigue, legs are tingling and mad mood.  He denies any shortness of breath at rest. His wife reports black stool since last week.  No other obvious bruising or bleeding. No chest pain, palpitations, headaches, vision changes. No cough, no N/V/D. No abdominal pain. He is eating and drinking normal. Normal urination.    Last colonoscopy: last year  EGD: last year. No family of colon cancer. No NSAIDs/steroid use. No history of GI bleed. [facility] in 2021 with anemia and had upper and lower endoscopy w/o bleeding source.   ED Course: vitals: BP 136/70, HR: 77, RR: 18, afebrile and oxygen 100% on RA. Hgb found to be  6.3 PCP office. Hgb today is 6.4.  Fecal occult + here. ER physician has ordered 2 units of blood, given IV protonix and consulted GI. They will plan on seeing him."  Admitted to [facility] service with Gastroenterology consulted for further evaluation of acute GI bleeding.    Subjective 12/26/20    Pt feeling okay today but has had a few episodes are very dark tarry stools again, started around 3 AM and more this morning.  Otherwise feels okay.  No other acute complaints including abdominal pain or N/V.   Disposition Plan & Communication   Status is: Inpatient  Remains inpatient appropriate because:Ongoing active pain requiring inpatient pain management  Dispo: The patient is from: Home              Anticipated d/c is to: Home              Patient currently is not medically stable to d/c.   Difficult to place patient No  Family communication - wife at bedside on rounds today 6/17  Consults, Procedures, Significant Events   Consultants:  Gastroenterology  Procedures:  Small bowel enteroscopy 12/25/20: Impression: - - Submucosal nodule found in the esophagus. - No gross lesions in mid-esophagus. - Salmon-colored mucosa island distally. Biopsied. - Z-line irregular, 40 cm from the incisors. - 3 cm hiatal hernia. - No gross lesions in the stomach. - Normal mucosa was found in the entire examined duodenum. - Normal mucosa was found in the visualized proximal jejunum. Tattooed distal extent. - Successful completion of the Video Capsule Enteroscope placement.  Antimicrobials:  Anti-infectives (From admission, onward)    None         Micro    Objective   Vitals:   12/25/20 2221 12/26/20 0435 12/26/20 1212 12/26/20 1820  BP: 136/74 (!) 155/79 (!) 155/81 (!) 166/89  Pulse: 77 81 79 80  Resp: 20 19 16 16   Temp: 98.8 F (37.1 C) 98 F (36.7 C) 98.5 F (36.9 C) 98.2 F (36.8 C)  TempSrc: Oral Oral Oral Oral  SpO2: 97% 96% 97% 100%  Weight:  60.6 kg    Height:         Intake/Output Summary (Last 24 hours) at 12/26/2020 2133 Last data filed at 12/26/2020 1634 Gross per 24 hour  Intake 600 ml  Output --  Net 600 ml   Filed Weights   12/23/20 2017 12/25/20 0438 12/26/20 0435  Weight: 61 kg 61.1 kg 60.6 kg    Physical Exam:  General exam: awake, alert, no acute distress Respiratory system: normal respiratory effort, on room air. Cardiovascular system: RRR, no pedal edema.   Gastrointestinal system: no distention, soft. Central nervous system: A&O x3. no gross focal neurologic deficits, normal speech Extremities: no edema Psychiatry: normal mood, congruent affect, judgement and insight appear normal  Labs   Data Reviewed: I have personally reviewed following labs  and imaging studies  CBC: Recent Labs  Lab 12/24/20 0729 12/24/20 1659 12/25/20 0510 12/25/20 1650 12/26/20 0334 12/26/20 1423  WBC 19.2* 17.3* 12.9* 10.7* 12.6*  --   NEUTROABS 17.9*  --   --   --   --   --   HGB 11.0* 10.0* 9.6* 9.9* 9.4* 9.7*  HCT 32.7* 29.9* 28.3* 30.3* 28.4* 30.1*  MCV 92.9 91.7 93.1 93.5 92.2  --   PLT 364 352 311 328 316  --    Basic Metabolic Panel: Recent Labs  Lab 12/22/20 1524 12/23/20 1524 12/24/20 0729 12/25/20 0510 12/26/20 0334  NA 139 136 140 134* 137  K 4.6 4.4 4.5 3.6 3.6  CL 106 105 107 103 107  CO2 20 21* 18* 19* 20*  GLUCOSE 90 218* 191* 160* 143*  BUN 58* 49* 49* 47* 47*  CREATININE 3.76* 3.72* 3.74* 3.36* 3.51*  CALCIUM 8.6 8.6* 8.7* 8.1* 8.3*   GFR: Estimated Creatinine Clearance: 16.1 mL/min (A) (by C-G formula based on SCr of 3.51 mg/dL (H)). Liver Function Tests: Recent Labs  Lab 12/22/20 1524 12/23/20 1524  AST 15 16  ALT 19 21  ALKPHOS 99 87  BILITOT 0.4 0.5  PROT 6.8 7.0  ALBUMIN 4.1 3.4*   No results for input(s): LIPASE, AMYLASE in the last 168 hours. No results for input(s): AMMONIA in the last 168 hours. Coagulation Profile: Recent Labs  Lab 12/23/20 1547  INR 1.1   Cardiac Enzymes: No  results for input(s): CKTOTAL, CKMB, CKMBINDEX, TROPONINI in the last 168 hours. BNP (last 3 results) No results for input(s): PROBNP in the last 8760 hours. HbA1C: No results for input(s): HGBA1C in the last 72 hours.  CBG: Recent Labs  Lab 12/25/20 2049 12/26/20 0600 12/26/20 1109 12/26/20 1618 12/26/20 2058  GLUCAP 129* 144* 130* 131* 148*   Lipid Profile: No results for input(s): CHOL, HDL, LDLCALC, TRIG, CHOLHDL, LDLDIRECT in the last 72 hours.  Thyroid Function Tests: No results for input(s): TSH, T4TOTAL, FREET4, T3FREE, THYROIDAB in the last 72 hours.  Anemia Panel: No results for input(s): VITAMINB12, FOLATE, FERRITIN, TIBC, IRON, RETICCTPCT in the last 72 hours.  Sepsis Labs: No results for input(s): PROCALCITON, LATICACIDVEN in the last 168 hours.  Recent Results (from the past 240 hour(s))  Resp Panel by RT-PCR (Flu A&B, Covid) Nasopharyngeal Swab     Status: None   Collection Time: 12/23/20  5:29 PM   Specimen: Nasopharyngeal Swab; Nasopharyngeal(NP) swabs in vial transport medium  Result Value Ref Range Status   SARS Coronavirus 2 by RT PCR NEGATIVE NEGATIVE Final    Comment: (NOTE) SARS-CoV-2 target nucleic acids are NOT DETECTED.  The SARS-CoV-2 RNA is generally detectable in upper respiratory specimens during the acute phase of infection. The lowest concentration of SARS-CoV-2 viral copies this assay can detect is 138 copies/mL. A negative result does not preclude SARS-Cov-2 infection and should not be used as the sole basis for treatment or other patient management decisions. A negative result may occur with  improper specimen collection/handling, submission of specimen other than nasopharyngeal swab, presence of viral mutation(s) within the areas targeted by this assay, and inadequate number of viral copies(<138 copies/mL). A negative result must be combined with clinical observations, patient history, and epidemiological information. The expected  result is Negative.  Fact Sheet for Patients:  EntrepreneurPulse.com.au  Fact Sheet for Healthcare Providers:  IncredibleEmployment.be  This test is no t yet approved or cleared by the [place] and  has been authorized  for detection and/or diagnosis of SARS-CoV-2 by FDA under an Emergency Use Authorization (EUA). This EUA will remain  in effect (meaning this test can be used) for the duration of the COVID-19 declaration under Section 564(b)(1) of the Act, 21 U.S.C.section 360bbb-3(b)(1), unless the authorization is terminated  or revoked sooner.       Influenza A by PCR NEGATIVE NEGATIVE Final   Influenza B by PCR NEGATIVE NEGATIVE Final    Comment: (NOTE) The Xpert Xpress SARS-CoV-2/FLU/RSV plus assay is intended as an aid in the diagnosis of influenza from Nasopharyngeal swab specimens and should not be used as a sole basis for treatment. Nasal washings and aspirates are unacceptable for Xpert Xpress SARS-CoV-2/FLU/RSV testing.  Fact Sheet for Patients: EntrepreneurPulse.com.au  Fact Sheet for Healthcare Providers: IncredibleEmployment.be  This test is not yet approved or cleared by the [place] FDA and has been authorized for detection and/or diagnosis of SARS-CoV-2 by FDA under an Emergency Use Authorization (EUA). This EUA will remain in effect (meaning this test can be used) for the duration of the COVID-19 declaration under Section 564(b)(1) of the Act, 21 U.S.C. section 360bbb-3(b)(1), unless the authorization is terminated or revoked.  Performed at [facility], [place] [address] [place], [place] [address]       Imaging Studies   No results found.   Medications   Scheduled Meds:  amiodarone  200 mg Oral Daily   atorvastatin  80 mg Oral QHS   citalopram  20 mg Oral Daily   diltiazem  180 mg Oral Daily   insulin aspart  0-9 Units Subcutaneous TID WC   linagliptin   5 mg Oral Daily   nicotine  7 mg Transdermal Daily   sodium chloride flush  3 mL Intravenous Q12H   tamsulosin  0.4 mg Oral QPC supper   Continuous Infusions:  sodium chloride     sodium chloride     sodium chloride         LOS: 3 days    Time spent: 25 minutes    Ezekiel Slocumb, DO [facility]  12/26/2020, 9:33 PM      If 7PM-7AM, please contact night-coverage. How to contact the [facility] Attending or Consulting provider [place] or covering provider during after hours [place], for this patient?    Check the care team in [facility] and look for a) attending/consulting [facility] provider listed and b) the [facility] team listed Log into [email or web address] and use [facility]'s universal password to access. If you do not have the password, please contact the hospital operator. Locate the [facility] provider you are looking for under [facility] and page to a number that you can be directly reached. If you still have difficulty reaching the provider, please page the [facility] (Director on Call) for the Hospitalists listed on amion for assistance.

## 2020-12-26 NOTE — Care Management Important Message (Signed)
Important Message  Patient Details  Name: Adam Reid MRN: [id number] Date of Birth: 05-06-1946   Medicare Important Message Given:  Yes - Important Message mailed due to current National Emergency  Verbal consent obtained due to current National Emergency  Relationship to patient: Self Contact Name: Riker Call Date: 12/26/20  Time: 1059 Phone: [phone] Outcome: No Answer/Busy Important Message mailed to: Patient address on file    Delorse Lek 12/26/2020, 11:00 AM

## 2020-12-27 ENCOUNTER — Encounter (HOSPITAL_COMMUNITY): Payer: Self-pay | Admitting: Gastroenterology

## 2020-12-27 ENCOUNTER — Encounter: Payer: Self-pay | Admitting: Gastroenterology

## 2020-12-27 LAB — CBC
HCT: 31.1 % — ABNORMAL LOW (ref 39.0–52.0)
Hemoglobin: 10 g/dL — ABNORMAL LOW (ref 13.0–17.0)
MCH: 30.9 pg (ref 26.0–34.0)
MCHC: 32.2 g/dL (ref 30.0–36.0)
MCV: 96 fL (ref 80.0–100.0)
Platelets: 339 10*3/uL (ref 150–400)
RBC: 3.24 MIL/uL — ABNORMAL LOW (ref 4.22–5.81)
RDW: 17.3 % — ABNORMAL HIGH (ref 11.5–15.5)
WBC: 10.5 10*3/uL (ref 4.0–10.5)
nRBC: 0 % (ref 0.0–0.2)

## 2020-12-27 LAB — GLUCOSE, CAPILLARY
Glucose-Capillary: 134 mg/dL — ABNORMAL HIGH (ref 70–99)
Glucose-Capillary: 186 mg/dL — ABNORMAL HIGH (ref 70–99)
Glucose-Capillary: 164 mg/dL — ABNORMAL HIGH (ref 70–99)

## 2020-12-27 MED ORDER — FERROUS SULFATE 325 (65 FE) MG PO TABS: 325.0000 mg | ORAL_TABLET | Freq: Every day | ORAL | 1 refills | Status: DC

## 2020-12-27 MED ORDER — SENNOSIDES 8.6 MG PO TABS: 1.0000 | ORAL_TABLET | ORAL | Status: DC | PRN

## 2020-12-27 MED ORDER — TORSEMIDE 20 MG PO TABS: 20.0000 mg | ORAL_TABLET | ORAL | 2 refills | Status: DC | PRN

## 2020-12-27 NOTE — Discharge Summary (Signed)
Physician Discharge Summary  Adam Reid SWF:[id number] DOB: 23-Mar-1946 DOA: 12/23/2020  PCP: [name], MD  Admit date: 12/23/2020 Discharge date: 12/27/2020  Admitted From: home Disposition:  home  Recommendations for Outpatient Follow-up:  Follow up with PCP in 1-2 weeks Please obtain BMP/CBC in one week Please follow up on the following pending results: video capsule endoscopy final results Repeat iron studies in 6 weeks   Home Health: No Equipment/Devices: None  Discharge Condition: Stable  CODE STATUS: Full  Diet recommendation: Heart Healthy / Carb Modified      Discharge Diagnoses: Principal Problem:   Symptomatic anemia Active Problems:   HTN (hypertension)   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 Rogers Blocker: "Adam Reid is a 75 y.o. male with medical history significant of HTN, DM2, CKD stage 4, paroxysmal afib on eliquis, non obstructive CAD, diastolic HF, ACD, BPH and depression who presented to ER after PCP called and told him his hgb was low and to come to ER. He was seen yesterday at PCP for regular follow up, but since last week he has had increased fatigue, legs are tingling and mad mood. He denies any shortness of breath at rest. His wife reports black stool since last week.  No other obvious bruising or bleeding. No chest pain, palpitations, headaches, vision changes. No cough, no N/V/D. No abdominal pain. He is eating and drinking normal. Normal urination.    Last colonoscopy: last year  EGD: last year. No family of colon cancer. No NSAIDs/steroid use. No history of GI bleed. [facility] in  2021 with anemia and had upper and lower endoscopy w/o bleeding source.   ED Course: vitals: BP 136/70, HR: 77, RR: 18, afebrile and oxygen 100% on RA. Hgb found to be 6.3 PCP office. Hgb today is 6.4.  Fecal occult + here. ER physician has ordered 2 units of blood, given IV protonix and consulted GI. They will plan on seeing him."  Admitted to [facility] service with Gastroenterology consulted for further evaluation of acute GI bleeding.     Discharge Plan Hold Eliquis until 12/30/20. Start daily oral iron supplement. GI will contact patient with final VCE results and for follow up scheduling.     Acute on chronic anemia due to likely GI bleeding GI bleed GI bleed GI bleed -  presented with Hbg 6.4, down from 9.1 two weeks prior. +Occult stool.  Iron 38, ferritin 581. 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 Rush Landmark --GI consulted - see their recs --Video capsule study formal results pending - to be f/b GI and discuss with pt  --Trend Hbg, transfuse if < 7.0 --Oral PPI --IV iron infusion given 6/16, discharge on PO iron --Follow up biopsy of distal esophagus (?Barrett's) --Repeat iron studies in 6 weeks   Hypertension -chronic, stable.  Uncontrolled on home diltiazem 120 mg PO daily.  Increased to 180 mg  Chronic diastolic CHF -Echo 08/2023: EF 55 to 42%, grade 3 diastolic dysfunction. --Appears he used to follow with heart  failure clinic but not but in a while.  Clinically appears euvolemic and compensated. Farxiga held.   Paroxysmal A. Fib -rate controlled, in normal sinus rhythm.  Continue amiodarone.  Hold Eliquis in setting of above.  Monitored on telemetry without notable events.   CKD stage IV -diabetic nephropathy.  Monitor renal function.  Avoid nephrotoxins and renally dose meds when indicated  Type 2 diabetes with renal manifestations -A1c 6.0.  Well  controlled.   Held glipizide and Farxiga, resume on d/c.   Continue Januvia.   Covered with sliding scale NovoLog during admission  Hyperlipidemia /nonobstructive CAD -continue statin .  LDL is at goal at 46   BPH - cont home med   Current smoker - nicotine patch ordered during admission   Depression - recently stopped Celexa on his own 2-3 weeks ago.  PCP resumed at visit day before admission.  Continue Celexa.     Discharge Instructions   Discharge Instructions     (HEART FAILURE PATIENTS) Call MD:  Anytime you have any of the following symptoms: 1) 3 pound weight gain in 24 hours or 5 pounds in 1 week 2) shortness of breath, with or without a dry hacking cough 3) swelling in the hands, feet or stomach 4) if you have to sleep on extra pillows at night in order to breathe.   Complete by: As directed    Call MD for:  extreme fatigue   Complete by: As directed    Call MD for:  persistant dizziness or light-headedness   Complete by: As directed    Call MD for:  persistant nausea and vomiting   Complete by: As directed    Call MD for:  severe uncontrolled pain   Complete by: As directed    Call MD for:  temperature >100.4   Complete by: As directed    Diet - low sodium heart healthy   Complete by: As directed    Discharge instructions   Complete by: As directed    Please HOLD ELIQUIS when you go home for another few days.  You may RESTART ELIQUIS on 12/30/2020, per Dr. Rush Landmark.  His office will contact you with follow up scheduling and to discuss the final results of the video capsule endoscopy study.   Increase activity slowly   Complete by: As directed       Allergies as of 12/27/2020   No Known Allergies      Medication List     STOP taking these medications    apixaban 2.5 MG Tabs tablet Commonly known as: Eliquis       TAKE these medications    amiodarone 200 MG tablet Commonly known as: PACERONE Take 1 tablet (200 mg total) by mouth daily. Please  call for office viist [phone] What changed: additional instructions   atorvastatin 80 MG tablet Commonly known as: LIPITOR TAKE 1 TABLET(80 MG) BY MOUTH DAILY What changed:  how much to take how to take this when to take this additional instructions   blood glucose meter kit and supplies Use up to two times daily as directed  ICD10 E10.9 E11.9   citalopram 20 MG tablet Commonly known as: CELEXA TAKE 1 TABLET(20 MG) BY MOUTH DAILY What changed:  how much to take how to take this when to take this additional instructions   dapagliflozin propanediol 5 MG Tabs tablet Commonly known as: FARXIGA Take 1 tablet (5 mg total) by mouth daily.   diltiazem 180 MG 24 hr capsule Commonly known  as: CARDIZEM CD TAKE 1 CAPSULE(180 MG) BY MOUTH DAILY What changed: See the new instructions.   ferrous sulfate 325 (65 FE) MG tablet Take 1 tablet (325 mg total) by mouth daily.   glipiZIDE 5 MG 24 hr tablet Commonly known as: GLUCOTROL XL TAKE 1 TABLET(5 MG) BY MOUTH DAILY WITH BREAKFAST What changed:  how much to take how to take this when to take this additional instructions   GlucoCom Lancets Misc Use for home glucose monitoring   OneTouch Verio test strip Generic drug: glucose blood Test up to 2 times per day.  Uncontrolled diabetes with hyperglycemia and stage 4 CKD.   OneTouch Verio w/Device Kit as directed.   pantoprazole 40 MG tablet Commonly known as: PROTONIX Take 1 tablet (40 mg total) by mouth daily.   polyethylene glycol 17 g packet Commonly known as: MIRALAX / GLYCOLAX Take 17 g by mouth daily as needed for mild constipation or moderate constipation.   senna 8.6 MG tablet Commonly known as: SENOKOT Take 1 tablet (8.6 mg total) by mouth as needed for constipation.   sitaGLIPtin 25 MG tablet Commonly known as: Januvia TAKE 1 TABLET(25 MG) BY MOUTH DAILY What changed:  how much to take how to take this when to take this additional instructions    tamsulosin 0.4 MG Caps capsule Commonly known as: FLOMAX TAKE 2 CAPSULES(0.8 MG) BY MOUTH DAILY What changed: See the new instructions.   torsemide 20 MG tablet Commonly known as: DEMADEX Take 1 tablet (20 mg total) by mouth as needed.   vitamin B-12 500 MCG tablet Commonly known as: CYANOCOBALAMIN Take 500 mcg by mouth daily. Reported on 12/02/2015        No Known Allergies   If you experience worsening of your admission symptoms, develop shortness of breath, life threatening emergency, suicidal or homicidal thoughts you must seek medical attention immediately by calling 911 or calling your MD immediately  if symptoms less severe.    Please note   You were cared for by a hospitalist during your hospital stay. If you have any questions about your discharge medications or the care you received while you were in the hospital after you are discharged, you can call the unit and asked to speak with the hospitalist on call if the hospitalist that took care of you is not available. Once you are discharged, your primary care physician will handle any further medical issues. Please note that NO REFILLS for any discharge medications will be authorized once you are discharged, as it is imperative that you return to your primary care physician (or establish a relationship with a primary care physician if you do not have one) for your aftercare needs so that they can reassess your need for medications and monitor your lab values.   Consultations: Gastroenterology   Procedures/Studies: No results found.  Small bowel enteroscopy Video capsule endoscopy    Subjective: Pt feels well today.  No further episodes of bloody or dark stools.  No abdominal pian, N/V or other complaints.    Discharge Exam: Vitals:   12/27/20 0514 12/27/20 1110  BP: (!) 160/76 (!) 164/90  Pulse: 80 82  Resp: 18 18  Temp: 98.6 F (37 C) 98 F (36.7 C)  SpO2: 96% 96%   Vitals:   12/26/20 1820 12/26/20  2318 12/27/20 0514 12/27/20 1110  BP: (!) 166/89 (!) 143/79 (!) 160/76 (!) 164/90  Pulse: 80 81 80 82  Resp: $Remo'16 18 18 18  'OMWlm$ Temp: 98.2 F (36.8 C)  98.7 F (37.1 C) 98.6 F (37 C) 98 F (36.7 C)  TempSrc: Oral Oral Oral Oral  SpO2: 100% 97% 96% 96%  Weight:   60.3 kg   Height:        General: Pt is alert, awake, not in acute distress Cardiovascular: RRR, S1/S2 +, no rubs, no gallops Respiratory: CTA bilaterally, no wheezing, no rhonchi Abdominal: Soft, NT, ND, bowel sounds + Extremities: no edema, no cyanosis    The results of significant diagnostics from this hospitalization (including imaging, microbiology, ancillary and laboratory) are listed below for reference.     Microbiology: Recent Results (from the past 240 hour(s))  Resp Panel by RT-PCR (Flu A&B, Covid) Nasopharyngeal Swab     Status: None   Collection Time: 12/23/20  5:29 PM   Specimen: Nasopharyngeal Swab; Nasopharyngeal(NP) swabs in vial transport medium  Result Value Ref Range Status   SARS Coronavirus 2 by RT PCR NEGATIVE NEGATIVE Final    Comment: (NOTE) SARS-CoV-2 target nucleic acids are NOT DETECTED.  The SARS-CoV-2 RNA is generally detectable in upper respiratory specimens during the acute phase of infection. The lowest concentration of SARS-CoV-2 viral copies this assay can detect is 138 copies/mL. A negative result does not preclude SARS-Cov-2 infection and should not be used as the sole basis for treatment or other patient management decisions. A negative result may occur with  improper specimen collection/handling, submission of specimen other than nasopharyngeal swab, presence of viral mutation(s) within the areas targeted by this assay, and inadequate number of viral copies(<138 copies/mL). A negative result must be combined with clinical observations, patient history, and epidemiological information. The expected result is Negative.  Fact Sheet for Patients:   EntrepreneurPulse.com.au  Fact Sheet for Healthcare Providers:  IncredibleEmployment.be  This test is no t yet approved or cleared by the [place] FDA and  has been authorized for detection and/or diagnosis of SARS-CoV-2 by FDA under an Emergency Use Authorization (EUA). This EUA will remain  in effect (meaning this test can be used) for the duration of the COVID-19 declaration under Section 564(b)(1) of the Act, 21 U.S.C.section 360bbb-3(b)(1), unless the authorization is terminated  or revoked sooner.       Influenza A by PCR NEGATIVE NEGATIVE Final   Influenza B by PCR NEGATIVE NEGATIVE Final    Comment: (NOTE) The Xpert Xpress SARS-CoV-2/FLU/RSV plus assay is intended as an aid in the diagnosis of influenza from Nasopharyngeal swab specimens and should not be used as a sole basis for treatment. Nasal washings and aspirates are unacceptable for Xpert Xpress SARS-CoV-2/FLU/RSV testing.  Fact Sheet for Patients: EntrepreneurPulse.com.au  Fact Sheet for Healthcare Providers: IncredibleEmployment.be  This test is not yet approved or cleared by the [place] FDA and has been authorized for detection and/or diagnosis of SARS-CoV-2 by FDA under an Emergency Use Authorization (EUA). This EUA will remain in effect (meaning this test can be used) for the duration of the COVID-19 declaration under Section 564(b)(1) of the Act, 21 U.S.C. section 360bbb-3(b)(1), unless the authorization is terminated or revoked.  Performed at [facility], [place] [address] [place], [place] [address]      Labs: BNP (last 3 results) Recent Labs    12/23/20 2018  BNP 696.2*   Basic Metabolic Panel: Recent Labs  Lab 12/22/20 1524 12/23/20 1524 12/24/20 0729 12/25/20 0510 12/26/20 0334  NA 139 136 140 134* 137  K 4.6 4.4 4.5 3.6 3.6  CL 106 105 107 103 107  CO2 20 21* 18*  19* 20*  GLUCOSE 90 218* 191*  160* 143*  BUN 58* 49* 49* 47* 47*  CREATININE 3.76* 3.72* 3.74* 3.36* 3.51*  CALCIUM 8.6 8.6* 8.7* 8.1* 8.3*   Liver Function Tests: Recent Labs  Lab 12/22/20 1524 12/23/20 1524  AST 15 16  ALT 19 21  ALKPHOS 99 87  BILITOT 0.4 0.5  PROT 6.8 7.0  ALBUMIN 4.1 3.4*   No results for input(s): LIPASE, AMYLASE in the last 168 hours. No results for input(s): AMMONIA in the last 168 hours. CBC: Recent Labs  Lab 12/24/20 0729 12/24/20 1659 12/25/20 0510 12/25/20 1650 12/26/20 0334 12/26/20 1423 12/27/20 0642  WBC 19.2* 17.3* 12.9* 10.7* 12.6*  --  10.5  NEUTROABS 17.9*  --   --   --   --   --   --   HGB 11.0* 10.0* 9.6* 9.9* 9.4* 9.7* 10.0*  HCT 32.7* 29.9* 28.3* 30.3* 28.4* 30.1* 31.1*  MCV 92.9 91.7 93.1 93.5 92.2  --  96.0  PLT 364 352 311 328 316  --  339   Cardiac Enzymes: No results for input(s): CKTOTAL, CKMB, CKMBINDEX, TROPONINI in the last 168 hours. BNP: Invalid input(s): POCBNP CBG: Recent Labs  Lab 12/26/20 1109 12/26/20 1618 12/26/20 2058 12/27/20 0613 12/27/20 1107  GLUCAP 130* 131* 148* 134* 186*   D-Dimer No results for input(s): DDIMER in the last 72 hours. Hgb A1c No results for input(s): HGBA1C in the last 72 hours. Lipid Profile No results for input(s): CHOL, HDL, LDLCALC, TRIG, CHOLHDL, LDLDIRECT in the last 72 hours. Thyroid function studies No results for input(s): TSH, T4TOTAL, T3FREE, THYROIDAB in the last 72 hours.  Invalid input(s): FREET3 Anemia work up No results for input(s): VITAMINB12, FOLATE, FERRITIN, TIBC, IRON, RETICCTPCT in the last 72 hours. Urinalysis    Component Value Date/Time   COLORURINE YELLOW 12/23/2020 1524   APPEARANCEUR CLEAR 12/23/2020 1524   LABSPEC 1.014 12/23/2020 1524   PHURINE 5.0 12/23/2020 1524   GLUCOSEU >=500 (A) 12/23/2020 1524   HGBUR NEGATIVE 12/23/2020 1524   BILIRUBINUR NEGATIVE 12/23/2020 1524   BILIRUBINUR neg 03/23/2015 1404   KETONESUR NEGATIVE 12/23/2020 1524   PROTEINUR 100 (A)  12/23/2020 1524   UROBILINOGEN 0.2 03/23/2015 1404   NITRITE NEGATIVE 12/23/2020 1524   LEUKOCYTESUR NEGATIVE 12/23/2020 1524   Sepsis Labs Invalid input(s): PROCALCITONIN,  WBC,  LACTICIDVEN Microbiology Recent Results (from the past 240 hour(s))  Resp Panel by RT-PCR (Flu A&B, Covid) Nasopharyngeal Swab     Status: None   Collection Time: 12/23/20  5:29 PM   Specimen: Nasopharyngeal Swab; Nasopharyngeal(NP) swabs in vial transport medium  Result Value Ref Range Status   SARS Coronavirus 2 by RT PCR NEGATIVE NEGATIVE Final    Comment: (NOTE) SARS-CoV-2 target nucleic acids are NOT DETECTED.  The SARS-CoV-2 RNA is generally detectable in upper respiratory specimens during the acute phase of infection. The lowest concentration of SARS-CoV-2 viral copies this assay can detect is 138 copies/mL. A negative result does not preclude SARS-Cov-2 infection and should not be used as the sole basis for treatment or other patient management decisions. A negative result may occur with  improper specimen collection/handling, submission of specimen other than nasopharyngeal swab, presence of viral mutation(s) within the areas targeted by this assay, and inadequate number of viral copies(<138 copies/mL). A negative result must be combined with clinical observations, patient history, and epidemiological information. The expected result is Negative.  Fact Sheet for Patients:  EntrepreneurPulse.com.au  Fact Sheet for Healthcare Providers:  IncredibleEmployment.be  This test is no t yet approved or cleared by the [place] and  has been authorized for detection and/or diagnosis of SARS-CoV-2 by FDA under an Emergency Use Authorization (EUA). This EUA will remain  in effect (meaning this test can be used) for the duration of the COVID-19 declaration under Section 564(b)(1) of the Act, 21 U.S.C.section 360bbb-3(b)(1), unless the authorization is  terminated  or revoked sooner.       Influenza A by PCR NEGATIVE NEGATIVE Final   Influenza B by PCR NEGATIVE NEGATIVE Final    Comment: (NOTE) The Xpert Xpress SARS-CoV-2/FLU/RSV plus assay is intended as an aid in the diagnosis of influenza from Nasopharyngeal swab specimens and should not be used as a sole basis for treatment. Nasal washings and aspirates are unacceptable for Xpert Xpress SARS-CoV-2/FLU/RSV testing.  Fact Sheet for Patients: EntrepreneurPulse.com.au  Fact Sheet for Healthcare Providers: IncredibleEmployment.be  This test is not yet approved or cleared by the [place] FDA and has been authorized for detection and/or diagnosis of SARS-CoV-2 by FDA under an Emergency Use Authorization (EUA). This EUA will remain in effect (meaning this test can be used) for the duration of the COVID-19 declaration under Section 564(b)(1) of the Act, 21 U.S.C. section 360bbb-3(b)(1), unless the authorization is terminated or revoked.  Performed at [facility], [place] [address], [place], [place] [address]      Time coordinating discharge: Over 30 minutes  SIGNED:   Ezekiel Slocumb, DO [facility] 12/27/2020, 5:02 PM   If 7PM-7AM, please contact night-coverage [email or web address]

## 2020-12-27 NOTE — Progress Notes (Signed)
Patient discharged accompanied by family, alert and oriented in no distress. Assessment unchanged. Family and patient stated understanding of discharge AVS.

## 2020-12-27 NOTE — Progress Notes (Signed)
Some issues with reviewing the video capsule endoscopy at this time.  Will hopefully be resolved later today. Patient hemodynamically stable with stable hemogram. May be able to be discharged before finalization of capsule endoscopy but will try to update team later today.  Justice Britain, MD [facility] Advanced Endoscopy Office # [phone]

## 2020-12-27 NOTE — Progress Notes (Signed)
Patient has ordered for discharge. Given discharge instructions with paper to the patient. Iv removed. Given all belongings to the patient. 

## 2020-12-27 NOTE — Progress Notes (Signed)
Brief VCE Capsule report:  My brief report is placed here.  Formal finalization of report to be done by [facility] staff/team that read these capsules on a regular basis.  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  Tattoo noted in small bowel within 17 minutes of entering duodenum.  There are a few spots, not typical appearing AVMs in 3-5 locations well beyond the reach of a typical enteroscopy.  When this capsule is read formally, decision as to whether these could be atypical AVMs that could require other treatments will be made.  Recommend holding anticoagulation for a full 5-days since enteroscopy and then may restart.  Start Oral Iron once daily.  Plan for repeat CBC/Iron/TIBC/Ferritin in 6-weeks.  The [facility] team will reach out to patient when final read is complete.  There is no further inpatient GI evaluation required or necessary at this time.  This will be forwarded to the Inpatient medical service.   Justice Britain, MD [facility] Advanced Endoscopy Office # [phone]

## 2020-12-29 ENCOUNTER — Telehealth: Payer: Self-pay | Admitting: *Deleted

## 2020-12-29 NOTE — Telephone Encounter (Signed)
Transition Care Management Follow-up Telephone Call Date of discharge and from where: [facility]  How have you been since you were released from the hospital?    Feeling much better Any questions or concerns? No  Items Reviewed: Did the pt receive and understand the discharge instructions provided? Yes  Medications obtained and verified? No  Other? No  Any new allergies since your discharge? No  Dietary orders reviewed? No Do you have support at home? Yes   Home Care and Equipment/Supplies: Were home health services ordered? no If so, what is the name of the agency?   Has the agency set up a time to come to the patient's home? not applicable Were any new equipment or medical supplies ordered?  No What is the name of the medical supply agency?  Were you able to get the supplies/equipment? not applicable Do you have any questions related to the use of the equipment or supplies? No  Functional Questionnaire: (I = Independent and D = Dependent) ADLs:  INDEPENDENT   Bathing/Dressing- INDEPENDENT  Meal Prep- INDEPENDENT  Eating- INDEPENDENT  Maintaining continence- INDEPENDENT  Transferring/Ambulation- INDEPENDENT  Managing Meds- INDEPENDENT  Follow up appointments reviewed:  PCP Hospital f/u appt confirmed? Yes  Scheduled to see Adam Reid on 01-07-2021 @ 11:00. [facility] f/u appt confirmed?  Are transportation arrangements needed? No  If their condition worsens, is the pt aware to call PCP or go to the Emergency Dept.? Yes Was the patient provided with contact information for the PCP's office or ED? Yes Was to pt encouraged to call back with questions or concerns? Yes

## 2020-12-31 ENCOUNTER — Telehealth: Payer: Self-pay | Admitting: Gastroenterology

## 2020-12-31 NOTE — Telephone Encounter (Signed)
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 Tattoo at 18 minute mark Small red spots vs. AVMs noted in the mid to distal small bowel, out of reach of typical enteroscopy, would require balloon enteroscopy to reach.  No other high risk pathology noted.  Recommend resuming anticoagulation after a 5 day hold. Continue daily iron supplementation. Repeat Hgb next week and trend.   Beth, this is Dr. Woodward Ku outpatient, can you relay results of inpatient capsule endoscopy and recommendations and coordinate follow up with Dr. Silverio Decamp or APP for this issue in the next few months. Thanks

## 2021-01-01 NOTE — Telephone Encounter (Signed)
Spouse calls back. She does not use an interpreter. Advised of the results and the plan. The patient will see the PCP 01/07/21. She states his hgb will be checked there. Follow up appointment with Dr Silverio Decamp scheduled 03/06/21 at 1:30 pm.

## 2021-01-01 NOTE — Telephone Encounter (Signed)
Called the patient using [employer] [phone]. No answer. Message was left as to the nature of the call and a request for a call back.

## 2021-01-02 ENCOUNTER — Ambulatory Visit (HOSPITAL_COMMUNITY)
Admission: RE | Admit: 2021-01-02 | Discharge: 2021-01-02 | Disposition: A | Discharge status: Home / Self Care | Payer: Medicare Other | Source: Ambulatory Visit | Attending: Nephrology | Admitting: Nephrology

## 2021-01-02 ENCOUNTER — Other Ambulatory Visit: Payer: Self-pay

## 2021-01-02 VITALS — BP 121/61 | HR 80 | Resp 18

## 2021-01-02 DIAGNOSIS — D631 Anemia in chronic kidney disease: Secondary | ICD-10-CM | POA: Insufficient documentation

## 2021-01-02 DIAGNOSIS — N189 Chronic kidney disease, unspecified: Secondary | ICD-10-CM | POA: Diagnosis not present

## 2021-01-02 DIAGNOSIS — N179 Acute kidney failure, unspecified: Secondary | ICD-10-CM

## 2021-01-02 LAB — RENAL FUNCTION PANEL
Albumin: 3.3 g/dL — ABNORMAL LOW (ref 3.5–5.0)
Anion gap: 10 (ref 5–15)
BUN: 48 mg/dL — ABNORMAL HIGH (ref 8–23)
CO2: 23 mmol/L (ref 22–32)
Calcium: 8.6 mg/dL — ABNORMAL LOW (ref 8.9–10.3)
Chloride: 106 mmol/L (ref 98–111)
Creatinine, Ser: 3.38 mg/dL — ABNORMAL HIGH (ref 0.61–1.24)
GFR, Estimated: 18 mL/min — ABNORMAL LOW (ref >60)
Glucose, Bld: 295 mg/dL — ABNORMAL HIGH (ref 70–99)
Phosphorus: 4.8 mg/dL — ABNORMAL HIGH (ref 2.5–4.6)
Potassium: 3.9 mmol/L (ref 3.5–5.1)
Sodium: 139 mmol/L (ref 135–145)

## 2021-01-02 LAB — IRON AND TIBC
Iron: 34 ug/dL — ABNORMAL LOW (ref 45–182)
Saturation Ratios: 13 % — ABNORMAL LOW (ref 17.9–39.5)
TIBC: 258 ug/dL (ref 250–450)
UIBC: 224 ug/dL

## 2021-01-02 LAB — POCT HEMOGLOBIN-HEMACUE: Hemoglobin: 10.1 g/dL — ABNORMAL LOW (ref 13.0–17.0)

## 2021-01-02 LAB — MAGNESIUM: Magnesium: 2.7 mg/dL — ABNORMAL HIGH (ref 1.7–2.4)

## 2021-01-02 LAB — FERRITIN: Ferritin: 749 ng/mL — ABNORMAL HIGH (ref 24–336)

## 2021-01-02 MED ORDER — EPOETIN ALFA-EPBX 10000 UNIT/ML IJ SOLN
10000.0000 [IU] | INTRAMUSCULAR | Status: DC
  Administered 2021-01-02: 10000 [IU] via SUBCUTANEOUS

## 2021-01-02 MED ORDER — EPOETIN ALFA-EPBX 10000 UNIT/ML IJ SOLN
INTRAMUSCULAR | Status: AC
  Filled 2021-01-02: qty 1

## 2021-01-07 ENCOUNTER — Encounter: Payer: Self-pay | Admitting: Family Medicine

## 2021-01-07 ENCOUNTER — Ambulatory Visit: Payer: Medicare Other | Admitting: Family Medicine

## 2021-01-07 ENCOUNTER — Ambulatory Visit (INDEPENDENT_AMBULATORY_CARE_PROVIDER_SITE_OTHER): Payer: Medicare Other | Admitting: Family Medicine

## 2021-01-07 ENCOUNTER — Other Ambulatory Visit: Payer: Self-pay

## 2021-01-07 VITALS — BP 130/70 | HR 72 | Temp 98.3°F | Wt 131.6 lb

## 2021-01-07 DIAGNOSIS — K922 Gastrointestinal hemorrhage, unspecified: Secondary | ICD-10-CM

## 2021-01-07 DIAGNOSIS — I10 Essential (primary) hypertension: Secondary | ICD-10-CM

## 2021-01-07 DIAGNOSIS — F32A Depression, unspecified: Secondary | ICD-10-CM | POA: Diagnosis not present

## 2021-01-07 DIAGNOSIS — E1122 Type 2 diabetes mellitus with diabetic chronic kidney disease: Secondary | ICD-10-CM

## 2021-01-07 DIAGNOSIS — D509 Iron deficiency anemia, unspecified: Secondary | ICD-10-CM | POA: Diagnosis not present

## 2021-01-07 DIAGNOSIS — K2270 Barrett's esophagus without dysplasia: Secondary | ICD-10-CM

## 2021-01-07 LAB — CBC WITH DIFFERENTIAL/PLATELET
Basophils Absolute: 0.1 10*3/uL (ref 0.0–0.1)
Basophils Relative: 1.3 % (ref 0.0–3.0)
Eosinophils Absolute: 0.1 10*3/uL (ref 0.0–0.7)
Eosinophils Relative: 1.3 % (ref 0.0–5.0)
HCT: 32.1 % — ABNORMAL LOW (ref 39.0–52.0)
Hemoglobin: 10.7 g/dL — ABNORMAL LOW (ref 13.0–17.0)
Lymphocytes Relative: 4.1 % — ABNORMAL LOW (ref 12.0–46.0)
Lymphs Abs: 0.4 10*3/uL — ABNORMAL LOW (ref 0.7–4.0)
MCHC: 33.4 g/dL (ref 30.0–36.0)
MCV: 90.8 fl (ref 78.0–100.0)
Monocytes Absolute: 0.5 10*3/uL (ref 0.1–1.0)
Monocytes Relative: 4.6 % (ref 3.0–12.0)
Neutro Abs: 9.4 10*3/uL — ABNORMAL HIGH (ref 1.4–7.7)
Neutrophils Relative %: 88.7 % — ABNORMAL HIGH (ref 43.0–77.0)
Platelets: 496 10*3/uL — ABNORMAL HIGH (ref 150.0–400.0)
RBC: 3.53 Mil/uL — ABNORMAL LOW (ref 4.22–5.81)
RDW: 16.8 % — ABNORMAL HIGH (ref 11.5–15.5)
WBC: 10.6 10*3/uL — ABNORMAL HIGH (ref 4.0–10.5)

## 2021-01-07 LAB — BASIC METABOLIC PANEL
BUN: 55 mg/dL — ABNORMAL HIGH (ref 6–23)
CO2: 24 mEq/L (ref 19–32)
Calcium: 9.3 mg/dL (ref 8.4–10.5)
Chloride: 103 mEq/L (ref 96–112)
Creatinine, Ser: 3.62 mg/dL — ABNORMAL HIGH (ref 0.40–1.50)
GFR: 15.94 mL/min — ABNORMAL LOW (ref >60.00)
Glucose, Bld: 99 mg/dL (ref 70–99)
Potassium: 4.6 mEq/L (ref 3.5–5.1)
Sodium: 139 mEq/L (ref 135–145)

## 2021-01-07 NOTE — Patient Instructions (Addendum)
I will check blood counts today. If any increased fatigue, dark stools or lightheadedness be seen right away.   I will check labs including kidney test.   Same blood pressure med for now.   Most recent diabetes test looked good. If you continue to have blood sugars in the 200's let me know. I will check some labs today and recheck in 2 weeks. Bring your monitor to next visit.   Return to the clinic or go to the nearest emergency room if any of your symptoms worsen or new symptoms occur.

## 2021-01-07 NOTE — Progress Notes (Signed)
Subjective:  Patient ID: Adam Reid, male    DOB: [birth year]  Age: 75 y.o. MRN: [id number]  CC:  Chief Complaint  Patient presents with   Hospitalization Follow-up    Symptomatic anemia Hemoglobin at 6.3 three weeks ago, states he is feeling much better today. Has not restarted Eliquis, wanting to know when to restart    HPI Adam Reid presents for   Hospital follow-up.  Transition care management follow-up telephone call June 20. Here with spouse.   Symptomatic anemia Admitted June 14 through June 18. Increased fatigue noticed at last visit with me on June 13.  CBC obtained with hemoglobin 6.3, decreased from 9.1 on May 27.  Sent to ER for further evaluation.  Had noticed dark stools previously.  Chronic kidney disease.  Likely GI bleed as cause.  He had a Hemoccult stool positive.  Received 2 units packed red blood cell on admission.  Iron infusion given June 16 and discharged on p.o. iron, and Protonix 40 mg daily.  Evaluated by gastroenterology.   Endoscopy on June 16 followed by placement of capsule enteroscopy.  Submucosal nodule found in the esophagus, no gross lesions in mid esophagus, salmon-colored mucosal island distally which was biopsied: ESOPHAGUS, DISTAL, BIOPSY:  - Gastroesophageal mucosa with intestinal metaplasia consistent with Barrett's esophagus. - No dysplasia or carcinoma) 3 cm hiatal hernia, no gross lesions in the stomach, normal mucosa in the duodenum and visualized proximal jejunum  Capsule endoscopy June 16, blood noted in esophagus hiatal hernia likely from prior biopsies of the area post enteroscopy.  Small red spots versus AVMs in the mid to distal small bowel out of reach of typical enteroscopy.  No high risk pathology noted.  Recommended resuming anticoagulation after a 5-day hold, continue daily iron supplementation and repeat hemoglobin within a week with trending.  Notes entered June 22.  He is followed by nephrology.  He did receive  erythropoietin June 24. He has a history of paroxysmal A. fib, but in the setting of GI bleed Eliquis was held.  Continued on amiodarone.  Was monitored on telemetry without notable events. Hypertension was uncontrolled so diltiazem  increased to 180 mg daily  Feeling better. No black/tarry stools. Not lightheaded/dizzy. Still some fatigue, but better than last visit.  Home BP 128-130.  Lab Results  Component Value Date   WBC 10.6 (H) 01/07/2021   HGB 10.7 (L) 01/07/2021   HCT 32.1 (L) 01/07/2021   MCV 90.8 01/07/2021   PLT 496.0 (H) 01/07/2021   Depression Had self discontinued Celexa prior to last visit, restarted June 13. Mood has been ok. Able to go for walk yesterday.   Diabetes: With CKD.  Well-controlled.  Held glipizide and Farxiga during hospitalization, was covered with sliding scale NovoLog.  Continued on Januvia. Home readings - 170 average in morning. 230-240 after breakfast at times. Lowest 150. No symptomatic lows.   Lab Results  Component Value Date   HGBA1C 6.0 (A) 12/22/2020   HGBA1C 6.7 (A) 06/19/2020   HGBA1C 7.3 (H) 03/07/2020   Lab Results  Component Value Date   MICROALBUR 60.3 09/02/2015   LDLCALC 48 12/22/2020   CREATININE 3.62 (H) 01/07/2021    History Patient Active Problem List   Diagnosis Date Noted   Symptomatic anemia 12/23/2020   GI bleed 12/23/2020   Paroxysmal A-fib (HCC) 12/23/2020   Chronic diastolic CHF (congestive heart failure) (HCC) 12/23/2020   Acute CHF (congestive heart failure) (HCC) 09/24/2019   ARF (acute renal failure) (HCC) 09/24/2019  Anemia of chronic disease 09/24/2019   Acute respiratory failure with hypoxia (HCC)    Secondary hyperparathyroidism, 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   Smoker 03/26/2016   Hypotension 01/29/2016   Abnormal myocardial perfusion study 01/29/2016   Depression 12/09/2015   Hyperlipidemia LDL goal <100 06/28/2014   Diabetes mellitus with renal manifestations, controlled ([place])    HTN (hypertension)  06/24/2014   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[place]) 06/24/2014   BPH (benign prostatic hyperplasia) 06/24/2014   Past Medical History:  Diagnosis Date   Anemia    CHF (congestive heart failure) (HCC)    Chronic kidney disease    Depression    Diabetes mellitus without complication ([place])    Hyperlipidemia    Hypertension    Thyroid disease    was on supplement, taken off by [name]   Past Surgical History:  Procedure Laterality Date   APPENDECTOMY     BIOPSY  12/25/2020   Procedure: BIOPSY;  Surgeon: [name]., MD;  Location: [facility] ENDOSCOPY;  Service: Gastroenterology;;   CARDIAC CATHETERIZATION N/A 01/30/2016   Procedure: Left Heart Cath and Coronary Angiography;  Surgeon: [name], MD;  Location: [place] CV LAB;  Service: Cardiovascular;  Laterality: N/A;   ENTEROSCOPY N/A 12/25/2020   Procedure: ENTEROSCOPY;  Surgeon: [name], MD;  Location: [place];  Service: Gastroenterology;  Laterality: N/A;   GIVENS CAPSULE STUDY N/A 12/25/2020   Procedure: GIVENS CAPSULE STUDY;  Surgeon: [name]., MD;  Location: [place];  Service: Gastroenterology;  Laterality: N/A;   SUBMUCOSAL TATTOO INJECTION  12/25/2020   Procedure: SUBMUCOSAL TATTOO INJECTION;  Surgeon: [name], MD;  Location: [place];  Service: Gastroenterology;;   No Known Allergies Prior to Admission medications   Medication Sig Start Date End Date Taking? Authorizing Provider  amiodarone (PACERONE) 200 MG tablet Take 1 tablet (200 mg total) by mouth daily. Please call for office viist [phone] 10/27/20  Yes Bensimhon, Shaune Pascal, MD  atorvastatin (LIPITOR) 80 MG tablet TAKE 1 TABLET(80 MG) BY MOUTH DAILY 12/22/20  Yes [name], MD  blood glucose meter kit and supplies Use up to two times daily as directed  ICD10 E10.9 E11.9 02/20/19  Yes [name], MD  Blood Glucose Monitoring Suppl (ONETOUCH  VERIO) w/Device KIT as directed.  09/03/15  Yes [provider]  citalopram (CELEXA) 20 MG tablet TAKE 1 TABLET(20 MG) BY MOUTH DAILY 12/22/20  Yes [name], MD  cyanocobalamin 500 MCG tablet Take 500 mcg by mouth daily. Reported on 12/02/2015   Yes [provider]  dapagliflozin propanediol (FARXIGA) 5 MG TABS tablet Take 1 tablet (5 mg total) by mouth daily. 09/09/20  Yes Bensimhon, Shaune Pascal, MD  diltiazem (CARDIZEM CD) 180 MG 24 hr capsule TAKE 1 CAPSULE(180 MG) BY MOUTH DAILY 03/20/20  Yes Bensimhon, Shaune Pascal, MD  ferrous sulfate 325 (65 FE) MG tablet Take 1 tablet (325 mg total) by mouth daily. 12/27/20 02/25/21 Yes Nicole Kindred A, DO  glipiZIDE (GLUCOTROL XL) 5 MG 24 hr tablet TAKE 1 TABLET(5 MG) BY MOUTH DAILY WITH BREAKFAST 10/20/20  Yes [name], MD  GlucoCom Lancets MISC Use for home glucose monitoring 09/02/15  Yes Jeffery, Chelle, PA  glucose blood (ONETOUCH VERIO) test strip Test up to 2 times per day.  Uncontrolled diabetes with hyperglycemia and stage 4 CKD. 04/13/19  Yes [name], MD  pantoprazole (PROTONIX) 40 MG tablet Take 1 tablet (40  mg total) by mouth daily. 12/25/19  Yes Lemmon, Lavone Nian, PA  polyethylene glycol (MIRALAX / GLYCOLAX) 17 g packet Take 17 g by mouth daily as needed for mild constipation or moderate constipation.    Yes [provider]  senna (SENOKOT) 8.6 MG tablet Take 1 tablet (8.6 mg total) by mouth as needed for constipation. 12/27/20  Yes Nicole Kindred A, DO  sitaGLIPtin (JANUVIA) 25 MG tablet TAKE 1 TABLET(25 MG) BY MOUTH DAILY 12/22/20  Yes [name], MD  tamsulosin (FLOMAX) 0.4 MG CAPS capsule TAKE 2 CAPSULES(0.8 MG) BY MOUTH DAILY 01/08/20  Yes [name], MD  torsemide (DEMADEX) 20 MG tablet Take 1 tablet (20 mg total) by mouth as needed. 12/27/20  Yes Ezekiel Slocumb, DO   Social History   Socioeconomic History   Marital status: Married    Spouse name: [name]   Number of children: 1    Years of education: 12th grade   Highest education level: Not on file  Occupational History   Occupation: Retired-accountant  Tobacco Use   Smoking status: Light Smoker    Packs/day: 0.10    Years: 48.00    Pack years: 4.80    Types: Cigarettes    Last attempt to quit: 07/16/2019    Years since quitting: 1.4   Smokeless tobacco: Never   Tobacco comments:    per pt he has one cigarette every couple days  Vaping Use   Vaping Use: Never used  Substance and Sexual Activity   Alcohol use: No    Alcohol/week: 0.0 standard drinks   Drug use: No   Sexual activity: Never    Partners: Female  Other Topics Concern   Not on file  Social History Narrative   Originally from [place]. Came to the [place] in 2009, following their son who came here for school.   Married.   Lives with his wife.   Their adult son lives in [place], [place], where he is in optometry school.   Education: [employer]   Exercise: No   Social Determinants of Radio broadcast assistant Strain: Not on file  Food Insecurity: Not on file  Transportation Needs: Not on file  Physical Activity: Not on file  Stress: Not on file  Social Connections: Not on file  Intimate Partner Violence: Not on file    Review of Systems Per HPI  Objective:   Vitals:   01/07/21 1119  BP: 130/70  Pulse: 72  Temp: 98.3 F (36.8 C)  TempSrc: Temporal  SpO2: 97%  Weight: 131 lb 9.6 oz (59.7 kg)     Physical Exam Vitals reviewed.  Constitutional:      Appearance: He is well-developed.  HENT:     Head: Normocephalic and atraumatic.  Neck:     Vascular: No carotid bruit or JVD.  Cardiovascular:     Rate and Rhythm: Normal rate and regular rhythm.     Heart sounds: Normal heart sounds. No murmur heard. Pulmonary:     Effort: Pulmonary effort is normal.     Breath sounds: Normal breath sounds. No rales.  Abdominal:     General: There is no distension.     Tenderness: There is no abdominal tenderness.  Musculoskeletal:      Right lower leg: No edema.     Left lower leg: No edema.  Skin:    General: Skin is warm and dry.  Neurological:     Mental Status: He is alert and oriented to person,  place, and time.  Psychiatric:        Mood and Affect: Mood normal.       Assessment & Plan:  Falon Flinchum is a 75 y.o. male . Iron deficiency anemia, unspecified iron deficiency anemia type - Plan: CBC with Differential/Platelet Gastrointestinal hemorrhage, unspecified gastrointestinal hemorrhage type - Plan: CBC with Differential/Platelet  -Some persistent fatigue but may be multifactorial and overall feels improved versus hospitalization/admission.  Repeat CBC.  Continue iron supplementation.  ER precautions given for signs or symptoms of acute gastrointestinal bleeding.  Type 2 diabetes mellitus with chronic kidney disease, without long-term current use of insulin, unspecified CKD stage (HCC)  -Recent A1c indicates significantly improved control.  Home readings do not seem to correlate with most recent A1c and with history of hypoglycemia hesitant to adjust meds at this time.  Recheck 2 weeks with home meter.  Check BMP.  Depression, unspecified depression type  -Stable with restart of SSRI.  Essential hypertension - Plan: Basic Metabolic Panel (BMET)  -Stable on current regimen, check BMP  Barrett's esophagus without dysplasia  -Although CKD, with suspected GI bleed as above, continue PPI follow-up with gastroenterology, nephrology as planned.  Recheck 2 weeks as above.  No orders of the defined types were placed in this encounter.  Patient Instructions  I will check blood counts today. If any increased fatigue, dark stools or lightheadedness be seen right away.   I will check labs including kidney test.   Same blood pressure med for now.   Most recent diabetes test looked good. If you continue to have blood sugars in the 200's let me know. I will check some labs today and recheck in 2 weeks. Bring your  monitor to next visit.   Return to the clinic or go to the nearest emergency room if any of your symptoms worsen or new symptoms occur.      Signed,   Merri Ray, MD [place] 01/07/21 9:00 PM

## 2021-01-11 ENCOUNTER — Other Ambulatory Visit (HOSPITAL_COMMUNITY): Payer: Self-pay | Admitting: Cardiology

## 2021-01-13 NOTE — Telephone Encounter (Signed)
I don't see eliquis 2.5mg  on med list he qualifies I just am not sure I can renew38m, 59.7kg, lovw/bensimohn 03/07/20 Creatinine, Ser 01/07/21 0.40 - 1.50 mg/dL 3.62 High

## 2021-01-14 NOTE — Telephone Encounter (Signed)
Received a refill request for patient's Eliquis 2.74m BID.  Patient admitted on 12/23/20 for GI bleed and anemia.  Eliquis was held.    Current Scr: 3.62 CrCl: 133mmin Current eGFR: 15.94 CHAdsVASc: 5  Per Beer's criteria, avoid use in elderly patient with CrCL <25 mL/min.   However patient does have A Fib.  Continue to hold Eliquis?

## 2021-01-16 ENCOUNTER — Other Ambulatory Visit: Payer: Self-pay

## 2021-01-16 MED ORDER — PANTOPRAZOLE SODIUM 40 MG PO TBEC: 40.0000 mg | DELAYED_RELEASE_TABLET | Freq: Every day | ORAL | 3 refills | Status: DC

## 2021-01-23 ENCOUNTER — Ambulatory Visit (INDEPENDENT_AMBULATORY_CARE_PROVIDER_SITE_OTHER): Payer: Medicare Other | Admitting: Family Medicine

## 2021-01-23 ENCOUNTER — Other Ambulatory Visit: Payer: Self-pay

## 2021-01-23 VITALS — BP 128/78 | HR 76 | Temp 98.4°F | Resp 16 | Ht 66.0 in | Wt 132.0 lb

## 2021-01-23 DIAGNOSIS — D509 Iron deficiency anemia, unspecified: Secondary | ICD-10-CM | POA: Diagnosis not present

## 2021-01-23 DIAGNOSIS — E1122 Type 2 diabetes mellitus with diabetic chronic kidney disease: Secondary | ICD-10-CM | POA: Diagnosis not present

## 2021-01-23 LAB — GLUCOSE, POCT (MANUAL RESULT ENTRY): POC Glucose: 237 mg/dl — AB (ref 70–99)

## 2021-01-23 LAB — CBC WITH DIFFERENTIAL/PLATELET
Basophils Absolute: 0.1 10*3/uL (ref 0.0–0.1)
Basophils Relative: 1.2 % (ref 0.0–3.0)
Eosinophils Absolute: 0.2 10*3/uL (ref 0.0–0.7)
Eosinophils Relative: 1.4 % (ref 0.0–5.0)
HCT: 31 % — ABNORMAL LOW (ref 39.0–52.0)
Hemoglobin: 10.4 g/dL — ABNORMAL LOW (ref 13.0–17.0)
Lymphocytes Relative: 4.7 % — ABNORMAL LOW (ref 12.0–46.0)
Lymphs Abs: 0.5 10*3/uL — ABNORMAL LOW (ref 0.7–4.0)
MCHC: 33.7 g/dL (ref 30.0–36.0)
MCV: 89.3 fl (ref 78.0–100.0)
Monocytes Absolute: 0.5 10*3/uL (ref 0.1–1.0)
Monocytes Relative: 5 % (ref 3.0–12.0)
Neutro Abs: 9.6 10*3/uL — ABNORMAL HIGH (ref 1.4–7.7)
Neutrophils Relative %: 87.7 % — ABNORMAL HIGH (ref 43.0–77.0)
Platelets: 345 10*3/uL (ref 150.0–400.0)
RBC: 3.47 Mil/uL — ABNORMAL LOW (ref 4.22–5.81)
RDW: 16.1 % — ABNORMAL HIGH (ref 11.5–15.5)
WBC: 10.9 10*3/uL — ABNORMAL HIGH (ref 4.0–10.5)

## 2021-01-23 NOTE — Progress Notes (Signed)
Subjective:  Patient ID: Adam Reid, male    DOB: 1946/01/17  Age: 75 y.o. MRN: [id number]  CC:  Chief Complaint  Patient presents with   Anemia    Pt here for recheck anemia and fatigue today doing well no questions today, pt reports he does feel better than last visit     HPI Adam Reid presents for   Symptomatic anemia: Follow-up from June 29, hospitalized June 14 through June 18 with possible GI bleed as cause of anemia, low hemoglobin of 6.3.  Received transfusion, iron infusion and treated with Protonix.  Evaluated by gastroenterology including capsule endoscopy June 16.  Small red spots versus AVMs in the mid to distal small bowel out of reach of typical enteroscopy.  No high risk pathology noted.  Recommended resuming his anticoagulation after a 5-day hold, iron supplementation daily and continued monitoring of hemoglobin.  Additionally is followed by nephrology and receives erythropoietin June 24. (Given monthly)  On Eliquis and amiodarone for atrial fibrillation.    Hemoglobin improved from 10.0-10.7 last visit.  He has been feeling better.  Denies new blood in stool/dark tarry stools, fatigue or recurrent dizziness, or near syncope. Still some decreased energy with activity, but much better. No new bleeding since restarting Eliquis.  Still taking daily iron supplement.    Lab Results  Component Value Date   WBC 10.6 (H) 01/07/2021   HGB 10.7 (L) 01/07/2021   HCT 32.1 (L) 01/07/2021   MCV 90.8 01/07/2021   PLT 496.0 (H) 01/07/2021   Diabetes with CKD stage 4 followed by nephrology.  Glipizide, Wilder Glade were held during hospitalization, covered with sliding scale NovoLog.  Was continued on Januvia.  Home readings were low of 150 up to 240 at his last visit, 170 average in the morning without symptomatic lows.  Last A1c 6.0 on June 13.  Home readings did not seem to correlate with his recent control, advised to bring home meter to correlate here in office.  Continued  on Januvia $RemoveBe'25mg'qYpJlLCSy$  QD. He is taking glipzide $RemoveBeforeD'5mg'GNfUHuLynzxQlW$  qd as well since leaving hospital.   Since last visit - home readings average 160 in am, 2 hour postprandial up to 220. Did not bring meter.  No lows.  Last ate 2 hours ago.   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   Smoker 03/26/2016   Hypotension 01/29/2016   Abnormal myocardial perfusion study 01/29/2016   Depression 12/09/2015   Hyperlipidemia LDL goal <100 06/28/2014   Diabetes mellitus with renal manifestations, controlled ([place])    HTN (hypertension) 06/24/2014   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[place]) 06/24/2014   BPH (benign prostatic hyperplasia) 06/24/2014   Past Medical History:  Diagnosis Date   Anemia    CHF (congestive heart failure) (HCC)    Chronic kidney disease    Depression    Diabetes mellitus without complication ([place])    Hyperlipidemia    Hypertension    Thyroid disease    was on supplement, taken off by [name]   Past Surgical History:  Procedure Laterality Date  APPENDECTOMY     BIOPSY  12/25/2020   Procedure: BIOPSY;  Surgeon: Meridee Score Netty Starring., MD;  Location: [facility] ENDOSCOPY;  Service: Gastroenterology;;   CARDIAC CATHETERIZATION N/A 01/30/2016   Procedure: Left Heart Cath and Coronary Angiography;  Surgeon: Corky Crafts, MD;  Location: [facility] INVASIVE CV LAB;  Service: Cardiovascular;  Laterality: N/A;   ENTEROSCOPY N/A 12/25/2020   Procedure: ENTEROSCOPY;  Surgeon: Meridee Score Netty Starring., MD;  Location: [facility] ENDOSCOPY;  Service:  Gastroenterology;  Laterality: N/A;   GIVENS CAPSULE STUDY N/A 12/25/2020   Procedure: GIVENS CAPSULE STUDY;  Surgeon: Lemar Lofty., MD;  Location: [facility] ENDOSCOPY;  Service: Gastroenterology;  Laterality: N/A;   SUBMUCOSAL TATTOO INJECTION  12/25/2020   Procedure: SUBMUCOSAL TATTOO INJECTION;  Surgeon: Meridee Score Netty Starring., MD;  Location[facility] ENDOSCOPY;  Service: Gastroenterology;;   No Known Allergies Prior to Admission medications   Medication Sig Start Date End Date Taking? Authorizing Provider  amiodarone (PACERONE) 200 MG tablet Take 1 tablet (200 mg total) by mouth daily. Please call for office viist [phone] 10/27/20  Yes Bensimhon, Bevelyn Buckles, MD  atorvastatin (LIPITOR) 80 MG tablet TAKE 1 TABLET(80 MG) BY MOUTH DAILY 12/22/20  Yes Shade Flood, MD  blood glucose meter kit and supplies Use up to two times daily as directed  ICD10 E10.9 E11.9 02/20/19  Yes Shade Flood, MD  Blood Glucose Monitoring Suppl (ONETOUCH VERIO) w/Device KIT as directed.  09/03/15  Yes [provider]  citalopram (CELEXA) 20 MG tablet TAKE 1 TABLET(20 MG) BY MOUTH DAILY 12/22/20  Yes Shade Flood, MD  cyanocobalamin 500 MCG tablet Take 500 mcg by mouth daily. Reported on 12/02/2015   Yes [provider]  dapagliflozin propanediol (FARXIGA) 5 MG TABS tablet Take 1 tablet (5 mg total) by mouth daily. 09/09/20  Yes Bensimhon, Bevelyn Buckles, MD  diltiazem (CARDIZEM CD) 180 MG 24 hr capsule TAKE 1 CAPSULE(180 MG) BY MOUTH DAILY 03/20/20  Yes Bensimhon, Bevelyn Buckles, MD  ELIQUIS 2.5 MG TABS tablet TAKE 1 TABLET(2.5 MG) BY MOUTH TWICE DAILY 01/15/21  Yes Bensimhon, Bevelyn Buckles, MD  ferrous sulfate 325 (65 FE) MG tablet Take 1 tablet (325 mg total) by mouth daily. 12/27/20 02/25/21 Yes Esaw Grandchild A, DO  glipiZIDE (GLUCOTROL XL) 5 MG 24 hr tablet TAKE 1 TABLET(5 MG) BY MOUTH DAILY WITH BREAKFAST 10/20/20  Yes Shade Flood, MD  GlucoCom Lancets MISC Use for home glucose monitoring 09/02/15  Yes  Jeffery, Chelle, PA  glucose blood (ONETOUCH VERIO) test strip Test up to 2 times per day.  Uncontrolled diabetes with hyperglycemia and stage 4 CKD. 04/13/19  Yes Shade Flood, MD  pantoprazole (PROTONIX) 40 MG tablet Take 1 tablet (40 mg total) by mouth daily. 01/16/21  Yes Lemmon, Violet Baldy, PA  polyethylene glycol (MIRALAX / GLYCOLAX) 17 g packet Take 17 g by mouth daily as needed for mild constipation or moderate constipation.    Yes [provider]  senna (SENOKOT) 8.6 MG tablet Take 1 tablet (8.6 mg total) by mouth as needed for constipation. 12/27/20  Yes Esaw Grandchild A, DO  sitaGLIPtin (JANUVIA) 25 MG tablet TAKE 1 TABLET(25 MG) BY MOUTH DAILY 12/22/20  Yes Shade Flood, MD  tamsulosin (FLOMAX) 0.4 MG CAPS capsule TAKE 2 CAPSULES(0.8 MG) BY MOUTH DAILY 01/08/20  Yes Shade Flood, MD  torsemide (DEMADEX) 20 MG tablet Take 1 tablet (20 mg total) by mouth as needed. 12/27/20  Yes Pennie Banter, DO   Social History  Socioeconomic History   Marital status: Married    Spouse name: [name]   Number of children: 1   Years of education: 12th grade   Highest education level: Not on file  Occupational History   Occupation: Retired-accountant  Tobacco Use   Smoking status: Light Smoker    Packs/day: 0.10    Years: 48.00    Pack years: 4.80    Types: Cigarettes    Last attempt to quit: 07/16/2019    Years since quitting: 1.5   Smokeless tobacco: Never   Tobacco comments:    per pt he has one cigarette every couple days  Vaping Use   Vaping Use: Never used  Substance and Sexual Activity   Alcohol use: No    Alcohol/week: 0.0 standard drinks   Drug use: No   Sexual activity: Never    Partners: Female  Other Topics Concern   Not on file  Social History Narrative   Originally from [place]. Came to the [place] in 2009, following their son who came here for school.   Married.   Lives with his wife.   Their adult son lives in [place], [place], where he is in optometry  school.   Education: [employer]   Exercise: No   Social Determinants of Radio broadcast assistant Strain: Not on file  Food Insecurity: Not on file  Transportation Needs: Not on file  Physical Activity: Not on file  Stress: Not on file  Social Connections: Not on file  Intimate Partner Violence: Not on file    Review of Systems Per HPI.   Objective:   Vitals:   01/23/21 1046  BP: 128/78  Pulse: 76  Resp: 16  Temp: 98.4 F (36.9 C)  TempSrc: Temporal  SpO2: 97%  Weight: 132 lb (59.9 kg)  Height: $Remove'5\' 6"'OULYUol$  (1.676 m)     Physical Exam Vitals reviewed.  Constitutional:      Appearance: He is well-developed.  HENT:     Head: Normocephalic and atraumatic.  Neck:     Vascular: No carotid bruit or JVD.  Cardiovascular:     Rate and Rhythm: Normal rate and regular rhythm.     Heart sounds: Normal heart sounds. No murmur heard.    Comments: Regular rhythm, no ectopy.  Pulmonary:     Effort: Pulmonary effort is normal.     Breath sounds: Normal breath sounds. No rales.  Abdominal:     General: There is no distension.     Tenderness: There is no abdominal tenderness. There is no guarding.  Musculoskeletal:     Right lower leg: No edema.     Left lower leg: No edema.  Skin:    General: Skin is warm and dry.  Neurological:     Mental Status: He is alert and oriented to person, place, and time.  Psychiatric:        Mood and Affect: Mood normal.       Assessment & Plan:  Adam Reid is a 75 y.o. male . Type 2 diabetes mellitus with chronic kidney disease, without long-term current use of insulin, unspecified CKD stage ([place]) - Plan: POCT glucose (manual entry)  -Still some discrepancy between home readings and last A1c.  Continue same dose glipizide, Januvia for now with nurse visit within the next 2 weeks with his home meter to check postprandial glucose here with a point-of-care glucose in office to correlate.  1 month in person follow-up.  RTC/ER precautions  if new/worsening symptoms.  Iron  deficiency anemia, unspecified iron deficiency anemia type - Plan: CBC with Differential/Platelet  -Improving with subjective improvement since last visit, less fatigue.  Check CBC, no med changes.  Monitor for dark stools, signs or symptoms of bleeding.  Understanding expressed.   No orders of the defined types were placed in this encounter.  Patient Instructions  Home blood sugar readings are higher than expected based on your last 3 month test. Please bring meter to nurse visit to check your blood sugar in next 2 weeks. Be seen 2 hours after meal if possible.  Recheck blood counts today and in 1 month if stable.  Return to the clinic or go to the nearest emergency room if any of your symptoms worsen or new symptoms occur.    Signed,   Merri Ray, MD [place] 01/23/21 11:37 AM

## 2021-01-23 NOTE — Patient Instructions (Signed)
Home blood sugar readings are higher than expected based on your last 3 month test. Please bring meter to nurse visit to check your blood sugar in next 2 weeks. Be seen 2 hours after meal if possible.  Recheck blood counts today and in 1 month if stable.  Return to the clinic or go to the nearest emergency room if any of your symptoms worsen or new symptoms occur.

## 2021-01-24 ENCOUNTER — Other Ambulatory Visit (HOSPITAL_COMMUNITY): Payer: Self-pay | Admitting: Internal Medicine

## 2021-01-30 ENCOUNTER — Ambulatory Visit (HOSPITAL_COMMUNITY)
Admission: RE | Admit: 2021-01-30 | Discharge: 2021-01-30 | Disposition: A | Discharge status: Home / Self Care | Payer: Medicare Other | Source: Ambulatory Visit | Attending: Nephrology | Admitting: Nephrology

## 2021-01-30 ENCOUNTER — Other Ambulatory Visit: Payer: Self-pay

## 2021-01-30 VITALS — BP 132/71 | HR 78 | Temp 98.1°F

## 2021-01-30 DIAGNOSIS — N179 Acute kidney failure, unspecified: Secondary | ICD-10-CM

## 2021-01-30 DIAGNOSIS — N184 Chronic kidney disease, stage 4 (severe): Secondary | ICD-10-CM | POA: Diagnosis present

## 2021-01-30 DIAGNOSIS — D631 Anemia in chronic kidney disease: Secondary | ICD-10-CM | POA: Diagnosis not present

## 2021-01-30 LAB — IRON AND TIBC
Iron: 50 ug/dL (ref 45–182)
Saturation Ratios: 19 % (ref 17.9–39.5)
TIBC: 260 ug/dL (ref 250–450)
UIBC: 210 ug/dL

## 2021-01-30 LAB — FERRITIN: Ferritin: 815 ng/mL — ABNORMAL HIGH (ref 24–336)

## 2021-01-30 LAB — POCT HEMOGLOBIN-HEMACUE: Hemoglobin: 10.9 g/dL — ABNORMAL LOW (ref 13.0–17.0)

## 2021-01-30 MED ORDER — EPOETIN ALFA-EPBX 10000 UNIT/ML IJ SOLN
10000.0000 [IU] | INTRAMUSCULAR | Status: DC
  Administered 2021-01-30: 10000 [IU] via SUBCUTANEOUS

## 2021-01-30 MED ORDER — EPOETIN ALFA-EPBX 10000 UNIT/ML IJ SOLN
INTRAMUSCULAR | Status: AC
  Filled 2021-01-30: qty 1

## 2021-02-03 ENCOUNTER — Ambulatory Visit: Payer: Medicare Other

## 2021-02-06 ENCOUNTER — Ambulatory Visit: Payer: Medicare Other

## 2021-02-10 ENCOUNTER — Other Ambulatory Visit: Payer: Self-pay

## 2021-02-10 ENCOUNTER — Ambulatory Visit (INDEPENDENT_AMBULATORY_CARE_PROVIDER_SITE_OTHER): Payer: Medicare Other | Admitting: Registered Nurse

## 2021-02-10 ENCOUNTER — Ambulatory Visit: Payer: Medicare Other

## 2021-02-10 DIAGNOSIS — E1122 Type 2 diabetes mellitus with diabetic chronic kidney disease: Secondary | ICD-10-CM | POA: Diagnosis not present

## 2021-02-10 LAB — GLUCOSE, POCT (MANUAL RESULT ENTRY): POC Glucose: 296 mg/dl — AB (ref 70–99)

## 2021-02-10 NOTE — Progress Notes (Signed)
Pt here for glucose readings and comparison to home monitor.   Office reading 296 while pt monitor read 303   Pt states weeks average has been 160-165 and has not exceeded 180 in the time since his last OV

## 2021-02-27 ENCOUNTER — Other Ambulatory Visit: Payer: Self-pay

## 2021-02-27 ENCOUNTER — Ambulatory Visit (HOSPITAL_COMMUNITY)
Admission: RE | Admit: 2021-02-27 | Discharge: 2021-02-27 | Disposition: A | Discharge status: Home / Self Care | Payer: Medicare Other | Source: Ambulatory Visit | Attending: Nephrology | Admitting: Nephrology

## 2021-02-27 VITALS — BP 124/68 | HR 81 | Temp 98.3°F | Resp 18

## 2021-02-27 DIAGNOSIS — N179 Acute kidney failure, unspecified: Secondary | ICD-10-CM

## 2021-02-27 DIAGNOSIS — D631 Anemia in chronic kidney disease: Secondary | ICD-10-CM | POA: Insufficient documentation

## 2021-02-27 DIAGNOSIS — N184 Chronic kidney disease, stage 4 (severe): Secondary | ICD-10-CM | POA: Insufficient documentation

## 2021-02-27 LAB — POCT HEMOGLOBIN-HEMACUE: Hemoglobin: 11.1 g/dL — ABNORMAL LOW (ref 13.0–17.0)

## 2021-02-27 LAB — FERRITIN: Ferritin: 671 ng/mL — ABNORMAL HIGH (ref 24–336)

## 2021-02-27 LAB — IRON AND TIBC
Iron: 51 ug/dL (ref 45–182)
Saturation Ratios: 22 % (ref 17.9–39.5)
TIBC: 235 ug/dL — ABNORMAL LOW (ref 250–450)
UIBC: 184 ug/dL

## 2021-02-27 MED ORDER — EPOETIN ALFA-EPBX 10000 UNIT/ML IJ SOLN
INTRAMUSCULAR | Status: AC
  Filled 2021-02-27: qty 1

## 2021-02-27 MED ORDER — EPOETIN ALFA-EPBX 10000 UNIT/ML IJ SOLN
10000.0000 [IU] | INTRAMUSCULAR | Status: DC
  Administered 2021-02-27: 10000 [IU] via SUBCUTANEOUS

## 2021-03-02 ENCOUNTER — Other Ambulatory Visit: Payer: Self-pay

## 2021-03-02 ENCOUNTER — Ambulatory Visit (INDEPENDENT_AMBULATORY_CARE_PROVIDER_SITE_OTHER): Payer: Medicare Other | Admitting: Family Medicine

## 2021-03-02 ENCOUNTER — Other Ambulatory Visit: Payer: Self-pay | Admitting: Family Medicine

## 2021-03-02 VITALS — BP 118/74 | HR 73 | Temp 98.1°F | Resp 16 | Ht 66.0 in | Wt 131.0 lb

## 2021-03-02 DIAGNOSIS — R350 Frequency of micturition: Secondary | ICD-10-CM

## 2021-03-02 DIAGNOSIS — E1122 Type 2 diabetes mellitus with diabetic chronic kidney disease: Secondary | ICD-10-CM | POA: Diagnosis not present

## 2021-03-02 DIAGNOSIS — R32 Unspecified urinary incontinence: Secondary | ICD-10-CM

## 2021-03-02 DIAGNOSIS — Z8639 Personal history of other endocrine, nutritional and metabolic disease: Secondary | ICD-10-CM | POA: Diagnosis not present

## 2021-03-02 LAB — POCT URINALYSIS DIP (MANUAL ENTRY)
Bilirubin, UA: NEGATIVE
Glucose, UA: 500 mg/dL — AB
Leukocytes, UA: NEGATIVE
Nitrite, UA: NEGATIVE
Protein Ur, POC: >=300 mg/dL — AB
Spec Grav, UA: 1.025 (ref 1.010–1.025)
Urobilinogen, UA: 0.2 E.U./dL
pH, UA: 5 (ref 5.0–8.0)

## 2021-03-02 MED ORDER — GLIPIZIDE 5 MG PO TABS: 2.5000 mg | ORAL_TABLET | Freq: Two times a day (BID) | ORAL | 2 refills | Status: DC

## 2021-03-02 NOTE — Progress Notes (Signed)
Subjective:  Patient ID: Adam Reid, male    DOB: 1945/10/27  Age: 75 y.o. MRN: [id number]  CC:  Chief Complaint  Patient presents with   Diabetes    Pt here for 1 month f/u on high glucose readings, pt reports home readings 150- 170, pt wife reports frequent night time urination, as well as several occasions of bladder incontinence in recent months     HPI Elo Marmolejos presents for  Here with spouse - translating.  Diabetes: Complicated by chronic kidney disease stage IV followed by nephrology.  Glipizide and Wilder Glade were held during his hospitalization and cover with sliding scale NovoLog.  Was continued on his Januvia.  Some increased readings were discussed at last visit.  He was taking his Januvia 25 mg and glipizide 5 mg daily, and readings in the 160 up to 220 at that time.  Previous A1c was very well controlled.  In office correlation with his home meter recommended.  In office meter check on 8/2 - pt's meter 303, in office testing 296.  Has noticed some increased urinary frequency, few episodes of urinary incontinence past few months not making to bathroom in time. No fever, no abd pain, no dysuria.  Lab Results  Component Value Date   PSA 2.7 03/26/2016   PSA 3.09 03/23/2015   PSA 3.77 04/30/2014      Single low of 42 few weeks ago - sweating at the time, occurred around 5pm. No missed meals. No other lows. Otherwise lowest of 133.  Fasting: 133-160 Postprandial: 220 - 240 past few weeks. Breakfast, dinner main meals, small lunch. On glipizide $RemoveBefo'5mg'uuBptNGJQuu$  XL formulation.      Lab Results  Component Value Date   HGBA1C 6.0 (A) 12/22/2020   HGBA1C 6.7 (A) 06/19/2020   HGBA1C 7.3 (H) 03/07/2020   Lab Results  Component Value Date   MICROALBUR 60.3 09/02/2015   LDLCALC 48 12/22/2020   CREATININE 3.62 (H) 01/07/2021    History Patient Active Problem List   Diagnosis Date Noted   Symptomatic anemia 12/23/2020   GI bleed 12/23/2020   Paroxysmal A-fib (HCC)  12/23/2020   Chronic diastolic CHF (congestive heart failure) (HCC) 12/23/2020   Acute CHF (congestive heart failure) ([place]) 09/24/2019   ARF (acute renal failure) ([place]) 09/24/2019   Anemia of chronic disease 09/24/2019   Acute respiratory failure with hypoxia (HCC)    Secondary hyperparathyroidism, renal ([place]) 12/29/2016   Smoker 03/26/2016   Hypotension 01/29/2016   Abnormal myocardial perfusion study 01/29/2016   Depression 12/09/2015   Hyperlipidemia LDL goal <100 06/28/2014   Diabetes mellitus with renal manifestations, controlled ([place])    HTN (hypertension) 06/24/2014   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[place]) 06/24/2014   BPH (benign prostatic hyperplasia) 06/24/2014   Past Medical History:  Diagnosis Date   Anemia    CHF (congestive heart failure) (HCC)    Chronic kidney disease    Depression    Diabetes mellitus without complication ([place])    Hyperlipidemia    Hypertension    Thyroid disease    was on supplement, taken off by [name]   Past Surgical History:  Procedure Laterality Date   APPENDECTOMY     BIOPSY  12/25/2020   Procedure: BIOPSY;  Surgeon: [name]., MD;  Location: [facility] ENDOSCOPY;  Service: Gastroenterology;;   CARDIAC CATHETERIZATION N/A 01/30/2016   Procedure: Left Heart Cath and Coronary Angiography;  Surgeon: [name], MD;  Location: [place]  CV LAB;  Service: Cardiovascular;  Laterality: N/A;   ENTEROSCOPY N/A 12/25/2020   Procedure: ENTEROSCOPY;  Surgeon: [name], MD;  Location: [place];  Service: Gastroenterology;  Laterality: N/A;   GIVENS CAPSULE STUDY N/A 12/25/2020   Procedure: GIVENS CAPSULE STUDY;  Surgeon: [name]., MD;  Location: [place];  Service: Gastroenterology;  Laterality: N/A;   SUBMUCOSAL TATTOO INJECTION  12/25/2020   Procedure: SUBMUCOSAL TATTOO INJECTION;  Surgeon: [name], MD;  Location: [place];  Service:  Gastroenterology;;   No Known Allergies Prior to Admission medications   Medication Sig Start Date End Date Taking? Authorizing Provider  amiodarone (PACERONE) 200 MG tablet Take 1 tablet (200 mg total) by mouth daily. Follow up appt required for further refills. Please call [phone] to schedule an appt 01/28/21  Yes Bensimhon, Shaune Pascal, MD  atorvastatin (LIPITOR) 80 MG tablet TAKE 1 TABLET(80 MG) BY MOUTH DAILY 12/22/20  Yes [name], MD  blood glucose meter kit and supplies Use up to two times daily as directed  ICD10 E10.9 E11.9 02/20/19  Yes [name], MD  Blood Glucose Monitoring Suppl (ONETOUCH VERIO) w/Device KIT as directed.  09/03/15  Yes [provider]  citalopram (CELEXA) 20 MG tablet TAKE 1 TABLET(20 MG) BY MOUTH DAILY 12/22/20  Yes [name], MD  cyanocobalamin 500 MCG tablet Take 500 mcg by mouth daily. Reported on 12/02/2015   Yes [provider]  dapagliflozin propanediol (FARXIGA) 5 MG TABS tablet Take 1 tablet (5 mg total) by mouth daily. 09/09/20  Yes Bensimhon, Shaune Pascal, MD  diltiazem (CARDIZEM CD) 180 MG 24 hr capsule TAKE 1 CAPSULE(180 MG) BY MOUTH DAILY 03/20/20  Yes Bensimhon, Shaune Pascal, MD  ELIQUIS 2.5 MG TABS tablet TAKE 1 TABLET(2.5 MG) BY MOUTH TWICE DAILY 01/15/21  Yes Bensimhon, Shaune Pascal, MD  glipiZIDE (GLUCOTROL XL) 5 MG 24 hr tablet TAKE 1 TABLET(5 MG) BY MOUTH DAILY WITH BREAKFAST 10/20/20  Yes [name], MD  GlucoCom Lancets MISC Use for home glucose monitoring 09/02/15  Yes Jeffery, Chelle, PA  glucose blood (ONETOUCH VERIO) test strip Test up to 2 times per day.  Uncontrolled diabetes with hyperglycemia and stage 4 CKD. 04/13/19  Yes [name], MD  pantoprazole (PROTONIX) 40 MG tablet Take 1 tablet (40 mg total) by mouth daily. 01/16/21  Yes Lemmon, Lavone Nian, PA  polyethylene glycol (MIRALAX / GLYCOLAX) 17 g packet Take 17 g by mouth daily as needed for mild constipation or moderate constipation.    Yes  [provider]  senna (SENOKOT) 8.6 MG tablet Take 1 tablet (8.6 mg total) by mouth as needed for constipation. 12/27/20  Yes Nicole Kindred A, DO  sitaGLIPtin (JANUVIA) 25 MG tablet TAKE 1 TABLET(25 MG) BY MOUTH DAILY 12/22/20  Yes [name], MD  tamsulosin (FLOMAX) 0.4 MG CAPS capsule TAKE 2 CAPSULES(0.8 MG) BY MOUTH DAILY 01/08/20  Yes [name], MD  torsemide (DEMADEX) 20 MG tablet Take 1 tablet (20 mg total) by mouth as needed. 12/27/20  Yes Nicole Kindred A, DO  ferrous sulfate 325 (65 FE) MG tablet Take 1 tablet (325 mg total) by mouth daily. 12/27/20 02/25/21  Ezekiel Slocumb, DO   Social History   Socioeconomic History   Marital status: Married    Spouse name: [name]   Number of children: 1   Years of education: 12th grade   Highest education level: Not on file  Occupational History   Occupation: Retired-accountant  Tobacco Use   Smoking status: Light Smoker    Packs/day: 0.10    Years: 48.00    Pack years: 4.80    Types: Cigarettes    Last attempt to quit: 07/16/2019    Years since quitting: 1.6   Smokeless tobacco: Never   Tobacco comments:    per pt he has one cigarette every couple days  Vaping Use   Vaping Use: Never used  Substance and Sexual Activity   Alcohol use: No    Alcohol/week: 0.0 standard drinks   Drug use: No   Sexual activity: Never    Partners: Female  Other Topics Concern   Not on file  Social History Narrative   Originally from [place]. Came to the [place] in 2009, following their son who came here for school.   Married.   Lives with his wife.   Their adult son lives in [place], [place], where he is in optometry school.   Education: [employer]   Exercise: No   Social Determinants of Radio broadcast assistant Strain: Not on file  Food Insecurity: Not on file  Transportation Needs: Not on file  Physical Activity: Not on file  Stress: Not on file  Social Connections: Not on file  Intimate Partner Violence: Not on file     Review of Systems Per HPI.   Objective:   Vitals:   03/02/21 1343  BP: 118/74  Pulse: 73  Resp: 16  Temp: 98.1 F (36.7 C)  TempSrc: Temporal  SpO2: 97%  Weight: 131 lb (59.4 kg)  Height: $Remove'5\' 6"'OrUmdQI$  (1.676 m)     Physical Exam Vitals reviewed.  Constitutional:      General: He is not in acute distress.    Appearance: Normal appearance. He is well-developed. He is not ill-appearing, toxic-appearing or diaphoretic.  HENT:     Head: Normocephalic and atraumatic.  Neck:     Vascular: No carotid bruit or JVD.  Cardiovascular:     Rate and Rhythm: Normal rate and regular rhythm.     Heart sounds: Normal heart sounds. No murmur heard. Pulmonary:     Effort: Pulmonary effort is normal.     Breath sounds: Normal breath sounds. No rales.  Abdominal:     General: There is no distension.     Tenderness: There is no abdominal tenderness. There is no guarding.  Musculoskeletal:     Right lower leg: No edema.     Left lower leg: No edema.  Skin:    General: Skin is warm and dry.  Neurological:     Mental Status: He is alert and oriented to person, place, and time.  Psychiatric:        Mood and Affect: Mood normal.   Results for orders placed or performed in visit on 03/02/21  POCT urinalysis dipstick  Result Value Ref Range   Color, UA yellow yellow   Clarity, UA clear clear   Glucose, UA =500 (A) negative mg/dL   Bilirubin, UA negative negative   Ketones, POC UA trace (5) (A) negative mg/dL   Spec Grav, UA 1.025 1.010 - 1.025   Blood, UA trace-intact (A) negative   pH, UA 5.0 5.0 - 8.0   Protein Ur, POC >=300 (A) negative mg/dL   Urobilinogen, UA 0.2 0.2 or 1.0 E.U./dL   Nitrite, UA Negative Negative   Leukocytes, UA Negative Negative     Assessment & Plan:  Choya Tornow is a 75 y.o. male . Type 2 diabetes mellitus with  chronic kidney disease, without long-term current use of insulin, unspecified CKD stage ([place]) - Plan: glipiZIDE (GLUCOTROL) 5 MG  tablet History of hypoglycemia - Plan: glipiZIDE (GLUCOTROL) 5 MG tablet  -Change glipizide to short acting with 2.5 mg at breakfast and dinner, 2 largest meals.  No other change in meds.  Hypoglycemia precautions given and if that occurs for persistent difficulty with control will refer to endocrinology.  Recheck 1 month  Urinary frequency - Plan: Urine Microscopic, POCT urinalysis dipstick, PSA, Ambulatory referral to Urology Urinary incontinence, unspecified type - Plan: Urine Microscopic, POCT urinalysis dipstick, PSA, Ambulatory referral to Urology  -potentially related to hyperglycemia.  Urinalysis with glucose, proteinuria but also history of CKD.  Attempts at improved control of hyperglycemia as above, check PSA, urine micro to evaluate for true hematuria, and refer to urology.  RTC precautions if change in symptoms  Meds ordered this encounter  Medications   glipiZIDE (GLUCOTROL) 5 MG tablet    Sig: Take 0.5 tablets (2.5 mg total) by mouth 2 (two) times daily before a meal. Breakfast and dinner.    Dispense:  30 tablet    Refill:  2   Patient Instructions  Change extended release glipizide to immediate release but split that in half, take 2.5 mg with breakfast, 2.5 mg with dinner.  Continue to check your blood sugars and if any further low readings or readings in the 300s, return right away.  Otherwise recheck in 1 month.  Will check some urine tests and let you know if there are concerns but I am also referring you to urology.  Part of this frequent urination may be due to the elevated blood sugars.   Return to the clinic or go to the nearest emergency room if any of your symptoms worsen or new symptoms occur.    Signed,   Merri Ray, MD [place] [place] 03/02/21 2:51 PM

## 2021-03-02 NOTE — Patient Instructions (Signed)
Change extended release glipizide to immediate release but split that in half, take 2.5 mg with breakfast, 2.5 mg with dinner.  Continue to check your blood sugars and if any further low readings or readings in the 300s, return right away.  Otherwise recheck in 1 month.  Will check some urine tests and let you know if there are concerns but I am also referring you to urology.  Part of this frequent urination may be due to the elevated blood sugars.   Return to the clinic or go to the nearest emergency room if any of your symptoms worsen or new symptoms occur.

## 2021-03-03 ENCOUNTER — Other Ambulatory Visit: Payer: Medicare Other

## 2021-03-03 DIAGNOSIS — R350 Frequency of micturition: Secondary | ICD-10-CM

## 2021-03-03 LAB — URINALYSIS, MICROSCOPIC ONLY: RBC / HPF: NONE SEEN (ref 0–?)

## 2021-03-03 LAB — PSA: PSA: 2.78 ng/mL (ref 0.10–4.00)

## 2021-03-04 LAB — URINE CULTURE
MICRO NUMBER:: 12279474
SPECIMEN QUALITY:: ADEQUATE

## 2021-03-06 ENCOUNTER — Ambulatory Visit: Payer: Medicare Other | Admitting: Gastroenterology

## 2021-03-12 ENCOUNTER — Ambulatory Visit: Payer: Medicare Other | Admitting: Gastroenterology

## 2021-03-20 ENCOUNTER — Other Ambulatory Visit (HOSPITAL_COMMUNITY): Payer: Self-pay

## 2021-03-20 MED ORDER — DILTIAZEM HCL ER COATED BEADS 180 MG PO CP24: ORAL_CAPSULE | ORAL | 2 refills | Status: DC

## 2021-03-27 ENCOUNTER — Ambulatory Visit (HOSPITAL_COMMUNITY)
Admission: RE | Admit: 2021-03-27 | Discharge: 2021-03-27 | Disposition: A | Discharge status: Home / Self Care | Payer: Medicare Other | Source: Ambulatory Visit | Attending: Nephrology | Admitting: Nephrology

## 2021-03-27 VITALS — BP 107/64 | HR 79 | Temp 98.2°F | Resp 18

## 2021-03-27 DIAGNOSIS — N179 Acute kidney failure, unspecified: Secondary | ICD-10-CM

## 2021-03-27 DIAGNOSIS — D631 Anemia in chronic kidney disease: Secondary | ICD-10-CM | POA: Insufficient documentation

## 2021-03-27 DIAGNOSIS — N184 Chronic kidney disease, stage 4 (severe): Secondary | ICD-10-CM | POA: Insufficient documentation

## 2021-03-27 LAB — IRON AND TIBC
Iron: 60 ug/dL (ref 45–182)
Saturation Ratios: 24 % (ref 17.9–39.5)
TIBC: 252 ug/dL (ref 250–450)
UIBC: 192 ug/dL

## 2021-03-27 LAB — POCT HEMOGLOBIN-HEMACUE: Hemoglobin: 11.4 g/dL — ABNORMAL LOW (ref 13.0–17.0)

## 2021-03-27 LAB — FERRITIN: Ferritin: 649 ng/mL — ABNORMAL HIGH (ref 24–336)

## 2021-03-27 MED ORDER — EPOETIN ALFA-EPBX 10000 UNIT/ML IJ SOLN
10000.0000 [IU] | INTRAMUSCULAR | Status: DC
  Administered 2021-03-27: 10000 [IU] via SUBCUTANEOUS

## 2021-03-27 MED ORDER — EPOETIN ALFA-EPBX 10000 UNIT/ML IJ SOLN
INTRAMUSCULAR | Status: AC
  Filled 2021-03-27: qty 1

## 2021-04-02 ENCOUNTER — Ambulatory Visit: Payer: Medicare Other | Admitting: Family Medicine

## 2021-04-02 DIAGNOSIS — E1122 Type 2 diabetes mellitus with diabetic chronic kidney disease: Secondary | ICD-10-CM

## 2021-04-16 ENCOUNTER — Other Ambulatory Visit: Payer: Self-pay | Admitting: Family Medicine

## 2021-04-16 DIAGNOSIS — E1122 Type 2 diabetes mellitus with diabetic chronic kidney disease: Secondary | ICD-10-CM

## 2021-04-17 ENCOUNTER — Encounter (HOSPITAL_COMMUNITY): Payer: Self-pay

## 2021-04-24 ENCOUNTER — Ambulatory Visit (HOSPITAL_COMMUNITY)
Admission: RE | Admit: 2021-04-24 | Discharge: 2021-04-24 | Disposition: A | Discharge status: Home / Self Care | Payer: Medicare Other | Source: Ambulatory Visit | Attending: Nephrology | Admitting: Nephrology

## 2021-04-24 VITALS — BP 138/73 | HR 86 | Temp 98.0°F | Resp 19

## 2021-04-24 DIAGNOSIS — N179 Acute kidney failure, unspecified: Secondary | ICD-10-CM

## 2021-04-24 DIAGNOSIS — D631 Anemia in chronic kidney disease: Secondary | ICD-10-CM | POA: Insufficient documentation

## 2021-04-24 DIAGNOSIS — N184 Chronic kidney disease, stage 4 (severe): Secondary | ICD-10-CM | POA: Diagnosis present

## 2021-04-24 LAB — IRON AND TIBC
Iron: 49 ug/dL (ref 45–182)
Saturation Ratios: 20 % (ref 17.9–39.5)
TIBC: 242 ug/dL — ABNORMAL LOW (ref 250–450)
UIBC: 193 ug/dL

## 2021-04-24 LAB — RENAL FUNCTION PANEL
Albumin: 3.6 g/dL (ref 3.5–5.0)
Anion gap: 12 (ref 5–15)
BUN: 60 mg/dL — ABNORMAL HIGH (ref 8–23)
CO2: 21 mmol/L — ABNORMAL LOW (ref 22–32)
Calcium: 8.7 mg/dL — ABNORMAL LOW (ref 8.9–10.3)
Chloride: 103 mmol/L (ref 98–111)
Creatinine, Ser: 3.51 mg/dL — ABNORMAL HIGH (ref 0.61–1.24)
GFR, Estimated: 18 mL/min — ABNORMAL LOW (ref >60)
Glucose, Bld: 296 mg/dL — ABNORMAL HIGH (ref 70–99)
Phosphorus: 4.7 mg/dL — ABNORMAL HIGH (ref 2.5–4.6)
Potassium: 4.1 mmol/L (ref 3.5–5.1)
Sodium: 136 mmol/L (ref 135–145)

## 2021-04-24 LAB — FERRITIN: Ferritin: 753 ng/mL — ABNORMAL HIGH (ref 24–336)

## 2021-04-24 LAB — MAGNESIUM: Magnesium: 2.2 mg/dL (ref 1.7–2.4)

## 2021-04-24 LAB — POCT HEMOGLOBIN-HEMACUE: Hemoglobin: 11.5 g/dL — ABNORMAL LOW (ref 13.0–17.0)

## 2021-04-24 MED ORDER — EPOETIN ALFA-EPBX 10000 UNIT/ML IJ SOLN
INTRAMUSCULAR | Status: AC
  Filled 2021-04-24: qty 1

## 2021-04-24 MED ORDER — EPOETIN ALFA-EPBX 10000 UNIT/ML IJ SOLN
10000.0000 [IU] | INTRAMUSCULAR | Status: DC
  Administered 2021-04-24: 10000 [IU] via SUBCUTANEOUS

## 2021-04-26 ENCOUNTER — Other Ambulatory Visit: Payer: Self-pay | Admitting: Physician Assistant

## 2021-05-18 ENCOUNTER — Other Ambulatory Visit: Payer: Self-pay

## 2021-05-18 DIAGNOSIS — N179 Acute kidney failure, unspecified: Secondary | ICD-10-CM

## 2021-05-22 ENCOUNTER — Ambulatory Visit (HOSPITAL_COMMUNITY)
Admission: RE | Admit: 2021-05-22 | Discharge: 2021-05-22 | Disposition: A | Discharge status: Home / Self Care | Payer: Medicare Other | Source: Ambulatory Visit | Attending: Nephrology | Admitting: Nephrology

## 2021-05-22 ENCOUNTER — Other Ambulatory Visit: Payer: Self-pay

## 2021-05-22 VITALS — BP 125/64 | HR 79 | Temp 96.9°F

## 2021-05-22 DIAGNOSIS — N184 Chronic kidney disease, stage 4 (severe): Secondary | ICD-10-CM | POA: Insufficient documentation

## 2021-05-22 DIAGNOSIS — D631 Anemia in chronic kidney disease: Secondary | ICD-10-CM | POA: Insufficient documentation

## 2021-05-22 DIAGNOSIS — N179 Acute kidney failure, unspecified: Secondary | ICD-10-CM

## 2021-05-22 LAB — IRON AND TIBC
Iron: 60 ug/dL (ref 45–182)
Saturation Ratios: 24 % (ref 17.9–39.5)
TIBC: 248 ug/dL — ABNORMAL LOW (ref 250–450)
UIBC: 188 ug/dL

## 2021-05-22 LAB — POCT HEMOGLOBIN-HEMACUE: Hemoglobin: 10.8 g/dL — ABNORMAL LOW (ref 13.0–17.0)

## 2021-05-22 LAB — FERRITIN: Ferritin: 600 ng/mL — ABNORMAL HIGH (ref 24–336)

## 2021-05-22 MED ORDER — EPOETIN ALFA-EPBX 10000 UNIT/ML IJ SOLN
10000.0000 [IU] | INTRAMUSCULAR | Status: DC
  Administered 2021-05-22: 10000 [IU] via SUBCUTANEOUS

## 2021-05-22 MED ORDER — EPOETIN ALFA-EPBX 10000 UNIT/ML IJ SOLN
INTRAMUSCULAR | Status: AC
  Filled 2021-05-22: qty 1

## 2021-05-25 ENCOUNTER — Other Ambulatory Visit: Payer: Self-pay | Admitting: Family Medicine

## 2021-05-25 DIAGNOSIS — F32A Depression, unspecified: Secondary | ICD-10-CM

## 2021-05-26 ENCOUNTER — Other Ambulatory Visit: Payer: Self-pay

## 2021-05-26 ENCOUNTER — Encounter (HOSPITAL_COMMUNITY): Payer: Self-pay | Admitting: Internal Medicine

## 2021-05-26 ENCOUNTER — Ambulatory Visit (HOSPITAL_COMMUNITY)
Admission: RE | Admit: 2021-05-26 | Discharge: 2021-05-26 | Disposition: A | Discharge status: Home / Self Care | Payer: Medicare Other | Source: Ambulatory Visit | Attending: Internal Medicine | Admitting: Internal Medicine

## 2021-05-26 VITALS — BP 110/60 | HR 70 | Wt 133.6 lb

## 2021-05-26 DIAGNOSIS — I4892 Unspecified atrial flutter: Secondary | ICD-10-CM

## 2021-05-26 DIAGNOSIS — I480 Paroxysmal atrial fibrillation: Secondary | ICD-10-CM | POA: Diagnosis not present

## 2021-05-26 DIAGNOSIS — Z87891 Personal history of nicotine dependence: Secondary | ICD-10-CM | POA: Insufficient documentation

## 2021-05-26 DIAGNOSIS — N184 Chronic kidney disease, stage 4 (severe): Secondary | ICD-10-CM | POA: Diagnosis not present

## 2021-05-26 DIAGNOSIS — E1122 Type 2 diabetes mellitus with diabetic chronic kidney disease: Secondary | ICD-10-CM | POA: Diagnosis not present

## 2021-05-26 DIAGNOSIS — I5032 Chronic diastolic (congestive) heart failure: Secondary | ICD-10-CM | POA: Diagnosis not present

## 2021-05-26 DIAGNOSIS — Z79899 Other long term (current) drug therapy: Secondary | ICD-10-CM | POA: Diagnosis not present

## 2021-05-26 DIAGNOSIS — I130 Hypertensive heart and chronic kidney disease with heart failure and stage 1 through stage 4 chronic kidney disease, or unspecified chronic kidney disease: Secondary | ICD-10-CM | POA: Diagnosis not present

## 2021-05-26 DIAGNOSIS — I484 Atypical atrial flutter: Secondary | ICD-10-CM | POA: Diagnosis not present

## 2021-05-26 DIAGNOSIS — I425 Other restrictive cardiomyopathy: Secondary | ICD-10-CM | POA: Diagnosis not present

## 2021-05-26 DIAGNOSIS — Z7984 Long term (current) use of oral hypoglycemic drugs: Secondary | ICD-10-CM | POA: Diagnosis not present

## 2021-05-26 DIAGNOSIS — I2510 Atherosclerotic heart disease of native coronary artery without angina pectoris: Secondary | ICD-10-CM | POA: Diagnosis not present

## 2021-05-26 DIAGNOSIS — Z7901 Long term (current) use of anticoagulants: Secondary | ICD-10-CM | POA: Insufficient documentation

## 2021-05-26 DIAGNOSIS — I10 Essential (primary) hypertension: Secondary | ICD-10-CM | POA: Diagnosis not present

## 2021-05-26 DIAGNOSIS — D509 Iron deficiency anemia, unspecified: Secondary | ICD-10-CM | POA: Diagnosis not present

## 2021-05-26 NOTE — Patient Instructions (Signed)
Your physician has requested that you have an echocardiogram. Echocardiography is a painless test that uses sound waves to create images of your heart. It provides your doctor with information about the size and shape of your heart and how well your heart's chambers and valves are working. This procedure takes approximately one hour. There are no restrictions for this procedure.  Your physician recommends that you schedule a follow-up appointment in: Dr Margaretann Loveless in 6 months  Your physician recommends that you schedule a follow-up appointment with Dr Haroldine Laws in 1 year, **PLEASE CALL OUR OFFICE IN September TO SCHEDULE THIS APPOINTMENT.  If you have any questions or concerns before your next appointment please send [place] a message through [place] or call our office at [phone].    TO LEAVE A MESSAGE FOR THE NURSE SELECT OPTION 2, PLEASE LEAVE A MESSAGE INCLUDING: YOUR NAME DATE OF BIRTH CALL BACK NUMBER REASON FOR CALL**this is important as we prioritize the call backs  YOU WILL RECEIVE A CALL BACK THE SAME DAY AS LONG AS YOU CALL BEFORE 4:00 PM

## 2021-05-26 NOTE — Progress Notes (Signed)
ADVANCED HF CLINIC PROGRESS NOTE  Referring Physician: Primary Care: Merri Ray MD [place]) Primary Cardiologist: Margaretann Loveless [facility]: Dr. Haroldine Laws   HPI: Adam Reid is a 75 y.o. male from [place] with a h/o a tobacco abuse (quit 2021), HTN, DM2, CKD 4 (Cr ~0.9), diastolic HF, PAF and iron-deficiency anemia, recently referred by Dr. 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   Had [facility] 01/2016 for CP evaluation revealing mild nonobstructive CAD, LAD 20%, D2 50%, OM 20%  Had COVID in 2019. Not too many symptoms except for fatigue.   Hospitalized 8/11-91/47 for diastolic HF. Diuresed and weight down to 129 pounds. Sent home on lasix 60 daily. Echo LVEF 55-60%, RV ok. Restrictive filling pattern. There was also concern about cardiac amyloidosis given restrictive filling pattern on echo.  Multiple myeloma panel obtained and immunofixation did not demonstrate monoclonal protein spike. PYP completed and negative for TTR amyloid.   Zio 6/21 1. Sinus rhythm predominates - avg HR of 80 bpm. 2. Two runs NSVT - the longest lasting 6 beats with an avg rate of 124 bpm. 3. 99 Supraventricular Tachycardia/atrial tach runs occurred the longest lasting 45.5 secs with an avg rate of 105 bpm. 4. No evidence of atrial fib 5. Rare PVCs and PACs.  6. Several patient-triggered events associated with sinus rhythm    In 6/22 had capsule endoscopy which showed several small bowel AVMs. Previous EGD and colon ok.    He presents back to clinic for f/u. Here with his wife who serves as a Optometrist. Feels ok. Able to do all ADLs and grocery shopping with no problem. Not doing as much walking as before. No CP, SOB, PND. No bleeding.    Past Medical History:  Diagnosis Date   Anemia    CHF (congestive heart failure) (HCC)    Chronic kidney disease    Depression    Diabetes mellitus without complication ([place])    Hyperlipidemia    Hypertension    Thyroid disease    was on supplement, taken  off by [name]    Current Outpatient Medications  Medication Sig Dispense Refill   amiodarone (PACERONE) 200 MG tablet Take 1 tablet (200 mg total) by mouth daily. Follow up appt required for further refills. Please call [phone] to schedule an appt 30 tablet 4   atorvastatin (LIPITOR) 80 MG tablet TAKE 1 TABLET(80 MG) BY MOUTH DAILY 90 tablet 1   blood glucose meter kit and supplies Use up to two times daily as directed  ICD10 E10.9 E11.9 1 each 0   Blood Glucose Monitoring Suppl (ONETOUCH VERIO) w/Device KIT as directed.   0   citalopram (CELEXA) 20 MG tablet TAKE 1 TABLET(20 MG) BY MOUTH DAILY 90 tablet 1   cyanocobalamin 500 MCG tablet Take 500 mcg by mouth daily. Reported on 12/02/2015     diltiazem (CARDIZEM CD) 180 MG 24 hr capsule TAKE 1 CAPSULE(180 MG) BY MOUTH DAILY 60 capsule 2   ELIQUIS 2.5 MG TABS tablet TAKE 1 TABLET(2.5 MG) BY MOUTH TWICE DAILY 180 tablet 1   FARXIGA 5 MG TABS tablet TAKE 1 TABLET(5 MG) BY MOUTH DAILY 30 tablet 5   glipiZIDE (GLUCOTROL) 5 MG tablet Take 0.5 tablets (2.5 mg total) by mouth 2 (two) times daily before a meal. Breakfast and dinner. 30 tablet 2   GlucoCom Lancets MISC Use for home glucose monitoring 100 each 3   glucose blood (ONETOUCH VERIO) test strip Test up to 2 times per day.  Uncontrolled  diabetes with hyperglycemia and stage 4 CKD. 100 each 3   pantoprazole (PROTONIX) 40 MG tablet TAKE 1 TABLET(40 MG) BY MOUTH DAILY 30 tablet 3   polyethylene glycol (MIRALAX / GLYCOLAX) 17 g packet Take 17 g by mouth daily as needed for mild constipation or moderate constipation.      senna (SENOKOT) 8.6 MG tablet Take 1 tablet (8.6 mg total) by mouth as needed for constipation.     sitaGLIPtin (JANUVIA) 25 MG tablet TAKE 1 TABLET(25 MG) BY MOUTH DAILY 90 tablet 1   tamsulosin (FLOMAX) 0.4 MG CAPS capsule TAKE 2 CAPSULES(0.8 MG) BY MOUTH DAILY 180 capsule 3   torsemide (DEMADEX) 20 MG tablet Take 1 tablet (20 mg total) by mouth as needed. 30 tablet 2    No current facility-administered medications for this encounter.    No Known Allergies    Social History   Socioeconomic History   Marital status: Married    Spouse name: [name]   Number of children: 1   Years of education: 12th grade   Highest education level: Not on file  Occupational History   Occupation: Retired-accountant  Tobacco Use   Smoking status: Light Smoker    Packs/day: 0.10    Years: 48.00    Pack years: 4.80    Types: Cigarettes    Last attempt to quit: 07/16/2019    Years since quitting: 1.8   Smokeless tobacco: Never   Tobacco comments:    per pt he has one cigarette every couple days  Vaping Use   Vaping Use: Never used  Substance and Sexual Activity   Alcohol use: No    Alcohol/week: 0.0 standard drinks   Drug use: No   Sexual activity: Never    Partners: Female  Other Topics Concern   Not on file  Social History Narrative   Originally from [place]. Came to the [place] in 2009, following their son who came here for school.   Married.   Lives with his wife.   Their adult son lives in [place], [place], where he is in optometry school.   Education: [employer]   Exercise: No   Social Determinants of Radio broadcast assistant Strain: Not on file  Food Insecurity: Not on file  Transportation Needs: Not on file  Physical Activity: Not on file  Stress: Not on file  Social Connections: Not on file  Intimate Partner Violence: Not on file      Family History  Problem Relation Age of Onset   Hypertension Mother    Hyperlipidemia Mother    Hypertension Sister    Kidney disease Father    Heart disease Brother 88       open heart surgery   Colon cancer Neg Hx     Vitals:   05/26/21 1405  BP: 110/60  Pulse: 70  SpO2: 98%  Weight: 60.6 kg (133 lb 9.6 oz)   Wt Readings from Last 3 Encounters:  05/26/21 60.6 kg (133 lb 9.6 oz)  03/02/21 59.4 kg (131 lb)  01/23/21 59.9 kg (132 lb)    General:  Thin. No resp difficulty HEENT: normal Neck:  supple. no JVD. Carotids 2+ bilat; no bruits. No lymphadenopathy or thryomegaly appreciated. Cor: PMI nondisplaced. Regular rate & rhythm. No rubs, gallops or murmurs. Lungs: clear Abdomen: soft, nontender, nondistended. No hepatosplenomegaly. No bruits or masses. Good bowel sounds. Extremities: no cyanosis, clubbing, rash, edema Neuro: alert & orientedx3, cranial nerves grossly intact. moves all 4 extremities w/o difficulty. Affect pleasant  ASSESSMENT & PLAN:  1.Chronic diastolic HF - Echo 3/73: EF 55-60% with restrictive filling pattern - PYP scan 4/21 negative for TTR amyloid  - based on history, ECG, echo findings (normal RV) and concomitant renal failure, suspect this is due to longstanding HTN +/- DM2 changes  - Much improved with maintenance of NSR. - NYHA II. Volume status ok.  - Continue Farxiga   2. 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 HTN - Blood pressure well controlled. Continue current regimen.  4. CKD Stage IV  - due to HTN and DM2 - Followed by Dr. Elmarie Shiley  - last creatinine 3.6 (GFR 18)  - Has been referred for access with VVS but not sure he wants to do HD. We discussed the fact that if renal dysfunction progresses he will not survive without HD. He will proceed  5. DM2 - per PCP - Continue Farxiga  6. IDA - Nephrology following and treating w/ IV Fe infusions and aranesp - Had EGD/colon in 5/21. 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 Non-obstructive CAD - No s/s angina - Off ASA with Eliquis  - continue statin   Glori Bickers, MD  2:34 PM

## 2021-06-09 ENCOUNTER — Ambulatory Visit (INDEPENDENT_AMBULATORY_CARE_PROVIDER_SITE_OTHER): Payer: Medicare Other | Admitting: Vascular Surgery

## 2021-06-09 ENCOUNTER — Other Ambulatory Visit: Payer: Self-pay

## 2021-06-09 ENCOUNTER — Ambulatory Visit (INDEPENDENT_AMBULATORY_CARE_PROVIDER_SITE_OTHER)
Admission: RE | Admit: 2021-06-09 | Discharge: 2021-06-09 | Disposition: A | Discharge status: Home / Self Care | Payer: Medicare Other | Source: Ambulatory Visit | Attending: Vascular Surgery | Admitting: Vascular Surgery

## 2021-06-09 ENCOUNTER — Encounter: Payer: Self-pay | Admitting: Vascular Surgery

## 2021-06-09 ENCOUNTER — Ambulatory Visit (HOSPITAL_COMMUNITY)
Admission: RE | Admit: 2021-06-09 | Discharge: 2021-06-09 | Disposition: A | Discharge status: Home / Self Care | Payer: Medicare Other | Source: Ambulatory Visit | Attending: Vascular Surgery | Admitting: Vascular Surgery

## 2021-06-09 DIAGNOSIS — N184 Chronic kidney disease, stage 4 (severe): Secondary | ICD-10-CM | POA: Diagnosis not present

## 2021-06-09 DIAGNOSIS — N179 Acute kidney failure, unspecified: Secondary | ICD-10-CM | POA: Diagnosis not present

## 2021-06-09 NOTE — Progress Notes (Signed)
Patient name: Adam Reid MRN: [id number] DOB: Dec 13, 1945 Sex: male  REASON FOR CONSULT: Evaluate permanent hemodialysis access  HPI: Adam Reid is a 75 y.o. male, with history of stage IV CKD, diabetes, hypertension that presents for evaluation of permanent hemodialysis access.  Patient is here with his wife who provides some of the history.  Sounds like he writes with his right hand but does most all other activities with his left hand.  Would consider himself left hand dominant.  No previous access in the past.  No chest wall implants.  Not on dialysis at this time.  No previous access.  Followed by Dr. Allena Katz with nephrology.  Past Medical History:  Diagnosis Date   Anemia    CHF (congestive heart failure) (HCC)    Chronic kidney disease    Depression    Diabetes mellitus without complication (HCC)    Hyperlipidemia    Hypertension    Thyroid disease    was on supplement, taken off by Dr Perrin Maltese    Past Surgical History:  Procedure Laterality Date   APPENDECTOMY     BIOPSY  12/25/2020   Procedure: BIOPSY;  Surgeon: Lemar Lofty., MD;  Location: [facility] ENDOSCOPY;  Service: Gastroenterology;;   CARDIAC CATHETERIZATION N/A 01/30/2016   Procedure: Left Heart Cath and Coronary Angiography;  Surgeon: Corky Crafts, MD;  Location: [facility] INVASIVE CV LAB;  Service: Cardiovascular;  Laterality: N/A;   ENTEROSCOPY N/A 12/25/2020   Procedure: ENTEROSCOPY;  Surgeon: Meridee Score Netty Starring., MD;  Location: [facility] ENDOSCOPY;  Service: Gastroenterology;  Laterality: N/A;   GIVENS CAPSULE STUDY N/A 12/25/2020   Procedure: GIVENS CAPSULE STUDY;  Surgeon: Lemar Lofty., MD;  Location: [facility] ENDOSCOPY;  Service: Gastroenterology;  Laterality: N/A;   SUBMUCOSAL TATTOO INJECTION  12/25/2020   Procedure: SUBMUCOSAL TATTOO INJECTION;  Surgeon: Meridee Score Netty Starring., MD;  Location: [facility] ENDOSCOPY;  Service: Gastroenterology;;    Family History  Problem Relation Age of Onset    Hypertension Mother    Hyperlipidemia Mother    Hypertension Sister    Kidney disease Father    Heart disease Brother 66       open heart surgery   Colon cancer Neg Hx     SOCIAL HISTORY: Social History   Socioeconomic History   Marital status: Married    Spouse name: [name]   Number of children: 1   Years of education: 12th grade   Highest education level: Not on file  Occupational History   Occupation: Retired-accountant  Tobacco Use   Smoking status: Light Smoker    Packs/day: 0.10    Years: 48.00    Pack years: 4.80    Types: Cigarettes    Last attempt to quit: 07/16/2019    Years since quitting: 1.9   Smokeless tobacco: Never   Tobacco comments:    per pt he has one cigarette every couple days  Vaping Use   Vaping Use: Never used  Substance and Sexual Activity   Alcohol use: No    Alcohol/week: 0.0 standard drinks   Drug use: No   Sexual activity: Never    Partners: Female  Other Topics Concern   Not on file  Social History Narrative   Originally from [place]. Came to the [place] in 2009, following their son who came here for school.   Married.   Lives with his wife.   Their adult son lives in [place], [place], where he is in optometry school.   Education: [employer]   Exercise:  No   Social Determinants of Radio broadcast assistant Strain: Not on file  Food Insecurity: Not on file  Transportation Needs: Not on file  Physical Activity: Not on file  Stress: Not on file  Social Connections: Not on file  Intimate Partner Violence: Not on file    No Known Allergies  Current Outpatient Medications  Medication Sig Dispense Refill   amiodarone (PACERONE) 200 MG tablet Take 1 tablet (200 mg total) by mouth daily. Follow up appt required for further refills. Please call [phone] to schedule an appt 30 tablet 4   atorvastatin (LIPITOR) 80 MG tablet TAKE 1 TABLET(80 MG) BY MOUTH DAILY 90 tablet 1   blood glucose meter kit and supplies Use up to two times daily as  directed  ICD10 E10.9 E11.9 1 each 0   Blood Glucose Monitoring Suppl (ONETOUCH VERIO) w/Device KIT as directed.   0   citalopram (CELEXA) 20 MG tablet TAKE 1 TABLET(20 MG) BY MOUTH DAILY 90 tablet 1   cyanocobalamin 500 MCG tablet Take 500 mcg by mouth daily. Reported on 12/02/2015     diltiazem (CARDIZEM CD) 180 MG 24 hr capsule TAKE 1 CAPSULE(180 MG) BY MOUTH DAILY 60 capsule 2   ELIQUIS 2.5 MG TABS tablet TAKE 1 TABLET(2.5 MG) BY MOUTH TWICE DAILY 180 tablet 1   FARXIGA 5 MG TABS tablet TAKE 1 TABLET(5 MG) BY MOUTH DAILY 30 tablet 5   glipiZIDE (GLUCOTROL) 5 MG tablet Take 0.5 tablets (2.5 mg total) by mouth 2 (two) times daily before a meal. Breakfast and dinner. 30 tablet 2   GlucoCom Lancets MISC Use for home glucose monitoring 100 each 3   glucose blood (ONETOUCH VERIO) test strip Test up to 2 times per day.  Uncontrolled diabetes with hyperglycemia and stage 4 CKD. 100 each 3   pantoprazole (PROTONIX) 40 MG tablet TAKE 1 TABLET(40 MG) BY MOUTH DAILY 30 tablet 3   polyethylene glycol (MIRALAX / GLYCOLAX) 17 g packet Take 17 g by mouth daily as needed for mild constipation or moderate constipation.      senna (SENOKOT) 8.6 MG tablet Take 1 tablet (8.6 mg total) by mouth as needed for constipation.     sitaGLIPtin (JANUVIA) 25 MG tablet TAKE 1 TABLET(25 MG) BY MOUTH DAILY 90 tablet 1   tamsulosin (FLOMAX) 0.4 MG CAPS capsule TAKE 2 CAPSULES(0.8 MG) BY MOUTH DAILY 180 capsule 3   torsemide (DEMADEX) 20 MG tablet Take 1 tablet (20 mg total) by mouth as needed. 30 tablet 2   No current facility-administered medications for this visit.    REVIEW OF SYSTEMS:  $RemoveB'[X]'QgTWnTyZ$  denotes positive finding, $RemoveBeforeDEI'[ ]'fAYhMqOSOoPoNmaZ$  denotes negative finding Cardiac  Comments:  Chest pain or chest pressure:    Shortness of breath upon exertion:    Short of breath when lying flat:    Irregular heart rhythm:        Vascular    Pain in calf, thigh, or hip brought on by ambulation:    Pain in feet at night that wakes you up from  your sleep:     Blood clot in your veins:    Leg swelling:         Pulmonary    Oxygen at home:    Productive cough:     Wheezing:         Neurologic    Sudden weakness in arms or legs:     Sudden numbness in arms or legs:     Sudden onset of  difficulty speaking or slurred speech:    Temporary loss of vision in one eye:     Problems with dizziness:         Gastrointestinal    Blood in stool:     Vomited blood:         Genitourinary    Burning when urinating:     Blood in urine:        Psychiatric    Major depression:         Hematologic    Bleeding problems:    Problems with blood clotting too easily:        Skin    Rashes or ulcers:        Constitutional    Fever or chills:      PHYSICAL EXAM: Vitals:   06/09/21 1208  BP: (!) 157/74  Pulse: 74  Resp: 16  Temp: 98.6 F (37 C)  TempSrc: Temporal  SpO2: 96%  Weight: 135 lb (61.2 kg)  Height: $Remove'5\' 6"'FgBiqyL$  (1.676 m)    GENERAL: The patient is a well-nourished male, in no acute distress. The vital signs are documented above. CARDIAC: There is a regular rate and rhythm.  VASCULAR:  Palpable radial and brachial pulses bilateral upper extremities PULMONARY: No respiratory distress. ABDOMEN: Soft and non-tender. MUSCULOSKELETAL: There are no major deformities or cyanosis. NEUROLOGIC: No focal weakness or paresthesias are detected. SKIN: There are no ulcers or rashes noted. PSYCHIATRIC: The patient has a normal affect.  DATA:   Arterial duplex shows triphasic waveforms in both upper extremities.  Upper extremity vein mapping shows a cephalic vein that is small in the upper arm and a better basilic vein in the right arm (non-dominant arm)  Assessment/Plan:  75 year old male with stage IV chronic kidney disease that presents for permanent dialysis access evaluation.  Patient would consider himself left hand dominant so we suggested placement in the right arm.  He does have a better appearing basilic vein based on  his vein mapping (compared to the cephalic vein).  I discussed if we use the basilic vein this would be done in two stages.  Certainly would reevaluate the cephalic vein with ultrasound in the OR.  Discussed this being outpatient surgery at [facility] health.  Discussed taking up to 3 months for access to mature after placement.  Discussed risk of bleeding, infection, failure to mature, as well as steal syndrome along with other risks.  Offered to get him scheduled today and he would like to schedule in several months after the holidays.  We will go ahead and pick a date.  All questions answered.   [name], MD [facility] Office: [phone]

## 2021-06-13 ENCOUNTER — Other Ambulatory Visit: Payer: Self-pay | Admitting: Family Medicine

## 2021-06-13 DIAGNOSIS — Z8639 Personal history of other endocrine, nutritional and metabolic disease: Secondary | ICD-10-CM

## 2021-06-13 DIAGNOSIS — E1122 Type 2 diabetes mellitus with diabetic chronic kidney disease: Secondary | ICD-10-CM

## 2021-06-16 NOTE — Telephone Encounter (Signed)
Patients wife called back about the glipizide and said that he is almost out of his medication.  Please advise.

## 2021-06-19 ENCOUNTER — Other Ambulatory Visit: Payer: Self-pay

## 2021-06-19 ENCOUNTER — Ambulatory Visit (HOSPITAL_COMMUNITY)
Admission: RE | Admit: 2021-06-19 | Discharge: 2021-06-19 | Disposition: A | Discharge status: Home / Self Care | Payer: Medicare Other | Source: Ambulatory Visit | Attending: Nephrology | Admitting: Nephrology

## 2021-06-19 VITALS — BP 128/69 | HR 76 | Temp 98.0°F | Resp 19

## 2021-06-19 DIAGNOSIS — D631 Anemia in chronic kidney disease: Secondary | ICD-10-CM | POA: Insufficient documentation

## 2021-06-19 DIAGNOSIS — N184 Chronic kidney disease, stage 4 (severe): Secondary | ICD-10-CM | POA: Insufficient documentation

## 2021-06-19 DIAGNOSIS — N179 Acute kidney failure, unspecified: Secondary | ICD-10-CM

## 2021-06-19 LAB — IRON AND TIBC
Iron: 55 ug/dL (ref 45–182)
Saturation Ratios: 21 % (ref 17.9–39.5)
TIBC: 266 ug/dL (ref 250–450)
UIBC: 211 ug/dL

## 2021-06-19 LAB — FERRITIN: Ferritin: 538 ng/mL — ABNORMAL HIGH (ref 24–336)

## 2021-06-19 LAB — POCT HEMOGLOBIN-HEMACUE: Hemoglobin: 10.7 g/dL — ABNORMAL LOW (ref 13.0–17.0)

## 2021-06-19 MED ORDER — EPOETIN ALFA-EPBX 10000 UNIT/ML IJ SOLN
INTRAMUSCULAR | Status: AC
  Filled 2021-06-19: qty 1

## 2021-06-19 MED ORDER — EPOETIN ALFA-EPBX 10000 UNIT/ML IJ SOLN
10000.0000 [IU] | INTRAMUSCULAR | Status: DC
  Administered 2021-06-19: 10000 [IU] via SUBCUTANEOUS

## 2021-07-14 ENCOUNTER — Ambulatory Visit (HOSPITAL_COMMUNITY): Payer: Medicare Other

## 2021-07-15 ENCOUNTER — Other Ambulatory Visit: Payer: Self-pay

## 2021-07-15 ENCOUNTER — Ambulatory Visit (HOSPITAL_COMMUNITY)
Admission: RE | Admit: 2021-07-15 | Discharge: 2021-07-15 | Disposition: A | Discharge status: Home / Self Care | Payer: 59 | Source: Ambulatory Visit | Attending: Internal Medicine | Admitting: Internal Medicine

## 2021-07-15 ENCOUNTER — Encounter (HOSPITAL_COMMUNITY): Payer: Self-pay

## 2021-07-15 DIAGNOSIS — I5032 Chronic diastolic (congestive) heart failure: Secondary | ICD-10-CM | POA: Diagnosis present

## 2021-07-15 DIAGNOSIS — I110 Hypertensive heart disease with heart failure: Secondary | ICD-10-CM | POA: Insufficient documentation

## 2021-07-15 DIAGNOSIS — E119 Type 2 diabetes mellitus without complications: Secondary | ICD-10-CM | POA: Insufficient documentation

## 2021-07-15 DIAGNOSIS — E785 Hyperlipidemia, unspecified: Secondary | ICD-10-CM | POA: Insufficient documentation

## 2021-07-15 LAB — ECHOCARDIOGRAM COMPLETE
Area-P 1/2: 5.27 cm2
MV M vel: 5.52 m/s
MV Peak grad: 121.9 mmHg
S' Lateral: 3 cm

## 2021-07-17 ENCOUNTER — Encounter (HOSPITAL_COMMUNITY)
Admission: RE | Admit: 2021-07-17 | Discharge: 2021-07-17 | Disposition: A | Discharge status: Home / Self Care | Payer: Medicare Other | Source: Ambulatory Visit | Attending: Nephrology | Admitting: Nephrology

## 2021-07-17 ENCOUNTER — Other Ambulatory Visit: Payer: Self-pay

## 2021-07-17 VITALS — BP 152/75 | HR 87 | Temp 98.2°F | Resp 18

## 2021-07-17 DIAGNOSIS — N179 Acute kidney failure, unspecified: Secondary | ICD-10-CM

## 2021-07-17 DIAGNOSIS — N184 Chronic kidney disease, stage 4 (severe): Secondary | ICD-10-CM | POA: Diagnosis present

## 2021-07-17 DIAGNOSIS — D631 Anemia in chronic kidney disease: Secondary | ICD-10-CM | POA: Diagnosis not present

## 2021-07-17 LAB — FERRITIN: Ferritin: 733 ng/mL — ABNORMAL HIGH (ref 24–336)

## 2021-07-17 LAB — POCT HEMOGLOBIN-HEMACUE: Hemoglobin: 11 g/dL — ABNORMAL LOW (ref 13.0–17.0)

## 2021-07-17 LAB — IRON AND TIBC
Iron: 38 ug/dL — ABNORMAL LOW (ref 45–182)
Saturation Ratios: 15 % — ABNORMAL LOW (ref 17.9–39.5)
TIBC: 256 ug/dL (ref 250–450)
UIBC: 218 ug/dL

## 2021-07-17 MED ORDER — EPOETIN ALFA-EPBX 10000 UNIT/ML IJ SOLN
10000.0000 [IU] | INTRAMUSCULAR | Status: DC
  Administered 2021-07-17: 10000 [IU] via SUBCUTANEOUS

## 2021-07-17 MED ORDER — EPOETIN ALFA-EPBX 10000 UNIT/ML IJ SOLN
INTRAMUSCULAR | Status: AC
  Filled 2021-07-17: qty 1

## 2021-07-24 ENCOUNTER — Encounter (HOSPITAL_COMMUNITY): Payer: Self-pay

## 2021-08-07 ENCOUNTER — Encounter (HOSPITAL_COMMUNITY): Payer: Self-pay

## 2021-08-14 ENCOUNTER — Ambulatory Visit (HOSPITAL_COMMUNITY)
Admission: RE | Admit: 2021-08-14 | Discharge: 2021-08-14 | Disposition: A | Discharge status: Home / Self Care | Payer: Medicare Other | Source: Ambulatory Visit | Attending: Nephrology | Admitting: Nephrology

## 2021-08-14 ENCOUNTER — Other Ambulatory Visit: Payer: Self-pay

## 2021-08-14 VITALS — BP 145/87 | HR 70

## 2021-08-14 DIAGNOSIS — D631 Anemia in chronic kidney disease: Secondary | ICD-10-CM | POA: Insufficient documentation

## 2021-08-14 DIAGNOSIS — N184 Chronic kidney disease, stage 4 (severe): Secondary | ICD-10-CM | POA: Diagnosis present

## 2021-08-14 DIAGNOSIS — N179 Acute kidney failure, unspecified: Secondary | ICD-10-CM

## 2021-08-14 LAB — RENAL FUNCTION PANEL
Albumin: 3.2 g/dL — ABNORMAL LOW (ref 3.5–5.0)
Anion gap: 11 (ref 5–15)
BUN: 61 mg/dL — ABNORMAL HIGH (ref 8–23)
CO2: 20 mmol/L — ABNORMAL LOW (ref 22–32)
Calcium: 8.5 mg/dL — ABNORMAL LOW (ref 8.9–10.3)
Chloride: 105 mmol/L (ref 98–111)
Creatinine, Ser: 3.69 mg/dL — ABNORMAL HIGH (ref 0.61–1.24)
GFR, Estimated: 16 mL/min — ABNORMAL LOW (ref >60)
Glucose, Bld: 210 mg/dL — ABNORMAL HIGH (ref 70–99)
Phosphorus: 5.2 mg/dL — ABNORMAL HIGH (ref 2.5–4.6)
Potassium: 3.9 mmol/L (ref 3.5–5.1)
Sodium: 136 mmol/L (ref 135–145)

## 2021-08-14 LAB — IRON AND TIBC
Iron: 33 ug/dL — ABNORMAL LOW (ref 45–182)
Saturation Ratios: 14 % — ABNORMAL LOW (ref 17.9–39.5)
TIBC: 228 ug/dL — ABNORMAL LOW (ref 250–450)
UIBC: 195 ug/dL

## 2021-08-14 LAB — POCT HEMOGLOBIN-HEMACUE: Hemoglobin: 9.5 g/dL — ABNORMAL LOW (ref 13.0–17.0)

## 2021-08-14 LAB — FERRITIN: Ferritin: 730 ng/mL — ABNORMAL HIGH (ref 24–336)

## 2021-08-14 LAB — MAGNESIUM: Magnesium: 2.1 mg/dL (ref 1.7–2.4)

## 2021-08-14 MED ORDER — EPOETIN ALFA-EPBX 10000 UNIT/ML IJ SOLN
INTRAMUSCULAR | Status: AC
  Filled 2021-08-14: qty 1

## 2021-08-14 MED ORDER — EPOETIN ALFA-EPBX 10000 UNIT/ML IJ SOLN
10000.0000 [IU] | INTRAMUSCULAR | Status: DC
  Administered 2021-08-14: 10000 [IU] via SUBCUTANEOUS

## 2021-08-21 ENCOUNTER — Other Ambulatory Visit: Payer: Self-pay

## 2021-08-21 ENCOUNTER — Telehealth: Payer: Self-pay | Admitting: Family Medicine

## 2021-08-21 DIAGNOSIS — E1122 Type 2 diabetes mellitus with diabetic chronic kidney disease: Secondary | ICD-10-CM

## 2021-08-21 DIAGNOSIS — Z8639 Personal history of other endocrine, nutritional and metabolic disease: Secondary | ICD-10-CM

## 2021-08-21 MED ORDER — GLIPIZIDE 5 MG PO TABS: ORAL_TABLET | ORAL | 2 refills | Status: DC

## 2021-08-21 NOTE — Telephone Encounter (Signed)
Pt's wife called in asking for a refill on the Glipizide, she states that pt is taking 1 and a 1/2 tablets a day. Pt uses [facility] on [address] in [place]

## 2021-08-22 ENCOUNTER — Other Ambulatory Visit (HOSPITAL_COMMUNITY): Payer: Self-pay | Admitting: Internal Medicine

## 2021-08-26 ENCOUNTER — Ambulatory Visit: Payer: Medicaid Other | Admitting: Family Medicine

## 2021-08-27 ENCOUNTER — Ambulatory Visit (INDEPENDENT_AMBULATORY_CARE_PROVIDER_SITE_OTHER): Payer: Medicare Other | Admitting: Family Medicine

## 2021-08-27 ENCOUNTER — Encounter: Payer: Self-pay | Admitting: Family Medicine

## 2021-08-27 VITALS — BP 132/68 | HR 75 | Temp 98.3°F | Resp 16 | Ht 66.0 in | Wt 123.0 lb

## 2021-08-27 DIAGNOSIS — F339 Major depressive disorder, recurrent, unspecified: Secondary | ICD-10-CM | POA: Diagnosis not present

## 2021-08-27 DIAGNOSIS — R052 Subacute cough: Secondary | ICD-10-CM

## 2021-08-27 DIAGNOSIS — I5032 Chronic diastolic (congestive) heart failure: Secondary | ICD-10-CM | POA: Diagnosis not present

## 2021-08-27 DIAGNOSIS — E1122 Type 2 diabetes mellitus with diabetic chronic kidney disease: Secondary | ICD-10-CM

## 2021-08-27 NOTE — Patient Instructions (Addendum)
Chest xray at [facility].  If chest xray looks ok, try flonase nasal spray. If cough not continuing to improve in next week - return for recheck.  I will check diabetes test and let you know if changes needed.  Discuss the weight with nephrology, but if they do not think it is due to your kidney disease, follow up with me so we can look at other causes.  See information on depression. I would consider meeting with a counselor. Medication not increased as possible interaction with another one of your meds. Recheck 1 month. Return to the clinic or go to the nearest emergency room if any of your symptoms worsen or new symptoms occur.  Managing Depression, Adult Depression is a mental health condition that affects your thoughts, feelings, and actions. Being diagnosed with depression can bring you relief if you did not know why you have felt or behaved a certain way. It could also leave you feeling overwhelmed with uncertainty about your future. Preparing yourself to manage your symptoms can help you feel more positive about your future. How to manage lifestyle changes Managing stress Stress is your body's reaction to life changes and events, both good and bad. Stress can add to your feelings of depression. Learning to manage your stress can help lessen your feelings of depression. Try some of the following approaches to reducing your stress (stress reduction techniques): Listen to music that you enjoy and that inspires you. Try using a meditation app or take a meditation class. Develop a practice that helps you connect with your spiritual self. Walk in nature, pray, or go to a place of worship. Do some deep breathing. To do this, inhale slowly through your nose. Pause at the top of your inhale for a few seconds and then exhale slowly, letting your muscles relax. Practice yoga to help relax and work your muscles. Choose a stress reduction technique that suits your lifestyle and personality. These  techniques take time and practice to develop. Set aside 5-15 minutes a day to do them. Therapists can offer training in these techniques. Other things you can do to manage stress include: Keeping a stress diary. Knowing your limits and saying no when you think something is too much. Paying attention to how you react to certain situations. You may not be able to control everything, but you can change your reaction. Adding humor to your life by watching funny films or TV shows. Making time for activities that you enjoy and that relax you.  Medicines Medicines, such as antidepressants, are often a part of treatment for depression. Talk with your pharmacist or health care provider about all the medicines, supplements, and herbal products that you take, their possible side effects, and what medicines and other products are safe to take together. Make sure to report any side effects you may have to your health care provider. Relationships Your health care provider may suggest family therapy, couples therapy, or individual therapy as part of your treatment. How to recognize changes Everyone responds differently to treatment for depression. As you recover from depression, you may start to: Have more interest in doing activities. Feel less hopeless. Have more energy. Overeat less often, or have a better appetite. Have better mental focus. It is important to recognize if your depression is not getting better or is getting worse. The symptoms you had in the beginning may return, such as: Tiredness (fatigue) or low energy. Eating too much or too little. Sleeping too much or too little. Feeling  restless, agitated, or hopeless. Trouble focusing or making decisions. Unexplained physical complaints. Feeling irritable, angry, or aggressive. If you or your family members notice these symptoms coming back, let your health care provider know right away. Follow these instructions at home: Activity  Try to  get some form of exercise each day, such as walking, biking, swimming, or lifting weights. Practice stress reduction techniques. Engage your mind by taking a class or doing some volunteer work. Lifestyle Get the right amount and quality of sleep. Cut down on using caffeine, tobacco, alcohol, and other potentially harmful substances. Eat a healthy diet that includes plenty of vegetables, fruits, whole grains, low-fat dairy products, and lean protein. Do not eat a lot of foods that are high in solid fats, added sugars, or salt (sodium). General instructions Take over-the-counter and prescription medicines only as told by your health care provider. Keep all follow-up visits as told by your health care provider. This is important. Where to find support Talking to others Friends and family members can be sources of support and guidance. Talk to trusted friends or family members about your condition. Explain your symptoms to them, and let them know that you are working with a health care provider to treat your depression. Tell friends and family members how they also can be helpful. Finances Find appropriate mental health providers that fit with your financial situation. Talk with your health care provider about options to get reduced prices on your medicines. Where to find more information You can find support in your area from: Anxiety and Depression Association of America (ADAA): www.adaa.org Mental Health America: www.mentalhealthamerica.net [employer] on Mental Illness: www.nami.org Contact a health care provider if: You stop taking your antidepressant medicines, and you have any of these symptoms: Nausea. Headache. Light-headedness. Chills and body aches. Not being able to sleep (insomnia). You or your friends and family think your depression is getting worse. Get help right away if: You have thoughts of hurting yourself or others. If you ever feel like you may hurt yourself or  others, or have thoughts about taking your own life, get help right away. Go to your nearest emergency department or: Call your local emergency services (911 in the [place] Call a suicide crisis helpline, such as the [place] at 513-207-9964 or 988 in the [place]. This is open 24 hours a day in the [place] Text the Crisis Text Line at 928-080-6660 (in the [place].). Summary If you are diagnosed with depression, preparing yourself to manage your symptoms is a good way to feel positive about your future. Work with your health care provider on a management plan that includes stress reduction techniques, medicines (if applicable), therapy, and healthy lifestyle habits. Keep talking with your health care provider about how your treatment is working. If you have thoughts about taking your own life, call a suicide crisis helpline or text a crisis text line. This information is not intended to replace advice given to you by your health care provider. Make sure you discuss any questions you have with your health care provider. Document Revised: 01/21/2021 Document Reviewed: 05/09/2019 Elsevier Patient Education  2022 [employer].

## 2021-08-27 NOTE — Progress Notes (Signed)
Subjective:  Patient ID: Adam Reid, male    DOB: 1946-06-03  Age: 76 y.o. MRN: [id number]  CC:  Chief Complaint  Patient presents with   Cough    Has been coughing a lot and would like to get a chest x-ray, cough has been going on for about a month, no other sxs with the cough    Diabetes    He was suppose to come back for a follow up in September about this and did not due for A1c   Depression    PHQ9 score 16    HPI Adam Reid presents for  Follow-up of above, presents with wife, interpreting and provides some history.   Cough: Past month or so.  Episodic, usually daily. Better past 2 days.  Neg covid test x 2.  No fever, some dyspnea initially - better now.  Had been off torsemide - restarted - still some cough.  No chest pain. Hgb up to 11.5 recently.  Min nasal congestion, no PND.   Has a history of chronic diastolic CHF, followed by Dr. Haroldine Laws. Echo 05/26/2021.  EF 55 to 60%.  Small bilateral pleural effusions noted in December 2021 which were improving from previous reading.  No recent x-ray.   Depression: Positive depression screening score in August.  Same score today. Celexa 43m qd.  Had restarted meds prior to his June visit.  Improving at that time No suicidal thoughts. Sometimes feels down/depressed. Taking celexa QD. Denies specific stressor, but did have appt setup for fistula to prepare for possible hemodialysis.  Declines counseling.  Possible QT prolongation with celexa and amiodarone.   Depression screen Adam Reid 08/27/2021 03/02/2021 12/22/2020 06/19/2020 04/04/2020  Decreased Interest 3 2 0 0 0  Down, Depressed, Hopeless 2 1 0 0 0  PHQ - 2 Score 5 3 0 0 0  Altered sleeping _0 - 1  Tired, decreased energy 2 3 0 - 1  Change in appetite 2 1 0 - 0  Feeling bad or failure about yourself  2 1 0 - 0  Trouble concentrating 1 1 0 - 0  Moving slowly or fidgety/restless 2 2 0 - 0  Suicidal thoughts - 3 0 - 0  PHQ-9 Score _1 - 2   Difficult doing work/chores - Somewhat difficult - - -  Some recent data might be hidden     Diabetes: With chronic kidney disease, followed by nephrology..  Last office visit August.  A1c had been well controlled in June.  Glipizide, FWilder Gladepreviously held, continue on Januvia.  Was taking Januvia 25 mg, glipizide 5 mg daily with history of hypoglycemia we adjusted his glipizide to a short acting with 2.5 mg of breakfast, and dinner his 2 largest meals.  Continued on Januvia 25 mg. 1 month appointment recommended. Still taking glipizide 2.5 mg twice daily, Januvia 25 mg daily.  He is also on Farxiga 5 mg daily. Home readings: 160 in the morning, 2hr - close to 200.  Symptomatic lows - once 1 month ago around 50 - not recent, not skipping meals.   Wt Readings from Last 3 Encounters:  08/27/21 123 lb (55.8 kg)  06/09/21 135 lb (61.2 kg)  05/26/21 133 lb 9.6 oz (60.6 kg)    Lab Results  Component Value Date   HGBA1C 6.0 (A) 12/22/2020   HGBA1C 6.7 (A) 06/19/2020   HGBA1C 7.3 (H) 03/07/2020   Lab Results  Component Value Date   MICROALBUR 60.3 09/02/2015  [place] 48 12/22/2020   CREATININE 3.69 (H) 08/14/2021    History Patient Active Problem List   Diagnosis Date Noted   Chronic kidney disease (CKD), stage IV (severe) ([place]) 06/09/2021   Symptomatic anemia 12/23/2020   GI bleed 12/23/2020   Paroxysmal A-fib (HCC) 12/23/2020   Chronic diastolic CHF (congestive heart failure) ([place]) 12/23/2020   Acute CHF (congestive heart failure) ([place]) 09/24/2019   ARF (acute renal failure) ([place]) 09/24/2019   Anemia of chronic disease 09/24/2019   Acute respiratory failure with hypoxia (HCC)    Secondary hyperparathyroidism, renal ([place]) 12/29/2016   Smoker 03/26/2016   Hypotension 01/29/2016   Abnormal myocardial perfusion study 01/29/2016   Depression 12/09/2015   Hyperlipidemia LDL goal <100 06/28/2014   Diabetes mellitus with renal manifestations, controlled ([place])    HTN  (hypertension) 06/24/2014   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[place]) 06/24/2014   BPH (benign prostatic hyperplasia) 06/24/2014   Past Medical History:  Diagnosis Date   Anemia    CHF (congestive heart failure) (HCC)    Chronic kidney disease    Depression    Diabetes mellitus without complication ([place])    Hyperlipidemia    Hypertension    Thyroid disease    was on supplement, taken off by [name]   Past Surgical History:  Procedure Laterality Date   APPENDECTOMY     BIOPSY  12/25/2020   Procedure: BIOPSY;  Surgeon: [name]., MD;  Location: [facility];  Service: Gastroenterology;;   CARDIAC CATHETERIZATION N/A 01/30/2016   Procedure: Left Heart Cath and Coronary Angiography;  Surgeon: [name], MD;  Location: [facility];  Service: Cardiovascular;  Laterality: N/A;   ENTEROSCOPY N/A 12/25/2020   Procedure: ENTEROSCOPY;  Surgeon: [name], MD;  Location: [facility];  Service: Gastroenterology;  Laterality: N/A;   GIVENS CAPSULE STUDY N/A 12/25/2020   Procedure: GIVENS CAPSULE STUDY;  Surgeon: [name]., MD;  Location: [facility];  Service: Gastroenterology;  Laterality: N/A;   SUBMUCOSAL TATTOO INJECTION  12/25/2020   Procedure: SUBMUCOSAL TATTOO INJECTION;  Surgeon: [name], MD;  Location: [facility];  Service: Gastroenterology;;   No Known Allergies Prior to Admission medications   Medication Sig Start Date End Date Taking? Authorizing Provider  amiodarone (PACERONE) 200 MG tablet Take 1 tablet (200 mg total) by mouth daily. Follow up appt required for further refills. Please call [phone] to schedule an appt 01/28/21  Yes Bensimhon, Shaune Pascal, MD  atorvastatin (LIPITOR) 80 MG tablet TAKE 1 TABLET(80 MG) BY MOUTH DAILY 12/22/20  Yes [name], MD  blood glucose meter kit and supplies Use up to two times daily as directed  ICD10 E10.9 E11.9 02/20/19  Yes  [name], MD  Blood Glucose Monitoring Suppl (ONETOUCH VERIO) w/Device KIT as directed.  09/03/15  Yes [provider]  citalopram (CELEXA) 20 MG tablet TAKE 1 TABLET(20 MG) BY MOUTH DAILY 05/26/21  Yes [name], MD  cyanocobalamin 500 MCG tablet Take 500 mcg by mouth daily. Reported on 12/02/2015   Yes [provider]  diltiazem (CARDIZEM CD) 180 MG 24 hr capsule TAKE 1 CAPSULE(180 MG) BY MOUTH DAILY 08/24/21  Yes Bensimhon, Shaune Pascal, MD  ELIQUIS 2.5 MG TABS tablet TAKE 1 TABLET(2.5 MG) BY MOUTH TWICE DAILY 01/15/21  Yes Bensimhon, Shaune Pascal, MD  FARXIGA 5 MG TABS tablet TAKE 1 TABLET(5 MG) BY MOUTH DAILY 04/16/21  Yes [name], MD  glipiZIDE (GLUCOTROL) 5 MG tablet TAKE 1/2 TABLET(2.5 MG) BY MOUTH TWICE DAILY BEFORE BREAKFAST AND DINNER 08/21/21  Yes [name], MD  GlucoCom Lancets MISC Use for home glucose monitoring 09/02/15  Yes Jeffery, Chelle, PA  glucose blood (ONETOUCH VERIO) test strip Test up to 2 times per day.  Uncontrolled diabetes with hyperglycemia and stage 4 CKD. 04/13/19  Yes [name], MD  pantoprazole (PROTONIX) 40 MG tablet TAKE 1 TABLET(40 MG) BY MOUTH DAILY 04/27/21  Yes Lemmon, Lavone Nian, PA  polyethylene glycol (MIRALAX / GLYCOLAX) 17 g packet Take 17 g by mouth daily as needed for mild constipation or moderate constipation.    Yes [provider]  senna (SENOKOT) 8.6 MG tablet Take 1 tablet (8.6 mg total) by mouth as needed for constipation. 12/27/20  Yes Nicole Kindred A, DO  sitaGLIPtin (JANUVIA) 25 MG tablet TAKE 1 TABLET(25 MG) BY MOUTH DAILY 12/22/20  Yes [name], MD  tamsulosin (FLOMAX) 0.4 MG CAPS capsule TAKE 2 CAPSULES(0.8 MG) BY MOUTH DAILY 01/08/20  Yes [name], MD  torsemide (DEMADEX) 20 MG tablet Take 1 tablet (20 mg total) by mouth as needed. 12/27/20  Yes Ezekiel Slocumb, DO   Social History   Socioeconomic History   Marital status: Married    Spouse name: [name]    Number of children: 1   Years of education: 12th grade   Highest education level: Not on file  Occupational History   Occupation: Retired-accountant  Tobacco Use   Smoking status: Light Smoker    Packs/day: 0.10    Years: 48.00    Pack years: 4.80    Types: Cigarettes    Last attempt to quit: 07/16/2019    Years since quitting: 2.1   Smokeless tobacco: Never   Tobacco comments:    per pt he has one cigarette every couple days  Vaping Use   Vaping Use: Never used  Substance and Sexual Activity   Alcohol use: No    Alcohol/week: 0.0 standard drinks   Drug use: No   Sexual activity: Never    Partners: Female  Other Topics Concern   Not on file  Social History Narrative   Originally from [place]. Came to the [place] in 2009, following their son who came here for school.   Married.   Lives with his wife.   Their adult son lives in [place], [place], where he is in optometry school.   Education: [employer]   Exercise: No   Social Determinants of Radio broadcast assistant Strain: Not on file  Food Insecurity: Not on file  Transportation Needs: Not on file  Physical Activity: Not on file  Stress: Not on file  Social Connections: Not on file  Intimate Partner Violence: Not on file    Review of Systems Per HPI.   Objective:   Vitals:   08/27/21 1359  BP: 132/68  Pulse: 75  Resp: 16  Temp: 98.3 F (36.8 C)  TempSrc: Temporal  SpO2: 95%  Weight: 123 lb (55.8 kg)  Height: _0  (1.676 m)     Physical Exam Vitals reviewed.  Constitutional:      Appearance: He is well-developed.  HENT:     Head: Normocephalic and atraumatic.  Neck:     Vascular: No carotid bruit or JVD.  Cardiovascular:     Rate and Rhythm: Normal rate and regular rhythm.     Heart sounds: Normal heart sounds. No murmur heard.    Comments:  Distant, regular.  Pulmonary:     Effort: Pulmonary effort is normal. No respiratory distress.     Breath sounds: Normal breath sounds. No rales.     Comments:  Few distant course breath sounds.  Musculoskeletal:     Right lower leg: No edema.     Left lower leg: No edema.  Skin:    General: Skin is warm and dry.  Neurological:     Mental Status: He is alert and oriented to person, place, and time.  Psychiatric:        Mood and Affect: Mood normal.       Assessment & Plan:  Mitesh Rosendahl is a 76 y.o. male . Subacute cough - Plan: DG Chest 2 View  -Afebrile.  Possible postnasal drip/sinus contribution to cough.  Option of over-the-counter Flonase trial.  Check chest x-ray, history of diastolic CHF.  Appears to be euvolemic on exam.  RTC precautions/ER precautions if any worsening.  Type 2 diabetes mellitus with chronic kidney disease, without long-term current use of insulin, unspecified CKD stage ([place]) - Plan: Hemoglobin A1c  -Variable home readings, single episode of hypoglycemia in the past month.  No med changes for now, check labs and decide on further changes.  Depression, recurrent ([place])  -Suspect component of adjustment disorder, with underlying depression.  Concerned about fistula and progression to dialysis.  Hesitant to increase his Celexa given risk of QT prolongation with amiodarone.  Decided on remain on same dose but did recommend counseling, deferred at this time.  Handout given on management of depression with RTC precautions.  Chronic diastolic heart failure (HCC) Appears euvolemic.  Continue routine follow-up with cardiology.  No orders of the defined types were placed in this encounter.  Patient Instructions  C[facility] at [facility]  If chest xray looks ok, try flonase nasal spray. If cough not continuing to improve in next week - return for recheck.  I will check diabetes test and let you know if changes needed.  Discuss the weight with nephrology, but if they do not think it is due to your kidney disease, follow up with me so we can look at other causes.  See information on depression. I would consider meeting  with a counselor. Medication not increased as possible interaction with another one of your meds. Recheck 1 month. Return to the clinic or go to the nearest emergency room if any of your symptoms worsen or new symptoms occur.  Managing Depression, Adult Depression is a mental health condition that affects your thoughts, feelings, and actions. Being diagnosed with depression can bring you relief if you did not know why you have felt or behaved a certain way. It could also leave you feeling overwhelmed with uncertainty about your future. Preparing yourself to manage your symptoms can help you feel more positive about your future. How to manage lifestyle changes Managing stress Stress is your body's reaction to life changes and events, both good and bad. Stress can add to your feelings of depression. Learning to manage your stress can help lessen your feelings of depression. Try some of the following approaches to reducing your stress (stress reduction techniques): Listen to music that you enjoy and that inspires you. Try using a meditation app or take a meditation class. Develop a practice that helps you connect with your spiritual self. Walk in nature, pray, or go to a place of worship. Do some deep breathing. To do this, inhale slowly through your nose. Pause at the top of  your inhale for a few seconds and then exhale slowly, letting your muscles relax. Practice yoga to help relax and work your muscles. Choose a stress reduction technique that suits your lifestyle and personality. These techniques take time and practice to develop. Set aside 5-15 minutes a day to do them. Therapists can offer training in these techniques. Other things you can do to manage stress include: Keeping a stress diary. Knowing your limits and saying no when you think something is too much. Paying attention to how you react to certain situations. You may not be able to control everything, but you can change your  reaction. Adding humor to your life by watching funny films or TV shows. Making time for activities that you enjoy and that relax you.  Medicines Medicines, such as antidepressants, are often a part of treatment for depression. Talk with your pharmacist or health care provider about all the medicines, supplements, and herbal products that you take, their possible side effects, and what medicines and other products are safe to take together. Make sure to report any side effects you may have to your health care provider. Relationships Your health care provider may suggest family therapy, couples therapy, or individual therapy as part of your treatment. How to recognize changes Everyone responds differently to treatment for depression. As you recover from depression, you may start to: Have more interest in doing activities. Feel less hopeless. Have more energy. Overeat less often, or have a better appetite. Have better mental focus. It is important to recognize if your depression is not getting better or is getting worse. The symptoms you had in the beginning may return, such as: Tiredness (fatigue) or low energy. Eating too much or too little. Sleeping too much or too little. Feeling restless, agitated, or hopeless. Trouble focusing or making decisions. Unexplained physical complaints. Feeling irritable, angry, or aggressive. If you or your family members notice these symptoms coming back, let your health care provider know right away. Follow these instructions at home: Activity  Try to get some form of exercise each day, such as walking, biking, swimming, or lifting weights. Practice stress reduction techniques. Engage your mind by taking a class or doing some volunteer work. Lifestyle Get the right amount and quality of sleep. Cut down on using caffeine, tobacco, alcohol, and other potentially harmful substances. Eat a healthy diet that includes plenty of vegetables, fruits, whole  grains, low-fat dairy products, and lean protein. Do not eat a lot of foods that are high in solid fats, added sugars, or salt (sodium). General instructions Take over-the-counter and prescription medicines only as told by your health care provider. Keep all follow-up visits as told by your health care provider. This is important. Where to find support Talking to others Friends and family members can be sources of support and guidance. Talk to trusted friends or family members about your condition. Explain your symptoms to them, and let them know that you are working with a health care provider to treat your depression. Tell friends and family members how they also can be helpful. Finances Find appropriate mental health providers that fit with your financial situation. Talk with your health care provider about options to get reduced prices on your medicines. Where to find more information You can find support in your area from: Anxiety and Depression Association of America (ADAA): www.adaa.org Mental Health America: www.mentalhealthamerica.net [employer] on Mental Illness: www.nami.org Contact a health care provider if: You stop taking your antidepressant medicines, and you have any  of these symptoms: Nausea. Headache. Light-headedness. Chills and body aches. Not being able to sleep (insomnia). You or your friends and family think your depression is getting worse. Get help right away if: You have thoughts of hurting yourself or others. If you ever feel like you may hurt yourself or others, or have thoughts about taking your own life, get help right away. Go to your nearest emergency department or: Call your local emergency services (911 in the [place] Call a suicide crisis helpline, such as the [place] at 613-806-8008 or 988 in the [place]. This is open 24 hours a day in the [place] Text the Crisis Text Line at 305-325-6788 (in the [place].). Summary If you are  diagnosed with depression, preparing yourself to manage your symptoms is a good way to feel positive about your future. Work with your health care provider on a management plan that includes stress reduction techniques, medicines (if applicable), therapy, and healthy lifestyle habits. Keep talking with your health care provider about how your treatment is working. If you have thoughts about taking your own life, call a suicide crisis helpline or text a crisis text line. This information is not intended to replace advice given to you by your health care provider. Make sure you discuss any questions you have with your health care provider. Document Revised: 01/21/2021 Document Reviewed: 05/09/2019 Elsevier Patient Education  2022 [place],   Merri Ray, MD [place] 08/27/21 2:13 PM

## 2021-08-28 ENCOUNTER — Other Ambulatory Visit: Payer: Self-pay

## 2021-08-28 ENCOUNTER — Encounter: Payer: Self-pay | Admitting: Family Medicine

## 2021-08-28 ENCOUNTER — Other Ambulatory Visit: Payer: Self-pay | Admitting: Family Medicine

## 2021-08-28 ENCOUNTER — Ambulatory Visit (INDEPENDENT_AMBULATORY_CARE_PROVIDER_SITE_OTHER)
Admission: RE | Admit: 2021-08-28 | Discharge: 2021-08-28 | Disposition: A | Discharge status: Home / Self Care | Payer: Medicare Other | Source: Ambulatory Visit | Attending: Family Medicine | Admitting: Family Medicine

## 2021-08-28 DIAGNOSIS — N184 Chronic kidney disease, stage 4 (severe): Secondary | ICD-10-CM

## 2021-08-28 DIAGNOSIS — R052 Subacute cough: Secondary | ICD-10-CM

## 2021-08-28 DIAGNOSIS — J90 Pleural effusion, not elsewhere classified: Secondary | ICD-10-CM

## 2021-08-28 LAB — HEMOGLOBIN A1C: Hgb A1c MFr Bld: 7.9 % — ABNORMAL HIGH (ref 4.6–6.5)

## 2021-08-28 MED ORDER — TORSEMIDE 20 MG PO TABS: 20.0000 mg | ORAL_TABLET | Freq: Two times a day (BID) | ORAL | 0 refills | Status: DC

## 2021-08-28 NOTE — Progress Notes (Signed)
See previous note regarding chest x-ray results.  Discussed with pulmonary then nephrology, will increase his torsemide to twice daily for the next few days until he is seen by nephrology.  Needs refill.

## 2021-08-31 ENCOUNTER — Other Ambulatory Visit: Payer: Self-pay | Admitting: Family Medicine

## 2021-08-31 DIAGNOSIS — E1122 Type 2 diabetes mellitus with diabetic chronic kidney disease: Secondary | ICD-10-CM

## 2021-09-01 ENCOUNTER — Other Ambulatory Visit (HOSPITAL_COMMUNITY): Payer: Medicare Other

## 2021-09-03 ENCOUNTER — Other Ambulatory Visit: Payer: Self-pay

## 2021-09-03 ENCOUNTER — Encounter (HOSPITAL_COMMUNITY): Payer: Self-pay | Admitting: Vascular Surgery

## 2021-09-03 NOTE — Progress Notes (Addendum)
I called Mr. Lybrand number with an [place] interpreter, Patient's  wife , JAmie designated party release picked up.  Mr. Punt  wants me to speak to his wife.  I kept the [place] on the line to assist when needed.  Council Mechanic reports that Mr. Carolin Coy has not complain with chest pain or shortness of breath recently. "He did get short of breath , he came to the hospital with complaints of shortness of breath," Roselyn Reef reported; "after he was started on torsemide, he has felt much better."  Mr. Tresa Moore cardiologist is Dr. Haroldine Laws, Mora Appl reports that Dr. Haroldine Laws  told Mr. Nabers that he no longer needs to see him; he will go back to see a non specialized cardiologist.       Mr. Mercadel has type II diabetes, Roselyn Reef reports that CBG is checked daily,  CBGs run 150-160  last A1C was 7.9. I instructed Roselyn Reef to tell Mr. Cavanah to not take evening dose of Glipizide and to not take any medications for diabetes in am. I instructed patient to check CBG after awaking and every 2 hours until arrival  to the hospital.  I Instructed Jamie to ask Mr. Sanor to check CBG after awaking and every 2 hours until arrival  to the hospital. I Instructed patient if CBG is less than 70 to take 4 Glucose Tablets or 1 tube of Glucose Gel or 1/2 cup of a clear juice. Recheck CBG in 15 minutes if CBG is not over 70 call, pre- op desk at [phone] for further instructions.    PCP is Dr. Chinita Pester. I instructed Mrs to have Mr. Toso to shower  in am with antibiotic soap, if it is available.  Dry off with a clean towel. Do not put lotion, powder, cologne or deodorant or makeup.No jewelry or piercings. Men may shave their face and neck. Woman should not shave. No nail polish, artificial or acrylic nails. Wear clean clothes, brush your teeth. Glasses, contact lens,dentures or partials may not be worn in the OR. If you need to wear them, please bring a case for glasses, do not wear  contacts or bring a case, the hospital does not have contact cases, dentures or partials will have to be removed , make sure they are clean, we will provide a denture cup to put them in. You will need some one to drive you home and a responsible person over the age of 45 to stay with you for the first 24 hours after surgery.

## 2021-09-03 NOTE — Progress Notes (Signed)
I called Mr. Copes number with an [place] interpreter, Patient's  wife , Roselyn Reef- designated party release picked up.  Mr. Lucarelli  wants me to speak to his wife.  I kept the [place] on the line to assist when needed.  Roselyn Reef reports that Mr. Rybacki does not complain with chest pain or shortness of breath. "He did get short of breath , he came to the hospital with complaints of shortness of breath.

## 2021-09-04 ENCOUNTER — Encounter (HOSPITAL_COMMUNITY)
Admission: RE | Disposition: A | Discharge status: Home / Self Care | Payer: Self-pay | Source: Home / Self Care | Attending: Vascular Surgery

## 2021-09-04 ENCOUNTER — Ambulatory Visit (HOSPITAL_COMMUNITY): Payer: Medicare Other | Admitting: Anesthesiology

## 2021-09-04 ENCOUNTER — Ambulatory Visit (HOSPITAL_COMMUNITY)
Admission: RE | Admit: 2021-09-04 | Discharge: 2021-09-04 | Disposition: A | Discharge status: Home / Self Care | Payer: Medicare Other | Attending: Vascular Surgery | Admitting: Vascular Surgery

## 2021-09-04 ENCOUNTER — Ambulatory Visit (HOSPITAL_BASED_OUTPATIENT_CLINIC_OR_DEPARTMENT_OTHER): Payer: Medicare Other | Admitting: Anesthesiology

## 2021-09-04 ENCOUNTER — Other Ambulatory Visit: Payer: Self-pay

## 2021-09-04 DIAGNOSIS — E1122 Type 2 diabetes mellitus with diabetic chronic kidney disease: Secondary | ICD-10-CM | POA: Diagnosis present

## 2021-09-04 DIAGNOSIS — F32A Depression, unspecified: Secondary | ICD-10-CM | POA: Diagnosis not present

## 2021-09-04 DIAGNOSIS — F17210 Nicotine dependence, cigarettes, uncomplicated: Secondary | ICD-10-CM | POA: Insufficient documentation

## 2021-09-04 DIAGNOSIS — Z992 Dependence on renal dialysis: Secondary | ICD-10-CM | POA: Diagnosis not present

## 2021-09-04 DIAGNOSIS — N184 Chronic kidney disease, stage 4 (severe): Secondary | ICD-10-CM | POA: Diagnosis not present

## 2021-09-04 DIAGNOSIS — Z7984 Long term (current) use of oral hypoglycemic drugs: Secondary | ICD-10-CM | POA: Insufficient documentation

## 2021-09-04 DIAGNOSIS — N186 End stage renal disease: Secondary | ICD-10-CM | POA: Diagnosis not present

## 2021-09-04 DIAGNOSIS — I272 Pulmonary hypertension, unspecified: Secondary | ICD-10-CM | POA: Insufficient documentation

## 2021-09-04 DIAGNOSIS — I120 Hypertensive chronic kidney disease with stage 5 chronic kidney disease or end stage renal disease: Secondary | ICD-10-CM | POA: Diagnosis not present

## 2021-09-04 DIAGNOSIS — K219 Gastro-esophageal reflux disease without esophagitis: Secondary | ICD-10-CM | POA: Insufficient documentation

## 2021-09-04 DIAGNOSIS — Z79899 Other long term (current) drug therapy: Secondary | ICD-10-CM | POA: Diagnosis not present

## 2021-09-04 DIAGNOSIS — I509 Heart failure, unspecified: Secondary | ICD-10-CM | POA: Insufficient documentation

## 2021-09-04 DIAGNOSIS — I130 Hypertensive heart and chronic kidney disease with heart failure and stage 1 through stage 4 chronic kidney disease, or unspecified chronic kidney disease: Secondary | ICD-10-CM | POA: Diagnosis not present

## 2021-09-04 HISTORY — PX: AV FISTULA PLACEMENT: SHX1204

## 2021-09-04 HISTORY — DX: Personal history of other medical treatment: Z92.89

## 2021-09-04 HISTORY — DX: Gastro-esophageal reflux disease without esophagitis: K21.9

## 2021-09-04 LAB — POCT I-STAT, CHEM 8
BUN: 58 mg/dL — ABNORMAL HIGH (ref 8–23)
Calcium, Ion: 1.11 mmol/L — ABNORMAL LOW (ref 1.15–1.40)
Chloride: 108 mmol/L (ref 98–111)
Creatinine, Ser: 4 mg/dL — ABNORMAL HIGH (ref 0.61–1.24)
Glucose, Bld: 154 mg/dL — ABNORMAL HIGH (ref 70–99)
HCT: 32 % — ABNORMAL LOW (ref 39.0–52.0)
Hemoglobin: 10.9 g/dL — ABNORMAL LOW (ref 13.0–17.0)
Potassium: 4 mmol/L (ref 3.5–5.1)
Sodium: 136 mmol/L (ref 135–145)
TCO2: 20 mmol/L — ABNORMAL LOW (ref 22–32)

## 2021-09-04 LAB — GLUCOSE, CAPILLARY
Glucose-Capillary: 154 mg/dL — ABNORMAL HIGH (ref 70–99)
Glucose-Capillary: 144 mg/dL — ABNORMAL HIGH (ref 70–99)

## 2021-09-04 SURGERY — ARTERIOVENOUS (AV) FISTULA CREATION
Anesthesia: Monitor Anesthesia Care | Site: Arm Upper | Laterality: Right

## 2021-09-04 MED ORDER — CHLORHEXIDINE GLUCONATE 4 % EX LIQD: 60.0000 mL | Freq: Once | CUTANEOUS | Status: DC

## 2021-09-04 MED ORDER — FENTANYL CITRATE PF 50 MCG/ML IJ SOSY
50.0000 ug | PREFILLED_SYRINGE | Freq: Once | INTRAMUSCULAR | Status: DC
Start: 2021-09-04 — End: 2021-09-04
  Filled 2021-09-04: qty 1

## 2021-09-04 MED ORDER — FENTANYL CITRATE (PF) 100 MCG/2ML IJ SOLN
INTRAMUSCULAR | Status: AC
  Administered 2021-09-04: 100 ug
  Filled 2021-09-04: qty 2

## 2021-09-04 MED ORDER — HEPARIN 6000 UNIT IRRIGATION SOLUTION
Status: AC
  Filled 2021-09-04: qty 500

## 2021-09-04 MED ORDER — CEFAZOLIN SODIUM-DEXTROSE 2-3 GM-%(50ML) IV SOLR
INTRAVENOUS | Status: DC | PRN
  Administered 2021-09-04: 2 g via INTRAVENOUS

## 2021-09-04 MED ORDER — 0.9 % SODIUM CHLORIDE (POUR BTL) OPTIME
TOPICAL | Status: DC | PRN
  Administered 2021-09-04: 1000 mL

## 2021-09-04 MED ORDER — HEPARIN SODIUM (PORCINE) 1000 UNIT/ML IJ SOLN
INTRAMUSCULAR | Status: DC | PRN
  Administered 2021-09-04: 3000 [IU] via INTRAVENOUS

## 2021-09-04 MED ORDER — SODIUM CHLORIDE 0.9 % IV SOLN: INTRAVENOUS | Status: DC

## 2021-09-04 MED ORDER — CEFAZOLIN SODIUM-DEXTROSE 2-4 GM/100ML-% IV SOLN
2.0000 g | INTRAVENOUS | Status: DC
  Filled 2021-09-04: qty 100

## 2021-09-04 MED ORDER — FENTANYL CITRATE (PF) 250 MCG/5ML IJ SOLN
INTRAMUSCULAR | Status: DC | PRN
  Administered 2021-09-04: 25 ug via INTRAVENOUS

## 2021-09-04 MED ORDER — HEPARIN 6000 UNIT IRRIGATION SOLUTION
Status: DC | PRN
  Administered 2021-09-04: 1

## 2021-09-04 MED ORDER — FENTANYL CITRATE (PF) 250 MCG/5ML IJ SOLN
INTRAMUSCULAR | Status: AC
  Filled 2021-09-04: qty 5

## 2021-09-04 MED ORDER — OXYCODONE-ACETAMINOPHEN 5-325 MG PO TABS: 1.0000 | ORAL_TABLET | Freq: Four times a day (QID) | ORAL | 0 refills | Status: DC | PRN

## 2021-09-04 MED ORDER — LIDOCAINE HCL (PF) 1 % IJ SOLN
INTRAMUSCULAR | Status: AC
  Filled 2021-09-04: qty 30

## 2021-09-04 MED ORDER — PHENYLEPHRINE 40 MCG/ML (10ML) SYRINGE FOR IV PUSH (FOR BLOOD PRESSURE SUPPORT)
PREFILLED_SYRINGE | INTRAVENOUS | Status: AC
  Filled 2021-09-04: qty 10

## 2021-09-04 MED ORDER — ACETAMINOPHEN 500 MG PO TABS
1000.0000 mg | ORAL_TABLET | Freq: Once | ORAL | Status: AC
  Administered 2021-09-04: 1000 mg via ORAL
  Filled 2021-09-04: qty 2

## 2021-09-04 MED ORDER — PROPOFOL 10 MG/ML IV BOLUS
INTRAVENOUS | Status: AC
  Filled 2021-09-04: qty 20

## 2021-09-04 MED ORDER — CHLORHEXIDINE GLUCONATE 0.12 % MT SOLN
15.0000 mL | Freq: Once | OROMUCOSAL | Status: AC
  Administered 2021-09-04: 15 mL via OROMUCOSAL
  Filled 2021-09-04: qty 15

## 2021-09-04 MED ORDER — ONDANSETRON HCL 4 MG/2ML IJ SOLN: 4.0000 mg | Freq: Once | INTRAMUSCULAR | Status: DC | PRN

## 2021-09-04 MED ORDER — OXYCODONE HCL 5 MG PO TABS: 5.0000 mg | ORAL_TABLET | Freq: Once | ORAL | Status: DC | PRN

## 2021-09-04 MED ORDER — ORAL CARE MOUTH RINSE: 15.0000 mL | Freq: Once | OROMUCOSAL | Status: AC

## 2021-09-04 MED ORDER — PROPOFOL 500 MG/50ML IV EMUL
INTRAVENOUS | Status: DC | PRN
  Administered 2021-09-04: 75 ug/kg/min via INTRAVENOUS

## 2021-09-04 MED ORDER — INSULIN ASPART 100 UNIT/ML IJ SOLN: 0.0000 [IU] | INTRAMUSCULAR | Status: DC | PRN

## 2021-09-04 MED ORDER — FENTANYL CITRATE (PF) 100 MCG/2ML IJ SOLN: 25.0000 ug | INTRAMUSCULAR | Status: DC | PRN

## 2021-09-04 MED ORDER — MEPIVACAINE HCL (PF) 2 % IJ SOLN
INTRAMUSCULAR | Status: DC | PRN
  Administered 2021-09-04: 15 mL

## 2021-09-04 MED ORDER — OXYCODONE HCL 5 MG/5ML PO SOLN: 5.0000 mg | Freq: Once | ORAL | Status: DC | PRN

## 2021-09-04 SURGICAL SUPPLY — 33 items
ARMBAND PINK RESTRICT EXTREMIT (MISCELLANEOUS) ×4 IMPLANT
BAG COUNTER SPONGE SURGICOUNT (BAG) ×2 IMPLANT
BLADE CLIPPER SURG (BLADE) ×2 IMPLANT
CANISTER SUCT 3000ML PPV (MISCELLANEOUS) ×2 IMPLANT
CLIP VESOCCLUDE MED 6/CT (CLIP) ×2 IMPLANT
CLIP VESOCCLUDE SM WIDE 6/CT (CLIP) ×2 IMPLANT
COVER PROBE W GEL 5X96 (DRAPES) ×2 IMPLANT
DECANTER SPIKE VIAL GLASS SM (MISCELLANEOUS) ×2 IMPLANT
DERMABOND ADVANCED (GAUZE/BANDAGES/DRESSINGS) ×1
DERMABOND ADVANCED .7 DNX12 (GAUZE/BANDAGES/DRESSINGS) ×1 IMPLANT
ELECT REM PT RETURN 9FT ADLT (ELECTROSURGICAL) ×2
ELECTRODE REM PT RTRN 9FT ADLT (ELECTROSURGICAL) ×1 IMPLANT
GLOVE SRG 8 PF TXTR STRL LF DI (GLOVE) ×1 IMPLANT
GLOVE SURG ENC MOIS LTX SZ7.5 (GLOVE) ×2 IMPLANT
GLOVE SURG UNDER POLY LF SZ8 (GLOVE) ×1
GOWN STRL REUS W/ TWL LRG LVL3 (GOWN DISPOSABLE) ×2 IMPLANT
GOWN STRL REUS W/ TWL XL LVL3 (GOWN DISPOSABLE) ×2 IMPLANT
GOWN STRL REUS W/TWL LRG LVL3 (GOWN DISPOSABLE) ×1
GOWN STRL REUS W/TWL XL LVL3 (GOWN DISPOSABLE) ×2
HEMOSTAT SPONGE AVITENE ULTRA (HEMOSTASIS) IMPLANT
KIT BASIN OR (CUSTOM PROCEDURE TRAY) ×2 IMPLANT
KIT TURNOVER KIT B (KITS) ×2 IMPLANT
NS IRRIG 1000ML POUR BTL (IV SOLUTION) ×2 IMPLANT
PACK CV ACCESS (CUSTOM PROCEDURE TRAY) ×2 IMPLANT
PAD ARMBOARD 7.5X6 YLW CONV (MISCELLANEOUS) ×4 IMPLANT
SUT MNCRL AB 4-0 PS2 18 (SUTURE) ×2 IMPLANT
SUT PROLENE 6 0 BV (SUTURE) ×4 IMPLANT
SUT PROLENE 7 0 BV 1 (SUTURE) IMPLANT
SUT VIC AB 3-0 SH 27 (SUTURE) ×1
SUT VIC AB 3-0 SH 27X BRD (SUTURE) ×1 IMPLANT
TOWEL GREEN STERILE (TOWEL DISPOSABLE) ×2 IMPLANT
UNDERPAD 30X36 HEAVY ABSORB (UNDERPADS AND DIAPERS) ×2 IMPLANT
WATER STERILE IRR 1000ML POUR (IV SOLUTION) ×2 IMPLANT

## 2021-09-04 NOTE — Anesthesia Postprocedure Evaluation (Signed)
Anesthesia Post Note  Patient: Haematologist  Procedure(s) Performed: RIGHT BRACHIOCEPHALIC ARTERIOVENOUS (AV) FISTULA CREATION (Right: Arm Upper)     Patient location during evaluation: PACU Anesthesia Type: Regional and MAC Level of consciousness: awake and alert Pain management: pain level controlled Vital Signs Assessment: post-procedure vital signs reviewed and stable Respiratory status: spontaneous breathing, nonlabored ventilation and respiratory function stable Cardiovascular status: blood pressure returned to baseline and stable Postop Assessment: no apparent nausea or vomiting Anesthetic complications: no   No notable events documented.  Last Vitals:  Vitals:   09/04/21 1045 09/04/21 1100  BP: (!) 154/78 (!) 152/73  Pulse: 75 78  Resp: 16 18  Temp:  36.5 C  SpO2: 96% 95%    Last Pain:  Vitals:   09/04/21 1100  TempSrc:   PainSc: 0-No pain                 [name]

## 2021-09-04 NOTE — Op Note (Signed)
OPERATIVE NOTE   PROCEDURE: right brachiocephalic arteriovenous fistula placement  PRE-OPERATIVE DIAGNOSIS: chronic kidney disease stage IV  POST-OPERATIVE DIAGNOSIS: same as above   SURGEON: [name], MD  ASSISTANT(S): OR Staff  ANESTHESIA: regional  ESTIMATED BLOOD LOSS: Minimal  FINDING(S): 1.  Cephalic vein: 3.5 mm, acceptable 2.  Brachial artery: 4.5 mm, disease free 3.  Venous outflow: palpable thrill  4.  Radial flow: palpable radial pulse  SPECIMEN(S):  none  INDICATIONS:   Adam Reid is a 76 y.o. male who presents with stage IV CKD and needs permanent hemodialysis access.  The patient is scheduled for right arm arteriovenous fistula placement.  The patient is aware the risks include but are not limited to: bleeding, infection, steal syndrome, nerve damage, ischemic monomelic neuropathy, failure to mature, and need for additional procedures.  The patient is aware of the risks of the procedure and elects to proceed forward.  DESCRIPTION: After full informed written consent was obtained from the patient, the patient was brought back to the operating room and placed supine upon the operating table.  Prior to induction, the patient received IV antibiotics.   After obtaining adequate anesthesia, the patient was then prepped and draped in the standard fashion for a right arm access procedure.  I turned my attention first to identifying the patient's cephalic vein and brachial artery.  Using SonoSite guidance, the location of these vessels were marked out on the skin and the cephalic vein was of usable caliber.  I made a transverse incision at the level of the antecubitum and dissected through the subcutaneous tissue and fascia to gain exposure of the brachial artery.  This was noted to be 4.5 mm in diameter externally.  This was dissected out proximally and distally and controlled with vessel loops .  I then dissected out the cephalic vein.  This was noted to  be 3.5 mm in diameter externally.  The distal segment of the vein was ligated with a  2-0 silk, and the vein was transected.  The proximal segment was interrogated with serial dilators.  The vein accepted up to a 4 mm dilator without any difficulty.  I then instilled the heparinized saline into the vein and clamped it.  At this point, I reset my exposure of the brachial artery.  The patient was given 3000 units IV heparin.  I then placed the artery under tension proximally and distally.  I made an arteriotomy with a #11 blade, and then I extended the arteriotomy with a Potts scissor.  I injected heparinized saline proximal and distal to this arteriotomy.  The vein was then sewn to the artery in an end-to-side configuration with a running stitch of 6-0 Prolene.  Prior to completing this anastomosis, I allowed the vein and artery to backbleed.  There was no evidence of clot from any vessels.  I completed the anastomosis in the usual fashion and then released all vessel loops and clamps.    There was a palpable thrill in the venous outflow, and there was a palpable radial pulse.  At this point, I irrigated out the surgical wound.  There was no further active bleeding.  The subcutaneous tissue was reapproximated with a running stitch of 3-0 Vicryl.  The skin was then reapproximated with a running subcuticular stitch of 4-0 Monocryl.  The skin was then cleaned, dried, and reinforced with Dermabond.  The patient tolerated this procedure well.   COMPLICATIONS: None  CONDITION: Stable  [name], MD [facility] Office: [place]   09/04/2021, 10:40 AM

## 2021-09-04 NOTE — Anesthesia Procedure Notes (Signed)
Anesthesia Regional Block: Supraclavicular block   Pre-Anesthetic Checklist: , timeout performed,  Correct Patient, Correct Site, Correct Laterality,  Correct Procedure, Correct Position, site marked,  Risks and benefits discussed,  Surgical consent,  Pre-op evaluation,  At surgeon's request and post-op pain management  Laterality: Right  Prep: Maximum Sterile Barrier Precautions used, chloraprep       Needles:  Injection technique: Single-shot  Needle Type: Echogenic Stimulator Needle     Needle Length: 9cm  Needle Gauge: 22     Additional Needles:   Procedures:,,,, ultrasound used (permanent image in chart),,    Narrative:  Start time: 09/04/2021 8:50 AM End time: 09/04/2021 8:55 AM Injection made incrementally with aspirations every 5 mL.  Performed by: Personally  Anesthesiologist: [name], DO  Additional Notes: Monitors applied. No increased pain on injection. No increased resistance to injection. Injection made in 5cc increments. Good needle visualization. Patient tolerated procedure well.

## 2021-09-04 NOTE — Discharge Instructions (Addendum)
[facility]  Discharge Instructions  AV Fistula or Graft Surgery for Dialysis Access  Please refer to the following instructions for your post-procedure care. Your surgeon or physician assistant will discuss any changes with you.  Activity  You may drive the day following your surgery, if you are comfortable and no longer taking prescription pain medication. Resume full activity as the soreness in your incision resolves.  Bathing/Showering  You may shower after you go home. Keep your incision dry for 48 hours. Do not soak in a bathtub, hot tub, or swim until the incision heals completely. You may not shower if you have a hemodialysis catheter.  Incision Care  Clean your incision with mild soap and water after 48 hours. Pat the area dry with a clean towel. You do not need a bandage unless otherwise instructed. Do not apply any ointments or creams to your incision. You may have skin glue on your incision. Do not peel it off. It will come off on its own in about one week. Your arm may swell a bit after surgery. To reduce swelling use pillows to elevate your arm so it is above your heart. Your doctor will tell you if you need to lightly wrap your arm with an ACE bandage.  Diet  Resume your normal diet. There are not special food restrictions following this procedure. In order to heal from your surgery, it is CRITICAL to get adequate nutrition. Your body requires vitamins, minerals, and protein. Vegetables are the best source of vitamins and minerals. Vegetables also provide the perfect balance of protein. Processed food has little nutritional value, so try to avoid this.  Medications  Resume taking all of your medications. If your incision is causing pain, you may take over-the counter pain relievers such as acetaminophen (Tylenol). If you were prescribed a stronger pain medication, please be aware these medications can cause nausea and constipation. Prevent  nausea by taking the medication with a snack or meal. Avoid constipation by drinking plenty of fluids and eating foods with high amount of fiber, such as fruits, vegetables, and grains.  Do not take Tylenol if you are taking prescription pain medications.  Follow up Your surgeon may want to see you in the office following your access surgery. If so, this will be arranged at the time of your surgery.  Please call us immediately for any of the following conditions:  Increased pain, redness, drainage (pus) from your incision site Fever of 101 degrees or higher Severe or worsening pain at your incision site Hand pain or numbness.  Reduce your risk of vascular disease:  Stop smoking. If you would like help, call [facility] at 1-800-QUIT-NOW (424)402-1619) or [place] at [place] your cholesterol Maintain a desired weight Control your diabetes Keep your blood pressure down  Dialysis  It will take several weeks to several months for your new dialysis access to be ready for use. Your surgeon will determine when it is okay to use it. Your nephrologist will continue to direct your dialysis. You can continue to use your Permcath until your new access is ready for use.   09/04/2021 Adam Reid [id number] [birth year]  Surgeon(s): [name], MD  Procedure(s): RIGHT BRACHIOCEPHALIC ARTERIOVENOUS (AV) FISTULA CREATION   May stick graft immediately   May stick graft on designated area only:   X Do not stick right AV fistula for 12 weeks    If you have any questions, please call the office at  [phone]. °

## 2021-09-04 NOTE — Anesthesia Preprocedure Evaluation (Addendum)
Anesthesia Evaluation  Patient identified by MRN, date of birth, ID band Patient awake    Reviewed: Allergy & Precautions, NPO status , Patient's Chart, lab work & pertinent test results  Airway Mallampati: II  TM Distance: >3 FB Neck ROM: Full    Dental no notable dental hx. (+) Teeth Intact, Dental Advisory Given   Pulmonary Current Smoker and Patient abstained from smoking.,    Pulmonary exam normal breath sounds clear to auscultation       Cardiovascular hypertension, Pt. on medications pulmonary hypertension (mild pHTN)Normal cardiovascular exam+ dysrhythmias (eliquis) Atrial Fibrillation + Valvular Problems/Murmurs (mild MR) MR  Rhythm:Regular Rate:Normal  Echo 07/2021: 1. Left ventricular ejection fraction, by estimation, is 55 to 60%. The  left ventricle has normal function. The left ventricle has no regional  wall motion abnormalities. There is mild concentric left ventricular  hypertrophy. Left ventricular diastolic  parameters were normal.  2. Right ventricular systolic function is normal. The right ventricular  size is normal. There is mildly elevated pulmonary artery systolic  pressure.  3. The mitral valve is normal in structure. Mild mitral valve  regurgitation. No evidence of mitral stenosis.  4. The aortic valve is normal in structure. Aortic valve regurgitation is  not visualized. No aortic stenosis is present.  5. The inferior vena cava is normal in size with greater than 50%  respiratory variability, suggesting right atrial pressure of 3 mmHg.   cath ? Ramus lesion, 20% stenosed. ? Ost 2nd Diag to 2nd Diag lesion, 50% stenosed. ? Mid LAD lesion, 20% stenosed. ? Normal LVEDP.    Neuro/Psych PSYCHIATRIC DISORDERS Depression negative neurological ROS     GI/Hepatic Neg liver ROS, GERD  Medicated and Controlled,  Endo/Other  diabetes, Well Controlled, Type 2, Oral Hypoglycemic Agentsa1c 7.9   Renal/GU ESRF and DialysisRenal disease  negative genitourinary   Musculoskeletal negative musculoskeletal ROS (+)   Abdominal   Peds  Hematology negative hematology ROS (+)   Anesthesia Other Findings   Reproductive/Obstetrics negative OB ROS                            Anesthesia Physical Anesthesia Plan  ASA: 3  Anesthesia Plan: MAC and Regional   Post-op Pain Management: Regional block* and Tylenol PO (pre-op)*   Induction:   PONV Risk Score and Plan: 2 and Propofol infusion and TIVA  Airway Management Planned: Natural Airway and Simple Face Mask  Additional Equipment: None  Intra-op Plan:   Post-operative Plan:   Informed Consent: I have reviewed the patients History and Physical, chart, labs and discussed the procedure including the risks, benefits and alternatives for the proposed anesthesia with the patient or authorized representative who has indicated his/her understanding and acceptance.     Consent reviewed with POA and Interpreter used for interveiw  Plan Discussed with: CRNA  Anesthesia Plan Comments: (Wife and interpretor at bedside)       Anesthesia Quick Evaluation

## 2021-09-04 NOTE — Anesthesia Procedure Notes (Signed)
Procedure Name: MAC Date/Time: 09/04/2021 9:49 AM Performed by: Lavell Luster, CRNA Pre-anesthesia Checklist: Patient identified, Emergency Drugs available, Suction available, Patient being monitored and Timeout performed Patient Re-evaluated:Patient Re-evaluated prior to induction Oxygen Delivery Method: Simple face mask Preoxygenation: Pre-oxygenation with 100% oxygen Induction Type: IV induction Ventilation: Oral airway inserted - appropriate to patient size Placement Confirmation: breath sounds checked- equal and bilateral and positive ETCO2 Dental Injury: Teeth and Oropharynx as per pre-operative assessment

## 2021-09-04 NOTE — H&P (Signed)
HPI: Adam Reid is a 76 y.o. male, with history of stage IV CKD, diabetes, hypertension that presents for evaluation of permanent hemodialysis access.  Patient is here with his wife who provides some of the history.  Sounds like he writes with his right hand but does most all other activities with his left hand.  Would consider himself left hand dominant.  No previous access in the past.  No chest wall implants.  Not on dialysis at this time.  No previous access.  Followed by Dr. [name] with nephrology.       Past Medical History:  Diagnosis Date   Anemia     CHF (congestive heart failure) (HCC)     Chronic kidney disease     Depression     Diabetes mellitus without complication ([place])     Hyperlipidemia     Hypertension     Thyroid disease      was on supplement, taken off by [name]           Past Surgical History:  Procedure Laterality Date   APPENDECTOMY       BIOPSY   12/25/2020    Procedure: BIOPSY;  Surgeon: [name]., MD;  Location: [facility];  Service: Gastroenterology;;   CARDIAC CATHETERIZATION N/A 01/30/2016    Procedure: Left Heart Cath and Coronary Angiography;  Surgeon: [name], MD;  Location: [facility];  Service: Cardiovascular;  Laterality: N/A;   ENTEROSCOPY N/A 12/25/2020    Procedure: ENTEROSCOPY;  Surgeon: [name], MD;  Location: [facility];  Service: Gastroenterology;  Laterality: N/A;   GIVENS CAPSULE STUDY N/A 12/25/2020    Procedure: GIVENS CAPSULE STUDY;  Surgeon: [name]., MD;  Location: [facility];  Service: Gastroenterology;  Laterality: N/A;   SUBMUCOSAL TATTOO INJECTION   12/25/2020    Procedure: SUBMUCOSAL TATTOO INJECTION;  Surgeon: [name], MD;  [facility];  Service: Gastroenterology;;           Family History  Problem Relation Age of Onset   Hypertension Mother     Hyperlipidemia Mother     Hypertension Sister     Kidney disease Father      Heart disease Brother 70        open heart surgery   Colon cancer Neg Hx        SOCIAL HISTORY: Social History         Socioeconomic History   Marital status: Married      Spouse name: [name]   Number of children: 1   Years of education: 12th grade   Highest education level: Not on file  Occupational History   Occupation: Retired-accountant  Tobacco Use   Smoking status: Light Smoker      Packs/day: 0.10      Years: 48.00      Pack years: 4.80      Types: Cigarettes      Last attempt to quit: 07/16/2019      Years since quitting: 1.9   Smokeless tobacco: Never   Tobacco comments:      per pt he has one cigarette every couple days  Vaping Use   Vaping Use: Never used  Substance and Sexual Activity   Alcohol use: No      Alcohol/week: 0.0 standard drinks   Drug use: No   Sexual activity: Never      Partners: Female  Other Topics Concern   Not on file  Social History Narrative  Originally from [place]. Came to the [place] in 2009, following their son who came here for school.    Married.    Lives with his wife.    Their adult son lives in [place], [place], where he is in optometry school.    Education: [employer]    Exercise: No    Social Determinants of Adult nurse Strain: Not on file  Food Insecurity: Not on file  Transportation Needs: Not on file  Physical Activity: Not on file  Stress: Not on file  Social Connections: Not on file  Intimate Partner Violence: Not on file      No Known Allergies         Current Outpatient Medications  Medication Sig Dispense Refill   amiodarone (PACERONE) 200 MG tablet Take 1 tablet (200 mg total) by mouth daily. Follow up appt required for further refills. Please call [phone] to schedule an appt 30 tablet 4   atorvastatin (LIPITOR) 80 MG tablet TAKE 1 TABLET(80 MG) BY MOUTH DAILY 90 tablet 1   blood glucose meter kit and supplies Use up to two times daily as directed  ICD10 E10.9 E11.9 1 each 0   Blood Glucose  Monitoring Suppl (ONETOUCH VERIO) w/Device KIT as directed.    0   citalopram (CELEXA) 20 MG tablet TAKE 1 TABLET(20 MG) BY MOUTH DAILY 90 tablet 1   cyanocobalamin 500 MCG tablet Take 500 mcg by mouth daily. Reported on 12/02/2015       diltiazem (CARDIZEM CD) 180 MG 24 hr capsule TAKE 1 CAPSULE(180 MG) BY MOUTH DAILY 60 capsule 2   ELIQUIS 2.5 MG TABS tablet TAKE 1 TABLET(2.5 MG) BY MOUTH TWICE DAILY 180 tablet 1   FARXIGA 5 MG TABS tablet TAKE 1 TABLET(5 MG) BY MOUTH DAILY 30 tablet 5   glipiZIDE (GLUCOTROL) 5 MG tablet Take 0.5 tablets (2.5 mg total) by mouth 2 (two) times daily before a meal. Breakfast and dinner. 30 tablet 2   GlucoCom Lancets MISC Use for home glucose monitoring 100 each 3   glucose blood (ONETOUCH VERIO) test strip Test up to 2 times per day.  Uncontrolled diabetes with hyperglycemia and stage 4 CKD. 100 each 3   pantoprazole (PROTONIX) 40 MG tablet TAKE 1 TABLET(40 MG) BY MOUTH DAILY 30 tablet 3   polyethylene glycol (MIRALAX / GLYCOLAX) 17 g packet Take 17 g by mouth daily as needed for mild constipation or moderate constipation.        senna (SENOKOT) 8.6 MG tablet Take 1 tablet (8.6 mg total) by mouth as needed for constipation.       sitaGLIPtin (JANUVIA) 25 MG tablet TAKE 1 TABLET(25 MG) BY MOUTH DAILY 90 tablet 1   tamsulosin (FLOMAX) 0.4 MG CAPS capsule TAKE 2 CAPSULES(0.8 MG) BY MOUTH DAILY 180 capsule 3   torsemide (DEMADEX) 20 MG tablet Take 1 tablet (20 mg total) by mouth as needed. 30 tablet 2    No current facility-administered medications for this visit.      REVIEW OF SYSTEMS:  _0  denotes positive finding, _1  denotes negative finding Cardiac   Comments:  Chest pain or chest pressure:      Shortness of breath upon exertion:      Short of breath when lying flat:      Irregular heart rhythm:             Vascular      Pain in calf, thigh, or hip brought on by ambulation:  Pain in feet at night that wakes you up from your sleep:       Blood clot  in your veins:      Leg swelling:              Pulmonary      Oxygen at home:      Productive cough:       Wheezing:              Neurologic      Sudden weakness in arms or legs:       Sudden numbness in arms or legs:       Sudden onset of difficulty speaking or slurred speech:      Temporary loss of vision in one eye:       Problems with dizziness:              Gastrointestinal      Blood in stool:       Vomited blood:              Genitourinary      Burning when urinating:       Blood in urine:             Psychiatric      Major depression:              Hematologic      Bleeding problems:      Problems with blood clotting too easily:             Skin      Rashes or ulcers:             Constitutional      Fever or chills:          PHYSICAL EXAM:    Vitals:    06/09/21 1208  BP: (!) 157/74  Pulse: 74  Resp: 16  Temp: 98.6 F (37 C)  TempSrc: Temporal  SpO2: 96%  Weight: 135 lb (61.2 kg)  Height: _0  (1.676 m)      GENERAL: The patient is a well-nourished male, in no acute distress. The vital signs are documented above. CARDIAC: There is a regular rate and rhythm.  VASCULAR:  Palpable radial and brachial pulses bilateral upper extremities PULMONARY: No respiratory distress. ABDOMEN: Soft and non-tender. MUSCULOSKELETAL: There are no major deformities or cyanosis. NEUROLOGIC: No focal weakness or paresthesias are detected. SKIN: There are no ulcers or rashes noted. PSYCHIATRIC: The patient has a normal affect.   DATA:    Arterial duplex shows triphasic waveforms in both upper extremities.   Upper extremity vein mapping shows a cephalic vein that is small in the upper arm and a better basilic vein in the right arm (non-dominant arm)   Assessment/Plan:   76 year old male with stage IV chronic kidney disease that presents for permanent dialysis access evaluation.  Patient would consider himself left hand dominant so we suggested placement in the  right arm.  He does have a better appearing basilic vein based on his vein mapping (compared to the cephalic vein).  I discussed if we use the basilic vein this would be done in two stages.  Certainly would reevaluate the cephalic vein with ultrasound in the OR.  Risks and benefits again discussed.  All questions answered.  Not on dialysis at this time.    [name], MD [facility] Office: [phone]

## 2021-09-04 NOTE — Transfer of Care (Signed)
Immediate Anesthesia Transfer of Care Note  Patient: Adam Reid  Procedure(s) Performed: RIGHT BRACHIOCEPHALIC ARTERIOVENOUS (AV) FISTULA CREATION (Right: Arm Upper)  Patient Location: PACU  Anesthesia Type:MAC  Level of Consciousness: awake, alert  and oriented  Airway & Oxygen Therapy: Patient connected to face mask oxygen  Post-op Assessment: Post -op Vital signs reviewed and stable  Post vital signs: stable  Last Vitals:  Vitals Value Taken Time  BP    Temp    Pulse 73 09/04/21 1028  Resp    SpO2 94 % 09/04/21 1028  Vitals shown include unvalidated device data.  Last Pain:  Vitals:   09/04/21 0900  TempSrc:   PainSc: 0-No pain         Complications: No notable events documented.

## 2021-09-05 ENCOUNTER — Encounter (HOSPITAL_COMMUNITY): Payer: Self-pay | Admitting: Vascular Surgery

## 2021-09-11 ENCOUNTER — Encounter (HOSPITAL_COMMUNITY)
Admission: RE | Admit: 2021-09-11 | Discharge: 2021-09-11 | Disposition: A | Discharge status: Home / Self Care | Payer: Medicare Other | Source: Ambulatory Visit | Attending: Nephrology | Admitting: Nephrology

## 2021-09-11 ENCOUNTER — Encounter (HOSPITAL_COMMUNITY): Payer: Self-pay

## 2021-09-11 ENCOUNTER — Other Ambulatory Visit: Payer: Self-pay

## 2021-09-11 VITALS — BP 149/81 | HR 86 | Resp 18

## 2021-09-11 DIAGNOSIS — N184 Chronic kidney disease, stage 4 (severe): Secondary | ICD-10-CM | POA: Diagnosis present

## 2021-09-11 DIAGNOSIS — D631 Anemia in chronic kidney disease: Secondary | ICD-10-CM | POA: Insufficient documentation

## 2021-09-11 DIAGNOSIS — N179 Acute kidney failure, unspecified: Secondary | ICD-10-CM

## 2021-09-11 LAB — IRON AND TIBC
Iron: 31 ug/dL — ABNORMAL LOW (ref 45–182)
Saturation Ratios: 13 % — ABNORMAL LOW (ref 17.9–39.5)
TIBC: 237 ug/dL — ABNORMAL LOW (ref 250–450)
UIBC: 206 ug/dL

## 2021-09-11 LAB — FERRITIN: Ferritin: 848 ng/mL — ABNORMAL HIGH (ref 24–336)

## 2021-09-11 LAB — POCT HEMOGLOBIN-HEMACUE: Hemoglobin: 9.5 g/dL — ABNORMAL LOW (ref 13.0–17.0)

## 2021-09-11 MED ORDER — EPOETIN ALFA-EPBX 10000 UNIT/ML IJ SOLN
10000.0000 [IU] | INTRAMUSCULAR | Status: DC
  Administered 2021-09-11: 10000 [IU] via SUBCUTANEOUS

## 2021-09-11 MED ORDER — EPOETIN ALFA-EPBX 10000 UNIT/ML IJ SOLN
INTRAMUSCULAR | Status: AC
  Filled 2021-09-11: qty 1

## 2021-09-14 ENCOUNTER — Other Ambulatory Visit: Payer: Self-pay

## 2021-09-14 ENCOUNTER — Emergency Department (HOSPITAL_BASED_OUTPATIENT_CLINIC_OR_DEPARTMENT_OTHER): Payer: Medicare Other

## 2021-09-14 ENCOUNTER — Encounter (HOSPITAL_BASED_OUTPATIENT_CLINIC_OR_DEPARTMENT_OTHER): Payer: Self-pay | Admitting: *Deleted

## 2021-09-14 ENCOUNTER — Inpatient Hospital Stay (HOSPITAL_BASED_OUTPATIENT_CLINIC_OR_DEPARTMENT_OTHER)
Admission: EM | Admit: 2021-09-14 | Discharge: 2021-09-18 | DRG: 291 | Disposition: A | Discharge status: Home / Self Care | Payer: Medicare Other | Attending: Internal Medicine | Admitting: Internal Medicine

## 2021-09-14 DIAGNOSIS — Z79899 Other long term (current) drug therapy: Secondary | ICD-10-CM

## 2021-09-14 DIAGNOSIS — N179 Acute kidney failure, unspecified: Secondary | ICD-10-CM | POA: Diagnosis present

## 2021-09-14 DIAGNOSIS — Z7901 Long term (current) use of anticoagulants: Secondary | ICD-10-CM | POA: Diagnosis not present

## 2021-09-14 DIAGNOSIS — Z8616 Personal history of COVID-19: Secondary | ICD-10-CM

## 2021-09-14 DIAGNOSIS — F17210 Nicotine dependence, cigarettes, uncomplicated: Secondary | ICD-10-CM | POA: Diagnosis not present

## 2021-09-14 DIAGNOSIS — J9601 Acute respiratory failure with hypoxia: Secondary | ICD-10-CM | POA: Diagnosis not present

## 2021-09-14 DIAGNOSIS — U071 COVID-19: Secondary | ICD-10-CM | POA: Diagnosis not present

## 2021-09-14 DIAGNOSIS — Z8249 Family history of ischemic heart disease and other diseases of the circulatory system: Secondary | ICD-10-CM | POA: Diagnosis not present

## 2021-09-14 DIAGNOSIS — N400 Enlarged prostate without lower urinary tract symptoms: Secondary | ICD-10-CM | POA: Diagnosis not present

## 2021-09-14 DIAGNOSIS — R0902 Hypoxemia: Secondary | ICD-10-CM

## 2021-09-14 DIAGNOSIS — E871 Hypo-osmolality and hyponatremia: Secondary | ICD-10-CM | POA: Diagnosis present

## 2021-09-14 DIAGNOSIS — E11649 Type 2 diabetes mellitus with hypoglycemia without coma: Secondary | ICD-10-CM | POA: Diagnosis not present

## 2021-09-14 DIAGNOSIS — D631 Anemia in chronic kidney disease: Secondary | ICD-10-CM | POA: Diagnosis present

## 2021-09-14 DIAGNOSIS — E039 Hypothyroidism, unspecified: Secondary | ICD-10-CM | POA: Diagnosis not present

## 2021-09-14 DIAGNOSIS — K219 Gastro-esophageal reflux disease without esophagitis: Secondary | ICD-10-CM | POA: Diagnosis present

## 2021-09-14 DIAGNOSIS — E872 Acidosis, unspecified: Secondary | ICD-10-CM | POA: Diagnosis present

## 2021-09-14 DIAGNOSIS — D509 Iron deficiency anemia, unspecified: Secondary | ICD-10-CM | POA: Diagnosis not present

## 2021-09-14 DIAGNOSIS — R0600 Dyspnea, unspecified: Secondary | ICD-10-CM

## 2021-09-14 DIAGNOSIS — N189 Chronic kidney disease, unspecified: Secondary | ICD-10-CM | POA: Diagnosis present

## 2021-09-14 DIAGNOSIS — E785 Hyperlipidemia, unspecified: Secondary | ICD-10-CM | POA: Diagnosis not present

## 2021-09-14 DIAGNOSIS — Z7984 Long term (current) use of oral hypoglycemic drugs: Secondary | ICD-10-CM | POA: Diagnosis not present

## 2021-09-14 DIAGNOSIS — E1169 Type 2 diabetes mellitus with other specified complication: Secondary | ICD-10-CM | POA: Diagnosis present

## 2021-09-14 DIAGNOSIS — E1165 Type 2 diabetes mellitus with hyperglycemia: Secondary | ICD-10-CM | POA: Diagnosis not present

## 2021-09-14 DIAGNOSIS — I509 Heart failure, unspecified: Secondary | ICD-10-CM

## 2021-09-14 DIAGNOSIS — F32A Depression, unspecified: Secondary | ICD-10-CM | POA: Diagnosis present

## 2021-09-14 DIAGNOSIS — I10 Essential (primary) hypertension: Secondary | ICD-10-CM | POA: Diagnosis present

## 2021-09-14 DIAGNOSIS — I5033 Acute on chronic diastolic (congestive) heart failure: Secondary | ICD-10-CM | POA: Diagnosis not present

## 2021-09-14 DIAGNOSIS — I480 Paroxysmal atrial fibrillation: Secondary | ICD-10-CM | POA: Diagnosis present

## 2021-09-14 DIAGNOSIS — I132 Hypertensive heart and chronic kidney disease with heart failure and with stage 5 chronic kidney disease, or end stage renal disease: Secondary | ICD-10-CM | POA: Diagnosis not present

## 2021-09-14 DIAGNOSIS — N185 Chronic kidney disease, stage 5: Secondary | ICD-10-CM | POA: Diagnosis present

## 2021-09-14 DIAGNOSIS — E1122 Type 2 diabetes mellitus with diabetic chronic kidney disease: Secondary | ICD-10-CM | POA: Diagnosis present

## 2021-09-14 DIAGNOSIS — E1129 Type 2 diabetes mellitus with other diabetic kidney complication: Secondary | ICD-10-CM | POA: Diagnosis present

## 2021-09-14 DIAGNOSIS — Z841 Family history of disorders of kidney and ureter: Secondary | ICD-10-CM

## 2021-09-14 LAB — CBC WITH DIFFERENTIAL/PLATELET
Abs Immature Granulocytes: 0.06 10*3/uL (ref 0.00–0.07)
Basophils Absolute: 0.1 10*3/uL (ref 0.0–0.1)
Basophils Relative: 0 %
Eosinophils Absolute: 0 10*3/uL (ref 0.0–0.5)
Eosinophils Relative: 0 %
HCT: 30.1 % — ABNORMAL LOW (ref 39.0–52.0)
Hemoglobin: 9.7 g/dL — ABNORMAL LOW (ref 13.0–17.0)
Immature Granulocytes: 1 %
Lymphocytes Relative: 3 %
Lymphs Abs: 0.3 10*3/uL — ABNORMAL LOW (ref 0.7–4.0)
MCH: 27.2 pg (ref 26.0–34.0)
MCHC: 32.2 g/dL (ref 30.0–36.0)
MCV: 84.3 fL (ref 80.0–100.0)
Monocytes Absolute: 0.6 10*3/uL (ref 0.1–1.0)
Monocytes Relative: 5 %
Neutro Abs: 11.7 10*3/uL — ABNORMAL HIGH (ref 1.7–7.7)
Neutrophils Relative %: 91 %
Platelets: 367 10*3/uL (ref 150–400)
RBC: 3.57 MIL/uL — ABNORMAL LOW (ref 4.22–5.81)
RDW: 16.5 % — ABNORMAL HIGH (ref 11.5–15.5)
WBC: 12.8 10*3/uL — ABNORMAL HIGH (ref 4.0–10.5)
nRBC: 0 % (ref 0.0–0.2)

## 2021-09-14 LAB — URINALYSIS, ROUTINE W REFLEX MICROSCOPIC
Bilirubin Urine: NEGATIVE
Glucose, UA: >=500 mg/dL — AB
Ketones, ur: NEGATIVE mg/dL
Leukocytes,Ua: NEGATIVE
Nitrite: NEGATIVE
Protein, ur: 100 mg/dL — AB
Specific Gravity, Urine: 1.015 (ref 1.005–1.030)
pH: 5 (ref 5.0–8.0)

## 2021-09-14 LAB — COMPREHENSIVE METABOLIC PANEL
ALT: 29 U/L (ref 0–44)
AST: 29 U/L (ref 15–41)
Albumin: 3.3 g/dL — ABNORMAL LOW (ref 3.5–5.0)
Alkaline Phosphatase: 210 U/L — ABNORMAL HIGH (ref 38–126)
Anion gap: 15 (ref 5–15)
BUN: 68 mg/dL — ABNORMAL HIGH (ref 8–23)
CO2: 17 mmol/L — ABNORMAL LOW (ref 22–32)
Calcium: 8.6 mg/dL — ABNORMAL LOW (ref 8.9–10.3)
Chloride: 102 mmol/L (ref 98–111)
Creatinine, Ser: 3.23 mg/dL — ABNORMAL HIGH (ref 0.61–1.24)
GFR, Estimated: 19 mL/min — ABNORMAL LOW (ref >60)
Glucose, Bld: 246 mg/dL — ABNORMAL HIGH (ref 70–99)
Potassium: 4.2 mmol/L (ref 3.5–5.1)
Sodium: 134 mmol/L — ABNORMAL LOW (ref 135–145)
Total Bilirubin: 0.6 mg/dL (ref 0.3–1.2)
Total Protein: 7.8 g/dL (ref 6.5–8.1)

## 2021-09-14 LAB — RESP PANEL BY RT-PCR (FLU A&B, COVID) ARPGX2
Influenza A by PCR: NEGATIVE
Influenza B by PCR: NEGATIVE
SARS Coronavirus 2 by RT PCR: POSITIVE — AB

## 2021-09-14 LAB — GLUCOSE, CAPILLARY
Glucose-Capillary: 31 mg/dL — CL (ref 70–99)
Glucose-Capillary: 210 mg/dL — ABNORMAL HIGH (ref 70–99)

## 2021-09-14 LAB — URINALYSIS, MICROSCOPIC (REFLEX)

## 2021-09-14 LAB — BRAIN NATRIURETIC PEPTIDE: B Natriuretic Peptide: 1427.2 pg/mL — ABNORMAL HIGH (ref 0.0–100.0)

## 2021-09-14 LAB — TROPONIN I (HIGH SENSITIVITY)
Troponin I (High Sensitivity): 44 ng/L — ABNORMAL HIGH (ref <18)
Troponin I (High Sensitivity): 41 ng/L — ABNORMAL HIGH (ref <18)

## 2021-09-14 MED ORDER — AMIODARONE HCL 200 MG PO TABS
200.0000 mg | ORAL_TABLET | Freq: Every day | ORAL | Status: DC
  Administered 2021-09-15 – 2021-09-18 (×4): 200 mg via ORAL
  Filled 2021-09-14 (×4): qty 1

## 2021-09-14 MED ORDER — ACETAMINOPHEN 650 MG RE SUPP: 650.0000 mg | Freq: Four times a day (QID) | RECTAL | Status: DC | PRN

## 2021-09-14 MED ORDER — FUROSEMIDE 10 MG/ML IJ SOLN
80.0000 mg | Freq: Once | INTRAMUSCULAR | Status: AC
Start: 2021-09-14 — End: 2021-09-14
  Administered 2021-09-14: 80 mg via INTRAVENOUS
  Filled 2021-09-14: qty 8

## 2021-09-14 MED ORDER — MOLNUPIRAVIR EUA 200MG CAPSULE
4.0000 | ORAL_CAPSULE | Freq: Two times a day (BID) | ORAL | Status: DC
  Administered 2021-09-15: 800 mg via ORAL
  Filled 2021-09-14 (×2): qty 4

## 2021-09-14 MED ORDER — ACETAMINOPHEN 325 MG PO TABS: 650.0000 mg | ORAL_TABLET | Freq: Four times a day (QID) | ORAL | Status: DC | PRN

## 2021-09-14 MED ORDER — DILTIAZEM HCL ER COATED BEADS 180 MG PO CP24
180.0000 mg | ORAL_CAPSULE | Freq: Every day | ORAL | Status: DC
  Administered 2021-09-15 – 2021-09-18 (×4): 180 mg via ORAL
  Filled 2021-09-14 (×4): qty 1

## 2021-09-14 MED ORDER — PANTOPRAZOLE SODIUM 40 MG PO TBEC
40.0000 mg | DELAYED_RELEASE_TABLET | Freq: Every day | ORAL | Status: DC
  Administered 2021-09-15 – 2021-09-18 (×4): 40 mg via ORAL
  Filled 2021-09-14 (×4): qty 1

## 2021-09-14 MED ORDER — TRAZODONE HCL 50 MG PO TABS: 25.0000 mg | ORAL_TABLET | Freq: Every evening | ORAL | Status: DC | PRN

## 2021-09-14 MED ORDER — INSULIN ASPART 100 UNIT/ML IJ SOLN
0.0000 [IU] | Freq: Three times a day (TID) | INTRAMUSCULAR | Status: DC
  Administered 2021-09-15 (×2): 3 [IU] via SUBCUTANEOUS
  Administered 2021-09-15 (×2): 5 [IU] via SUBCUTANEOUS
  Administered 2021-09-16: 3 [IU] via SUBCUTANEOUS
  Administered 2021-09-16: 8 [IU] via SUBCUTANEOUS
  Administered 2021-09-16: 11 [IU] via SUBCUTANEOUS
  Administered 2021-09-17: 07:00:00 3 [IU] via SUBCUTANEOUS

## 2021-09-14 MED ORDER — GLIPIZIDE 5 MG PO TABS
2.5000 mg | ORAL_TABLET | Freq: Two times a day (BID) | ORAL | Status: DC
  Administered 2021-09-15: 2.5 mg via ORAL
  Filled 2021-09-14: qty 0.5
  Filled 2021-09-14: qty 1

## 2021-09-14 MED ORDER — PREDNISONE 50 MG PO TABS: 50.0000 mg | ORAL_TABLET | Freq: Every day | ORAL | Status: DC

## 2021-09-14 MED ORDER — ONDANSETRON HCL 4 MG PO TABS: 4.0000 mg | ORAL_TABLET | Freq: Four times a day (QID) | ORAL | Status: DC | PRN

## 2021-09-14 MED ORDER — ATORVASTATIN CALCIUM 80 MG PO TABS
80.0000 mg | ORAL_TABLET | Freq: Every day | ORAL | Status: DC
  Administered 2021-09-15 – 2021-09-17 (×4): 80 mg via ORAL
  Filled 2021-09-14 (×4): qty 1

## 2021-09-14 MED ORDER — DEXTROSE 50 % IV SOLN
INTRAVENOUS | Status: AC
  Filled 2021-09-14: qty 50

## 2021-09-14 MED ORDER — FUROSEMIDE 10 MG/ML IJ SOLN
80.0000 mg | Freq: Two times a day (BID) | INTRAMUSCULAR | Status: DC
  Administered 2021-09-14 – 2021-09-16 (×4): 80 mg via INTRAVENOUS
  Filled 2021-09-14 (×4): qty 8

## 2021-09-14 MED ORDER — HYDROCOD POLI-CHLORPHE POLI ER 10-8 MG/5ML PO SUER: 5.0000 mL | Freq: Two times a day (BID) | ORAL | Status: DC | PRN

## 2021-09-14 MED ORDER — SENNA 8.6 MG PO TABS: 1.0000 | ORAL_TABLET | ORAL | Status: DC | PRN

## 2021-09-14 MED ORDER — POLYETHYLENE GLYCOL 3350 17 G PO PACK: 17.0000 g | PACK | Freq: Every day | ORAL | Status: DC | PRN

## 2021-09-14 MED ORDER — MAGNESIUM HYDROXIDE 400 MG/5ML PO SUSP: 30.0000 mL | Freq: Every day | ORAL | Status: DC | PRN

## 2021-09-14 MED ORDER — ONDANSETRON HCL 4 MG/2ML IJ SOLN
4.0000 mg | Freq: Four times a day (QID) | INTRAMUSCULAR | Status: DC | PRN
Start: 2021-09-14 — End: 2021-09-18

## 2021-09-14 MED ORDER — CITALOPRAM HYDROBROMIDE 20 MG PO TABS
20.0000 mg | ORAL_TABLET | Freq: Every day | ORAL | Status: DC
  Administered 2021-09-15 – 2021-09-18 (×4): 20 mg via ORAL
  Filled 2021-09-14 (×4): qty 1

## 2021-09-14 MED ORDER — DAPAGLIFLOZIN PROPANEDIOL 5 MG PO TABS
5.0000 mg | ORAL_TABLET | Freq: Every day | ORAL | Status: DC
  Administered 2021-09-15 – 2021-09-18 (×4): 5 mg via ORAL
  Filled 2021-09-14 (×4): qty 1

## 2021-09-14 MED ORDER — ZINC SULFATE 220 (50 ZN) MG PO CAPS
220.0000 mg | ORAL_CAPSULE | Freq: Every day | ORAL | Status: DC
  Administered 2021-09-14 – 2021-09-18 (×5): 220 mg via ORAL
  Filled 2021-09-14 (×5): qty 1

## 2021-09-14 MED ORDER — ASCORBIC ACID 500 MG PO TABS
500.0000 mg | ORAL_TABLET | Freq: Every day | ORAL | Status: DC
  Administered 2021-09-14 – 2021-09-15 (×2): 500 mg via ORAL
  Filled 2021-09-14 (×2): qty 1

## 2021-09-14 MED ORDER — TAMSULOSIN HCL 0.4 MG PO CAPS
0.4000 mg | ORAL_CAPSULE | Freq: Every day | ORAL | Status: DC
  Administered 2021-09-15 – 2021-09-18 (×4): 0.4 mg via ORAL
  Filled 2021-09-14 (×4): qty 1

## 2021-09-14 MED ORDER — METHYLPREDNISOLONE SODIUM SUCC 125 MG IJ SOLR
105.0000 mg | INTRAMUSCULAR | Status: DC
  Administered 2021-09-14: 105 mg via INTRAVENOUS
  Filled 2021-09-14: qty 2

## 2021-09-14 MED ORDER — DEXTROSE 50 % IV SOLN
25.0000 g | INTRAVENOUS | Status: AC
  Administered 2021-09-14: 25 g via INTRAVENOUS

## 2021-09-14 MED ORDER — VITAMIN B-12 1000 MCG PO TABS
500.0000 ug | ORAL_TABLET | Freq: Every day | ORAL | Status: DC
  Administered 2021-09-15 – 2021-09-18 (×4): 500 ug via ORAL
  Filled 2021-09-14 (×4): qty 1

## 2021-09-14 MED ORDER — GUAIFENESIN-DM 100-10 MG/5ML PO SYRP
10.0000 mL | ORAL_SOLUTION | ORAL | Status: DC | PRN
  Filled 2021-09-14: qty 10

## 2021-09-14 MED ORDER — LINAGLIPTIN 5 MG PO TABS
5.0000 mg | ORAL_TABLET | Freq: Every day | ORAL | Status: DC
  Administered 2021-09-15 – 2021-09-16 (×2): 5 mg via ORAL
  Filled 2021-09-14 (×3): qty 1

## 2021-09-14 MED ORDER — APIXABAN 2.5 MG PO TABS
2.5000 mg | ORAL_TABLET | Freq: Two times a day (BID) | ORAL | Status: DC
  Administered 2021-09-15 – 2021-09-18 (×8): 2.5 mg via ORAL
  Filled 2021-09-14 (×8): qty 1

## 2021-09-14 NOTE — Assessment & Plan Note (Addendum)
Patient continue sinus rhythm, with positive extra beats. ?He was continued with diltiazem and amiodarone for rate and rhythm control.  ?Anticoagulation with apixaban.  ?

## 2021-09-14 NOTE — Assessment & Plan Note (Addendum)
Patient was admitted to the cardiac ward and was placed on furosemide for diuresis.  ?Negative fluid balance was achieved with improvement of his symptoms.  ? ?Further work up with echocardiogram from 07/2021 his LV EF was preserved 55 to 60% with preserved RV systolic function. No significant valvular disease.  ? ?At discharge patient will resume oral torsemide 20 mg daily.  ?Continue with dapagliflizin.  ? ?

## 2021-09-14 NOTE — Assessment & Plan Note (Addendum)
Patient tested positive for COVID 19 on admission.  ?Recent infection in 08/2021, no clinical signs of active infection. ?No isolation was required.  ?

## 2021-09-14 NOTE — ED Provider Notes (Signed)
[facility] EMERGENCY DEPARTMENT Provider Note   CSN: [id number] Arrival date & time: 09/14/21  1158     History  No chief complaint on file.   Adam Reid is a 76 y.o. male.  Presenting to the emergency department with concern for shortness of breath.  Has been feeling more short of breath over the past few days.  Has been dealing with a cough previously but states more recently the cough is actually gotten better.  Breathing is worse with lying flat, worse with exertion.  No associated chest pain or difficulty in breathing.  History obtained from patient as well as wife at bedside.  Wife reports he has history of anemia and receives periodic injections.  History also obtained from chart review recently underwent right brachiocephalic AV fistula creation surgery on 2/24.   HPI     Home Medications Prior to Admission medications   Medication Sig Start Date End Date Taking? Authorizing Provider  amiodarone (PACERONE) 200 MG tablet Take 1 tablet (200 mg total) by mouth daily. Follow up appt required for further refills. Please call [phone] to schedule an appt 01/28/21   Bensimhon, Shaune Pascal, MD  atorvastatin (LIPITOR) 80 MG tablet TAKE 1 TABLET(80 MG) BY MOUTH DAILY Patient taking differently: Take 80 mg by mouth at bedtime. 12/22/20   [name], MD  blood glucose meter kit and supplies Use up to two times daily as directed  ICD10 E10.9 E11.9 02/20/19   [name], MD  Blood Glucose Monitoring Suppl [facility] VERIO) w/Device KIT as directed.  09/03/15   [provider]  citalopram (CELEXA) 20 MG tablet Take 20 mg by mouth daily.    [provider]  diltiazem (CARDIZEM CD) 180 MG 24 hr capsule TAKE 1 CAPSULE(180 MG) BY MOUTH DAILY 08/24/21   Bensimhon, Shaune Pascal, MD  ELIQUIS 2.5 MG TABS tablet TAKE 1 TABLET(2.5 MG) BY MOUTH TWICE DAILY 01/15/21   Bensimhon, Shaune Pascal, MD  FARXIGA 5 MG TABS tablet TAKE 1 TABLET(5 MG) BY MOUTH DAILY 04/16/21    [name], MD  ferric citrate (AURYXIA) 1 GM 210 MG(Fe) tablet Take 210 mg by mouth daily before supper.    [provider]  glipiZIDE (GLUCOTROL) 5 MG tablet TAKE 1/2 TABLET(2.5 MG) BY MOUTH TWICE DAILY BEFORE BREAKFAST AND DINNER 08/21/21   [name], MD  GlucoCom Lancets MISC Use for home glucose monitoring 09/02/15   Harrison Mons, PA  glucose blood (ONETOUCH VERIO) test strip Test up to 2 times per day.  Uncontrolled diabetes with hyperglycemia and stage 4 CKD. 04/13/19   [name], MD  oxyCODONE-acetaminophen (PERCOCET) 5-325 MG tablet Take 1 tablet by mouth every 6 (six) hours as needed for severe pain. 09/04/21 09/04/22  Baglia, Corrina, PA-C  pantoprazole (PROTONIX) 40 MG tablet TAKE 1 TABLET(40 MG) BY MOUTH DAILY 04/27/21   Levin Erp, PA  polyethylene glycol (MIRALAX / GLYCOLAX) 17 g packet Take 17 g by mouth daily as needed for mild constipation or moderate constipation.     [provider]  senna (SENOKOT) 8.6 MG tablet Take 1 tablet (8.6 mg total) by mouth as needed for constipation. 12/27/20   Ezekiel Slocumb, DO  sitaGLIPtin (JANUVIA) 25 MG tablet TAKE 1 TABLET(25 MG) BY MOUTH DAILY 08/31/21   [name], MD  tamsulosin (FLOMAX) 0.4 MG CAPS capsule TAKE 2 CAPSULES(0.8 MG) BY MOUTH DAILY Patient taking differently: Take 0.4 mg by mouth daily. 01/08/20   [name],  MD  torsemide (DEMADEX) 20 MG tablet Take 1 tablet (20 mg total) by mouth 2 (two) times daily. Temporary increase to 1 tablet twice daily until advised to decrease by his nephrologist. 08/28/21   [name], MD  vitamin B-12 (CYANOCOBALAMIN) 500 MCG tablet Take 500 mcg by mouth daily.    [provider]      Allergies    Patient has no known allergies.    Review of Systems   Review of Systems  Constitutional:  Negative for chills and fever.  HENT:  Negative for ear pain and sore throat.   Eyes:  Negative for pain and visual disturbance.   Respiratory:  Positive for shortness of breath. Negative for cough.   Cardiovascular:  Negative for chest pain and palpitations.  Gastrointestinal:  Negative for abdominal pain and vomiting.  Genitourinary:  Negative for dysuria and hematuria.  Musculoskeletal:  Negative for arthralgias and back pain.  Skin:  Negative for color change and rash.  Neurological:  Negative for seizures and syncope.  All other systems reviewed and are negative.  Physical Exam Updated Vital Signs BP (!) 141/68    Pulse 72    Temp 97.8 F (36.6 C) (Oral)    Resp 15    Ht $R'5\' 6"'or$  (1.676 m)    Wt 55.8 kg    SpO2 98%    BMI 19.86 kg/m  Physical Exam Vitals and nursing note reviewed.  Constitutional:      General: He is not in acute distress.    Appearance: He is well-developed.  HENT:     Head: Normocephalic and atraumatic.  Eyes:     Conjunctiva/sclera: Conjunctivae normal.  Cardiovascular:     Rate and Rhythm: Normal rate and regular rhythm.     Heart sounds: No murmur heard. Pulmonary:     Effort: Pulmonary effort is normal. No respiratory distress.     Breath sounds: Normal breath sounds.  Abdominal:     Palpations: Abdomen is soft.     Tenderness: There is no abdominal tenderness.  Musculoskeletal:        General: No swelling.     Cervical back: Neck supple.  Skin:    General: Skin is warm and dry.     Capillary Refill: Capillary refill takes less than 2 seconds.  Neurological:     General: No focal deficit present.     Mental Status: He is alert.  Psychiatric:        Mood and Affect: Mood normal.    ED Results / Procedures / Treatments   Labs (all labs ordered are listed, but only abnormal results are displayed) Labs Reviewed  RESP PANEL BY RT-PCR (FLU A&B, COVID) ARPGX2 - Abnormal; Notable for the following components:      Result Value   SARS Coronavirus 2 by RT PCR POSITIVE (*)    All other components within normal limits  CBC WITH DIFFERENTIAL/PLATELET - Abnormal; Notable for the  following components:   WBC 12.8 (*)    RBC 3.57 (*)    Hemoglobin 9.7 (*)    HCT 30.1 (*)    RDW 16.5 (*)    Neutro Abs 11.7 (*)    Lymphs Abs 0.3 (*)    All other components within normal limits  COMPREHENSIVE METABOLIC PANEL - Abnormal; Notable for the following components:   Sodium 134 (*)    CO2 17 (*)    Glucose, Bld 246 (*)    BUN 68 (*)    Creatinine, Ser  3.23 (*)    Calcium 8.6 (*)    Albumin 3.3 (*)    Alkaline Phosphatase 210 (*)    GFR, Estimated 19 (*)    All other components within normal limits  URINALYSIS, ROUTINE W REFLEX MICROSCOPIC - Abnormal; Notable for the following components:   Glucose, UA >=500 (*)    Hgb urine dipstick TRACE (*)    Protein, ur 100 (*)    All other components within normal limits  BRAIN NATRIURETIC PEPTIDE - Abnormal; Notable for the following components:   B Natriuretic Peptide 1,427.2 (*)    All other components within normal limits  URINALYSIS, MICROSCOPIC (REFLEX) - Abnormal; Notable for the following components:   Bacteria, UA RARE (*)    All other components within normal limits  TROPONIN I (HIGH SENSITIVITY) - Abnormal; Notable for the following components:   Troponin I (High Sensitivity) 44 (*)    All other components within normal limits  TROPONIN I (HIGH SENSITIVITY)    EKG None  Radiology DG Chest Port 1 View  Result Date: 09/14/2021 CLINICAL DATA:  Dyspnea, hypoxia EXAM: PORTABLE CHEST 1 VIEW COMPARISON:  08/28/2021 chest radiograph. FINDINGS: Stable cardiomediastinal silhouette with top-normal heart size. No pneumothorax. Small to moderate bilateral pleural effusions, chronic and similar. Hazy bibasilar lung opacities, similar. Streaky curvilinear opacities at the periphery of the mid lungs bilaterally, similar accounting for different technique. IMPRESSION: 1. Chronic small to moderate bilateral pleural effusions. 2. Hazy bibasilar lung opacities and streaky curvilinear opacities at the periphery of the mid lungs  bilaterally, favor scarring or atelectasis. Electronically Signed   By: Ilona Sorrel M.D.   On: 09/14/2021 13:14    Procedures .Critical Care Performed by: Lucrezia Starch, MD Authorized by: Lucrezia Starch, MD   Critical care provider statement:    Critical care time (minutes):  34   Critical care was necessary to treat or prevent imminent or life-threatening deterioration of the following conditions:  Respiratory failure and renal failure   Critical care was time spent personally by me on the following activities:  Development of treatment plan with patient or surrogate, discussions with consultants, evaluation of patient's response to treatment, examination of patient, ordering and review of laboratory studies, ordering and review of radiographic studies, ordering and performing treatments and interventions, pulse oximetry, re-evaluation of patient's condition and review of old charts    Medications Ordered in ED Medications  furosemide (LASIX) injection 80 mg (80 mg Intravenous Given 09/14/21 1355)    ED Course/ Medical Decision Making/ A&P                           Medical Decision Making Amount and/or Complexity of Data Reviewed Labs: ordered. Radiology: ordered.  Risk Prescription drug management. Decision regarding hospitalization.   76 year old gentleman with history of chronic kidney disease, diastolic heart failure, diabetes presenting to the emergency room with concern for shortness of breath.  Worse with exertion and lying flat.  On exam well-appearing in no distress at rest.  Mildly hypoxic with minimal exertion.  Placed on 2 L nasal cannula.  CXR with chronic small to moderate bilateral pleural effusions.  I independently reviewed today's chest x-ray and compared to prior CXR on 2/17, these appear grossly stable.  Anemia at baseline. CKD at baseline. BNP was profoundly elevated.  Patient endorsed changing his torsemide dose recently, down to 20 mg daily as opposed to  twice daily.  Suspect fluid overload, heart failure likely cause  for his symptoms.  I discussed the case with Dr. Justin Mend with nephrology.  He agrees with admission for IV diuresis, recommends 80 mg Lasix IV twice daily.  I discussed with Dr. Lorin Mercy with hospitalist service, she will admit for further management.  Patient's COVID test came back positive.  Given patient does not have other associated symptoms, unclear if this is incidental or causative of his symptoms today.  Will defer any COVID-specific treatment to admitting team.  Additional history obtained from chart review, review of past CXR, review of past PCP note, vascular surgery note.          Final Clinical Impression(s) / ED Diagnoses Final diagnoses:  COVID-19  Heart failure, unspecified HF chronicity, unspecified heart failure type ([place])  Hypoxia    Rx / DC Orders ED Discharge Orders     None         Lucrezia Starch, MD 09/14/21 1627

## 2021-09-14 NOTE — ED Triage Notes (Addendum)
Sob x 3 days. Wife states his HGB is low. He looks sick. After walking from the waiting area to triage, pts o2sat was 88%. ?

## 2021-09-14 NOTE — ED Notes (Signed)
Completed ambulation order- PT Sp02 on RA while ambulating 87% with mild SOB (MD and RN aware). PT now resides in room and is currently on 2 LPM nasal cannula with Sp02 93% (WOB has diminished). ?

## 2021-09-14 NOTE — Assessment & Plan Note (Addendum)
No clinical signs of urinary retention continue with tamsulosin.  ?

## 2021-09-14 NOTE — Assessment & Plan Note (Signed)
-   We will continue statin therapy. 

## 2021-09-14 NOTE — Plan of Care (Signed)
  Problem: Education: Goal: Ability to demonstrate management of disease process will improve Outcome: Progressing Goal: Ability to verbalize understanding of medication therapies will improve Outcome: Progressing   

## 2021-09-14 NOTE — Assessment & Plan Note (Addendum)
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  ?Hyponatremia.  ?Non anion gap metabolic acidosis.  ? ?His renal function was closely monitored.  ? ?His peak creatinine reached 4,15. ?Diuresis was held with improvement on serum cr, at the time of his discharge is 3,95 with K od 3,7 and serum bicarbonate at 19. P 5.2  ? ?Patient will be discharge home with oral torsemide 20 mg daily. ?Oral sodium bicarbonate and phoslo.  ?Follow up with Dr [name] from nephrology as outpatient.  ?

## 2021-09-14 NOTE — Assessment & Plan Note (Addendum)
Continue with diltiazem.  ?At discharge will resume taking torsemide for diuresis.   ?

## 2021-09-14 NOTE — Assessment & Plan Note (Addendum)
Uncontrolled diabetes with hyperglycemia/ hypoglycemia.  ? ?Patient was placed on insulin therapy for glucose control, sliding scale. ?Linagliptin and glipizide were held due to hypoglycemia. ?At the time of his discharge his fasting glucose is 220. ?He will resume sitagliptin but will recommend to hold on glipizide to prevent hypoglycemia.  ?

## 2021-09-14 NOTE — H&P (Signed)
[place]   PATIENT NAME: Adam Reid    MR#:  [id number]  DATE OF BIRTH:  1945-12-31  DATE OF ADMISSION:  09/14/2021  PRIMARY CARE PHYSICIAN: [name], MD   Patient is coming from: Home  REQUESTING/REFERRING PHYSICIAN: [facility] P: Lucrezia Starch, MD   CHIEF COMPLAINT:  Shortness of breath  HISTORY OF PRESENT ILLNESS:  Adam Reid is a 76 y.o. 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  He admits to orthopnea and dyspnea on exertion.  He recently cut down his torsemide to 20 mg p.o. daily from twice daily.  No chest pain or palpitations.  No reported fever or chills.  No nausea or vomiting or abdominal pain.  No bleeding diathesis.  His case was discussed with Dr. Justin Mend with nephrology who recommended IV Lasix diuresis with 80 mg twice daily.  ED Course: When he came to the ER BP was 146/76 with respiratory rate of 32 and otherwise normal vital signs.  Latest BP here was 145/78 with respiratory rate of 21 and Pulsoxymeter was 100% on 3 L of O2 by nasal cannula.  Labs revealed mild hyponatremia 134 and a CO2 of 17 with a blood glucose of 246.  BUN of 68 and creatinine 3.23 compared to 58/4 on 08/31/2021.  Calcium was 8.6 and alk phos 210 with albumin of 3.3.  BNP was 1427.2 and high-sensitivity troponin was 44 and later 41.  CBC showed leukocytosis 12.8 with neutrophilia as well as anemia close to baseline.  Influenza antigens came back negative and COVID-19 PCR came back positive.  UA showed more than 500 glucose and trace hemoglobin with 100 protein and rare bacteria, 0-5 WBCs and 0-5 RBCs. EKG as reviewed by me : EKG showed  normal sinus rhythm with rate of 75 with PACs and poor R wave progression with prolonged QT interval with QTc of 512 MS Imaging: Chest x-ray showed a chronic small to moderate bilateral pleural effusions and hazy bibasilar lung opacities and streaky curvilinear opacities at the periphery of the mid lungs bilaterally favoring scarring or atelectasis.  The patient was given 80 mg IV Lasix.  He is directly admitted to a cardiac telemetry observation bed for further evaluation and management. PAST MEDICAL HISTORY:   Past Medical History:  Diagnosis Date   Anemia    CHF (congestive heart failure) (HCC)    Chronic kidney disease    Depression    Diabetes mellitus without complication (HCC)    Type II   GERD (gastroesophageal reflux disease)    History of blood transfusion    Hyperlipidemia    Hypertension    Thyroid disease    was on supplement, taken off by [name]    PAST SURGICAL HISTORY:   Past Surgical History:  Procedure Laterality Date   APPENDECTOMY     AV FISTULA PLACEMENT Right 09/04/2021   Procedure: RIGHT BRACHIOCEPHALIC ARTERIOVENOUS (AV) FISTULA CREATION;  Surgeon: [name], MD;  Location: [place];  Service: Vascular;  Laterality: Right;   BIOPSY  12/25/2020   Procedure: BIOPSY;  Surgeon: [name]., MD;  Location: [facility] ENDOSCOPY;  Service: Gastroenterology;;   CARDIAC CATHETERIZATION N/A  01/30/2016   Procedure: Left Heart Cath and Coronary Angiography;  Surgeon: [name], MD;  Location: [place] CV LAB;  Service: Cardiovascular;  Laterality: N/A;   ENTEROSCOPY N/A 12/25/2020   Procedure: ENTEROSCOPY;  Surgeon: [name], MD;  Location: [place];  Service: Gastroenterology;  Laterality: N/A;   GIVENS CAPSULE STUDY N/A 12/25/2020   Procedure: GIVENS CAPSULE STUDY;  Surgeon: [name]., MD;  Location: [place];  Service: Gastroenterology;  Laterality: N/A;   SUBMUCOSAL TATTOO INJECTION  12/25/2020    Procedure: SUBMUCOSAL TATTOO INJECTION;  Surgeon: [name], MD;  Location: [facility] ENDOSCOPY;  Service: Gastroenterology;;    SOCIAL HISTORY:   Social History   Tobacco Use   Smoking status: Light Smoker    Packs/day: 0.10    Years: 48.00    Pack years: 4.80    Types: Cigarettes    Last attempt to quit: 07/16/2019    Years since quitting: 2.1   Smokeless tobacco: Never   Tobacco comments:    09/03/22 Wife states, "he hides from me, so I am not sure how much he smokes."  Substance Use Topics   Alcohol use: No    Alcohol/week: 0.0 standard drinks    FAMILY HISTORY:   Family History  Problem Relation Age of Onset   Hypertension Mother    Hyperlipidemia Mother    Hypertension Sister    Kidney disease Father    Heart disease Brother 20       open heart surgery   Colon cancer Neg Hx     DRUG ALLERGIES:  No Known Allergies  REVIEW OF SYSTEMS:   ROS As per history of present illness. All pertinent systems were reviewed above. Constitutional, HEENT, cardiovascular, respiratory, GI, GU, musculoskeletal, neuro, psychiatric, endocrine, integumentary and hematologic systems were reviewed and are otherwise negative/unremarkable except for positive findings mentioned above in the HPI.   MEDICATIONS AT HOME:   Prior to Admission medications   Medication Sig Start Date End Date Taking? Authorizing Provider  amiodarone (PACERONE) 200 MG tablet Take 1 tablet (200 mg total) by mouth daily. Follow up appt required for further refills. Please call [phone] to schedule an appt 01/28/21   Bensimhon, Shaune Pascal, MD  atorvastatin (LIPITOR) 80 MG tablet TAKE 1 TABLET(80 MG) BY MOUTH DAILY Patient taking differently: Take 80 mg by mouth at bedtime. 12/22/20   [name], MD  blood glucose meter kit and supplies Use up to two times daily as directed  ICD10 E10.9 E11.9 02/20/19   [name], MD  Blood Glucose Monitoring Suppl [facility] VERIO) w/Device KIT as directed.   09/03/15   [provider]  citalopram (CELEXA) 20 MG tablet Take 20 mg by mouth daily.    [provider]  diltiazem (CARDIZEM CD) 180 MG 24 hr capsule TAKE 1 CAPSULE(180 MG) BY MOUTH DAILY 08/24/21   Bensimhon, Shaune Pascal, MD  ELIQUIS 2.5 MG TABS tablet TAKE 1 TABLET(2.5 MG) BY MOUTH TWICE DAILY 01/15/21   Bensimhon, Shaune Pascal, MD  FARXIGA 5 MG TABS tablet TAKE 1 TABLET(5 MG) BY MOUTH DAILY 04/16/21   [name], MD  ferric citrate (AURYXIA) 1 GM 210 MG(Fe) tablet Take 210 mg by mouth daily before supper.    [provider]  glipiZIDE (GLUCOTROL) 5 MG tablet TAKE 1/2 TABLET(2.5 MG) BY MOUTH TWICE DAILY BEFORE BREAKFAST AND DINNER 08/21/21   [name], MD  GlucoCom Lancets MISC Use for home glucose monitoring 09/02/15   Harrison Mons,  PA  glucose blood (ONETOUCH VERIO) test strip Test up to 2 times per day.  Uncontrolled diabetes with hyperglycemia and stage 4 CKD. 04/13/19   [name], MD  oxyCODONE-acetaminophen (PERCOCET) 5-325 MG tablet Take 1 tablet by mouth every 6 (six) hours as needed for severe pain. 09/04/21 09/04/22  Baglia, Corrina, PA-C  pantoprazole (PROTONIX) 40 MG tablet TAKE 1 TABLET(40 MG) BY MOUTH DAILY 04/27/21   Levin Erp, PA  polyethylene glycol (MIRALAX / GLYCOLAX) 17 g packet Take 17 g by mouth daily as needed for mild constipation or moderate constipation.     [provider]  senna (SENOKOT) 8.6 MG tablet Take 1 tablet (8.6 mg total) by mouth as needed for constipation. 12/27/20   Ezekiel Slocumb, DO  sitaGLIPtin (JANUVIA) 25 MG tablet TAKE 1 TABLET(25 MG) BY MOUTH DAILY 08/31/21   [name], MD  tamsulosin (FLOMAX) 0.4 MG CAPS capsule TAKE 2 CAPSULES(0.8 MG) BY MOUTH DAILY Patient taking differently: Take 0.4 mg by mouth daily. 01/08/20   [name], MD  torsemide (DEMADEX) 20 MG tablet Take 1 tablet (20 mg total) by mouth 2 (two) times daily. Temporary increase to 1 tablet twice daily until  advised to decrease by his nephrologist. 08/28/21   [name], MD  vitamin B-12 (CYANOCOBALAMIN) 500 MCG tablet Take 500 mcg by mouth daily.    [provider]      VITAL SIGNS:  Blood pressure (!) 145/78, pulse 82, temperature 98.3 F (36.8 C), temperature source Oral, resp. rate (!) 21, height $RemoveBe'5\' 6"'GUqYsJKsH$  (1.676 m), weight 53.8 kg, SpO2 100 %.  PHYSICAL EXAMINATION:  Physical Exam  GENERAL:  76 y.o.-year-old male patient lying in the bed with no acute distress.  EYES: Pupils equal, round, reactive to light and accommodation. No scleral icterus. Extraocular muscles intact.  HEENT: Head atraumatic, normocephalic. Oropharynx and nasopharynx clear.  NECK:  Supple, no jugular venous distention. No thyroid enlargement, no tenderness.  LUNGS: Diminished bibasilar breath sounds with bibasal rales.  No use of accessory muscles of respiration.  CARDIOVASCULAR: Regular rate and rhythm, S1, S2 normal. No murmurs, rubs, or gallops.  ABDOMEN: Soft, nondistended, nontender. Bowel sounds present. No organomegaly or mass.  EXTREMITIES: No pedal edema, cyanosis, or clubbing.  NEUROLOGIC: Cranial nerves II through XII are intact. Muscle strength 5/5 in all extremities. Sensation intact. Gait not checked.  PSYCHIATRIC: The patient is alert and oriented x 3.  Normal affect and good eye contact. SKIN: No obvious rash, lesion, or ulcer.   LABORATORY PANEL:   CBC Recent Labs  Lab 09/14/21 1210  WBC 12.8*  HGB 9.7*  HCT 30.1*  PLT 367   ------------------------------------------------------------------------------------------------------------------  Chemistries  Recent Labs  Lab 09/14/21 1210  NA 134*  K 4.2  CL 102  CO2 17*  GLUCOSE 246*  BUN 68*  CREATININE 3.23*  CALCIUM 8.6*  AST 29  ALT 29  ALKPHOS 210*  BILITOT 0.6   ------------------------------------------------------------------------------------------------------------------  Cardiac Enzymes No results for  input(s): TROPONINI in the last 168 hours. ------------------------------------------------------------------------------------------------------------------  RADIOLOGY:  DG Chest Port 1 View  Result Date: 09/14/2021 CLINICAL DATA:  Dyspnea, hypoxia EXAM: PORTABLE CHEST 1 VIEW COMPARISON:  08/28/2021 chest radiograph. FINDINGS: Stable cardiomediastinal silhouette with top-normal heart size. No pneumothorax. Small to moderate bilateral pleural effusions, chronic and similar. Hazy bibasilar lung opacities, similar. Streaky curvilinear opacities at the periphery of the mid lungs bilaterally, similar accounting for different technique. IMPRESSION: 1. Chronic small to moderate bilateral pleural effusions. 2.  Hazy bibasilar lung opacities and streaky curvilinear opacities at the periphery of the mid lungs bilaterally, favor scarring or atelectasis. Electronically Signed   By: Ilona Sorrel M.D.   On: 09/14/2021 13:14      IMPRESSION AND PLAN:  Assessment and Plan: * Acute on chronic diastolic CHF (congestive heart failure) ([place]) - The patient will be admitted to a cardiac telemetry observation bed. - He will be diuresed with IV Lasix 80 mg every 12 hours. - Nephrology consult will be obtained. - This can be called in AM. - The case was discussed with Dr. Justin Mend as mentioned above. - We will follow serial troponins. - The patient had a 2D echo on 07/15/2021 with EF of 55 to 60% with mild concentric LVH and mild mitral valve regurgitation. - Continue Farxiga.  COVID-19 virus infection - We will place the patient on p.o. molnupiravir. - We will follow inflammatory markers. - IV steroid therapy will be started given his hypoxemia. - He will be placed on vitamin C, vitamin D3 and zinc sulfate. - We will continue his Eliquis.  Acute respiratory failure with hypoxia (HCC) - This is likely secondary mainly to acute CHF and possibly to COVID-19. - O2 protocol will be followed. - We will add IV steroid  therapy given her COVID-19 infection  Hyperlipidemia LDL goal <100 - We will continue statin therapy.  Paroxysmal A-fib (HCC) - We will continue amiodarone and Eliquis. - We will continue Cardizem CD.  HTN (hypertension) - We will continue Cardizem CD.  Diabetes mellitus with renal manifestations, controlled ([place]) - We will continue glipizide and Januvia. - We will place the patient on supplement coverage with NovoLog.  Chronic kidney disease (CKD), stage V ([place]) - The patient had an AV fistula just placed. - Nephrology consult will be obtained. - This can be called in AM. - Dr. Justin Mend is aware about the patient.  BPH (benign prostatic hyperplasia) We will continue Flomax.     DVT prophylaxis: Eliquis. Advanced Care Planning:  Code Status: full code. Family Communication:  The plan of care was discussed in details with the patient (and family). I answered all questions. The patient agreed to proceed with the above mentioned plan. Further management will depend upon hospital course. Disposition Plan: Back to previous home environment Consults called: Nephrology.  All the records are reviewed and case discussed with ED provider.  Status is: Observation  I certify that at the time of admission, it is my clinical judgment that the patient will require inpatient hospital care extending less than 2 midnights.                            Dispo: The patient is from: Home              Anticipated d/c is to: Home              Patient currently is not medically stable to d/c.              Difficult to place patient: No  Christel Mormon M.D on 09/14/2021 at 10:41 PM  [facility]   From 7 PM-7 AM, contact night-coverage [email or web address]  CC: Primary care physician; [name], MD

## 2021-09-14 NOTE — ED Notes (Signed)
Assessed PT for shortness of breath, while on RA. PT has a mild work of breathing, BBS are clear (except mild, fine crackles in RUL and RML). Sp02 91% on RA (no supplemental 02 usage at home), placing PT on 2 LPM nasal cannula. RN aware. ?

## 2021-09-14 NOTE — Assessment & Plan Note (Addendum)
Patient with improved volume status. ?Oxymetry is 98% on room air. ?Respiratory failure has resolved.  ?

## 2021-09-14 NOTE — Progress Notes (Signed)
Plan of Care Note for accepted transfer ? ? ?Patient: Adam Reid MRN: [id number]   DOA: 09/14/2021 ? ?Facility requesting transfer: [facility] ?Requesting Provider: Roslynn Amble ?Reason for transfer: Volume overload ? ?Facility course: Patient with h/o chronic diastolic CHF; stage 4 CKD with fistula placement on 2/24; DM; HTN; HLD; and hypothyroidism presenting with SOB.  He was previously on torsemide with decreased dose and he has had progressive SOB/DOE.  He is hypoxic with ambulation to 87%.  Small to moderate pleural effusions.  BNP 1500, up from 600.  Appears to be volume overloaded and likely to benefit from diuresis.  Dr. Justin Mend agrees with diuresis, Lasix 80 mg IV BID.  Will accept to [facility] in case his renal function worsens to the point of needing HD. ? ? ? ?Plan of care: ?The patient is accepted for admission to Telemetry unit, at [facility]. ? ? ?Author: ?Karmen Bongo, MD ?09/14/2021 ? ?Check [email or web address] for on-call coverage. ? ?Nursing staff, Please call [place] number on Amion as soon as patient's arrival, so appropriate admitting provider can evaluate the pt. ?

## 2021-09-15 DIAGNOSIS — E039 Hypothyroidism, unspecified: Secondary | ICD-10-CM | POA: Diagnosis present

## 2021-09-15 DIAGNOSIS — N185 Chronic kidney disease, stage 5: Secondary | ICD-10-CM | POA: Diagnosis present

## 2021-09-15 DIAGNOSIS — K219 Gastro-esophageal reflux disease without esophagitis: Secondary | ICD-10-CM | POA: Diagnosis present

## 2021-09-15 DIAGNOSIS — E1165 Type 2 diabetes mellitus with hyperglycemia: Secondary | ICD-10-CM | POA: Diagnosis present

## 2021-09-15 DIAGNOSIS — Z7901 Long term (current) use of anticoagulants: Secondary | ICD-10-CM | POA: Diagnosis not present

## 2021-09-15 DIAGNOSIS — F17210 Nicotine dependence, cigarettes, uncomplicated: Secondary | ICD-10-CM | POA: Diagnosis present

## 2021-09-15 DIAGNOSIS — E871 Hypo-osmolality and hyponatremia: Secondary | ICD-10-CM | POA: Diagnosis present

## 2021-09-15 DIAGNOSIS — D509 Iron deficiency anemia, unspecified: Secondary | ICD-10-CM | POA: Diagnosis present

## 2021-09-15 DIAGNOSIS — F32A Depression, unspecified: Secondary | ICD-10-CM | POA: Diagnosis present

## 2021-09-15 DIAGNOSIS — E11649 Type 2 diabetes mellitus with hypoglycemia without coma: Secondary | ICD-10-CM | POA: Diagnosis not present

## 2021-09-15 DIAGNOSIS — I132 Hypertensive heart and chronic kidney disease with heart failure and with stage 5 chronic kidney disease, or end stage renal disease: Secondary | ICD-10-CM | POA: Diagnosis present

## 2021-09-15 DIAGNOSIS — R0600 Dyspnea, unspecified: Secondary | ICD-10-CM | POA: Diagnosis present

## 2021-09-15 DIAGNOSIS — J9601 Acute respiratory failure with hypoxia: Secondary | ICD-10-CM | POA: Diagnosis present

## 2021-09-15 DIAGNOSIS — Z7984 Long term (current) use of oral hypoglycemic drugs: Secondary | ICD-10-CM | POA: Diagnosis not present

## 2021-09-15 DIAGNOSIS — N400 Enlarged prostate without lower urinary tract symptoms: Secondary | ICD-10-CM | POA: Diagnosis present

## 2021-09-15 DIAGNOSIS — E872 Acidosis, unspecified: Secondary | ICD-10-CM | POA: Diagnosis present

## 2021-09-15 DIAGNOSIS — E785 Hyperlipidemia, unspecified: Secondary | ICD-10-CM | POA: Diagnosis present

## 2021-09-15 DIAGNOSIS — I480 Paroxysmal atrial fibrillation: Secondary | ICD-10-CM | POA: Diagnosis present

## 2021-09-15 DIAGNOSIS — Z79899 Other long term (current) drug therapy: Secondary | ICD-10-CM | POA: Diagnosis not present

## 2021-09-15 DIAGNOSIS — I5033 Acute on chronic diastolic (congestive) heart failure: Secondary | ICD-10-CM | POA: Diagnosis present

## 2021-09-15 DIAGNOSIS — D631 Anemia in chronic kidney disease: Secondary | ICD-10-CM | POA: Diagnosis present

## 2021-09-15 DIAGNOSIS — E1122 Type 2 diabetes mellitus with diabetic chronic kidney disease: Secondary | ICD-10-CM | POA: Diagnosis present

## 2021-09-15 DIAGNOSIS — Z8616 Personal history of COVID-19: Secondary | ICD-10-CM | POA: Diagnosis not present

## 2021-09-15 DIAGNOSIS — Z8249 Family history of ischemic heart disease and other diseases of the circulatory system: Secondary | ICD-10-CM | POA: Diagnosis not present

## 2021-09-15 DIAGNOSIS — N179 Acute kidney failure, unspecified: Secondary | ICD-10-CM | POA: Diagnosis present

## 2021-09-15 LAB — CBC WITH DIFFERENTIAL/PLATELET
Abs Immature Granulocytes: 0.07 10*3/uL (ref 0.00–0.07)
Basophils Absolute: 0 10*3/uL (ref 0.0–0.1)
Basophils Relative: 0 %
Eosinophils Absolute: 0 10*3/uL (ref 0.0–0.5)
Eosinophils Relative: 0 %
HCT: 27.9 % — ABNORMAL LOW (ref 39.0–52.0)
Hemoglobin: 9 g/dL — ABNORMAL LOW (ref 13.0–17.0)
Immature Granulocytes: 1 %
Lymphocytes Relative: 1 %
Lymphs Abs: 0.2 10*3/uL — ABNORMAL LOW (ref 0.7–4.0)
MCH: 27 pg (ref 26.0–34.0)
MCHC: 32.3 g/dL (ref 30.0–36.0)
MCV: 83.8 fL (ref 80.0–100.0)
Monocytes Absolute: 0.2 10*3/uL (ref 0.1–1.0)
Monocytes Relative: 1 %
Neutro Abs: 13.3 10*3/uL — ABNORMAL HIGH (ref 1.7–7.7)
Neutrophils Relative %: 97 %
Platelets: 355 10*3/uL (ref 150–400)
RBC: 3.33 MIL/uL — ABNORMAL LOW (ref 4.22–5.81)
RDW: 16.3 % — ABNORMAL HIGH (ref 11.5–15.5)
WBC: 13.8 10*3/uL — ABNORMAL HIGH (ref 4.0–10.5)
nRBC: 0 % (ref 0.0–0.2)

## 2021-09-15 LAB — COMPREHENSIVE METABOLIC PANEL
ALT: 25 U/L (ref 0–44)
AST: 18 U/L (ref 15–41)
Albumin: 3 g/dL — ABNORMAL LOW (ref 3.5–5.0)
Alkaline Phosphatase: 195 U/L — ABNORMAL HIGH (ref 38–126)
Anion gap: 14 (ref 5–15)
BUN: 67 mg/dL — ABNORMAL HIGH (ref 8–23)
CO2: 16 mmol/L — ABNORMAL LOW (ref 22–32)
Calcium: 8.4 mg/dL — ABNORMAL LOW (ref 8.9–10.3)
Chloride: 101 mmol/L (ref 98–111)
Creatinine, Ser: 3.41 mg/dL — ABNORMAL HIGH (ref 0.61–1.24)
GFR, Estimated: 18 mL/min — ABNORMAL LOW (ref >60)
Glucose, Bld: 274 mg/dL — ABNORMAL HIGH (ref 70–99)
Potassium: 3.9 mmol/L (ref 3.5–5.1)
Sodium: 131 mmol/L — ABNORMAL LOW (ref 135–145)
Total Bilirubin: 0.6 mg/dL (ref 0.3–1.2)
Total Protein: 7.5 g/dL (ref 6.5–8.1)

## 2021-09-15 LAB — GLUCOSE, CAPILLARY
Glucose-Capillary: 194 mg/dL — ABNORMAL HIGH (ref 70–99)
Glucose-Capillary: 195 mg/dL — ABNORMAL HIGH (ref 70–99)
Glucose-Capillary: 213 mg/dL — ABNORMAL HIGH (ref 70–99)
Glucose-Capillary: 222 mg/dL — ABNORMAL HIGH (ref 70–99)
Glucose-Capillary: 275 mg/dL — ABNORMAL HIGH (ref 70–99)

## 2021-09-15 LAB — PROCALCITONIN: Procalcitonin: 0.83 ng/mL

## 2021-09-15 LAB — C-REACTIVE PROTEIN: CRP: 12.3 mg/dL — ABNORMAL HIGH (ref <1.0)

## 2021-09-15 LAB — FERRITIN: Ferritin: 834 ng/mL — ABNORMAL HIGH (ref 24–336)

## 2021-09-15 MED ORDER — SODIUM BICARBONATE 650 MG PO TABS
650.0000 mg | ORAL_TABLET | Freq: Two times a day (BID) | ORAL | Status: DC
Start: 2021-09-15 — End: 2021-09-18
  Administered 2021-09-15 – 2021-09-18 (×6): 650 mg via ORAL
  Filled 2021-09-15 (×6): qty 1

## 2021-09-15 NOTE — Consult Note (Signed)
[facility] Renal Consultation Note  Requesting MD: Domenic Polite, MD  Indication for Consultation: Volume management in the setting of CHF exacerbation and CKD-V  HPI:  Adam Reid is a 76 y.o. male with pmhx significant for CKD-V, HFpEF, T2DM,  HTN, HLD who presented to the ED on 3/6 for acute on chronic HF exacerbation. He is currently on the verge of needing hemodialysis (s/p right brachiocephalic AV fistula on 7/35; followed by Dr. [name]). Baseline Cr of 3.5. In the ED, BNP of 1,427 with associated hypoxemia, orthopnea and DOE. CXR significant for small to moderate BL pleural effusions. Nephrology has been asked to follow for volume management.    He is currently s/p 2 doses of 13m IV Lasix (scheduled BID). He appears to be responding well to this diuresis. UOP 1,1049m Symptoms are improving overall but continues to endorse orthopnea, cough. Denies fever, chills, hematuria, difficulty breathing, chest pain, palpitations.   Sating 99 on RA. Afebrile. BP 132/53. Wgt 53.8kg (down from 55.8kg).  Sodium 131 potassium 3.9 chloride 101 CO2 16 BUN 67 creatinine 3.41 glucose 274 Corrected calcium 9.2   Creat  Date/Time Value Ref Range Status  03/26/2016 06:39 PM 2.68 (H) 0.70 - 1.25 mg/dL Final    Comment:      For patients > or = 5068ears of age: The upper reference limit for Creatinine is approximately 13% higher for people identified as African-American.     09/02/2015 02:41 PM 2.23 (H) 0.70 - 1.25 mg/dL Final  03/23/2015 01:45 PM 1.77 (H) 0.70 - 1.25 mg/dL Final  09/20/2014 12:53 PM 1.71 (H) 0.50 - 1.35 mg/dL Final  06/24/2014 02:17 PM 1.47 (H) 0.50 - 1.35 mg/dL Final  04/30/2014 01:44 PM 1.80 (H) 0.50 - 1.35 mg/dL Final  10/19/2013 01:32 PM 1.37 (H) 0.50 - 1.35 mg/dL Final  08/08/2013 01:03 PM 1.34 0.50 - 1.35 mg/dL Final   Creatinine, Ser  Date/Time Value Ref Range Status  09/15/2021 12:58 AM 3.41 (H) 0.61 - 1.24 mg/dL Final  09/14/2021 12:10 PM 3.23 (H)  0.61 - 1.24 mg/dL Final  09/04/2021 08:20 AM 4.00 (H) 0.61 - 1.24 mg/dL Final  08/14/2021 01:11 PM 3.69 (H) 0.61 - 1.24 mg/dL Final  04/24/2021 01:30 PM 3.51 (H) 0.61 - 1.24 mg/dL Final  01/07/2021 12:33 PM 3.62 (H) 0.40 - 1.50 mg/dL Final  01/02/2021 10:51 AM 3.38 (H) 0.61 - 1.24 mg/dL Final  12/26/2020 03:34 AM 3.51 (H) 0.61 - 1.24 mg/dL Final  12/25/2020 05:10 AM 3.36 (H) 0.61 - 1.24 mg/dL Final  12/24/2020 07:29 AM 3.74 (H) 0.61 - 1.24 mg/dL Final  12/23/2020 03:24 PM 3.72 (H) 0.61 - 1.24 mg/dL Final  12/22/2020 03:24 PM 3.76 (H) 0.40 - 1.50 mg/dL Final  08/15/2020 10:03 AM 3.31 (H) 0.61 - 1.24 mg/dL Final  06/19/2020 09:36 AM 3.16 (HH) 0.76 - 1.27 mg/dL Final    Comment:                      Client Requested Flag  03/28/2020 12:14 PM 3.40 (H) 0.61 - 1.24 mg/dL Final  03/07/2020 10:31 AM 3.33 (H) 0.61 - 1.24 mg/dL Final  11/05/2019 12:30 PM 2.52 (H) 0.61 - 1.24 mg/dL Final  10/03/2019 08:36 AM 3.05 (H) 0.76 - 1.27 mg/dL Final  09/26/2019 09:16 AM 2.70 (H) 0.61 - 1.24 mg/dL Final  09/25/2019 05:45 AM 2.49 (H) 0.61 - 1.24 mg/dL Final  09/24/2019 05:01 AM 2.39 (H) 0.61 - 1.24 mg/dL Final  09/23/2019 11:38 PM 2.45 (H) 0.61 -  1.24 mg/dL Final  09/17/2019 05:27 PM 2.24 (H) 0.76 - 1.27 mg/dL Final  09/14/2019 03:55 PM 2.25 (H) 0.76 - 1.27 mg/dL Final  09/03/2019 09:04 AM 2.36 (H) 0.76 - 1.27 mg/dL Final  08/27/2019 09:43 AM 1.99 (H) 0.76 - 1.27 mg/dL Final  08/16/2019 02:44 PM 2.18 (H) 0.76 - 1.27 mg/dL Final  07/20/2019 02:06 PM 2.47 (H) 0.76 - 1.27 mg/dL Final  12/19/2018 03:30 PM 2.44 (H) 0.76 - 1.27 mg/dL Final  06/20/2018 05:19 PM 2.15 (H) 0.76 - 1.27 mg/dL Final  01/23/2018 04:21 PM 2.22 (H) 0.76 - 1.27 mg/dL Final  10/25/2017 04:50 PM 2.56 (H) 0.76 - 1.27 mg/dL Final  10/05/2017 02:33 PM 1.98 (H) 0.76 - 1.27 mg/dL Final  03/19/2017 05:12 PM 2.59 (H) 0.76 - 1.27 mg/dL Final  12/14/2016 11:40 AM 2.74 (H) 0.76 - 1.27 mg/dL Final  01/31/2016 03:58 AM 1.92 (H) 0.61 - 1.24 mg/dL  Final  01/30/2016 04:51 AM 2.27 (H) 0.61 - 1.24 mg/dL Final  01/29/2016 04:49 PM 2.52 (H) 0.61 - 1.24 mg/dL Final  12/03/2015 04:40 AM 1.97 (H) 0.61 - 1.24 mg/dL Final  12/02/2015 11:27 AM 2.14 (H) 0.61 - 1.24 mg/dL Final     PMHx:   Past Medical History:  Diagnosis Date   Anemia    CHF (congestive heart failure) ([place])    Chronic kidney disease    Depression    Diabetes mellitus without complication (HCC)    Type II   GERD (gastroesophageal reflux disease)    History of blood transfusion    Hyperlipidemia    Hypertension    Thyroid disease    was on supplement, taken off by [name]    Past Surgical History:  Procedure Laterality Date   APPENDECTOMY     AV FISTULA PLACEMENT Right 09/04/2021   Procedure: RIGHT BRACHIOCEPHALIC ARTERIOVENOUS (AV) FISTULA CREATION;  Surgeon: [name], MD;  Location: [place];  Service: Vascular;  Laterality: Right;   BIOPSY  12/25/2020   Procedure: BIOPSY;  Surgeon: [name]., MD;  Location: [facility] ENDOSCOPY;  Service: Gastroenterology;;   CARDIAC CATHETERIZATION N/A 01/30/2016   Procedure: Left Heart Cath and Coronary Angiography;  Surgeon: [name], MD;  Location: [facility];  Service: Cardiovascular;  Laterality: N/A;   ENTEROSCOPY N/A 12/25/2020   Procedure: ENTEROSCOPY;  Surgeon: [name], MD;  Location: [place];  Service: Gastroenterology;  Laterality: N/A;   GIVENS CAPSULE STUDY N/A 12/25/2020   Procedure: GIVENS CAPSULE STUDY;  Surgeon: [name]., MD;  Location: [place];  Service: Gastroenterology;  Laterality: N/A;   SUBMUCOSAL TATTOO INJECTION  12/25/2020   Procedure: SUBMUCOSAL TATTOO INJECTION;  Surgeon: [name], MD;  Location: [facility] ENDOSCOPY;  Service: Gastroenterology;;    Family Hx:  Family History  Problem Relation Age of Onset   Hypertension Mother    Hyperlipidemia Mother    Hypertension Sister    Kidney disease Father    Heart disease  Brother 89       open heart surgery   Colon cancer Neg Hx     Social History:  reports that he has been smoking cigarettes. He has a 4.80 pack-year smoking history. He has never used smokeless tobacco. He reports that he does not drink alcohol and does not use drugs.  Allergies: No Known Allergies  Medications: Prior to Admission medications   Medication Sig Start Date End Date Taking? Authorizing Provider  amiodarone (PACERONE) 200 MG tablet Take 1 tablet (200 mg total) by  mouth daily. Follow up appt required for further refills. Please call [phone] to schedule an appt Patient taking differently: Take 200 mg by mouth daily. 01/28/21  Yes Bensimhon, Shaune Pascal, MD  atorvastatin (LIPITOR) 80 MG tablet TAKE 1 TABLET(80 MG) BY MOUTH DAILY Patient taking differently: Take 80 mg by mouth at bedtime. 12/22/20  Yes [name], MD  Cholecalciferol (VITAMIN D3 PO) Take 1 tablet by mouth daily.   Yes [provider]  citalopram (CELEXA) 20 MG tablet Take 20 mg by mouth daily.   Yes [provider]  diltiazem (CARDIZEM CD) 180 MG 24 hr capsule TAKE 1 CAPSULE(180 MG) BY MOUTH DAILY Patient taking differently: Take 180 mg by mouth daily. 08/24/21  Yes Bensimhon, Shaune Pascal, MD  ELIQUIS 2.5 MG TABS tablet TAKE 1 TABLET(2.5 MG) BY MOUTH TWICE DAILY Patient taking differently: Take 2.5 mg by mouth 2 (two) times daily. 01/15/21  Yes Bensimhon, Shaune Pascal, MD  FARXIGA 5 MG TABS tablet TAKE 1 TABLET(5 MG) BY MOUTH DAILY Patient taking differently: Take 5 mg by mouth daily. 04/16/21  Yes [name], MD  ferric citrate (AURYXIA) 1 GM 210 MG(Fe) tablet Take 210 mg by mouth daily before supper.   Yes [provider]  glipiZIDE (GLUCOTROL) 5 MG tablet TAKE 1/2 TABLET(2.5 MG) BY MOUTH TWICE DAILY BEFORE BREAKFAST AND DINNER Patient taking differently: Take 2.5 mg by mouth 2 (two) times daily before a meal. 08/21/21  Yes [name], MD  pantoprazole (PROTONIX) 40 MG tablet  TAKE 1 TABLET(40 MG) BY MOUTH DAILY Patient taking differently: Take 40 mg by mouth daily. 04/27/21  Yes Lemmon, Lavone Nian, PA  polyethylene glycol (MIRALAX / GLYCOLAX) 17 g packet Take 17 g by mouth daily as needed for mild constipation or moderate constipation.    Yes [provider]  sitaGLIPtin (JANUVIA) 25 MG tablet TAKE 1 TABLET(25 MG) BY MOUTH DAILY Patient taking differently: Take 25 mg by mouth daily. 08/31/21  Yes [name], MD  tamsulosin (FLOMAX) 0.4 MG CAPS capsule TAKE 2 CAPSULES(0.8 MG) BY MOUTH DAILY Patient taking differently: Take 0.8 mg by mouth daily. 01/08/20  Yes [name], MD  torsemide (DEMADEX) 20 MG tablet Take 1 tablet (20 mg total) by mouth 2 (two) times daily. Temporary increase to 1 tablet twice daily until advised to decrease by his nephrologist. Patient taking differently: Take 20 mg by mouth See admin instructions. 37m once daily, may take an additional 242monce daily as needed for fluid. 08/28/21  Yes GrWendie AgresteMD  vitamin B-12 (CYANOCOBALAMIN) 500 MCG tablet Take 500 mcg by mouth daily.   Yes [provider]  blood glucose meter kit and supplies Use up to two times daily as directed  ICD10 E10.9 E11.9 02/20/19   GrWendie AgresteMD  GlucoCom Lancets MISC Use for home glucose monitoring 09/02/15   JeHarrison MonsPA  glucose blood (ONETOUCH VERIO) test strip Test up to 2 times per day.  Uncontrolled diabetes with hyperglycemia and stage 4 CKD. 04/13/19   GrWendie AgresteMD  oxyCODONE-acetaminophen (PERCOCET) 5-325 MG tablet Take 1 tablet by mouth every 6 (six) hours as needed for severe pain. Patient not taking: Reported on 09/15/2021 09/04/21 09/04/22  BaKaroline CaldwellPA-C     Labs:  Results for orders placed or performed during the hospital encounter of 09/14/21 (from the past 48 hour(s))  CBC with Differential     Status: Abnormal   Collection Time: 09/14/21 12:10 PM  Result Value Ref Range   WBC 12.8 (H) 4.0  - 10.5 K/uL   RBC 3.57 (L) 4.22 - 5.81 MIL/uL   Hemoglobin 9.7 (L) 13.0 - 17.0 g/dL   HCT 30.1 (L) 39.0 - 52.0 %   MCV 84.3 80.0 - 100.0 fL   MCH 27.2 26.0 - 34.0 pg   MCHC 32.2 30.0 - 36.0 g/dL   RDW 16.5 (H) 11.5 - 15.5 %   Platelets 367 150 - 400 K/uL   nRBC 0.0 0.0 - 0.2 %   Neutrophils Relative % 91 %   Neutro Abs 11.7 (H) 1.7 - 7.7 K/uL   Lymphocytes Relative 3 %   Lymphs Abs 0.3 (L) 0.7 - 4.0 K/uL   Monocytes Relative 5 %   Monocytes Absolute 0.6 0.1 - 1.0 K/uL   Eosinophils Relative 0 %   Eosinophils Absolute 0.0 0.0 - 0.5 K/uL   Basophils Relative 0 %   Basophils Absolute 0.1 0.0 - 0.1 K/uL   Immature Granulocytes 1 %   Abs Immature Granulocytes 0.06 0.00 - 0.07 K/uL    Comment: Performed at [facility], [address] [place], [place] [address]  Comprehensive metabolic panel     Status: Abnormal   Collection Time: 09/14/21 12:10 PM  Result Value Ref Range   Sodium 134 (L) 135 - 145 mmol/L   Potassium 4.2 3.5 - 5.1 mmol/L   Chloride 102 98 - 111 mmol/L   CO2 17 (L) 22 - 32 mmol/L   Glucose, Bld 246 (H) 70 - 99 mg/dL    Comment: Glucose reference range applies only to samples taken after fasting for at least 8 hours.   BUN 68 (H) 8 - 23 mg/dL   Creatinine, Ser 3.23 (H) 0.61 - 1.24 mg/dL   Calcium 8.6 (L) 8.9 - 10.3 mg/dL   Total Protein 7.8 6.5 - 8.1 g/dL   Albumin 3.3 (L) 3.5 - 5.0 g/dL   AST 29 15 - 41 U/L   ALT 29 0 - 44 U/L   Alkaline Phosphatase 210 (H) 38 - 126 U/L   Total Bilirubin 0.6 0.3 - 1.2 mg/dL   GFR, Estimated 19 (L) >60 mL/min    Comment: (NOTE) Calculated using the CKD-EPI Creatinine Equation (2021)    Anion gap 15 5 - 15    Comment: Performed at [facility], [address] [place], [place] [address]  Brain natriuretic peptide     Status: Abnormal   Collection Time: 09/14/21 12:10 PM  Result Value Ref Range   B Natriuretic Peptide 1,427.2 (H) 0.0 - 100.0 pg/mL    Comment: Performed at [facility], [address] [place], [place] [address]  Troponin I (High Sensitivity)     Status: Abnormal   Collection Time: 09/14/21 12:32 PM  Result Value Ref Range   Troponin I (High Sensitivity) 44 (H) <18 ng/L    Comment: (NOTE) Elevated high sensitivity troponin I (hsTnI) values and significant  changes across serial measurements may suggest ACS but many other  chronic and acute conditions are known to elevate hsTnI results.  Refer to the "Links" section for chest pain algorithms and additional  guidance. Performed at [facility], [address] [place], [place] [address]   Resp Panel by RT-PCR (Flu A&B, Covid) Nasopharyngeal Swab     Status: Abnormal   Collection Time: 09/14/21  1:51 PM   Specimen: Nasopharyngeal Swab; Nasopharyngeal(NP) swabs in vial transport medium  Result Value Ref Range   SARS Coronavirus 2 by RT PCR POSITIVE (A) NEGATIVE    Comment: (NOTE) SARS-CoV-2 target nucleic acids are DETECTED.  The SARS-CoV-2 RNA is generally detectable in upper respiratory specimens during the acute phase of infection. Positive results are indicative of the presence of the identified virus, but do not rule out bacterial infection or co-infection with other pathogens not detected by the test. Clinical correlation with patient history and other diagnostic information is necessary to determine patient infection status. The expected result is Negative.  Fact Sheet for Patients: EntrepreneurPulse.com.au  Fact Sheet for Healthcare Providers: IncredibleEmployment.be  This test is not yet approved or cleared by the [place] FDA and  has been authorized for detection and/or diagnosis of SARS-CoV-2 by FDA under an Emergency Use Authorization (EUA).  This EUA will remain in effect (meaning this test can be used) for the duration of  the COVID-19 declaration under Section 564(b)(1) of the A ct, 21 U.S.C. section 360bbb-3(b)(1), unless the  authorization is terminated or revoked sooner.     Influenza A by PCR NEGATIVE NEGATIVE   Influenza B by PCR NEGATIVE NEGATIVE    Comment: (NOTE) The Xpert Xpress SARS-CoV-2/FLU/RSV plus assay is intended as an aid in the diagnosis of influenza from Nasopharyngeal swab specimens and should not be used as a sole basis for treatment. Nasal washings and aspirates are unacceptable for Xpert Xpress SARS-CoV-2/FLU/RSV testing.  Fact Sheet for Patients: EntrepreneurPulse.com.au  Fact Sheet for Healthcare Providers: IncredibleEmployment.be  This test is not yet approved or cleared by the [place] FDA and has been authorized for detection and/or diagnosis of SARS-CoV-2 by FDA under an Emergency Use Authorization (EUA). This EUA will remain in effect (meaning this test can be used) for the duration of the COVID-19 declaration under Section 564(b)(1) of the Act, 21 U.S.C. section 360bbb-3(b)(1), unless the authorization is terminated or revoked.  Performed at [facility], [address] [place], [place] [address]   Troponin I (High Sensitivity)     Status: Abnormal   Collection Time: 09/14/21  2:47 PM  Result Value Ref Range   Troponin I (High Sensitivity) 41 (H) <18 ng/L    Comment: (NOTE) Elevated high sensitivity troponin I (hsTnI) values and significant  changes across serial measurements may suggest ACS but many other  chronic and acute conditions are known to elevate hsTnI results.  Refer to the "Links" section for chest pain algorithms and additional  guidance. Performed at [facility], [address] [place], [place] [address]   Urinalysis, Routine w reflex microscopic Urine, Clean Catch     Status: Abnormal   Collection Time: 09/14/21  4:00 PM  Result Value Ref Range   Color, Urine YELLOW YELLOW   APPearance CLEAR CLEAR   Specific Gravity, Urine 1.015 1.005 - 1.030   pH 5.0 5.0 - 8.0   Glucose, UA >=500  (A) NEGATIVE mg/dL   Hgb urine dipstick TRACE (A) NEGATIVE   Bilirubin Urine NEGATIVE NEGATIVE   Ketones, ur NEGATIVE NEGATIVE mg/dL   Protein, ur 100 (A) NEGATIVE mg/dL   Nitrite NEGATIVE NEGATIVE   Leukocytes,Ua NEGATIVE NEGATIVE    Comment: Performed at [facility], [address] [place], [place] [address]  Urinalysis, Microscopic (reflex)     Status: Abnormal   Collection Time: 09/14/21  4:00 PM  Result Value Ref Range   RBC / HPF 0-5 0 - 5 RBC/hpf   WBC, UA 0-5 0 -  5 WBC/hpf   Bacteria, UA RARE (A) NONE SEEN   Squamous Epithelial / LPF 0-5 0 - 5    Comment: Performed at [facility], [address] [place], [place] [address]  Glucose, capillary     Status: Abnormal   Collection Time: 09/14/21  9:29 PM  Result Value Ref Range   Glucose-Capillary 31 (LL) 70 - 99 mg/dL    Comment: Glucose reference range applies only to samples taken after fasting for at least 8 hours.   Comment 1 Notify RN   Glucose, capillary     Status: Abnormal   Collection Time: 09/14/21 10:32 PM  Result Value Ref Range   Glucose-Capillary 210 (H) 70 - 99 mg/dL    Comment: Glucose reference range applies only to samples taken after fasting for at least 8 hours.  Glucose, capillary     Status: Abnormal   Collection Time: 09/15/21 12:32 AM  Result Value Ref Range   Glucose-Capillary 275 (H) 70 - 99 mg/dL    Comment: Glucose reference range applies only to samples taken after fasting for at least 8 hours.  CBC with Differential/Platelet     Status: Abnormal   Collection Time: 09/15/21 12:58 AM  Result Value Ref Range   WBC 13.8 (H) 4.0 - 10.5 K/uL   RBC 3.33 (L) 4.22 - 5.81 MIL/uL   Hemoglobin 9.0 (L) 13.0 - 17.0 g/dL   HCT 27.9 (L) 39.0 - 52.0 %   MCV 83.8 80.0 - 100.0 fL   MCH 27.0 26.0 - 34.0 pg   MCHC 32.3 30.0 - 36.0 g/dL   RDW 16.3 (H) 11.5 - 15.5 %   Platelets 355 150 - 400 K/uL   nRBC 0.0 0.0 - 0.2 %   Neutrophils Relative % 97 %   Neutro Abs 13.3 (H) 1.7 - 7.7 K/uL    Lymphocytes Relative 1 %   Lymphs Abs 0.2 (L) 0.7 - 4.0 K/uL   Monocytes Relative 1 %   Monocytes Absolute 0.2 0.1 - 1.0 K/uL   Eosinophils Relative 0 %   Eosinophils Absolute 0.0 0.0 - 0.5 K/uL   Basophils Relative 0 %   Basophils Absolute 0.0 0.0 - 0.1 K/uL   Immature Granulocytes 1 %   Abs Immature Granulocytes 0.07 0.00 - 0.07 K/uL    Comment: Performed at [facility], [address], [place], [place] [address]  C-reactive protein     Status: Abnormal   Collection Time: 09/15/21 12:58 AM  Result Value Ref Range   CRP 12.3 (H) <1.0 mg/dL    Comment: Performed at [facility] [address], [place], [place] [address]  Ferritin     Status: Abnormal   Collection Time: 09/15/21 12:58 AM  Result Value Ref Range   Ferritin 834 (H) 24 - 336 ng/mL    Comment: Performed at [facility], [place] [address] [place], [place] [address]  Comprehensive metabolic panel     Status: Abnormal   Collection Time: 09/15/21 12:58 AM  Result Value Ref Range   Sodium 131 (L) 135 - 145 mmol/L   Potassium 3.9 3.5 - 5.1 mmol/L   Chloride 101 98 - 111 mmol/L   CO2 16 (L) 22 - 32 mmol/L   Glucose, Bld 274 (H) 70 - 99 mg/dL    Comment: Glucose reference range applies only to samples taken after fasting for at least 8 hours.   BUN 67 (H) 8 - 23 mg/dL   Creatinine,  Ser 3.41 (H) 0.61 - 1.24 mg/dL   Calcium 8.4 (L) 8.9 - 10.3 mg/dL   Total Protein 7.5 6.5 - 8.1 g/dL   Albumin 3.0 (L) 3.5 - 5.0 g/dL   AST 18 15 - 41 U/L   ALT 25 0 - 44 U/L   Alkaline Phosphatase 195 (H) 38 - 126 U/L   Total Bilirubin 0.6 0.3 - 1.2 mg/dL   GFR, Estimated 18 (L) >60 mL/min    Comment: (NOTE) Calculated using the CKD-EPI Creatinine Equation (2021)    Anion gap 14 5 - 15    Comment: Performed at [facility], [place] [address], [place], [place] [address]  Procalcitonin     Status: None   Collection Time: 09/15/21 12:58 AM  Result Value Ref Range   Procalcitonin 0.83 ng/mL    Comment:         Interpretation: PCT > 0.5 ng/mL and <= 2 ng/mL: Systemic infection (sepsis) is possible, but other conditions are known to elevate PCT as well. (NOTE)       Sepsis PCT Algorithm           Lower Respiratory Tract                                      Infection PCT Algorithm    ----------------------------     ----------------------------         PCT < 0.25 ng/mL                PCT < 0.10 ng/mL          Strongly encourage             Strongly discourage   discontinuation of antibiotics    initiation of antibiotics    ----------------------------     -----------------------------       PCT 0.25 - 0.50 ng/mL            PCT 0.10 - 0.25 ng/mL               OR       >80% decrease in PCT            Discourage initiation of                                            antibiotics      Encourage discontinuation           of antibiotics    ----------------------------     -----------------------------         PCT >= 0.50 ng/mL              PCT 0.26 - 0.50 ng/mL                AND       <80% decrease in PCT             Encourage initiation of                                             antibiotics       Encourage continuation           of antibiotics    ----------------------------     -----------------------------  PCT >= 0.50 ng/mL                  PCT > 0.50 ng/mL               AND         increase in PCT                  Strongly encourage                                      initiation of antibiotics    Strongly encourage escalation           of antibiotics                                     -----------------------------                                           PCT <= 0.25 ng/mL                                                 OR                                        > 80% decrease in PCT                                      Discontinue / Do not initiate                                             antibiotics  Performed at [facility], [address]  [place], [place] [address]   Glucose, capillary     Status: Abnormal   Collection Time: 09/15/21  6:52 AM  Result Value Ref Range   Glucose-Capillary 195 (H) 70 - 99 mg/dL    Comment: Glucose reference range applies only to samples taken after fasting for at least 8 hours.     ROS:  As per HPI otherwise negative  Physical Exam: Vitals:   09/15/21 0723 09/15/21 0748  BP: (!) 132/53   Pulse: 79   Resp: (!) 28 (!) 21  Temp: 98.1 F (36.7 C)   SpO2: 99%      General exam: Pleasant male sitting up in bed, AAOx3 HEENT: Positive JVD CVS: S1-S2, regular rhythm Lungs: Diffuse mild basilar rales Abdomen: Soft, nontender, bowel sounds present Extremities: no edema  Skin: No rashes Psychiatry: Judgement and insight appear normal. Mood & affect appropriate.   Assessment/Plan: 1. Progressing CKD-V s/p AVF.  -Cr stable at 3.41. -Avoids NSAIDs, nephrotoxins -Monitor UOP, BMP  2. Hypervolemia 2/2 Acute on chronic diastolic CHF ([place]); Hyponatremia  -Continue Lasix 80 Mg every 12hrs.  -Monitor I's/O, weights, BMPs in a.m. -Hyponatremia should improve with  diuresis   3. HTN -Continue home Cardizem CD.   4. Normocytic Anemia likely from chronic disease - Monitor Hgb, Consider transfusing if <7  I have seen and examined this patient and agree with the plan of care.  There appears to be no indications for urgent dialysis.  He has a mild metabolic acidosis which we can treat with sodium bicarbonate.  650 mg twice daily.  Chest x-ray is consistent with pulmonary edema.  His most recent 2D echo is consistent with diastolic dysfunction with normal valvular.  Structures.  There appears to be diuresis with intravenous Lasix.  We can   follow diuresis and weights daily.  There is no reason to discontinue the Farxiga at this point.  We will continue to follow him with his primary service  Sherril Croon 09/15/2021, 7:57 PM    Abelardo Diesel Penninger PA-S2 09/15/2021, 10:15 AM

## 2021-09-15 NOTE — Progress Notes (Signed)
Heart Failure Navigator Progress Note ? ?Assessed for Heart & Vascular TOC clinic readiness.  ?Patient does not meet criteria due to prior to hospitalization patient established with AHF clinic, Dr. Haroldine Laws last seen 05/26/2021.  ? ?Navigator available for educational resources.  ? ?Pricilla Holm, MSN, RN ?Heart Failure Nurse Navigator ?[phone] ? ? ?

## 2021-09-15 NOTE — Progress Notes (Addendum)
PROGRESS NOTE    Adam Reid  DDU:[id number] DOB: April 13, 1946 DOA: 09/14/2021 PCP: [name], MD  Narrative [phone] with history of chronic diastolic CHF, CKD 4 with baseline creatinine of 3.5, recent AV fistula placement on 2/24, type 2 diabetes mellitus, hypertension, dyslipidemia, hypothyroidism presented to the ED with worsening dyspnea on exertion and swelling, he was hypoxic in the emergency room, chest x-ray noted small to moderate bilateral pleural effusions, BNP was 1500. -In the ED he was found to be COVID-positive   Subjective: -Feels much better today, breathing is improving -Patient's wife helped translate  Assessment and Plan:  Acute on chronic diastolic CHF (congestive heart failure) ([place]) Progressive CKD 4 -Admitted with fluid overload, responding to diuretics, continue Lasix 80 Mg every 12 he is 1 L negative, clinically improving -Baseline creatinine around 3.5 -Nephrology consult requested -2D echo 1/23 noted EF of 55%, mild LVH, mild MR -Monitor I's/O, weights, BMP in a.m.  Recent COVID-19 virus infection -Discussed with spouse, patient tested positive for COVID in February, he had cough and URI symptoms then, he is 4 weeks out from active infection -Current symptoms related to CHF, no indication for molnupiravir or steroids, will discontinue both  Acute respiratory failure with hypoxia ([place]) -Secondary to above, improving, wean off O2  Paroxysmal A-fib (HCC) -Continue home regimen of Cardizem, amiodarone and Eliquis  HTN (hypertension) -continue Cardizem CD.  Diabetes mellitus with renal manifestations, controlled ([place]) -Hold glipizide, continue Januvia -SSI  Chronic kidney disease (CKD), stage V ([place]) -AV fistula placed 2/24, followed by Dr. [name] -Now with fluid overload, diuretics as noted above  BPH (benign prostatic hyperplasia) -continue Flomax.  DVT prophylaxis: Apixaban Code Status: Full code Family Communication: No family at  bedside, called patient's spouse from his room Disposition Plan: Home in 1 to 2 days  Consultants:  Nephrology  Procedures:   Antimicrobials:    Objective: Vitals:   09/15/21 0038 09/15/21 0452 09/15/21 0723 09/15/21 0748  BP: (!) 156/76 138/72 (!) 132/53   Pulse: 85 77 79   Resp: 19 (!) 21 (!) 28 (!) 21  Temp: 98.1 F (36.7 C) 98.1 F (36.7 C) 98.1 F (36.7 C)   TempSrc: Oral Oral Oral   SpO2: 97% 98% 99%   Weight:  53.8 kg    Height:        Intake/Output Summary (Last 24 hours) at 09/15/2021 6283 Last data filed at 09/15/2021 0400 Gross per 24 hour  Intake 480 ml  Output 1100 ml  Net -620 ml   Filed Weights   09/14/21 1205 09/14/21 2012 09/15/21 0452  Weight: 55.8 kg 53.8 kg 53.8 kg    Examination:  General exam: Pleasant male sitting up in bed, AAOx3 HEENT: Positive JVD CVS: S1-S2, regular rhythm Lungs: Few basilar rales Abdomen: Soft, nontender, bowel sounds present Extremities: Trace edema  Skin: No rashes Psychiatry: Judgement and insight appear normal. Mood & affect appropriate.     Data Reviewed:   CBC: Recent Labs  Lab 09/11/21 1235 09/14/21 1210 09/15/21 0058  WBC  --  12.8* 13.8*  NEUTROABS  --  11.7* 13.3*  HGB 9.5* 9.7* 9.0*  HCT  --  30.1* 27.9*  MCV  --  84.3 83.8  PLT  --  367 151   Basic Metabolic Panel: Recent Labs  Lab 09/14/21 1210 09/15/21 0058  NA 134* 131*  K 4.2 3.9  CL 102 101  CO2 17* 16*  GLUCOSE 246* 274*  BUN 68* 67*  CREATININE 3.23* 3.41*  CALCIUM  8.6* 8.4*   GFR: Estimated Creatinine Clearance: 14.5 mL/min (A) (by C-G formula based on SCr of 3.41 mg/dL (H)). Liver Function Tests: Recent Labs  Lab 09/14/21 1210 09/15/21 0058  AST 29 18  ALT 29 25  ALKPHOS 210* 195*  BILITOT 0.6 0.6  PROT 7.8 7.5  ALBUMIN 3.3* 3.0*   No results for input(s): LIPASE, AMYLASE in the last 168 hours. No results for input(s): AMMONIA in the last 168 hours. Coagulation Profile: No results for input(s): INR, PROTIME  in the last 168 hours. Cardiac Enzymes: No results for input(s): CKTOTAL, CKMB, CKMBINDEX, TROPONINI in the last 168 hours. BNP (last 3 results) No results for input(s): PROBNP in the last 8760 hours. HbA1C: No results for input(s): HGBA1C in the last 72 hours. CBG: Recent Labs  Lab 09/14/21 2129 09/14/21 2232 09/15/21 0032 09/15/21 0652  GLUCAP 31* 210* 275* 195*   Lipid Profile: No results for input(s): CHOL, HDL, LDLCALC, TRIG, CHOLHDL, LDLDIRECT in the last 72 hours. Thyroid Function Tests: No results for input(s): TSH, T4TOTAL, FREET4, T3FREE, THYROIDAB in the last 72 hours. Anemia Panel: Recent Labs    09/15/21 0058  FERRITIN 834*   Urine analysis:    Component Value Date/Time   COLORURINE YELLOW 09/14/2021 1600   APPEARANCEUR CLEAR 09/14/2021 1600   LABSPEC 1.015 09/14/2021 1600   PHURINE 5.0 09/14/2021 1600   GLUCOSEU >=500 (A) 09/14/2021 1600   HGBUR TRACE (A) 09/14/2021 1600   BILIRUBINUR NEGATIVE 09/14/2021 1600   BILIRUBINUR negative 03/02/2021 1458   BILIRUBINUR neg 03/23/2015 1404   KETONESUR NEGATIVE 09/14/2021 1600   PROTEINUR 100 (A) 09/14/2021 1600   UROBILINOGEN 0.2 03/02/2021 1458   NITRITE NEGATIVE 09/14/2021 1600   LEUKOCYTESUR NEGATIVE 09/14/2021 1600   Sepsis Labs: '@LABRCNTIP'$ (procalcitonin:4,lacticidven:4)  ) Recent Results (from the past 240 hour(s))  Resp Panel by RT-PCR (Flu A&B, Covid) Nasopharyngeal Swab     Status: Abnormal   Collection Time: 09/14/21  1:51 PM   Specimen: Nasopharyngeal Swab; Nasopharyngeal(NP) swabs in vial transport medium  Result Value Ref Range Status   SARS Coronavirus 2 by RT PCR POSITIVE (A) NEGATIVE Final    Comment: (NOTE) SARS-CoV-2 target nucleic acids are DETECTED.  The SARS-CoV-2 RNA is generally detectable in upper respiratory specimens during the acute phase of infection. Positive results are indicative of the presence of the identified virus, but do not rule out bacterial infection or  co-infection with other pathogens not detected by the test. Clinical correlation with patient history and other diagnostic information is necessary to determine patient infection status. The expected result is Negative.  Fact Sheet for Patients: EntrepreneurPulse.com.au  Fact Sheet for Healthcare Providers: IncredibleEmployment.be  This test is not yet approved or cleared by the [place] FDA and  has been authorized for detection and/or diagnosis of SARS-CoV-2 by FDA under an Emergency Use Authorization (EUA).  This EUA will remain in effect (meaning this test can be used) for the duration of  the COVID-19 declaration under Section 564(b)(1) of the A ct, 21 U.S.C. section 360bbb-3(b)(1), unless the authorization is terminated or revoked sooner.     Influenza A by PCR NEGATIVE NEGATIVE Final   Influenza B by PCR NEGATIVE NEGATIVE Final    Comment: (NOTE) The Xpert Xpress SARS-CoV-2/FLU/RSV plus assay is intended as an aid in the diagnosis of influenza from Nasopharyngeal swab specimens and should not be used as a sole basis for treatment. Nasal washings and aspirates are unacceptable for Xpert Xpress SARS-CoV-2/FLU/RSV testing.  Fact Sheet for Patients:  EntrepreneurPulse.com.au  Fact Sheet for Healthcare Providers: IncredibleEmployment.be  This test is not yet approved or cleared by the [place] FDA and has been authorized for detection and/or diagnosis of SARS-CoV-2 by FDA under an Emergency Use Authorization (EUA). This EUA will remain in effect (meaning this test can be used) for the duration of the COVID-19 declaration under Section 564(b)(1) of the Act, 21 U.S.C. section 360bbb-3(b)(1), unless the authorization is terminated or revoked.  Performed at [facility], [address] [place], [place] [address]      Radiology Studies: DG Chest [place] 1 View  Result Date:  09/14/2021 CLINICAL DATA:  Dyspnea, hypoxia EXAM: PORTABLE CHEST 1 VIEW COMPARISON:  08/28/2021 chest radiograph. FINDINGS: Stable cardiomediastinal silhouette with top-normal heart size. No pneumothorax. Small to moderate bilateral pleural effusions, chronic and similar. Hazy bibasilar lung opacities, similar. Streaky curvilinear opacities at the periphery of the mid lungs bilaterally, similar accounting for different technique. IMPRESSION: 1. Chronic small to moderate bilateral pleural effusions. 2. Hazy bibasilar lung opacities and streaky curvilinear opacities at the periphery of the mid lungs bilaterally, favor scarring or atelectasis. Electronically Signed   By: Ilona Sorrel M.D.   On: 09/14/2021 13:14     Scheduled Meds:  amiodarone  200 mg Oral Daily   apixaban  2.5 mg Oral BID   vitamin C  500 mg Oral Daily   atorvastatin  80 mg Oral QHS   citalopram  20 mg Oral Daily   dapagliflozin propanediol  5 mg Oral Daily   diltiazem  180 mg Oral Daily   furosemide  80 mg Intravenous BID   glipiZIDE  2.5 mg Oral BID AC   insulin aspart  0-15 Units Subcutaneous TID AC & HS   linagliptin  5 mg Oral Daily   pantoprazole  40 mg Oral Daily   tamsulosin  0.4 mg Oral Daily   vitamin B-12  500 mcg Oral Daily   zinc sulfate  220 mg Oral Daily   Continuous Infusions:   LOS: 0 days    Time spent: 31mn    PDomenic Polite MD [facility]   09/15/2021, 9:39 AM

## 2021-09-15 NOTE — Progress Notes (Signed)
Hypoglycemic Event ? ?CBG: 31 ? ?Treatment: Upon admittion, pt was fount to be hypoglycemic with blood sugar level of 31. Pt states he felt okay. Hypoglycemic protocol was ordered. '25mg'$  of dextrose was given to pt. Blood sugar went up to 217 an hour after dextrose was given. Pt states that he did not eat since going to the ED in the morning on 3/6.  Will continue to monitor. ? ?Symptoms: None ? ?Follow-up CBG: Time:2130 CBG Result:210 ? ?Possible Reasons for Event: poor meal intake ? ?Comments/MD notified: admitting Dr.Mansy was present during event. ? ? ? ?Adam Reid ? ? ?

## 2021-09-15 NOTE — Progress Notes (Deleted)
Hypoglycemic Event ? ?CBG: 31 ? ?Treatment: Upon admittion, pt was fount to be hypoglycemic with blood sugar level of 31. Pt states he felt okay. Hypoglycemic protocol was ordered. '25mg'$  of dextrose was given to pt. Blood sugar went up to 217 an hour after dextrose was given. Pt states that he did not eat since going to the ED in the morning on 3/6.  Will continue to monitor. ? ?Symptoms: None ? ?Follow-up CBG: Time:2130 CBG Result:210 ? ?Possible Reasons for Event: poor meal intake ? ?Comments/MD notified: admitting Dr.Mansy was present during event. ? ? ? ?Adam Reid ? ? ?

## 2021-09-16 DIAGNOSIS — I10 Essential (primary) hypertension: Secondary | ICD-10-CM

## 2021-09-16 DIAGNOSIS — D509 Iron deficiency anemia, unspecified: Secondary | ICD-10-CM | POA: Diagnosis present

## 2021-09-16 DIAGNOSIS — N179 Acute kidney failure, unspecified: Secondary | ICD-10-CM

## 2021-09-16 DIAGNOSIS — E1169 Type 2 diabetes mellitus with other specified complication: Secondary | ICD-10-CM

## 2021-09-16 DIAGNOSIS — N189 Chronic kidney disease, unspecified: Secondary | ICD-10-CM

## 2021-09-16 LAB — GLUCOSE, CAPILLARY
Glucose-Capillary: 185 mg/dL — ABNORMAL HIGH (ref 70–99)
Glucose-Capillary: 277 mg/dL — ABNORMAL HIGH (ref 70–99)
Glucose-Capillary: 120 mg/dL — ABNORMAL HIGH (ref 70–99)
Glucose-Capillary: 302 mg/dL — ABNORMAL HIGH (ref 70–99)

## 2021-09-16 LAB — COMPREHENSIVE METABOLIC PANEL
ALT: 23 U/L (ref 0–44)
AST: 18 U/L (ref 15–41)
Albumin: 3 g/dL — ABNORMAL LOW (ref 3.5–5.0)
Alkaline Phosphatase: 172 U/L — ABNORMAL HIGH (ref 38–126)
Anion gap: 14 (ref 5–15)
BUN: 87 mg/dL — ABNORMAL HIGH (ref 8–23)
CO2: 18 mmol/L — ABNORMAL LOW (ref 22–32)
Calcium: 8.4 mg/dL — ABNORMAL LOW (ref 8.9–10.3)
Chloride: 98 mmol/L (ref 98–111)
Creatinine, Ser: 3.7 mg/dL — ABNORMAL HIGH (ref 0.61–1.24)
GFR, Estimated: 16 mL/min — ABNORMAL LOW (ref >60)
Glucose, Bld: 213 mg/dL — ABNORMAL HIGH (ref 70–99)
Potassium: 3.6 mmol/L (ref 3.5–5.1)
Sodium: 130 mmol/L — ABNORMAL LOW (ref 135–145)
Total Bilirubin: 0.3 mg/dL (ref 0.3–1.2)
Total Protein: 7.4 g/dL (ref 6.5–8.1)

## 2021-09-16 LAB — CBC
HCT: 27.6 % — ABNORMAL LOW (ref 39.0–52.0)
Hemoglobin: 8.8 g/dL — ABNORMAL LOW (ref 13.0–17.0)
MCH: 26.7 pg (ref 26.0–34.0)
MCHC: 31.9 g/dL (ref 30.0–36.0)
MCV: 83.6 fL (ref 80.0–100.0)
Platelets: 375 10*3/uL (ref 150–400)
RBC: 3.3 MIL/uL — ABNORMAL LOW (ref 4.22–5.81)
RDW: 16.3 % — ABNORMAL HIGH (ref 11.5–15.5)
WBC: 12.3 10*3/uL — ABNORMAL HIGH (ref 4.0–10.5)
nRBC: 0 % (ref 0.0–0.2)

## 2021-09-16 LAB — IRON AND TIBC
Iron: 26 ug/dL — ABNORMAL LOW (ref 45–182)
Saturation Ratios: 12 % — ABNORMAL LOW (ref 17.9–39.5)
TIBC: 211 ug/dL — ABNORMAL LOW (ref 250–450)
UIBC: 185 ug/dL

## 2021-09-16 MED ORDER — TORSEMIDE 20 MG PO TABS
20.0000 mg | ORAL_TABLET | Freq: Every day | ORAL | Status: DC
  Filled 2021-09-16: qty 1

## 2021-09-16 MED ORDER — FERROUS SULFATE 325 (65 FE) MG PO TABS
325.0000 mg | ORAL_TABLET | Freq: Every day | ORAL | Status: DC
  Administered 2021-09-17: 07:00:00 325 mg via ORAL
  Filled 2021-09-16: qty 1

## 2021-09-16 MED ORDER — SODIUM CHLORIDE 0.9 % IV SOLN: 250.0000 mg | Freq: Every day | INTRAVENOUS | Status: DC

## 2021-09-16 NOTE — Hospital Course (Addendum)
Adam Reid was admitted to the hospital with the working diagnosis of acute on chronic diastolic heart failure in the setting of CKD stage IV to V.  ? ?76 yo male with the past medical history of diastolic heart failure, D2KG, CKD stage IV to V, hypertension, dyslipidemia, and hypothyroidism, who presented with dyspnea. Reported worsening dyspnea on exertion for the last few days into his hospitalization, associated with cough and orthopnea. Recently reduced his dose of torsemide. On his initial physical examination his blood pressure was 146/76, RR 32, HR 82, 02 saturation 100% on supplemental 02 per [place] 3 L/min, he had decreased breath sounds bilaterally with rales and no wheezing, heart with S1 and S2 present and regular with no gallops, abdomen soft and non distended, no lower extremity edema.  ? ?Na 134, K 4,2 CL 102, bicarb 17, glucose 246, bun 68, cr 3,23  ?BNP 1,427  ?High sensitive troponin 44 and 41  ?Wbc 12,8. Hgb 9,7, hct 30 and plt 367  ? ?SARS covid 19 positive (positive COVID 19 on 08/2021)  ?Urine analysis with specific gravity 1,015  ? ?Chest radiograph with bilateral pleural effusions, with interstitial infiltrates bilateral lower lobes, fluid in the right fissure and left upper lobe peripheral atelectasis.  ? ?EKG 75 bpm, normal axis, qtc 512, sinus rhythm with PAC, poor R wave progression and LVH, with no significant ST segment or T wave changes.  ? ?Patient was placed on IV diuresis with improvement of his symptoms.  ? ?Patient developed AKI, that improved with supportive medical therapy. ?At the time of his discharge his renal function is improving. ?Plan to follow up as outpatient.  ?

## 2021-09-16 NOTE — Plan of Care (Signed)
?  Problem: Education: ?Goal: Ability to demonstrate management of disease process will improve ?Outcome: Progressing ?  ?Problem: Elimination: ?Goal: Will not experience complications related to urinary retention ?Outcome: Progressing ?  ?

## 2021-09-16 NOTE — Progress Notes (Signed)
?  Mobility Specialist Criteria Algorithm Info. ? ? 09/16/21 1007  ?Mobility  ?Activity Ambulated with assistance in hallway  ?Range of Motion/Exercises Active;All extremities  ?Level of Assistance Independent  ?Assistive Device None  ?Distance Ambulated (ft) 160 ft  ?Activity Response Tolerated well  ? ?HR pre: 80 ?HR during: 89 ?HR post: 84 ? ?Patient received in bed agreeable to participate in mobility. Ambulated in hallway independently with steady gait. Returned to room without complaint or incident. Was left lying supine in bed with all needs met, call bell in reach.  ? ? ?09/16/2021 ?10:53 AM ? ?[place] Tamantha Saline, CMS, BS EXP ?Acute Rehabilitation Services  ?MGNOI:[phone] ?Office: [phone] ? ?

## 2021-09-16 NOTE — Assessment & Plan Note (Addendum)
hgb is 8,8 and hct 27,9 ?Iron panel with serum iron 26, TIBC 211, transferrin saturation 12 and ferritin 834 consistent with anemia of chronic disease with iron deficiency.   ? ?His ferritin has been chronically elevated and his transferrin saturation has been slowly decreasing, now to less than 20.  ?Note patient had covid 19 last month.  ? ?Patient received IV iron during his hospitalization, plan to follow up on iron panel as outpatient.  ?

## 2021-09-16 NOTE — Progress Notes (Addendum)
?Progress Note ? ? ?Patient: Adam Reid ZJQ:[id number] DOB: 1946-04-15 DOA: 09/14/2021     1 ?DOS: the patient was seen and examined on 09/16/2021 ?  ?Brief hospital course: ?Mr. Trotti was admitted to the hospital with the working diagnosis of acute on chronic diastolic heart failure in the setting of CKD stage IV to V.  ? ?76 yo male with the past medical history of diastolic heart failure, W4OX, CKD stage IV to V, hypertension, dyslipidemia, and hypothyroidism, who presented with dyspnea. Reported worsening dyspnea on exertion for the last few days into his hospitalization, associated with cough and orthopnea. Recently reduced his dose of torsemide. On his initial physical examination his blood pressure was 146/76, RR 32, HR 82, 02 saturation 100% on supplemental 02 per [place] 3 L/min, he had decreased breath sounds bilaterally with rales and no wheezing, heart with S1 and S2 present and regular with no gallops, abdomen soft and non distended, no lower extremity edema.  ? ?Na 134, K 4,2 CL 102, bicarb 17, glucose 246, bun 68, cr 3,23  ?BNP 1,427  ?High sensitive troponin 44 and 41  ?Wbc 12,8. Hgb 9,7, hct 30 and plt 367  ? ?SARS covid 19 positive (positive COVID 19 on 08/2021)  ?Urine analysis with specific gravity 1,015  ? ?Chest radiograph with bilateral pleural effusions, with interstitial infiltrates bilateral lower lobes, fluid in the right fissure and left upper lobe peripheral atelectasis.  ? ?EKG 75 bpm, normal axis, qtc 512, sinus rhythm with PAC, poor R wave progression and LVH, with no significant ST segment or T wave changes.  ? ?Patient was placed on IV diuresis with improvement of his symptoms.  ? ?Assessment and Plan: ?* Acute on chronic diastolic CHF (congestive heart failure) ([place]) ?Patient with improved volume status. He has responded well to diuresis.  ? ?Echocardiogram from 07/2021 his LV EF was preserved 55 to 60% with preserved RV systolic function. No significant valvular disease.  ? ?Plan  to transition to oral torsemide 20 mg and continue blood pressure monitoring.  ?Continue with dapagliflozin  ? ?Acute kidney injury superimposed on chronic kidney disease ([place]) ?Chronic kidney disease stage IV to V.  ?Hyponatremia.  ?Non anion gap metabolic acidosis.  ?Patient with improved volume status. ?Renal function with serum cr at 3,7 with K at 3,6 and serum bicarbonate at 18. Na 130.  ? ?Transition to oral torsemide and check renal panel in am. ?Avoid hypotension and nephrotoxic medications.  ?Continue with oral sodium bicarbonate.  ? ?Acute respiratory failure with hypoxia ([place]) ?Patient with improved volume status. ?Oxymetry is 98% on room air. ?Respiratory failure has resolved.  ? ?Paroxysmal A-fib ([place]) ?Patient continue sinus rhythm, with positive extra beats. ?Plan to continue with diltiazem and amiodarone for rate and rhythm control.  ?Anticoagulation with apixaban.  ? ?HTN (hypertension) ?Blood pressure continue to be well controlled.  ?Patient on diltiazem.  ? ?Type 2 diabetes mellitus with hyperlipidemia ([place]) ?Uncontrolled diabetes with hyperglycemia  ?Fasting glucose this am 213. ? ?Plan to continue glucose cover and monitoring with insulin sliding scale. ?Patient is tolerating po well, continue with linagliptin. ? ?Continue with statin therapy.   ? ?COVID-19 virus infection ?Recent infection in 08/2021, no clinical signs of active infection. ?No isolation required.  ? ?BPH (benign prostatic hyperplasia) ?No clinical signs of urinary retention continue with tamsulosin.  ? ?Iron deficiency anemia ?hgb is 8,8 and hct 27,9 ?Iron panel with serum iron 26, TIBC 211, transferrin saturation 12 and ferritin 834 consistent with anemia of  chronic disease with iron deficiency.   ? ?His ferritin has been chronically elevated and his transferrin saturation has been slowly decreasing, now to less than 20.  ?Note patient had covid 19 last month.  ? ?Plan to add low dose oral and follow up with iron stores as  outpatient.  ? ?Hyperlipidemia LDL goal <100-resolved as of 09/16/2021 ?- We will continue statin therapy. ? ? ? ? ?  ? ?Subjective: Patient is feeling better, dyspnea and edema have improved, no chest pain.  ? ?Physical Exam: ?Vitals:  ? 09/15/21 2301 09/16/21 0358 09/16/21 0754 09/16/21 1135  ?BP: 138/71 126/64 122/63 (!) 144/72  ?Pulse: 79 78  83  ?Resp: '16 19 18 15  '$ ?Temp: 98.2 ?F (36.8 ?C) 98 ?F (36.7 ?C) 98 ?F (36.7 ?C) 98.7 ?F (37.1 ?C)  ?TempSrc: Oral Oral Oral Oral  ?SpO2: 100% 98% 98% 98%  ?Weight:  53.2 kg    ?Height:      ? ?Neurology awake and alert ?ENT with mild pallor ?Cardiovascular with S1 and S2 present, positive extra beats, positive systolic murmur at the apex, ?No JVD ?No lower extremity edema. ?Respiratory with rales at bases bilaterally with no wheezing or rhonchi ?Abdomen soft and non tender  ?Data Reviewed: ? ? ? ?Family Communication: no family at the bedside  ? ?Disposition: ?Status is: Inpatient ?Remains inpatient appropriate because: follow inpatient renal function after aggressive diuresis  ? Planned Discharge Destination: Home ? ? ? ?Author: ?Tawni Millers, MD ?09/16/2021 12:04 PM ? ?For on call review [email or web address].  ?

## 2021-09-16 NOTE — Progress Notes (Signed)
[facility] ?ROUNDING NOTE  ? ?Subjective:  ? ?Interval History: This is a 76 year old gentleman with a history of chronic kidney disease stage IV/V.  Diastolic heart failure diabetes hypertension hyperlipidemia.  He presented to the emergency room 09/14/2020 with acute on chronic diastolic heart failure.  He he is followed by [facility] Dr. [name] and his baseline serum creatinine appears to be at 3.5 mg/dL.  His chest x-ray was consistent with pulmonary edema.  He has received intravenous Lasix 80 mg every 12 hours.  He was incidentally found to be COVID-positive. ? ?Blood pressure 126/64 pulse 82 temperature 98 O2 sats 98% room air ? ?Sodium 130 potassium 3.6 chloride 98 CO2 18 BUN 87 creatinine 3.7 glucose 213 calcium 8.4 albumin 3 alkaline phosphatase 172 hemoglobin 8.8. ? ?Urine output 1.6 L 09/15/2020 ? ?Objective:  ?Vital signs in last 24 hours:  ?Temp:  [97.6 ?F (36.4 ?C)-98.2 ?F (36.8 ?C)] 98 ?F (36.7 ?C) (03/08 0358) ?Pulse Rate:  [73-79] 78 (03/08 0358) ?Resp:  [16-28] 19 (03/08 0358) ?BP: (108-138)/(53-71) 126/64 (03/08 0358) ?SpO2:  [93 %-100 %] 98 % (03/08 0358) ?Weight:  [53.2 kg] 53.2 kg (03/08 0358) ? ?Weight change: -2.6 kg ?Filed Weights  ? 09/14/21 2012 09/15/21 9201 09/16/21 0358  ?Weight: 53.8 kg 53.8 kg 53.2 kg  ? ? ?Intake/Output: ?I/O last 3 completed shifts: ?In: 1180 [P.O.:1180] ?Out: 0071 [Urine:1650] ?  ?Intake/Output this shift: ? Total I/O ?In: 240 [P.O.:240] ?Out: -  ? ?CVS- RRR JVP mildly elevated ?RS- CTA few bibasilar crackles ?ABD- BS present soft non-distended ?EXT-mild peripheral edema ? ? ?Basic Metabolic Panel: ?Recent Labs  ?Lab 09/14/21 ?1210 09/15/21 ?0058 09/16/21 ?2197  ?NA 134* 131* 130*  ?K 4.2 3.9 3.6  ?CL 102 101 98  ?CO2 17* 16* 18*  ?GLUCOSE 246* 274* 213*  ?BUN 68* 67* 87*  ?CREATININE 3.23* 3.41* 3.70*  ?CALCIUM 8.6* 8.4* 8.4*  ? ? ?Liver Function Tests: ?Recent Labs  ?Lab 09/14/21 ?1210 09/15/21 ?0058 09/16/21 ?5883  ?AST '29 18 18  '$ ?ALT  '29 25 23  '$ ?ALKPHOS 210* 195* 172*  ?BILITOT 0.6 0.6 0.3  ?PROT 7.8 7.5 7.4  ?ALBUMIN 3.3* 3.0* 3.0*  ? ?No results for input(s): LIPASE, AMYLASE in the last 168 hours. ?No results for input(s): AMMONIA in the last 168 hours. ? ?CBC: ?Recent Labs  ?Lab 09/11/21 ?1235 09/14/21 ?1210 09/15/21 ?0058 09/16/21 ?2549  ?WBC  --  12.8* 13.8* 12.3*  ?NEUTROABS  --  11.7* 13.3*  --   ?HGB 9.5* 9.7* 9.0* 8.8*  ?HCT  --  30.1* 27.9* 27.6*  ?MCV  --  84.3 83.8 83.6  ?PLT  --  367 355 375  ? ? ?Cardiac Enzymes: ?No results for input(s): CKTOTAL, CKMB, CKMBINDEX, TROPONINI in the last 168 hours. ? ?BNP: ?Invalid input(s): POCBNP ? ?CBG: ?Recent Labs  ?Lab 09/15/21 ?8264 09/15/21 ?1132 09/15/21 ?1623 09/15/21 ?2100 09/16/21 ?1583  ?GLUCAP 195* 222* 213* 194* 185*  ? ? ?Microbiology: ?Results for orders placed or performed during the hospital encounter of 09/14/21  ?Resp Panel by RT-PCR (Flu A&B, Covid) Nasopharyngeal Swab     Status: Abnormal  ? Collection Time: 09/14/21  1:51 PM  ? Specimen: Nasopharyngeal Swab; Nasopharyngeal(NP) swabs in vial transport medium  ?Result Value Ref Range Status  ? SARS Coronavirus 2 by RT PCR POSITIVE (A) NEGATIVE Final  ?  Comment: (NOTE) ?SARS-CoV-2 target nucleic acids are DETECTED. ? ?The SARS-CoV-2 RNA is generally detectable in upper respiratory ?specimens during the acute phase  of infection. Positive results are ?indicative of the presence of the identified virus, but do not rule ?out bacterial infection or co-infection with other pathogens not ?detected by the test. Clinical correlation with patient history and ?other diagnostic information is necessary to determine patient ?infection status. The expected result is Negative. ? ?Fact Sheet for Patients: ?EntrepreneurPulse.com.au ? ?Fact Sheet for Healthcare Providers: ?IncredibleEmployment.be ? ?This test is not yet approved or cleared by the [place] FDA and  ?has been authorized for detection and/or  diagnosis of SARS-CoV-2 by ?FDA under an Emergency Use Authorization (EUA).  This EUA will ?remain in effect (meaning this test can be used) for the duration of  ?the COVID-19 declaration under Section 564(b)(1) of the A ct, 21 ?U.S.C. section 360bbb-3(b)(1), unless the authorization is ?terminated or revoked sooner. ? ?  ? Influenza A by PCR NEGATIVE NEGATIVE Final  ? Influenza B by PCR NEGATIVE NEGATIVE Final  ?  Comment: (NOTE) ?The Xpert Xpress SARS-CoV-2/FLU/RSV plus assay is intended as an aid ?in the diagnosis of influenza from Nasopharyngeal swab specimens and ?should not be used as a sole basis for treatment. Nasal washings and ?aspirates are unacceptable for Xpert Xpress SARS-CoV-2/FLU/RSV ?testing. ? ?Fact Sheet for Patients: ?EntrepreneurPulse.com.au ? ?Fact Sheet for Healthcare Providers: ?IncredibleEmployment.be ? ?This test is not yet approved or cleared by the [place] FDA and ?has been authorized for detection and/or diagnosis of SARS-CoV-2 by ?FDA under an Emergency Use Authorization (EUA). This EUA will remain ?in effect (meaning this test can be used) for the duration of the ?COVID-19 declaration under Section 564(b)(1) of the Act, 21 U.S.C. ?section 360bbb-3(b)(1), unless the authorization is terminated or ?revoked. ? ?Performed at [facility], [address] ?[place], [place] [address] ?  ? ? ?Coagulation Studies: ?No results for input(s): LABPROT, INR in the last 72 hours. ? ?Urinalysis: ?Recent Labs  ?  09/14/21 ?1600  ?COLORURINE YELLOW  ?LABSPEC 1.015  ?PHURINE 5.0  ?GLUCOSEU >=500*  ?HGBUR TRACE*  ?BILIRUBINUR NEGATIVE  ?KETONESUR NEGATIVE  ?PROTEINUR 100*  ?NITRITE NEGATIVE  ?LEUKOCYTESUR NEGATIVE  ?  ? ? ?Imaging: ?DG Chest Port 1 View ? ?Result Date: 09/14/2021 ?CLINICAL DATA:  Dyspnea, hypoxia EXAM: PORTABLE CHEST 1 VIEW COMPARISON:  08/28/2021 chest radiograph. FINDINGS: Stable cardiomediastinal silhouette with top-normal heart size.  No pneumothorax. Small to moderate bilateral pleural effusions, chronic and similar. Hazy bibasilar lung opacities, similar. Streaky curvilinear opacities at the periphery of the mid lungs bilaterally, similar accounting for different technique. IMPRESSION: 1. Chronic small to moderate bilateral pleural effusions. 2. Hazy bibasilar lung opacities and streaky curvilinear opacities at the periphery of the mid lungs bilaterally, favor scarring or atelectasis. Electronically Signed   By: Ilona Sorrel M.D.   On: 09/14/2021 13:14   ? ? ?Medications:  ? ? ? amiodarone  200 mg Oral Daily  ? apixaban  2.5 mg Oral BID  ? atorvastatin  80 mg Oral QHS  ? citalopram  20 mg Oral Daily  ? dapagliflozin propanediol  5 mg Oral Daily  ? diltiazem  180 mg Oral Daily  ? furosemide  80 mg Intravenous BID  ? insulin aspart  0-15 Units Subcutaneous TID AC & HS  ? linagliptin  5 mg Oral Daily  ? pantoprazole  40 mg Oral Daily  ? sodium bicarbonate  650 mg Oral BID  ? tamsulosin  0.4 mg Oral Daily  ? vitamin B-12  500 mcg Oral Daily  ? zinc sulfate  220 mg Oral Daily  ? ?  acetaminophen **OR** acetaminophen, chlorpheniramine-HYDROcodone, guaiFENesin-dextromethorphan, magnesium hydroxide, ondansetron **OR** ondansetron (ZOFRAN) IV, polyethylene glycol, senna, traZODone ? 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  This was placed by Dr. Carlis Abbott 09/04/2021.  There were no absolute indications for hemodialysis at this point. ?ANEMIA-we will check iron studies.  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  His home dose is torsemide 20 mg twice daily.  Consider transitioning to home dose diuretic ?Metabolic acidosis slightly improved with initiation of sodium bicarbonate ? LOS: 1 ?Sherril Croon ?'@TODAY''@6'$ :20 AM ?  ?

## 2021-09-17 LAB — RENAL FUNCTION PANEL
Albumin: 3.1 g/dL — ABNORMAL LOW (ref 3.5–5.0)
Anion gap: 15 (ref 5–15)
BUN: 95 mg/dL — ABNORMAL HIGH (ref 8–23)
CO2: 19 mmol/L — ABNORMAL LOW (ref 22–32)
Calcium: 8.5 mg/dL — ABNORMAL LOW (ref 8.9–10.3)
Chloride: 101 mmol/L (ref 98–111)
Creatinine, Ser: 4.15 mg/dL — ABNORMAL HIGH (ref 0.61–1.24)
GFR, Estimated: 14 mL/min — ABNORMAL LOW (ref >60)
Glucose, Bld: 49 mg/dL — ABNORMAL LOW (ref 70–99)
Phosphorus: 5.8 mg/dL — ABNORMAL HIGH (ref 2.5–4.6)
Potassium: 3.7 mmol/L (ref 3.5–5.1)
Sodium: 135 mmol/L (ref 135–145)

## 2021-09-17 LAB — GLUCOSE, CAPILLARY
Glucose-Capillary: 34 mg/dL — CL (ref 70–99)
Glucose-Capillary: 140 mg/dL — ABNORMAL HIGH (ref 70–99)
Glucose-Capillary: 200 mg/dL — ABNORMAL HIGH (ref 70–99)
Glucose-Capillary: 245 mg/dL — ABNORMAL HIGH (ref 70–99)
Glucose-Capillary: 182 mg/dL — ABNORMAL HIGH (ref 70–99)
Glucose-Capillary: 200 mg/dL — ABNORMAL HIGH (ref 70–99)

## 2021-09-17 MED ORDER — INSULIN ASPART 100 UNIT/ML IJ SOLN
0.0000 [IU] | Freq: Three times a day (TID) | INTRAMUSCULAR | Status: DC
  Administered 2021-09-17: 18:00:00 1 [IU] via SUBCUTANEOUS
  Administered 2021-09-17: 12:00:00 2 [IU] via SUBCUTANEOUS
  Administered 2021-09-18: 3 [IU] via SUBCUTANEOUS
  Administered 2021-09-18: 1 [IU] via SUBCUTANEOUS

## 2021-09-17 MED ORDER — DEXTROSE 50 % IV SOLN
INTRAVENOUS | Status: AC
  Administered 2021-09-17: 01:00:00 25 mL
  Filled 2021-09-17: qty 50

## 2021-09-17 MED ORDER — SODIUM CHLORIDE 0.9 % IV SOLN
250.0000 mg | Freq: Once | INTRAVENOUS | Status: AC
  Administered 2021-09-18: 250 mg via INTRAVENOUS
  Filled 2021-09-17: qty 20

## 2021-09-17 MED ORDER — CALCIUM ACETATE (PHOS BINDER) 667 MG PO CAPS
667.0000 mg | ORAL_CAPSULE | Freq: Three times a day (TID) | ORAL | Status: DC
  Administered 2021-09-17 – 2021-09-18 (×4): 667 mg via ORAL
  Filled 2021-09-17 (×4): qty 1

## 2021-09-17 MED ORDER — SODIUM CHLORIDE 0.9 % IV SOLN
200.0000 mg | INTRAVENOUS | Status: DC
  Administered 2021-09-17: 09:00:00 200 mg via INTRAVENOUS
  Filled 2021-09-17: qty 10

## 2021-09-17 NOTE — Progress Notes (Signed)
Inpatient Diabetes Program Recommendations ? ?AACE/ADA: New Consensus Statement on Inpatient Glycemic Control  ?Target Ranges:  Prepandial:   less than 140 mg/dL ?     Peak postprandial:   less than 180 mg/dL (1-2 hours) ?     Critically ill patients:  140 - 180 mg/dL  ? ? Latest Reference Range & Units 09/17/21 01:24 09/17/21 01:52 09/17/21 06:21  ?Glucose-Capillary 70 - 99 mg/dL 34 (LL) 140 (H) 200 (H)  ? ? Latest Reference Range & Units 09/16/21 06:24 09/16/21 11:33 09/16/21 16:43 09/16/21 21:14  ?Glucose-Capillary 70 - 99 mg/dL 185 (H) 277 (H) 120 (H) 302 (H)  ? ?Review of Glycemic Control ? ?Current orders for Inpatient glycemic control: Novolog 0-15 units AC&HS, Tradjenta 5 mg daily, Farxiga 5 mg daily ? ?Inpatient Diabetes Program Recommendations:   ? ?Insulin: Patient received Novolog 11 units at 21:29 on 09/16/21 for glucose of 302 mg/dl and following glucose down to 34 mg/dl at 1:24 am on 09/17/21. Noted Glipizide was discontinued.  Please consider changing Novolog correction to 0-15 units TID with meals and adding Novolog 0-5 units QHS for bedtime correction. ? ?Thanks, ?Barnie Alderman, RN, MSN, CDE ?Diabetes Coordinator ?Inpatient Diabetes Program ?[phone] (Team Pager from 8am to 5pm) ? ? ? ?

## 2021-09-17 NOTE — Progress Notes (Signed)
[facility] ?ROUNDING NOTE  ? ?Subjective:  ? ?Interval History: This is a 76 year old gentleman with a history of chronic kidney disease stage IV/V.  Diastolic heart failure diabetes hypertension hyperlipidemia.  He presented to the emergency room 09/14/2020 with acute on chronic diastolic heart failure.  He he is followed by [facility] Dr. [name] and his baseline serum creatinine appears to be at 3.5 mg/dL.  His chest x-ray was consistent with pulmonary edema.  He has received intravenous Lasix 80 mg every 12 hours.  He has been transitioned to oral torsemide.  He was incidentally found to be COVID-positive. ? ?Blood pressure 130 667 pulse 81 temperature 97.9 O2 sats 93% on room air ? ?Sodium 135 potassium 3.7 chloride 101 CO2 90 BUN 95 creatinine 4.5 calcium 8.5 phosphorus 5.8 albumin 3.1 hemoglobin 8.8 ? ?Urine output 800 cc 09/16/2021 ? ?Objective:  ?Vital signs in last 24 hours:  ?Temp:  [97.6 ?F (36.4 ?C)-98.7 ?F (37.1 ?C)] 97.9 ?F (36.6 ?C) (03/09 0401) ?Pulse Rate:  [78-84] 78 (03/09 0401) ?Resp:  [14-27] 14 (03/09 0401) ?BP: (121-144)/(63-87) 136/67 (03/09 0401) ?SpO2:  [93 %-98 %] 93 % (03/09 0401) ?Weight:  [54 kg] 54 kg (03/09 0401) ? ?Weight change: 0.8 kg ?Filed Weights  ? 09/15/21 0452 09/16/21 0358 09/17/21 0401  ?Weight: 53.8 kg 53.2 kg 54 kg  ? ? ?Intake/Output: ?I/O last 3 completed shifts: ?In: 1320 [P.O.:1320] ?Out: 1075 [Urine:1075] ?  ?Intake/Output this shift: ? No intake/output data recorded. ? ?CVS- RRR no murmurs rubs gallops ?RS- CTA improved with clear to auscultation ?ABD- BS present soft non-distended ?EXT-no edema ? ? ?Basic Metabolic Panel: ?Recent Labs  ?Lab 09/14/21 ?1210 09/15/21 ?0058 09/16/21 ?4854 09/17/21 ?0052  ?NA 134* 131* 130* 135  ?K 4.2 3.9 3.6 3.7  ?CL 102 101 98 101  ?CO2 17* 16* 18* 19*  ?GLUCOSE 246* 274* 213* 49*  ?BUN 68* 67* 87* 95*  ?CREATININE 3.23* 3.41* 3.70* 4.15*  ?CALCIUM 8.6* 8.4* 8.4* 8.5*  ?PHOS  --   --   --  5.8*   ? ? ? ?Liver Function Tests: ?Recent Labs  ?Lab 09/14/21 ?1210 09/15/21 ?0058 09/16/21 ?6270 09/17/21 ?0052  ?AST '29 18 18  '$ --   ?ALT '29 25 23  '$ --   ?ALKPHOS 210* 195* 172*  --   ?BILITOT 0.6 0.6 0.3  --   ?PROT 7.8 7.5 7.4  --   ?ALBUMIN 3.3* 3.0* 3.0* 3.1*  ? ? ?No results for input(s): LIPASE, AMYLASE in the last 168 hours. ?No results for input(s): AMMONIA in the last 168 hours. ? ?CBC: ?Recent Labs  ?Lab 09/11/21 ?1235 09/14/21 ?1210 09/15/21 ?0058 09/16/21 ?3500  ?WBC  --  12.8* 13.8* 12.3*  ?NEUTROABS  --  11.7* 13.3*  --   ?HGB 9.5* 9.7* 9.0* 8.8*  ?HCT  --  30.1* 27.9* 27.6*  ?MCV  --  84.3 83.8 83.6  ?PLT  --  367 355 375  ? ? ? ?Cardiac Enzymes: ?No results for input(s): CKTOTAL, CKMB, CKMBINDEX, TROPONINI in the last 168 hours. ? ?BNP: ?Invalid input(s): POCBNP ? ?CBG: ?Recent Labs  ?Lab 09/16/21 ?1643 09/16/21 ?2114 09/17/21 ?0124 09/17/21 ?9381 09/17/21 ?8299  ?GLUCAP 120* 302* 34* 140* 200*  ? ? ? ?Microbiology: ?Results for orders placed or performed during the hospital encounter of 09/14/21  ?Resp Panel by RT-PCR (Flu A&B, Covid) Nasopharyngeal Swab     Status: Abnormal  ? Collection Time: 09/14/21  1:51 PM  ? Specimen: Nasopharyngeal Swab; Nasopharyngeal(NP)  swabs in vial transport medium  ?Result Value Ref Range Status  ? SARS Coronavirus 2 by RT PCR POSITIVE (A) NEGATIVE Final  ?  Comment: (NOTE) ?SARS-CoV-2 target nucleic acids are DETECTED. ? ?The SARS-CoV-2 RNA is generally detectable in upper respiratory ?specimens during the acute phase of infection. Positive results are ?indicative of the presence of the identified virus, but do not rule ?out bacterial infection or co-infection with other pathogens not ?detected by the test. Clinical correlation with patient history and ?other diagnostic information is necessary to determine patient ?infection status. The expected result is Negative. ? ?Fact Sheet for Patients: ?EntrepreneurPulse.com.au ? ?Fact Sheet for Healthcare  Providers: ?IncredibleEmployment.be ? ?This test is not yet approved or cleared by the [place] FDA and  ?has been authorized for detection and/or diagnosis of SARS-CoV-2 by ?FDA under an Emergency Use Authorization (EUA).  This EUA will ?remain in effect (meaning this test can be used) for the duration of  ?the COVID-19 declaration under Section 564(b)(1) of the A ct, 21 ?U.S.C. section 360bbb-3(b)(1), unless the authorization is ?terminated or revoked sooner. ? ?  ? Influenza A by PCR NEGATIVE NEGATIVE Final  ? Influenza B by PCR NEGATIVE NEGATIVE Final  ?  Comment: (NOTE) ?The Xpert Xpress SARS-CoV-2/FLU/RSV plus assay is intended as an aid ?in the diagnosis of influenza from Nasopharyngeal swab specimens and ?should not be used as a sole basis for treatment. Nasal washings and ?aspirates are unacceptable for Xpert Xpress SARS-CoV-2/FLU/RSV ?testing. ? ?Fact Sheet for Patients: ?EntrepreneurPulse.com.au ? ?Fact Sheet for Healthcare Providers: ?IncredibleEmployment.be ? ?This test is not yet approved or cleared by the [place] FDA and ?has been authorized for detection and/or diagnosis of SARS-CoV-2 by ?FDA under an Emergency Use Authorization (EUA). This EUA will remain ?in effect (meaning this test can be used) for the duration of the ?COVID-19 declaration under Section 564(b)(1) of the Act, 21 U.S.C. ?section 360bbb-3(b)(1), unless the authorization is terminated or ?revoked. ? ?Performed at [facility], [address] ?[place], [place] [address] ?  ? ? ?Coagulation Studies: ?No results for input(s): LABPROT, INR in the last 72 hours. ? ?Urinalysis: ?Recent Labs  ?  09/14/21 ?1600  ?COLORURINE YELLOW  ?LABSPEC 1.015  ?PHURINE 5.0  ?GLUCOSEU >=500*  ?HGBUR TRACE*  ?BILIRUBINUR NEGATIVE  ?KETONESUR NEGATIVE  ?PROTEINUR 100*  ?NITRITE NEGATIVE  ?LEUKOCYTESUR NEGATIVE  ? ?  ? ? ?Imaging: ?No results found. ? ? ?Medications:  ? ? ?  amiodarone  200 mg Oral Daily  ? apixaban  2.5 mg Oral BID  ? atorvastatin  80 mg Oral QHS  ? citalopram  20 mg Oral Daily  ? dapagliflozin propanediol  5 mg Oral Daily  ? diltiazem  180 mg Oral Daily  ? ferrous sulfate  325 mg Oral Q breakfast  ? insulin aspart  0-15 Units Subcutaneous TID AC & HS  ? linagliptin  5 mg Oral Daily  ? pantoprazole  40 mg Oral Daily  ? sodium bicarbonate  650 mg Oral BID  ? tamsulosin  0.4 mg Oral Daily  ? torsemide  20 mg Oral Daily  ? vitamin B-12  500 mcg Oral Daily  ? zinc sulfate  220 mg Oral Daily  ? ?acetaminophen **OR** acetaminophen, chlorpheniramine-HYDROcodone, guaiFENesin-dextromethorphan, magnesium hydroxide, ondansetron **OR** ondansetron (ZOFRAN) IV, polyethylene glycol, senna, traZODone ? 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  This was placed by Dr. Carlis Abbott 09/04/2021.  There were no absolute indications for hemodialysis  at this point. ?ANEMIA-we will check iron studies.  May need to initiate ESA.  Iron sats 12% we will initiate IV iron. 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 LOS: 2 ?Sherril Croon ?'@TODAY''@7'$ :31 AM ?  ?

## 2021-09-17 NOTE — Care Plan (Addendum)
Hypoglycemic Event ? ?CBG: 34 ? ?Treatment: 8 oz juice/soda and D50 25 mL (12.5 gm) ? ?Symptoms: None ? ?Follow-up CBG: Time: 0153 CBG Result:140 ? ?Possible Reasons for Event: Unknown ? ?Comments/MD notified:Hypoglycemia protocol enacted ? ?[place] interpreter used to assess patient. Patient denies any hypoglycemia symptoms and states via interpreter "I am fine." ? ? ?Adam Reid Y ? ? ?

## 2021-09-17 NOTE — Progress Notes (Addendum)
?Progress Note ? ? ?Patient: Adam Reid URK:[id number] DOB: [birth year] DOA: 09/14/2021     2 ?DOS: the patient was seen and examined on 09/17/2021 ?  ?Brief hospital course: ?Mr. Adam Reid was admitted to the hospital with the working diagnosis of acute on chronic diastolic heart failure in the setting of CKD stage IV to V.  ? ?76 yo male with the past medical history of diastolic heart failure, G3TD, CKD stage IV to V, hypertension, dyslipidemia, and hypothyroidism, who presented with dyspnea. Reported worsening dyspnea on exertion for the last few days into his hospitalization, associated with cough and orthopnea. Recently reduced his dose of torsemide. On his initial physical examination his blood pressure was 146/76, RR 32, HR 82, 02 saturation 100% on supplemental 02 per [place] 3 L/min, he had decreased breath sounds bilaterally with rales and no wheezing, heart with S1 and S2 present and regular with no gallops, abdomen soft and non distended, no lower extremity edema.  ? ?Na 134, K 4,2 CL 102, bicarb 17, glucose 246, bun 68, cr 3,23  ?BNP 1,427  ?High sensitive troponin 44 and 41  ?Wbc 12,8. Hgb 9,7, hct 30 and plt 367  ? ?SARS covid 19 positive (positive COVID 19 on 08/2021)  ?Urine analysis with specific gravity 1,015  ? ?Chest radiograph with bilateral pleural effusions, with interstitial infiltrates bilateral lower lobes, fluid in the right fissure and left upper lobe peripheral atelectasis.  ? ?EKG 75 bpm, normal axis, qtc 512, sinus rhythm with PAC, poor R wave progression and LVH, with no significant ST segment or T wave changes.  ? ?Patient was placed on IV diuresis with improvement of his symptoms.  ? ?Pending renal function to be more stable before patient can be discharged home.  ? ?Assessment and Plan: ?* Acute on chronic diastolic CHF (congestive heart failure) ([place]) ?Echocardiogram from 07/2021 his LV EF was preserved 55 to 60% with preserved RV systolic function. No significant valvular disease.   ? ?Clinically euvolemic.  ?Systolic blood pressure in the 130 to 140 mmHg.  ? ?Continue medical therapy with dapagliflozin.  ?Plan to transition to oral torsemide at the time of his discharge.  ? ? ? 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  ?Hyponatremia.  ?Non anion gap metabolic acidosis.  ?Patient with improved volume status. ? ?Renal function today with a serum cr at 4,15 with K at 3,7 and serum bicarbonate at 19, with BUN 95.  ?P is 5,8  ? ?Plan to follow up renal function in am.  ?Avoid hypotension and nephrotoxic medications.  ?Continue with oral sodium bicarbonate and add phosphate binders.  ? ?Acute respiratory failure with hypoxia ([place]) ?Patient with improved volume status. ?Oxymetry is 98% on room air. ?Respiratory failure has resolved.  ? ?Paroxysmal A-fib ([place]) ?Patient continue sinus rhythm, with positive extra beats. ?On diltiazem and amiodarone for rate and rhythm control.  ?Anticoagulation with apixaban.  ? ?HTN (hypertension) ?Continue with diltiazem.  ?Plan to resume diuresis at the time of discharge when serum cr more stable.  ? ?Type 2 diabetes mellitus with hyperlipidemia ([place]) ?Uncontrolled diabetes with hyperglycemia/ hypoglycemia.  ?Fasting glucose this am 49. ? ?Will discontinue linagliptin.    ?Plan to decrease intensity of sliding scale.  ?Continue close glucose monitoring.  ?With worsening renal function patient is at risk of hypoglycemia.  ? ?COVID-19 virus infection ?Recent infection in 08/2021, no clinical signs of active infection. ?No isolation required.  ? ?BPH (benign prostatic  hyperplasia) ?No clinical signs of urinary retention continue with tamsulosin.  ? ?Iron deficiency anemia ?hgb is 8,8 and hct 27,9 ?Iron panel with serum iron 26, TIBC 211, transferrin saturation 12 and ferritin 834 consistent with anemia of chronic disease with iron deficiency.   ? ?His ferritin has been chronically elevated and his  transferrin saturation has been slowly decreasing, now to less than 20.  ?Note patient had covid 19 last month.  ? ?Continue IV iron for repletion, patient will likely need EPO when iron stores are replenished.  ? ?Hyperlipidemia LDL goal <100-resolved as of 09/16/2021 ?- We will continue statin therapy. ? ? ? ? ?  ? ?Subjective: Patient is feeling better with no dyspnea or chest pain, no edema, no nausea or vomiting  ? ?Physical Exam: ?Vitals:  ? 09/16/21 1943 09/16/21 2324 09/17/21 0401 09/17/21 4259  ?BP: 136/87 (!) 141/74 136/67 139/77  ?Pulse: 82 84 78 79  ?Resp: (!) '27 14 14 16  '$ ?Temp: 97.8 ?F (36.6 ?C) 97.6 ?F (36.4 ?C) 97.9 ?F (36.6 ?C) 97.6 ?F (36.4 ?C)  ?TempSrc: Oral Oral Oral Oral  ?SpO2: 95% 95% 93% 95%  ?Weight:   54 kg   ?Height:      ? ?Neurology awake and alert ?ENT with mild pallor  ?Cardiovascular with S1 and S2 present and rhythmic, positive systolic murmur 3/6 at the apex. ?No JVD ?No lower extremity edema.  ?Respiratory with no rales or wheezing ?Abdomen soft and non tender.  ?Data Reviewed: ? ? ? ?Family Communication: I spoke over the phone with the patient's wife about patient's  condition, plan of care, prognosis and all questions were addressed.  ? ?Disposition: ?Status is: Inpatient ?Remains inpatient appropriate because: renal function not stable yet.  ? Planned Discharge Destination: Home ? ? ? ?Author: ?Tawni Millers, MD ?09/17/2021 8:56 AM ? ?For on call review [email or web address].  ?

## 2021-09-17 NOTE — Progress Notes (Signed)
?  Mobility Specialist Criteria Algorithm Info. ? ? 09/17/21 1000  ?Mobility  ?Activity Ambulated independently in hallway  ?Range of Motion/Exercises Active;All extremities  ?Level of Assistance Independent  ?Assistive Device None  ?Distance Ambulated (ft) 200 ft  ?Activity Response Tolerated well  ? ?Patient progressing well with mobility. Ambulated in hallway independently with steady gait. Tolerated without complaint or incident. Was left dangling EOB with all needs met, call bell in reach. ? ?09/17/2021 ?3:05 PM ? ?[place] Daion Ginsberg, CMS, BS EXP ?Acute Rehabilitation Services  ?ACZYS:[phone] ?Office: [phone] ? ?

## 2021-09-17 NOTE — Progress Notes (Signed)
Discussed with wife that he has given bath and she will call him to let him know we have to measure his urine.  I verified with wife in room that he had no complaints or questions from the assessment.  ?

## 2021-09-17 NOTE — Plan of Care (Signed)
Discussed with patient plan of care for the evening, pain management and hydration with some teach back displayed ? ?Problem: Education: ?Goal: Ability to demonstrate management of disease process will improve ?Outcome: Progressing ?  ?

## 2021-09-18 LAB — RENAL FUNCTION PANEL
Albumin: 2.9 g/dL — ABNORMAL LOW (ref 3.5–5.0)
Anion gap: 14 (ref 5–15)
BUN: 91 mg/dL — ABNORMAL HIGH (ref 8–23)
CO2: 19 mmol/L — ABNORMAL LOW (ref 22–32)
Calcium: 8.3 mg/dL — ABNORMAL LOW (ref 8.9–10.3)
Chloride: 102 mmol/L (ref 98–111)
Creatinine, Ser: 3.95 mg/dL — ABNORMAL HIGH (ref 0.61–1.24)
GFR, Estimated: 15 mL/min — ABNORMAL LOW (ref >60)
Glucose, Bld: 220 mg/dL — ABNORMAL HIGH (ref 70–99)
Phosphorus: 5.2 mg/dL — ABNORMAL HIGH (ref 2.5–4.6)
Potassium: 3.7 mmol/L (ref 3.5–5.1)
Sodium: 135 mmol/L (ref 135–145)

## 2021-09-18 LAB — GLUCOSE, CAPILLARY
Glucose-Capillary: 186 mg/dL — ABNORMAL HIGH (ref 70–99)
Glucose-Capillary: 260 mg/dL — ABNORMAL HIGH (ref 70–99)

## 2021-09-18 MED ORDER — TORSEMIDE 20 MG PO TABS
20.0000 mg | ORAL_TABLET | Freq: Two times a day (BID) | ORAL | Status: DC
  Administered 2021-09-18: 20 mg via ORAL
  Filled 2021-09-18: qty 1

## 2021-09-18 MED ORDER — CALCIUM ACETATE (PHOS BINDER) 667 MG PO CAPS: 667.0000 mg | ORAL_CAPSULE | Freq: Three times a day (TID) | ORAL | 0 refills | Status: AC

## 2021-09-18 MED ORDER — SODIUM BICARBONATE 650 MG PO TABS: 650.0000 mg | ORAL_TABLET | Freq: Two times a day (BID) | ORAL | 0 refills | Status: AC

## 2021-09-18 NOTE — Discharge Summary (Signed)
Physician Discharge Summary   Patient: Adam Reid MRN: [id number] DOB: Dec 20, 1945  Admit date:     09/14/2021  Discharge date: 09/18/21  Discharge Physician: Adam Reid   PCP: Adam Agreste, MD   Recommendations at discharge:    Patient will resume torsemide 20 mg daily Plan to follow up renal function in 7 days as outpatient Patient received IV iron, will hold on oral iron and plan to check iron stores as outpatient. Discontinue glipizide to prevent hypoglycemia.   Discharge Diagnoses: Principal Problem:   Acute on chronic diastolic CHF (congestive heart failure) (HCC) Active Problems:   Acute kidney injury superimposed on chronic kidney disease (HCC)   Acute respiratory failure with hypoxia (HCC)   Paroxysmal A-fib (HCC)   HTN (hypertension)   Type 2 diabetes mellitus with hyperlipidemia (HCC)   COVID-19 virus infection   BPH (benign prostatic hyperplasia)   Iron deficiency anemia  Resolved Problems:   * No resolved hospital problems. 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 Reported worsening dyspnea on exertion for the last few days into his hospitalization, associated with cough and orthopnea. Recently reduced his dose of torsemide. On his initial physical examination his blood pressure was 146/76, RR 32, HR 82, 02 saturation 100% on supplemental 02 per [place] 3 L/min, he had decreased breath sounds bilaterally with rales and no wheezing, heart with S1 and S2 present and regular with no gallops, abdomen soft and non distended, no lower extremity edema.   Na 134, K 4,2 CL 102, bicarb 17, glucose 246, bun 68, cr 3,23  BNP 1,427  High sensitive troponin 44 and 41   Wbc 12,8. Hgb 9,7, hct 30 and plt 367   SARS covid 19 positive (positive COVID 19 on 08/2021)  Urine analysis with specific gravity 1,015   Chest radiograph with bilateral pleural effusions, with interstitial infiltrates bilateral lower lobes, fluid in the right fissure and left upper lobe peripheral atelectasis.   EKG 75 bpm, normal axis, qtc 512, sinus rhythm with PAC, poor R wave progression and LVH, with no significant ST segment or T wave changes.   Patient was placed on IV diuresis with improvement of his symptoms.   Patient developed AKI, that improved with supportive medical therapy. At the time of his discharge his renal function is improving. Plan to follow up as outpatient.   Assessment and Plan: * Acute on chronic diastolic CHF (congestive heart failure) ([place]) Patient was admitted to the cardiac ward and was placed on furosemide for diuresis.  Negative fluid balance was achieved with improvement of his symptoms.   Further work up with echocardiogram from 07/2021 his LV EF was preserved 55 to 60% with preserved RV systolic function. No significant valvular disease.   At discharge patient will resume oral torsemide 20 mg daily.  Continue with dapagliflizin.    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  Hyponatremia.  Non anion gap metabolic acidosis.   His renal function was closely monitored.   His peak creatinine reached 4,15. Diuresis was held with improvement on serum cr, at the time of his discharge is 3,95 with K od 3,7 and serum  bicarbonate at 19. P 5.2   Patient will be discharge home with oral torsemide 20 mg daily. Oral sodium bicarbonate and phoslo.  Follow up with Adam Reid from nephrology as outpatient.   Acute respiratory failure with hypoxia ([place]) Patient with improved volume status. Oxymetry is 98% on room air. Respiratory failure has resolved.   Paroxysmal A-fib ([place]) Patient  continue sinus rhythm, with positive extra beats. He was continued with diltiazem and amiodarone for rate and rhythm control.  Anticoagulation with apixaban.   HTN (hypertension) Continue with diltiazem.  At discharge will resume taking torsemide for diuresis.    Type 2 diabetes mellitus with hyperlipidemia ([place]) Uncontrolled diabetes with hyperglycemia/ hypoglycemia.   Patient was placed on insulin therapy for glucose control, sliding scale. Linagliptin and glipizide were held due to hypoglycemia. At the time of his discharge his fasting glucose is 220. He will resume sitagliptin but will recommend to hold on glipizide to prevent hypoglycemia.   COVID-19 virus infection Patient tested positive for COVID 19 on admission.  Recent infection in 08/2021, no clinical signs of active infection. No isolation was required.   BPH (benign prostatic hyperplasia) No clinical signs of urinary retention continue with tamsulosin.   Iron deficiency anemia hgb is 8,8 and hct 27,9 Iron panel with serum iron 26, TIBC 211, transferrin saturation 12 and ferritin 834 consistent with anemia of chronic disease with iron deficiency.    His ferritin has been chronically elevated and his transferrin saturation has been slowly decreasing, now to less than 20.  Note patient had covid 19 last month.   Patient received IV iron during his hospitalization, plan to follow up on iron panel as outpatient.   Hyperlipidemia LDL goal <100-resolved as of 09/16/2021 - We will continue statin therapy.         Consultants: nephrology  Procedures performed: none   Disposition: Home Diet recommendation:  Discharge Diet Orders (From admission, onward)     Start     Ordered   09/18/21 0000  Diet - low sodium heart healthy        09/18/21 1761           Cardiac diet DISCHARGE MEDICATION: Allergies as of 09/18/2021       Reactions   Pork-derived Products         Medication List     STOP taking  these medications    ferric citrate 1 GM 210 MG(Fe) tablet Commonly known as: AURYXIA   glipiZIDE 5 MG tablet Commonly known as: GLUCOTROL       TAKE these medications    amiodarone 200 MG tablet Commonly known as: PACERONE Take 1 tablet (200 mg total) by mouth daily. Follow up appt required for further refills. Please call [phone] to schedule an appt What changed: additional instructions   atorvastatin 80 MG tablet Commonly known as: LIPITOR TAKE 1 TABLET(80 MG) BY MOUTH DAILY What changed:  how much to take how to take this when to take this additional instructions   blood glucose meter kit and supplies Use up to two times daily as directed  ICD10 E10.9 E11.9   calcium acetate 667 MG capsule Commonly known as: PHOSLO Take 1 capsule (667 mg total) by mouth 3 (three) times daily with meals.   citalopram 20 MG tablet Commonly known as: CELEXA Take 20 mg by mouth daily.   diltiazem 180 MG 24 hr capsule Commonly known as: CARDIZEM CD TAKE 1 CAPSULE(180 MG) BY MOUTH DAILY What changed: See the new instructions.  Eliquis 2.5 MG Tabs tablet Generic drug: apixaban TAKE 1 TABLET(2.5 MG) BY MOUTH TWICE DAILY What changed: See the new instructions.   Farxiga 5 MG Tabs tablet Generic drug: dapagliflozin propanediol TAKE 1 TABLET(5 MG) BY MOUTH DAILY What changed: See the new instructions.   GlucoCom Lancets Misc Use for home glucose monitoring   OneTouch Verio test strip Generic drug: glucose blood Test up to 2 times per day.  Uncontrolled diabetes with hyperglycemia and stage 4 CKD.   pantoprazole 40 MG tablet Commonly known as: PROTONIX TAKE 1 TABLET(40 MG) BY MOUTH DAILY What changed: See the new instructions.   polyethylene glycol 17 g packet Commonly known as: MIRALAX / GLYCOLAX Take 17 g by mouth daily as needed for mild constipation or moderate constipation.   sitaGLIPtin 25 MG tablet Commonly known as: Januvia TAKE 1 TABLET(25 MG) BY MOUTH  DAILY What changed:  how much to take how to take this when to take this additional instructions   sodium bicarbonate 650 MG tablet Take 1 tablet (650 mg total) by mouth 2 (two) times daily.   tamsulosin 0.4 MG Caps capsule Commonly known as: FLOMAX TAKE 2 CAPSULES(0.8 MG) BY MOUTH DAILY What changed: See the new instructions.   torsemide 20 MG tablet Commonly known as: DEMADEX Take 1 tablet (20 mg total) by mouth 2 (two) times daily. Temporary increase to 1 tablet twice daily until advised to decrease by his nephrologist. What changed:  when to take this additional instructions   vitamin B-12 500 MCG tablet Commonly known as: CYANOCOBALAMIN Take 500 mcg by mouth daily.   VITAMIN D3 PO Take 1 tablet by mouth daily.        Follow-up Information     Rhetta Mura, MD Follow up in 1 week(s).   Specialty: Internal Medicine Contact information: [address] [place] [address] [phone]         Elmarie Shiley, MD Follow up.   Specialty: Nephrology Why: as scheduled Contact information: [place] [address] [phone]                Discharge Exam: Filed Weights   09/16/21 0358 09/17/21 0401 09/18/21 0405  Weight: 53.2 kg 54 kg 54.4 kg   BP 139/79 (BP Location: Left Wrist)    Pulse 90    Temp 98.3 F (36.8 C) (Oral)    Resp 18    Ht _0  (1.676 m)    Wt 54.4 kg    SpO2 95%    BMI 19.36 kg/m   Patient is feeling better, no dyspnea or edema  Neurology awake and alert ENT with no pallor Cardiovascular with S1 and S2 present with no gallops or rubs Respiratory with no rales Abdomen soft No lower extremity edema.   Condition at discharge: stable  The results of significant diagnostics from this hospitalization (including imaging, microbiology, ancillary and laboratory) are listed below for reference.   Imaging Studies: DG Chest 2 View  Result Date: 08/28/2021 CLINICAL DATA:  cough past month, hx diastolic chf,  prior pleural effusion. EXAM: CHEST - 2 VIEW COMPARISON:  Radiograph 06/24/2020 FINDINGS: Unchanged cardiomediastinal silhouette. Moderate-sized, layering bilateral pleural effusions with adjacent atelectasis. There are band-like atelectasis/scarring along the periphery of the lungs. No visible pneumothorax. No acute osseous abnormality. IMPRESSION: Moderate-sized, layering bilateral pleural effusions with adjacent atelectasis. Electronically Signed   By: Maurine Simmering M.D.   On: 08/28/2021 13:20   DG Chest Port 1 View  Result Date:  09/14/2021 CLINICAL DATA:  Dyspnea, hypoxia EXAM: PORTABLE CHEST 1 VIEW COMPARISON:  08/28/2021 chest radiograph. FINDINGS: Stable cardiomediastinal silhouette with top-normal heart size. No pneumothorax. Small to moderate bilateral pleural effusions, chronic and similar. Hazy bibasilar lung opacities, similar. Streaky curvilinear opacities at the periphery of the mid lungs bilaterally, similar accounting for different technique. IMPRESSION: 1. Chronic small to moderate bilateral pleural effusions. 2. Hazy bibasilar lung opacities and streaky curvilinear opacities at the periphery of the mid lungs bilaterally, favor scarring or atelectasis. Electronically Signed   By: Ilona Sorrel M.D.   On: 09/14/2021 13:14    Microbiology: Results for orders placed or performed during the hospital encounter of 09/14/21  Resp Panel by RT-PCR (Flu A&B, Covid) Nasopharyngeal Swab     Status: Abnormal   Collection Time: 09/14/21  1:51 PM   Specimen: Nasopharyngeal Swab; Nasopharyngeal(NP) swabs in vial transport medium  Result Value Ref Range Status   SARS Coronavirus 2 by RT PCR POSITIVE (A) NEGATIVE Final    Comment: (NOTE) SARS-CoV-2 target nucleic acids are DETECTED.  The SARS-CoV-2 RNA is generally detectable in upper respiratory specimens during the acute phase of infection. Positive results are indicative of the presence of the identified virus, but do not rule out bacterial  infection or co-infection with other pathogens not detected by the test. Clinical correlation with patient history and other diagnostic information is necessary to determine patient infection status. The expected result is Negative.  Fact Sheet for Patients: EntrepreneurPulse.com.au  Fact Sheet for Healthcare Providers: IncredibleEmployment.be  This test is not yet approved or cleared by the [place] FDA and  has been authorized for detection and/or diagnosis of SARS-CoV-2 by FDA under an Emergency Use Authorization (EUA).  This EUA will remain in effect (meaning this test can be used) for the duration of  the COVID-19 declaration under Section 564(b)(1) of the A ct, 21 U.S.C. section 360bbb-3(b)(1), unless the authorization is terminated or revoked sooner.     Influenza A by PCR NEGATIVE NEGATIVE Final   Influenza B by PCR NEGATIVE NEGATIVE Final    Comment: (NOTE) The Xpert Xpress SARS-CoV-2/FLU/RSV plus assay is intended as an aid in the diagnosis of influenza from Nasopharyngeal swab specimens and should not be used as a sole basis for treatment. Nasal washings and aspirates are unacceptable for Xpert Xpress SARS-CoV-2/FLU/RSV testing.  Fact Sheet for Patients: EntrepreneurPulse.com.au  Fact Sheet for Healthcare Providers: IncredibleEmployment.be  This test is not yet approved or cleared by the [place] FDA and has been authorized for detection and/or diagnosis of SARS-CoV-2 by FDA under an Emergency Use Authorization (EUA). This EUA will remain in effect (meaning this test can be used) for the duration of the COVID-19 declaration under Section 564(b)(1) of the Act, 21 U.S.C. section 360bbb-3(b)(1), unless the authorization is terminated or revoked.  Performed at [facility] [address] [place], [place] [address]     Labs: CBC: Recent Labs  Lab 09/11/21 1235  09/14/21 1210 09/15/21 0058 09/16/21 0057  WBC  --  12.8* 13.8* 12.3*  NEUTROABS  --  11.7* 13.3*  --   HGB 9.5* 9.7* 9.0* 8.8*  HCT  --  30.1* 27.9* 27.6*  MCV  --  84.3 83.8 83.6  PLT  --  367 355 606   Basic Metabolic Panel: Recent Labs  Lab 09/14/21 1210 09/15/21 0058 09/16/21 0057 09/17/21 0052 09/18/21 0117  NA 134* 131* 130* 135 135  K 4.2 3.9 3.6 3.7 3.7  CL 102 101  98 101 102  CO2 17* 16* 18* 19* 19*  GLUCOSE 246* 274* 213* 49* 220*  BUN 68* 67* 87* 95* 91*  CREATININE 3.23* 3.41* 3.70* 4.15* 3.95*  CALCIUM 8.6* 8.4* 8.4* 8.5* 8.3*  PHOS  --   --   --  5.8* 5.2*   Liver Function Tests: Recent Labs  Lab 09/14/21 1210 09/15/21 0058 09/16/21 0057 09/17/21 0052 09/18/21 0117  AST _0 --   --   ALT _1 --   --   ALKPHOS 210* 195* 172*  --   --   BILITOT 0.6 0.6 0.3  --   --   PROT 7.8 7.5 7.4  --   --   ALBUMIN 3.3* 3.0* 3.0* 3.1* 2.9*   CBG: Recent Labs  Lab 09/17/21 0621 09/17/21 1127 09/17/21 1622 09/17/21 2115 09/18/21 0612  GLUCAP 200* 245* 182* 200* 186*    Discharge time spent: greater than 30 minutes.  Signed: Tawni Millers, MD [facility] 09/18/2021

## 2021-09-18 NOTE — Progress Notes (Signed)
Adam Reid ?ROUNDING NOTE  ? ?Subjective:  ? ?Interval History: This is a 76 year old gentleman with a history of chronic kidney disease stage IV/V.  Diastolic heart failure diabetes hypertension hyperlipidemia.  He presented to the emergency room 09/14/2020 with acute on chronic diastolic heart failure.  He he is followed by [facility] Dr. [name] and his baseline serum creatinine appears to be at 3.5 mg/dL.  His chest x-ray was consistent with pulmonary edema.  He has received intravenous Lasix 80 mg every 12 hours.  He has been transitioned to oral torsemide.  He was incidentally found to be COVID-positive. ? ?Blood pressure 171/69 pulse 83 temperature 98.3 O2 sats 95% room air ? ?Sodium 135 potassium 3.7 chloride 102 CO2 19 BUN 91 creatinine 3.95 glucose 220 calcium 8.3 phosphorus 5.2 albumin 2.9 ? ?Urine output 1225 cc 09/17/2021 ? ?Objective:  ?Vital signs in last 24 hours:  ?Temp:  [97.6 ?F (36.4 ?C)-98.3 ?F (36.8 ?C)] 98.3 ?F (36.8 ?C) (03/10 0405) ?Pulse Rate:  [76-85] 84 (03/10 0405) ?Resp:  [12-21] 20 (03/10 0405) ?BP: (139-171)/(69-87) 171/69 (03/10 0405) ?SpO2:  [91 %-95 %] 95 % (03/10 0405) ?Weight:  [54.4 kg] 54.4 kg (03/10 0405) ? ?Weight change: 0.4 kg ?Filed Weights  ? 09/16/21 0358 09/17/21 0401 09/18/21 0405  ?Weight: 53.2 kg 54 kg 54.4 kg  ? ? ?Intake/Output: ?I/O last 3 completed shifts: ?In: 1200 [P.O.:1200] ?Out: 1975 [[id number]] ?  ?Intake/Output this shift: ? No intake/output data recorded. ? ?CVS- RRR no murmurs rubs gallops ?RS- CTA improved with clear to auscultation ?ABD- BS present soft non-distended ?EXT-no edema ? ? ?Basic Metabolic Panel: ?Recent Labs  ?Lab 09/14/21 ?1210 09/15/21 ?0058 09/16/21 ?7673 09/17/21 ?4193 09/18/21 ?0117  ?NA 134* 131* 130* 135 135  ?K 4.2 3.9 3.6 3.7 3.7  ?CL 102 101 98 101 102  ?CO2 17* 16* 18* 19* 19*  ?GLUCOSE 246* 274* 213* 49* 220*  ?BUN 68* 67* 87* 95* 91*  ?CREATININE 3.23* 3.41* 3.70* 4.15* 3.95*  ?CALCIUM 8.6* 8.4* 8.4*  8.5* 8.3*  ?PHOS  --   --   --  5.8* 5.2*  ? ? ? ?Liver Function Tests: ?Recent Labs  ?Lab 09/14/21 ?1210 09/15/21 ?0058 09/16/21 ?7902 09/17/21 ?4097 09/18/21 ?0117  ?AST '29 18 18  '$ --   --   ?ALT '29 25 23  '$ --   --   ?ALKPHOS 210* 195* 172*  --   --   ?BILITOT 0.6 0.6 0.3  --   --   ?PROT 7.8 7.5 7.4  --   --   ?ALBUMIN 3.3* 3.0* 3.0* 3.1* 2.9*  ? ? ?No results for input(s): LIPASE, AMYLASE in the last 168 hours. ?No results for input(s): AMMONIA in the last 168 hours. ? ?CBC: ?Recent Labs  ?Lab 09/11/21 ?1235 09/14/21 ?1210 09/15/21 ?0058 09/16/21 ?3532  ?WBC  --  12.8* 13.8* 12.3*  ?NEUTROABS  --  11.7* 13.3*  --   ?HGB 9.5* 9.7* 9.0* 8.8*  ?HCT  --  30.1* 27.9* 27.6*  ?MCV  --  84.3 83.8 83.6  ?PLT  --  367 355 375  ? ? ? ?Cardiac Enzymes: ?No results for input(s): CKTOTAL, CKMB, CKMBINDEX, TROPONINI in the last 168 hours. ? ?BNP: ?Invalid input(s): POCBNP ? ?CBG: ?Recent Labs  ?Lab 09/17/21 ?0621 09/17/21 ?1127 09/17/21 ?1622 09/17/21 ?2115 09/18/21 ?0612  ?GLUCAP 200* 245* 182* 200* 186*  ? ? ? ?Microbiology: ?Results for orders placed or performed during the hospital encounter of 09/14/21  ?Resp Panel  by RT-PCR (Flu A&B, Covid) Nasopharyngeal Swab     Status: Abnormal  ? Collection Time: 09/14/21  1:51 PM  ? Specimen: Nasopharyngeal Swab; Nasopharyngeal(NP) swabs in vial transport medium  ?Result Value Ref Range Status  ? SARS Coronavirus 2 by RT PCR POSITIVE (A) NEGATIVE Final  ?  Comment: (NOTE) ?SARS-CoV-2 target nucleic acids are DETECTED. ? ?The SARS-CoV-2 RNA is generally detectable in upper respiratory ?specimens during the acute phase of infection. Positive results are ?indicative of the presence of the identified virus, but do not rule ?out bacterial infection or co-infection with other pathogens not ?detected by the test. Clinical correlation with patient history and ?other diagnostic information is necessary to determine patient ?infection status. The expected result is Negative. ? ?Fact Sheet for  Patients: ?EntrepreneurPulse.com.au ? ?Fact Sheet for Healthcare Providers: ?IncredibleEmployment.be ? ?This test is not yet approved or cleared by the [place] FDA and  ?has been authorized for detection and/or diagnosis of SARS-CoV-2 by ?FDA under an Emergency Use Authorization (EUA).  This EUA will ?remain in effect (meaning this test can be used) for the duration of  ?the COVID-19 declaration under Section 564(b)(1) of the A ct, 21 ?U.S.C. section 360bbb-3(b)(1), unless the authorization is ?terminated or revoked sooner. ? ?  ? Influenza A by PCR NEGATIVE NEGATIVE Final  ? Influenza B by PCR NEGATIVE NEGATIVE Final  ?  Comment: (NOTE) ?The Xpert Xpress SARS-CoV-2/FLU/RSV plus assay is intended as an aid ?in the diagnosis of influenza from Nasopharyngeal swab specimens and ?should not be used as a sole basis for treatment. Nasal washings and ?aspirates are unacceptable for Xpert Xpress SARS-CoV-2/FLU/RSV ?testing. ? ?Fact Sheet for Patients: ?EntrepreneurPulse.com.au ? ?Fact Sheet for Healthcare Providers: ?IncredibleEmployment.be ? ?This test is not yet approved or cleared by the [place] FDA and ?has been authorized for detection and/or diagnosis of SARS-CoV-2 by ?FDA under an Emergency Use Authorization (EUA). This EUA will remain ?in effect (meaning this test can be used) for the duration of the ?COVID-19 declaration under Section 564(b)(1) of the Act, 21 U.S.C. ?section 360bbb-3(b)(1), unless the authorization is terminated or ?revoked. ? ?Performed at [facility], [address], [place] [address] ?  ? ? ?Coagulation Studies: ?No results for input(s): LABPROT, INR in the last 72 hours. ? ?Urinalysis: ?No results for input(s): COLORURINE, LABSPEC, [place], GLUCOSEU, HGBUR, BILIRUBINUR, KETONESUR, PROTEINUR, UROBILINOGEN, NITRITE, LEUKOCYTESUR in the last 72 hours. ? ?Invalid input(s): APPERANCEUR ?   ? ? ?Imaging: ?No results found. ? ? ?Medications:  ? ? ferric gluconate (FERRLECIT) IVPB    ? ? amiodarone  200 mg Oral Daily  ? apixaban  2.5 mg Oral BID  ? atorvastatin  80 mg Oral QHS  ? calcium acetate  667 mg Oral TID WC  ? citalopram  20 mg Oral Daily  ? dapagliflozin propanediol  5 mg Oral Daily  ? diltiazem  180 mg Oral Daily  ? insulin aspart  0-6 Units Subcutaneous TID WC  ? pantoprazole  40 mg Oral Daily  ? sodium bicarbonate  650 mg Oral BID  ? tamsulosin  0.4 mg Oral Daily  ? vitamin B-12  500 mcg Oral Daily  ? zinc sulfate  220 mg Oral Daily  ? ?acetaminophen **OR** acetaminophen, chlorpheniramine-HYDROcodone, guaiFENesin-dextromethorphan, magnesium hydroxide, ondansetron **OR** ondansetron (ZOFRAN) IV, polyethylene glycol, senna, traZODone ? 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  This was placed by Dr. Carlis Abbott 09/04/2021.  There were no absolute indications  for hemodialysis at this point. ?ANEMIA-we will check iron studies.  May need to initiate ESA.  Iron sats 12% we will initiate IV iron.  We will continue to follow hemoglobin. 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  Calcium acetate 660 mg with meals ?HTN/VOL-was on her home dose of torsemide which was discontinued.  We will need to continue as an outpatient ?Metabolic acidosis slightly improved with initiation of sodium bicarbonate ? LOS: 3 ?Sherril Croon ?'@TODAY''@7'$ :15 AM ?  ?

## 2021-09-21 ENCOUNTER — Encounter (HOSPITAL_COMMUNITY): Payer: Self-pay

## 2021-09-24 ENCOUNTER — Ambulatory Visit (INDEPENDENT_AMBULATORY_CARE_PROVIDER_SITE_OTHER): Payer: Medicare Other | Admitting: Family Medicine

## 2021-09-24 VITALS — BP 136/68 | HR 76 | Temp 98.2°F | Wt 119.6 lb

## 2021-09-24 DIAGNOSIS — N184 Chronic kidney disease, stage 4 (severe): Secondary | ICD-10-CM | POA: Diagnosis not present

## 2021-09-24 DIAGNOSIS — D638 Anemia in other chronic diseases classified elsewhere: Secondary | ICD-10-CM | POA: Diagnosis not present

## 2021-09-24 DIAGNOSIS — E1122 Type 2 diabetes mellitus with diabetic chronic kidney disease: Secondary | ICD-10-CM | POA: Diagnosis not present

## 2021-09-24 DIAGNOSIS — I5033 Acute on chronic diastolic (congestive) heart failure: Secondary | ICD-10-CM

## 2021-09-24 DIAGNOSIS — D509 Iron deficiency anemia, unspecified: Secondary | ICD-10-CM | POA: Diagnosis not present

## 2021-09-24 MED ORDER — GLIPIZIDE 5 MG PO TABS
2.5000 mg | ORAL_TABLET | Freq: Two times a day (BID) | ORAL | 3 refills | Status: DC
Start: 2021-09-24 — End: 2021-11-16

## 2021-09-24 NOTE — Progress Notes (Signed)
? ?Subjective:  ?Patient ID: Adam Reid, male    DOB: Nov 30, 1945  Age: 76 y.o. MRN: [id number] ? ?CC:  ?Chief Complaint  ?Patient presents with  ? Follow-up  ?  He was hospitalized last week for shortness of breath, he was advised to follow up with his PCP ?Concerns about fatigue, and weight loss   ? Depression  ?  PHQ9 score of 13 last time discussed medication changed and was denied at that time   ? ? ?HPI ?Adam Reid presents for  ? ?Hospital follow-up.here with spouse, translating.  ?Admitted March 6 through March 10. ? ? ?Acute on chronic CHF ?Initially presented to ER with increased dyspnea on exertion.  Cough.  Orthopnea.  Had recently reduced his dose of torsemide.  Chest x-ray with bilateral pleural effusions, interstitial infiltrates bilateral lower lobes and fluid in the right fissure, left upper lobe peripheral atelectasis.  He was placed on IV diuresis with improvement of symptoms.  He did develop AKI on CKD that improved with supportive medical therapy.  Renal function was improving at discharge.  Negative fluid balance obtained.  Discharged on oral torsemide 20 mg daily and continued on dapagliflozin. ?Still torsemide $RemoveBeforeDE'20mg'eOMLqJbOmjcHXPC$  daily.  ?Last 2 days feels like more short of breath - increased to 2 torsemide per day past 2 days, breathing better today. No chest pain. No fever. Min cough - improved.  ?Cardiology appt 3/27.  ?Wt Readings from Last 3 Encounters:  ?09/24/21 119 lb 9.6 oz (54.3 kg)  ?09/18/21 119 lb 14.9 oz (54.4 kg)  ?09/04/21 123 lb 0.3 oz (55.8 kg)  ? ? ?AKI on CKD ?CKD stage IV-V with hyponatremia and nonanion gap metabolic acidosis. ?Peak creatinine 4.15.  Diuresis was held with improvement of serum creatinine and at time of discharge 3.95 with potassium 3.7, bicarb 19.  Torsemide 20 mg daily as above, on oral sodium bicarbonate and PhosLo.  Follow-up planned with Dr. [name] with nephrology as outpatient. ?Urinating normally.  ?Appt with Dr. [name] in April.  ? ?Acute respiratory  failure with hypoxia ?Improved with improved volume status.  O2 sat 98% on room air and respiratory failure resolved. ?Chest x-ray 09/14/2021: ?IMPRESSION: ?1. Chronic small to moderate bilateral pleural effusions. ?2. Hazy bibasilar lung opacities and streaky curvilinear opacities ?at the periphery of the mid lungs bilaterally, favor scarring or ?Atelectasis. ?  ?Uncontrolled diabetes with hyperglycemia and hypoglycemia.  ?Treated with insulin during hospitalization for glucose control, sliding scale.  Linagliptin and glipizide were held due to hypoglycemia.  Fasting glucose 220 at discharge.  Sitagliptin was resumed, but held glipizide to prevent hypoglycemia. ?Currently taking Januvia $RemoveBefore'25mg'NwtNXKUfcxrWH$  qd, farxiga $RemoveBef'5mg'czWdHXYFqW$  qd.  ?Home readings elevated out of hospital - fasting 280, up to 400's after breakfast.  ?No lows since leaving hospital. Lowest 260.  ? ?Lab Results  ?Component Value Date  ? HGBA1C 7.9 (H) 08/27/2021  ? ?Iron deficiency anemia ?Hemoglobin 8.8, hematocrit 27.9.  Anemia of chronic disease with iron deficiency.  Received IV iron during hospitalization with follow-up iron panel as outpatient planned. ?Lab Results  ?Component Value Date  ? WBC 12.3 (H) 09/16/2021  ? HGB 8.8 (L) 09/16/2021  ? HCT 27.6 (L) 09/16/2021  ? MCV 83.6 09/16/2021  ? PLT 375 09/16/2021  ? ?Lab Results  ?Component Value Date  ? IRON 26 (L) 09/15/2021  ? TIBC 211 (L) 09/15/2021  ? FERRITIN 834 (H) 09/15/2021  ? ? ? ? ?History ?Patient Active Problem List  ? Diagnosis Date Noted  ? Iron deficiency anemia 09/16/2021  ?  Acute on chronic diastolic CHF (congestive heart failure) ([place]) 09/14/2021  ? COVID-19 virus infection 09/14/2021  ? Acute kidney injury superimposed on chronic kidney disease ([place]) 09/14/2021  ? Chronic kidney disease (CKD), stage IV (severe) ([place]) 06/09/2021  ? Symptomatic anemia 12/23/2020  ? GI bleed 12/23/2020  ? Paroxysmal A-fib ([place]) 12/23/2020  ? Chronic diastolic CHF (congestive heart failure) ([place]) 12/23/2020  ? Acute CHF  (congestive heart failure) ([place]) 09/24/2019  ? ARF (acute renal failure) ([place]) 09/24/2019  ? Anemia of chronic disease 09/24/2019  ? Acute respiratory failure with hypoxia ([place])   ? Secondary hyperparathyroidism, renal ([place]) 12/29/2016  ? Smoker 03/26/2016  ? Hypotension 01/29/2016  ? Abnormal myocardial perfusion study 01/29/2016  ? Depression 12/09/2015  ? Type 2 diabetes mellitus with hyperlipidemia ([place])   ? HTN (hypertension) 06/24/2014  ? 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 BPH (benign prostatic hyperplasia) 06/24/2014  ? ?Past Medical History:  ?Diagnosis Date  ? Anemia   ? CHF (congestive heart failure) ([place])   ? Chronic kidney disease   ? Depression   ? Diabetes mellitus without complication ([place])   ? Type II  ? GERD (gastroesophageal reflux disease)   ? History of blood transfusion   ? Hyperlipidemia   ? Hypertension   ? Thyroid disease   ? was on supplement, taken off by [name]  ? ?Past Surgical History:  ?Procedure Laterality Date  ? APPENDECTOMY    ? AV FISTULA PLACEMENT Right 09/04/2021  ? Procedure: RIGHT BRACHIOCEPHALIC ARTERIOVENOUS (AV) FISTULA CREATION;  Surgeon: [name], MD;  Location: [facility];  Service: Vascular;  Laterality: Right;  ? BIOPSY  12/25/2020  ? Procedure: BIOPSY;  Surgeon: [name]., MD;  Location: [facility];  Service: Gastroenterology;;  ? CARDIAC CATHETERIZATION N/A 01/30/2016  ? Procedure: Left Heart Cath and Coronary Angiography;  Surgeon: [name], MD;  Location: [facility];  Service: Cardiovascular;  Laterality: N/A;  ? ENTEROSCOPY N/A 12/25/2020  ? Procedure: ENTEROSCOPY;  Surgeon: [name], MD;  Location: [facility];  Service: Gastroenterology;  Laterality: N/A;  ? GIVENS CAPSULE STUDY N/A 12/25/2020  ? Procedure: GIVENS CAPSULE STUDY;  Surgeon: [name]., MD;  Location: [facility];  Service: Gastroenterology;  Laterality: N/A;  ? SUBMUCOSAL  TATTOO INJECTION  12/25/2020  ? Procedure: SUBMUCOSAL TATTOO INJECTION;  Surgeon: [name]., MD;  Location: [facility];  Service: Gastroenterology;;  ? ?Allergies  ?Allergen Reactions  ? Pork-Derived Products   ? ?Prior to Admission medications   ?Medication Sig Start Date End Date Taking? Authorizing Provider  ?amiodarone (PACERONE) 200 MG tablet Take 1 tablet (200 mg total) by mouth daily. Follow up appt required for further refills. Please call [phone] to schedule an appt ?Patient taking differently: Take 200 mg by mouth daily. 01/28/21  Yes Bensimhon, Shaune Pascal, MD  ?atorvastatin (LIPITOR) 80 MG tablet TAKE 1 TABLET(80 MG) BY MOUTH DAILY ?Patient taking differently: Take 80 mg by mouth at bedtime. 12/22/20  Yes [name], MD  ?blood glucose meter kit and supplies Use up to two times daily as directed  ICD10 E10.9 E11.9 02/20/19  Yes [name], MD  ?calcium acetate (PHOSLO) 667 MG capsule Take 1 capsule (667 mg total) by mouth 3 (three) times daily with meals. 09/18/21 10/18/21 Yes Arrien, Jimmy Picket, MD  ?Cholecalciferol (VITAMIN D3 PO) Take 1 tablet by mouth daily.   Yes [provider]  ?citalopram (  CELEXA) 20 MG tablet Take 20 mg by mouth daily.   Yes [provider]  ?diltiazem (CARDIZEM CD) 180 MG 24 hr capsule TAKE 1 CAPSULE(180 MG) BY MOUTH DAILY ?Patient taking differently: Take 180 mg by mouth daily. 08/24/21  Yes Bensimhon, Shaune Pascal, MD  ?ELIQUIS 2.5 MG TABS tablet TAKE 1 TABLET(2.5 MG) BY MOUTH TWICE DAILY ?Patient taking differently: Take 2.5 mg by mouth 2 (two) times daily. 01/15/21  Yes Bensimhon, Shaune Pascal, MD  ?FARXIGA 5 MG TABS tablet TAKE 1 TABLET(5 MG) BY MOUTH DAILY ?Patient taking differently: Take 5 mg by mouth daily. 04/16/21  Yes [name], MD  ?GlucoCom Lancets MISC Use for home glucose monitoring 09/02/15  Yes Harrison Mons, PA  ?glucose blood (ONETOUCH VERIO) test strip Test up to 2 times per day.  ?Uncontrolled diabetes with  hyperglycemia and stage 4 CKD. 04/13/19  Yes [name], MD  ?pantoprazole (PROTONIX) 40 MG tablet TAKE 1 TABLET(40 MG) BY MOUTH DAILY ?Patient taking differently: Take 40 mg by mouth daily. 04/27/21  Y

## 2021-09-24 NOTE — Patient Instructions (Addendum)
Call and schedule hospital follow up with Dr. [name] as soon as possible.  ?I will recheck kidney test today due to recent change in torsemide.  ?I recommend daily weights to track fluid status. If any return of shortness of breath - be seen. Bring copy of weight to cardiology visit.  ?Restart glipizide 2.'5mg'$  twice per day with meals, watch for any low blood sugars and let me know if there are any lows.  ?Recheck in 2 weeks. Take care! ? ?Return to the clinic or go to the nearest emergency room if any of your symptoms worsen or new symptoms occur. ? ? ? ? ? ?

## 2021-09-25 ENCOUNTER — Telehealth: Payer: Self-pay

## 2021-09-25 LAB — CBC
HCT: 33.1 % — ABNORMAL LOW (ref 39.0–52.0)
Hemoglobin: 10.6 g/dL — ABNORMAL LOW (ref 13.0–17.0)
MCHC: 32.1 g/dL (ref 30.0–36.0)
MCV: 84.6 fl (ref 78.0–100.0)
Platelets: 391 10*3/uL (ref 150.0–400.0)
RBC: 3.91 Mil/uL — ABNORMAL LOW (ref 4.22–5.81)
RDW: 17.8 % — ABNORMAL HIGH (ref 11.5–15.5)
WBC: 13 10*3/uL — ABNORMAL HIGH (ref 4.0–10.5)

## 2021-09-25 LAB — BASIC METABOLIC PANEL
BUN: 83 mg/dL — ABNORMAL HIGH (ref 6–23)
CO2: 23 mEq/L (ref 19–32)
Calcium: 9.5 mg/dL (ref 8.4–10.5)
Chloride: 95 mEq/L — ABNORMAL LOW (ref 96–112)
Creatinine, Ser: 3.8 mg/dL — ABNORMAL HIGH (ref 0.40–1.50)
GFR: 14.96 mL/min — CL (ref >60.00)
Glucose, Bld: 216 mg/dL — ABNORMAL HIGH (ref 70–99)
Potassium: 4.1 mEq/L (ref 3.5–5.1)
Sodium: 135 mEq/L (ref 135–145)

## 2021-09-25 NOTE — Telephone Encounter (Signed)
Noted see lab notes.  ?

## 2021-09-25 NOTE — Telephone Encounter (Signed)
Critical Lab call today 09/25/2021  ? ?Pts GFR was reported as 14.96  ?

## 2021-09-26 ENCOUNTER — Encounter: Payer: Self-pay | Admitting: Family Medicine

## 2021-10-05 ENCOUNTER — Ambulatory Visit (HOSPITAL_COMMUNITY)
Admission: RE | Admit: 2021-10-05 | Discharge: 2021-10-05 | Disposition: A | Discharge status: Home / Self Care | Payer: Medicare Other | Source: Ambulatory Visit | Attending: Family Medicine | Admitting: Family Medicine

## 2021-10-05 ENCOUNTER — Other Ambulatory Visit: Payer: Self-pay

## 2021-10-05 ENCOUNTER — Encounter (HOSPITAL_COMMUNITY): Payer: Self-pay

## 2021-10-05 VITALS — BP 152/64 | HR 84 | Wt 124.6 lb

## 2021-10-05 DIAGNOSIS — D509 Iron deficiency anemia, unspecified: Secondary | ICD-10-CM | POA: Insufficient documentation

## 2021-10-05 DIAGNOSIS — I10 Essential (primary) hypertension: Secondary | ICD-10-CM | POA: Diagnosis not present

## 2021-10-05 DIAGNOSIS — Z79899 Other long term (current) drug therapy: Secondary | ICD-10-CM | POA: Insufficient documentation

## 2021-10-05 DIAGNOSIS — Z87891 Personal history of nicotine dependence: Secondary | ICD-10-CM | POA: Diagnosis not present

## 2021-10-05 DIAGNOSIS — Z7901 Long term (current) use of anticoagulants: Secondary | ICD-10-CM | POA: Diagnosis not present

## 2021-10-05 DIAGNOSIS — Z8616 Personal history of COVID-19: Secondary | ICD-10-CM | POA: Diagnosis not present

## 2021-10-05 DIAGNOSIS — I480 Paroxysmal atrial fibrillation: Secondary | ICD-10-CM | POA: Insufficient documentation

## 2021-10-05 DIAGNOSIS — Z7984 Long term (current) use of oral hypoglycemic drugs: Secondary | ICD-10-CM | POA: Insufficient documentation

## 2021-10-05 DIAGNOSIS — R0902 Hypoxemia: Secondary | ICD-10-CM

## 2021-10-05 DIAGNOSIS — N184 Chronic kidney disease, stage 4 (severe): Secondary | ICD-10-CM | POA: Diagnosis not present

## 2021-10-05 DIAGNOSIS — I4892 Unspecified atrial flutter: Secondary | ICD-10-CM

## 2021-10-05 DIAGNOSIS — U071 COVID-19: Secondary | ICD-10-CM | POA: Diagnosis not present

## 2021-10-05 DIAGNOSIS — I5032 Chronic diastolic (congestive) heart failure: Secondary | ICD-10-CM | POA: Diagnosis present

## 2021-10-05 DIAGNOSIS — I2510 Atherosclerotic heart disease of native coronary artery without angina pectoris: Secondary | ICD-10-CM | POA: Diagnosis not present

## 2021-10-05 DIAGNOSIS — I130 Hypertensive heart and chronic kidney disease with heart failure and stage 1 through stage 4 chronic kidney disease, or unspecified chronic kidney disease: Secondary | ICD-10-CM | POA: Diagnosis not present

## 2021-10-05 DIAGNOSIS — N179 Acute kidney failure, unspecified: Secondary | ICD-10-CM | POA: Diagnosis not present

## 2021-10-05 DIAGNOSIS — E1122 Type 2 diabetes mellitus with diabetic chronic kidney disease: Secondary | ICD-10-CM | POA: Diagnosis not present

## 2021-10-05 DIAGNOSIS — I484 Atypical atrial flutter: Secondary | ICD-10-CM | POA: Insufficient documentation

## 2021-10-05 MED ORDER — TORSEMIDE 20 MG PO TABS: 40.0000 mg | ORAL_TABLET | Freq: Two times a day (BID) | ORAL | 6 refills | Status: AC

## 2021-10-05 NOTE — Patient Instructions (Addendum)
Thank you for coming in today ? ?STOP Farxiga ? ?INCREASE Torsemide to 40 mg 2 tablets twice daily ? ?Your physician recommends that you return for lab work in: 1 week ? ?You have been referred to Endocrinology their office will call you for further details  ? ?You have been referred to [facility] for oxygen [facility] and their office will contact you for further information ? ?Your physician recommends that you schedule a follow-up appointment in:  ?2 weeks in clinic  ?3-4 months with dr. Haroldine Laws ? ?At the [facility], you and your health needs are our priority. As part of our continuing mission to provide you with exceptional heart care, we have created designated Provider Care Teams. These Care Teams include your primary Cardiologist (physician) and Advanced Practice Providers (APPs- Physician Assistants and Nurse Practitioners) who all work together to provide you with the care you need, when you need it.  ? ?You may see any of the following providers on your designated Care Team at your next follow up: ?Dr Glori Bickers ?Dr Loralie Champagne ?Darrick Grinder, NP ?Lyda Jester, PA ?Jessica Milford,NP ?Marlyce Huge, PA ?Audry Riles, PharmD ? ? ?Please be sure to bring in all your medications bottles to every appointment.  ? ?If you have any questions or concerns before your next appointment please send [place] a message through [place] or call our office at [phone].   ? ?TO LEAVE A MESSAGE FOR THE NURSE SELECT OPTION 2, PLEASE LEAVE A MESSAGE INCLUDING: ?YOUR NAME ?DATE OF BIRTH ?CALL BACK NUMBER ?REASON FOR CALL**this is important as we prioritize the call backs ? ?YOU WILL RECEIVE A CALL BACK THE SAME DAY AS LONG AS YOU CALL BEFORE 4:00 PM ? ?

## 2021-10-05 NOTE — Progress Notes (Addendum)
? ?ADVANCED HF CLINIC PROGRESS NOTE ? ? ?Primary Care: Merri Ray MD [facility]) ?Primary Cardiologist: Dr. Margaretann Loveless ?HF Cardiologist: Dr. Haroldine Laws  ? ?HPI: ?Adam Reid is a 76 y.o. male from [place] with a h/o a tobacco abuse (quit 2021), HTN, DM2, CKD 4 (Cr ~0.8), diastolic HF, PAF and iron-deficiency anemia, recently referred by Dr. 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  ? ?Had [facility]/2016 for CP evaluation revealing mild nonobstructive CAD, LAD 20%, D2 50%, OM 20% ? ?Had [facility] in 2019. Not too many symptoms except for fatigue.  ? ?Hospitalized 1/44-81/85 for diastolic HF. Diuresed and weight down to 129 pounds. Sent home on lasix 60 daily. Echo LVEF 55-60%, RV ok. Restrictive filling pattern. There was also concern about cardiac amyloidosis given restrictive filling pattern on echo.  Multiple myeloma panel obtained and immunofixation did not demonstrate monoclonal protein spike. PYP completed and negative for TTR amyloid.  ? ?Zio 6/21 ?1. Sinus rhythm predominates - avg HR of 80 bpm. ?2. Two runs NSVT - the longest lasting 6 beats with an avg rate of 124 bpm. ?3. 99 Supraventricular Tachycardia/atrial tach runs occurred the longest lasting 45.5 secs with an avg rate of 105 bpm. ?4. No evidence of atrial fib ?5. Rare PVCs and PACs.  ?6. Several patient-triggered events associated with sinus rhythm  ? ?In 6/22 had capsule endoscopy which showed several small bowel AVMs. Previous EGD and colon ok.  ? ?Follow up 11/22, doing well with maintenance of NSR, volume stable. Discussed HD if renal function deteriorates. ? ?Echo (1/23): EF 55-60%, mild LVH, normal RV. ? ?S/p right AVF placement (2/23). ? ?Admitted 3/23 with a/c CHF, complicated by AKI on CKD. Incidentally found to be COVID +. Diuresed with IV lasix. Nephrology consulted but no absolute indication for HD. Discharged on home dose torsemide 20 mg, weight 119 lbs. ? ?Today he returns for post hospital HF follow up with his son. He is more  SOB and fatigued. He increased his torsemide and felt some better initially. He is SOB with minimal activity. Denies CP, dizziness, edema, or PND/Orthopnea. Appetite ok. No fever or chills. Weight at home 117-119 pounds. Taking all medications. Son asking for referral to Endocrinology and asking if patient would be well enough for trip to [place]. ? ?Past Medical History:  ?Diagnosis Date  ? Anemia   ? CHF (congestive heart failure) ([place])   ? Chronic kidney disease   ? Depression   ? Diabetes mellitus without complication ([place])   ? Type II  ? GERD (gastroesophageal reflux disease)   ? History of blood transfusion   ? Hyperlipidemia   ? Hypertension   ? Thyroid disease   ? was on supplement, taken off by [name]  ? ? ?Current Outpatient Medications  ?Medication Sig Dispense Refill  ? amiodarone (PACERONE) 200 MG tablet Take 1 tablet (200 mg total) by mouth daily. Follow up appt required for further refills. Please call [phone] to schedule an appt 30 tablet 4  ? atorvastatin (LIPITOR) 80 MG tablet TAKE 1 TABLET(80 MG) BY MOUTH DAILY 90 tablet 1  ? blood glucose meter kit and supplies Use up to two times daily as directed  ICD10 E10.9 E11.9 1 each 0  ? calcium acetate (PHOSLO) 667 MG capsule Take 1 capsule (667 mg total) by mouth 3 (three) times daily with meals. 90 capsule 0  ? Cholecalciferol (VITAMIN D3 PO) Take 1 tablet by mouth daily.    ? citalopram (CELEXA) 20 MG tablet  Take 20 mg by mouth daily.    ? diltiazem (CARDIZEM CD) 180 MG 24 hr capsule TAKE 1 CAPSULE(180 MG) BY MOUTH DAILY 60 capsule 2  ? ELIQUIS 2.5 MG TABS tablet TAKE 1 TABLET(2.5 MG) BY MOUTH TWICE DAILY 180 tablet 1  ? FARXIGA 5 MG TABS tablet TAKE 1 TABLET(5 MG) BY MOUTH DAILY 30 tablet 5  ? glipiZIDE (GLUCOTROL) 5 MG tablet Take 0.5 tablets (2.5 mg total) by mouth 2 (two) times daily before a meal. 60 tablet 3  ? GlucoCom Lancets MISC Use for home glucose monitoring 100 each 3  ? glucose blood (ONETOUCH VERIO) test strip Test up to 2 times per  day.  ?Uncontrolled diabetes with hyperglycemia and stage 4 CKD. 100 each 3  ? pantoprazole (PROTONIX) 40 MG tablet TAKE 1 TABLET(40 MG) BY MOUTH DAILY 30 tablet 3  ? polyethylene glycol (MIRALAX / GLYCOLAX) 17 g packet Take 17 g by mouth daily as needed for mild constipation or moderate constipation.     ? sitaGLIPtin (JANUVIA) 25 MG tablet TAKE 1 TABLET(25 MG) BY MOUTH DAILY 90 tablet 1  ? sodium bicarbonate 650 MG tablet Take 1 tablet (650 mg total) by mouth 2 (two) times daily. 60 tablet 0  ? tamsulosin (FLOMAX) 0.4 MG CAPS capsule Take 0.4 mg by mouth daily.    ? torsemide (DEMADEX) 20 MG tablet Take 20 mg by mouth 2 (two) times daily.    ? vitamin B-12 (CYANOCOBALAMIN) 500 MCG tablet Take 500 mcg by mouth daily.    ? ?No current facility-administered medications for this encounter.  ? ? ?Allergies  ?Allergen Reactions  ? Pork-Derived Products   ? ? ?  ?Social History  ? ?Socioeconomic History  ? Marital status: Married  ?  Spouse name: [name]  ? Number of children: 1  ? Years of education: 12th grade  ? Highest education level: Not on file  ?Occupational History  ? Occupation: Retired-accountant  ?Tobacco Use  ? Smoking status: Light Smoker  ?  Packs/day: 0.10  ?  Years: 48.00  ?  Pack years: 4.80  ?  Types: Cigarettes  ?  Last attempt to quit: 07/16/2019  ?  Years since quitting: 2.2  ? Smokeless tobacco: Never  ? Tobacco comments:  ?  09/03/22 Wife states, "he hides from me, so I am not sure how much he smokes."  ?Vaping Use  ? Vaping Use: Never used  ?Substance and Sexual Activity  ? Alcohol use: No  ?  Alcohol/week: 0.0 standard drinks  ? Drug use: No  ? Sexual activity: Never  ?  Partners: Female  ?Other Topics Concern  ? Not on file  ?Social History Narrative  ? Originally from [place]. Came to the [place] in 2009, following their son who came here for school.  ? Married.  ? Lives with his wife.  ? Their adult son lives in [place], [place], where he is in optometry school.  ? Education: [employer]  ? Exercise: No   ? ?Social Determinants of Health  ? ?Financial Resource Strain: Not on file  ?Food Insecurity: Not on file  ?Transportation Needs: Not on file  ?Physical Activity: Not on file  ?Stress: Not on file  ?Social Connections: Not on file  ?Intimate Partner Violence: Not on file  ? ?Family History  ?Problem Relation Age of Onset  ? Hypertension Mother   ? Hyperlipidemia Mother   ? Hypertension Sister   ? Kidney disease Father   ? Heart disease Brother 63  ?  open heart surgery  ? Colon cancer Neg Hx   ? ?BP (!) 152/64   Pulse 84   Wt 56.5 kg   SpO2 91%   BMI 20.11 kg/m?  ? ?Wt Readings from Last 3 Encounters:  ?10/05/21 56.5 kg  ?09/24/21 54.3 kg  ?09/18/21 54.4 kg  ? ?Physical Exam: ?General:  NAD. No resp difficulty, arrived in [facility], thin. ?HEENT: Pale. ?Neck: Supple. JVP to ear. Carotids 2+ bilat; no bruits. No lymphadenopathy or thryomegaly appreciated. ?Cor: PMI nondisplaced. Regular rate & rhythm. No rubs, gallops or murmurs. ?Lungs: Diminished ?Abdomen: Soft, nontender, nondistended. No hepatosplenomegaly. No bruits or masses. Good bowel sounds. ?Extremities: No cyanosis, clubbing, rash, trace BLE edema ?Neuro: Alert & oriented x 3, cranial nerves grossly intact. Moves all 4 extremities w/o difficulty. Affect pleasant. ? ?ECG (personally reviewed): NSR 83 bpm ? ?Assessment & Plan: ?1.Chronic Diastolic HF ?- Echo 6/89: EF 55-60% with restrictive filling pattern ?- PYP scan 4/21 negative for TTR amyloid  ?- Based on history, ECG, echo findings (normal RV) and concomitant renal failure, suspect this is due to longstanding HTN +/- DM2 changes  ?- Echo (1/23): EF 55-60%, mild LVH, normal RV. Much improved with maintenance of NSR. ?- NYHA IIIb. Volume status up, weight up 5 lbs. ?- Increase torsemide to 40 mg bid. ?- GFR 14.9, stop [place]. ?- Labs from PCP visit last week reviewed, SCr  3.8, K 4.1 ?- BMET/BNP 1 week. ? ?2. 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 ?-  Continue amiodarone 200 mg daily. ?- Continue Eliquis 2.5 bid. No bleeding issues. ?- Hgb 10.6 on labs at PCP visit last week. ? ?3. HTN ?- Elevated today. ?- Diurese as above. ? ?4. CKD Stage IV  ?- due to

## 2021-10-05 NOTE — Progress Notes (Signed)
SATURATION QUALIFICATIONS: (This note is used to comply with regulatory documentation for home oxygen) ? ?Patient Saturations on Room Air at Rest = 85% ? ?Patient Saturations on Room Air while Ambulating = 85% ? ?Patient Saturations on 2 Liters of oxygen while Ambulating = 94% ? ?Please briefly explain why patient needs home oxygen: patient walking sitting on room air oxygen saturations are 85% ?

## 2021-10-06 ENCOUNTER — Telehealth (HOSPITAL_COMMUNITY): Payer: Self-pay

## 2021-10-06 NOTE — Telephone Encounter (Signed)
Patient's demographics and stationary concentrator RX  were successfully faxed to: [facility] at [phone] for TC, RN which was the number provided..  ? ?

## 2021-10-08 ENCOUNTER — Ambulatory Visit: Payer: Medicare Other | Admitting: Family Medicine

## 2021-10-09 ENCOUNTER — Other Ambulatory Visit (HOSPITAL_COMMUNITY): Payer: Self-pay

## 2021-10-09 ENCOUNTER — Encounter (HOSPITAL_COMMUNITY)
Admission: RE | Admit: 2021-10-09 | Discharge: 2021-10-09 | Disposition: A | Discharge status: Home / Self Care | Payer: Medicare Other | Source: Ambulatory Visit | Attending: Nephrology | Admitting: Nephrology

## 2021-10-09 ENCOUNTER — Telehealth: Payer: Self-pay

## 2021-10-09 VITALS — BP 178/72 | HR 81 | Temp 97.8°F | Resp 20

## 2021-10-09 DIAGNOSIS — D631 Anemia in chronic kidney disease: Secondary | ICD-10-CM | POA: Diagnosis not present

## 2021-10-09 DIAGNOSIS — N184 Chronic kidney disease, stage 4 (severe): Secondary | ICD-10-CM | POA: Diagnosis present

## 2021-10-09 DIAGNOSIS — N179 Acute kidney failure, unspecified: Secondary | ICD-10-CM

## 2021-10-09 LAB — POCT HEMOGLOBIN-HEMACUE: Hemoglobin: 9.7 g/dL — ABNORMAL LOW (ref 13.0–17.0)

## 2021-10-09 MED ORDER — EPOETIN ALFA-EPBX 10000 UNIT/ML IJ SOLN
10000.0000 [IU] | INTRAMUSCULAR | Status: DC
  Administered 2021-10-09: 10000 [IU] via SUBCUTANEOUS

## 2021-10-09 MED ORDER — EPOETIN ALFA-EPBX 10000 UNIT/ML IJ SOLN
INTRAMUSCULAR | Status: DC
Start: 2021-10-09 — End: 2021-10-10
  Filled 2021-10-09: qty 1

## 2021-10-09 NOTE — Telephone Encounter (Signed)
Changed appt to 11/06/21 ?

## 2021-10-09 NOTE — Telephone Encounter (Signed)
I appreciate the updates.  We can push out his visit for a few more weeks, but please schedule follow-up before first week of May so we can review his home blood sugar readings. ?

## 2021-10-09 NOTE — Telephone Encounter (Signed)
Pt wife called in and reported on some patients recent health.  ?[place] office stopped pts Farxiga and increased glipizide to '5mg'$  daily and BG has improved at home.  ?Pt had apt with Cardiology today went well Hemoglobin up to 9.7 today was lower at patels most recently.  ?appt for iron infusions scheduled 10/24/21 and 11/06/21  ? ?Pt wife wants to know with him improving and feeling better in comparison do they need to keep apt here on 10/12/21? Can we push this out or do you think it is still best to follow up.  ? ?

## 2021-10-11 ENCOUNTER — Other Ambulatory Visit: Payer: Self-pay

## 2021-10-11 DIAGNOSIS — N184 Chronic kidney disease, stage 4 (severe): Secondary | ICD-10-CM

## 2021-10-12 ENCOUNTER — Ambulatory Visit: Payer: Medicare Other | Admitting: Family Medicine

## 2021-10-14 ENCOUNTER — Ambulatory Visit (HOSPITAL_COMMUNITY)
Admission: RE | Admit: 2021-10-14 | Discharge: 2021-10-14 | Disposition: A | Discharge status: Home / Self Care | Payer: Medicare Other | Source: Ambulatory Visit | Attending: Cardiology | Admitting: Cardiology

## 2021-10-14 DIAGNOSIS — I5032 Chronic diastolic (congestive) heart failure: Secondary | ICD-10-CM | POA: Diagnosis not present

## 2021-10-14 LAB — BASIC METABOLIC PANEL
Anion gap: 13 (ref 5–15)
BUN: 86 mg/dL — ABNORMAL HIGH (ref 8–23)
CO2: 24 mmol/L (ref 22–32)
Calcium: 8.9 mg/dL (ref 8.9–10.3)
Chloride: 100 mmol/L (ref 98–111)
Creatinine, Ser: 3.93 mg/dL — ABNORMAL HIGH (ref 0.61–1.24)
GFR, Estimated: 15 mL/min — ABNORMAL LOW (ref >60)
Glucose, Bld: 247 mg/dL — ABNORMAL HIGH (ref 70–99)
Potassium: 4 mmol/L (ref 3.5–5.1)
Sodium: 137 mmol/L (ref 135–145)

## 2021-10-14 LAB — BRAIN NATRIURETIC PEPTIDE: B Natriuretic Peptide: 812.7 pg/mL — ABNORMAL HIGH (ref 0.0–100.0)

## 2021-10-19 DIAGNOSIS — E1122 Type 2 diabetes mellitus with diabetic chronic kidney disease: Secondary | ICD-10-CM | POA: Diagnosis not present

## 2021-10-19 DIAGNOSIS — N390 Urinary tract infection, site not specified: Secondary | ICD-10-CM | POA: Diagnosis not present

## 2021-10-19 DIAGNOSIS — N2581 Secondary hyperparathyroidism of renal origin: Secondary | ICD-10-CM | POA: Diagnosis not present

## 2021-10-19 NOTE — Progress Notes (Signed)
? ?ADVANCED HF CLINIC PROGRESS NOTE ? ? ?Primary Care: Adam Ray MD Adam Reid) ?Primary Cardiologist: Dr. Margaretann Reid ?HF Cardiologist: Dr. Haroldine Reid  ? ?HPI: ?Adam Reid is a 76 y.o. male from [place] with a h/o a tobacco abuse (quit 2021), HTN, DM2, CKD 4 (Cr ~2.4), diastolic HF, PAF and iron-deficiency anemia, recently referred by Dr. Margaretann Reid for further evaluation of HF and restrictive CM.  ? ?Had [facility] 01/2016 for CP evaluation revealing mild nonobstructive CAD, LAD 20%, D2 50%, OM 20% ? ?Had [facility] in 2019. Not too many symptoms except for fatigue.  ? ?Hospitalized 5/80-99/83 for diastolic HF. Diuresed and weight down to 129 pounds. Sent home on lasix 60 daily. Echo LVEF 55-60%, RV ok. Restrictive filling pattern. There was also concern about cardiac amyloidosis given restrictive filling pattern on echo.  Multiple myeloma panel obtained and immunofixation did not demonstrate monoclonal protein spike. PYP completed and negative for TTR amyloid.  ? ?Zio 6/21 ?1. Sinus rhythm predominates - avg HR of 80 bpm. ?2. Two runs NSVT - the longest lasting 6 beats with an avg rate of 124 bpm. ?3. 99 Supraventricular Tachycardia/atrial tach runs occurred the longest lasting 45.5 secs with an avg rate of 105 bpm. ?4. No evidence of atrial fib ?5. Rare PVCs and PACs.  ?6. Several patient-triggered events associated with sinus rhythm  ? ?In 6/22 had capsule endoscopy which showed several small bowel AVMs. Previous EGD and colon ok.  ? ?Follow up 11/22, doing well with maintenance of NSR, volume stable. Discussed HD if renal function deteriorates. ? ?Echo (1/23): EF 55-60%, mild LVH, normal RV. ? ?S/p right AVF placement (2/23). ? ?Admitted 3/23 with a/c CHF, complicated by AKI on CKD. Incidentally found to be COVID +. Diuresed with IV lasix. Nephrology consulted but no absolute indication for HD. Discharged on home dose torsemide 20 mg, weight 119 lbs. ? ?Hospital follow up 10/05/21, weight up 5 lbs and volume overloaded.  Torsemide increased to 40 bid and Farxiga stopped with GFR 14. Arranged for home oxygen, 84% on room air at visit.  ? ?Today he returns for HF follow up with his wife. Overall feeling better. He remains weak but can get around the house without significant dyspnea. He wears oxygen at night and during the day as needed. He decreased his torsemide back to 20 mg bid this past week.  Denies palpitations, CP, dizziness, edema, or PND/Orthopnea. Appetite fair. No fever or chills. Weight at home 117 pounds. Taking all medications. Wife asking if he will be well enough for a trip to [place] in the future. ? ?ROS: All systems reviewed and negative except as per HPI.  ? ? ?Past Medical History:  ?Diagnosis Date  ? Anemia   ? CHF (congestive heart failure) ([place])   ? Chronic kidney disease   ? Depression   ? Diabetes mellitus without complication ([place])   ? Type II  ? GERD (gastroesophageal reflux disease)   ? History of blood transfusion   ? Hyperlipidemia   ? Hypertension   ? Thyroid disease   ? was on supplement, taken off by [name]  ? ? ?Current Outpatient Medications  ?Medication Sig Dispense Refill  ? amiodarone (PACERONE) 200 MG tablet Take 1 tablet (200 mg total) by mouth daily. Follow up appt required for further refills. Please call [phone] to schedule an appt 30 tablet 4  ? atorvastatin (LIPITOR) 80 MG tablet TAKE 1 TABLET(80 MG) BY MOUTH DAILY 90 tablet 1  ? blood glucose meter kit and  supplies Use up to two times daily as directed  ICD10 E10.9 E11.9 1 each 0  ? Cholecalciferol (VITAMIN D3 PO) Take 1 tablet by mouth daily.    ? citalopram (CELEXA) 20 MG tablet Take 20 mg by mouth daily.    ? diltiazem (CARDIZEM CD) 180 MG 24 hr capsule TAKE 1 CAPSULE(180 MG) BY MOUTH DAILY 60 capsule 2  ? ELIQUIS 2.5 MG TABS tablet TAKE 1 TABLET(2.5 MG) BY MOUTH TWICE DAILY 180 tablet 1  ? glipiZIDE (GLUCOTROL) 5 MG tablet Take 0.5 tablets (2.5 mg total) by mouth 2 (two) times daily before a meal. 60 tablet 3  ? GlucoCom Lancets  MISC Use for home glucose monitoring 100 each 3  ? glucose blood (ONETOUCH VERIO) test strip Test up to 2 times per day.  ?Uncontrolled diabetes with hyperglycemia and stage 4 CKD. 100 each 3  ? pantoprazole (PROTONIX) 40 MG tablet TAKE 1 TABLET(40 MG) BY MOUTH DAILY 30 tablet 3  ? polyethylene glycol (MIRALAX / GLYCOLAX) 17 g packet Take 17 g by mouth daily as needed for mild constipation or moderate constipation.     ? sitaGLIPtin (JANUVIA) 25 MG tablet TAKE 1 TABLET(25 MG) BY MOUTH DAILY 90 tablet 1  ? tamsulosin (FLOMAX) 0.4 MG CAPS capsule Take 0.4 mg by mouth daily.    ? torsemide (DEMADEX) 20 MG tablet Take 2 tablets (40 mg total) by mouth 2 (two) times daily. 120 tablet 6  ? vitamin B-12 (CYANOCOBALAMIN) 500 MCG tablet Take 500 mcg by mouth daily.    ? ?No current facility-administered medications for this encounter.  ? ?Allergies  ?Allergen Reactions  ? Pork-Derived Products   ? ?Social History  ? ?Socioeconomic History  ? Marital status: Married  ?  Spouse name: Adam Reid  ? Number of children: 1  ? Years of education: 12th grade  ? Highest education level: Not on file  ?Occupational History  ? Occupation: Retired-accountant  ?Tobacco Use  ? Smoking status: Light Smoker  ?  Packs/day: 0.10  ?  Years: 48.00  ?  Pack years: 4.80  ?  Types: Cigarettes  ?  Last attempt to quit: 07/16/2019  ?  Years since quitting: 2.2  ? Smokeless tobacco: Never  ? Tobacco comments:  ?  09/03/22 Wife states, "he hides from me, so I am not sure how much he smokes."  ?Vaping Use  ? Vaping Use: Never used  ?Substance and Sexual Activity  ? Alcohol use: No  ?  Alcohol/week: 0.0 standard drinks  ? Drug use: No  ? Sexual activity: Never  ?  Partners: Female  ?Other Topics Concern  ? Not on file  ?Social History Narrative  ? Originally from [place]. Came to the [place] in 2009, following their son who came here for school.  ? Married.  ? Lives with his wife.  ? Their adult son lives in [place], [place], where he is in optometry school.  ? Education:  [employer]  ? Exercise: No  ? ?Social Determinants of Health  ? ?Financial Resource Strain: Not on file  ?Food Insecurity: Not on file  ?Transportation Needs: Not on file  ?Physical Activity: Not on file  ?Stress: Not on file  ?Social Connections: Not on file  ?Intimate Partner Violence: Not on file  ? ?Family History  ?Problem Relation Age of Onset  ? Hypertension Mother   ? Hyperlipidemia Mother   ? Hypertension Sister   ? Kidney disease Father   ? Heart disease Brother 71  ?  open heart surgery  ? Colon cancer Neg Hx   ? ?BP 124/60   Pulse 72   Wt 54.8 kg (120 lb 12.8 oz)   SpO2 95%   BMI 19.50 kg/m?  ? ?Wt Readings from Last 3 Encounters:  ?10/21/21 54.8 kg (120 lb 12.8 oz)  ?10/20/21 56.2 kg (124 lb)  ?10/05/21 56.5 kg (124 lb 9.6 oz)  ? ?Physical Exam: ?General:  NAD. No resp difficulty, frail ?HEENT: Normal ?Neck: Supple. No JVD. Carotids 2+ bilat; no bruits. No lymphadenopathy or thryomegaly appreciated. ?Cor: PMI nondisplaced. Regular rate & rhythm. No rubs, gallops or murmurs. ?Lungs: Clear ?Abdomen: Soft, nontender, nondistended. No hepatosplenomegaly. No bruits or masses. Good bowel sounds. ?Extremities: No cyanosis, clubbing, rash, edema ?Neuro: Alert & oriented x 3, cranial nerves grossly intact. Moves all 4 extremities w/o difficulty. Affect pleasant. ? ?Assessment & Plan: ?1.Chronic Diastolic HF ?- Echo 3/81: EF 55-60% with restrictive filling pattern ?- PYP scan 4/21 negative for TTR amyloid  ?- Based on history, ECG, echo findings (normal RV) and concomitant renal failure, suspect this is due to longstanding HTN +/- DM2 changes  ?- Echo (1/23): EF 55-60%, mild LVH, normal RV. Much improved with maintenance of NSR. ?- NYHA III, limited by fatigue and physical deconditioning. Volume status better today, weight down 4 lbs. ?- Continue torsemide 20 mg bid. ?- Off Farxiga with GFR 14. ?- Labs from 10/14/21 reviewed, SCr  3.93, K 4.0. ? ?2. 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 ?- Continue amiodarone 200 mg daily. ?- Continue Eliquis 2.5 bid. No bleeding issues. ?- Hgb 10.6 on recent labs ? ?3. HTN ?- Improved today. ? ?4. CKD Stage IV  ?- due to HTN and DM2 ?

## 2021-10-20 ENCOUNTER — Ambulatory Visit (HOSPITAL_COMMUNITY)
Admission: RE | Admit: 2021-10-20 | Discharge: 2021-10-20 | Disposition: A | Discharge status: Home / Self Care | Payer: Medicare Other | Source: Ambulatory Visit | Attending: Vascular Surgery | Admitting: Vascular Surgery

## 2021-10-20 ENCOUNTER — Ambulatory Visit (INDEPENDENT_AMBULATORY_CARE_PROVIDER_SITE_OTHER): Payer: Medicaid Other | Admitting: Vascular Surgery

## 2021-10-20 ENCOUNTER — Encounter: Payer: Self-pay | Admitting: Vascular Surgery

## 2021-10-20 VITALS — BP 163/74 | HR 79 | Temp 98.6°F | Resp 20 | Ht 66.0 in | Wt 124.0 lb

## 2021-10-20 DIAGNOSIS — N184 Chronic kidney disease, stage 4 (severe): Secondary | ICD-10-CM | POA: Diagnosis not present

## 2021-10-20 NOTE — Progress Notes (Signed)
? ? ?Patient name: Adam Reid MRN: [id number] DOB: 02/20/1946 Sex: male ? ?REASON FOR CONSULT: Postop check ? ?HPI: ?Adam Reid is a 76 y.o. male, with stage IV CKD that presents for postop check after right arm brachiocephalic AV fistula on 7/56/4332.  No complaints today from the patient or his family.  Incision is healed.  Hand is doing okay. ? ?Past Medical History:  ?Diagnosis Date  ? Anemia   ? CHF (congestive heart failure) ([place])   ? Chronic kidney disease   ? Depression   ? Diabetes mellitus without complication ([place])   ? Type II  ? GERD (gastroesophageal reflux disease)   ? History of blood transfusion   ? Hyperlipidemia   ? Hypertension   ? Thyroid disease   ? was on supplement, taken off by [name]  ? ? ?Past Surgical History:  ?Procedure Laterality Date  ? APPENDECTOMY    ? AV FISTULA PLACEMENT Right 09/04/2021  ? Procedure: RIGHT BRACHIOCEPHALIC ARTERIOVENOUS (AV) FISTULA CREATION;  Surgeon: [name], MD;  Location: [facility];  Service: Vascular;  Laterality: Right;  ? BIOPSY  12/25/2020  ? Procedure: BIOPSY;  Surgeon: [name]., MD;  Location: [facility];  Service: Gastroenterology;;  ? CARDIAC CATHETERIZATION N/A 01/30/2016  ? Procedure: Left Heart Cath and Coronary Angiography;  Surgeon: [name], MD;  Location: [facility];  Service: Cardiovascular;  Laterality: N/A;  ? ENTEROSCOPY N/A 12/25/2020  ? Procedure: ENTEROSCOPY;  Surgeon: [name], MD;  Location: [facility];  Service: Gastroenterology;  Laterality: N/A;  ? GIVENS CAPSULE STUDY N/A 12/25/2020  ? Procedure: GIVENS CAPSULE STUDY;  Surgeon: [name]., MD;  Location: [facility];  Service: Gastroenterology;  Laterality: N/A;  ? SUBMUCOSAL TATTOO INJECTION  12/25/2020  ? Procedure: SUBMUCOSAL TATTOO INJECTION;  Surgeon: [name]., MD;  Location: [facility];  Service: Gastroenterology;;  ? ? ?Family History  ?Problem Relation Age of Onset  ?  Hypertension Mother   ? Hyperlipidemia Mother   ? Hypertension Sister   ? Kidney disease Father   ? Heart disease Brother 31  ?     open heart surgery  ? Colon cancer Neg Hx   ? ? ?SOCIAL HISTORY: ?Social History  ? ?Socioeconomic History  ? Marital status: Married  ?  Spouse name: Adam Reid  ? Number of children: 1  ? Years of education: 12th grade  ? Highest education level: Not on file  ?Occupational History  ? Occupation: Retired-accountant  ?Tobacco Use  ? Smoking status: Light Smoker  ?  Packs/day: 0.10  ?  Years: 48.00  ?  Pack years: 4.80  ?  Types: Cigarettes  ?  Last attempt to quit: 07/16/2019  ?  Years since quitting: 2.2  ? Smokeless tobacco: Never  ? Tobacco comments:  ?  09/03/22 Wife states, "he hides from me, so I am not sure how much he smokes."  ?Vaping Use  ? Vaping Use: Never used  ?Substance and Sexual Activity  ? Alcohol use: No  ?  Alcohol/week: 0.0 standard drinks  ? Drug use: No  ? Sexual activity: Never  ?  Partners: Female  ?Other Topics Concern  ? Not on file  ?Social History Narrative  ? Originally from [place]. Came to the [place] in 2009, following their son who came here for school.  ? Married.  ? Lives with his wife.  ? Their adult son lives in [place], [place], where he is in optometry school.  ?  Education: [employer]  ? Exercise: No  ? ?Social Determinants of Health  ? ?Financial Resource Strain: Not on file  ?Food Insecurity: Not on file  ?Transportation Needs: Not on file  ?Physical Activity: Not on file  ?Stress: Not on file  ?Social Connections: Not on file  ?Intimate Partner Violence: Not on file  ? ? ?Allergies  ?Allergen Reactions  ? Pork-Derived Products   ? ? ?Current Outpatient Medications  ?Medication Sig Dispense Refill  ? amiodarone (PACERONE) 200 MG tablet Take 1 tablet (200 mg total) by mouth daily. Follow up appt required for further refills. Please call [phone] to schedule an appt 30 tablet 4  ? atorvastatin (LIPITOR) 80 MG tablet TAKE 1 TABLET(80 MG) BY MOUTH DAILY 90 tablet  1  ? blood glucose meter kit and supplies Use up to two times daily as directed  ICD10 E10.9 E11.9 1 each 0  ? Cholecalciferol (VITAMIN D3 PO) Take 1 tablet by mouth daily.    ? citalopram (CELEXA) 20 MG tablet Take 20 mg by mouth daily.    ? diltiazem (CARDIZEM CD) 180 MG 24 hr capsule TAKE 1 CAPSULE(180 MG) BY MOUTH DAILY 60 capsule 2  ? ELIQUIS 2.5 MG TABS tablet TAKE 1 TABLET(2.5 MG) BY MOUTH TWICE DAILY 180 tablet 1  ? glipiZIDE (GLUCOTROL) 5 MG tablet Take 0.5 tablets (2.5 mg total) by mouth 2 (two) times daily before a meal. 60 tablet 3  ? GlucoCom Lancets MISC Use for home glucose monitoring 100 each 3  ? glucose blood (ONETOUCH VERIO) test strip Test up to 2 times per day.  ?Uncontrolled diabetes with hyperglycemia and stage 4 CKD. 100 each 3  ? pantoprazole (PROTONIX) 40 MG tablet TAKE 1 TABLET(40 MG) BY MOUTH DAILY 30 tablet 3  ? polyethylene glycol (MIRALAX / GLYCOLAX) 17 g packet Take 17 g by mouth daily as needed for mild constipation or moderate constipation.     ? sitaGLIPtin (JANUVIA) 25 MG tablet TAKE 1 TABLET(25 MG) BY MOUTH DAILY 90 tablet 1  ? tamsulosin (FLOMAX) 0.4 MG CAPS capsule Take 0.4 mg by mouth daily.    ? torsemide (DEMADEX) 20 MG tablet Take 2 tablets (40 mg total) by mouth 2 (two) times daily. 120 tablet 6  ? vitamin B-12 (CYANOCOBALAMIN) 500 MCG tablet Take 500 mcg by mouth daily.    ? ?No current facility-administered medications for this visit.  ? ? ?REVIEW OF SYSTEMS:  ?_0  denotes positive finding, _1  denotes negative finding ?Cardiac  Comments:  ?Chest pain or chest pressure:    ?Shortness of breath upon exertion:    ?Short of breath when lying flat:    ?Irregular heart rhythm:    ?    ?Vascular    ?Pain in calf, thigh, or hip brought on by ambulation:    ?Pain in feet at night that wakes you up from your sleep:     ?Blood clot in your veins:    ?Leg swelling:     ?    ?Pulmonary    ?Oxygen at home:    ?Productive cough:     ?Wheezing:     ?    ?Neurologic    ?Sudden weakness  in arms or legs:     ?Sudden numbness in arms or legs:     ?Sudden onset of difficulty speaking or slurred speech:    ?Temporary loss of vision in one eye:     ?Problems with dizziness:     ?    ?Gastrointestinal    ?  Blood in stool:     ?Vomited blood:     ?    ?Genitourinary    ?Burning when urinating:     ?Blood in urine:    ?    ?Psychiatric    ?Major depression:     ?    ?Hematologic    ?Bleeding problems:    ?Problems with blood clotting too easily:    ?    ?Skin    ?Rashes or ulcers:    ?    ?Constitutional    ?Fever or chills:    ? ? ?PHYSICAL EXAM: ?Vitals:  ? 10/20/21 1620  ?BP: (!) 163/74  ?Pulse: 79  ?Resp: 20  ?Temp: 98.6 ?F (37 ?C)  ?SpO2: 93%  ?Weight: 124 lb (56.2 kg)  ?Height: _0  (1.676 m)  ? ? ?GENERAL: The patient is a well-nourished male, in no acute distress. The vital signs are documented above. ?CARDIAC: There is a regular rate and rhythm.  ?VASCULAR:  ?Right arm AV fistula with good thrill ?Right arm incision well-healed ? ?DATA:  ? ?Right arm fistula duplex shows a stenosis in the cephalic vein in the mid upper arm with the vein measuring about 17 mm ? ?Assessment/Plan: ? ?76 year old male status post right brachiocephalic AV fistula on 9/62/2297 for stage IV CKD.  On exam the incision is healed and the fistula has a good thrill.  There appears to be a stenosis in the mid upper arm cephalic vein as noted on duplex today.  I discussed with the patient's family that we would normally recommend fistulogram with likely balloon angioplasty to assist maturation.  However, given he has stage IV CKD and has not started dialysis at this time, I think the risk of contrast likely exceeds the benefits and discussed having his nephrologist Dr. [name] let me know if he feels he is close to needing dialysis and we can intervene at that time.  I will send Dr. [name] a note as well. ? ? ?[name], MD ?[facility] ?Office: [phone] ? ? ? ? ?

## 2021-10-21 ENCOUNTER — Encounter (HOSPITAL_COMMUNITY): Payer: Self-pay

## 2021-10-21 ENCOUNTER — Ambulatory Visit (HOSPITAL_COMMUNITY)
Admission: RE | Admit: 2021-10-21 | Discharge: 2021-10-21 | Disposition: A | Discharge status: Home / Self Care | Payer: Medicare Other | Source: Ambulatory Visit | Attending: Internal Medicine | Admitting: Internal Medicine

## 2021-10-21 VITALS — BP 124/60 | HR 72 | Wt 120.8 lb

## 2021-10-21 DIAGNOSIS — I130 Hypertensive heart and chronic kidney disease with heart failure and stage 1 through stage 4 chronic kidney disease, or unspecified chronic kidney disease: Secondary | ICD-10-CM | POA: Diagnosis not present

## 2021-10-21 DIAGNOSIS — I5032 Chronic diastolic (congestive) heart failure: Secondary | ICD-10-CM | POA: Insufficient documentation

## 2021-10-21 DIAGNOSIS — I484 Atypical atrial flutter: Secondary | ICD-10-CM | POA: Insufficient documentation

## 2021-10-21 DIAGNOSIS — D509 Iron deficiency anemia, unspecified: Secondary | ICD-10-CM | POA: Insufficient documentation

## 2021-10-21 DIAGNOSIS — E1122 Type 2 diabetes mellitus with diabetic chronic kidney disease: Secondary | ICD-10-CM | POA: Diagnosis not present

## 2021-10-21 DIAGNOSIS — R0902 Hypoxemia: Secondary | ICD-10-CM

## 2021-10-21 DIAGNOSIS — I480 Paroxysmal atrial fibrillation: Secondary | ICD-10-CM | POA: Diagnosis not present

## 2021-10-21 DIAGNOSIS — Z7984 Long term (current) use of oral hypoglycemic drugs: Secondary | ICD-10-CM | POA: Diagnosis not present

## 2021-10-21 DIAGNOSIS — N184 Chronic kidney disease, stage 4 (severe): Secondary | ICD-10-CM | POA: Diagnosis not present

## 2021-10-21 DIAGNOSIS — Z8616 Personal history of COVID-19: Secondary | ICD-10-CM | POA: Diagnosis not present

## 2021-10-21 DIAGNOSIS — Z79899 Other long term (current) drug therapy: Secondary | ICD-10-CM | POA: Diagnosis not present

## 2021-10-21 DIAGNOSIS — Z7901 Long term (current) use of anticoagulants: Secondary | ICD-10-CM | POA: Diagnosis not present

## 2021-10-21 DIAGNOSIS — Z87891 Personal history of nicotine dependence: Secondary | ICD-10-CM | POA: Diagnosis not present

## 2021-10-21 DIAGNOSIS — E119 Type 2 diabetes mellitus without complications: Secondary | ICD-10-CM | POA: Diagnosis not present

## 2021-10-21 DIAGNOSIS — I2510 Atherosclerotic heart disease of native coronary artery without angina pectoris: Secondary | ICD-10-CM | POA: Diagnosis not present

## 2021-10-21 DIAGNOSIS — I10 Essential (primary) hypertension: Secondary | ICD-10-CM | POA: Diagnosis not present

## 2021-10-21 DIAGNOSIS — I4892 Unspecified atrial flutter: Secondary | ICD-10-CM

## 2021-10-21 NOTE — Patient Instructions (Signed)
Thank you for coming in today ? ?NO Labs ?No medication changes  ? ?Your physician recommends that you schedule a follow-up appointment in:  ?Keep follow appointment with Dr. Haroldine Laws ? ?At the [facility], you and your health needs are our priority. As part of our continuing mission to provide you with exceptional heart care, we have created designated Provider Care Teams. These Care Teams include your primary Cardiologist (physician) and Advanced Practice Providers (APPs- Physician Assistants and Nurse Practitioners) who all work together to provide you with the care you need, when you need it.  ? ?You may see any of the following providers on your designated Care Team at your next follow up: ?Dr Glori Bickers ?Dr Loralie Champagne ?Darrick Grinder, NP ?Lyda Jester, PA ?Jessica Milford,NP ?Marlyce Huge, PA ?Audry Riles, PharmD ? ? ?Please be sure to bring in all your medications bottles to every appointment.  ? ?If you have any questions or concerns before your next appointment please send [place] a message through [place] or call our office at [phone].   ? ?TO LEAVE A MESSAGE FOR THE NURSE SELECT OPTION 2, PLEASE LEAVE A MESSAGE INCLUDING: ?YOUR NAME ?DATE OF BIRTH ?CALL BACK NUMBER ?REASON FOR CALL**this is important as we prioritize the call backs ? ?YOU WILL RECEIVE A CALL BACK THE SAME DAY AS LONG AS YOU CALL BEFORE 4:00 PM ? ?

## 2021-10-23 ENCOUNTER — Encounter (HOSPITAL_COMMUNITY)
Admission: RE | Admit: 2021-10-23 | Discharge: 2021-10-23 | Disposition: A | Discharge status: Home / Self Care | Payer: Medicare Other | Source: Ambulatory Visit | Attending: Nephrology | Admitting: Nephrology

## 2021-10-23 DIAGNOSIS — N179 Acute kidney failure, unspecified: Secondary | ICD-10-CM | POA: Diagnosis not present

## 2021-10-23 MED ORDER — SODIUM CHLORIDE 0.9 % IV SOLN
510.0000 mg | INTRAVENOUS | Status: DC
  Administered 2021-10-23: 510 mg via INTRAVENOUS
  Filled 2021-10-23: qty 510

## 2021-10-26 DIAGNOSIS — E1122 Type 2 diabetes mellitus with diabetic chronic kidney disease: Secondary | ICD-10-CM | POA: Diagnosis not present

## 2021-10-26 DIAGNOSIS — N2581 Secondary hyperparathyroidism of renal origin: Secondary | ICD-10-CM | POA: Diagnosis not present

## 2021-10-26 DIAGNOSIS — I129 Hypertensive chronic kidney disease with stage 1 through stage 4 chronic kidney disease, or unspecified chronic kidney disease: Secondary | ICD-10-CM | POA: Diagnosis not present

## 2021-10-26 DIAGNOSIS — N184 Chronic kidney disease, stage 4 (severe): Secondary | ICD-10-CM | POA: Diagnosis not present

## 2021-10-26 DIAGNOSIS — D631 Anemia in chronic kidney disease: Secondary | ICD-10-CM | POA: Diagnosis not present

## 2021-10-30 ENCOUNTER — Other Ambulatory Visit (HOSPITAL_COMMUNITY): Payer: Self-pay | Admitting: Internal Medicine

## 2021-11-02 ENCOUNTER — Ambulatory Visit (INDEPENDENT_AMBULATORY_CARE_PROVIDER_SITE_OTHER): Payer: Medicare Other | Admitting: Family Medicine

## 2021-11-02 ENCOUNTER — Encounter: Payer: Self-pay | Admitting: Family Medicine

## 2021-11-02 VITALS — BP 136/68 | HR 82 | Temp 98.4°F | Resp 16 | Ht 66.0 in | Wt 119.8 lb

## 2021-11-02 DIAGNOSIS — I5033 Acute on chronic diastolic (congestive) heart failure: Secondary | ICD-10-CM | POA: Diagnosis not present

## 2021-11-02 DIAGNOSIS — N184 Chronic kidney disease, stage 4 (severe): Secondary | ICD-10-CM

## 2021-11-02 DIAGNOSIS — R400 Somnolence: Secondary | ICD-10-CM | POA: Diagnosis not present

## 2021-11-02 DIAGNOSIS — F339 Major depressive disorder, recurrent, unspecified: Secondary | ICD-10-CM

## 2021-11-02 DIAGNOSIS — E1122 Type 2 diabetes mellitus with diabetic chronic kidney disease: Secondary | ICD-10-CM

## 2021-11-02 LAB — GLUCOSE, POCT (MANUAL RESULT ENTRY): POC Glucose: 275 mg/dl — AB (ref 70–99)

## 2021-11-02 NOTE — Progress Notes (Signed)
? ?Subjective:  ?Patient ID: Adam Reid, male    DOB: 04-18-1946  Age: 76 y.o. MRN: [id number] ? ?CC:  ?Chief Complaint  ?Patient presents with  ? Chronic Kidney Disease  ?  Pt due for follow up   ? Congestive Heart Failure  ?  Due for recheck   ? Diabetes  ?  Pt is due for recheck notes over last 6 months he has lost 20lb and thinks this is due to Diabetes   ? Depression  ?  Pt wife reports no changes PHQ9=13   ? ? ?HPI ?Adam Reid presents for  ? ?Follow-up from March 16, hospital follow-up at that time. ?here with interpreter and spouse  ? ?CHF ?Acute on chronic diastolic CHF prior.   ?CHF clinic follow up 3/27. Option to increase torsemide if increased swelling or shortness of breath. Repeat visit 10/21/21 -NYHA III. Weight improved 4# at that visit - stable today.  ?Shortness of breath feels stable.  ?On torsemide 62m BID.  ?Wt Readings from Last 3 Encounters:  ?11/02/21 119 lb 12.8 oz (54.3 kg)  ?10/21/21 120 lb 12.8 oz (54.8 kg)  ?10/20/21 124 lb (56.2 kg)  ? ? ?Type 2 diabetes with chronic kidney disease ?Decreased control last visit, we added back glipizide 2.5 mg twice daily.  Continued on Farxiga, Januvia same doses, monitor for hypoglycemia as he is experienced in the past. ?Lab Results  ?Component Value Date  ? HGBA1C 7.9 (H) 08/27/2021  ?Weight loss past 6 months as above.  ?AV fistula placed 09/04/2021.  Plan for possible balloon angioplasty for stenosis in mid upper arm cephalic vein once he is closer to dialysis.  Dr. PPosey Prontois nephrologist. ?Still holding on decision to start dialysis. Recent GFR 18 per spouse few weeks ago though nephrology.  ?Home readings around 160.  ?Lowest 130, highest 180 fasting.  ?Postprandial up to 240.  ?Taking glipizide 5321mBID now.  FaWilder Gladeas stopped 3/27 at CHF clinic d/t GFR 14 increased to 21m62mose of glipizide.  ? ?Depression: ?Taking citalopram 55m14m. Avoided increased doses of celexa prior d/t QT prolongation.  ?No SI.  ?Still some fatigue, decreased  motivation.  ?Eating some.not much.  ?Sleeping during the day at times. Naps during the day, trouble sleeping at night.  ?Denies depression.  ? ? ?  11/02/2021  ? 11:48 AM 09/24/2021  ?  2:47 PM 08/27/2021  ?  1:57 PM 03/02/2021  ?  1:48 PM 12/22/2020  ?  1:57 PM  ?Depression screen PHQ 2/9  ?Decreased Interest _0 0  ?Down, Depressed, Hopeless 0 0 2 1 0  ?PHQ - 2 Score _1 0  ?Altered sleeping _2 ?Tired, decreased energy _3 0  ?Change in appetite _4 0  ?Feeling bad or failure about yourself   0 2 1 0  ?Trouble concentrating 0 0 1 1 0  ?Moving slowly or fidgety/restless _5 0  ?Suicidal thoughts 0 0  3 0  ?PHQ-9 Score _6 ?Difficult doing work/chores    Somewhat difficult   ? ? ? ? ?History ?Patient Active Problem List  ? Diagnosis Date Noted  ? Iron deficiency anemia 09/16/2021  ? Acute on chronic diastolic CHF (congestive heart failure) (HCC)[place]/12/2021  ? COVID-19 virus infection 09/14/2021  ? Acute kidney injury superimposed on chronic kidney disease (HCC)[place]/12/2021  ? Chronic kidney disease (CKD), stage  IV (severe) ([place]) 06/09/2021  ? Symptomatic anemia 12/23/2020  ? GI bleed 12/23/2020  ? Paroxysmal A-fib ([place]) 12/23/2020  ? Chronic diastolic CHF (congestive heart failure) ([place]) 12/23/2020  ? Acute CHF (congestive heart failure) ([place]) 09/24/2019  ? ARF (acute renal failure) ([place]) 09/24/2019  ? Anemia of chronic disease 09/24/2019  ? Acute respiratory failure with hypoxia ([place])   ? Secondary hyperparathyroidism, renal ([place]) 12/29/2016  ? Smoker 03/26/2016  ? Hypotension 01/29/2016  ? Abnormal myocardial perfusion study 01/29/2016  ? Depression 12/09/2015  ? Type 2 diabetes mellitus with hyperlipidemia ([place])   ? HTN (hypertension) 06/24/2014  ? 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 BPH (benign prostatic hyperplasia) 06/24/2014  ? ?Past Medical History:  ?Diagnosis Date  ? Anemia   ? CHF (congestive heart failure) ([place])   ?  Chronic kidney disease   ? Depression   ? Diabetes mellitus without complication ([place])   ? Type II  ? GERD (gastroesophageal reflux disease)   ? History of blood transfusion   ? Hyperlipidemia   ? Hypertension   ? Thyroid disease   ? was on supplement, taken off by [name]  ? ?Past Surgical History:  ?Procedure Laterality Date  ? APPENDECTOMY    ? AV FISTULA PLACEMENT Right 09/04/2021  ? Procedure: RIGHT BRACHIOCEPHALIC ARTERIOVENOUS (AV) FISTULA CREATION;  Surgeon: [name], MD;  Location: [facility];  Service: Vascular;  Laterality: Right;  ? BIOPSY  12/25/2020  ? Procedure: BIOPSY;  Surgeon: [name]., MD;  Location: [facility];  Service: Gastroenterology;;  ? CARDIAC CATHETERIZATION N/A 01/30/2016  ? Procedure: Left Heart Cath and Coronary Angiography;  Surgeon: [name], MD;  Location: [facility];  Service: Cardiovascular;  Laterality: N/A;  ? ENTEROSCOPY N/A 12/25/2020  ? Procedure: ENTEROSCOPY;  Surgeon: [name], MD;  Location: [facility];  Service: Gastroenterology;  Laterality: N/A;  ? GIVENS CAPSULE STUDY N/A 12/25/2020  ? Procedure: GIVENS CAPSULE STUDY;  Surgeon: [name]., MD;  Location: [facility];  Service: Gastroenterology;  Laterality: N/A;  ? SUBMUCOSAL TATTOO INJECTION  12/25/2020  ? Procedure: SUBMUCOSAL TATTOO INJECTION;  Surgeon: [name]., MD;  Location: [facility];  Service: Gastroenterology;;  ? ?Allergies  ?Allergen Reactions  ? Pork-Derived Products   ? ?Prior to Admission medications   ?Medication Sig Start Date End Date Taking? Authorizing Provider  ?amiodarone (PACERONE) 200 MG tablet Take 1 tablet (200 mg total) by mouth daily. Follow up appt required for further refills. Please call [phone] to schedule an appt 01/28/21  Yes Bensimhon, Shaune Pascal, MD  ?atorvastatin (LIPITOR) 80 MG tablet TAKE 1 TABLET(80 MG) BY MOUTH DAILY 12/22/20  Yes [name], MD  ?blood glucose meter kit and supplies  Use up to two times daily as directed  ICD10 E10.9 E11.9 02/20/19  Yes [name], MD  ?Cholecalciferol (VITAMIN D3 PO) Take 1 tablet by mouth daily.   Yes [provider]  ?citalopram (CELEXA) 20 MG tablet Take 20 mg by mouth daily.   Yes [provider]  ?diltiazem (CARDIZEM CD) 180 MG 24 hr capsule TAKE 1 CAPSULE(180 MG) BY MOUTH DAILY 08/24/21  Yes Bensimhon, Shaune Pascal, MD  ?ELIQUIS 2.5 MG TABS tablet TAKE 1 TABLET(2.5 MG) BY MOUTH TWICE DAILY 10/30/21  Yes Bensimhon, Shaune Pascal, MD  ?glipiZIDE (GLUCOTROL) 5 MG tablet Take 0.5 tablets (2.5 mg total) by mouth 2 (two) times daily before a meal. ?Patient  taking differently: Take 2.5 mg by mouth 2 (two) times daily before a meal. 2 15m daily 09/24/21  Yes GWendie Agreste MD  ?GlucoCom Lancets MISC Use for home glucose monitoring 09/02/15  Yes JHarrison Mons PA  ?glucose blood (ONETOUCH VERIO) test strip Test up to 2 times per day.  ?Uncontrolled diabetes with hyperglycemia and stage 4 CKD. 04/13/19  Yes GWendie Agreste MD  ?pantoprazole (PROTONIX) 40 MG tablet TAKE 1 TABLET(40 MG) BY MOUTH DAILY 04/27/21  Yes LLevin Erp PA  ?polyethylene glycol (MIRALAX / GLYCOLAX) 17 g packet Take 17 g by mouth daily as needed for mild constipation or moderate constipation.    Yes [provider]  ?sitaGLIPtin (JANUVIA) 25 MG tablet TAKE 1 TABLET(25 MG) BY MOUTH DAILY 08/31/21  Yes GWendie Agreste MD  ?tamsulosin (FLOMAX) 0.4 MG CAPS capsule Take 0.4 mg by mouth daily.   Yes [provider]  ?torsemide (DEMADEX) 20 MG tablet Take 2 tablets (40 mg total) by mouth 2 (two) times daily. 10/05/21  Yes [place], JMaricela Bo FNP  ?vitamin B-12 (CYANOCOBALAMIN) 500 MCG tablet Take 500 mcg by mouth daily.   Yes [provider]  ? ?Social History  ? ?Socioeconomic History  ? Marital status: Married  ?  Spouse name: [name]  ? Number of children: 1  ? Years of education: 12th grade  ? Highest education level: Not on file   ?Occupational History  ? Occupation: Retired-accountant  ?Tobacco Use  ? Smoking status: Light Smoker  ?  Packs/day: 0.10  ?  Years: 48.00  ?  Pack years: 4.80  ?  Types: Cigarettes  ?  Last attempt to quit: 1/4

## 2021-11-02 NOTE — Patient Instructions (Addendum)
Increase glipizide to 10 mg for morning dose (take 2 of the '5mg'$  pill)- take with food. Stay at '5mg'$  in evening. Watch for low blood sugars. If those occur let me know and return to lower dose glipizide.  ?Follow up in 3 weeks for diabetes.  ? ?Try to avoid naps during the day to help with sleep at night. Fresh air, short walk may help.  ?No change in other meds for now.  ? ?Return to the clinic or go to the nearest emergency room if any of your symptoms worsen or new symptoms occur. ? ? ?Take care! ? ? ? ? ?

## 2021-11-05 DIAGNOSIS — I5032 Chronic diastolic (congestive) heart failure: Secondary | ICD-10-CM | POA: Diagnosis not present

## 2021-11-06 ENCOUNTER — Encounter (HOSPITAL_COMMUNITY)
Admission: RE | Admit: 2021-11-06 | Discharge: 2021-11-06 | Disposition: A | Discharge status: Home / Self Care | Payer: Medicare Other | Source: Ambulatory Visit | Attending: Nephrology | Admitting: Nephrology

## 2021-11-06 VITALS — BP 125/61 | HR 76 | Temp 98.0°F | Resp 18 | Wt 119.8 lb

## 2021-11-06 DIAGNOSIS — N179 Acute kidney failure, unspecified: Secondary | ICD-10-CM

## 2021-11-06 LAB — POCT HEMOGLOBIN-HEMACUE: Hemoglobin: 8.8 g/dL — ABNORMAL LOW (ref 13.0–17.0)

## 2021-11-06 MED ORDER — EPOETIN ALFA-EPBX 10000 UNIT/ML IJ SOLN
10000.0000 [IU] | INTRAMUSCULAR | Status: DC
  Administered 2021-11-06: 10000 [IU] via SUBCUTANEOUS

## 2021-11-06 MED ORDER — EPOETIN ALFA-EPBX 10000 UNIT/ML IJ SOLN
INTRAMUSCULAR | Status: AC
  Filled 2021-11-06: qty 1

## 2021-11-06 MED ORDER — SODIUM CHLORIDE 0.9 % IV SOLN
510.0000 mg | INTRAVENOUS | Status: DC
  Administered 2021-11-06: 510 mg via INTRAVENOUS
  Filled 2021-11-06: qty 510

## 2021-11-09 ENCOUNTER — Emergency Department (HOSPITAL_COMMUNITY): Payer: Medicare Other

## 2021-11-09 ENCOUNTER — Emergency Department (HOSPITAL_COMMUNITY)
Admission: EM | Admit: 2021-11-09 | Discharge: 2021-11-10 | Discharge status: Against Medical Advice | Payer: Medicare Other | Attending: Emergency Medicine | Admitting: Emergency Medicine

## 2021-11-09 ENCOUNTER — Other Ambulatory Visit: Payer: Self-pay

## 2021-11-09 DIAGNOSIS — R110 Nausea: Secondary | ICD-10-CM | POA: Diagnosis not present

## 2021-11-09 DIAGNOSIS — R0602 Shortness of breath: Secondary | ICD-10-CM | POA: Insufficient documentation

## 2021-11-09 DIAGNOSIS — Z5321 Procedure and treatment not carried out due to patient leaving prior to being seen by health care provider: Secondary | ICD-10-CM | POA: Insufficient documentation

## 2021-11-09 DIAGNOSIS — R911 Solitary pulmonary nodule: Secondary | ICD-10-CM | POA: Diagnosis not present

## 2021-11-09 DIAGNOSIS — J90 Pleural effusion, not elsewhere classified: Secondary | ICD-10-CM | POA: Diagnosis not present

## 2021-11-09 DIAGNOSIS — R531 Weakness: Secondary | ICD-10-CM | POA: Diagnosis not present

## 2021-11-09 DIAGNOSIS — I509 Heart failure, unspecified: Secondary | ICD-10-CM | POA: Diagnosis not present

## 2021-11-09 DIAGNOSIS — R079 Chest pain, unspecified: Secondary | ICD-10-CM | POA: Diagnosis not present

## 2021-11-09 LAB — CBC
HCT: 28.7 % — ABNORMAL LOW (ref 39.0–52.0)
Hemoglobin: 9 g/dL — ABNORMAL LOW (ref 13.0–17.0)
MCH: 27 pg (ref 26.0–34.0)
MCHC: 31.4 g/dL (ref 30.0–36.0)
MCV: 86.2 fL (ref 80.0–100.0)
Platelets: 358 10*3/uL (ref 150–400)
RBC: 3.33 MIL/uL — ABNORMAL LOW (ref 4.22–5.81)
RDW: 18 % — ABNORMAL HIGH (ref 11.5–15.5)
WBC: 13.1 10*3/uL — ABNORMAL HIGH (ref 4.0–10.5)
nRBC: 0 % (ref 0.0–0.2)

## 2021-11-09 LAB — BASIC METABOLIC PANEL
Anion gap: 13 (ref 5–15)
BUN: 85 mg/dL — ABNORMAL HIGH (ref 8–23)
CO2: 19 mmol/L — ABNORMAL LOW (ref 22–32)
Calcium: 8.9 mg/dL (ref 8.9–10.3)
Chloride: 104 mmol/L (ref 98–111)
Creatinine, Ser: 3.36 mg/dL — ABNORMAL HIGH (ref 0.61–1.24)
GFR, Estimated: 18 mL/min — ABNORMAL LOW (ref >60)
Glucose, Bld: 179 mg/dL — ABNORMAL HIGH (ref 70–99)
Potassium: 4.1 mmol/L (ref 3.5–5.1)
Sodium: 136 mmol/L (ref 135–145)

## 2021-11-09 NOTE — ED Triage Notes (Signed)
Pt presents with shortness of breath increasing with weakness.  Wife reports pt wears 2L [place] at home almost all of the time. States pt was so weak she had to hold him up today.  O2 on RA 86%. Pt placed on 2L [place] at time of triage.  Also reports nausea but no vomiting.  Hx of CHF ?

## 2021-11-09 NOTE — ED Notes (Signed)
Oxygen tank changed 

## 2021-11-09 NOTE — ED Provider Triage Note (Addendum)
Emergency Medicine Provider Triage Evaluation Note ? ?Adam Reid , a 76 y.o. male  was evaluated in triage.  Pt complains of shortness of breath and weakness for the last 1 month.  Patient presents with wife who provides some history.  The patient's wife states that for the last 1 month the patient has had increased weakness, inability to ambulate on his own.  Patient wife states that patient is on oxygen at home, 2 L, and he has not been complaining of shortness of breath necessarily.  Patient wife states that the patient is always short of breath.  Patient wife states that his kidney function is very poor, he is going to need dialysis soon however he does not need a right now per his PCP. ? ?Review of Systems  ?Positive:  ?Negative:  ? ?Physical Exam  ?BP (!) 160/76   Pulse 88   Temp 98 ?F (36.7 ?C) (Oral)   Resp 20   SpO2 (!) 88%  ?Gen:   Awake, no distress   ?Resp:  Normal effort  ?MSK:   Moves extremities without difficulty  ?Other:  Decreased lung sounds.  No lower extremity swelling.  Patient neurologically intact.  No deficits noted. ? ?Medical Decision Making  ?Medically screening exam initiated at 8:00 PM.  Appropriate orders placed.  Olaf Mesa was informed that the remainder of the evaluation will be completed by another provider, this initial triage assessment does not replace that evaluation, and the importance of remaining in the ED until their evaluation is complete. ? ? ?  ? ? ?  ?Azucena Cecil, PA-C ?11/09/21 2002 ? ?

## 2021-11-10 NOTE — ED Notes (Signed)
Patient states d/t wait time he is leaving ?

## 2021-11-12 ENCOUNTER — Encounter (HOSPITAL_BASED_OUTPATIENT_CLINIC_OR_DEPARTMENT_OTHER): Payer: Self-pay

## 2021-11-12 ENCOUNTER — Emergency Department (HOSPITAL_BASED_OUTPATIENT_CLINIC_OR_DEPARTMENT_OTHER): Payer: Medicare Other

## 2021-11-12 ENCOUNTER — Other Ambulatory Visit: Payer: Self-pay

## 2021-11-12 ENCOUNTER — Other Ambulatory Visit (HOSPITAL_COMMUNITY): Payer: Self-pay | Admitting: Internal Medicine

## 2021-11-12 ENCOUNTER — Inpatient Hospital Stay (HOSPITAL_BASED_OUTPATIENT_CLINIC_OR_DEPARTMENT_OTHER)
Admission: EM | Admit: 2021-11-12 | Discharge: 2021-11-22 | DRG: 291 | Disposition: A | Discharge status: Home / Self Care | Payer: Medicare Other | Attending: Internal Medicine | Admitting: Internal Medicine

## 2021-11-12 DIAGNOSIS — J9611 Chronic respiratory failure with hypoxia: Secondary | ICD-10-CM | POA: Diagnosis not present

## 2021-11-12 DIAGNOSIS — Z7984 Long term (current) use of oral hypoglycemic drugs: Secondary | ICD-10-CM | POA: Diagnosis not present

## 2021-11-12 DIAGNOSIS — D631 Anemia in chronic kidney disease: Secondary | ICD-10-CM | POA: Diagnosis not present

## 2021-11-12 DIAGNOSIS — Z681 Body mass index (BMI) 19 or less, adult: Secondary | ICD-10-CM | POA: Diagnosis not present

## 2021-11-12 DIAGNOSIS — K219 Gastro-esophageal reflux disease without esophagitis: Secondary | ICD-10-CM | POA: Diagnosis present

## 2021-11-12 DIAGNOSIS — Z9981 Dependence on supplemental oxygen: Secondary | ICD-10-CM

## 2021-11-12 DIAGNOSIS — R54 Age-related physical debility: Secondary | ICD-10-CM | POA: Diagnosis not present

## 2021-11-12 DIAGNOSIS — Z8349 Family history of other endocrine, nutritional and metabolic diseases: Secondary | ICD-10-CM

## 2021-11-12 DIAGNOSIS — I340 Nonrheumatic mitral (valve) insufficiency: Secondary | ICD-10-CM | POA: Diagnosis present

## 2021-11-12 DIAGNOSIS — I130 Hypertensive heart and chronic kidney disease with heart failure and stage 1 through stage 4 chronic kidney disease, or unspecified chronic kidney disease: Principal | ICD-10-CM | POA: Diagnosis present

## 2021-11-12 DIAGNOSIS — J440 Chronic obstructive pulmonary disease with acute lower respiratory infection: Secondary | ICD-10-CM | POA: Diagnosis not present

## 2021-11-12 DIAGNOSIS — I509 Heart failure, unspecified: Secondary | ICD-10-CM

## 2021-11-12 DIAGNOSIS — I5033 Acute on chronic diastolic (congestive) heart failure: Secondary | ICD-10-CM | POA: Diagnosis not present

## 2021-11-12 DIAGNOSIS — E1122 Type 2 diabetes mellitus with diabetic chronic kidney disease: Secondary | ICD-10-CM | POA: Diagnosis present

## 2021-11-12 DIAGNOSIS — E876 Hypokalemia: Secondary | ICD-10-CM | POA: Diagnosis not present

## 2021-11-12 DIAGNOSIS — K2270 Barrett's esophagus without dysplasia: Secondary | ICD-10-CM | POA: Diagnosis present

## 2021-11-12 DIAGNOSIS — R0602 Shortness of breath: Secondary | ICD-10-CM | POA: Diagnosis not present

## 2021-11-12 DIAGNOSIS — E119 Type 2 diabetes mellitus without complications: Secondary | ICD-10-CM | POA: Diagnosis not present

## 2021-11-12 DIAGNOSIS — Z7901 Long term (current) use of anticoagulants: Secondary | ICD-10-CM

## 2021-11-12 DIAGNOSIS — E079 Disorder of thyroid, unspecified: Secondary | ICD-10-CM

## 2021-11-12 DIAGNOSIS — Z841 Family history of disorders of kidney and ureter: Secondary | ICD-10-CM

## 2021-11-12 DIAGNOSIS — Z79899 Other long term (current) drug therapy: Secondary | ICD-10-CM | POA: Diagnosis not present

## 2021-11-12 DIAGNOSIS — E43 Unspecified severe protein-calorie malnutrition: Secondary | ICD-10-CM | POA: Diagnosis present

## 2021-11-12 DIAGNOSIS — I2510 Atherosclerotic heart disease of native coronary artery without angina pectoris: Secondary | ICD-10-CM | POA: Diagnosis present

## 2021-11-12 DIAGNOSIS — F17210 Nicotine dependence, cigarettes, uncomplicated: Secondary | ICD-10-CM | POA: Diagnosis present

## 2021-11-12 DIAGNOSIS — Z8249 Family history of ischemic heart disease and other diseases of the circulatory system: Secondary | ICD-10-CM

## 2021-11-12 DIAGNOSIS — I10 Essential (primary) hypertension: Secondary | ICD-10-CM | POA: Diagnosis present

## 2021-11-12 DIAGNOSIS — J189 Pneumonia, unspecified organism: Secondary | ICD-10-CM | POA: Diagnosis not present

## 2021-11-12 DIAGNOSIS — R1909 Other intra-abdominal and pelvic swelling, mass and lump: Secondary | ICD-10-CM | POA: Diagnosis present

## 2021-11-12 DIAGNOSIS — M898X9 Other specified disorders of bone, unspecified site: Secondary | ICD-10-CM | POA: Diagnosis present

## 2021-11-12 DIAGNOSIS — E872 Acidosis, unspecified: Secondary | ICD-10-CM | POA: Diagnosis not present

## 2021-11-12 DIAGNOSIS — E782 Mixed hyperlipidemia: Secondary | ICD-10-CM | POA: Diagnosis not present

## 2021-11-12 DIAGNOSIS — E1165 Type 2 diabetes mellitus with hyperglycemia: Secondary | ICD-10-CM | POA: Diagnosis not present

## 2021-11-12 DIAGNOSIS — R1902 Left upper quadrant abdominal swelling, mass and lump: Secondary | ICD-10-CM | POA: Diagnosis not present

## 2021-11-12 DIAGNOSIS — E039 Hypothyroidism, unspecified: Secondary | ICD-10-CM | POA: Diagnosis present

## 2021-11-12 DIAGNOSIS — I110 Hypertensive heart disease with heart failure: Secondary | ICD-10-CM | POA: Diagnosis not present

## 2021-11-12 DIAGNOSIS — N189 Chronic kidney disease, unspecified: Secondary | ICD-10-CM | POA: Diagnosis present

## 2021-11-12 DIAGNOSIS — I480 Paroxysmal atrial fibrillation: Secondary | ICD-10-CM | POA: Diagnosis present

## 2021-11-12 DIAGNOSIS — R531 Weakness: Secondary | ICD-10-CM | POA: Diagnosis not present

## 2021-11-12 DIAGNOSIS — R1900 Intra-abdominal and pelvic swelling, mass and lump, unspecified site: Secondary | ICD-10-CM

## 2021-11-12 DIAGNOSIS — N184 Chronic kidney disease, stage 4 (severe): Secondary | ICD-10-CM | POA: Diagnosis not present

## 2021-11-12 DIAGNOSIS — I5030 Unspecified diastolic (congestive) heart failure: Secondary | ICD-10-CM | POA: Diagnosis not present

## 2021-11-12 LAB — URINALYSIS, MICROSCOPIC (REFLEX)

## 2021-11-12 LAB — HEPATIC FUNCTION PANEL
ALT: 48 U/L — ABNORMAL HIGH (ref 0–44)
AST: 24 U/L (ref 15–41)
Albumin: 3.4 g/dL — ABNORMAL LOW (ref 3.5–5.0)
Alkaline Phosphatase: 456 U/L — ABNORMAL HIGH (ref 38–126)
Bilirubin, Direct: 0.1 mg/dL (ref 0.0–0.2)
Indirect Bilirubin: 0.6 mg/dL (ref 0.3–0.9)
Total Bilirubin: 0.7 mg/dL (ref 0.3–1.2)
Total Protein: 7.3 g/dL (ref 6.5–8.1)

## 2021-11-12 LAB — URINALYSIS, ROUTINE W REFLEX MICROSCOPIC
Bilirubin Urine: NEGATIVE
Glucose, UA: 100 mg/dL — AB
Ketones, ur: NEGATIVE mg/dL
Leukocytes,Ua: NEGATIVE
Nitrite: NEGATIVE
Protein, ur: 100 mg/dL — AB
Specific Gravity, Urine: 1.015 (ref 1.005–1.030)
pH: 5 (ref 5.0–8.0)

## 2021-11-12 LAB — CBC
HCT: 30.1 % — ABNORMAL LOW (ref 39.0–52.0)
Hemoglobin: 9.7 g/dL — ABNORMAL LOW (ref 13.0–17.0)
MCH: 27.4 pg (ref 26.0–34.0)
MCHC: 32.2 g/dL (ref 30.0–36.0)
MCV: 85 fL (ref 80.0–100.0)
Platelets: 383 10*3/uL (ref 150–400)
RBC: 3.54 MIL/uL — ABNORMAL LOW (ref 4.22–5.81)
RDW: 18.8 % — ABNORMAL HIGH (ref 11.5–15.5)
WBC: 15.1 10*3/uL — ABNORMAL HIGH (ref 4.0–10.5)
nRBC: 0 % (ref 0.0–0.2)

## 2021-11-12 LAB — BASIC METABOLIC PANEL
Anion gap: 12 (ref 5–15)
BUN: 87 mg/dL — ABNORMAL HIGH (ref 8–23)
CO2: 19 mmol/L — ABNORMAL LOW (ref 22–32)
Calcium: 8.8 mg/dL — ABNORMAL LOW (ref 8.9–10.3)
Chloride: 104 mmol/L (ref 98–111)
Creatinine, Ser: 3.83 mg/dL — ABNORMAL HIGH (ref 0.61–1.24)
GFR, Estimated: 16 mL/min — ABNORMAL LOW (ref >60)
Glucose, Bld: 192 mg/dL — ABNORMAL HIGH (ref 70–99)
Potassium: 4.3 mmol/L (ref 3.5–5.1)
Sodium: 135 mmol/L (ref 135–145)

## 2021-11-12 LAB — TROPONIN I (HIGH SENSITIVITY): Troponin I (High Sensitivity): 41 ng/L — ABNORMAL HIGH (ref <18)

## 2021-11-12 LAB — CBG MONITORING, ED: Glucose-Capillary: 136 mg/dL — ABNORMAL HIGH (ref 70–99)

## 2021-11-12 LAB — BRAIN NATRIURETIC PEPTIDE: B Natriuretic Peptide: 1880.9 pg/mL — ABNORMAL HIGH (ref 0.0–100.0)

## 2021-11-12 MED ORDER — FUROSEMIDE 10 MG/ML IJ SOLN
60.0000 mg | Freq: Once | INTRAMUSCULAR | Status: AC
Start: 2021-11-12 — End: 2021-11-12
  Administered 2021-11-12: 60 mg via INTRAVENOUS
  Filled 2021-11-12: qty 6

## 2021-11-12 NOTE — ED Provider Notes (Signed)
?[facility] EMERGENCY DEPARTMENT ?Provider Note ? ? ?CSN: [id number] ?Arrival date & time: 11/12/21  1718 ? ?  ? ?History ? ?Chief Complaint  ?Patient presents with  ? Shortness of Breath  ? Weakness  ? ? ?Adam Reid is a 76 y.o. male. ? ?76 yo M with a chief complaints of difficulty breathing.  This has been going on for a couple months.  Felt like worse over the past 3 to 4 days.  Has noticed some swelling in his ankles.  He is on 2 L of oxygen at baseline.  Denies cough congestion or fever.  Denies abdominal pain.  Has been eating and drinking a little bit less than normal.  Been feeling much more weak.  Denies chest pain or pressure.  Per his family member he is having worsening renal dysfunction and has had to come into the hospital for too much fluid on him.  Has been taking his diuretics but has not taken the increased dose this was discussed with his nephrologist when he started feeling worse. ? ? ?Shortness of Breath ?Weakness ?Associated symptoms: shortness of breath   ? ?  ? ?Home Medications ?Prior to Admission medications   ?Medication Sig Start Date End Date Taking? Authorizing Provider  ?amiodarone (PACERONE) 200 MG tablet Take 1 tablet (200 mg total) by mouth daily. Follow up appt required for further refills. Please call [phone] to schedule an appt 01/28/21   Bensimhon, Shaune Pascal, MD  ?atorvastatin (LIPITOR) 80 MG tablet TAKE 1 TABLET(80 MG) BY MOUTH DAILY 12/22/20   [name], MD  ?blood glucose meter kit and supplies Use up to two times daily as directed  ICD10 E10.9 E11.9 02/20/19   [name], MD  ?Cholecalciferol (VITAMIN D3 PO) Take 1 tablet by mouth daily.    [provider]  ?citalopram (CELEXA) 20 MG tablet Take 20 mg by mouth daily.    [provider]  ?diltiazem (CARDIZEM CD) 180 MG 24 hr capsule TAKE 1 CAPSULE(180 MG) BY MOUTH DAILY 08/24/21   Bensimhon, Shaune Pascal, MD  ?ELIQUIS 2.5 MG TABS tablet TAKE 1 TABLET(2.5 MG) BY MOUTH TWICE DAILY  10/30/21   Bensimhon, Shaune Pascal, MD  ?glipiZIDE (GLUCOTROL) 5 MG tablet Take 0.5 tablets (2.5 mg total) by mouth 2 (two) times daily before a meal. ?Patient taking differently: Take 2.5 mg by mouth 2 (two) times daily before a meal. 2 $Remov'5mg'xKQlbZ$  daily 09/24/21   [name], MD  ?GlucoCom Lancets MISC Use for home glucose monitoring 09/02/15   Harrison Mons, PA  ?glucose blood (ONETOUCH VERIO) test strip Test up to 2 times per day.  ?Uncontrolled diabetes with hyperglycemia and stage 4 CKD. 04/13/19   [name], MD  ?pantoprazole (PROTONIX) 40 MG tablet TAKE 1 TABLET(40 MG) BY MOUTH DAILY 04/27/21   Levin Erp, PA  ?polyethylene glycol (MIRALAX / GLYCOLAX) 17 g packet Take 17 g by mouth daily as needed for mild constipation or moderate constipation.     [provider]  ?sitaGLIPtin (JANUVIA) 25 MG tablet TAKE 1 TABLET(25 MG) BY MOUTH DAILY 08/31/21   [name], MD  ?tamsulosin (FLOMAX) 0.4 MG CAPS capsule Take 0.4 mg by mouth daily.    [provider]  ?torsemide (DEMADEX) 20 MG tablet Take 2 tablets (40 mg total) by mouth 2 (two) times daily. 10/05/21   Rafael Bihari, FNP  ?vitamin B-12 (CYANOCOBALAMIN) 500 MCG tablet Take 500 mcg by mouth daily.    [provider]  ?   ? ?Allergies    ?Pork-derived products   ? ?Review of Systems   ?Review of Systems  ?Respiratory:  Positive for shortness of breath.   ?Neurological:  Positive for weakness.  ? ?Physical Exam ?Updated Vital Signs ?BP (!) 157/80   Pulse 78   Temp 97.6 ?F (36.4 ?C)   Resp (!) 24   Ht $R'5\' 6"'NI$  (1.676 m)   Wt 55 kg   SpO2 92%   BMI 19.57 kg/m?  ?Physical Exam ?Vitals and nursing note reviewed.  ?Constitutional:   ?   Appearance: He is well-developed.  ?HENT:  ?   Head: Normocephalic and atraumatic.  ?Eyes:  ?   Pupils: Pupils are equal, round, and reactive to light.  ?Neck:  ?   Vascular: JVD (JVD to the angle of the jaw) present.  ?Cardiovascular:  ?   Rate and Rhythm: Normal rate and regular  rhythm.  ?   Heart sounds: No murmur heard. ?  No friction rub. No gallop.  ?Pulmonary:  ?   Effort: No respiratory distress.  ?   Breath sounds: Examination of the right-lower field reveals rales. Examination of the left-lower field reveals rales. Rales present. No wheezing.  ?   Comments: Rales to about the mid lung.  Worse on the right than the left. ?Abdominal:  ?   General: There is no distension.  ?   Tenderness: There is no abdominal tenderness. There is no guarding or rebound.  ?Musculoskeletal:     ?   General: Normal range of motion.  ?   Cervical back: Normal range of motion and neck supple.  ?   Right lower leg: Edema present.  ?   Left lower leg: Edema present.  ?   Comments: 1+ pitting edema to the shins.  ?Skin: ?   Coloration: Skin is not pale.  ?   Findings: No rash.  ?Neurological:  ?   Mental Status: He is alert and oriented to person, place, and time.  ?Psychiatric:     ?   Behavior: Behavior normal.  ? ? ?ED Results / Procedures / Treatments   ?Labs ?(all labs ordered are listed, but only abnormal results are displayed) ?Labs Reviewed  ?BASIC METABOLIC PANEL - Abnormal; Notable for the following components:  ?    Result Value  ? CO2 19 (*)   ? Glucose, Bld 192 (*)   ? BUN 87 (*)   ? Creatinine, Ser 3.83 (*)   ? Calcium 8.8 (*)   ? GFR, Estimated 16 (*)   ? All other components within normal limits  ?CBC - Abnormal; Notable for the following components:  ? WBC 15.1 (*)   ? RBC 3.54 (*)   ? Hemoglobin 9.7 (*)   ? HCT 30.1 (*)   ? RDW 18.8 (*)   ? All other components within normal limits  ?URINALYSIS, ROUTINE W REFLEX MICROSCOPIC - Abnormal; Notable for the following components:  ? Glucose, UA 100 (*)   ? Hgb urine dipstick SMALL (*)   ? Protein, ur 100 (*)   ? All other components within normal limits  ?HEPATIC FUNCTION PANEL - Abnormal; Notable for the following components:  ? Albumin 3.4 (*)   ? ALT 48 (*)   ? Alkaline Phosphatase 456 (*)   ? All other components within normal limits  ?BRAIN  NATRIURETIC PEPTIDE - Abnormal; Notable for the following components:  ? B Natriuretic Peptide 1,880.9 (*)   ? All other  components within normal limits  ?URINALYSIS, MICROSCOPIC (REFLEX) - Abnormal; Notable for the following components:  ? Bacteria, UA MANY (*)   ? All other components within normal limits  ?CBG MONITORING, ED - Abnormal; Notable for the following components:  ? Glucose-Capillary 136 (*)   ? All other components within normal limits  ?TROPONIN I (HIGH SENSITIVITY) - Abnormal; Notable for the following components:  ? Troponin I (High Sensitivity) 41 (*)   ? All other components within normal limits  ? ? ?EKG ?EKG Interpretation ? ?Date/Time:  Thursday Nov 12 2021 17:34:14 EDT ?Ventricular Rate:  78 ?PR Interval:  182 ?QRS Duration: 87 ?QT Interval:  431 ?QTC Calculation: 491 ?R Axis:   22 ?Text Interpretation: Sinus rhythm Left ventricular hypertrophy Anterior infarct, old No significant change since last tracing Confirmed by Deno Etienne [phone]) on 11/12/2021 8:10:46 PM ? ?Radiology ?DG Chest Port 1 View ? ?Result Date: 11/12/2021 ?CLINICAL DATA:  Weakness, fluid in lungs, increased shortness of breath EXAM: PORTABLE CHEST 1 VIEW COMPARISON:  Portable exam 1801 hours compared to 11/09/2021 FINDINGS: Enlargement of cardiac silhouette. Mediastinal contours and pulmonary vascularity normal. Atherosclerotic calcification aorta. Areas of parenchymal scarring in both lungs with persistent bibasilar pleural effusions and atelectasis. Coexistent infiltrate in lower RIGHT lung not excluded. Rounded opacity in the lower RIGHT lung on the previous exam obscured. Upper lungs clear. No pneumothorax or acute osseous findings. IMPRESSION: Persistent BILATERAL pleural effusions and basilar atelectasis as well as peripheral areas of scarring in both lungs. Cannot exclude coexistent infiltrate in the lower RIGHT lung. Enlargement of cardiac silhouette. Aortic Atherosclerosis (ICD10-I70.0). Electronically Signed   By: Lavonia Dana M.D.   On: 11/12/2021 18:14   ? ?Procedures ?Procedures  ? ? ?Medications Ordered in ED ?Medications  ?furosemide (LASIX) injection 60 mg (60 mg Intravenous Given 11/12/21 1916)  ? ? ?ED Course/ Medical [facility]

## 2021-11-12 NOTE — ED Triage Notes (Signed)
Patient c/o increased shortness of breath. Patient was triaged at [facility] 3 days ago but was unable to stay for treatment.  ?Per family he is normally on 2l o2. ?Saturation level on RA is 89% ?Patient speaking in full sentences. States he is feeling more weak today.  ? ?Family at bedside to translate.  ?

## 2021-11-12 NOTE — ED Notes (Signed)
Pls contact the wife, if they have any questions and when he is being transferred. Currently unassigned bed.  ?

## 2021-11-12 NOTE — ED Notes (Addendum)
Pts o2 increased to 4LNC.  ?

## 2021-11-12 NOTE — Progress Notes (Signed)
Plan of Care Note for accepted transfer ? ? ?Patient: Adam Reid MRN: [id number]   [place]: 11/12/2021 ? ?Facility requesting transfer: [facility] emergency department ?Requesting Provider: Dr. Deno Etienne ?Reason for transfer: Acute diastolic congestive heart failure ?Facility course:  ? ?76 year old male with past medical history of diastolic congestive heart failure, diabetes mellitus type 2, chronic kidney disease stage IV, hypertension, hyperlipidemia, hypothyroidism who presented to [facility] emergency department with complaints of shortness of breath and fatigue. ? ?Of note, patient was recently hospitalized at [facility] from 3/6 until 3/10 for a similar presentation of acute diastolic congestive heart failure. ? ?On this occasion, patient states that he is been experiencing progressively worsening shortness of breath particularly over the past 48 hours.  Upon evaluation in the emergency department patient was found to be tachypneic with evidence of significant peripheral lower extremity edema and jugular venous distention.  Chest x-ray was performed revealing bilateral pleural effusions.  BNP was obtained and was found to be 1880, a marked elevation compared to 812 on 4/5. ? ?Patient was felt to be suffering from recurrent acute diastolic congestive heart failure and was initiated on 60 mg of intravenous Lasix.   ? ?Plan of care: ?The patient is accepted for admission to Telemetry unit, at [facility]  ? ? ?Author: ?Vernelle Emerald, MD ?11/12/2021 ? ?Check [email or web address] for on-call coverage. ? ?Nursing staff, Please call [place] number on Amion as soon as patient's arrival, so appropriate admitting provider can evaluate the pt. ?

## 2021-11-12 NOTE — ED Notes (Signed)
Checked CBG 136 ?

## 2021-11-13 ENCOUNTER — Encounter (HOSPITAL_COMMUNITY): Payer: Self-pay | Admitting: Internal Medicine

## 2021-11-13 ENCOUNTER — Other Ambulatory Visit: Payer: Self-pay | Admitting: Family Medicine

## 2021-11-13 DIAGNOSIS — E782 Mixed hyperlipidemia: Secondary | ICD-10-CM | POA: Diagnosis present

## 2021-11-13 DIAGNOSIS — J189 Pneumonia, unspecified organism: Secondary | ICD-10-CM | POA: Diagnosis not present

## 2021-11-13 DIAGNOSIS — E1122 Type 2 diabetes mellitus with diabetic chronic kidney disease: Secondary | ICD-10-CM

## 2021-11-13 DIAGNOSIS — R54 Age-related physical debility: Secondary | ICD-10-CM | POA: Diagnosis present

## 2021-11-13 DIAGNOSIS — I10 Essential (primary) hypertension: Secondary | ICD-10-CM | POA: Diagnosis not present

## 2021-11-13 DIAGNOSIS — E876 Hypokalemia: Secondary | ICD-10-CM | POA: Diagnosis not present

## 2021-11-13 DIAGNOSIS — K4020 Bilateral inguinal hernia, without obstruction or gangrene, not specified as recurrent: Secondary | ICD-10-CM | POA: Diagnosis not present

## 2021-11-13 DIAGNOSIS — E43 Unspecified severe protein-calorie malnutrition: Secondary | ICD-10-CM | POA: Diagnosis not present

## 2021-11-13 DIAGNOSIS — Z681 Body mass index (BMI) 19 or less, adult: Secondary | ICD-10-CM | POA: Diagnosis not present

## 2021-11-13 DIAGNOSIS — E119 Type 2 diabetes mellitus without complications: Secondary | ICD-10-CM | POA: Diagnosis not present

## 2021-11-13 DIAGNOSIS — E872 Acidosis, unspecified: Secondary | ICD-10-CM | POA: Diagnosis not present

## 2021-11-13 DIAGNOSIS — J440 Chronic obstructive pulmonary disease with acute lower respiratory infection: Secondary | ICD-10-CM | POA: Diagnosis not present

## 2021-11-13 DIAGNOSIS — J90 Pleural effusion, not elsewhere classified: Secondary | ICD-10-CM | POA: Diagnosis not present

## 2021-11-13 DIAGNOSIS — J9811 Atelectasis: Secondary | ICD-10-CM | POA: Diagnosis not present

## 2021-11-13 DIAGNOSIS — I5033 Acute on chronic diastolic (congestive) heart failure: Secondary | ICD-10-CM

## 2021-11-13 DIAGNOSIS — Z9981 Dependence on supplemental oxygen: Secondary | ICD-10-CM | POA: Diagnosis not present

## 2021-11-13 DIAGNOSIS — E079 Disorder of thyroid, unspecified: Secondary | ICD-10-CM | POA: Diagnosis not present

## 2021-11-13 DIAGNOSIS — I130 Hypertensive heart and chronic kidney disease with heart failure and stage 1 through stage 4 chronic kidney disease, or unspecified chronic kidney disease: Secondary | ICD-10-CM | POA: Diagnosis not present

## 2021-11-13 DIAGNOSIS — N2889 Other specified disorders of kidney and ureter: Secondary | ICD-10-CM | POA: Diagnosis not present

## 2021-11-13 DIAGNOSIS — I2510 Atherosclerotic heart disease of native coronary artery without angina pectoris: Secondary | ICD-10-CM | POA: Diagnosis not present

## 2021-11-13 DIAGNOSIS — K219 Gastro-esophageal reflux disease without esophagitis: Secondary | ICD-10-CM | POA: Diagnosis present

## 2021-11-13 DIAGNOSIS — D631 Anemia in chronic kidney disease: Secondary | ICD-10-CM | POA: Diagnosis present

## 2021-11-13 DIAGNOSIS — R531 Weakness: Secondary | ICD-10-CM | POA: Diagnosis not present

## 2021-11-13 DIAGNOSIS — D483 Neoplasm of uncertain behavior of retroperitoneum: Secondary | ICD-10-CM | POA: Diagnosis not present

## 2021-11-13 DIAGNOSIS — E039 Hypothyroidism, unspecified: Secondary | ICD-10-CM | POA: Diagnosis present

## 2021-11-13 DIAGNOSIS — I480 Paroxysmal atrial fibrillation: Secondary | ICD-10-CM | POA: Diagnosis not present

## 2021-11-13 DIAGNOSIS — I5030 Unspecified diastolic (congestive) heart failure: Secondary | ICD-10-CM | POA: Diagnosis present

## 2021-11-13 DIAGNOSIS — Z7901 Long term (current) use of anticoagulants: Secondary | ICD-10-CM | POA: Diagnosis not present

## 2021-11-13 DIAGNOSIS — Z79899 Other long term (current) drug therapy: Secondary | ICD-10-CM | POA: Diagnosis not present

## 2021-11-13 DIAGNOSIS — K689 Other disorders of retroperitoneum: Secondary | ICD-10-CM | POA: Diagnosis not present

## 2021-11-13 DIAGNOSIS — N184 Chronic kidney disease, stage 4 (severe): Secondary | ICD-10-CM | POA: Diagnosis not present

## 2021-11-13 DIAGNOSIS — R1900 Intra-abdominal and pelvic swelling, mass and lump, unspecified site: Secondary | ICD-10-CM | POA: Diagnosis not present

## 2021-11-13 DIAGNOSIS — I509 Heart failure, unspecified: Secondary | ICD-10-CM

## 2021-11-13 DIAGNOSIS — Z7984 Long term (current) use of oral hypoglycemic drugs: Secondary | ICD-10-CM | POA: Diagnosis not present

## 2021-11-13 DIAGNOSIS — I340 Nonrheumatic mitral (valve) insufficiency: Secondary | ICD-10-CM | POA: Diagnosis present

## 2021-11-13 DIAGNOSIS — D649 Anemia, unspecified: Secondary | ICD-10-CM | POA: Diagnosis not present

## 2021-11-13 DIAGNOSIS — E278 Other specified disorders of adrenal gland: Secondary | ICD-10-CM | POA: Diagnosis not present

## 2021-11-13 DIAGNOSIS — J9611 Chronic respiratory failure with hypoxia: Secondary | ICD-10-CM | POA: Diagnosis present

## 2021-11-13 DIAGNOSIS — M898X9 Other specified disorders of bone, unspecified site: Secondary | ICD-10-CM | POA: Diagnosis present

## 2021-11-13 DIAGNOSIS — R1902 Left upper quadrant abdominal swelling, mass and lump: Secondary | ICD-10-CM | POA: Diagnosis not present

## 2021-11-13 LAB — CBC
HCT: 29.7 % — ABNORMAL LOW (ref 39.0–52.0)
Hemoglobin: 9.5 g/dL — ABNORMAL LOW (ref 13.0–17.0)
MCH: 27.2 pg (ref 26.0–34.0)
MCHC: 32 g/dL (ref 30.0–36.0)
MCV: 85.1 fL (ref 80.0–100.0)
Platelets: 353 10*3/uL (ref 150–400)
RBC: 3.49 MIL/uL — ABNORMAL LOW (ref 4.22–5.81)
RDW: 18.9 % — ABNORMAL HIGH (ref 11.5–15.5)
WBC: 11.7 10*3/uL — ABNORMAL HIGH (ref 4.0–10.5)
nRBC: 0 % (ref 0.0–0.2)

## 2021-11-13 LAB — BRAIN NATRIURETIC PEPTIDE: B Natriuretic Peptide: 1516.2 pg/mL — ABNORMAL HIGH (ref 0.0–100.0)

## 2021-11-13 LAB — GLUCOSE, CAPILLARY
Glucose-Capillary: 169 mg/dL — ABNORMAL HIGH (ref 70–99)
Glucose-Capillary: 174 mg/dL — ABNORMAL HIGH (ref 70–99)

## 2021-11-13 LAB — BASIC METABOLIC PANEL
Anion gap: 16 — ABNORMAL HIGH (ref 5–15)
BUN: 84 mg/dL — ABNORMAL HIGH (ref 8–23)
CO2: 19 mmol/L — ABNORMAL LOW (ref 22–32)
Calcium: 9 mg/dL (ref 8.9–10.3)
Chloride: 102 mmol/L (ref 98–111)
Creatinine, Ser: 3.69 mg/dL — ABNORMAL HIGH (ref 0.61–1.24)
GFR, Estimated: 16 mL/min — ABNORMAL LOW (ref >60)
Glucose, Bld: 189 mg/dL — ABNORMAL HIGH (ref 70–99)
Potassium: 3.6 mmol/L (ref 3.5–5.1)
Sodium: 137 mmol/L (ref 135–145)

## 2021-11-13 LAB — TSH: TSH: 4.956 u[IU]/mL — ABNORMAL HIGH (ref 0.350–4.500)

## 2021-11-13 LAB — TROPONIN I (HIGH SENSITIVITY): Troponin I (High Sensitivity): 40 ng/L — ABNORMAL HIGH (ref <18)

## 2021-11-13 MED ORDER — FUROSEMIDE 10 MG/ML IJ SOLN
20.0000 mg | Freq: Once | INTRAMUSCULAR | Status: AC
  Administered 2021-11-13: 20 mg via INTRAVENOUS
  Filled 2021-11-13: qty 2

## 2021-11-13 MED ORDER — ACETAMINOPHEN 325 MG PO TABS: 650.0000 mg | ORAL_TABLET | ORAL | Status: DC | PRN

## 2021-11-13 MED ORDER — HYDRALAZINE HCL 25 MG PO TABS: 25.0000 mg | ORAL_TABLET | Freq: Four times a day (QID) | ORAL | Status: DC | PRN

## 2021-11-13 MED ORDER — SODIUM CHLORIDE 0.9 % IV SOLN
250.0000 mL | INTRAVENOUS | Status: DC | PRN
  Administered 2021-11-16 – 2021-11-18 (×2): 250 mL via INTRAVENOUS

## 2021-11-13 MED ORDER — ONDANSETRON HCL 4 MG/2ML IJ SOLN: 4.0000 mg | Freq: Four times a day (QID) | INTRAMUSCULAR | Status: DC | PRN

## 2021-11-13 MED ORDER — DILTIAZEM HCL ER COATED BEADS 240 MG PO CP24
240.0000 mg | ORAL_CAPSULE | Freq: Every day | ORAL | Status: DC
  Administered 2021-11-14 – 2021-11-22 (×9): 240 mg via ORAL
  Filled 2021-11-13 (×9): qty 1

## 2021-11-13 MED ORDER — INSULIN ASPART 100 UNIT/ML IJ SOLN
0.0000 [IU] | Freq: Three times a day (TID) | INTRAMUSCULAR | Status: DC
  Administered 2021-11-13: 2 [IU] via SUBCUTANEOUS
  Administered 2021-11-14: 1 [IU] via SUBCUTANEOUS
  Administered 2021-11-14: 3 [IU] via SUBCUTANEOUS

## 2021-11-13 MED ORDER — POTASSIUM CHLORIDE CRYS ER 20 MEQ PO TBCR
40.0000 meq | EXTENDED_RELEASE_TABLET | Freq: Once | ORAL | Status: AC
  Administered 2021-11-13: 40 meq via ORAL
  Filled 2021-11-13: qty 2

## 2021-11-13 MED ORDER — CITALOPRAM HYDROBROMIDE 20 MG PO TABS
20.0000 mg | ORAL_TABLET | Freq: Every day | ORAL | Status: DC
  Administered 2021-11-13 – 2021-11-22 (×10): 20 mg via ORAL
  Filled 2021-11-13 (×10): qty 1

## 2021-11-13 MED ORDER — ATORVASTATIN CALCIUM 80 MG PO TABS
80.0000 mg | ORAL_TABLET | Freq: Every day | ORAL | Status: DC
  Administered 2021-11-13 – 2021-11-22 (×10): 80 mg via ORAL
  Filled 2021-11-13 (×10): qty 1

## 2021-11-13 MED ORDER — SODIUM CHLORIDE 0.9% FLUSH
3.0000 mL | Freq: Two times a day (BID) | INTRAVENOUS | Status: DC
  Administered 2021-11-13 – 2021-11-22 (×17): 3 mL via INTRAVENOUS

## 2021-11-13 MED ORDER — TAMSULOSIN HCL 0.4 MG PO CAPS
0.4000 mg | ORAL_CAPSULE | Freq: Every day | ORAL | Status: DC
  Administered 2021-11-13 – 2021-11-22 (×10): 0.4 mg via ORAL
  Filled 2021-11-13 (×10): qty 1

## 2021-11-13 MED ORDER — FUROSEMIDE 10 MG/ML IJ SOLN
80.0000 mg | Freq: Two times a day (BID) | INTRAMUSCULAR | Status: DC
  Administered 2021-11-14 – 2021-11-17 (×7): 80 mg via INTRAVENOUS
  Filled 2021-11-13 (×7): qty 8

## 2021-11-13 MED ORDER — POLYETHYLENE GLYCOL 3350 17 G PO PACK: 17.0000 g | PACK | Freq: Every day | ORAL | Status: DC | PRN

## 2021-11-13 MED ORDER — ENSURE ENLIVE PO LIQD: 237.0000 mL | Freq: Two times a day (BID) | ORAL | Status: DC

## 2021-11-13 MED ORDER — APIXABAN 2.5 MG PO TABS
2.5000 mg | ORAL_TABLET | Freq: Two times a day (BID) | ORAL | Status: DC
  Administered 2021-11-13 – 2021-11-17 (×10): 2.5 mg via ORAL
  Filled 2021-11-13 (×10): qty 1

## 2021-11-13 MED ORDER — ENSURE ENLIVE PO LIQD
237.0000 mL | Freq: Three times a day (TID) | ORAL | Status: DC
  Administered 2021-11-13 – 2021-11-20 (×14): 237 mL via ORAL

## 2021-11-13 MED ORDER — DILTIAZEM HCL 60 MG PO TABS
60.0000 mg | ORAL_TABLET | Freq: Four times a day (QID) | ORAL | Status: DC
  Administered 2021-11-13: 60 mg via ORAL
  Filled 2021-11-13: qty 1

## 2021-11-13 MED ORDER — AMIODARONE HCL 200 MG PO TABS
200.0000 mg | ORAL_TABLET | Freq: Every day | ORAL | Status: DC
  Administered 2021-11-13 – 2021-11-22 (×10): 200 mg via ORAL
  Filled 2021-11-13 (×10): qty 1

## 2021-11-13 MED ORDER — LINAGLIPTIN 5 MG PO TABS
5.0000 mg | ORAL_TABLET | Freq: Every day | ORAL | Status: DC
  Administered 2021-11-13 – 2021-11-22 (×10): 5 mg via ORAL
  Filled 2021-11-13 (×10): qty 1

## 2021-11-13 MED ORDER — SODIUM BICARBONATE 650 MG PO TABS
650.0000 mg | ORAL_TABLET | Freq: Two times a day (BID) | ORAL | Status: DC
  Administered 2021-11-13 – 2021-11-22 (×19): 650 mg via ORAL
  Filled 2021-11-13 (×19): qty 1

## 2021-11-13 MED ORDER — PANTOPRAZOLE SODIUM 40 MG PO TBEC
40.0000 mg | DELAYED_RELEASE_TABLET | Freq: Every day | ORAL | Status: DC
  Administered 2021-11-13 – 2021-11-22 (×10): 40 mg via ORAL
  Filled 2021-11-13 (×10): qty 1

## 2021-11-13 MED ORDER — SODIUM CHLORIDE 0.9% FLUSH
3.0000 mL | INTRAVENOUS | Status: DC | PRN
  Administered 2021-11-21: 3 mL via INTRAVENOUS

## 2021-11-13 MED ORDER — CALCIUM ACETATE (PHOS BINDER) 667 MG PO CAPS
667.0000 mg | ORAL_CAPSULE | Freq: Three times a day (TID) | ORAL | Status: DC
  Administered 2021-11-13 – 2021-11-22 (×24): 667 mg via ORAL
  Filled 2021-11-13 (×26): qty 1

## 2021-11-13 MED ORDER — FUROSEMIDE 10 MG/ML IJ SOLN
40.0000 mg | Freq: Every day | INTRAMUSCULAR | Status: DC
  Administered 2021-11-13: 40 mg via INTRAVENOUS
  Filled 2021-11-13: qty 4

## 2021-11-13 NOTE — H&P (Signed)
?History and Physical  ? ? Kullen Tomasetti YIR:[id number] DOB: 1946/07/04 DOA: 11/12/2021 ? ?PCP: [name], MD (Confirm with patient/family/NH records and if not entered, this has to be entered at [facility] point of entry) ?Patient coming from: Home ? ?I have personally briefly reviewed patient's old medical records in [place] ? ?Chief Complaint: SOB ? ?HPI: Keymon Mcelroy is a 76 y.o. male with medical history significant of CKD stage IV, chronic diastolic CHF, IIDM, chronic hypoxic respiratory failure on 2 L 24/7, HTN, PAF on Eliquis, moderate protein calorie malnutrition, presented with increasing shortness of breath. ? ?Patient only speaks [place], with wife speaking English as interpreter over the phone.  At baseline, patient has CKD stage IV and chronic diastolic CHF, managed by cardiology and nephrology.  Patient was hospitalized in March this year and was discharged on torsemide 20 mg twice daily.  Done in early April, patient was divided by cardiology and torsemide increased to 40 mg twice daily.  His fluid balance and weight has maintained while until 3 to 5 days ago, patient started to feel increasing exertional dyspnea, minimum activity walking inside the room triggered shortness of breath.  He has been on home O2 since April this year.  Denies any cough, no chest pains.  Family called nephrology Dr. [name], recommend patient taking torsemide 60 mg in the morning and 40 mg in the evening, and patient followed but no significant improvement of breathing symptoms.  Patient weigh himself once a week, and found his weight has been stable around 116 pounds.  He estimated lost about 20 pounds since in the last year. ? ?ED Course: Blood pressure elevated systolic in the 500X, hypoxia stabilized on 4 L oxygen.  Chest x-ray continues to show bilateral pulmonary congestion and bilateral pleural effusions. ? ?Review of Systems: As per HPI otherwise 14 point review of systems negative.  ? ? ?Past Medical  History:  ?Diagnosis Date  ? Anemia   ? CHF (congestive heart failure) ([place])   ? Chronic kidney disease   ? Depression   ? 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 GERD (gastroesophageal reflux disease)   ? History of blood transfusion   ? Hyperlipidemia   ? Hypertension   ? Thyroid disease   ? was on supplement, taken off by [name]  ? ? ?Past Surgical History:  ?Procedure Laterality Date  ? APPENDECTOMY    ? AV FISTULA PLACEMENT Right 09/04/2021  ? Procedure: RIGHT BRACHIOCEPHALIC ARTERIOVENOUS (AV) FISTULA CREATION;  Surgeon: [name], MD;  Location: [place];  Service: Vascular;  Laterality: Right;  ? BIOPSY  12/25/2020  ? Procedure: BIOPSY;  Surgeon: [name]., MD;  Location: [place];  Service: Gastroenterology;;  ? CARDIAC CATHETERIZATION N/A 01/30/2016  ? Procedure: Left Heart Cath and Coronary Angiography;  Surgeon: [name], MD;  Location: [place] [facility];  Service: Cardiovascular;  Laterality: N/A;  ? ENTEROSCOPY N/A 12/25/2020  ? Procedure: ENTEROSCOPY;  Surgeon: [name], MD;  Location: [name];  Service: Gastroenterology;  Laterality: N/A;  ? GIVENS CAPSULE STUDY N/A 12/25/2020  ? Procedure: GIVENS CAPSULE STUDY;  Surgeon: [name]., MD;  Location: [place];  Service: Gastroenterology;  Laterality: N/A;  ? SUBMUCOSAL TATTOO INJECTION  12/25/2020  ? Procedure: SUBMUCOSAL TATTOO INJECTION;  Surgeon: [name]., MD;  Location: [place];  Service: Gastroenterology;;  ? ? ? reports that he has been smoking cigarettes. He has a 4.80 pack-year  smoking history. He has never used smokeless tobacco. He reports that he does not drink alcohol and does not use drugs. ? ?Allergies  ?Allergen Reactions  ? Pork-Derived Products   ? ? ?Family History  ?Problem Relation Age of Onset  ? Hypertension Mother   ? Hyperlipidemia Mother   ? Hypertension Sister   ? Kidney disease Father   ? Heart disease Brother 62  ?      open heart surgery  ? Colon cancer Neg Hx   ? ? ? ?Prior to Admission medications   ?Medication Sig Start Date End Date Taking? Authorizing Provider  ?amiodarone (PACERONE) 200 MG tablet Take 1 tablet (200 mg total) by mouth daily. Follow up appt required for further refills. Please call [phone] to schedule an appt 01/28/21  Yes Bensimhon, Shaune Pascal, MD  ?atorvastatin (LIPITOR) 80 MG tablet TAKE 1 TABLET(80 MG) BY MOUTH DAILY 12/22/20  Yes [name], MD  ?Cholecalciferol (VITAMIN D3 PO) Take 1 tablet by mouth daily.   Yes [provider]  ?citalopram (CELEXA) 20 MG tablet Take 20 mg by mouth daily.   Yes [provider]  ?diltiazem (CARDIZEM CD) 180 MG 24 hr capsule TAKE 1 CAPSULE(180 MG) BY MOUTH DAILY 08/24/21  Yes Bensimhon, Shaune Pascal, MD  ?ELIQUIS 2.5 MG TABS tablet TAKE 1 TABLET(2.5 MG) BY MOUTH TWICE DAILY 10/30/21  Yes Bensimhon, Shaune Pascal, MD  ?glipiZIDE (GLUCOTROL) 5 MG tablet Take 0.5 tablets (2.5 mg total) by mouth 2 (two) times daily before a meal. ?Patient taking differently: Take 2.5 mg by mouth 2 (two) times daily before a meal. 2 $Remov'5mg'uAbjho$  daily 09/24/21  Yes [name], MD  ?pantoprazole (PROTONIX) 40 MG tablet TAKE 1 TABLET(40 MG) BY MOUTH DAILY 04/27/21  Yes Levin Erp, PA  ?polyethylene glycol (MIRALAX / GLYCOLAX) 17 g packet Take 17 g by mouth daily as needed for mild constipation or moderate constipation.    Yes [provider]  ?sitaGLIPtin (JANUVIA) 25 MG tablet TAKE 1 TABLET(25 MG) BY MOUTH DAILY 08/31/21  Yes [name], MD  ?tamsulosin (FLOMAX) 0.4 MG CAPS capsule Take 0.4 mg by mouth daily.   Yes [provider]  ?torsemide (DEMADEX) 20 MG tablet Take 2 tablets (40 mg total) by mouth 2 (two) times daily. 10/05/21  Yes Milford, Maricela Bo, FNP  ?vitamin B-12 (CYANOCOBALAMIN) 500 MCG tablet Take 500 mcg by mouth daily.   Yes [provider]  ?blood glucose meter kit and supplies Use up to two times daily as directed   ICD10 E10.9 E11.9 02/20/19   [name], MD  ?GlucoCom Lancets MISC Use for home glucose monitoring 09/02/15   Harrison Mons, PA  ?glucose blood (ONETOUCH VERIO) test strip Test up to 2 times per day.  ?Uncontrolled diabetes with hyperglycemia and stage 4 CKD. 04/13/19   [name], MD  ? ? ?Physical Exam: ?Vitals:  ? 11/13/21 0745 11/13/21 0756 11/13/21 0900 11/13/21 1100  ?BP:   (!) 152/82 (!) 146/77  ?Pulse:  79 83 79  ?Resp:  (!) 22 (!) 27 (!) 25  ?Temp: 97.8 ?F (36.6 ?C) 97.8 ?F (36.6 ?C)    ?SpO2:  96% 98% 97%  ?Weight:      ?Height:      ? ? ?Constitutional: NAD, calm, comfortable ?Vitals:  ? 11/13/21 0745 11/13/21 0756 11/13/21 0900 11/13/21 1100  ?BP:   (!) 152/82 (!) 146/77  ?Pulse:  79 83 79  ?Resp:  (!) 22 (!) 27 [name])  25  ?Temp: 97.8 ?F (36.6 ?C) 97.8 ?F (36.6 ?C)    ?SpO2:  96% 98% 97%  ?Weight:      ?Height:      ? ?Eyes: PERRL, lids and conjunctivae normal ?ENMT: Mucous membranes are moist. Posterior pharynx clear of any exudate or lesions.Normal dentition.  ?Neck: normal, supple, no masses, no thyromegaly ?Respiratory: clear to auscultation bilaterally, no wheezing, positive B/L crackles, increasing breathing effort, talking in broken sentences. No accessory muscle use.  ?Cardiovascular: Regular rate and rhythm, no murmurs / rubs / gallops. No extremity edema. 2+ pedal pulses. No carotid bruits.  ?Abdomen: no tenderness, no masses palpated. No hepatosplenomegaly. Bowel sounds positive.  ?Musculoskeletal: no clubbing / cyanosis. No joint deformity upper and lower extremities. Good ROM, no contractures. Normal muscle tone.  ?Skin: no rashes, lesions, ulcers. No induration ?Neurologic: CN 2-12 grossly intact. Sensation intact, DTR normal. Strength 5/5 in all 4.  ?Psychiatric: Normal judgment and insight. Alert and oriented x 3. Normal mood.  ? ? ?Labs on Admission: I have personally reviewed following labs and imaging studies ? ?CBC: ?Recent Labs  ?Lab 11/09/21 ?1951 11/12/21 ?1730  11/13/21 ?0750  ?WBC 13.1* 15.1* 11.7*  ?HGB 9.0* 9.7* 9.5*  ?HCT 28.7* 30.1* 29.7*  ?MCV 86.2 85.0 85.1  ?PLT 358 383 353  ? ?Basic Metabolic Panel: ?Recent Labs  ?Lab 11/09/21 ?1950 11/12/21 ?1730 11/13/21 ?08

## 2021-11-13 NOTE — ED Notes (Signed)
AV fistula right arm with thrill and bruit present ?

## 2021-11-13 NOTE — Progress Notes (Signed)
Initial Nutrition Assessment ? ?DOCUMENTATION CODES:  ? ?Severe malnutrition in context of chronic illness, Underweight ? ?INTERVENTION:  ?Provide Ensure Enlive po TID, each supplement provides 350 kcal and 20 grams of protein. ? ?Encourage adequate PO intake.  ? ?NUTRITION DIAGNOSIS:  ? ?Severe Malnutrition related to chronic illness (CHF) as evidenced by severe fat depletion, severe muscle depletion. ? ?GOAL:  ? ?Patient will meet greater than or equal to 90% of their needs ? ?MONITOR:  ? ?PO intake, Supplement acceptance, Labs, Weight trends, Skin, I & O's ? ?REASON FOR ASSESSMENT:  ? ?Consult ?Assessment of nutrition requirement/status ? ?ASSESSMENT:  ? ?76 y.o. male with medical history significant of CKD stage IV, chronic diastolic CHF, IIDM, chronic hypoxic respiratory failure on 2 L 24/7, HTN, PAF on Eliquis, moderate protein calorie malnutrition, presented with increasing shortness of breath ? ?Meal completion has been 25% at lunch today. Pt reports no abdominal pains or discomfort at time of visit. Pt unable to provide usual nutrition history. RD to order nutritional supplements to aid in caloric and protein needs. Pt encouraged to eat his food at meals and to drink his supplements.  ? ?NUTRITION - FOCUSED PHYSICAL EXAM: ? ?Flowsheet Row Most Recent Value  ?Orbital Region Mild depletion  ?Upper Arm Region Severe depletion  ?Thoracic and Lumbar Region Severe depletion  ?Buccal Region Moderate depletion  ?Temple Region Moderate depletion  ?Clavicle Bone Region Severe depletion  ?Clavicle and Acromion Bone Region Severe depletion  ?Scapular Bone Region Unable to assess  ?Dorsal Hand Unable to assess  ?Patellar Region Severe depletion  ?Anterior Thigh Region Severe depletion  ?Posterior Calf Region Severe depletion  ?Edema (RD Assessment) None  ?Hair Reviewed  ?Eyes Reviewed  ?Mouth Reviewed  ?Skin Reviewed  ?Nails Reviewed  ? ?  ? ?Labs and medications reviewed.  ? ?Diet Order:   ?Diet Order   ? ?       ?   Diet renal/carb modified with fluid restriction Diet-HS Snack? Nothing; Fluid restriction: 1200 mL Fluid; Room service appropriate? Yes; Fluid consistency: Thin  Diet effective now       ?  ? ?  ?  ? ?  ? ? ?EDUCATION NEEDS:  ? ?Not appropriate for education at this time ? ?Skin:  Skin Assessment: Reviewed RN Assessment ? ?Last BM:  Unknown ? ?Height:  ? ?Ht Readings from Last 1 Encounters:  ?11/13/21 '5\' 6"'$  (1.676 m)  ? ? ?Weight:  ? ?Wt Readings from Last 1 Encounters:  ?11/13/21 51.7 kg  ? ?BMI:  Body mass index is 18.4 kg/m?. ? ?Estimated Nutritional Needs:  ? ?Kcal:  1800-2000 ? ?Protein:  85-100 grams ? ?Fluid:  1.2 L/day ? ?[name], MS, RD, LDN ?RD pager number/after hours weekend pager number on Amion. ? ?

## 2021-11-13 NOTE — Consult Note (Signed)
?Cardiology Consultation:  ? ?Patient ID: Adam Reid ?MRN: [id number]; DOB: [birth year] ? ?Admit date: 11/12/2021 ?Date of Consult: 11/13/2021 ? ?PCP:  Adam Agreste, MD ?  ?Adam Reid ?Cardiologist:  Adam Munroe, MD     ? ? ?Patient Profile:  ? ?Adam Reid is a 76 y.o. male with a hx of chronic diastolic congestive heart failure, paroxysmal atrial fibrillation, chronic stage IV kidney disease, hypertension, hyperlipidemia, diabetes mellitus who is being seen 11/13/2021 for the evaluation of acute on chronic diastolic congestive heart failure at the request of Adam Fines, MD. ? ?History of Present Illness:  ? ?Cardiac catheterization July 2017 showed 20% ramus, 50% second diagonal, 20% mid LAD and normal LVEDP.  PYP scan April 2021 felt likely negative; multiple myeloma panel did not demonstrate monoclonal protein spike.  Monitor May 2021 showed sinus rhythm with short (longest 6 beats) runs of nonsustained ventricular tachycardia, short runs of atrial tachycardia, PACs and PVCs.  Last echocardiogram January 2023 showed normal LV function, mild left ventricular hypertrophy, mildly elevated pulmonary pressure, mild mitral regurgitation.  Patient is status post right AV fistula placement February 2023.  Also on home oxygen.  Last seen in CHF clinic April 2023.  Patient was admitted on May 4 with complaints of dyspnea and fatigue.  Cardiology is asked to evaluate for acute on chronic diastolic congestive heart failure. ? ?Patient speaks [place].  History is obtained with the assistance of an interpreter.  Patient states he has been dyspneic since he was seen in the clinic.  It has been severe and unchanged since that time.  There is no orthopnea by his report.  He states there was pedal edema but this has improved.  He denies chest pain.  No fevers or chills but he has had a cough.  No hemoptysis.  He has had some nausea. ? ? ?Past Medical History:  ?Diagnosis Date  ? Anemia   ? CHF  (congestive heart failure) ([place])   ? Chronic kidney disease   ? Depression   ? 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 GERD (gastroesophageal reflux disease)   ? History of blood transfusion   ? Hyperlipidemia   ? Hypertension   ? Thyroid disease   ? was on supplement, taken off by Adam Reid  ? ? ?Past Surgical History:  ?Procedure Laterality Date  ? APPENDECTOMY    ? AV FISTULA PLACEMENT Right 09/04/2021  ? Procedure: RIGHT BRACHIOCEPHALIC ARTERIOVENOUS (AV) FISTULA CREATION;  Surgeon: Adam Heck, MD;  Location: [place];  Service: Vascular;  Laterality: Right;  ? BIOPSY  12/25/2020  ? Procedure: BIOPSY;  Surgeon: Adam Reid., MD;  Location: [place];  Service: Gastroenterology;;  ? CARDIAC CATHETERIZATION N/A 01/30/2016  ? Procedure: Left Heart Cath and Coronary Angiography;  Surgeon: Adam Booze, MD;  Location: [place] [facility];  Service: Cardiovascular;  Laterality: N/A;  ? ENTEROSCOPY N/A 12/25/2020  ? Procedure: ENTEROSCOPY;  Surgeon: Adam Landmark Telford Nab., MD;  Location: [place];  Service: Gastroenterology;  Laterality: N/A;  ? GIVENS CAPSULE STUDY N/A 12/25/2020  ? Procedure: GIVENS CAPSULE STUDY;  Surgeon: Adam Reid., MD;  Location: [place];  Service: Gastroenterology;  Laterality: N/A;  ? SUBMUCOSAL TATTOO INJECTION  12/25/2020  ? Procedure: SUBMUCOSAL TATTOO INJECTION;  Surgeon: Adam Reid., MD;  Location: [place];  Service: Gastroenterology;;  ? ? ?Inpatient Medications: ?Scheduled Meds: ? amiodarone  200 mg Oral Daily  ? apixaban  2.5 mg Oral  BID  ? atorvastatin  80 mg Oral Daily  ? calcium acetate  667 mg Oral TID WC  ? citalopram  20 mg Oral Daily  ? diltiazem  60 mg Oral Q6H  ? feeding supplement  237 mL Oral TID BM  ? furosemide  40 mg Intravenous Daily  ? insulin aspart  0-9 Units Subcutaneous TID WC  ? linagliptin  5 mg Oral Daily  ? pantoprazole  40 mg Oral Daily  ? sodium bicarbonate  650 mg Oral BID  ?  sodium chloride flush  3 mL Intravenous Q12H  ? tamsulosin  0.4 mg Oral Daily  ? ?Continuous Infusions: ? sodium chloride    ? ?PRN Meds: ?sodium chloride, acetaminophen, hydrALAZINE, ondansetron (ZOFRAN) IV, polyethylene glycol, sodium chloride flush ? ?Allergies:    ?Allergies  ?Allergen Reactions  ? Beef-Derived Products   ? Pork-Derived Products   ? ? ?Social History:   ?Social History  ? ?Socioeconomic History  ? Marital status: Married  ?  Spouse name: [name]  ? Number of children: 1  ? Years of education: 12th grade  ? Highest education level: Not on file  ?Occupational History  ? Occupation: Retired-accountant  ?Tobacco Use  ? Smoking status: Light Smoker  ?  Packs/day: 0.10  ?  Years: 48.00  ?  Pack years: 4.80  ?  Types: Cigarettes  ?  Last attempt to quit: 07/16/2019  ?  Years since quitting: 2.3  ? Smokeless tobacco: Never  ? Tobacco comments:  ?  09/03/22 Wife states, "he hides from me, so I am not sure how much he smokes."  ?Vaping Use  ? Vaping Use: Never used  ?Substance and Sexual Activity  ? Alcohol use: No  ?  Alcohol/week: 0.0 standard drinks  ? Drug use: No  ? Sexual activity: Never  ?  Partners: Female  ?Other Topics Concern  ? Not on file  ?Social History Narrative  ? Originally from [place]. Came to the [place] in 2009, following their son who came here for school.  ? Married.  ? Lives with his wife.  ? Their adult son lives in [place], [place], where he is in optometry school.  ? Education: [employer]  ? Exercise: No  ? ?Social Determinants of Health  ? ?Financial Resource Strain: Not on file  ?Food Insecurity: Not on file  ?Transportation Needs: Not on file  ?Physical Activity: Not on file  ?Stress: Not on file  ?Social Connections: Not on file  ?Intimate Partner Violence: Not on file  ?  ?Family History:   ? ?Family History  ?Problem Relation Age of Onset  ? Hypertension Mother   ? Hyperlipidemia Mother   ? Hypertension Sister   ? Kidney disease Father   ? Heart disease Brother 18  ?     open heart  surgery  ? Colon cancer Neg Hx   ?  ? ?ROS:  ?Please see the history of present illness.  ?Fatigue, weight loss of 16 kg, nausea. ?All other ROS reviewed and negative.    ? ?Physical Exam/Data:  ? ?Vitals:  ? 11/13/21 0900 11/13/21 1100 11/13/21 1358 11/13/21 1442  ?BP: (!) 152/82 (!) 146/77  (!) 154/79  ?Pulse: 83 79  84  ?Resp: (!) 27 (!) 25  18  ?Temp:    98.4 ?F (36.9 ?C)  ?TempSrc:    Oral  ?SpO2: 98% 97%    ?Weight:   51.7 kg 51.7 kg  ?Height:    '5\' 6"'$  (1.676 m)  ? ? ?  Intake/Output Summary (Last 24 hours) at 11/13/2021 1652 ?Last data filed at 11/13/2021 0900 ?Gross per 24 hour  ?Intake --  ?Output 880 ml  ?Net -880 ml  ? ? ?  11/13/2021  ?  2:42 PM 11/13/2021  ?  1:58 PM 11/12/2021  ?  5:27 PM  ?Last 3 Weights  ?Weight (lbs) 113 lb 15.7 oz 113 lb 15.7 oz 121 lb 4.1 oz  ?Weight (kg) 51.7 kg 51.7 kg 55 kg  ?   ?Body mass index is 18.4 kg/m?.  ?General: Chronically ill-appearing, well developed, in no acute distress ?HEENT: normal ?Neck: Positive JVD ?Vascular: No carotid bruits; Distal pulses 2+ bilaterally ?Cardiac:  normal S1, S2; RRR; 3/6 systolic murmur apex. ?Lungs: Diminished breath sounds bases.  Mild rhonchi. ?Abd: soft, nontender, liver percussed to 2 cm below costal margin. ?Ext: Trace edema ?Musculoskeletal:  No deformities, BUE and BLE strength normal and equal ?Skin: warm and dry  ?Neuro:  CNs 2-12 intact, no focal abnormalities noted ?Psych:  Normal affect  ? ?EKG:  The EKG was personally reviewed and demonstrates: Normal sinus rhythm with PAC, prolonged QT interval ?Telemetry:  Telemetry was personally reviewed and demonstrates: Normal sinus rhythm ? ? ?Laboratory Data: ? ?High Sensitivity Troponin:   ?Recent Labs  ?Lab 11/12/21 ?1757 11/13/21 ?0750  ?TROPONINIHS 41* 40*  ?   ?Chemistry ?Recent Labs  ?Lab 11/09/21 ?1950 11/12/21 ?1730 11/13/21 ?0840  ?NA 136 135 137  ?K 4.1 4.3 3.6  ?CL 104 104 102  ?CO2 19* 19* 19*  ?GLUCOSE 179* 192* 189*  ?BUN 85* 87* 84*  ?CREATININE 3.36* 3.83* 3.69*  ?CALCIUM 8.9  8.8* 9.0  ?GFRNONAA 18* 16* 16*  ?ANIONGAP 13 12 16*  ?  ?Recent Labs  ?Lab 11/12/21 ?1730  ?PROT 7.3  ?ALBUMIN 3.4*  ?AST 24  ?ALT 48*  ?ALKPHOS 456*  ?BILITOT 0.7  ? ? ?Hematology ?Recent Labs  ?Lab 11/09/21 ?1950 05/[birth year]

## 2021-11-13 NOTE — Plan of Care (Signed)
  Problem: Education: Goal: Knowledge of General Education information will improve Description Including pain rating scale, medication(s)/side effects and non-pharmacologic comfort measures Outcome: Progressing   

## 2021-11-13 NOTE — ED Notes (Signed)
Pt  and wife are aware of pending discharge ?

## 2021-11-13 NOTE — ED Notes (Signed)
[facility] into transport ?Report called to [facility] ?

## 2021-11-13 NOTE — Progress Notes (Signed)
Patient speaks [place]. Doctor from cardiology came to examine patient and discuss his plan of care while here in the hospital. ?

## 2021-11-14 ENCOUNTER — Inpatient Hospital Stay (HOSPITAL_COMMUNITY): Payer: Medicare Other

## 2021-11-14 DIAGNOSIS — I5033 Acute on chronic diastolic (congestive) heart failure: Secondary | ICD-10-CM | POA: Diagnosis not present

## 2021-11-14 DIAGNOSIS — N184 Chronic kidney disease, stage 4 (severe): Secondary | ICD-10-CM

## 2021-11-14 DIAGNOSIS — E43 Unspecified severe protein-calorie malnutrition: Secondary | ICD-10-CM

## 2021-11-14 DIAGNOSIS — E079 Disorder of thyroid, unspecified: Secondary | ICD-10-CM | POA: Diagnosis not present

## 2021-11-14 DIAGNOSIS — N189 Chronic kidney disease, unspecified: Secondary | ICD-10-CM | POA: Diagnosis present

## 2021-11-14 DIAGNOSIS — E119 Type 2 diabetes mellitus without complications: Secondary | ICD-10-CM

## 2021-11-14 DIAGNOSIS — I480 Paroxysmal atrial fibrillation: Secondary | ICD-10-CM | POA: Diagnosis not present

## 2021-11-14 LAB — GLUCOSE, CAPILLARY
Glucose-Capillary: 112 mg/dL — ABNORMAL HIGH (ref 70–99)
Glucose-Capillary: 146 mg/dL — ABNORMAL HIGH (ref 70–99)
Glucose-Capillary: 204 mg/dL — ABNORMAL HIGH (ref 70–99)
Glucose-Capillary: 234 mg/dL — ABNORMAL HIGH (ref 70–99)
Glucose-Capillary: 280 mg/dL — ABNORMAL HIGH (ref 70–99)
Glucose-Capillary: 305 mg/dL — ABNORMAL HIGH (ref 70–99)

## 2021-11-14 LAB — BASIC METABOLIC PANEL
Anion gap: 14 (ref 5–15)
BUN: 82 mg/dL — ABNORMAL HIGH (ref 8–23)
CO2: 18 mmol/L — ABNORMAL LOW (ref 22–32)
Calcium: 8.9 mg/dL (ref 8.9–10.3)
Chloride: 103 mmol/L (ref 98–111)
Creatinine, Ser: 3.52 mg/dL — ABNORMAL HIGH (ref 0.61–1.24)
GFR, Estimated: 17 mL/min — ABNORMAL LOW (ref >60)
Glucose, Bld: 155 mg/dL — ABNORMAL HIGH (ref 70–99)
Potassium: 4.3 mmol/L (ref 3.5–5.1)
Sodium: 135 mmol/L (ref 135–145)

## 2021-11-14 LAB — ECHOCARDIOGRAM COMPLETE
Area-P 1/2: 4.44 cm2
Calc EF: 51.9 %
Height: 66 in
MV M vel: 5.31 m/s
MV Peak grad: 112.8 mmHg
Radius: 0.45 cm
S' Lateral: 2.8 cm
Single Plane A2C EF: 54.1 %
Single Plane A4C EF: 53.9 %
Weight: 1802.48 oz

## 2021-11-14 MED ORDER — INSULIN ASPART 100 UNIT/ML IJ SOLN
0.0000 [IU] | Freq: Three times a day (TID) | INTRAMUSCULAR | Status: DC
  Administered 2021-11-15: 2 [IU] via SUBCUTANEOUS
  Administered 2021-11-15: 1 [IU] via SUBCUTANEOUS
  Administered 2021-11-15: 3 [IU] via SUBCUTANEOUS
  Administered 2021-11-16 (×2): 5 [IU] via SUBCUTANEOUS
  Administered 2021-11-16 – 2021-11-17 (×2): 2 [IU] via SUBCUTANEOUS
  Administered 2021-11-17 (×2): 5 [IU] via SUBCUTANEOUS
  Administered 2021-11-18: 2 [IU] via SUBCUTANEOUS

## 2021-11-14 NOTE — Progress Notes (Signed)
? ?Progress Note ? ?Patient Name: Adam Reid ?Date of Encounter: 11/14/2021 ? ?[facility] Cardiologist: Elouise Munroe, MD  ? ?Subjective  ? ?History obtained with the assistance of interpreter.  Dyspnea improving; no CP ? ?Inpatient Medications  ?  ?Scheduled Meds: ? amiodarone  200 mg Oral Daily  ? apixaban  2.5 mg Oral BID  ? atorvastatin  80 mg Oral Daily  ? calcium acetate  667 mg Oral TID WC  ? citalopram  20 mg Oral Daily  ? diltiazem  240 mg Oral Daily  ? feeding supplement  237 mL Oral TID BM  ? furosemide  80 mg Intravenous BID  ? insulin aspart  0-9 Units Subcutaneous TID WC  ? linagliptin  5 mg Oral Daily  ? pantoprazole  40 mg Oral Daily  ? sodium bicarbonate  650 mg Oral BID  ? sodium chloride flush  3 mL Intravenous Q12H  ? tamsulosin  0.4 mg Oral Daily  ? ?Continuous Infusions: ? sodium chloride    ? ?PRN Meds: ?sodium chloride, acetaminophen, hydrALAZINE, ondansetron (ZOFRAN) IV, polyethylene glycol, sodium chloride flush  ? ?Vital Signs  ?  ?Vitals:  ? 11/13/21 1442 11/13/21 1918 11/14/21 0005 11/14/21 0417  ?BP: (!) 154/79 (!) 191/86 137/69 (!) 141/71  ?Pulse: 84 92 85 90  ?Resp: _0 ?Temp: 98.4 ?F (36.9 ?C) 97.6 ?F (36.4 ?C) 97.8 ?F (36.6 ?C) 97.9 ?F (36.6 ?C)  ?TempSrc: Oral Oral Oral Oral  ?SpO2: 97% 90% 93% 90%  ?Weight: 51.7 kg  51.1 kg   ?Height: _1  (1.676 m)     ? ? ?Intake/Output Summary (Last 24 hours) at 11/14/2021 0714 ?Last data filed at 11/14/2021 0600 ?Gross per 24 hour  ?Intake 680 ml  ?Output 2150 ml  ?Net -1470 ml  ? ? ?  11/14/2021  ? 12:05 AM 11/13/2021  ?  2:42 PM 11/13/2021  ?  1:58 PM  ?Last 3 Weights  ?Weight (lbs) 112 lb 10.5 oz 113 lb 15.7 oz 113 lb 15.7 oz  ?Weight (kg) 51.1 kg 51.7 kg 51.7 kg  ?   ? ?Telemetry  ?  ?Sinus - Personally Reviewed ? ?Physical Exam  ? ?GEN: No acute distress.  Frail ?Neck: Positive JVD ?Cardiac: RRR, 3/6 systolic murmur ?Respiratory: Diminished BS bases ?GI: Soft, nontender, non-distended  ?MS: No edema ?Neuro:  Nonfocal  ?Psych:  Normal affect  ? ?Labs  ?  ?High Sensitivity Troponin:   ?Recent Labs  ?Lab 11/12/21 ?1757 11/13/21 ?0750  ?TROPONINIHS 41* 40*  ?   ?Chemistry ?Recent Labs  ?Lab 11/12/21 ?1730 11/13/21 ?0840 11/14/21 ?0559  ?NA 135 137 135  ?K 4.3 3.6 4.3  ?CL 104 102 103  ?CO2 19* 19* 18*  ?GLUCOSE 192* 189* 155*  ?BUN 87* 84* 82*  ?CREATININE 3.83* 3.69* 3.52*  ?CALCIUM 8.8* 9.0 8.9  ?PROT 7.3  --   --   ?ALBUMIN 3.4*  --   --   ?AST 24  --   --   ?ALT 48*  --   --   ?ALKPHOS 456*  --   --   ?BILITOT 0.7  --   --   ?GFRNONAA 16* 16* 17*  ?ANIONGAP 12 16* 14  ?  ? ?Hematology ?Recent Labs  ?Lab 11/09/21 ?1951 11/12/21 ?1730 11/13/21 ?0750  ?WBC 13.1* 15.1* 11.7*  ?RBC 3.33* 3.54* 3.49*  ?HGB 9.0* 9.7* 9.5*  ?HCT 28.7* 30.1* 29.7*  ?MCV 86.2 85.0 85.1  ?MCH 27.0 27.4 27.2  ?MCHC  31.4 32.2 32.0  ?RDW 18.0* 18.8* 18.9*  ?PLT 358 383 353  ? ?Thyroid  ?Recent Labs  ?Lab 11/13/21 ?1430  ?TSH 4.956*  ?  ?BNP ?Recent Labs  ?Lab 11/12/21 ?[place] 11/13/21 ?0750  ?BNP 1,880.9* 1,516.2*  ?  ? ? ?Radiology  ?  ?DG Chest Port 1 View ? ?Result Date: 11/12/2021 ?CLINICAL DATA:  Weakness, fluid in lungs, increased shortness of breath EXAM: PORTABLE CHEST 1 VIEW COMPARISON:  Portable exam 1801 hours compared to 11/09/2021 FINDINGS: Enlargement of cardiac silhouette. Mediastinal contours and pulmonary vascularity normal. Atherosclerotic calcification aorta. Areas of parenchymal scarring in both lungs with persistent bibasilar pleural effusions and atelectasis. Coexistent infiltrate in lower RIGHT lung not excluded. Rounded opacity in the lower RIGHT lung on the previous exam obscured. Upper lungs clear. No pneumothorax or acute osseous findings. IMPRESSION: Persistent BILATERAL pleural effusions and basilar atelectasis as well as peripheral areas of scarring in both lungs. Cannot exclude coexistent infiltrate in the lower RIGHT lung. Enlargement of cardiac silhouette. Aortic Atherosclerosis (ICD10-I70.0). Electronically Signed   By: Lavonia Dana M.D.    On: 11/12/2021 18:14   ? ? ?Patient Profile  ?   ?76 y.o. male with past medical history of chronic diastolic congestive heart failure, stage IV kidney disease, paroxysmal atrial fibrillation, hypertension, hyperlipidemia, diabetes mellitus being assessed for acute on chronic diastolic congestive heart failure. Cardiac catheterization July 2017 showed 20% ramus, 50% second diagonal, 20% mid LAD and normal LVEDP.  PYP scan April 2021 felt likely negative; multiple myeloma panel did not demonstrate monoclonal protein spike.  Monitor May 2021 showed sinus rhythm with short (longest 6 beats) runs of nonsustained ventricular tachycardia, short runs of atrial tachycardia, PACs and PVCs.  Last echocardiogram January 2023 showed normal LV function, mild left ventricular hypertrophy, mildly elevated pulmonary pressure, mild mitral regurgitation.  Patient is status post right AV fistula placement February 2023.  Also on home oxygen.   ? ?Assessment & Plan  ?  ?Acute on chronic diastolic congestive heart failure-he is not markedly volume overloaded on examination.  However at time of admission he was noted to have elevated BNP, pleural effusions on chest x-ray and JVD.  His symptoms are improving.  We will continue Lasix at present dose today.  Note Jardiance discontinued due to severity of renal insufficiency.   ?Murmur-loud mitral regurgitation murmur on examination.  Previous echocardiogram reviewed and shows mild MR.  We will await repeat echocardiogram to reassess.  Question contribution to CHF. ?Chronic stage IV kidney disease-follow renal function closely with diuresis.  It appears that he is heading towards dialysis. ?Paroxysmal atrial fibrillation-patient remains in sinus rhythm.  Continue amiodarone and apixaban at present dose. ?Nonobstructive coronary disease-noted on previous catheterization.  Continue statin. ?Hypertension-continue Cardizem at present dose.  Follow blood pressure and adjust medications as  needed. ?Hyperlipidemia-continue statin. ? ?For questions or updates, please contact [place] ?Please consult [email or web address] for contact info under  ? ?  ?   ?Signed, ?Kirk Ruths, MD  ?11/14/2021, 7:14 AM   ? ?

## 2021-11-14 NOTE — Assessment & Plan Note (Addendum)
CKD stage 4. Hypokalemia.  ? ?Renal function with serum cr at 3,79 with K at 3,9 and serum bicarbonate at 24.  ? ?Continue diuresis with torsemide and plan to follow up renal function and electrolytes.  ? ?Anemia of chronic renal disease. Close follow up on Hgb.  ?

## 2021-11-14 NOTE — Assessment & Plan Note (Addendum)
Elevated TSH ?Normal free T4, plan to follow up thyroid function as outpatient.  ? ?

## 2021-11-14 NOTE — Progress Notes (Addendum)
?Progress Note ? ? ?Patient: Adam Reid KTG:[id number] DOB: [birth year] DOA: 11/12/2021     1 ?DOS: the patient was seen and examined on 11/14/2021 ?  ?Brief hospital course: ?Adam Reid was admitted to the hospital with the working diagnosis of decompensated heart failure.  ? ?76 yo male with the past medical history of CKD stage IV, heart failure, hypertension, chronic hypoxemia and paroxysmal atrial fibrillation who presented with dyspnea. Reported 3 to 5 days of worsening dyspnea, which was refractive to increase oral diuretic therapy at home. On his initial physical examination his blood pressure was 152/82, HR 83, RR 22, 02 saturation 98%, lungs with bilateral rales, and increase work of breathing, heart with S1 and S2 present and rhythmic, with no gallops or murmurs, no lower extremity edema.  ? ?Na 137, K 3,6 Cl 102, bicarbonate 19, glucose 189, bun 84 cr 3,69 ?BNP 1,516  ?High sensitive troponin 40  ?Wbc 11,7, hgb 9,5 plt 353  ?TSH 4,956 ? ?UA SG 1,015 with 100 protein and 100 glucose  ? ?Chest radiograph with bilateral pleural effusion, loculated on the right with bilateral interstitial infiltrates bilaterally. ? ?EKG 78 bpm, normal axis, normal intervals, sinus rhythm with no significant ST segment or T wave changes, poor R wave progression with q in V2 and sings of LVH.  ? ?Patient was placed on furosemide for diuresis.  ? ?Assessment and Plan: ?* Acute on chronic diastolic (congestive) heart failure ([place]) ?07/2021 echocardiogram with preserved LV systolic function with EF 55 to 42%, RV systolic function preserved. With no significant valvular disease.  ? ?Acute cardiogenic pulmonary edema with bilateral pleural effusion.  ? ?Urine output is documented at 2150 ml ?Blood pressure systolic 876 to 811 mmHg. ? ?Plan to continue diuresis with furosemide 80 mg IV q12 hr ?No RAS inhibition due to reduced GFR and risk of worsening renal function.  ? ?Paroxysmal atrial fibrillation ([place]) ?Continue rate control  with amiodarone and diltiazem. ?Anticoagulation with apixaban.  ? ?Chronic kidney disease ?CKD stage 4. Hypokalemia.  ? ?Renal function with serum cr at 3,52 with K at 4,3 and serum bicarbonate at 18 ?Continue to have pulmonary edema ? ?Plan to continue diuresis with furosemide and follow up renal function in am, avoid hypotension or nephrotoxic medications.  ? 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 Close follow up on Hgb.  ? ?Thyroid disease ?Elevated TSH ?Check free T4 and free T3  ? ? ?HTN (hypertension) ?Continue blood pressure monitoring ?Continue diuresis with IV furosemide.  ? ?Diabetes mellitus without complication ([place]) ?Fasting glucose this am is 155 ?Continue capillary glucose monitoring.  ?On insulin sliding scale and linagliptin.  ? ?Dyslipidemia, continue with statin therapy.  ? ?Protein-calorie malnutrition, severe ?Continue with nutritional supplements.  ? ? ? ? ?  ? ?Subjective: Patient continue to have dyspnea, no chest pain  ? ?Physical Exam: ?Vitals:  ? 11/14/21 0005 11/14/21 0417 11/14/21 0716 11/14/21 1118  ?BP: 137/69 (!) 141/71 (!) 146/76 (!) 156/80  ?Pulse: 85 90 81 89  ?Resp:  '20 18 17  '$ ?Temp: 97.8 ?F (36.6 ?C) 97.9 ?F (36.6 ?C) 97.6 ?F (36.4 ?C) 97.8 ?F (36.6 ?C)  ?TempSrc: Oral Oral Oral Oral  ?SpO2: 93% 90% 100% 95%  ?Weight: 51.1 kg     ?Height:      ? ?Neurology awake and alert ?ENT with no pallor ?Cardiovascular with S1 and S2 present and rhythmic with no gallops, rubs, positive systolic murmur at the right sternal border  ?  Mild JVD ?No lower extremity edema  ?Lungs with no wheezing but bilateral rales and rhonchi ?Abdomen not distended  ?Data Reviewed: ? ? ? ?Family Communication: no family at the bedside. Phone interpreter used  ? ?Disposition: ?Status is: Inpatient ?Remains inpatient appropriate because: heart failure on IV furosemide  ? Planned Discharge Destination: Home ? ? ? ? ?Author: ?Tawni Millers, MD ?11/14/2021 11:40  AM ? ?For on call review [email or web address].  ?

## 2021-11-14 NOTE — Assessment & Plan Note (Addendum)
Uncontrolled hyperglycemia  ? ?Fasting glucose this am is 256 mg/dl ? ?Continue with insulin sliding scale for glucose cover and monitoring.  ?Patient is tolerating po well.  ?Continue with linagliptin.  ? ?Dyslipidemia, continue with statin therapy.  ?

## 2021-11-14 NOTE — Assessment & Plan Note (Signed)
Continue with nutritional supplements.  

## 2021-11-14 NOTE — Evaluation (Signed)
Physical Therapy Evaluation ?Patient Details ?Name: Adam Reid ?MRN: [id number] ?DOB: 1945-09-25 ?Today's Date: 11/14/2021 ? ?History of Present Illness ? 76 yo male with onset of SOB and worsening symptoms was admitted and found decompensated CHF on 5/4.  Pt had PAF with good rate control now, as well as management of DM and HTN, and diuresis.  PMHx:  malnutrition, thyroid disease, DM, CKD,  ?Clinical Impression ? Pt was seen for mobility to evaluate his tolerances and monitor his reaction to moving.  Has some loss of strength from mobility changes, and has limited endurance but is not requiring much to move in support.  No AD needed, and will recommend HHPT for following him to establish a home strengthening routine along with safety ck.  He is not necessarily going to use HHPT for follow up but has agreed to think about it.  Will follow up for acute PT goals as outlined below.  All services and questions were provided with translation with audio feed.   ?   ? ?Recommendations for follow up therapy are one component of a multi-disciplinary discharge planning process, led by the attending physician.  Recommendations may be updated based on patient status, additional functional criteria and insurance authorization. ? ?Follow Up Recommendations Home health PT ? ?  ?Assistance Recommended at Discharge Intermittent Supervision/Assistance  ?Patient can return home with the following ? A little help with bathing/dressing/bathroom;Assistance with cooking/housework;Assist for transportation ? ?  ?Equipment Recommendations None recommended by PT  ?Recommendations for Other Services ?    ?  ?Functional Status Assessment Patient has had a recent decline in their functional status and demonstrates the ability to make significant improvements in function in a reasonable and predictable amount of time.  ? ?  ?Precautions / Restrictions Precautions ?Precautions: None ?Precaution Comments: observe HR and  sats ?Restrictions ?Weight Bearing Restrictions: No  ? ?  ? ?Mobility ? Bed Mobility ?Overal bed mobility: Modified Independent ?  ?  ?  ?  ?  ?  ?  ?  ? ?Transfers ?Overall transfer level: Modified independent ?Equipment used: None ?  ?  ?  ?  ?  ?  ?  ?  ?  ? ?Ambulation/Gait ?Ambulation/Gait assistance: Supervision ?Gait Distance (Feet): 40 Feet ?Assistive device: None ?Gait Pattern/deviations: Step-through pattern, Decreased stride length ?Gait velocity: reduced ?Gait velocity interpretation: <1.31 ft/sec, indicative of household ambulator ?Pre-gait activities: standing balance ck ?General Gait Details: pt is walking with no AD, able to manage with no O2 used ? ?Stairs ?  ?  ?  ?  ?  ? ?Wheelchair Mobility ?  ? ?Modified Rankin (Stroke Patients Only) ?  ? ?  ? ?Balance Overall balance assessment: No apparent balance deficits (not formally assessed) ?  ?  ?  ?  ?  ?  ?  ?  ?  ?  ?  ?  ?  ?  ?  ?  ?  ?  ?   ? ? ? ?Pertinent Vitals/Pain Pain Assessment ?Pain Assessment: No/denies pain  ? ? ?Home Living Family/patient expects to be discharged to:: Private residence ?Living Arrangements: Spouse/significant other ?Available Help at Discharge: Family;Available 24 hours/day ?Type of Home: House ?Home Access: Level entry ?  ?  ?  ?Home Layout: One level ?Home Equipment: None ?Additional Comments: has not needed an assistive device  ?  ?Prior Function Prior Level of Function : Independent/Modified Independent ?  ?  ?  ?  ?  ?  ?Mobility Comments: no AD needed,  owns no equipment ?  ?  ? ? ?Hand Dominance  ? Dominant Hand: Right ? ?  ?Extremity/Trunk Assessment  ? Upper Extremity Assessment ?Upper Extremity Assessment: Overall WFL for tasks assessed ?  ? ?Lower Extremity Assessment ?Lower Extremity Assessment: Generalized weakness ?  ? ?Cervical / Trunk Assessment ?Cervical / Trunk Assessment: Normal  ?Communication  ? Communication: Prefers language other than [place];Other (comment) (interpreter used for [place])   ?Cognition Arousal/Alertness: Awake/alert ?Behavior During Therapy: [facility] for tasks assessed/performed ?Overall Cognitive Status: Within Functional Limits for tasks assessed ?  ?  ?  ?  ?  ?  ?  ?  ?  ?  ?  ?  ?  ?  ?  ?  ?  ?  ?  ? ?  ?General Comments General comments (skin integrity, edema, etc.): pt was observed to walk with no O2, able to maneuver with no drops in sats and HR was in 80-95 beat range. ? ?  ?Exercises    ? ?Assessment/Plan  ?  ?PT Assessment Patient needs continued PT services  ?PT Problem List Decreased strength;Decreased activity tolerance;Cardiopulmonary status limiting activity ? ?   ?  ?PT Treatment Interventions DME instruction;Gait training;Functional mobility training;Therapeutic activities;Therapeutic exercise;Balance training;Neuromuscular re-education;Patient/family education   ? ?PT Goals (Current goals can be found in the Care Plan section)  ?Acute Rehab PT Goals ?Patient Stated Goal: to go home again soon ?PT Goal Formulation: With patient ?Time For Goal Achievement: 11/21/21 ?Potential to Achieve Goals: Good ? ?  ?Frequency Min 3X/week ?  ? ? ?Co-evaluation   ?  ?  ?  ?  ? ? ?  ?AM-PAC PT "6 Clicks" Mobility  ?Outcome Measure Help needed turning from your back to your side while in a flat bed without using bedrails?: None ?Help needed moving from lying on your back to sitting on the side of a flat bed without using bedrails?: A Little ?Help needed moving to and from a bed to a chair (including a wheelchair)?: A Little ?Help needed standing up from a chair using your arms (e.g., wheelchair or bedside chair)?: A Little ?Help needed to walk in hospital room?: A Little ?Help needed climbing 3-5 steps with a railing? : A Little ?6 Click Score: 19 ? ?  ?End of Session Equipment Utilized During Treatment: Gait belt ?Activity Tolerance: Patient tolerated treatment well ?Patient left: in bed;with call bell/phone within reach;with bed alarm set ?Nurse Communication: Mobility status ?PT Visit  Diagnosis: Muscle weakness (generalized) (M62.81) ?  ? ?Time: 2633-3545 ?PT Time Calculation (min) (ACUTE ONLY): 24 min ? ? ?Charges:   PT Evaluation ?$PT Eval Moderate Complexity: 1 Mod ?PT Treatments ?$Gait Training: 8-22 mins ?  ?   ? ?Ramond Dial ?11/14/2021, 12:45 PM ? ?Mee Hives, PT PhD ?Acute Rehab Dept. Number: [facility] [phone] and [place] [phone] ? ? ?

## 2021-11-14 NOTE — Assessment & Plan Note (Addendum)
07/2021 echocardiogram with preserved LV systolic function with EF 55 to 92%, RV systolic function preserved. With no significant valvular disease.  ? ?Acute cardiogenic pulmonary edema with bilateral pleural effusion.  ? ?Urine output is documented at 1,700 ml ?Blood pressure systolic 446 to 286 mmHg. ? ?No RAS inhibition due to reduced GFR and risk of worsening renal function.  ?Patient has been transitioned to torsemide to keep negative fluid balance.  ?

## 2021-11-14 NOTE — Assessment & Plan Note (Addendum)
Blood pressure has been stable, continue diuresis with torsemide.  ?

## 2021-11-14 NOTE — Hospital Course (Addendum)
Adam Reid was admitted to the hospital with the working diagnosis of decompensated heart failure in the setting of right lower lobe pneumonia. ?New left upper abdominal mass.   ? ?76 yo male with the past medical history of CKD stage IV, heart failure, hypertension, chronic hypoxemia and paroxysmal atrial fibrillation who presented with dyspnea. Reported 3 to 5 days of worsening dyspnea, which was refractive to increase oral diuretic therapy at home. On his initial physical examination his blood pressure was 152/82, HR 83, RR 22, 02 saturation 98%, lungs with bilateral rales, and increase work of breathing, heart with S1 and S2 present and rhythmic, with no gallops or murmurs, no lower extremity edema.  ? ?Na 137, K 3,6 Cl 102, bicarbonate 19, glucose 189, bun 84 cr 3,69 ?BNP 1,516  ?High sensitive troponin 40  ?Wbc 11,7, hgb 9,5 plt 353  ?TSH 4,956 ? ?UA SG 1,015 with 100 protein and 100 glucose  ? ?Chest radiograph with bilateral pleural effusion, loculated on the right with bilateral interstitial infiltrates bilaterally. ? ?EKG 78 bpm, normal axis, normal intervals, sinus rhythm with no significant ST segment or T wave changes, poor R wave progression with q in V2 and sings of LVH.  ? ?Patient was placed on furosemide for diuresis with good toleration.  ?Further work up with chest CT positive for right lower lobe infiltrate.  ?05/08 started on IV ceftriaxone for community acquired pneumonia, present on admission.   ?Left upper abdomen mass confirmed with [place] ?Plan for CT abdomen and pelvis for further work up.  ?

## 2021-11-14 NOTE — Assessment & Plan Note (Addendum)
On amiodarone and diltiazem. ?Anticoagulation with apixaban.  ?

## 2021-11-14 NOTE — Progress Notes (Signed)
?  Echocardiogram ?2D Echocardiogram has been performed. ? ?Adam Reid ?11/14/2021, 2:35 PM ?

## 2021-11-15 ENCOUNTER — Other Ambulatory Visit: Payer: Self-pay | Admitting: Family Medicine

## 2021-11-15 ENCOUNTER — Inpatient Hospital Stay (HOSPITAL_COMMUNITY): Payer: Medicare Other

## 2021-11-15 DIAGNOSIS — N184 Chronic kidney disease, stage 4 (severe): Secondary | ICD-10-CM | POA: Diagnosis not present

## 2021-11-15 DIAGNOSIS — I480 Paroxysmal atrial fibrillation: Secondary | ICD-10-CM | POA: Diagnosis not present

## 2021-11-15 DIAGNOSIS — I10 Essential (primary) hypertension: Secondary | ICD-10-CM | POA: Diagnosis not present

## 2021-11-15 DIAGNOSIS — I5033 Acute on chronic diastolic (congestive) heart failure: Secondary | ICD-10-CM | POA: Diagnosis not present

## 2021-11-15 LAB — GLUCOSE, CAPILLARY
Glucose-Capillary: 106 mg/dL — ABNORMAL HIGH (ref 70–99)
Glucose-Capillary: 244 mg/dL — ABNORMAL HIGH (ref 70–99)
Glucose-Capillary: 186 mg/dL — ABNORMAL HIGH (ref 70–99)
Glucose-Capillary: 137 mg/dL — ABNORMAL HIGH (ref 70–99)

## 2021-11-15 LAB — BASIC METABOLIC PANEL
Anion gap: 12 (ref 5–15)
BUN: 87 mg/dL — ABNORMAL HIGH (ref 8–23)
CO2: 22 mmol/L (ref 22–32)
Calcium: 9.5 mg/dL (ref 8.9–10.3)
Chloride: 103 mmol/L (ref 98–111)
Creatinine, Ser: 3.42 mg/dL — ABNORMAL HIGH (ref 0.61–1.24)
GFR, Estimated: 18 mL/min — ABNORMAL LOW (ref >60)
Glucose, Bld: 106 mg/dL — ABNORMAL HIGH (ref 70–99)
Potassium: 4.3 mmol/L (ref 3.5–5.1)
Sodium: 137 mmol/L (ref 135–145)

## 2021-11-15 MED ORDER — INSULIN ASPART 100 UNIT/ML IJ SOLN
3.0000 [IU] | Freq: Once | INTRAMUSCULAR | Status: AC
  Administered 2021-11-15: 3 [IU] via SUBCUTANEOUS

## 2021-11-15 NOTE — Progress Notes (Signed)
?Progress Note ? ? ?Patient: Adam Reid UXL:[id number] DOB: Aug 21, 1945 DOA: 11/12/2021     2 ?DOS: the patient was seen and examined on 11/15/2021 ?  ?Brief hospital course: ?Mr. Plasencia was admitted to the hospital with the working diagnosis of decompensated heart failure.  ? ?76 yo male with the past medical history of CKD stage IV, heart failure, hypertension, chronic hypoxemia and paroxysmal atrial fibrillation who presented with dyspnea. Reported 3 to 5 days of worsening dyspnea, which was refractive to increase oral diuretic therapy at home. On his initial physical examination his blood pressure was 152/82, HR 83, RR 22, 02 saturation 98%, lungs with bilateral rales, and increase work of breathing, heart with S1 and S2 present and rhythmic, with no gallops or murmurs, no lower extremity edema.  ? ?Na 137, K 3,6 Cl 102, bicarbonate 19, glucose 189, bun 84 cr 3,69 ?BNP 1,516  ?High sensitive troponin 40  ?Wbc 11,7, hgb 9,5 plt 353  ?TSH 4,956 ? ?UA SG 1,015 with 100 protein and 100 glucose  ? ?Chest radiograph with bilateral pleural effusion, loculated on the right with bilateral interstitial infiltrates bilaterally. ? ?EKG 78 bpm, normal axis, normal intervals, sinus rhythm with no significant ST segment or T wave changes, poor R wave progression with q in V2 and sings of LVH.  ? ?Patient was placed on furosemide for diuresis with good toleration.  ?Plan for Ct chest non contrast for further work up.  ? ?Assessment and Plan: ?* Acute on chronic diastolic (congestive) heart failure ([place]) ?07/2021 echocardiogram with preserved LV systolic function with EF 55 to 53%, RV systolic function preserved. With no significant valvular disease.  ? ?Acute cardiogenic pulmonary edema with bilateral pleural effusion.  ? ?Urine output is documented at 1,375 ml ?Blood pressure systolic 664 to 403 mmHg. ? ?No RAS inhibition due to reduced GFR and risk of worsening renal function.  ?Continue diuresis with furosemide.  ?Volume  status improving, but patient continue to have dyspnea, plan to get non contrast CT chest to further evaluate loculated pleural effusions.  ? ?Paroxysmal atrial fibrillation ([place]) ?On amiodarone and diltiazem. ?Anticoagulation with apixaban.  ? ?Chronic kidney disease ?CKD stage 4. Hypokalemia.  ? ?Improvement in volume status.  ? ?Serum cr at 3,42 with K at 4,3 and serum bicarbonate at 22 ? ?On furosemide for diuresis.  ? 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 Close follow up on Hgb.  ? ?Thyroid disease ?Elevated TSH ?Check free T4 and free T3  ? ? ?HTN (hypertension) ?Continue blood pressure monitoring ?Tolerating well diuresis with IV furosemide.  ? ?Diabetes mellitus without complication ([place]) ?Uncontrolled hyperglycemia with capillary glucose at 305 mg/dl ?Continue capillary glucose monitoring. \ ?Insulin sliding scale for glucose cover and monitoring.  ?.  ?Continue with linagliptin.  ? ?Dyslipidemia, continue with statin therapy.  ? ?Protein-calorie malnutrition, severe ?Continue with nutritional supplements.  ? ? ? ? ?  ? ?Subjective: Patient is feeling better but continue to have dyspnea, no chest pain,  ? ?Physical Exam: ?Vitals:  ? 11/14/21 1118 11/14/21 1610 11/14/21 2032 11/15/21 0455  ?BP: (!) 156/80 127/61 (!) 144/67 139/72  ?Pulse: 89 76 79 80  ?Resp: '17 18 17   '$ ?Temp: 97.8 ?F (36.6 ?C) 97.8 ?F (36.6 ?C) 98.2 ?F (36.8 ?C) 98.2 ?F (36.8 ?C)  ?TempSrc: Oral Oral Oral Oral  ?SpO2: 95% 94% 93% 90%  ?Weight:    50.6 kg  ?Height:      ? ?Neurology awake and  alert ?ENT with mild pallor ?Cardiovascular with S1 and S2 present and rhythmic with no gallops, positive systolic murmur at the right sternal border, no rubs ?No JVD ?No lower extremity edema ?Respiratory with decreased breath sounds at bases with bilateral rales ?Abdomen not distended  ?Data Reviewed: ? ? ? ?Family Communication: no family at the bedside  ? ?Disposition: ?Status is: Inpatient ?Remains  inpatient appropriate because: heart failure on IV furosemide  ? Planned Discharge Destination: Home ? ?Author: ?Tawni Millers, MD ?11/15/2021 11:45 AM ? ?For on call review [email or web address]  ?

## 2021-11-15 NOTE — Progress Notes (Signed)
? ?Progress Note ? ?Patient Name: Adam Reid ?Date of Encounter: 11/15/2021 ? ?[facility] Cardiologist: Elouise Munroe, MD  ? ?Subjective  ? ?History obtained with the assistance of interpreter.  Patient states his dyspnea continues to improve.  He denies chest pain.  He is complaining of rib pain. ? ?Inpatient Medications  ?  ?Scheduled Meds: ? amiodarone  200 mg Oral Daily  ? apixaban  2.5 mg Oral BID  ? atorvastatin  80 mg Oral Daily  ? calcium acetate  667 mg Oral TID WC  ? citalopram  20 mg Oral Daily  ? diltiazem  240 mg Oral Daily  ? feeding supplement  237 mL Oral TID BM  ? furosemide  80 mg Intravenous BID  ? insulin aspart  0-9 Units Subcutaneous TID AC & HS  ? linagliptin  5 mg Oral Daily  ? pantoprazole  40 mg Oral Daily  ? sodium bicarbonate  650 mg Oral BID  ? sodium chloride flush  3 mL Intravenous Q12H  ? tamsulosin  0.4 mg Oral Daily  ? ?Continuous Infusions: ? sodium chloride    ? ?PRN Meds: ?sodium chloride, acetaminophen, hydrALAZINE, ondansetron (ZOFRAN) IV, polyethylene glycol, sodium chloride flush  ? ?Vital Signs  ?  ?Vitals:  ? 11/14/21 1118 11/14/21 1610 11/14/21 2032 11/15/21 0455  ?BP: (!) 156/80 127/61 (!) 144/67 139/72  ?Pulse: 89 76 79 80  ?Resp: '17 18 17   '$ ?Temp: 97.8 ?F (36.6 ?C) 97.8 ?F (36.6 ?C) 98.2 ?F (36.8 ?C) 98.2 ?F (36.8 ?C)  ?TempSrc: Oral Oral Oral Oral  ?SpO2: 95% 94% 93% 90%  ?Weight:    50.6 kg  ?Height:      ? ? ?Intake/Output Summary (Last 24 hours) at 11/15/2021 0859 ?Last data filed at 11/15/2021 5462 ?Gross per 24 hour  ?Intake 820 ml  ?Output 1575 ml  ?Net -755 ml  ? ? ? ?  11/15/2021  ?  4:55 AM 11/14/2021  ? 12:05 AM 11/13/2021  ?  2:42 PM  ?Last 3 Weights  ?Weight (lbs) 111 lb 9.6 oz 112 lb 10.5 oz 113 lb 15.7 oz  ?Weight (kg) 50.621 kg 51.1 kg 51.7 kg  ?   ? ?Telemetry  ?  ?Sinus - Personally Reviewed ? ?Physical Exam  ? ?GEN: NAD ?Neck: Supple ?Cardiac: RRR, 2/6 systolic murmur ?Respiratory: Diminished BS bases; no wheeze ?GI: Soft, NT/ND ?MS: No  edema ?Neuro: Grossly intact ?Psych: Normal affect  ? ?Labs  ?  ?High Sensitivity Troponin:   ?Recent Labs  ?Lab 11/12/21 ?1757 11/13/21 ?0750  ?TROPONINIHS 41* 40*  ? ?   ?Chemistry ?Recent Labs  ?Lab 11/12/21 ?1730 11/13/21 ?0840 11/14/21 ?0559 11/15/21 ?7035  ?NA 135 137 135 137  ?K 4.3 3.6 4.3 4.3  ?CL 104 102 103 103  ?CO2 19* 19* 18* 22  ?GLUCOSE 192* 189* 155* 106*  ?BUN 87* 84* 82* 87*  ?CREATININE 3.83* 3.69* 3.52* 3.42*  ?CALCIUM 8.8* 9.0 8.9 9.5  ?PROT 7.3  --   --   --   ?ALBUMIN 3.4*  --   --   --   ?AST 24  --   --   --   ?ALT 48*  --   --   --   ?ALKPHOS 456*  --   --   --   ?BILITOT 0.7  --   --   --   ?GFRNONAA 16* 16* 17* 18*  ?ANIONGAP 12 16* 14 12  ? ?  ? ?Hematology ?Recent Labs  ?  Lab 11/09/21 ?1951 11/12/21 ?1730 11/13/21 ?0750  ?WBC 13.1* 15.1* 11.7*  ?RBC 3.33* 3.54* 3.49*  ?HGB 9.0* 9.7* 9.5*  ?HCT 28.7* 30.1* 29.7*  ?MCV 86.2 85.0 85.1  ?MCH 27.0 27.4 27.2  ?MCHC 31.4 32.2 32.0  ?RDW 18.0* 18.8* 18.9*  ?PLT 358 383 353  ? ? ?Thyroid  ?Recent Labs  ?Lab 11/13/21 ?1430  ?TSH 4.956*  ? ?  ?BNP ?Recent Labs  ?Lab 11/12/21 ?[place] 11/13/21 ?0750  ?BNP 1,880.9* 1,516.2*  ? ?  ? ? ?Radiology  ?  ?ECHOCARDIOGRAM COMPLETE ? ?Result Date: 11/14/2021 ?   ECHOCARDIOGRAM REPORT   Patient Name:   Adam Reid Date of Exam: 11/14/2021 Medical Rec #:  [id number]        Height:       66.0 in Accession #:    [id number]       Weight:       112.7 lb Date of Birth:  07/08/1946        BSA:          1.567 m? Patient Age:    76 years         BP:           156/80 mmHg Patient Gender: M                HR:           76 bpm. 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 Abnormal ECG,  Mitral Valve Disease,                 Arrythmias:Atrial Fibrillation; Risk Factors:Hypertension,                 Diabetes and Current Smoker.  Sonographer:    Roseanna Rainbow RDCS Referring Phys: [place]  1. Left ventricular ejection fraction, by estimation, is 55 to 60%. The left ventricle has normal function. The left ventricle has no regional wall motion abnormalities. There is mild left ventricular hypertrophy of the basal-septal segment. Left ventricular diastolic parameters are consistent with Grade III diastolic dysfunction (restrictive). Elevated left atrial pressure.  2. Right ventricular systolic function is normal. The right ventricular size is mildly enlarged. There is mildly elevated pulmonary artery systolic pressure.  3. Left atrial size was moderately dilated.  4. 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tion. No evidence of mitral stenosis.  5. The aortic valve is tricuspid. Aortic valve regurgitation is not visualized. Aortic valve sclerosis is present, with no evidence of aortic valve stenosis.  6. The inferior vena cava is dilated in size with >50% respiratory variability, suggesting right atrial pressure of 8 mmHg. FINDINGS  Left Ventricle: Left ventricular ejection fraction, by estimation, is 55 to 60%. The left ventricle has normal function. The left ventricle has no regional wall motion abnormalities. The left ventricular internal cavity size was normal in size. There is  mild left ventricular hypertrophy of the basal-septal segment. Left ventricular  diastolic parameters are consistent with Grade III diastolic dysfunction (restrictive). Elevated left atrial pressure. Right Ventricle: The right ventricular size is mildly enlarged. Right ventricular systolic function is normal. There is mildly elevated pulmonary artery systolic pressure. The tricuspid regurgitant velocity is 2.74 m/s, and with an assumed right atrial pressure of 8 mmHg, the estimated  right ventricular systolic pressure is 71.2 mmHg. Left Atrium: Left atrial size was moderately dilated. Right Atrium: Right atrial size was normal in size. Pericardium: There is no evidence of pericardial effusion. Mitral Valve: 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tion. No evidence of mitral valve stenosis. Tricuspid Valve: The tricuspid valve is normal in structure. Tricuspid valve regurgitation is mild . No evidence of tricuspid stenosis. Aortic Valve: The aortic valve is tricuspid. Aortic valve regurgitation is not visualized. Aortic valve sclerosis is present, with no evidence of aortic valve stenosis. Pulmonic Valve: The pulmonic valve was normal in structure. Pulmonic valve regurgitation is mild. No evidence of pulmonic stenosis. Aorta: The aortic root is normal in size and structure. Venous: The inferior vena cava is dilated in size with greater than 50% respiratory variability, suggesting right atrial pressure of 8 mmHg. IAS/Shunts: No atrial level shunt detected by color flow Doppler.  LEFT VENTRICLE PLAX 2D LVIDd:         4.40 cm      Diastology LVIDs:         2.80 cm      LV e' medial:    5.40 cm/s LV PW:         1.00 cm      LV E/e' medial:  23.1 LV IVS:        1.30 cm      LV e' lateral:   9.15 cm/s LVOT diam:     1.90 cm      LV E/e' lateral: 13.6 LV SV:         60 LV SV Index:   39 LVOT Area:     2.84 cm?                              3D Volume EF: LV Volumes (MOD)            3D EF:        55 % LV vol d, MOD A2C: 125.5 ml LV EDV:       164 ml LV vol d, MOD A4C: 123.0 ml LV ESV:       74 ml LV vol s, MOD A2C: 57.6 ml  LV SV:        91 ml LV vol s, MOD A4C: 56.7 ml LV SV MOD A2C:     67.9 ml LV SV MOD A4C:     123.0 ml LV SV MOD BP:      67.5 ml RIGHT VENTRICLE             IVC RV S prime:     14.20 cm/s  IVC diam: 2.40 cm TAPSE (M-mode): 1.6 cm LEFT ATRIUM             Index        RIGHT ATRIUM           Index LA diam:        4.60 cm 2.94 cm/m?   RA Area:     17.40 cm? LA Vol  (A2C):   40.0 ml 25.53 ml/m?  RA Volume:   51.40  ml  32.80 ml/m? LA Vol (A4C):   63.0 ml 40.21 ml/m? LA Biplane Vol: 51.3 ml 32.74 ml/m?  AORTIC VALVE             PULMONIC VALVE LVOT Vmax:   119.00 cm/s PR End Willette Brace

## 2021-11-15 NOTE — Plan of Care (Signed)
?  Problem: Skin Integrity: ?Goal: Risk for impaired skin integrity will decrease ?Outcome: Completed/Met ?  ?Problem: Safety: ?Goal: Ability to remain free from injury will improve ?Outcome: Completed/Met ?  ?Problem: Pain Managment: ?Goal: General experience of comfort will improve ?Outcome: Completed/Met ?  ?Problem: Elimination: ?Goal: Will not experience complications related to urinary retention ?Outcome: Completed/Met ?  ?Problem: Elimination: ?Goal: Will not experience complications related to bowel motility ?Outcome: Completed/Met ?  ?Problem: Coping: ?Goal: Level of anxiety will decrease ?Outcome: Completed/Met ?  ?Problem: Nutrition: ?Goal: Adequate nutrition will be maintained ?Outcome: Completed/Met ?  ?Problem: Activity: ?Goal: Risk for activity intolerance will decrease ?Outcome: Completed/Met ?  ?Problem: Clinical Measurements: ?Goal: Respiratory complications will improve ?Outcome: Completed/Met ?  ?Problem: Clinical Measurements: ?Goal: Will remain free from infection ?Outcome: Completed/Met ?  ?

## 2021-11-16 ENCOUNTER — Inpatient Hospital Stay (HOSPITAL_COMMUNITY): Payer: Medicare Other

## 2021-11-16 DIAGNOSIS — I5033 Acute on chronic diastolic (congestive) heart failure: Secondary | ICD-10-CM | POA: Diagnosis not present

## 2021-11-16 DIAGNOSIS — J189 Pneumonia, unspecified organism: Secondary | ICD-10-CM | POA: Diagnosis not present

## 2021-11-16 DIAGNOSIS — I480 Paroxysmal atrial fibrillation: Secondary | ICD-10-CM | POA: Diagnosis not present

## 2021-11-16 DIAGNOSIS — E119 Type 2 diabetes mellitus without complications: Secondary | ICD-10-CM | POA: Diagnosis not present

## 2021-11-16 DIAGNOSIS — N184 Chronic kidney disease, stage 4 (severe): Secondary | ICD-10-CM | POA: Diagnosis not present

## 2021-11-16 LAB — RENAL FUNCTION PANEL
Albumin: 2.9 g/dL — ABNORMAL LOW (ref 3.5–5.0)
Anion gap: 18 — ABNORMAL HIGH (ref 5–15)
BUN: 92 mg/dL — ABNORMAL HIGH (ref 8–23)
CO2: 16 mmol/L — ABNORMAL LOW (ref 22–32)
Calcium: 9 mg/dL (ref 8.9–10.3)
Chloride: 101 mmol/L (ref 98–111)
Creatinine, Ser: 3.42 mg/dL — ABNORMAL HIGH (ref 0.61–1.24)
GFR, Estimated: 18 mL/min — ABNORMAL LOW (ref >60)
Glucose, Bld: 126 mg/dL — ABNORMAL HIGH (ref 70–99)
Phosphorus: 5.3 mg/dL — ABNORMAL HIGH (ref 2.5–4.6)
Potassium: 3.9 mmol/L (ref 3.5–5.1)
Sodium: 135 mmol/L (ref 135–145)

## 2021-11-16 LAB — GLUCOSE, CAPILLARY
Glucose-Capillary: 175 mg/dL — ABNORMAL HIGH (ref 70–99)
Glucose-Capillary: 254 mg/dL — ABNORMAL HIGH (ref 70–99)
Glucose-Capillary: 71 mg/dL (ref 70–99)
Glucose-Capillary: 268 mg/dL — ABNORMAL HIGH (ref 70–99)

## 2021-11-16 LAB — T4, FREE: Free T4: 1 ng/dL (ref 0.61–1.12)

## 2021-11-16 MED ORDER — SODIUM CHLORIDE 0.9 % IV SOLN
1.0000 g | INTRAVENOUS | Status: DC
  Administered 2021-11-16 – 2021-11-20 (×5): 1 g via INTRAVENOUS
  Filled 2021-11-16 (×5): qty 10

## 2021-11-16 MED ORDER — CALCIUM ACETATE (PHOS BINDER) 667 MG PO CAPS: 667.0000 mg | ORAL_CAPSULE | Freq: Three times a day (TID) | ORAL | Status: DC

## 2021-11-16 MED ORDER — AZITHROMYCIN 250 MG PO TABS
500.0000 mg | ORAL_TABLET | Freq: Every day | ORAL | Status: AC
  Administered 2021-11-16 – 2021-11-20 (×5): 500 mg via ORAL
  Filled 2021-11-16 (×5): qty 2

## 2021-11-16 NOTE — TOC Initial Note (Signed)
Transition of Care (TOC) - Initial/Assessment Note  ? ? ?Patient Details  ?Name: Adam Reid ?MRN: [id number] ?Date of Birth: 06-10-1946 ? ?Transition of Care (TOC) CM/SW Contact:    ?Zenon Mayo, RN ?Phone Number: ?11/16/2021, 3:17 PM ? ?Clinical Narrative:                 ? from home with wife, indep, CHF, LV duysfunction, conts on IV lasix 80 bid, understands simple English, monitoring kidney function, has fistula in R arm, not using fistula yet, cret is 3.6 to 3.4, phosphorus elevated, on eliquis, per pt eval rec HHPT. Wife in the room, NCM offered choice to wife who speaks English she asked patient, and he states he does not want [facility] services, he is very indep.  TOC will continue to follow for dc needs.  ? ?Expected Discharge Plan: [place] ?Barriers to Discharge: Continued Medical Work up ? ? ?Patient Goals and CMS Choice ?Patient states their goals for this hospitalization and ongoing recovery are:: return home ?CMS Medicare.gov Compare Post Acute Care list provided to:: Patient ?Choice offered to / list presented to : Patient ? ?Expected Discharge Plan and Services ?Expected Discharge Plan: [place] ?In-house Referral: NA ?Discharge Planning Services: CM Consult ?Post Acute Care Choice: Home Health ?Living arrangements for the past 2 months: [place] ?                ?  ?DME Agency: NA ?  ?  ?  ?HH Arranged: PT ?  ?  ?  ?  ? ?Prior Living Arrangements/Services ?Living arrangements for the past 2 months: [place] ?Lives with:: Spouse ?Patient language and need for interpreter reviewed:: Yes ?Do you feel safe going back to the place where you live?: Yes      ?Need for Family Participation in Patient Care: Yes (Comment) ?Care giver support system in place?: Yes (comment) ?  ?Criminal Activity/Legal Involvement Pertinent to Current Situation/Hospitalization: No - Comment as needed ? ?Activities of Daily Living ?Home Assistive  Devices/Equipment: None ?ADL Screening (condition at time of admission) ?Patient's cognitive ability adequate to safely complete daily activities?: Yes ?Is the patient deaf or have difficulty hearing?: No ?Does the patient have difficulty seeing, even when wearing glasses/contacts?: No ?Does the patient have difficulty concentrating, remembering, or making decisions?: No ?Patient able to express need for assistance with ADLs?: Yes ?Does the patient have difficulty dressing or bathing?: No ?Independently performs ADLs?: Yes (appropriate for developmental age) ?Does the patient have difficulty walking or climbing stairs?: No ?Weakness of Legs: None ?Weakness of Arms/Hands: None ? ?Permission Sought/Granted ?  ?  ?   ?   ?   ?   ? ?Emotional Assessment ?Appearance:: Appears stated age ?Attitude/Demeanor/Rapport:  (appropriate) ?Affect (typically observed): Appropriate ?Orientation: : Oriented to Self, Oriented to Place, Oriented to  Time, Oriented to Situation ?Alcohol / Substance Use: Not Applicable ?Psych Involvement: No (comment) ? ?Admission diagnosis:  CHF with left ventricular diastolic dysfunction, NYHA class 4 ([place]) [I50.30] ?Acute on chronic diastolic (congestive) heart failure ([place]) [I50.33] ?CHF (congestive heart failure) ([place]) [I50.9] ?Patient Active Problem List  ? Diagnosis Date Noted  ? CAP (community acquired pneumonia) 11/16/2021  ? Thyroid disease   ? Diabetes mellitus without complication ([place])   ? Chronic kidney disease   ? Paroxysmal atrial fibrillation (HCC)   ? Protein-calorie malnutrition, severe 11/13/2021  ? Acute on chronic diastolic (congestive) heart failure ([place]) 11/12/2021  ? Iron  deficiency anemia 09/16/2021  ? Acute on chronic diastolic CHF (congestive heart failure) ([place]) 09/14/2021  ? COVID-19 virus infection 09/14/2021  ? Acute kidney injury superimposed on chronic kidney disease ([place]) 09/14/2021  ? Chronic kidney disease (CKD), stage IV (severe) ([place]) 06/09/2021  ? Symptomatic  anemia 12/23/2020  ? GI bleed 12/23/2020  ? Paroxysmal A-fib ([place]) 12/23/2020  ? Chronic diastolic CHF (congestive heart failure) ([place]) 12/23/2020  ? Acute CHF (congestive heart failure) ([place]) 09/24/2019  ? ARF (acute renal failure) ([place]) 09/24/2019  ? Anemia of chronic disease 09/24/2019  ? Acute respiratory failure with hypoxia ([place])   ? Secondary hyperparathyroidism, renal ([place]) 12/29/2016  ? Smoker 03/26/2016  ? Hypotension 01/29/2016  ? Abnormal myocardial perfusion study 01/29/2016  ? Depression 12/09/2015  ? Type 2 diabetes mellitus with hyperlipidemia ([place])   ? HTN (hypertension) 06/24/2014  ? 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 BPH (benign prostatic hyperplasia) 06/24/2014  ? ?PCP:  [name], MD ?Pharmacy:   ?[id number] [address], [address] AT [facility] [address] ?[place] ?[place] [address] ?Phone: [phone] Fax: [phone] ? ? ? ? ?Social Determinants of Health (SDOH) Interventions ?  ? ?Readmission Risk Interventions ? ?  11/16/2021  ?  3:15 PM  ?Readmission Risk Prevention Plan  ?Transportation Screening Complete  ?PCP or Specialist Appt within 3-5 Days Complete  ?[place] or Home Care Consult Complete  ?Social Work Consult for [place] Planning/Counseling Complete  ?Palliative Care Screening Not Applicable  ?Medication Review Press photographer) Complete  ? ? ? ?

## 2021-11-16 NOTE — Plan of Care (Signed)

## 2021-11-16 NOTE — Care Management Important Message (Signed)
Important Message ? ?Patient Details  ?Name: Adam Reid ?MRN: [id number] ?Date of Birth: 1946-06-26 ? ? ?Medicare Important Message Given:  Yes ? ? ? ? ?Shelda Altes ?11/16/2021, 8:12 AM ?

## 2021-11-16 NOTE — Assessment & Plan Note (Addendum)
CT chest with right lower lobe infiltrate with loculated pleural effusion on the right and small pleural effusion on the left. Mass like soft tissue density over the left upper abdomen.  ? ?Wbc 10.4  patient has been afebrile. ?Blood cultures with no growth.  ? ?Continue antibiotic therapy with IV ceftriaxone and oral azithromycin. ?Out of bed to chair tid, continue with PT and OT.  ?

## 2021-11-16 NOTE — Progress Notes (Signed)
Nutrition Follow-up ? ?DOCUMENTATION CODES:  ? ?Severe malnutrition in context of chronic illness, Underweight ? ?INTERVENTION:  ? ?Ensure Enlive po TID, each supplement provides 350 kcal and 20 grams of protein. ? ?Encourage PO intake  ? ? ?NUTRITION DIAGNOSIS:  ? ?Severe Malnutrition related to chronic illness (CHF) as evidenced by severe fat depletion, severe muscle depletion. ?Ongoing.  ? ?GOAL:  ? ?Patient will meet greater than or equal to 90% of their needs ?Progressing with diet and ONS  ? ?MONITOR:  ? ?PO intake, Supplement acceptance, Labs, Weight trends, Skin, I & O's ? ?REASON FOR ASSESSMENT:  ? ?Consult ?Assessment of nutrition requirement/status ? ?ASSESSMENT:  ? ?76 y.o. male with medical history significant of CKD stage IV, chronic diastolic CHF, IIDM, chronic hypoxic respiratory failure on 2 L 24/7, HTN, PAF on Eliquis, moderate protein calorie malnutrition, presented with increasing shortness of breath ? ?Meal Completion: >90% ?Per RN pt eating well and drinking ensures. Pt consumed 100% of supplement this am.  ?Pt starting Abx for CAP.  ?Glucose control improving with medications.  ? ?Medications reviewed and include: phoslo TID, lasix, SSI, tradjenta, protonix, [place] BID ?Labs reviewed: PO4: 5.3 ?CBG's: 106-244 ?  ?UOP: 1300 ml  ? ?Diet Order:   ?Diet Order   ? ?       ?  Diet heart healthy/carb modified Room service appropriate? Yes; Fluid consistency: Thin; Fluid restriction: 1500 mL Fluid  Diet effective now       ?  ? ?  ?  ? ?  ? ? ?EDUCATION NEEDS:  ? ?Not appropriate for education at this time ? ?Skin:  Skin Assessment: Reviewed RN Assessment ? ?Last BM:  5/5 ? ?Height:  ? ?Ht Readings from Last 1 Encounters:  ?11/13/21 '5\' 6"'$  (1.676 m)  ? ? ?Weight:  ? ?Wt Readings from Last 1 Encounters:  ?11/16/21 50.3 kg  ? ? ?BMI:  Body mass index is 17.88 kg/m?. ? ?Estimated Nutritional Needs:  ? ?Kcal:  1800-2000 ? ?Protein:  85-100 grams ? ?Fluid:  1.2 L/day ? ?Lockie Pares., RD, LDN, CNSC ?See  [facility] for contact information  ? ?

## 2021-11-16 NOTE — Progress Notes (Signed)
?Progress Note ? ? ?Patient: Adam Reid MWN:[id number] DOB: 1945/11/22 DOA: 11/12/2021     3 ?DOS: the patient was seen and examined on 11/16/2021 ?  ?Brief hospital course: ?Mr. [name] was admitted to the hospital with the working diagnosis of decompensated heart failure.  ? ?76 yo male with the past medical history of CKD stage IV, heart failure, hypertension, chronic hypoxemia and paroxysmal atrial fibrillation who presented with dyspnea. Reported 3 to 5 days of worsening dyspnea, which was refractive to increase oral diuretic therapy at home. On his initial physical examination his blood pressure was 152/82, HR 83, RR 22, 02 saturation 98%, lungs with bilateral rales, and increase work of breathing, heart with S1 and S2 present and rhythmic, with no gallops or murmurs, no lower extremity edema.  ? ?Na 137, K 3,6 Cl 102, bicarbonate 19, glucose 189, bun 84 cr 3,69 ?BNP 1,516  ?High sensitive troponin 40  ?Wbc 11,7, hgb 9,5 plt 353  ?TSH 4,956 ? ?UA SG 1,015 with 100 protein and 100 glucose  ? ?Chest radiograph with bilateral pleural effusion, loculated on the right with bilateral interstitial infiltrates bilaterally. ? ?EKG 78 bpm, normal axis, normal intervals, sinus rhythm with no significant ST segment or T wave changes, poor R wave progression with q in V2 and sings of LVH.  ? ?Patient was placed on furosemide for diuresis with good toleration.  ?Further work up with chest CT positive for right lower lobe infiltrate.  ?05/08 started on IV ceftriaxone for community acquired pneumonia, present on admission.   ? ?Assessment and Plan: ?* Acute on chronic diastolic (congestive) heart failure ([place]) ?07/2021 echocardiogram with preserved LV systolic function with EF 55 to 40%, RV systolic function preserved. With no significant valvular disease.  ? ?Acute cardiogenic pulmonary edema with bilateral pleural effusion.  ? ?Urine output is documented at 1,300 ml ?Blood pressure systolic 347 to 425 mmHg. ? ?No RAS  inhibition due to reduced GFR and risk of worsening renal function.  ?Continue diuresis with furosemide, transition to po in the next 24 hrs.   ? ?CAP (community acquired pneumonia) ?CT chest with right lower lobe infiltrate with loculated pleural effusion on the right and small pleural effusion on the left. Mass like soft tissue density over the left upper abdomen.  ? ?Wbc 11,7, patient has been afebrile. ? ?Plan to start patient on IV ceftriaxone and oral azithromycin ?Check blood cultures and continue oxymetry monitoring.  ?Out of bed to chair tid with meals, continue with PT and Ot.  ?Nutrition consult.  ? ?Paroxysmal atrial fibrillation ([place]) ?On amiodarone and diltiazem. ?Anticoagulation with apixaban.  ? ?Chronic kidney disease ?CKD stage 4. Hypokalemia.  ? ?Continue to improve volume status.  ?Today renal function with serum cr at 3,42 with K at 3,9 and serum bicarbonate at 16.  ?P 5,3  ? ?Plan to continue diuresis with furosemide and transition to oral in the next 24 hrs.  ?Follow up renal function and electrolytes in am, avoid hypotension and nephrotoxic medications.  ?Continue with oral sodium bicarbonate ? ?Anemia of chronic renal disease. Close follow up on Hgb.  ? ?Thyroid disease ?Elevated TSH ?Normal free T4, plan to follow up thyroid function as outpatient.  ? ? ?HTN (hypertension) ?Continue blood pressure monitoring ?Tolerating well diuresis with IV furosemide.  ? ?Diabetes mellitus without complication ([place]) ?Uncontrolled hyperglycemia  ?Glucose has improved, his fasting glucose this am is 126.  ?Plan to continue insulin sliding scale for glucose cover and monitoring.  ?.  ?On  linagliptin.  ? ?Dyslipidemia, continue with statin therapy.  ? ?Protein-calorie malnutrition, severe ?Continue with nutritional supplements.  ? ? ? ? ?  ? ?Subjective: Patient continue to have dyspnea, mild improvement but not back to baseline, continue to be very weak and deconditioned  ? ?Physical Exam: ?Vitals:  ?  11/15/21 1338 11/15/21 1926 11/16/21 0217 11/16/21 0541  ?BP: 116/62 127/64  138/69  ?Pulse: 72 67  75  ?Resp: '17 17  17  '$ ?Temp: (!) 97.5 ?F (36.4 ?C) 98.3 ?F (36.8 ?C)  98.5 ?F (36.9 ?C)  ?TempSrc: Oral Oral  Oral  ?SpO2: 94% 94%  95%  ?Weight:   50.3 kg   ?Height:      ? ?Neurology awake and alert ?ENT with mild pallor ?Cardiovascular with S1 and S2 present with no gallops, positive systolic murmur at the apex and right sternal border ?Respiratory with rales bilaterally, more at the right base ?Abdomen not distended ?No lower extremity edema  ?Data Reviewed: ? ? ? ?Family Communication: no family at the bedside  ? ?Disposition: ?Status is: Inpatient ?Remains inpatient appropriate because: IV antibiotic therapy  ? Planned Discharge Destination: Home ? ?Author: ?Tawni Millers, MD ?11/16/2021 11:17 AM ? ?For on call review [email or web address].  ?

## 2021-11-16 NOTE — Plan of Care (Signed)
  Problem: Health Behavior/Discharge Planning: Goal: Ability to manage health-related needs will improve Outcome: Progressing   

## 2021-11-16 NOTE — Evaluation (Signed)
Occupational Therapy Evaluation ?Patient Details ?Name: Adam Reid ?MRN: [id number] ?DOB: 11-22-1945 ?Today's Date: 11/16/2021 ? ? ?History of Present Illness 76 yo male with onset of SOB and worsening symptoms was admitted and found decompensated CHF on 5/4.  Pt had PAF with good rate control now, as well as management of DM and HTN, and diuresis.  PMHx:  malnutrition, thyroid disease, DM, CKD,  ? ?Clinical Impression ?  ?Prior to this admission, patient living with his wife and able to complete all ADLs and IADLs independently, reports he still drives. Currently, patient is at his baseline, per patient report and OT observation, with no need for additional OT services at this time. OT signing off on this patient, please re-consult if further acute needs arise.  ?   ? ?Recommendations for follow up therapy are one component of a multi-disciplinary discharge planning process, led by the attending physician.  Recommendations may be updated based on patient status, additional functional criteria and insurance authorization.  ? ?Follow Up Recommendations ? No OT follow up  ?  ?Assistance Recommended at Discharge PRN  ?Patient can return home with the following Assist for transportation ? ?  ?Functional Status Assessment ? Patient has had a recent decline in their functional status and demonstrates the ability to make significant improvements in function in a reasonable and predictable amount of time.  ?Equipment Recommendations ? None recommended by OT  ?  ?Recommendations for Other Services   ? ? ?  ?Precautions / Restrictions Precautions ?Precautions: None ?Precaution Comments: Watch HR and O2 ?Restrictions ?Weight Bearing Restrictions: No  ? ?  ? ?Mobility Bed Mobility ?Overal bed mobility: Independent ?  ?  ?  ?  ?  ?  ?General bed mobility comments: No use of the rails or need for extra time ?  ? ?Transfers ?  ?  ?  ?  ?  ?  ?  ?  ?  ?General transfer comment: Declining OOB with OT, reports he has been getting  out of bed and moving well, double checked with RN and NT who both ensdorse patient is able to ambulate without assist ?  ? ?  ?Balance Overall balance assessment: Modified Independent ?  ?  ?  ?  ?  ?  ?  ?  ?  ?  ?  ?  ?  ?  ?  ?  ?  ?  ?   ? ?ADL either performed or assessed with clinical judgement  ? ?ADL Overall ADL's : At baseline ?  ?  ?  ?  ?  ?  ?  ?  ?  ?  ?  ?  ?  ?  ?  ?  ?  ?  ?  ?General ADL Comments: Patient observed and reports to be at his baseline  ? ? ? ?Vision Baseline Vision/History: 1 Wears glasses (Occasionally) ?Ability to See in Adequate Light: 0 Adequate ?Patient Visual Report: No change from baseline ?   ?   ?Perception   ?  ?Praxis   ?  ? ?Pertinent Vitals/Pain Pain Assessment ?Pain Assessment: No/denies pain  ? ? ? ?Hand Dominance Right ?  ?Extremity/Trunk Assessment Upper Extremity Assessment ?Upper Extremity Assessment: Generalized weakness ?  ?Lower Extremity Assessment ?Lower Extremity Assessment: Defer to PT evaluation ?  ?Cervical / Trunk Assessment ?Cervical / Trunk Assessment: Normal ?  ?Communication Communication ?Communication: Prefers language other than [place];Other (comment) (Farsi interpreter used) ?  ?Cognition Arousal/Alertness: Awake/alert ?Behavior During Therapy: [facility] for tasks  assessed/performed ?Overall Cognitive Status: Within Functional Limits for tasks assessed ?  ?  ?  ?  ?  ?  ?  ?  ?  ?  ?  ?  ?  ?  ?  ?  ?  ?  ?  ?General Comments    ? ?  ?Exercises   ?  ?Shoulder Instructions    ? ? ?Home Living Family/patient expects to be discharged to:: Private residence ?Living Arrangements: Spouse/significant other (Wife) ?Available Help at Discharge: Family;Available 24 hours/day ?Type of Home: House ?Home Access: Level entry ?  ?  ?Home Layout: One level ?  ?  ?Bathroom Shower/Tub: Tub/shower unit ?  ?Bathroom Toilet: Standard ?  ?  ?Home Equipment: Conservation officer, nature (2 wheels);Cane - single point;BSC/3in1;Shower seat ?  ?Additional Comments: Has equipment from his  father in law but does not use ?  ? ?  ?Prior Functioning/Environment Prior Level of Function : Independent/Modified Independent;Driving ?  ?  ?  ?  ?  ?  ?Mobility Comments: Has equipment from his father in law but does not use ?ADLs Comments: Independent with ADLs ?  ? ?  ?  ?OT Problem List: Decreased strength ?  ?   ?OT Treatment/Interventions:    ?  ?OT Goals(Current goals can be found in the care plan section) Acute Rehab OT Goals ?Patient Stated Goal: to go home ?OT Goal Formulation: With patient ?Time For Goal Achievement: 11/30/21 ?Potential to Achieve Goals: Good  ?OT Frequency:   ?  ? ?Co-evaluation   ?  ?  ?  ?  ? ?  ?AM-PAC OT "6 Clicks" Daily Activity     ?Outcome Measure Help from another person eating meals?: None ?Help from another person taking care of personal grooming?: None ?Help from another person toileting, which includes using toliet, bedpan, or urinal?: None ?Help from another person bathing (including washing, rinsing, drying)?: None ?Help from another person to put on and taking off regular upper body clothing?: None ?Help from another person to put on and taking off regular lower body clothing?: None ?6 Click Score: 24 ?  ?End of Session Nurse Communication: Mobility status;Other (comment) (OT signing off) ? ?Activity Tolerance: Patient tolerated treatment well ?Patient left: in bed;with call bell/phone within reach;Other (comment) (Doctor present) ? ?OT Visit Diagnosis: Muscle weakness (generalized) (M62.81)  ?              ?Time: 3212-2482 ?OT Time Calculation (min): 10 min ?Charges:  OT General Charges ?$OT Visit: 1 Visit ?OT Evaluation ?$OT Eval Moderate Complexity: 1 Mod ? ?Corinne Ports E. Coralynn Gaona, OTR/L ?Acute Rehabilitation Services ?[phone] ?[phone]  ? ?Corinne Ports Odeth Bry ?11/16/2021, 10:39 AM ?

## 2021-11-16 NOTE — Progress Notes (Signed)
? ?Progress Note ? ?Patient Name: Adam Reid ?Date of Encounter: 11/16/2021 ? ?[facility] Cardiologist: Elouise Munroe, MD  ? ?Subjective  ? ?No acute events overnight. Sleeping comfortably this AM. ? ?Inpatient Medications  ?  ?Scheduled Meds: ? amiodarone  200 mg Oral Daily  ? apixaban  2.5 mg Oral BID  ? atorvastatin  80 mg Oral Daily  ? calcium acetate  667 mg Oral TID WC  ? citalopram  20 mg Oral Daily  ? diltiazem  240 mg Oral Daily  ? feeding supplement  237 mL Oral TID BM  ? furosemide  80 mg Intravenous BID  ? insulin aspart  0-9 Units Subcutaneous TID AC & HS  ? linagliptin  5 mg Oral Daily  ? pantoprazole  40 mg Oral Daily  ? sodium bicarbonate  650 mg Oral BID  ? sodium chloride flush  3 mL Intravenous Q12H  ? tamsulosin  0.4 mg Oral Daily  ? ?Continuous Infusions: ? sodium chloride    ? ?PRN Meds: ?sodium chloride, acetaminophen, ondansetron (ZOFRAN) IV, polyethylene glycol, sodium chloride flush  ? ?Vital Signs  ?  ?Vitals:  ? 11/15/21 1338 11/15/21 1926 11/16/21 0217 11/16/21 0541  ?BP: 116/62 127/64  138/69  ?Pulse: 72 67  75  ?Resp: '17 17  17  '$ ?Temp: (!) 97.5 ?F (36.4 ?C) 98.3 ?F (36.8 ?C)  98.5 ?F (36.9 ?C)  ?TempSrc: Oral Oral  Oral  ?SpO2: 94% 94%  95%  ?Weight:   50.3 kg   ?Height:      ? ? ?Intake/Output Summary (Last 24 hours) at 11/16/2021 1001 ?Last data filed at 11/16/2021 0845 ?Gross per 24 hour  ?Intake 817 ml  ?Output 1575 ml  ?Net -758 ml  ? ? ?  11/16/2021  ?  2:17 AM 11/15/2021  ?  4:55 AM 11/14/2021  ? 12:05 AM  ?Last 3 Weights  ?Weight (lbs) 110 lb 12.8 oz 111 lb 9.6 oz 112 lb 10.5 oz  ?Weight (kg) 50.259 kg 50.621 kg 51.1 kg  ?   ? ?Telemetry  ?  ?SR - Personally Reviewed ? ?ECG  ?  ?5/4 SR at 78 bpm - Personally Reviewed ? ?Physical Exam  ? ?GEN: No acute distress.   ?Neck: No JVD appreciated ?Cardiac: RRR, no rubs, or gallops. 2/6 systolic murmur ?Respiratory: Clear to auscultation bilaterally except for decreased breath sounds at bases ?GI: Soft, nontender, non-distended  ?MS:  No edema; No deformity. ?Neuro:  Nonfocal  ?Psych: Normal affect  ? ?Labs  ?  ?High Sensitivity Troponin:   ?Recent Labs  ?Lab 11/12/21 ?1757 11/13/21 ?0750  ?TROPONINIHS 41* 40*  ?   ?Chemistry ?Recent Labs  ?Lab 11/12/21 ?1730 11/13/21 ?0840 11/14/21 ?0559 11/15/21 ?2706 11/16/21 ?0305  ?NA 135   < > 135 137 135  ?K 4.3   < > 4.3 4.3 3.9  ?CL 104   < > 103 103 101  ?CO2 19*   < > 18* 22 16*  ?GLUCOSE 192*   < > 155* 106* 126*  ?BUN 87*   < > 82* 87* 92*  ?CREATININE 3.83*   < > 3.52* 3.42* 3.42*  ?CALCIUM 8.8*   < > 8.9 9.5 9.0  ?PROT 7.3  --   --   --   --   ?ALBUMIN 3.4*  --   --   --  2.9*  ?AST 24  --   --   --   --   ?ALT 48*  --   --   --   --   ?  ALKPHOS 456*  --   --   --   --   ?BILITOT 0.7  --   --   --   --   ?GFRNONAA 16*   < > 17* 18* 18*  ?ANIONGAP 12   < > 14 12 18*  ? < > = values in this interval not displayed.  ?  ?Lipids No results for input(s): CHOL, TRIG, HDL, LABVLDL, LDLCALC, CHOLHDL in the last 168 hours.  ?Hematology ?Recent Labs  ?Lab 11/09/21 ?1951 11/12/21 ?1730 11/13/21 ?0750  ?WBC 13.1* 15.1* 11.7*  ?RBC 3.33* 3.54* 3.49*  ?HGB 9.0* 9.7* 9.5*  ?HCT 28.7* 30.1* 29.7*  ?MCV 86.2 85.0 85.1  ?MCH 27.0 27.4 27.2  ?MCHC 31.4 32.2 32.0  ?RDW 18.0* 18.8* 18.9*  ?PLT 358 383 353  ? ?Thyroid  ?Recent Labs  ?Lab 11/13/21 ?1430 11/16/21 ?0305  ?TSH 4.956*  --   ?FREET4  --  1.00  ?  ?BNP ?Recent Labs  ?Lab 11/12/21 ?[place] 11/13/21 ?0750  ?BNP 1,880.9* 1,516.2*  ?  ?DDimer No results for input(s): DDIMER in the last 168 hours.  ? ?Radiology  ?  ?CT CHEST WO CONTRAST ? ?Result Date: 11/15/2021 ?CLINICAL DATA:  Respiratory illness. Possible pleural effusion/infiltrates. Shortness of breath. Weakness. EXAM: CT CHEST WITHOUT CONTRAST TECHNIQUE: Multidetector CT imaging of the chest was performed following the standard protocol without IV contrast. RADIATION DOSE REDUCTION: This exam was performed according to the departmental dose-optimization program which includes automated exposure control, adjustment  of the mA and/or kV according to patient size and/or use of iterative reconstruction technique. COMPARISON:  Chest x-ray 11/12/2021 FINDINGS: Cardiovascular: Mild cardiomegaly. Calcified plaque over the left main and 3 vessel coronary arteries. Calcified plaque throughout the thoracic aorta which is normal in caliber. Remaining vascular structures are unremarkable. Mediastinum/Nodes: Mild adenopathy over the superior mediastinum. 1.1 cm anterior mediastinal lymph node and 1.3 cm precarinal lymph node. Evaluation for hilar adenopathy difficult on this noncontrast exam. Remaining mediastinal structures are unremarkable. Lungs/Pleura: Lungs are adequately inflated demonstrate a moderate size right pleural fluid collection with possible areas of loculation. Small left pleural fluid collection. Associated atelectatic change in the lower lobes. Patchy hazy airspace process over the mid to upper lungs right worse than left likely multifocal infection airways are unremarkable. Upper Abdomen: Calcified plaque over the abdominal aorta. Cysts over the upper pole right kidney. Subcentimeter hypo and hyperdense exophytic masses over the mid to upper pole left kidney likely hyperdense cyst, but too small to characterize. Masslike soft tissue density over the left upper abdomen abutting the aorta and incompletely evaluated as may represent a mass versus overlapping small bowel loops. Musculoskeletal: No focal abnormality. IMPRESSION: 1. Patchy hazy airspace process over the mid to upper lungs right worse than left likely multifocal infection. Moderate size right pleural fluid collection with possible areas of loculation. Small left pleural fluid collection with associated bibasilar atelectasis. Mild mediastinal adenopathy likely reactive. 2. Masslike soft tissue density over the left upper abdomen abutting the aorta and incompletely evaluated. This may represent a mass versus unopacified small bowel loops. Recommend further  evaluation with abdominal CT with contrast. 3. Right renal cysts. Subcentimeter left renal hypodensity too small to characterize but likely a cyst. Subcentimeter hyperdense left renal masses likely hyperdense cysts, but too small to characterize. Recommend attention on follow-up. 4. Aortic atherosclerosis. Atherosclerotic coronary artery disease. Aortic Atherosclerosis (ICD10-I70.0). Electronically Signed   By: Marin Olp M.D.   On: 11/15/2021 13:57  ? ?ECHOCARDIOGRAM COMPLETE ? ?Result Date:  11/14/2021 ?   ECHOCARDIOGRAM REPORT   Patient Name:   DARRELL HAUK Date of Exam: 11/14/2021 Medical Rec #:  [id number]        Height:       66.0 in Accession #:    [id number]       Weight:       112.7 lb Date of Birth:  [birth year]        BSA:          1.567 m? Patient Age:    42 years         BP:           156/80 mmHg Patient Gender: M                HR:           76 bpm. 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 Abnormal ECG, Mitral Valve Disease,                 Arrythmias:Atrial Fibrillation; Risk Factors:Hypertension,                 Diabetes and Current Smoker.  Sonographer:    Roseanna Rainbow RDCS Referring Phys: [place]  1. Left ventricular ejection fraction, by estimation, is 55 to 60%. The left ventricle has normal function. The left ventricle has no regional wall motion abnormalities. There is mild left ventricular hypertrophy of the basal-septal segment. Left ventricular diastolic parameters are consistent with Grade III diastolic dysfunction (restrictive). Elevated left atrial pressure.  2. Right ventricular systolic function is normal. The right  ventricular size is mildly enlarged. There is mildly elevated pulmonary artery systolic pressure.  3. Left atrial size was moderately dilated.  4. 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

## 2021-11-17 ENCOUNTER — Inpatient Hospital Stay (HOSPITAL_COMMUNITY): Payer: Medicare Other

## 2021-11-17 DIAGNOSIS — I480 Paroxysmal atrial fibrillation: Secondary | ICD-10-CM | POA: Diagnosis not present

## 2021-11-17 DIAGNOSIS — J189 Pneumonia, unspecified organism: Secondary | ICD-10-CM | POA: Diagnosis not present

## 2021-11-17 DIAGNOSIS — E119 Type 2 diabetes mellitus without complications: Secondary | ICD-10-CM | POA: Diagnosis not present

## 2021-11-17 DIAGNOSIS — R1902 Left upper quadrant abdominal swelling, mass and lump: Secondary | ICD-10-CM

## 2021-11-17 DIAGNOSIS — I5033 Acute on chronic diastolic (congestive) heart failure: Secondary | ICD-10-CM | POA: Diagnosis not present

## 2021-11-17 DIAGNOSIS — N184 Chronic kidney disease, stage 4 (severe): Secondary | ICD-10-CM | POA: Diagnosis not present

## 2021-11-17 DIAGNOSIS — R1900 Intra-abdominal and pelvic swelling, mass and lump, unspecified site: Secondary | ICD-10-CM

## 2021-11-17 LAB — BASIC METABOLIC PANEL
Anion gap: 15 (ref 5–15)
Anion gap: 16 — ABNORMAL HIGH (ref 5–15)
BUN: 100 mg/dL — ABNORMAL HIGH (ref 8–23)
BUN: 103 mg/dL — ABNORMAL HIGH (ref 8–23)
CO2: 21 mmol/L — ABNORMAL LOW (ref 22–32)
CO2: 24 mmol/L (ref 22–32)
Calcium: 8.9 mg/dL (ref 8.9–10.3)
Calcium: 9 mg/dL (ref 8.9–10.3)
Chloride: 96 mmol/L — ABNORMAL LOW (ref 98–111)
Chloride: 98 mmol/L (ref 98–111)
Creatinine, Ser: 3.5 mg/dL — ABNORMAL HIGH (ref 0.61–1.24)
Creatinine, Ser: 3.79 mg/dL — ABNORMAL HIGH (ref 0.61–1.24)
GFR, Estimated: 16 mL/min — ABNORMAL LOW (ref >60)
GFR, Estimated: 18 mL/min — ABNORMAL LOW (ref >60)
Glucose, Bld: 171 mg/dL — ABNORMAL HIGH (ref 70–99)
Glucose, Bld: 256 mg/dL — ABNORMAL HIGH (ref 70–99)
Potassium: 3.9 mmol/L (ref 3.5–5.1)
Potassium: 4.2 mmol/L (ref 3.5–5.1)
Sodium: 135 mmol/L (ref 135–145)
Sodium: 135 mmol/L (ref 135–145)

## 2021-11-17 LAB — GLUCOSE, CAPILLARY
Glucose-Capillary: 152 mg/dL — ABNORMAL HIGH (ref 70–99)
Glucose-Capillary: 297 mg/dL — ABNORMAL HIGH (ref 70–99)
Glucose-Capillary: 50 mg/dL — ABNORMAL LOW (ref 70–99)
Glucose-Capillary: 119 mg/dL — ABNORMAL HIGH (ref 70–99)
Glucose-Capillary: 297 mg/dL — ABNORMAL HIGH (ref 70–99)

## 2021-11-17 LAB — CBC
HCT: 30.2 % — ABNORMAL LOW (ref 39.0–52.0)
Hemoglobin: 9.3 g/dL — ABNORMAL LOW (ref 13.0–17.0)
MCH: 26.6 pg (ref 26.0–34.0)
MCHC: 30.8 g/dL (ref 30.0–36.0)
MCV: 86.3 fL (ref 80.0–100.0)
Platelets: 345 10*3/uL (ref 150–400)
RBC: 3.5 MIL/uL — ABNORMAL LOW (ref 4.22–5.81)
RDW: 18.8 % — ABNORMAL HIGH (ref 11.5–15.5)
WBC: 10.4 10*3/uL (ref 4.0–10.5)
nRBC: 0 % (ref 0.0–0.2)

## 2021-11-17 LAB — T3, FREE: T3, Free: 1.1 pg/mL — ABNORMAL LOW (ref 2.0–4.4)

## 2021-11-17 MED ORDER — IOHEXOL 9 MG/ML PO SOLN
ORAL | Status: AC
  Administered 2021-11-17: 500 mL
  Filled 2021-11-17: qty 1000

## 2021-11-17 MED ORDER — TORSEMIDE 20 MG PO TABS
40.0000 mg | ORAL_TABLET | Freq: Two times a day (BID) | ORAL | Status: DC
  Administered 2021-11-17 – 2021-11-18 (×3): 40 mg via ORAL
  Filled 2021-11-17 (×4): qty 2

## 2021-11-17 NOTE — Assessment & Plan Note (Addendum)
Left abdominal mass, new diagnosis and confirmed with abdominal US ?Patient not able to get contrast due to low GFR. ? ?Plan to get non contrast CT abdomen and pelvis  ?Per his wife at the bedside patient had recent significant non intentional weight loss.  ? ?GI work up in 12/2020 with endoscopy, capsule endoscopy and in 11/2019 colonoscopy with no signs of malignancy. Positive Barrett's esophagus  ?

## 2021-11-17 NOTE — Progress Notes (Signed)
? ?Progress Note ? ?Patient Name: Adam Reid ?Date of Encounter: 11/17/2021 ? ?[facility] Cardiologist: Elouise Munroe, MD  ? ?Subjective  ? ?Interpreter used. ?Pt feels his breathing continues to improve. No chest pain.  ? ?Inpatient Medications  ?  ?Scheduled Meds: ? amiodarone  200 mg Oral Daily  ? apixaban  2.5 mg Oral BID  ? atorvastatin  80 mg Oral Daily  ? azithromycin  500 mg Oral Daily  ? calcium acetate  667 mg Oral TID WC  ? citalopram  20 mg Oral Daily  ? diltiazem  240 mg Oral Daily  ? feeding supplement  237 mL Oral TID BM  ? insulin aspart  0-9 Units Subcutaneous TID AC & HS  ? linagliptin  5 mg Oral Daily  ? pantoprazole  40 mg Oral Daily  ? sodium bicarbonate  650 mg Oral BID  ? sodium chloride flush  3 mL Intravenous Q12H  ? tamsulosin  0.4 mg Oral Daily  ? torsemide  40 mg Oral BID  ? ?Continuous Infusions: ? sodium chloride 250 mL (11/16/21 1058)  ? cefTRIAXone (ROCEPHIN)  IV 1 g (11/16/21 1059)  ? ?PRN Meds: ?sodium chloride, acetaminophen, ondansetron (ZOFRAN) IV, polyethylene glycol, sodium chloride flush  ? ?Vital Signs  ?  ?Vitals:  ? 11/16/21 0541 11/16/21 1124 11/16/21 1918 11/17/21 0350  ?BP: 138/69 129/61 (!) 113/59 122/65  ?Pulse: 75 75 73 74  ?Resp: $Remov'17 17 19 15  'wxRSat$ ?Temp: 98.5 ?F (36.9 ?C) (!) 97.5 ?F (36.4 ?C) 97.8 ?F (36.6 ?C) 98.3 ?F (36.8 ?C)  ?TempSrc: Oral Oral Oral Oral  ?SpO2: 95% 98% 96% 97%  ?Weight:    50.1 kg  ?Height:      ? ? ?Intake/Output Summary (Last 24 hours) at 11/17/2021 0955 ?Last data filed at 11/17/2021 3557 ?Gross per 24 hour  ?Intake 1307.96 ml  ?Output 1525 ml  ?Net -217.04 ml  ? ? ?  11/17/2021  ?  3:50 AM 11/16/2021  ?  2:17 AM 11/15/2021  ?  4:55 AM  ?Last 3 Weights  ?Weight (lbs) 110 lb 6.4 oz 110 lb 12.8 oz 111 lb 9.6 oz  ?Weight (kg) 50.077 kg 50.259 kg 50.621 kg  ?   ? ?Telemetry  ?  ?Sinus rhythm in the 80s - Personally Reviewed ? ?ECG  ?  ?No new tracings - Personally Reviewed ? ?Physical Exam  ? ?GEN: No acute distress.   ?Neck: + JVD ?Cardiac: RRR,  4/6 murmur heard at the apex  ?Respiratory: Clear to auscultation bilaterally. ?GI: Soft, nontender, non-distended  ?MS: No edema; No deformity. ?Neuro:  Nonfocal  ?Psych: Normal affect  ? ?Labs  ?  ?High Sensitivity Troponin:   ?Recent Labs  ?Lab 11/12/21 ?1757 11/13/21 ?0750  ?TROPONINIHS 41* 40*  ?   ?Chemistry ?Recent Labs  ?Lab 11/12/21 ?1730 11/13/21 ?0840 11/15/21 ?3220 11/16/21 ?2542 11/16/21 ?2350  ?NA 135   < > 137 135 135  ?K 4.3   < > 4.3 3.9 4.2  ?CL 104   < > 103 101 98  ?CO2 19*   < > 22 16* 21*  ?GLUCOSE 192*   < > 106* 126* 171*  ?BUN 87*   < > 87* 92* 103*  ?CREATININE 3.83*   < > 3.42* 3.42* 3.50*  ?CALCIUM 8.8*   < > 9.5 9.0 8.9  ?PROT 7.3  --   --   --   --   ?ALBUMIN 3.4*  --   --  2.9*  --   ?  AST 24  --   --   --   --   ?ALT 48*  --   --   --   --   ?ALKPHOS 456*  --   --   --   --   ?BILITOT 0.7  --   --   --   --   ?GFRNONAA 16*   < > 18* 18* 18*  ?ANIONGAP 12   < > 12 18* 16*  ? < > = values in this interval not displayed.  ?  ?Lipids No results for input(s): CHOL, TRIG, HDL, LABVLDL, LDLCALC, CHOLHDL in the last 168 hours.  ?Hematology ?Recent Labs  ?Lab 11/12/21 ?1730 11/13/21 ?0750 11/16/21 ?2350  ?WBC 15.1* 11.7* 10.4  ?RBC 3.54* 3.49* 3.50*  ?HGB 9.7* 9.5* 9.3*  ?HCT 30.1* 29.7* 30.2*  ?MCV 85.0 85.1 86.3  ?MCH 27.4 27.2 26.6  ?MCHC 32.2 32.0 30.8  ?RDW 18.8* 18.9* 18.8*  ?PLT 383 353 345  ? ?Thyroid  ?Recent Labs  ?Lab 11/13/21 ?1430 11/16/21 ?0305  ?TSH 4.956*  --   ?FREET4  --  1.00  ?  ?BNP ?Recent Labs  ?Lab 11/12/21 ?[place] 11/13/21 ?0750  ?BNP 1,880.9* 1,516.2*  ?  ?DDimer No results for input(s): DDIMER in the last 168 hours.  ? ?Radiology  ?  ?CT CHEST WO CONTRAST ? ?Result Date: 11/15/2021 ?CLINICAL DATA:  Respiratory illness. Possible pleural effusion/infiltrates. Shortness of breath. Weakness. EXAM: CT CHEST WITHOUT CONTRAST TECHNIQUE: Multidetector CT imaging of the chest was performed following the standard protocol without IV contrast. RADIATION DOSE REDUCTION: This exam was  performed according to the departmental dose-optimization program which includes automated exposure control, adjustment of the mA and/or kV according to patient size and/or use of iterative reconstruction technique. COMPARISON:  Chest x-ray 11/12/2021 FINDINGS: Cardiovascular: Mild cardiomegaly. Calcified plaque over the left main and 3 vessel coronary arteries. Calcified plaque throughout the thoracic aorta which is normal in caliber. Remaining vascular structures are unremarkable. Mediastinum/Nodes: Mild adenopathy over the superior mediastinum. 1.1 cm anterior mediastinal lymph node and 1.3 cm precarinal lymph node. Evaluation for hilar adenopathy difficult on this noncontrast exam. Remaining mediastinal structures are unremarkable. Lungs/Pleura: Lungs are adequately inflated demonstrate a moderate size right pleural fluid collection with possible areas of loculation. Small left pleural fluid collection. Associated atelectatic change in the lower lobes. Patchy hazy airspace process over the mid to upper lungs right worse than left likely multifocal infection airways are unremarkable. Upper Abdomen: Calcified plaque over the abdominal aorta. Cysts over the upper pole right kidney. Subcentimeter hypo and hyperdense exophytic masses over the mid to upper pole left kidney likely hyperdense cyst, but too small to characterize. Masslike soft tissue density over the left upper abdomen abutting the aorta and incompletely evaluated as may represent a mass versus overlapping small bowel loops. Musculoskeletal: No focal abnormality. IMPRESSION: 1. Patchy hazy airspace process over the mid to upper lungs right worse than left likely multifocal infection. Moderate size right pleural fluid collection with possible areas of loculation. Small left pleural fluid collection with associated bibasilar atelectasis. Mild mediastinal adenopathy likely reactive. 2. Masslike soft tissue density over the left upper abdomen abutting the  aorta and incompletely evaluated. This may represent a mass versus unopacified small bowel loops. Recommend further evaluation with abdominal CT with contrast. 3. Right renal cysts. Subcentimeter left renal hypodensity too small to characterize but likely a cyst. Subcentimeter hyperdense left renal masses likely hyperdense cysts, but too small to characterize. Recommend attention on follow-up. 4.  Aortic atherosclerosis. Atherosclerotic coronary artery disease. Aortic Atherosclerosis (ICD10-I70.0). Electronically Signed   By: Marin Olp M.D.   On: 11/15/2021 13:57  ? ?US Abdomen Limited ? ?Result Date: 11/16/2021 ?CLINICAL DATA:  Mass in left upper quadrant of the abdomen. EXAM: ULTRASOUND ABDOMEN LIMITED COMPARISON:  CT chest 11/15/2021. FINDINGS: Focused ultrasound in the left upper quadrant was performed. As seen on comparison CT, there is a heterogeneous, partly cystic mass in the left upper quadrant, measuring 4.6 x 6.3 x 6.0 cm. IMPRESSION: Solid mass in the left upper quadrant, with a minimal cystic component. CT abdomen pelvis with contrast is recommended in further evaluation as findings are worrisome for malignancy. Electronically Signed   By: Lorin Picket M.D.   On: 11/16/2021 13:54   ? ?Cardiac Studies  ? ?Cardiac catheterization July 2017 showed 20% ramus, 50% second diagonal, 20% mid LAD and normal LVEDP.   ?  ?PYP scan April 2021 felt likely negative; multiple myeloma panel did not demonstrate monoclonal protein spike.   ?  ?Monitor May 2021 showed sinus rhythm with short (longest 6 beats) runs of nonsustained ventricular tachycardia, short runs of atrial tachycardia, PACs and PVCs.   ?  ?Last echocardiogram January 2023 showed normal LV function, mild left ventricular hypertrophy, mildly elevated pulmonary pressure, mild mitral regurgitation. ? ?Patient Profile  ?   ?76 y.o. male with past medical history of chronic diastolic congestive heart failure, stage IV kidney disease, paroxysmal atrial  fibrillation, hypertension, hyperlipidemia, diabetes mellitus, chronic hypoxic respiratory failure on home O2 being assessed for acute on chronic diastolic congestive heart failure. ? ?Assessment & Plan

## 2021-11-17 NOTE — Progress Notes (Signed)
Hypoglycemic Event ? ?CBG: 50 ? ?Treatment: 4 oz juice/soda ? ?Symptoms: None ? ?Follow-up CBG: Time:1656 CBG Result:119 ? ?Possible Reasons for Event: Inadequate meal intake ? ?Comments/MD notified:Dr. Arrien notified of hypoglycemic event ? ? ? ?Phylliss Bob P ? ? ?

## 2021-11-17 NOTE — Progress Notes (Signed)
Physical Therapy Treatment ?Patient Details ?Name: Adam Reid ?MRN: [id number] ?DOB: [birth year] ?Today's Date: 11/17/2021 ? ? ?History of Present Illness 76 yo male with onset of SOB and worsening symptoms was admitted and found decompensated CHF on 5/4.  Pt had PAF with good rate control now, as well as management of DM and HTN, and diuresis.  PMHx:  malnutrition, thyroid disease, DM, CKD, ? ?  ?PT Comments  ? ? Patient progressing well with mobility. Reports feeling good today. Session focused on higher level balance and progressive ambulation. Scored 21/24 on DGI indicating pt is not a fall risk. Tolerated changes in gait speed, head turns, stepping over objects etc without LOB or difficulty. Sp02 remained >91% on 2L/min 02 [place]. Encouraged walking daily in hallway with family. Pt does not require further skilled therapy services as pt functioning close to baseline. All goals met. Discharge from therapy.   ?Recommendations for follow up therapy are one component of a multi-disciplinary discharge planning process, led by the attending physician.  Recommendations may be updated based on patient status, additional functional criteria and insurance authorization. ? ?Follow Up Recommendations ? No PT follow up ?  ?  ?Assistance Recommended at Discharge PRN  ?Patient can return home with the following   ?  ?Equipment Recommendations ? None recommended by PT  ?  ?Recommendations for Other Services   ? ? ?  ?Precautions / Restrictions Precautions ?Precautions: None ?Restrictions ?Weight Bearing Restrictions: No  ?  ? ?Mobility ? Bed Mobility ?Overal bed mobility: Independent ?  ?  ?  ?  ?  ?  ?General bed mobility comments: No use of the rails or need for extra time ?  ? ?Transfers ?Overall transfer level: Independent ?Equipment used: None ?  ?  ?  ?  ?  ?  ?  ?General transfer comment: Stood from EOB without difficulty. ?  ? ?Ambulation/Gait ?Ambulation/Gait assistance: Modified independent (Device/Increase time) ?Gait  Distance (Feet): 400 Feet ?Assistive device: None ?Gait Pattern/deviations: Step-through pattern, Decreased stride length ?Gait velocity: reduced ?Gait velocity interpretation: 1.31 - 2.62 ft/sec, indicative of limited community ambulator ?  ?General Gait Details: Steady gait without use of DME, tolerated higher level balance activities without difficulty. See balance section. VSS on 2L/min 02 [place]. SP02 >91%. ? ? ?Stairs ?  ?  ?  ?  ?  ? ? ?Wheelchair Mobility ?  ? ?Modified Rankin (Stroke Patients Only) ?  ? ? ?  ?Balance Overall balance assessment: Needs assistance ?Sitting-balance support: Feet supported, No upper extremity supported ?Sitting balance-Leahy Scale: Normal ?  ?  ?Standing balance support: During functional activity ?Standing balance-Leahy Scale: Good ?  ?  ?  ?  ?  ?  ?  ?High level balance activites: Head turns, Sudden stops, Turns, Direction changes ?High Level Balance Comments: Able to reach down and pick up objects from floor without difficulty or LOB. ?Standardized Balance Assessment ?Standardized Balance Assessment : Dynamic Gait Index ?  ?Dynamic Gait Index ?Level Surface: Normal ?Change in Gait Speed: Normal ?Gait with Horizontal Head Turns: Normal ?Gait with Vertical Head Turns: Normal ?Gait and Pivot Turn: Mild Impairment ?Step Over Obstacle: Mild Impairment ?Step Around Obstacles: Normal ?Steps: Mild Impairment ?Total Score: 21 ?  ? ?  ?Cognition Arousal/Alertness: Awake/alert ?Behavior During Therapy: [facility] for tasks assessed/performed ?Overall Cognitive Status: Within Functional Limits for tasks assessed ?  ?  ?  ?  ?  ?  ?  ?  ?  ?  ?  ?  ?  ?  ?  ?  ?  ?  ?  ? ?  ?  Exercises   ? ?  ?General Comments   ?  ?  ? ?Pertinent Vitals/Pain Pain Assessment ?Pain Assessment: No/denies pain  ? ? ?Home Living   ?  ?  ?  ?  ?  ?  ?  ?  ?  ?   ?  ?Prior Function    ?  ?  ?   ? ?PT Goals (current goals can now be found in the care plan section) Progress towards PT goals: Goals met/education completed,  patient discharged from PT ? ?  ?Frequency ? ? ? Min 3X/week ? ? ? ?  ?PT Plan Discharge plan needs to be updated  ? ? ?Co-evaluation   ?  ?  ?  ?  ? ?  ?AM-PAC PT "6 Clicks" Mobility   ?Outcome Measure ? Help needed turning from your back to your side while in a flat bed without using bedrails?: None ?Help needed moving from lying on your back to sitting on the side of a flat bed without using bedrails?: None ?Help needed moving to and from a bed to a chair (including a wheelchair)?: None ?Help needed standing up from a chair using your arms (e.g., wheelchair or bedside chair)?: None ?Help needed to walk in hospital room?: None ?Help needed climbing 3-5 steps with a railing? : A Little ?6 Click Score: 23 ? ?  ?End of Session Equipment Utilized During Treatment: Gait belt ?Activity Tolerance: Patient tolerated treatment well ?Patient left: in bed;with call bell/phone within reach ?Nurse Communication: Mobility status ?PT Visit Diagnosis: Muscle weakness (generalized) (M62.81) ?  ? ? ?Time: 0223-3612 ?PT Time Calculation (min) (ACUTE ONLY): 19 min ? ?Charges:  $Neuromuscular Re-education: 8-22 mins          ?          ? ?Marisa Severin, PT, DPT ?Acute Rehabilitation Services ?Secure chat preferred ?Office [phone] ? ? ? ? ? ?[place] ?11/17/2021, 11:09 AM ? ?

## 2021-11-17 NOTE — Progress Notes (Signed)
?Progress Note ? ? ?Patient: Adam Reid XTK:[id number] DOB: Jan 06, 1946 DOA: 11/12/2021     4 ?DOS: the patient was seen and examined on 11/17/2021 ?  ?Brief hospital course: ?Adam Reid was admitted to the hospital with the working diagnosis of decompensated heart failure in the setting of right lower lobe pneumonia. ?New left upper abdominal mass.   ? ?76 yo male with the past medical history of CKD stage IV, heart failure, hypertension, chronic hypoxemia and paroxysmal atrial fibrillation who presented with dyspnea. Reported 3 to 5 days of worsening dyspnea, which was refractive to increase oral diuretic therapy at home. On his initial physical examination his blood pressure was 152/82, HR 83, RR 22, 02 saturation 98%, lungs with bilateral rales, and increase work of breathing, heart with S1 and S2 present and rhythmic, with no gallops or murmurs, no lower extremity edema.  ? ?Na 137, K 3,6 Cl 102, bicarbonate 19, glucose 189, bun 84 cr 3,69 ?BNP 1,516  ?High sensitive troponin 40  ?Wbc 11,7, hgb 9,5 plt 353  ?TSH 4,956 ? ?UA SG 1,015 with 100 protein and 100 glucose  ? ?Chest radiograph with bilateral pleural effusion, loculated on the right with bilateral interstitial infiltrates bilaterally. ? ?EKG 78 bpm, normal axis, normal intervals, sinus rhythm with no significant ST segment or T wave changes, poor R wave progression with q in V2 and sings of LVH.  ? ?Patient was placed on furosemide for diuresis with good toleration.  ?Further work up with chest CT positive for right lower lobe infiltrate.  ?05/08 started on IV ceftriaxone for community acquired pneumonia, present on admission.   ?Left upper abdomen mass confirmed with [place] ?Plan for CT abdomen and pelvis for further work up.  ? ?Assessment and Plan: ?* Acute on chronic diastolic (congestive) heart failure ([place]) ?07/2021 echocardiogram with preserved LV systolic function with EF 55 to 99%, RV systolic function preserved. With no significant valvular  disease.  ? ?Acute cardiogenic pulmonary edema with bilateral pleural effusion.  ? ?Urine output is documented at 1,700 ml ?Blood pressure systolic 242 to 683 mmHg. ? ?No RAS inhibition due to reduced GFR and risk of worsening renal function.  ?Patient has been transitioned to torsemide to keep negative fluid balance.  ? ?CAP (community acquired pneumonia) ?CT chest with right lower lobe infiltrate with loculated pleural effusion on the right and small pleural effusion on the left. Mass like soft tissue density over the left upper abdomen.  ? ?Wbc 10.4  patient has been afebrile. ?Blood cultures with no growth.  ? ?Continue antibiotic therapy with IV ceftriaxone and oral azithromycin. ?Out of bed to chair tid, continue with PT and OT.  ? ?Paroxysmal atrial fibrillation ([place]) ?On amiodarone and diltiazem. ?Anticoagulation with apixaban.  ? ?Chronic kidney disease ?CKD stage 4. Hypokalemia.  ? ?Renal function with serum cr at 3,79 with K at 3,9 and serum bicarbonate at 24.  ? ?Continue diuresis with torsemide and plan to follow up renal function and electrolytes.  ? ?Anemia of chronic renal disease. Close follow up on Hgb.  ? ?Thyroid disease ?Elevated TSH ?Normal free T4, plan to follow up thyroid function as outpatient.  ? ? ?HTN (hypertension) ?Blood pressure has been stable, continue diuresis with torsemide.  ? ?Diabetes mellitus without complication ([place]) ?Uncontrolled hyperglycemia  ? ?Fasting glucose this am is 256 mg/dl ? ?Continue with insulin sliding scale for glucose cover and monitoring.  ?Patient is tolerating po well.  ?Continue with linagliptin.  ? ?Dyslipidemia, continue with statin  therapy.  ? ?Protein-calorie malnutrition, severe ?Continue with nutritional supplements.  ? ?Abdominal mass ?Left abdominal mass, new diagnosis and confirmed with abdominal US ?Patient not able to get contrast due to low GFR. ? ?Plan to get non contrast CT abdomen and pelvis  ?Per his wife at the bedside patient had recent  significant non intentional weight loss.  ? ?GI work up in 12/2020 with endoscopy, capsule endoscopy and in 11/2019 colonoscopy with no signs of malignancy. Positive Barrett's esophagus  ? ? ? ? ?  ? ?Subjective: Patient is feeling better, dyspnea has improved, no abdominal paijn  ? ?Physical Exam: ?Vitals:  ? 11/16/21 1124 11/16/21 1918 11/17/21 0350 11/17/21 1112  ?BP: 129/61 (!) 113/59 122/65 139/74  ?Pulse: 75 73 74 79  ?Resp: '17 19 15 18  '$ ?Temp: (!) 97.5 ?F (36.4 ?C) 97.8 ?F (36.6 ?C) 98.3 ?F (36.8 ?C) 98.7 ?F (37.1 ?C)  ?TempSrc: Oral Oral Oral Oral  ?SpO2: 98% 96% 97% 100%  ?Weight:   50.1 kg   ?Height:      ? ?Neurology awake and alert ?ENT with positive pallor ?Cardiovascular with S1 and S2 present and rhythmic with no gallops, rubs or murmurs ?Respiratory with no wheezing, but positive rales at the right side.  ?Abdomen with no distention  ?No lower extremity edema  ?Data Reviewed: ? ? ? ?Family Communication: I spoke with patient's wife at the bedside, we talked in detail about patient's condition, plan of care and prognosis and all questions were addressed. ? ? ?Disposition: ?Status is: Inpatient ?Remains inpatient appropriate because: further work up for new abdominal mass  ? Planned Discharge Destination: Home ? ?Author: ?Tawni Millers, MD ?11/17/2021 1:41 PM ? ?For on call review [email or web address].  ?

## 2021-11-18 DIAGNOSIS — I5033 Acute on chronic diastolic (congestive) heart failure: Secondary | ICD-10-CM | POA: Diagnosis not present

## 2021-11-18 LAB — GLUCOSE, CAPILLARY
Glucose-Capillary: 166 mg/dL — ABNORMAL HIGH (ref 70–99)
Glucose-Capillary: 141 mg/dL — ABNORMAL HIGH (ref 70–99)
Glucose-Capillary: 171 mg/dL — ABNORMAL HIGH (ref 70–99)
Glucose-Capillary: 214 mg/dL — ABNORMAL HIGH (ref 70–99)

## 2021-11-18 LAB — CBC
HCT: 28.8 % — ABNORMAL LOW (ref 39.0–52.0)
Hemoglobin: 9.2 g/dL — ABNORMAL LOW (ref 13.0–17.0)
MCH: 27.3 pg (ref 26.0–34.0)
MCHC: 31.9 g/dL (ref 30.0–36.0)
MCV: 85.5 fL (ref 80.0–100.0)
Platelets: 324 10*3/uL (ref 150–400)
RBC: 3.37 MIL/uL — ABNORMAL LOW (ref 4.22–5.81)
RDW: 18.6 % — ABNORMAL HIGH (ref 11.5–15.5)
WBC: 10.2 10*3/uL (ref 4.0–10.5)
nRBC: 0 % (ref 0.0–0.2)

## 2021-11-18 LAB — BASIC METABOLIC PANEL
Anion gap: 13 (ref 5–15)
BUN: 98 mg/dL — ABNORMAL HIGH (ref 8–23)
CO2: 22 mmol/L (ref 22–32)
Calcium: 8.8 mg/dL — ABNORMAL LOW (ref 8.9–10.3)
Chloride: 97 mmol/L — ABNORMAL LOW (ref 98–111)
Creatinine, Ser: 3.59 mg/dL — ABNORMAL HIGH (ref 0.61–1.24)
GFR, Estimated: 17 mL/min — ABNORMAL LOW (ref >60)
Glucose, Bld: 156 mg/dL — ABNORMAL HIGH (ref 70–99)
Potassium: 4.2 mmol/L (ref 3.5–5.1)
Sodium: 132 mmol/L — ABNORMAL LOW (ref 135–145)

## 2021-11-18 MED ORDER — INSULIN ASPART 100 UNIT/ML IJ SOLN
0.0000 [IU] | Freq: Three times a day (TID) | INTRAMUSCULAR | Status: DC
  Administered 2021-11-18: 1 [IU] via SUBCUTANEOUS
  Administered 2021-11-18: 0 [IU] via SUBCUTANEOUS
  Administered 2021-11-19 – 2021-11-20 (×2): 1 [IU] via SUBCUTANEOUS
  Administered 2021-11-20: 4 [IU] via SUBCUTANEOUS
  Administered 2021-11-20: 5 [IU] via SUBCUTANEOUS
  Administered 2021-11-21: 1 [IU] via SUBCUTANEOUS
  Administered 2021-11-21: 2 [IU] via SUBCUTANEOUS
  Administered 2021-11-22 (×2): 1 [IU] via SUBCUTANEOUS

## 2021-11-18 NOTE — Progress Notes (Signed)
Patient walked halls on room air with no distress.  ?

## 2021-11-18 NOTE — Progress Notes (Signed)
Heart Failure Navigator Progress Note ? ?Assessed for Heart & Vascular TOC clinic readiness.  ?Patient does not meet criteria due to has a appointment with the Heart failure clinic on 11/30/21 and a appointment with Dr. Haroldine Laws on 01/01/22.  ? ? ? ?Earnestine Leys, BSN, RN ?Heart Failure Nurse Navigator ?Secure Chat Only   ?

## 2021-11-18 NOTE — Progress Notes (Signed)
Inpatient Diabetes Program Recommendations ? ?AACE/ADA: New Consensus Statement on Inpatient Glycemic Control (2015) ? ?Target Ranges:  Prepandial:   less than 140 mg/dL ?     Peak postprandial:   less than 180 mg/dL (1-2 hours) ?     Critically ill patients:  140 - 180 mg/dL  ? ?Lab Results  ?Component Value Date  ? GLUCAP 166 (H) 11/18/2021  ? HGBA1C 7.9 (H) 08/27/2021  ? ? ?Review of Glycemic Control ? Latest Reference Range & Units 11/17/21 16:15 11/17/21 16:56 11/17/21 21:36 11/18/21 06:08  ?Glucose-Capillary 70 - 99 mg/dL 50 (L) 119 (H) 297 (H) 166 (H)  ?(L): Data is abnormally low ?(H): Data is abnormally high ?Diabetes history: Type 2 DM ?Outpatient Diabetes medications: Januvia  25 mg QD, Glipizide 5 mg BID ?Current orders for Inpatient glycemic control: Tradjenta 5 mg QD, Novolog 0-9 units TID & HS ? ?Inpatient Diabetes Program Recommendations:   ? ?Noted hypoglycemia yesterday following Novolog correction. If to remain inpatient, consider decreasing correction to Novolog 0-6 units TID.  ? ?Thanks, ?Bronson Curb, MSN, RNC-OB ?Diabetes Coordinator ?[phone] (8a-5p) ? ? ? ? ?

## 2021-11-18 NOTE — TOC Progression Note (Signed)
Transition of Care (TOC) - Progression Note  ? ? ?Patient Details  ?Name: Adam Reid ?MRN: [id number] ?Date of Birth: [birth year] ? ?Transition of Care (TOC) CM/SW Contact  ?Zenon Mayo, RN ?Phone Number: ?11/18/2021, 2:18 PM ? ?Clinical Narrative:    ?Patient has been started on torsemide po, has abd mass, consult IR for biopsy. Continue to wean of oxygen. TOC will continue to follow for dc needs.  ? ? ?Expected Discharge Plan: [place] ?Barriers to Discharge: Continued Medical Work up ? ?Expected Discharge Plan and Services ?Expected Discharge Plan: [place] ?In-house Referral: NA ?Discharge Planning Services: CM Consult ?Post Acute Care Choice: Home Health ?Living arrangements for the past 2 months: [place] ?                ?  ?DME Agency: NA ?  ?  ?  ?HH Arranged: PT ?  ?  ?  ?  ? ? ?Social Determinants of Health (SDOH) Interventions ?  ? ?Readmission Risk Interventions ? ?  11/16/2021  ?  3:15 PM  ?Readmission Risk Prevention Plan  ?Transportation Screening Complete  ?PCP or Specialist Appt within 3-5 Days Complete  ?[place] or Home Care Consult Complete  ?Social Work Consult for [place] Planning/Counseling Complete  ?Palliative Care Screening Not Applicable  ?Medication Review Press photographer) Complete  ? ? ?

## 2021-11-18 NOTE — Progress Notes (Signed)
?PROGRESS NOTE ? ? ? Adam Reid  ZSW:[id number] DOB: [birth year] DOA: 11/12/2021 ?PCP: [name], MD  ?[phone] with chronic diastolic CHF, chronic hypoxic respiratory failure on home O2, CKD 4 was admitted with cough, worsening dyspnea, in the ED chest x-ray noted bilateral pleural effusions,?  Loculation on the right, seen by cardiology in consultation, started on diuretics ?-Treated with IV ceftriaxone, azithromycin for community-acquired pneumonia as well ?-CT chest noted right-sided pneumonia, multifocal infection, moderate right pleural effusion with possible areas of loculation and a soft tissue mass on the left  ?-CT abdomen pelvis patchy hazy airspace process over the mid to upper lungs right ?worse than left likely multifocal infection. Moderate size right pleural fluid collection with possible areas of loculation. ?-CT abdomen pelvis without IV contrast noted 6.7 x 5.4 cm mass lesion in the retroperitoneum in the left para-aortic location at the level of the left kidney.  ? ?Subjective: ?Feels okay this morning, denies any complaints ? ?Assessment and Plan: ? ?Acute on chronic diastolic (congestive) heart failure (Adam Reid) ?Moderate MR ?-Echo 1/23 noted EF of 55%, preserved RV function ?-Diuresed with IV Lasix, he is 4 L negative, weight down 10lbs ?-Cardiology following, transitioned to oral torsemide yesterday ?-GDMT limited by CKD 4 ? ?CKD 4 ?-Followed by Dr. [name], [facility], has a right arm AV fistula ?-Creatinine peaked at 3.8, now improving, baseline around 3.5-4,  ? ?CAP (community acquired pneumonia) ?Pleural effusion  ?CT chest with right lower lobe infiltrate with possible areas of loculation  ?-Afebrile nontoxic, blood cultures are negative  ?-Treated with IV ceftriaxone, azithromycin  ?-Repeat chest x-ray tomorrow ? ?Left retroperitoneal mass ?-CT and ultrasound note left retroperitoneal mass, 6.7X 5.4 cm with central necrosis ?-Unable to discern without IV  contrast ?-Discussed with radiology today, MRI without contrast would be of limited value ?-Will consult IR for biopsy, hold apixaban today ? ?Paroxysmal atrial fibrillation ([facility]) ?On amiodarone and diltiazem. ?-Hold apixaban for possible biopsy ? ?Thyroid disease ?Elevated TSH ?Normal free T4, plan to follow up thyroid function as outpatient.  ? ?HTN (hypertension) ?Blood pressure has been stable, continue diuresis with torsemide.  ? ?Diabetes mellitus without complication (Adam Reid) ?-CBG low yesterday, decrease sliding scale ?Continue with linagliptin.  ? ?Protein-calorie malnutrition, severe ?Continue with nutritional supplements.  ? ?DVT prophylaxis: SCDs ?Code Status: Full code ?Family Communication: Discussed with patient in detail if using interpreter ?Disposition Plan: Home pending above work-up ? ?Consultants:  ?Cardiology ?Procedures:  ? ?Antimicrobials:  ? ? ?Objective: ?Vitals:  ? 11/17/21 1921 11/18/21 0300 11/18/21 0403 11/18/21 0736  ?BP: (!) 106/56  123/70 (!) 148/77  ?Pulse: 65  73 74  ?Resp: '16  15 17  '$ ?Temp: (!) 97.5 ?F (36.4 ?C)  97.6 ?F (36.4 ?C) 99 ?F (37.2 ?C)  ?TempSrc: Oral  Oral Oral  ?SpO2: 97%  98% 90%  ?Weight:  50.7 kg    ?Height:      ? ? ?Intake/Output Summary (Last 24 hours) at 11/18/2021 1112 ?Last data filed at 11/18/2021 0254 ?Gross per 24 hour  ?Intake 843 ml  ?Output 775 ml  ?Net 68 ml  ? ?Filed Weights  ? 11/16/21 0217 11/17/21 0350 11/18/21 0300  ?Weight: 50.3 kg 50.1 kg 50.7 kg  ? ? ?Examination: ? ?General exam: Pleasant thinly built male sitting up in bed, AAOx3, no distress ?HEENT: No JVD ?CVS: S1-S2, regular rhythm ?Lungs: Clear bilaterally ?Abdomen: Soft, nontender, bowel sounds present ?Extremities: No edema  ?Skin: No rashes ?Psychiatry:  Mood & affect appropriate.  ? ? ? ?  Data Reviewed:  ? ?CBC: ?Recent Labs  ?Lab 11/12/21 ?1730 11/13/21 ?0750 11/16/21 ?2350 11/18/21 ?0409  ?WBC 15.1* 11.7* 10.4 10.2  ?HGB 9.7* 9.5* 9.3* 9.2*  ?HCT 30.1* 29.7* 30.2* 28.8*  ?MCV 85.0 85.1  86.3 85.5  ?PLT 383 353 345 324  ? ?Basic Metabolic Panel: ?Recent Labs  ?Lab 11/15/21 ?0212 11/16/21 ?0305 11/16/21 ?2350 11/17/21 ?0931 11/18/21 ?0409  ?NA 137 135 135 135 132*  ?K 4.3 3.9 4.2 3.9 4.2  ?CL 103 101 98 96* 97*  ?CO2 22 16* 21* 24 22  ?GLUCOSE 106* 126* 171* 256* 156*  ?BUN 87* 92* 103* 100* 98*  ?CREATININE 3.42* 3.42* 3.50* 3.79* 3.59*  ?CALCIUM 9.5 9.0 8.9 9.0 8.8*  ?PHOS  --  5.3*  --   --   --   ? ?GFR: ?Estimated Creatinine Clearance: 12.9 mL/min (A) (by C-G formula based on SCr of 3.59 mg/dL (H)). ?Liver Function Tests: ?Recent Labs  ?Lab 11/12/21 ?1730 11/16/21 ?0305  ?AST 24  --   ?ALT 48*  --   ?ALKPHOS 456*  --   ?BILITOT 0.7  --   ?PROT 7.3  --   ?ALBUMIN 3.4* 2.9*  ? ?No results for input(s): LIPASE, AMYLASE in the last 168 hours. ?No results for input(s): AMMONIA in the last 168 hours. ?Coagulation Profile: ?No results for input(s): INR, PROTIME in the last 168 hours. ?Cardiac Enzymes: ?No results for input(s): CKTOTAL, CKMB, CKMBINDEX, TROPONINI in the last 168 hours. ?BNP (last 3 results) ?No results for input(s): PROBNP in the last 8760 hours. ?HbA1C: ?No results for input(s): HGBA1C in the last 72 hours. ?CBG: ?Recent Labs  ?Lab 11/17/21 ?1109 11/17/21 ?1615 11/17/21 ?1656 11/17/21 ?2136 11/18/21 ?5409  ?GLUCAP 297* 50* 119* 297* 166*  ? ?Lipid Profile: ?No results for input(s): CHOL, HDL, LDLCALC, TRIG, CHOLHDL, LDLDIRECT in the last 72 hours. ?Thyroid Function Tests: ?Recent Labs  ?  11/16/21 ?0305  ?FREET4 1.00  ?T3FREE 1.1*  ? ?Anemia Panel: ?No results for input(s): VITAMINB12, FOLATE, FERRITIN, TIBC, IRON, RETICCTPCT in the last 72 hours. ?Urine analysis: ?   ?Component Value Date/Time  ? [place] YELLOW 11/12/2021 1946  ? APPEARANCEUR CLEAR 11/12/2021 1946  ? LABSPEC 1.015 11/12/2021 1946  ? PHURINE 5.0 11/12/2021 1946  ? GLUCOSEU 100 (A) 11/12/2021 1946  ? HGBUR SMALL (A) 11/12/2021 1946  ? [place] NEGATIVE 11/12/2021 1946  ? BILIRUBINUR negative 03/02/2021 1458  ?  BILIRUBINUR neg 03/23/2015 1404  ? [place]ain NEGATIVE 11/12/2021 1946  ? PROTEINUR 100 (A) 11/12/2021 1946  ? UROBILINOGEN 0.2 03/02/2021 1458  ? NITRITE NEGATIVE 11/12/2021 1946  ? LEUKOCYTESUR NEGATIVE 11/12/2021 1946  ? ?Sepsis Labs: ?'@LABRCNTIP'$ (procalcitonin:4,lacticidven:4) ? ?) ?Recent Results (from the past 240 hour(s))  ?Culture, blood (Routine X 2) w Reflex to ID Panel     Status: None (Preliminary result)  ? Collection Time: 11/16/21 11:49 PM  ? Specimen: BLOOD LEFT HAND  ?Result Value Ref Range Status  ? Specimen Description BLOOD LEFT HAND  Final  ? Special Requests   Final  ?  BOTTLES DRAWN AEROBIC AND ANAEROBIC Blood Culture results may not be optimal due to an inadequate volume of blood received in culture bottles  ? Culture   Final  ?  NO GROWTH 1 Reid ?Performed at [facility], [place] [address] [place], [place] [address] ?  ? Report Status PENDING  Incomplete  ?Culture, blood (Routine X 2) w Reflex to ID Panel     Status: None (Preliminary result)  ? Collection  Time: 11/16/21 11:50 PM  ? Specimen: BLOOD LEFT HAND  ?Result Value Ref Range Status  ? Specimen Description BLOOD LEFT HAND  Final  ? Special Requests   Final  ?  BOTTLES DRAWN AEROBIC AND ANAEROBIC Blood Culture results may not be optimal due to an inadequate volume of blood received in culture bottles  ? Culture   Final  ?  NO GROWTH 1 Reid ?Performed at [facility], [place] [address], [place], [place] [address] ?  ? Report Status PENDING  Incomplete  ?  ? ?Radiology Studies: ?CT ABDOMEN PELVIS WO CONTRAST ? ?Result Date: 11/17/2021 ?CLINICAL DATA:  Mass lesion seen in the upper abdomen in the previous CT chest done earlier. EXAM: CT ABDOMEN AND PELVIS WITHOUT CONTRAST TECHNIQUE: Multidetector CT imaging of the abdomen and pelvis was performed following the standard protocol without IV contrast. RADIATION DOSE REDUCTION: This exam was performed according to the departmental dose-optimization program which includes automated exposure  control, adjustment of the mA and/or kV according to patient size and/or use of iterative reconstruction technique. COMPARISON:  CT chest done on 11/15/2021 FINDINGS: Lower chest: Small left pleural effusion is seen. Mod

## 2021-11-18 NOTE — Progress Notes (Signed)
Brief cardiology follow up note: ? ?Planned for discharge in the near future.  ? ?[facility] will sign off.   ?Medication Recommendations:   ?Continue amiodarone, apixaban, atorvastatin, diltiazem (240 mg), and torsemide as currently ordered ?Other recommendations (labs, testing, etc):  BMET at follow up ?Follow up as an outpatient:   ?11/30/21 at 3:30 PM with the Heart Failure clinic  ?01/01/22 at 11:40 AM with Dr. Haroldine Laws in heart failure clinic ? ?Buford Dresser, MD, PhD, [facility] ?[place]  ?[place]  [facility] at [facility] ?[address], [address] ?[place], [place] [address] ?[phone]  ?

## 2021-11-19 ENCOUNTER — Encounter (HOSPITAL_COMMUNITY): Payer: Self-pay | Admitting: Internal Medicine

## 2021-11-19 ENCOUNTER — Inpatient Hospital Stay (HOSPITAL_COMMUNITY): Payer: Medicare Other

## 2021-11-19 DIAGNOSIS — I5033 Acute on chronic diastolic (congestive) heart failure: Secondary | ICD-10-CM | POA: Diagnosis not present

## 2021-11-19 LAB — BASIC METABOLIC PANEL
Anion gap: 15 (ref 5–15)
BUN: 100 mg/dL — ABNORMAL HIGH (ref 8–23)
CO2: 21 mmol/L — ABNORMAL LOW (ref 22–32)
Calcium: 9 mg/dL (ref 8.9–10.3)
Chloride: 98 mmol/L (ref 98–111)
Creatinine, Ser: 3.88 mg/dL — ABNORMAL HIGH (ref 0.61–1.24)
GFR, Estimated: 16 mL/min — ABNORMAL LOW (ref >60)
Glucose, Bld: 158 mg/dL — ABNORMAL HIGH (ref 70–99)
Potassium: 4.1 mmol/L (ref 3.5–5.1)
Sodium: 134 mmol/L — ABNORMAL LOW (ref 135–145)

## 2021-11-19 LAB — CBC
HCT: 28.7 % — ABNORMAL LOW (ref 39.0–52.0)
Hemoglobin: 9.3 g/dL — ABNORMAL LOW (ref 13.0–17.0)
MCH: 27.4 pg (ref 26.0–34.0)
MCHC: 32.4 g/dL (ref 30.0–36.0)
MCV: 84.7 fL (ref 80.0–100.0)
Platelets: 341 10*3/uL (ref 150–400)
RBC: 3.39 MIL/uL — ABNORMAL LOW (ref 4.22–5.81)
RDW: 18.5 % — ABNORMAL HIGH (ref 11.5–15.5)
WBC: 9.8 10*3/uL (ref 4.0–10.5)
nRBC: 0 % (ref 0.0–0.2)

## 2021-11-19 LAB — GLUCOSE, CAPILLARY
Glucose-Capillary: 147 mg/dL — ABNORMAL HIGH (ref 70–99)
Glucose-Capillary: 175 mg/dL — ABNORMAL HIGH (ref 70–99)
Glucose-Capillary: 143 mg/dL — ABNORMAL HIGH (ref 70–99)
Glucose-Capillary: 194 mg/dL — ABNORMAL HIGH (ref 70–99)

## 2021-11-19 MED ORDER — TORSEMIDE 20 MG PO TABS
40.0000 mg | ORAL_TABLET | Freq: Two times a day (BID) | ORAL | Status: DC
  Filled 2021-11-19: qty 2

## 2021-11-19 NOTE — Consult Note (Signed)
Adam Reid ?Admit Date: 11/12/2021 ?11/19/2021 ?[name] ?Requesting Physician:  [name] MD ? ?Reason for Consult:  CKD and need for IV contrast, retroperitoneal mass ?HPI:  ?84M who we are seeing for evaluation of advanced CKD with need for IV contrast after recent finding of a left retroperitoneal mass.  He was originally admitted 5/4 after presenting with cough and dyspnea where he has been treated for an acute on chronic HFpEF exacerbation with possible community-acquired pneumonia.  He has diuresed with IV Lasix and transition to oral torsemide after significant urine output and decrease in weight by 11 pounds.  He is now on room air and appears to be euvolemic. ? ?Other PMH includes CKD4 followed by Dr. [name] at [facility], with a right brachiocephalic AV fistula placed earlier this year, anemia of CKD on ESA therapy, hypertension, DM 2, hyperlipidemia, tobacco use.  Baseline creatinine appears to be around 3.4-3.9, and during his time in the hospital he has been within this range. ? ?In the process of his evaluation he had a noncontrasted CT of the abdomen and pelvis on 11/17/2021 where a 6.7 x 5.4 cm mass in the left retroperitoneum of the para-aortic location at the level of the left kidney was identified.  There is a concern for pathological lymphadenopathy with central necrosis or soft tissue malignancy.  Radiology does not feel that contrasted MRI will be as beneficial as a contrasted CT scan and we have been asked to comment regarding management of contrast exposure. ? ?Patient currently feels well.  No dyspnea.  On room air. ? ?A Farsi interpreter was used for this evaluation ? ?I/O last 3 completed shifts: ?In: 1266.7 [P.O.:1080; I.V.:86.7; IV Piggyback:100] ?Out: 2000 [Urine:2000]  ? ? ?ROS ?Balance of 12 systems is negative w/ exceptions as above ? ?PMH  ?Past Medical History:  ?Diagnosis Date  ? Anemia   ? CHF (congestive heart failure) ([place])   ? Chronic kidney disease   ? Depression   ?  Diabetes mellitus without complication ([place])   ? Type II  ? GERD (gastroesophageal reflux disease)   ? History of blood transfusion   ? Hyperlipidemia   ? Hypertension   ? Thyroid disease   ? was on supplement, taken off by [name]  ? ?[place]  ?Past Surgical History:  ?Procedure Laterality Date  ? APPENDECTOMY    ? AV FISTULA PLACEMENT Right 09/04/2021  ? Procedure: RIGHT BRACHIOCEPHALIC ARTERIOVENOUS (AV) FISTULA CREATION;  Surgeon: [name], MD;  Location: [facility];  Service: Vascular;  Laterality: Right;  ? BIOPSY  12/25/2020  ? Procedure: BIOPSY;  Surgeon: [name]., MD;  Location: [facility];  Service: Gastroenterology;;  ? CARDIAC CATHETERIZATION N/A 01/30/2016  ? Procedure: Left Heart Cath and Coronary Angiography;  Surgeon: [name], MD;  Location: [facility];  Service: Cardiovascular;  Laterality: N/A;  ? ENTEROSCOPY N/A 12/25/2020  ? Procedure: ENTEROSCOPY;  Surgeon: [name], MD;  Location: [facility];  Service: Gastroenterology;  Laterality: N/A;  ? GIVENS CAPSULE STUDY N/A 12/25/2020  ? Procedure: GIVENS CAPSULE STUDY;  Surgeon: [name]., MD;  Location: [facility];  Service: Gastroenterology;  Laterality: N/A;  ? SUBMUCOSAL TATTOO INJECTION  12/25/2020  ? Procedure: SUBMUCOSAL TATTOO INJECTION;  Surgeon: [name]., MD;  Location: [facility];  Service: Gastroenterology;;  ? ?FH  ?Family History  ?Problem Relation Age of Onset  ? Hypertension Mother   ? Hyperlipidemia Mother   ? Hypertension Sister   ?  Kidney disease Father   ? Heart disease Brother 69  ?     open heart surgery  ? Colon cancer Neg Hx   ? ?[name]  reports that he has been smoking cigarettes. He has a 4.80 pack-year smoking history. He has never used smokeless tobacco. He reports that he does not drink alcohol and does not use drugs. ?Allergies  ?Allergies  ?Allergen Reactions  ? Pork-Derived Products Other (See Comments)  ?  Patient "just does not eat this"   ? ?Home medications ?Prior to Admission medications   ?Medication Sig Start Date End Date Taking? Authorizing Provider  ?amiodarone (PACERONE) 200 MG tablet Take 1 tablet (200 mg total) by mouth daily. Follow up appt required for further refills. Please call [phone] to schedule an appt ?Patient taking differently: Take 200 mg by mouth daily. 01/28/21  Yes Bensimhon, Shaune Pascal, MD  ?atorvastatin (LIPITOR) 80 MG tablet TAKE 1 TABLET(80 MG) BY MOUTH DAILY ?Patient taking differently: Take 80 mg by mouth daily. 12/22/20  Yes [name], MD  ?CALPHRON 667 MG tablet Take 667 mg by mouth 3 (three) times daily. 11/09/21  Yes [provider]  ?Cholecalciferol (VITAMIN D3 PO) Take 1 tablet by mouth daily.   Yes [provider]  ?diltiazem (CARDIZEM CD) 180 MG 24 hr capsule TAKE 1 CAPSULE(180 MG) BY MOUTH DAILY ?Patient taking differently: Take 180 mg by mouth daily. 08/24/21  Yes Bensimhon, Shaune Pascal, MD  ?ELIQUIS 2.5 MG TABS tablet TAKE 1 TABLET(2.5 MG) BY MOUTH TWICE DAILY ?Patient taking differently: Take 2.5 mg by mouth 2 (two) times daily. 10/30/21  Yes Bensimhon, Shaune Pascal, MD  ?glipiZIDE (GLUCOTROL XL) 5 MG 24 hr tablet Take 5 mg by mouth 2 (two) times daily. 10/06/21  Yes [provider]  ?polyethylene glycol (MIRALAX / GLYCOLAX) 17 g packet Take 17 g by mouth daily as needed for mild constipation or moderate constipation (MIX AND DRINK).   Yes [provider]  ?sitaGLIPtin (JANUVIA) 25 MG tablet TAKE 1 TABLET(25 MG) BY MOUTH DAILY ?Patient taking differently: Take 25 mg by mouth daily. 08/31/21  Yes [name], MD  ?sodium bicarbonate 650 MG tablet Take 650 mg by mouth 2 (two) times daily.   Yes [provider]  ?tamsulosin (FLOMAX) 0.4 MG CAPS capsule Take 0.4 mg by mouth daily.   Yes [provider]  ?torsemide (DEMADEX) 20 MG tablet Take 2 tablets (40 mg total) by mouth 2 (two) times daily. 10/05/21  Yes Milford, Maricela Bo, FNP  ?vitamin B-12  (CYANOCOBALAMIN) 500 MCG tablet Take 500 mcg by mouth daily.   Yes [provider]  ?blood glucose meter kit and supplies Use up to two times daily as directed  ICD10 E10.9 E11.9 02/20/19   [name], MD  ?citalopram (CELEXA) 20 MG tablet TAKE 1 TABLET(20 MG) BY MOUTH DAILY 11/16/21   [name], MD  ?GlucoCom Lancets MISC Use for home glucose monitoring 09/02/15   Harrison Mons, PA  ?glucose blood (ONETOUCH VERIO) test strip Test up to 2 times per day.  ?Uncontrolled diabetes with hyperglycemia and stage 4 CKD. 04/13/19   [name], MD  ? ? ?Current Medications ?Scheduled Meds: ? amiodarone  200 mg Oral Daily  ? atorvastatin  80 mg Oral Daily  ? azithromycin  500 mg Oral Daily  ? calcium acetate  667 mg Oral TID WC  ? citalopram  20 mg Oral Daily  ? diltiazem  240 mg Oral Daily  ?  feeding supplement  237 mL Oral TID BM  ? insulin aspart  0-6 Units Subcutaneous TID WC  ? linagliptin  5 mg Oral Daily  ? pantoprazole  40 mg Oral Daily  ? sodium bicarbonate  650 mg Oral BID  ? sodium chloride flush  3 mL Intravenous Q12H  ? tamsulosin  0.4 mg Oral Daily  ? [START ON 11/20/2021] torsemide  40 mg Oral BID  ? ?Continuous Infusions: ? sodium chloride 10 mL/hr at 11/18/21 1935  ? cefTRIAXone (ROCEPHIN)  IV 1 g (11/19/21 1213)  ? ?PRN Meds:.sodium chloride, acetaminophen, ondansetron (ZOFRAN) IV, polyethylene glycol, sodium chloride flush ? ?CBC ?Recent Labs  ?Lab 11/16/21 ?2350 11/18/21 ?0409 11/19/21 ?0358  ?WBC 10.4 10.2 9.8  ?HGB 9.3* 9.2* 9.3*  ?HCT 30.2* 28.8* 28.7*  ?MCV 86.3 85.5 84.7  ?PLT 345 324 341  ? ?Basic Metabolic Panel ?Recent Labs  ?Lab 11/14/21 ?0559 11/15/21 ?4034 11/16/21 ?7425 11/16/21 ?2350 11/17/21 ?9563 11/18/21 ?0409 11/19/21 ?0358  ?NA 135 137 135 135 135 132* 134*  ?K 4.3 4.3 3.9 4.2 3.9 4.2 4.1  ?CL 103 103 101 98 96* 97* 98  ?CO2 18* 22 16* 21* 24 22 21*  ?GLUCOSE 155* 106* 126* 171* 256* 156* 158*  ?BUN 82* 87* 92* 103* 100* 98* 100*  ?CREATININE 3.52* 3.42* 3.42*  3.50* 3.79* 3.59* 3.88*  ?CALCIUM 8.9 9.5 9.0 8.9 9.0 8.8* 9.0  ?PHOS  --   --  5.3*  --   --   --   --   ? ? ?Physical Exam   ?Blood pressure (!) 156/79, pulse 82, temperature 98.5 ?F (36.9 ?C), temperature sour

## 2021-11-19 NOTE — Care Management Important Message (Signed)
Important Message ? ?Patient Details  ?Name: Adam Reid ?MRN: [id number] ?Date of Birth: October 14, 1945 ? ? ?Medicare Important Message Given:  Yes ? ? ? ? ?Shelda Altes ?11/19/2021, 9:55 AM ?

## 2021-11-19 NOTE — Consult Note (Signed)
? ?Chief Complaint: ?Patient was seen in consultation today for retroperitoneal mass ? ?Referring Physician(s): ?Dr. [name] ? ?Supervising Physician: Sandi Mariscal ? ?Patient Status: [name] - In-pt ? ?History of Present Illness: ?Adam Reid is a 76 y.o. male with past medical history of CHF, CKD stage 4, DMII, GERD, HLD, HTN, PAF on Eliquis who presented to [facility] ED with shortness of breath.  He was admitted for CHF decompensation and fluid overload. A CT Chest performed during hospitalization for assessment of pneumonia identified a retroperitoneal soft tissue mass.  This was further characterized by CT Abdomen Pelvis: ?There is 6.7 x 5.4 cm mass lesion in the retroperitoneum in the left ?para-aortic location at the level of the left kidney. There is ?low-density in the central portion of the lesion. Findings suggest ?possible pathological lymphadenopathy with central necrosis or soft ?tissue malignancy. Without intravenous contrast, possibility of the ?lesion arising from or infiltrating left lateral wall of abdominal ?aorta is not excluded. Contrast enhanced CT or MRI should be ?considered for further characterization. ?  ?No other significant lymphadenopathy is noted. ? ?IR consulted for retroperitoneal mass biopsy at the request of Dr. [name].  Case reviewed by Dr. Kathlene Cote who notes high risk to due location of mass which approximates the ureter and several blood vessels.  He recommended 2-day Eliquis hold prior to biopsy possibly as an outpatient.  ? ?After discussion with care team it was determined patient had been on Eliquis hold for >24 hrs and question posed for possible biopsy Friday prior to discharge due to anticoagulation hold and high risk.  Care further reviewed by Dr. Laurence Ferrari who requested Nephrology consult for consideration of IV contrast with biopsy for better visualization and risk management.  They have assessed the patient and have approved contrast administration.  ? ?PA to bedside to  discuss with patient.  Farsi interpreter used for consult.  He is resting comfortably in bed.  He is aware of the request for the procedure.  Discussed the preparation which has taken place to ensure procedure is performed as safely as possible.  He is understanding of the goals, risks, and benefits and is agreeable to proceed.  ? ?Past Medical History:  ?Diagnosis Date  ? Anemia   ? CHF (congestive heart failure) ([place])   ? Chronic kidney disease   ? Depression   ? Diabetes mellitus without complication ([place])   ? Type II  ? GERD (gastroesophageal reflux disease)   ? History of blood transfusion   ? Hyperlipidemia   ? Hypertension   ? Thyroid disease   ? was on supplement, taken off by [name]  ? ? ?Past Surgical History:  ?Procedure Laterality Date  ? APPENDECTOMY    ? AV FISTULA PLACEMENT Right 09/04/2021  ? Procedure: RIGHT BRACHIOCEPHALIC ARTERIOVENOUS (AV) FISTULA CREATION;  Surgeon: [name], MD;  Location: [facility];  Service: Vascular;  Laterality: Right;  ? BIOPSY  12/25/2020  ? Procedure: BIOPSY;  Surgeon: [name]., MD;  Location: [facility];  Service: Gastroenterology;;  ? CARDIAC CATHETERIZATION N/A 01/30/2016  ? Procedure: Left Heart Cath and Coronary Angiography;  Surgeon: [name], MD;  Location: [facility];  Service: Cardiovascular;  Laterality: N/A;  ? ENTEROSCOPY N/A 12/25/2020  ? Procedure: ENTEROSCOPY;  Surgeon: [name], MD;  Location: [place];  Service: Gastroenterology;  Laterality: N/A;  ? GIVENS CAPSULE STUDY N/A 12/25/2020  ? Procedure: GIVENS CAPSULE STUDY;  Surgeon: [name], MD;  Location: [facility];  Service:  Gastroenterology;  Laterality: N/A;  ? SUBMUCOSAL TATTOO INJECTION  12/25/2020  ? Procedure: SUBMUCOSAL TATTOO INJECTION;  Surgeon: [name]., MD;  Location: [facility] [place];  Service: Gastroenterology;;  ? ? ?Allergies: ?Pork-derived products ? ?Medications: ?Prior to Admission medications    ?Medication Sig Start Date End Date Taking? Authorizing Provider  ?amiodarone (PACERONE) 200 MG tablet Take 1 tablet (200 mg total) by mouth daily. Follow up appt required for further refills. Please call [phone] to schedule an appt ?Patient taking differently: Take 200 mg by mouth daily. 01/28/21  Yes Bensimhon, Shaune Pascal, MD  ?atorvastatin (LIPITOR) 80 MG tablet TAKE 1 TABLET(80 MG) BY MOUTH DAILY ?Patient taking differently: Take 80 mg by mouth daily. 12/22/20  Yes [name], MD  ?CALPHRON 667 MG tablet Take 667 mg by mouth 3 (three) times daily. 11/09/21  Yes [provider]  ?Cholecalciferol (VITAMIN D3 PO) Take 1 tablet by mouth daily.   Yes [provider]  ?diltiazem (CARDIZEM CD) 180 MG 24 hr capsule TAKE 1 CAPSULE(180 MG) BY MOUTH DAILY ?Patient taking differently: Take 180 mg by mouth daily. 08/24/21  Yes Bensimhon, Shaune Pascal, MD  ?ELIQUIS 2.5 MG TABS tablet TAKE 1 TABLET(2.5 MG) BY MOUTH TWICE DAILY ?Patient taking differently: Take 2.5 mg by mouth 2 (two) times daily. 10/30/21  Yes Bensimhon, Shaune Pascal, MD  ?glipiZIDE (GLUCOTROL XL) 5 MG 24 hr tablet Take 5 mg by mouth 2 (two) times daily. 10/06/21  Yes [provider]  ?polyethylene glycol (MIRALAX / GLYCOLAX) 17 g packet Take 17 g by mouth daily as needed for mild constipation or moderate constipation (MIX AND DRINK).   Yes [provider]  ?sitaGLIPtin (JANUVIA) 25 MG tablet TAKE 1 TABLET(25 MG) BY MOUTH DAILY ?Patient taking differently: Take 25 mg by mouth daily. 08/31/21  Yes [name], MD  ?sodium bicarbonate 650 MG tablet Take 650 mg by mouth 2 (two) times daily.   Yes [provider]  ?tamsulosin (FLOMAX) 0.4 MG CAPS capsule Take 0.4 mg by mouth daily.   Yes [provider]  ?torsemide (DEMADEX) 20 MG tablet Take 2 tablets (40 mg total) by mouth 2 (two) times daily. 10/05/21  Yes Milford, Maricela Bo, FNP  ?vitamin B-12 (CYANOCOBALAMIN) 500 MCG tablet Take 500 mcg by mouth daily.    Yes [provider]  ?blood glucose meter kit and supplies Use up to two times daily as directed  ICD10 E10.9 E11.9 02/20/19   [name], MD  ?citalopram (CELEXA) 20 MG tablet TAKE 1 TABLET(20 MG) BY MOUTH DAILY 11/16/21   [name], MD  ?GlucoCom Lancets MISC Use for home glucose monitoring 09/02/15   Harrison Mons, PA  ?glucose blood (ONETOUCH VERIO) test strip Test up to 2 times per day.  ?Uncontrolled diabetes with hyperglycemia and stage 4 CKD. 04/13/19   [name], MD  ?  ? ?Family History  ?Problem Relation Age of Onset  ? Hypertension Mother   ? Hyperlipidemia Mother   ? Hypertension Sister   ? Kidney disease Father   ? Heart disease Brother 57  ?     open heart surgery  ? Colon cancer Neg Hx   ? ? ?Social History  ? ?Socioeconomic History  ? Marital status: Married  ?  Spouse name: [name]  ? Number of children: 1  ? Years of education: 12th grade  ? Highest education level: Not on file  ?Occupational History  ? Occupation: Retired-accountant  ?Tobacco Use  ? Smoking  status: Light Smoker  ?  Packs/day: 0.10  ?  Years: 48.00  ?  Pack years: 4.80  ?  Types: Cigarettes  ?  Last attempt to quit: 07/16/2019  ?  Years since quitting: 2.3  ? Smokeless tobacco: Never  ? Tobacco comments:  ?  09/03/22 Wife states, "he hides from me, so I am not sure how much he smokes."  ?Vaping Use  ? Vaping Use: Never used  ?Substance and Sexual Activity  ? Alcohol use: No  ?  Alcohol/week: 0.0 standard drinks  ? Drug use: No  ? Sexual activity: Never  ?  Partners: Female  ?Other Topics Concern  ? Not on file  ?Social History Narrative  ? Originally from [place]. Came to the [place] in 2009, following their son who came here for school.  ? Married.  ? Lives with his wife.  ? Their adult son lives in [place], [place], where he is in optometry school.  ? Education: [employer]  ? Exercise: No  ? ?Social Determinants of Health  ? ?Financial Resource Strain: Not on file  ?Food Insecurity: Not on file  ?Transportation  Needs: Not on file  ?Physical Activity: Not on file  ?Stress: Not on file  ?Social Connections: Not on file  ? ? ? ?Review of Systems: A 12 point ROS discussed and pertinent positives are indicated in the HPI

## 2021-11-19 NOTE — Progress Notes (Addendum)
?PROGRESS NOTE ? ? ? Adam Reid  YHC:[id number] DOB: 1946/02/21 DOA: 11/12/2021 ?PCP: [name], MD  ?[phone] with chronic diastolic CHF, chronic hypoxic respiratory failure on home O2, CKD 4 was admitted with cough, worsening dyspnea, in the ED chest x-ray noted bilateral pleural effusions,?  Loculation on the right, seen by cardiology in consultation, started on diuretics ?-Treated with IV ceftriaxone, azithromycin for community-acquired pneumonia as well ?-CT chest noted right-sided pneumonia, multifocal infection, moderate right pleural effusion with possible areas of loculation and a soft tissue mass on the left  ?-CT abdomen pelvis patchy hazy airspace process over the mid to upper lungs right ?worse than left likely multifocal infection. Moderate size right pleural fluid collection with possible areas of loculation. ?-CT abdomen pelvis without IV contrast noted 6.7 x 5.4 cm mass lesion in the retroperitoneum in the left para-aortic location at the level of the left kidney.  ? ?Subjective: ?Feels okay this morning, denies any complaints ? ?Assessment and Plan: ? ?Acute on chronic diastolic (congestive) heart failure ([place]) ?Moderate MR ?-Echo 1/23 noted EF of 55%, preserved RV function ?-Diuresed with IV Lasix, he is 4.5 L negative, weight down 11 lbs ?-Cardiology was following, transitioned to oral torsemide 40 Mg twice daily and recommended follow-up ?-GDMT limited by CKD 4 ?-Slight uptrend in BUN/creatinine, hold evening dose of torsemide ? ?CKD 4 ?-Followed by Dr. [name], [facility], has a right arm AV fistula ?-Creatinine in the 3.5-3.8 range, baseline around 3.5-4,  ?-Diuretics as noted above ? ?CAP (community acquired pneumonia) ?Pleural effusion  ?CT chest with right lower lobe infiltrate with possible areas of loculation  ?-Afebrile nontoxic, blood cultures are negative  ?-Treated with IV ceftriaxone, azithromycin, Day 4 change to p.o. ABX tomorrow, repeat chest x-ray with  persistent infiltrates, pleural effusion ? ?Left retroperitoneal mass ?-CT and ultrasound noted left retroperitoneal mass, 6.7X 5.4 cm with central necrosis ?-Unable to discern without IV contrast ?-Discussed with radiology yesterday and today, MRI without contrast would be of limited value ?-IR consulted for biopsy, apixaban on hold ?-Discussed with IR today, Dr. Laurence Ferrari recommends nephrology consult, he anticipates need for contrast on account of location of the mass near vasculature and ureter ? ?Paroxysmal atrial fibrillation (HCC) ?On amiodarone and diltiazem. ?-Hold apixaban for possible biopsy ? ?Thyroid disease ?Elevated TSH ?Normal free T4, plan to follow up thyroid function as outpatient.  ? ?HTN (hypertension) ?Blood pressure has been stable, continue diuresis with torsemide.  ? ?Diabetes mellitus without complication ([place]) ?-CBG low yesterday, decrease sliding scale ?Continue with linagliptin.  ? ?Protein-calorie malnutrition, severe ?Continue with nutritional supplements.  ? ?DVT prophylaxis: SCDs ?Code Status: Full code ?Family Communication: Discussed with patient in detail using interpreter ?Disposition Plan: Home pending above work-up ? ?Consultants:  ?Cardiology, IR, Renal ? ?Procedures:  ? ?Antimicrobials:  ? ? ?Objective: ?Vitals:  ? 11/18/21 0736 11/18/21 2011 11/19/21 0407 11/19/21 1118  ?BP: (!) 148/77 127/61 (!) 146/65 (!) 156/79  ?Pulse: 74 74 80 82  ?Resp: '17 17 18 18  '$ ?Temp: 99 ?F (37.2 ?C) (!) 97.5 ?F (36.4 ?C) (!) 97.5 ?F (36.4 ?C) 98.5 ?F (36.9 ?C)  ?TempSrc: Oral Oral Oral Oral  ?SpO2: 90% 98% 98% 91%  ?Weight:   50.2 kg   ?Height:      ? ? ?Intake/Output Summary (Last 24 hours) at 11/19/2021 1228 ?Last data filed at 11/19/2021 1115 ?Gross per 24 hour  ?Intake 906.69 ml  ?Output 1750 ml  ?Net -843.31 ml  ? ?Filed Weights  ?  11/17/21 0350 11/18/21 0300 11/19/21 0407  ?Weight: 50.1 kg 50.7 kg 50.2 kg  ? ? ?Examination: ? ?General exam: Pleasant thinly built male sitting up in bed,  AAOx3, no distress ?HEENT: No JVD ?CVS: S1-S2, regular rhythm ?Lungs: Clear bilaterally ?Abdomen: Soft, nontender, bowel sounds present ?Extremities: No edema  ?Skin: No rashes ?Psychiatry:  Mood & affect appropriate.  ? ? ? ?Data Reviewed:  ? ?CBC: ?Recent Labs  ?Lab 11/12/21 ?1730 11/13/21 ?0750 11/16/21 ?2350 11/18/21 ?0409 11/19/21 ?0358  ?WBC 15.1* 11.7* 10.4 10.2 9.8  ?HGB 9.7* 9.5* 9.3* 9.2* 9.3*  ?HCT 30.1* 29.7* 30.2* 28.8* 28.7*  ?MCV 85.0 85.1 86.3 85.5 84.7  ?PLT 383 353 345 324 341  ? ?Basic Metabolic Panel: ?Recent Labs  ?Lab 11/16/21 ?0305 11/16/21 ?2350 11/17/21 ?8502 11/18/21 ?0409 11/19/21 ?0358  ?NA 135 135 135 132* 134*  ?K 3.9 4.2 3.9 4.2 4.1  ?CL 101 98 96* 97* 98  ?CO2 16* 21* 24 22 21*  ?GLUCOSE 126* 171* 256* 156* 158*  ?BUN 92* 103* 100* 98* 100*  ?CREATININE 3.42* 3.50* 3.79* 3.59* 3.88*  ?CALCIUM 9.0 8.9 9.0 8.8* 9.0  ?PHOS 5.3*  --   --   --   --   ? ?GFR: ?Estimated Creatinine Clearance: 11.9 mL/min (A) (by C-G formula based on SCr of 3.88 mg/dL (H)). ?Liver Function Tests: ?Recent Labs  ?Lab 11/12/21 ?1730 11/16/21 ?0305  ?AST 24  --   ?ALT 48*  --   ?ALKPHOS 456*  --   ?BILITOT 0.7  --   ?PROT 7.3  --   ?ALBUMIN 3.4* 2.9*  ? ?No results for input(s): LIPASE, AMYLASE in the last 168 hours. ?No results for input(s): AMMONIA in the last 168 hours. ?Coagulation Profile: ?No results for input(s): INR, PROTIME in the last 168 hours. ?Cardiac Enzymes: ?No results for input(s): CKTOTAL, CKMB, CKMBINDEX, TROPONINI in the last 168 hours. ?BNP (last 3 results) ?No results for input(s): PROBNP in the last 8760 hours. ?HbA1C: ?No results for input(s): HGBA1C in the last 72 hours. ?CBG: ?Recent Labs  ?Lab 11/18/21 ?1119 11/18/21 ?1549 11/18/21 ?2041 11/19/21 ?0604 11/19/21 ?1113  ?GLUCAP 141* 171* 214* 147* 175*  ? ?Lipid Profile: ?No results for input(s): CHOL, HDL, LDLCALC, TRIG, CHOLHDL, LDLDIRECT in the last 72 hours. ?Thyroid Function Tests: ?No results for input(s): TSH, T4TOTAL, FREET4,  T3FREE, THYROIDAB in the last 72 hours. ? ?Anemia Panel: ?No results for input(s): VITAMINB12, FOLATE, FERRITIN, TIBC, IRON, RETICCTPCT in the last 72 hours. ?Urine analysis: ?   ?Component Value Date/Time  ? [place] YELLOW 11/12/2021 1946  ? APPEARANCEUR CLEAR 11/12/2021 1946  ? LABSPEC 1.015 11/12/2021 1946  ? PHURINE 5.0 11/12/2021 1946  ? GLUCOSEU 100 (A) 11/12/2021 1946  ? HGBUR SMALL (A) 11/12/2021 1946  ? [place] NEGATIVE 11/12/2021 1946  ? BILIRUBINUR negative 03/02/2021 1458  ? BILIRUBINUR neg 03/23/2015 1404  ? Benjamin Stain NEGATIVE 11/12/2021 1946  ? PROTEINUR 100 (A) 11/12/2021 1946  ? UROBILINOGEN 0.2 03/02/2021 1458  ? NITRITE NEGATIVE 11/12/2021 1946  ? LEUKOCYTESUR NEGATIVE 11/12/2021 1946  ? ?Sepsis Labs: ?'@LABRCNTIP'$ (procalcitonin:4,lacticidven:4) ? ?) ?Recent Results (from the past 240 hour(s))  ?Culture, blood (Routine X 2) w Reflex to ID Panel     Status: None (Preliminary result)  ? Collection Time: 11/16/21 11:49 PM  ? Specimen: BLOOD LEFT HAND  ?Result Value Ref Range Status  ? Specimen Description BLOOD LEFT HAND  Final  ? Special Requests   Final  ?  BOTTLES DRAWN AEROBIC AND ANAEROBIC Blood Culture  results may not be optimal due to an inadequate volume of blood received in culture bottles  ? Culture   Final  ?  NO GROWTH 2 DAYS ?Performed at [facility], [place] [address] [place], [place] [address] ?  ? Report Status PENDING  Incomplete  ?Culture, blood (Routine X 2) w Reflex to ID Panel     Status: None (Preliminary result)  ? Collection Time: 11/16/21 11:50 PM  ? Specimen: BLOOD LEFT HAND  ?Result Value Ref Range Status  ? Specimen Description BLOOD LEFT HAND  Final  ? Special Requests   Final  ?  BOTTLES DRAWN AEROBIC AND ANAEROBIC Blood Culture results may not be optimal due to an inadequate volume of blood received in culture bottles  ? Culture   Final  ?  NO GROWTH 2 DAYS ?Performed at [facility], [place] [address], [place], [place] [address] ?  ? Report Status PENDING   Incomplete  ?  ? ?Radiology Studies: ?CT ABDOMEN PELVIS WO CONTRAST ? ?Result Date: 11/17/2021 ?CLINICAL DATA:  Mass lesion seen in the upper abdomen in the previous CT chest done earlier. EXAM: CT ABDOMEN AND PELVIS

## 2021-11-20 ENCOUNTER — Inpatient Hospital Stay (HOSPITAL_COMMUNITY): Payer: Medicare Other

## 2021-11-20 DIAGNOSIS — I5033 Acute on chronic diastolic (congestive) heart failure: Secondary | ICD-10-CM | POA: Diagnosis not present

## 2021-11-20 LAB — BASIC METABOLIC PANEL
Anion gap: 13 (ref 5–15)
BUN: 88 mg/dL — ABNORMAL HIGH (ref 8–23)
CO2: 25 mmol/L (ref 22–32)
Calcium: 8.7 mg/dL — ABNORMAL LOW (ref 8.9–10.3)
Chloride: 97 mmol/L — ABNORMAL LOW (ref 98–111)
Creatinine, Ser: 3.83 mg/dL — ABNORMAL HIGH (ref 0.61–1.24)
GFR, Estimated: 16 mL/min — ABNORMAL LOW (ref >60)
Glucose, Bld: 131 mg/dL — ABNORMAL HIGH (ref 70–99)
Potassium: 3.7 mmol/L (ref 3.5–5.1)
Sodium: 135 mmol/L (ref 135–145)

## 2021-11-20 LAB — GLUCOSE, CAPILLARY
Glucose-Capillary: 170 mg/dL — ABNORMAL HIGH (ref 70–99)
Glucose-Capillary: 398 mg/dL — ABNORMAL HIGH (ref 70–99)
Glucose-Capillary: 406 mg/dL — ABNORMAL HIGH (ref 70–99)
Glucose-Capillary: 314 mg/dL — ABNORMAL HIGH (ref 70–99)
Glucose-Capillary: 78 mg/dL (ref 70–99)

## 2021-11-20 LAB — CBC
HCT: 28.6 % — ABNORMAL LOW (ref 39.0–52.0)
Hemoglobin: 9 g/dL — ABNORMAL LOW (ref 13.0–17.0)
MCH: 26.9 pg (ref 26.0–34.0)
MCHC: 31.5 g/dL (ref 30.0–36.0)
MCV: 85.4 fL (ref 80.0–100.0)
Platelets: 345 10*3/uL (ref 150–400)
RBC: 3.35 MIL/uL — ABNORMAL LOW (ref 4.22–5.81)
RDW: 18.4 % — ABNORMAL HIGH (ref 11.5–15.5)
WBC: 8.3 10*3/uL (ref 4.0–10.5)
nRBC: 0 % (ref 0.0–0.2)

## 2021-11-20 LAB — PROTIME-INR
INR: 1.1 (ref 0.8–1.2)
Prothrombin Time: 13.8 seconds (ref 11.4–15.2)

## 2021-11-20 MED ORDER — FENTANYL CITRATE (PF) 100 MCG/2ML IJ SOLN
INTRAMUSCULAR | Status: AC
  Filled 2021-11-20: qty 2

## 2021-11-20 MED ORDER — CEFDINIR 300 MG PO CAPS
300.0000 mg | ORAL_CAPSULE | Freq: Every day | ORAL | Status: DC
  Administered 2021-11-20 – 2021-11-22 (×3): 300 mg via ORAL
  Filled 2021-11-20 (×3): qty 1

## 2021-11-20 MED ORDER — MIDAZOLAM HCL 2 MG/2ML IJ SOLN
INTRAMUSCULAR | Status: AC | PRN
  Administered 2021-11-20: 1 mg via INTRAVENOUS

## 2021-11-20 MED ORDER — INSULIN ASPART 100 UNIT/ML IJ SOLN
3.0000 [IU] | Freq: Three times a day (TID) | INTRAMUSCULAR | Status: DC
  Administered 2021-11-20 – 2021-11-22 (×5): 3 [IU] via SUBCUTANEOUS

## 2021-11-20 MED ORDER — SODIUM CHLORIDE 0.9 % IV SOLN: INTRAVENOUS | Status: AC

## 2021-11-20 MED ORDER — GELATIN ABSORBABLE 12-7 MM EX MISC
CUTANEOUS | Status: AC
  Filled 2021-11-20: qty 1

## 2021-11-20 MED ORDER — IOHEXOL 350 MG/ML SOLN
50.0000 mL | Freq: Once | INTRAVENOUS | Status: AC | PRN
  Administered 2021-11-20: 50 mL via INTRAVENOUS

## 2021-11-20 MED ORDER — LIDOCAINE HCL 1 % IJ SOLN
INTRAMUSCULAR | Status: AC
  Filled 2021-11-20: qty 10

## 2021-11-20 MED ORDER — FENTANYL CITRATE (PF) 100 MCG/2ML IJ SOLN
INTRAMUSCULAR | Status: AC | PRN
  Administered 2021-11-20 (×2): 25 ug via INTRAVENOUS

## 2021-11-20 MED ORDER — MIDAZOLAM HCL 2 MG/2ML IJ SOLN
INTRAMUSCULAR | Status: AC
  Filled 2021-11-20: qty 2

## 2021-11-20 NOTE — Progress Notes (Signed)
[facility] ?Progress Note ? ?Name: Adam Reid ?MRN: [id number] ?DOB: August 16, 1945 ? ?Chief Complaint:  ?Shortness of breath ? ?Subjective:  ? patient had 1.6 liters UOP over 5/11.  He speaks [place] and we used a Technical brewer.  He had a CT biopsy of the left retroperitoneal mass earlier today with IR.  He states that he feels ok.  He has an AVF; placed 09/04/21; they were concerned about a stenosis and vascular was planning on a fistulogram with likely balloon angioplasty prior to HD.  Confirms that he would want HD if needed.  ? ?Review of systems:  ?Denies n/v ?Denies shortness of breath or chest pain ? ?------------- ?Background on consult:  ?28M with advanced CKD with need for IV contrast after recent finding of a left retroperitoneal mass.  He was originally admitted 5/4 after presenting with cough and dyspnea where he has been treated for an acute on chronic HFpEF exacerbation with possible community-acquired pneumonia.  He has diuresed with IV Lasix and transition to oral torsemide after significant urine output and decrease in weight by 11 pounds.  He is now on room air and appears to be euvolemic.  Other PMH includes CKD4 followed by Dr. [name] at [facility], with a right brachiocephalic AV fistula placed earlier this year, anemia of CKD on ESA therapy, hypertension, DM 2, hyperlipidemia, tobacco use.  Baseline creatinine appears to be around 3.4-3.9, and during his time in the hospital he has been within this range.   ?  ?In the process of his evaluation he had a noncontrasted CT of the abdomen and pelvis on 11/17/2021 where a 6.7 x 5.4 cm mass in the left retroperitoneum of the para-aortic location at the level of the left kidney was identified.  There is a concern for pathological lymphadenopathy with central necrosis or soft tissue malignancy.  Radiology does not feel that contrasted MRI will be as beneficial as a contrasted CT scan and we have been asked to comment regarding  management of contrast exposure. ?  ? ? ?Intake/Output Summary (Last 24 hours) at 11/20/2021 1352 ?Last data filed at 11/20/2021 0550 ?Gross per 24 hour  ?Intake 337 ml  ?Output 1075 ml  ?Net -738 ml  ? ? ?Vitals:  ?Vitals:  ? 11/20/21 1015 11/20/21 1030 11/20/21 1045 11/20/21 1100  ?BP: (!) 142/64 (!) 146/70 (!) 132/51 (!) 125/50  ?Pulse:  81    ?Resp:      ?Temp:      ?TempSrc:      ?SpO2:  97%    ?Weight:      ?Height:      ?  ? ?Physical Exam:  ?General adult male in bed in no acute distress ?HEENT normocephalic atraumatic extraocular movements intact sclera anicteric ?Neck supple trachea midline ?Lungs clear to auscultation bilaterally normal work of breathing at rest  ?Heart S1S2 no rub ?Abdomen soft nontender nondistended ?Extremities no edema  ?Psych normal mood and affect ?Neuro - alert and oriented x 3 provides hx and follows commands ?Access RUE AVF high pitched bruit and thrill ? ?Medications reviewed  ? ?Labs:  ? ?  Latest Ref Rng & Units 11/20/2021  ?  3:52 AM 11/19/2021  ?  3:58 AM 11/18/2021  ?  4:09 AM  ?BMP  ?Glucose 70 - 99 mg/dL 131   158   156    ?BUN 8 - 23 mg/dL 88   100   98    ?Creatinine 0.61 - 1.24 mg/dL 3.83   3.88  3.59    ?Sodium 135 - 145 mmol/L 135   134   132    ?Potassium 3.5 - 5.1 mmol/L 3.7   4.1   4.2    ?Chloride 98 - 111 mmol/L 97   98   97    ?CO2 22 - 32 mmol/L '25   21   22    '$ ?Calcium 8.9 - 10.3 mg/dL 8.7   9.0   8.8    ? ? ? ?Assessment/Plan:  ? ?60M CKD4 with new left retroperitoneal mass requiring further characterization with iodinated contrast contrast prior to biopsy. ?  ?CKD 4 ?He is at baseline Cr, followed by Dr. [name] ?Risk of AKI given contrast administration ?AVF placed 09/04/21; they were concerned about a stenosis and vascular was planning on a fistulogram with likely balloon angioplasty prior to HD ?Giving NS at 75/hr x 12 hours and holding diuretics as below ?New left retroperitoneal mass needing further evaluation - appreciate IR ?Admission with acute on chronic  HFpEF ?resolved and euvolemic, back on oral diuretics ?Holding diuretics for today in light of contrast - reassess resuming tomorrow  ?CAP on antibiotics per primary team  ?Anemia of CKD  ?on ESA therapy as an outpatient ?Hypertension on diltiazem ?Metabolic acidosis on sodium bicarbonate ?mild hyperphosphatemia, on calcium acetate; update phos ?Paroxysmal atrial fibrillation on amiodarone and diltiazem - per primary team  ? ?Claudia Desanctis, MD ?11/20/2021 2:19 PM ? ? ?

## 2021-11-20 NOTE — Progress Notes (Signed)
Nutrition Follow-up ? ?DOCUMENTATION CODES:  ? ?Severe malnutrition in context of chronic illness, Underweight ? ?INTERVENTION:  ?Provide Ensure Enlive po TID, each supplement provides 350 kcal and 20 grams of protein. ?  ?Encourage adequate PO intake.  ? ?NUTRITION DIAGNOSIS:  ? ?Severe Malnutrition related to chronic illness (CHF) as evidenced by severe fat depletion, severe muscle depletion; ongoing ? ?GOAL:  ? ?Patient will meet greater than or equal to 90% of their needs; progressing ? ?MONITOR:  ? ?PO intake, Supplement acceptance, Labs, Weight trends, Skin, I & O's ? ?REASON FOR ASSESSMENT:  ? ?Consult ?Assessment of nutrition requirement/status ? ?ASSESSMENT:  ? ?77 y.o. male with medical history significant of CKD stage IV, chronic diastolic CHF, IIDM, chronic hypoxic respiratory failure on 2 L 24/7, HTN, PAF on Eliquis, moderate protein calorie malnutrition, presented with increasing shortness of breath Right arm AV fistula. ? ?5/12 - CT biopsy left retroperitoneal mass ? ?Meal completion has been varied from 20-100%. Pt currently has Ensure ordered with varied consumption. RD to continue with current orders to aid in caloric and protein needs.  ? ?Labs and medications reviewed.  ? ?Diet Order:   ?Diet Order   ? ?       ?  Diet Carb Modified Fluid consistency: Thin; Room service appropriate? Yes  Diet effective now       ?  ? ?  ?  ? ?  ? ? ?EDUCATION NEEDS:  ? ?Not appropriate for education at this time ? ?Skin:  Skin Assessment: Skin Integrity Issues: ?Skin Integrity Issues:: Incisions ?Incisions: back ? ?Last BM:  5/9 ? ?Height:  ? ?Ht Readings from Last 1 Encounters:  ?11/13/21 '5\' 6"'$  (1.676 m)  ? ? ?Weight:  ? ?Wt Readings from Last 1 Encounters:  ?11/20/21 49.7 kg  ? ?BMI:  Body mass index is 17.69 kg/m?. ? ?Estimated Nutritional Needs:  ? ?Kcal:  1800-2000 ? ?Protein:  85-100 grams ? ?Fluid:  1.2 L/day ? ?[name], MS, RD, LDN ?RD pager number/after hours weekend pager number on Amion. ? ?

## 2021-11-20 NOTE — Procedures (Signed)
Interventional Radiology Procedure Note ? ?Procedure: CT biopsy LEFT retroperitoneal mass ? ?Complications: None immediate ? ?Estimated Blood Loss: < 25 mL ? ?Recommendations: ?- Bedrest x 4 hrs ?- Path pending ? ? ?Signed, ? ?Criselda Peaches, MD ? ? ?

## 2021-11-20 NOTE — Progress Notes (Signed)
?PROGRESS NOTE ? ? ? Adam Reid  VVO:[id number] DOB: 07-26-1945 DOA: 11/12/2021 ?PCP: [name], MD  ?[phone] with chronic diastolic CHF, chronic hypoxic respiratory failure on home O2, CKD 4 was admitted with cough, worsening dyspnea, in the ED chest x-ray noted bilateral pleural effusions,?  Loculation on the right, seen by cardiology in consultation, started on diuretics ?-Treated with IV ceftriaxone, azithromycin for community-acquired pneumonia as well ?-CT chest noted right-sided pneumonia, multifocal infection, moderate right pleural effusion with possible areas of loculation and a soft tissue mass on the left  ?-CT abdomen pelvis without IV contrast noted 6.7 x 5.4 cm mass lesion in the retroperitoneum in the left para-aortic location at the level of the left kidney.  ?-IR and nephrology consulting, plan for CT-guided biopsy ? ?Subjective: ?-Feels okay, denies any complaints, denies dyspnea this morning, going for biopsy today ? ?Assessment and Plan: ? ?Acute on chronic diastolic (congestive) heart failure ([place]) ?Moderate MR ?-Echo 1/23 noted EF of 55%, preserved RV function ?-Diuresed with IV Lasix, he is 4.5 L negative, weight down 11 lbs ?-Cardiology was following, transitioned to oral torsemide 40 Mg twice daily and recommended follow-up, hold torsemide for biopsy, contrast ?-GDMT limited by CKD 4 ?-Kidney function is stable ? ?CKD 4 ?-Followed by Dr. [name], [facility], has a right arm AV fistula ?-Creatinine in the 3.5-3.8 range, baseline around 3.5-4,  ?-Diuretics as noted above, due for contrast with biopsy this morning ?-Monitor urine output and kidney function closely ? ?CAP (community acquired pneumonia) ?Pleural effusion  ?CT chest with right lower lobe infiltrate with possible areas of loculation  ?-Afebrile nontoxic, blood cultures are negative  ?-Treated with IV ceftriaxone, azithromycin, Day 5 change to p.o. ABX today, repeat chest x-ray with persistent infiltrates,  pleural effusion ? ?Chronic respiratory failure ?-On 2 L home O2 at baseline, stable ? ?Left retroperitoneal mass ?-CT and ultrasound noted left retroperitoneal mass, 6.7X 5.4 cm with central necrosis ?-Unable to discern without IV contrast ?-Discussed with radiology yesterday and today, MRI without contrast would be of limited value ?-IR consulted for biopsy, apixaban on hold ?-Discussed with IR, Dr. Laurence Ferrari recommends nephrology consult, he anticipates need for contrast on account of location of the mass near vasculature and ureter ?-Restart anticoagulation in 48 hours if stable ? ?Paroxysmal atrial fibrillation (HCC) ?On amiodarone and diltiazem. ?-Apixaban on hold for biopsy ? ?Thyroid disease ?Elevated TSH ?Normal free T4, plan to follow up thyroid function as outpatient.  ? ?HTN (hypertension) ?Blood pressure has been stable, continue diuresis with torsemide.  ? ?Diabetes mellitus without complication ([place]) ?-CBG low yesterday, decrease sliding scale ?Continue with linagliptin.  ? ?Protein-calorie malnutrition, severe ?Continue with nutritional supplements.  ? ?DVT prophylaxis: SCDs ?Code Status: Full code ?Family Communication: Discussed with patient in detail using interpreter ?Disposition Plan: Home likely 2 to 3 days ? ?Consultants:  ?Cardiology, IR, Renal ? ?Procedures:  ? ?Antimicrobials:  ? ? ?Objective: ?Vitals:  ? 11/20/21 1015 11/20/21 1030 11/20/21 1045 11/20/21 1100  ?BP: (!) 142/64 (!) 146/70 (!) 132/51 (!) 125/50  ?Pulse:  81    ?Resp:      ?Temp:      ?TempSrc:      ?SpO2:  97%    ?Weight:      ?Height:      ? ? ?Intake/Output Summary (Last 24 hours) at 11/20/2021 1112 ?Last data filed at 11/20/2021 0550 ?Gross per 24 hour  ?Intake 337 ml  ?Output 1225 ml  ?Net -888 ml  ? ?Filed  Weights  ? 11/18/21 0300 11/19/21 0407 11/20/21 0506  ?Weight: 50.7 kg 50.2 kg 49.7 kg  ? ? ?Examination: ? ?General exam: Pleasant thinly built male sitting up pleasant thinly built male sitting up in bed, AAOx3, no  distress ?HEENT: No JVD ?CVS: S1-S2, regular rhythm ?Lungs: Clear bilaterally ?Abdomen: Soft, nontender, bowel sounds present ?Extremities: No edema  ?Skin: No rashes ?Psychiatry:  Mood & affect appropriate.  ? ? ? ?Data Reviewed:  ? ?CBC: ?Recent Labs  ?Lab 11/16/21 ?2350 11/18/21 ?0409 11/19/21 ?0174 11/20/21 ?0352  ?WBC 10.4 10.2 9.8 8.3  ?HGB 9.3* 9.2* 9.3* 9.0*  ?HCT 30.2* 28.8* 28.7* 28.6*  ?MCV 86.3 85.5 84.7 85.4  ?PLT 345 324 341 345  ? ?Basic Metabolic Panel: ?Recent Labs  ?Lab 11/16/21 ?0305 11/16/21 ?2350 11/17/21 ?9449 11/18/21 ?0409 11/19/21 ?6759 11/20/21 ?1638  ?NA 135 135 135 132* 134* 135  ?K 3.9 4.2 3.9 4.2 4.1 3.7  ?CL 101 98 96* 97* 98 97*  ?CO2 16* 21* 24 22 21* 25  ?GLUCOSE 126* 171* 256* 156* 158* 131*  ?BUN 92* 103* 100* 98* 100* 88*  ?CREATININE 3.42* 3.50* 3.79* 3.59* 3.88* 3.83*  ?CALCIUM 9.0 8.9 9.0 8.8* 9.0 8.7*  ?PHOS 5.3*  --   --   --   --   --   ? ?GFR: ?Estimated Creatinine Clearance: 11.9 mL/min (A) (by C-G formula based on SCr of 3.83 mg/dL (H)). ?Liver Function Tests: ?Recent Labs  ?Lab 11/16/21 ?0305  ?ALBUMIN 2.9*  ? ?No results for input(s): LIPASE, AMYLASE in the last 168 hours. ?No results for input(s): AMMONIA in the last 168 hours. ?Coagulation Profile: ?Recent Labs  ?Lab 11/20/21 ?4665  ?INR 1.1  ? ?Cardiac Enzymes: ?No results for input(s): CKTOTAL, CKMB, CKMBINDEX, TROPONINI in the last 168 hours. ?BNP (last 3 results) ?No results for input(s): PROBNP in the last 8760 hours. ?HbA1C: ?No results for input(s): HGBA1C in the last 72 hours. ?CBG: ?Recent Labs  ?Lab 11/19/21 ?0604 11/19/21 ?1113 11/19/21 ?1624 11/19/21 ?2253 11/20/21 ?9935  ?GLUCAP 147* 175* 143* 194* 170*  ? ?Lipid Profile: ?No results for input(s): CHOL, HDL, LDLCALC, TRIG, CHOLHDL, LDLDIRECT in the last 72 hours. ?Thyroid Function Tests: ?No results for input(s): TSH, T4TOTAL, FREET4, T3FREE, THYROIDAB in the last 72 hours. ? ?Anemia Panel: ?No results for input(s): VITAMINB12, FOLATE, FERRITIN, TIBC,  IRON, RETICCTPCT in the last 72 hours. ?Urine analysis: ?   ?Component Value Date/Time  ? [place] YELLOW 11/12/2021 1946  ? APPEARANCEUR CLEAR 11/12/2021 1946  ? LABSPEC 1.015 11/12/2021 1946  ? PHURINE 5.0 11/12/2021 1946  ? GLUCOSEU 100 (A) 11/12/2021 1946  ? HGBUR SMALL (A) 11/12/2021 1946  ? [place] NEGATIVE 11/12/2021 1946  ? BILIRUBINUR negative 03/02/2021 1458  ? BILIRUBINUR neg 03/23/2015 1404  ? Benjamin Stain NEGATIVE 11/12/2021 1946  ? PROTEINUR 100 (A) 11/12/2021 1946  ? UROBILINOGEN 0.2 03/02/2021 1458  ? NITRITE NEGATIVE 11/12/2021 1946  ? LEUKOCYTESUR NEGATIVE 11/12/2021 1946  ? ?Sepsis Labs: ?'@LABRCNTIP'$ (procalcitonin:4,lacticidven:4) ? ?) ?Recent Results (from the past 240 hour(s))  ?Culture, blood (Routine X 2) w Reflex to ID Panel     Status: None (Preliminary result)  ? Collection Time: 11/16/21 11:49 PM  ? Specimen: BLOOD LEFT HAND  ?Result Value Ref Range Status  ? Specimen Description BLOOD LEFT HAND  Final  ? Special Requests   Final  ?  BOTTLES DRAWN AEROBIC AND ANAEROBIC Blood Culture results may not be optimal due to an inadequate volume of blood received in culture bottles  ?  Culture   Final  ?  NO GROWTH 3 DAYS ?Performed at [facility], [place] [address], [place], [place] [address] ?  ? Report Status PENDING  Incomplete  ?Culture, blood (Routine X 2) w Reflex to ID Panel     Status: None (Preliminary result)  ? Collection Time: 11/16/21 11:50 PM  ? Specimen: BLOOD LEFT HAND  ?Result Value Ref Range Status  ? Specimen Description BLOOD LEFT HAND  Final  ? Special Requests   Final  ?  BOTTLES DRAWN AEROBIC AND ANAEROBIC Blood Culture results may not be optimal due to an inadequate volume of blood received in culture bottles  ? Culture   Final  ?  NO GROWTH 3 DAYS ?Performed at [facility], [place] [address] [place], [place] [address] ?  ? Report Status PENDING  Incomplete  ?  ? ?Radiology Studies: ?DG Chest 2 View ? ?Result Date: 11/19/2021 ?CLINICAL DATA:  Pleural effusions  EXAM: CHEST - 2 VIEW COMPARISON:  Previous studies including the chest radiograph done on 11/12/2021 and CT chest done on 11/15/2021 FINDINGS: Transverse diameter of heart is increased. Small to moderate bilate

## 2021-11-20 NOTE — Sedation Documentation (Signed)
Prior to procedure start, pre-procedure checklist completed and questions asked with interpreter [place] [phone].  ?

## 2021-11-21 DIAGNOSIS — I5033 Acute on chronic diastolic (congestive) heart failure: Secondary | ICD-10-CM | POA: Diagnosis not present

## 2021-11-21 LAB — BASIC METABOLIC PANEL
Anion gap: 9 (ref 5–15)
BUN: 84 mg/dL — ABNORMAL HIGH (ref 8–23)
CO2: 25 mmol/L (ref 22–32)
Calcium: 8.5 mg/dL — ABNORMAL LOW (ref 8.9–10.3)
Chloride: 99 mmol/L (ref 98–111)
Creatinine, Ser: 3.7 mg/dL — ABNORMAL HIGH (ref 0.61–1.24)
GFR, Estimated: 16 mL/min — ABNORMAL LOW (ref >60)
Glucose, Bld: 175 mg/dL — ABNORMAL HIGH (ref 70–99)
Potassium: 3.9 mmol/L (ref 3.5–5.1)
Sodium: 133 mmol/L — ABNORMAL LOW (ref 135–145)

## 2021-11-21 LAB — GLUCOSE, CAPILLARY
Glucose-Capillary: 199 mg/dL — ABNORMAL HIGH (ref 70–99)
Glucose-Capillary: 231 mg/dL — ABNORMAL HIGH (ref 70–99)
Glucose-Capillary: 87 mg/dL (ref 70–99)
Glucose-Capillary: 278 mg/dL — ABNORMAL HIGH (ref 70–99)

## 2021-11-21 LAB — CBC
HCT: 27.8 % — ABNORMAL LOW (ref 39.0–52.0)
Hemoglobin: 8.5 g/dL — ABNORMAL LOW (ref 13.0–17.0)
MCH: 26.6 pg (ref 26.0–34.0)
MCHC: 30.6 g/dL (ref 30.0–36.0)
MCV: 87.1 fL (ref 80.0–100.0)
Platelets: 340 10*3/uL (ref 150–400)
RBC: 3.19 MIL/uL — ABNORMAL LOW (ref 4.22–5.81)
RDW: 18.4 % — ABNORMAL HIGH (ref 11.5–15.5)
WBC: 9.8 10*3/uL (ref 4.0–10.5)
nRBC: 0 % (ref 0.0–0.2)

## 2021-11-21 LAB — PHOSPHORUS: Phosphorus: 4.6 mg/dL (ref 2.5–4.6)

## 2021-11-21 MED ORDER — TORSEMIDE 20 MG PO TABS
40.0000 mg | ORAL_TABLET | Freq: Two times a day (BID) | ORAL | Status: DC
  Administered 2021-11-21 – 2021-11-22 (×2): 40 mg via ORAL
  Filled 2021-11-21 (×2): qty 2

## 2021-11-21 NOTE — Progress Notes (Signed)
?PROGRESS NOTE ? ? ? Adam Reid  SNK:[id number] DOB: Apr 20, 1946 DOA: 11/12/2021 ?PCP: [name], MD  ?[phone] with chronic diastolic CHF, chronic hypoxic respiratory failure on home O2, CKD 4 was admitted with cough, worsening dyspnea, in the ED chest x-ray noted bilateral pleural effusions,?  Loculation on the right, seen by cardiology in consultation, started on diuretics ?-Treated with IV ceftriaxone, azithromycin for community-acquired pneumonia as well ?-CT chest noted right-sided pneumonia, multifocal infection, moderate right pleural effusion with possible areas of loculation and a soft tissue mass on the left  ?-CT abdomen pelvis without IV contrast noted 6.7 x 5.4 cm mass lesion in the retroperitoneum in the left para-aortic location at the level of the left kidney.  ?-IR and nephrology consulting,  ?-Underwent CT-guided biopsy, with contrast 5/12 ? ?Subjective: ?-Feels okay, denies any complaints this morning, labs pending ? ?Assessment and Plan: ? ?Acute on chronic diastolic (congestive) heart failure ([place]) ?Moderate MR ?-Echo 1/23 noted EF of 55%, preserved RV function ?-Diuresed with IV Lasix, he is 4.5 L negative, weight down 11 lbs ?-Cardiology was following, transitioned to oral torsemide 40 Mg twice daily and recommended follow-up, held torsemide for biopsy with contrast yesterday, given gentle IV fluids ?-GDMT limited by CKD 4 ?-Patient nephrology input, a.m. labs pending ? ?CKD 4 ?-Followed by Dr. [name], [facility], has a right arm AV fistula ?-Creatinine in the 3.5-3.8 range, baseline around 3.5-4,  ?-Diuretics as noted above, s/p contrast with biopsy 5/20 ?-Monitor urine output and kidney function closely ? ?CAP (community acquired pneumonia) ?Pleural effusion  ?CT chest with right lower lobe infiltrate with possible areas of loculation  ?-Afebrile nontoxic, blood cultures are negative  ?-Treated with IV ceftriaxone, azithromycin, then changed to p.o. cefdinir, repeat  chest x-ray with persistent infiltrates, pleural effusion, continue ABX for 2 more days ? ?Chronic respiratory failure ?-On 2 L home O2 at baseline, stable ? ?Left retroperitoneal mass ?-CT and ultrasound noted left retroperitoneal mass, 6.7X 5.4 cm with central necrosis ?-Discussed with radiology, MRI without contrast would be of limited value ?-IR consulted for biopsy, apixaban held ?-Discussed with IR, Dr. Laurence Ferrari, nephrology following now, status post biopsy yesterday ?-Restart Eliquis tomorrow  ? ?Paroxysmal atrial fibrillation (HCC) ?On amiodarone and diltiazem. ?-Apixaban held, restart tomorrow ? ?Thyroid disease ?Elevated TSH ?Normal free T4, plan to follow up thyroid function as outpatient.  ? ?HTN (hypertension) ?Blood pressure has been stable, continue diuresis with torsemide.  ? ?Diabetes mellitus without complication ([place]) ?-CBGs up and down, now stable, continue meal coverage ?Continue with linagliptin.  ? ?Protein-calorie malnutrition, severe ?Continue with nutritional supplements.  ? ?DVT prophylaxis: SCDs ?Code Status: Full code ?Family Communication: Discussed with patient i and wife ?Disposition Plan: Home likely 1 to 2 days ? ?Consultants:  ?Cardiology, IR, Renal ? ?Procedures: CT-guided biopsy-Dr. Laurence Ferrari 5/12 ? ?Antimicrobials:  ? ? ?Objective: ?Vitals:  ? 11/20/21 1600 11/20/21 1736 11/20/21 2000 11/21/21 0400  ?BP: (!) 113/41 (!) 118/56 126/64 (!) 173/80  ?Pulse: 78 81    ?Resp:  15  19  ?Temp:  98.3 ?F (36.8 ?C) 98.3 ?F (36.8 ?C) 97.7 ?F (36.5 ?C)  ?TempSrc:  Oral Oral Oral  ?SpO2: 92% 98%  92%  ?Weight:    51.6 kg  ?Height:      ? ? ?Intake/Output Summary (Last 24 hours) at 11/21/2021 1036 ?Last data filed at 11/21/2021 9024 ?Gross per 24 hour  ?Intake 1186 ml  ?Output 768 ml  ?Net 418 ml  ? ?Filed Weights  ?  11/19/21 0407 11/20/21 0506 11/21/21 0400  ?Weight: 50.2 kg 49.7 kg 51.6 kg  ? ? ?Examination: ? ?General exam: Pleasant thinly built male sitting up in bed, AAOx3, no  distress ?CVS: S1-S2, regular rhythm ?Lungs: Decreased breath sounds to bases ?Abdomen: Soft, nontender, bowel sounds present ?Extremities: No edema ?Skin: No rashes ?Psychiatry:  Mood & affect appropriate.  ? ? ? ?Data Reviewed:  ? ?CBC: ?Recent Labs  ?Lab 11/16/21 ?2350 11/18/21 ?0409 11/19/21 ?2992 11/20/21 ?0352  ?WBC 10.4 10.2 9.8 8.3  ?HGB 9.3* 9.2* 9.3* 9.0*  ?HCT 30.2* 28.8* 28.7* 28.6*  ?MCV 86.3 85.5 84.7 85.4  ?PLT 345 324 341 345  ? ?Basic Metabolic Panel: ?Recent Labs  ?Lab 11/16/21 ?0305 11/16/21 ?2350 11/17/21 ?4268 11/18/21 ?0409 11/19/21 ?3419 11/20/21 ?6222  ?NA 135 135 135 132* 134* 135  ?K 3.9 4.2 3.9 4.2 4.1 3.7  ?CL 101 98 96* 97* 98 97*  ?CO2 16* 21* 24 22 21* 25  ?GLUCOSE 126* 171* 256* 156* 158* 131*  ?BUN 92* 103* 100* 98* 100* 88*  ?CREATININE 3.42* 3.50* 3.79* 3.59* 3.88* 3.83*  ?CALCIUM 9.0 8.9 9.0 8.8* 9.0 8.7*  ?PHOS 5.3*  --   --   --   --   --   ? ?GFR: ?Estimated Creatinine Clearance: 12.3 mL/min (A) (by C-G formula based on SCr of 3.83 mg/dL (H)). ?Liver Function Tests: ?Recent Labs  ?Lab 11/16/21 ?0305  ?ALBUMIN 2.9*  ? ?No results for input(s): LIPASE, AMYLASE in the last 168 hours. ?No results for input(s): AMMONIA in the last 168 hours. ?Coagulation Profile: ?Recent Labs  ?Lab 11/20/21 ?9798  ?INR 1.1  ? ?Cardiac Enzymes: ?No results for input(s): CKTOTAL, CKMB, CKMBINDEX, TROPONINI in the last 168 hours. ?BNP (last 3 results) ?No results for input(s): PROBNP in the last 8760 hours. ?HbA1C: ?No results for input(s): HGBA1C in the last 72 hours. ?CBG: ?Recent Labs  ?Lab 11/20/21 ?1314 11/20/21 ?1316 11/20/21 ?1555 11/20/21 ?2111 11/21/21 ?0612  ?GLUCAP 406* 398* 314* 78 199*  ? ?Lipid Profile: ?No results for input(s): CHOL, HDL, LDLCALC, TRIG, CHOLHDL, LDLDIRECT in the last 72 hours. ?Thyroid Function Tests: ?No results for input(s): TSH, T4TOTAL, FREET4, T3FREE, THYROIDAB in the last 72 hours. ? ?Anemia Panel: ?No results for input(s): VITAMINB12, FOLATE, FERRITIN, TIBC, IRON,  RETICCTPCT in the last 72 hours. ?Urine analysis: ?   ?Component Value Date/Time  ? [place] YELLOW 11/12/2021 1946  ? APPEARANCEUR CLEAR 11/12/2021 1946  ? LABSPEC 1.015 11/12/2021 1946  ? PHURINE 5.0 11/12/2021 1946  ? GLUCOSEU 100 (A) 11/12/2021 1946  ? HGBUR SMALL (A) 11/12/2021 1946  ? [place] NEGATIVE 11/12/2021 1946  ? BILIRUBINUR negative 03/02/2021 1458  ? BILIRUBINUR neg 03/23/2015 1404  ? [place] NEGATIVE 11/12/2021 1946  ? PROTEINUR 100 (A) 11/12/2021 1946  ? UROBILINOGEN 0.2 03/02/2021 1458  ? NITRITE NEGATIVE 11/12/2021 1946  ? LEUKOCYTESUR NEGATIVE 11/12/2021 1946  ? ?Sepsis Labs: ?'@LABRCNTIP'$ (procalcitonin:4,lacticidven:4) ? ?) ?Recent Results (from the past 240 hour(s))  ?Culture, blood (Routine X 2) w Reflex to ID Panel     Status: None (Preliminary result)  ? Collection Time: 11/16/21 11:49 PM  ? Specimen: BLOOD LEFT HAND  ?Result Value Ref Range Status  ? Specimen Description BLOOD LEFT HAND  Final  ? Special Requests   Final  ?  BOTTLES DRAWN AEROBIC AND ANAEROBIC Blood Culture results may not be optimal due to an inadequate volume of blood received in culture bottles  ? Culture   Final  ?  NO GROWTH 4  DAYS ?Performed at [facility], [place] [address] [place], [place] [address] ?  ? Report Status PENDING  Incomplete  ?Culture, blood (Routine X 2) w Reflex to ID Panel     Status: None (Preliminary result)  ? Collection Time: 11/16/21 11:50 PM  ? Specimen: BLOOD LEFT HAND  ?Result Value Ref Range Status  ? Specimen Description BLOOD LEFT HAND  Final  ? Special Requests   Final  ?  BOTTLES DRAWN AEROBIC AND ANAEROBIC Blood Culture results may not be optimal due to an inadequate volume of blood received in culture bottles  ? Culture   Final  ?  NO GROWTH 4 DAYS ?Performed at [facility], [place] [address] [place],  [address] ?  ? Report Status PENDING  Incomplete  ?  ? ?Radiology Studies: ?CT ABDOMINAL MASS BIOPSY ? ?Result Date: 11/20/2021 ?INDICATION: 76 year old male with  necrotic left retroperitoneal mass/adenopathy of uncertain etiology. He presents for CT-guided biopsy. Of note, there are multiple potential vascular structures surrounding mass. Therefore, CT with intravenous cont

## 2021-11-21 NOTE — Progress Notes (Signed)
[facility] ?Progress Note ? ?Name: Adam Reid ?MRN: [id number] ?DOB: 1946-07-07 ? ?Chief Complaint:  ?Shortness of breath ? ?Subjective:  ? patient had 760 mL UOP over 5/12.  He speaks [place] and we used an audio interpreter as this was automatically triggered when there was a delay in getting a video interpreter.  his am labs were cancelled earlier and just drawn.  He uses oxygen at home sometimes. Not aware of a heart murmur (however present in charting). Feels ok.  ? ?Review of systems:  ?Denies n/v ?Denies shortness of breath or chest pain ? ?------------- ?Background on consult:  ?49M with advanced CKD with need for IV contrast after recent finding of a left retroperitoneal mass.  He was originally admitted 5/4 after presenting with cough and dyspnea where he has been treated for an acute on chronic HFpEF exacerbation with possible community-acquired pneumonia.  He has diuresed with IV Lasix and transition to oral torsemide after significant urine output and decrease in weight by 11 pounds.  He is now on room air and appears to be euvolemic.  Other PMH includes CKD4 followed by Dr. [name] at [facility], with a right brachiocephalic AV fistula placed earlier this year, anemia of CKD on ESA therapy, hypertension, DM 2, hyperlipidemia, tobacco use.  Baseline creatinine appears to be around 3.4-3.9, and during his time in the hospital he has been within this range.   ?  ?In the process of his evaluation he had a noncontrasted CT of the abdomen and pelvis on 11/17/2021 where a 6.7 x 5.4 cm mass in the left retroperitoneum of the para-aortic location at the level of the left kidney was identified.  There is a concern for pathological lymphadenopathy with central necrosis or soft tissue malignancy.  Radiology does not feel that contrasted MRI will be as beneficial as a contrasted CT scan and we have been asked to comment regarding management of contrast exposure. ?  ? ? ?Intake/Output Summary  (Last 24 hours) at 11/21/2021 1126 ?Last data filed at 11/21/2021 1111 ?Gross per 24 hour  ?Intake 1186 ml  ?Output 808 ml  ?Net 378 ml  ? ? ?Vitals:  ?Vitals:  ? 11/20/21 1736 11/20/21 2000 11/21/21 0400 11/21/21 1108  ?BP: (!) 118/56 126/64 (!) 173/80 (!) 141/71  ?Pulse: 81   80  ?Resp: '15  19 18  '$ ?Temp: 98.3 ?F (36.8 ?C) 98.3 ?F (36.8 ?C) 97.7 ?F (36.5 ?C) 98.3 ?F (36.8 ?C)  ?TempSrc: Oral Oral Oral Oral  ?SpO2: 98%  92% 93%  ?Weight:   51.6 kg   ?Height:      ?  ? ?Physical Exam:   ?General adult male in bed in no acute distress ?HEENT normocephalic atraumatic extraocular movements intact sclera anicteric ?Neck supple trachea midline ?Lungs clear to auscultation bilaterally normal work of breathing at rest  ?Heart S1S2 murmur at the apex appreciated  ?Abdomen soft nontender nondistended ?Extremities no edema  ?Psych normal mood and affect ?Neuro - alert and oriented x 3 provides hx and follows commands ?Access RUE AVF high pitched bruit and thrill ? ?Medications reviewed  ? ?Labs:  ? ?  Latest Ref Rng & Units 11/20/2021  ?  3:52 AM 11/19/2021  ?  3:58 AM 11/18/2021  ?  4:09 AM  ?BMP  ?Glucose 70 - 99 mg/dL 131   158   156    ?BUN 8 - 23 mg/dL 88   100   98    ?Creatinine 0.61 - 1.24 mg/dL 3.83  3.88   3.59    ?Sodium 135 - 145 mmol/L 135   134   132    ?Potassium 3.5 - 5.1 mmol/L 3.7   4.1   4.2    ?Chloride 98 - 111 mmol/L 97   98   97    ?CO2 22 - 32 mmol/L '25   21   22    '$ ?Calcium 8.9 - 10.3 mg/dL 8.7   9.0   8.8    ? ? ? ?Assessment/Plan:  ? ?23M CKD4 with new left retroperitoneal mass requiring further characterization with iodinated contrast contrast prior to biopsy. ?  ?CKD 4 ?Has previously been at/near baseline this admission, followed by Dr. [name].  Note AVF placed 09/04/21; they were concerned about a stenosis and vascular was planning on a fistulogram with likely balloon angioplasty prior to HD if and when this is needed ?Currently at risk of AKI given contrast administration ?Labs from today are stable   ?Resume torsemide at 40 mg BID this afternoon   ?New left retroperitoneal mass needing further evaluation - appreciate IR ?Admission with acute on chronic HFpEF ?resolved and euvolemic. Diuretics paused for contrast ?CAP on antibiotics per primary team  ?Anemia of CKD  ?on ESA therapy as an outpatient ?Hypertension on diltiazem ?Metabolic acidosis on sodium bicarbonate ?mild hyperphosphatemia, on calcium acetate; improved ?Paroxysmal atrial fibrillation on amiodarone and diltiazem - per primary team  ? ?Disposition - would continue monitoring post-contrast ? ?Claudia Desanctis, MD ?11/21/2021 11:49 AM ? ? ? ?

## 2021-11-22 DIAGNOSIS — I5033 Acute on chronic diastolic (congestive) heart failure: Secondary | ICD-10-CM | POA: Diagnosis not present

## 2021-11-22 LAB — BASIC METABOLIC PANEL
Anion gap: 12 (ref 5–15)
BUN: 81 mg/dL — ABNORMAL HIGH (ref 8–23)
CO2: 24 mmol/L (ref 22–32)
Calcium: 8.6 mg/dL — ABNORMAL LOW (ref 8.9–10.3)
Chloride: 97 mmol/L — ABNORMAL LOW (ref 98–111)
Creatinine, Ser: 3.93 mg/dL — ABNORMAL HIGH (ref 0.61–1.24)
GFR, Estimated: 15 mL/min — ABNORMAL LOW (ref >60)
Glucose, Bld: 190 mg/dL — ABNORMAL HIGH (ref 70–99)
Potassium: 4.4 mmol/L (ref 3.5–5.1)
Sodium: 133 mmol/L — ABNORMAL LOW (ref 135–145)

## 2021-11-22 LAB — CULTURE, BLOOD (ROUTINE X 2)
Culture: NO GROWTH
Culture: NO GROWTH

## 2021-11-22 LAB — CBC
HCT: 27.5 % — ABNORMAL LOW (ref 39.0–52.0)
Hemoglobin: 8.4 g/dL — ABNORMAL LOW (ref 13.0–17.0)
MCH: 26.7 pg (ref 26.0–34.0)
MCHC: 30.5 g/dL (ref 30.0–36.0)
MCV: 87.3 fL (ref 80.0–100.0)
Platelets: 321 10*3/uL (ref 150–400)
RBC: 3.15 MIL/uL — ABNORMAL LOW (ref 4.22–5.81)
RDW: 18.5 % — ABNORMAL HIGH (ref 11.5–15.5)
WBC: 8.9 10*3/uL (ref 4.0–10.5)
nRBC: 0 % (ref 0.0–0.2)

## 2021-11-22 LAB — GLUCOSE, CAPILLARY
Glucose-Capillary: 174 mg/dL — ABNORMAL HIGH (ref 70–99)
Glucose-Capillary: 126 mg/dL — ABNORMAL HIGH (ref 70–99)

## 2021-11-22 MED ORDER — CEFDINIR 300 MG PO CAPS: 300.0000 mg | ORAL_CAPSULE | Freq: Every day | ORAL | 0 refills | Status: AC

## 2021-11-22 MED ORDER — GLIPIZIDE ER 5 MG PO TB24: 5.0000 mg | ORAL_TABLET | Freq: Every day | ORAL | Status: DC

## 2021-11-22 NOTE — Progress Notes (Signed)
RN went over discharge instructions with the patient and patient's wife at the bedside. Patient's wife made aware of medication changed and follow up appointments. Patient's wife translated to patient the follow up appointments. RN removed PIV and patient tolerated well. Site is clean dry and intact. Tele removed and CCMD notified. Patient is getting dressed. Transport is at the bedside. ?

## 2021-11-22 NOTE — Discharge Summary (Signed)
Physician Discharge Summary  ?Adam Reid WER:[id number] DOB: Jun 08, 1946 DOA: 11/12/2021 ? ?PCP: [name], MD ? ?Admit date: 11/12/2021 ?Discharge date: 11/22/2021 ? ?Time spent: 45 minutes ? ?Recommendations for Outpatient Follow-up:  ?PCP  Dr.Greene in 1 week, please follow-up retroperitoneal mass biopsy results, anticipate need for oncology follow-up ?Nephrology Dr. [name] in 1 to 2 weeks, titrate diuretics ? ? ?Discharge Diagnoses:  ?Principal Problem: ?  Acute on chronic diastolic (congestive) heart failure (HCC) ?  CKD 4 -advanced ?  CAP (community acquired pneumonia) ?  Chronic kidney disease ?  Paroxysmal atrial fibrillation (HCC) ?  HTN (hypertension) ?  Thyroid disease ?  Diabetes mellitus without complication ([place]) ?  Protein-calorie malnutrition, severe ?Retroperitoneal mass ? ? ?Discharge Condition: stable ? ?Diet recommendation: diabetic, low sodium ? ?Filed Weights  ? 11/20/21 0506 11/21/21 0400 11/22/21 0311  ?Weight: 49.7 kg 51.6 kg 51.7 kg  ? ? ?History of present illness:  ?75/M with chronic diastolic CHF, chronic hypoxic respiratory failure on home O2, CKD 4 was admitted with cough, worsening dyspnea, in the ED chest x-ray noted bilateral pleural effusions,?  Loculation on the right, seen by cardiology in consultation, started on diuretics ? ?Hospital Course:  ? ?Acute on chronic diastolic (congestive) heart failure ([place]) ?Moderate MR ?-Echo 1/23 noted EF of 55%, preserved RV function ?-Diuresed with IV Lasix, he is 4.5 L negative, weight down 11 lbs ?-Cardiology was following, transitioned to oral torsemide 40 Mg twice daily and recommended follow-up, held torsemide for biopsy with contrast 5/12, given gentle IV fluids, seen by nephrology,  ?-Now stable, torsemide resumed 40 Mg twice daily ?-GDMT limited by CKD 4 ?-Follow-up in heart failure clinic and nephrology Dr. [name] ?  ?CKD 4 ?-Followed by Dr. [name], [facility], has a right arm AV fistula ?-Creatinine in the 3.5-3.9  range this admission, baseline around 3.5-4,  ?-Diuretics as noted above, s/p contrast with biopsy 5/20 ?-Creatinine relatively stable, clinically appears euvolemic, torsemide resumed ?-Follow-up with Dr. [name] in 1 to 2 weeks ?  ?CAP (community acquired pneumonia) ?Pleural effusion  ?CT chest with right lower lobe infiltrate with possible areas of loculation  ?-Afebrile nontoxic, blood cultures are negative  ?-Treated with IV ceftriaxone, azithromycin, then changed to p.o. cefdinir, repeat chest x-ray with persistent infiltrates, pleural effusion, continue ABX for 2 more days ?  ?Chronic respiratory failure ?-On 2 L home O2 at baseline, stable ?  ?Left retroperitoneal mass ?-CT and ultrasound noted left retroperitoneal mass, 6.7X 5.4 cm with central necrosis ?-Discussed with radiology, MRI without contrast would be of limited value ?-IR consulted for biopsy, apixaban held for 48 hours prior to biopsy and 24h after ?-Discussed with IR, Dr. Laurence Ferrari, followed by nephrology as well, status post biopsy 5/12 ?-Restarted Eliquis ?-Please follow-up biopsy results in 1 week, anticipate need for oncology follow-up thereafter ?  ?Paroxysmal atrial fibrillation (HCC) ?On amiodarone and diltiazem. ?-Apixaban held for biopsy, restarted at discharge ?  ?Thyroid disease ?Elevated TSH ?Normal free T4, plan to follow up thyroid function as outpatient.  ?  ?HTN (hypertension) ?Blood pressure has been stable, continue diuresis with torsemide.  ?  ?Diabetes mellitus without complication ([place]) ?-CBGs up and down, now stable, continue meal coverage ?Continue with linagliptin.  Glucotrol dose decreased on account of advanced CKD ?  ?Protein-calorie malnutrition, severe ?Continue with nutritional supplements.  ?  ?Consultants:  ?Cardiology, IR, Renal ?  ?Procedures: CT-guided biopsy-Dr. Laurence Ferrari 5/12 ? ? ?Discharge Exam: ?Vitals:  ? 11/22/21 0315 11/22/21 0806  ?BP: Adam Reid)  144/71 (!) 124/57  ?Pulse: 84 80  ?Resp: 18 16  ?Temp: 97.8 ?F  (36.6 ?C)   ?SpO2: 94% 99%  ? ?General exam: Pleasant thinly built male sitting up in bed, AAOx3, no distress ?CVS: S1-S2, regular rhythm ?Lungs: Decreased breath sounds to bases ?Abdomen: Soft, nontender, bowel sounds present ?Extremities: No edema ?Skin: No rashes ?Psychiatry:  Mood & affect appropriate.  ? ? ?Discharge Instructions ? ? ?Discharge Instructions   ? ? Diet - low sodium heart healthy   Complete by: As directed ?  ? Diet Carb Modified   Complete by: As directed ?  ? Discharge instructions   Complete by: As directed ?  ? FU with PCP in 6-7days, Biopsy results should be back by then  ? Increase activity slowly   Complete by: As directed ?  ? No wound care   Complete by: As directed ?  ? ?  ? ?Allergies as of 11/22/2021   ? ?   Reactions  ? Pork-derived Products Other (See Comments)  ? Patient "just does not eat this"  ? ?  ? ?  ?Medication List  ?  ? ?TAKE these medications   ? ?amiodarone 200 MG tablet ?Commonly known as: PACERONE ?Take 1 tablet (200 mg total) by mouth daily. Follow up appt required for further refills. Please call [phone] to schedule an appt ?What changed: additional instructions ?  ?atorvastatin 80 MG tablet ?Commonly known as: LIPITOR ?TAKE 1 TABLET(80 MG) BY MOUTH DAILY ?What changed:  ?how much to take ?how to take this ?when to take this ?additional instructions ?  ?blood glucose meter kit and supplies ?Use up to two times daily as directed  ICD10 E10.9 E11.9 ?  ?Calphron 667 MG tablet ?Generic drug: calcium acetate ?Take 667 mg by mouth 3 (three) times daily. ?  ?cefdinir 300 MG capsule ?Commonly known as: OMNICEF ?Take 1 capsule (300 mg total) by mouth daily for 3 days. ?Start taking on: Nov 23, 2021 ?  ?citalopram 20 MG tablet ?Commonly known as: CELEXA ?TAKE 1 TABLET(20 MG) BY MOUTH DAILY ?What changed: See the new instructions. ?  ?diltiazem 180 MG 24 hr capsule ?Commonly known as: CARDIZEM CD ?TAKE 1 CAPSULE(180 MG) BY MOUTH DAILY ?What changed: See the new  instructions. ?  ?Eliquis 2.5 MG Tabs tablet ?Generic drug: apixaban ?TAKE 1 TABLET(2.5 MG) BY MOUTH TWICE DAILY ?What changed: See the new instructions. ?  ?glipiZIDE 5 MG 24 hr tablet ?Commonly known as: GLUCOTROL XL ?Take 1 tablet (5 mg total) by mouth daily with breakfast. ?What changed: when to take this ?  ?GlucoCom Lancets Misc ?Use for home glucose monitoring ?  ?OneTouch Verio test strip ?Generic drug: glucose blood ?Test up to 2 times per day.  ?Uncontrolled diabetes with hyperglycemia and stage 4 CKD. ?  ?polyethylene glycol 17 g packet ?Commonly known as: MIRALAX / GLYCOLAX ?Take 17 g by mouth daily as needed for mild constipation or moderate constipation (MIX AND DRINK). ?  ?sitaGLIPtin 25 MG tablet ?Commonly known as: Januvia ?TAKE 1 TABLET(25 MG) BY MOUTH DAILY ?What changed:  ?how much to take ?how to take this ?when to take this ?additional instructions ?  ?sodium bicarbonate 650 MG tablet ?Take 650 mg by mouth 2 (two) times daily. ?  ?tamsulosin 0.4 MG Caps capsule ?Commonly known as: FLOMAX ?Take 0.4 mg by mouth daily. ?  ?torsemide 20 MG tablet ?Commonly known as: DEMADEX ?Take 2 tablets (40 mg total) by mouth 2 (two) times daily. ?  ?  vitamin B-12 500 MCG tablet ?Commonly known as: CYANOCOBALAMIN ?Take 500 mcg by mouth daily. ?  ?VITAMIN D3 PO ?Take 1 tablet by mouth daily. ?  ? ?  ? ?Allergies  ?Allergen Reactions  ? Pork-Derived Products Other (See Comments)  ?  Patient "just does not eat this"  ? ? Follow-up Information   ? ? [facility]. Go on 11/30/2021.   ?Specialty: Cardiology ?Why: 3:30 PM, [facility], parking code 1002 ?Contact information: ?[address] ?[phone] mc ?[place] ?[phone] ? ?  ?  ? ? [name], MD. Schedule an appointment as soon as possible for a visit in 1 week(s).   ?Specialties: Family Medicine, Sports Medicine ?Why: Need to FU Biopsy results ?Contact  information: ?[address] ?[place] [address] ?[phone] ? ? ?  ?  ? ?  ?  ? ?  ? ? ? ?The results of significant diagnostics from this hospitalization (including imaging, microbiology, ancillary and laboratory) are l

## 2021-11-22 NOTE — Progress Notes (Signed)
[facility] ?Progress Note ? ?Name: Terri Malerba ?MRN: [id number] ?DOB: 1945/12/22 ? ?Chief Complaint:  ?Shortness of breath ? ?Subjective:  ? patient had 1.1L UOP over 5/13.  He speaks [place] - we were automatically connected to an audio interpreter as a video interpreter was not available.  He feels ok.  He asks when he can go home.    ? ?Review of systems:   ?Denies n/v ?Denies shortness of breath or chest pain ? ?------------- ?Background on consult:  ?56M with advanced CKD with need for IV contrast after recent finding of a left retroperitoneal mass.  He was originally admitted 5/4 after presenting with cough and dyspnea where he has been treated for an acute on chronic HFpEF exacerbation with possible community-acquired pneumonia.  He has diuresed with IV Lasix and transition to oral torsemide after significant urine output and decrease in weight by 11 pounds.  He is now on room air and appears to be euvolemic.  Other PMH includes CKD4 followed by Dr. [name] at [facility], with a right brachiocephalic AV fistula placed earlier this year, anemia of CKD on ESA therapy, hypertension, DM 2, hyperlipidemia, tobacco use.  Baseline creatinine appears to be around 3.4-3.9, and during his time in the hospital he has been within this range.   ?  ?In the process of his evaluation he had a noncontrasted CT of the abdomen and pelvis on 11/17/2021 where a 6.7 x 5.4 cm mass in the left retroperitoneum of the para-aortic location at the level of the left kidney was identified.  There is a concern for pathological lymphadenopathy with central necrosis or soft tissue malignancy.  Radiology does not feel that contrasted MRI will be as beneficial as a contrasted CT scan and we have been asked to comment regarding management of contrast exposure. ?  ? ? ?Intake/Output Summary (Last 24 hours) at 11/22/2021 1114 ?Last data filed at 11/22/2021 1002 ?Gross per 24 hour  ?Intake 1076 ml  ?Output 954 ml  ?Net 122 ml   ? ? ?Vitals:  ?Vitals:  ? 11/21/21 1920 11/22/21 0311 11/22/21 0315 11/22/21 0806  ?BP: (!) 141/58  (!) 144/71 (!) 124/57  ?Pulse: 82  84 80  ?Resp: '20  18 16  '$ ?Temp: 97.8 ?F (36.6 ?C)  97.8 ?F (36.6 ?C)   ?TempSrc: Oral  Oral   ?SpO2: 94%  94% 99%  ?Weight:  51.7 kg    ?Height:      ?  ? ?Physical Exam:    ?General adult male in bed in no acute distress ?HEENT normocephalic atraumatic extraocular movements intact sclera anicteric ?Neck supple trachea midline ?Lungs clear to auscultation bilaterally normal work of breathing at rest  ?Heart S1S2 murmur at the apex appreciated  ?Abdomen soft nontender nondistended ?Extremities no edema  ?Psych normal mood and affect ?Neuro - alert and oriented x 3 provides hx and follows commands ?Access RUE AVF area with high pitched bruit and thrill ? ?Medications reviewed  ? ?Labs:  ? ?  Latest Ref Rng & Units 11/22/2021  ?  3:51 AM 11/21/2021  ? 11:05 AM 11/20/2021  ?  3:52 AM  ?BMP  ?Glucose 70 - 99 mg/dL 190   175   131    ?BUN 8 - 23 mg/dL 81   84   88    ?Creatinine 0.61 - 1.24 mg/dL 3.93   3.70   3.83    ?Sodium 135 - 145 mmol/L 133   133   135    ?  Potassium 3.5 - 5.1 mmol/L 4.4   3.9   3.7    ?Chloride 98 - 111 mmol/L 97   99   97    ?CO2 22 - 32 mmol/L '24   25   25    '$ ?Calcium 8.9 - 10.3 mg/dL 8.6   8.5   8.7    ? ? ? ?Assessment/Plan:  ? ?75M CKD4 with new left retroperitoneal mass requiring further characterization with iodinated contrast contrast prior to biopsy. ?  ?CKD 4 ?Has previously been at/near baseline this admission, followed by Dr. [name].  Note AVF placed 09/04/21; they were concerned about a stenosis and vascular was planning on a fistulogram with likely balloon angioplasty prior to HD if and when this is needed.  Rec'd contrast with a biopsy this admission on 5/12 ?Labs from today are stable/within his baseline ?Continue torsemide 40 mg BID    ?New left retroperitoneal mass needing further evaluation - appreciate IR ?Admission with acute on chronic  HFpEF ?resolved and euvolemic. Now back on home dose of torsemide  ?CAP on antibiotics per primary team  ?Anemia of CKD  ?on ESA therapy as an outpatient ?Hypertension on diltiazem ?Metabolic acidosis on sodium bicarbonate ?mild hyperphosphatemia, on calcium acetate; improved ?Paroxysmal atrial fibrillation on amiodarone and diltiazem - per primary team  ? ?Disposition - per primary team.  Labs are stable.  [place] for discharge from a strictly nephrology standpoint.  Nephrology will sign off.  I will ensure that he has outpatient follow-up with [facility] in 1-2 weeks.  ? ?Claudia Desanctis, MD ?11/22/2021 11:39 AM ? ? ? ? ?

## 2021-11-23 ENCOUNTER — Ambulatory Visit (INDEPENDENT_AMBULATORY_CARE_PROVIDER_SITE_OTHER): Payer: Medicare Other | Admitting: Family Medicine

## 2021-11-23 VITALS — BP 136/78 | HR 78 | Temp 98.2°F | Resp 16 | Ht 66.0 in | Wt 117.6 lb

## 2021-11-23 DIAGNOSIS — J189 Pneumonia, unspecified organism: Secondary | ICD-10-CM

## 2021-11-23 DIAGNOSIS — R1900 Intra-abdominal and pelvic swelling, mass and lump, unspecified site: Secondary | ICD-10-CM | POA: Diagnosis not present

## 2021-11-23 DIAGNOSIS — J9611 Chronic respiratory failure with hypoxia: Secondary | ICD-10-CM | POA: Diagnosis not present

## 2021-11-23 DIAGNOSIS — I480 Paroxysmal atrial fibrillation: Secondary | ICD-10-CM | POA: Diagnosis not present

## 2021-11-23 DIAGNOSIS — E1122 Type 2 diabetes mellitus with diabetic chronic kidney disease: Secondary | ICD-10-CM

## 2021-11-23 DIAGNOSIS — I5033 Acute on chronic diastolic (congestive) heart failure: Secondary | ICD-10-CM | POA: Diagnosis not present

## 2021-11-23 DIAGNOSIS — R7989 Other specified abnormal findings of blood chemistry: Secondary | ICD-10-CM | POA: Diagnosis not present

## 2021-11-23 DIAGNOSIS — N184 Chronic kidney disease, stage 4 (severe): Secondary | ICD-10-CM

## 2021-11-23 LAB — HEMOGLOBIN A1C: Hgb A1c MFr Bld: 7.4 % — ABNORMAL HIGH (ref 4.6–6.5)

## 2021-11-23 NOTE — Progress Notes (Signed)
? ?Subjective:  ?Patient ID: Adam Reid, male    DOB: October 18, 1945  Age: 76 y.o. MRN: [id number] ? ?CC:  ?Chief Complaint  ?Patient presents with  ? Follow-up  ?  Pt was advised to follow up with PCP, advised to seek referral for endocrinology, was hospitalized for 10 days was advised to take 1 5 mg glipizide and this would be enough in addition to insulin   ? ? ?HPI ?Adam Reid presents for  ?Hospital follow-up. Discharged yesterday.  ? ?Acute on chronic CHF with chronic hypoxic respiratory failure ?Admitted May 4 through May 14.  Initially presented with cough, worsening dyspnea, ED chest x-ray noting bilateral pleural effusions.  Started on diuretics, consulted cardiology.  Diuresed with IV Lasix, 4.5 L negative and weight down 11 pounds.  EF 55% on echo 1/23 with preserved RV function.  He was transitioned to oral torsemide 40 mg twice daily.  Torsemide was held for biopsy with contrast on May 12, given gentle IV fluids, followed by nephrology.  Was resumed on torsemide 40 mg twice daily.  GDMT was limited by CKD 4.  Plan for outpatient follow-up with Dr. [name] with nephrology and heart failure clinic. ?He is still requiring oxygen 2 liters. Min ankle swelling.  ?Taking torsemide BID. Feels better since yesterday  ?Appt with cardiology 5/22.  ? ?Wt Readings from Last 3 Encounters:  ?11/23/21 117 lb 9.6 oz (53.3 kg)  ?11/22/21 113 lb 15.7 oz (51.7 kg)  ?11/06/21 119 lb 12.8 oz (54.3 kg)  ? ?CKD stage IV ?AV fistula in place.  Creatinine 3.5-3.9 during hospital admission with baseline 3.5-4.  Plan for outpatient follow-up with Dr. [name] in next 2 weeks. Creat 3.7 to 3,93 on 5/13- to 5/14. Biopsy below of retroperitoneal mass.  ? ?Community-acquired pneumonia with pleural effusion ?CT chest with right lower lobe infiltrate and possible areas of loculation.  Treated with IV ceftriaxone, azithromycin, transition to p.o. cefdinir with repeat chest x-ray indicated persistent infiltrates, pleural effusion,  continued on antibiotic.  Chronic O2 requirement of 2 L at home.  3 days of cefdinir starting today. Took abx this am, breathing better, no fever  ? ?Left retroperitoneal mass ?Underwent CT and ultrasound during hospitalization indicating left retroperitoneal mass 6.7 x 5.4 cm with central necrosis.  Per discharge paperwork, discussion with radiology indicated the MRI without contrast would be of limited value.  Biopsy performed by interventional radiology.  Plan for outpatient follow-up with biopsy results and further referrals, likely oncology. Biopsy results still pending.  ? ?Paroxysmal atrial fibrillation ?Treated with amiodarone, diltiazem and apixaban was restarted at discharge after temporary hold for biopsy on 5/12. no new bruising/bleeding.  ? ?Elevated TSH with normal free T4 in the hospital, plan for outpatient follow-up. No thyroid meds.  ?Lab Results  ?Component Value Date  ? TSH 4.956 (H) 11/13/2021  ? ?Diabetes with CKD ?Variable glycemic control, treated with meal coverage, insulin inpatient.  Continued januvia $RemoveBeforeDEI'25mg'[id number]$  QD, back on glipizide $RemoveBefo'5mg'odgNwwGtPIF$  QD.  ?Home readings: ?189 this am, took glipizide $RemoveBefore'5mg'ARANyyOZkKntE$  and januvia $RemoveBef'25mg'JXLnIyheYP$  with breakfast.  ?2hr after breakfast - 347.  ?Had few low readings in the hospital.  ?Breakfast is largest meal small lunch and dinner.   ? ?Lab Results  ?Component Value Date  ? HGBA1C 7.9 (H) 08/27/2021  ? ? ? ?History ?Patient Active Problem List  ? Diagnosis Date Noted  ? Abdominal mass 11/17/2021  ? CAP (community acquired pneumonia) 11/16/2021  ? Thyroid disease   ? Diabetes mellitus without complication ([place])   ?  Chronic kidney disease   ? Paroxysmal atrial fibrillation (HCC)   ? Protein-calorie malnutrition, severe 11/13/2021  ? Acute on chronic diastolic (congestive) heart failure ([place]) 11/12/2021  ? Iron deficiency anemia 09/16/2021  ? Acute on chronic diastolic CHF (congestive heart failure) ([place]) 09/14/2021  ? COVID-19 virus infection 09/14/2021  ? Acute kidney injury  superimposed on chronic kidney disease ([place]) 09/14/2021  ? Chronic kidney disease (CKD), stage IV (severe) ([place]) 06/09/2021  ? Symptomatic anemia 12/23/2020  ? GI bleed 12/23/2020  ? Paroxysmal A-fib ([place]) 12/23/2020  ? Chronic diastolic CHF (congestive heart failure) ([place]) 12/23/2020  ? Acute CHF (congestive heart failure) ([place]) 09/24/2019  ? ARF (acute renal failure) ([place]) 09/24/2019  ? Anemia of chronic disease 09/24/2019  ? Acute respiratory failure with hypoxia ([place])   ? 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 Smoker 03/26/2016  ? Hypotension 01/29/2016  ? Abnormal myocardial perfusion study 01/29/2016  ? Depression 12/09/2015  ? Type 2 diabetes mellitus with hyperlipidemia ([place])   ? HTN (hypertension) 06/24/2014  ? 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 BPH (benign prostatic hyperplasia) 06/24/2014  ? ?Past Medical History:  ?Diagnosis Date  ? Anemia   ? CHF (congestive heart failure) ([place])   ? Chronic kidney disease   ? Depression   ? Diabetes mellitus without complication ([place])   ? Type II  ? GERD (gastroesophageal reflux disease)   ? History of blood transfusion   ? Hyperlipidemia   ? Hypertension   ? Thyroid disease   ? was on supplement, taken off by [name]  ? ?Past Surgical History:  ?Procedure Laterality Date  ? APPENDECTOMY    ? AV FISTULA PLACEMENT Right 09/04/2021  ? Procedure: RIGHT BRACHIOCEPHALIC ARTERIOVENOUS (AV) FISTULA CREATION;  Surgeon: [name], MD;  Location: [facility];  Service: Vascular;  Laterality: Right;  ? BIOPSY  12/25/2020  ? Procedure: BIOPSY;  Surgeon: [name]., MD;  Location: [facility];  Service: Gastroenterology;;  ? CARDIAC CATHETERIZATION N/A 01/30/2016  ? Procedure: Left Heart Cath and Coronary Angiography;  Surgeon: [name], MD;  Location: [facility];  Service: Cardiovascular;  Laterality: N/A;  ? ENTEROSCOPY N/A 12/25/2020  ? Procedure: ENTEROSCOPY;  Surgeon:  [name], MD;  Location: [facility];  Service: Gastroenterology;  Laterality: N/A;  ? GIVENS CAPSULE STUDY N/A 12/25/2020  ? Procedure: GIVENS CAPSULE STUDY;  Surgeon: [name]., MD;  Location: [facility];  Service: Gastroenterology;  Laterality: N/A;  ? SUBMUCOSAL TATTOO INJECTION  12/25/2020  ? Procedure: SUBMUCOSAL TATTOO INJECTION;  Surgeon: [name]., MD;  Location: [facility];  Service: Gastroenterology;;  ? ?Allergies  ?Allergen Reactions  ? Pork-Derived Products Other (See Comments)  ?  Patient "just does not eat this"  ? ?Prior to Admission medications   ?Medication Sig Start Date End Date Taking? Authorizing Provider  ?amiodarone (PACERONE) 200 MG tablet Take 1 tablet (200 mg total) by mouth daily. Follow up appt required for further refills. Please call [phone] to schedule an appt ?Patient taking differently: Take 200 mg by mouth daily. 01/28/21  Yes Bensimhon, Shaune Pascal, MD  ?atorvastatin (LIPITOR) 80 MG tablet TAKE 1 TABLET(80 MG) BY MOUTH DAILY ?Patient taking differently: Take 80 mg by mouth daily. 12/22/20  Yes [name], MD  ?blood glucose meter kit and supplies Use up to two times daily as directed  ICD10 E10.9 E11.9 02/20/19  Yes [name], MD  ?  CALPHRON 667 MG tablet Take 667 mg by mouth 3 (three) times daily. 11/09/21  Yes [provider]  ?cefdinir (OMNICEF) 300 MG capsule Take 1 capsule (300 mg total) by mouth daily for 3 days. 11/23/21 11/26/21 Yes Domenic Polite, MD  ?Cholecalciferol (VITAMIN D3 PO) Take 1 tablet by mouth daily.   Yes [provider]  ?citalopram (CELEXA) 20 MG tablet TAKE 1 TABLET(20 MG) BY MOUTH DAILY 11/16/21  Yes [name], MD  ?diltiazem (CARDIZEM CD) 180 MG 24 hr capsule TAKE 1 CAPSULE(180 MG) BY MOUTH DAILY ?Patient taking differently: Take 180 mg by mouth daily. 08/24/21  Yes Bensimhon, Shaune Pascal, MD  ?ELIQUIS 2.5 MG TABS tablet TAKE 1 TABLET(2.5 MG) BY MOUTH TWICE DAILY ?Patient  taking differently: Take 2.5 mg by mouth 2 (two) times daily. 10/30/21  Yes Bensimhon, Shaune Pascal, MD  ?glipiZIDE (GLUCOTROL XL) 5 MG 24 hr tablet Take 1 tablet (5 mg total) by mouth daily with breakfast. 11/22/21  Yes [name],

## 2021-11-23 NOTE — Patient Instructions (Addendum)
Continue antibiotic until finished ?Keep record of blood sugars 4 times per day  (fasting, 2 hours after each meal). Send me mesaage of those readings next 2 days to decide on changes. Continue same dose glipizide and Januvia for now.  ?

## 2021-11-24 ENCOUNTER — Other Ambulatory Visit: Payer: Self-pay | Admitting: Family Medicine

## 2021-11-24 ENCOUNTER — Encounter: Payer: Self-pay | Admitting: Family Medicine

## 2021-11-24 ENCOUNTER — Other Ambulatory Visit (INDEPENDENT_AMBULATORY_CARE_PROVIDER_SITE_OTHER): Payer: Medicare Other

## 2021-11-24 ENCOUNTER — Telehealth: Payer: Self-pay

## 2021-11-24 DIAGNOSIS — N184 Chronic kidney disease, stage 4 (severe): Secondary | ICD-10-CM | POA: Diagnosis not present

## 2021-11-24 LAB — BASIC METABOLIC PANEL
BUN: 82 mg/dL — ABNORMAL HIGH (ref 6–23)
BUN: 82 mg/dL — ABNORMAL HIGH (ref 6–23)
CO2: 21 mEq/L (ref 19–32)
CO2: 24 mEq/L (ref 19–32)
Calcium: 8.8 mg/dL (ref 8.4–10.5)
Calcium: 8.7 mg/dL (ref 8.4–10.5)
Chloride: 98 mEq/L (ref 96–112)
Chloride: 99 mEq/L (ref 96–112)
Creatinine, Ser: 4.1 mg/dL — ABNORMAL HIGH (ref 0.40–1.50)
Creatinine, Ser: 4.04 mg/dL — ABNORMAL HIGH (ref 0.40–1.50)
GFR: 13.64 mL/min — CL (ref >60.00)
GFR: 13.89 mL/min — CL (ref >60.00)
Glucose, Bld: 177 mg/dL — ABNORMAL HIGH (ref 70–99)
Glucose, Bld: 253 mg/dL — ABNORMAL HIGH (ref 70–99)
Potassium: 4 mEq/L (ref 3.5–5.1)
Potassium: 4 mEq/L (ref 3.5–5.1)
Sodium: 136 mEq/L (ref 135–145)
Sodium: 136 mEq/L (ref 135–145)

## 2021-11-24 NOTE — Telephone Encounter (Signed)
Critical Lab call in regard to patients GFR = 13.89 ?

## 2021-11-24 NOTE — Progress Notes (Signed)
Bmp ordered for increasing creatinine.  ?

## 2021-11-24 NOTE — Telephone Encounter (Signed)
Noted. See lab note.  ?

## 2021-11-24 NOTE — Telephone Encounter (Signed)
Critical Lab call in regard to patients GFR = 13.64 ? ?Please advise if any action necessary at this time.  ?

## 2021-11-24 NOTE — Telephone Encounter (Signed)
Pt wife was advised is going to consult with patient and then have labs done later today as requested.  ? ?

## 2021-11-25 ENCOUNTER — Encounter: Payer: Self-pay | Admitting: Internal Medicine

## 2021-11-25 LAB — SURGICAL PATHOLOGY

## 2021-11-25 NOTE — Telephone Encounter (Signed)
Patient reviewed advisement on MyChart over night, they will be following up tomorrow as well.  ?

## 2021-11-25 NOTE — Progress Notes (Deleted)
11/25/21, called by Pathology, biopsy read as Paraganglioma, I called [facility] and made a referral, they will call pt with an Appt ? ?Domenic Polite, MD ?

## 2021-11-26 ENCOUNTER — Other Ambulatory Visit: Payer: Self-pay | Admitting: Family Medicine

## 2021-11-26 ENCOUNTER — Telehealth: Payer: Self-pay

## 2021-11-26 ENCOUNTER — Ambulatory Visit (INDEPENDENT_AMBULATORY_CARE_PROVIDER_SITE_OTHER): Payer: Medicare Other | Admitting: Family Medicine

## 2021-11-26 VITALS — BP 138/76 | HR 78 | Temp 98.1°F | Resp 16 | Ht 66.0 in | Wt 115.8 lb

## 2021-11-26 DIAGNOSIS — E1122 Type 2 diabetes mellitus with diabetic chronic kidney disease: Secondary | ICD-10-CM

## 2021-11-26 DIAGNOSIS — D447 Neoplasm of uncertain behavior of aortic body and other paraganglia: Secondary | ICD-10-CM | POA: Diagnosis not present

## 2021-11-26 DIAGNOSIS — R739 Hyperglycemia, unspecified: Secondary | ICD-10-CM | POA: Diagnosis not present

## 2021-11-26 DIAGNOSIS — R1900 Intra-abdominal and pelvic swelling, mass and lump, unspecified site: Secondary | ICD-10-CM

## 2021-11-26 LAB — BASIC METABOLIC PANEL
BUN: 74 mg/dL — ABNORMAL HIGH (ref 6–23)
CO2: 28 mEq/L (ref 19–32)
Calcium: 9 mg/dL (ref 8.4–10.5)
Chloride: 97 mEq/L (ref 96–112)
Creatinine, Ser: 3.89 mg/dL — ABNORMAL HIGH (ref 0.40–1.50)
GFR: 14.53 mL/min — CL (ref >60.00)
Glucose, Bld: 223 mg/dL — ABNORMAL HIGH (ref 70–99)
Potassium: 3.8 mEq/L (ref 3.5–5.1)
Sodium: 138 mEq/L (ref 135–145)

## 2021-11-26 MED ORDER — GLIPIZIDE ER 2.5 MG PO TB24: 2.5000 mg | ORAL_TABLET | Freq: Every day | ORAL | 1 refills | Status: DC

## 2021-11-26 NOTE — Telephone Encounter (Signed)
Improved. Lab note sent.

## 2021-11-26 NOTE — Patient Instructions (Addendum)
Add glipizide 2.'5mg'$  for total dose 7.'5mg'$  for now. Keep a record of your blood sugars outside of the office and bring them to the next office visit. Recheck in 1 week.  Return to the clinic or go to the nearest emergency room if any of your symptoms worsen or new symptoms occur. I will recheck kidney test, and refer you to specialists to discuss plan for the paraganglioma.

## 2021-11-26 NOTE — Telephone Encounter (Signed)
Lab called with critical lab value, patients GFR is 14.53   Please advise

## 2021-11-26 NOTE — Progress Notes (Signed)
Subjective:  Patient ID: Adam Reid, male    DOB: December 14, 1945  Age: 76 y.o. MRN: [id number]  CC:  Chief Complaint  Patient presents with   Diabetes    Pt here to follow up no questions from pt    Results    Pt here to discuss results     HPI Adam Reid presents for   Here with spouse.  Feeling a little better, breathing stable, no fever. Still on O2.   Diabetes: With stage IV chronic kidney disease.  Recent hospitalization requiring sliding scale insulin.  Outpatient remained on glipizide 5 mg daily, Januvia 25 mg daily.  Elevated home readings were discussed at visit few days ago but also had some lows in the hospital.  Continue same regimen with home monitoring.  Glucose was elevated at his last visit 3 days ago at 177, then 253 2 days ago on BMP  Home readings since last visit: 2hr PP 369, 242, 264 Fasting 133, 171, 181 No lows.  No temporary nausea at times, no vomiting/blurry vision/abd pain  Lab Results  Component Value Date   HGBA1C 7.4 (H) 11/23/2021   HGBA1C 7.9 (H) 08/27/2021   HGBA1C 6.0 (A) 12/22/2020   Lab Results  Component Value Date   MICROALBUR 60.3 09/02/2015   LDLCALC 48 12/22/2020   CREATININE 4.04 (H) 11/24/2021   Stage IV chronic kidney disease With slight bump in creatinine to 4.10 May 15, ranging from 3.59-3.93 at hospitalization.  Possible slight increased after contrast for CT biopsy on May 12. Repeat creatinine 2 days ago slightly improved at 4.04.  Followed closely by nephrology, next appointment unknown - spouse called yesterday - waiting on call back.  Drinking fluids. Urinating. No new swelling. Taking torsemide.  Appt in 4 days with cardiology.  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 Note from hospitalist reviewed, initially referred to oncology.  Message from oncology yesterday.  Note yesterday from oncology review of referral, considering the location and typical next best steps recommended general surgery consult.  If deemed unresectable then patient most likely to be referred to endocrine. No BP spikes, palpitations, episodes of flushing or HA.  No abd pain.    History Patient Active Problem List   Diagnosis Date Noted   Abdominal mass 11/17/2021   CAP (community acquired pneumonia) 11/16/2021   Thyroid disease    Diabetes mellitus without complication (HCC)    Chronic kidney disease    Paroxysmal atrial fibrillation (HCC)    Protein-calorie malnutrition, severe 11/13/2021   Acute on chronic diastolic (congestive) heart failure ([place]) 11/12/2021   Iron deficiency anemia 09/16/2021   Acute on chronic diastolic CHF (congestive heart failure) ([place]) 09/14/2021   COVID-19 virus infection 09/14/2021   Acute kidney injury superimposed on chronic kidney disease ([place]) 09/14/2021   Chronic kidney disease (CKD), stage IV (severe) ([place]) 06/09/2021   Symptomatic anemia 12/23/2020   GI bleed 12/23/2020   Paroxysmal A-fib ([place]) 12/23/2020   Chronic diastolic CHF (congestive heart failure) ([employer]) 12/23/2020   Acute CHF (congestive heart failure) ([place]) 09/24/2019   ARF (acute renal failure) ([place]) 09/24/2019   Anemia of chronic disease 09/24/2019   Acute respiratory failure with hypoxia ([place])  Secondary hyperparathyroidism, renal ([place]) 12/29/2016   Smoker 03/26/2016   Hypotension 01/29/2016   Abnormal myocardial perfusion study 01/29/2016   Depression 12/09/2015   Type 2 diabetes mellitus with hyperlipidemia (HCC)    HTN (hypertension) 06/24/2014   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[place]) 06/24/2014   BPH (benign prostatic hyperplasia) 06/24/2014   Past Medical History:  Diagnosis Date   Anemia    CHF  (congestive heart failure) (HCC)    Chronic kidney disease    Depression    Diabetes mellitus without complication (HCC)    Type II   GERD (gastroesophageal reflux disease)    History of blood transfusion    Hyperlipidemia    Hypertension    Thyroid disease    was on supplement, taken off by [name]   Past Surgical History:  Procedure Laterality Date   APPENDECTOMY     AV FISTULA PLACEMENT Right 09/04/2021   Procedure: RIGHT BRACHIOCEPHALIC ARTERIOVENOUS (AV) FISTULA CREATION;  Surgeon: [name], MD;  Location: [facility];  Service: Vascular;  Laterality: Right;   BIOPSY  12/25/2020   Procedure: BIOPSY;  Surgeon: [name]., MD;  Location: [facility];  Service: Gastroenterology;;   CARDIAC CATHETERIZATION N/A 01/30/2016   Procedure: Left Heart Cath and Coronary Angiography;  Surgeon: [name], MD;  Location: [facility];  Service: Cardiovascular;  Laterality: N/A;   ENTEROSCOPY N/A 12/25/2020   Procedure: ENTEROSCOPY;  Surgeon: [name], MD;  Location: [facility];  Service: Gastroenterology;  Laterality: N/A;   GIVENS CAPSULE STUDY N/A 12/25/2020   Procedure: GIVENS CAPSULE STUDY;  Surgeon: [name]., MD;  Location: [facility];  Service: Gastroenterology;  Laterality: N/A;   SUBMUCOSAL TATTOO INJECTION  12/25/2020   Procedure: SUBMUCOSAL TATTOO INJECTION;  Surgeon: [name], MD;  Location: [facility];  Service: Gastroenterology;;   Allergies  Allergen Reactions   Pork-Derived Products Other (See Comments)    Patient "just does not eat this"   Prior to Admission medications   Medication Sig Start Date End Date Taking? Authorizing Provider  amiodarone (PACERONE) 200 MG tablet Take 1 tablet (200 mg total) by mouth daily. Follow up appt required for further refills. Please call [phone] to schedule an appt Patient taking differently: Take 200 mg by mouth daily. 01/28/21   Bensimhon, Shaune Pascal, MD   atorvastatin (LIPITOR) 80 MG tablet TAKE 1 TABLET(80 MG) BY MOUTH DAILY Patient taking differently: Take 80 mg by mouth daily. 12/22/20   [name], MD  blood glucose meter kit and supplies Use up to two times daily as directed  ICD10 E10.9 E11.9 02/20/19   [name], MD  CALPHRON 667 MG tablet Take 667 mg by mouth 3 (three) times daily. 11/09/21   [provider]  cefdinir (OMNICEF) 300 MG capsule Take 1 capsule (300 mg total) by mouth daily for 3 days. 11/23/21 11/26/21  Domenic Polite, MD  Cholecalciferol (VITAMIN D3 PO) Take 1 tablet by mouth daily.    [provider]  citalopram (CELEXA) 20 MG tablet TAKE 1 TABLET(20 MG) BY MOUTH DAILY 11/16/21   [name], MD  diltiazem (CARDIZEM CD) 180 MG 24 hr capsule TAKE 1 CAPSULE(180 MG) BY MOUTH DAILY Patient taking differently: Take 180 mg by mouth daily. 08/24/21   Bensimhon, Shaune Pascal, MD  ELIQUIS 2.5 MG TABS tablet TAKE 1 TABLET(2.5 MG) BY MOUTH TWICE DAILY Patient taking differently: Take 2.5 mg by mouth 2 (two)  times daily. 10/30/21   Bensimhon, Shaune Pascal, MD  glipiZIDE (GLUCOTROL XL) 5 MG 24 hr tablet Take 1 tablet (5 mg total) by mouth daily with breakfast. 11/22/21   Domenic Polite, MD  GlucoCom Lancets MISC Use for home glucose monitoring 09/02/15   Harrison Mons, PA  glucose blood (ONETOUCH VERIO) test strip Test up to 2 times per day.  Uncontrolled diabetes with hyperglycemia and stage 4 CKD. 04/13/19   [name], MD  polyethylene glycol (MIRALAX / GLYCOLAX) 17 g packet Take 17 g by mouth daily as needed for mild constipation or moderate constipation (MIX AND DRINK).    [provider]  sitaGLIPtin (JANUVIA) 25 MG tablet TAKE 1 TABLET(25 MG) BY MOUTH DAILY Patient taking differently: Take 25 mg by mouth daily. 08/31/21   [name], MD  sodium bicarbonate 650 MG tablet Take 650 mg by mouth 2 (two) times daily.    [provider]  tamsulosin (FLOMAX) 0.4 MG CAPS capsule Take  0.4 mg by mouth daily.    [provider]  torsemide (DEMADEX) 20 MG tablet Take 2 tablets (40 mg total) by mouth 2 (two) times daily. 10/05/21   [place], Maricela Bo, FNP  vitamin B-12 (CYANOCOBALAMIN) 500 MCG tablet Take 500 mcg by mouth daily.    [provider]   Social History   Socioeconomic History   Marital status: Married    Spouse name: [name]   Number of children: 1   Years of education: 12th grade   Highest education level: Not on file  Occupational History   Occupation: Retired-accountant  Tobacco Use   Smoking status: Light Smoker    Packs/day: 0.10    Years: 48.00    Pack years: 4.80    Types: Cigarettes    Last attempt to quit: 07/16/2019    Years since quitting: 2.3   Smokeless tobacco: Never   Tobacco comments:    09/03/22 Wife states, "he hides from me, so I am not sure how much he smokes."  Vaping Use   Vaping Use: Never used  Substance and Sexual Activity   Alcohol use: No    Alcohol/week: 0.0 standard drinks   Drug use: No   Sexual activity: Never    Partners: Female  Other Topics Concern   Not on file  Social History Narrative   Originally from [place]. Came to the [place] in 2009, following their son who came here for school.   Married.   Lives with his wife.   Their adult son lives in [place], [place], where he is in optometry school.   Education: [employer]   Exercise: No   Social Determinants of Radio broadcast assistant Strain: Not on file  Food Insecurity: Not on file  Transportation Needs: Not on file  Physical Activity: Not on file  Stress: Not on file  Social Connections: Not on file  Intimate Partner Violence: Not on file    Review of Systems Per HPI.   Objective:   Vitals:   11/26/21 0808 11/26/21 0813  BP: 138/76   Pulse: 78   Resp: 16   Temp: 98.1 F (36.7 C)   TempSrc: Temporal   SpO2: (!) 84% 95%  Weight: 115 lb 12.8 oz (52.5 kg)   Height: $Remove'5\' 6"'xuFLUdF$  (1.676 m)   Repeat on 2LO2   Physical Exam Vitals reviewed.   Constitutional:      Appearance: He is well-developed.  HENT:     Head: Normocephalic and atraumatic.  Neck:  Vascular: No carotid bruit or JVD.  Cardiovascular:     Rate and Rhythm: Normal rate and regular rhythm.     Heart sounds: Normal heart sounds. No murmur heard. Pulmonary:     Effort: Pulmonary effort is normal.     Breath sounds: Normal breath sounds. No rales.  Abdominal:     General: There is no distension.     Tenderness: There is no abdominal tenderness. There is no right CVA tenderness or left CVA tenderness.  Musculoskeletal:     Right lower leg: No edema.     Left lower leg: No edema.  Skin:    General: Skin is warm and dry.  Neurological:     Mental Status: He is alert and oriented to person, place, and time.  Psychiatric:        Mood and Affect: Mood normal.    Assessment & Plan:  Dylyn Mclaren is a 76 y.o. male . Type 2 diabetes mellitus with chronic kidney disease, without long-term current use of insulin, unspecified CKD stage ([place]) - Plan: glipiZIDE (GLUCOTROL XL) 2.5 MG 24 hr tablet, Basic metabolic panel, Ambulatory referral to Endocrinology  -Uncontrolled by home readings.  We will cautiously increase coverage given history of hypoglycemia, CKD.  Add additional 2.5 mg glipizide with close monitoring, recheck 1 week.  Refer to endocrinology as well.  RTC/ER precautions if hypoglycemia or significant increases during that time.  -Keep follow-up with nephrology, spouse calling to check status of appointment.  Slight bump in creatinine now improving.  Hyperglycemia - Plan: glipiZIDE (GLUCOTROL XL) 2.5 MG 24 hr tablet, Ambulatory referral to Endocrinology  -As above with med adjustment.  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   Oncology recommended initial referral to general surgery but will also refer to endocrine to decide on next step in treatment.   Meds ordered this encounter  Medications   glipiZIDE (GLUCOTROL XL) 2.5 MG 24 hr tablet    Sig: Take 1 tablet (2.5 mg total) by mouth daily with breakfast. With $RemoveBeforeD'5mg'YfSOObgWYoAUsU$  for total dose of 7.$RemoveB'5mg'ccGYcMPs$  qd.    Dispense:  30 tablet    Refill:  1   Patient Instructions  Add glipizide 2.$RemoveBeforeD'5mg'jERPPMHKyQsBhA$  for total dose 7.$RemoveBefo'5mg'oEfhVCxmZzm$  for now. Keep a record of your blood sugars outside of the office and bring them to the next office visit. Recheck in 1 week.  Return to the clinic or go to the nearest emergency room if any of your symptoms worsen or new symptoms occur. I will recheck kidney test, and refer you to specialists to discuss plan for the paraganglioma.        Signed,   Merri Ray, MD [place], [facility] 11/26/21 9:08 AM

## 2021-11-27 ENCOUNTER — Telehealth: Payer: Self-pay

## 2021-11-27 NOTE — Telephone Encounter (Signed)
Patient son is calling to see if there is anyway to get the referral as soon as possible. Patient states his dad sugars have been in the 300 range and it concerns him.

## 2021-11-29 NOTE — Progress Notes (Signed)
ADVANCED HF CLINIC PROGRESS NOTE   Primary Care: Merri Ray MD [facility]) Primary Cardiologist: Dr. Margaretann Loveless HF Cardiologist: Dr. Haroldine Laws   HPI: Adam Reid is a 76 y.o. male from [place] with a h/o a tobacco abuse (quit 2021), HTN, DM2, CKD 4 (Cr ~9.3), diastolic HF, PAF and iron-deficiency anemia, recently referred by Dr. 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   Had [facility] 01/2016 for CP evaluation revealing mild nonobstructive CAD, LAD 20%, D2 50%, OM 20%  Had COVID in 2019. Not too many symptoms except for fatigue.   Hospitalized 8/10-17/51 for diastolic HF. Diuresed and weight down to 129 pounds. Sent home on lasix 60 daily. Echo LVEF 55-60%, RV ok. Restrictive filling pattern. There was also concern about cardiac amyloidosis given restrictive filling pattern on echo.  Multiple myeloma panel obtained and immunofixation did not demonstrate monoclonal protein spike. PYP completed and negative for TTR amyloid.   Zio 6/21 1. Sinus rhythm predominates - avg HR of 80 bpm. 2. Two runs NSVT - the longest lasting 6 beats with an avg rate of 124 bpm. 3. 99 Supraventricular Tachycardia/atrial tach runs occurred the longest lasting 45.5 secs with an avg rate of 105 bpm. 4. No evidence of atrial fib 5. Rare PVCs and PACs.  6. Several patient-triggered events associated with sinus rhythm   In 6/22 had capsule endoscopy which showed several small bowel AVMs. Previous EGD and colon ok.   Follow up 11/22, doing well with maintenance of NSR, volume stable. Discussed HD if renal function deteriorates.  Echo (1/23): EF 55-60%, mild LVH, normal RV.  S/p right AVF placement (2/23).  Admitted 3/23 with a/c CHF, complicated by AKI on CKD. Incidentally found to be COVID +. Diuresed with IV lasix. Nephrology consulted but no absolute indication for HD. Discharged on home dose torsemide 20 mg, weight 119 lbs.  Hospital follow up 10/05/21, weight up 5 lbs and volume overloaded.  Torsemide increased to 40 bid and Farxiga stopped with GFR 14. Arranged for home oxygen, 84% on room air at visit.   Admitted 11/12/21 with A/C HFpEF and CAP.  Diuresed with IV lasix and later transitioned to torsemide 40 mg twice a day. Treated with antibiotics. CT and ultrasound noted left retroperitoneal mass, 6.7X 5.4 cm with central necrosis. IR completed  biopsy.   Today he returns for HF follow up with his wife. Overall feeling fine. Does get tired after walking.Denies SOB/PND/Orthopnea. No bleeding issues.Appetite ok. No fever or chills. Weight at home 115-118  pounds. Taking all medications.   Echo (11/30/21) : Ef 55-60%  RV normal  Grade III DD Echo (1/23): EF 55-60%, mild LVH, normal RV.  ROS: All systems reviewed and negative except as per HPI.    Past Medical History:  Diagnosis Date   Anemia    CHF (congestive heart failure) (HCC)    Chronic kidney disease    Depression    Diabetes mellitus without complication (HCC)    Type II   GERD (gastroesophageal reflux disease)    History of blood transfusion    Hyperlipidemia    Hypertension    Thyroid disease    was on supplement, taken off by [name]    Current Outpatient Medications  Medication Sig Dispense Refill   amiodarone (PACERONE) 200 MG tablet Take 1 tablet (200 mg total) by mouth daily. Follow up appt required for further refills. Please call [phone] to schedule an appt 30 tablet 4   atorvastatin (LIPITOR) 80 MG tablet TAKE  1 TABLET(80 MG) BY MOUTH DAILY 90 tablet 1   blood glucose meter kit and supplies Use up to two times daily as directed  ICD10 E10.9 E11.9 1 each 0   CALPHRON 667 MG tablet Take 667 mg by mouth 3 (three) times daily.     Cholecalciferol (VITAMIN D3 PO) Take 1 tablet by mouth daily.     citalopram (CELEXA) 20 MG tablet TAKE 1 TABLET(20 MG) BY MOUTH DAILY 90 tablet 0   diltiazem (CARDIZEM CD) 180 MG 24 hr capsule TAKE 1 CAPSULE(180 MG) BY MOUTH DAILY 60 capsule 2   ELIQUIS 2.5 MG TABS tablet  TAKE 1 TABLET(2.5 MG) BY MOUTH TWICE DAILY 180 tablet 3   glipiZIDE (GLUCOTROL XL) 2.5 MG 24 hr tablet Take 1 tablet (2.5 mg total) by mouth daily with breakfast. With $RemoveBeforeD'5mg'pWvIMGXiHMOtJc$  for total dose of 7.$RemoveB'5mg'YBnccTKC$  qd. 30 tablet 1   glipiZIDE (GLUCOTROL XL) 5 MG 24 hr tablet Take 1 tablet (5 mg total) by mouth daily with breakfast.     GlucoCom Lancets MISC Use for home glucose monitoring 100 each 3   glucose blood (ONETOUCH VERIO) test strip Test up to 2 times per day.  Uncontrolled diabetes with hyperglycemia and stage 4 CKD. 100 each 3   polyethylene glycol (MIRALAX / GLYCOLAX) 17 g packet Take 17 g by mouth daily as needed for mild constipation or moderate constipation (MIX AND DRINK).     sitaGLIPtin (JANUVIA) 25 MG tablet TAKE 1 TABLET(25 MG) BY MOUTH DAILY 90 tablet 1   sodium bicarbonate 650 MG tablet Take 650 mg by mouth 2 (two) times daily.     tamsulosin (FLOMAX) 0.4 MG CAPS capsule Take 0.4 mg by mouth daily.     torsemide (DEMADEX) 20 MG tablet Take 2 tablets (40 mg total) by mouth 2 (two) times daily. 120 tablet 6   vitamin B-12 (CYANOCOBALAMIN) 500 MCG tablet Take 500 mcg by mouth daily.     No current facility-administered medications for this encounter.   Allergies  Allergen Reactions   Pork-Derived Products Other (See Comments)    Patient "just does not eat this"   Social History   Socioeconomic History   Marital status: Married    Spouse name: [name]   Number of children: 1   Years of education: 12th grade   Highest education level: Not on file  Occupational History   Occupation: Retired-accountant  Tobacco Use   Smoking status: Light Smoker    Packs/day: 0.10    Years: 48.00    Pack years: 4.80    Types: Cigarettes    Last attempt to quit: 07/16/2019    Years since quitting: 2.3   Smokeless tobacco: Never   Tobacco comments:    09/03/22 Wife states, "he hides from me, so I am not sure how much he smokes."  Vaping Use   Vaping Use: Never used  Substance and Sexual Activity    Alcohol use: No    Alcohol/week: 0.0 standard drinks   Drug use: No   Sexual activity: Never    Partners: Female  Other Topics Concern   Not on file  Social History Narrative   Originally from [place]. Came to the [place] in 2009, following their son who came here for school.   Married.   Lives with his wife.   Their adult son lives in [place], [place], where he is in optometry school.   Education: [employer]   Exercise: No   Social Determinants of Radio broadcast assistant  Strain: Not on file  Food Insecurity: Not on file  Transportation Needs: Not on file  Physical Activity: Not on file  Stress: Not on file  Social Connections: Not on file  Intimate Partner Violence: Not on file   Family History  Problem Relation Age of Onset   Hypertension Mother    Hyperlipidemia Mother    Hypertension Sister    Kidney disease Father    Heart disease Brother 79       open heart surgery   Colon cancer Neg Hx    BP 122/66   Pulse 65   Wt 53.5 kg (118 lb)   SpO2 96%   BMI 19.05 kg/m   Wt Readings from Last 3 Encounters:  11/30/21 53.5 kg (118 lb)  11/26/21 52.5 kg (115 lb 12.8 oz)  11/23/21 53.3 kg (117 lb 9.6 oz)   Physical Exam: General:  Thin. No resp difficulty HEENT: normal Neck: supple. no JVD. Carotids 2+ bilat; no bruits. No lymphadenopathy or thryomegaly appreciated. Cor: PMI nondisplaced. Regular rate & rhythm. No rubs, gallops or murmurs. Lungs: clear Abdomen: soft, nontender, nondistended. No hepatosplenomegaly. No bruits or masses. Good bowel sounds. Extremities: no cyanosis, clubbing, rash, edema. RUE AVF Neuro: alert & orientedx3, cranial nerves grossly intact. moves all 4 extremities w/o difficulty. Affect pleasant  EKG: SR 1st degree heart block 80 bpm  PR interval 260 ms   Assessment & Plan: 1.Chronic Diastolic HF - Echo 9/17: EF 55-60% with restrictive filling pattern - PYP scan 4/21 negative for TTR amyloid  - Based on history, ECG, echo findings (normal RV)  and concomitant renal failure, suspect this is due to longstanding HTN +/- DM2 changes  - Echo (1/23): EF 55-60%, mild LVH, normal RV. Much improved with maintenance of NSR. - NYHA II. Volume status stable.  - Continue torsemide 20 mg bid.Instructed to take an extra 20 mg of torsemide as needed.  - Off Farxiga with GFR 14.  2. 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 - Continue Eliquis 2.5 bid. No bleeding issues.   3. HTN -Stable.   4. CKD Stage IV  - due to HTN and DM2 - Followed by Dr. Elmarie Shiley. Has appt next week. - Last creatinine 3.8 (GFR 14.9)  - Now s/p AVF. No HD yet.  5. H/o Hypoxia - Sats stable on 2 liters   6. DM2 - per PCP. - On sitagliptin and glipizide.  7. IDA - Nephrology following and treating w/ IV Fe infusions and aranesp. - Had EGD/colon in 5/21. 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 Non-obstructive CAD - No s/s angina. - Off ASA with Eliquis.  - Continue statin.    Follow up with Dr Haroldine Laws in 3 months.  Darrick Grinder, NP  3:23 PM

## 2021-11-30 ENCOUNTER — Encounter (HOSPITAL_COMMUNITY): Payer: Self-pay

## 2021-11-30 ENCOUNTER — Ambulatory Visit (HOSPITAL_COMMUNITY)
Admit: 2021-11-30 | Discharge: 2021-11-30 | Disposition: A | Discharge status: Home / Self Care | Payer: Medicare Other | Attending: Adult Health | Admitting: Adult Health

## 2021-11-30 VITALS — BP 122/66 | HR 65 | Wt 118.0 lb

## 2021-11-30 DIAGNOSIS — I2510 Atherosclerotic heart disease of native coronary artery without angina pectoris: Secondary | ICD-10-CM | POA: Insufficient documentation

## 2021-11-30 DIAGNOSIS — I129 Hypertensive chronic kidney disease with stage 1 through stage 4 chronic kidney disease, or unspecified chronic kidney disease: Secondary | ICD-10-CM | POA: Diagnosis not present

## 2021-11-30 DIAGNOSIS — I770 Arteriovenous fistula, acquired: Secondary | ICD-10-CM | POA: Diagnosis not present

## 2021-11-30 DIAGNOSIS — I5033 Acute on chronic diastolic (congestive) heart failure: Secondary | ICD-10-CM | POA: Diagnosis not present

## 2021-11-30 DIAGNOSIS — I480 Paroxysmal atrial fibrillation: Secondary | ICD-10-CM | POA: Diagnosis not present

## 2021-11-30 DIAGNOSIS — I5030 Unspecified diastolic (congestive) heart failure: Secondary | ICD-10-CM | POA: Diagnosis not present

## 2021-11-30 DIAGNOSIS — I5032 Chronic diastolic (congestive) heart failure: Secondary | ICD-10-CM | POA: Insufficient documentation

## 2021-11-30 DIAGNOSIS — N184 Chronic kidney disease, stage 4 (severe): Secondary | ICD-10-CM | POA: Diagnosis not present

## 2021-11-30 DIAGNOSIS — I130 Hypertensive heart and chronic kidney disease with heart failure and stage 1 through stage 4 chronic kidney disease, or unspecified chronic kidney disease: Secondary | ICD-10-CM | POA: Insufficient documentation

## 2021-11-30 DIAGNOSIS — Z79899 Other long term (current) drug therapy: Secondary | ICD-10-CM | POA: Diagnosis not present

## 2021-11-30 DIAGNOSIS — E1122 Type 2 diabetes mellitus with diabetic chronic kidney disease: Secondary | ICD-10-CM | POA: Diagnosis not present

## 2021-11-30 DIAGNOSIS — N2581 Secondary hyperparathyroidism of renal origin: Secondary | ICD-10-CM | POA: Diagnosis not present

## 2021-11-30 DIAGNOSIS — Z7901 Long term (current) use of anticoagulants: Secondary | ICD-10-CM | POA: Insufficient documentation

## 2021-11-30 DIAGNOSIS — D447 Neoplasm of uncertain behavior of aortic body and other paraganglia: Secondary | ICD-10-CM | POA: Diagnosis not present

## 2021-11-30 DIAGNOSIS — E1129 Type 2 diabetes mellitus with other diabetic kidney complication: Secondary | ICD-10-CM | POA: Diagnosis not present

## 2021-11-30 DIAGNOSIS — D631 Anemia in chronic kidney disease: Secondary | ICD-10-CM | POA: Diagnosis not present

## 2021-11-30 DIAGNOSIS — N189 Chronic kidney disease, unspecified: Secondary | ICD-10-CM | POA: Diagnosis not present

## 2021-11-30 MED ORDER — AMIODARONE HCL 200 MG PO TABS: 200.0000 mg | ORAL_TABLET | Freq: Every day | ORAL | 6 refills | Status: AC

## 2021-11-30 NOTE — Patient Instructions (Addendum)
No change in medications A refill for Amiodarone has been sent to your pharmacy Return to clinic for a visit with DR Bensimhon in 1 month

## 2021-12-01 ENCOUNTER — Encounter: Payer: Self-pay | Admitting: Family Medicine

## 2021-12-01 NOTE — Telephone Encounter (Signed)
Called pt wife and informed.

## 2021-12-01 NOTE — Telephone Encounter (Signed)
I understand the concern.  We did increase meds last visit, but with history of hypoglycemia need to be cautious on going up too quickly too soon.  It may take a while to get into endocrinology, but I can certainly adjust medications in the interim.  I do not see another appointment until June.  If blood sugars are in the 300s, please schedule for acute visit next few days to make further changes.  Thanks.

## 2021-12-02 ENCOUNTER — Telehealth: Payer: Self-pay

## 2021-12-02 NOTE — Telephone Encounter (Signed)
Received referral from Dr. [name] requesting fistulagram to treat area of stenosis - To scheduling

## 2021-12-04 ENCOUNTER — Encounter (HOSPITAL_COMMUNITY): Payer: Medicare Other

## 2021-12-05 DIAGNOSIS — I5032 Chronic diastolic (congestive) heart failure: Secondary | ICD-10-CM | POA: Diagnosis not present

## 2021-12-10 ENCOUNTER — Other Ambulatory Visit (INDEPENDENT_AMBULATORY_CARE_PROVIDER_SITE_OTHER): Payer: Medicare Other

## 2021-12-10 ENCOUNTER — Ambulatory Visit (INDEPENDENT_AMBULATORY_CARE_PROVIDER_SITE_OTHER): Payer: Medicare Other | Admitting: Family Medicine

## 2021-12-10 ENCOUNTER — Telehealth: Payer: Self-pay

## 2021-12-10 ENCOUNTER — Encounter: Payer: Self-pay | Admitting: Family Medicine

## 2021-12-10 ENCOUNTER — Ambulatory Visit (INDEPENDENT_AMBULATORY_CARE_PROVIDER_SITE_OTHER)
Admission: RE | Admit: 2021-12-10 | Discharge: 2021-12-10 | Disposition: A | Discharge status: Home / Self Care | Payer: Medicare Other | Source: Ambulatory Visit | Attending: Family Medicine | Admitting: Family Medicine

## 2021-12-10 VITALS — BP 122/74 | HR 85 | Temp 97.8°F | Resp 16 | Ht 66.0 in | Wt 120.6 lb

## 2021-12-10 DIAGNOSIS — R739 Hyperglycemia, unspecified: Secondary | ICD-10-CM

## 2021-12-10 DIAGNOSIS — J90 Pleural effusion, not elsewhere classified: Secondary | ICD-10-CM | POA: Diagnosis not present

## 2021-12-10 DIAGNOSIS — R600 Localized edema: Secondary | ICD-10-CM | POA: Diagnosis not present

## 2021-12-10 DIAGNOSIS — J9611 Chronic respiratory failure with hypoxia: Secondary | ICD-10-CM

## 2021-12-10 DIAGNOSIS — N184 Chronic kidney disease, stage 4 (severe): Secondary | ICD-10-CM | POA: Diagnosis not present

## 2021-12-10 DIAGNOSIS — I509 Heart failure, unspecified: Secondary | ICD-10-CM | POA: Diagnosis not present

## 2021-12-10 DIAGNOSIS — R079 Chest pain, unspecified: Secondary | ICD-10-CM | POA: Diagnosis not present

## 2021-12-10 LAB — BASIC METABOLIC PANEL
BUN: 110 mg/dL (ref 6–23)
CO2: 21 mEq/L (ref 19–32)
Calcium: 8.8 mg/dL (ref 8.4–10.5)
Chloride: 102 mEq/L (ref 96–112)
Creatinine, Ser: 4.85 mg/dL (ref 0.40–1.50)
GFR: 11.15 mL/min — CL (ref >60.00)
Glucose, Bld: 98 mg/dL (ref 70–99)
Potassium: 3.7 mEq/L (ref 3.5–5.1)
Sodium: 141 mEq/L (ref 135–145)

## 2021-12-10 NOTE — Progress Notes (Unsigned)
Subjective:  Patient ID: Adam Reid, male    DOB: 05-24-1946  Age: 76 y.o. MRN: [id number]  CC:  Chief Complaint  Patient presents with   Diabetes    Pt here for 1 week follow up. Pt states his feet have been swelling for almost a week now.     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  Follow-up from 5/18th visit. And recently been hospitalized requiring sliding scale insulin, transition to glipizide 5 mg daily, Januvia 25 mg daily for outpatient diabetes treatment.  Did have some elevated home readings but also hypoglycemia during hospitalization and variable glycemic control previously.  Slowly adjusting medications given his CKD and risk of hypoglycemia.  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  Additional glipizide 2.5 mg added for total dose of 7.5 mg last visit - takes with breakfast. Continued Januvia same dose.  Home readings: Fasting: 133, 137. 117. No symptomatic lows. Lowest 117. Postprandial: 313 few days ago. 260 yesterday after breakfast.  Nausea at times, no vomiting.  Lab Results  Component Value Date   HGBA1C 7.4 (H) 11/23/2021    Pedal edema: With CKD stage 4, not yet on dialysis.  Notes feet have been swollen since last week - past 5 days.  Weight is up 5 pounds from May 18 visit. Current dose of torsemide 77m - 2BID As of last visit was continued on torsemide for edema, CKD with plan to close follow-up with nephrology - Dr. PPosey Pronto  Saw PA with nephrology about a week ago. Had bloodwork, no med changes. Option to increase torsemide to 632mBID if swelling recommended until swelling improved - has not tried this change yet. More shortness of breath past week with leg swelling. Remaining on 2 liters O2 91-95%. 2.5 liters to take shower.  Denies chest pain. Ribs sore at times, no pressure.  Prior  pleural effusions - small to moderate on CXR 11/19/21, acute on chronic diastolic CHF at that time. Treated for pneumonia at recent hospitalization.  Min cough past few days, no fever.  Has not yet taken torsemide yet today.   Wt Readings from Last 3 Encounters:  12/10/21 120 lb 9.6 oz (54.7 kg)  11/30/21 118 lb (53.5 kg)  11/26/21 115 lb 12.8 oz (52.5 kg)     History Patient Active Problem List   Diagnosis Date Noted   Abdominal mass 11/17/2021   CAP (community acquired pneumonia) 11/16/2021   Thyroid disease    Diabetes mellitus without complication (HCC)    Chronic kidney disease    Paroxysmal atrial fibrillation (HCC)    Protein-calorie malnutrition, severe 11/13/2021   Acute on chronic diastolic (congestive) heart failure (HCB[facility]05/10/2021   Iron deficiency anemia 09/16/2021   Acute on chronic diastolic CHF (congestive heart failure) (HCSalamatof03/12/2021   COVID-19 virus infection 09/14/2021   Acute kidney injury superimposed on chronic kidney disease (HCI[facility]03/12/2021   Chronic kidney disease (CKD), stage IV (severe) (HCPalestine11/29/2022   Symptomatic anemia 12/23/2020   GI bleed 12/23/2020   Paroxysmal A-fib (HCL[facility]06/14/2022   Chronic diastolic CHF (congestive heart failure) (HCSouth Wind[facility]06/14/2022   Acute CHF (congestive heart failure) (HCBarry03/15/2021   ARF (acute renal failure) (HCThorn[facility]03/15/2021   Anemia of chronic disease 09/24/2019   Acute respiratory failure with hypoxia (HCPoint [place]   Secondary hyperparathyroidism, renal (HCDownieville-Lawson-Dumont06/20/2018   Smoker 03/26/2016  Hypotension 01/29/2016   Abnormal myocardial perfusion study 01/29/2016   Depression 12/09/2015   Type 2 diabetes mellitus with hyperlipidemia (HCC)    HTN (hypertension) 06/24/2014   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[place]) 06/24/2014   BPH (benign prostatic hyperplasia) 06/24/2014   Past Medical History:  Diagnosis Date   Anemia    CHF (congestive heart failure) (HCC)    Chronic kidney  disease    Depression    Diabetes mellitus without complication (HCC)    Type II   GERD (gastroesophageal reflux disease)    History of blood transfusion    Hyperlipidemia    Hypertension    Thyroid disease    was on supplement, taken off by Dr Adam Reid   Past Surgical History:  Procedure Laterality Date   APPENDECTOMY     AV FISTULA PLACEMENT Right 09/04/2021   Procedure: RIGHT BRACHIOCEPHALIC ARTERIOVENOUS (AV) FISTULA CREATION;  Surgeon: [name], MD;  Location: [place];  Service: Vascular;  Laterality: Right;   BIOPSY  12/25/2020   Procedure: BIOPSY;  Surgeon: [name]., MD;  Location: [place];  Service: Gastroenterology;;   CARDIAC CATHETERIZATION N/A 01/30/2016   Procedure: Left Heart Cath and Coronary Angiography;  Surgeon: [name], MD;  Location: [facility];  Service: Cardiovascular;  Laterality: N/A;   ENTEROSCOPY N/A 12/25/2020   Procedure: ENTEROSCOPY;  Surgeon: [name], MD;  Location: [place] [facility];  Service: Gastroenterology;  Laterality: N/A;   GIVENS CAPSULE STUDY N/A 12/25/2020   Procedure: GIVENS CAPSULE STUDY;  Surgeon: [name]., MD;  Location: [facility];  Service: Gastroenterology;  Laterality: N/A;   SUBMUCOSAL TATTOO INJECTION  12/25/2020   Procedure: SUBMUCOSAL TATTOO INJECTION;  Surgeon: [name], MD;  Location: [facility];  Service: Gastroenterology;;   Allergies  Allergen Reactions   Pork-Derived Products Other (See Comments)    Patient "just does not eat this"   Prior to Admission medications   Medication Sig Start Date End Date Taking? Authorizing Provider  amiodarone (PACERONE) 200 MG tablet Take 1 tablet (200 mg total) by mouth daily. Follow up appt required for further refills. Please call [phone] to schedule an appt 11/30/21  Yes Clegg, Amy D, NP  atorvastatin (LIPITOR) 80 MG tablet TAKE 1 TABLET(80 MG) BY MOUTH DAILY 12/22/20  Yes [name], MD   blood glucose meter kit and supplies Use up to two times daily as directed  ICD10 E10.9 E11.9 02/20/19  Yes [name], MD  CALPHRON 667 MG tablet Take 667 mg by mouth 3 (three) times daily. 11/09/21  Yes [provider]  Cholecalciferol (VITAMIN D3 PO) Take 1 tablet by mouth daily.   Yes [provider]  citalopram (CELEXA) 20 MG tablet TAKE 1 TABLET(20 MG) BY MOUTH DAILY 11/16/21  Yes [name], MD  diltiazem (CARDIZEM CD) 180 MG 24 hr capsule TAKE 1 CAPSULE(180 MG) BY MOUTH DAILY 08/24/21  Yes Bensimhon, Shaune Pascal, MD  ELIQUIS 2.5 MG TABS tablet TAKE 1 TABLET(2.5 MG) BY MOUTH TWICE DAILY 10/30/21  Yes Bensimhon, Shaune Pascal, MD  glipiZIDE (GLUCOTROL XL) 2.5 MG 24 hr tablet Take 1 tablet (2.5 mg total) by mouth daily with breakfast. With 83m for total dose of 7.589mqd. 11/26/21  Yes GrWendie AgresteMD  glipiZIDE (GLUCOTROL XL) 5 MG 24 hr tablet Take 1 tablet (5 mg total) by mouth daily with breakfast. 11/22/21  Yes JoDomenic PoliteMD  GlucoCom Lancets [place]  Use for home glucose monitoring 09/02/15  Yes Jeffery, Chelle, PA  glucose blood (ONETOUCH VERIO) test strip Test up to 2 times per day.  Uncontrolled diabetes with hyperglycemia and stage 4 CKD. 04/13/19  Yes [name], MD  polyethylene glycol (MIRALAX / GLYCOLAX) 17 g packet Take 17 g by mouth daily as needed for mild constipation or moderate constipation (MIX AND DRINK).   Yes [provider]  sitaGLIPtin (JANUVIA) 25 MG tablet TAKE 1 TABLET(25 MG) BY MOUTH DAILY 08/31/21  Yes [name], MD  sodium bicarbonate 650 MG tablet Take 650 mg by mouth 2 (two) times daily.   Yes [provider]  tamsulosin (FLOMAX) 0.4 MG CAPS capsule Take 0.4 mg by mouth daily.   Yes [provider]  torsemide (DEMADEX) 20 MG tablet Take 2 tablets (40 mg total) by mouth 2 (two) times daily. 10/05/21  Yes Milford, Maricela Bo, FNP  vitamin B-12 (CYANOCOBALAMIN) 500 MCG tablet Take 500 mcg by mouth daily.    Yes [provider]   Social History   Socioeconomic History   Marital status: Married    Spouse name: [name]   Number of children: 1   Years of education: 12th grade   Highest education level: Not on file  Occupational History   Occupation: Retired-accountant  Tobacco Use   Smoking status: Light Smoker    Packs/day: 0.10    Years: 48.00    Pack years: 4.80    Types: Cigarettes    Last attempt to quit: 07/16/2019    Years since quitting: 2.4   Smokeless tobacco: Never   Tobacco comments:    09/03/22 Wife states, "he hides from me, so I am not sure how much he smokes."  Vaping Use   Vaping Use: Never used  Substance and Sexual Activity   Alcohol use: No    Alcohol/week: 0.0 standard drinks   Drug use: No   Sexual activity: Never    Partners: Female  Other Topics Concern   Not on file  Social History Narrative   Originally from [place]. Came to the [place] in 2009, following their son who came here for school.   Married.   Lives with his wife.   Their adult son lives in [place], [place], where he is in optometry school.   Education: [employer]   Exercise: No   Social Determinants of Radio broadcast assistant Strain: Not on file  Food Insecurity: Not on file  Transportation Needs: Not on file  Physical Activity: Not on file  Stress: Not on file  Social Connections: Not on file  Intimate Partner Violence: Not on file    Review of Systems Per HPI.   Objective:   Vitals:   12/10/21 1124  BP: 122/74  Pulse: 85  Resp: 16  Temp: 97.8 F (36.6 C)  TempSrc: Temporal  SpO2: 96%  Weight: 120 lb 9.6 oz (54.7 kg)  Height: _0  (1.676 m)     Physical Exam Vitals reviewed.  Constitutional:      Appearance: He is well-developed.  HENT:     Head: Normocephalic and atraumatic.  Neck:     Vascular: No carotid bruit or JVD.  Cardiovascular:     Rate and Rhythm: Normal rate and regular rhythm.     Heart sounds: Normal heart sounds. No murmur heard. Pulmonary:      Effort: Pulmonary effort is normal.     Breath sounds: Rales (few faint rales at base, otherwise clear) present.  Musculoskeletal:     Right lower leg: Edema (pitting edema to knees bilaterally.) present.     Left lower leg: Edema present.  Skin:    General: Skin is warm and dry.  Neurological:     Mental Status: He is alert and oriented to person, place, and time.  Psychiatric:        Mood and Affect: Mood normal.       Assessment & Plan:  Josiah Nieto is a 76 y.o. male . Hyperglycemia  Chronic kidney disease (CKD), stage IV (severe) ([place]) - Plan: Basic metabolic panel  Daytime somnolence  Pedal edema - Plan: Pro b natriuretic peptide  Chronic respiratory failure with hypoxia (HCC) - Plan: Pro b natriuretic peptide, DG Chest 2 View   No orders of the defined types were placed in this encounter.  Patient Instructions  Have xray and labs at [facility] today.  Once I see results we can discuss other med changes for diabetes. . Increase torsemide to 3 pills twice per day for now, then back to 2 pills when swelling improves. If any chest pain, worsening shortness of breath, vomiting or worse nausea, or other worsening symptoms proceed to emergency room.  Return to the clinic or go to the nearest emergency room if any of your symptoms worsen or new symptoms occur.  [facility] Walk in 8:30-4:30 during weekdays, no appointment needed [place].  [place], [place] [address]       Signed,   Merri Ray, MD [place] 12/10/21 12:37 PM

## 2021-12-10 NOTE — Telephone Encounter (Signed)
Noted. Hx of CKD 4, elevated creatinine compared to prior level but reported recent nephrology visit.  We will try to coordinate with that office Adam Reid to determine most recent levels at that time and potential further changes in plan from today's visit.

## 2021-12-10 NOTE — Patient Instructions (Addendum)
Have xray and labs at [facility] today.  Once I see results we can discuss other med changes for diabetes. . Increase torsemide to 3 pills twice per day for now, then back to 2 pills when swelling improves. If any chest pain, worsening shortness of breath, vomiting or worse nausea, or other worsening symptoms proceed to emergency room.  Return to the clinic or go to the nearest emergency room if any of your symptoms worsen or new symptoms occur.  [facility] Walk in 8:30-4:30 during weekdays, no appointment needed [place].  [place], [place] [address]

## 2021-12-10 NOTE — Telephone Encounter (Signed)
Critical lab call came in  BUN = 110 Creatinine = 4.85 GFR = 11.15

## 2021-12-10 NOTE — Telephone Encounter (Signed)
Prior note should read will coordinate with that office "tomorrow"

## 2021-12-11 LAB — PRO B NATRIURETIC PEPTIDE: NT-Pro BNP: 12645 pg/mL — ABNORMAL HIGH (ref 0–376)

## 2021-12-12 ENCOUNTER — Encounter: Payer: Self-pay | Admitting: Family Medicine

## 2021-12-13 ENCOUNTER — Inpatient Hospital Stay (HOSPITAL_BASED_OUTPATIENT_CLINIC_OR_DEPARTMENT_OTHER)
Admission: EM | Admit: 2021-12-13 | Discharge: 2021-12-31 | DRG: 291 | Disposition: A | Discharge status: Home Health | Payer: Medicare Other | Attending: Family Medicine | Admitting: Family Medicine

## 2021-12-13 ENCOUNTER — Emergency Department (HOSPITAL_BASED_OUTPATIENT_CLINIC_OR_DEPARTMENT_OTHER): Payer: Medicare Other

## 2021-12-13 ENCOUNTER — Encounter (HOSPITAL_BASED_OUTPATIENT_CLINIC_OR_DEPARTMENT_OTHER): Payer: Self-pay | Admitting: Emergency Medicine

## 2021-12-13 ENCOUNTER — Other Ambulatory Visit: Payer: Self-pay

## 2021-12-13 DIAGNOSIS — Z7189 Other specified counseling: Secondary | ICD-10-CM

## 2021-12-13 DIAGNOSIS — J811 Chronic pulmonary edema: Secondary | ICD-10-CM | POA: Diagnosis not present

## 2021-12-13 DIAGNOSIS — N185 Chronic kidney disease, stage 5: Secondary | ICD-10-CM | POA: Diagnosis not present

## 2021-12-13 DIAGNOSIS — E876 Hypokalemia: Secondary | ICD-10-CM | POA: Diagnosis not present

## 2021-12-13 DIAGNOSIS — E871 Hypo-osmolality and hyponatremia: Secondary | ICD-10-CM | POA: Diagnosis not present

## 2021-12-13 DIAGNOSIS — I5033 Acute on chronic diastolic (congestive) heart failure: Secondary | ICD-10-CM | POA: Diagnosis not present

## 2021-12-13 DIAGNOSIS — I5032 Chronic diastolic (congestive) heart failure: Secondary | ICD-10-CM | POA: Diagnosis not present

## 2021-12-13 DIAGNOSIS — Z515 Encounter for palliative care: Secondary | ICD-10-CM | POA: Diagnosis not present

## 2021-12-13 DIAGNOSIS — E1165 Type 2 diabetes mellitus with hyperglycemia: Secondary | ICD-10-CM | POA: Diagnosis not present

## 2021-12-13 DIAGNOSIS — I10 Essential (primary) hypertension: Secondary | ICD-10-CM | POA: Diagnosis not present

## 2021-12-13 DIAGNOSIS — J90 Pleural effusion, not elsewhere classified: Secondary | ICD-10-CM | POA: Diagnosis not present

## 2021-12-13 DIAGNOSIS — R627 Adult failure to thrive: Secondary | ICD-10-CM | POA: Diagnosis present

## 2021-12-13 DIAGNOSIS — I2510 Atherosclerotic heart disease of native coronary artery without angina pectoris: Secondary | ICD-10-CM | POA: Diagnosis present

## 2021-12-13 DIAGNOSIS — I132 Hypertensive heart and chronic kidney disease with heart failure and with stage 5 chronic kidney disease, or end stage renal disease: Secondary | ICD-10-CM | POA: Diagnosis not present

## 2021-12-13 DIAGNOSIS — N179 Acute kidney failure, unspecified: Secondary | ICD-10-CM | POA: Diagnosis present

## 2021-12-13 DIAGNOSIS — I130 Hypertensive heart and chronic kidney disease with heart failure and stage 1 through stage 4 chronic kidney disease, or unspecified chronic kidney disease: Secondary | ICD-10-CM | POA: Diagnosis not present

## 2021-12-13 DIAGNOSIS — Z7901 Long term (current) use of anticoagulants: Secondary | ICD-10-CM | POA: Diagnosis not present

## 2021-12-13 DIAGNOSIS — R0602 Shortness of breath: Secondary | ICD-10-CM | POA: Diagnosis not present

## 2021-12-13 DIAGNOSIS — Z7984 Long term (current) use of oral hypoglycemic drugs: Secondary | ICD-10-CM | POA: Diagnosis not present

## 2021-12-13 DIAGNOSIS — E440 Moderate protein-calorie malnutrition: Secondary | ICD-10-CM | POA: Diagnosis present

## 2021-12-13 DIAGNOSIS — D447 Neoplasm of uncertain behavior of aortic body and other paraganglia: Secondary | ICD-10-CM

## 2021-12-13 DIAGNOSIS — I5031 Acute diastolic (congestive) heart failure: Secondary | ICD-10-CM | POA: Diagnosis not present

## 2021-12-13 DIAGNOSIS — I340 Nonrheumatic mitral (valve) insufficiency: Secondary | ICD-10-CM | POA: Diagnosis not present

## 2021-12-13 DIAGNOSIS — J9611 Chronic respiratory failure with hypoxia: Secondary | ICD-10-CM | POA: Diagnosis not present

## 2021-12-13 DIAGNOSIS — N19 Unspecified kidney failure: Secondary | ICD-10-CM

## 2021-12-13 DIAGNOSIS — Z87891 Personal history of nicotine dependence: Secondary | ICD-10-CM

## 2021-12-13 DIAGNOSIS — R54 Age-related physical debility: Secondary | ICD-10-CM | POA: Diagnosis present

## 2021-12-13 DIAGNOSIS — E119 Type 2 diabetes mellitus without complications: Secondary | ICD-10-CM | POA: Diagnosis not present

## 2021-12-13 DIAGNOSIS — R339 Retention of urine, unspecified: Secondary | ICD-10-CM | POA: Diagnosis not present

## 2021-12-13 DIAGNOSIS — N189 Chronic kidney disease, unspecified: Secondary | ICD-10-CM

## 2021-12-13 DIAGNOSIS — Z83438 Family history of other disorder of lipoprotein metabolism and other lipidemia: Secondary | ICD-10-CM

## 2021-12-13 DIAGNOSIS — Z841 Family history of disorders of kidney and ureter: Secondary | ICD-10-CM

## 2021-12-13 DIAGNOSIS — I5043 Acute on chronic combined systolic (congestive) and diastolic (congestive) heart failure: Secondary | ICD-10-CM | POA: Diagnosis present

## 2021-12-13 DIAGNOSIS — I509 Heart failure, unspecified: Secondary | ICD-10-CM | POA: Diagnosis not present

## 2021-12-13 DIAGNOSIS — Z681 Body mass index (BMI) 19 or less, adult: Secondary | ICD-10-CM | POA: Diagnosis not present

## 2021-12-13 DIAGNOSIS — I4892 Unspecified atrial flutter: Secondary | ICD-10-CM | POA: Diagnosis not present

## 2021-12-13 DIAGNOSIS — D631 Anemia in chronic kidney disease: Secondary | ICD-10-CM | POA: Diagnosis not present

## 2021-12-13 DIAGNOSIS — I480 Paroxysmal atrial fibrillation: Secondary | ICD-10-CM | POA: Diagnosis not present

## 2021-12-13 DIAGNOSIS — N401 Enlarged prostate with lower urinary tract symptoms: Secondary | ICD-10-CM | POA: Diagnosis present

## 2021-12-13 DIAGNOSIS — N250 Renal osteodystrophy: Secondary | ICD-10-CM | POA: Diagnosis not present

## 2021-12-13 DIAGNOSIS — D509 Iron deficiency anemia, unspecified: Secondary | ICD-10-CM | POA: Diagnosis present

## 2021-12-13 DIAGNOSIS — E11649 Type 2 diabetes mellitus with hypoglycemia without coma: Secondary | ICD-10-CM | POA: Diagnosis not present

## 2021-12-13 DIAGNOSIS — D649 Anemia, unspecified: Secondary | ICD-10-CM | POA: Diagnosis not present

## 2021-12-13 DIAGNOSIS — K219 Gastro-esophageal reflux disease without esophagitis: Secondary | ICD-10-CM | POA: Diagnosis present

## 2021-12-13 DIAGNOSIS — I700 Atherosclerosis of aorta: Secondary | ICD-10-CM | POA: Diagnosis not present

## 2021-12-13 DIAGNOSIS — N184 Chronic kidney disease, stage 4 (severe): Secondary | ICD-10-CM | POA: Diagnosis not present

## 2021-12-13 DIAGNOSIS — Z91014 Allergy to mammalian meats: Secondary | ICD-10-CM

## 2021-12-13 DIAGNOSIS — R310 Gross hematuria: Secondary | ICD-10-CM | POA: Diagnosis not present

## 2021-12-13 DIAGNOSIS — N3289 Other specified disorders of bladder: Secondary | ICD-10-CM | POA: Diagnosis not present

## 2021-12-13 DIAGNOSIS — R338 Other retention of urine: Secondary | ICD-10-CM | POA: Diagnosis present

## 2021-12-13 DIAGNOSIS — F32A Depression, unspecified: Secondary | ICD-10-CM | POA: Diagnosis not present

## 2021-12-13 DIAGNOSIS — R64 Cachexia: Secondary | ICD-10-CM | POA: Diagnosis present

## 2021-12-13 DIAGNOSIS — Z79899 Other long term (current) drug therapy: Secondary | ICD-10-CM

## 2021-12-13 DIAGNOSIS — E872 Acidosis, unspecified: Secondary | ICD-10-CM | POA: Diagnosis present

## 2021-12-13 DIAGNOSIS — K5900 Constipation, unspecified: Secondary | ICD-10-CM | POA: Diagnosis not present

## 2021-12-13 DIAGNOSIS — D72829 Elevated white blood cell count, unspecified: Secondary | ICD-10-CM | POA: Diagnosis not present

## 2021-12-13 DIAGNOSIS — Z66 Do not resuscitate: Secondary | ICD-10-CM | POA: Diagnosis not present

## 2021-12-13 DIAGNOSIS — R300 Dysuria: Secondary | ICD-10-CM | POA: Diagnosis present

## 2021-12-13 DIAGNOSIS — Z8249 Family history of ischemic heart disease and other diseases of the circulatory system: Secondary | ICD-10-CM

## 2021-12-13 DIAGNOSIS — I1310 Hypertensive heart and chronic kidney disease without heart failure, with stage 1 through stage 4 chronic kidney disease, or unspecified chronic kidney disease: Secondary | ICD-10-CM | POA: Diagnosis present

## 2021-12-13 DIAGNOSIS — E1122 Type 2 diabetes mellitus with diabetic chronic kidney disease: Secondary | ICD-10-CM

## 2021-12-13 DIAGNOSIS — N289 Disorder of kidney and ureter, unspecified: Secondary | ICD-10-CM | POA: Diagnosis not present

## 2021-12-13 DIAGNOSIS — E785 Hyperlipidemia, unspecified: Secondary | ICD-10-CM | POA: Diagnosis present

## 2021-12-13 LAB — URINALYSIS, ROUTINE W REFLEX MICROSCOPIC
Bilirubin Urine: NEGATIVE
Bilirubin Urine: NEGATIVE
Glucose, UA: 100 mg/dL — AB
Glucose, UA: 50 mg/dL — AB
Ketones, ur: NEGATIVE mg/dL
Ketones, ur: NEGATIVE mg/dL
Leukocytes,Ua: NEGATIVE
Leukocytes,Ua: NEGATIVE
Nitrite: NEGATIVE
Nitrite: NEGATIVE
Protein, ur: 100 mg/dL — AB
Protein, ur: 30 mg/dL — AB
Specific Gravity, Urine: 1.01 (ref 1.005–1.030)
Specific Gravity, Urine: 1.008 (ref 1.005–1.030)
pH: 5 (ref 5.0–8.0)
pH: 5 (ref 5.0–8.0)

## 2021-12-13 LAB — CBC WITH DIFFERENTIAL/PLATELET
Abs Immature Granulocytes: 0.05 10*3/uL (ref 0.00–0.07)
Basophils Absolute: 0.1 10*3/uL (ref 0.0–0.1)
Basophils Relative: 1 %
Eosinophils Absolute: 0.1 10*3/uL (ref 0.0–0.5)
Eosinophils Relative: 1 %
HCT: 29.5 % — ABNORMAL LOW (ref 39.0–52.0)
Hemoglobin: 9.6 g/dL — ABNORMAL LOW (ref 13.0–17.0)
Immature Granulocytes: 0 %
Lymphocytes Relative: 3 %
Lymphs Abs: 0.4 10*3/uL — ABNORMAL LOW (ref 0.7–4.0)
MCH: 27.5 pg (ref 26.0–34.0)
MCHC: 32.5 g/dL (ref 30.0–36.0)
MCV: 84.5 fL (ref 80.0–100.0)
Monocytes Absolute: 0.5 10*3/uL (ref 0.1–1.0)
Monocytes Relative: 4 %
Neutro Abs: 11.3 10*3/uL — ABNORMAL HIGH (ref 1.7–7.7)
Neutrophils Relative %: 91 %
Platelets: 386 10*3/uL (ref 150–400)
RBC: 3.49 MIL/uL — ABNORMAL LOW (ref 4.22–5.81)
RDW: 18.7 % — ABNORMAL HIGH (ref 11.5–15.5)
WBC: 12.4 10*3/uL — ABNORMAL HIGH (ref 4.0–10.5)
nRBC: 0 % (ref 0.0–0.2)

## 2021-12-13 LAB — BASIC METABOLIC PANEL
Anion gap: 16 — ABNORMAL HIGH (ref 5–15)
BUN: 106 mg/dL — ABNORMAL HIGH (ref 8–23)
CO2: 21 mmol/L — ABNORMAL LOW (ref 22–32)
Calcium: 8.6 mg/dL — ABNORMAL LOW (ref 8.9–10.3)
Chloride: 100 mmol/L (ref 98–111)
Creatinine, Ser: 4.75 mg/dL — ABNORMAL HIGH (ref 0.61–1.24)
GFR, Estimated: 12 mL/min — ABNORMAL LOW (ref >60)
Glucose, Bld: 290 mg/dL — ABNORMAL HIGH (ref 70–99)
Potassium: 3.7 mmol/L (ref 3.5–5.1)
Sodium: 137 mmol/L (ref 135–145)

## 2021-12-13 LAB — URINALYSIS, MICROSCOPIC (REFLEX)

## 2021-12-13 LAB — BRAIN NATRIURETIC PEPTIDE: B Natriuretic Peptide: 2005 pg/mL — ABNORMAL HIGH (ref 0.0–100.0)

## 2021-12-13 LAB — URIC ACID: Uric Acid, Serum: 7.3 mg/dL (ref 3.7–8.6)

## 2021-12-13 LAB — MAGNESIUM: Magnesium: 2.1 mg/dL (ref 1.7–2.4)

## 2021-12-13 LAB — CK: Total CK: 93 U/L (ref 49–397)

## 2021-12-13 MED ORDER — LINAGLIPTIN 5 MG PO TABS
5.0000 mg | ORAL_TABLET | Freq: Every day | ORAL | Status: DC
  Administered 2021-12-14 – 2021-12-31 (×18): 5 mg via ORAL
  Filled 2021-12-13 (×18): qty 1

## 2021-12-13 MED ORDER — SODIUM BICARBONATE 650 MG PO TABS
650.0000 mg | ORAL_TABLET | Freq: Two times a day (BID) | ORAL | Status: DC
Start: 2021-12-13 — End: 2021-12-29
  Administered 2021-12-13 – 2021-12-29 (×32): 650 mg via ORAL
  Filled 2021-12-13 (×32): qty 1

## 2021-12-13 MED ORDER — POLYETHYLENE GLYCOL 3350 17 G PO PACK: 17.0000 g | PACK | Freq: Every day | ORAL | Status: DC | PRN

## 2021-12-13 MED ORDER — CALCIUM ACETATE (PHOS BINDER) 667 MG PO CAPS
667.0000 mg | ORAL_CAPSULE | Freq: Three times a day (TID) | ORAL | Status: DC
  Administered 2021-12-13 – 2021-12-31 (×32): 667 mg via ORAL
  Filled 2021-12-13 (×40): qty 1

## 2021-12-13 MED ORDER — ACETAMINOPHEN 500 MG PO TABS
1000.0000 mg | ORAL_TABLET | Freq: Once | ORAL | Status: AC
  Administered 2021-12-13: 1000 mg via ORAL
  Filled 2021-12-13: qty 2

## 2021-12-13 MED ORDER — ACETAMINOPHEN 325 MG PO TABS
650.0000 mg | ORAL_TABLET | ORAL | Status: DC | PRN
  Administered 2021-12-14 – 2021-12-17 (×2): 650 mg via ORAL
  Filled 2021-12-13 (×3): qty 2

## 2021-12-13 MED ORDER — TAMSULOSIN HCL 0.4 MG PO CAPS
0.4000 mg | ORAL_CAPSULE | Freq: Every day | ORAL | Status: DC
Start: 2021-12-13 — End: 2021-12-15
  Administered 2021-12-13 – 2021-12-14 (×2): 0.4 mg via ORAL
  Filled 2021-12-13 (×2): qty 1

## 2021-12-13 MED ORDER — ATORVASTATIN CALCIUM 80 MG PO TABS
80.0000 mg | ORAL_TABLET | Freq: Every day | ORAL | Status: DC
  Administered 2021-12-14 – 2021-12-31 (×18): 80 mg via ORAL
  Filled 2021-12-13 (×18): qty 1

## 2021-12-13 MED ORDER — SODIUM CHLORIDE 0.9 % IV SOLN
1.0000 g | INTRAVENOUS | Status: DC
Start: 2021-12-13 — End: 2021-12-13

## 2021-12-13 MED ORDER — SODIUM CHLORIDE 0.9% FLUSH
3.0000 mL | Freq: Two times a day (BID) | INTRAVENOUS | Status: DC
  Administered 2021-12-13 – 2021-12-31 (×30): 3 mL via INTRAVENOUS

## 2021-12-13 MED ORDER — SODIUM CHLORIDE 0.9 % IV SOLN
250.0000 mL | INTRAVENOUS | Status: DC | PRN
Start: 2021-12-13 — End: 2021-12-31

## 2021-12-13 MED ORDER — CITALOPRAM HYDROBROMIDE 20 MG PO TABS
20.0000 mg | ORAL_TABLET | Freq: Every day | ORAL | Status: DC
  Administered 2021-12-14 – 2021-12-31 (×18): 20 mg via ORAL
  Filled 2021-12-13 (×18): qty 1

## 2021-12-13 MED ORDER — APIXABAN 2.5 MG PO TABS
2.5000 mg | ORAL_TABLET | Freq: Two times a day (BID) | ORAL | Status: DC
  Administered 2021-12-13 – 2021-12-31 (×36): 2.5 mg via ORAL
  Filled 2021-12-13 (×36): qty 1

## 2021-12-13 MED ORDER — AMIODARONE HCL 200 MG PO TABS
200.0000 mg | ORAL_TABLET | Freq: Every day | ORAL | Status: DC
  Administered 2021-12-14 – 2021-12-31 (×18): 200 mg via ORAL
  Filled 2021-12-13 (×19): qty 1

## 2021-12-13 MED ORDER — PHENAZOPYRIDINE HCL 100 MG PO TABS: 100.0000 mg | ORAL_TABLET | Freq: Three times a day (TID) | ORAL | Status: DC

## 2021-12-13 MED ORDER — SODIUM CHLORIDE 0.9% FLUSH
3.0000 mL | INTRAVENOUS | Status: DC | PRN
Start: 2021-12-13 — End: 2021-12-31

## 2021-12-13 MED ORDER — DILTIAZEM HCL ER COATED BEADS 180 MG PO CP24
180.0000 mg | ORAL_CAPSULE | Freq: Every day | ORAL | Status: DC
  Administered 2021-12-14 – 2021-12-31 (×18): 180 mg via ORAL
  Filled 2021-12-13 (×18): qty 1

## 2021-12-13 MED ORDER — HYDRALAZINE HCL 25 MG PO TABS
25.0000 mg | ORAL_TABLET | Freq: Four times a day (QID) | ORAL | Status: DC | PRN
  Filled 2021-12-13: qty 1

## 2021-12-13 MED ORDER — INSULIN ASPART 100 UNIT/ML IJ SOLN
0.0000 [IU] | Freq: Three times a day (TID) | INTRAMUSCULAR | Status: DC
  Administered 2021-12-14: 5 [IU] via SUBCUTANEOUS
  Administered 2021-12-14: 2 [IU] via SUBCUTANEOUS

## 2021-12-13 MED ORDER — ONDANSETRON HCL 4 MG/2ML IJ SOLN
4.0000 mg | Freq: Four times a day (QID) | INTRAMUSCULAR | Status: DC | PRN
  Administered 2021-12-20 – 2021-12-23 (×3): 4 mg via INTRAVENOUS
  Filled 2021-12-13 (×4): qty 2

## 2021-12-13 MED ORDER — FINASTERIDE 5 MG PO TABS
5.0000 mg | ORAL_TABLET | Freq: Every day | ORAL | Status: DC
  Administered 2021-12-14 – 2021-12-31 (×18): 5 mg via ORAL
  Filled 2021-12-13 (×18): qty 1

## 2021-12-13 MED ORDER — FENTANYL CITRATE PF 50 MCG/ML IJ SOSY
50.0000 ug | PREFILLED_SYRINGE | Freq: Once | INTRAMUSCULAR | Status: AC
  Administered 2021-12-13: 50 ug via INTRAVENOUS
  Filled 2021-12-13: qty 1

## 2021-12-13 NOTE — ED Triage Notes (Signed)
Per wife has been having increase swelling to feet and SOB, with burning when he urinating.

## 2021-12-13 NOTE — ED Notes (Signed)
ED Provider at bedside. 

## 2021-12-13 NOTE — H&P (Addendum)
History and Physical    Noel Rodier VIJ:[id number] DOB: 02-14-1946 DOA: 12/13/2021  PCP: Shade Flood, MD (Confirm with patient/family/NH records and if not entered, this has to be entered at [facility] point of entry) Patient coming from: Home  I have personally briefly reviewed patient's old medical records in [facility]  Chief Complaint: difficulty urinate, leg swelling  HPI: Brazen Domangue is a 76 y.o. male with medical history significant of chronic diastolic CHF, BPH, CKD stage IV, IIDM, HTN, PAF on Eliquis, secondary hyperparathyroidism, moderate protein calorie malnutrition, recently diagnosed retroperitoneal mass, presented with difficulty urinating, abdominal distention, leg swelling.  Symptoms started 4 to 5 days ago and initially with trouble to urinate, with weak stream and feeling of incomplete emptying, no fever or chills no nauseous vomiting.  Done he noticed that he started to have increasing of ankle swelling bilaterally.  Cardiology increased 6 diuresis however, no significant change of symptoms and his has gained 3 pounds over the last 4 days.  ED Course: Blood pressure significant elevated.  Creatinine 4.7 compared to baseline 3.8, bicarb 21, K3.7. CT abdomen showed enlarged retroperitoneal mass.  Bedside bladder scan showed more than 700 mL urine and Foley was placed in.  Review of Systems: As per HPI otherwise 14 point review of systems negative.    Past Medical History:  Diagnosis Date   Anemia    CHF (congestive heart failure) (HCC)    Chronic kidney disease    Depression    Diabetes mellitus without complication (HCC)    Type II   GERD (gastroesophageal reflux disease)    History of blood transfusion    Hyperlipidemia    Hypertension    Thyroid disease    was on supplement, taken off by Dr Perrin Maltese    Past Surgical History:  Procedure Laterality Date   APPENDECTOMY     AV FISTULA PLACEMENT Right 09/04/2021   Procedure: RIGHT BRACHIOCEPHALIC  ARTERIOVENOUS (AV) FISTULA CREATION;  Surgeon: Cephus Shelling, MD;  Location: [facility] OR;  Service: Vascular;  Laterality: Right;   BIOPSY  12/25/2020   Procedure: BIOPSY;  Surgeon: Lemar Lofty., MD;  Location: [facility] ENDOSCOPY;  Service: Gastroenterology;;   CARDIAC CATHETERIZATION N/A 01/30/2016   Procedure: Left Heart Cath and Coronary Angiography;  Surgeon: Corky Crafts, MD;  Location: [facility] INVASIVE CV LAB;  Service: Cardiovascular;  Laterality: N/A;   ENTEROSCOPY N/A 12/25/2020   Procedure: ENTEROSCOPY;  Surgeon: Meridee Score Netty Starring., MD;  Location: [facility] ENDOSCOPY;  Service: Gastroenterology;  Laterality: N/A;   GIVENS CAPSULE STUDY N/A 12/25/2020   Procedure: GIVENS CAPSULE STUDY;  Surgeon: Lemar Lofty., MD;  Location: [facility] ENDOSCOPY;  Service: Gastroenterology;  Laterality: N/A;   SUBMUCOSAL TATTOO INJECTION  12/25/2020   Procedure: SUBMUCOSAL TATTOO INJECTION;  Surgeon: Meridee Score Netty Starring., MD;  Location: [facility] ENDOSCOPY;  Service: Gastroenterology;;     reports that he quit smoking about 2 years ago. His smoking use included cigarettes. He has a 4.80 pack-year smoking history. He has never used smokeless tobacco. He reports that he does not drink alcohol and does not use drugs.  Allergies  Allergen Reactions   Pork-Derived Products Other (See Comments)    Patient "just does not eat this"    Family History  Problem Relation Age of Onset   Hypertension Mother    Hyperlipidemia Mother    Hypertension Sister    Kidney disease Father    Heart disease Brother 7       open heart surgery  Colon cancer Neg Hx      Prior to Admission medications   Medication Sig Start Date End Date Taking? Authorizing Provider  amiodarone (PACERONE) 200 MG tablet Take 1 tablet (200 mg total) by mouth daily. Follow up appt required for further refills. Please call [phone] to schedule an appt 11/30/21   Darrick Grinder D, NP  atorvastatin (LIPITOR) 80 MG tablet TAKE 1 TABLET(80  MG) BY MOUTH DAILY 12/22/20   [name], MD  blood glucose meter kit and supplies Use up to two times daily as directed  ICD10 E10.9 E11.9 02/20/19   [name], MD  CALPHRON 667 MG tablet Take 667 mg by mouth 3 (three) times daily. 11/09/21   [provider]  Cholecalciferol (VITAMIN D3 PO) Take 1 tablet by mouth daily.    [provider]  citalopram (CELEXA) 20 MG tablet TAKE 1 TABLET(20 MG) BY MOUTH DAILY 11/16/21   [name], MD  diltiazem (CARDIZEM CD) 180 MG 24 hr capsule TAKE 1 CAPSULE(180 MG) BY MOUTH DAILY 08/24/21   Bensimhon, Shaune Pascal, MD  ELIQUIS 2.5 MG TABS tablet TAKE 1 TABLET(2.5 MG) BY MOUTH TWICE DAILY 10/30/21   Bensimhon, Shaune Pascal, MD  glipiZIDE (GLUCOTROL XL) 2.5 MG 24 hr tablet Take 1 tablet (2.5 mg total) by mouth daily with breakfast. With $RemoveBeforeD'5mg'taBOLgGbLjCKnv$  for total dose of 7.$RemoveB'5mg'URfVuPfY$  qd. 11/26/21   [name], MD  glipiZIDE (GLUCOTROL XL) 5 MG 24 hr tablet Take 1 tablet (5 mg total) by mouth daily with breakfast. 11/22/21   Domenic Polite, MD  GlucoCom Lancets MISC Use for home glucose monitoring 09/02/15   Harrison Mons, PA  glucose blood (ONETOUCH VERIO) test strip Test up to 2 times per day.  Uncontrolled diabetes with hyperglycemia and stage 4 CKD. 04/13/19   [name], MD  polyethylene glycol (MIRALAX / GLYCOLAX) 17 g packet Take 17 g by mouth daily as needed for mild constipation or moderate constipation (MIX AND DRINK).    [provider]  sitaGLIPtin (JANUVIA) 25 MG tablet TAKE 1 TABLET(25 MG) BY MOUTH DAILY 08/31/21   [name], MD  sodium bicarbonate 650 MG tablet Take 650 mg by mouth 2 (two) times daily.    [provider]  tamsulosin (FLOMAX) 0.4 MG CAPS capsule Take 0.4 mg by mouth daily.    [provider]  torsemide (DEMADEX) 20 MG tablet Take 2 tablets (40 mg total) by mouth 2 (two) times daily. 10/05/21   Milford, Maricela Bo, FNP  vitamin B-12 (CYANOCOBALAMIN) 500 MCG tablet Take 500 mcg by mouth  daily.    [provider]    Physical Exam: Vitals:   12/13/21 1330 12/13/21 1500 12/13/21 1530 12/13/21 1646  BP: 119/66 126/76 (!) 123/58 (!) 153/69  Pulse: 61 62 (!) 53 (!) 59  Resp: 11 (!) $Remo'24 12 16  'ddaEH$ Temp:      TempSrc:      SpO2: 99% 95% 98% 98%  Weight:    52.7 kg  Height:    '5\' 6"'$  (1.676 m)    Constitutional: NAD, calm, comfortable Vitals:   12/13/21 1330 12/13/21 1500 12/13/21 1530 12/13/21 1646  BP: 119/66 126/76 (!) 123/58 (!) 153/69  Pulse: 61 62 (!) 53 (!) 59  Resp: 11 (!) $Remo'24 12 16  'XrQBB$ Temp:      TempSrc:      SpO2: 99% 95% 98% 98%  Weight:    52.7 kg  Height:    '5\' 6"'$  (1.676 m)  Eyes: PERRL, lids and conjunctivae normal ENMT: Mucous membranes are moist. Posterior pharynx clear of any exudate or lesions.Normal dentition.  Neck: normal, supple, no masses, no thyromegaly Respiratory: clear to auscultation bilaterally, no wheezing, no crackles. Normal respiratory effort. No accessory muscle use.  Cardiovascular: Regular rate and rhythm, systolic murmur on apex . 2+ extremity edema. 2+ pedal pulses. No carotid bruits.  Abdomen: no tenderness, no masses palpated. No hepatosplenomegaly. Bowel sounds positive.  Musculoskeletal: no clubbing / cyanosis. No joint deformity upper and lower extremities. Good ROM, no contractures. Normal muscle tone.  Skin: no rashes, lesions, ulcers. No induration Neurologic: CN 2-12 grossly intact. Sensation intact, DTR normal. Strength 5/5 in all 4.  Psychiatric: Normal judgment and insight. Alert and oriented x 3. Normal mood.     Labs on Admission: I have personally reviewed following labs and imaging studies  CBC: Recent Labs  Lab 12/13/21 1011  WBC 12.4*  NEUTROABS 11.3*  HGB 9.6*  HCT 29.5*  MCV 84.5  PLT 546   Basic Metabolic Panel: Recent Labs  Lab 12/10/21 1339 12/13/21 1011  NA 141 137  K 3.7 3.7  CL 102 100  CO2 21 21*  GLUCOSE 98 290*  BUN 110* 106*  CREATININE 4.85* 4.75*  CALCIUM 8.8 8.6*  MG  --   2.1   GFR: Estimated Creatinine Clearance: 10.2 mL/min (A) (by C-G formula based on SCr of 4.75 mg/dL (H)). Liver Function Tests: No results for input(s): AST, ALT, ALKPHOS, BILITOT, PROT, ALBUMIN in the last 168 hours. No results for input(s): LIPASE, AMYLASE in the last 168 hours. No results for input(s): AMMONIA in the last 168 hours. Coagulation Profile: No results for input(s): INR, PROTIME in the last 168 hours. Cardiac Enzymes: No results for input(s): CKTOTAL, CKMB, CKMBINDEX, TROPONINI in the last 168 hours. BNP (last 3 results) Recent Labs    12/10/21 1339  PROBNP 12,645*   HbA1C: No results for input(s): HGBA1C in the last 72 hours. CBG: No results for input(s): GLUCAP in the last 168 hours. Lipid Profile: No results for input(s): CHOL, HDL, LDLCALC, TRIG, CHOLHDL, LDLDIRECT in the last 72 hours. Thyroid Function Tests: No results for input(s): TSH, T4TOTAL, FREET4, T3FREE, THYROIDAB in the last 72 hours. Anemia Panel: No results for input(s): VITAMINB12, FOLATE, FERRITIN, TIBC, IRON, RETICCTPCT in the last 72 hours. Urine analysis:    Component Value Date/Time   COLORURINE STRAW (A) 12/13/2021 1026   APPEARANCEUR CLEAR 12/13/2021 1026   LABSPEC 1.010 12/13/2021 1026   PHURINE 5.0 12/13/2021 1026   GLUCOSEU 100 (A) 12/13/2021 1026   HGBUR SMALL (A) 12/13/2021 1026   BILIRUBINUR NEGATIVE 12/13/2021 1026   BILIRUBINUR negative 03/02/2021 1458   BILIRUBINUR neg 03/23/2015 1404   KETONESUR NEGATIVE 12/13/2021 1026   PROTEINUR 100 (A) 12/13/2021 1026   UROBILINOGEN 0.2 03/02/2021 1458   NITRITE NEGATIVE 12/13/2021 1026   LEUKOCYTESUR NEGATIVE 12/13/2021 1026    Radiological Exams on Admission: CT ABDOMEN PELVIS WO CONTRAST  Result Date: 12/13/2021 CLINICAL DATA:  76 year old male with history of abnormal chest x-ray. Pneumonia. Pleural effusion. Suspected malignancy. EXAM: CT CHEST, ABDOMEN AND PELVIS WITHOUT CONTRAST TECHNIQUE: Multidetector CT imaging of the  chest, abdomen and pelvis was performed following the standard protocol without IV contrast. RADIATION DOSE REDUCTION: This exam was performed according to the departmental dose-optimization program which includes automated exposure control, adjustment of the mA and/or kV according to patient size and/or use of iterative reconstruction technique. COMPARISON:  Chest CT 11/15/2021. CT the abdomen and pelvis  11/17/2021. FINDINGS: Comment: Today's study is severely limited for detection and characterization of visceral and/or vascular lesions by lack of IV contrast. CT CHEST FINDINGS Cardiovascular: Heart size is mildly enlarged. There is no significant pericardial fluid, thickening or pericardial calcification. There is aortic atherosclerosis, as well as atherosclerosis of the great vessels of the mediastinum and the coronary arteries, including calcified atherosclerotic plaque in the left main, left anterior descending, left circumflex and right coronary arteries. Mediastinum/Nodes: Multiple prominent borderline enlarged mediastinal lymph nodes are noted, nonspecific, but similar to the recent prior study, favored to be reactive. No definite pathologically enlarged mediastinal or hilar lymph nodes are confidently identified on today's noncontrast CT examination. Esophagus is unremarkable in appearance. No axillary lymphadenopathy. Lungs/Pleura: Moderate right chronic partially loculated pleural effusion and small left chronic partially loculated pleural effusion, both of which are stable compared to the prior study. Both of these are associated with extensive areas of pleuroparenchymal thickening and nodular/mass-like architectural distortion in the lungs bilaterally, with associated pleural tails", most compatible with extensive areas of rounded atelectasis. There continues to be some patchy multifocal ground-glass attenuation in the lungs bilaterally, some of which appears improved compared to the prior study, but  other areas have clearly increased compared to the prior examination, with some associated septal thickening, most evident in the anterior aspect of the right upper lobe (best appreciated on axial image 58 of series 3). Musculoskeletal: There are no aggressive appearing lytic or blastic lesions noted in the visualized portions of the skeleton. CT ABDOMEN PELVIS FINDINGS Hepatobiliary: No definite suspicious appearing hepatic lesions are confidently identified on today's noncontrast CT examination. Unenhanced appearance of the gallbladder is unremarkable. Pancreas: No definite pancreatic mass or peripancreatic fluid collections or inflammatory changes are confidently identified on today's noncontrast CT examination. Spleen: Unremarkable. Adrenals/Urinary Tract: Low-attenuation lesions in the upper pole of the right kidney measuring up to 2.9 x 2.3 cm, incompletely characterized on today's non-contrast CT examination, but statistically likely to represent cysts. Other subcentimeter high attenuation lesions are noted in the left kidney, incompletely characterized, but favored to represent proteinaceous/hemorrhagic cysts. Low-attenuation adreniform thickening bilaterally, favored to represent adenomatous hyperplasia. Large left retroperitoneal mass (discussed below) which is favored to be separate from the left adrenal gland, although this does make contact with the anterior aspect of the left adrenal gland such that an adrenal lesion is not entirely excluded. No hydroureteronephrosis. Urinary bladder is markedly distended, but otherwise unremarkable in appearance on today's noncontrast CT examination. Stomach/Bowel: The appearance of the stomach is normal. No pathologic dilatation of small bowel or colon. The appendix is not confidently identified and may be surgically absent. Regardless, there are no inflammatory changes noted adjacent to the cecum to suggest the presence of an acute appendicitis at this time.  Vascular/Lymphatic: Aortic atherosclerosis. Large left para-aortic retroperitoneal mass could represent malignant centrally necrotic lymphadenopathy, however, this is favored to represent a primary retroperitoneal lesion (see discussion below). No other lymphadenopathy noted elsewhere in the abdomen or pelvis. Reproductive: Prostate gland and seminal vesicles are unremarkable in appearance. Other: Again noted is a large left-sided para-aortic retroperitoneal mass measuring approximately 6.9 x 6.1 x 7.9 cm (axial image 27 of series 2 and coronal image 38 of series 5), previously 6.7 x 5.4 x 6.9 cm). This is intimately associated with the adjacent structures, including the abdominal aorta left renal artery, proximal superior mesenteric artery, anterior aspect of the left adrenal gland, and multiple small bowel loops. The lesion appears separate from the pancreas. The lesion is  heterogeneous in attenuation with some central areas that are lower attenuation (21 HU) and peripheral intermediate attenuation (38 HU). No significant volume of ascites. No pneumoperitoneum. Musculoskeletal: There are no aggressive appearing lytic or blastic lesions noted in the visualized portions of the skeleton. IMPRESSION: 1. 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 This may represent a primary retroperitoneal neoplasm, but differential considerations are very broad given the location, and include malignant lymphadenopathy, vascular lesions, lesions of adrenal origin, gastrointestinal lesion of the small bowel, etc. Further evaluation with abdominal MRI with and without IV gadolinium is recommended to better characterize this finding. 2. Shifting pattern of ground-glass attenuation and septal thickening in the lungs is of uncertain etiology and significance, but is overall slightly worsened and likely  of infectious or inflammatory etiology. Infiltrative neoplasm such as metastatic adenocarcinoma is difficult to entirely exclude. 3. Chronic bilateral pleural effusions with extensive rounded atelectasis in both lungs, similar to the prior study. 4. Mild cardiomegaly. 5. Aortic atherosclerosis, in addition to left main and three-vessel coronary artery disease. Assessment for potential risk factor modification, dietary therapy or pharmacologic therapy may be warranted, if clinically indicated. 6. Additional incidental findings, as above. Electronically Signed   By: Vinnie Langton M.D.   On: 12/13/2021 13:31   CT Chest Wo Contrast  Result Date: 12/13/2021 CLINICAL DATA:  76 year old male with history of abnormal chest x-ray. Pneumonia. Pleural effusion. Suspected malignancy. EXAM: CT CHEST, ABDOMEN AND PELVIS WITHOUT CONTRAST TECHNIQUE: Multidetector CT imaging of the chest, abdomen and pelvis was performed following the standard protocol without IV contrast. RADIATION DOSE REDUCTION: This exam was performed according to the departmental dose-optimization program which includes automated exposure control, adjustment of the mA and/or kV according to patient size and/or use of iterative reconstruction technique. COMPARISON:  Chest CT 11/15/2021. CT the abdomen and pelvis 11/17/2021. FINDINGS: Comment: Today's study is severely limited for detection and characterization of visceral and/or vascular lesions by lack of IV contrast. CT CHEST FINDINGS Cardiovascular: Heart size is mildly enlarged. There is no significant pericardial fluid, thickening or pericardial calcification. There is aortic atherosclerosis, as well as atherosclerosis of the great vessels of the mediastinum and the coronary arteries, including calcified atherosclerotic plaque in the left main, left anterior descending, left circumflex and right coronary arteries. Mediastinum/Nodes: Multiple prominent borderline enlarged mediastinal lymph nodes are  noted, nonspecific, but similar to the recent prior study, favored to be reactive. No definite pathologically enlarged mediastinal or hilar lymph nodes are confidently identified on today's noncontrast CT examination. Esophagus is unremarkable in appearance. No axillary lymphadenopathy. Lungs/Pleura: Moderate right chronic partially loculated pleural effusion and small left chronic partially loculated pleural effusion, both of which are stable compared to the prior study. Both of these are associated with extensive areas of pleuroparenchymal thickening and nodular/mass-like architectural distortion in the lungs bilaterally, with associated pleural tails", most compatible with extensive areas of rounded atelectasis. There continues to be some patchy multifocal ground-glass attenuation in the lungs bilaterally, some of which appears improved compared to the prior study, but other areas have clearly increased compared to the prior examination, with some associated septal thickening, most evident in the anterior aspect of the right upper lobe (best appreciated on axial image 58 of series 3). Musculoskeletal: There are no aggressive appearing lytic or blastic lesions noted in the visualized portions of the skeleton. CT ABDOMEN PELVIS FINDINGS Hepatobiliary: No definite suspicious appearing hepatic  lesions are confidently identified on today's noncontrast CT examination. Unenhanced appearance of the gallbladder is unremarkable. Pancreas: No definite pancreatic mass or peripancreatic fluid collections or inflammatory changes are confidently identified on today's noncontrast CT examination. Spleen: Unremarkable. Adrenals/Urinary Tract: Low-attenuation lesions in the upper pole of the right kidney measuring up to 2.9 x 2.3 cm, incompletely characterized on today's non-contrast CT examination, but statistically likely to represent cysts. Other subcentimeter high attenuation lesions are noted in the left kidney, incompletely  characterized, but favored to represent proteinaceous/hemorrhagic cysts. Low-attenuation adreniform thickening bilaterally, favored to represent adenomatous hyperplasia. Large left retroperitoneal mass (discussed below) which is favored to be separate from the left adrenal gland, although this does make contact with the anterior aspect of the left adrenal gland such that an adrenal lesion is not entirely excluded. No hydroureteronephrosis. Urinary bladder is markedly distended, but otherwise unremarkable in appearance on today's noncontrast CT examination. Stomach/Bowel: The appearance of the stomach is normal. No pathologic dilatation of small bowel or colon. The appendix is not confidently identified and may be surgically absent. Regardless, there are no inflammatory changes noted adjacent to the cecum to suggest the presence of an acute appendicitis at this time. Vascular/Lymphatic: Aortic atherosclerosis. Large left para-aortic retroperitoneal mass could represent malignant centrally necrotic lymphadenopathy, however, this is favored to represent a primary retroperitoneal lesion (see discussion below). No other lymphadenopathy noted elsewhere in the abdomen or pelvis. Reproductive: Prostate gland and seminal vesicles are unremarkable in appearance. Other: Again noted is a large left-sided para-aortic retroperitoneal mass measuring approximately 6.9 x 6.1 x 7.9 cm (axial image 27 of series 2 and coronal image 38 of series 5), previously 6.7 x 5.4 x 6.9 cm). This is intimately associated with the adjacent structures, including the abdominal aorta left renal artery, proximal superior mesenteric artery, anterior aspect of the left adrenal gland, and multiple small bowel loops. The lesion appears separate from the pancreas. The lesion is heterogeneous in attenuation with some central areas that are lower attenuation (21 HU) and peripheral intermediate attenuation (38 HU). No significant volume of ascites. No  pneumoperitoneum. Musculoskeletal: There are no aggressive appearing lytic or blastic lesions noted in the visualized portions of the skeleton. IMPRESSION: 1. 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 This may represent a primary retroperitoneal neoplasm, but differential considerations are very broad given the location, and include malignant lymphadenopathy, vascular lesions, lesions of adrenal origin, gastrointestinal lesion of the small bowel, etc. Further evaluation with abdominal MRI with and without IV gadolinium is recommended to better characterize this finding. 2. Shifting pattern of ground-glass attenuation and septal thickening in the lungs is of uncertain etiology and significance, but is overall slightly worsened and likely of infectious or inflammatory etiology. Infiltrative neoplasm such as metastatic adenocarcinoma is difficult to entirely exclude. 3. Chronic bilateral pleural effusions with extensive rounded atelectasis in both lungs, similar to the prior study. 4. Mild cardiomegaly. 5. Aortic atherosclerosis, in addition to left main and three-vessel coronary artery disease. Assessment for potential risk factor modification, dietary therapy or pharmacologic therapy may be warranted, if clinically indicated. 6. Additional incidental findings, as above. Electronically Signed   By: Vinnie Langton M.D.   On: 12/13/2021 13:31   DG Chest Portable 1 View  Result Date: 12/13/2021 CLINICAL DATA:  Shortness of breath.  Renal failure. EXAM: PORTABLE CHEST 1 VIEW COMPARISON:  12/10/2021 radiograph And prior studies FINDINGS: Cardiomediastinal silhouette  is unchanged. Pulmonary vascular congestion again noted. Moderate RIGHT pleural effusion and small to moderate LEFT pleural effusion are again noted. Unchanged bilateral pulmonary opacities/scarring again identified.  Nodular opacity overlying the RIGHT UPPER lung appears slightly more prominent which is likely technical. There is no evidence of pneumothorax or acute bony abnormality. IMPRESSION: Little significant change in appearance of the chest with pulmonary vascular congestion, bilateral pleural effusions and unchanged bilateral pulmonary opacities. Electronically Signed   By: Margarette Canada M.D.   On: 12/13/2021 10:49    EKG: Independently reviewed.  Sinus, LVH, chronic ST changes, prolonged PR and QTc interval.  Assessment/Plan Principal Problem:   Cardiorenal syndrome Active Problems:   Acute CHF (congestive heart failure) (H[facility])   AKI (acute kidney injury) ([facility])  (please populate well all problems here in Problem List. (For example, if patient is on BP meds at home and you resume or decide to hold them, it is a problem that needs to be her. 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  But for now we will add as needed hydralazine. -Cardiology to help [place] dose diuresis -Foley placed in for accurate I&O and acute urinary retention -Measure daily BMP  AKI on CKD stage IV -Symptoms signs of fluid overload likely from uncontrolled hypertension and cardiorenal syndrome likely -CT abdomen pelvis no significant new renal abnormality -Diuresis as per cardiology -Foley for accurate I&O  Acute urinary retention -Likely secondary to uncontrolled BPH -Bladder scan> 700, Foley placed in for urine retention and AKI -Plan to stay Foley in for 3 days then void trial -Add proscar  Leukocytosis -UA sent -Given his urinary symptoms, 1 dose of ceftriaxone given, pending on UA, decide further ABX.  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  IIDM -Hold off glipizide given the surge of kidney function -Continue Januvia -Add sliding scale  HTN -As above  PAF on Eliquis -Rate controlled, continue amiodarone, continue Eliquis  DVT prophylaxis: Eliquis Code Status: Full code Family Communication: None at present Disposition Plan: Expect more than 2 midnight hospital stay, patient is sick with both AKI and fluid overload and CHF decompensation. Consults called: Cardiology Admission status: Tele admit   Lequita Halt MD [facility] Pager [phone]  12/13/2021, 5:53 PM

## 2021-12-13 NOTE — ED Notes (Signed)
Urinal provided to client, explained that urine spec is requested for part of his examination and care

## 2021-12-13 NOTE — Consult Note (Signed)
Cardiology Consultation:   Patient ID: Adam Reid MRN: [id number]; DOB: 22-Dec-1945  Admit date: 12/13/2021 Date of Consult: 12/13/2021  PCP:  Shade Flood, MD   [facility] Providers Cardiologist:  Parke Poisson, MD   {    Patient Profile:   Adam Reid is a 76 y.o. male with a history of mild non-obstructive CAD noted on cardiac catheterization in 2017, chronic diastolic CHF, paroxysmal atrial fibrillation on Eliquis, hypertension, hyperlipidemia, type 2 diabetes mellitus, CKD stage IV, GERD, chronic anemia, and prior tobacco abuse (quit in 2021)  who is being seen for the evaluation of CHF at the request of Dr. Robb Matar.  History of Present Illness:   Adam Reid is a 76 year old male with the above history who is followed by Dr. Gala Reid. Patient has a history of of chronic diastolic CHF with multiple hospitalizations for this in the past. Most recently admitted from 11/12/2021 to 11/22/2021 for acute on chronic CHF and CAP. Echo during admission showed LVEF of 55-60% with grade III diastolic dysfunction. RV normal. He was diuresed with IV Lasix and transitioned to Torsemide. CT during admission also showed a left retroperitoneal mass with central necrosis and this was biopsied by IR. He was seen in the CHF clinic for follow-up on 11/30/2021 at which time he was doing well overall. He reported getting tired after walking but denied any shortness of breath, orthopnea, or PND. Weight was around 115 to 118 lbs at home and he was compliant with all his medications. No medication changes were made.  Patient presented to the [facility] ED today for further evaluation of shortness of breath and edema.  He reported worsening shortness of breath again about 4 days after last hospitalization. He reports significant dyspnea on exertion to the point where is having trouble walking but does not report any significant shortness of breath at rest. No real orthopnea or PND. He reports he  started having recurrent lower extremity edema about 5 days ago. His abdomen is extended on exam but he denies any real abdominal pain. He does report occasional nausea but no early satiety. He denies any chest pain, palpitations, lightheadedness, dizziness. He has some unsteadiness on his feet but denies any falls or syncope. No fevers, cough, nasal congestion. No abnormal bleeding in urine or stools.  Upon arrival to the ED, patient was hypertensive with BP in the 180s/90s and hypoxic with O2 sats in the low 80s. This improved with 4L of O2 via nasal cannula. EKG showed no acute ischemic changes. BNP elevated at 2,005 which is higher than it was during most recent admission. Chest x-ray showed pulmonary vascular congestion, bilateral pleural effusions, and unchanged bilateral pulmonary opacities. CT showed chronic bilateral pleural effusion with extensive rounded atelectasis in both lungs as well as a shifting pattern of ground-glass attenuation and septal thickening in the lungs of uncertain etiology. 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 WBC 12.4, Hgb 9.6, Plts 386. Na 137, K 3.7, Glucose 290, BUN 106, Cr 4.75. Patient was admitted to [facility] and Cardiology were consulted.  At the time of this evaluation, patient's biggest complain is pain around his penis and testicles which started last night. He states the pain is all the time not just with urinating. Bladder scan showed about 650 cc of urine.  Past Medical History:  Diagnosis Date   Anemia    CHF (congestive heart failure) (HCC)    Chronic kidney disease    Depression  Diabetes mellitus without complication (HCC)    Type II   GERD (gastroesophageal reflux disease)    History of blood transfusion    Hyperlipidemia    Hypertension    Thyroid disease    was on supplement, taken off by [name]    Past Surgical History:  Procedure Laterality Date   APPENDECTOMY     AV FISTULA PLACEMENT  Right 09/04/2021   Procedure: RIGHT BRACHIOCEPHALIC ARTERIOVENOUS (AV) FISTULA CREATION;  Surgeon: [name], MD;  Location: [place];  Service: Vascular;  Laterality: Right;   BIOPSY  12/25/2020   Procedure: BIOPSY;  Surgeon: [name]., MD;  Location: [place];  Service: Gastroenterology;;   CARDIAC CATHETERIZATION N/A 01/30/2016   Procedure: Left Heart Cath and Coronary Angiography;  Surgeon: [name], MD;  Location: [place] [facility];  Service: Cardiovascular;  Laterality: N/A;   ENTEROSCOPY N/A 12/25/2020   Procedure: ENTEROSCOPY;  Surgeon: [name], MD;  Location: [place];  Service: Gastroenterology;  Laterality: N/A;   GIVENS CAPSULE STUDY N/A 12/25/2020   Procedure: GIVENS CAPSULE STUDY;  Surgeon: [name]., MD;  Location: [place];  Service: Gastroenterology;  Laterality: N/A;   SUBMUCOSAL TATTOO INJECTION  12/25/2020   Procedure: SUBMUCOSAL TATTOO INJECTION;  Surgeon: [name]., MD;  Location: [place];  Service: Gastroenterology;;     Home Medications:  Prior to Admission medications   Medication Sig Start Date End Date Taking? Authorizing Provider  amiodarone (PACERONE) 200 MG tablet Take 1 tablet (200 mg total) by mouth daily. Follow up appt required for further refills. Please call [phone] to schedule an appt 11/30/21   Darrick Grinder D, NP  atorvastatin (LIPITOR) 80 MG tablet TAKE 1 TABLET(80 MG) BY MOUTH DAILY 12/22/20   [name], MD  blood glucose meter kit and supplies Use up to two times daily as directed  ICD10 E10.9 E11.9 02/20/19   [name], MD  CALPHRON 667 MG tablet Take 667 mg by mouth 3 (three) times daily. 11/09/21   [provider]  Cholecalciferol (VITAMIN D3 PO) Take 1 tablet by mouth daily.    [provider]  citalopram (CELEXA) 20 MG tablet TAKE 1 TABLET(20 MG) BY MOUTH DAILY 11/16/21   [name], MD  diltiazem (CARDIZEM CD) 180 MG 24 hr  capsule TAKE 1 CAPSULE(180 MG) BY MOUTH DAILY 08/24/21   Bensimhon, Shaune Pascal, MD  ELIQUIS 2.5 MG TABS tablet TAKE 1 TABLET(2.5 MG) BY MOUTH TWICE DAILY 10/30/21   Bensimhon, Shaune Pascal, MD  glipiZIDE (GLUCOTROL XL) 2.5 MG 24 hr tablet Take 1 tablet (2.5 mg total) by mouth daily with breakfast. With $RemoveBeforeD'5mg'BmvXUPkpLpURoE$  for total dose of 7.$RemoveB'5mg'YilGIAug$  qd. 11/26/21   [name], MD  glipiZIDE (GLUCOTROL XL) 5 MG 24 hr tablet Take 1 tablet (5 mg total) by mouth daily with breakfast. 11/22/21   Domenic Polite, MD  GlucoCom Lancets MISC Use for home glucose monitoring 09/02/15   Harrison Mons, PA  glucose blood (ONETOUCH VERIO) test strip Test up to 2 times per day.  Uncontrolled diabetes with hyperglycemia and stage 4 CKD. 04/13/19   [name], MD  polyethylene glycol (MIRALAX / GLYCOLAX) 17 g packet Take 17 g by mouth daily as needed for mild constipation or moderate constipation (MIX AND DRINK).    [provider]  sitaGLIPtin (JANUVIA) 25 MG tablet TAKE 1 TABLET(25 MG) BY MOUTH DAILY 08/31/21   [name], MD  sodium bicarbonate 650 MG tablet  Take 650 mg by mouth 2 (two) times daily.    [provider]  tamsulosin (FLOMAX) 0.4 MG CAPS capsule Take 0.4 mg by mouth daily.    [provider]  torsemide (DEMADEX) 20 MG tablet Take 2 tablets (40 mg total) by mouth 2 (two) times daily. 10/05/21   Milford, Maricela Bo, FNP  vitamin B-12 (CYANOCOBALAMIN) 500 MCG tablet Take 500 mcg by mouth daily.    [provider]    Inpatient Medications: Scheduled Meds:  Continuous Infusions:  PRN Meds:   Allergies:    Allergies  Allergen Reactions   Pork-Derived Products Other (See Comments)    Patient "just does not eat this"    Social History:   Social History   Socioeconomic History   Marital status: Married    Spouse name: [name]   Number of children: 1   Years of education: 12th grade   Highest education level: Not on file  Occupational History   Occupation:  Retired-accountant  Tobacco Use   Smoking status: Former    Packs/day: 0.10    Years: 48.00    Pack years: 4.80    Types: Cigarettes    Quit date: 07/16/2019    Years since quitting: 2.4   Smokeless tobacco: Never   Tobacco comments:    09/03/22 Wife states, "he hides from me, so I am not sure how much he smokes."  Vaping Use   Vaping Use: Never used  Substance and Sexual Activity   Alcohol use: No    Alcohol/week: 0.0 standard drinks   Drug use: No   Sexual activity: Never    Partners: Female  Other Topics Concern   Not on file  Social History Narrative   Originally from [place]. Came to the [place] in 2009, following their son who came here for school.   Married.   Lives with his wife.   Their adult son lives in [place], [place], where he is in optometry school.   Education: [employer]   Exercise: No   Social Determinants of Radio broadcast assistant Strain: Not on file  Food Insecurity: Not on file  Transportation Needs: Not on file  Physical Activity: Not on file  Stress: Not on file  Social Connections: Not on file  Intimate Partner Violence: Not on file    Family History:   Family History  Problem Relation Age of Onset   Hypertension Mother    Hyperlipidemia Mother    Hypertension Sister    Kidney disease Father    Heart disease Brother 56       open heart surgery   Colon cancer Neg Hx      ROS:  Please see the history of present illness.  All other ROS reviewed and negative.     Physical Exam/Data:   Vitals:   12/13/21 1330 12/13/21 1500 12/13/21 1530 12/13/21 1646  BP: 119/66 126/76 (!) 123/58 (!) 153/69  Pulse: 61 62 (!) 53 (!) 59  Resp: 11 (!) $Remo'24 12 16  'VzVgt$ Temp:    (!) 97.4 F (36.3 C)  TempSrc:    Oral  SpO2: 99% 95% 98% 98%  Weight:    52.7 kg  Height:    '5\' 6"'$  (1.676 m)    Intake/Output Summary (Last 24 hours) at 12/13/2021 1757 Last data filed at 12/13/2021 1754 Gross per 24 hour  Intake --  Output 900 ml  Net -900 ml      12/13/2021    4:46 PM  12/13/2021  10:04 AM 12/10/2021   11:24 AM  Last 3 Weights  Weight (lbs) 116 lb 2.9 oz 116 lb 10 oz 120 lb 9.6 oz  Weight (kg) 52.7 kg 52.9 kg 54.704 kg     Body mass index is 18.75 kg/m.  General: 76 y.o. thin male resting comfortably in no acute distress. HEENT: Normocephalic and atraumatic. Sclera clear.  Neck: Supple. JVD elevated. Heart: RRR. Distinct S1 and S2. III/VI systolic murmur best heard at the apex.  Radial tal pedal pulses 2+ and equal bilaterally. Lungs: No increased work of breathing. Decreased breath sounds in bases with bibasilar crackles. Abdomen: Lower abdominal distension. Bowel sounds present. Extremities: 1-2+ pitting edema of bilateral lower extremities (worse in his feet). Skin: Extremities cool to the touch. Neuro: Alert and oriented x3. No focal deficits. Psych: Normal affect. Responds appropriately.  EKG:  The EKG was personally reviewed and demonstrates:  Normal sinus rhythm, rate 88 bpm, with LVH and elevated J-point in leads V2-V3 (does not meet criteria for STEMI) but no acute ischemic changes. QTc automatically read as 516 ms (489 on my calculation). Telemetry:  Telemetry was personally reviewed and demonstrates:  Sinus rhythm with rates in the 60s.  Relevant CV Studies:  Echocardiogram 11/13/2021: Impressions: 1. Left ventricular ejection fraction, by estimation, is 55 to 60%. The  left ventricle has normal function. The left ventricle has no regional  wall motion abnormalities. There is mild left ventricular hypertrophy of  the basal-septal segment. Left  ventricular diastolic parameters are consistent with Grade III diastolic  dysfunction (restrictive). Elevated left atrial pressure.   2. Right ventricular systolic function is normal. The right ventricular  size is mildly enlarged. There is mildly elevated pulmonary artery  systolic pressure.   3. Left atrial size was moderately dilated.   4. The mitral valve is myxomatous. Moderate mitral valve  regurgitation.  No evidence of mitral stenosis.   5. The aortic valve is tricuspid. Aortic valve regurgitation is not  visualized. Aortic valve sclerosis is present, with no evidence of aortic  valve stenosis.   6. The inferior vena cava is dilated in size with >50% respiratory  variability, suggesting right atrial pressure of 8 mmHg.   Laboratory Data:  High Sensitivity Troponin:  No results for input(s): TROPONINIHS in the last 720 hours.   Chemistry Recent Labs  Lab 12/10/21 1339 12/13/21 1011  NA 141 137  K 3.7 3.7  CL 102 100  CO2 21 21*  GLUCOSE 98 290*  BUN 110* 106*  CREATININE 4.85* 4.75*  CALCIUM 8.8 8.6*  MG  --  2.1  GFRNONAA  --  12*  ANIONGAP  --  16*    No results for input(s): PROT, ALBUMIN, AST, ALT, ALKPHOS, BILITOT in the last 168 hours. Lipids No results for input(s): CHOL, TRIG, HDL, LABVLDL, LDLCALC, CHOLHDL in the last 168 hours.  Hematology Recent Labs  Lab 12/13/21 1011  WBC 12.4*  RBC 3.49*  HGB 9.6*  HCT 29.5*  MCV 84.5  MCH 27.5  MCHC 32.5  RDW 18.7*  PLT 386   Thyroid No results for input(s): TSH, FREET4 in the last 168 hours.  BNP Recent Labs  Lab 12/10/21 1339 12/13/21 1011  BNP  --  2,005.0*  PROBNP 12,645*  --     DDimer No results for input(s): DDIMER in the last 168 hours.   Radiology/Studies:  CT ABDOMEN PELVIS WO CONTRAST  Result Date: 12/13/2021 CLINICAL DATA:  76 year old male with history of abnormal chest x-ray. Pneumonia. Pleural  effusion. Suspected malignancy. EXAM: CT CHEST, ABDOMEN AND PELVIS WITHOUT CONTRAST TECHNIQUE: Multidetector CT imaging of the chest, abdomen and pelvis was performed following the standard protocol without IV contrast. RADIATION DOSE REDUCTION: This exam was performed according to the departmental dose-optimization program which includes automated exposure control, adjustment of the mA and/or kV according to patient size and/or use of iterative reconstruction technique. COMPARISON:  Chest CT  11/15/2021. CT the abdomen and pelvis 11/17/2021. FINDINGS: Comment: Today's study is severely limited for detection and characterization of visceral and/or vascular lesions by lack of IV contrast. CT CHEST FINDINGS Cardiovascular: Heart size is mildly enlarged. There is no significant pericardial fluid, thickening or pericardial calcification. There is aortic atherosclerosis, as well as atherosclerosis of the great vessels of the mediastinum and the coronary arteries, including calcified atherosclerotic plaque in the left main, left anterior descending, left circumflex and right coronary arteries. Mediastinum/Nodes: Multiple prominent borderline enlarged mediastinal lymph nodes are noted, nonspecific, but similar to the recent prior study, favored to be reactive. No definite pathologically enlarged mediastinal or hilar lymph nodes are confidently identified on today's noncontrast CT examination. Esophagus is unremarkable in appearance. No axillary lymphadenopathy. Lungs/Pleura: Moderate right chronic partially loculated pleural effusion and small left chronic partially loculated pleural effusion, both of which are stable compared to the prior study. Both of these are associated with extensive areas of pleuroparenchymal thickening and nodular/mass-like architectural distortion in the lungs bilaterally, with associated pleural tails", most compatible with extensive areas of rounded atelectasis. There continues to be some patchy multifocal ground-glass attenuation in the lungs bilaterally, some of which appears improved compared to the prior study, but other areas have clearly increased compared to the prior examination, with some associated septal thickening, most evident in the anterior aspect of the right upper lobe (best appreciated on axial image 58 of series 3). Musculoskeletal: There are no aggressive appearing lytic or blastic lesions noted in the visualized portions of the skeleton. CT ABDOMEN PELVIS FINDINGS  Hepatobiliary: No definite suspicious appearing hepatic lesions are confidently identified on today's noncontrast CT examination. Unenhanced appearance of the gallbladder is unremarkable. Pancreas: No definite pancreatic mass or peripancreatic fluid collections or inflammatory changes are confidently identified on today's noncontrast CT examination. Spleen: Unremarkable. Adrenals/Urinary Tract: Low-attenuation lesions in the upper pole of the right kidney measuring up to 2.9 x 2.3 cm, incompletely characterized on today's non-contrast CT examination, but statistically likely to represent cysts. Other subcentimeter high attenuation lesions are noted in the left kidney, incompletely characterized, but favored to represent proteinaceous/hemorrhagic cysts. Low-attenuation adreniform thickening bilaterally, favored to represent adenomatous hyperplasia. Large left retroperitoneal mass (discussed below) which is favored to be separate from the left adrenal gland, although this does make contact with the anterior aspect of the left adrenal gland such that an adrenal lesion is not entirely excluded. No hydroureteronephrosis. Urinary bladder is markedly distended, but otherwise unremarkable in appearance on today's noncontrast CT examination. Stomach/Bowel: The appearance of the stomach is normal. No pathologic dilatation of small bowel or colon. The appendix is not confidently identified and may be surgically absent. Regardless, there are no inflammatory changes noted adjacent to the cecum to suggest the presence of an acute appendicitis at this time. Vascular/Lymphatic: Aortic atherosclerosis. Large left para-aortic retroperitoneal mass could represent malignant centrally necrotic lymphadenopathy, however, this is favored to represent a primary retroperitoneal lesion (see discussion below). No other lymphadenopathy noted elsewhere in the abdomen or pelvis. Reproductive: Prostate gland and seminal vesicles are unremarkable  in appearance. Other: Again noted is  a large left-sided para-aortic retroperitoneal mass measuring approximately 6.9 x 6.1 x 7.9 cm (axial image 27 of series 2 and coronal image 38 of series 5), previously 6.7 x 5.4 x 6.9 cm). This is intimately associated with the adjacent structures, including the abdominal aorta left renal artery, proximal superior mesenteric artery, anterior aspect of the left adrenal gland, and multiple small bowel loops. The lesion appears separate from the pancreas. The lesion is heterogeneous in attenuation with some central areas that are lower attenuation (21 HU) and peripheral intermediate attenuation (38 HU). No significant volume of ascites. No pneumoperitoneum. Musculoskeletal: There are no aggressive appearing lytic or blastic lesions noted in the visualized portions of the skeleton. IMPRESSION: 1. 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 This may represent a primary retroperitoneal neoplasm, but differential considerations are very broad given the location, and include malignant lymphadenopathy, vascular lesions, lesions of adrenal origin, gastrointestinal lesion of the small bowel, etc. Further evaluation with abdominal MRI with and without IV gadolinium is recommended to better characterize this finding. 2. Shifting pattern of ground-glass attenuation and septal thickening in the lungs is of uncertain etiology and significance, but is overall slightly worsened and likely of infectious or inflammatory etiology. Infiltrative neoplasm such as metastatic adenocarcinoma is difficult to entirely exclude. 3. Chronic bilateral pleural effusions with extensive rounded atelectasis in both lungs, similar to the prior study. 4. Mild cardiomegaly. 5. Aortic atherosclerosis, in addition to left main and three-vessel coronary artery disease. Assessment for  potential risk factor modification, dietary therapy or pharmacologic therapy may be warranted, if clinically indicated. 6. Additional incidental findings, as above. Electronically Signed   By: Vinnie Langton M.D.   On: 12/13/2021 13:31   DG Chest 2 View  Result Date: 12/10/2021 CLINICAL DATA:  Pleural effusions, weight gain pain, edema, CHF EXAM: CHEST - 2 VIEW COMPARISON:  11/19/2021 FINDINGS: Upper normal heart size. Atherosclerotic calcification and mild tortuosity of thoracic aorta. Pulmonary vascularity normal. Extensive scarring in the mid lungs bilaterally with BILATERAL pleural effusions, larger on RIGHT. Bibasilar infiltrates and atelectasis. Nodular focus versus atelectasis in the RIGHT upper lobe again identified. Posterior opacity in the mid to lower chest on lateral view unchanged. No pneumothorax. IMPRESSION: Persistent BILATERAL pleural effusions larger on RIGHT. BILATERAL pulmonary scarring with persistent bibasilar infiltrates and atelectasis. Persistent nodular density or atelectasis at lateral RIGHT upper lobe. Electronically Signed   By: Lavonia Dana M.D.   On: 12/10/2021 14:17   CT Chest Wo Contrast  Result Date: 12/13/2021 CLINICAL DATA:  76 year old male with history of abnormal chest x-ray. Pneumonia. Pleural effusion. Suspected malignancy. EXAM: CT CHEST, ABDOMEN AND PELVIS WITHOUT CONTRAST TECHNIQUE: Multidetector CT imaging of the chest, abdomen and pelvis was performed following the standard protocol without IV contrast. RADIATION DOSE REDUCTION: This exam was performed according to the departmental dose-optimization program which includes automated exposure control, adjustment of the mA and/or kV according to patient size and/or use of iterative reconstruction technique. COMPARISON:  Chest CT 11/15/2021. CT the abdomen and pelvis 11/17/2021. FINDINGS: Comment: Today's study is severely limited for detection and characterization of visceral and/or vascular lesions by lack of IV  contrast. CT CHEST FINDINGS Cardiovascular: Heart size is mildly enlarged. There is no significant pericardial fluid, thickening or pericardial calcification. There is aortic atherosclerosis, as well as atherosclerosis of the great vessels of the mediastinum and the coronary arteries, including calcified atherosclerotic plaque in  the left main, left anterior descending, left circumflex and right coronary arteries. Mediastinum/Nodes: Multiple prominent borderline enlarged mediastinal lymph nodes are noted, nonspecific, but similar to the recent prior study, favored to be reactive. No definite pathologically enlarged mediastinal or hilar lymph nodes are confidently identified on today's noncontrast CT examination. Esophagus is unremarkable in appearance. No axillary lymphadenopathy. Lungs/Pleura: Moderate right chronic partially loculated pleural effusion and small left chronic partially loculated pleural effusion, both of which are stable compared to the prior study. Both of these are associated with extensive areas of pleuroparenchymal thickening and nodular/mass-like architectural distortion in the lungs bilaterally, with associated pleural tails", most compatible with extensive areas of rounded atelectasis. There continues to be some patchy multifocal ground-glass attenuation in the lungs bilaterally, some of which appears improved compared to the prior study, but other areas have clearly increased compared to the prior examination, with some associated septal thickening, most evident in the anterior aspect of the right upper lobe (best appreciated on axial image 58 of series 3). Musculoskeletal: There are no aggressive appearing lytic or blastic lesions noted in the visualized portions of the skeleton. CT ABDOMEN PELVIS FINDINGS Hepatobiliary: No definite suspicious appearing hepatic lesions are confidently identified on today's noncontrast CT examination. Unenhanced appearance of the gallbladder is unremarkable.  Pancreas: No definite pancreatic mass or peripancreatic fluid collections or inflammatory changes are confidently identified on today's noncontrast CT examination. Spleen: Unremarkable. Adrenals/Urinary Tract: Low-attenuation lesions in the upper pole of the right kidney measuring up to 2.9 x 2.3 cm, incompletely characterized on today's non-contrast CT examination, but statistically likely to represent cysts. Other subcentimeter high attenuation lesions are noted in the left kidney, incompletely characterized, but favored to represent proteinaceous/hemorrhagic cysts. Low-attenuation adreniform thickening bilaterally, favored to represent adenomatous hyperplasia. Large left retroperitoneal mass (discussed below) which is favored to be separate from the left adrenal gland, although this does make contact with the anterior aspect of the left adrenal gland such that an adrenal lesion is not entirely excluded. No hydroureteronephrosis. Urinary bladder is markedly distended, but otherwise unremarkable in appearance on today's noncontrast CT examination. Stomach/Bowel: The appearance of the stomach is normal. No pathologic dilatation of small bowel or colon. The appendix is not confidently identified and may be surgically absent. Regardless, there are no inflammatory changes noted adjacent to the cecum to suggest the presence of an acute appendicitis at this time. Vascular/Lymphatic: Aortic atherosclerosis. Large left para-aortic retroperitoneal mass could represent malignant centrally necrotic lymphadenopathy, however, this is favored to represent a primary retroperitoneal lesion (see discussion below). No other lymphadenopathy noted elsewhere in the abdomen or pelvis. Reproductive: Prostate gland and seminal vesicles are unremarkable in appearance. Other: Again noted is a large left-sided para-aortic retroperitoneal mass measuring approximately 6.9 x 6.1 x 7.9 cm (axial image 27 of series 2 and coronal image 38 of  series 5), previously 6.7 x 5.4 x 6.9 cm). This is intimately associated with the adjacent structures, including the abdominal aorta left renal artery, proximal superior mesenteric artery, anterior aspect of the left adrenal gland, and multiple small bowel loops. The lesion appears separate from the pancreas. The lesion is heterogeneous in attenuation with some central areas that are lower attenuation (21 HU) and peripheral intermediate attenuation (38 HU). No significant volume of ascites. No pneumoperitoneum. Musculoskeletal: There are no aggressive appearing lytic or blastic lesions noted in the visualized portions of the skeleton. IMPRESSION: 1. Interval enlargement of an aggressive appearing presumably malignant lesion in the left retroperitoneum which currently measures 6.9 x 6.1  x 7.9 cm, highly concerning for neoplasm. This may represent a primary retroperitoneal neoplasm, but differential considerations are very broad given the location, and include malignant lymphadenopathy, vascular lesions, lesions of adrenal origin, gastrointestinal lesion of the small bowel, etc. Further evaluation with abdominal MRI with and without IV gadolinium is recommended to better characterize this finding. 2. Shifting pattern of ground-glass attenuation and septal thickening in the lungs is of uncertain etiology and significance, but is overall slightly worsened and likely of infectious or inflammatory etiology. Infiltrative neoplasm such as metastatic adenocarcinoma is difficult to entirely exclude. 3. Chronic bilateral pleural effusions with extensive rounded atelectasis in both lungs, similar to the prior study. 4. Mild cardiomegaly. 5. Aortic atherosclerosis, in addition to left main and three-vessel coronary artery disease. Assessment for potential risk factor modification, dietary therapy or pharmacologic therapy may be warranted, if clinically indicated. 6. Additional incidental findings, as above. Electronically Signed    By: Vinnie Langton M.D.   On: 12/13/2021 13:31   DG Chest Portable 1 View  Result Date: 12/13/2021 CLINICAL DATA:  Shortness of breath.  Renal failure. EXAM: PORTABLE CHEST 1 VIEW COMPARISON:  12/10/2021 radiograph And prior studies FINDINGS: Cardiomediastinal silhouette is unchanged. Pulmonary vascular congestion again noted. Moderate RIGHT pleural effusion and small to moderate LEFT pleural effusion are again noted. Unchanged bilateral pulmonary opacities/scarring again identified. Nodular opacity overlying the RIGHT UPPER lung appears slightly more prominent which is likely technical. There is no evidence of pneumothorax or acute bony abnormality. IMPRESSION: Little significant change in appearance of the chest with pulmonary vascular congestion, bilateral pleural effusions and unchanged bilateral pulmonary opacities. Electronically Signed   By: Margarette Canada M.D.   On: 12/13/2021 10:49     Assessment and Plan:   Chronic Diastolic CHF Patient has a long history of diastolic CHF with multiple hospitalizations for this in the past.  Most recently admitted last month for CHF exacerbation and CAP.  Echo during that admission on 11/13/2021 showed LVEF of 55-60% with mild LVH and grade 3 diastolic dysfunction.  RV was mildly enlarged with normal systolic function. Prior PYP scan in 10/2019 negative for TTR amyloid. He now presents with worsening dyspnea on exertion and lower extremity edema.  BMP markedly elevated at 2005 which is higher than it was during most recent admission. Chest x-ray showed pulmonary vascular congestion, bilateral pleural effusions, and unchanged bilateral pulmonary opacities.  - Volume overload on exam. - Given worsening renal function, will discuss diuresis with MD. Will likely need Nephrology input. He does have an AV fistula in his right arm and wonder if we are heading to dialysis.  Paroxysmal Atrial Fibrillation/Flutter  Maintaining sinus rhythm. - Continue Amiodarone '200mg'$   daily. - Continue Eliquis 2.'5mg'$  twice daily. Reduced dose due to renal function and weight.  Hypertension BP mildly elevated. - Suspect this will improve with management of his volume status. - On Cardizem '180mg'$  daily at home. Can restart this.  Hyperlipidemia - Continue home Lipitor '80mg'$  daily.  CKD Stage IV Creatinine 4.75 on admission. Baseline around 3.8. - Would recommend consulting Nephrology.  Otherwise, per primary team: - Left retroperitoneal mass concerning for malignancy - Dysuria  - Type 2 diabetes  - Chronic anemia   Risk Assessment/Risk Scores:    New York Heart Association (NYHA) Functional Class NYHA Class III-IV  For questions or updates, please contact [facility] Please consult [email or web address] for contact info under    Signed, Darreld Mclean, PA-C  12/13/2021 5:57 PM

## 2021-12-13 NOTE — Progress Notes (Signed)
Interpreter   shadi [phone], physical assessment and medication administration done with help of onterpreter

## 2021-12-13 NOTE — ED Notes (Signed)
Paged Dr Harl Bowie , 2nd page via beeper for Cards consult

## 2021-12-13 NOTE — ED Notes (Signed)
Seen in lobby after registration, SpO2 90-81% on r/a, HR 80-85, RR 33.  Taken to room 4 and placed on 4LPM [place], normally wear 2-2.5 lpm at home.

## 2021-12-13 NOTE — Consult Note (Addendum)
Palliative Medicine Inpatient Consult Note  Consulting Provider: Varney Biles, MD  Reason for consult:   [facility] Palliative Medicine Consult  Reason for Consult? goals of care   12/13/2021  HPI:  Per intake H&P -->  76 year old male with a past medical history of chronic diastolic CHF, BPH, anemia chronic disease, stage IV CKD, type II DM, depression, hypertension, history of GI bleed, paroxysmal atrial fibrillation, secondary hyper parathyroidism, thyroid disease, history of cigarette smoking who presented to the emergency department with urinary burning, dyspnea and progressively worse edema.  Work-up shows BNP of over 2000 pg/mL, worsening renal function and retroperitoneal mass.  The case was discussed with me by Dr. Kathrynn Humble.  The transfer was accepted to either [facility] facility.  He subsequently spoke to cardiology and they requested a cardiac telemetry bed at [facility].   Palliative care asked to get involved in the setting of worsening kidney function and large left retroperitoneal paraglioma.   Clinical Assessment/Goals of Care:  *Please note that this is a verbal dictation therefore any spelling or grammatical errors are due to the "[employer]" system interpretation.  Farsi interpretation through [employer] --> [place] [phone]  I have reviewed medical records including EPIC notes, labs and imaging, received report from bedside RN, assessed the patient.    I met with [name] to further discuss diagnosis prognosis, GOC, EOL wishes, disposition and options.   I introduced Palliative Medicine as specialized medical care for people living with serious illness. It focuses on providing relief from the symptoms and stress of a serious illness. The goal is to improve quality of life for both the patient and the family.  A brief medical review was completed with Deanta.  We discussed his diastolic heart failure, renal disease for which he  has had a right AV fistula placed in anticipation of dialysis, paroxysmal atrial fibrillation, hypertension, and depression.  We did discuss that there was a large left retroperitoneal mass identified upon his last hospital stay which did get biopsied.  Patient shares he is supposed to see an endocrinologist and surgeon for this though these appointments have not yet been set up.  At this point Ah endorses symptoms such as generalized weakness, shortness of breath, and horrific  penile burning.  Dutch is from [place] originally.  He moved to [place], [place] 15 years ago.  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  He is married to his spouse, [place]. He shares that he has 1 son who lives in [place] and is an ophthalmologist.  He is a man of faith and practices within the Muslim denomination.  Prior to hospitalization Malcome was living in a single-family home with his wife.  He shares that he was able to do most basic activities of daily living.  Appetite was fair prior to hospitalization.  A detailed discussion was had today regarding advanced directives.  Patient has never completed these but would be interested in doing so while hospitalized.  A copy of these was left at patient's bedside for he and his wife to review.  Encouraged Tobiah to consider DNR/DNI status understanding evidenced based poor outcomes in similar hospitalized patient, as the cause of arrest is likely associated with advanced chronic/terminal illness rather than an easily reversible acute cardio-pulmonary event. I explained that DNR/DNI does not change the medical plan and it only comes into effect after a person has arrested (died).  It is a protective measure to keep  [place] from harming the patient in their last moments of life. Jemery shares that he would defer to his medical team in regard to these decisions.  Discussion:  Reviewed with Cameren that he has had issues with "fluid  building up around his lungs" for the past 3 months.  He was recently hospitalized for this problem and felt better upon discharge for period of roughly 4 days prior to then feeling short of breath again.  I asked him if he understood what heart failure was in the term cardiorenal syndrome.  He said from what he thinks he is in fair health.  We reviewed the correlation between the heart and the kidneys and if once dysfunctioning it causes the other to dysfunction.  We reviewed that right now he is encroaching a more severe phase of kidney failure for which dialysis may be needed. Pablo at the time of our conversation is open to receiving dialysis.  I asked Nymir if the extent of his disease had been explained to him thoroughly.  He does not know if it has an shares with me he feels he is in good health.  We reviewed that he has multiple acute on chronic conditions that are over time anticipated to worsen and its important that we have open honest conversation about what his wishes would be in the setting of these.  Nathanyal shares that he has not been able to mobilize as much as he would like in the setting of feeling symptomatically short of breath with mobility.  I asked Halil if I could call his wife to set up a time for [place] all to meet together and talk about what is going on which he is amenable to.  Patient's RN, Velia and I did an assessment of his belly which was quite swollen and painful.  A bladder scan was complete with over 700 mL of urine.  She is going to pursue a straight catheterization after discussion with the hospitalist.  Discussed the importance of continued conversation with family and their  medical providers regarding overall plan of care and treatment options, ensuring decisions are within the context of the patients values and GOCs. ______________________________________________ Addendum:  I called Glenwood's wife this evening.  We discussed his reason for hospitalization and  that there is more work-up taking place right now.  Patient's wife endorses that Derrell has been hospitalized recently in the setting of his heart failure.   Discussed patient's kidney disease and how the topic of hemodialysis was broached in the past though patient was resistant to this. [name] shares that he has a lot of fear in terms of death and dying and typically does not like to talk about these topics.  We reviewed the importance of talking about everything going on with him right now in the setting of his diastolic heart failure, his kidney disease, and this left retroperitoneal paraganglioma.  [name] is interested in meeting with the palliative care team and understands the importance of talking about what to do if [place] health deteriorates.  [name] shares that they are Muslim though they are not strictly Muslim and that they do not believe the more suffering in this life the greater the after life.  Decision Maker: [name] (spouse) [phone]  SUMMARY OF RECOMMENDATIONS   Full Code --> Plan to discuss this in greater detail during family meeting. Patient defers to his medical team  Plan for family meeting tomorrow, if palliative care team member will arrange a time to discuss CODE  STATUS, hemodialysis, and his paraganglioma  Palliative care will continues to support Braydyn during his hospital stay  Code Status/Advance Care Planning: FULL CODE   Symptom Management:  Urinary Burning: Bladder scan --> urinary retention plan to straight cath  UA looked okay  DOE: - Dialstolic heart failure management per primary  Generalized Weakness: - PT/OT  Palliative Prophylaxis:  Aspiration, Bowel Regimen, Delirium Protocol, Frequent Pain Assessment, Oral Care, Palliative Wound Care, and Turn Reposition  Additional Recommendations (Limitations, Scope, Preferences): Continue current scope of care for the time being  Psycho-social/Spiritual:  Desire for further Chaplaincy  support: No Additional Recommendations: Education on diastolic heart failure   Prognosis: Patient has had recurrent rehospitalization's over the past 6 months as well as ED visits, he is quite frail and has multiple chronic comorbidities.  He has a increased 75-monthmortality risk.  Discharge Planning: Discharge plan is unclear at this time.  Vitals:   12/13/21 1530 12/13/21 1646  BP: (!) 123/58 (!) 153/69  Pulse: (!) 53 (!) 59  Resp: 12 16  Temp:    SpO2: 98% 98%   No intake or output data in the 24 hours ending 12/13/21 1740 Last Weight  Most recent update: 12/13/2021  4:47 PM    Weight  52.7 kg (116 lb 2.9 oz)            Gen: Frail elderly PDjiboutimale in moderate distress HEENT: moist mucous membranes CV: Regular rate and rhythm PULM: On 2 L nasal cannula, basal crackles ABD: Distended painful and suprapubic region EXT: Bilateral lower extremity edema, scrotal edema Neuro: Alert and oriented x3-Farsi speaking  PPS: 50%   This conversation/these recommendations were discussed with patient primary care team, Dr. ZRoosevelt Locks Total Time: 115  Billing based on MDM: High  Problems Addressed: One acute or chronic illness or injury that poses a threat to life or bodily function  Amount and/or Complexity of Data: Category 3:Discussion of management or test interpretation with external physician/other qualified health care professional/appropriate source (not separately reported)  Risks: Decision not to resuscitate or to de-escalate care because of poor prognosis ______________________________________________________ MLandessTeam Team Cell Phone: [phone] utilize secure chat with additional questions, if there is no response within 30 minutes please call the above phone number  Palliative Medicine Team providers are available by phone from 7am to 7pm daily and can be reached through the team cell phone.  Should this patient  require assistance outside of these hours, please call the patient's attending physician.

## 2021-12-13 NOTE — ED Provider Notes (Signed)
[facility] EMERGENCY DEPARTMENT Provider Note   CSN: [id number] Arrival date & time: 12/13/21  0956     History  Chief Complaint  Patient presents with   Shortness of Breath    Adam Reid is a 75 y.o. male.  HPI    75 y.o. male with medical history significant of CKD stage IV, chronic diastolic CHF, IIDM, chronic hypoxic respiratory failure on 2 L, HTN, PAF on Eliquis, moderate protein calorie malnutrition, presented with increasing shortness of breath.  Patient was admitted to the hospital in May for CHF exacerbation.  More recently, he has had increased leg swelling.  PCP has increased torsemide dose for the patient, and family indicates that his swelling has come down, shortness of breath has been unchanged.  However, patient's renal function was worsening and they are advised to bring the patient to the ER for recheck of his blood work and admit if he is not improving.  Patient is also complaining of some burning lower quadrant abdominal pain that is constant.  He has no history of pain like this before.  Pain started at 3 AM.  There is no burning with urination.  No rectal pain.  No fevers, chills.   Home Medications Prior to Admission medications   Medication Sig Start Date End Date Taking? Authorizing Provider  amiodarone (PACERONE) 200 MG tablet Take 1 tablet (200 mg total) by mouth daily. Follow up appt required for further refills. Please call [phone] to schedule an appt 11/30/21   Clegg, Amy D, NP  atorvastatin (LIPITOR) 80 MG tablet TAKE 1 TABLET(80 MG) BY MOUTH DAILY 12/22/20   Greene, Jeffrey R, MD  blood glucose meter kit and supplies Use up to two times daily as directed  ICD10 E10.9 E11.9 02/20/19   Greene, Jeffrey R, MD  CALPHRON 667 MG tablet Take 667 mg by mouth 3 (three) times daily. 11/09/21   [provider]  Cholecalciferol (VITAMIN D3 PO) Take 1 tablet by mouth daily.    [provider]  citalopram (CELEXA) 20 MG tablet  TAKE 1 TABLET(20 MG) BY MOUTH DAILY 11/16/21   Greene, Jeffrey R, MD  diltiazem (CARDIZEM CD) 180 MG 24 hr capsule TAKE 1 CAPSULE(180 MG) BY MOUTH DAILY 08/24/21   Bensimhon, Daniel R, MD  ELIQUIS 2.5 MG TABS tablet TAKE 1 TABLET(2.5 MG) BY MOUTH TWICE DAILY 10/30/21   Bensimhon, Daniel R, MD  glipiZIDE (GLUCOTROL XL) 2.5 MG 24 hr tablet Take 1 tablet (2.5 mg total) by mouth daily with breakfast. With 5mg for total dose of 7.5mg qd. 11/26/21   Greene, Jeffrey R, MD  glipiZIDE (GLUCOTROL XL) 5 MG 24 hr tablet Take 1 tablet (5 mg total) by mouth daily with breakfast. 11/22/21   Joseph, Preetha, MD  GlucoCom Lancets MISC Use for home glucose monitoring 09/02/15   Jeffery, Chelle, PA  glucose blood (ONETOUCH VERIO) test strip Test up to 2 times per day.  Uncontrolled diabetes with hyperglycemia and stage 4 CKD. 04/13/19   Greene, Jeffrey R, MD  polyethylene glycol (MIRALAX / GLYCOLAX) 17 g packet Take 17 g by mouth daily as needed for mild constipation or moderate constipation (MIX AND DRINK).    [provider]  sitaGLIPtin (JANUVIA) 25 MG tablet TAKE 1 TABLET(25 MG) BY MOUTH DAILY 08/31/21   Greene, Jeffrey R, MD  sodium bicarbonate 650 MG tablet Take 650 mg by mouth 2 (two) times daily.    [provider]  tamsulosin (FLOMAX) 0.4 MG CAPS capsule Take 0.4 mg   by mouth daily.    [provider]  torsemide (DEMADEX) 20 MG tablet Take 2 tablets (40 mg total) by mouth 2 (two) times daily. 10/05/21   Milford, Maricela Bo, FNP  vitamin B-12 (CYANOCOBALAMIN) 500 MCG tablet Take 500 mcg by mouth daily.    [provider]      Allergies    Pork-derived products    Review of Systems   Review of Systems  All other systems reviewed and are negative.  Physical Exam Updated Vital Signs BP 119/66   Pulse 61   Temp (!) 97.1 F (36.2 C) (Tympanic)   Resp 11   Ht 5' 6" (1.676 m)   Wt 52.9 kg   SpO2 99%   BMI 18.82 kg/m  Physical Exam Vitals and nursing note reviewed.   Constitutional:      Appearance: He is well-developed.  HENT:     Head: Atraumatic.  Neck:     Vascular: No JVD.  Cardiovascular:     Rate and Rhythm: Normal rate.  Pulmonary:     Effort: Pulmonary effort is normal.     Breath sounds: Examination of the right-lower field reveals decreased breath sounds. Decreased breath sounds and rales present.  Musculoskeletal:     Cervical back: Neck supple.     Right lower leg: No edema.     Left lower leg: No edema.  Skin:    General: Skin is warm.  Neurological:     Mental Status: He is alert and oriented to person, place, and time.    ED Results / Procedures / Treatments   Labs (all labs ordered are listed, but only abnormal results are displayed) Labs Reviewed  BASIC METABOLIC PANEL - Abnormal; Notable for the following components:      Result Value   CO2 21 (*)    Glucose, Bld 290 (*)    BUN 106 (*)    Creatinine, Ser 4.75 (*)    Calcium 8.6 (*)    GFR, Estimated 12 (*)    Anion gap 16 (*)    All other components within normal limits  BRAIN NATRIURETIC PEPTIDE - Abnormal; Notable for the following components:   B Natriuretic Peptide 2,005.0 (*)    All other components within normal limits  CBC WITH DIFFERENTIAL/PLATELET - Abnormal; Notable for the following components:   WBC 12.4 (*)    RBC 3.49 (*)    Hemoglobin 9.6 (*)    HCT 29.5 (*)    RDW 18.7 (*)    Neutro Abs 11.3 (*)    Lymphs Abs 0.4 (*)    All other components within normal limits  URINALYSIS, ROUTINE W REFLEX MICROSCOPIC - Abnormal; Notable for the following components:   Color, Urine STRAW (*)    Glucose, UA 100 (*)    Hgb urine dipstick SMALL (*)    Protein, ur 100 (*)    All other components within normal limits  URINALYSIS, MICROSCOPIC (REFLEX) - Abnormal; Notable for the following components:   Bacteria, UA FEW (*)    All other components within normal limits  MAGNESIUM    EKG EKG Interpretation  Date/Time:  Sunday December 13 2021 10:10:03  EDT Ventricular Rate:  88 PR Interval:  237 QRS Duration: 93 QT Interval:  426 QTC Calculation: 516 R Axis:   3 Text Interpretation: Sinus rhythm Prolonged PR interval Consider left atrial enlargement Left ventricular hypertrophy Anterior Q waves, possibly due to LVH Prolonged QT interval No significant change since last tracing Confirmed by  Varney Biles [phone]) on 12/13/2021 10:55:03 AM  Radiology CT ABDOMEN PELVIS WO CONTRAST  Result Date: 12/13/2021 CLINICAL DATA:  76 year old male with history of abnormal chest x-ray. Pneumonia. Pleural effusion. Suspected malignancy. EXAM: CT CHEST, ABDOMEN AND PELVIS WITHOUT CONTRAST TECHNIQUE: Multidetector CT imaging of the chest, abdomen and pelvis was performed following the standard protocol without IV contrast. RADIATION DOSE REDUCTION: This exam was performed according to the departmental dose-optimization program which includes automated exposure control, adjustment of the mA and/or kV according to patient size and/or use of iterative reconstruction technique. COMPARISON:  Chest CT 11/15/2021. CT the abdomen and pelvis 11/17/2021. FINDINGS: Comment: Today's study is severely limited for detection and characterization of visceral and/or vascular lesions by lack of IV contrast. CT CHEST FINDINGS Cardiovascular: Heart size is mildly enlarged. There is no significant pericardial fluid, thickening or pericardial calcification. There is aortic atherosclerosis, as well as atherosclerosis of the great vessels of the mediastinum and the coronary arteries, including calcified atherosclerotic plaque in the left main, left anterior descending, left circumflex and right coronary arteries. Mediastinum/Nodes: Multiple prominent borderline enlarged mediastinal lymph nodes are noted, nonspecific, but similar to the recent prior study, favored to be reactive. No definite pathologically enlarged mediastinal or hilar lymph nodes are confidently identified on today's  noncontrast CT examination. Esophagus is unremarkable in appearance. No axillary lymphadenopathy. Lungs/Pleura: Moderate right chronic partially loculated pleural effusion and small left chronic partially loculated pleural effusion, both of which are stable compared to the prior study. Both of these are associated with extensive areas of pleuroparenchymal thickening and nodular/mass-like architectural distortion in the lungs bilaterally, with associated pleural tails", most compatible with extensive areas of rounded atelectasis. There continues to be some patchy multifocal ground-glass attenuation in the lungs bilaterally, some of which appears improved compared to the prior study, but other areas have clearly increased compared to the prior examination, with some associated septal thickening, most evident in the anterior aspect of the right upper lobe (best appreciated on axial image 58 of series 3). Musculoskeletal: There are no aggressive appearing lytic or blastic lesions noted in the visualized portions of the skeleton. CT ABDOMEN PELVIS FINDINGS Hepatobiliary: No definite suspicious appearing hepatic lesions are confidently identified on today's noncontrast CT examination. Unenhanced appearance of the gallbladder is unremarkable. Pancreas: No definite pancreatic mass or peripancreatic fluid collections or inflammatory changes are confidently identified on today's noncontrast CT examination. Spleen: Unremarkable. Adrenals/Urinary Tract: Low-attenuation lesions in the upper pole of the right kidney measuring up to 2.9 x 2.3 cm, incompletely characterized on today's non-contrast CT examination, but statistically likely to represent cysts. Other subcentimeter high attenuation lesions are noted in the left kidney, incompletely characterized, but favored to represent proteinaceous/hemorrhagic cysts. Low-attenuation adreniform thickening bilaterally, favored to represent adenomatous hyperplasia. Large left  retroperitoneal mass (discussed below) which is favored to be separate from the left adrenal gland, although this does make contact with the anterior aspect of the left adrenal gland such that an adrenal lesion is not entirely excluded. No hydroureteronephrosis. Urinary bladder is markedly distended, but otherwise unremarkable in appearance on today's noncontrast CT examination. Stomach/Bowel: The appearance of the stomach is normal. No pathologic dilatation of small bowel or colon. The appendix is not confidently identified and may be surgically absent. Regardless, there are no inflammatory changes noted adjacent to the cecum to suggest the presence of an acute appendicitis at this time. Vascular/Lymphatic: Aortic atherosclerosis. Large left para-aortic retroperitoneal mass could represent malignant centrally necrotic lymphadenopathy, however, this is favored to represent a  primary retroperitoneal lesion (see discussion below). No other lymphadenopathy noted elsewhere in the abdomen or pelvis. Reproductive: Prostate gland and seminal vesicles are unremarkable in appearance. Other: Again noted is a large left-sided para-aortic retroperitoneal mass measuring approximately 6.9 x 6.1 x 7.9 cm (axial image 27 of series 2 and coronal image 38 of series 5), previously 6.7 x 5.4 x 6.9 cm). This is intimately associated with the adjacent structures, including the abdominal aorta left renal artery, proximal superior mesenteric artery, anterior aspect of the left adrenal gland, and multiple small bowel loops. The lesion appears separate from the pancreas. The lesion is heterogeneous in attenuation with some central areas that are lower attenuation (21 HU) and peripheral intermediate attenuation (38 HU). No significant volume of ascites. No pneumoperitoneum. Musculoskeletal: There are no aggressive appearing lytic or blastic lesions noted in the visualized portions of the skeleton. IMPRESSION: 1. Interval enlargement of an  aggressive appearing presumably malignant lesion in the left retroperitoneum which currently measures 6.9 x 6.1 x 7.9 cm, highly concerning for neoplasm. This may represent a primary retroperitoneal neoplasm, but differential considerations are very broad given the location, and include malignant lymphadenopathy, vascular lesions, lesions of adrenal origin, gastrointestinal lesion of the small bowel, etc. Further evaluation with abdominal MRI with and without IV gadolinium is recommended to better characterize this finding. 2. Shifting pattern of ground-glass attenuation and septal thickening in the lungs is of uncertain etiology and significance, but is overall slightly worsened and likely of infectious or inflammatory etiology. Infiltrative neoplasm such as metastatic adenocarcinoma is difficult to entirely exclude. 3. Chronic bilateral pleural effusions with extensive rounded atelectasis in both lungs, similar to the prior study. 4. Mild cardiomegaly. 5. Aortic atherosclerosis, in addition to left main and three-vessel coronary artery disease. Assessment for potential risk factor modification, dietary therapy or pharmacologic therapy may be warranted, if clinically indicated. 6. Additional incidental findings, as above. Electronically Signed   By: Vinnie Langton M.D.   On: 12/13/2021 13:31   CT Chest Wo Contrast  Result Date: 12/13/2021 CLINICAL DATA:  76 year old male with history of abnormal chest x-ray. Pneumonia. Pleural effusion. Suspected malignancy. EXAM: CT CHEST, ABDOMEN AND PELVIS WITHOUT CONTRAST TECHNIQUE: Multidetector CT imaging of the chest, abdomen and pelvis was performed following the standard protocol without IV contrast. RADIATION DOSE REDUCTION: This exam was performed according to the departmental dose-optimization program which includes automated exposure control, adjustment of the mA and/or kV according to patient size and/or use of iterative reconstruction technique. COMPARISON:   Chest CT 11/15/2021. CT the abdomen and pelvis 11/17/2021. FINDINGS: Comment: Today's study is severely limited for detection and characterization of visceral and/or vascular lesions by lack of IV contrast. CT CHEST FINDINGS Cardiovascular: Heart size is mildly enlarged. There is no significant pericardial fluid, thickening or pericardial calcification. There is aortic atherosclerosis, as well as atherosclerosis of the great vessels of the mediastinum and the coronary arteries, including calcified atherosclerotic plaque in the left main, left anterior descending, left circumflex and right coronary arteries. Mediastinum/Nodes: Multiple prominent borderline enlarged mediastinal lymph nodes are noted, nonspecific, but similar to the recent prior study, favored to be reactive. No definite pathologically enlarged mediastinal or hilar lymph nodes are confidently identified on today's noncontrast CT examination. Esophagus is unremarkable in appearance. No axillary lymphadenopathy. Lungs/Pleura: Moderate right chronic partially loculated pleural effusion and small left chronic partially loculated pleural effusion, both of which are stable compared to the prior study. Both of these are associated with extensive areas of pleuroparenchymal thickening and nodular/mass-like  architectural distortion in the lungs bilaterally, with associated pleural tails", most compatible with extensive areas of rounded atelectasis. There continues to be some patchy multifocal ground-glass attenuation in the lungs bilaterally, some of which appears improved compared to the prior study, but other areas have clearly increased compared to the prior examination, with some associated septal thickening, most evident in the anterior aspect of the right upper lobe (best appreciated on axial image 58 of series 3). Musculoskeletal: There are no aggressive appearing lytic or blastic lesions noted in the visualized portions of the skeleton. CT ABDOMEN PELVIS  FINDINGS Hepatobiliary: No definite suspicious appearing hepatic lesions are confidently identified on today's noncontrast CT examination. Unenhanced appearance of the gallbladder is unremarkable. Pancreas: No definite pancreatic mass or peripancreatic fluid collections or inflammatory changes are confidently identified on today's noncontrast CT examination. Spleen: Unremarkable. Adrenals/Urinary Tract: Low-attenuation lesions in the upper pole of the right kidney measuring up to 2.9 x 2.3 cm, incompletely characterized on today's non-contrast CT examination, but statistically likely to represent cysts. Other subcentimeter high attenuation lesions are noted in the left kidney, incompletely characterized, but favored to represent proteinaceous/hemorrhagic cysts. Low-attenuation adreniform thickening bilaterally, favored to represent adenomatous hyperplasia. Large left retroperitoneal mass (discussed below) which is favored to be separate from the left adrenal gland, although this does make contact with the anterior aspect of the left adrenal gland such that an adrenal lesion is not entirely excluded. No hydroureteronephrosis. Urinary bladder is markedly distended, but otherwise unremarkable in appearance on today's noncontrast CT examination. Stomach/Bowel: The appearance of the stomach is normal. No pathologic dilatation of small bowel or colon. The appendix is not confidently identified and may be surgically absent. Regardless, there are no inflammatory changes noted adjacent to the cecum to suggest the presence of an acute appendicitis at this time. Vascular/Lymphatic: Aortic atherosclerosis. Large left para-aortic retroperitoneal mass could represent malignant centrally necrotic lymphadenopathy, however, this is favored to represent a primary retroperitoneal lesion (see discussion below). No other lymphadenopathy noted elsewhere in the abdomen or pelvis. Reproductive: Prostate gland and seminal vesicles are  unremarkable in appearance. Other: Again noted is a large left-sided para-aortic retroperitoneal mass measuring approximately 6.9 x 6.1 x 7.9 cm (axial image 27 of series 2 and coronal image 38 of series 5), previously 6.7 x 5.4 x 6.9 cm). This is intimately associated with the adjacent structures, including the abdominal aorta left renal artery, proximal superior mesenteric artery, anterior aspect of the left adrenal gland, and multiple small bowel loops. The lesion appears separate from the pancreas. The lesion is heterogeneous in attenuation with some central areas that are lower attenuation (21 HU) and peripheral intermediate attenuation (38 HU). No significant volume of ascites. No pneumoperitoneum. Musculoskeletal: There are no aggressive appearing lytic or blastic lesions noted in the visualized portions of the skeleton. IMPRESSION: 1. 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 This may represent a primary retroperitoneal neoplasm, but differential considerations are very broad given the location, and include malignant lymphadenopathy, vascular lesions, lesions of adrenal origin, gastrointestinal lesion of the small bowel, etc. Further evaluation with abdominal MRI with and without IV gadolinium is recommended to better characterize this finding. 2. Shifting pattern of ground-glass attenuation and septal thickening in the lungs is of uncertain etiology and significance, but is overall slightly worsened and likely of infectious or inflammatory etiology. Infiltrative neoplasm such as metastatic adenocarcinoma is difficult to entirely exclude.   3. Chronic bilateral pleural effusions with extensive rounded atelectasis in both lungs, similar to the prior study. 4. Mild cardiomegaly. 5. Aortic atherosclerosis, in addition to left main and three-vessel coronary artery disease.  Assessment for potential risk factor modification, dietary therapy or pharmacologic therapy may be warranted, if clinically indicated. 6. Additional incidental findings, as above. Electronically Signed   By: Daniel  Entrikin M.D.   On: 12/13/2021 13:31   DG Chest Portable 1 View  Result Date: 12/13/2021 CLINICAL DATA:  Shortness of breath.  Renal failure. EXAM: PORTABLE CHEST 1 VIEW COMPARISON:  12/10/2021 radiograph And prior studies FINDINGS: Cardiomediastinal silhouette is unchanged. Pulmonary vascular congestion again noted. Moderate RIGHT pleural effusion and small to moderate LEFT pleural effusion are again noted. Unchanged bilateral pulmonary opacities/scarring again identified. Nodular opacity overlying the RIGHT UPPER lung appears slightly more prominent which is likely technical. There is no evidence of pneumothorax or acute bony abnormality. IMPRESSION: Little significant change in appearance of the chest with pulmonary vascular congestion, bilateral pleural effusions and unchanged bilateral pulmonary opacities. Electronically Signed   By: Jeffrey  Hu M.D.   On: 12/13/2021 10:49    Procedures .Critical Care Performed by: Nanavati, Ankit, MD Authorized by: Nanavati, Ankit, MD   Critical care provider statement:    Critical care time (minutes):  52   Critical care was necessary to treat or prevent imminent or life-threatening deterioration of the following conditions:  Renal failure and CNS failure or compromise   Critical care was time spent personally by me on the following activities:  Development of treatment plan with patient or surrogate, discussions with consultants, evaluation of patient's response to treatment, examination of patient, ordering and review of laboratory studies, ordering and review of radiographic studies, ordering and performing treatments and interventions, pulse oximetry, re-evaluation of patient's condition and review of old charts    Medications Ordered in  ED Medications  fentaNYL (SUBLIMAZE) injection 50 mcg (50 mcg Intravenous Given 12/13/21 1242)  acetaminophen (TYLENOL) tablet 1,000 mg (1,000 mg Oral Given 12/13/21 1241)    ED Course/ Medical Decision Making/ A&P                           Medical Decision Making Amount and/or Complexity of Data Reviewed Labs: ordered. Radiology: ordered.  Risk OTC drugs. Prescription drug management. Decision regarding hospitalization.   This patient presents to the ED with chief complaint(s) of shortness of breath, abdominal pain, recheck on renal function with pertinent past medical history of CHF, CKD, recent retroperitoneal mass that was biopsied which further complicates the presenting complaint. The complaint involves an extensive differential diagnosis and also carries with it a high risk of complications and morbidity.    The differential diagnosis includes : Acute on chronic CHF, pleural effusion, pulmonary edema, severe anemia, electrolyte abnormality.  Also concerns for worsening renal failure and resultant uremia and hyperkalemia.  For the constant severe burning pain in the pelvic area, pain could be secondary to cancer or spasms.  Kidney stone considered in the differential.  Patient is not having any burning with urination, therefore doubt that this is cystitis  The initial plan is to order basic labs, chest x-ray, CT chest abdomen and pelvis.   Additional history obtained: Additional history obtained from patient's wife Records reviewed previous admission documents  Independent labs interpretation:  The following labs were independently interpreted: Creatinine is at baseline over 4.  Patient's BUN is over 100.  It appears that there has been   conversations about starting dialysis. BNP is over 2000  Independent visualization of imaging: - I independently visualized the following imaging with scope of interpretation limited to determining acute life threatening conditions related to  emergency care: X-ray of the chest, which revealed right-sided pleural effusion  Treatment and Reassessment: It appears clinically that patient has having acute on chronic renal failure and acute on chronic CHF.  He also has cardiorenal syndrome.  He also appears dry.  Wife indicates that there has been some weight loss and lower pitting edema in the legs.  I do not think we will diurese him right now.  CT chest abdomen pelvis without contrast were ordered.  There is clear pleural effusion.  Perhaps it needs drainage.  It could be malignant effusion in the setting of patient having neoplasm type findings.  CT abdomen does indicate worsening retroperitoneal mass.  Likely cancer.  It could explain the pain he is having.  Patient is stable for admission at this time.  I will put in a palliative care consult.  There has been discussion about starting dialysis.  I think goals of care discussion will be helpful.  Consultation: - Consulted or discussed management/test interpretation w/ external professional: Cardiology service, they will see the patient. I discussed case with Dr. Branch.   Final Clinical Impression(s) / ED Diagnoses Final diagnoses:  Pleural effusion  Acute on chronic congestive heart failure, unspecified heart failure type (HCC)  Acute renal failure superimposed on chronic kidney disease, unspecified CKD stage, unspecified acute renal failure type (HCC)  Uremia    Rx / DC Orders ED Discharge Orders     None         Nanavati, Ankit, MD 12/13/21 1521  

## 2021-12-13 NOTE — Progress Notes (Signed)
Plan of Care Note for accepted transfer   Patient: Adam Reid MRN: [id number]   [place]: 12/13/2021  Facility requesting transfer: [facility].. Requesting Provider: Varney Biles MD. Reason for transfer: Cardiorenal syndrome. Facility course:  76 year old male with a past medical history of chronic diastolic CHF, BPH, anemia chronic disease, stage IV CKD, type II DM, depression, hypertension, history of GI bleed, paroxysmal atrial fibrillation, secondary hyper parathyroidism, thyroid disease, history of cigarette smoking who presented to the emergency department with urinary burning, dyspnea and progressively worse edema.  Work-up shows BNP of over 2000 pg/mL, worsening renal function and retroperitoneal mass.  The case was discussed with me by Dr. Kathrynn Humble.  The transfer was accepted to either [facility] facility.  He subsequently spoke to cardiology and they requested a cardiac telemetry bed at [facility].   Lab work:  Component Value Units  Brain natriuretic peptide (order ONLY if patient c/o SOB) [[id number]] (Abnormal)   Collected: 12/13/21 1011   Updated: 12/13/21 1128   Specimen Type: Blood   Specimen Source: Vein    B Natriuretic Peptide 2,005.0 High  pg/mL  Basic metabolic panel [[id number]] (Abnormal)   Collected: 12/13/21 1011   Updated: 12/13/21 1106   Specimen Type: Blood   Specimen Source: Vein    Sodium 137 mmol/L   Potassium 3.7 mmol/L   Chloride 100 mmol/L   CO2 21 Low  mmol/L   Glucose, Bld 290 High  mg/dL   BUN 106 High  mg/dL   Creatinine, Ser 4.75 High  mg/dL   Calcium 8.6 Low  mg/dL   GFR, Estimated 12 Low  mL/min   Anion gap 16 High   Magnesium [[id number]]   Collected: 12/13/21 1011   Updated: 12/13/21 1050   Specimen Type: Blood   Specimen Source: Vein    Magnesium 2.1 mg/dL  Urinalysis, Microscopic (reflex) [[id number]] (Abnormal)   Collected: 12/13/21 1026   Updated: 12/13/21 1040    RBC / HPF 0-5 RBC/hpf   WBC, UA 6-10 WBC/hpf   Bacteria, UA  FEW Abnormal    Squamous Epithelial / LPF 0-5   WBC Clumps PRESENT   Mucus PRESENT  Urinalysis, Routine w reflex microscopic Urine, Clean Catch [[id number]] (Abnormal)   Collected: 12/13/21 1026   Updated: 12/13/21 1040   Specimen Source: Urine, Clean Catch    Color, Urine STRAW Abnormal    APPearance CLEAR   Specific Gravity, Urine 1.010   pH 5.0   Glucose, UA 100 Abnormal  mg/dL   Hgb urine dipstick SMALL Abnormal    Bilirubin Urine NEGATIVE   Ketones, ur NEGATIVE mg/dL   Protein, ur 100 Abnormal  mg/dL   Nitrite NEGATIVE   Leukocytes,Ua NEGATIVE  CBC with Differential/Platelet [[id number]] (Abnormal)   Collected: 12/13/21 1011   Updated: 12/13/21 1027   Specimen Type: Blood   Specimen Source: Vein    WBC 12.4 High  K/uL   RBC 3.49 Low  MIL/uL   Hemoglobin 9.6 Low  g/dL   HCT 29.5 Low  %   MCV 84.5 fL   MCH 27.5 pg   MCHC 32.5 g/dL   RDW 18.7 High  %   Platelets 386 K/uL   nRBC 0.0 %   Neutrophils Relative % 91 %   Neutro Abs 11.3 High  K/uL   Lymphocytes Relative 3 %   Lymphs Abs 0.4 Low  K/uL   Monocytes Relative 4 %   Monocytes Absolute 0.5 K/uL   Eosinophils Relative 1 %   Eosinophils Absolute 0.1  K/uL   Basophils Relative 1 %   Basophils Absolute 0.1 K/uL   Immature Granulocytes 0 %   Abs Immature Granulocytes 0.05 K/uL   Plan of care: The patient is accepted for admission to Telemetry unit, at [facility]..   Author: Reubin Milan, MD 12/13/2021  Check [email or web address] for on-call coverage.  Nursing staff, Please call [employer] number on Amion as soon as patient's arrival, so appropriate admitting provider can evaluate the pt.

## 2021-12-13 NOTE — Plan of Care (Incomplete)
   76 year old male with a past medical history of chronic diastolic CHF, BPH, anemia chronic disease, stage IV CKD, type II DM, depression, hypertension, history of GI bleed, paroxysmal atrial fibrillation, secondary hyper parathyroidism, thyroid disease, history of cigarette smoking. He was recently admitted last month for a CHF exacerbation managed with IV lasix 80 mg BID. He's had recurrent admission for CHF exacerbation. He has normal EF , grade III DD. He has Stage IV CKD. He had prior PYP that was equivocal for TTR amyloid. Low suspicion. He's had myeloma panel done prior that was not suggestive of AL amyloid. He was seen by HF 11/30/2021. He's managed with torsemide 20 mg BID. Off farxiga 2/2 GFR 14. He returns with overall FTT. He has not been eating. He has retained urine 700 cc on his bladder scan. Which can contribute to his AKI. He is emaciated and suspect protein malnutrition  Physical Exam Gen: cachectic Neuro: alert and oriented CV: r,r,r no + holosystolic murmur LLSB radiating to the apex Vasc: 2+ radial pulses Pulm: nl wob, CLAB Abd: non distended Ext: 2+  LE pitting edema Skin: warm and well perfused Psych: normal mood

## 2021-12-13 NOTE — ED Notes (Signed)
Presents with wife with complaints of urinary burning and frequency/ urgency. VSS. Pt is on home oxygen, continued here

## 2021-12-14 ENCOUNTER — Encounter (HOSPITAL_COMMUNITY): Payer: Self-pay | Admitting: Internal Medicine

## 2021-12-14 DIAGNOSIS — Z7189 Other specified counseling: Secondary | ICD-10-CM | POA: Diagnosis not present

## 2021-12-14 DIAGNOSIS — N189 Chronic kidney disease, unspecified: Secondary | ICD-10-CM | POA: Diagnosis not present

## 2021-12-14 DIAGNOSIS — I509 Heart failure, unspecified: Secondary | ICD-10-CM

## 2021-12-14 DIAGNOSIS — R339 Retention of urine, unspecified: Secondary | ICD-10-CM

## 2021-12-14 DIAGNOSIS — I10 Essential (primary) hypertension: Secondary | ICD-10-CM

## 2021-12-14 DIAGNOSIS — N179 Acute kidney failure, unspecified: Secondary | ICD-10-CM

## 2021-12-14 DIAGNOSIS — D447 Neoplasm of uncertain behavior of aortic body and other paraganglia: Secondary | ICD-10-CM | POA: Diagnosis not present

## 2021-12-14 DIAGNOSIS — E119 Type 2 diabetes mellitus without complications: Secondary | ICD-10-CM

## 2021-12-14 DIAGNOSIS — D649 Anemia, unspecified: Secondary | ICD-10-CM

## 2021-12-14 DIAGNOSIS — I5031 Acute diastolic (congestive) heart failure: Secondary | ICD-10-CM | POA: Diagnosis not present

## 2021-12-14 DIAGNOSIS — I130 Hypertensive heart and chronic kidney disease with heart failure and stage 1 through stage 4 chronic kidney disease, or unspecified chronic kidney disease: Secondary | ICD-10-CM | POA: Diagnosis not present

## 2021-12-14 DIAGNOSIS — D72829 Elevated white blood cell count, unspecified: Secondary | ICD-10-CM

## 2021-12-14 LAB — BASIC METABOLIC PANEL
Anion gap: 12 (ref 5–15)
BUN: 104 mg/dL — ABNORMAL HIGH (ref 8–23)
CO2: 22 mmol/L (ref 22–32)
Calcium: 8.5 mg/dL — ABNORMAL LOW (ref 8.9–10.3)
Chloride: 104 mmol/L (ref 98–111)
Creatinine, Ser: 4.49 mg/dL — ABNORMAL HIGH (ref 0.61–1.24)
GFR, Estimated: 13 mL/min — ABNORMAL LOW (ref >60)
Glucose, Bld: 169 mg/dL — ABNORMAL HIGH (ref 70–99)
Potassium: 3.7 mmol/L (ref 3.5–5.1)
Sodium: 138 mmol/L (ref 135–145)

## 2021-12-14 LAB — GLUCOSE, CAPILLARY
Glucose-Capillary: 170 mg/dL — ABNORMAL HIGH (ref 70–99)
Glucose-Capillary: 173 mg/dL — ABNORMAL HIGH (ref 70–99)
Glucose-Capillary: 210 mg/dL — ABNORMAL HIGH (ref 70–99)
Glucose-Capillary: 259 mg/dL — ABNORMAL HIGH (ref 70–99)
Glucose-Capillary: 295 mg/dL — ABNORMAL HIGH (ref 70–99)
Glucose-Capillary: 34 mg/dL — CL (ref 70–99)
Glucose-Capillary: 35 mg/dL — CL (ref 70–99)

## 2021-12-14 LAB — LACTIC ACID, PLASMA
Lactic Acid, Venous: 1.3 mmol/L (ref 0.5–1.9)
Lactic Acid, Venous: 1.7 mmol/L (ref 0.5–1.9)

## 2021-12-14 MED ORDER — CHLORHEXIDINE GLUCONATE CLOTH 2 % EX PADS
6.0000 | MEDICATED_PAD | Freq: Every day | CUTANEOUS | Status: DC
  Administered 2021-12-14: 6 via TOPICAL

## 2021-12-14 MED ORDER — SODIUM CHLORIDE 0.9 % IV SOLN
1.0000 g | INTRAVENOUS | Status: AC
  Administered 2021-12-14 – 2021-12-16 (×3): 1 g via INTRAVENOUS
  Filled 2021-12-14 (×3): qty 10

## 2021-12-14 MED ORDER — DEXTROSE 50 % IV SOLN
INTRAVENOUS | Status: AC
  Administered 2021-12-14: 50 mL
  Filled 2021-12-14: qty 50

## 2021-12-14 MED ORDER — FUROSEMIDE 10 MG/ML IJ SOLN
80.0000 mg | Freq: Two times a day (BID) | INTRAMUSCULAR | Status: DC
  Administered 2021-12-14 – 2021-12-15 (×3): 80 mg via INTRAVENOUS
  Filled 2021-12-14 (×3): qty 8

## 2021-12-14 MED ORDER — METOLAZONE 2.5 MG PO TABS
2.5000 mg | ORAL_TABLET | Freq: Every day | ORAL | Status: DC
  Administered 2021-12-14 – 2021-12-15 (×2): 2.5 mg via ORAL
  Filled 2021-12-14: qty 1

## 2021-12-14 MED ORDER — INSULIN ASPART 100 UNIT/ML IJ SOLN
0.0000 [IU] | Freq: Three times a day (TID) | INTRAMUSCULAR | Status: DC
  Administered 2021-12-15: 4 [IU] via SUBCUTANEOUS
  Administered 2021-12-16 – 2021-12-17 (×2): 2 [IU] via SUBCUTANEOUS
  Administered 2021-12-17: 1 [IU] via SUBCUTANEOUS
  Administered 2021-12-18: 4 [IU] via SUBCUTANEOUS
  Administered 2021-12-18 – 2021-12-19 (×4): 1 [IU] via SUBCUTANEOUS
  Administered 2021-12-20: 2 [IU] via SUBCUTANEOUS
  Administered 2021-12-20 – 2021-12-21 (×3): 1 [IU] via SUBCUTANEOUS
  Administered 2021-12-21 – 2021-12-25 (×4): 2 [IU] via SUBCUTANEOUS
  Administered 2021-12-26: 1 [IU] via SUBCUTANEOUS
  Administered 2021-12-26 – 2021-12-27 (×2): 2 [IU] via SUBCUTANEOUS
  Administered 2021-12-28: 1 [IU] via SUBCUTANEOUS
  Administered 2021-12-28: 3 [IU] via SUBCUTANEOUS
  Administered 2021-12-29: 4 [IU] via SUBCUTANEOUS
  Administered 2021-12-30: 1 [IU] via SUBCUTANEOUS

## 2021-12-14 MED ORDER — LIDOCAINE HCL URETHRAL/MUCOSAL 2 % EX GEL
1.0000 "application " | Freq: Once | CUTANEOUS | Status: AC
  Administered 2021-12-14: 1 via URETHRAL
  Filled 2021-12-14: qty 6

## 2021-12-14 NOTE — Progress Notes (Signed)
Progress Note  Patient Name: Adam Reid Date of Encounter: 12/14/2021  [facility] Cardiologist: Elouise Munroe, MD transferred to heart failure  Subjective   He feels better this AM. Lying flat comfortable. No pain. We discussed that his heart function is likely not going to get better.   Inpatient Medications    Scheduled Meds:  amiodarone  200 mg Oral Daily   apixaban  2.5 mg Oral BID   atorvastatin  80 mg Oral Daily   calcium acetate  667 mg Oral TID with meals   Chlorhexidine Gluconate Cloth  6 each Topical Q0600   citalopram  20 mg Oral Daily   diltiazem  180 mg Oral Daily   finasteride  5 mg Oral Daily   furosemide  80 mg Intravenous BID   insulin aspart  0-9 Units Subcutaneous TID WC   linagliptin  5 mg Oral Daily   sodium bicarbonate  650 mg Oral BID   sodium chloride flush  3 mL Intravenous Q12H   tamsulosin  0.4 mg Oral Daily   Continuous Infusions:  sodium chloride     cefTRIAXone (ROCEPHIN)  IV 1 g (12/14/21 0658)   PRN Meds: sodium chloride, acetaminophen, hydrALAZINE, ondansetron (ZOFRAN) IV, polyethylene glycol, sodium chloride flush   Vital Signs    Vitals:   12/13/21 1530 12/13/21 1646 12/13/21 2133 12/14/21 0417  BP: (!) 123/58 (!) 153/69 139/66 (!) 152/66  Pulse: (!) 53 (!) 59 80 82  Resp: '12 16 18 16  '$ Temp:  (!) 97.4 F (36.3 C) 97.8 F (36.6 C) 97.6 F (36.4 C)  TempSrc:  Oral Oral Oral  SpO2: 98% 98% 100% 97%  Weight:  52.7 kg  52.1 kg  Height:  '5\' 6"'$  (1.676 m)      Intake/Output Summary (Last 24 hours) at 12/14/2021 0939 Last data filed at 12/14/2021 0410 Gross per 24 hour  Intake 435 ml  Output 1820 ml  Net -1385 ml      12/14/2021    4:17 AM 12/13/2021    4:46 PM 12/13/2021   10:04 AM  Last 3 Weights  Weight (lbs) 114 lb 13.8 oz 116 lb 2.9 oz 116 lb 10 oz  Weight (kg) 52.1 kg 52.7 kg 52.9 kg      Telemetry    NSR - Personally Reviewed  ECG    NA - Personally Reviewed  Physical Exam   Vitals:   12/13/21 2133  12/14/21 0417  BP: 139/66 (!) 152/66  Pulse: 80 82  Resp: 18 16  Temp: 97.8 F (36.6 C) 97.6 F (36.4 C)  SpO2: 100% 97%     GEN: No acute distress.   Neck: ++ JVD ~ 15 cm Cardiac: RRR, no murmurs, rubs, or gallops.  Respiratory: Clear to auscultation bilaterally. GI: Soft, nontender, non-distended  MS: No edema; No deformity. Neuro:  Nonfocal  Psych: Normal affect   Labs    High Sensitivity Troponin:  No results for input(s): TROPONINIHS in the last 720 hours.   Chemistry Recent Labs  Lab 12/10/21 1339 12/13/21 1011 12/14/21 0347  NA 141 137 138  K 3.7 3.7 3.7  CL 102 100 104  CO2 21 21* 22  GLUCOSE 98 290* 169*  BUN 110* 106* 104*  CREATININE 4.85* 4.75* 4.49*  CALCIUM 8.8 8.6* 8.5*  MG  --  2.1  --   GFRNONAA  --  12* 13*  ANIONGAP  --  16* 12    Lipids No results for input(s): CHOL, TRIG, HDL,  LABVLDL, LDLCALC, CHOLHDL in the last 168 hours.  Hematology Recent Labs  Lab 12/13/21 1011  WBC 12.4*  RBC 3.49*  HGB 9.6*  HCT 29.5*  MCV 84.5  MCH 27.5  MCHC 32.5  RDW 18.7*  PLT 386   Thyroid No results for input(s): TSH, FREET4 in the last 168 hours.  BNP Recent Labs  Lab 12/10/21 1339 12/13/21 1011  BNP  --  2,005.0*  PROBNP 12,645*  --     DDimer No results for input(s): DDIMER in the last 168 hours.   Radiology    CT ABDOMEN PELVIS WO CONTRAST  Result Date: 12/13/2021 CLINICAL DATA:  76 year old male with history of abnormal chest x-ray. Pneumonia. Pleural effusion. Suspected malignancy. EXAM: CT CHEST, ABDOMEN AND PELVIS WITHOUT CONTRAST TECHNIQUE: Multidetector CT imaging of the chest, abdomen and pelvis was performed following the standard protocol without IV contrast. RADIATION DOSE REDUCTION: This exam was performed according to the departmental dose-optimization program which includes automated exposure control, adjustment of the mA and/or kV according to patient size and/or use of iterative reconstruction technique. COMPARISON:  Chest CT  11/15/2021. CT the abdomen and pelvis 11/17/2021. FINDINGS: Comment: Today's study is severely limited for detection and characterization of visceral and/or vascular lesions by lack of IV contrast. CT CHEST FINDINGS Cardiovascular: Heart size is mildly enlarged. There is no significant pericardial fluid, thickening or pericardial calcification. There is aortic atherosclerosis, as well as atherosclerosis of the great vessels of the mediastinum and the coronary arteries, including calcified atherosclerotic plaque in the left main, left anterior descending, left circumflex and right coronary arteries. Mediastinum/Nodes: Multiple prominent borderline enlarged mediastinal lymph nodes are noted, nonspecific, but similar to the recent prior study, favored to be reactive. No definite pathologically enlarged mediastinal or hilar lymph nodes are confidently identified on today's noncontrast CT examination. Esophagus is unremarkable in appearance. No axillary lymphadenopathy. Lungs/Pleura: Moderate right chronic partially loculated pleural effusion and small left chronic partially loculated pleural effusion, both of which are stable compared to the prior study. Both of these are associated with extensive areas of pleuroparenchymal thickening and nodular/mass-like architectural distortion in the lungs bilaterally, with associated pleural tails", most compatible with extensive areas of rounded atelectasis. There continues to be some patchy multifocal ground-glass attenuation in the lungs bilaterally, some of which appears improved compared to the prior study, but other areas have clearly increased compared to the prior examination, with some associated septal thickening, most evident in the anterior aspect of the right upper lobe (best appreciated on axial image 58 of series 3). Musculoskeletal: There are no aggressive appearing lytic or blastic lesions noted in the visualized portions of the skeleton. CT ABDOMEN PELVIS FINDINGS  Hepatobiliary: No definite suspicious appearing hepatic lesions are confidently identified on today's noncontrast CT examination. Unenhanced appearance of the gallbladder is unremarkable. Pancreas: No definite pancreatic mass or peripancreatic fluid collections or inflammatory changes are confidently identified on today's noncontrast CT examination. Spleen: Unremarkable. Adrenals/Urinary Tract: Low-attenuation lesions in the upper pole of the right kidney measuring up to 2.9 x 2.3 cm, incompletely characterized on today's non-contrast CT examination, but statistically likely to represent cysts. Other subcentimeter high attenuation lesions are noted in the left kidney, incompletely characterized, but favored to represent proteinaceous/hemorrhagic cysts. Low-attenuation adreniform thickening bilaterally, favored to represent adenomatous hyperplasia. Large left retroperitoneal mass (discussed below) which is favored to be separate from the left adrenal gland, although this does make contact with the anterior aspect of the left adrenal gland such that an adrenal lesion is  not entirely excluded. No hydroureteronephrosis. Urinary bladder is markedly distended, but otherwise unremarkable in appearance on today's noncontrast CT examination. Stomach/Bowel: The appearance of the stomach is normal. No pathologic dilatation of small bowel or colon. The appendix is not confidently identified and may be surgically absent. Regardless, there are no inflammatory changes noted adjacent to the cecum to suggest the presence of an acute appendicitis at this time. Vascular/Lymphatic: Aortic atherosclerosis. Large left para-aortic retroperitoneal mass could represent malignant centrally necrotic lymphadenopathy, however, this is favored to represent a primary retroperitoneal lesion (see discussion below). No other lymphadenopathy noted elsewhere in the abdomen or pelvis. Reproductive: Prostate gland and seminal vesicles are unremarkable  in appearance. Other: Again noted is a large left-sided para-aortic retroperitoneal mass measuring approximately 6.9 x 6.1 x 7.9 cm (axial image 27 of series 2 and coronal image 38 of series 5), previously 6.7 x 5.4 x 6.9 cm). This is intimately associated with the adjacent structures, including the abdominal aorta left renal artery, proximal superior mesenteric artery, anterior aspect of the left adrenal gland, and multiple small bowel loops. The lesion appears separate from the pancreas. The lesion is heterogeneous in attenuation with some central areas that are lower attenuation (21 HU) and peripheral intermediate attenuation (38 HU). No significant volume of ascites. No pneumoperitoneum. Musculoskeletal: There are no aggressive appearing lytic or blastic lesions noted in the visualized portions of the skeleton. IMPRESSION: 1. 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 This may represent a primary retroperitoneal neoplasm, but differential considerations are very broad given the location, and include malignant lymphadenopathy, vascular lesions, lesions of adrenal origin, gastrointestinal lesion of the small bowel, etc. Further evaluation with abdominal MRI with and without IV gadolinium is recommended to better characterize this finding. 2. Shifting pattern of ground-glass attenuation and septal thickening in the lungs is of uncertain etiology and significance, but is overall slightly worsened and likely of infectious or inflammatory etiology. Infiltrative neoplasm such as metastatic adenocarcinoma is difficult to entirely exclude. 3. Chronic bilateral pleural effusions with extensive rounded atelectasis in both lungs, similar to the prior study. 4. Mild cardiomegaly. 5. Aortic atherosclerosis, in addition to left main and three-vessel coronary artery disease. Assessment for  potential risk factor modification, dietary therapy or pharmacologic therapy may be warranted, if clinically indicated. 6. Additional incidental findings, as above. Electronically Signed   By: Vinnie Langton M.D.   On: 12/13/2021 13:31   CT Chest Wo Contrast  Result Date: 12/13/2021 CLINICAL DATA:  76 year old male with history of abnormal chest x-ray. Pneumonia. Pleural effusion. Suspected malignancy. EXAM: CT CHEST, ABDOMEN AND PELVIS WITHOUT CONTRAST TECHNIQUE: Multidetector CT imaging of the chest, abdomen and pelvis was performed following the standard protocol without IV contrast. RADIATION DOSE REDUCTION: This exam was performed according to the departmental dose-optimization program which includes automated exposure control, adjustment of the mA and/or kV according to patient size and/or use of iterative reconstruction technique. COMPARISON:  Chest CT 11/15/2021. CT the abdomen and pelvis 11/17/2021. FINDINGS: Comment: Today's study is severely limited for detection and characterization of visceral and/or vascular lesions by lack of IV contrast. CT CHEST FINDINGS Cardiovascular: Heart size is mildly enlarged. There is no significant pericardial fluid, thickening or pericardial calcification. There is aortic atherosclerosis, as well as atherosclerosis of the great vessels of the mediastinum and the coronary arteries, including calcified atherosclerotic plaque in the left main, left anterior descending, left circumflex  and right coronary arteries. Mediastinum/Nodes: Multiple prominent borderline enlarged mediastinal lymph nodes are noted, nonspecific, but similar to the recent prior study, favored to be reactive. No definite pathologically enlarged mediastinal or hilar lymph nodes are confidently identified on today's noncontrast CT examination. Esophagus is unremarkable in appearance. No axillary lymphadenopathy. Lungs/Pleura: Moderate right chronic partially loculated pleural effusion and small left  chronic partially loculated pleural effusion, both of which are stable compared to the prior study. Both of these are associated with extensive areas of pleuroparenchymal thickening and nodular/mass-like architectural distortion in the lungs bilaterally, with associated pleural tails", most compatible with extensive areas of rounded atelectasis. There continues to be some patchy multifocal ground-glass attenuation in the lungs bilaterally, some of which appears improved compared to the prior study, but other areas have clearly increased compared to the prior examination, with some associated septal thickening, most evident in the anterior aspect of the right upper lobe (best appreciated on axial image 58 of series 3). Musculoskeletal: There are no aggressive appearing lytic or blastic lesions noted in the visualized portions of the skeleton. CT ABDOMEN PELVIS FINDINGS Hepatobiliary: No definite suspicious appearing hepatic lesions are confidently identified on today's noncontrast CT examination. Unenhanced appearance of the gallbladder is unremarkable. Pancreas: No definite pancreatic mass or peripancreatic fluid collections or inflammatory changes are confidently identified on today's noncontrast CT examination. Spleen: Unremarkable. Adrenals/Urinary Tract: Low-attenuation lesions in the upper pole of the right kidney measuring up to 2.9 x 2.3 cm, incompletely characterized on today's non-contrast CT examination, but statistically likely to represent cysts. Other subcentimeter high attenuation lesions are noted in the left kidney, incompletely characterized, but favored to represent proteinaceous/hemorrhagic cysts. Low-attenuation adreniform thickening bilaterally, favored to represent adenomatous hyperplasia. Large left retroperitoneal mass (discussed below) which is favored to be separate from the left adrenal gland, although this does make contact with the anterior aspect of the left adrenal gland such that an  adrenal lesion is not entirely excluded. No hydroureteronephrosis. Urinary bladder is markedly distended, but otherwise unremarkable in appearance on today's noncontrast CT examination. Stomach/Bowel: The appearance of the stomach is normal. No pathologic dilatation of small bowel or colon. The appendix is not confidently identified and may be surgically absent. Regardless, there are no inflammatory changes noted adjacent to the cecum to suggest the presence of an acute appendicitis at this time. Vascular/Lymphatic: Aortic atherosclerosis. Large left para-aortic retroperitoneal mass could represent malignant centrally necrotic lymphadenopathy, however, this is favored to represent a primary retroperitoneal lesion (see discussion below). No other lymphadenopathy noted elsewhere in the abdomen or pelvis. Reproductive: Prostate gland and seminal vesicles are unremarkable in appearance. Other: Again noted is a large left-sided para-aortic retroperitoneal mass measuring approximately 6.9 x 6.1 x 7.9 cm (axial image 27 of series 2 and coronal image 38 of series 5), previously 6.7 x 5.4 x 6.9 cm). This is intimately associated with the adjacent structures, including the abdominal aorta left renal artery, proximal superior mesenteric artery, anterior aspect of the left adrenal gland, and multiple small bowel loops. The lesion appears separate from the pancreas. The lesion is heterogeneous in attenuation with some central areas that are lower attenuation (21 HU) and peripheral intermediate attenuation (38 HU). No significant volume of ascites. No pneumoperitoneum. Musculoskeletal: There are no aggressive appearing lytic or blastic lesions noted in the visualized portions of the skeleton. IMPRESSION: 1. 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 This  may represent a primary retroperitoneal  neoplasm, but differential considerations are very broad given the location, and include malignant lymphadenopathy, vascular lesions, lesions of adrenal origin, gastrointestinal lesion of the small bowel, etc. Further evaluation with abdominal MRI with and without IV gadolinium is recommended to better characterize this finding. 2. Shifting pattern of ground-glass attenuation and septal thickening in the lungs is of uncertain etiology and significance, but is overall slightly worsened and likely of infectious or inflammatory etiology. Infiltrative neoplasm such as metastatic adenocarcinoma is difficult to entirely exclude. 3. Chronic bilateral pleural effusions with extensive rounded atelectasis in both lungs, similar to the prior study. 4. Mild cardiomegaly. 5. Aortic atherosclerosis, in addition to left main and three-vessel coronary artery disease. Assessment for potential risk factor modification, dietary therapy or pharmacologic therapy may be warranted, if clinically indicated. 6. Additional incidental findings, as above. Electronically Signed   By: Vinnie Langton M.D.   On: 12/13/2021 13:31   DG Chest Portable 1 View  Result Date: 12/13/2021 CLINICAL DATA:  Shortness of breath.  Renal failure. EXAM: PORTABLE CHEST 1 VIEW COMPARISON:  12/10/2021 radiograph And prior studies FINDINGS: Cardiomediastinal silhouette is unchanged. Pulmonary vascular congestion again noted. Moderate RIGHT pleural effusion and small to moderate LEFT pleural effusion are again noted. Unchanged bilateral pulmonary opacities/scarring again identified. Nodular opacity overlying the RIGHT UPPER lung appears slightly more prominent which is likely technical. There is no evidence of pneumothorax or acute bony abnormality. IMPRESSION: Little significant change in appearance of the chest with pulmonary vascular congestion, bilateral pleural effusions and unchanged bilateral pulmonary opacities. Electronically Signed   By: Margarette Canada  M.D.   On: 12/13/2021 10:49    Cardiac Studies   TTE 12/13/2021  1. Left ventricular ejection fraction, by estimation, is 55 to 60%. The  left ventricle has normal function. The left ventricle has no regional  wall motion abnormalities. There is mild left ventricular hypertrophy of  the basal-septal segment. Left  ventricular diastolic parameters are consistent with Grade III diastolic  dysfunction (restrictive). Elevated left atrial pressure.   2. Right ventricular systolic function is normal. The right ventricular  size is mildly enlarged. There is mildly elevated pulmonary artery  systolic pressure.   3. Left atrial size was moderately dilated.   4. 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tion.  No evidence of mitral stenosis.   5. The aortic valve is tricuspid. Aortic valve regurgitation is not  visualized. Aortic valve sclerosis is present, with no evidence of aortic  valve stenosis.   6. The inferior vena cava is dilated in size with >50% respiratory  variability, suggesting right atrial pressure of 8 mmHg.  Patient Profile     76 year old male with a past medical history of chronic diastolic CHF, BPH, anemia chronic disease, stage IV CKD, type II DM, depression, hypertension, history of GI bleed, paroxysmal atrial fibrillation, secondary hyper parathyroidism, thyroid disease, history of cigarette smoking here with recurrent CHF exacerbation  Assessment & Plan     Acute on chronic diastolic CHF: NYHA III-IV  He has normal EF , grade III DD, moderate MR. He has Stage IV CKD. He had prior PYP that was equivocal for TTR amyloid. Low suspicion. He's had myeloma panel done prior that was not suggestive of AL amyloid. He was seen by HF 11/30/2021. He's managed with torsemide 20 mg BID. Off farxiga 2/2 GFR 14. He returns with overall FTT and decompensation. He had retained urine 700 cc contributing to his AKI. He does have  elevated JVD and BNP  up from prior  1516->2005. If renal function does not improved with diuresis may need discussion with nephrology regarding whether IHD is reasonable; agree with palliative. Will engage heart failure if he is not improving as well. Blood pressures are ok, he is not very cool.  - crt 5.85->4.75->4.49 - weight is low; Stage C may be nearing stage D - lactate - continue lasix 80 mg IV BID , will add low dose metolazone goal closer to 2L net negative - consider nephrology consult if renal fxn not improving; will also engage heart failure if this is the case as well. Palliative is on board.  Paroxysmal Atrial Fibrillation/Flutter  Maintaining sinus rhythm. - Continue Amiodarone '200mg'$  daily. - Continue Eliquis 2.'5mg'$  twice daily. Reduced dose due to renal function and weight.  Hyperlipidemia - Continue home Lipitor '80mg'$  daily.   CKD Stage IV Creatinine 4.75 on admission. Baseline around 3.8. - Would recommend consulting Nephrology.   Otherwise, per primary team: - Left retroperitoneal mass concerning for malignancy - Dysuria  - Type 2 diabetes  - Chronic anemia  For questions or updates, please contact [facility] Please consult [email or web address] for contact info under        Signed, Janina Mayo, MD  12/14/2021, 9:39 AM

## 2021-12-14 NOTE — Progress Notes (Signed)
Patient ID: Elford Evilsizer, male   DOB: Apr 24, 1946, 76 y.o.   MRN: [id number]    Progress Note from the Palliative Medicine Team at [facility]   Patient Name: Adam Reid        Date: 12/14/2021 DOB: [birth year]  Age: 76 y.o. MRN#: [id number] Attending Physician: Patrecia Pour, MD Primary Care Physician: [name], MD Admit Date: 12/13/2021   Medical records reviewed ,  discussed case with attending  Per intake H&P -->  76 year old male with a past medical history of chronic diastolic CHF, BPH, anemia chronic disease, stage IV CKD, type II DM, depression, hypertension, history of GI bleed, paroxysmal atrial fibrillation, secondary hyper parathyroidism, thyroid disease, history of cigarette smoking who presented to the emergency department with urinary burning, dyspnea and progressively worse edema.  Work-up shows BNP of over 2000 pg/mL, worsening renal function and retroperitoneal mass.  The case was discussed with me by Dr. Kathrynn Humble.  The transfer was accepted to either [facility] facility.  He subsequently spoke to cardiology and they requested a cardiac telemetry bed at [facility].   Palliative care asked to get involved in the setting of worsening kidney function and large left retroperitoneal paraglioma.    This NP visited patient at the bedside as a follow up to  yesterday's [place] and to assess for palliative medicine needs and emotional support.  I introduced myself and the role of palliative medicine team patient with the support of a Farsi interpreter/computer.  Attempted to explore patient's understanding of his current medical situation however he had little to say regarding his medical situation.  I offered education regarding his multiple chronic diseases, and contemplation regarding treatment options, advanced directive decisions and anticipatory care.  He does tell me that he would like to know all the information regarding medical situation and hopes to continue the  conversation tomorrow morning with his wife when she comes to the bedside for scheduled family meeting.  I spoke to patient's wife by phone and she agrees to meet me in the morning for ongoing goals of care discussion. Meeting is scheduled for tomorrow morning at 9:00   Education offered today regarding  the importance of continued conversation with family and their  medical providers regarding overall plan of care and treatment options,  ensuring decisions are within the context of the patients values and GOCs.  Questions and concerns addressed   Discussed with Dr Gerri Spore NP  Palliative Medicine Team Team Phone # [phone] Pager [phone]

## 2021-12-14 NOTE — Progress Notes (Signed)
Heart Failure Navigator Progress Note  Assessed for Heart & Vascular TOC clinic readiness.  Patient does not meet criteria due to patient with the Advance Heart Failure Team.    Earnestine Leys, BSN, RN Heart Failure Nurse Navigator Secure Chat Only

## 2021-12-14 NOTE — Progress Notes (Signed)
Pt complains of on and off pain/burning sensation in the genitalia area when feeling an urge/need to urinate.  Pt currently has an indwelling catheter in place.   RN spoke with patient using the interpreter at bedside.  MD aware.

## 2021-12-14 NOTE — Consult Note (Addendum)
[facility]  Telephone:([phone]) [phone] Fax:([phone]   MEDICAL ONCOLOGY - INITIAL CONSULTATION  Referral MD: Dr. 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  He was recently found to have a retroperitoneal mass and presented with difficulty urinating, abdominal distention, and leg swelling.  He had a 4 to 5-day history of difficulty urinating with weak stream and feeling of incomplete emptying.  He developed bilateral ankle swelling and cardiology had increased diuresis but he had no significant change in his symptoms.  On admission, his blood pressure was significantly elevated, creatinine 4.7 (baseline 3.8).  He had a CT of the chest, abdomen, and pelvis without contrast which showed enlargement of an aggressive appearing presumably malignant lesion in the left retroperitoneum measuring 6.9 x 6.1 x 7.9 cm.    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  He was referred to medical oncology as an outpatient and his chart was reviewed by our office.  Our office recommended referral to general surgery and if deemed to be unresectable, the patient would need to be referred to endocrinology.  The patient's primary care provider has placed referrals to both general surgery and endocrinology but he has not yet been seen in their office.  The patient is resting quietly in bed today.  He reports lack of appetite and weight loss.  He has some nausea and vomiting but no abdominal pain.  He is not having any chest pain, shortness of breath, cough.  Has ongoing lower extremity edema but overall improved.  The patient is married.  He is originally from [place].  He has 1 son.  Denies  history of alcohol use.  Quit smoking about 2 years ago.  Medical oncology was asked see the patient to make recommendations regarding his paraganglioma.   Past Medical History:  Diagnosis Date   Anemia    CHF (congestive heart failure) (HCC)    Chronic kidney disease    Depression    Diabetes mellitus without complication (HCC)    Type II   GERD (gastroesophageal reflux disease)    History of blood transfusion    Hyperlipidemia    Hypertension    Thyroid disease    was on supplement, taken off by [name]  :   Past Surgical History:  Procedure Laterality Date   APPENDECTOMY     AV FISTULA PLACEMENT Right 09/04/2021   Procedure: RIGHT BRACHIOCEPHALIC ARTERIOVENOUS (AV) FISTULA CREATION;  Surgeon: [name], MD;  Location: [facility];  Service: Vascular;  Laterality: Right;   BIOPSY  12/25/2020   Procedure: BIOPSY;  Surgeon: [name]., MD;  Location: [facility];  Service: Gastroenterology;;   CARDIAC CATHETERIZATION N/A 01/30/2016   Procedure: Left Heart Cath and Coronary Angiography;  Surgeon: [name], MD;  Location: [facility];  Service: Cardiovascular;  Laterality: N/A;   ENTEROSCOPY N/A 12/25/2020   Procedure: ENTEROSCOPY;  Surgeon: [name], MD;  Location: [facility];  Service: Gastroenterology;  Laterality: N/A;   GIVENS CAPSULE STUDY N/A 12/25/2020   Procedure: GIVENS CAPSULE STUDY;  Surgeon: [name]., MD;  Location: [facility];  Service: Gastroenterology;  Laterality: N/A;   SUBMUCOSAL TATTOO INJECTION  12/25/2020   Procedure: SUBMUCOSAL TATTOO INJECTION;  Surgeon: [name]., MD;  Location: [facility];  Service: Gastroenterology;;  :   Current Facility-Administered Medications  Medication Dose Route Frequency Provider Last Rate Last Admin   0.9 %  sodium chloride infusion  250 mL Intravenous PRN Wynetta Fines T, MD       acetaminophen (TYLENOL) tablet 650 mg  650 mg Oral Q4H PRN Wynetta Fines T, MD       amiodarone (PACERONE) tablet 200 mg  200 mg Oral Daily Wynetta Fines T, MD   200 mg at 12/14/21 4503   apixaban (ELIQUIS) tablet 2.5 mg  2.5 mg Oral BID Wynetta Fines T, MD   2.5 mg at 12/14/21 0837   atorvastatin (LIPITOR) tablet 80 mg  80 mg Oral Daily Wynetta Fines T, MD   80 mg at 12/14/21 8882   calcium acetate (PHOSLO) capsule 667 mg  667 mg Oral TID with meals Wynetta Fines T, MD   667 mg at 12/14/21 0838   cefTRIAXone (ROCEPHIN) 1 g in sodium chloride 0.9 % 100 mL IVPB  1 g Intravenous Q24H Vance Gather B, MD 200 mL/hr at 12/14/21 0658 1 g at 12/14/21 0658   Chlorhexidine Gluconate Cloth 2 % PADS 6 each  6 each Topical Q0600 Lequita Halt, MD   6 each at 12/14/21 0510   citalopram (CELEXA) tablet 20 mg  20 mg Oral Daily Wynetta Fines T, MD   20 mg at 12/14/21 0839   diltiazem (CARDIZEM CD) 24 hr capsule 180 mg  180 mg Oral Daily Wynetta Fines T, MD   180 mg at 12/14/21 8003   finasteride (PROSCAR) tablet 5 mg  5 mg Oral Daily Wynetta Fines T, MD   5 mg at 12/14/21 0839   furosemide (LASIX) injection 80 mg  80 mg Intravenous BID Patrecia Pour, MD   80 mg at 12/14/21 4917   hydrALAZINE (APRESOLINE) tablet 25 mg  25 mg Oral Q6H PRN Wynetta Fines T, MD       insulin aspart (novoLOG) injection 0-9 Units  0-9 Units Subcutaneous TID WC Lequita Halt, MD   5 Units at 12/14/21 1211   linagliptin (TRADJENTA) tablet 5 mg  5 mg Oral Daily Wynetta Fines T, MD   5 mg at 12/14/21 0841   metolazone (ZAROXOLYN) tablet 2.5 mg  2.5 mg Oral Daily Janina Mayo, MD   2.5 mg at 12/14/21 1211   ondansetron (ZOFRAN) injection 4 mg  4 mg Intravenous Q6H PRN Wynetta Fines T, MD       polyethylene glycol (MIRALAX / GLYCOLAX) packet 17 g  17 g Oral Daily PRN Wynetta Fines T, MD       sodium bicarbonate tablet 650 mg  650 mg Oral BID Wynetta Fines T, MD   650 mg at 12/14/21 9150   sodium chloride flush (NS) 0.9 % injection 3 mL  3 mL Intravenous Q12H Wynetta Fines T, MD   3 mL at 12/14/21 0841   sodium chloride flush (NS) 0.9  % injection 3 mL  3 mL Intravenous PRN Wynetta Fines T, MD       tamsulosin [facility]) capsule 0.4 mg  0.4 mg Oral Daily Wynetta Fines T, MD   0.4 mg at 12/14/21 5697      Allergies  Allergen Reactions   Pork-Derived Products Other (See Comments)    Patient "just does not eat this"  :   Family History  Problem Relation Age of Onset   Hypertension Mother    Hyperlipidemia Mother    Hypertension  Sister    Kidney disease Father    Heart disease Brother 83       open heart surgery   Colon cancer Neg Hx   :   Social History   Socioeconomic History   Marital status: Married    Spouse name: [name]   Number of children: 1   Years of education: 12th grade   Highest education level: Not on file  Occupational History   Occupation: Retired-accountant  Tobacco Use   Smoking status: Former    Packs/day: 0.10    Years: 48.00    Pack years: 4.80    Types: Cigarettes    Quit date: 07/16/2019    Years since quitting: 2.4   Smokeless tobacco: Never   Tobacco comments:    09/03/22 Wife states, "he hides from me, so I am not sure how much he smokes."  Vaping Use   Vaping Use: Never used  Substance and Sexual Activity   Alcohol use: No    Alcohol/week: 0.0 standard drinks   Drug use: No   Sexual activity: Never    Partners: Female  Other Topics Concern   Not on file  Social History Narrative   Originally from [place]. Came to the [place] in 2009, following their son who came here for school.   Married.   Lives with his wife.   Their adult son lives in [place], [place], where he is in optometry school.   Education: [employer]   Exercise: No   Social Determinants of Radio broadcast assistant Strain: Not on file  Food Insecurity: Not on file  Transportation Needs: Not on file  Physical Activity: Not on file  Stress: Not on file  Social Connections: Not on file  Intimate Partner Violence: Not on file  :  Review of Systems: A comprehensive 14 point review of systems was negative except  as noted in the HPI.  Exam: Patient Vitals for the past 24 hrs:  BP Temp Temp src Pulse Resp SpO2 Height Weight  12/14/21 0417 (!) 152/66 97.6 F (36.4 C) Oral 82 16 97 % -- 52.1 kg  12/13/21 2133 139/66 97.8 F (36.6 C) Oral 80 18 100 % -- --  12/13/21 1646 (!) 153/69 (!) 97.4 F (36.3 C) Oral (!) 59 16 98 % '5\' 6"'$  (1.676 m) 52.7 kg  12/13/21 1530 (!) 123/58 -- -- (!) 53 12 98 % -- --  12/13/21 1500 126/76 -- -- 62 (!) 24 95 % -- --    General: Frail appearing, no distress.   Eyes:  no scleral icterus.   ENT:  There were no oropharyngeal lesions.   Lymphatics:  Negative cervical, supraclavicular or axillary adenopathy.   Respiratory: lungs were clear bilaterally without wheezing or crackles.   Cardiovascular:  Regular rate and rhythm, S1/S2, rub or gallop.  There was no pedal edema.   GI: Positive bowel sounds,, nontender. Skin exam was without echymosis, petichae.   Neuro exam was nonfocal. Patient was alert and oriented.       Lab Results  Component Value Date   WBC 12.4 (H) 12/13/2021   HGB 9.6 (L) 12/13/2021   HCT 29.5 (L) 12/13/2021   PLT 386 12/13/2021   GLUCOSE 169 (H) 12/14/2021   CHOL 147 12/22/2020   TRIG 142.0 12/22/2020   HDL 70.70 12/22/2020   LDLCALC 48 12/22/2020   ALT 48 (H) 11/12/2021   AST 24 11/12/2021   NA 138 12/14/2021   K 3.7 12/14/2021   CL 104  12/14/2021   CREATININE 4.49 (H) 12/14/2021   BUN 104 (H) 12/14/2021   CO2 22 12/14/2021    CT ABDOMEN PELVIS WO CONTRAST  Result Date: 12/13/2021 CLINICAL DATA:  76 year old male with history of abnormal chest x-ray. Pneumonia. Pleural effusion. Suspected malignancy. EXAM: CT CHEST, ABDOMEN AND PELVIS WITHOUT CONTRAST TECHNIQUE: Multidetector CT imaging of the chest, abdomen and pelvis was performed following the standard protocol without IV contrast. RADIATION DOSE REDUCTION: This exam was performed according to the departmental dose-optimization program which includes automated exposure control,  adjustment of the mA and/or kV according to patient size and/or use of iterative reconstruction technique. COMPARISON:  Chest CT 11/15/2021. CT the abdomen and pelvis 11/17/2021. FINDINGS: Comment: Today's study is severely limited for detection and characterization of visceral and/or vascular lesions by lack of IV contrast. CT CHEST FINDINGS Cardiovascular: Heart size is mildly enlarged. There is no significant pericardial fluid, thickening or pericardial calcification. There is aortic atherosclerosis, as well as atherosclerosis of the great vessels of the mediastinum and the coronary arteries, including calcified atherosclerotic plaque in the left main, left anterior descending, left circumflex and right coronary arteries. Mediastinum/Nodes: Multiple prominent borderline enlarged mediastinal lymph nodes are noted, nonspecific, but similar to the recent prior study, favored to be reactive. No definite pathologically enlarged mediastinal or hilar lymph nodes are confidently identified on today's noncontrast CT examination. Esophagus is unremarkable in appearance. No axillary lymphadenopathy. Lungs/Pleura: Moderate right chronic partially loculated pleural effusion and small left chronic partially loculated pleural effusion, both of which are stable compared to the prior study. Both of these are associated with extensive areas of pleuroparenchymal thickening and nodular/mass-like architectural distortion in the lungs bilaterally, with associated pleural tails", most compatible with extensive areas of rounded atelectasis. There continues to be some patchy multifocal ground-glass attenuation in the lungs bilaterally, some of which appears improved compared to the prior study, but other areas have clearly increased compared to the prior examination, with some associated septal thickening, most evident in the anterior aspect of the right upper lobe (best appreciated on axial image 58 of series 3). Musculoskeletal: There  are no aggressive appearing lytic or blastic lesions noted in the visualized portions of the skeleton. CT ABDOMEN PELVIS FINDINGS Hepatobiliary: No definite suspicious appearing hepatic lesions are confidently identified on today's noncontrast CT examination. Unenhanced appearance of the gallbladder is unremarkable. Pancreas: No definite pancreatic mass or peripancreatic fluid collections or inflammatory changes are confidently identified on today's noncontrast CT examination. Spleen: Unremarkable. Adrenals/Urinary Tract: Low-attenuation lesions in the upper pole of the right kidney measuring up to 2.9 x 2.3 cm, incompletely characterized on today's non-contrast CT examination, but statistically likely to represent cysts. Other subcentimeter high attenuation lesions are noted in the left kidney, incompletely characterized, but favored to represent proteinaceous/hemorrhagic cysts. Low-attenuation adreniform thickening bilaterally, favored to represent adenomatous hyperplasia. Large left retroperitoneal mass (discussed below) which is favored to be separate from the left adrenal gland, although this does make contact with the anterior aspect of the left adrenal gland such that an adrenal lesion is not entirely excluded. No hydroureteronephrosis. Urinary bladder is markedly distended, but otherwise unremarkable in appearance on today's noncontrast CT examination. Stomach/Bowel: The appearance of the stomach is normal. No pathologic dilatation of small bowel or colon. The appendix is not confidently identified and may be surgically absent. Regardless, there are no inflammatory changes noted adjacent to the cecum to suggest the presence of an acute appendicitis at this time. Vascular/Lymphatic: Aortic atherosclerosis. Large left para-aortic retroperitoneal mass could  represent malignant centrally necrotic lymphadenopathy, however, this is favored to represent a primary retroperitoneal lesion (see discussion below). No  other lymphadenopathy noted elsewhere in the abdomen or pelvis. Reproductive: Prostate gland and seminal vesicles are unremarkable in appearance. Other: Again noted is a large left-sided para-aortic retroperitoneal mass measuring approximately 6.9 x 6.1 x 7.9 cm (axial image 27 of series 2 and coronal image 38 of series 5), previously 6.7 x 5.4 x 6.9 cm). This is intimately associated with the adjacent structures, including the abdominal aorta left renal artery, proximal superior mesenteric artery, anterior aspect of the left adrenal gland, and multiple small bowel loops. The lesion appears separate from the pancreas. The lesion is heterogeneous in attenuation with some central areas that are lower attenuation (21 HU) and peripheral intermediate attenuation (38 HU). No significant volume of ascites. No pneumoperitoneum. Musculoskeletal: There are no aggressive appearing lytic or blastic lesions noted in the visualized portions of the skeleton. IMPRESSION: 1. 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 This may represent a primary retroperitoneal neoplasm, but differential considerations are very broad given the location, and include malignant lymphadenopathy, vascular lesions, lesions of adrenal origin, gastrointestinal lesion of the small bowel, etc. Further evaluation with abdominal MRI with and without IV gadolinium is recommended to better characterize this finding. 2. Shifting pattern of ground-glass attenuation and septal thickening in the lungs is of uncertain etiology and significance, but is overall slightly worsened and likely of infectious or inflammatory etiology. Infiltrative neoplasm such as metastatic adenocarcinoma is difficult to entirely exclude. 3. Chronic bilateral pleural effusions with extensive rounded atelectasis in both lungs, similar to the prior study. 4.  Mild cardiomegaly. 5. Aortic atherosclerosis, in addition to left main and three-vessel coronary artery disease. Assessment for potential risk factor modification, dietary therapy or pharmacologic therapy may be warranted, if clinically indicated. 6. Additional incidental findings, as above. Electronically Signed   By: Vinnie Langton M.D.   On: 12/13/2021 13:31   CT ABDOMEN PELVIS WO CONTRAST  Result Date: 11/17/2021 CLINICAL DATA:  Mass lesion seen in the upper abdomen in the previous CT chest done earlier. EXAM: CT ABDOMEN AND PELVIS WITHOUT CONTRAST TECHNIQUE: Multidetector CT imaging of the abdomen and pelvis was performed following the standard protocol without IV contrast. RADIATION DOSE REDUCTION: This exam was performed according to the departmental dose-optimization program which includes automated exposure control, adjustment of the mA and/or kV according to patient size and/or use of iterative reconstruction technique. COMPARISON:  CT chest done on 11/15/2021 FINDINGS: Lower chest: Small left pleural effusion is seen. Moderate right pleural effusion is noted. There are patchy infiltrates in the visualized lower lung fields, more so on the right side suggesting atelectasis/pneumonia. Coronary artery calcifications are seen. Heart is enlarged in size. Hepatobiliary: No focal abnormality is seen in the liver. There is no dilation of bile ducts. Gallbladder is not distended. Pancreas: No focal abnormality is seen. Spleen: Unremarkable. Adrenals/Urinary Tract: There is 2 cm low-density nodule in the left adrenal. Density measurements are less than 3 Hounsfield units. There is hyperplasia of right adrenal without discrete nodules. There is no hydronephrosis. There is 2.6 cm fluid density lesion in the upper pole of right kidney, possibly a cyst. There are smaller exophytic lesions in the lateral margin of left kidney. In the image 34 of series 3, there is 6 mm exophytic nodule with density measurements  higher than usual for  simple cyst. There are no renal or ureteral stones. Urinary bladder is not distended. Stomach/Bowel: Stomach is unremarkable. Small bowel loops are not dilated. Appendix is not seen. Moderate to large amount of stool is seen in the colon. There is no significant wall thickening in colon. There is no pericolic stranding. Scattered diverticula are seen in colon without signs of focal diverticulitis. Vascular/Lymphatic: Extensive arterial calcifications are seen. There is 6.7 x 5.4 cm mass lesion inseparable from the left margin of abdominal aorta at the level of left kidney. There is low-attenuation in the central portion of the lesion. There is no significant inflammatory stranding in the margin of this mass. Evaluation of the mass is limited in this noncontrast CT. Reproductive: There is marked enlargement of prostate projecting into the base of the bladder. Other: There is no pneumoperitoneum. There is no ascites. Bilateral inguinal hernias containing fat are noted. Musculoskeletal: Unremarkable. IMPRESSION: There is 6.7 x 5.4 cm mass lesion in the retroperitoneum in the left para-aortic location at the level of the left kidney. There is low-density in the central portion of the lesion. Findings suggest possible pathological lymphadenopathy with central necrosis or soft tissue malignancy. Without intravenous contrast, possibility of the lesion arising from or infiltrating left lateral wall of abdominal aorta is not excluded. Contrast enhanced CT or MRI should be considered for further characterization. No other significant lymphadenopathy is noted. There is no evidence of intestinal obstruction or pneumoperitoneum. There is no hydronephrosis. Markedly enlarged prostate projecting into the bladder. Bilateral pleural effusions, more so on the right side. Infiltrates are noted in the visualized lower lung fields, more so on the right side suggesting atelectasis/pneumonia. Coronary artery  calcifications are seen. Other findings as described in the body of the report. Electronically Signed   By: Elmer Picker M.D.   On: 11/17/2021 18:53   DG Chest 2 View  Result Date: 12/10/2021 CLINICAL DATA:  Pleural effusions, weight gain pain, edema, CHF EXAM: CHEST - 2 VIEW COMPARISON:  11/19/2021 FINDINGS: Upper normal heart size. Atherosclerotic calcification and mild tortuosity of thoracic aorta. Pulmonary vascularity normal. Extensive scarring in the mid lungs bilaterally with BILATERAL pleural effusions, larger on RIGHT. Bibasilar infiltrates and atelectasis. Nodular focus versus atelectasis in the RIGHT upper lobe again identified. Posterior opacity in the mid to lower chest on lateral view unchanged. No pneumothorax. IMPRESSION: Persistent BILATERAL pleural effusions larger on RIGHT. BILATERAL pulmonary scarring with persistent bibasilar infiltrates and atelectasis. Persistent nodular density or atelectasis at lateral RIGHT upper lobe. Electronically Signed   By: Lavonia Dana M.D.   On: 12/10/2021 14:17   DG Chest 2 View  Result Date: 11/19/2021 CLINICAL DATA:  Pleural effusions EXAM: CHEST - 2 VIEW COMPARISON:  Previous studies including the chest radiograph done on 11/12/2021 and CT chest done on 11/15/2021 FINDINGS: Transverse diameter of heart is increased. Small to moderate bilateral pleural effusions are seen, more so on the right side. Part of the effusions appear to be loculated in the periphery of mid lung fields. There is interval increase in patchy infiltrates in the right parahilar region and left lower lung fields. Are linear densities in the lateral aspect of left mid lung fields suggesting possible scarring. There is no pneumothorax. IMPRESSION: There is interval increase in patchy infiltrates in right parahilar region and left lower lung fields suggesting worsening atelectasis/pneumonia. Small to moderate bilateral pleural effusions, more so on the right side. Electronically  Signed   By: Elmer Picker M.D.   On: 11/19/2021 08:13  CT Chest Wo Contrast  Result Date: 12/13/2021 CLINICAL DATA:  76 year old male with history of abnormal chest x-ray. Pneumonia. Pleural effusion. Suspected malignancy. EXAM: CT CHEST, ABDOMEN AND PELVIS WITHOUT CONTRAST TECHNIQUE: Multidetector CT imaging of the chest, abdomen and pelvis was performed following the standard protocol without IV contrast. RADIATION DOSE REDUCTION: This exam was performed according to the departmental dose-optimization program which includes automated exposure control, adjustment of the mA and/or kV according to patient size and/or use of iterative reconstruction technique. COMPARISON:  Chest CT 11/15/2021. CT the abdomen and pelvis 11/17/2021. FINDINGS: Comment: Today's study is severely limited for detection and characterization of visceral and/or vascular lesions by lack of IV contrast. CT CHEST FINDINGS Cardiovascular: Heart size is mildly enlarged. There is no significant pericardial fluid, thickening or pericardial calcification. There is aortic atherosclerosis, as well as atherosclerosis of the great vessels of the mediastinum and the coronary arteries, including calcified atherosclerotic plaque in the left main, left anterior descending, left circumflex and right coronary arteries. Mediastinum/Nodes: Multiple prominent borderline enlarged mediastinal lymph nodes are noted, nonspecific, but similar to the recent prior study, favored to be reactive. No definite pathologically enlarged mediastinal or hilar lymph nodes are confidently identified on today's noncontrast CT examination. Esophagus is unremarkable in appearance. No axillary lymphadenopathy. Lungs/Pleura: Moderate right chronic partially loculated pleural effusion and small left chronic partially loculated pleural effusion, both of which are stable compared to the prior study. Both of these are associated with extensive areas of pleuroparenchymal  thickening and nodular/mass-like architectural distortion in the lungs bilaterally, with associated pleural tails", most compatible with extensive areas of rounded atelectasis. There continues to be some patchy multifocal ground-glass attenuation in the lungs bilaterally, some of which appears improved compared to the prior study, but other areas have clearly increased compared to the prior examination, with some associated septal thickening, most evident in the anterior aspect of the right upper lobe (best appreciated on axial image 58 of series 3). Musculoskeletal: There are no aggressive appearing lytic or blastic lesions noted in the visualized portions of the skeleton. CT ABDOMEN PELVIS FINDINGS Hepatobiliary: No definite suspicious appearing hepatic lesions are confidently identified on today's noncontrast CT examination. Unenhanced appearance of the gallbladder is unremarkable. Pancreas: No definite pancreatic mass or peripancreatic fluid collections or inflammatory changes are confidently identified on today's noncontrast CT examination. Spleen: Unremarkable. Adrenals/Urinary Tract: Low-attenuation lesions in the upper pole of the right kidney measuring up to 2.9 x 2.3 cm, incompletely characterized on today's non-contrast CT examination, but statistically likely to represent cysts. Other subcentimeter high attenuation lesions are noted in the left kidney, incompletely characterized, but favored to represent proteinaceous/hemorrhagic cysts. Low-attenuation adreniform thickening bilaterally, favored to represent adenomatous hyperplasia. Large left retroperitoneal mass (discussed below) which is favored to be separate from the left adrenal gland, although this does make contact with the anterior aspect of the left adrenal gland such that an adrenal lesion is not entirely excluded. No hydroureteronephrosis. Urinary bladder is markedly distended, but otherwise unremarkable in appearance on today's noncontrast CT  examination. Stomach/Bowel: The appearance of the stomach is normal. No pathologic dilatation of small bowel or colon. The appendix is not confidently identified and may be surgically absent. Regardless, there are no inflammatory changes noted adjacent to the cecum to suggest the presence of an acute appendicitis at this time. Vascular/Lymphatic: Aortic atherosclerosis. Large left para-aortic retroperitoneal mass could represent malignant centrally necrotic lymphadenopathy, however, this is favored to represent a primary retroperitoneal lesion (see discussion below). No other lymphadenopathy noted  elsewhere in the abdomen or pelvis. Reproductive: Prostate gland and seminal vesicles are unremarkable in appearance. Other: Again noted is a large left-sided para-aortic retroperitoneal mass measuring approximately 6.9 x 6.1 x 7.9 cm (axial image 27 of series 2 and coronal image 38 of series 5), previously 6.7 x 5.4 x 6.9 cm). This is intimately associated with the adjacent structures, including the abdominal aorta left renal artery, proximal superior mesenteric artery, anterior aspect of the left adrenal gland, and multiple small bowel loops. The lesion appears separate from the pancreas. The lesion is heterogeneous in attenuation with some central areas that are lower attenuation (21 HU) and peripheral intermediate attenuation (38 HU). No significant volume of ascites. No pneumoperitoneum. Musculoskeletal: There are no aggressive appearing lytic or blastic lesions noted in the visualized portions of the skeleton. IMPRESSION: 1. 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 This may represent a primary retroperitoneal neoplasm, but differential considerations are very broad given the location, and include malignant lymphadenopathy, vascular lesions, lesions of adrenal origin,  gastrointestinal lesion of the small bowel, etc. Further evaluation with abdominal MRI with and without IV gadolinium is recommended to better characterize this finding. 2. Shifting pattern of ground-glass attenuation and septal thickening in the lungs is of uncertain etiology and significance, but is overall slightly worsened and likely of infectious or inflammatory etiology. Infiltrative neoplasm such as metastatic adenocarcinoma is difficult to entirely exclude. 3. Chronic bilateral pleural effusions with extensive rounded atelectasis in both lungs, similar to the prior study. 4. Mild cardiomegaly. 5. Aortic atherosclerosis, in addition to left main and three-vessel coronary artery disease. Assessment for potential risk factor modification, dietary therapy or pharmacologic therapy may be warranted, if clinically indicated. 6. Additional incidental findings, as above. Electronically Signed   By: Vinnie Langton M.D.   On: 12/13/2021 13:31   CT CHEST WO CONTRAST  Result Date: 11/15/2021 CLINICAL DATA:  Respiratory illness. Possible pleural effusion/infiltrates. Shortness of breath. Weakness. EXAM: CT CHEST WITHOUT CONTRAST TECHNIQUE: Multidetector CT imaging of the chest was performed following the standard protocol without IV contrast. RADIATION DOSE REDUCTION: This exam was performed according to the departmental dose-optimization program which includes automated exposure control, adjustment of the mA and/or kV according to patient size and/or use of iterative reconstruction technique. COMPARISON:  Chest x-ray 11/12/2021 FINDINGS: Cardiovascular: Mild cardiomegaly. Calcified plaque over the left main and 3 vessel coronary arteries. Calcified plaque throughout the thoracic aorta which is normal in caliber. Remaining vascular structures are unremarkable. Mediastinum/Nodes: Mild adenopathy over the superior mediastinum. 1.1 cm anterior mediastinal lymph node and 1.3 cm precarinal lymph node. Evaluation for hilar  adenopathy difficult on this noncontrast exam. Remaining mediastinal structures are unremarkable. Lungs/Pleura: Lungs are adequately inflated demonstrate a moderate size right pleural fluid collection with possible areas of loculation. Small left pleural fluid collection. Associated atelectatic change in the lower lobes. Patchy hazy airspace process over the mid to upper lungs right worse than left likely multifocal infection airways are unremarkable. Upper Abdomen: Calcified plaque over the abdominal aorta. Cysts over the upper pole right kidney. Subcentimeter hypo and hyperdense exophytic masses over the mid to upper pole left kidney likely hyperdense cyst, but too small to characterize. Masslike soft tissue density over the left upper abdomen abutting the aorta and incompletely evaluated as may represent a mass versus overlapping small bowel loops. Musculoskeletal: No focal abnormality. IMPRESSION: 1. Patchy hazy airspace process over the  mid to upper lungs right worse than left likely multifocal infection. Moderate size right pleural fluid collection with possible areas of loculation. Small left pleural fluid collection with associated bibasilar atelectasis. Mild mediastinal adenopathy likely reactive. 2. Masslike soft tissue density over the left upper abdomen abutting the aorta and incompletely evaluated. This may represent a mass versus unopacified small bowel loops. Recommend further evaluation with abdominal CT with contrast. 3. Right renal cysts. Subcentimeter left renal hypodensity too small to characterize but likely a cyst. Subcentimeter hyperdense left renal masses likely hyperdense cysts, but too small to characterize. Recommend attention on follow-up. 4. Aortic atherosclerosis. Atherosclerotic coronary artery disease. Aortic Atherosclerosis (ICD10-I70.0). Electronically Signed   By: Marin Olp M.D.   On: 11/15/2021 13:57   US Abdomen Limited  Result Date: 11/16/2021 CLINICAL DATA:  Mass in left  upper quadrant of the abdomen. EXAM: ULTRASOUND ABDOMEN LIMITED COMPARISON:  CT chest 11/15/2021. FINDINGS: Focused ultrasound in the left upper quadrant was performed. As seen on comparison CT, there is a heterogeneous, partly cystic mass in the left upper quadrant, measuring 4.6 x 6.3 x 6.0 cm. IMPRESSION: Solid mass in the left upper quadrant, with a minimal cystic component. CT abdomen pelvis with contrast is recommended in further evaluation as findings are worrisome for malignancy. Electronically Signed   By: Lorin Picket M.D.   On: 11/16/2021 13:54   DG Chest Portable 1 View  Result Date: 12/13/2021 CLINICAL DATA:  Shortness of breath.  Renal failure. EXAM: PORTABLE CHEST 1 VIEW COMPARISON:  12/10/2021 radiograph And prior studies FINDINGS: Cardiomediastinal silhouette is unchanged. Pulmonary vascular congestion again noted. Moderate RIGHT pleural effusion and small to moderate LEFT pleural effusion are again noted. Unchanged bilateral pulmonary opacities/scarring again identified. Nodular opacity overlying the RIGHT UPPER lung appears slightly more prominent which is likely technical. There is no evidence of pneumothorax or acute bony abnormality. IMPRESSION: Little significant change in appearance of the chest with pulmonary vascular congestion, bilateral pleural effusions and unchanged bilateral pulmonary opacities. Electronically Signed   By: Margarette Canada M.D.   On: 12/13/2021 10:49   ECHOCARDIOGRAM COMPLETE  Result Date: 11/14/2021    ECHOCARDIOGRAM REPORT   Patient Name:   Adam Reid Date of Exam: 11/14/2021 Medical Rec #:  [id number]        Height:       66.0 in Accession #:    [id number]       Weight:       112.7 lb Date of Birth:  1946/05/28        BSA:          1.567 m Patient Age:    47 years         BP:           156/80 mmHg Patient Gender: M                HR:           76 bpm. 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 Abnormal ECG, Mitral Valve Disease,                 Arrythmias:Atrial Fibrillation; Risk Factors:Hypertension,                 Diabetes and Current Smoker.  Sonographer:    Roseanna Rainbow RDCS Referring Phys: [place]  1. Left ventricular ejection fraction, by estimation, is 55 to 60%. The left ventricle has normal function. The left ventricle has no regional wall motion abnormalities. There is mild left ventricular hypertrophy of the basal-septal segment. Left ventricular diastolic parameters are consistent with Grade III diastolic dysfunction (restrictive). Elevated left atrial pressure.  2. Right ventricular systolic function is normal. The right ventricular size is mildly enlarged. There is mildly elevated pulmonary artery systolic pressure.  3. Left atrial size was moderately dilated.  4. 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tion. No evidence of mitral stenosis.  5. The aortic valve is tricuspid. Aortic valve regurgitation is not visualized. Aortic valve sclerosis is present, with no evidence of aortic valve stenosis.  6. The inferior vena cava is dilated in size with >50% respiratory variability, suggesting right atrial pressure of 8 mmHg. FINDINGS  Left Ventricle: Left ventricular ejection fraction, by estimation, is 55 to 60%. The left ventricle has normal function. The left ventricle has no regional wall motion abnormalities. The left ventricular internal cavity size was normal in size. There is  mild left ventricular hypertrophy of the basal-septal segment. Left ventricular diastolic parameters are consistent with Grade III diastolic dysfunction (restrictive). Elevated left atrial  pressure. Right Ventricle: The right ventricular size is mildly enlarged. Right ventricular systolic function is normal. There is mildly elevated pulmonary artery systolic pressure. The tricuspid regurgitant velocity is 2.74 m/s, and with an assumed right atrial pressure of 8 mmHg, the estimated right ventricular systolic pressure is 16.1 mmHg. Left Atrium: Left atrial size was moderately dilated. Right Atrium: Right atrial size was normal in size. Pericardium: There is no evidence of pericardial effusion. Mitral Valve: 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tion. No evidence of mitral valve stenosis. Tricuspid Valve: The tricuspid valve is normal in structure. Tricuspid valve regurgitation is mild . No evidence of tricuspid stenosis. Aortic Valve: The aortic valve is tricuspid. Aortic valve regurgitation is not visualized. Aortic valve sclerosis is present, with no evidence of aortic valve stenosis. Pulmonic Valve: The pulmonic valve was normal in structure. Pulmonic valve regurgitation is mild. No evidence of pulmonic stenosis. Aorta: The aortic root is normal in size and structure. Venous: The inferior vena cava is dilated in size with greater than 50% respiratory variability, suggesting right atrial pressure of 8 mmHg. IAS/Shunts: No atrial level shunt detected by color flow Doppler.  LEFT VENTRICLE PLAX 2D LVIDd:         4.40 cm      Diastology LVIDs:         2.80 cm      LV e' medial:    5.40 cm/s LV PW:         1.00 cm      LV E/e' medial:  23.1 LV IVS:        1.30 cm      LV e' lateral:   9.15 cm/s LVOT diam:     1.90 cm      LV E/e' lateral: 13.6 LV SV:         60 LV SV Index:   39 LVOT Area:  2.84 cm                              3D Volume EF: LV Volumes (MOD)            3D EF:        55 % LV vol d, MOD A2C: 125.5 ml LV EDV:       164 ml LV vol d, MOD A4C: 123.0 ml LV ESV:       74 ml LV vol s, MOD A2C: 57.6 ml  LV SV:        91 ml LV vol s, MOD A4C: 56.7 ml LV SV MOD A2C:      67.9 ml LV SV MOD A4C:     123.0 ml LV SV MOD BP:      67.5 ml RIGHT VENTRICLE             IVC RV S prime:     14.20 cm/s  IVC diam: 2.40 cm TAPSE (M-mode): 1.6 cm LEFT ATRIUM             Index        RIGHT ATRIUM           Index LA diam:        4.60 cm 2.94 cm/m   RA Area:     17.40 cm LA Vol (A2C):   40.0 ml 25.53 ml/m  RA Volume:   51.40 ml  32.80 ml/m LA Vol (A4C):   63.0 ml 40.21 ml/m LA Biplane Vol: 51.3 ml 32.74 ml/m  AORTIC VALVE             PULMONIC VALVE LVOT Vmax:   119.00 cm/s PR End Diast Vel: 2.62 msec LVOT Vmean:  78.100 cm/s LVOT VTI:    0.213 m  AORTA Ao Root diam: 3.20 cm MITRAL VALVE                  TRICUSPID VALVE MV Area (PHT): 4.44 cm       TR Peak grad:   30.0 mmHg MV Decel Time: 171 msec       TR Vmax:        274.00 cm/s MR Peak grad:    112.8 mmHg MR Mean grad:    81.0 mmHg    SHUNTS MR Vmax:         531.00 cm/s  Systemic VTI:  0.21 m MR Vmean:        429.0 cm/s   Systemic Diam: 1.90 cm MR PISA:         1.27 cm MR PISA Eff ROA: 10 mm MR PISA Radius:  0.45 cm MV E velocity: 124.50 cm/s MV A velocity: 35.30 cm/s MV E/A ratio:  3.53 Kirk Ruths MD Electronically signed by Kirk Ruths MD Signature Date/Time: 11/14/2021/2:49:00 PM    Final    CT ABDOMINAL MASS BIOPSY  Result Date: 11/20/2021 INDICATION: 76 year old male with necrotic left retroperitoneal mass/adenopathy of uncertain etiology. He presents for CT-guided biopsy. Of note, there are multiple potential vascular structures surrounding mass. Therefore, CT with intravenous contrast will be performed in the arterial and venous phase at the time of biopsy in order to identify the vascular structures and planned a biopsy trajectory to avoid them. EXAM: CT-guided biopsy, left retroperitoneal adenopathy/mass MEDICATIONS: None. ANESTHESIA/SEDATION: Moderate (conscious) sedation was employed during this procedure. A total of Versed 1 mg and Fentanyl 50 mcg was administered intravenously by the  radiology nurse. Total  intra-service moderate Sedation Time: 19 minutes. The patient's level of consciousness and vital signs were monitored continuously by radiology nursing throughout the procedure under my direct supervision. COMPLICATIONS: None immediate. PROCEDURE: Informed written consent was obtained from the patient after a thorough discussion of the procedural risks, benefits and alternatives. All questions were addressed. Maximal Sterile Barrier Technique was utilized including caps, mask, sterile gowns, sterile gloves, sterile drape, hand hygiene and skin antiseptic. A timeout was performed prior to the initiation of the procedure. Axial CT imaging was performed in the arterial and venous phases. The mass is hypervascular with peripheral enhancement and central necrosis. The renal artery and its branches are successfully identified. On delayed phase imaging, there is a retroaortic renal vein which passes posterior to the mass along a region of likely biopsy trajectory. We will be careful to avoid the structure. The ureter is also identified. The only suitable window for biopsy is a 5 mm window between the ureter and retroaortic renal vein. A suitable skin entry site was selected and marked. Local anesthesia was attained by infiltration with 1% lidocaine. First, a 86 gauge spinal needle was advanced into the location of choice. Local anesthesia was then performed with the assistance of 1% lidocaine. This was also used to provide some buffer and displaced the ureter slightly. Next, a 15 cm trocar needle was carefully advanced along the same trajectory and successfully positioned in the space between the retroaortic renal vein and the ureter. Biopsies were then performed utilizing an 18 gauge bio Pince automated biopsy device. Biopsy specimens were placed in both saline and formalin and delivered to pathology for further analysis. Post biopsy axial CT imaging demonstrates small volume perilesional hemorrhage no evidence of  contrast extravasation or other complication. IMPRESSION: 1. CT imaging with contrast identifies variant retroaortic left renal vein. There is a small 5 mm window between the ureter and the retroaortic renal vein for biopsy. 2. Successful percutaneous core biopsy. Electronically Signed   By: Jacqulynn Cadet M.D.   On: 11/20/2021 16:31     CT ABDOMEN PELVIS WO CONTRAST  Result Date: 12/13/2021 CLINICAL DATA:  76 year old male with history of abnormal chest x-ray. Pneumonia. Pleural effusion. Suspected malignancy. EXAM: CT CHEST, ABDOMEN AND PELVIS WITHOUT CONTRAST TECHNIQUE: Multidetector CT imaging of the chest, abdomen and pelvis was performed following the standard protocol without IV contrast. RADIATION DOSE REDUCTION: This exam was performed according to the departmental dose-optimization program which includes automated exposure control, adjustment of the mA and/or kV according to patient size and/or use of iterative reconstruction technique. COMPARISON:  Chest CT 11/15/2021. CT the abdomen and pelvis 11/17/2021. FINDINGS: Comment: Today's study is severely limited for detection and characterization of visceral and/or vascular lesions by lack of IV contrast. CT CHEST FINDINGS Cardiovascular: Heart size is mildly enlarged. There is no significant pericardial fluid, thickening or pericardial calcification. There is aortic atherosclerosis, as well as atherosclerosis of the great vessels of the mediastinum and the coronary arteries, including calcified atherosclerotic plaque in the left main, left anterior descending, left circumflex and right coronary arteries. Mediastinum/Nodes: Multiple prominent borderline enlarged mediastinal lymph nodes are noted, nonspecific, but similar to the recent prior study, favored to be reactive. No definite pathologically enlarged mediastinal or hilar lymph nodes are confidently identified on today's noncontrast CT examination. Esophagus is unremarkable in appearance. No  axillary lymphadenopathy. Lungs/Pleura: Moderate right chronic partially loculated pleural effusion and small left chronic partially loculated pleural effusion, both of which are stable compared to the  prior study. Both of these are associated with extensive areas of pleuroparenchymal thickening and nodular/mass-like architectural distortion in the lungs bilaterally, with associated pleural tails", most compatible with extensive areas of rounded atelectasis. There continues to be some patchy multifocal ground-glass attenuation in the lungs bilaterally, some of which appears improved compared to the prior study, but other areas have clearly increased compared to the prior examination, with some associated septal thickening, most evident in the anterior aspect of the right upper lobe (best appreciated on axial image 58 of series 3). Musculoskeletal: There are no aggressive appearing lytic or blastic lesions noted in the visualized portions of the skeleton. CT ABDOMEN PELVIS FINDINGS Hepatobiliary: No definite suspicious appearing hepatic lesions are confidently identified on today's noncontrast CT examination. Unenhanced appearance of the gallbladder is unremarkable. Pancreas: No definite pancreatic mass or peripancreatic fluid collections or inflammatory changes are confidently identified on today's noncontrast CT examination. Spleen: Unremarkable. Adrenals/Urinary Tract: Low-attenuation lesions in the upper pole of the right kidney measuring up to 2.9 x 2.3 cm, incompletely characterized on today's non-contrast CT examination, but statistically likely to represent cysts. Other subcentimeter high attenuation lesions are noted in the left kidney, incompletely characterized, but favored to represent proteinaceous/hemorrhagic cysts. Low-attenuation adreniform thickening bilaterally, favored to represent adenomatous hyperplasia. Large left retroperitoneal mass (discussed below) which is favored to be separate from the  left adrenal gland, although this does make contact with the anterior aspect of the left adrenal gland such that an adrenal lesion is not entirely excluded. No hydroureteronephrosis. Urinary bladder is markedly distended, but otherwise unremarkable in appearance on today's noncontrast CT examination. Stomach/Bowel: The appearance of the stomach is normal. No pathologic dilatation of small bowel or colon. The appendix is not confidently identified and may be surgically absent. Regardless, there are no inflammatory changes noted adjacent to the cecum to suggest the presence of an acute appendicitis at this time. Vascular/Lymphatic: Aortic atherosclerosis. Large left para-aortic retroperitoneal mass could represent malignant centrally necrotic lymphadenopathy, however, this is favored to represent a primary retroperitoneal lesion (see discussion below). No other lymphadenopathy noted elsewhere in the abdomen or pelvis. Reproductive: Prostate gland and seminal vesicles are unremarkable in appearance. Other: Again noted is a large left-sided para-aortic retroperitoneal mass measuring approximately 6.9 x 6.1 x 7.9 cm (axial image 27 of series 2 and coronal image 38 of series 5), previously 6.7 x 5.4 x 6.9 cm). This is intimately associated with the adjacent structures, including the abdominal aorta left renal artery, proximal superior mesenteric artery, anterior aspect of the left adrenal gland, and multiple small bowel loops. The lesion appears separate from the pancreas. The lesion is heterogeneous in attenuation with some central areas that are lower attenuation (21 HU) and peripheral intermediate attenuation (38 HU). No significant volume of ascites. No pneumoperitoneum. Musculoskeletal: There are no aggressive appearing lytic or blastic lesions noted in the visualized portions of the skeleton. IMPRESSION: 1. Interval enlargement of an aggressive appearing presumably malignant lesion in the left retroperitoneum which  currently measures 6.9 x 6.1 x 7.9 cm, highly concerning for neoplasm. This may represent a primary retroperitoneal neoplasm, but differential considerations are very broad given the location, and include malignant lymphadenopathy, vascular lesions, lesions of adrenal origin, gastrointestinal lesion of the small bowel, etc. Further evaluation with abdominal MRI with and without IV gadolinium is recommended to better characterize this finding. 2. Shifting pattern of ground-glass attenuation and septal thickening in the lungs is of uncertain etiology and significance, but is overall slightly worsened and likely of  infectious or inflammatory etiology. Infiltrative neoplasm such as metastatic adenocarcinoma is difficult to entirely exclude. 3. Chronic bilateral pleural effusions with extensive rounded atelectasis in both lungs, similar to the prior study. 4. Mild cardiomegaly. 5. Aortic atherosclerosis, in addition to left main and three-vessel coronary artery disease. Assessment for potential risk factor modification, dietary therapy or pharmacologic therapy may be warranted, if clinically indicated. 6. Additional incidental findings, as above. Electronically Signed   By: Vinnie Langton M.D.   On: 12/13/2021 13:31   CT ABDOMEN PELVIS WO CONTRAST  Result Date: 11/17/2021 CLINICAL DATA:  Mass lesion seen in the upper abdomen in the previous CT chest done earlier. EXAM: CT ABDOMEN AND PELVIS WITHOUT CONTRAST TECHNIQUE: Multidetector CT imaging of the abdomen and pelvis was performed following the standard protocol without IV contrast. RADIATION DOSE REDUCTION: This exam was performed according to the departmental dose-optimization program which includes automated exposure control, adjustment of the mA and/or kV according to patient size and/or use of iterative reconstruction technique. COMPARISON:  CT chest done on 11/15/2021 FINDINGS: Lower chest: Small left pleural effusion is seen. Moderate right pleural effusion  is noted. There are patchy infiltrates in the visualized lower lung fields, more so on the right side suggesting atelectasis/pneumonia. Coronary artery calcifications are seen. Heart is enlarged in size. Hepatobiliary: No focal abnormality is seen in the liver. There is no dilation of bile ducts. Gallbladder is not distended. Pancreas: No focal abnormality is seen. Spleen: Unremarkable. Adrenals/Urinary Tract: There is 2 cm low-density nodule in the left adrenal. Density measurements are less than 3 Hounsfield units. There is hyperplasia of right adrenal without discrete nodules. There is no hydronephrosis. There is 2.6 cm fluid density lesion in the upper pole of right kidney, possibly a cyst. There are smaller exophytic lesions in the lateral margin of left kidney. In the image 34 of series 3, there is 6 mm exophytic nodule with density measurements higher than usual for simple cyst. There are no renal or ureteral stones. Urinary bladder is not distended. Stomach/Bowel: Stomach is unremarkable. Small bowel loops are not dilated. Appendix is not seen. Moderate to large amount of stool is seen in the colon. There is no significant wall thickening in colon. There is no pericolic stranding. Scattered diverticula are seen in colon without signs of focal diverticulitis. Vascular/Lymphatic: Extensive arterial calcifications are seen. There is 6.7 x 5.4 cm mass lesion inseparable from the left margin of abdominal aorta at the level of left kidney. There is low-attenuation in the central portion of the lesion. There is no significant inflammatory stranding in the margin of this mass. Evaluation of the mass is limited in this noncontrast CT. Reproductive: There is marked enlargement of prostate projecting into the base of the bladder. Other: There is no pneumoperitoneum. There is no ascites. Bilateral inguinal hernias containing fat are noted. Musculoskeletal: Unremarkable. IMPRESSION: There is 6.7 x 5.4 cm mass lesion in  the retroperitoneum in the left para-aortic location at the level of the left kidney. There is low-density in the central portion of the lesion. Findings suggest possible pathological lymphadenopathy with central necrosis or soft tissue malignancy. Without intravenous contrast, possibility of the lesion arising from or infiltrating left lateral wall of abdominal aorta is not excluded. Contrast enhanced CT or MRI should be considered for further characterization. No other significant lymphadenopathy is noted. There is no evidence of intestinal obstruction or pneumoperitoneum. There is no hydronephrosis. Markedly enlarged prostate projecting into the bladder. Bilateral pleural effusions, more so on the right  side. Infiltrates are noted in the visualized lower lung fields, more so on the right side suggesting atelectasis/pneumonia. Coronary artery calcifications are seen. Other findings as described in the body of the report. Electronically Signed   By: Elmer Picker M.D.   On: 11/17/2021 18:53   DG Chest 2 View  Result Date: 12/10/2021 CLINICAL DATA:  Pleural effusions, weight gain pain, edema, CHF EXAM: CHEST - 2 VIEW COMPARISON:  11/19/2021 FINDINGS: Upper normal heart size. Atherosclerotic calcification and mild tortuosity of thoracic aorta. Pulmonary vascularity normal. Extensive scarring in the mid lungs bilaterally with BILATERAL pleural effusions, larger on RIGHT. Bibasilar infiltrates and atelectasis. Nodular focus versus atelectasis in the RIGHT upper lobe again identified. Posterior opacity in the mid to lower chest on lateral view unchanged. No pneumothorax. IMPRESSION: Persistent BILATERAL pleural effusions larger on RIGHT. BILATERAL pulmonary scarring with persistent bibasilar infiltrates and atelectasis. Persistent nodular density or atelectasis at lateral RIGHT upper lobe. Electronically Signed   By: Lavonia Dana M.D.   On: 12/10/2021 14:17   DG Chest 2 View  Result Date: 11/19/2021 CLINICAL  DATA:  Pleural effusions EXAM: CHEST - 2 VIEW COMPARISON:  Previous studies including the chest radiograph done on 11/12/2021 and CT chest done on 11/15/2021 FINDINGS: Transverse diameter of heart is increased. Small to moderate bilateral pleural effusions are seen, more so on the right side. Part of the effusions appear to be loculated in the periphery of mid lung fields. There is interval increase in patchy infiltrates in the right parahilar region and left lower lung fields. Are linear densities in the lateral aspect of left mid lung fields suggesting possible scarring. There is no pneumothorax. IMPRESSION: There is interval increase in patchy infiltrates in right parahilar region and left lower lung fields suggesting worsening atelectasis/pneumonia. Small to moderate bilateral pleural effusions, more so on the right side. Electronically Signed   By: Elmer Picker M.D.   On: 11/19/2021 08:13   CT Chest Wo Contrast  Result Date: 12/13/2021 CLINICAL DATA:  76 year old male with history of abnormal chest x-ray. Pneumonia. Pleural effusion. Suspected malignancy. EXAM: CT CHEST, ABDOMEN AND PELVIS WITHOUT CONTRAST TECHNIQUE: Multidetector CT imaging of the chest, abdomen and pelvis was performed following the standard protocol without IV contrast. RADIATION DOSE REDUCTION: This exam was performed according to the departmental dose-optimization program which includes automated exposure control, adjustment of the mA and/or kV according to patient size and/or use of iterative reconstruction technique. COMPARISON:  Chest CT 11/15/2021. CT the abdomen and pelvis 11/17/2021. FINDINGS: Comment: Today's study is severely limited for detection and characterization of visceral and/or vascular lesions by lack of IV contrast. CT CHEST FINDINGS Cardiovascular: Heart size is mildly enlarged. There is no significant pericardial fluid, thickening or pericardial calcification. There is aortic atherosclerosis, as well as  atherosclerosis of the great vessels of the mediastinum and the coronary arteries, including calcified atherosclerotic plaque in the left main, left anterior descending, left circumflex and right coronary arteries. Mediastinum/Nodes: Multiple prominent borderline enlarged mediastinal lymph nodes are noted, nonspecific, but similar to the recent prior study, favored to be reactive. No definite pathologically enlarged mediastinal or hilar lymph nodes are confidently identified on today's noncontrast CT examination. Esophagus is unremarkable in appearance. No axillary lymphadenopathy. Lungs/Pleura: Moderate right chronic partially loculated pleural effusion and small left chronic partially loculated pleural effusion, both of which are stable compared to the prior study. Both of these are associated with extensive areas of pleuroparenchymal thickening and nodular/mass-like architectural distortion in the lungs bilaterally, with associated  pleural tails", most compatible with extensive areas of rounded atelectasis. There continues to be some patchy multifocal ground-glass attenuation in the lungs bilaterally, some of which appears improved compared to the prior study, but other areas have clearly increased compared to the prior examination, with some associated septal thickening, most evident in the anterior aspect of the right upper lobe (best appreciated on axial image 58 of series 3). Musculoskeletal: There are no aggressive appearing lytic or blastic lesions noted in the visualized portions of the skeleton. CT ABDOMEN PELVIS FINDINGS Hepatobiliary: No definite suspicious appearing hepatic lesions are confidently identified on today's noncontrast CT examination. Unenhanced appearance of the gallbladder is unremarkable. Pancreas: No definite pancreatic mass or peripancreatic fluid collections or inflammatory changes are confidently identified on today's noncontrast CT examination. Spleen: Unremarkable. Adrenals/Urinary  Tract: Low-attenuation lesions in the upper pole of the right kidney measuring up to 2.9 x 2.3 cm, incompletely characterized on today's non-contrast CT examination, but statistically likely to represent cysts. Other subcentimeter high attenuation lesions are noted in the left kidney, incompletely characterized, but favored to represent proteinaceous/hemorrhagic cysts. Low-attenuation adreniform thickening bilaterally, favored to represent adenomatous hyperplasia. Large left retroperitoneal mass (discussed below) which is favored to be separate from the left adrenal gland, although this does make contact with the anterior aspect of the left adrenal gland such that an adrenal lesion is not entirely excluded. No hydroureteronephrosis. Urinary bladder is markedly distended, but otherwise unremarkable in appearance on today's noncontrast CT examination. Stomach/Bowel: The appearance of the stomach is normal. No pathologic dilatation of small bowel or colon. The appendix is not confidently identified and may be surgically absent. Regardless, there are no inflammatory changes noted adjacent to the cecum to suggest the presence of an acute appendicitis at this time. Vascular/Lymphatic: Aortic atherosclerosis. Large left para-aortic retroperitoneal mass could represent malignant centrally necrotic lymphadenopathy, however, this is favored to represent a primary retroperitoneal lesion (see discussion below). No other lymphadenopathy noted elsewhere in the abdomen or pelvis. Reproductive: Prostate gland and seminal vesicles are unremarkable in appearance. Other: Again noted is a large left-sided para-aortic retroperitoneal mass measuring approximately 6.9 x 6.1 x 7.9 cm (axial image 27 of series 2 and coronal image 38 of series 5), previously 6.7 x 5.4 x 6.9 cm). This is intimately associated with the adjacent structures, including the abdominal aorta left renal artery, proximal superior mesenteric artery, anterior aspect of  the left adrenal gland, and multiple small bowel loops. The lesion appears separate from the pancreas. The lesion is heterogeneous in attenuation with some central areas that are lower attenuation (21 HU) and peripheral intermediate attenuation (38 HU). No significant volume of ascites. No pneumoperitoneum. Musculoskeletal: There are no aggressive appearing lytic or blastic lesions noted in the visualized portions of the skeleton. IMPRESSION: 1. 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 This may represent a primary retroperitoneal neoplasm, but differential considerations are very broad given the location, and include malignant lymphadenopathy, vascular lesions, lesions of adrenal origin, gastrointestinal lesion of the small bowel, etc. Further evaluation with abdominal MRI with and without IV gadolinium is recommended to better characterize this finding. 2. Shifting pattern of ground-glass attenuation and septal thickening in the lungs is of uncertain etiology and significance, but is overall slightly worsened and likely of infectious or inflammatory etiology. Infiltrative neoplasm such as metastatic adenocarcinoma is difficult to entirely exclude. 3. Chronic bilateral pleural effusions with extensive rounded  atelectasis in both lungs, similar to the prior study. 4. Mild cardiomegaly. 5. Aortic atherosclerosis, in addition to left main and three-vessel coronary artery disease. Assessment for potential risk factor modification, dietary therapy or pharmacologic therapy may be warranted, if clinically indicated. 6. Additional incidental findings, as above. Electronically Signed   By: Vinnie Langton M.D.   On: 12/13/2021 13:31   CT CHEST WO CONTRAST  Result Date: 11/15/2021 CLINICAL DATA:  Respiratory illness. Possible pleural effusion/infiltrates. Shortness of breath.  Weakness. EXAM: CT CHEST WITHOUT CONTRAST TECHNIQUE: Multidetector CT imaging of the chest was performed following the standard protocol without IV contrast. RADIATION DOSE REDUCTION: This exam was performed according to the departmental dose-optimization program which includes automated exposure control, adjustment of the mA and/or kV according to patient size and/or use of iterative reconstruction technique. COMPARISON:  Chest x-ray 11/12/2021 FINDINGS: Cardiovascular: Mild cardiomegaly. Calcified plaque over the left main and 3 vessel coronary arteries. Calcified plaque throughout the thoracic aorta which is normal in caliber. Remaining vascular structures are unremarkable. Mediastinum/Nodes: Mild adenopathy over the superior mediastinum. 1.1 cm anterior mediastinal lymph node and 1.3 cm precarinal lymph node. Evaluation for hilar adenopathy difficult on this noncontrast exam. Remaining mediastinal structures are unremarkable. Lungs/Pleura: Lungs are adequately inflated demonstrate a moderate size right pleural fluid collection with possible areas of loculation. Small left pleural fluid collection. Associated atelectatic change in the lower lobes. Patchy hazy airspace process over the mid to upper lungs right worse than left likely multifocal infection airways are unremarkable. Upper Abdomen: Calcified plaque over the abdominal aorta. Cysts over the upper pole right kidney. Subcentimeter hypo and hyperdense exophytic masses over the mid to upper pole left kidney likely hyperdense cyst, but too small to characterize. Masslike soft tissue density over the left upper abdomen abutting the aorta and incompletely evaluated as may represent a mass versus overlapping small bowel loops. Musculoskeletal: No focal abnormality. IMPRESSION: 1. Patchy hazy airspace process over the mid to upper lungs right worse than left likely multifocal infection. Moderate size right pleural fluid collection with possible areas of  loculation. Small left pleural fluid collection with associated bibasilar atelectasis. Mild mediastinal adenopathy likely reactive. 2. Masslike soft tissue density over the left upper abdomen abutting the aorta and incompletely evaluated. This may represent a mass versus unopacified small bowel loops. Recommend further evaluation with abdominal CT with contrast. 3. Right renal cysts. Subcentimeter left renal hypodensity too small to characterize but likely a cyst. Subcentimeter hyperdense left renal masses likely hyperdense cysts, but too small to characterize. Recommend attention on follow-up. 4. Aortic atherosclerosis. Atherosclerotic coronary artery disease. Aortic Atherosclerosis (ICD10-I70.0). Electronically Signed   By: Marin Olp M.D.   On: 11/15/2021 13:57   US Abdomen Limited  Result Date: 11/16/2021 CLINICAL DATA:  Mass in left upper quadrant of the abdomen. EXAM: ULTRASOUND ABDOMEN LIMITED COMPARISON:  CT chest 11/15/2021. FINDINGS: Focused ultrasound in the left upper quadrant was performed. As seen on comparison CT, there is a heterogeneous, partly cystic mass in the left upper quadrant, measuring 4.6 x 6.3 x 6.0 cm. IMPRESSION: Solid mass in the left upper quadrant, with a minimal cystic component. CT abdomen pelvis with contrast is recommended in further evaluation as findings are worrisome for malignancy. Electronically Signed   By: Lorin Picket M.D.   On: 11/16/2021 13:54   DG Chest Portable 1 View  Result Date: 12/13/2021 CLINICAL DATA:  Shortness of breath.  Renal failure. EXAM: PORTABLE CHEST 1 VIEW COMPARISON:  12/10/2021 radiograph And prior studies FINDINGS:  Cardiomediastinal silhouette is unchanged. Pulmonary vascular congestion again noted. Moderate RIGHT pleural effusion and small to moderate LEFT pleural effusion are again noted. Unchanged bilateral pulmonary opacities/scarring again identified. Nodular opacity overlying the RIGHT UPPER lung appears slightly more prominent  which is likely technical. There is no evidence of pneumothorax or acute bony abnormality. IMPRESSION: Little significant change in appearance of the chest with pulmonary vascular congestion, bilateral pleural effusions and unchanged bilateral pulmonary opacities. Electronically Signed   By: Margarette Canada M.D.   On: 12/13/2021 10:49   ECHOCARDIOGRAM COMPLETE  Result Date: 11/14/2021    ECHOCARDIOGRAM REPORT   Patient Name:   Adam Reid Date of Exam: 11/14/2021 Medical Rec #:  [id number]        Height:       66.0 in Accession #:    [id number]       Weight:       112.7 lb Date of Birth:  [birth year]        BSA:          1.567 m Patient Age:    40 years         BP:           156/80 mmHg Patient Gender: M                HR:           76 bpm. 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 Abnormal ECG, Mitral Valve Disease,                 Arrythmias:Atrial Fibrillation; Risk Factors:Hypertension,                 Diabetes and Current Smoker.  Sonographer:    Roseanna Rainbow RDCS Referring Phys: [place]  1. Left ventricular ejection fraction, by estimation, is 55 to 60%. The left ventricle has normal function. The left ventricle has no regional wall motion abnormalities. There is mild left ventricular hypertrophy of the basal-septal segment. Left ventricular diastolic parameters are consistent with Grade III diastolic dysfunction (restrictive). Elevated left atrial pressure.  2. Right ventricular systolic function is normal. The right ventricular size is mildly enlarged. There is mildly elevated pulmonary artery systolic pressure.  3. Left atrial size was  moderately dilated.  4. 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tion. No evidence of mitral stenosis.  5. The aortic valve is tricuspid. Aortic valve regurgitation is not visualized. Aortic valve sclerosis is present, with no evidence of aortic valve stenosis.  6. The inferior vena cava is dilated in size with >50% respiratory variability, suggesting right atrial pressure of 8 mmHg. FINDINGS  Left Ventricle: Left ventricular ejection fraction, by estimation, is 55 to 60%. The left ventricle has normal function. The left ventricle has no regional wall motion abnormalities. The left ventricular internal cavity size was normal in size. There is  mild left ventricular hypertrophy of the basal-septal segment. Left ventricular diastolic  parameters are consistent with Grade III diastolic dysfunction (restrictive). Elevated left atrial pressure. Right Ventricle: The right ventricular size is mildly enlarged. Right ventricular systolic function is normal. There is mildly elevated pulmonary artery systolic pressure. The tricuspid regurgitant velocity is 2.74 m/s, and with an assumed right atrial pressure of 8 mmHg, the estimated right ventricular systolic pressure is 00.9 mmHg. Left Atrium: Left atrial size was moderately dilated. Right Atrium: Right atrial size was normal in size. Pericardium: There is no evidence of pericardial effusion. Mitral Valve: 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tion. No evidence of mitral valve stenosis. Tricuspid Valve: The tricuspid valve is normal in structure. Tricuspid valve regurgitation is mild . No evidence of tricuspid stenosis. Aortic Valve: The aortic valve is tricuspid. Aortic valve regurgitation is not visualized. Aortic valve sclerosis is present, with no evidence of aortic valve stenosis. Pulmonic Valve: The pulmonic valve was normal in structure. Pulmonic valve regurgitation is mild. No evidence of pulmonic stenosis. Aorta: The  aortic root is normal in size and structure. Venous: The inferior vena cava is dilated in size with greater than 50% respiratory variability, suggesting right atrial pressure of 8 mmHg. IAS/Shunts: No atrial level shunt detected by color flow Doppler.  LEFT VENTRICLE PLAX 2D LVIDd:         4.40 cm      Diastology LVIDs:         2.80 cm      LV e' medial:    5.40 cm/s LV PW:         1.00 cm      LV E/e' medial:  23.1 LV IVS:        1.30 cm      LV e' lateral:   9.15 cm/s LVOT diam:     1.90 cm      LV E/e' lateral: 13.6 LV SV:         60 LV SV Index:   39 LVOT Area:     2.84 cm                              3D Volume EF: LV Volumes (MOD)            3D EF:        55 % LV vol d, MOD A2C: 125.5 ml LV EDV:       164 ml LV vol d, MOD A4C: 123.0 ml LV ESV:       74 ml LV vol s, MOD A2C: 57.6 ml  LV SV:        91 ml LV vol s, MOD A4C: 56.7 ml LV SV MOD A2C:     67.9 ml LV SV MOD A4C:     123.0 ml LV SV MOD BP:      67.5 ml RIGHT VENTRICLE             IVC RV S prime:     14.20 cm/s  IVC diam: 2.40 cm TAPSE (M-mode): 1.6 cm LEFT ATRIUM             Index        RIGHT ATRIUM           Index LA diam:        4.60 cm 2.94 cm/m   RA Area:     17.40 cm LA Vol (A2C):   40.0 ml 25.53 ml/m  RA Volume:   51.40 ml  32.80 ml/m LA Vol (A4C):   63.0 ml 40.21 ml/m LA Biplane Vol: 51.3 ml 32.74 ml/m  AORTIC VALVE             PULMONIC VALVE LVOT Vmax:   119.00 cm/s PR End Diast Vel: 2.62 msec LVOT Vmean:  78.100 cm/s LVOT VTI:    0.213 m  AORTA Ao Root diam: 3.20 cm MITRAL VALVE                  TRICUSPID VALVE MV Area (PHT): 4.44 cm       TR Peak grad:   30.0 mmHg MV Decel Time: 171 msec       TR Vmax:        274.00 cm/s MR Peak grad:    112.8 mmHg MR Mean grad:    81.0 mmHg    SHUNTS MR Vmax:         531.00 cm/s  Systemic VTI:  0.21 m MR Vmean:        429.0 cm/s   Systemic Diam: 1.90 cm MR PISA:         1.27 cm MR PISA Eff ROA: 10 mm MR PISA Radius:  0.45 cm MV E velocity: 124.50 cm/s MV A velocity: 35.30 cm/s MV E/A ratio:  3.53  Kirk Ruths MD Electronically signed by Kirk Ruths MD Signature Date/Time: 11/14/2021/2:49:00 PM    Final    CT ABDOMINAL MASS BIOPSY  Result Date: 11/20/2021 INDICATION: 76 year old male with necrotic left retroperitoneal mass/adenopathy of uncertain etiology. He presents for CT-guided biopsy. Of note, there are multiple potential vascular structures surrounding mass. Therefore, CT with intravenous contrast will be performed in the arterial and venous phase at the time of biopsy in order to identify the vascular structures and planned a biopsy trajectory to avoid them. EXAM: CT-guided biopsy, left retroperitoneal adenopathy/mass MEDICATIONS: None. ANESTHESIA/SEDATION: Moderate (conscious) sedation was employed during this procedure. A total of Versed 1 mg and Fentanyl 50 mcg was administered intravenously by the radiology nurse. Total intra-service moderate Sedation Time: 19 minutes. The patient's level of consciousness and vital signs were monitored continuously by radiology nursing throughout the procedure under my direct supervision. COMPLICATIONS: None immediate. PROCEDURE: Informed written consent was obtained from the patient after a thorough discussion of the procedural risks, benefits and alternatives. All questions were addressed. Maximal Sterile Barrier Technique was utilized including caps, mask, sterile gowns, sterile gloves, sterile drape, hand hygiene and skin antiseptic. A timeout was performed prior to the initiation of the procedure. Axial CT imaging was performed in the arterial and venous phases. The mass is hypervascular with peripheral enhancement and central necrosis. The renal artery and its branches are successfully identified. On delayed phase imaging, there is a retroaortic renal vein which passes posterior to the mass along a region of likely biopsy trajectory. We will be careful to avoid the structure. The ureter is also identified. The only suitable window for biopsy is a 5 mm  window between the ureter and retroaortic renal vein. A suitable skin entry site was selected and marked. Local anesthesia was attained by infiltration with 1% lidocaine. First, a 38 gauge spinal needle was advanced into the location of choice. Local anesthesia was then performed with the assistance of 1% lidocaine. This was also used to provide some buffer and displaced the ureter slightly. Next, a 15 cm trocar needle was carefully advanced along the same trajectory and successfully positioned in the space between the retroaortic renal vein and the ureter. Biopsies were then  performed utilizing an 18 gauge bio Pince automated biopsy device. Biopsy specimens were placed in both saline and formalin and delivered to pathology for further analysis. Post biopsy axial CT imaging demonstrates small volume perilesional hemorrhage no evidence of contrast extravasation or other complication. IMPRESSION: 1. CT imaging with contrast identifies variant retroaortic left renal vein. There is a small 5 mm window between the ureter and the retroaortic renal vein for biopsy. 2. Successful percutaneous core biopsy. Electronically Signed   By: Jacqulynn Cadet M.D.   On: 11/20/2021 16:31    Pathology:  SURGICAL PATHOLOGY  CASE: [id number]  PATIENT: [facility]  Surgical Pathology Report   Clinical History: left retroperitoneal mass, likely necrotic adenopathy  (cm)   FINAL MICROSCOPIC DIAGNOSIS:   A. RETROPERITONEAL MASS, LEFT, NEEDLE CORE BIOPSY:  - Paraganglioma, see comment   COMMENT:   The tumor cells show a somewhat nested architecture with finely  granular, and vacuolated abundant amphophilic cytoplasm.  There is no  evidence of necrosis or increased mitotic activity.  Immunohistochemical  stains show that the tumor cells are positive for synaptophysin,  chromogranin and GATA3 (weak to moderate nuclear labeling).  Immunostain  for S100 shows focal staining of sustentacular cells.  Immunostains for   CK7, CK20 and imaging are negative.  This immunoprofile is consistent  with above interpretation.  Differential diagnosis can include a  pheochromocytoma or extra-adrenal paraganglioma.  Clinical and  radiologic correlation is suggested.  Dr. Saralyn Pilar concurs with the  diagnosis.  Dr. [name] was notified on 11/25/21.   Assessment and Plan:  1.  Retroperitoneal mass consistent with paraganglioma 2.  Acute on chronic diastolic CHF 3.  AKI on CKD 4.  Acute urinary retention 5.  Leukocytosis 6.  Normocytic anemia 7.  Type 2 diabetes mellitus 8.  Hypertension 9.  PAF  -Imaging and biopsy results have been reviewed.  He was previously referred to oncology and after review of his records, recommendation was made to refer him to general surgery and endocrinology.  His referrals were placed by his primary care provider but he has not yet been seen.  Recommend general surgery evaluation for removal of the retroperitoneal mass.  Outpatient follow-up with endocrinology. -Cardiology following for his heart failure.  They are adjusting medications and diuretics.  Palliative care consulted. -Nephrology has been consulted and due to the improvement of his BUN and creatinine with diuresis and Foley, there is no indication for hemodialysis at this time. -Noted to have leukocytosis on admission.  He is afebrile.  On antibiotics for suspected UTI. -The patient has stable normocytic anemia likely due to his underlying CKD.  Monitor. -Management of other chronic medical conditions per hospitalist.  Thank you for this referral.   Mikey Bussing, DNP, AGPCNP-BC, AOCNP   ADDENDUM: I saw and examined AdamKorn.  He is from [place].  He really does not able to tell me anything.  He was quite weak.  He slept quite a bit.  I spoke to his wife.  She is incredibly charming.  This paraganglioma that he has typically is not taken care of by medical oncology unless it has metastasized.  I realize that he has a fairly  large retroperitoneal mass.  The primary treatment clearly is surgical resection.  I am not sure if surgical oncology would remove this given his other health issues.  He has chronic renal failure and likely is going to need dialysis.  He had echocardiogram which showed good left ventricular ejection function.  Looks like he is  quite frail.  Looks like he has lost weight.  Paragangliomas are similar to pheochromocytomas.  As such, this is where Endocrinology needs to step in to see if they need to do anything.  I think if he is going to need any treatment for this, it will be radiation therapy.  Sending him to an academic [facility] certainly would be a reasonable option.  However, our Surgical Oncologists in town are very very good and I am sure would be able to tackle this kind of surgery if they felt that he would benefit.  I am just not sure what other studies need to be run on this tumor.  Again, it does not look like it has metastasized.  I am not sure if there is any type of standard chemotherapy protocol that is even used.  I would think that any chemotherapy that would be utilized would have a fairly low rate of effectiveness.  Again his wife is incredibly nice.  She was very helpful with providing some history.  I would say his performance status right now is ECOG 3.  I am just not sure if he would ever be a candidate for surgical resection.  I would think that he would have to really improve his functional status before a surgeon would consider him capable of tolerating what I would think would be of relatively extensive surgical resection.  For now, there is no much else that we need to do for Mr. Achord.  We will be more than happy to follow along as necessary.   Lattie Haw, MD  Psalms 73:26

## 2021-12-14 NOTE — Progress Notes (Signed)
Pt's wife at bedside.

## 2021-12-14 NOTE — Progress Notes (Signed)
Progress Note  Patient: Adam Reid GUY:[id number] DOB: 04-Jun-1946  DOA: 12/13/2021  DOS: 12/14/2021    Brief hospital course: Adam Reid is a 77 y.o. 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 He was hypertensive with JVD and dependent edema, BNP up to 2,005 from 1,516 prior, SCr 4.7 (from baseline 3.8). CT demonstrated enlargement of retroperitoneal mass. The patient was found to be retaining urine, so foley catheter was placed and he was admitted for AKI on stage IV CKD with nephrology, cardiology, and palliative care consulted.   Assessment and Plan: Acute on chronic HFpEF: Echo shows LVEF 55-60%, mild basal-septal LVH and grade III diastolic dysfunction. Also suggests elevated PASP and RV enlargement, IVC dilated. Moderate MR. Low suspicion per cardiology for amyloidosis based on prior work up.  - Started lasix '80mg'$  IV BID, which provided adequate diuresis during previous hospitalization.  - Not on SGLT2i due to renal failure.  - Cardiology consulted, adding metolazone, d/w Dr. Harl Bowie.   AKI on stage IV CKD: Worsening CrCl due in part to urinary retention, though also hypervolemic consistent with cardiorenal syndrome.  - Nephrology consulted. Diuresing as above, no derangements currently pushing our hand toward dialysis thru RUE AVF.  - Continue foley for now.  - Continue po bicarb  Acute urinary retention: Suspect due to BPH based on markedly enlarged prostate on CT imaging.  - Continue foley for now. Add topical lidocaine for discomfort. - Continue home tamsulosin, added finasteride though this will not improve symptoms for some time. Consider increasing dose of tamsulosin and monitoring for tolerance.    Retroperitoneal paraganglioma: Enlarging. 6.7 x 5.4 cm less than a month ago, now 6.9 x 6.1 x  7.9 cm. Location abuts the infrarenal aorta. Contrast would likely be helpful to delineate this better but cannot currently be used.  - D/w oncology who recommended endocrinology and surgical evaluation.  - D/w surgery who will arrange outpatient follow up with oncologic surgery, Dr. Bradd Burner. Declined to see patient in the hospital due to his other active issues.   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 Of note, had an appointment scheduled as outpatient for feraheme and retacrit on 6/7.  Leukocytosis, pyuria, urinary retention:  - Treat empirically for UTI with CTX while awaiting culture.  - There is abnormality on CT chest, ground glass attenuation w/septal thickening, though pt has not focal symptoms of pneumonia. Will monitor respiratory status.  - WBC elevation may very well be due to tumor necrosis.  T2DM: Uncontrolled with brittle glycemic trends, CBG down to 34 with 5u insulin from nearly '300mg'$ /dl.  - Deescalate to very sensitive SSI given his malnutrition and renal impairment.  - Continue formulary -gliptin, hold [facility]  - Hypoglycemic protocols.   Protein calorie malnutrition: Body mass index is 18.54 kg/m. Suspect significant catabolic state given enlarging mass with central necrosis.  - Supplement protein as much as possible.   Bilateral pleural effusions:  - Diuresis as above  PAF/flutter: Currently in NSR. - Continue eliquis - Continue diltiazem, amiodarone  CAD, aortic atherosclerosis: No anginal complaints - Continue statin, anticoagulation for now.   Depression: Quiescent - Continue SSRI   Subjective: Farsi interpretor Adam Reid [phone] used throughout encounter. Pt says he's "fine" except he's got discomfort around his penis. Swelling about the same in  his legs, downplays his shortness of breath.   Objective: Vitals:   12/14/21 0800 12/14/21 1117 12/14/21 1300 12/14/21 1431  BP:  (!) 136/56    Pulse: 86  64 66  Resp: 17  20    Temp:      TempSrc:      SpO2: 92%   97%  Weight:      Height:       Gen: Frail, thin elderly male in no distress Pulm: Nonlabored breathing room air.  CV: Regular rate and rhythm. III/VI HSM at apex without rub, or gallop. + JVD, pitting dependent edema. GI: Abdomen soft, non-tender, non-distended, with normoactive bowel sounds.  Ext: Adequately perfused without deformities Skin: No rashes, lesions or ulcers on visualized skin. Neuro: Alert and oriented. No focal neurological deficits. Psych: Judgement and insight appear fair. Mood euthymic & affect congruent. Behavior is appropriate.    Data Personally reviewed:  CBC: Recent Labs  Lab 12/13/21 1011  WBC 12.4*  NEUTROABS 11.3*  HGB 9.6*  HCT 29.5*  MCV 84.5  PLT 417   Basic Metabolic Panel: Recent Labs  Lab 12/10/21 1339 12/13/21 1011 12/14/21 0347  NA 141 137 138  K 3.7 3.7 3.7  CL 102 100 104  CO2 21 21* 22  GLUCOSE 98 290* 169*  BUN 110* 106* 104*  CREATININE 4.85* 4.75* 4.49*  CALCIUM 8.8 8.6* 8.5*  MG  --  2.1  --    GFR: Estimated Creatinine Clearance: 10.6 mL/min (A) (by C-G formula based on SCr of 4.49 mg/dL (H)). Liver Function Tests: No results for input(s): AST, ALT, ALKPHOS, BILITOT, PROT, ALBUMIN in the last 168 hours. No results for input(s): LIPASE, AMYLASE in the last 168 hours. No results for input(s): AMMONIA in the last 168 hours. Coagulation Profile: No results for input(s): INR, PROTIME in the last 168 hours. Cardiac Enzymes: Recent Labs  Lab 12/13/21 1914  CKTOTAL 93   BNP (last 3 results) Recent Labs    12/10/21 1339  PROBNP 12,645*   HbA1C: No results for input(s): HGBA1C in the last 72 hours. CBG: Recent Labs  Lab 12/14/21 0636 12/14/21 1115 12/14/21 1606 12/14/21 1620 12/14/21 1632  GLUCAP 170* 295* 34* 35* 173*   Lipid Profile: No results for input(s): CHOL, HDL, LDLCALC, TRIG, CHOLHDL, LDLDIRECT in the last 72 hours. Thyroid Function Tests: No results  for input(s): TSH, T4TOTAL, FREET4, T3FREE, THYROIDAB in the last 72 hours. Anemia Panel: No results for input(s): VITAMINB12, FOLATE, FERRITIN, TIBC, IRON, RETICCTPCT in the last 72 hours. Urine analysis:    Component Value Date/Time   COLORURINE STRAW (A) 12/13/2021 1759   APPEARANCEUR CLEAR 12/13/2021 1759   LABSPEC 1.008 12/13/2021 1759   PHURINE 5.0 12/13/2021 1759   GLUCOSEU 50 (A) 12/13/2021 1759   HGBUR SMALL (A) 12/13/2021 1759   BILIRUBINUR NEGATIVE 12/13/2021 1759   BILIRUBINUR negative 03/02/2021 1458   BILIRUBINUR neg 03/23/2015 1404   KETONESUR NEGATIVE 12/13/2021 1759   PROTEINUR 30 (A) 12/13/2021 1759   UROBILINOGEN 0.2 03/02/2021 1458   NITRITE NEGATIVE 12/13/2021 1759   LEUKOCYTESUR NEGATIVE 12/13/2021 1759   No results found for this or any previous visit (from the past 240 hour(s)).   CT ABDOMEN PELVIS WO CONTRAST  Result Date: 12/13/2021 CLINICAL DATA:  76 year old male with history of abnormal chest x-ray. Pneumonia. Pleural effusion. Suspected malignancy. EXAM: CT CHEST, ABDOMEN AND PELVIS WITHOUT CONTRAST TECHNIQUE: Multidetector CT imaging of the chest, abdomen and pelvis was performed following the standard protocol without IV contrast.  RADIATION DOSE REDUCTION: This exam was performed according to the departmental dose-optimization program which includes automated exposure control, adjustment of the mA and/or kV according to patient size and/or use of iterative reconstruction technique. COMPARISON:  Chest CT 11/15/2021. CT the abdomen and pelvis 11/17/2021. FINDINGS: Comment: Today's study is severely limited for detection and characterization of visceral and/or vascular lesions by lack of IV contrast. CT CHEST FINDINGS Cardiovascular: Heart size is mildly enlarged. There is no significant pericardial fluid, thickening or pericardial calcification. There is aortic atherosclerosis, as well as atherosclerosis of the great vessels of the mediastinum and the coronary  arteries, including calcified atherosclerotic plaque in the left main, left anterior descending, left circumflex and right coronary arteries. Mediastinum/Nodes: Multiple prominent borderline enlarged mediastinal lymph nodes are noted, nonspecific, but similar to the recent prior study, favored to be reactive. No definite pathologically enlarged mediastinal or hilar lymph nodes are confidently identified on today's noncontrast CT examination. Esophagus is unremarkable in appearance. No axillary lymphadenopathy. Lungs/Pleura: Moderate right chronic partially loculated pleural effusion and small left chronic partially loculated pleural effusion, both of which are stable compared to the prior study. Both of these are associated with extensive areas of pleuroparenchymal thickening and nodular/mass-like architectural distortion in the lungs bilaterally, with associated pleural tails", most compatible with extensive areas of rounded atelectasis. There continues to be some patchy multifocal ground-glass attenuation in the lungs bilaterally, some of which appears improved compared to the prior study, but other areas have clearly increased compared to the prior examination, with some associated septal thickening, most evident in the anterior aspect of the right upper lobe (best appreciated on axial image 58 of series 3). Musculoskeletal: There are no aggressive appearing lytic or blastic lesions noted in the visualized portions of the skeleton. CT ABDOMEN PELVIS FINDINGS Hepatobiliary: No definite suspicious appearing hepatic lesions are confidently identified on today's noncontrast CT examination. Unenhanced appearance of the gallbladder is unremarkable. Pancreas: No definite pancreatic mass or peripancreatic fluid collections or inflammatory changes are confidently identified on today's noncontrast CT examination. Spleen: Unremarkable. Adrenals/Urinary Tract: Low-attenuation lesions in the upper pole of the right kidney  measuring up to 2.9 x 2.3 cm, incompletely characterized on today's non-contrast CT examination, but statistically likely to represent cysts. Other subcentimeter high attenuation lesions are noted in the left kidney, incompletely characterized, but favored to represent proteinaceous/hemorrhagic cysts. Low-attenuation adreniform thickening bilaterally, favored to represent adenomatous hyperplasia. Large left retroperitoneal mass (discussed below) which is favored to be separate from the left adrenal gland, although this does make contact with the anterior aspect of the left adrenal gland such that an adrenal lesion is not entirely excluded. No hydroureteronephrosis. Urinary bladder is markedly distended, but otherwise unremarkable in appearance on today's noncontrast CT examination. Stomach/Bowel: The appearance of the stomach is normal. No pathologic dilatation of small bowel or colon. The appendix is not confidently identified and may be surgically absent. Regardless, there are no inflammatory changes noted adjacent to the cecum to suggest the presence of an acute appendicitis at this time. Vascular/Lymphatic: Aortic atherosclerosis. Large left para-aortic retroperitoneal mass could represent malignant centrally necrotic lymphadenopathy, however, this is favored to represent a primary retroperitoneal lesion (see discussion below). No other lymphadenopathy noted elsewhere in the abdomen or pelvis. Reproductive: Prostate gland and seminal vesicles are unremarkable in appearance. Other: Again noted is a large left-sided para-aortic retroperitoneal mass measuring approximately 6.9 x 6.1 x 7.9 cm (axial image 27 of series 2 and coronal image 38 of series 5), previously 6.7 x 5.4  x 6.9 cm). This is intimately associated with the adjacent structures, including the abdominal aorta left renal artery, proximal superior mesenteric artery, anterior aspect of the left adrenal gland, and multiple small bowel loops. The lesion  appears separate from the pancreas. The lesion is heterogeneous in attenuation with some central areas that are lower attenuation (21 HU) and peripheral intermediate attenuation (38 HU). No significant volume of ascites. No pneumoperitoneum. Musculoskeletal: There are no aggressive appearing lytic or blastic lesions noted in the visualized portions of the skeleton. IMPRESSION: 1. 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 This may represent a primary retroperitoneal neoplasm, but differential considerations are very broad given the location, and include malignant lymphadenopathy, vascular lesions, lesions of adrenal origin, gastrointestinal lesion of the small bowel, etc. Further evaluation with abdominal MRI with and without IV gadolinium is recommended to better characterize this finding. 2. Shifting pattern of ground-glass attenuation and septal thickening in the lungs is of uncertain etiology and significance, but is overall slightly worsened and likely of infectious or inflammatory etiology. Infiltrative neoplasm such as metastatic adenocarcinoma is difficult to entirely exclude. 3. Chronic bilateral pleural effusions with extensive rounded atelectasis in both lungs, similar to the prior study. 4. Mild cardiomegaly. 5. Aortic atherosclerosis, in addition to left main and three-vessel coronary artery disease. Assessment for potential risk factor modification, dietary therapy or pharmacologic therapy may be warranted, if clinically indicated. 6. Additional incidental findings, as above. Electronically Signed   By: Vinnie Langton M.D.   On: 12/13/2021 13:31   CT Chest Wo Contrast  Result Date: 12/13/2021 CLINICAL DATA:  76 year old male with history of abnormal chest x-ray. Pneumonia. Pleural effusion. Suspected malignancy. EXAM: CT CHEST, ABDOMEN AND PELVIS WITHOUT  CONTRAST TECHNIQUE: Multidetector CT imaging of the chest, abdomen and pelvis was performed following the standard protocol without IV contrast. RADIATION DOSE REDUCTION: This exam was performed according to the departmental dose-optimization program which includes automated exposure control, adjustment of the mA and/or kV according to patient size and/or use of iterative reconstruction technique. COMPARISON:  Chest CT 11/15/2021. CT the abdomen and pelvis 11/17/2021. FINDINGS: Comment: Today's study is severely limited for detection and characterization of visceral and/or vascular lesions by lack of IV contrast. CT CHEST FINDINGS Cardiovascular: Heart size is mildly enlarged. There is no significant pericardial fluid, thickening or pericardial calcification. There is aortic atherosclerosis, as well as atherosclerosis of the great vessels of the mediastinum and the coronary arteries, including calcified atherosclerotic plaque in the left main, left anterior descending, left circumflex and right coronary arteries. Mediastinum/Nodes: Multiple prominent borderline enlarged mediastinal lymph nodes are noted, nonspecific, but similar to the recent prior study, favored to be reactive. No definite pathologically enlarged mediastinal or hilar lymph nodes are confidently identified on today's noncontrast CT examination. Esophagus is unremarkable in appearance. No axillary lymphadenopathy. Lungs/Pleura: Moderate right chronic partially loculated pleural effusion and small left chronic partially loculated pleural effusion, both of which are stable compared to the prior study. Both of these are associated with extensive areas of pleuroparenchymal thickening and nodular/mass-like architectural distortion in the lungs bilaterally, with associated pleural tails", most compatible with extensive areas of rounded atelectasis. There continues to be some patchy multifocal ground-glass attenuation in the lungs bilaterally, some of which  appears improved compared to the prior study, but other areas have clearly increased compared to the prior examination, with some associated septal thickening, most evident in  the anterior aspect of the right upper lobe (best appreciated on axial image 58 of series 3). Musculoskeletal: There are no aggressive appearing lytic or blastic lesions noted in the visualized portions of the skeleton. CT ABDOMEN PELVIS FINDINGS Hepatobiliary: No definite suspicious appearing hepatic lesions are confidently identified on today's noncontrast CT examination. Unenhanced appearance of the gallbladder is unremarkable. Pancreas: No definite pancreatic mass or peripancreatic fluid collections or inflammatory changes are confidently identified on today's noncontrast CT examination. Spleen: Unremarkable. Adrenals/Urinary Tract: Low-attenuation lesions in the upper pole of the right kidney measuring up to 2.9 x 2.3 cm, incompletely characterized on today's non-contrast CT examination, but statistically likely to represent cysts. Other subcentimeter high attenuation lesions are noted in the left kidney, incompletely characterized, but favored to represent proteinaceous/hemorrhagic cysts. Low-attenuation adreniform thickening bilaterally, favored to represent adenomatous hyperplasia. Large left retroperitoneal mass (discussed below) which is favored to be separate from the left adrenal gland, although this does make contact with the anterior aspect of the left adrenal gland such that an adrenal lesion is not entirely excluded. No hydroureteronephrosis. Urinary bladder is markedly distended, but otherwise unremarkable in appearance on today's noncontrast CT examination. Stomach/Bowel: The appearance of the stomach is normal. No pathologic dilatation of small bowel or colon. The appendix is not confidently identified and may be surgically absent. Regardless, there are no inflammatory changes noted adjacent to the cecum to suggest the  presence of an acute appendicitis at this time. Vascular/Lymphatic: Aortic atherosclerosis. Large left para-aortic retroperitoneal mass could represent malignant centrally necrotic lymphadenopathy, however, this is favored to represent a primary retroperitoneal lesion (see discussion below). No other lymphadenopathy noted elsewhere in the abdomen or pelvis. Reproductive: Prostate gland and seminal vesicles are unremarkable in appearance. Other: Again noted is a large left-sided para-aortic retroperitoneal mass measuring approximately 6.9 x 6.1 x 7.9 cm (axial image 27 of series 2 and coronal image 38 of series 5), previously 6.7 x 5.4 x 6.9 cm). This is intimately associated with the adjacent structures, including the abdominal aorta left renal artery, proximal superior mesenteric artery, anterior aspect of the left adrenal gland, and multiple small bowel loops. The lesion appears separate from the pancreas. The lesion is heterogeneous in attenuation with some central areas that are lower attenuation (21 HU) and peripheral intermediate attenuation (38 HU). No significant volume of ascites. No pneumoperitoneum. Musculoskeletal: There are no aggressive appearing lytic or blastic lesions noted in the visualized portions of the skeleton. IMPRESSION: 1. 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 This may represent a primary retroperitoneal neoplasm, but differential considerations are very broad given the location, and include malignant lymphadenopathy, vascular lesions, lesions of adrenal origin, gastrointestinal lesion of the small bowel, etc. Further evaluation with abdominal MRI with and without IV gadolinium is recommended to better characterize this finding. 2. Shifting pattern of ground-glass attenuation and septal thickening in the lungs is of uncertain etiology and  significance, but is overall slightly worsened and likely of infectious or inflammatory etiology. Infiltrative neoplasm such as metastatic adenocarcinoma is difficult to entirely exclude. 3. Chronic bilateral pleural effusions with extensive rounded atelectasis in both lungs, similar to the prior study. 4. Mild cardiomegaly. 5. Aortic atherosclerosis, in addition to left main and three-vessel coronary artery disease. Assessment for potential risk factor modification, dietary therapy or pharmacologic therapy may be warranted, if clinically indicated. 6. Additional incidental findings, as above. Electronically Signed  By: Vinnie Langton M.D.   On: 12/13/2021 13:31   DG Chest Portable 1 View  Result Date: 12/13/2021 CLINICAL DATA:  Shortness of breath.  Renal failure. EXAM: PORTABLE CHEST 1 VIEW COMPARISON:  12/10/2021 radiograph And prior studies FINDINGS: Cardiomediastinal silhouette is unchanged. Pulmonary vascular congestion again noted. Moderate RIGHT pleural effusion and small to moderate LEFT pleural effusion are again noted. Unchanged bilateral pulmonary opacities/scarring again identified. Nodular opacity overlying the RIGHT UPPER lung appears slightly more prominent which is likely technical. There is no evidence of pneumothorax or acute bony abnormality. IMPRESSION: Little significant change in appearance of the chest with pulmonary vascular congestion, bilateral pleural effusions and unchanged bilateral pulmonary opacities. Electronically Signed   By: Margarette Canada M.D.   On: 12/13/2021 10:49    Family Communication: None at bedside  Disposition: Status is: Inpatient Remains inpatient appropriate because: IV diuresis, renal function monitoring. [employer] discussions Planned Discharge Destination: Home  Patrecia Pour, MD 12/14/2021 6:02 PM Page by [email or web address]

## 2021-12-14 NOTE — Progress Notes (Addendum)
Hypoglycemic Event  CBG: 34  Treatment: 4 oz juice/soda and D50 25 mL (12.5 gm)  Symptoms: None  Follow-up CBG: Time:1543 CBG Result 35  Possible Reasons for Event: Other: pt eating very little breakfast and lunch.  Comments/MD notified:after giving OJ x 3 CBG still 33.  D50 was administered. Following with CBG of 173.  Bonner Puna MD aware.     Julianne Handler

## 2021-12-14 NOTE — Consult Note (Signed)
Reason for Consult:AKI/CKD stage IV Referring Physician: Bonner Puna, MD  Adam Reid is an 76 y.o. male with a PMH significant for HTN, DM type II, HLD, chronic diastolic CHF, paroxysmal atrial fibrillation/flutter, paraganglioma, and CKD stage IV (followed by Dr. [name] and baseline 3.3-3.8) who presented to [facility] ED with worsening SOB, swelling of his feet, and dysuria.  In the ED VSS, Labs were notable for Scr 4.7, BNP 2005, Hgb 9.6CT of abdomen revelaed an enlarged retroperitoneal mass.  Bladder scan with 700 mL and foley catheter placed.  He was admitted for acute on chronic diastolic CHF and we were consulted due to AKI/CKD stage IV.  The trend in Scr is seen below.  He has had marked increase in UOP following foley catheter placement.  Through the assistance of an interpreter, he admitted to intermittent nausea but no vomiting.  He denies any SOB, chest pain, and reports improved edema.  Foley catheter is still in place.  Trend in Creatinine: Creatinine, Ser  Date/Time Value Ref Range Status  12/14/2021 03:47 AM 4.49 (H) 0.61 - 1.24 mg/dL Final  12/13/2021 10:11 AM 4.75 (H) 0.61 - 1.24 mg/dL Final  12/10/2021 01:39 PM 4.85 (HH) 0.40 - 1.50 mg/dL Final  11/26/2021 09:15 AM 3.89 (H) 0.40 - 1.50 mg/dL Final  11/24/2021 12:24 PM 4.04 (H) 0.40 - 1.50 mg/dL Final  11/23/2021 03:00 PM 4.10 (H) 0.40 - 1.50 mg/dL Final  11/22/2021 03:51 AM 3.93 (H) 0.61 - 1.24 mg/dL Final  11/21/2021 11:05 AM 3.70 (H) 0.61 - 1.24 mg/dL Final  11/20/2021 03:52 AM 3.83 (H) 0.61 - 1.24 mg/dL Final  11/19/2021 03:58 AM 3.88 (H) 0.61 - 1.24 mg/dL Final  11/18/2021 04:09 AM 3.59 (H) 0.61 - 1.24 mg/dL Final  11/17/2021 09:31 AM 3.79 (H) 0.61 - 1.24 mg/dL Final  11/16/2021 11:50 PM 3.50 (H) 0.61 - 1.24 mg/dL Final  11/16/2021 03:05 AM 3.42 (H) 0.61 - 1.24 mg/dL Final  11/15/2021 02:12 AM 3.42 (H) 0.61 - 1.24 mg/dL Final  11/14/2021 05:59 AM 3.52 (H) 0.61 - 1.24 mg/dL Final  11/13/2021 08:40 AM 3.69 (H) 0.61 - 1.24 mg/dL  Final  11/12/2021 05:30 PM 3.83 (H) 0.61 - 1.24 mg/dL Final  11/09/2021 07:51 PM 3.36 (H) 0.61 - 1.24 mg/dL Final  10/14/2021 12:24 PM 3.93 (H) 0.61 - 1.24 mg/dL Final  09/24/2021 03:55 PM 3.80 (H) 0.40 - 1.50 mg/dL Final  09/18/2021 01:17 AM 3.95 (H) 0.61 - 1.24 mg/dL Final  09/17/2021 12:52 AM 4.15 (H) 0.61 - 1.24 mg/dL Final  09/16/2021 12:57 AM 3.70 (H) 0.61 - 1.24 mg/dL Final  09/15/2021 12:58 AM 3.41 (H) 0.61 - 1.24 mg/dL Final  09/14/2021 12:10 PM 3.23 (H) 0.61 - 1.24 mg/dL Final  09/04/2021 08:20 AM 4.00 (H) 0.61 - 1.24 mg/dL Final  08/14/2021 01:11 PM 3.69 (H) 0.61 - 1.24 mg/dL Final  04/24/2021 01:30 PM 3.51 (H) 0.61 - 1.24 mg/dL Final  01/07/2021 12:33 PM 3.62 (H) 0.40 - 1.50 mg/dL Final  01/02/2021 10:51 AM 3.38 (H) 0.61 - 1.24 mg/dL Final  12/26/2020 03:34 AM 3.51 (H) 0.61 - 1.24 mg/dL Final  12/25/2020 05:10 AM 3.36 (H) 0.61 - 1.24 mg/dL Final  12/24/2020 07:29 AM 3.74 (H) 0.61 - 1.24 mg/dL Final  12/23/2020 03:24 PM 3.72 (H) 0.61 - 1.24 mg/dL Final  12/22/2020 03:24 PM 3.76 (H) 0.40 - 1.50 mg/dL Final  08/15/2020 10:03 AM 3.31 (H) 0.61 - 1.24 mg/dL Final  06/19/2020 09:36 AM 3.16 (HH) 0.76 - 1.27 mg/dL Final  03/28/2020 12:14 PM 3.40 (H)  0.61 - 1.24 mg/dL Final  03/07/2020 10:31 AM 3.33 (H) 0.61 - 1.24 mg/dL Final  11/05/2019 12:30 PM 2.52 (H) 0.61 - 1.24 mg/dL Final  10/03/2019 08:36 AM 3.05 (H) 0.76 - 1.27 mg/dL Final  09/26/2019 09:16 AM 2.70 (H) 0.61 - 1.24 mg/dL Final  09/25/2019 05:45 AM 2.49 (H) 0.61 - 1.24 mg/dL Final  09/24/2019 05:01 AM 2.39 (H) 0.61 - 1.24 mg/dL Final  09/23/2019 11:38 PM 2.45 (H) 0.61 - 1.24 mg/dL Final  09/17/2019 05:27 PM 2.24 (H) 0.76 - 1.27 mg/dL Final  09/14/2019 03:55 PM 2.25 (H) 0.76 - 1.27 mg/dL Final  09/03/2019 09:04 AM 2.36 (H) 0.76 - 1.27 mg/dL Final  08/27/2019 09:43 AM 1.99 (H) 0.76 - 1.27 mg/dL Final  08/16/2019 02:44 PM 2.18 (H) 0.76 - 1.27 mg/dL Final  07/20/2019 02:06 PM 2.47 (H) 0.76 - 1.27 mg/dL Final    PMH:    Past Medical History:  Diagnosis Date   Anemia    CHF (congestive heart failure) ([place])    Chronic kidney disease    Depression    Diabetes mellitus without complication (HCC)    Type II   GERD (gastroesophageal reflux disease)    History of blood transfusion    Hyperlipidemia    Hypertension    Thyroid disease    was on supplement, taken off by [name]    [facility]:   Past Surgical History:  Procedure Laterality Date   APPENDECTOMY     AV FISTULA PLACEMENT Right 09/04/2021   Procedure: RIGHT BRACHIOCEPHALIC ARTERIOVENOUS (AV) FISTULA CREATION;  Surgeon: [name], MD;  Location: [place];  Service: Vascular;  Laterality: Right;   BIOPSY  12/25/2020   Procedure: BIOPSY;  Surgeon: [name]., MD;  Location: [facility] ENDOSCOPY;  Service: Gastroenterology;;   CARDIAC CATHETERIZATION N/A 01/30/2016   Procedure: Left Heart Cath and Coronary Angiography;  Surgeon: [name], MD;  Location: [place] CV LAB;  Service: Cardiovascular;  Laterality: N/A;   ENTEROSCOPY N/A 12/25/2020   Procedure: ENTEROSCOPY;  Surgeon: [name], MD;  Location: [place];  Service: Gastroenterology;  Laterality: N/A;   GIVENS CAPSULE STUDY N/A 12/25/2020   Procedure: GIVENS CAPSULE STUDY;  Surgeon: [name]., MD;  Location: [place];  Service: Gastroenterology;  Laterality: N/A;   SUBMUCOSAL TATTOO INJECTION  12/25/2020   Procedure: SUBMUCOSAL TATTOO INJECTION;  Surgeon: [name]., MD;  Location: [place];  Service: Gastroenterology;;    Allergies:  Allergies  Allergen Reactions   Pork-Derived Products Other (See Comments)    Patient "just does not eat this"    Medications:   Prior to Admission medications   Medication Sig Start Date End Date Taking? Authorizing Provider  amiodarone (PACERONE) 200 MG tablet Take 1 tablet (200 mg total) by mouth daily. Follow up appt required for further refills. Please call [phone] to schedule  an appt Patient taking differently: Take 200 mg by mouth daily. 11/30/21  Yes Clegg, Amy D, NP  atorvastatin (LIPITOR) 80 MG tablet TAKE 1 TABLET(80 MG) BY MOUTH DAILY Patient taking differently: Take 80 mg by mouth daily. 12/22/20  Yes [name], MD  CALPHRON 667 MG tablet Take 667 mg by mouth 3 (three) times daily. 11/09/21  Yes [provider]  Cholecalciferol (VITAMIN D3 PO) Take 1 tablet by mouth daily.   Yes [provider]  citalopram (CELEXA) 20 MG tablet TAKE 1 TABLET(20 MG) BY MOUTH DAILY Patient taking differently: Take 20 mg by mouth daily. 11/16/21  Yes  [name], MD  diltiazem (CARDIZEM CD) 180 MG 24 hr capsule TAKE 1 CAPSULE(180 MG) BY MOUTH DAILY Patient taking differently: Take 180 mg by mouth daily. 08/24/21  Yes Bensimhon, Shaune Pascal, MD  ELIQUIS 2.5 MG TABS tablet TAKE 1 TABLET(2.5 MG) BY MOUTH TWICE DAILY Patient taking differently: Take 2.5 mg by mouth 2 (two) times daily. 10/30/21  Yes Bensimhon, Shaune Pascal, MD  glipiZIDE (GLUCOTROL XL) 2.5 MG 24 hr tablet Take 1 tablet (2.5 mg total) by mouth daily with breakfast. With $RemoveBeforeD'5mg'qpdtaezDhGLdur$  for total dose of 7.$RemoveB'5mg'SVkrykUl$  qd. 11/26/21  Yes [name], MD  glipiZIDE (GLUCOTROL XL) 5 MG 24 hr tablet Take 1 tablet (5 mg total) by mouth daily with breakfast. 11/22/21  Yes Domenic Polite, MD  polyethylene glycol (MIRALAX / GLYCOLAX) 17 g packet Take 17 g by mouth daily as needed for mild constipation or moderate constipation (MIX AND DRINK).   Yes [provider]  sitaGLIPtin (JANUVIA) 25 MG tablet TAKE 1 TABLET(25 MG) BY MOUTH DAILY Patient taking differently: Take 25 mg by mouth daily. 08/31/21  Yes [name], MD  tamsulosin (FLOMAX) 0.4 MG CAPS capsule Take 0.4 mg by mouth daily.   Yes [provider]  torsemide (DEMADEX) 20 MG tablet Take 2 tablets (40 mg total) by mouth 2 (two) times daily. 10/05/21  Yes Milford, Maricela Bo, FNP  vitamin B-12 (CYANOCOBALAMIN) 500 MCG tablet Take 500 mcg by mouth  daily.   Yes [provider]  blood glucose meter kit and supplies Use up to two times daily as directed  ICD10 E10.9 E11.9 02/20/19   [name], MD  GlucoCom Lancets MISC Use for home glucose monitoring 09/02/15   Harrison Mons, PA  glucose blood (ONETOUCH VERIO) test strip Test up to 2 times per day.  Uncontrolled diabetes with hyperglycemia and stage 4 CKD. 04/13/19   [name], MD    Inpatient medications:  amiodarone  200 mg Oral Daily   apixaban  2.5 mg Oral BID   atorvastatin  80 mg Oral Daily   calcium acetate  667 mg Oral TID with meals   Chlorhexidine Gluconate Cloth  6 each Topical Q0600   citalopram  20 mg Oral Daily   diltiazem  180 mg Oral Daily   finasteride  5 mg Oral Daily   furosemide  80 mg Intravenous BID   insulin aspart  0-9 Units Subcutaneous TID WC   linagliptin  5 mg Oral Daily   metolazone  2.5 mg Oral Daily   sodium bicarbonate  650 mg Oral BID   sodium chloride flush  3 mL Intravenous Q12H   tamsulosin  0.4 mg Oral Daily    Discontinued Meds:   Medications Discontinued During This Encounter  Medication Reason   cefTRIAXone (ROCEPHIN) 1 g in sodium chloride 0.9 % 100 mL IVPB    phenazopyridine (PYRIDIUM) tablet 100 mg    dapagliflozin propanediol (FARXIGA) 5 MG TABS tablet Discontinued by provider   sodium bicarbonate 650 MG tablet Change in therapy    Social History:  reports that he quit smoking about 2 years ago. His smoking use included cigarettes. He has a 4.80 pack-year smoking history. He has never used smokeless tobacco. He reports that he does not drink alcohol and does not use drugs.  Family History:   Family History  Problem Relation Age of Onset   Hypertension Mother    Hyperlipidemia Mother    Hypertension Sister    Kidney disease Father  Heart disease Brother 65       open heart surgery   Colon cancer Neg Hx     Pertinent items are noted in HPI. Weight change:   Intake/Output Summary (Last 24 hours)  at 12/14/2021 1443 Last data filed at 12/14/2021 0410 Gross per 24 hour  Intake 435 ml  Output 1820 ml  Net -1385 ml   BP (!) 136/56   Pulse 86   Temp 97.6 F (36.4 C) (Oral)   Resp 20   Ht 5\' 6"  (1.676 m)   Wt 52.1 kg   SpO2 92%   BMI 18.54 kg/m  Vitals:   12/14/21 0417 12/14/21 0741 12/14/21 0800 12/14/21 1117  BP: (!) 152/66 (!) 144/68  (!) 136/56  Pulse: 82  86   Resp: 16  17 20   Temp: 97.6 F (36.4 C)     TempSrc: Oral     SpO2: 97%  92%   Weight: 52.1 kg     Height:         General appearance: alert, cooperative, and no distress Head: Normocephalic, without obvious abnormality, atraumatic Eyes: negative findings: lids and lashes normal, conjunctivae and sclerae normal, and corneas clear Resp: clear to auscultation bilaterally Cardio: regular rate and rhythm, S1, S2 normal, no murmur, click, rub or gallop GI: soft, non-tender; bowel sounds normal; no masses,  no organomegaly Extremities: extremities normal, atraumatic, no cyanosis or edema and RUE AVF +T/B  Labs: Basic Metabolic Panel: Recent Labs  Lab 12/10/21 1339 12/13/21 1011 12/14/21 0347  NA 141 137 138  K 3.7 3.7 3.7  CL 102 100 104  CO2 21 21* 22  GLUCOSE 98 290* 169*  BUN 110* 106* 104*  CREATININE 4.85* 4.75* 4.49*  CALCIUM 8.8 8.6* 8.5*   Liver Function Tests: No results for input(s): AST, ALT, ALKPHOS, BILITOT, PROT, ALBUMIN in the last 168 hours. No results for input(s): LIPASE, AMYLASE in the last 168 hours. No results for input(s): AMMONIA in the last 168 hours. CBC: Recent Labs  Lab 12/13/21 1011  WBC 12.4*  NEUTROABS 11.3*  HGB 9.6*  HCT 29.5*  MCV 84.5  PLT 386   PT/INR: @LABRCNTIP (inr:5) Cardiac Enzymes: ) Recent Labs  Lab 12/13/21 1914  CKTOTAL 93   CBG: Recent Labs  Lab 12/14/21 0045 12/14/21 0636 12/14/21 1115  GLUCAP 210* 170* 295*    Iron Studies: No results for input(s): IRON, TIBC, TRANSFERRIN, FERRITIN in the last 168 hours.  Xrays/Other Studies: CT  ABDOMEN PELVIS WO CONTRAST  Result Date: 12/13/2021 CLINICAL DATA:  76 year old male with history of abnormal chest x-ray. Pneumonia. Pleural effusion. Suspected malignancy. EXAM: CT CHEST, ABDOMEN AND PELVIS WITHOUT CONTRAST TECHNIQUE: Multidetector CT imaging of the chest, abdomen and pelvis was performed following the standard protocol without IV contrast. RADIATION DOSE REDUCTION: This exam was performed according to the departmental dose-optimization program which includes automated exposure control, adjustment of the mA and/or kV according to patient size and/or use of iterative reconstruction technique. COMPARISON:  Chest CT 11/15/2021. CT the abdomen and pelvis 11/17/2021. FINDINGS: Comment: Today's study is severely limited for detection and characterization of visceral and/or vascular lesions by lack of IV contrast. CT CHEST FINDINGS Cardiovascular: Heart size is mildly enlarged. There is no significant pericardial fluid, thickening or pericardial calcification. There is aortic atherosclerosis, as well as atherosclerosis of the great vessels of the mediastinum and the coronary arteries, including calcified atherosclerotic plaque in the left main, left anterior descending, left circumflex and right coronary arteries. Mediastinum/Nodes: Multiple prominent borderline  enlarged mediastinal lymph nodes are noted, nonspecific, but similar to the recent prior study, favored to be reactive. No definite pathologically enlarged mediastinal or hilar lymph nodes are confidently identified on today's noncontrast CT examination. Esophagus is unremarkable in appearance. No axillary lymphadenopathy. Lungs/Pleura: Moderate right chronic partially loculated pleural effusion and small left chronic partially loculated pleural effusion, both of which are stable compared to the prior study. Both of these are associated with extensive areas of pleuroparenchymal thickening and nodular/mass-like architectural distortion in the  lungs bilaterally, with associated pleural tails", most compatible with extensive areas of rounded atelectasis. There continues to be some patchy multifocal ground-glass attenuation in the lungs bilaterally, some of which appears improved compared to the prior study, but other areas have clearly increased compared to the prior examination, with some associated septal thickening, most evident in the anterior aspect of the right upper lobe (best appreciated on axial image 58 of series 3). Musculoskeletal: There are no aggressive appearing lytic or blastic lesions noted in the visualized portions of the skeleton. CT ABDOMEN PELVIS FINDINGS Hepatobiliary: No definite suspicious appearing hepatic lesions are confidently identified on today's noncontrast CT examination. Unenhanced appearance of the gallbladder is unremarkable. Pancreas: No definite pancreatic mass or peripancreatic fluid collections or inflammatory changes are confidently identified on today's noncontrast CT examination. Spleen: Unremarkable. Adrenals/Urinary Tract: Low-attenuation lesions in the upper pole of the right kidney measuring up to 2.9 x 2.3 cm, incompletely characterized on today's non-contrast CT examination, but statistically likely to represent cysts. Other subcentimeter high attenuation lesions are noted in the left kidney, incompletely characterized, but favored to represent proteinaceous/hemorrhagic cysts. Low-attenuation adreniform thickening bilaterally, favored to represent adenomatous hyperplasia. Large left retroperitoneal mass (discussed below) which is favored to be separate from the left adrenal gland, although this does make contact with the anterior aspect of the left adrenal gland such that an adrenal lesion is not entirely excluded. No hydroureteronephrosis. Urinary bladder is markedly distended, but otherwise unremarkable in appearance on today's noncontrast CT examination. Stomach/Bowel: The appearance of the stomach is  normal. No pathologic dilatation of small bowel or colon. The appendix is not confidently identified and may be surgically absent. Regardless, there are no inflammatory changes noted adjacent to the cecum to suggest the presence of an acute appendicitis at this time. Vascular/Lymphatic: Aortic atherosclerosis. Large left para-aortic retroperitoneal mass could represent malignant centrally necrotic lymphadenopathy, however, this is favored to represent a primary retroperitoneal lesion (see discussion below). No other lymphadenopathy noted elsewhere in the abdomen or pelvis. Reproductive: Prostate gland and seminal vesicles are unremarkable in appearance. Other: Again noted is a large left-sided para-aortic retroperitoneal mass measuring approximately 6.9 x 6.1 x 7.9 cm (axial image 27 of series 2 and coronal image 38 of series 5), previously 6.7 x 5.4 x 6.9 cm). This is intimately associated with the adjacent structures, including the abdominal aorta left renal artery, proximal superior mesenteric artery, anterior aspect of the left adrenal gland, and multiple small bowel loops. The lesion appears separate from the pancreas. The lesion is heterogeneous in attenuation with some central areas that are lower attenuation (21 HU) and peripheral intermediate attenuation (38 HU). No significant volume of ascites. No pneumoperitoneum. Musculoskeletal: There are no aggressive appearing lytic or blastic lesions noted in the visualized portions of the skeleton. IMPRESSION: 1. 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 This may represent a primary retroperitoneal neoplasm, but differential  considerations are very broad given the location, and include malignant lymphadenopathy, vascular lesions, lesions of adrenal origin, gastrointestinal lesion of the small bowel, etc. Further evaluation with  abdominal MRI with and without IV gadolinium is recommended to better characterize this finding. 2. Shifting pattern of ground-glass attenuation and septal thickening in the lungs is of uncertain etiology and significance, but is overall slightly worsened and likely of infectious or inflammatory etiology. Infiltrative neoplasm such as metastatic adenocarcinoma is difficult to entirely exclude. 3. Chronic bilateral pleural effusions with extensive rounded atelectasis in both lungs, similar to the prior study. 4. Mild cardiomegaly. 5. Aortic atherosclerosis, in addition to left main and three-vessel coronary artery disease. Assessment for potential risk factor modification, dietary therapy or pharmacologic therapy may be warranted, if clinically indicated. 6. Additional incidental findings, as above. Electronically Signed   By: Vinnie Langton M.D.   On: 12/13/2021 13:31   CT Chest Wo Contrast  Result Date: 12/13/2021 CLINICAL DATA:  76 year old male with history of abnormal chest x-ray. Pneumonia. Pleural effusion. Suspected malignancy. EXAM: CT CHEST, ABDOMEN AND PELVIS WITHOUT CONTRAST TECHNIQUE: Multidetector CT imaging of the chest, abdomen and pelvis was performed following the standard protocol without IV contrast. RADIATION DOSE REDUCTION: This exam was performed according to the departmental dose-optimization program which includes automated exposure control, adjustment of the mA and/or kV according to patient size and/or use of iterative reconstruction technique. COMPARISON:  Chest CT 11/15/2021. CT the abdomen and pelvis 11/17/2021. FINDINGS: Comment: Today's study is severely limited for detection and characterization of visceral and/or vascular lesions by lack of IV contrast. CT CHEST FINDINGS Cardiovascular: Heart size is mildly enlarged. There is no significant pericardial fluid, thickening or pericardial calcification. There is aortic atherosclerosis, as well as atherosclerosis of the great vessels  of the mediastinum and the coronary arteries, including calcified atherosclerotic plaque in the left main, left anterior descending, left circumflex and right coronary arteries. Mediastinum/Nodes: Multiple prominent borderline enlarged mediastinal lymph nodes are noted, nonspecific, but similar to the recent prior study, favored to be reactive. No definite pathologically enlarged mediastinal or hilar lymph nodes are confidently identified on today's noncontrast CT examination. Esophagus is unremarkable in appearance. No axillary lymphadenopathy. Lungs/Pleura: Moderate right chronic partially loculated pleural effusion and small left chronic partially loculated pleural effusion, both of which are stable compared to the prior study. Both of these are associated with extensive areas of pleuroparenchymal thickening and nodular/mass-like architectural distortion in the lungs bilaterally, with associated pleural tails", most compatible with extensive areas of rounded atelectasis. There continues to be some patchy multifocal ground-glass attenuation in the lungs bilaterally, some of which appears improved compared to the prior study, but other areas have clearly increased compared to the prior examination, with some associated septal thickening, most evident in the anterior aspect of the right upper lobe (best appreciated on axial image 58 of series 3). Musculoskeletal: There are no aggressive appearing lytic or blastic lesions noted in the visualized portions of the skeleton. CT ABDOMEN PELVIS FINDINGS Hepatobiliary: No definite suspicious appearing hepatic lesions are confidently identified on today's noncontrast CT examination. Unenhanced appearance of the gallbladder is unremarkable. Pancreas: No definite pancreatic mass or peripancreatic fluid collections or inflammatory changes are confidently identified on today's noncontrast CT examination. Spleen: Unremarkable. Adrenals/Urinary Tract: Low-attenuation lesions in  the upper pole of the right kidney measuring up to 2.9 x 2.3 cm, incompletely characterized on today's non-contrast CT examination, but statistically likely to represent cysts. Other subcentimeter high attenuation lesions are noted in the left  kidney, incompletely characterized, but favored to represent proteinaceous/hemorrhagic cysts. Low-attenuation adreniform thickening bilaterally, favored to represent adenomatous hyperplasia. Large left retroperitoneal mass (discussed below) which is favored to be separate from the left adrenal gland, although this does make contact with the anterior aspect of the left adrenal gland such that an adrenal lesion is not entirely excluded. No hydroureteronephrosis. Urinary bladder is markedly distended, but otherwise unremarkable in appearance on today's noncontrast CT examination. Stomach/Bowel: The appearance of the stomach is normal. No pathologic dilatation of small bowel or colon. The appendix is not confidently identified and may be surgically absent. Regardless, there are no inflammatory changes noted adjacent to the cecum to suggest the presence of an acute appendicitis at this time. Vascular/Lymphatic: Aortic atherosclerosis. Large left para-aortic retroperitoneal mass could represent malignant centrally necrotic lymphadenopathy, however, this is favored to represent a primary retroperitoneal lesion (see discussion below). No other lymphadenopathy noted elsewhere in the abdomen or pelvis. Reproductive: Prostate gland and seminal vesicles are unremarkable in appearance. Other: Again noted is a large left-sided para-aortic retroperitoneal mass measuring approximately 6.9 x 6.1 x 7.9 cm (axial image 27 of series 2 and coronal image 38 of series 5), previously 6.7 x 5.4 x 6.9 cm). This is intimately associated with the adjacent structures, including the abdominal aorta left renal artery, proximal superior mesenteric artery, anterior aspect of the left adrenal gland, and  multiple small bowel loops. The lesion appears separate from the pancreas. The lesion is heterogeneous in attenuation with some central areas that are lower attenuation (21 HU) and peripheral intermediate attenuation (38 HU). No significant volume of ascites. No pneumoperitoneum. Musculoskeletal: There are no aggressive appearing lytic or blastic lesions noted in the visualized portions of the skeleton. IMPRESSION: 1. 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 This may represent a primary retroperitoneal neoplasm, but differential considerations are very broad given the location, and include malignant lymphadenopathy, vascular lesions, lesions of adrenal origin, gastrointestinal lesion of the small bowel, etc. Further evaluation with abdominal MRI with and without IV gadolinium is recommended to better characterize this finding. 2. Shifting pattern of ground-glass attenuation and septal thickening in the lungs is of uncertain etiology and significance, but is overall slightly worsened and likely of infectious or inflammatory etiology. Infiltrative neoplasm such as metastatic adenocarcinoma is difficult to entirely exclude. 3. Chronic bilateral pleural effusions with extensive rounded atelectasis in both lungs, similar to the prior study. 4. Mild cardiomegaly. 5. Aortic atherosclerosis, in addition to left main and three-vessel coronary artery disease. Assessment for potential risk factor modification, dietary therapy or pharmacologic therapy may be warranted, if clinically indicated. 6. Additional incidental findings, as above. Electronically Signed   By: Vinnie Langton M.D.   On: 12/13/2021 13:31   DG Chest Portable 1 View  Result Date: 12/13/2021 CLINICAL DATA:  Shortness of breath.  Renal failure. EXAM: PORTABLE CHEST 1 VIEW COMPARISON:  12/10/2021 radiograph And prior  studies FINDINGS: Cardiomediastinal silhouette is unchanged. Pulmonary vascular congestion again noted. Moderate RIGHT pleural effusion and small to moderate LEFT pleural effusion are again noted. Unchanged bilateral pulmonary opacities/scarring again identified. Nodular opacity overlying the RIGHT UPPER lung appears slightly more prominent which is likely technical. There is no evidence of pneumothorax or acute bony abnormality. IMPRESSION: Little significant change in appearance of the chest with pulmonary vascular congestion, bilateral pleural effusions and unchanged bilateral pulmonary opacities. Electronically Signed   By: Dellis Filbert  Hu M.D.   On: 12/13/2021 10:49     Assessment/Plan:  AKI/CKD stage IV - presumably combination of urinary retention and cardiorenal syndrome in the setting of acute on chronic CHF.  BUN/Cr are slowly improving with diuresis and foley catheter.  No indication for hemodialysis.  He normally follows with Dr. [name] at [facility] and will need hospital follow up after discharge.  Avoid nephrotoxic agents such as IV contrast, NSAIDs, phosphorous containing bowel preps.  Renal dose meds. Continue to follow UOP and daily Scr.  Acute on chronic diastolic CHF - improved with IV lasix 80 mg bid.  Palliative care following for worsening cardiac function and FTT. P. 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  Hold off on ESA give recent diagnosis of paraganglioma.  Will defer management to Oncology.    Adam Reid 12/14/2021, 2:43 PM

## 2021-12-14 NOTE — Plan of Care (Signed)
  Problem: Cardiac: Goal: Ability to achieve and maintain adequate cardiopulmonary perfusion will improve Outcome: Not Progressing   Problem: Elimination: Goal: Will not experience complications related to urinary retention Outcome: Not Progressing

## 2021-12-15 DIAGNOSIS — Z7189 Other specified counseling: Secondary | ICD-10-CM | POA: Diagnosis not present

## 2021-12-15 DIAGNOSIS — N179 Acute kidney failure, unspecified: Secondary | ICD-10-CM | POA: Diagnosis not present

## 2021-12-15 DIAGNOSIS — D447 Neoplasm of uncertain behavior of aortic body and other paraganglia: Secondary | ICD-10-CM

## 2021-12-15 DIAGNOSIS — I5031 Acute diastolic (congestive) heart failure: Secondary | ICD-10-CM | POA: Diagnosis not present

## 2021-12-15 DIAGNOSIS — N189 Chronic kidney disease, unspecified: Secondary | ICD-10-CM

## 2021-12-15 DIAGNOSIS — I130 Hypertensive heart and chronic kidney disease with heart failure and stage 1 through stage 4 chronic kidney disease, or unspecified chronic kidney disease: Secondary | ICD-10-CM | POA: Diagnosis not present

## 2021-12-15 LAB — GLUCOSE, CAPILLARY
Glucose-Capillary: 163 mg/dL — ABNORMAL HIGH (ref 70–99)
Glucose-Capillary: 93 mg/dL (ref 70–99)
Glucose-Capillary: 316 mg/dL — ABNORMAL HIGH (ref 70–99)
Glucose-Capillary: 128 mg/dL — ABNORMAL HIGH (ref 70–99)

## 2021-12-15 LAB — BASIC METABOLIC PANEL
Anion gap: 12 (ref 5–15)
BUN: 99 mg/dL — ABNORMAL HIGH (ref 8–23)
CO2: 22 mmol/L (ref 22–32)
Calcium: 8.3 mg/dL — ABNORMAL LOW (ref 8.9–10.3)
Chloride: 99 mmol/L (ref 98–111)
Creatinine, Ser: 4.6 mg/dL — ABNORMAL HIGH (ref 0.61–1.24)
GFR, Estimated: 13 mL/min — ABNORMAL LOW (ref >60)
Glucose, Bld: 88 mg/dL (ref 70–99)
Potassium: 4.2 mmol/L (ref 3.5–5.1)
Sodium: 133 mmol/L — ABNORMAL LOW (ref 135–145)

## 2021-12-15 LAB — URINE CULTURE: Culture: NO GROWTH

## 2021-12-15 MED ORDER — METOLAZONE 5 MG PO TABS
5.0000 mg | ORAL_TABLET | Freq: Every day | ORAL | Status: DC
Start: 2021-12-16 — End: 2021-12-15

## 2021-12-15 MED ORDER — LIDOCAINE HCL URETHRAL/MUCOSAL 2 % EX GEL
1.0000 "application " | Freq: Once | CUTANEOUS | Status: AC
  Administered 2021-12-15: 1 via URETHRAL
  Filled 2021-12-15: qty 6

## 2021-12-15 MED ORDER — METOLAZONE 2.5 MG PO TABS
2.5000 mg | ORAL_TABLET | Freq: Once | ORAL | Status: AC
  Administered 2021-12-15: 2.5 mg via ORAL
  Filled 2021-12-15: qty 1

## 2021-12-15 MED ORDER — DOXAZOSIN MESYLATE 8 MG PO TABS
4.0000 mg | ORAL_TABLET | Freq: Every day | ORAL | Status: DC
  Administered 2021-12-15 – 2021-12-31 (×17): 4 mg via ORAL
  Filled 2021-12-15 (×17): qty 1

## 2021-12-15 MED ORDER — HYDROMORPHONE HCL 2 MG PO TABS
2.0000 mg | ORAL_TABLET | ORAL | Status: DC | PRN
  Administered 2021-12-15 – 2021-12-22 (×9): 2 mg via ORAL
  Filled 2021-12-15 (×10): qty 1

## 2021-12-15 MED ORDER — CHLORHEXIDINE GLUCONATE CLOTH 2 % EX PADS
6.0000 | MEDICATED_PAD | Freq: Every day | CUTANEOUS | Status: DC
  Administered 2021-12-15 – 2021-12-31 (×17): 6 via TOPICAL

## 2021-12-15 MED ORDER — METOLAZONE 5 MG PO TABS
5.0000 mg | ORAL_TABLET | Freq: Every day | ORAL | Status: DC
  Administered 2021-12-16 – 2021-12-21 (×6): 5 mg via ORAL
  Filled 2021-12-15 (×6): qty 1

## 2021-12-15 MED ORDER — SENNA 8.6 MG PO TABS
1.0000 | ORAL_TABLET | Freq: Every day | ORAL | Status: DC | PRN
Start: 2021-12-15 — End: 2021-12-19
  Filled 2021-12-15: qty 1

## 2021-12-15 MED ORDER — TAMSULOSIN HCL 0.4 MG PO CAPS
0.8000 mg | ORAL_CAPSULE | Freq: Every day | ORAL | Status: DC
  Administered 2021-12-15: 0.8 mg via ORAL
  Filled 2021-12-15: qty 2

## 2021-12-15 MED ORDER — SODIUM CHLORIDE 0.9 % IV SOLN
250.0000 mg | Freq: Every day | INTRAVENOUS | Status: AC
  Administered 2021-12-15 – 2021-12-16 (×2): 250 mg via INTRAVENOUS
  Filled 2021-12-15 (×2): qty 20

## 2021-12-15 MED ORDER — FUROSEMIDE 10 MG/ML IJ SOLN: 80.0000 mg | Freq: Every day | INTRAMUSCULAR | Status: DC

## 2021-12-15 NOTE — Progress Notes (Signed)
Patient ID: Adam Reid, male   DOB: 13-Sep-1945, 76 y.o.   MRN: [id number] S: Wife at bedside and acted as Optometrist.  No events overnight and overall feeling better but has not been able to urinate since foley catheter was removed. O:BP (!) 162/84 (BP Location: Left Wrist)   Pulse 70   Temp 97.6 F (36.4 C) (Oral)   Resp 18   Ht '5\' 6"'$  (1.676 m)   Wt 52.1 kg   SpO2 94%   BMI 18.54 kg/m   Intake/Output Summary (Last 24 hours) at 12/15/2021 1056 Last data filed at 12/15/2021 0547 Gross per 24 hour  Intake 440 ml  Output 950 ml  Net -510 ml   Intake/Output: I/O last 3 completed shifts: In: 1815 [P.O.:1715; IV Piggyback:100] Out: 2095 [Urine:2095]  Intake/Output this shift:  No intake/output data recorded. Weight change: -0.8 kg Gen:NAD CVS: RRR Resp:CTA Abd: +BS, soft, Nt/ND Ext: no edema  Recent Labs  Lab 12/10/21 1339 12/13/21 1011 12/14/21 0347 12/15/21 0426  NA 141 137 138 133*  K 3.7 3.7 3.7 4.2  CL 102 100 104 99  CO2 21 21* 22 22  GLUCOSE 98 290* 169* 88  BUN 110* 106* 104* 99*  CREATININE 4.85* 4.75* 4.49* 4.60*  CALCIUM 8.8 8.6* 8.5* 8.3*   Liver Function Tests: No results for input(s): AST, ALT, ALKPHOS, BILITOT, PROT, ALBUMIN in the last 168 hours. No results for input(s): LIPASE, AMYLASE in the last 168 hours. No results for input(s): AMMONIA in the last 168 hours. CBC: Recent Labs  Lab 12/13/21 1011  WBC 12.4*  NEUTROABS 11.3*  HGB 9.6*  HCT 29.5*  MCV 84.5  PLT 386   Cardiac Enzymes: Recent Labs  Lab 12/13/21 1914  CKTOTAL 93   CBG: Recent Labs  Lab 12/14/21 1620 12/14/21 1632 12/14/21 2122 12/15/21 0130 12/15/21 0614  GLUCAP 35* 173* 259* 163* 93    Iron Studies: No results for input(s): IRON, TIBC, TRANSFERRIN, FERRITIN in the last 72 hours. Studies/Results: CT ABDOMEN PELVIS WO CONTRAST  Result Date: 12/13/2021 CLINICAL DATA:  76 year old male with history of abnormal chest x-ray. Pneumonia. Pleural effusion. Suspected  malignancy. EXAM: CT CHEST, ABDOMEN AND PELVIS WITHOUT CONTRAST TECHNIQUE: Multidetector CT imaging of the chest, abdomen and pelvis was performed following the standard protocol without IV contrast. RADIATION DOSE REDUCTION: This exam was performed according to the departmental dose-optimization program which includes automated exposure control, adjustment of the mA and/or kV according to patient size and/or use of iterative reconstruction technique. COMPARISON:  Chest CT 11/15/2021. CT the abdomen and pelvis 11/17/2021. FINDINGS: Comment: Today's study is severely limited for detection and characterization of visceral and/or vascular lesions by lack of IV contrast. CT CHEST FINDINGS Cardiovascular: Heart size is mildly enlarged. There is no significant pericardial fluid, thickening or pericardial calcification. There is aortic atherosclerosis, as well as atherosclerosis of the great vessels of the mediastinum and the coronary arteries, including calcified atherosclerotic plaque in the left main, left anterior descending, left circumflex and right coronary arteries. Mediastinum/Nodes: Multiple prominent borderline enlarged mediastinal lymph nodes are noted, nonspecific, but similar to the recent prior study, favored to be reactive. No definite pathologically enlarged mediastinal or hilar lymph nodes are confidently identified on today's noncontrast CT examination. Esophagus is unremarkable in appearance. No axillary lymphadenopathy. Lungs/Pleura: Moderate right chronic partially loculated pleural effusion and small left chronic partially loculated pleural effusion, both of which are stable compared to the prior study. Both of these are associated with extensive areas of pleuroparenchymal thickening  and nodular/mass-like architectural distortion in the lungs bilaterally, with associated pleural tails", most compatible with extensive areas of rounded atelectasis. There continues to be some patchy multifocal  ground-glass attenuation in the lungs bilaterally, some of which appears improved compared to the prior study, but other areas have clearly increased compared to the prior examination, with some associated septal thickening, most evident in the anterior aspect of the right upper lobe (best appreciated on axial image 58 of series 3). Musculoskeletal: There are no aggressive appearing lytic or blastic lesions noted in the visualized portions of the skeleton. CT ABDOMEN PELVIS FINDINGS Hepatobiliary: No definite suspicious appearing hepatic lesions are confidently identified on today's noncontrast CT examination. Unenhanced appearance of the gallbladder is unremarkable. Pancreas: No definite pancreatic mass or peripancreatic fluid collections or inflammatory changes are confidently identified on today's noncontrast CT examination. Spleen: Unremarkable. Adrenals/Urinary Tract: Low-attenuation lesions in the upper pole of the right kidney measuring up to 2.9 x 2.3 cm, incompletely characterized on today's non-contrast CT examination, but statistically likely to represent cysts. Other subcentimeter high attenuation lesions are noted in the left kidney, incompletely characterized, but favored to represent proteinaceous/hemorrhagic cysts. Low-attenuation adreniform thickening bilaterally, favored to represent adenomatous hyperplasia. Large left retroperitoneal mass (discussed below) which is favored to be separate from the left adrenal gland, although this does make contact with the anterior aspect of the left adrenal gland such that an adrenal lesion is not entirely excluded. No hydroureteronephrosis. Urinary bladder is markedly distended, but otherwise unremarkable in appearance on today's noncontrast CT examination. Stomach/Bowel: The appearance of the stomach is normal. No pathologic dilatation of small bowel or colon. The appendix is not confidently identified and may be surgically absent. Regardless, there are no  inflammatory changes noted adjacent to the cecum to suggest the presence of an acute appendicitis at this time. Vascular/Lymphatic: Aortic atherosclerosis. Large left para-aortic retroperitoneal mass could represent malignant centrally necrotic lymphadenopathy, however, this is favored to represent a primary retroperitoneal lesion (see discussion below). No other lymphadenopathy noted elsewhere in the abdomen or pelvis. Reproductive: Prostate gland and seminal vesicles are unremarkable in appearance. Other: Again noted is a large left-sided para-aortic retroperitoneal mass measuring approximately 6.9 x 6.1 x 7.9 cm (axial image 27 of series 2 and coronal image 38 of series 5), previously 6.7 x 5.4 x 6.9 cm). This is intimately associated with the adjacent structures, including the abdominal aorta left renal artery, proximal superior mesenteric artery, anterior aspect of the left adrenal gland, and multiple small bowel loops. The lesion appears separate from the pancreas. The lesion is heterogeneous in attenuation with some central areas that are lower attenuation (21 HU) and peripheral intermediate attenuation (38 HU). No significant volume of ascites. No pneumoperitoneum. Musculoskeletal: There are no aggressive appearing lytic or blastic lesions noted in the visualized portions of the skeleton. IMPRESSION: 1. 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 This may represent a primary retroperitoneal neoplasm, but differential considerations are very broad given the location, and include malignant lymphadenopathy, vascular lesions, lesions of adrenal origin, gastrointestinal lesion of the small bowel, etc. Further evaluation with abdominal MRI with and without IV gadolinium is recommended to better characterize this finding. 2. Shifting pattern of ground-glass attenuation and septal  thickening in the lungs is of uncertain etiology and significance, but is overall slightly worsened and likely of infectious or inflammatory etiology. Infiltrative neoplasm such as metastatic adenocarcinoma is difficult  to entirely exclude. 3. Chronic bilateral pleural effusions with extensive rounded atelectasis in both lungs, similar to the prior study. 4. Mild cardiomegaly. 5. Aortic atherosclerosis, in addition to left main and three-vessel coronary artery disease. Assessment for potential risk factor modification, dietary therapy or pharmacologic therapy may be warranted, if clinically indicated. 6. Additional incidental findings, as above. Electronically Signed   By: Vinnie Langton M.D.   On: 12/13/2021 13:31   CT Chest Wo Contrast  Result Date: 12/13/2021 CLINICAL DATA:  76 year old male with history of abnormal chest x-ray. Pneumonia. Pleural effusion. Suspected malignancy. EXAM: CT CHEST, ABDOMEN AND PELVIS WITHOUT CONTRAST TECHNIQUE: Multidetector CT imaging of the chest, abdomen and pelvis was performed following the standard protocol without IV contrast. RADIATION DOSE REDUCTION: This exam was performed according to the departmental dose-optimization program which includes automated exposure control, adjustment of the mA and/or kV according to patient size and/or use of iterative reconstruction technique. COMPARISON:  Chest CT 11/15/2021. CT the abdomen and pelvis 11/17/2021. FINDINGS: Comment: Today's study is severely limited for detection and characterization of visceral and/or vascular lesions by lack of IV contrast. CT CHEST FINDINGS Cardiovascular: Heart size is mildly enlarged. There is no significant pericardial fluid, thickening or pericardial calcification. There is aortic atherosclerosis, as well as atherosclerosis of the great vessels of the mediastinum and the coronary arteries, including calcified atherosclerotic plaque in the left main, left anterior descending, left circumflex and  right coronary arteries. Mediastinum/Nodes: Multiple prominent borderline enlarged mediastinal lymph nodes are noted, nonspecific, but similar to the recent prior study, favored to be reactive. No definite pathologically enlarged mediastinal or hilar lymph nodes are confidently identified on today's noncontrast CT examination. Esophagus is unremarkable in appearance. No axillary lymphadenopathy. Lungs/Pleura: Moderate right chronic partially loculated pleural effusion and small left chronic partially loculated pleural effusion, both of which are stable compared to the prior study. Both of these are associated with extensive areas of pleuroparenchymal thickening and nodular/mass-like architectural distortion in the lungs bilaterally, with associated pleural tails", most compatible with extensive areas of rounded atelectasis. There continues to be some patchy multifocal ground-glass attenuation in the lungs bilaterally, some of which appears improved compared to the prior study, but other areas have clearly increased compared to the prior examination, with some associated septal thickening, most evident in the anterior aspect of the right upper lobe (best appreciated on axial image 58 of series 3). Musculoskeletal: There are no aggressive appearing lytic or blastic lesions noted in the visualized portions of the skeleton. CT ABDOMEN PELVIS FINDINGS Hepatobiliary: No definite suspicious appearing hepatic lesions are confidently identified on today's noncontrast CT examination. Unenhanced appearance of the gallbladder is unremarkable. Pancreas: No definite pancreatic mass or peripancreatic fluid collections or inflammatory changes are confidently identified on today's noncontrast CT examination. Spleen: Unremarkable. Adrenals/Urinary Tract: Low-attenuation lesions in the upper pole of the right kidney measuring up to 2.9 x 2.3 cm, incompletely characterized on today's non-contrast CT examination, but statistically  likely to represent cysts. Other subcentimeter high attenuation lesions are noted in the left kidney, incompletely characterized, but favored to represent proteinaceous/hemorrhagic cysts. Low-attenuation adreniform thickening bilaterally, favored to represent adenomatous hyperplasia. Large left retroperitoneal mass (discussed below) which is favored to be separate from the left adrenal gland, although this does make contact with the anterior aspect of the left adrenal gland such that an adrenal lesion is not entirely excluded. No hydroureteronephrosis. Urinary bladder is markedly distended, but otherwise unremarkable in appearance on today's noncontrast CT examination. Stomach/Bowel: The appearance of the  stomach is normal. No pathologic dilatation of small bowel or colon. The appendix is not confidently identified and may be surgically absent. Regardless, there are no inflammatory changes noted adjacent to the cecum to suggest the presence of an acute appendicitis at this time. Vascular/Lymphatic: Aortic atherosclerosis. Large left para-aortic retroperitoneal mass could represent malignant centrally necrotic lymphadenopathy, however, this is favored to represent a primary retroperitoneal lesion (see discussion below). No other lymphadenopathy noted elsewhere in the abdomen or pelvis. Reproductive: Prostate gland and seminal vesicles are unremarkable in appearance. Other: Again noted is a large left-sided para-aortic retroperitoneal mass measuring approximately 6.9 x 6.1 x 7.9 cm (axial image 27 of series 2 and coronal image 38 of series 5), previously 6.7 x 5.4 x 6.9 cm). This is intimately associated with the adjacent structures, including the abdominal aorta left renal artery, proximal superior mesenteric artery, anterior aspect of the left adrenal gland, and multiple small bowel loops. The lesion appears separate from the pancreas. The lesion is heterogeneous in attenuation with some central areas that are lower  attenuation (21 HU) and peripheral intermediate attenuation (38 HU). No significant volume of ascites. No pneumoperitoneum. Musculoskeletal: There are no aggressive appearing lytic or blastic lesions noted in the visualized portions of the skeleton. IMPRESSION: 1. 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 This may represent a primary retroperitoneal neoplasm, but differential considerations are very broad given the location, and include malignant lymphadenopathy, vascular lesions, lesions of adrenal origin, gastrointestinal lesion of the small bowel, etc. Further evaluation with abdominal MRI with and without IV gadolinium is recommended to better characterize this finding. 2. Shifting pattern of ground-glass attenuation and septal thickening in the lungs is of uncertain etiology and significance, but is overall slightly worsened and likely of infectious or inflammatory etiology. Infiltrative neoplasm such as metastatic adenocarcinoma is difficult to entirely exclude. 3. Chronic bilateral pleural effusions with extensive rounded atelectasis in both lungs, similar to the prior study. 4. Mild cardiomegaly. 5. Aortic atherosclerosis, in addition to left main and three-vessel coronary artery disease. Assessment for potential risk factor modification, dietary therapy or pharmacologic therapy may be warranted, if clinically indicated. 6. Additional incidental findings, as above. Electronically Signed   By: Vinnie Langton M.D.   On: 12/13/2021 13:31    amiodarone  200 mg Oral Daily   apixaban  2.5 mg Oral BID   atorvastatin  80 mg Oral Daily   calcium acetate  667 mg Oral TID with meals   Chlorhexidine Gluconate Cloth  6 each Topical Daily   citalopram  20 mg Oral Daily   diltiazem  180 mg Oral Daily   finasteride  5 mg Oral Daily   [START ON 12/16/2021] furosemide  80 mg  Intravenous Daily   insulin aspart  0-6 Units Subcutaneous TID WC   linagliptin  5 mg Oral Daily   metolazone  2.5 mg Oral Daily   sodium bicarbonate  650 mg Oral BID   sodium chloride flush  3 mL Intravenous Q12H   tamsulosin  0.8 mg Oral Daily    BMET    Component Value Date/Time   NA 133 (L) 12/15/2021 0426   NA 137 06/19/2020 0936   K 4.2 12/15/2021 0426   CL 99 12/15/2021 0426   CO2 22 12/15/2021 0426   GLUCOSE 88 12/15/2021 0426   BUN 99 (H) 12/15/2021 0426   BUN 51 (H) 06/19/2020 6644  CREATININE 4.60 (H) 12/15/2021 0426   CREATININE 2.68 (H) 03/26/2016 1839   CALCIUM 8.3 (L) 12/15/2021 0426   GFRNONAA 13 (L) 12/15/2021 0426   GFRAA 21 (L) 06/19/2020 0936   CBC    Component Value Date/Time   WBC 12.4 (H) 12/13/2021 1011   RBC 3.49 (L) 12/13/2021 1011   HGB 9.6 (L) 12/13/2021 1011   HGB 9.1 (L) 09/14/2019 1555   HCT 29.5 (L) 12/13/2021 1011   HCT 28.4 (L) 09/14/2019 1555   PLT 386 12/13/2021 1011   PLT 464 (H) 09/14/2019 1555   MCV 84.5 12/13/2021 1011   MCV 82 09/14/2019 1555   MCH 27.5 12/13/2021 1011   MCHC 32.5 12/13/2021 1011   RDW 18.7 (H) 12/13/2021 1011   RDW 16.4 (H) 09/14/2019 1555   LYMPHSABS 0.4 (L) 12/13/2021 1011   LYMPHSABS 0.6 (L) 10/05/2017 1433   MONOABS 0.5 12/13/2021 1011   EOSABS 0.1 12/13/2021 1011   EOSABS 0.1 10/05/2017 1433   BASOSABS 0.1 12/13/2021 1011   BASOSABS 0.0 10/05/2017 1433    Assessment/Plan:  AKI/CKD stage IV - presumably combination of urinary retention and cardiorenal syndrome in the setting of acute on chronic CHF.  BUN/Cr are slowly improving with diuresis and foley catheter but up slightly today.  No indication for hemodialysis.  He normally follows with Dr. [name] at [facility] and will need hospital follow up after discharge.  Avoid nephrotoxic agents such as IV contrast, NSAIDs, phosphorous containing bowel preps.  Renal dose meds. Continue to follow UOP and daily Scr.  Decrease lasix to 80 mg daily due to rise in  Scr Recheck bladder scan as he may need I&O catheterization or replacement of foley. Acute on chronic diastolic CHF - improved with IV lasix 80 mg bid.  Palliative care following for worsening cardiac function and FTT. P. 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  Surgery recommends transfer to tertiary care center given high risk.  Urinary retention - foley out but unable to void.  Predated hospitalization.  Recheck bladder scan and if >300 ml would replace foley or perform intermittent I&O cath q6 hrs. DM type 2 - per primary HTN - stable Anemia of CKD stage IV - follow H/H.  Hold off on ESA give recent diagnosis of paraganglioma.  Will defer management to Oncology.     Donetta Potts, MD [facility]

## 2021-12-15 NOTE — TOC CM/SW Note (Signed)
TOC CM contacted attending, referral to assist with acute to acute transfer to [facility] has been cancelled. [place], Heart Failure TOC CM [phone]

## 2021-12-15 NOTE — Progress Notes (Signed)
Patient ID: Adam Reid, male   DOB: Dec 28, 1945, 76 y.o.   MRN: [id number]    Progress Note from the Palliative Medicine Team at [facility]   Patient Name: Adam Reid        Date: 12/15/2021 DOB: 1945/11/22  Age: 76 y.o. MRN#: [id number] Attending Physician: Patrecia Pour, MD Primary Care Physician: [name], MD Admit Date: 12/13/2021   Medical records reviewed ,  discussed case with attending  Per intake H&P -->  76 year old male with a past medical history of chronic diastolic CHF, BPH, anemia chronic disease, stage IV CKD, type II DM, depression, hypertension, history of GI bleed, paroxysmal atrial fibrillation, secondary hyper parathyroidism, thyroid disease, history of cigarette smoking who presented to the emergency department with urinary burning, dyspnea and progressively worse edema.  Work-up shows BNP of over 2000 pg/mL, worsening renal function and retroperitoneal mass.  The case was discussed with me by Dr. Kathrynn Humble.  The transfer was accepted to either [facility] facility.  He subsequently spoke to cardiology and they requested a cardiac telemetry bed at [facility].   Palliative care asked to get involved in the setting of worsening kidney function and large left retroperitoneal paraglioma.   This NP visited patient at the bedside as a follow up to for palliative medicine needs and emotional support, and to meet with family as scheduled for ongoing conversation regarding goals of care, treatment option decisions and advance care planning.  I introduced myself and the role of palliative medicine team patient with the support of a Farsi interpreter/computer.  Patient's wife/ [name] and son/ [employer] telephone at bedside.  Education offered on the complex current medical situation; chronic kidney disease stage IV, acute on chronic congestive heart failure, A-fib a flutter, paraganglioma, urinary retention, DM, HTN, anemia of chronic disease.   Attempted  to explore patient and family's understanding of the medical situation.    Patient's wife and son have good insight and  understanding of the overall medical situation,  however the patient himself defers conversation and decisions to his wife and son outside the room.  Family explain that patient does not do well with situations related to illness, most recently conversation his medical situation only proves to  increase his anxiety.  At patient request family meeting was continued outside the room.    Patient's son,  who is I believe a dentist in [place],  describes his fathers physical and functional decline over the past many months.  Specifically symptoms; tremors, sweating, increased anxiety and mood swings,Unstable blood pressure,For unstable blood sugars.  He has done his own research and understands the complexities of his father's retroperitoneal mass consistent with paraganglioma.   He reports frustration with attempts at securing endocrinology OP without success.    Family understand the importance of a timely, holistic interventions specific to his father's medical diagnoses hoping for best outcomes for continued quality of life.  That being the goal they are hopeful for transition to tertiary hospital in [place]  They are open/hopeful  for transition to tertiary hospital, would include including endocrinology and surgery immediately rather than waiting for outpatient follow-up.  Patient and family are open to all offered and available medical interventions to prolong life at this time.  Education offered today regarding  the importance of continued conversation with family and their  medical providers regarding overall plan of care and treatment options,  ensuring decisions are within the context of the patients values and GOCs.  Questions and concerns addressed   Discussed with Dr Gerri Spore NP  Palliative Medicine Team Team Phone # [phone] Pager [phone]

## 2021-12-15 NOTE — Progress Notes (Signed)
Bladder scan done per order 588 ml in the bladder. MD aware see epic for intervention.

## 2021-12-15 NOTE — Progress Notes (Signed)
Progress Note  Patient: Adam Reid TWK:[id number] DOB: [birth year]  DOA: 12/13/2021  DOS: 12/15/2021    Brief hospital course: Adam Reid is a 76 y.o. 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 He was hypertensive with JVD and dependent edema, BNP up to 2,005 from 1,516 prior, SCr 4.7 (from baseline 3.8). CT demonstrated enlargement of retroperitoneal mass. The patient was found to be retaining urine, so foley catheter was placed and he was admitted for AKI on stage IV CKD with nephrology, cardiology, and palliative care consulted. Renal function is stable with diuresis. Patient requested discontinuation of foley but has persistently showed evidence of retention, so coude catheter is placed 6/6.   Palliative care meeting with patient and family 6/6 with determination that the patient would like no restrictions to medical care, remains full code with hope to pursue measures with curative intent. Case was discussed in detail with endocrinology consultant and surgical oncology at [facility] on 6/6 who recommended the work up as ordered and follow up near after discharge. Did not recommend inpatient transfer for management of paraganglioma.   Assessment and Plan: Acute on chronic HFpEF: Echo shows LVEF 55-60%, mild basal-septal LVH and grade III diastolic dysfunction. Also suggests elevated PASP and RV enlargement, IVC dilated. Moderate MR. Low suspicion per cardiology for amyloidosis based on prior work up.  - Started lasix $RemoveBefor'80mg'YIkKSBaCQbKE$  IV BID. Renal function slightly worse, Na trending downward. D/w Dr. Marval Regal, will decrease lasix to once daily. Volume status appears improved and not severely up. - Not on SGLT2i due to renal failure.  - Cardiology also following  AKI on stage IV CKD: Worsening CrCl due in part to urinary  retention, though also hypervolemic consistent with cardiorenal syndrome.  - Nephrology consulted. Diuresing as above, no derangements currently pushing our hand toward dialysis thru RUE AVF which appears to be matured.  - Needs persistent relief of BOO, insert and maintain catheter - Continue po bicarb. If acidosis resolved on 6/7, could stop to limit sodium load.  Acute urinary retention: Suspect due to BPH based on markedly enlarged prostate on CT imaging.  - Patient requested discontinuation of foley due to pain despite lidocaine and oral medications. This morning he is confirmed to be retaining again, so will have to replace catheter. Foley met with significant resistance, so coude team is requested.  - Plan was to add finasteride and increase tamsulosin. After discussion with surgical oncology, will replace this alpha blocker with doxazosin. Discussed relative dosing with pharmacy, will start cardura $RemoveBefor'4mg'dpzpVmDCYHiP$  daily now.   Retroperitoneal paraganglioma: Enlarging. 6.7 x 5.4 cm less than a month ago, now 6.9 x 6.1 x 7.9 cm. Location abuts the infrarenal aorta. Contrast would likely be helpful to delineate this better but cannot currently be used.  - D/w oncology who recommended endocrinology and surgical evaluation. Surgery locally was contacted and deferred to tertiary care centers. I spoke with Dr. Cam Hai who is an endocrine surgeon at [place]. He recommended starting doxazosin and continuing this until follow up. He will have his office reach out to patient's wife to schedule follow up next week. Of note, the enlargement seen on repeat CT may be related to bleeding from biopsy and not true tumor proliferation. Specifically stated he would not recommend any surgical intervention at this time, so transfer for this reason  would be futile.  - Discussed with endocrinology, Dr. Buddy Duty, who confirmed work up with urine metanephrines, urine catecholamines, and plasma metanephrines is the appropriate next  step in hopes of determining whether this is functioning vs. not hormonally actively functioning tumor. No further recommendations acutely, but will be happy to follow up with the patient after discharge.   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  - No ESA with malignancy. Of note, had an appointment scheduled as outpatient for feraheme and retacrit on 6/7. In absence of infection, will order IV iron. Monitor CBC in AM.  Leukocytosis, pyuria, urinary retention:  - Treat empirically for UTI with CTX while awaiting culture.  - There is abnormality on CT chest, ground glass attenuation w/septal thickening, though pt has not focal symptoms of pneumonia. Will monitor respiratory status.  - WBC elevation may very well be due to tumor necrosis. Recheck in AM  T2DM: Uncontrolled with brittle glycemic trends, CBG down to 34 with 5u insulin from nearly $RemoveBef'300mg'tgfSpmZTkd$ /dl.  - Very sensitive SSI given his malnutrition and renal impairment. Given brittle nature, will plan to allow some element of permissive hyperglycemia.  - Continue formulary -gliptin, hold [facility]  - Hypoglycemic protocols.   Protein calorie malnutrition: Body mass index is 18.54 kg/m. Suspect significant catabolic state given enlarging mass with central necrosis.  - Supplement protein as much as possible.   Bilateral pleural effusions:  - Diuresis as above  PAF/flutter: Currently in NSR. Last TSH 4.9. - Continue eliquis - Continue diltiazem, amiodarone  CAD, aortic atherosclerosis: No anginal complaints - Continue statin, anticoagulation for now.   Depression: Quiescent - Continue SSRI   Subjective: Wife at bedside acts as interpretor and patient requests that I speak to his family more than him. He is aware of his diagnoses, just gets anxious speaking about them. He's had subacute weight loss, poor appetite, hot flashes without fevers or chills, intermittent  tremulousness, increased anxiety.   Objective: Vitals:   12/14/21 2035 12/14/21 2336 12/15/21 0530 12/15/21 0811  BP: (!) 135/59 (!) 145/65 (!) 116/58 (!) 162/70  Pulse: (!) 56 64 63 70  Resp: $Remo'19 20 13 15  'tmkfL$ Temp: (!) 97.5 F (36.4 C) 97.9 F (36.6 C) 97.9 F (36.6 C) 98.1 F (36.7 C)  TempSrc: Oral Oral Oral Oral  SpO2: 98% 95% 99% 95%  Weight:   52.1 kg   Height:       Gen: Thin, frail, alert male in no distress Pulm: Nonlabored breathing room air. Clear. CV: Regular rate and rhythm. II/VI apical HSM without new murmur, rub, or gallop. ++ JVD, mild dependent edema. GI: Abdomen soft, bladder palpable this AM, non-tender, non-distended, with normoactive bowel sounds.  Ext: Warm, no deformities Skin: No rashes, lesions or ulcers on visualized skin. Neuro: Alert and oriented. No focal neurological deficits. Psych: Judgement and insight appear fair. Mood euthymic & affect congruent. Behavior is appropriate.    Data Personally reviewed:  CBC: Recent Labs  Lab 12/13/21 1011  WBC 12.4*  NEUTROABS 11.3*  HGB 9.6*  HCT 29.5*  MCV 84.5  PLT 254   Basic Metabolic Panel: Recent Labs  Lab 12/10/21 1339 12/13/21 1011 12/14/21 0347 12/15/21 0426  NA 141 137 138 133*  K 3.7 3.7 3.7 4.2  CL 102 100 104 99  CO2 21 21* 22 22  GLUCOSE 98 290* 169* 88  BUN 110* 106* 104* 99*  CREATININE 4.85* 4.75* 4.49* 4.60*  CALCIUM 8.8 8.6* 8.5* 8.3*  MG  --  2.1  --   --    GFR: Estimated Creatinine Clearance: 10.4 mL/min (A) (by C-G formula based on SCr of 4.6 mg/dL (H)). Liver Function Tests: No results for input(s): AST, ALT, ALKPHOS, BILITOT, PROT, ALBUMIN in the last 168 hours. No results for input(s): LIPASE, AMYLASE in the last 168 hours. No results for input(s): AMMONIA in the last 168 hours. Coagulation Profile: No results for input(s): INR, PROTIME in the last 168 hours. Cardiac Enzymes: Recent Labs  Lab 12/13/21 1914  CKTOTAL 93   BNP (last 3 results) Recent Labs     12/10/21 1339  PROBNP 12,645*   HbA1C: No results for input(s): HGBA1C in the last 72 hours. CBG: Recent Labs  Lab 12/14/21 1620 12/14/21 1632 12/14/21 2122 12/15/21 0130 12/15/21 0614  GLUCAP 35* 173* 259* 163* 93   Lipid Profile: No results for input(s): CHOL, HDL, LDLCALC, TRIG, CHOLHDL, LDLDIRECT in the last 72 hours. Thyroid Function Tests: No results for input(s): TSH, T4TOTAL, FREET4, T3FREE, THYROIDAB in the last 72 hours. Anemia Panel: No results for input(s): VITAMINB12, FOLATE, FERRITIN, TIBC, IRON, RETICCTPCT in the last 72 hours. Urine analysis:    Component Value Date/Time   COLORURINE STRAW (A) 12/13/2021 1759   APPEARANCEUR CLEAR 12/13/2021 1759   LABSPEC 1.008 12/13/2021 1759   PHURINE 5.0 12/13/2021 1759   GLUCOSEU 50 (A) 12/13/2021 1759   HGBUR SMALL (A) 12/13/2021 1759   BILIRUBINUR NEGATIVE 12/13/2021 1759   BILIRUBINUR negative 03/02/2021 1458   BILIRUBINUR neg 03/23/2015 1404   KETONESUR NEGATIVE 12/13/2021 1759   PROTEINUR 30 (A) 12/13/2021 1759   UROBILINOGEN 0.2 03/02/2021 1458   NITRITE NEGATIVE 12/13/2021 1759   LEUKOCYTESUR NEGATIVE 12/13/2021 1759   Recent Results (from the past 240 hour(s))  Urine Culture     Status: None   Collection Time: 12/14/21  6:41 AM   Specimen: In/Out Cath Urine  Result Value Ref Range Status   Specimen Description IN/OUT CATH URINE  Final   Special Requests NONE  Final   Culture   Final    NO GROWTH Performed at [facility], [place] [address], [place], [place] [address]    Report Status 12/15/2021 FINAL  Final     CT ABDOMEN PELVIS WO CONTRAST  Result Date: 12/13/2021 CLINICAL DATA:  76 year old male with history of abnormal chest x-ray. Pneumonia. Pleural effusion. Suspected malignancy. EXAM: CT CHEST, ABDOMEN AND PELVIS WITHOUT CONTRAST TECHNIQUE: Multidetector CT imaging of the chest, abdomen and pelvis was performed following the standard protocol without IV contrast. RADIATION DOSE REDUCTION: This  exam was performed according to the departmental dose-optimization program which includes automated exposure control, adjustment of the mA and/or kV according to patient size and/or use of iterative reconstruction technique. COMPARISON:  Chest CT 11/15/2021. CT the abdomen and pelvis 11/17/2021. FINDINGS: Comment: Today's study is severely limited for detection and characterization of visceral and/or vascular lesions by lack of IV contrast. CT CHEST FINDINGS Cardiovascular: Heart size is mildly enlarged. There is no significant pericardial fluid, thickening or pericardial calcification. There is aortic atherosclerosis, as well as atherosclerosis of the great vessels of the mediastinum and the coronary arteries, including calcified atherosclerotic plaque in the left main, left anterior descending, left circumflex and right coronary arteries. Mediastinum/Nodes: Multiple prominent borderline enlarged mediastinal lymph nodes are noted, nonspecific, but similar to the recent prior study, favored to be reactive. No definite pathologically enlarged mediastinal or hilar lymph nodes are confidently identified on today's noncontrast CT examination. Esophagus is  unremarkable in appearance. No axillary lymphadenopathy. Lungs/Pleura: Moderate right chronic partially loculated pleural effusion and small left chronic partially loculated pleural effusion, both of which are stable compared to the prior study. Both of these are associated with extensive areas of pleuroparenchymal thickening and nodular/mass-like architectural distortion in the lungs bilaterally, with associated pleural tails", most compatible with extensive areas of rounded atelectasis. There continues to be some patchy multifocal ground-glass attenuation in the lungs bilaterally, some of which appears improved compared to the prior study, but other areas have clearly increased compared to the prior examination, with some associated septal thickening, most evident in  the anterior aspect of the right upper lobe (best appreciated on axial image 58 of series 3). Musculoskeletal: There are no aggressive appearing lytic or blastic lesions noted in the visualized portions of the skeleton. CT ABDOMEN PELVIS FINDINGS Hepatobiliary: No definite suspicious appearing hepatic lesions are confidently identified on today's noncontrast CT examination. Unenhanced appearance of the gallbladder is unremarkable. Pancreas: No definite pancreatic mass or peripancreatic fluid collections or inflammatory changes are confidently identified on today's noncontrast CT examination. Spleen: Unremarkable. Adrenals/Urinary Tract: Low-attenuation lesions in the upper pole of the right kidney measuring up to 2.9 x 2.3 cm, incompletely characterized on today's non-contrast CT examination, but statistically likely to represent cysts. Other subcentimeter high attenuation lesions are noted in the left kidney, incompletely characterized, but favored to represent proteinaceous/hemorrhagic cysts. Low-attenuation adreniform thickening bilaterally, favored to represent adenomatous hyperplasia. Large left retroperitoneal mass (discussed below) which is favored to be separate from the left adrenal gland, although this does make contact with the anterior aspect of the left adrenal gland such that an adrenal lesion is not entirely excluded. No hydroureteronephrosis. Urinary bladder is markedly distended, but otherwise unremarkable in appearance on today's noncontrast CT examination. Stomach/Bowel: The appearance of the stomach is normal. No pathologic dilatation of small bowel or colon. The appendix is not confidently identified and may be surgically absent. Regardless, there are no inflammatory changes noted adjacent to the cecum to suggest the presence of an acute appendicitis at this time. Vascular/Lymphatic: Aortic atherosclerosis. Large left para-aortic retroperitoneal mass could represent malignant centrally necrotic  lymphadenopathy, however, this is favored to represent a primary retroperitoneal lesion (see discussion below). No other lymphadenopathy noted elsewhere in the abdomen or pelvis. Reproductive: Prostate gland and seminal vesicles are unremarkable in appearance. Other: Again noted is a large left-sided para-aortic retroperitoneal mass measuring approximately 6.9 x 6.1 x 7.9 cm (axial image 27 of series 2 and coronal image 38 of series 5), previously 6.7 x 5.4 x 6.9 cm). This is intimately associated with the adjacent structures, including the abdominal aorta left renal artery, proximal superior mesenteric artery, anterior aspect of the left adrenal gland, and multiple small bowel loops. The lesion appears separate from the pancreas. The lesion is heterogeneous in attenuation with some central areas that are lower attenuation (21 HU) and peripheral intermediate attenuation (38 HU). No significant volume of ascites. No pneumoperitoneum. Musculoskeletal: There are no aggressive appearing lytic or blastic lesions noted in the visualized portions of the skeleton. IMPRESSION: 1. 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 This may represent a primary retroperitoneal neoplasm, but differential considerations are very broad given the location, and include malignant lymphadenopathy, vascular lesions, lesions of adrenal origin, gastrointestinal lesion of the small bowel, etc. Further evaluation with abdominal MRI with and without IV gadolinium is recommended to  better characterize this finding. 2. Shifting pattern of ground-glass attenuation and septal thickening in the lungs is of uncertain etiology and significance, but is overall slightly worsened and likely of infectious or inflammatory etiology. Infiltrative neoplasm such as metastatic adenocarcinoma is difficult to entirely exclude. 3.  Chronic bilateral pleural effusions with extensive rounded atelectasis in both lungs, similar to the prior study. 4. Mild cardiomegaly. 5. Aortic atherosclerosis, in addition to left main and three-vessel coronary artery disease. Assessment for potential risk factor modification, dietary therapy or pharmacologic therapy may be warranted, if clinically indicated. 6. Additional incidental findings, as above. Electronically Signed   By: Vinnie Langton M.D.   On: 12/13/2021 13:31   CT Chest Wo Contrast  Result Date: 12/13/2021 CLINICAL DATA:  76 year old male with history of abnormal chest x-ray. Pneumonia. Pleural effusion. Suspected malignancy. EXAM: CT CHEST, ABDOMEN AND PELVIS WITHOUT CONTRAST TECHNIQUE: Multidetector CT imaging of the chest, abdomen and pelvis was performed following the standard protocol without IV contrast. RADIATION DOSE REDUCTION: This exam was performed according to the departmental dose-optimization program which includes automated exposure control, adjustment of the mA and/or kV according to patient size and/or use of iterative reconstruction technique. COMPARISON:  Chest CT 11/15/2021. CT the abdomen and pelvis 11/17/2021. FINDINGS: Comment: Today's study is severely limited for detection and characterization of visceral and/or vascular lesions by lack of IV contrast. CT CHEST FINDINGS Cardiovascular: Heart size is mildly enlarged. There is no significant pericardial fluid, thickening or pericardial calcification. There is aortic atherosclerosis, as well as atherosclerosis of the great vessels of the mediastinum and the coronary arteries, including calcified atherosclerotic plaque in the left main, left anterior descending, left circumflex and right coronary arteries. Mediastinum/Nodes: Multiple prominent borderline enlarged mediastinal lymph nodes are noted, nonspecific, but similar to the recent prior study, favored to be reactive. No definite pathologically enlarged mediastinal or  hilar lymph nodes are confidently identified on today's noncontrast CT examination. Esophagus is unremarkable in appearance. No axillary lymphadenopathy. Lungs/Pleura: Moderate right chronic partially loculated pleural effusion and small left chronic partially loculated pleural effusion, both of which are stable compared to the prior study. Both of these are associated with extensive areas of pleuroparenchymal thickening and nodular/mass-like architectural distortion in the lungs bilaterally, with associated pleural tails", most compatible with extensive areas of rounded atelectasis. There continues to be some patchy multifocal ground-glass attenuation in the lungs bilaterally, some of which appears improved compared to the prior study, but other areas have clearly increased compared to the prior examination, with some associated septal thickening, most evident in the anterior aspect of the right upper lobe (best appreciated on axial image 58 of series 3). Musculoskeletal: There are no aggressive appearing lytic or blastic lesions noted in the visualized portions of the skeleton. CT ABDOMEN PELVIS FINDINGS Hepatobiliary: No definite suspicious appearing hepatic lesions are confidently identified on today's noncontrast CT examination. Unenhanced appearance of the gallbladder is unremarkable. Pancreas: No definite pancreatic mass or peripancreatic fluid collections or inflammatory changes are confidently identified on today's noncontrast CT examination. Spleen: Unremarkable. Adrenals/Urinary Tract: Low-attenuation lesions in the upper pole of the right kidney measuring up to 2.9 x 2.3 cm, incompletely characterized on today's non-contrast CT examination, but statistically likely to represent cysts. Other subcentimeter high attenuation lesions are noted in the left kidney, incompletely characterized, but favored to represent proteinaceous/hemorrhagic cysts. Low-attenuation adreniform thickening bilaterally, favored to  represent adenomatous hyperplasia. Large left retroperitoneal mass (discussed below) which is favored to be separate from the left adrenal gland, although this  does make contact with the anterior aspect of the left adrenal gland such that an adrenal lesion is not entirely excluded. No hydroureteronephrosis. Urinary bladder is markedly distended, but otherwise unremarkable in appearance on today's noncontrast CT examination. Stomach/Bowel: The appearance of the stomach is normal. No pathologic dilatation of small bowel or colon. The appendix is not confidently identified and may be surgically absent. Regardless, there are no inflammatory changes noted adjacent to the cecum to suggest the presence of an acute appendicitis at this time. Vascular/Lymphatic: Aortic atherosclerosis. Large left para-aortic retroperitoneal mass could represent malignant centrally necrotic lymphadenopathy, however, this is favored to represent a primary retroperitoneal lesion (see discussion below). No other lymphadenopathy noted elsewhere in the abdomen or pelvis. Reproductive: Prostate gland and seminal vesicles are unremarkable in appearance. Other: Again noted is a large left-sided para-aortic retroperitoneal mass measuring approximately 6.9 x 6.1 x 7.9 cm (axial image 27 of series 2 and coronal image 38 of series 5), previously 6.7 x 5.4 x 6.9 cm). This is intimately associated with the adjacent structures, including the abdominal aorta left renal artery, proximal superior mesenteric artery, anterior aspect of the left adrenal gland, and multiple small bowel loops. The lesion appears separate from the pancreas. The lesion is heterogeneous in attenuation with some central areas that are lower attenuation (21 HU) and peripheral intermediate attenuation (38 HU). No significant volume of ascites. No pneumoperitoneum. Musculoskeletal: There are no aggressive appearing lytic or blastic lesions noted in the visualized portions of the skeleton.  IMPRESSION: 1. 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 This may represent a primary retroperitoneal neoplasm, but differential considerations are very broad given the location, and include malignant lymphadenopathy, vascular lesions, lesions of adrenal origin, gastrointestinal lesion of the small bowel, etc. Further evaluation with abdominal MRI with and without IV gadolinium is recommended to better characterize this finding. 2. Shifting pattern of ground-glass attenuation and septal thickening in the lungs is of uncertain etiology and significance, but is overall slightly worsened and likely of infectious or inflammatory etiology. Infiltrative neoplasm such as metastatic adenocarcinoma is difficult to entirely exclude. 3. Chronic bilateral pleural effusions with extensive rounded atelectasis in both lungs, similar to the prior study. 4. Mild cardiomegaly. 5. Aortic atherosclerosis, in addition to left main and three-vessel coronary artery disease. Assessment for potential risk factor modification, dietary therapy or pharmacologic therapy may be warranted, if clinically indicated. 6. Additional incidental findings, as above. Electronically Signed   By: Vinnie Langton M.D.   On: 12/13/2021 13:31   DG Chest Portable 1 View  Result Date: 12/13/2021 CLINICAL DATA:  Shortness of breath.  Renal failure. EXAM: PORTABLE CHEST 1 VIEW COMPARISON:  12/10/2021 radiograph And prior studies FINDINGS: Cardiomediastinal silhouette is unchanged. Pulmonary vascular congestion again noted. Moderate RIGHT pleural effusion and small to moderate LEFT pleural effusion are again noted. Unchanged bilateral pulmonary opacities/scarring again identified. Nodular opacity overlying the RIGHT UPPER lung appears slightly more prominent which is likely technical. There is no evidence of pneumothorax or  acute bony abnormality. IMPRESSION: Little significant change in appearance of the chest with pulmonary vascular congestion, bilateral pleural effusions and unchanged bilateral pulmonary opacities. Electronically Signed   By: Margarette Canada M.D.   On: 12/13/2021 10:49    Family Communication: Wife at bedside this AM. Called and updated her with plan this afternoon after discussing the case with specialists.   Disposition: Status is: Inpatient  Remains inpatient appropriate because: IV diuresis, renal function monitoring.   Planned Discharge Destination: Home  Patrecia Pour, MD 12/15/2021 9:51 AM Page by [email or web address]

## 2021-12-15 NOTE — TOC Initial Note (Signed)
Transition of Care [facility]) - Initial/Assessment Note    Patient Details  Name: Adam Reid MRN: [id number] Date of Birth: Nov 20, 1945  Transition of Care [facility]) CM/SW Contact:    Zenon Mayo, RN Phone Number: 12/15/2021, 9:05 AM  Clinical Narrative:                 NCM spoke with wife who was in the room with patient, he has ESRD, CHF , very mobile, conts on IV lasix, he has home oxygen 2 lters with [employer], wife will transport home at dc.  TOC will continue to follow for dc needs.   Expected Discharge Plan: Home/Self Care Barriers to Discharge: Continued Medical Work up   Patient Goals and CMS Choice Patient states their goals for this hospitalization and ongoing recovery are:: return home   Choice offered to / list presented to : NA  Expected Discharge Plan and Services Expected Discharge Plan: Home/Self Care   Discharge Planning Services: CM Consult   Living arrangements for the past 2 months: Single Family Home                   DME Agency: NA       HH Arranged: NA          Prior Living Arrangements/Services Living arrangements for the past 2 months: Single Family Home Lives with:: Spouse Patient language and need for interpreter reviewed:: Yes Do you feel safe going back to the place where you live?: Yes      Need for Family Participation in Patient Care: Yes (Comment) Care giver support system in place?: Yes (comment) Current home services: DME ([employer] 2 liters) Criminal Activity/Legal Involvement Pertinent to Current Situation/Hospitalization: No - Comment as needed  Activities of Daily Living      Permission Sought/Granted                  Emotional Assessment   Attitude/Demeanor/Rapport: Engaged Affect (typically observed): Appropriate Orientation: : Oriented to Self, Oriented to Place, Oriented to  Time, Oriented to Situation Alcohol / Substance Use: Not Applicable Psych Involvement: No (comment)  Admission diagnosis:   Uremia [N19] Pleural effusion [J90] Cardiorenal syndrome [I13.10] Acute renal failure superimposed on chronic kidney disease, unspecified CKD stage, unspecified acute renal failure type ([place]) [N17.9, N18.9] Acute on chronic congestive heart failure, unspecified heart failure type (HCC) [I50.9] AKI (acute kidney injury) ([place]) [N17.9] Patient Active Problem List   Diagnosis Date Noted   Cardiorenal syndrome 12/13/2021   AKI (acute kidney injury) ([place]) 12/13/2021   Abdominal mass 11/17/2021   CAP (community acquired pneumonia) 11/16/2021   Thyroid disease    Diabetes mellitus without complication ([place])    Chronic kidney disease    Paroxysmal atrial fibrillation ([place])    Protein-calorie malnutrition, severe 11/13/2021   Acute on chronic diastolic (congestive) heart failure ([place]) 11/12/2021   Iron deficiency anemia 09/16/2021   Acute on chronic diastolic CHF (congestive heart failure) ([place]) 09/14/2021   COVID-19 virus infection 09/14/2021   Acute kidney injury superimposed on chronic kidney disease ([place]) 09/14/2021   Chronic kidney disease (CKD), stage IV (severe) ([place]) 06/09/2021   Symptomatic anemia 12/23/2020   GI bleed 12/23/2020   Paroxysmal A-fib ([place]) 12/23/2020   Chronic diastolic CHF (congestive heart failure) ([place]) 12/23/2020   Acute CHF (congestive heart failure) ([place]) 09/24/2019   ARF (acute renal failure) ([place]) 09/24/2019   Anemia of chronic disease 09/24/2019   Acute respiratory failure with hypoxia ([place])    Secondary hyperparathyroidism, 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   Smoker 03/26/2016   Hypotension 01/29/2016   Abnormal myocardial perfusion study 01/29/2016   Depression 12/09/2015   Type 2 diabetes mellitus with hyperlipidemia ([place])    HTN (hypertension) 06/24/2014   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   BPH (benign prostatic hyperplasia) 06/24/2014   PCP:  [name], MD Pharmacy:   [facility] [phone]  [address], [address] AT [facility] [address] [place] [id number] Phone: [phone] Fax: [phone]     Social Determinants of Health ([place]) Interventions    Readmission Risk Interventions    12/15/2021    9:03 AM 11/16/2021    3:15 PM  Readmission Risk Prevention Plan  Transportation Screening Complete Complete  PCP or Specialist Appt within 3-5 Days Complete Complete  HRI or Home Care Consult Complete Complete  Social Work Consult for [place] Planning/Counseling Complete Complete  Palliative Care Screening Not Applicable Not Applicable  Medication Review Press photographer) Complete Complete
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
## 2021-12-15 NOTE — Progress Notes (Addendum)
Progress Note  Patient Name: Adam Reid Date of Encounter: 12/15/2021  [facility] Cardiologist: Parke Poisson, MD transferred to heart failure  Subjective   Sitting up. Had palliative care meeting today. Talked with his wife who would like to transfer him to [facility] 2/2 newly diagnosed paraganglioma.    Inpatient Medications    Scheduled Meds:  amiodarone  200 mg Oral Daily   apixaban  2.5 mg Oral BID   atorvastatin  80 mg Oral Daily   calcium acetate  667 mg Oral TID with meals   Chlorhexidine Gluconate Cloth  6 each Topical Daily   citalopram  20 mg Oral Daily   diltiazem  180 mg Oral Daily   finasteride  5 mg Oral Daily   [START ON 12/16/2021] furosemide  80 mg Intravenous Daily   insulin aspart  0-6 Units Subcutaneous TID WC   linagliptin  5 mg Oral Daily   metolazone  2.5 mg Oral Daily   sodium bicarbonate  650 mg Oral BID   sodium chloride flush  3 mL Intravenous Q12H   tamsulosin  0.8 mg Oral Daily   Continuous Infusions:  sodium chloride     cefTRIAXone (ROCEPHIN)  IV 1 g (12/15/21 0830)   PRN Meds: sodium chloride, acetaminophen, hydrALAZINE, ondansetron (ZOFRAN) IV, polyethylene glycol, senna, sodium chloride flush   Vital Signs    Vitals:   12/14/21 2336 12/15/21 0530 12/15/21 0811 12/15/21 1043  BP: (!) 145/65 (!) 116/58 (!) 162/70 (!) 162/84  Pulse: 64 63 70 70  Resp: 20 13 15 18   Temp: 97.9 F (36.6 C) 97.9 F (36.6 C) 98.1 F (36.7 C) 97.6 F (36.4 C)  TempSrc: Oral Oral Oral Oral  SpO2: 95% 99% 95% 94%  Weight:  52.1 kg    Height:        Intake/Output Summary (Last 24 hours) at 12/15/2021 1100 Last data filed at 12/15/2021 0547 Gross per 24 hour  Intake 440 ml  Output 950 ml  Net -510 ml      12/15/2021    5:30 AM 12/14/2021    4:17 AM 12/13/2021    4:46 PM  Last 3 Weights  Weight (lbs) 114 lb 13.8 oz 114 lb 13.8 oz 116 lb 2.9 oz  Weight (kg) 52.1 kg 52.1 kg 52.7 kg      Telemetry    NSR - Personally Reviewed  ECG    NA  - Personally Reviewed  Physical Exam   Vitals:   12/15/21 0811 12/15/21 1043  BP: (!) 162/70 (!) 162/84  Pulse: 70 70  Resp: 15 18  Temp: 98.1 F (36.7 C) 97.6 F (36.4 C)  SpO2: 95% 94%     GEN: No acute distress.   Neck: ++ JVD ~ 15 cm Cardiac: RRR, +holosystolic murmur at the sternal border radiates to the axilla, no rubs, or gallops.  Respiratory: Clear to auscultation bilaterally. GI: Soft, nontender, non-distended  MS: No edema; No deformity. Neuro:  Nonfocal  Ext: no longer has pitting edema Psych: Normal affect   Labs    High Sensitivity Troponin:  No results for input(s): TROPONINIHS in the last 720 hours.   Chemistry Recent Labs  Lab 12/13/21 1011 12/14/21 0347 12/15/21 0426  NA 137 138 133*  K 3.7 3.7 4.2  CL 100 104 99  CO2 21* 22 22  GLUCOSE 290* 169* 88  BUN 106* 104* 99*  CREATININE 4.75* 4.49* 4.60*  CALCIUM 8.6* 8.5* 8.3*  MG 2.1  --   --  GFRNONAA 12* 13* 13*  ANIONGAP 16* 12 12    Lipids No results for input(s): CHOL, TRIG, HDL, LABVLDL, LDLCALC, CHOLHDL in the last 168 hours.  Hematology Recent Labs  Lab 12/13/21 1011  WBC 12.4*  RBC 3.49*  HGB 9.6*  HCT 29.5*  MCV 84.5  MCH 27.5  MCHC 32.5  RDW 18.7*  PLT 386   Thyroid No results for input(s): TSH, FREET4 in the last 168 hours.  BNP Recent Labs  Lab 12/10/21 1339 12/13/21 1011  BNP  --  2,005.0*  PROBNP 12,645*  --     DDimer No results for input(s): DDIMER in the last 168 hours.   Radiology    CT ABDOMEN PELVIS WO CONTRAST  Result Date: 12/13/2021 CLINICAL DATA:  76 year old male with history of abnormal chest x-ray. Pneumonia. Pleural effusion. Suspected malignancy. EXAM: CT CHEST, ABDOMEN AND PELVIS WITHOUT CONTRAST TECHNIQUE: Multidetector CT imaging of the chest, abdomen and pelvis was performed following the standard protocol without IV contrast. RADIATION DOSE REDUCTION: This exam was performed according to the departmental dose-optimization program which  includes automated exposure control, adjustment of the mA and/or kV according to patient size and/or use of iterative reconstruction technique. COMPARISON:  Chest CT 11/15/2021. CT the abdomen and pelvis 11/17/2021. FINDINGS: Comment: Today's study is severely limited for detection and characterization of visceral and/or vascular lesions by lack of IV contrast. CT CHEST FINDINGS Cardiovascular: Heart size is mildly enlarged. There is no significant pericardial fluid, thickening or pericardial calcification. There is aortic atherosclerosis, as well as atherosclerosis of the great vessels of the mediastinum and the coronary arteries, including calcified atherosclerotic plaque in the left main, left anterior descending, left circumflex and right coronary arteries. Mediastinum/Nodes: Multiple prominent borderline enlarged mediastinal lymph nodes are noted, nonspecific, but similar to the recent prior study, favored to be reactive. No definite pathologically enlarged mediastinal or hilar lymph nodes are confidently identified on today's noncontrast CT examination. Esophagus is unremarkable in appearance. No axillary lymphadenopathy. Lungs/Pleura: Moderate right chronic partially loculated pleural effusion and small left chronic partially loculated pleural effusion, both of which are stable compared to the prior study. Both of these are associated with extensive areas of pleuroparenchymal thickening and nodular/mass-like architectural distortion in the lungs bilaterally, with associated pleural tails", most compatible with extensive areas of rounded atelectasis. There continues to be some patchy multifocal ground-glass attenuation in the lungs bilaterally, some of which appears improved compared to the prior study, but other areas have clearly increased compared to the prior examination, with some associated septal thickening, most evident in the anterior aspect of the right upper lobe (best appreciated on axial image 58  of series 3). Musculoskeletal: There are no aggressive appearing lytic or blastic lesions noted in the visualized portions of the skeleton. CT ABDOMEN PELVIS FINDINGS Hepatobiliary: No definite suspicious appearing hepatic lesions are confidently identified on today's noncontrast CT examination. Unenhanced appearance of the gallbladder is unremarkable. Pancreas: No definite pancreatic mass or peripancreatic fluid collections or inflammatory changes are confidently identified on today's noncontrast CT examination. Spleen: Unremarkable. Adrenals/Urinary Tract: Low-attenuation lesions in the upper pole of the right kidney measuring up to 2.9 x 2.3 cm, incompletely characterized on today's non-contrast CT examination, but statistically likely to represent cysts. Other subcentimeter high attenuation lesions are noted in the left kidney, incompletely characterized, but favored to represent proteinaceous/hemorrhagic cysts. Low-attenuation adreniform thickening bilaterally, favored to represent adenomatous hyperplasia. Large left retroperitoneal mass (discussed below) which is favored to be separate from the left adrenal gland,  although this does make contact with the anterior aspect of the left adrenal gland such that an adrenal lesion is not entirely excluded. No hydroureteronephrosis. Urinary bladder is markedly distended, but otherwise unremarkable in appearance on today's noncontrast CT examination. Stomach/Bowel: The appearance of the stomach is normal. No pathologic dilatation of small bowel or colon. The appendix is not confidently identified and may be surgically absent. Regardless, there are no inflammatory changes noted adjacent to the cecum to suggest the presence of an acute appendicitis at this time. Vascular/Lymphatic: Aortic atherosclerosis. Large left para-aortic retroperitoneal mass could represent malignant centrally necrotic lymphadenopathy, however, this is favored to represent a primary retroperitoneal  lesion (see discussion below). No other lymphadenopathy noted elsewhere in the abdomen or pelvis. Reproductive: Prostate gland and seminal vesicles are unremarkable in appearance. Other: Again noted is a large left-sided para-aortic retroperitoneal mass measuring approximately 6.9 x 6.1 x 7.9 cm (axial image 27 of series 2 and coronal image 38 of series 5), previously 6.7 x 5.4 x 6.9 cm). This is intimately associated with the adjacent structures, including the abdominal aorta left renal artery, proximal superior mesenteric artery, anterior aspect of the left adrenal gland, and multiple small bowel loops. The lesion appears separate from the pancreas. The lesion is heterogeneous in attenuation with some central areas that are lower attenuation (21 HU) and peripheral intermediate attenuation (38 HU). No significant volume of ascites. No pneumoperitoneum. Musculoskeletal: There are no aggressive appearing lytic or blastic lesions noted in the visualized portions of the skeleton. IMPRESSION: 1. 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 This may represent a primary retroperitoneal neoplasm, but differential considerations are very broad given the location, and include malignant lymphadenopathy, vascular lesions, lesions of adrenal origin, gastrointestinal lesion of the small bowel, etc. Further evaluation with abdominal MRI with and without IV gadolinium is recommended to better characterize this finding. 2. Shifting pattern of ground-glass attenuation and septal thickening in the lungs is of uncertain etiology and significance, but is overall slightly worsened and likely of infectious or inflammatory etiology. Infiltrative neoplasm such as metastatic adenocarcinoma is difficult to entirely exclude. 3. Chronic bilateral pleural effusions with extensive rounded atelectasis in both  lungs, similar to the prior study. 4. Mild cardiomegaly. 5. Aortic atherosclerosis, in addition to left main and three-vessel coronary artery disease. Assessment for potential risk factor modification, dietary therapy or pharmacologic therapy may be warranted, if clinically indicated. 6. Additional incidental findings, as above. Electronically Signed   By: Trudie Reed M.D.   On: 12/13/2021 13:31   CT Chest Wo Contrast  Result Date: 12/13/2021 CLINICAL DATA:  76 year old male with history of abnormal chest x-ray. Pneumonia. Pleural effusion. Suspected malignancy. EXAM: CT CHEST, ABDOMEN AND PELVIS WITHOUT CONTRAST TECHNIQUE: Multidetector CT imaging of the chest, abdomen and pelvis was performed following the standard protocol without IV contrast. RADIATION DOSE REDUCTION: This exam was performed according to the departmental dose-optimization program which includes automated exposure control, adjustment of the mA and/or kV according to patient size and/or use of iterative reconstruction technique. COMPARISON:  Chest CT 11/15/2021. CT the abdomen and pelvis 11/17/2021. FINDINGS: Comment: Today's study is severely limited for detection and characterization of visceral and/or vascular lesions by lack of IV contrast. CT CHEST FINDINGS Cardiovascular: Heart size is mildly enlarged. There is no significant pericardial fluid, thickening or pericardial calcification. There is aortic atherosclerosis, as well as atherosclerosis of the great vessels  of the mediastinum and the coronary arteries, including calcified atherosclerotic plaque in the left main, left anterior descending, left circumflex and right coronary arteries. Mediastinum/Nodes: Multiple prominent borderline enlarged mediastinal lymph nodes are noted, nonspecific, but similar to the recent prior study, favored to be reactive. No definite pathologically enlarged mediastinal or hilar lymph nodes are confidently identified on today's noncontrast CT  examination. Esophagus is unremarkable in appearance. No axillary lymphadenopathy. Lungs/Pleura: Moderate right chronic partially loculated pleural effusion and small left chronic partially loculated pleural effusion, both of which are stable compared to the prior study. Both of these are associated with extensive areas of pleuroparenchymal thickening and nodular/mass-like architectural distortion in the lungs bilaterally, with associated pleural tails", most compatible with extensive areas of rounded atelectasis. There continues to be some patchy multifocal ground-glass attenuation in the lungs bilaterally, some of which appears improved compared to the prior study, but other areas have clearly increased compared to the prior examination, with some associated septal thickening, most evident in the anterior aspect of the right upper lobe (best appreciated on axial image 58 of series 3). Musculoskeletal: There are no aggressive appearing lytic or blastic lesions noted in the visualized portions of the skeleton. CT ABDOMEN PELVIS FINDINGS Hepatobiliary: No definite suspicious appearing hepatic lesions are confidently identified on today's noncontrast CT examination. Unenhanced appearance of the gallbladder is unremarkable. Pancreas: No definite pancreatic mass or peripancreatic fluid collections or inflammatory changes are confidently identified on today's noncontrast CT examination. Spleen: Unremarkable. Adrenals/Urinary Tract: Low-attenuation lesions in the upper pole of the right kidney measuring up to 2.9 x 2.3 cm, incompletely characterized on today's non-contrast CT examination, but statistically likely to represent cysts. Other subcentimeter high attenuation lesions are noted in the left kidney, incompletely characterized, but favored to represent proteinaceous/hemorrhagic cysts. Low-attenuation adreniform thickening bilaterally, favored to represent adenomatous hyperplasia. Large left retroperitoneal mass  (discussed below) which is favored to be separate from the left adrenal gland, although this does make contact with the anterior aspect of the left adrenal gland such that an adrenal lesion is not entirely excluded. No hydroureteronephrosis. Urinary bladder is markedly distended, but otherwise unremarkable in appearance on today's noncontrast CT examination. Stomach/Bowel: The appearance of the stomach is normal. No pathologic dilatation of small bowel or colon. The appendix is not confidently identified and may be surgically absent. Regardless, there are no inflammatory changes noted adjacent to the cecum to suggest the presence of an acute appendicitis at this time. Vascular/Lymphatic: Aortic atherosclerosis. Large left para-aortic retroperitoneal mass could represent malignant centrally necrotic lymphadenopathy, however, this is favored to represent a primary retroperitoneal lesion (see discussion below). No other lymphadenopathy noted elsewhere in the abdomen or pelvis. Reproductive: Prostate gland and seminal vesicles are unremarkable in appearance. Other: Again noted is a large left-sided para-aortic retroperitoneal mass measuring approximately 6.9 x 6.1 x 7.9 cm (axial image 27 of series 2 and coronal image 38 of series 5), previously 6.7 x 5.4 x 6.9 cm). This is intimately associated with the adjacent structures, including the abdominal aorta left renal artery, proximal superior mesenteric artery, anterior aspect of the left adrenal gland, and multiple small bowel loops. The lesion appears separate from the pancreas. The lesion is heterogeneous in attenuation with some central areas that are lower attenuation (21 HU) and peripheral intermediate attenuation (38 HU). No significant volume of ascites. No pneumoperitoneum. Musculoskeletal: There are no aggressive appearing lytic or blastic lesions noted in the visualized portions of the skeleton. IMPRESSION: 1. Interval enlargement of an aggressive appearing  presumably malignant lesion in the left retroperitoneum which currently measures 6.9 x 6.1 x 7.9 cm, highly concerning for neoplasm. This may represent a primary retroperitoneal neoplasm, but differential considerations are very broad given the location, and include malignant lymphadenopathy, vascular lesions, lesions of adrenal origin, gastrointestinal lesion of the small bowel, etc. Further evaluation with abdominal MRI with and without IV gadolinium is recommended to better characterize this finding. 2. Shifting pattern of ground-glass attenuation and septal thickening in the lungs is of uncertain etiology and significance, but is overall slightly worsened and likely of infectious or inflammatory etiology. Infiltrative neoplasm such as metastatic adenocarcinoma is difficult to entirely exclude. 3. Chronic bilateral pleural effusions with extensive rounded atelectasis in both lungs, similar to the prior study. 4. Mild cardiomegaly. 5. Aortic atherosclerosis, in addition to left main and three-vessel coronary artery disease. Assessment for potential risk factor modification, dietary therapy or pharmacologic therapy may be warranted, if clinically indicated. 6. Additional incidental findings, as above. Electronically Signed   By: Trudie Reed M.D.   On: 12/13/2021 13:31    Cardiac Studies   TTE 12/13/2021  1. Left ventricular ejection fraction, by estimation, is 55 to 60%. The  left ventricle has normal function. The left ventricle has no regional  wall motion abnormalities. There is mild left ventricular hypertrophy of  the basal-septal segment. Left  ventricular diastolic parameters are consistent with Grade III diastolic  dysfunction (restrictive). Elevated left atrial pressure.   2. Right ventricular systolic function is normal. The right ventricular  size is mildly enlarged. There is mildly elevated pulmonary artery  systolic pressure.   3. Left atrial size was moderately dilated.   4. The  mitral valve is myxomatous. Moderate mitral valve regurgitation.  No evidence of mitral stenosis.   5. The aortic valve is tricuspid. Aortic valve regurgitation is not  visualized. Aortic valve sclerosis is present, with no evidence of aortic  valve stenosis.   6. The inferior vena cava is dilated in size with >50% respiratory  variability, suggesting right atrial pressure of 8 mmHg.  Patient Profile     76 year old male with a past medical history of chronic diastolic CHF, BPH, anemia chronic disease, stage IV CKD, type II DM, depression, hypertension, history of GI bleed, paroxysmal atrial fibrillation, secondary hyper parathyroidism, thyroid disease, history of cigarette smoking here with recurrent CHF exacerbation  Assessment & Plan     Acute on chronic diastolic CHF: NYHA III-IV  He has normal EF , grade III DD, moderate MR. He has Stage IV CKD. He had prior PYP that was equivocal for TTR amyloid. Low suspicion. He's had myeloma panel done prior that was not suggestive of AL amyloid. He was seen by HF 11/30/2021. He's managed with torsemide 20 mg BID. Off farxiga 2/2 GFR 14. He returns with overall FTT and decompensation. He had retained urine 700 cc contributing to his AKI. He does have elevated JVD and BNP  up from prior 1516->2005. If renal function does not improved with diuresis may need discussion with nephrology regarding whether IHD is reasonable; agree with palliative. Will engage heart failure if he is not improving as well. Blood pressures are ok, he is not very cool.  - crt 5.85->4.75->4.49->4.6 - I/O net positive.  - weight is low; Stage C may be nearing stage D - lactate was normal, he is not cold - continue lasix 80 mg IV BID , will add low dose metolazone goal closer to 2L net negative; Will give metolazone 5 mg (  increase). Restrict fluids 1500 cc - consider nephrology consult if renal fxn not improving. He is not a candidate for advanced HF therapies. Palliative is on board.  With his retroperitoneal  paraganglioma plan per the family meeting is to transfer him to [facility].   Paroxysmal Atrial Fibrillation/Flutter  Maintaining sinus rhythm. - Continue Amiodarone 200mg  daily. - Continue Eliquis 2.5mg  twice daily. Reduced dose due to renal function and weight.  Hyperlipidemia - Continue home Lipitor 80mg  daily.   CKD Stage IV Creatinine 4.75 on admission. Baseline around 3.8. - Would recommend consulting Nephrology.   Otherwise, per primary team: - Left retroperitoneal mass concerning for malignancy - Dysuria  - Type 2 diabetes  - Chronic anemia  For questions or updates, please contact [facility] Please consult [email or web address] for contact info under        Signed, Maisie Fus, MD  12/15/2021, 11:00 AM

## 2021-12-15 NOTE — Progress Notes (Signed)
Mobility Specialist Progress Note:   12/15/21 1046  Mobility  Activity Ambulated with assistance in hallway  Level of Assistance Minimal assist, patient does 75% or more  Assistive Device None  Distance Ambulated (ft) 120 ft  Activity Response Tolerated well  $Mobility charge 1 Mobility   Pt received in bed willing to participate in mobility. No complaints of pain. MinA to stand then stand-by throughout ambulation. Left in chair with call bell in reach and all needs met.   Adam Reid Mobility Specialist  

## 2021-12-16 ENCOUNTER — Inpatient Hospital Stay (HOSPITAL_COMMUNITY)
Admission: RE | Admit: 2021-12-16 | Discharge: 2021-12-16 | Disposition: A | Discharge status: Home / Self Care | Payer: Medicare Other | Source: Ambulatory Visit | Attending: Nephrology | Admitting: Nephrology

## 2021-12-16 DIAGNOSIS — I130 Hypertensive heart and chronic kidney disease with heart failure and stage 1 through stage 4 chronic kidney disease, or unspecified chronic kidney disease: Secondary | ICD-10-CM | POA: Diagnosis not present

## 2021-12-16 DIAGNOSIS — Z7189 Other specified counseling: Secondary | ICD-10-CM | POA: Diagnosis not present

## 2021-12-16 DIAGNOSIS — D447 Neoplasm of uncertain behavior of aortic body and other paraganglia: Secondary | ICD-10-CM | POA: Diagnosis not present

## 2021-12-16 DIAGNOSIS — N179 Acute kidney failure, unspecified: Secondary | ICD-10-CM | POA: Diagnosis not present

## 2021-12-16 LAB — CBC
HCT: 23.9 % — ABNORMAL LOW (ref 39.0–52.0)
Hemoglobin: 7.7 g/dL — ABNORMAL LOW (ref 13.0–17.0)
MCH: 27.5 pg (ref 26.0–34.0)
MCHC: 32.2 g/dL (ref 30.0–36.0)
MCV: 85.4 fL (ref 80.0–100.0)
Platelets: 298 10*3/uL (ref 150–400)
RBC: 2.8 MIL/uL — ABNORMAL LOW (ref 4.22–5.81)
RDW: 18.6 % — ABNORMAL HIGH (ref 11.5–15.5)
WBC: 8.6 10*3/uL (ref 4.0–10.5)
nRBC: 0 % (ref 0.0–0.2)

## 2021-12-16 LAB — BASIC METABOLIC PANEL
Anion gap: 17 — ABNORMAL HIGH (ref 5–15)
BUN: 107 mg/dL — ABNORMAL HIGH (ref 8–23)
CO2: 22 mmol/L (ref 22–32)
Calcium: 8.4 mg/dL — ABNORMAL LOW (ref 8.9–10.3)
Chloride: 92 mmol/L — ABNORMAL LOW (ref 98–111)
Creatinine, Ser: 4.97 mg/dL — ABNORMAL HIGH (ref 0.61–1.24)
GFR, Estimated: 12 mL/min — ABNORMAL LOW (ref >60)
Glucose, Bld: 117 mg/dL — ABNORMAL HIGH (ref 70–99)
Potassium: 3.8 mmol/L (ref 3.5–5.1)
Sodium: 131 mmol/L — ABNORMAL LOW (ref 135–145)

## 2021-12-16 LAB — GLUCOSE, CAPILLARY
Glucose-Capillary: 123 mg/dL — ABNORMAL HIGH (ref 70–99)
Glucose-Capillary: 134 mg/dL — ABNORMAL HIGH (ref 70–99)
Glucose-Capillary: 135 mg/dL — ABNORMAL HIGH (ref 70–99)
Glucose-Capillary: 223 mg/dL — ABNORMAL HIGH (ref 70–99)
Glucose-Capillary: 128 mg/dL — ABNORMAL HIGH (ref 70–99)
Glucose-Capillary: 376 mg/dL — ABNORMAL HIGH (ref 70–99)

## 2021-12-16 LAB — PREALBUMIN: Prealbumin: 15.7 mg/dL — ABNORMAL LOW (ref 18–38)

## 2021-12-16 MED ORDER — FUROSEMIDE 10 MG/ML IJ SOLN
80.0000 mg | Freq: Every day | INTRAMUSCULAR | Status: DC
Start: 2021-12-16 — End: 2021-12-16

## 2021-12-16 MED ORDER — FUROSEMIDE 10 MG/ML IJ SOLN: 80.0000 mg | Freq: Two times a day (BID) | INTRAMUSCULAR | Status: DC

## 2021-12-16 NOTE — Progress Notes (Signed)
PT Cancellation Note  Patient Details Name: Adam Reid MRN: [id number] DOB: August 21, 1945   Cancelled Treatment:    Reason Eval/Treat Not Completed: Other (comment).  Pt declines PT politely, feeling he is walking fine.  Recheck tomorrow with translation again to see if pt is having issues, but also asked with translation for him to tell nursing if any issues occur before then.   Ramond Dial 12/16/2021, 11:36 AM  Mee Hives, PT PhD Acute Rehab Dept. Number: [place]

## 2021-12-16 NOTE — Progress Notes (Signed)
Patient ID: Mael Delap, male   DOB: [birth year], 76 y.o.   MRN: [id number] S: No complaints O:BP (!) 126/57 (BP Location: Left Arm)   Pulse 81   Temp 97.9 F (36.6 C) (Oral)   Resp 18   Ht '5\' 6"'$  (1.676 m)   Wt 53 kg   SpO2 95%   BMI 18.86 kg/m   Intake/Output Summary (Last 24 hours) at 12/16/2021 1020 Last data filed at 12/16/2021 0526 Gross per 24 hour  Intake 340 ml  Output 1950 ml  Net -1610 ml   Intake/Output: I/O last 3 completed shifts: In: 65 [P.O.:720; IV Piggyback:100] Out: 2900 [Urine:2900]  Intake/Output this shift:  No intake/output data recorded. Weight change: 0.9 kg Gen: NAD CVS: RRR Resp: CTA Abd: +BS, soft, NT/Nd Ext: no edema  Recent Labs  Lab 12/10/21 1339 12/13/21 1011 12/14/21 0347 12/15/21 0426 12/16/21 0506  NA 141 137 138 133* 131*  K 3.7 3.7 3.7 4.2 3.8  CL 102 100 104 99 92*  CO2 21 21* '22 22 22  '$ GLUCOSE 98 290* 169* 88 117*  BUN 110* 106* 104* 99* 107*  CREATININE 4.85* 4.75* 4.49* 4.60* 4.97*  CALCIUM 8.8 8.6* 8.5* 8.3* 8.4*   Liver Function Tests: No results for input(s): AST, ALT, ALKPHOS, BILITOT, PROT, ALBUMIN in the last 168 hours. No results for input(s): LIPASE, AMYLASE in the last 168 hours. No results for input(s): AMMONIA in the last 168 hours. CBC: Recent Labs  Lab 12/13/21 1011 12/16/21 0506  WBC 12.4* 8.6  NEUTROABS 11.3*  --   HGB 9.6* 7.7*  HCT 29.5* 23.9*  MCV 84.5 85.4  PLT 386 298   Cardiac Enzymes: Recent Labs  Lab 12/13/21 1914  CKTOTAL 93   CBG: Recent Labs  Lab 12/15/21 1120 12/15/21 1643 12/16/21 0219 12/16/21 0628 12/16/21 0736  GLUCAP 316* 128* 123* 134* 135*    Iron Studies: No results for input(s): IRON, TIBC, TRANSFERRIN, FERRITIN in the last 72 hours. Studies/Results: No results found.  amiodarone  200 mg Oral Daily   apixaban  2.5 mg Oral BID   atorvastatin  80 mg Oral Daily   calcium acetate  667 mg Oral TID with meals   Chlorhexidine Gluconate Cloth  6 each Topical  Daily   citalopram  20 mg Oral Daily   diltiazem  180 mg Oral Daily   doxazosin  4 mg Oral Daily   finasteride  5 mg Oral Daily   furosemide  80 mg Intravenous Daily   insulin aspart  0-6 Units Subcutaneous TID WC   linagliptin  5 mg Oral Daily   metolazone  5 mg Oral Daily   sodium bicarbonate  650 mg Oral BID   sodium chloride flush  3 mL Intravenous Q12H    BMET    Component Value Date/Time   NA 131 (L) 12/16/2021 0506   NA 137 06/19/2020 0936   K 3.8 12/16/2021 0506   CL 92 (L) 12/16/2021 0506   CO2 22 12/16/2021 0506   GLUCOSE 117 (H) 12/16/2021 0506   BUN 107 (H) 12/16/2021 0506   BUN 51 (H) 06/19/2020 0936   CREATININE 4.97 (H) 12/16/2021 0506   CREATININE 2.68 (H) 03/26/2016 1839   CALCIUM 8.4 (L) 12/16/2021 0506   GFRNONAA 12 (L) 12/16/2021 0506   GFRAA 21 (L) 06/19/2020 0936   CBC    Component Value Date/Time   WBC 8.6 12/16/2021 0506   RBC 2.80 (L) 12/16/2021 0506   HGB 7.7 (L) 12/16/2021 7893  HGB 9.1 (L) 09/14/2019 1555   HCT 23.9 (L) 12/16/2021 0506   HCT 28.4 (L) 09/14/2019 1555   PLT 298 12/16/2021 0506   PLT 464 (H) 09/14/2019 1555   MCV 85.4 12/16/2021 0506   MCV 82 09/14/2019 1555   MCH 27.5 12/16/2021 0506   MCHC 32.2 12/16/2021 0506   RDW 18.6 (H) 12/16/2021 0506   RDW 16.4 (H) 09/14/2019 1555   LYMPHSABS 0.4 (L) 12/13/2021 1011   LYMPHSABS 0.6 (L) 10/05/2017 1433   MONOABS 0.5 12/13/2021 1011   EOSABS 0.1 12/13/2021 1011   EOSABS 0.1 10/05/2017 1433   BASOSABS 0.1 12/13/2021 1011   BASOSABS 0.0 10/05/2017 1433    Assessment/Plan:  AKI/CKD stage IV - presumably combination of urinary retention and cardiorenal syndrome in the setting of acute on chronic CHF.  BUN/Cr are slowly improving with diuresis and foley catheter but up slightly today.  No indication for hemodialysis.  He normally follows with Dr. [name] at [facility] and will need hospital follow up after discharge.  Avoid nephrotoxic agents such as IV contrast, NSAIDs, phosphorous  containing bowel preps.  Renal dose meds. Continue to follow UOP and daily Scr.  Stop lasix 80 mg IV daily due to rise in Scr Had recurrent urinary retention now with foley replaced. Acute on chronic diastolic CHF - improved with IV lasix 80 mg bid.  Palliative care following for worsening cardiac function and FTT. P. 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  Surgery recommends transfer to tertiary care center given high risk.  Declined for transfer but recommended to start alpha blocking agent.  Will need outpatient follow up. Urinary retention - foley replaced.  Predated hospitalization.  DM type 2 - per primary HTN - stable Anemia of CKD stage IV - follow H/H.  Hold off on ESA given recent diagnosis of paraganglioma.  Will defer management to Oncology.   Donetta Potts, MD [facility]

## 2021-12-16 NOTE — Progress Notes (Signed)
Patient ID: Adam Reid, male   DOB: 20-Jun-1946, 76 y.o.   MRN: [id number]    Progress Note from the Palliative Medicine Team at [facility]   Patient Name: Adam Reid        Date: 12/16/2021 DOB: 11-02-1945  Age: 76 y.o. MRN#: [id number] Attending Physician: Domenic Polite, MD Primary Care Physician: [name], MD Admit Date: 12/13/2021   Medical records reviewed ,  discussed case with attending  Per intake H&P -->  76 year old male with a past medical history of chronic diastolic CHF, BPH, anemia chronic disease, stage IV CKD, type II DM, depression, hypertension, history of GI bleed, paroxysmal atrial fibrillation, secondary hyper parathyroidism, thyroid disease, history of cigarette smoking who presented to the emergency department with urinary burning, dyspnea and progressively worse edema.  Work-up shows BNP of over 2000 pg/mL, worsening renal function and retroperitoneal mass.  The case was discussed with me by Dr. Kathrynn Humble.  The transfer was accepted to either [facility] facility.  He subsequently spoke to cardiology and they requested a cardiac telemetry bed at [facility].   Palliative care asked to get involved in the setting of worsening kidney function and large left retroperitoneal paraglioma.   This NP visited patient at the bedside as a follow up to for palliative medicine needs and emotional support, and to meet with family as scheduled for ongoing conversation regarding goals of care, treatment option decisions and advance care planning.  Today patient is being and appears comfortable, I did not disturb him but I placed a phone call and spoke to wife/ [name].  Continued education offered on the complex current medical situation; chronic kidney disease stage IV, acute on chronic congestive heart failure, A-fib a flutter, paraganglioma, urinary retention, DM, HTN, anemia of chronic disease.   I verbalized my concern for patient's overall continued failure to  thrive and high risk for decompensation.  Conversation had regarding yesterday's exploration of inpatient transfer to [facility], and Dr. Bonner Puna is conversation with endocrinology and surgical oncology at [facility].  No recommendation for inpatient transfer at this time,  suggestion is outpatient follow-up close to discharge.  Wife tells me they have a follow-up appointment for June 15.  At this time patient and family are open to all offered and available medical interventions to prolong life.    Education offered today regarding  the importance of continued conversation with family and their  medical providers regarding overall plan of care and treatment options,  ensuring decisions are within the context of the patients values and GOCs.  Questions and concerns addressed   Discussed with Dr [name]  This nurse practitioner informed  the family and the attending that I will be out of the hospital until Sunday  morning.  If the patient is still hospitalized I will follow-up at that time.  Call palliative medicine team phone # [phone] with questions or concerns in the interim.      Wadie Lessen NP  Palliative Medicine Team Team Phone # [phone] Pager [phone]

## 2021-12-16 NOTE — Progress Notes (Signed)
Mobility Specialist Progress Note:   12/16/21 1632  Mobility  Activity Ambulated with assistance in hallway  Level of Assistance Standby assist, set-up cues, supervision of patient - no hands on  Assistive Device Front wheel walker  Distance Ambulated (ft) 120 ft  Activity Response Tolerated well  $Mobility charge 1 Mobility   Pt received in bed willing to participate in mobility. No complaints of pain. Left EOB with call bell in reach and all needs met.   [facility] Karely Hurtado Mobility Specialist

## 2021-12-16 NOTE — Progress Notes (Signed)
PROGRESS NOTE    Adam Reid  EHM:[id number] DOB: [birth year] DOA: 12/13/2021 PCP: [name], MD  76 y.o. 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 CT demonstrated enlargement of retroperitoneal mass. The patient was found to be retaining urine, so foley catheter was placed and he was admitted for AKI on stage IV CKD with nephrology, cardiology, and palliative care consulted. Renal function is stable with diuresis. Patient requested discontinuation of foley but has persistently showed evidence of retention, so coude catheter was placed 6/6.  -Seen by oncology, recommended evaluation by surgical oncology -Overall prognosis felt to be poor, s/p Palliative care meeting with patient and family 6/6 with determination that the patient would like no restrictions to medical care, remains full code with hope to pursue measures with curative intent.  -Dr.Grunz discussed in detail with endocrinology consultant and surgical oncology at [facility] on 6/6 who recommended the work up as ordered and follow up near after discharge. Did not recommend inpatient transfer for management of paraganglioma.    Subjective: -Poor appetite, some nausea, no vomiting  Assessment and Plan:  Acute on chronic HFpEF: Echo shows LVEF 55-60%, mild basal-septal LVH and grade III diastolic dysfunction, elevated PASP and RV enlargement, IVC dilated. Moderate MR. -Per cards amyloidosis less likely based on prior work-up -Diuresing on IV Lasix, kidney function remains borderline, cardiology and nephrology following, creatinine slightly worse, BUN trending up -He is 2.5 L negative -GDMT limited by CKD   AKI on stage IV CKD:  -Secondary to cardiorenal syndrome and urinary retention -Now back with a  catheter -Nephrology following, has a mature right arm aVF to start dialysis should kidney function continue to worsen, some marginal uremic symptoms noted as well   Acute urinary retention: BPH noted on prior imaging - Patient requested discontinuation of foley due to pain despite lidocaine and oral medications.  Recurrent urinary retention this admission, replaced with a coud catheter 6/6  -Now started on Cardura in the setting of paraganglioma as well   Retroperitoneal paraganglioma: Enlarging. 6.7 x 5.4 cm less than a month ago, now 6.9 x 6.1 x 7.9 cm. Location abuts the infrarenal aorta. Contrast would likely be helpful to delineate this better but cannot currently be used.  - D/w oncology who recommended endocrinology and surgical evaluation. Surgery locally was contacted and deferred to tertiary care centers.  Dr. Bonner Puna discussed with Dr. Cam Hai who is an endocrine surgeon at [place]. He recommended starting doxazosin and continuing this until follow up. He will have his office reach out to patient's wife to schedule follow up next week. Of note, the enlargement seen on repeat CT may be related to bleeding from biopsy and not true tumor proliferation. He specifically stated he would not recommend any surgical intervention at this time, so transfer for this reason would be futile.  -Dr. Bonner Puna also discussed with endocrinology, Dr. Buddy Duty, who confirmed work up with urine metanephrines, urine catecholamines, and plasma metanephrines is the appropriate next step in hopes of determining whether this is functioning vs. not hormonally actively functioning tumor. No further recommendations acutely, but will be happy to follow up with the patient after discharge.    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  - No  ESA with malignancy. Of note, had an appointment scheduled as outpatient for feraheme and retacrit on 6/7. -Given IV  iron   Leukocytosis, pyuria, urinary retention:  - Treat empirically for UTI with CTX while awaiting culture.  - There is abnormality on CT chest, ground glass attenuation w/septal thickening, though pt has not focal symptoms of pneumonia, monitor clinically   T2DM:  -CBGs better controlled now - Very sensitive SSI given his malnutrition and renal impairment.  - Continue formulary -gliptin, hold [facility]    Moderate protein calorie malnutrition: Body mass index is 18.54 kg/m. Suspect significant catabolic state given enlarging mass with central necrosis.  - Supplement protein as much as possible.    Bilateral pleural effusions:  - Diuresis as above   PAF/flutter: Currently in NSR. Last TSH 4.9. - Continue eliquis - Continue diltiazem, amiodarone   CAD, aortic atherosclerosis: No anginal complaints - Continue statin, anticoagulation for now.    Depression: Quiescent - Continue SSRI  Goals: Overall prognosis is felt to be poor in the setting of CHF, CKD4-5, severe malnutrition, large retroperitoneal paraganglioma, ongoing failure to thrive.  Discussed poor prognosis with the patient again today. -Seen by palliative care 6/6, 6/7: Poor prognosis discussed then as well, patient and family wishes to remain full code with full scope of treatment    DVT prophylaxis: Code Status:  Family Communication: Disposition Plan:   Consultants:    Procedures:   Antimicrobials:    Objective: Vitals:   12/15/21 2040 12/15/21 2200 12/16/21 0525 12/16/21 1121  BP: (!) 106/48 (!) 124/52 (!) 126/57 133/63  Pulse:  (!) 58 81   Resp: '20 15 18   '$ Temp: 97.9 F (36.6 C)  97.9 F (36.6 C) 97.8 F (36.6 C)  TempSrc: Oral  Oral Oral  SpO2: 93% 93% 95%   Weight:   53 kg   Height:        Intake/Output Summary (Last 24 hours) at 12/16/2021 1346 Last data filed at 12/16/2021 0526 Gross per 24 hour  Intake 100 ml  Output 1950 ml  Net -1850 ml   Filed Weights   12/14/21 0417 12/15/21 0530  12/16/21 0525  Weight: 52.1 kg 52.1 kg 53 kg    Examination:  General exam: Chronically ill cachectic male sitting up in bed, AAOx3, no distress HEENT: Positive JVD CVS: S1-S2, regular rhythm Lungs: Rare basilar rales otherwise clear Abdomen: Soft, nontender, bowel sounds present Extremities: No edema Skin: No rashes Psychiatry:  Mood & affect appropriate.     Data Reviewed:   CBC: Recent Labs  Lab 12/13/21 1011 12/16/21 0506  WBC 12.4* 8.6  NEUTROABS 11.3*  --   HGB 9.6* 7.7*  HCT 29.5* 23.9*  MCV 84.5 85.4  PLT 386 283   Basic Metabolic Panel: Recent Labs  Lab 12/10/21 1339 12/13/21 1011 12/14/21 0347 12/15/21 0426 12/16/21 0506  NA 141 137 138 133* 131*  K 3.7 3.7 3.7 4.2 3.8  CL 102 100 104 99 92*  CO2 21 21* '22 22 22  '$ GLUCOSE 98 290* 169* 88 117*  BUN 110* 106* 104* 99* 107*  CREATININE 4.85* 4.75* 4.49* 4.60* 4.97*  CALCIUM 8.8 8.6* 8.5* 8.3* 8.4*  MG  --  2.1  --   --   --    GFR: Estimated Creatinine Clearance: 9.8 mL/min (A) (by C-G formula based on SCr of 4.97 mg/dL (H)). Liver Function Tests: No results for input(s): AST, ALT, ALKPHOS, BILITOT, PROT, ALBUMIN in the last 168 hours. No results for input(s):  LIPASE, AMYLASE in the last 168 hours. No results for input(s): AMMONIA in the last 168 hours. Coagulation Profile: No results for input(s): INR, PROTIME in the last 168 hours. Cardiac Enzymes: Recent Labs  Lab 12/13/21 1914  CKTOTAL 93   BNP (last 3 results) Recent Labs    12/10/21 1339  PROBNP 12,645*   HbA1C: No results for input(s): HGBA1C in the last 72 hours. CBG: Recent Labs  Lab 12/15/21 1643 12/16/21 0219 12/16/21 0628 12/16/21 0736 12/16/21 1119  GLUCAP 128* 123* 134* 135* 223*   Lipid Profile: No results for input(s): CHOL, HDL, LDLCALC, TRIG, CHOLHDL, LDLDIRECT in the last 72 hours. Thyroid Function Tests: No results for input(s): TSH, T4TOTAL, FREET4, T3FREE, THYROIDAB in the last 72 hours. Anemia Panel: No  results for input(s): VITAMINB12, FOLATE, FERRITIN, TIBC, IRON, RETICCTPCT in the last 72 hours. Urine analysis:    Component Value Date/Time   COLORURINE STRAW (A) 12/13/2021 1759   APPEARANCEUR CLEAR 12/13/2021 1759   LABSPEC 1.008 12/13/2021 1759   PHURINE 5.0 12/13/2021 1759   GLUCOSEU 50 (A) 12/13/2021 1759   HGBUR SMALL (A) 12/13/2021 1759   BILIRUBINUR NEGATIVE 12/13/2021 1759   BILIRUBINUR negative 03/02/2021 1458   BILIRUBINUR neg 03/23/2015 1404   KETONESUR NEGATIVE 12/13/2021 1759   PROTEINUR 30 (A) 12/13/2021 1759   UROBILINOGEN 0.2 03/02/2021 1458   NITRITE NEGATIVE 12/13/2021 1759   LEUKOCYTESUR NEGATIVE 12/13/2021 1759   Sepsis Labs: '@LABRCNTIP'$ (procalcitonin:4,lacticidven:4)  ) Recent Results (from the past 240 hour(s))  Urine Culture     Status: None   Collection Time: 12/14/21  6:41 AM   Specimen: In/Out Cath Urine  Result Value Ref Range Status   Specimen Description IN/OUT CATH URINE  Final   Special Requests NONE  Final   Culture   Final    NO GROWTH Performed at [facility], [place] [address] [place], [place] [address]    Report Status 12/15/2021 FINAL  Final     Radiology Studies: No results found.   Scheduled Meds:  amiodarone  200 mg Oral Daily   apixaban  2.5 mg Oral BID   atorvastatin  80 mg Oral Daily   calcium acetate  667 mg Oral TID with meals   Chlorhexidine Gluconate Cloth  6 each Topical Daily   citalopram  20 mg Oral Daily   diltiazem  180 mg Oral Daily   doxazosin  4 mg Oral Daily   finasteride  5 mg Oral Daily   insulin aspart  0-6 Units Subcutaneous TID WC   linagliptin  5 mg Oral Daily   metolazone  5 mg Oral Daily   sodium bicarbonate  650 mg Oral BID   sodium chloride flush  3 mL Intravenous Q12H   Continuous Infusions:  sodium chloride     ferric gluconate (FERRLECIT) IVPB Stopped (12/15/21 2240)     LOS: 3 days    Time spent: 23mn   PDomenic Polite MD [facility]   12/16/2021, 1:46 PM

## 2021-12-16 NOTE — Progress Notes (Signed)
Progress Note  Patient Name: Adam Reid Date of Encounter: 12/16/2021  [facility] Cardiologist: Elouise Munroe, MD transferred to heart failure  Subjective   Feels well, and he slept well. He still wants aggressive therapy  Inpatient Medications    Scheduled Meds:  amiodarone  200 mg Oral Daily   apixaban  2.5 mg Oral BID   atorvastatin  80 mg Oral Daily   calcium acetate  667 mg Oral TID with meals   Chlorhexidine Gluconate Cloth  6 each Topical Daily   citalopram  20 mg Oral Daily   diltiazem  180 mg Oral Daily   doxazosin  4 mg Oral Daily   finasteride  5 mg Oral Daily   furosemide  80 mg Intravenous BID   insulin aspart  0-6 Units Subcutaneous TID WC   linagliptin  5 mg Oral Daily   metolazone  5 mg Oral Daily   sodium bicarbonate  650 mg Oral BID   sodium chloride flush  3 mL Intravenous Q12H   Continuous Infusions:  sodium chloride     cefTRIAXone (ROCEPHIN)  IV 1 g (12/15/21 0830)   ferric gluconate (FERRLECIT) IVPB Stopped (12/15/21 2240)   PRN Meds: sodium chloride, acetaminophen, hydrALAZINE, HYDROmorphone, ondansetron (ZOFRAN) IV, polyethylene glycol, senna, sodium chloride flush   Vital Signs    Vitals:   12/15/21 1043 12/15/21 2040 12/15/21 2200 12/16/21 0525  BP: (!) 162/84 (!) 106/48 (!) 124/52 (!) 126/57  Pulse: 70  (!) 58 81  Resp: '18 20 15 18  '$ Temp: 97.6 F (36.4 C) 97.9 F (36.6 C)  97.9 F (36.6 C)  TempSrc: Oral Oral  Oral  SpO2: 94% 93% 93% 95%  Weight:    53 kg  Height:        Intake/Output Summary (Last 24 hours) at 12/16/2021 0859 Last data filed at 12/16/2021 0526 Gross per 24 hour  Intake 340 ml  Output 1950 ml  Net -1610 ml      12/16/2021    5:25 AM 12/15/2021    5:30 AM 12/14/2021    4:17 AM  Last 3 Weights  Weight (lbs) 116 lb 13.5 oz 114 lb 13.8 oz 114 lb 13.8 oz  Weight (kg) 53 kg 52.1 kg 52.1 kg      Telemetry    NSR - Personally Reviewed  ECG    NA - Personally Reviewed  Physical Exam   Vitals:    12/15/21 2200 12/16/21 0525  BP: (!) 124/52 (!) 126/57  Pulse: (!) 58 81  Resp: 15 18  Temp:  97.9 F (36.6 C)  SpO2: 93% 95%     GEN: No acute distress.   Neck: ++ JVD ~ 15 cm Cardiac: RRR, + holosystolic murmur radiating to the apex, rubs, or gallops.  Respiratory: Clear to auscultation bilaterally. GI: Soft, nontender, non-distended  MS: No edema; No deformity. Neuro:  Nonfocal  Ext: no longer has pitting edema Psych: Normal affect   Labs    High Sensitivity Troponin:  No results for input(s): TROPONINIHS in the last 720 hours.   Chemistry Recent Labs  Lab 12/13/21 1011 12/14/21 0347 12/15/21 0426 12/16/21 0506  NA 137 138 133* 131*  K 3.7 3.7 4.2 3.8  CL 100 104 99 92*  CO2 21* '22 22 22  '$ GLUCOSE 290* 169* 88 117*  BUN 106* 104* 99* 107*  CREATININE 4.75* 4.49* 4.60* 4.97*  CALCIUM 8.6* 8.5* 8.3* 8.4*  MG 2.1  --   --   --  GFRNONAA 12* 13* 13* 12*  ANIONGAP 16* 12 12 17*    Lipids No results for input(s): CHOL, TRIG, HDL, LABVLDL, LDLCALC, CHOLHDL in the last 168 hours.  Hematology Recent Labs  Lab 12/13/21 1011 12/16/21 0506  WBC 12.4* 8.6  RBC 3.49* 2.80*  HGB 9.6* 7.7*  HCT 29.5* 23.9*  MCV 84.5 85.4  MCH 27.5 27.5  MCHC 32.5 32.2  RDW 18.7* 18.6*  PLT 386 298   Thyroid No results for input(s): TSH, FREET4 in the last 168 hours.  BNP Recent Labs  Lab 12/10/21 1339 12/13/21 1011  BNP  --  2,005.0*  PROBNP 12,645*  --     DDimer No results for input(s): DDIMER in the last 168 hours.   Radiology    No results found.  Cardiac Studies   TTE 12/13/2021  1. Left ventricular ejection fraction, by estimation, is 55 to 60%. The  left ventricle has normal function. The left ventricle has no regional  wall motion abnormalities. There is mild left ventricular hypertrophy of  the basal-septal segment. Left  ventricular diastolic parameters are consistent with Grade III diastolic  dysfunction (restrictive). Elevated left atrial pressure.    2. Right ventricular systolic function is normal. The right ventricular  size is mildly enlarged. There is mildly elevated pulmonary artery  systolic pressure.   3. Left atrial size was moderately dilated.   4. 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tion.  No evidence of mitral stenosis.   5. The aortic valve is tricuspid. Aortic valve regurgitation is not  visualized. Aortic valve sclerosis is present, with no evidence of aortic  valve stenosis.   6. The inferior vena cava is dilated in size with >50% respiratory  variability, suggesting right atrial pressure of 8 mmHg.  Patient Profile     76 year old male with a past medical history of chronic diastolic CHF, BPH, anemia chronic disease, stage IV CKD, type II DM, depression, hypertension, history of GI bleed, paroxysmal atrial fibrillation, secondary hyper parathyroidism, thyroid disease, history of cigarette smoking here with recurrent CHF exacerbation  Assessment & Plan    Acute on chronic diastolic CHF: NYHA III-IV  He has normal EF , grade III DD, moderate MR. He has Stage IV CKD. He had prior PYP that was equivocal for TTR amyloid. Low suspicion. He's had myeloma panel done prior that was not suggestive of AL amyloid. He was seen by HF 11/30/2021. He's managed with torsemide 20 mg BID. Off farxiga 2/2 GFR 14. He returns with overall FTT and decompensation. He had retained urine 700 cc contributing to his AKI. He does have elevated JVD and BNP  up from prior 1516->2005. If renal function does not improved with diuresis may need discussion with nephrology regarding whether IHD is reasonable; agree with palliative. Will engage heart failure if he is not improving as well. Blood pressures are ok, he is not very cool.  - crt 5.85->4.75->4.49->4.6->4.97 - I/O net positive.  - weight is low; Stage C may be nearing stage D - lactate was normal, he is not cold - Palliative is on board. Family and patient strongly would  like to pursue aggressive therapy  Mitral Regurgitation: moderate mitral regurgitation, mitral valve is myxomatous.  He has good afterload reduction. Continue to diurese  AKI on CKD Stage IV. multi-factorial causes of AKI. Cardio-renal, post-renal with urinary retention. Baseline crt 3.8. With mild crt increase, plan to wean to lasix 80 mg IV daily. Will defer diuresis to nephrology  Paroxysmal  Atrial Fibrillation/Flutter  Maintaining sinus rhythm. - Continue Amiodarone '200mg'$  daily. - Continue Eliquis 2.'5mg'$  twice daily. Reduced dose due to renal function and weight.  Hyperlipidemia - Continue home Lipitor '80mg'$  daily.   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

## 2021-12-16 NOTE — Care Management Important Message (Signed)
Important Message  Patient Details  Name: Adam Reid MRN: [id number] Date of Birth: 1945/11/16   Medicare Important Message Given:  Yes     Shelda Altes 12/16/2021, 7:53 AM

## 2021-12-17 ENCOUNTER — Ambulatory Visit: Payer: Medicare Other | Admitting: Family Medicine

## 2021-12-17 DIAGNOSIS — I130 Hypertensive heart and chronic kidney disease with heart failure and stage 1 through stage 4 chronic kidney disease, or unspecified chronic kidney disease: Secondary | ICD-10-CM | POA: Diagnosis not present

## 2021-12-17 LAB — CREATININE, URINE, RANDOM: Creatinine, Urine: 22.78 mg/dL

## 2021-12-17 LAB — URINALYSIS, ROUTINE W REFLEX MICROSCOPIC
Bacteria, UA: NONE SEEN
Bilirubin Urine: NEGATIVE
Glucose, UA: 50 mg/dL — AB
Ketones, ur: NEGATIVE mg/dL
Nitrite: NEGATIVE
Protein, ur: 30 mg/dL — AB
Specific Gravity, Urine: 1.009 (ref 1.005–1.030)
pH: 5 (ref 5.0–8.0)

## 2021-12-17 LAB — GLUCOSE, CAPILLARY
Glucose-Capillary: 176 mg/dL — ABNORMAL HIGH (ref 70–99)
Glucose-Capillary: 250 mg/dL — ABNORMAL HIGH (ref 70–99)
Glucose-Capillary: 123 mg/dL — ABNORMAL HIGH (ref 70–99)
Glucose-Capillary: 286 mg/dL — ABNORMAL HIGH (ref 70–99)

## 2021-12-17 LAB — SODIUM, URINE, RANDOM: Sodium, Ur: 68 mmol/L

## 2021-12-17 LAB — RENAL FUNCTION PANEL
Albumin: 2.8 g/dL — ABNORMAL LOW (ref 3.5–5.0)
Anion gap: 14 (ref 5–15)
BUN: 108 mg/dL — ABNORMAL HIGH (ref 8–23)
CO2: 22 mmol/L (ref 22–32)
Calcium: 8.4 mg/dL — ABNORMAL LOW (ref 8.9–10.3)
Chloride: 96 mmol/L — ABNORMAL LOW (ref 98–111)
Creatinine, Ser: 5.02 mg/dL — ABNORMAL HIGH (ref 0.61–1.24)
GFR, Estimated: 11 mL/min — ABNORMAL LOW (ref >60)
Glucose, Bld: 153 mg/dL — ABNORMAL HIGH (ref 70–99)
Phosphorus: 8 mg/dL — ABNORMAL HIGH (ref 2.5–4.6)
Potassium: 4.4 mmol/L (ref 3.5–5.1)
Sodium: 132 mmol/L — ABNORMAL LOW (ref 135–145)

## 2021-12-17 LAB — CBC
HCT: 23.5 % — ABNORMAL LOW (ref 39.0–52.0)
Hemoglobin: 7.7 g/dL — ABNORMAL LOW (ref 13.0–17.0)
MCH: 28.3 pg (ref 26.0–34.0)
MCHC: 32.8 g/dL (ref 30.0–36.0)
MCV: 86.4 fL (ref 80.0–100.0)
Platelets: 276 10*3/uL (ref 150–400)
RBC: 2.72 MIL/uL — ABNORMAL LOW (ref 4.22–5.81)
RDW: 18.6 % — ABNORMAL HIGH (ref 11.5–15.5)
WBC: 10.8 10*3/uL — ABNORMAL HIGH (ref 4.0–10.5)
nRBC: 0 % (ref 0.0–0.2)

## 2021-12-17 NOTE — Plan of Care (Signed)
Will defer diuretic plan to nephrology. No further recommendations. Cardiology will sign off. Thank  you for the consult, don't hesitate to reach out for further questions

## 2021-12-17 NOTE — Progress Notes (Signed)
Patient ID: Adam Reid, male   DOB: [birth year], 76 y.o.   MRN: [id number] S: No events overnight and no new complaints. O:BP (!) 142/51 (BP Location: Left Wrist)   Pulse 64   Temp 98.3 F (36.8 C) (Oral)   Resp 19   Ht '5\' 6"'$  (1.676 m)   Wt 51.7 kg   SpO2 98%   BMI 18.40 kg/m   Intake/Output Summary (Last 24 hours) at 12/17/2021 0931 Last data filed at 12/17/2021 0825 Gross per 24 hour  Intake 600 ml  Output 1350 ml  Net -750 ml   Intake/Output: I/O last 3 completed shifts: In: 240 [P.O.:240] Out: 2750 [Urine:2750]  Intake/Output this shift:  Total I/O In: 360 [P.O.:360] Out: -  Weight change: -1.3 kg Gen:NAD CVS: RRR, no rub III/VI SEM at apex Resp:CTA Abd: +BS, soft, NT/ND Ext: no edema  Recent Labs  Lab 12/10/21 1339 12/13/21 1011 12/14/21 0347 12/15/21 0426 12/16/21 0506 12/17/21 0402  NA 141 137 138 133* 131* 132*  K 3.7 3.7 3.7 4.2 3.8 4.4  CL 102 100 104 99 92* 96*  CO2 21 21* '22 22 22 22  '$ GLUCOSE 98 290* 169* 88 117* 153*  BUN 110* 106* 104* 99* 107* 108*  CREATININE 4.85* 4.75* 4.49* 4.60* 4.97* 5.02*  ALBUMIN  --   --   --   --   --  2.8*  CALCIUM 8.8 8.6* 8.5* 8.3* 8.4* 8.4*  PHOS  --   --   --   --   --  8.0*   Liver Function Tests: Recent Labs  Lab 12/17/21 0402  ALBUMIN 2.8*   No results for input(s): "LIPASE", "AMYLASE" in the last 168 hours. No results for input(s): "AMMONIA" in the last 168 hours. CBC: Recent Labs  Lab 12/13/21 1011 12/16/21 0506 12/17/21 0402  WBC 12.4* 8.6 10.8*  NEUTROABS 11.3*  --   --   HGB 9.6* 7.7* 7.7*  HCT 29.5* 23.9* 23.5*  MCV 84.5 85.4 86.4  PLT 386 298 276   Cardiac Enzymes: Recent Labs  Lab 12/13/21 1914  CKTOTAL 93   CBG: Recent Labs  Lab 12/16/21 0736 12/16/21 1119 12/16/21 1600 12/16/21 2105 12/17/21 0606  GLUCAP 135* 223* 128* 376* 176*    Iron Studies: No results for input(s): "IRON", "TIBC", "TRANSFERRIN", "FERRITIN" in the last 72 hours. Studies/Results: No results  found.  amiodarone  200 mg Oral Daily   apixaban  2.5 mg Oral BID   atorvastatin  80 mg Oral Daily   calcium acetate  667 mg Oral TID with meals   Chlorhexidine Gluconate Cloth  6 each Topical Daily   citalopram  20 mg Oral Daily   diltiazem  180 mg Oral Daily   doxazosin  4 mg Oral Daily   finasteride  5 mg Oral Daily   insulin aspart  0-6 Units Subcutaneous TID WC   linagliptin  5 mg Oral Daily   metolazone  5 mg Oral Daily   sodium bicarbonate  650 mg Oral BID   sodium chloride flush  3 mL Intravenous Q12H    BMET    Component Value Date/Time   NA 132 (L) 12/17/2021 0402   NA 137 06/19/2020 0936   K 4.4 12/17/2021 0402   CL 96 (L) 12/17/2021 0402   CO2 22 12/17/2021 0402   GLUCOSE 153 (H) 12/17/2021 0402   BUN 108 (H) 12/17/2021 0402   BUN 51 (H) 06/19/2020 0936   CREATININE 5.02 (H) 12/17/2021 0402   CREATININE  2.68 (H) 03/26/2016 1839   CALCIUM 8.4 (L) 12/17/2021 0402   GFRNONAA 11 (L) 12/17/2021 0402   GFRAA 21 (L) 06/19/2020 0936   CBC    Component Value Date/Time   WBC 10.8 (H) 12/17/2021 0402   RBC 2.72 (L) 12/17/2021 0402   HGB 7.7 (L) 12/17/2021 0402   HGB 9.1 (L) 09/14/2019 1555   HCT 23.5 (L) 12/17/2021 0402   HCT 28.4 (L) 09/14/2019 1555   PLT 276 12/17/2021 0402   PLT 464 (H) 09/14/2019 1555   MCV 86.4 12/17/2021 0402   MCV 82 09/14/2019 1555   MCH 28.3 12/17/2021 0402   MCHC 32.8 12/17/2021 0402   RDW 18.6 (H) 12/17/2021 0402   RDW 16.4 (H) 09/14/2019 1555   LYMPHSABS 0.4 (L) 12/13/2021 1011   LYMPHSABS 0.6 (L) 10/05/2017 1433   MONOABS 0.5 12/13/2021 1011   EOSABS 0.1 12/13/2021 1011   EOSABS 0.1 10/05/2017 1433   BASOSABS 0.1 12/13/2021 1011   BASOSABS 0.0 10/05/2017 1433    Assessment/Plan:  AKI/CKD stage IV - presumably combination of urinary retention and cardiorenal syndrome in the setting of acute on chronic CHF.  BUN/Cr are slowly rising despite foley catheter and stopping IV lasix.  No indication for hemodialysis, however we may  need to discuss the possible need for HD if his renal function continues to worsen.  He normally follows with Dr. [name] at [facility] and will need hospital follow up after discharge.  Avoid nephrotoxic agents such as IV contrast, NSAIDs, phosphorous containing bowel preps.  Renal dose meds. Continue to follow UOP and daily Scr.  Stopped lasix 80 mg IV daily due to rise in Scr on 12/16/21 but he remains negative with I&O's.  Will send urine studies. Acute on chronic diastolic CHF - improved with IV lasix 80 mg bid.  Palliative care following for worsening cardiac function and FTT. P. 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  Surgery recommends transfer to tertiary care center given high risk.  Declined for transfer but recommended to start alpha blocking agent.  Will need outpatient follow up. Urinary retention - foley replaced.  Predated hospitalization.  DM type 2 - per primary HTN - stable Anemia of CKD stage IV - follow H/H.  Hold off on ESA given recent diagnosis of paraganglioma.  Will defer management to Oncology.  Donetta Potts, MD [facility]

## 2021-12-17 NOTE — Progress Notes (Addendum)
PROGRESS NOTE    Adam Reid  RWE:[id number] DOB: 1945-09-14 DOA: 12/13/2021 PCP: [name], MD  76 y.o. 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 CT demonstrated enlargement of retroperitoneal mass. The patient was found to be retaining urine, so foley catheter was placed and he was admitted for AKI on stage IV CKD with nephrology, cardiology, and palliative care consulted. Renal function is stable with diuresis. Patient requested discontinuation of foley but has persistently showed evidence of retention, so coude catheter was placed 6/6.  -Seen by oncology, recommended evaluation by surgical oncology -Overall prognosis felt to be poor, s/p Palliative care meeting with patient and family 6/6 with determination that the patient would like no restrictions to medical care,  hope to pursue measures with curative intent.  -Dr.Grunz discussed in detail with endocrinology consultant and surgical oncology at [facility] on 6/6 who recommended the work up as ordered and follow up after discharge. Did not recommend inpatient transfer for management of paraganglioma.    Subjective: -Continues to have intermittent nausea and anorexia, no vomiting, some dyspnea with activity  Assessment and Plan:  Acute on chronic HFpEF: Echo shows LVEF 55-60%, mild basal-septal LVH and grade III diastolic dysfunction, elevated PASP and RV enlargement, IVC dilated. Moderate MR. -Per cards amyloidosis less likely based on prior work-up -Diuresed with IV Lasix initially, diuretics on hold since yesterday, creatinine up to 5 now, BUN is 108 with some uremic symptoms -He is 3.6 L negative -GDMT limited by CKD   AKI on stage IV CKD:  -Secondary to cardiorenal syndrome and urinary retention -Now  back with a coud catheter for recurrent retention -Nephrology following, diuretics on hold, BUN and creatinine with mild worsening, has some marginal uremic symptoms as well   Acute urinary retention: BPH noted on prior imaging - Patient requested discontinuation of foley due to pain despite lidocaine and oral medications.  Recurrent urinary retention this admission, replaced with a coud catheter 6/6  -Now started on Cardura in the setting of paraganglioma as well   Retroperitoneal paraganglioma: Enlarging. 6.7 x 5.4 cm less than a month ago, now 6.9 x 6.1 x 7.9 cm. Location abuts the infrarenal aorta.  - D/w oncology who recommended endocrinology and surgical evaluation. Surgery locally was contacted and deferred to tertiary care centers.  Dr. Bonner Puna discussed with Dr. Cam Hai who is an endocrine surgeon at [place]. He recommended starting doxazosin and continuing this until follow up. He will have his office reach out to patient's wife to schedule follow up next week. Of note, the enlargement seen on repeat CT may be related to bleeding from biopsy and not true tumor proliferation. Dr.Randle specifically stated he would not recommend any surgical intervention at this time, so transfer for this reason would be futile.  -Dr. Bonner Puna also discussed with endocrinology, Dr. Buddy Duty, who confirmed work up with urine metanephrines, urine catecholamines, and plasma metanephrines is the appropriate next step in hopes of determining whether this is functioning vs. not hormonally actively functioning tumor. No further recommendations acutely, but will be happy to follow up with the patient after discharge.    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  - No ESA with malignancy. Of note, had an appointment scheduled  as outpatient for feraheme and retacrit on 6/7. -Given IV iron 6/6, 6/7   Leukocytosis, pyuria, urinary retention:  - Treat  empirically for UTI with CTX while awaiting culture.  - There is abnormality on CT chest, ground glass attenuation w/septal thickening, though pt has not focal symptoms of pneumonia, monitor clinically   T2DM:  -CBGs better controlled now - Very sensitive SSI given his malnutrition and renal impairment.  - Continue formulary -gliptin, hold [facility]    Moderate protein calorie malnutrition: Body mass index is 18.54 kg/m. Suspect significant catabolic state given enlarging mass with central necrosis.  - Supplement protein as much as possible.    Bilateral pleural effusions:  - Diuresis as above   PAF/flutter: Currently in NSR. Last TSH 4.9. - Continue eliquis - Continue diltiazem, amiodarone   CAD, aortic atherosclerosis: No anginal complaints - Continue statin, anticoagulation for now.    Depression: Quiescent - Continue SSRI  Goals of care discussions Overall prognosis is felt to be poor in the setting of CHF, CKD4-5, severe malnutrition, large retroperitoneal paraganglioma, ongoing failure to thrive.  Discussed poor prognosis with the patient again 6/7 and 6/8 -Seen by palliative care 6/6, 6/7: Poor prognosis discussed ,  -called wife again, she said that spouse and family is making decisions on his behalf, I recommended DNR, she was agreeable today    DVT prophylaxis: Apixaban Code Status: DNR, called and d/w wife Family Communication: Discussed with patient in detail using interpreter, no family at bedside, called and updated wife  Disposition Plan: To be determined, continues to decline  Consultants: Nephrology, cardiology   Procedures:   Antimicrobials:    Objective: Vitals:   12/16/21 1121 12/16/21 1933 12/17/21 0306 12/17/21 1115  BP: 133/63 (!) 126/53 (!) 142/51 (!) 151/69  Pulse:  62 64 88  Resp:  '17 19 19  '$ Temp: 97.8 F (36.6 C) 98 F (36.7 C) 98.3 F (36.8 C) 97.7 F (36.5 C)  TempSrc: Oral Oral Oral Oral  SpO2:  96% 98% 93%  Weight:   51.7 kg   Height:         Intake/Output Summary (Last 24 hours) at 12/17/2021 1347 Last data filed at 12/17/2021 1118 Gross per 24 hour  Intake 600 ml  Output 1850 ml  Net -1250 ml   Filed Weights   12/15/21 0530 12/16/21 0525 12/17/21 0306  Weight: 52.1 kg 53 kg 51.7 kg    Examination:  General exam: Elderly chronically ill cachectic male sitting up in bed, AAOx3, no distress HEENT: Positive JVD CVS: S1-S2, regular rhythm, systolic murmur Lungs: Few basilar rales, otherwise clear Abdomen: Soft, nontender, bowel sounds present Extremities: Trace edema  Skin: No rashes Psychiatry:  Mood & affect appropriate.     Data Reviewed:   CBC: Recent Labs  Lab 12/13/21 1011 12/16/21 0506 12/17/21 0402  WBC 12.4* 8.6 10.8*  NEUTROABS 11.3*  --   --   HGB 9.6* 7.7* 7.7*  HCT 29.5* 23.9* 23.5*  MCV 84.5 85.4 86.4  PLT 386 298 510   Basic Metabolic Panel: Recent Labs  Lab 12/13/21 1011 12/14/21 0347 12/15/21 0426 12/16/21 0506 12/17/21 0402  NA 137 138 133* 131* 132*  K 3.7 3.7 4.2 3.8 4.4  CL 100 104 99 92* 96*  CO2 21* '22 22 22 22  '$ GLUCOSE 290* 169* 88 117* 153*  BUN 106* 104* 99* 107* 108*  CREATININE 4.75* 4.49* 4.60* 4.97* 5.02*  CALCIUM 8.6* 8.5* 8.3* 8.4* 8.4*  MG 2.1  --   --   --   --  PHOS  --   --   --   --  8.0*   GFR: Estimated Creatinine Clearance: 9.4 mL/min (A) (by C-G formula based on SCr of 5.02 mg/dL (H)). Liver Function Tests: Recent Labs  Lab 12/17/21 0402  ALBUMIN 2.8*   No results for input(s): "LIPASE", "AMYLASE" in the last 168 hours. No results for input(s): "AMMONIA" in the last 168 hours. Coagulation Profile: No results for input(s): "INR", "PROTIME" in the last 168 hours. Cardiac Enzymes: Recent Labs  Lab 12/13/21 1914  CKTOTAL 93   BNP (last 3 results) Recent Labs    12/10/21 1339  PROBNP 12,645*   HbA1C: No results for input(s): "HGBA1C" in the last 72 hours. CBG: Recent Labs  Lab 12/16/21 1119 12/16/21 1600 12/16/21 2105  12/17/21 0606 12/17/21 1115  GLUCAP 223* 128* 376* 176* 250*   Lipid Profile: No results for input(s): "CHOL", "HDL", "LDLCALC", "TRIG", "CHOLHDL", "LDLDIRECT" in the last 72 hours. Thyroid Function Tests: No results for input(s): "TSH", "T4TOTAL", "FREET4", "T3FREE", "THYROIDAB" in the last 72 hours. Anemia Panel: No results for input(s): "VITAMINB12", "FOLATE", "FERRITIN", "TIBC", "IRON", "RETICCTPCT" in the last 72 hours. Urine analysis:    Component Value Date/Time   COLORURINE STRAW (A) 12/17/2021 1052   APPEARANCEUR CLEAR 12/17/2021 1052   LABSPEC 1.009 12/17/2021 1052   PHURINE 5.0 12/17/2021 1052   GLUCOSEU 50 (A) 12/17/2021 1052   HGBUR MODERATE (A) 12/17/2021 1052   BILIRUBINUR NEGATIVE 12/17/2021 1052   BILIRUBINUR negative 03/02/2021 1458   BILIRUBINUR neg 03/23/2015 1404   KETONESUR NEGATIVE 12/17/2021 1052   PROTEINUR 30 (A) 12/17/2021 1052   UROBILINOGEN 0.2 03/02/2021 1458   NITRITE NEGATIVE 12/17/2021 1052   LEUKOCYTESUR TRACE (A) 12/17/2021 1052   Sepsis Labs: '@LABRCNTIP'$ (procalcitonin:4,lacticidven:4)  ) Recent Results (from the past 240 hour(s))  Urine Culture     Status: None   Collection Time: 12/14/21  6:41 AM   Specimen: In/Out Cath Urine  Result Value Ref Range Status   Specimen Description IN/OUT CATH URINE  Final   Special Requests NONE  Final   Culture   Final    NO GROWTH Performed at [facility], [place] [address] [place], [place] [address]    Report Status 12/15/2021 FINAL  Final     Radiology Studies: No results found.   Scheduled Meds:  amiodarone  200 mg Oral Daily   apixaban  2.5 mg Oral BID   atorvastatin  80 mg Oral Daily   calcium acetate  667 mg Oral TID with meals   Chlorhexidine Gluconate Cloth  6 each Topical Daily   citalopram  20 mg Oral Daily   diltiazem  180 mg Oral Daily   doxazosin  4 mg Oral Daily   finasteride  5 mg Oral Daily   insulin aspart  0-6 Units Subcutaneous TID WC   linagliptin  5 mg Oral  Daily   metolazone  5 mg Oral Daily   sodium bicarbonate  650 mg Oral BID   sodium chloride flush  3 mL Intravenous Q12H   Continuous Infusions:  sodium chloride       LOS: 4 days    Time spent: 7mn   PDomenic Polite MD [facility]   12/17/2021, 1:47 PM

## 2021-12-17 NOTE — Evaluation (Signed)
Physical Therapy Evaluation Patient Details Name: Adam Reid MRN: [id number] DOB: 08-Sep-1945 Today's Date: 12/17/2021  History of Present Illness  The pt is a 76 yo male presenting 6/4 with SOB and hypoxia. Work up revealed elevated BNP, acute CHF exacerbation, and worsening renal function with retroperitoneal mass. PMH includes: CKD IV, CHF, DM II, hypoxic respiratory failure on 2-2.5L, HTN, PAF on Eliquis, and malnutrition.   Clinical Impression  Pt in bed upon arrival of PT, agreeable to evaluation at this time. Prior to admission the pt was independent with all mobility at home without need for DME. Pt reports no falls in last 6 months, driving and independent with all ADLs as well. The pt presents to day with mild deficits in dynamic stability and activity tolerance, reaching for single UE support initially with static stance and gait, and needing intermittent assist to steady with hallway ambulation. Will continue to benefit from skilled PT acutely to progress dynamic stability and endurance, suspect pt will progress well and be safe to return home with family assist when medically stable for d/c.         Recommendations for follow up therapy are one component of a multi-disciplinary discharge planning process, led by the attending physician.  Recommendations may be updated based on patient status, additional functional criteria and insurance authorization.  Follow Up Recommendations No PT follow up    Assistance Recommended at Discharge PRN  Patient can return home with the following  A little help with bathing/dressing/bathroom;Assistance with cooking/housework;Assist for transportation    Equipment Recommendations None recommended by PT  Recommendations for Other Services       Functional Status Assessment Patient has had a recent decline in their functional status and demonstrates the ability to make significant improvements in function in a reasonable and predictable amount  of time.     Precautions / Restrictions Precautions Precautions: Fall Precaution Comments: watch O2, on 2-3L this session Restrictions Weight Bearing Restrictions: No      Mobility  Bed Mobility Overal bed mobility: Independent             General bed mobility comments: No use of the rails, mildly increased time    Transfers Overall transfer level: Needs assistance Equipment used: None Transfers: Sit to/from Stand Sit to Stand: Min guard           General transfer comment: minG to supervision for safety within session, mild instability on initial stand    Ambulation/Gait Ambulation/Gait assistance: Min guard Gait Distance (Feet): 75 Feet Assistive device: None Gait Pattern/deviations: Step-through pattern, Decreased stride length Gait velocity: decreased     General Gait Details: slow with mild instability, staggering steps or sway but pt self-corrects without UE support. slowed     Balance Overall balance assessment: Needs assistance Sitting-balance support: Feet supported, No upper extremity supported Sitting balance-Leahy Scale: Normal     Standing balance support: No upper extremity supported, During functional activity Standing balance-Leahy Scale: Fair Standing balance comment: mild instability, no UE support                             Pertinent Vitals/Pain Pain Assessment Pain Assessment: No/denies pain    Home Living Family/patient expects to be discharged to:: Private residence Living Arrangements: Spouse/significant other Available Help at Discharge: Family;Available 24 hours/day Type of Home: House Home Access: Level entry       Home Layout: One level Home Equipment: Conservation officer, nature (2 wheels);Cane - single  point;BSC/3in1;Shower seat      Prior Function Prior Level of Function : Independent/Modified Independent;Driving             Mobility Comments: pt reports no use of DME, no falls in last 6 months ADLs  Comments: independent with ADLs     Hand Dominance   Dominant Hand: Right    Extremity/Trunk Assessment   Upper Extremity Assessment Upper Extremity Assessment: Overall WFL for tasks assessed    Lower Extremity Assessment Lower Extremity Assessment: Overall WFL for tasks assessed    Cervical / Trunk Assessment Cervical / Trunk Assessment: Normal  Communication   Communication: Prefers language other than English (Farsi interpreter, Scientific laboratory technician [phone])  Cognition Arousal/Alertness: Awake/alert Behavior During Therapy: WFL for tasks assessed/performed Overall Cognitive Status: Within Functional Limits for tasks assessed                                          General Comments General comments (skin integrity, edema, etc.): SpO2 to low of 84% on RA at rest, low of 87% on2L, used 3 for final 21f ambulation        Assessment/Plan    PT Assessment Patient needs continued PT services  PT Problem List Decreased strength;Decreased activity tolerance;Cardiopulmonary status limiting activity       PT Treatment Interventions DME instruction;Gait training;Functional mobility training;Therapeutic activities;Therapeutic exercise;Balance training;Neuromuscular re-education;Patient/family education    PT Goals (Current goals can be found in the Care Plan section)  Acute Rehab PT Goals Patient Stated Goal: to go home again soon PT Goal Formulation: With patient Time For Goal Achievement: 12/31/21 Potential to Achieve Goals: Good    Frequency Min 3X/week        AM-PAC PT "6 Clicks" Mobility  Outcome Measure Help needed turning from your back to your side while in a flat bed without using bedrails?: None Help needed moving from lying on your back to sitting on the side of a flat bed without using bedrails?: None Help needed moving to and from a bed to a chair (including a wheelchair)?: A Little Help needed standing up from a chair using your arms (e.g., wheelchair  or bedside chair)?: A Little Help needed to walk in hospital room?: A Little Help needed climbing 3-5 steps with a railing? : A Little 6 Click Score: 20    End of Session Equipment Utilized During Treatment: Gait belt Activity Tolerance: Patient tolerated treatment well Patient left: in bed;with call bell/phone within reach Nurse Communication: Mobility status PT Visit Diagnosis: Muscle weakness (generalized) (M62.81)    Time: 14580-9983PT Time Calculation (min) (ACUTE ONLY): 20 min   Charges:   PT Evaluation $PT Eval Low Complexity: 1 Low          KWest Carbo PT, DPT   Acute Rehabilitation Department  KSandra Cockayne6/02/2022, 4:01 PM

## 2021-12-17 NOTE — IPAL (Signed)
  Interdisciplinary Goals of Care Family Meeting   Date carried out: 12/17/2021  Location of the meeting: Phone conference  Member's involved: Physician and Family Member or next of kin  Durable Power of Attorney or acting medical decision maker: [name]  Discussion: We discussed goals of care for Adam Reid .  I called and discussed poor prognosis with patient's spouse Adam Reid who is his primary decision maker and next of kin, recommended consideration of DNR, she was agreeable to this  Code status: Full DNR  Disposition: Continue current acute care  Time spent for the meeting: 38mn    PDomenic Polite MD  12/17/2021, 2:45 PM

## 2021-12-18 DIAGNOSIS — I130 Hypertensive heart and chronic kidney disease with heart failure and stage 1 through stage 4 chronic kidney disease, or unspecified chronic kidney disease: Secondary | ICD-10-CM | POA: Diagnosis not present

## 2021-12-18 LAB — GLUCOSE, CAPILLARY
Glucose-Capillary: 165 mg/dL — ABNORMAL HIGH (ref 70–99)
Glucose-Capillary: 197 mg/dL — ABNORMAL HIGH (ref 70–99)
Glucose-Capillary: 302 mg/dL — ABNORMAL HIGH (ref 70–99)
Glucose-Capillary: 222 mg/dL — ABNORMAL HIGH (ref 70–99)

## 2021-12-18 LAB — CBC
HCT: 23.5 % — ABNORMAL LOW (ref 39.0–52.0)
Hemoglobin: 7.8 g/dL — ABNORMAL LOW (ref 13.0–17.0)
MCH: 28.6 pg (ref 26.0–34.0)
MCHC: 33.2 g/dL (ref 30.0–36.0)
MCV: 86.1 fL (ref 80.0–100.0)
Platelets: 264 10*3/uL (ref 150–400)
RBC: 2.73 MIL/uL — ABNORMAL LOW (ref 4.22–5.81)
RDW: 18.3 % — ABNORMAL HIGH (ref 11.5–15.5)
WBC: 9.7 10*3/uL (ref 4.0–10.5)
nRBC: 0 % (ref 0.0–0.2)

## 2021-12-18 LAB — RENAL FUNCTION PANEL
Albumin: 2.9 g/dL — ABNORMAL LOW (ref 3.5–5.0)
Anion gap: 12 (ref 5–15)
BUN: 106 mg/dL — ABNORMAL HIGH (ref 8–23)
CO2: 25 mmol/L (ref 22–32)
Calcium: 8.3 mg/dL — ABNORMAL LOW (ref 8.9–10.3)
Chloride: 95 mmol/L — ABNORMAL LOW (ref 98–111)
Creatinine, Ser: 5.04 mg/dL — ABNORMAL HIGH (ref 0.61–1.24)
GFR, Estimated: 11 mL/min — ABNORMAL LOW (ref >60)
Glucose, Bld: 112 mg/dL — ABNORMAL HIGH (ref 70–99)
Phosphorus: 7.5 mg/dL — ABNORMAL HIGH (ref 2.5–4.6)
Potassium: 3.8 mmol/L (ref 3.5–5.1)
Sodium: 132 mmol/L — ABNORMAL LOW (ref 135–145)

## 2021-12-18 NOTE — Plan of Care (Signed)
  Problem: Activity: Goal: Capacity to carry out activities will improve Outcome: Progressing   Problem: Education: Goal: Knowledge of General Education information will improve Description: Including pain rating scale, medication(s)/side effects and non-pharmacologic comfort measures Outcome: Progressing   Problem: Activity: Goal: Risk for activity intolerance will decrease Outcome: Progressing   Problem: Elimination: Goal: Will not experience complications related to urinary retention Outcome: Progressing

## 2021-12-18 NOTE — Progress Notes (Signed)
PROGRESS NOTE    Adam Reid  DPO:[id number] DOB: 03-04-1946 DOA: 12/13/2021 PCP: [name], MD  76 y.o. 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 CT demonstrated enlargement of retroperitoneal mass. The patient was found to be retaining urine, so foley catheter was placed and he was admitted for AKI on stage IV CKD with nephrology, cardiology, and palliative care consulted. Renal function is stable with diuresis. Patient requested discontinuation of foley but has persistently showed evidence of retention, so coude catheter was placed 6/6.  -Seen by oncology, recommended evaluation by surgical oncology -Overall prognosis felt to be poor, s/p Palliative care meeting with patient and family 6/6 with determination that the patient would like no restrictions to medical care,  hope to pursue measures with curative intent.  -Dr.Grunz discussed in detail with endocrinology consultant and surgical oncology at [facility] on 6/6 who recommended the work up as ordered and follow up after discharge. Did not recommend inpatient transfer for management of paraganglioma.    Subjective: -Feels okay today, denies much dyspnea, has some anorexia and intermittent nausea, no vomiting reported  Assessment and Plan:  Acute on chronic HFpEF: Echo shows LVEF 55-60%, mild basal-septal LVH and grade III diastolic dysfunction, elevated PASP and RV enlargement, IVC dilated. Moderate MR. -Diuresed with IV Lasix initially, diuretics on hold for few days, creatinine up to 5 now, BUN is 108 with some uremic symptoms -He is 3.6 L negative -GDMT limited by CKD -Cardiology signed off, not much further to offer   AKI on stage IV CKD:  -Secondary to cardiorenal syndrome and urinary retention -Now  back with a coud catheter for recurrent retention -Nephrology following, diuretics on hold, BUN and creatinine with mild worsening now stable, has some marginal uremic symptoms as well, urine output is still fair, anticipate need for dialysis in the near future, palliative care following as well   Acute urinary retention: BPH noted on prior imaging - Patient requested discontinuation of foley due to pain despite lidocaine and oral medications.  Recurrent urinary retention this admission, replaced with a coud catheter 6/6  -Now started on Cardura in the setting of paraganglioma as well   Retroperitoneal paraganglioma: Enlarging. 6.7 x 5.4 cm less than a month ago, now 6.9 x 6.1 x 7.9 cm. Location abuts the infrarenal aorta.  - D/w oncology who recommended endocrinology and surgical evaluation. Surgery locally was contacted and deferred to tertiary care centers.  Dr. Bonner Puna discussed with Dr. Cam Hai who is an endocrine surgeon at [place]. He recommended starting doxazosin and continuing this until follow up. He will have his office reach out to patient's wife to schedule follow up next week. Of note, the enlargement seen on repeat CT may be related to bleeding from biopsy and not true tumor proliferation. Dr.Randle specifically stated he would not recommend any surgical intervention at this time, so transfer for this reason would be futile.  -Dr. Bonner Puna also discussed with endocrinology, Dr. Buddy Duty, who confirmed work up with urine metanephrines, urine catecholamines, and plasma metanephrines is the appropriate next step in hopes of determining whether this is functioning vs. not hormonally actively functioning tumor. No further recommendations acutely, but will be happy to follow up with the patient after discharge.    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  - No ESA with malignancy. Of note, had an appointment scheduled as  outpatient for feraheme and retacrit on 6/7. -Given IV iron 6/6, 6/7 -Relatively stable   Leukocytosis, pyuria, urinary retention:  - Treat empirically for UTI with CTX while awaiting culture, off antibiotics now, cultures are negative.  - There is abnormality on CT chest, ground glass attenuation w/septal thickening, though pt has not focal symptoms of pneumonia, monitor clinically-likely chronic, noted on prior admission as well   T2DM:  -CBGs now stable - Very sensitive SSI given his malnutrition and renal impairment.  - Continue formulary -gliptin, hold [facility]    Moderate protein calorie malnutrition: Body mass index is 18.54 kg/m. Suspect significant catabolic state given enlarging mass with central necrosis.  - Supplement protein as much as possible.    PAF/flutter: Currently in NSR. Last TSH 4.9. - Continue eliquis - Continue diltiazem, amiodarone   CAD, aortic atherosclerosis: - Continue statin, anticoagulation for now.    Depression: Quiescent - Continue SSRI  Goals of care discussions Overall prognosis is felt to be poor in the setting of CHF, CKD4-5, severe malnutrition, large retroperitoneal paraganglioma, ongoing failure to thrive.  Discussed poor prognosis with the patient again 6/7 and 6/8 -Seen by palliative care 6/6, 6/7: Poor prognosis discussed ,  -I called and discussed with wife 6/8, she said that spouse and family is making decisions on his behalf, I recommended DNR, she was agreeable, CODE STATUS changed    DVT prophylaxis: Apixaban Code Status: DNR, called and d/w wife Family Communication: Discussed with patient in detail using interpreter, no family at bedside, called and updated wife 6/8 Disposition Plan: To be determined, continues to decline, prognosis is poor  Consultants: Nephrology, cardiology   Procedures:   Antimicrobials:    Objective: Vitals:   12/18/21 0300 12/18/21 0357 12/18/21 0806 12/18/21 1039  BP:  124/70 (!) 127/55 137/72   Pulse:  81 80 88  Resp:  (!) '24 17 16  '$ Temp:  97.6 F (36.4 C) 97.6 F (36.4 C) 97.8 F (36.6 C)  TempSrc:  Oral Oral Oral  SpO2:  97% 98% 95%  Weight: 52.4 kg     Height:        Intake/Output Summary (Last 24 hours) at 12/18/2021 1229 Last data filed at 12/18/2021 1039 Gross per 24 hour  Intake 480 ml  Output 1750 ml  Net -1270 ml   Filed Weights   12/16/21 0525 12/17/21 0306 12/18/21 0300  Weight: 53 kg 51.7 kg 52.4 kg    Examination:  General exam: Elderly frail chronically ill male sitting up in bed, AAOx3, no distress HEENT: Positive JVD CVS: S1-S2, regular rhythm, systolic murmur Lungs: Few basilar rales Abdomen: Soft, nontender, bowel sounds present Extremities: Trace edema  Skin: No rashes Psychiatry:  Mood & affect appropriate.     Data Reviewed:   CBC: Recent Labs  Lab 12/13/21 1011 12/16/21 0506 12/17/21 0402 12/18/21 0349  WBC 12.4* 8.6 10.8* 9.7  NEUTROABS 11.3*  --   --   --   HGB 9.6* 7.7* 7.7* 7.8*  HCT 29.5* 23.9* 23.5* 23.5*  MCV 84.5 85.4 86.4 86.1  PLT 386 298 276 947   Basic Metabolic Panel: Recent Labs  Lab 12/13/21 1011 12/14/21 0347 12/15/21 0426 12/16/21 0506 12/17/21 0402 12/18/21 0349  NA 137 138 133* 131* 132* 132*  K 3.7 3.7 4.2 3.8 4.4 3.8  CL 100 104 99 92* 96* 95*  CO2 21* '22 22 22 22 25  '$ GLUCOSE 290* 169* 88 117* 153* 112*  BUN 106* 104* 99* 107* 108* 106*  CREATININE 4.75* 4.49* 4.60* 4.97* 5.02* 5.04*  CALCIUM 8.6* 8.5* 8.3* 8.4* 8.4* 8.3*  MG 2.1  --   --   --   --   --   PHOS  --   --   --   --  8.0* 7.5*   GFR: Estimated Creatinine Clearance: 9.5 mL/min (A) (by C-G formula based on SCr of 5.04 mg/dL (H)). Liver Function Tests: Recent Labs  Lab 12/17/21 0402 12/18/21 0349  ALBUMIN 2.8* 2.9*   No results for input(s): "LIPASE", "AMYLASE" in the last 168 hours. No results for input(s): "AMMONIA" in the last 168 hours. Coagulation Profile: No results for input(s): "INR", "PROTIME" in the last 168  hours. Cardiac Enzymes: Recent Labs  Lab 12/13/21 1914  CKTOTAL 93   BNP (last 3 results) Recent Labs    12/10/21 1339  PROBNP 12,645*   HbA1C: No results for input(s): "HGBA1C" in the last 72 hours. CBG: Recent Labs  Lab 12/17/21 1115 12/17/21 1656 12/17/21 2059 12/18/21 0605 12/18/21 1037  GLUCAP 250* 123* 286* 165* 197*   Lipid Profile: No results for input(s): "CHOL", "HDL", "LDLCALC", "TRIG", "CHOLHDL", "LDLDIRECT" in the last 72 hours. Thyroid Function Tests: No results for input(s): "TSH", "T4TOTAL", "FREET4", "T3FREE", "THYROIDAB" in the last 72 hours. Anemia Panel: No results for input(s): "VITAMINB12", "FOLATE", "FERRITIN", "TIBC", "IRON", "RETICCTPCT" in the last 72 hours. Urine analysis:    Component Value Date/Time   COLORURINE STRAW (A) 12/17/2021 1052   APPEARANCEUR CLEAR 12/17/2021 1052   LABSPEC 1.009 12/17/2021 1052   PHURINE 5.0 12/17/2021 1052   GLUCOSEU 50 (A) 12/17/2021 1052   HGBUR MODERATE (A) 12/17/2021 1052   BILIRUBINUR NEGATIVE 12/17/2021 1052   BILIRUBINUR negative 03/02/2021 1458   BILIRUBINUR neg 03/23/2015 1404   KETONESUR NEGATIVE 12/17/2021 1052   PROTEINUR 30 (A) 12/17/2021 1052   UROBILINOGEN 0.2 03/02/2021 1458   NITRITE NEGATIVE 12/17/2021 1052   LEUKOCYTESUR TRACE (A) 12/17/2021 1052   Sepsis Labs: '@LABRCNTIP'$ (procalcitonin:4,lacticidven:4)  ) Recent Results (from the past 240 hour(s))  Urine Culture     Status: None   Collection Time: 12/14/21  6:41 AM   Specimen: In/Out Cath Urine  Result Value Ref Range Status   Specimen Description IN/OUT CATH URINE  Final   Special Requests NONE  Final   Culture   Final    NO GROWTH Performed at [facility], [place] [address], [place], [place] [address]    Report Status 12/15/2021 FINAL  Final     Radiology Studies: No results found.   Scheduled Meds:  amiodarone  200 mg Oral Daily   apixaban  2.5 mg Oral BID   atorvastatin  80 mg Oral Daily   calcium acetate   667 mg Oral TID with meals   Chlorhexidine Gluconate Cloth  6 each Topical Daily   citalopram  20 mg Oral Daily   diltiazem  180 mg Oral Daily   doxazosin  4 mg Oral Daily   finasteride  5 mg Oral Daily   insulin aspart  0-6 Units Subcutaneous TID WC   linagliptin  5 mg Oral Daily   metolazone  5 mg Oral Daily   sodium bicarbonate  650 mg Oral BID   sodium chloride flush  3 mL Intravenous Q12H   Continuous Infusions:  sodium chloride       LOS: 5 days  Time spent: 73mn   PDomenic Polite MD [facility]   12/18/2021, 12:29 PM

## 2021-12-18 NOTE — Progress Notes (Signed)
Patient ID: Paiden Cavell, male   DOB: [birth year], 76 y.o.   MRN: [id number] S: No complaints. O:BP 137/72   Pulse 88   Temp 97.8 F (36.6 C) (Oral)   Resp 16   Ht '5\' 6"'$  (1.676 m)   Wt 52.4 kg   SpO2 95%   BMI 18.65 kg/m   Intake/Output Summary (Last 24 hours) at 12/18/2021 1137 Last data filed at 12/18/2021 1039 Gross per 24 hour  Intake 480 ml  Output 1750 ml  Net -1270 ml   Intake/Output: I/O last 3 completed shifts: In: 720 [P.O.:720] Out: 1950 [Urine:1950]  Intake/Output this shift:  Total I/O In: 360 [P.O.:360] Out: 750 [Urine:750] Weight change: 0.7 kg Gen:NAD CVS:RRR Resp:CTA Abd: +BS, soft, NT/ND Ext: no edema  Recent Labs  Lab 12/13/21 1011 12/14/21 0347 12/15/21 0426 12/16/21 0506 12/17/21 0402 12/18/21 0349  NA 137 138 133* 131* 132* 132*  K 3.7 3.7 4.2 3.8 4.4 3.8  CL 100 104 99 92* 96* 95*  CO2 21* '22 22 22 22 25  '$ GLUCOSE 290* 169* 88 117* 153* 112*  BUN 106* 104* 99* 107* 108* 106*  CREATININE 4.75* 4.49* 4.60* 4.97* 5.02* 5.04*  ALBUMIN  --   --   --   --  2.8* 2.9*  CALCIUM 8.6* 8.5* 8.3* 8.4* 8.4* 8.3*  PHOS  --   --   --   --  8.0* 7.5*   Liver Function Tests: Recent Labs  Lab 12/17/21 0402 12/18/21 0349  ALBUMIN 2.8* 2.9*   No results for input(s): "LIPASE", "AMYLASE" in the last 168 hours. No results for input(s): "AMMONIA" in the last 168 hours. CBC: Recent Labs  Lab 12/13/21 1011 12/16/21 0506 12/17/21 0402 12/18/21 0349  WBC 12.4* 8.6 10.8* 9.7  NEUTROABS 11.3*  --   --   --   HGB 9.6* 7.7* 7.7* 7.8*  HCT 29.5* 23.9* 23.5* 23.5*  MCV 84.5 85.4 86.4 86.1  PLT 386 298 276 264   Cardiac Enzymes: Recent Labs  Lab 12/13/21 1914  CKTOTAL 93   CBG: Recent Labs  Lab 12/17/21 1115 12/17/21 1656 12/17/21 2059 12/18/21 0605 12/18/21 1037  GLUCAP 250* 123* 286* 165* 197*    Iron Studies: No results for input(s): "IRON", "TIBC", "TRANSFERRIN", "FERRITIN" in the last 72 hours. Studies/Results: No results found.   amiodarone  200 mg Oral Daily   apixaban  2.5 mg Oral BID   atorvastatin  80 mg Oral Daily   calcium acetate  667 mg Oral TID with meals   Chlorhexidine Gluconate Cloth  6 each Topical Daily   citalopram  20 mg Oral Daily   diltiazem  180 mg Oral Daily   doxazosin  4 mg Oral Daily   finasteride  5 mg Oral Daily   insulin aspart  0-6 Units Subcutaneous TID WC   linagliptin  5 mg Oral Daily   metolazone  5 mg Oral Daily   sodium bicarbonate  650 mg Oral BID   sodium chloride flush  3 mL Intravenous Q12H    BMET    Component Value Date/Time   NA 132 (L) 12/18/2021 0349   NA 137 06/19/2020 0936   K 3.8 12/18/2021 0349   CL 95 (L) 12/18/2021 0349   CO2 25 12/18/2021 0349   GLUCOSE 112 (H) 12/18/2021 0349   BUN 106 (H) 12/18/2021 0349   BUN 51 (H) 06/19/2020 0936   CREATININE 5.04 (H) 12/18/2021 0349   CREATININE 2.68 (H) 03/26/2016 1839   CALCIUM 8.3 (  L) 12/18/2021 0349   GFRNONAA 11 (L) 12/18/2021 0349   GFRAA 21 (L) 06/19/2020 0936   CBC    Component Value Date/Time   WBC 9.7 12/18/2021 0349   RBC 2.73 (L) 12/18/2021 0349   HGB 7.8 (L) 12/18/2021 0349   HGB 9.1 (L) 09/14/2019 1555   HCT 23.5 (L) 12/18/2021 0349   HCT 28.4 (L) 09/14/2019 1555   PLT 264 12/18/2021 0349   PLT 464 (H) 09/14/2019 1555   MCV 86.1 12/18/2021 0349   MCV 82 09/14/2019 1555   MCH 28.6 12/18/2021 0349   MCHC 33.2 12/18/2021 0349   RDW 18.3 (H) 12/18/2021 0349   RDW 16.4 (H) 09/14/2019 1555   LYMPHSABS 0.4 (L) 12/13/2021 1011   LYMPHSABS 0.6 (L) 10/05/2017 1433   MONOABS 0.5 12/13/2021 1011   EOSABS 0.1 12/13/2021 1011   EOSABS 0.1 10/05/2017 1433   BASOSABS 0.1 12/13/2021 1011   BASOSABS 0.0 10/05/2017 1433    Assessment/Plan:  AKI/CKD stage IV - presumably combination of urinary retention and cardiorenal syndrome in the setting of acute on chronic CHF.  BUN/Cr have reached a plateau.  Good UOP after foley catheter and stopping IV lasix.  No indication for hemodialysis, however we may  need to discuss the possible need for HD if his renal function continues to worsen.  He normally follows with [facility] [name] at [facility] and will need hospital follow up after discharge.  Avoid nephrotoxic agents such as IV contrast, NSAIDs, phosphorous containing bowel preps.  Renal dose meds. Continue to follow UOP and daily Scr.  Stopped lasix 80 mg IV daily due to rise in Scr on 12/16/21 but he remains negative with I&O's.  May benefit from gentle IVF's as he is not taking in much by mouth. Acute on chronic diastolic CHF - improved with IV lasix 80 mg bid.  Palliative care following for worsening cardiac function and FTT.  He is now DNR P. 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  Surgery recommends transfer to tertiary care center given high risk.  Declined for transfer but recommended to start alpha blocking agent.  Will need outpatient follow up. Urinary retention - foley replaced.  Predated hospitalization.  DM type 2 - per primary HTN - stable Anemia of CKD stage IV - follow H/H.  Hold off on ESA given recent diagnosis of paraganglioma.  Will defer management to Oncology.  Donetta Potts, MD [facility]

## 2021-12-19 DIAGNOSIS — N179 Acute kidney failure, unspecified: Secondary | ICD-10-CM | POA: Diagnosis not present

## 2021-12-19 DIAGNOSIS — I509 Heart failure, unspecified: Secondary | ICD-10-CM | POA: Diagnosis not present

## 2021-12-19 LAB — METANEPHRINES, PLASMA
Metanephrine, Free: 620.6 pg/mL — ABNORMAL HIGH (ref 0.0–88.0)
Normetanephrine, Free: 9661.5 pg/mL — ABNORMAL HIGH (ref 0.0–285.2)

## 2021-12-19 LAB — GLUCOSE, CAPILLARY
Glucose-Capillary: 165 mg/dL — ABNORMAL HIGH (ref 70–99)
Glucose-Capillary: 116 mg/dL — ABNORMAL HIGH (ref 70–99)
Glucose-Capillary: 155 mg/dL — ABNORMAL HIGH (ref 70–99)
Glucose-Capillary: 157 mg/dL — ABNORMAL HIGH (ref 70–99)

## 2021-12-19 LAB — URINALYSIS, ROUTINE W REFLEX MICROSCOPIC
Bacteria, UA: NONE SEEN
Bilirubin Urine: NEGATIVE
Glucose, UA: NEGATIVE mg/dL
Ketones, ur: NEGATIVE mg/dL
Nitrite: NEGATIVE
Protein, ur: 100 mg/dL — AB
RBC / HPF: >50 RBC/hpf — ABNORMAL HIGH (ref 0–5)
Specific Gravity, Urine: 1.011 (ref 1.005–1.030)
pH: 6 (ref 5.0–8.0)

## 2021-12-19 LAB — RENAL FUNCTION PANEL
Albumin: 3 g/dL — ABNORMAL LOW (ref 3.5–5.0)
Anion gap: 14 (ref 5–15)
BUN: 103 mg/dL — ABNORMAL HIGH (ref 8–23)
CO2: 25 mmol/L (ref 22–32)
Calcium: 8.5 mg/dL — ABNORMAL LOW (ref 8.9–10.3)
Chloride: 92 mmol/L — ABNORMAL LOW (ref 98–111)
Creatinine, Ser: 5.03 mg/dL — ABNORMAL HIGH (ref 0.61–1.24)
GFR, Estimated: 11 mL/min — ABNORMAL LOW (ref >60)
Glucose, Bld: 139 mg/dL — ABNORMAL HIGH (ref 70–99)
Phosphorus: 7 mg/dL — ABNORMAL HIGH (ref 2.5–4.6)
Potassium: 4.4 mmol/L (ref 3.5–5.1)
Sodium: 131 mmol/L — ABNORMAL LOW (ref 135–145)

## 2021-12-19 LAB — CBC
HCT: 24.3 % — ABNORMAL LOW (ref 39.0–52.0)
Hemoglobin: 8 g/dL — ABNORMAL LOW (ref 13.0–17.0)
MCH: 28.4 pg (ref 26.0–34.0)
MCHC: 32.9 g/dL (ref 30.0–36.0)
MCV: 86.2 fL (ref 80.0–100.0)
Platelets: 272 10*3/uL (ref 150–400)
RBC: 2.82 MIL/uL — ABNORMAL LOW (ref 4.22–5.81)
RDW: 18.2 % — ABNORMAL HIGH (ref 11.5–15.5)
WBC: 12.2 10*3/uL — ABNORMAL HIGH (ref 4.0–10.5)
nRBC: 0 % (ref 0.0–0.2)

## 2021-12-19 LAB — SODIUM, URINE, RANDOM: Sodium, Ur: 56 mmol/L

## 2021-12-19 LAB — CREATININE, URINE, RANDOM: Creatinine, Urine: 35.05 mg/dL

## 2021-12-19 MED ORDER — POLYETHYLENE GLYCOL 3350 17 G PO PACK
17.0000 g | PACK | Freq: Every day | ORAL | Status: DC
  Administered 2021-12-19 – 2021-12-30 (×12): 17 g via ORAL
  Filled 2021-12-19 (×11): qty 1

## 2021-12-19 MED ORDER — HYDROMORPHONE HCL 1 MG/ML IJ SOLN: 0.5000 mg | Freq: Once | INTRAMUSCULAR | Status: DC

## 2021-12-19 MED ORDER — SODIUM CHLORIDE 0.9 % IV SOLN: INTRAVENOUS | Status: AC

## 2021-12-19 MED ORDER — LIDOCAINE HCL URETHRAL/MUCOSAL 2 % EX GEL
1.0000 "application " | Freq: Once | CUTANEOUS | Status: AC
  Administered 2021-12-19: 1 via URETHRAL
  Filled 2021-12-19: qty 6

## 2021-12-19 MED ORDER — SENNOSIDES-DOCUSATE SODIUM 8.6-50 MG PO TABS
1.0000 | ORAL_TABLET | Freq: Two times a day (BID) | ORAL | Status: DC
  Administered 2021-12-19 – 2021-12-31 (×25): 1 via ORAL
  Filled 2021-12-19 (×23): qty 1

## 2021-12-19 MED ORDER — LIDOCAINE HCL URETHRAL/MUCOSAL 2 % EX GEL
1.0000 | Freq: Once | CUTANEOUS | Status: AC
Start: 2021-12-19 — End: 2021-12-19
  Administered 2021-12-19: 1 via URETHRAL
  Filled 2021-12-19: qty 6

## 2021-12-19 MED ORDER — SODIUM CHLORIDE 0.9 % IV SOLN
1.0000 g | INTRAVENOUS | Status: DC
  Administered 2021-12-19: 1 g via INTRAVENOUS
  Filled 2021-12-19: qty 10

## 2021-12-19 NOTE — Progress Notes (Signed)
Patient was educated on the order for dilaudid iv order by Md for penial pain. Patent states he doesn't want the medication as he is not in pain he is just having burning at this time.Md updated.

## 2021-12-19 NOTE — Progress Notes (Signed)
Patient ID: Adam Reid, male   DOB: 1946/03/26, 76 y.o.   MRN: [id number] S: Complaining of pain at urinary catheter site. O:BP (!) 142/72 (BP Location: Left Arm)   Pulse 90   Temp 97.8 F (36.6 C) (Oral)   Resp 13   Ht '5\' 6"'$  (1.676 m)   Wt 51.7 kg   SpO2 97%   BMI 18.40 kg/m   Intake/Output Summary (Last 24 hours) at 12/19/2021 1124 Last data filed at 12/19/2021 0536 Gross per 24 hour  Intake 437 ml  Output 1050 ml  Net -613 ml   Intake/Output: I/O last 3 completed shifts: In: 299 [P.O.:917] Out: 2800 [Urine:2800]  Intake/Output this shift:  No intake/output data recorded. Weight change: -0.7 kg Gen:NAD CVS: RRR Resp:CTA Abd: +BS, soft, NT/ND Ext: no edema  Recent Labs  Lab 12/13/21 1011 12/14/21 0347 12/15/21 0426 12/16/21 0506 12/17/21 0402 12/18/21 0349 12/19/21 0229  NA 137 138 133* 131* 132* 132* 131*  K 3.7 3.7 4.2 3.8 4.4 3.8 4.4  CL 100 104 99 92* 96* 95* 92*  CO2 21* '22 22 22 22 25 25  '$ GLUCOSE 290* 169* 88 117* 153* 112* 139*  BUN 106* 104* 99* 107* 108* 106* 103*  CREATININE 4.75* 4.49* 4.60* 4.97* 5.02* 5.04* 5.03*  ALBUMIN  --   --   --   --  2.8* 2.9* 3.0*  CALCIUM 8.6* 8.5* 8.3* 8.4* 8.4* 8.3* 8.5*  PHOS  --   --   --   --  8.0* 7.5* 7.0*   Liver Function Tests: Recent Labs  Lab 12/17/21 0402 12/18/21 0349 12/19/21 0229  ALBUMIN 2.8* 2.9* 3.0*   No results for input(s): "LIPASE", "AMYLASE" in the last 168 hours. No results for input(s): "AMMONIA" in the last 168 hours. CBC: Recent Labs  Lab 12/13/21 1011 12/16/21 0506 12/17/21 0402 12/18/21 0349 12/19/21 0229  WBC 12.4* 8.6 10.8* 9.7 12.2*  NEUTROABS 11.3*  --   --   --   --   HGB 9.6* 7.7* 7.7* 7.8* 8.0*  HCT 29.5* 23.9* 23.5* 23.5* 24.3*  MCV 84.5 85.4 86.4 86.1 86.2  PLT 386 298 276 264 272   Cardiac Enzymes: Recent Labs  Lab 12/13/21 1914  CKTOTAL 93   CBG: Recent Labs  Lab 12/18/21 0605 12/18/21 1037 12/18/21 1638 12/18/21 2103 12/19/21 0610  GLUCAP 165*  197* 302* 222* 165*    Iron Studies: No results for input(s): "IRON", "TIBC", "TRANSFERRIN", "FERRITIN" in the last 72 hours. Studies/Results: No results found.  amiodarone  200 mg Oral Daily   apixaban  2.5 mg Oral BID   atorvastatin  80 mg Oral Daily   calcium acetate  667 mg Oral TID with meals   Chlorhexidine Gluconate Cloth  6 each Topical Daily   citalopram  20 mg Oral Daily   diltiazem  180 mg Oral Daily   doxazosin  4 mg Oral Daily   finasteride  5 mg Oral Daily    HYDROmorphone (DILAUDID) injection  0.5 mg Intravenous Once   insulin aspart  0-6 Units Subcutaneous TID WC   lidocaine  1 application  Urethral Once   linagliptin  5 mg Oral Daily   metolazone  5 mg Oral Daily   polyethylene glycol  17 g Oral Daily   senna-docusate  1 tablet Oral BID   sodium bicarbonate  650 mg Oral BID   sodium chloride flush  3 mL Intravenous Q12H    BMET    Component Value Date/Time  NA 131 (L) 12/19/2021 0229   NA 137 06/19/2020 0936   K 4.4 12/19/2021 0229   CL 92 (L) 12/19/2021 0229   CO2 25 12/19/2021 0229   GLUCOSE 139 (H) 12/19/2021 0229   BUN 103 (H) 12/19/2021 0229   BUN 51 (H) 06/19/2020 0936   CREATININE 5.03 (H) 12/19/2021 0229   CREATININE 2.68 (H) 03/26/2016 1839   CALCIUM 8.5 (L) 12/19/2021 0229   GFRNONAA 11 (L) 12/19/2021 0229   GFRAA 21 (L) 06/19/2020 0936   CBC    Component Value Date/Time   WBC 12.2 (H) 12/19/2021 0229   RBC 2.82 (L) 12/19/2021 0229   HGB 8.0 (L) 12/19/2021 0229   HGB 9.1 (L) 09/14/2019 1555   HCT 24.3 (L) 12/19/2021 0229   HCT 28.4 (L) 09/14/2019 1555   PLT 272 12/19/2021 0229   PLT 464 (H) 09/14/2019 1555   MCV 86.2 12/19/2021 0229   MCV 82 09/14/2019 1555   MCH 28.4 12/19/2021 0229   MCHC 32.9 12/19/2021 0229   RDW 18.2 (H) 12/19/2021 0229   RDW 16.4 (H) 09/14/2019 1555   LYMPHSABS 0.4 (L) 12/13/2021 1011   LYMPHSABS 0.6 (L) 10/05/2017 1433   MONOABS 0.5 12/13/2021 1011   EOSABS 0.1 12/13/2021 1011   EOSABS 0.1 10/05/2017  1433   BASOSABS 0.1 12/13/2021 1011   BASOSABS 0.0 10/05/2017 1433    Assessment/Plan:  AKI/CKD stage IV - presumably combination of urinary retention and cardiorenal syndrome in the setting of acute on chronic CHF.  BUN/Cr have reached a plateau.  Good UOP after foley catheter and stopping IV lasix.  No indication for hemodialysis, however we may need to discuss the possible need for HD if his renal function continues to worsen.  His BUN/CR have reached a plateau.  He is a marginal candidate for long-term dialysis given all of his co-morbidities.  He normally follows with Dr. [name] at [facility] and will need hospital follow up after discharge.  Avoid nephrotoxic agents such as IV contrast, NSAIDs, phosphorous containing bowel preps.  Renal dose meds. Continue to follow UOP and daily Scr.  Stopped lasix 80 mg IV daily due to rise in Scr on 12/16/21 but he remains negative with I&O's.  May benefit from gentle IVF's as he is not taking in much by mouth.  Will start IV NS 500 mL slowly and follow.  He is almost 6 liters negative since admission. Acute on chronic diastolic CHF - improved with IV lasix 80 mg bid.  Palliative care following for worsening cardiac function and FTT.  He is now DNR P. 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  Surgery recommends transfer to tertiary care center given high risk.  Declined for transfer but recommended to start alpha blocking agent.  Will need outpatient follow up. Urinary retention - foley replaced.  Predated hospitalization.  DM type 2 - per primary HTN - stable Anemia of CKD stage IV - follow H/H.  Hold off on ESA given recent diagnosis of paraganglioma.  Will defer management to Oncology.  Donetta Potts, MD [facility]

## 2021-12-19 NOTE — Progress Notes (Addendum)
Patient was moaning loudly in pain, ( interpreter used) patient stated he is having urethral pain and burning and would like some anaesthetic gel and pain killers, Md made aware.  See new orders.

## 2021-12-19 NOTE — Progress Notes (Signed)
PROGRESS NOTE    Adam Reid  ZOX:[id number] DOB: 02-18-1946 DOA: 12/13/2021 PCP: [name], MD  76 y.o. 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 CT demonstrated enlargement of retroperitoneal mass. The patient was found to be retaining urine, so foley catheter was placed and he was admitted for AKI on stage IV CKD with nephrology, cardiology, and palliative care consulted. Renal function is stable with diuresis. Patient requested discontinuation of foley but has persistently showed evidence of retention, so coude catheter was placed 6/6.  -Seen by oncology, recommended evaluation by surgical oncology -Overall prognosis felt to be poor, s/p Palliative care meeting with patient and family 6/6 with determination that the patient would like no restrictions to medical care,  hope to pursue measures with curative intent.  -Dr.Grunz discussed in detail with endocrinology consultant and surgical oncology at [facility] on 6/6 who recommended the work up as ordered and follow up after discharge. Did not recommend inpatient transfer for management of paraganglioma.  -6/8: -> DNR   Subjective: -Developed severe dysuria and pelvic discomfort early this morning, breathing okay, some nausea, no vomiting,+ anorexia  Assessment and Plan:  Acute on chronic HFpEF: Echo shows LVEF 55-60%, mild basal-septal LVH and grade III diastolic dysfunction, elevated PASP and RV enlargement, IVC dilated. Moderate MR. -Diuresed with IV Lasix initially, diuretics on hold since then creatinine up to 5 now, BUN is 108 with some uremic symptoms -He is 5.6 L negative, urine output is good -GDMT limited by CKD -Cardiology signed off, not much further to offer   AKI on stage IV CKD:  -Secondary to  cardiorenal syndrome and urinary retention -Now back with a coud catheter for recurrent retention -Nephrology following, diuretics on hold, BUN and creatinine with mild worsening now stable, has some marginal uremic symptoms as well, urine output is still fair, anticipate need for dialysis in the near future, palliative care following as well -Overall prognosis is poor   Acute urinary retention: BPH noted on prior imaging -On admission and required Foley catheter for retention and AKI , patient requested discontinuation of foley due to pain despite lidocaine and oral medications.  -Developed recurrent retention,  coud catheter was placed on 6/6  -Now started on Cardura in the setting of paraganglioma as well  -6/10 AM with dysuria, will attempt to change coud catheter and then check UA and cultures, with worsening leukocytosis will give a dose of ceftriaxone today  Retroperitoneal paraganglioma: Enlarging. 6.7 x 5.4 cm less than a month ago, now 6.9 x 6.1 x 7.9 cm. Location abuts the infrarenal aorta.  - D/w oncology who recommended endocrinology and surgical evaluation. Surgery locally was contacted and deferred to tertiary care centers.  Dr. Bonner Puna discussed with Dr. Cam Hai who is an endocrine surgeon at [facility]. He recommended starting doxazosin and continuing this until follow up. He will have his office reach out to patient's wife to schedule follow up next week. Of note, the enlargement seen on repeat CT may be related to bleeding from biopsy and not true tumor proliferation. Dr.Randle specifically stated he would not recommend any surgical intervention at this time, so transfer for this reason would be futile.  -Dr. Bonner Puna also discussed with endocrinology, Dr. Buddy Duty, who confirmed work up with urine metanephrines, urine  catecholamines, and plasma metanephrines is the appropriate next step in hopes of determining whether this is functioning vs. not hormonally actively functioning tumor.  No further recommendations acutely, but will be happy to follow up with the patient after discharge.    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  - No ESA with malignancy. Of note, had an appointment scheduled as outpatient for feraheme and retacrit on 6/7. -Given IV iron 6/6, 6/7 -Hemoglobin with slight improvement   T2DM:  -CBGs now stable - Very sensitive SSI given his malnutrition and renal impairment.  - Continue formulary -gliptin, hold [facility]    Moderate protein calorie malnutrition: Body mass index is 18.54 kg/m. Suspect significant catabolic state given enlarging mass with central necrosis.  - Supplement protein as much as possible.    PAF/flutter: Currently in NSR. Last TSH 4.9. - Continue eliquis - Continue diltiazem, amiodarone   CAD, aortic atherosclerosis: - Continue statin, anticoagulation for now.    Depression: Quiescent - Continue SSRI  Goals of care discussions Overall prognosis is felt to be poor in the setting of CHF, CKD4-5, severe malnutrition, large retroperitoneal paraganglioma, ongoing failure to thrive.  Discussed poor prognosis with the patient again 6/7 and 6/8 -Seen by palliative care 6/6, 6/7: Poor prognosis discussed ,  -I called and discussed with wife 6/8, she said that spouse and family is making decisions on his behalf, I recommended DNR, she was agreeable, CODE STATUS changed    DVT prophylaxis: Apixaban Code Status: DNR, called and d/w wife Family Communication: Discussed with patient in detail using interpreter, no family at bedside, called and updated wife 6/8 Disposition Plan: To be determined, continues to decline, prognosis is poor  Consultants: Nephrology, cardiology   Procedures:   Antimicrobials:    Objective: Vitals:   12/18/21 0806 12/18/21 1039 12/18/21 1953 12/19/21 0515  BP: (!) 127/55 137/72 (!) 117/56 (!) 142/72  Pulse: 80 88 65 90  Resp: '17 16 14 13   '$ Temp: 97.6 F (36.4 C) 97.8 F (36.6 C) 98.4 F (36.9 C) 97.8 F (36.6 C)  TempSrc: Oral Oral Oral Oral  SpO2: 98% 95% 94% 97%  Weight:    51.7 kg  Height:        Intake/Output Summary (Last 24 hours) at 12/19/2021 1108 Last data filed at 12/19/2021 0536 Gross per 24 hour  Intake 437 ml  Output 1050 ml  Net -613 ml   Filed Weights   12/17/21 0306 12/18/21 0300 12/19/21 0515  Weight: 51.7 kg 52.4 kg 51.7 kg    Examination:  General exam: Frail elderly chronically ill male sitting up in bed, AAOx3, no distress HEENT: Positive JVD CVS: S1-S2, regular rhythm, systolic murmur Lungs: Few basilar rales Abdomen: Soft, nontender, bowel sounds present Extremities: Trace edema  Skin: No rashes Psychiatry:  Mood & affect appropriate.     Data Reviewed:   CBC: Recent Labs  Lab 12/13/21 1011 12/16/21 0506 12/17/21 0402 12/18/21 0349 12/19/21 0229  WBC 12.4* 8.6 10.8* 9.7 12.2*  NEUTROABS 11.3*  --   --   --   --   HGB 9.6* 7.7* 7.7* 7.8* 8.0*  HCT 29.5* 23.9* 23.5* 23.5* 24.3*  MCV 84.5 85.4 86.4 86.1 86.2  PLT 386 298 276 264 992   Basic Metabolic Panel: Recent Labs  Lab 12/13/21 1011 12/14/21 0347 12/15/21 0426 12/16/21 0506 12/17/21 0402 12/18/21 0349 12/19/21 0229  NA 137   < > 133* 131* 132* 132* 131*  K  3.7   < > 4.2 3.8 4.4 3.8 4.4  CL 100   < > 99 92* 96* 95* 92*  CO2 21*   < > '22 22 22 25 25  '$ GLUCOSE 290*   < > 88 117* 153* 112* 139*  BUN 106*   < > 99* 107* 108* 106* 103*  CREATININE 4.75*   < > 4.60* 4.97* 5.02* 5.04* 5.03*  CALCIUM 8.6*   < > 8.3* 8.4* 8.4* 8.3* 8.5*  MG 2.1  --   --   --   --   --   --   PHOS  --   --   --   --  8.0* 7.5* 7.0*   < > = values in this interval not displayed.   GFR: Estimated Creatinine Clearance: 9.4 mL/min (A) (by C-G formula based on SCr of 5.03 mg/dL (H)). Liver Function Tests: Recent Labs  Lab 12/17/21 0402 12/18/21 0349 12/19/21 0229  ALBUMIN 2.8* 2.9* 3.0*   No results for input(s): "LIPASE",  "AMYLASE" in the last 168 hours. No results for input(s): "AMMONIA" in the last 168 hours. Coagulation Profile: No results for input(s): "INR", "PROTIME" in the last 168 hours. Cardiac Enzymes: Recent Labs  Lab 12/13/21 1914  CKTOTAL 93   BNP (last 3 results) Recent Labs    12/10/21 1339  PROBNP 12,645*   HbA1C: No results for input(s): "HGBA1C" in the last 72 hours. CBG: Recent Labs  Lab 12/18/21 0605 12/18/21 1037 12/18/21 1638 12/18/21 2103 12/19/21 0610  GLUCAP 165* 197* 302* 222* 165*   Lipid Profile: No results for input(s): "CHOL", "HDL", "LDLCALC", "TRIG", "CHOLHDL", "LDLDIRECT" in the last 72 hours. Thyroid Function Tests: No results for input(s): "TSH", "T4TOTAL", "FREET4", "T3FREE", "THYROIDAB" in the last 72 hours. Anemia Panel: No results for input(s): "VITAMINB12", "FOLATE", "FERRITIN", "TIBC", "IRON", "RETICCTPCT" in the last 72 hours. Urine analysis:    Component Value Date/Time   COLORURINE STRAW (A) 12/17/2021 1052   APPEARANCEUR CLEAR 12/17/2021 1052   LABSPEC 1.009 12/17/2021 1052   PHURINE 5.0 12/17/2021 1052   GLUCOSEU 50 (A) 12/17/2021 1052   HGBUR MODERATE (A) 12/17/2021 1052   BILIRUBINUR NEGATIVE 12/17/2021 1052   BILIRUBINUR negative 03/02/2021 1458   BILIRUBINUR neg 03/23/2015 1404   KETONESUR NEGATIVE 12/17/2021 1052   PROTEINUR 30 (A) 12/17/2021 1052   UROBILINOGEN 0.2 03/02/2021 1458   NITRITE NEGATIVE 12/17/2021 1052   LEUKOCYTESUR TRACE (A) 12/17/2021 1052   Sepsis Labs: '@LABRCNTIP'$ (procalcitonin:4,lacticidven:4)  ) Recent Results (from the past 240 hour(s))  Urine Culture     Status: None   Collection Time: 12/14/21  6:41 AM   Specimen: In/Out Cath Urine  Result Value Ref Range Status   Specimen Description IN/OUT CATH URINE  Final   Special Requests NONE  Final   Culture   Final    NO GROWTH Performed at [facility], [facility] [address], [place], [place] [address]    Report Status 12/15/2021 FINAL  Final      Radiology Studies: No results found.   Scheduled Meds:  amiodarone  200 mg Oral Daily   apixaban  2.5 mg Oral BID   atorvastatin  80 mg Oral Daily   calcium acetate  667 mg Oral TID with meals   Chlorhexidine Gluconate Cloth  6 each Topical Daily   citalopram  20 mg Oral Daily   diltiazem  180 mg Oral Daily   doxazosin  4 mg Oral Daily   finasteride  5 mg Oral Daily  HYDROmorphone (DILAUDID) injection  0.5 mg Intravenous Once   insulin aspart  0-6 Units Subcutaneous TID WC   lidocaine  1 application  Urethral Once   linagliptin  5 mg Oral Daily   metolazone  5 mg Oral Daily   polyethylene glycol  17 g Oral Daily   senna-docusate  1 tablet Oral BID   sodium bicarbonate  650 mg Oral BID   sodium chloride flush  3 mL Intravenous Q12H   Continuous Infusions:  sodium chloride     cefTRIAXone (ROCEPHIN)  IV       LOS: 6 days    Time spent: 66mn   PDomenic Polite MD [facility]   12/19/2021, 11:08 AM

## 2021-12-19 NOTE — Progress Notes (Addendum)
On reassessment patient still moaning and stated he has not had relief, Md made aware.

## 2021-12-20 DIAGNOSIS — R339 Retention of urine, unspecified: Secondary | ICD-10-CM

## 2021-12-20 DIAGNOSIS — I130 Hypertensive heart and chronic kidney disease with heart failure and stage 1 through stage 4 chronic kidney disease, or unspecified chronic kidney disease: Secondary | ICD-10-CM | POA: Diagnosis not present

## 2021-12-20 DIAGNOSIS — R627 Adult failure to thrive: Secondary | ICD-10-CM

## 2021-12-20 DIAGNOSIS — Z515 Encounter for palliative care: Secondary | ICD-10-CM | POA: Diagnosis not present

## 2021-12-20 DIAGNOSIS — I509 Heart failure, unspecified: Secondary | ICD-10-CM | POA: Diagnosis not present

## 2021-12-20 DIAGNOSIS — N179 Acute kidney failure, unspecified: Secondary | ICD-10-CM | POA: Diagnosis not present

## 2021-12-20 DIAGNOSIS — D447 Neoplasm of uncertain behavior of aortic body and other paraganglia: Secondary | ICD-10-CM | POA: Diagnosis present

## 2021-12-20 LAB — GLUCOSE, CAPILLARY
Glucose-Capillary: 163 mg/dL — ABNORMAL HIGH (ref 70–99)
Glucose-Capillary: 182 mg/dL — ABNORMAL HIGH (ref 70–99)
Glucose-Capillary: 217 mg/dL — ABNORMAL HIGH (ref 70–99)
Glucose-Capillary: 230 mg/dL — ABNORMAL HIGH (ref 70–99)

## 2021-12-20 LAB — CBC
HCT: 23.7 % — ABNORMAL LOW (ref 39.0–52.0)
Hemoglobin: 7.4 g/dL — ABNORMAL LOW (ref 13.0–17.0)
MCH: 27.2 pg (ref 26.0–34.0)
MCHC: 31.2 g/dL (ref 30.0–36.0)
MCV: 87.1 fL (ref 80.0–100.0)
Platelets: 271 10*3/uL (ref 150–400)
RBC: 2.72 MIL/uL — ABNORMAL LOW (ref 4.22–5.81)
RDW: 18.1 % — ABNORMAL HIGH (ref 11.5–15.5)
WBC: 9.2 10*3/uL (ref 4.0–10.5)
nRBC: 0 % (ref 0.0–0.2)

## 2021-12-20 LAB — URINE CULTURE: Culture: NO GROWTH

## 2021-12-20 LAB — RENAL FUNCTION PANEL
Albumin: 2.8 g/dL — ABNORMAL LOW (ref 3.5–5.0)
Anion gap: 13 (ref 5–15)
BUN: 97 mg/dL — ABNORMAL HIGH (ref 8–23)
CO2: 26 mmol/L (ref 22–32)
Calcium: 8.6 mg/dL — ABNORMAL LOW (ref 8.9–10.3)
Chloride: 95 mmol/L — ABNORMAL LOW (ref 98–111)
Creatinine, Ser: 4.72 mg/dL — ABNORMAL HIGH (ref 0.61–1.24)
GFR, Estimated: 12 mL/min — ABNORMAL LOW (ref >60)
Glucose, Bld: 148 mg/dL — ABNORMAL HIGH (ref 70–99)
Phosphorus: 6.5 mg/dL — ABNORMAL HIGH (ref 2.5–4.6)
Potassium: 3.7 mmol/L (ref 3.5–5.1)
Sodium: 134 mmol/L — ABNORMAL LOW (ref 135–145)

## 2021-12-20 MED ORDER — SODIUM CHLORIDE 0.9 % IV SOLN
250.0000 mg | Freq: Every day | INTRAVENOUS | Status: AC
  Administered 2021-12-20 – 2021-12-21 (×2): 250 mg via INTRAVENOUS
  Filled 2021-12-20 (×2): qty 20

## 2021-12-20 MED ORDER — SODIUM CHLORIDE 0.9 % IV SOLN: 510.0000 mg | Freq: Once | INTRAVENOUS | Status: DC

## 2021-12-20 MED ORDER — BISACODYL 10 MG RE SUPP
10.0000 mg | Freq: Once | RECTAL | Status: AC
  Administered 2021-12-20: 10 mg via RECTAL
  Filled 2021-12-20: qty 1

## 2021-12-20 MED ORDER — LACTULOSE 10 GM/15ML PO SOLN
20.0000 g | Freq: Two times a day (BID) | ORAL | Status: DC
  Administered 2021-12-20 – 2021-12-22 (×4): 20 g via ORAL
  Filled 2021-12-20 (×5): qty 30

## 2021-12-20 NOTE — Progress Notes (Addendum)
PROGRESS NOTE    Kipp Shank  ZOX:[id number] DOB: 1946/03/05 DOA: 12/13/2021 PCP: [name], MD  76 y.o. 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 CT demonstrated enlargement of retroperitoneal mass. The patient was found to be retaining urine, so foley catheter was placed and he was admitted for AKI on stage IV CKD with nephrology, cardiology, and palliative care consulted. Patient requested discontinuation of foley but had recurrent retention, so coude catheter was placed 6/6.  -Seen by oncology, recommended evaluation by surgical oncology -Overall prognosis felt to be poor, s/p Palliative care meeting with patient and family 6/6 with determination that the patient would like no restrictions to medical care,  hope to pursue measures with curative intent.  -Dr.Grunz discussed in detail with endocrinology consultant and surgical oncology at [facility] on 6/6 who recommended the work up as ordered and follow up after discharge. Did not recommend inpatient transfer for management of paraganglioma.  -6/8: -> DNR   Subjective: -Feels okay overall, per staff oral intake is very poor eats scant amount for breakfast only and then nothing else through the rest of the day, he denies much dyspnea however is mostly in bed, some nausea  Assessment and Plan:  Acute on chronic HFpEF: Echo shows LVEF 55-60%, mild basal-septal LVH and grade III diastolic dysfunction, elevated PASP and RV enlargement, IVC dilated. Moderate MR. -Diuresed with IV Lasix initially, diuretics on hold now, BUN is 108 with some uremic symptoms, creatinine has been around 5, given IV fluids by nephrology yesterday, down to 4.7 -Continues to have good urine output, frequent hospitalizations with fluid overload  in the last few months -GDMT limited by CKD -Cardiology signed off, not much further to offer   AKI on stage IV CKD:  -Secondary to cardiorenal syndrome and urinary retention -Now back with a coud catheter for recurrent retention -Nephrology following, diuresed initially, subsequently diuretics on hold, BUN and creatinine with mild worsening now stable, has some marginal uremic symptoms as well, urine output is still fair, anticipate need for dialysis in the near future, palliative care following as well -Overall prognosis is poor, palliative care to discuss with patient and family again today, not a good dialysis candidate   Acute urinary retention: BPH noted on prior imaging -On admission and required Foley catheter for retention and AKI , patient requested discontinuation of foley due to pain despite lidocaine and oral medications.  -Developed recurrent retention,  coud catheter was placed on 6/6  -Now started on Cardura in the setting of paraganglioma as well  -6/10 AM with dysuria, coud catheter change 6/10, symptoms improving, UA with scant hematuria  Retroperitoneal paraganglioma: Enlarging. 6.7 x 5.4 cm less than a month ago, now 6.9 x 6.1 x 7.9 cm. Location abuts the infrarenal aorta.  - D/w oncology who recommended endocrinology and surgical evaluation. Surgery locally was contacted and deferred to tertiary care centers.  Dr. Bonner Puna discussed with Dr. Cam Hai who is an endocrine surgeon at [place]. He recommended starting doxazosin and continuing this until follow up. He will have his office reach out to patient's wife to schedule follow up next week. Of note, the enlargement seen on repeat CT may be related to bleeding from biopsy and not true tumor proliferation. Dr.Randle specifically stated he would  not recommend any surgical intervention at this time, so transfer for this reason would be futile.  -Dr. Bonner Puna also discussed with endocrinology, Dr. Buddy Duty, who confirmed work up  with urine metanephrines, urine catecholamines, and plasma metanephrines is the appropriate next step in hopes of determining whether this is functioning vs. not hormonally actively functioning tumor. No further recommendations acutely, but will be happy to follow up with the patient after discharge.    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  - No ESA with malignancy. Of note, had an appointment scheduled as outpatient for feraheme and retacrit on 6/7. -Given IV iron 6/6, 6/7 -Hemoglobin down to 7.4 after hydration yesterday suspect dilutional component, will repeat IV iron   T2DM:  -CBGs now stable - Very sensitive SSI given his malnutrition and renal impairment.  - Continue formulary -gliptin, hold [facility]   Constipation -Continue Senokot and MiraLAX, add lactulose and Dulcolax suppository   Moderate protein calorie malnutrition: Body mass index is 18.54 kg/m. Suspect significant catabolic state given enlarging mass with central necrosis.  - Supplement protein as much as possible.    PAF/flutter: Currently in NSR. Last TSH 4.9. - Continue eliquis - Continue diltiazem, amiodarone   CAD, aortic atherosclerosis: - Continue statin, anticoagulation for now.    Depression: Quiescent - Continue SSRI  Goals of care discussions Overall prognosis is felt to be poor in the setting of CHF, CKD4-5, severe malnutrition, large retroperitoneal paraganglioma, ongoing failure to thrive.  Discussed poor prognosis with the patient again 6/7 and 6/8 -Seen by palliative care 6/6, 6/7: Poor prognosis discussed ,  -I called and discussed with wife 6/8, she said that spouse and family is making decisions on his behalf, I recommended DNR, she was agreeable, CODE STATUS changed, called and discussed poor prognosis again with patient's spouse 6/11    DVT prophylaxis: Apixaban Code Status: DNR, after d/w wife Family Communication: no family  at bedside, called and updated wife 6/8, 6/9, 6/11 Disposition Plan: To be determined, continues to decline, prognosis is poor  Consultants: Nephrology, cardiology   Procedures:   Antimicrobials:    Objective: Vitals:   12/19/21 0515 12/19/21 1928 12/20/21 0358 12/20/21 0821  BP: (!) 142/72 118/60 126/66 132/64  Pulse: 90 72 78 79  Resp: 13 (!) '21 15 17  '$ Temp: 97.8 F (36.6 C) 97.9 F (36.6 C) 98.2 F (36.8 C)   TempSrc: Oral Oral Oral   SpO2: 97% 94% 97% 94%  Weight: 51.7 kg  51.6 kg   Height:        Intake/Output Summary (Last 24 hours) at 12/20/2021 0954 Last data filed at 12/20/2021 7616 Gross per 24 hour  Intake 1397.42 ml  Output 1600 ml  Net -202.58 ml   Filed Weights   12/18/21 0300 12/19/21 0515 12/20/21 0358  Weight: 52.4 kg 51.7 kg 51.6 kg    Examination:  General exam: Frail elderly male sitting up in bed, AAOx3, no distress HEENT: Positive JVD CVS: S1-S2, regular rhythm, systolic murmur Lungs: Few basilar rales Abdomen: Soft, nontender, bowel sounds present Extremities: Trace edema  Skin: No rashes Psychiatry:  Mood & affect appropriate.     Data Reviewed:   CBC: Recent Labs  Lab 12/13/21 1011 12/16/21 0506 12/17/21 0402 12/18/21 0349 12/19/21 0229 12/20/21 0350  WBC 12.4* 8.6 10.8* 9.7 12.2* 9.2  NEUTROABS 11.3*  --   --   --   --   --   HGB 9.6* 7.7* 7.7* 7.8*  8.0* 7.4*  HCT 29.5* 23.9* 23.5* 23.5* 24.3* 23.7*  MCV 84.5 85.4 86.4 86.1 86.2 87.1  PLT 386 298 276 264 272 829   Basic Metabolic Panel: Recent Labs  Lab 12/13/21 1011 12/14/21 0347 12/16/21 0506 12/17/21 0402 12/18/21 0349 12/19/21 0229 12/20/21 0350  NA 137   < > 131* 132* 132* 131* 134*  K 3.7   < > 3.8 4.4 3.8 4.4 3.7  CL 100   < > 92* 96* 95* 92* 95*  CO2 21*   < > '22 22 25 25 26  '$ GLUCOSE 290*   < > 117* 153* 112* 139* 148*  BUN 106*   < > 107* 108* 106* 103* 97*  CREATININE 4.75*   < > 4.97* 5.02* 5.04* 5.03* 4.72*  CALCIUM 8.6*   < > 8.4* 8.4* 8.3*  8.5* 8.6*  MG 2.1  --   --   --   --   --   --   PHOS  --   --   --  8.0* 7.5* 7.0* 6.5*   < > = values in this interval not displayed.   GFR: Estimated Creatinine Clearance: 10 mL/min (A) (by C-G formula based on SCr of 4.72 mg/dL (H)). Liver Function Tests: Recent Labs  Lab 12/17/21 0402 12/18/21 0349 12/19/21 0229 12/20/21 0350  ALBUMIN 2.8* 2.9* 3.0* 2.8*   No results for input(s): "LIPASE", "AMYLASE" in the last 168 hours. No results for input(s): "AMMONIA" in the last 168 hours. Coagulation Profile: No results for input(s): "INR", "PROTIME" in the last 168 hours. Cardiac Enzymes: Recent Labs  Lab 12/13/21 1914  CKTOTAL 93   BNP (last 3 results) Recent Labs    12/10/21 1339  PROBNP 12,645*   HbA1C: No results for input(s): "HGBA1C" in the last 72 hours. CBG: Recent Labs  Lab 12/19/21 0610 12/19/21 1219 12/19/21 1658 12/19/21 2111 12/20/21 0616  GLUCAP 165* 116* 155* 157* 163*   Lipid Profile: No results for input(s): "CHOL", "HDL", "LDLCALC", "TRIG", "CHOLHDL", "LDLDIRECT" in the last 72 hours. Thyroid Function Tests: No results for input(s): "TSH", "T4TOTAL", "FREET4", "T3FREE", "THYROIDAB" in the last 72 hours. Anemia Panel: No results for input(s): "VITAMINB12", "FOLATE", "FERRITIN", "TIBC", "IRON", "RETICCTPCT" in the last 72 hours. Urine analysis:    Component Value Date/Time   COLORURINE YELLOW 12/19/2021 1423   APPEARANCEUR HAZY (A) 12/19/2021 1423   LABSPEC 1.011 12/19/2021 1423   PHURINE 6.0 12/19/2021 1423   GLUCOSEU NEGATIVE 12/19/2021 1423   HGBUR LARGE (A) 12/19/2021 1423   BILIRUBINUR NEGATIVE 12/19/2021 1423   BILIRUBINUR negative 03/02/2021 1458   BILIRUBINUR neg 03/23/2015 1404   KETONESUR NEGATIVE 12/19/2021 1423   PROTEINUR 100 (A) 12/19/2021 1423   UROBILINOGEN 0.2 03/02/2021 1458   NITRITE NEGATIVE 12/19/2021 1423   LEUKOCYTESUR TRACE (A) 12/19/2021 1423   Sepsis Labs: '@LABRCNTIP'$ (procalcitonin:4,lacticidven:4)  ) Recent  Results (from the past 240 hour(s))  Urine Culture     Status: None   Collection Time: 12/14/21  6:41 AM   Specimen: In/Out Cath Urine  Result Value Ref Range Status   Specimen Description IN/OUT CATH URINE  Final   Special Requests NONE  Final   Culture   Final    NO GROWTH Performed at [facility], [place] [address], [place], [place] [address]    Report Status 12/15/2021 FINAL  Final     Radiology Studies: No results found.   Scheduled Meds:  amiodarone  200 mg Oral Daily   apixaban  2.5 mg Oral  BID   atorvastatin  80 mg Oral Daily   calcium acetate  667 mg Oral TID with meals   Chlorhexidine Gluconate Cloth  6 each Topical Daily   citalopram  20 mg Oral Daily   diltiazem  180 mg Oral Daily   doxazosin  4 mg Oral Daily   finasteride  5 mg Oral Daily    HYDROmorphone (DILAUDID) injection  0.5 mg Intravenous Once   insulin aspart  0-6 Units Subcutaneous TID WC   linagliptin  5 mg Oral Daily   metolazone  5 mg Oral Daily   polyethylene glycol  17 g Oral Daily   senna-docusate  1 tablet Oral BID   sodium bicarbonate  650 mg Oral BID   sodium chloride flush  3 mL Intravenous Q12H   Continuous Infusions:  sodium chloride       LOS: 7 days    Time spent: 2mn   PDomenic Polite MD [facility]   12/20/2021, 9:54 AM

## 2021-12-20 NOTE — Progress Notes (Signed)
Patient ID: Adam Reid, male   DOB: 1945-10-09, 76 y.o.   MRN: [id number] S: Some gross hematuria this morning in foley bag.  No new complaints. O:BP 132/64 (BP Location: Left Arm)   Pulse 79   Temp 98.2 F (36.8 C) (Oral)   Resp 17   Ht '5\' 6"'$  (1.676 m)   Wt 51.6 kg   SpO2 94%   BMI 18.37 kg/m   Intake/Output Summary (Last 24 hours) at 12/20/2021 1059 Last data filed at 12/20/2021 0814 Gross per 24 hour  Intake 1397.42 ml  Output 1600 ml  Net -202.58 ml   Intake/Output: I/O last 3 completed shifts: In: 1597.4 [P.O.:920; I.V.:577.4; IV Piggyback:100] Out: 2650 [Urine:2650]  Intake/Output this shift:  Total I/O In: 480 [P.O.:480] Out: -  Weight change: -0.081 kg Gen:NAD CVS: RRR Resp:CTA Abd: +BS, soft, NT/ND Ext: no edema  Recent Labs  Lab 12/14/21 0347 12/15/21 0426 12/16/21 0506 12/17/21 0402 12/18/21 0349 12/19/21 0229 12/20/21 0350  NA 138 133* 131* 132* 132* 131* 134*  K 3.7 4.2 3.8 4.4 3.8 4.4 3.7  CL 104 99 92* 96* 95* 92* 95*  CO2 '22 22 22 22 25 25 26  '$ GLUCOSE 169* 88 117* 153* 112* 139* 148*  BUN 104* 99* 107* 108* 106* 103* 97*  CREATININE 4.49* 4.60* 4.97* 5.02* 5.04* 5.03* 4.72*  ALBUMIN  --   --   --  2.8* 2.9* 3.0* 2.8*  CALCIUM 8.5* 8.3* 8.4* 8.4* 8.3* 8.5* 8.6*  PHOS  --   --   --  8.0* 7.5* 7.0* 6.5*   Liver Function Tests: Recent Labs  Lab 12/18/21 0349 12/19/21 0229 12/20/21 0350  ALBUMIN 2.9* 3.0* 2.8*   No results for input(s): "LIPASE", "AMYLASE" in the last 168 hours. No results for input(s): "AMMONIA" in the last 168 hours. CBC: Recent Labs  Lab 12/16/21 0506 12/17/21 0402 12/18/21 0349 12/19/21 0229 12/20/21 0350  WBC 8.6 10.8* 9.7 12.2* 9.2  HGB 7.7* 7.7* 7.8* 8.0* 7.4*  HCT 23.9* 23.5* 23.5* 24.3* 23.7*  MCV 85.4 86.4 86.1 86.2 87.1  PLT 298 276 264 272 271   Cardiac Enzymes: Recent Labs  Lab 12/13/21 1914  CKTOTAL 93   CBG: Recent Labs  Lab 12/19/21 1219 12/19/21 1658 12/19/21 2111 12/20/21 0616  12/20/21 1046  GLUCAP 116* 155* 157* 163* 182*    Iron Studies: No results for input(s): "IRON", "TIBC", "TRANSFERRIN", "FERRITIN" in the last 72 hours. Studies/Results: No results found.  amiodarone  200 mg Oral Daily   apixaban  2.5 mg Oral BID   atorvastatin  80 mg Oral Daily   bisacodyl  10 mg Rectal Once   calcium acetate  667 mg Oral TID with meals   Chlorhexidine Gluconate Cloth  6 each Topical Daily   citalopram  20 mg Oral Daily   diltiazem  180 mg Oral Daily   doxazosin  4 mg Oral Daily   finasteride  5 mg Oral Daily    HYDROmorphone (DILAUDID) injection  0.5 mg Intravenous Once   insulin aspart  0-6 Units Subcutaneous TID WC   lactulose  20 g Oral BID   linagliptin  5 mg Oral Daily   metolazone  5 mg Oral Daily   polyethylene glycol  17 g Oral Daily   senna-docusate  1 tablet Oral BID   sodium bicarbonate  650 mg Oral BID   sodium chloride flush  3 mL Intravenous Q12H    BMET    Component Value Date/Time   NA  134 (L) 12/20/2021 0350   NA 137 06/19/2020 0936   K 3.7 12/20/2021 0350   CL 95 (L) 12/20/2021 0350   CO2 26 12/20/2021 0350   GLUCOSE 148 (H) 12/20/2021 0350   BUN 97 (H) 12/20/2021 0350   BUN 51 (H) 06/19/2020 0936   CREATININE 4.72 (H) 12/20/2021 0350   CREATININE 2.68 (H) 03/26/2016 1839   CALCIUM 8.6 (L) 12/20/2021 0350   GFRNONAA 12 (L) 12/20/2021 0350   GFRAA 21 (L) 06/19/2020 0936   CBC    Component Value Date/Time   WBC 9.2 12/20/2021 0350   RBC 2.72 (L) 12/20/2021 0350   HGB 7.4 (L) 12/20/2021 0350   HGB 9.1 (L) 09/14/2019 1555   HCT 23.7 (L) 12/20/2021 0350   HCT 28.4 (L) 09/14/2019 1555   PLT 271 12/20/2021 0350   PLT 464 (H) 09/14/2019 1555   MCV 87.1 12/20/2021 0350   MCV 82 09/14/2019 1555   MCH 27.2 12/20/2021 0350   MCHC 31.2 12/20/2021 0350   RDW 18.1 (H) 12/20/2021 0350   RDW 16.4 (H) 09/14/2019 1555   LYMPHSABS 0.4 (L) 12/13/2021 1011   LYMPHSABS 0.6 (L) 10/05/2017 1433   MONOABS 0.5 12/13/2021 1011   EOSABS 0.1  12/13/2021 1011   EOSABS 0.1 10/05/2017 1433   BASOSABS 0.1 12/13/2021 1011   BASOSABS 0.0 10/05/2017 1433     Assessment/Plan:  AKI/CKD stage IV - presumably combination of urinary retention and cardiorenal syndrome in the setting of acute on chronic CHF.  BUN/Cr have reached a plateau.  Good UOP after foley catheter and stopping IV lasix.  No indication for hemodialysis, however we may need to discuss the possible need for HD if his renal function continues to worsen.  His BUN/CR have reached a plateau.  He is a marginal candidate for long-term dialysis given all of his co-morbidities.  He normally follows with [facility] [name] at [facility] and will need hospital follow up after discharge.  Avoid nephrotoxic agents such as IV contrast, NSAIDs, phosphorous containing bowel preps.  Renal dose meds. Continue to follow UOP and daily Scr.  Stopped lasix 80 mg IV daily due to rise in Scr on 12/16/21 but he remains negative with I&O's.  BUN/Cr improved after given IV NS 500 mL over 10 hours. He still remains 5 liters negative since admission. Acute on chronic diastolic CHF - improved.  Palliative care following for worsening cardiac function and FTT.  He is now DNR P. 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  Surgery recommends transfer to tertiary care center given high risk.  Declined for transfer but recommended to start alpha blocking agent.  Will need outpatient follow up. Urinary retention - foley replaced.  Predated hospitalization.  DM type 2 - per primary HTN - stable Anemia of CKD stage IV - follow H/H.  Hold off on ESA given recent diagnosis of paraganglioma.  Will defer management to Oncology.  Given IV iron.  Transfuse for Hgb <7.    Donetta Potts, MD [facility]

## 2021-12-20 NOTE — Progress Notes (Signed)
Patient consent to dulcolax suppository via wife's translation, RN and NT at bedside. Patient VSS. RN educated patient on informing nursing staff when the urge to go have a BM. Patient verbalize understanding.   1845: RN assisted patient to bathroom to have BM. Patient has one small BM. Patient relayed to RN that he wants to sit in the chair. Patient verbalize no abdomen discomfort, just "gas". RN did full linen bed change. Wife and son in the room. Call bell within reach.

## 2021-12-20 NOTE — Progress Notes (Signed)
Patient ID: Adam Reid, male   DOB: 04/02/1946, 76 y.o.   MRN: [id number]    Progress Note from the Palliative Medicine Team at [facility]   Patient Name: Adam Reid        Date: 12/20/2021 DOB: 12-28-1945  Age: 76 y.o. MRN#: [id number] Attending Physician: Domenic Polite, MD Primary Care Physician: [name], MD Admit Date: 12/13/2021   Medical records reviewed ,  discussed case with attending  Per intake H&P -->  76 year old male with a past medical history of chronic diastolic CHF, BPH, anemia chronic disease, stage IV CKD, type II DM, depression, hypertension, history of GI bleed, paroxysmal atrial fibrillation, secondary hyper parathyroidism, thyroid disease, history of cigarette smoking who presented to the emergency department with urinary burning, dyspnea and progressively worse edema.  Work-up shows BNP of over 2000 pg/mL, worsening renal function and retroperitoneal mass.    Palliative care asked to get involved in the setting of worsening kidney function and large left retroperitoneal paraglioma.   This NP visited patient at the bedside as a follow up to for palliative medicine needs and emotional support, and to meet with family again today   for ongoing education regarding goals of care, treatment option decisions and advance care planning.  Today patient is more alert and oral intake has improved some according to family.   Continued education offered on the complex current medical situation; chronic kidney disease stage IV, acute on chronic congestive heart failure, A-fib a flutter, paraganglioma, urinary retention, DM, HTN, anemia of chronic disease and overall failure to thrive.    Labs reviewed with family  I verbalized my concern for patient's overall continued failure to thrive and high risk for decompensation.  Both patient's wife and son verbalized an understanding of the seriousness of the patient's medical situation, however they remain hopeful  and are open to all offered and available medical interventions to prolong life.       Main concern today is patient's continued complaint of urethral burning being expressed to family.  Son is requesting urology consult.    In an effort to provide continuity and holistic care I spoke to both Dr. [name] and Dr. Marval Regal requesting them to speak to family at bedside.  Both plan to follow-up with family today at bedside.  Raised awareness and education/Conversation had regarding human mortality and the limitations of medical interventions to prolong quality of life when the body fails to thrive.  Education offered on the difference between aggressive medical intervention path and a palliative comfort path for this patient, at this time, in this situation.     Education offered today regarding  the importance of continued conversation with each other and the medical providers regarding overall plan of care and treatment options,  ensuring decisions are within the context of the patients values and GOCs.  Questions and concerns addressed   Discussed with Dr [name] and Dr Marval Regal   PMT will continue to support holistically   Wadie Lessen NP  Palliative Medicine Team Team Phone # [phone] Pager [phone]

## 2021-12-20 NOTE — Progress Notes (Signed)
Ok to change Feraheme to Ferrlecit due to formulary agent per Dr. [name].   Ferrlecit '250mg'$  IV q24 x2  Onnie Boer, PharmD, [place], AAHIVP, CPP Infectious Disease Pharmacist 12/20/2021 10:21 AM

## 2021-12-21 DIAGNOSIS — D447 Neoplasm of uncertain behavior of aortic body and other paraganglia: Secondary | ICD-10-CM | POA: Diagnosis not present

## 2021-12-21 DIAGNOSIS — I509 Heart failure, unspecified: Secondary | ICD-10-CM | POA: Diagnosis not present

## 2021-12-21 DIAGNOSIS — I130 Hypertensive heart and chronic kidney disease with heart failure and stage 1 through stage 4 chronic kidney disease, or unspecified chronic kidney disease: Secondary | ICD-10-CM | POA: Diagnosis not present

## 2021-12-21 DIAGNOSIS — N179 Acute kidney failure, unspecified: Secondary | ICD-10-CM | POA: Diagnosis not present

## 2021-12-21 DIAGNOSIS — Z515 Encounter for palliative care: Secondary | ICD-10-CM | POA: Diagnosis not present

## 2021-12-21 DIAGNOSIS — R339 Retention of urine, unspecified: Secondary | ICD-10-CM | POA: Diagnosis not present

## 2021-12-21 LAB — RENAL FUNCTION PANEL
Albumin: 2.9 g/dL — ABNORMAL LOW (ref 3.5–5.0)
Anion gap: 14 (ref 5–15)
BUN: 99 mg/dL — ABNORMAL HIGH (ref 8–23)
CO2: 25 mmol/L (ref 22–32)
Calcium: 8.4 mg/dL — ABNORMAL LOW (ref 8.9–10.3)
Chloride: 94 mmol/L — ABNORMAL LOW (ref 98–111)
Creatinine, Ser: 4.92 mg/dL — ABNORMAL HIGH (ref 0.61–1.24)
GFR, Estimated: 12 mL/min — ABNORMAL LOW (ref >60)
Glucose, Bld: 138 mg/dL — ABNORMAL HIGH (ref 70–99)
Phosphorus: 7.6 mg/dL — ABNORMAL HIGH (ref 2.5–4.6)
Potassium: 4.8 mmol/L (ref 3.5–5.1)
Sodium: 133 mmol/L — ABNORMAL LOW (ref 135–145)

## 2021-12-21 LAB — GLUCOSE, CAPILLARY
Glucose-Capillary: 175 mg/dL — ABNORMAL HIGH (ref 70–99)
Glucose-Capillary: 208 mg/dL — ABNORMAL HIGH (ref 70–99)
Glucose-Capillary: 83 mg/dL (ref 70–99)
Glucose-Capillary: 173 mg/dL — ABNORMAL HIGH (ref 70–99)

## 2021-12-21 LAB — CBC
HCT: 24.5 % — ABNORMAL LOW (ref 39.0–52.0)
Hemoglobin: 7.6 g/dL — ABNORMAL LOW (ref 13.0–17.0)
MCH: 27.2 pg (ref 26.0–34.0)
MCHC: 31 g/dL (ref 30.0–36.0)
MCV: 87.8 fL (ref 80.0–100.0)
Platelets: 264 10*3/uL (ref 150–400)
RBC: 2.79 MIL/uL — ABNORMAL LOW (ref 4.22–5.81)
RDW: 18.2 % — ABNORMAL HIGH (ref 11.5–15.5)
WBC: 11 10*3/uL — ABNORMAL HIGH (ref 4.0–10.5)
nRBC: 0 % (ref 0.0–0.2)

## 2021-12-21 MED ORDER — BISACODYL 10 MG RE SUPP
10.0000 mg | Freq: Once | RECTAL | Status: AC
  Administered 2021-12-21: 10 mg via RECTAL
  Filled 2021-12-21: qty 1

## 2021-12-21 NOTE — Plan of Care (Signed)
Pt constipated.  Started Lactulose BID as well as Senokot.  Dulcolax PR given x 1 during the day.  Pt has only had small hard round balls of stool returned as of this time.  PRN Zofran given this evening for nausea after straining to have a BM.  Pt is passing gas. Ayesha Mohair BSN RN [place] 12/21/2021, 1:44 AM  Problem: Elimination: Goal: Will not experience complications related to bowel motility Outcome: Not Progressing

## 2021-12-21 NOTE — Progress Notes (Signed)
Patient ID: Adam Reid, male   DOB: 1945-08-20, 76 y.o.   MRN: [id number]  Assessment/Plan:  AKI/CKD stage IV - presumably combination of urinary retention and cardiorenal syndrome in the setting of acute on chronic CHF.  BUN/Cr have reached a plateau.  Good UOP after foley catheter and stopping IV lasix.  No indication for hemodialysis, however we may need to discuss the possible need for HD if his renal function continues to worsen.  He is a marginal candidate for long-term dialysis given all of his co-morbidities.  He normally follows with Dr. [name] at [facility] and will need hospital follow up after discharge.  Avoid nephrotoxic agents such as IV contrast, NSAIDs, phosphorous containing bowel preps.  Renal dose meds. Continue to follow UOP and daily Scr.  Stopped lasix 80 mg IV daily due to rise in Scr on 12/16/21 but he remains negative with I&O's. May need to restart in the  next 24-72 hrs  He still remains 5.6 liters negative since admission. Had a good discussion with the son (optometrist in [place]); pt deferring his care to his son and wife. They will discuss whether he wants dialysis if needed; I question whether it will improve his QOL as he is a marginal candidate at best. If they want RRT if needed we will need to get [facility] on board to eval the rt BCF, whether it can be salvaged or he needs a new access. I'm not sure it can be salvaged.  Acute on chronic diastolic CHF - improved.  Palliative care following for worsening cardiac function and FTT.  He is now DNR P. 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  Surgery recommends transfer to tertiary care center given high risk.  Declined for transfer but recommended to start alpha blocking agent.  Will need outpatient follow up. Urinary retention - foley replaced.  Predated hospitalization.  DM type 2 - per primary HTN - stable Anemia of CKD stage IV - follow H/H.   Hold off on ESA given recent diagnosis of paraganglioma.  Will defer management to Oncology.  Given IV iron.  Transfuse for Hgb <7.    S: Dysuria continues but otherwise no new complaints. Son bedside and I answered all his questions.  O:BP 114/60 (BP Location: Left Arm)   Pulse 60   Temp 98.5 F (36.9 C) (Oral)   Resp 13   Ht '5\' 6"'$  (1.676 m)   Wt 53.1 kg   SpO2 97%   BMI 18.90 kg/m   Intake/Output Summary (Last 24 hours) at 12/21/2021 0857 Last data filed at 12/20/2021 2300 Gross per 24 hour  Intake 390 ml  Output 601 ml  Net -211 ml   Intake/Output: I/O last 3 completed shifts: In: 2836 [P.O.:840; I.V.:303; IV Piggyback:370] Out: 1401 [Urine:1400; Stool:1]  Intake/Output this shift:  No intake/output data recorded. Weight change: 1.497 kg Gen:NAD CVS: RRR Resp:CTA Abd: +BS, soft, NT/ND Ext: no edema GU: foley to gravity  Recent Labs  Lab 12/15/21 0426 12/16/21 0506 12/17/21 0402 12/18/21 0349 12/19/21 0229 12/20/21 0350 12/21/21 0325  NA 133* 131* 132* 132* 131* 134* 133*  K 4.2 3.8 4.4 3.8 4.4 3.7 4.8  CL 99 92* 96* 95* 92* 95* 94*  CO2 '22 22 22 25 25 26 25  '$ GLUCOSE 88 117* 153* 112* 139* 148* 138*  BUN 99* 107* 108* 106* 103* 97* 99*  CREATININE 4.60* 4.97* 5.02* 5.04* 5.03* 4.72* 4.92*  ALBUMIN  --   --  2.8* 2.9* 3.0* 2.8* 2.9*  CALCIUM 8.3* 8.4* 8.4* 8.3* 8.5* 8.6* 8.4*  PHOS  --   --  8.0* 7.5* 7.0* 6.5* 7.6*   Liver Function Tests: Recent Labs  Lab 12/19/21 0229 12/20/21 0350 12/21/21 0325  ALBUMIN 3.0* 2.8* 2.9*   No results for input(s): "LIPASE", "AMYLASE" in the last 168 hours. No results for input(s): "AMMONIA" in the last 168 hours. CBC: Recent Labs  Lab 12/17/21 0402 12/18/21 0349 12/19/21 0229 12/20/21 0350 12/21/21 0325  WBC 10.8* 9.7 12.2* 9.2 11.0*  HGB 7.7* 7.8* 8.0* 7.4* 7.6*  HCT 23.5* 23.5* 24.3* 23.7* 24.5*  MCV 86.4 86.1 86.2 87.1 87.8  PLT 276 264 272 271 264   Cardiac Enzymes: No results for input(s):  "CKTOTAL", "CKMB", "CKMBINDEX", "TROPONINI" in the last 168 hours.  CBG: Recent Labs  Lab 12/20/21 0616 12/20/21 1046 12/20/21 1701 12/20/21 2123 12/21/21 0554  GLUCAP 163* 182* 217* 230* 175*    Iron Studies: No results for input(s): "IRON", "TIBC", "TRANSFERRIN", "FERRITIN" in the last 72 hours. Studies/Results: No results found.  amiodarone  200 mg Oral Daily   apixaban  2.5 mg Oral BID   atorvastatin  80 mg Oral Daily   bisacodyl  10 mg Rectal Once   calcium acetate  667 mg Oral TID with meals   Chlorhexidine Gluconate Cloth  6 each Topical Daily   citalopram  20 mg Oral Daily   diltiazem  180 mg Oral Daily   doxazosin  4 mg Oral Daily   finasteride  5 mg Oral Daily    HYDROmorphone (DILAUDID) injection  0.5 mg Intravenous Once   insulin aspart  0-6 Units Subcutaneous TID WC   lactulose  20 g Oral BID   linagliptin  5 mg Oral Daily   metolazone  5 mg Oral Daily   polyethylene glycol  17 g Oral Daily   senna-docusate  1 tablet Oral BID   sodium bicarbonate  650 mg Oral BID   sodium chloride flush  3 mL Intravenous Q12H    BMET    Component Value Date/Time   NA 133 (L) 12/21/2021 0325   NA 137 06/19/2020 0936   K 4.8 12/21/2021 0325   CL 94 (L) 12/21/2021 0325   CO2 25 12/21/2021 0325   GLUCOSE 138 (H) 12/21/2021 0325   BUN 99 (H) 12/21/2021 0325   BUN 51 (H) 06/19/2020 0936   CREATININE 4.92 (H) 12/21/2021 0325   CREATININE 2.68 (H) 03/26/2016 1839   CALCIUM 8.4 (L) 12/21/2021 0325   GFRNONAA 12 (L) 12/21/2021 0325   GFRAA 21 (L) 06/19/2020 0936   CBC    Component Value Date/Time   WBC 11.0 (H) 12/21/2021 0325   RBC 2.79 (L) 12/21/2021 0325   HGB 7.6 (L) 12/21/2021 0325   HGB 9.1 (L) 09/14/2019 1555   HCT 24.5 (L) 12/21/2021 0325   HCT 28.4 (L) 09/14/2019 1555   PLT 264 12/21/2021 0325   PLT 464 (H) 09/14/2019 1555   MCV 87.8 12/21/2021 0325   MCV 82 09/14/2019 1555   MCH 27.2 12/21/2021 0325   MCHC 31.0 12/21/2021 0325   RDW 18.2 (H) 12/21/2021  0325   RDW 16.4 (H) 09/14/2019 1555   LYMPHSABS 0.4 (L) 12/13/2021 1011   LYMPHSABS 0.6 (L) 10/05/2017 1433   MONOABS 0.5 12/13/2021 1011   EOSABS 0.1 12/13/2021 1011   EOSABS 0.1 10/05/2017 1433   BASOSABS 0.1 12/13/2021 1011   BASOSABS 0.0 10/05/2017 1433

## 2021-12-21 NOTE — Care Management Important Message (Signed)
Important Message  Patient Details  Name: Adam Reid MRN: [id number] Date of Birth: 02-Sep-1945   Medicare Important Message Given:  Yes     Shelda Altes 12/21/2021, 7:49 AM

## 2021-12-21 NOTE — Progress Notes (Signed)
Patient ID: Adam Reid, male   DOB: 1946/01/01, 76 y.o.   MRN: [id number]    Progress Note from the Palliative Medicine Team at [facility]   Patient Name: Adam Reid        Date: 12/21/2021 DOB: 04-26-1946  Age: 76 y.o. MRN#: [id number] Attending Physician: Domenic Polite, MD Primary Care Physician: [name], MD Admit Date: 12/13/2021   Medical records reviewed ,  discussed case with attending  Per intake H&P -->  76 year old male with a past medical history of chronic diastolic CHF, BPH, anemia chronic disease, stage IV CKD, type II DM, depression, hypertension, history of GI bleed, paroxysmal atrial fibrillation, secondary hyper parathyroidism, thyroid disease, history of cigarette smoking who presented to the emergency department with urinary burning, dyspnea and progressively worse edema.  Work-up shows BNP of over 2000 pg/mL, worsening renal function and retroperitoneal mass.    Palliative care asked to get involved in the setting of worsening kidney function and large left retroperitoneal paraglioma.   This NP visited patient at the bedside as a follow up to for palliative medicine needs and emotional support, and to meet with family again today   for ongoing education regarding goals of care, treatment option decisions and advance care planning.  Today patient is more alert and oral intake has improved some according to family. I spoke with son and wife by telephone  Continued education offered on the complex current medical situation; chronic kidney disease stage IV, acute on chronic congestive heart failure, A-fib a flutter, paraganglioma, urinary retention, DM, HTN, anemia of chronic disease and overall failure to thrive.    Labs reviewed with family-- discussed today Catecholamines specific labs and recommended and initiated Doxazosin/alpa blocker on 12-15-21   I verbalized my concern for patient's overall continued failure to thrive and high risk for  decompensation.  Both patient's wife and son verbalized an understanding of the seriousness of the patient's medical situation, however they remain hopeful and are open to all offered and available medical interventions to prolong life.  Son is verbalizing request for consideration of direct transfer to [place] or [place].     Family encouraged to continue exploring with patient his personal goals of care and desire to seek aggressive life-prolonging measures.  Education offered today regarding  the importance of continued conversation with each other and the medical providers regarding overall plan of care and treatment options,  ensuring decisions are within the context of the patients values and GOCs.  Education offered on the difference between aggressive medical intervention path and a palliative comfort path for this, patient at this time in this situation.   Questions and concerns addressed   Discussed with Dr [name]   PMT will continue to support holistically   Wadie Lessen NP  Palliative Medicine Team Team Phone # [phone] Pager [phone]

## 2021-12-21 NOTE — Progress Notes (Signed)
Physical Therapy Treatment Patient Details Name: Adam Reid MRN: [id number] DOB: 1946-06-04 Today's Date: 12/21/2021   History of Present Illness The pt is a 76 yo male presenting 6/4 with SOB and hypoxia. Work up revealed elevated BNP, acute CHF exacerbation, and worsening renal function with retroperitoneal mass. PMH includes: CKD IV, CHF, DM II, hypoxic respiratory failure on 2-2.5L, HTN, PAF on Eliquis, and malnutrition.    PT Comments    Pt received supine and agreeable to session with good progress towards acute goals. Pt able to come to sitting EOB without assist and transfer to standing with min guard for safety. Pt demonstrating ambulation in hall without need for AD with no overt LOB with assist to steady intermittently with fatigue. Pt demonstrating most instability when turning in hall and in room to position self to come to sitting on EOB, pt with mild LOB but able to self correct in all instances. Pt with fair tolerance for LE exercise for increased strength and endurance. Pt continues to benefit from skilled PT services to progress toward functional mobility goals.    Recommendations for follow up therapy are one component of a multi-disciplinary discharge planning process, led by the attending physician.  Recommendations may be updated based on patient status, additional functional criteria and insurance authorization.  Follow Up Recommendations  No PT follow up     Assistance Recommended at Discharge PRN  Patient can return home with the following A little help with bathing/dressing/bathroom;Assistance with cooking/housework;Assist for transportation   Equipment Recommendations  None recommended by PT    Recommendations for Other Services       Precautions / Restrictions Precautions Precautions: Fall Precaution Comments: watch O2, on 3L this session Restrictions Weight Bearing Restrictions: No     Mobility  Bed Mobility Overal bed mobility: Independent              General bed mobility comments: No use of the rails, mildly increased time    Transfers Overall transfer level: Needs assistance Equipment used: None Transfers: Sit to/from Stand Sit to Stand: Min guard           General transfer comment: minG to supervision for safety within session, mild instability on initial stand    Ambulation/Gait Ambulation/Gait assistance: Min guard Gait Distance (Feet): 100 Feet Assistive device: None Gait Pattern/deviations: Step-through pattern, Decreased stride length Gait velocity: decreased     General Gait Details: slow with mild instability, staggering steps or sway but pt self-corrects without UE support. slowed   Marine scientist Rankin (Stroke Patients Only)       Balance Overall balance assessment: Needs assistance Sitting-balance support: Feet supported, No upper extremity supported Sitting balance-Leahy Scale: Normal     Standing balance support: No upper extremity supported, During functional activity Standing balance-Leahy Scale: Fair Standing balance comment: mild instability, no UE support                            Cognition Arousal/Alertness: Awake/alert Behavior During Therapy: WFL for tasks assessed/performed Overall Cognitive Status: Within Functional Limits for tasks assessed                                          Exercises General Exercises - Lower Extremity Long Arc Quad: Right,  Left, 10 reps, Seated Hip Flexion/Marching: Right, Left, 10 reps, Seated Other Exercises Other Exercises: supine bridge x10 Other Exercises: 5x STS in <30 seconds    General Comments General comments (skin integrity, edema, etc.): SpO2 to low of 87% on 3L, with PLB able to return >90%, 93% on 3L at rest, Farsi interpreter Ramin [phone] used      Pertinent Vitals/Pain      Home Living                          Prior Function             PT Goals (current goals can now be found in the care plan section) Acute Rehab PT Goals Patient Stated Goal: to go home again soon PT Goal Formulation: With patient Time For Goal Achievement: 12/31/21    Frequency    Min 3X/week      PT Plan      Co-evaluation              AM-PAC PT "6 Clicks" Mobility   Outcome Measure  Help needed turning from your back to your side while in a flat bed without using bedrails?: None Help needed moving from lying on your back to sitting on the side of a flat bed without using bedrails?: None Help needed moving to and from a bed to a chair (including a wheelchair)?: A Little Help needed standing up from a chair using your arms (e.g., wheelchair or bedside chair)?: A Little Help needed to walk in hospital room?: A Little Help needed climbing 3-5 steps with a railing? : A Little 6 Click Score: 20    End of Session Equipment Utilized During Treatment: Gait belt Activity Tolerance: Patient tolerated treatment well Patient left: in bed;with call bell/phone within reach Nurse Communication: Mobility status PT Visit Diagnosis: Muscle weakness (generalized) (M62.81)     Time: 4163-8453 PT Time Calculation (min) (ACUTE ONLY): 33 min  Charges:  $Gait Training: 8-22 mins $Therapeutic Exercise: 8-22 mins                     Anastasya Jewell R. PTA Acute Rehabilitation Services Office: [place] 12/21/2021, 4:28 PM

## 2021-12-21 NOTE — Progress Notes (Signed)
Patient's wife translated that patient would like rectal suppository now. Patient's abdominal is slightly distended and RN gave lactulose earlier in the shift. RN informed Public house manager. RN returned to beside after shift change to give rectal suppository at [place]. Patient states he feels fine, bed lowered to lowest position, and call bell is within reach. RN informed family to alert staff when patient has urge to BM.

## 2021-12-21 NOTE — Progress Notes (Signed)
PROGRESS NOTE    Adam Reid  JGO:[id number] DOB: [birth year] DOA: 12/13/2021 PCP: [name], MD  76 y.o. 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 CT demonstrated enlargement of retroperitoneal mass. The patient was found to be retaining urine, so foley catheter was placed and he was admitted for AKI on stage IV CKD with nephrology, cardiology, and palliative care consulted. Patient requested discontinuation of foley but had recurrent retention, so coude catheter was placed 6/6.  -Seen by oncology, recommended evaluation by surgical oncology -Overall prognosis felt to be poor, s/p Palliative care meeting with patient and family 6/6 with determination that the patient would like no restrictions to medical care,  hope to pursue measures with curative intent.  -Dr.Grunz discussed in detail with endocrinology consultant and surgical oncology at [facility] on 6/6 who recommended the work up as ordered and follow up after discharge. Did not recommend inpatient transfer for management of paraganglioma.  -6/8: -> DNR   Subjective: -Urinary, catheter related discomfort is improving, denies any dyspnea, has intermittent nausea and anorexia  Assessment and Plan:  Acute on chronic HFpEF: Echo shows LVEF 55-60%, mild basal-septal LVH and grade III diastolic dysfunction, elevated PASP and RV enlargement, IVC dilated. Moderate MR. -Diuresed with IV Lasix initially, held for last 4+ days, creatinine stable around 4.9, BUN is 99, anticipate need to resume diuretics in 1 to 2 days -Continues to have fair urine output, frequent hospitalizations with fluid overload in the last few months -GDMT limited by CKD -Cardiology signed off, not much further to offer   AKI on stage IV CKD:   -Secondary to cardiorenal syndrome and urinary retention -Now back with a coud catheter for recurrent retention -Nephrology following, diuresed initially, subsequently diuretics on hold, BUN and creatinine with mild worsening now stable, has some marginal uremic symptoms as well, urine output is still fair, anticipate need for dialysis in the near future, palliative care following as well -Overall prognosis is poor, palliative care discussed with patients family again 6/11, he has deferred all decisions to his wife and son, they wish to continue full scope of Rx including hemodialysis, now DNR  acute urinary retention: BPH noted on prior imaging -On admission and required Foley catheter for retention and AKI , patient requested discontinuation of foley due to pain despite lidocaine and oral medications.  -Developed recurrent retention,  coud catheter was placed on 6/6  -Now started on Cardura in the setting of paraganglioma as well  -6/10 AM with dysuria, coud catheter changed 6/10, symptoms improving, UA with scant hematuria-no s/s of infection  Retroperitoneal paraganglioma: Enlarging. 6.7 x 5.4 cm less than a month ago, now 6.9 x 6.1 x 7.9 cm. Location abuts the infrarenal aorta.  - D/w oncology who recommended endocrinology and surgical evaluation. Surgery locally was contacted and deferred to tertiary care centers.  Dr. Bonner Puna discussed with Dr. Cam Hai who is an endocrine surgeon at [facility]. He recommended starting doxazosin and continuing this until follow up. He will have his office reach out to patient's wife to schedule follow up next week. Of note, the enlargement seen on repeat CT may be related to bleeding from biopsy and not true tumor proliferation. Dr.Randle specifically stated he would not recommend any surgical intervention at this time, so  transfer for this reason would be futile.  -Dr. Bonner Puna also discussed with endocrinology, Dr. Buddy Duty, who confirmed work up with urine  metanephrines, urine catecholamines, and plasma metanephrines is the appropriate next step in hopes of determining whether this is functioning vs. not hormonally actively functioning tumor.  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  - No ESA with malignancy. Of note, had an appointment scheduled as outpatient for feraheme and retacrit on 6/7. -Given IV iron 6/6, 6/7 -Hemoglobin down to 7.4 after hydration yesterday suspect dilutional component, will repeat IV iron 6/11-12   T2DM:  -CBGs now stable - Very sensitive SSI given his malnutrition and renal impairment.  - Continue formulary -gliptin, hold [facility]   Constipation -Repeat Senokot, MiraLAX, lactulose and Dulcolax suppository -Very small BM yesterday   Moderate protein calorie malnutrition: Body mass index is 18.54 kg/m. Suspect significant catabolic state given enlarging mass with central necrosis.  -Protein supplements   PAF/flutter: Currently in NSR. Last TSH 4.9. - Continue eliquis - Continue diltiazem, amiodarone   CAD, aortic atherosclerosis: - Continue statin, anticoagulation for now.    Depression: Quiescent - Continue SSRI  Goals of care discussions Overall prognosis is felt to be poor in the setting of CHF, CKD4-5, severe malnutrition, large retroperitoneal paraganglioma, ongoing failure to thrive.  Discussed poor prognosis with the patient again 6/7 and 6/8 -Seen by palliative care 6/6, 6/7: Poor prognosis discussed ,  -I called and discussed with wife 6/8, she said that spouse and family is making decisions on his behalf, I recommended DNR, she was agreeable, CODE STATUS changed,     DVT prophylaxis: Apixaban Code Status: DNR, after d/w wife Family Communication: Updated son at bedside today and wife  yesterday Disposition Plan: To be determined  Consultants: Nephrology, cardiology   Procedures:   Antimicrobials:    Objective: Vitals:   12/20/21 2121 12/21/21 0427 12/21/21 0926 12/21/21 1056  BP: (!) 113/59 114/60    Pulse: 61 60    Resp: '14 13  18  '$ Temp: 98.5 F (36.9 C) 98.5 F (36.9 C)  98.7 F (37.1 C)  TempSrc: Oral Oral  Oral  SpO2: 94% 97% 92% 91%  Weight:  53.1 kg    Height:        Intake/Output Summary (Last 24 hours) at 12/21/2021 1144 Last data filed at 12/20/2021 2300 Gross per 24 hour  Intake 390 ml  Output 601 ml  Net -211 ml   Filed Weights   12/19/21 0515 12/20/21 0358 12/21/21 0427  Weight: 51.7 kg 51.6 kg 53.1 kg    Examination:  General exam: Frail elderly male sitting up in bed, AAOx3, no distress HEENT: Positive JVD CVS: S1-S2, regular rhythm, systolic murmur Lungs: Few basilar rales Abdomen: Soft, nontender, bowel sounds present Extremities: No edema  Skin: No rashes Psychiatry:  Mood & affect appropriate.     Data Reviewed:   CBC: Recent Labs  Lab 12/17/21 0402 12/18/21 0349 12/19/21 0229 12/20/21 0350 12/21/21 0325  WBC 10.8* 9.7 12.2* 9.2 11.0*  HGB 7.7* 7.8* 8.0* 7.4* 7.6*  HCT 23.5* 23.5* 24.3* 23.7* 24.5*  MCV 86.4 86.1 86.2 87.1 87.8  PLT 276 264 272 271 756   Basic Metabolic Panel: Recent Labs  Lab 12/17/21 0402 12/18/21 0349 12/19/21 0229 12/20/21 0350 12/21/21 0325  NA 132* 132* 131* 134* 133*  K 4.4 3.8 4.4 3.7 4.8  CL 96* 95* 92* 95* 94*  CO2 '22 25 25 26 25  '$ GLUCOSE 153* 112* 139* 148* 138*  BUN 108* 106* 103* 97* 99*  CREATININE 5.02* 5.04* 5.03* 4.72* 4.92*  CALCIUM 8.4* 8.3* 8.5* 8.6* 8.4*  PHOS 8.0* 7.5* 7.0* 6.5* 7.6*   GFR: Estimated Creatinine Clearance: 9.9 mL/min (A) (by C-G formula based on SCr of 4.92 mg/dL (H)). Liver Function Tests: Recent Labs  Lab 12/17/21 0402 12/18/21 0349 12/19/21 0229 12/20/21 0350 12/21/21 0325  ALBUMIN 2.8* 2.9* 3.0* 2.8* 2.9*   No results for  input(s): "LIPASE", "AMYLASE" in the last 168 hours. No results for input(s): "AMMONIA" in the last 168 hours. Coagulation Profile: No results for input(s): "INR", "PROTIME" in the last 168 hours. Cardiac Enzymes: No results for input(s): "CKTOTAL", "CKMB", "CKMBINDEX", "TROPONINI" in the last 168 hours.  BNP (last 3 results) Recent Labs    12/10/21 1339  PROBNP 12,645*   HbA1C: No results for input(s): "HGBA1C" in the last 72 hours. CBG: Recent Labs  Lab 12/20/21 1046 12/20/21 1701 12/20/21 2123 12/21/21 0554 12/21/21 1108  GLUCAP 182* 217* 230* 175* 208*   Lipid Profile: No results for input(s): "CHOL", "HDL", "LDLCALC", "TRIG", "CHOLHDL", "LDLDIRECT" in the last 72 hours. Thyroid Function Tests: No results for input(s): "TSH", "T4TOTAL", "FREET4", "T3FREE", "THYROIDAB" in the last 72 hours. Anemia Panel: No results for input(s): "VITAMINB12", "FOLATE", "FERRITIN", "TIBC", "IRON", "RETICCTPCT" in the last 72 hours. Urine analysis:    Component Value Date/Time   COLORURINE YELLOW 12/19/2021 1423   APPEARANCEUR HAZY (A) 12/19/2021 1423   LABSPEC 1.011 12/19/2021 1423   PHURINE 6.0 12/19/2021 1423   GLUCOSEU NEGATIVE 12/19/2021 1423   HGBUR LARGE (A) 12/19/2021 1423   BILIRUBINUR NEGATIVE 12/19/2021 1423   BILIRUBINUR negative 03/02/2021 1458   BILIRUBINUR neg 03/23/2015 1404   KETONESUR NEGATIVE 12/19/2021 1423   PROTEINUR 100 (A) 12/19/2021 1423   UROBILINOGEN 0.2 03/02/2021 1458   NITRITE NEGATIVE 12/19/2021 1423   LEUKOCYTESUR TRACE (A) 12/19/2021 1423   Sepsis Labs: '@LABRCNTIP'$ (procalcitonin:4,lacticidven:4)  ) Recent Results (from the past 240 hour(s))  Urine Culture     Status: None   Collection Time: 12/14/21  6:41 AM   Specimen: In/Out Cath Urine  Result Value Ref Range Status   Specimen Description IN/OUT CATH URINE  Final   Special Requests NONE  Final   Culture   Final    NO GROWTH Performed at [facility], [place] [address]  [place], [place] [address]    Report Status 12/15/2021 FINAL  Final  Urine Culture     Status: None   Collection Time: 12/19/21  7:22 AM   Specimen: Urine, Catheterized  Result Value Ref Range Status   Specimen Description URINE, CATHETERIZED  Final   Special Requests NONE  Final   Culture   Final    NO GROWTH Performed at [facility] [address] [place], [place] [address]    Report Status 12/20/2021 FINAL  Final     Radiology Studies: No results found.   Scheduled Meds:  amiodarone  200 mg Oral Daily   apixaban  2.5 mg Oral BID   atorvastatin  80 mg Oral Daily   bisacodyl  10 mg Rectal Once   calcium acetate  667 mg Oral TID with meals   Chlorhexidine Gluconate Cloth  6 each Topical Daily   citalopram  20 mg Oral Daily   diltiazem  180 mg Oral Daily   doxazosin  4 mg Oral Daily  finasteride  5 mg Oral Daily    HYDROmorphone (DILAUDID) injection  0.5 mg Intravenous Once   insulin aspart  0-6 Units Subcutaneous TID WC   lactulose  20 g Oral BID   linagliptin  5 mg Oral Daily   metolazone  5 mg Oral Daily   polyethylene glycol  17 g Oral Daily   senna-docusate  1 tablet Oral BID   sodium bicarbonate  650 mg Oral BID   sodium chloride flush  3 mL Intravenous Q12H   Continuous Infusions:  sodium chloride     ferric gluconate (FERRLECIT) IVPB 250 mg (12/20/21 1205)     LOS: 8 days    Time spent: 77mn   PDomenic Polite MD [facility]   12/21/2021, 11:44 AM

## 2021-12-21 NOTE — Progress Notes (Signed)
PT Cancellation Note  Patient Details Name: Adam Reid MRN: [id number] DOB: 31-May-1946   Cancelled Treatment:    Reason Eval/Treat Not Completed: Fatigue/lethargy limiting ability to participate, pt reporting too tired from medication to participate, asking this PTA to come back after he has chance to rest. Will check back as schedule allows to continue with PT POC.    Betsey Holiday Ameer Sanden 12/21/2021, 1:41 PM

## 2021-12-22 DIAGNOSIS — N179 Acute kidney failure, unspecified: Secondary | ICD-10-CM | POA: Diagnosis not present

## 2021-12-22 DIAGNOSIS — R339 Retention of urine, unspecified: Secondary | ICD-10-CM | POA: Diagnosis not present

## 2021-12-22 DIAGNOSIS — Z515 Encounter for palliative care: Secondary | ICD-10-CM | POA: Diagnosis not present

## 2021-12-22 DIAGNOSIS — I130 Hypertensive heart and chronic kidney disease with heart failure and stage 1 through stage 4 chronic kidney disease, or unspecified chronic kidney disease: Secondary | ICD-10-CM | POA: Diagnosis not present

## 2021-12-22 DIAGNOSIS — D447 Neoplasm of uncertain behavior of aortic body and other paraganglia: Secondary | ICD-10-CM | POA: Diagnosis not present

## 2021-12-22 DIAGNOSIS — I509 Heart failure, unspecified: Secondary | ICD-10-CM | POA: Diagnosis not present

## 2021-12-22 LAB — RENAL FUNCTION PANEL
Albumin: 2.9 g/dL — ABNORMAL LOW (ref 3.5–5.0)
Anion gap: 13 (ref 5–15)
BUN: 92 mg/dL — ABNORMAL HIGH (ref 8–23)
CO2: 27 mmol/L (ref 22–32)
Calcium: 8.4 mg/dL — ABNORMAL LOW (ref 8.9–10.3)
Chloride: 94 mmol/L — ABNORMAL LOW (ref 98–111)
Creatinine, Ser: 4.71 mg/dL — ABNORMAL HIGH (ref 0.61–1.24)
GFR, Estimated: 12 mL/min — ABNORMAL LOW (ref >60)
Glucose, Bld: 144 mg/dL — ABNORMAL HIGH (ref 70–99)
Phosphorus: 6.6 mg/dL — ABNORMAL HIGH (ref 2.5–4.6)
Potassium: 4.3 mmol/L (ref 3.5–5.1)
Sodium: 134 mmol/L — ABNORMAL LOW (ref 135–145)

## 2021-12-22 LAB — GLUCOSE, CAPILLARY
Glucose-Capillary: 122 mg/dL — ABNORMAL HIGH (ref 70–99)
Glucose-Capillary: 230 mg/dL — ABNORMAL HIGH (ref 70–99)
Glucose-Capillary: 210 mg/dL — ABNORMAL HIGH (ref 70–99)
Glucose-Capillary: 257 mg/dL — ABNORMAL HIGH (ref 70–99)

## 2021-12-22 LAB — METANEPHRINES, URINE, 24 HOUR
Metaneph Total, Ur: 705 ug/L
Metanephrines, 24H Ur: 1763 ug/24 hr — ABNORMAL HIGH (ref 58–276)
Normetanephrine, 24H Ur: 20440 ug/24 hr — ABNORMAL HIGH (ref 156–729)
Normetanephrine, Ur: 8176 ug/L
Total Volume: 2500

## 2021-12-22 LAB — CBC
HCT: 23.4 % — ABNORMAL LOW (ref 39.0–52.0)
Hemoglobin: 7.4 g/dL — ABNORMAL LOW (ref 13.0–17.0)
MCH: 27.9 pg (ref 26.0–34.0)
MCHC: 31.6 g/dL (ref 30.0–36.0)
MCV: 88.3 fL (ref 80.0–100.0)
Platelets: 254 10*3/uL (ref 150–400)
RBC: 2.65 MIL/uL — ABNORMAL LOW (ref 4.22–5.81)
RDW: 18.2 % — ABNORMAL HIGH (ref 11.5–15.5)
WBC: 12.3 10*3/uL — ABNORMAL HIGH (ref 4.0–10.5)
nRBC: 0 % (ref 0.0–0.2)

## 2021-12-22 MED ORDER — HYDROCODONE-ACETAMINOPHEN 5-325 MG PO TABS
1.0000 | ORAL_TABLET | Freq: Four times a day (QID) | ORAL | Status: DC | PRN
  Administered 2021-12-22 – 2021-12-23 (×2): 1 via ORAL
  Filled 2021-12-22 (×2): qty 1

## 2021-12-22 NOTE — Progress Notes (Signed)
Patient ID: Adam Reid, male   DOB: October 16, 1945, 76 y.o.   MRN: [id number]  Assessment/Plan:  AKI/CKD stage IV - presumably combination of urinary retention and cardiorenal syndrome in the setting of acute on chronic CHF.  BUN/Cr have reached a plateau.  Good UOP after foley catheter and stopping IV lasix.  No indication for hemodialysis, however we may need to discuss the possible need for HD if his renal function continues to worsen.  He is a marginal candidate for long-term dialysis given all of his co-morbidities.  He normally follows with Dr. [name] at [facility] and will need hospital follow up after discharge.  Avoid nephrotoxic agents such as IV contrast, NSAIDs, phosphorous containing bowel preps.  Renal dose meds for GFR <15. Continue to follow UOP and daily Scr.  Stopped lasix 80 mg IV daily due to rise in Scr on 12/16/21 but he remains negative with I&O's. May need to restart in future but would continue for hold for now (1463m/24hrs without lasix) He still remains 6.8  liters negative since admission. Continued discussing with the son (optometrist in [place]; pt deferring his care to his son and wife. He prefers not to do dialysis but is open to it in the future; at this point I would just use a TC if needed for HD in future given very guarded prognosis with the paraganglioma. Fortunately renal function fairly stable and great UOP; no indication for initiation of RRT.   Acute on chronic diastolic CHF - improved.  Palliative care following for worsening cardiac function and FTT.  He is now DNR P. 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  Surgery recommends transfer to tertiary care center given high risk.  Declined for transfer but recommended to start alpha blocking agent.  Will need outpatient follow up. Urinary retention - foley replaced.  Predated hospitalization.  DM type 2 - per primary HTN - stable Anemia  of CKD stage IV - follow H/H.  Hold off on ESA given recent diagnosis of paraganglioma.  Will defer management to Oncology.  Given IV iron.  Transfuse for Hgb <7.    S: Dysuria continues but otherwise no new complaints. Son bedside and I answered all his questions. Great UOP over night.  O:BP 130/64   Pulse 79   Temp 97.7 F (36.5 C)   Resp 16   Ht '5\' 6"'$  (1.676 m)   Wt 53.4 kg   SpO2 95%   BMI 19.01 kg/m   Intake/Output Summary (Last 24 hours) at 12/22/2021 1138 Last data filed at 12/22/2021 0522 Gross per 24 hour  Intake 10 ml  Output 1400 ml  Net -1390 ml   Intake/Output: I/O last 3 completed shifts: In: 300 [P.O.:300] Out: 1400 [Urine:1400]  Intake/Output this shift:  No intake/output data recorded. Weight change: 0.318 kg Gen:NAD CVS: RRR Resp:CTA Abd: +BS, soft, NT/ND Ext: no edema GU: foley to gravity  Recent Labs  Lab 12/16/21 0506 12/17/21 0402 12/18/21 0349 12/19/21 0229 12/20/21 0350 12/21/21 0325 12/22/21 0306  NA 131* 132* 132* 131* 134* 133* 134*  K 3.8 4.4 3.8 4.4 3.7 4.8 4.3  CL 92* 96* 95* 92* 95* 94* 94*  CO2 '22 22 25 25 26 25 27  '$ GLUCOSE 117* 153* 112* 139* 148* 138* 144*  BUN 107* 108* 106* 103* 97* 99* 92*  CREATININE 4.97* 5.02* 5.04* 5.03* 4.72* 4.92* 4.71*  ALBUMIN  --  2.8* 2.9* 3.0* 2.8* 2.9* 2.9*  CALCIUM 8.4* 8.4* 8.3* 8.5* 8.6* 8.4* 8.4*  PHOS  --  8.0* 7.5* 7.0* 6.5* 7.6* 6.6*   Liver Function Tests: Recent Labs  Lab 12/20/21 0350 12/21/21 0325 12/22/21 0306  ALBUMIN 2.8* 2.9* 2.9*   No results for input(s): "LIPASE", "AMYLASE" in the last 168 hours. No results for input(s): "AMMONIA" in the last 168 hours. CBC: Recent Labs  Lab 12/18/21 0349 12/19/21 0229 12/20/21 0350 12/21/21 0325 12/22/21 0306  WBC 9.7 12.2* 9.2 11.0* 12.3*  HGB 7.8* 8.0* 7.4* 7.6* 7.4*  HCT 23.5* 24.3* 23.7* 24.5* 23.4*  MCV 86.1 86.2 87.1 87.8 88.3  PLT 264 272 271 264 254   Cardiac Enzymes: No results for input(s): "CKTOTAL", "CKMB",  "CKMBINDEX", "TROPONINI" in the last 168 hours.  CBG: Recent Labs  Lab 12/21/21 1108 12/21/21 1613 12/21/21 2223 12/22/21 0617 12/22/21 1112  GLUCAP 208* 83 173* 122* 230*    Iron Studies: No results for input(s): "IRON", "TIBC", "TRANSFERRIN", "FERRITIN" in the last 72 hours. Studies/Results: No results found.  amiodarone  200 mg Oral Daily   apixaban  2.5 mg Oral BID   atorvastatin  80 mg Oral Daily   calcium acetate  667 mg Oral TID with meals   Chlorhexidine Gluconate Cloth  6 each Topical Daily   citalopram  20 mg Oral Daily   diltiazem  180 mg Oral Daily   doxazosin  4 mg Oral Daily   finasteride  5 mg Oral Daily    HYDROmorphone (DILAUDID) injection  0.5 mg Intravenous Once   insulin aspart  0-6 Units Subcutaneous TID WC   lactulose  20 g Oral BID   linagliptin  5 mg Oral Daily   polyethylene glycol  17 g Oral Daily   senna-docusate  1 tablet Oral BID   sodium bicarbonate  650 mg Oral BID   sodium chloride flush  3 mL Intravenous Q12H    BMET    Component Value Date/Time   NA 134 (L) 12/22/2021 0306   NA 137 06/19/2020 0936   K 4.3 12/22/2021 0306   CL 94 (L) 12/22/2021 0306   CO2 27 12/22/2021 0306   GLUCOSE 144 (H) 12/22/2021 0306   BUN 92 (H) 12/22/2021 0306   BUN 51 (H) 06/19/2020 0936   CREATININE 4.71 (H) 12/22/2021 0306   CREATININE 2.68 (H) 03/26/2016 1839   CALCIUM 8.4 (L) 12/22/2021 0306   GFRNONAA 12 (L) 12/22/2021 0306   GFRAA 21 (L) 06/19/2020 0936   CBC    Component Value Date/Time   WBC 12.3 (H) 12/22/2021 0306   RBC 2.65 (L) 12/22/2021 0306   HGB 7.4 (L) 12/22/2021 0306   HGB 9.1 (L) 09/14/2019 1555   HCT 23.4 (L) 12/22/2021 0306   HCT 28.4 (L) 09/14/2019 1555   PLT 254 12/22/2021 0306   PLT 464 (H) 09/14/2019 1555   MCV 88.3 12/22/2021 0306   MCV 82 09/14/2019 1555   MCH 27.9 12/22/2021 0306   MCHC 31.6 12/22/2021 0306   RDW 18.2 (H) 12/22/2021 0306   RDW 16.4 (H) 09/14/2019 1555   LYMPHSABS 0.4 (L) 12/13/2021 1011    LYMPHSABS 0.6 (L) 10/05/2017 1433   MONOABS 0.5 12/13/2021 1011   EOSABS 0.1 12/13/2021 1011   EOSABS 0.1 10/05/2017 1433   BASOSABS 0.1 12/13/2021 1011   BASOSABS 0.0 10/05/2017 1433

## 2021-12-22 NOTE — Progress Notes (Addendum)
PROGRESS NOTE    Adam Reid  MGQ:[id number] DOB: 1946-01-04 DOA: 12/13/2021 PCP: [name], MD  76 y.o. 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 CT demonstrated enlargement of retroperitoneal mass. The patient was found to be retaining urine, so foley catheter was placed and he was admitted for AKI on stage IV CKD with nephrology, cardiology, and palliative care consulted. Patient requested discontinuation of foley but had recurrent retention, so coude catheter was placed 6/6.  -Seen by oncology, recommended evaluation by surgical oncology -Overall prognosis felt to be poor, s/p Palliative care meeting with patient and family 6/6 with determination that the patient would like no restrictions to medical care,  hope to pursue measures with curative intent.  -Dr.Grunz discussed in detail with endocrinology consultant and surgical oncology at [facility] on 6/6 who recommended the work up as ordered and follow up after discharge. Did not recommend inpatient transfer for management of paraganglioma.  -6/8: -> DNR   Subjective: -bladder discomfort improving now 1/10, eating breakfast otherwise po intake is poor, denies any dyspnea -Son is requesting that I call [facility] for transfer today since he was declined by [facility] for inpatient transfer  Assessment and Plan:  Acute on chronic HFpEF: Echo shows LVEF 55-60%, mild basal-septal LVH and grade III diastolic dysfunction, elevated PASP and RV enlargement, IVC dilated. Moderate MR. -Diuresed with IV Lasix initially, held for last 4+ days, creatinine stable around 4.7-4.9, BUN is 99, anticipate need to resume diuretics in 1 to 2 days -Continues to have fair urine output, frequent hospitalizations with fluid overload in the  last few months -GDMT limited by CKD -Cardiology signed off, not much further to offer   AKI on stage IV CKD:  -Secondary to cardiorenal syndrome and urinary retention -Now back with a coud catheter for recurrent retention -Nephrology following, diuresed initially, subsequently diuretics on hold, BUN and creatinine with mild worsening now stable, has some marginal uremic symptoms as well, urine output is still fair, anticipate need for dialysis in the near future, palliative care following as well, patient reportedly now does not want to do dialysis at this time but seems open to this in the future, advised son to kindly clarify this -Overall prognosis is poor, palliative care discussed with patients family again 6/11, he has deferred all decisions to his wife and son, now DNR -Per nephrology  acute urinary retention: BPH noted on prior imaging -On admission and required Foley catheter for retention and AKI , patient requested discontinuation of foley due to pain despite lidocaine and oral medications.  -Developed recurrent retention,  coud catheter was placed on 6/6  -Now started on Cardura in the setting of paraganglioma as well  -6/10 AM with dysuria, coud catheter changed 6/10, symptoms improving, UA with scant hematuria-no s/s of infection  Retroperitoneal paraganglioma: Enlarging. 6.7 x 5.4 cm less than a month ago, now 6.9 x 6.1 x 7.9 cm. Location abuts the infrarenal aorta.  - D/w oncology who recommended endocrinology and surgical evaluation. Surgery locally was contacted and deferred to tertiary care centers.  Dr. Bonner Puna discussed with Dr. Cam Hai who is an endocrine surgeon at [place]. He recommended starting doxazosin and continuing this until follow up. He will have his office reach out to patient's wife to  schedule follow up next week. Of note, the enlargement seen on repeat CT may be related to bleeding from biopsy and not true tumor proliferation. Dr.Randle specifically  stated he would not recommend any surgical intervention at this time, so transfer for this reason would be futile.  -Dr. Bonner Puna also discussed with endocrinology, Dr. Buddy Duty, who confirmed work up with urine metanephrines, urine catecholamines, and plasma metanephrines is the appropriate next step in hopes of determining whether this is functioning vs. not hormonally actively functioning tumor.  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  - No ESA with malignancy. Of note, had an appointment scheduled as outpatient for feraheme and retacrit on 6/7. -Given IV iron 6/6, 6/7 -Hemoglobin stable in the 7 range, given repeat IV iron 6/11-12   T2DM:  -CBGs now stable - Very sensitive SSI given his malnutrition and renal impairment.  - Continue formulary -gliptin, hold [facility]   Constipation -Had a bowel movement yesterday, continue Senokot and MiraLAX today   Moderate protein calorie malnutrition: Body mass index is 18.54 kg/m. Suspect significant catabolic state given enlarging mass with central necrosis.  -Protein supplements   PAF/flutter: Currently in NSR. Last TSH 4.9. - Continue eliquis - Continue diltiazem, amiodarone   CAD, aortic atherosclerosis: - Continue statin, anticoagulation for now.    Depression: Quiescent - Continue SSRI  Goals of care discussions Overall prognosis is felt to be poor in the setting of CHF, CKD4-5, severe malnutrition, large retroperitoneal  paraganglioma, ongoing failure to thrive.  Discussed poor prognosis with the patient again 6/7 and 6/8 -Seen by palliative care 6/6, 6/7: Poor prognosis discussed ,  -I called and discussed with wife 6/8, she said that spouse and family is making decisions on his behalf, I recommended DNR, she was agreeable, CODE STATUS changed, palliative care continues to follow    DVT prophylaxis: Apixaban Code Status: DNR, after d/w wife Family Communication: Updated son at bedside today Disposition Plan: To be determined  Consultants: Nephrology, cardiology   Procedures:   Antimicrobials:    Objective: Vitals:   12/21/21 2108 12/22/21 0521 12/22/21 0830 12/22/21 1114  BP: (!) 126/59 119/68 122/65 130/64  Pulse: 75 77 76 79  Resp: '16 16 18 16  '$ Temp: 98.4 F (36.9 C) 97.8 F (36.6 C) 98 F (36.7 C) 97.7 F (36.5 C)  TempSrc: Oral Oral Oral   SpO2: 94% 96% 98% 95%  Weight:  53.4 kg    Height:        Intake/Output Summary (Last 24 hours) at 12/22/2021 1211 Last data filed at 12/22/2021 0522 Gross per 24 hour  Intake 10 ml  Output 1400 ml  Net -1390 ml   Filed Weights   12/20/21 0358 12/21/21 0427 12/22/21 0521  Weight: 51.6 kg 53.1 kg 53.4 kg    Examination:  General exam: Frail chronically ill elderly male sitting up in bed, AAO x2, no distress HEENT: Positive JVD CVS: S1-S2, regular rhythm, systolic murmur Lungs: Few basilar rales Abdomen: Soft, nontender, bowel sounds present Extremities: No edema Skin: No rashes Psychiatry:  Mood & affect  appropriate.     Data Reviewed:   CBC: Recent Labs  Lab 12/18/21 0349 12/19/21 0229 12/20/21 0350 12/21/21 0325 12/22/21 0306  WBC 9.7 12.2* 9.2 11.0* 12.3*  HGB 7.8* 8.0* 7.4* 7.6* 7.4*  HCT 23.5* 24.3* 23.7* 24.5* 23.4*  MCV 86.1 86.2 87.1 87.8 88.3  PLT 264 272 271 264 546   Basic Metabolic Panel: Recent Labs  Lab 12/18/21 0349 12/19/21 0229 12/20/21 0350 12/21/21 0325 12/22/21 0306  NA 132* 131* 134* 133*  134*  K 3.8 4.4 3.7 4.8 4.3  CL 95* 92* 95* 94* 94*  CO2 '25 25 26 25 27  '$ GLUCOSE 112* 139* 148* 138* 144*  BUN 106* 103* 97* 99* 92*  CREATININE 5.04* 5.03* 4.72* 4.92* 4.71*  CALCIUM 8.3* 8.5* 8.6* 8.4* 8.4*  PHOS 7.5* 7.0* 6.5* 7.6* 6.6*   GFR: Estimated Creatinine Clearance: 10.4 mL/min (A) (by C-G formula based on SCr of 4.71 mg/dL (H)). Liver Function Tests: Recent Labs  Lab 12/18/21 0349 12/19/21 0229 12/20/21 0350 12/21/21 0325 12/22/21 0306  ALBUMIN 2.9* 3.0* 2.8* 2.9* 2.9*   No results for input(s): "LIPASE", "AMYLASE" in the last 168 hours. No results for input(s): "AMMONIA" in the last 168 hours. Coagulation Profile: No results for input(s): "INR", "PROTIME" in the last 168 hours. Cardiac Enzymes: No results for input(s): "CKTOTAL", "CKMB", "CKMBINDEX", "TROPONINI" in the last 168 hours.  BNP (last 3 results) Recent Labs    12/10/21 1339  PROBNP 12,645*   HbA1C: No results for input(s): "HGBA1C" in the last 72 hours. CBG: Recent Labs  Lab 12/21/21 1108 12/21/21 1613 12/21/21 2223 12/22/21 0617 12/22/21 1112  GLUCAP 208* 83 173* 122* 230*   Lipid Profile: No results for input(s): "CHOL", "HDL", "LDLCALC", "TRIG", "CHOLHDL", "LDLDIRECT" in the last 72 hours. Thyroid Function Tests: No results for input(s): "TSH", "T4TOTAL", "FREET4", "T3FREE", "THYROIDAB" in the last 72 hours. Anemia Panel: No results for input(s): "VITAMINB12", "FOLATE", "FERRITIN", "TIBC", "IRON", "RETICCTPCT" in the last 72 hours. Urine analysis:    Component Value Date/Time   COLORURINE YELLOW 12/19/2021 1423   APPEARANCEUR HAZY (A) 12/19/2021 1423   LABSPEC 1.011 12/19/2021 1423   PHURINE 6.0 12/19/2021 1423   GLUCOSEU NEGATIVE 12/19/2021 1423   HGBUR LARGE (A) 12/19/2021 1423   BILIRUBINUR NEGATIVE 12/19/2021 1423   BILIRUBINUR negative 03/02/2021 1458   BILIRUBINUR neg 03/23/2015 1404   KETONESUR NEGATIVE 12/19/2021 1423   PROTEINUR 100 (A) 12/19/2021 1423    UROBILINOGEN 0.2 03/02/2021 1458   NITRITE NEGATIVE 12/19/2021 1423   LEUKOCYTESUR TRACE (A) 12/19/2021 1423   Sepsis Labs: '@LABRCNTIP'$ (procalcitonin:4,lacticidven:4)  ) Recent Results (from the past 240 hour(s))  Urine Culture     Status: None   Collection Time: 12/14/21  6:41 AM   Specimen: In/Out Cath Urine  Result Value Ref Range Status   Specimen Description IN/OUT CATH URINE  Final   Special Requests NONE  Final   Culture   Final    NO GROWTH Performed at [facility], [place] [address] [place], [place] [address]    Report Status 12/15/2021 FINAL  Final  Urine Culture     Status: None   Collection Time: 12/19/21  7:22 AM   Specimen: Urine, Catheterized  Result Value Ref Range Status   Specimen Description URINE, CATHETERIZED  Final   Special Requests NONE  Final   Culture   Final    NO GROWTH Performed at [place] [address] [place],  [address]    Report Status  12/20/2021 FINAL  Final     Radiology Studies: No results found.   Scheduled Meds:  amiodarone  200 mg Oral Daily   apixaban  2.5 mg Oral BID   atorvastatin  80 mg Oral Daily   calcium acetate  667 mg Oral TID with meals   Chlorhexidine Gluconate Cloth  6 each Topical Daily   citalopram  20 mg Oral Daily   diltiazem  180 mg Oral Daily   doxazosin  4 mg Oral Daily   finasteride  5 mg Oral Daily    HYDROmorphone (DILAUDID) injection  0.5 mg Intravenous Once   insulin aspart  0-6 Units Subcutaneous TID WC   lactulose  20 g Oral BID   linagliptin  5 mg Oral Daily   polyethylene glycol  17 g Oral Daily   senna-docusate  1 tablet Oral BID   sodium bicarbonate  650 mg Oral BID   sodium chloride flush  3 mL Intravenous Q12H   Continuous Infusions:  sodium chloride       LOS: 9 days    Time spent: 65mn   PDomenic Polite MD [facility]   12/22/2021, 12:11 PM

## 2021-12-22 NOTE — TOC Progression Note (Signed)
Transition of Care [facility]) - Progression Note    Patient Details  Name: Adam Reid MRN: [id number] Date of Birth: 08/24/1945  Transition of Care [facility]) CM/SW Contact  Zenon Mayo, RN Phone Number: 12/22/2021, 4:19 PM  Clinical Narrative:    sob, retroperitoneal glioma, oncology following,  patient wants everything to be done. TOC will continue to follow for dc needs.     Expected Discharge Plan: Home/Self Care Barriers to Discharge: Continued Medical Work up  Expected Discharge Plan and Services Expected Discharge Plan: Home/Self Care   Discharge Planning Services: CM Consult   Living arrangements for the past 2 months: Single Family Home                   DME Agency: NA       HH Arranged: NA           Social Determinants of Health (SDOH) Interventions    Readmission Risk Interventions    12/15/2021    9:03 AM 11/16/2021    3:15 PM  Readmission Risk Prevention Plan  Transportation Screening Complete Complete  PCP or Specialist Appt within 3-5 Days Complete Complete  HRI or Home Care Consult Complete Complete  Social Work Consult for [place] Planning/Counseling Complete Complete  Palliative Care Screening Not Applicable Not Applicable  Medication Review [employer]) Complete Complete

## 2021-12-22 NOTE — Progress Notes (Signed)
Patient ID: Adam Reid, male   DOB: Dec 20, 1945, 76 y.o.   MRN: [id number]    Progress Note from the Palliative Medicine Team at [facility]   Patient Name: Adam Reid        Date: 12/22/2021 DOB: [birth year]  Age: 76 y.o. MRN#: [id number] Attending Physician: Domenic Polite, MD Primary Care Physician: [name], MD Admit Date: 12/13/2021   Medical records reviewed ,  discussed case with attending  Per intake H&P -->  76 year old male with a past medical history of chronic diastolic CHF, BPH, anemia chronic disease, stage IV CKD, type II DM, depression, hypertension, history of GI bleed, paroxysmal atrial fibrillation, secondary hyper parathyroidism, thyroid disease, history of cigarette smoking who presented to the emergency department with urinary burning, dyspnea and progressively worse edema.  Work-up shows BNP of over 2000 pg/mL, worsening renal function and retroperitoneal mass.    Palliative care asked to get involved in the setting of worsening kidney function and large left retroperitoneal paraglioma.   This NP visited patient at the bedside as a follow up to for palliative medicine needs and emotional support, and to meet with family again today   for ongoing education regarding goals of care, treatment option decisions and advance care planning.  Today patient is more alert and oral intake has improved some according to family. I spoke with son and wife by telephone  Continued education offered on the complex current medical situation; chronic kidney disease stage IV, acute on chronic congestive heart failure, A-fib a flutter, paraganglioma, urinary retention, DM, HTN, anemia of chronic disease and overall failure to thrive.    Labs reviewed with family-- discussed today Catecholamines specific labs and recommended and initiated Doxazosin/alpa blocker on 12-15-21   I verbalized my concern for patient's overall continued failure to thrive and high risk for  decompensation.  Both patient's wife and son verbalized an understanding of the seriousness of the patient's medical situation, however they remain hopeful and are open to all offered and available medical interventions to prolong life.  Son is verbalizing request for consideration of direct transfer to [place] or [place].     Family encouraged to continue exploring with patient his personal goals of care and desire to seek aggressive life-prolonging measures.  Education offered today regarding  the importance of continued conversation with each other and the medical providers regarding overall plan of care and treatment options,  ensuring decisions are within the context of the patients values and GOCs.  Education offered on the difference between aggressive medical intervention path and a palliative comfort path for this, patient at this time in this situation.   Questions and concerns addressed   Discussed with Dr [name]   PMT will continue to support holistically   Wadie Lessen NP  Palliative Medicine Team Team Phone # [phone] Pager [phone]

## 2021-12-23 DIAGNOSIS — N189 Chronic kidney disease, unspecified: Secondary | ICD-10-CM | POA: Diagnosis not present

## 2021-12-23 DIAGNOSIS — R627 Adult failure to thrive: Secondary | ICD-10-CM | POA: Diagnosis not present

## 2021-12-23 DIAGNOSIS — D447 Neoplasm of uncertain behavior of aortic body and other paraganglia: Secondary | ICD-10-CM | POA: Diagnosis not present

## 2021-12-23 DIAGNOSIS — R339 Retention of urine, unspecified: Secondary | ICD-10-CM | POA: Diagnosis not present

## 2021-12-23 DIAGNOSIS — N185 Chronic kidney disease, stage 5: Secondary | ICD-10-CM

## 2021-12-23 DIAGNOSIS — N179 Acute kidney failure, unspecified: Secondary | ICD-10-CM | POA: Diagnosis not present

## 2021-12-23 DIAGNOSIS — I5031 Acute diastolic (congestive) heart failure: Secondary | ICD-10-CM | POA: Diagnosis not present

## 2021-12-23 DIAGNOSIS — I130 Hypertensive heart and chronic kidney disease with heart failure and stage 1 through stage 4 chronic kidney disease, or unspecified chronic kidney disease: Secondary | ICD-10-CM | POA: Diagnosis not present

## 2021-12-23 DIAGNOSIS — Z515 Encounter for palliative care: Secondary | ICD-10-CM | POA: Diagnosis not present

## 2021-12-23 LAB — CBC
HCT: 24.7 % — ABNORMAL LOW (ref 39.0–52.0)
Hemoglobin: 7.8 g/dL — ABNORMAL LOW (ref 13.0–17.0)
MCH: 28.6 pg (ref 26.0–34.0)
MCHC: 31.6 g/dL (ref 30.0–36.0)
MCV: 90.5 fL (ref 80.0–100.0)
Platelets: 279 10*3/uL (ref 150–400)
RBC: 2.73 MIL/uL — ABNORMAL LOW (ref 4.22–5.81)
RDW: 18.2 % — ABNORMAL HIGH (ref 11.5–15.5)
WBC: 11.9 10*3/uL — ABNORMAL HIGH (ref 4.0–10.5)
nRBC: 0 % (ref 0.0–0.2)

## 2021-12-23 LAB — RENAL FUNCTION PANEL
Albumin: 3.2 g/dL — ABNORMAL LOW (ref 3.5–5.0)
Anion gap: 15 (ref 5–15)
BUN: 101 mg/dL — ABNORMAL HIGH (ref 8–23)
CO2: 27 mmol/L (ref 22–32)
Calcium: 8.8 mg/dL — ABNORMAL LOW (ref 8.9–10.3)
Chloride: 91 mmol/L — ABNORMAL LOW (ref 98–111)
Creatinine, Ser: 5.2 mg/dL — ABNORMAL HIGH (ref 0.61–1.24)
GFR, Estimated: 11 mL/min — ABNORMAL LOW (ref >60)
Glucose, Bld: 177 mg/dL — ABNORMAL HIGH (ref 70–99)
Phosphorus: 7.3 mg/dL — ABNORMAL HIGH (ref 2.5–4.6)
Potassium: 4.7 mmol/L (ref 3.5–5.1)
Sodium: 133 mmol/L — ABNORMAL LOW (ref 135–145)

## 2021-12-23 LAB — CATECHOLAMINES,UR.,FREE,24 HR
Dopamine, Rand Ur: 82 ug/L
Dopamine, Ur, 24Hr: 205 ug/24 hr (ref 0–510)
Epinephrine, Rand Ur: 9 ug/L
Epinephrine, U, 24Hr: 23 ug/24 hr — ABNORMAL HIGH (ref 0–20)
Norepinephrine, Rand Ur: 199 ug/L
Norepinephrine,U,24H: 498 ug/24 hr — ABNORMAL HIGH (ref 0–135)
Total Volume: 2500

## 2021-12-23 LAB — GLUCOSE, CAPILLARY
Glucose-Capillary: 217 mg/dL — ABNORMAL HIGH (ref 70–99)
Glucose-Capillary: 232 mg/dL — ABNORMAL HIGH (ref 70–99)

## 2021-12-23 MED ORDER — TORSEMIDE 20 MG PO TABS: 40.0000 mg | ORAL_TABLET | Freq: Two times a day (BID) | ORAL | Status: DC

## 2021-12-23 MED ORDER — DOXAZOSIN MESYLATE 4 MG PO TABS: 4.0000 mg | ORAL_TABLET | Freq: Every day | ORAL | 0 refills | Status: AC

## 2021-12-23 MED ORDER — PROMETHAZINE HCL 25 MG/ML IJ SOLN
12.5000 mg | Freq: Four times a day (QID) | INTRAMUSCULAR | Status: DC | PRN
  Filled 2021-12-23: qty 0.5

## 2021-12-23 MED ORDER — SENNOSIDES-DOCUSATE SODIUM 8.6-50 MG PO TABS: 1.0000 | ORAL_TABLET | Freq: Two times a day (BID) | ORAL | 0 refills | Status: AC

## 2021-12-23 MED ORDER — ONDANSETRON HCL 4 MG PO TABS: 8.0000 mg | ORAL_TABLET | Freq: Three times a day (TID) | ORAL | 1 refills | Status: DC | PRN

## 2021-12-23 MED ORDER — OXYCODONE-ACETAMINOPHEN 10-325 MG PO TABS: 1.0000 | ORAL_TABLET | Freq: Four times a day (QID) | ORAL | 0 refills | Status: DC | PRN

## 2021-12-23 MED ORDER — HYDROMORPHONE HCL 1 MG/ML IJ SOLN
1.0000 mg | INTRAMUSCULAR | Status: DC | PRN
  Administered 2021-12-24: 1 mg via INTRAVENOUS
  Filled 2021-12-23: qty 1

## 2021-12-23 MED ORDER — FINASTERIDE 5 MG PO TABS: 5.0000 mg | ORAL_TABLET | Freq: Every day | ORAL | 0 refills | Status: AC

## 2021-12-23 MED ORDER — OXYCODONE-ACETAMINOPHEN 7.5-325 MG PO TABS
1.0000 | ORAL_TABLET | Freq: Four times a day (QID) | ORAL | Status: DC | PRN
  Administered 2021-12-24 – 2021-12-28 (×4): 1 via ORAL
  Filled 2021-12-23 (×5): qty 1

## 2021-12-23 MED ORDER — FUROSEMIDE 10 MG/ML IJ SOLN
80.0000 mg | Freq: Once | INTRAMUSCULAR | Status: AC
Start: 2021-12-23 — End: 2021-12-23
  Administered 2021-12-23: 80 mg via INTRAVENOUS
  Filled 2021-12-23: qty 8

## 2021-12-23 MED ORDER — HYDROCODONE-ACETAMINOPHEN 5-325 MG PO TABS: 1.0000 | ORAL_TABLET | Freq: Four times a day (QID) | ORAL | 0 refills | Status: DC | PRN

## 2021-12-23 NOTE — Progress Notes (Signed)
Physical Therapy Treatment Patient Details Name: Adam Reid MRN: [id number] DOB: 09-12-1945 Today's Date: 12/23/2021   History of Present Illness The pt is a 76 yo male presenting 6/4 with SOB and hypoxia. Work up revealed elevated BNP, acute CHF exacerbation, and worsening renal function with retroperitoneal mass. PMH includes: CKD IV, CHF, DM II, hypoxic respiratory failure on 2-2.5L, HTN, PAF on Eliquis, and malnutrition.    PT Comments    Pt eager for OOB mobility this session with increased tolerance for ambulation with RW, but continues to demonstrate poor endurance with pt quick to fatigue and needing multiple standing rest breaks throughout. Pt able to demonstrate ambulation with min guard with noted improvement in stability with RW use without staggering or LOB. Pt educated on PLB and encouraged throughout activity to take deep breaths through nose as pt desatting to 85% on 3L during activity needing up to 5L to return to >92%. Pt continues to benefit from skilled PT services to progress toward functional mobility goals.    Recommendations for follow up therapy are one component of a multi-disciplinary discharge planning process, led by the attending physician.  Recommendations may be updated based on patient status, additional functional criteria and insurance authorization.  Follow Up Recommendations  No PT follow up     Assistance Recommended at Discharge PRN  Patient can return home with the following A little help with bathing/dressing/bathroom;Assistance with cooking/housework;Assist for transportation   Equipment Recommendations  None recommended by PT    Recommendations for Other Services       Precautions / Restrictions Precautions Precautions: Fall Precaution Comments: watch O2, on 3L this session Restrictions Weight Bearing Restrictions: No     Mobility  Bed Mobility Overal bed mobility: Independent             General bed mobility comments: No  use of the rails, mildly increased time    Transfers Overall transfer level: Needs assistance Equipment used: Rolling walker (2 wheels) Transfers: Sit to/from Stand Sit to Stand: Min guard           General transfer comment: minG to supervision for safety within session, mild instability on initial stand    Ambulation/Gait Ambulation/Gait assistance: Min guard Gait Distance (Feet): 225 Feet Assistive device: Rolling walker (2 wheels) Gait Pattern/deviations: Step-through pattern, Decreased stride length Gait velocity: decreased     General Gait Details: very slowed with increased stability and distance with RW, no LOB and no staggering steps   Stairs             Wheelchair Mobility    Modified Rankin (Stroke Patients Only)       Balance Overall balance assessment: Needs assistance Sitting-balance support: Feet supported, No upper extremity supported Sitting balance-Leahy Scale: Normal     Standing balance support: No upper extremity supported, During functional activity Standing balance-Leahy Scale: Fair Standing balance comment: mild instability, no UE support                            Cognition Arousal/Alertness: Awake/alert Behavior During Therapy: WFL for tasks assessed/performed Overall Cognitive Status: Within Functional Limits for tasks assessed                                          Exercises      General Comments General comments (skin integrity, edema, etc.): SpO2 to  low of 85% on 3L needing up to 5L to acheieve >92%. cues throughout for deeo breaths in through nose      Pertinent Vitals/Pain      Home Living                          Prior Function            PT Goals (current goals can now be found in the care plan section) Acute Rehab PT Goals Patient Stated Goal: to go home again soon PT Goal Formulation: With patient Time For Goal Achievement: 12/31/21    Frequency    Min  3X/week      PT Plan      Co-evaluation              AM-PAC PT "6 Clicks" Mobility   Outcome Measure  Help needed turning from your back to your side while in a flat bed without using bedrails?: None Help needed moving from lying on your back to sitting on the side of a flat bed without using bedrails?: None Help needed moving to and from a bed to a chair (including a wheelchair)?: A Little Help needed standing up from a chair using your arms (e.g., wheelchair or bedside chair)?: A Little Help needed to walk in hospital room?: A Little Help needed climbing 3-5 steps with a railing? : A Little 6 Click Score: 20    End of Session Equipment Utilized During Treatment: Gait belt Activity Tolerance: Patient tolerated treatment well;Patient limited by fatigue Patient left: in bed;with call bell/phone within reach;with nursing/sitter in room Nurse Communication: Mobility status PT Visit Diagnosis: Muscle weakness (generalized) (M62.81)     Time: 1216-2446 PT Time Calculation (min) (ACUTE ONLY): 25 min  Charges:  $Gait Training: 23-37 mins                     Audry Riles. PTA Acute Rehabilitation Services Office: [place] 12/23/2021, 12:27 PM

## 2021-12-23 NOTE — Hospital Course (Addendum)
76 year old man PMH paraganglioma diagnosed May 2023 with plans for outpatient follow-up with oncology in [facility], chronic diastolic CHF diabetes mellitus type 2, PAF, recently diagnosed retroperitoneal mass, presented with difficulty urinating and leg swelling.  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 Seen by cardiology for treatment of acute CHF, oncology for recommendations for paraganglioma (recommended general surgery evaluation for removal of the retroperitoneal mass.  Outpatient follow-up with endocrinology.) seen by nephrology for AKI/CKD stage IV, combination of urinary retention and cardiorenal syndrome. General surgery recommended tertiary care center. Condition stabilized. Plan for home tomorrow with outpatient follow-up.

## 2021-12-23 NOTE — Progress Notes (Signed)
Patient ID: Caio Devera, male   DOB: 1945-09-20, 76 y.o.   MRN: [id number]    Progress Note from the Palliative Medicine Team at [facility]   Patient Name: Dugan Vanhoesen        Date: 12/23/2021 DOB: 07-14-1945  Age: 76 y.o. MRN#: [id number] Attending Physician: Deatra James, MD Primary Care Physician: [name], MD Admit Date: 12/13/2021   Medical records reviewed ,  discussed case with attending  Per intake H&P -->  76 year old male with a past medical history of chronic diastolic CHF, BPH, anemia chronic disease, stage IV CKD, type II DM, depression, hypertension, history of GI bleed, paroxysmal atrial fibrillation, secondary hyper parathyroidism, thyroid disease, history of cigarette smoking who presented to the emergency department with urinary burning, dyspnea and progressively worse edema.  Work-up shows BNP of over 2000 pg/mL, worsening renal function and retroperitoneal mass.    Palliative care asked to get involved in the setting of worsening kidney function and large left retroperitoneal paraglioma.   This NP visited patient at the bedside as a follow up to for palliative medicine needs and emotional support, and to meet with family again today   for ongoing education regarding goals of care, treatment option decisions and advance care planning.  Patient is more alert and oral intake has improved however Creatinine is worse today 5.20  Discussions over the last 9 days family make decision, worked out a Secretary/administrator and discussed with attending today for patient to discharge in the morning and follow-up with outpatient endocrinology appointment at [facility].  Plan is for patient's son to return from [place] this afternoon to assist with discharge and appointment  Later in the day patient's wife/ [name] called and we spoke in great detail again about the patient's overall complex medical situation specific to his worsening renal function, underlying paraganglioma and overall  failure to thrive.  Detailed education offered on the importance of making decisions for this patient within the context of the patient's personal goals of care and understanding his complex medical situation and the limitations of medical interventions for him at this time, hoping for prolonged quality of life.  Wife is questioning discharge. Wife verbalizes understanding and expresses how overwhelmed and stressed the situation as for her family.           Later in the evening patient's son/Amir called me on the phone.  He to now is questioning the plan for patient to discharge in the morning attempting to make appointment with endocrinology.  He is worried that his father is too weak and that this is not a good plan after all.    At this time patient and family wish to discuss initiation of dialysis if indicated.  Son is on his way back to [facility] and will be at the hospital in the morning to discuss with attending next steps in plan of care.    Again today shared with both wife and son,  my concern for patient's overall continued failure to thrive and high risk for decompensation.  Family encouraged to continue exploring with patient his personal goals of care and desire to seek aggressive life-prolonging measures. Education offered on the importance of preparing for anticipatory care needs once patient is discharged from the hospital.  Education offered on both outpatient community-based palliative services and hospice benefit; philosophy and eligibility..   Patient continues to give medical decision power to his son and wife.    Questions and concerns addressed   Discussed with treatment team  PMT will continue to support holistically   Wadie Lessen NP  Palliative Medicine Team Team Phone # [phone] Pager [phone]

## 2021-12-23 NOTE — Progress Notes (Addendum)
Patient ID: Adam Reid, male   DOB: 1946-06-21, 76 y.o.   MRN: [id number]  Assessment/Plan:  AKI/CKD stage IV - presumably combination of urinary retention and cardiorenal syndrome in the setting of acute on chronic CHF.  BUN/Cr have reached a plateau.  Good UOP after foley catheter and stopping IV lasix.  No indication for hemodialysis, however we may need to discuss the possible need for HD if his renal function continues to worsen.  He is a marginal candidate for long-term dialysis given all of his co-morbidities.  He normally follows with Dr. [name] at [facility] and will need hospital follow up after discharge.  Avoid nephrotoxic agents such as IV contrast, NSAIDs, phosphorous containing bowel preps.  Renal dose meds for GFR <15. Continue to follow UOP and daily Scr.  Stopped lasix 80 mg IV daily due to rise in Scr on 12/16/21 but he remains negative with I&O's. At home he is on Torsemide '20mg'$  2 tabs BID -> will give single dose of Lasix IV; rales on auscultation and UOP starting to decrease. Will need to d/c with the Trsemide '20mg'$  2 tabs BID. He still remains 7  liters negative since admission. Continued discussing with the son (optometrist in [place]); pt deferring his care to his son and wife. He prefers not to do dialysis but is open to it in the future; at this point I would just use a TC if needed for HD in future given very guarded prognosis with the paraganglioma. Renal function variation and wrong direction today with UOP decreasing c/w prior 24hrs; no indication for initiation of RRT. I d/w the son again today over the phone and counseled that there are no absolute indications for RRT and high likelihood that if he's started on dialysis that it would be permanent. They would like to hold off and if possible be d/c for his endocrinology appt at [facility] tomorrow at [facility]. Will need f/u w/ Dr. [name] ([facility]) in 2 weeks as well upon dc. Office notified and they will call him.   Acute on chronic  diastolic CHF - improved.  Palliative care following for worsening cardiac function and FTT.  He is now DNR P. 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  Surgery recommends transfer to tertiary care center given high risk.  Declined for transfer but recommended to start alpha blocking agent.  Will need outpatient follow up. Urinary retention - foley replaced.  Predated hospitalization.  DM type 2 - per primary HTN - stable Anemia of CKD stage IV - follow H/H.  Hold off on ESA given recent diagnosis of paraganglioma.  Will defer management to Oncology.  Given IV iron.  Transfuse for Hgb <7.    S: Dysuria continues but otherwise no new complaints. Son on phone and I answered all his questions. States pain/ burning from foley worse w/ vicodin vs Dilaudid PO.   O:BP (!) 147/67 (BP Location: Left Arm)   Pulse 73   Temp 97.9 F (36.6 C) (Axillary)   Resp 18   Ht '5\' 6"'$  (1.676 m)   Wt 53.7 kg   SpO2 100%   BMI 19.11 kg/m   Intake/Output Summary (Last 24 hours) at 12/23/2021 1113 Last data filed at 12/23/2021 0432 Gross per 24 hour  Intake 360 ml  Output 700 ml  Net -340 ml   Intake/Output: I/O last 3 completed shifts: In: 600 [P.O.:600] Out: 1200 [Urine:1200]  Intake/Output this shift:  No  intake/output data recorded. Weight change: 0.266 kg Gen:NAD CVS: RRR Resp: rales b/l Abd: +BS, soft, NT/ND Ext: no edema GU: foley to gravity  Recent Labs  Lab 12/17/21 0402 12/18/21 0349 12/19/21 0229 12/20/21 0350 12/21/21 0325 12/22/21 0306 12/23/21 0332  NA 132* 132* 131* 134* 133* 134* 133*  K 4.4 3.8 4.4 3.7 4.8 4.3 4.7  CL 96* 95* 92* 95* 94* 94* 91*  CO2 '22 25 25 26 25 27 27  '$ GLUCOSE 153* 112* 139* 148* 138* 144* 177*  BUN 108* 106* 103* 97* 99* 92* 101*  CREATININE 5.02* 5.04* 5.03* 4.72* 4.92* 4.71* 5.20*  ALBUMIN 2.8* 2.9* 3.0* 2.8* 2.9* 2.9* 3.2*  CALCIUM 8.4* 8.3* 8.5* 8.6* 8.4* 8.4*  8.8*  PHOS 8.0* 7.5* 7.0* 6.5* 7.6* 6.6* 7.3*   Liver Function Tests: Recent Labs  Lab 12/21/21 0325 12/22/21 0306 12/23/21 0332  ALBUMIN 2.9* 2.9* 3.2*   No results for input(s): "LIPASE", "AMYLASE" in the last 168 hours. No results for input(s): "AMMONIA" in the last 168 hours. CBC: Recent Labs  Lab 12/19/21 0229 12/20/21 0350 12/21/21 0325 12/22/21 0306 12/23/21 0332  WBC 12.2* 9.2 11.0* 12.3* 11.9*  HGB 8.0* 7.4* 7.6* 7.4* 7.8*  HCT 24.3* 23.7* 24.5* 23.4* 24.7*  MCV 86.2 87.1 87.8 88.3 90.5  PLT 272 271 264 254 279   Cardiac Enzymes: No results for input(s): "CKTOTAL", "CKMB", "CKMBINDEX", "TROPONINI" in the last 168 hours.  CBG: Recent Labs  Lab 12/22/21 0617 12/22/21 1112 12/22/21 1601 12/22/21 2125 12/23/21 0554  GLUCAP 122* 230* 210* 257* 217*    Iron Studies: No results for input(s): "IRON", "TIBC", "TRANSFERRIN", "FERRITIN" in the last 72 hours. Studies/Results: No results found.  amiodarone  200 mg Oral Daily   apixaban  2.5 mg Oral BID   atorvastatin  80 mg Oral Daily   calcium acetate  667 mg Oral TID with meals   Chlorhexidine Gluconate Cloth  6 each Topical Daily   citalopram  20 mg Oral Daily   diltiazem  180 mg Oral Daily   doxazosin  4 mg Oral Daily   finasteride  5 mg Oral Daily    HYDROmorphone (DILAUDID) injection  0.5 mg Intravenous Once   insulin aspart  0-6 Units Subcutaneous TID WC   linagliptin  5 mg Oral Daily   polyethylene glycol  17 g Oral Daily   senna-docusate  1 tablet Oral BID   sodium bicarbonate  650 mg Oral BID   sodium chloride flush  3 mL Intravenous Q12H   torsemide  40 mg Oral BID    BMET    Component Value Date/Time   NA 133 (L) 12/23/2021 0332   NA 137 06/19/2020 0936   K 4.7 12/23/2021 0332   CL 91 (L) 12/23/2021 0332   CO2 27 12/23/2021 0332   GLUCOSE 177 (H) 12/23/2021 0332   BUN 101 (H) 12/23/2021 0332   BUN 51 (H) 06/19/2020 0936   CREATININE 5.20 (H) 12/23/2021 0332   CREATININE 2.68 (H)  03/26/2016 1839   CALCIUM 8.8 (L) 12/23/2021 0332   GFRNONAA 11 (L) 12/23/2021 0332   GFRAA 21 (L) 06/19/2020 0936   CBC    Component Value Date/Time   WBC 11.9 (H) 12/23/2021 0332   RBC 2.73 (L) 12/23/2021 0332   HGB 7.8 (L) 12/23/2021 0332   HGB 9.1 (L) 09/14/2019 1555   HCT 24.7 (L) 12/23/2021 0332   HCT 28.4 (L) 09/14/2019 1555   PLT 279 12/23/2021 0332   PLT 464 (H) 09/14/2019  1555   MCV 90.5 12/23/2021 0332   MCV 82 09/14/2019 1555   MCH 28.6 12/23/2021 0332   MCHC 31.6 12/23/2021 0332   RDW 18.2 (H) 12/23/2021 0332   RDW 16.4 (H) 09/14/2019 1555   LYMPHSABS 0.4 (L) 12/13/2021 1011   LYMPHSABS 0.6 (L) 10/05/2017 1433   MONOABS 0.5 12/13/2021 1011   EOSABS 0.1 12/13/2021 1011   EOSABS 0.1 10/05/2017 1433   BASOSABS 0.1 12/13/2021 1011   BASOSABS 0.0 10/05/2017 1433

## 2021-12-23 NOTE — Discharge Summary (Signed)
Physician Discharge Summary   Patient: Adam Reid MRN: [id number] DOB: 03/22/1946  Admit date:     12/13/2021  Discharge date: 12/24/2021  Discharge Physician: Kendell Bane   PCP: Shade Flood, MD   Recommendations at discharge:  Follow-up with endocrinologist at [facility] on 12/24/2021 Patient to be treated immediately return to the ED recommending [facility] as his endocrinologist is there patient requesting to be seen and treated per his endocrinologist Follow-up as an outpatient with palliative care team  Discharge Diagnoses: Principal Problem:   Cardiorenal syndrome Active Problems:   Acute renal failure superimposed on chronic kidney disease (HCC)   Acute CHF (congestive heart failure) (HCC)   AKI (acute kidney injury) (HCC)   Palliative care by specialist   Urinary retention   Adult failure to thrive   Paraganglioma (HCC)  Resolved Problems:   * No resolved hospital problems. *  Hospital Course: 76 y.o. 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 CT demonstrated enlargement of retroperitoneal mass. The patient was found to be retaining urine, so foley catheter was placed and he was admitted for AKI on stage IV CKD with nephrology, cardiology, and palliative care consulted. Patient requested discontinuation of foley but had recurrent retention, so coude catheter was placed 6/6.  -Seen by oncology, recommended evaluation by surgical oncology -Overall prognosis felt to be poor, s/p Palliative care meeting with patient and family 6/6 with determination that the patient would like no restrictions to medical care,  hope to pursue measures with curative intent.  -Dr.Grunz discussed in detail with endocrinology  consultant and surgical oncology at [facility] on 6/6 who recommended the work up as ordered and follow up after discharge. Did not recommend inpatient transfer for management of paraganglioma.  -6/8: -> DNR   Subjective: The patient was seen and examined, stable, complaining of some nausea Much improved with pain medication  Patient's wife and his son is requesting for him to be discharged in the morning so that he can follow-up with his primary endocrinologist [facility]  ----------------------------------------------------------------------------------------------------------------   Acute on chronic HFpEF: Echo shows LVEF 55-60%, mild basal-septal LVH and grade III diastolic dysfunction, elevated PASP and RV enlargement, IVC dilated. Moderate MR. -Diuresed with IV Lasix initially, held for last 4+ days, creatinine worsening 4.7-4.9 >>> 5.20, BUN is 101,  -Anticipate need to resume diuretics  -Continues to have fair urine output, frequent hospitalizations with fluid overload in the last few months -GDMT limited by CKD -Cardiology signed off, not much further to offer   AKI on stage IV CKD:  -Worsening kidney function BUN 101, creatinine 5.20 now  -Secondary to cardiorenal syndrome and urinary retention -Now back with a coud catheter for recurrent retention -Nephrology following, diuresed initially, subsequently diuretics on hold, BUN and creatinine with mild worsening, has some marginal uremic symptoms as well, urine output is still fair, anticipate need for dialysis in the near future, palliative care following as well, patient reportedly now does not want to do dialysis at this time but seems open to this in the future, advised son to kindly clarify this -Overall prognosis is poor, palliative care discussed with patients family again 6/14, he has deferred all decisions to his wife and son, now DNR -Per nephrology  acute urinary retention: BPH noted on prior imaging -On  admission and required Foley catheter for retention and AKI , patient requested discontinuation of foley due to pain despite lidocaine and oral medications.  -Developed recurrent retention,  coud catheter was placed on 6/6  -Now started on Cardura in the setting of paraganglioma as well  -6/10 AM with dysuria, coud catheter changed 6/10, symptoms improving, UA with scant hematuria-no s/s of infection   Retroperitoneal paraganglioma: Enlarging. 6.7 x 5.4 cm less than a month ago, now 6.9 x 6.1 x 7.9 cm. Location abuts the infrarenal aorta.  - D/w oncology who recommended endocrinology and surgical evaluation. Surgery locally was contacted and deferred to tertiary care centers.  Dr. Bonner Puna discussed with Dr. Cam Hai who is an endocrine surgeon at [place]. He recommended starting doxazosin and continuing this until follow up. He will have his office reach out to patient's wife to schedule follow up next week. Of note, the enlargement seen on repeat CT may be related to bleeding from biopsy and not true tumor proliferation. Dr.Randle specifically stated he would not recommend any surgical intervention at this time, so transfer for this reason would be futile.  -Dr. Bonner Puna also discussed with endocrinology, Dr. Buddy Duty, who confirmed work up with urine metanephrines, urine catecholamines, and plasma metanephrines is the appropriate next step in hopes of determining whether this is functioning vs. not hormonally actively functioning tumor.   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  - No ESA with malignancy. Of note, had an appointment scheduled as outpatient for feraheme and retacrit on 6/7. -Given IV iron 6/6, 6/7 -Hemoglobin stable in the 7 range, given repeat IV iron 6/11-12   T2DM:  -CBGs now stable - Very sensitive SSI given his malnutrition and renal impairment.  - Continue formulary -gliptin will be resumed   Constipation -Resuming and continue Senokot and MiraLAX    Moderate protein calorie malnutrition: Body mass index is 18.54 kg/m. Suspect significant catabolic state given enlarging mass with central necrosis.  -Protein supplements   PAF/flutter: Currently in NSR. Last TSH 4.9. - Continue eliquis - Continue diltiazem, amiodarone   CAD, aortic atherosclerosis: - Continue statin, anticoagulation for now.    Depression: Quiescent - Continue SSRI   Goals of care discussions Overall prognosis is felt to be poor in the setting of CHF, CKD4-5, severe malnutrition, large retroperitoneal paraganglioma, ongoing failure to thrive.  Discussed poor prognosis with the patient again 6/7 and 6/8, ND  12/23/2021 -Seen by palliative care 6/6, 6/7: Poor prognosis discussed ,  -Discussed the case in detail with patient's wife at bedside on his son AMIR  on the phone--they are making decisions on his behalf, we recommended DNR, she was agreeable, CODE STATUS changed, palliative care was consulted      DVT prophylaxis: Apixaban Code Status: DNR, after d/w wife Family Communication: Updated son at bedside today Disposition Plan: Discharging home to follow-up with endocrinologist 12/24/2021   Consultants: Nephrology, cardiology      Pain control - [facility] Controlled Substance Reporting System database  was reviewed. and patient was instructed, not to drive, operate heavy machinery, perform activities at heights, swimming or participation in water activities  or provide baby-sitting services while on Pain, Sleep and Anxiety Medications; until their outpatient Physician has advised to do so again. Also recommended to not to take more than prescribed Pain, Sleep and Anxiety Medications.   Disposition: Home Diet recommendation:  Discharge Diet Orders (From admission, onward)     Start     Ordered   12/23/21 0000  Diet - low sodium heart healthy        12/23/21 1302           Cardiac diet DISCHARGE MEDICATION: Allergies as of 12/23/2021       Reactions   Pork-derived Products Other (See Comments)   Patient "just does not eat this"        Medication List     STOP taking these medications    tamsulosin 0.4 MG Caps capsule Commonly known as: FLOMAX       TAKE these medications    amiodarone 200 MG tablet Commonly known as: PACERONE Take 1 tablet (200 mg total) by mouth daily. Follow up appt required for further refills. Please call [phone] to schedule an appt What changed: additional instructions   atorvastatin 80 MG tablet Commonly known as: LIPITOR TAKE 1 TABLET(80 MG) BY MOUTH DAILY What changed:  how much to take how to take this when to take this additional instructions   blood glucose meter kit and supplies Use up to two times daily as directed  ICD10 E10.9 E11.9   Calphron 667 MG tablet Generic drug: calcium acetate Take 667 mg by mouth 3 (three) times daily.   citalopram 20 MG tablet Commonly known as: CELEXA TAKE 1 TABLET(20 MG) BY MOUTH DAILY What changed: See the new instructions.   diltiazem 180 MG 24 hr capsule Commonly known as: CARDIZEM CD TAKE 1 CAPSULE(180 MG) BY MOUTH DAILY What changed: See the new instructions.   doxazosin 4 MG tablet Commonly known as: CARDURA Take 1 tablet (4 mg total) by mouth daily. Start taking on: December 24, 2021   Eliquis 2.5 MG Tabs tablet Generic drug: apixaban TAKE 1 TABLET(2.5 MG) BY MOUTH TWICE DAILY What changed: See the new instructions.    finasteride 5 MG tablet Commonly known as: PROSCAR Take 1 tablet (5 mg total) by mouth daily. Start taking on: December 24, 2021   glipiZIDE 5 MG 24 hr tablet Commonly known as: GLUCOTROL XL Take 1 tablet (5 mg total) by mouth daily with breakfast.   glipiZIDE 2.5 MG 24 hr tablet Commonly known as: GLUCOTROL XL Take 1 tablet (2.5 mg total) by mouth daily with breakfast. With $RemoveBeforeD'5mg'KhJbBDwaAnvVez$  for total dose of 7.$RemoveB'5mg'uZHfUgKt$  qd.   GlucoCom Lancets Misc Use for home glucose monitoring   HYDROcodone-acetaminophen 5-325 MG tablet Commonly known as: NORCO/VICODIN Take 1 tablet by mouth every 6 (six) hours as needed for up to 3 days for moderate pain or severe pain.   OneTouch Verio test strip Generic drug: glucose blood Test up to 2 times per day.  Uncontrolled diabetes with hyperglycemia and stage 4 CKD.   polyethylene glycol 17 g packet Commonly known as: MIRALAX / GLYCOLAX Take 17 g by mouth daily as needed for mild constipation or moderate constipation (MIX AND DRINK).   sitaGLIPtin 25 MG tablet Commonly known as: Januvia TAKE 1 TABLET(25 MG) BY MOUTH DAILY What changed:  how much to take how to take this when to take this  additional instructions   torsemide 20 MG tablet Commonly known as: DEMADEX Take 2 tablets (40 mg total) by mouth 2 (two) times daily.   vitamin B-12 500 MCG tablet Commonly known as: CYANOCOBALAMIN Take 500 mcg by mouth daily.   VITAMIN D3 PO Take 1 tablet by mouth daily.        Follow-up Information     Stark Klein, MD Follow up.   Specialty: General Surgery Why: Call to confirm your appointment date and time. We are working hard to make this for you. Please bring a copy of your photo ID and insurance card. Please arrive 30 minutes prior to your appointment for paperwork. Contact information: [address] [place] [address] [phone]                Discharge Exam: Danley Danker Weights   12/21/21 0427 12/22/21 0521 12/23/21 0428   Weight: 53.1 kg 53.4 kg 53.7 kg      Physical Exam:   General:  AAO x 3,  cooperative, no distress;   HEENT:  Normocephalic, PERRL, otherwise with in Normal limits   Neuro:  CNII-XII intact. , normal motor and sensation, reflexes intact   Lungs:   Clear to auscultation BL, Respirations unlabored,  No wheezes / crackles  Cardio:    S1/S2, RRR, No murmure, No Rubs or Gallops   Abdomen:  Soft, non-tender, bowel sounds active all four quadrants, no guarding or peritoneal signs.  Muscular  skeletal:  Limited exam - sever global generalized weaknesses - in bed, able to move all 4 extremities,   2+ pulses,  symmetric, No pitting edema  Skin:  Dry, warm to touch, negative for any Rashes,  Wounds: Please see nursing documentation          Condition at discharge: poor  The results of significant diagnostics from this hospitalization (including imaging, microbiology, ancillary and laboratory) are listed below for reference.   Imaging Studies: CT ABDOMEN PELVIS WO CONTRAST  Result Date: 12/13/2021 CLINICAL DATA:  76 year old male with history of abnormal chest x-ray. Pneumonia. Pleural effusion. Suspected malignancy. EXAM: CT CHEST, ABDOMEN AND PELVIS WITHOUT CONTRAST TECHNIQUE: Multidetector CT imaging of the chest, abdomen and pelvis was performed following the standard protocol without IV contrast. RADIATION DOSE REDUCTION: This exam was performed according to the departmental dose-optimization program which includes automated exposure control, adjustment of the mA and/or kV according to patient size and/or use of iterative reconstruction technique. COMPARISON:  Chest CT 11/15/2021. CT the abdomen and pelvis 11/17/2021. FINDINGS: Comment: Today's study is severely limited for detection and characterization of visceral and/or vascular lesions by lack of IV contrast. CT CHEST FINDINGS Cardiovascular: Heart size is mildly enlarged. There is no significant pericardial fluid, thickening or  pericardial calcification. There is aortic atherosclerosis, as well as atherosclerosis of the great vessels of the mediastinum and the coronary arteries, including calcified atherosclerotic plaque in the left main, left anterior descending, left circumflex and right coronary arteries. Mediastinum/Nodes: Multiple prominent borderline enlarged mediastinal lymph nodes are noted, nonspecific, but similar to the recent prior study, favored to be reactive. No definite pathologically enlarged mediastinal or hilar lymph nodes are confidently identified on today's noncontrast CT examination. Esophagus is unremarkable in appearance. No axillary lymphadenopathy. Lungs/Pleura: Moderate right chronic partially loculated pleural effusion and small left chronic partially loculated pleural effusion, both of which are stable compared to the prior study. Both of these are associated with extensive areas of pleuroparenchymal thickening and nodular/mass-like architectural distortion in the lungs bilaterally,  with associated pleural tails", most compatible with extensive areas of rounded atelectasis. There continues to be some patchy multifocal ground-glass attenuation in the lungs bilaterally, some of which appears improved compared to the prior study, but other areas have clearly increased compared to the prior examination, with some associated septal thickening, most evident in the anterior aspect of the right upper lobe (best appreciated on axial image 58 of series 3). Musculoskeletal: There are no aggressive appearing lytic or blastic lesions noted in the visualized portions of the skeleton. CT ABDOMEN PELVIS FINDINGS Hepatobiliary: No definite suspicious appearing hepatic lesions are confidently identified on today's noncontrast CT examination. Unenhanced appearance of the gallbladder is unremarkable. Pancreas: No definite pancreatic mass or peripancreatic fluid collections or inflammatory changes are confidently identified on  today's noncontrast CT examination. Spleen: Unremarkable. Adrenals/Urinary Tract: Low-attenuation lesions in the upper pole of the right kidney measuring up to 2.9 x 2.3 cm, incompletely characterized on today's non-contrast CT examination, but statistically likely to represent cysts. Other subcentimeter high attenuation lesions are noted in the left kidney, incompletely characterized, but favored to represent proteinaceous/hemorrhagic cysts. Low-attenuation adreniform thickening bilaterally, favored to represent adenomatous hyperplasia. Large left retroperitoneal mass (discussed below) which is favored to be separate from the left adrenal gland, although this does make contact with the anterior aspect of the left adrenal gland such that an adrenal lesion is not entirely excluded. No hydroureteronephrosis. Urinary bladder is markedly distended, but otherwise unremarkable in appearance on today's noncontrast CT examination. Stomach/Bowel: The appearance of the stomach is normal. No pathologic dilatation of small bowel or colon. The appendix is not confidently identified and may be surgically absent. Regardless, there are no inflammatory changes noted adjacent to the cecum to suggest the presence of an acute appendicitis at this time. Vascular/Lymphatic: Aortic atherosclerosis. Large left para-aortic retroperitoneal mass could represent malignant centrally necrotic lymphadenopathy, however, this is favored to represent a primary retroperitoneal lesion (see discussion below). No other lymphadenopathy noted elsewhere in the abdomen or pelvis. Reproductive: Prostate gland and seminal vesicles are unremarkable in appearance. Other: Again noted is a large left-sided para-aortic retroperitoneal mass measuring approximately 6.9 x 6.1 x 7.9 cm (axial image 27 of series 2 and coronal image 38 of series 5), previously 6.7 x 5.4 x 6.9 cm). This is intimately associated with the adjacent structures, including the abdominal aorta  left renal artery, proximal superior mesenteric artery, anterior aspect of the left adrenal gland, and multiple small bowel loops. The lesion appears separate from the pancreas. The lesion is heterogeneous in attenuation with some central areas that are lower attenuation (21 HU) and peripheral intermediate attenuation (38 HU). No significant volume of ascites. No pneumoperitoneum. Musculoskeletal: There are no aggressive appearing lytic or blastic lesions noted in the visualized portions of the skeleton. IMPRESSION: 1. 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 This may represent a primary retroperitoneal neoplasm, but differential considerations are very broad given the location, and include malignant lymphadenopathy, vascular lesions, lesions of adrenal origin, gastrointestinal lesion of the small bowel, etc. Further evaluation with abdominal MRI with and without IV gadolinium is recommended to better characterize this finding. 2. Shifting pattern of ground-glass attenuation and septal thickening in the lungs is of uncertain etiology and significance, but is overall slightly worsened and likely of infectious or inflammatory etiology. Infiltrative neoplasm such as metastatic adenocarcinoma is difficult to entirely exclude. 3. Chronic bilateral pleural effusions with  extensive rounded atelectasis in both lungs, similar to the prior study. 4. Mild cardiomegaly. 5. Aortic atherosclerosis, in addition to left main and three-vessel coronary artery disease. Assessment for potential risk factor modification, dietary therapy or pharmacologic therapy may be warranted, if clinically indicated. 6. Additional incidental findings, as above. Electronically Signed   By: Vinnie Langton M.D.   On: 12/13/2021 13:31   CT Chest Wo Contrast  Result Date: 12/13/2021 CLINICAL DATA:  76 year old  male with history of abnormal chest x-ray. Pneumonia. Pleural effusion. Suspected malignancy. EXAM: CT CHEST, ABDOMEN AND PELVIS WITHOUT CONTRAST TECHNIQUE: Multidetector CT imaging of the chest, abdomen and pelvis was performed following the standard protocol without IV contrast. RADIATION DOSE REDUCTION: This exam was performed according to the departmental dose-optimization program which includes automated exposure control, adjustment of the mA and/or kV according to patient size and/or use of iterative reconstruction technique. COMPARISON:  Chest CT 11/15/2021. CT the abdomen and pelvis 11/17/2021. FINDINGS: Comment: Today's study is severely limited for detection and characterization of visceral and/or vascular lesions by lack of IV contrast. CT CHEST FINDINGS Cardiovascular: Heart size is mildly enlarged. There is no significant pericardial fluid, thickening or pericardial calcification. There is aortic atherosclerosis, as well as atherosclerosis of the great vessels of the mediastinum and the coronary arteries, including calcified atherosclerotic plaque in the left main, left anterior descending, left circumflex and right coronary arteries. Mediastinum/Nodes: Multiple prominent borderline enlarged mediastinal lymph nodes are noted, nonspecific, but similar to the recent prior study, favored to be reactive. No definite pathologically enlarged mediastinal or hilar lymph nodes are confidently identified on today's noncontrast CT examination. Esophagus is unremarkable in appearance. No axillary lymphadenopathy. Lungs/Pleura: Moderate right chronic partially loculated pleural effusion and small left chronic partially loculated pleural effusion, both of which are stable compared to the prior study. Both of these are associated with extensive areas of pleuroparenchymal thickening and nodular/mass-like architectural distortion in the lungs bilaterally, with associated pleural tails", most compatible with extensive  areas of rounded atelectasis. There continues to be some patchy multifocal ground-glass attenuation in the lungs bilaterally, some of which appears improved compared to the prior study, but other areas have clearly increased compared to the prior examination, with some associated septal thickening, most evident in the anterior aspect of the right upper lobe (best appreciated on axial image 58 of series 3). Musculoskeletal: There are no aggressive appearing lytic or blastic lesions noted in the visualized portions of the skeleton. CT ABDOMEN PELVIS FINDINGS Hepatobiliary: No definite suspicious appearing hepatic lesions are confidently identified on today's noncontrast CT examination. Unenhanced appearance of the gallbladder is unremarkable. Pancreas: No definite pancreatic mass or peripancreatic fluid collections or inflammatory changes are confidently identified on today's noncontrast CT examination. Spleen: Unremarkable. Adrenals/Urinary Tract: Low-attenuation lesions in the upper pole of the right kidney measuring up to 2.9 x 2.3 cm, incompletely characterized on today's non-contrast CT examination, but statistically likely to represent cysts. Other subcentimeter high attenuation lesions are noted in the left kidney, incompletely characterized, but favored to represent proteinaceous/hemorrhagic cysts. Low-attenuation adreniform thickening bilaterally, favored to represent adenomatous hyperplasia. Large left retroperitoneal mass (discussed below) which is favored to be separate from the left adrenal gland, although this does make contact with the anterior aspect of the left adrenal gland such that an adrenal lesion is not entirely excluded. No hydroureteronephrosis. Urinary bladder is markedly distended, but otherwise unremarkable in appearance on today's noncontrast CT examination. Stomach/Bowel: The appearance of the stomach is normal. No pathologic dilatation of small bowel  or colon. The appendix is not  confidently identified and may be surgically absent. Regardless, there are no inflammatory changes noted adjacent to the cecum to suggest the presence of an acute appendicitis at this time. Vascular/Lymphatic: Aortic atherosclerosis. Large left para-aortic retroperitoneal mass could represent malignant centrally necrotic lymphadenopathy, however, this is favored to represent a primary retroperitoneal lesion (see discussion below). No other lymphadenopathy noted elsewhere in the abdomen or pelvis. Reproductive: Prostate gland and seminal vesicles are unremarkable in appearance. Other: Again noted is a large left-sided para-aortic retroperitoneal mass measuring approximately 6.9 x 6.1 x 7.9 cm (axial image 27 of series 2 and coronal image 38 of series 5), previously 6.7 x 5.4 x 6.9 cm). This is intimately associated with the adjacent structures, including the abdominal aorta left renal artery, proximal superior mesenteric artery, anterior aspect of the left adrenal gland, and multiple small bowel loops. The lesion appears separate from the pancreas. The lesion is heterogeneous in attenuation with some central areas that are lower attenuation (21 HU) and peripheral intermediate attenuation (38 HU). No significant volume of ascites. No pneumoperitoneum. Musculoskeletal: There are no aggressive appearing lytic or blastic lesions noted in the visualized portions of the skeleton. IMPRESSION: 1. 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 This may represent a primary retroperitoneal neoplasm, but differential considerations are very broad given the location, and include malignant lymphadenopathy, vascular lesions, lesions of adrenal origin, gastrointestinal lesion of the small bowel, etc. Further evaluation with abdominal MRI with and without IV gadolinium is recommended to better  characterize this finding. 2. Shifting pattern of ground-glass attenuation and septal thickening in the lungs is of uncertain etiology and significance, but is overall slightly worsened and likely of infectious or inflammatory etiology. Infiltrative neoplasm such as metastatic adenocarcinoma is difficult to entirely exclude. 3. Chronic bilateral pleural effusions with extensive rounded atelectasis in both lungs, similar to the prior study. 4. Mild cardiomegaly. 5. Aortic atherosclerosis, in addition to left main and three-vessel coronary artery disease. Assessment for potential risk factor modification, dietary therapy or pharmacologic therapy may be warranted, if clinically indicated. 6. Additional incidental findings, as above. Electronically Signed   By: Vinnie Langton M.D.   On: 12/13/2021 13:31   DG Chest Portable 1 View  Result Date: 12/13/2021 CLINICAL DATA:  Shortness of breath.  Renal failure. EXAM: PORTABLE CHEST 1 VIEW COMPARISON:  12/10/2021 radiograph And prior studies FINDINGS: Cardiomediastinal silhouette is unchanged. Pulmonary vascular congestion again noted. Moderate RIGHT pleural effusion and small to moderate LEFT pleural effusion are again noted. Unchanged bilateral pulmonary opacities/scarring again identified. Nodular opacity overlying the RIGHT UPPER lung appears slightly more prominent which is likely technical. There is no evidence of pneumothorax or acute bony abnormality. IMPRESSION: Little significant change in appearance of the chest with pulmonary vascular congestion, bilateral pleural effusions and unchanged bilateral pulmonary opacities. Electronically Signed   By: Margarette Canada M.D.   On: 12/13/2021 10:49   DG Chest 2 View  Result Date: 12/10/2021 CLINICAL DATA:  Pleural effusions, weight gain pain, edema, CHF EXAM: CHEST - 2 VIEW COMPARISON:  11/19/2021 FINDINGS: Upper normal heart size. Atherosclerotic calcification and mild tortuosity of thoracic aorta. Pulmonary  vascularity normal. Extensive scarring in the mid lungs bilaterally with BILATERAL pleural effusions, larger on RIGHT. Bibasilar infiltrates and atelectasis. Nodular focus versus atelectasis in the RIGHT upper lobe again identified. Posterior opacity in the mid to lower chest  on lateral view unchanged. No pneumothorax. IMPRESSION: Persistent BILATERAL pleural effusions larger on RIGHT. BILATERAL pulmonary scarring with persistent bibasilar infiltrates and atelectasis. Persistent nodular density or atelectasis at lateral RIGHT upper lobe. Electronically Signed   By: Lavonia Dana M.D.   On: 12/10/2021 14:17    Microbiology: Results for orders placed or performed during the hospital encounter of 12/13/21  Urine Culture     Status: None   Collection Time: 12/14/21  6:41 AM   Specimen: In/Out Cath Urine  Result Value Ref Range Status   Specimen Description IN/OUT CATH URINE  Final   Special Requests NONE  Final   Culture   Final    NO GROWTH Performed at [facility], [place] [address], [place], [place] [address]    Report Status 12/15/2021 FINAL  Final  Urine Culture     Status: None   Collection Time: 12/19/21  7:22 AM   Specimen: Urine, Catheterized  Result Value Ref Range Status   Specimen Description URINE, CATHETERIZED  Final   Special Requests NONE  Final   Culture   Final    NO GROWTH Performed at [facility] [address], [place], [place] [address]    Report Status 12/20/2021 FINAL  Final    Labs: CBC: Recent Labs  Lab 12/19/21 0229 12/20/21 0350 12/21/21 0325 12/22/21 0306 12/23/21 0332  WBC 12.2* 9.2 11.0* 12.3* 11.9*  HGB 8.0* 7.4* 7.6* 7.4* 7.8*  HCT 24.3* 23.7* 24.5* 23.4* 24.7*  MCV 86.2 87.1 87.8 88.3 90.5  PLT 272 271 264 254 500   Basic Metabolic Panel: Recent Labs  Lab 12/19/21 0229 12/20/21 0350 12/21/21 0325 12/22/21 0306 12/23/21 0332  NA 131* 134* 133* 134* 133*  K 4.4 3.7 4.8 4.3 4.7  CL 92* 95* 94* 94* 91*  CO2 $Re'25 26 25 27 27   'zhC$ GLUCOSE 139* 148* 138* 144* 177*  BUN 103* 97* 99* 92* 101*  CREATININE 5.03* 4.72* 4.92* 4.71* 5.20*  CALCIUM 8.5* 8.6* 8.4* 8.4* 8.8*  PHOS 7.0* 6.5* 7.6* 6.6* 7.3*   Liver Function Tests: Recent Labs  Lab 12/19/21 0229 12/20/21 0350 12/21/21 0325 12/22/21 0306 12/23/21 0332  ALBUMIN 3.0* 2.8* 2.9* 2.9* 3.2*   CBG: Recent Labs  Lab 12/22/21 0617 12/22/21 1112 12/22/21 1601 12/22/21 2125 12/23/21 0554  GLUCAP 122* 230* 210* 257* 217*    Discharge time spent: greater than 30 minutes.  Signed: Deatra James, MD [facility] 12/23/2021

## 2021-12-24 DIAGNOSIS — R627 Adult failure to thrive: Secondary | ICD-10-CM | POA: Diagnosis not present

## 2021-12-24 DIAGNOSIS — N179 Acute kidney failure, unspecified: Secondary | ICD-10-CM | POA: Diagnosis not present

## 2021-12-24 DIAGNOSIS — I132 Hypertensive heart and chronic kidney disease with heart failure and with stage 5 chronic kidney disease, or end stage renal disease: Secondary | ICD-10-CM | POA: Diagnosis not present

## 2021-12-24 DIAGNOSIS — I509 Heart failure, unspecified: Secondary | ICD-10-CM | POA: Diagnosis not present

## 2021-12-24 DIAGNOSIS — N19 Unspecified kidney failure: Secondary | ICD-10-CM

## 2021-12-24 LAB — RENAL FUNCTION PANEL
Albumin: 3.2 g/dL — ABNORMAL LOW (ref 3.5–5.0)
Anion gap: 16 — ABNORMAL HIGH (ref 5–15)
BUN: 104 mg/dL — ABNORMAL HIGH (ref 8–23)
CO2: 24 mmol/L (ref 22–32)
Calcium: 8.4 mg/dL — ABNORMAL LOW (ref 8.9–10.3)
Chloride: 90 mmol/L — ABNORMAL LOW (ref 98–111)
Creatinine, Ser: 5.11 mg/dL — ABNORMAL HIGH (ref 0.61–1.24)
GFR, Estimated: 11 mL/min — ABNORMAL LOW (ref >60)
Glucose, Bld: 127 mg/dL — ABNORMAL HIGH (ref 70–99)
Phosphorus: 7 mg/dL — ABNORMAL HIGH (ref 2.5–4.6)
Potassium: 4 mmol/L (ref 3.5–5.1)
Sodium: 130 mmol/L — ABNORMAL LOW (ref 135–145)

## 2021-12-24 LAB — CBC
HCT: 24.8 % — ABNORMAL LOW (ref 39.0–52.0)
Hemoglobin: 7.6 g/dL — ABNORMAL LOW (ref 13.0–17.0)
MCH: 27.4 pg (ref 26.0–34.0)
MCHC: 30.6 g/dL (ref 30.0–36.0)
MCV: 89.5 fL (ref 80.0–100.0)
Platelets: 267 10*3/uL (ref 150–400)
RBC: 2.77 MIL/uL — ABNORMAL LOW (ref 4.22–5.81)
RDW: 18 % — ABNORMAL HIGH (ref 11.5–15.5)
WBC: 10.7 10*3/uL — ABNORMAL HIGH (ref 4.0–10.5)
nRBC: 0.2 % (ref 0.0–0.2)

## 2021-12-24 LAB — GLUCOSE, CAPILLARY
Glucose-Capillary: 250 mg/dL — ABNORMAL HIGH (ref 70–99)
Glucose-Capillary: 164 mg/dL — ABNORMAL HIGH (ref 70–99)
Glucose-Capillary: 159 mg/dL — ABNORMAL HIGH (ref 70–99)
Glucose-Capillary: 143 mg/dL — ABNORMAL HIGH (ref 70–99)
Glucose-Capillary: 240 mg/dL — ABNORMAL HIGH (ref 70–99)

## 2021-12-24 MED ORDER — TORSEMIDE 20 MG PO TABS
40.0000 mg | ORAL_TABLET | Freq: Two times a day (BID) | ORAL | Status: DC
  Administered 2021-12-24 – 2021-12-31 (×14): 40 mg via ORAL
  Filled 2021-12-24 (×14): qty 2

## 2021-12-24 MED ORDER — LACTULOSE 10 GM/15ML PO SOLN
10.0000 g | Freq: Two times a day (BID) | ORAL | Status: DC
  Administered 2021-12-24 – 2021-12-31 (×14): 10 g via ORAL
  Filled 2021-12-24 (×14): qty 15

## 2021-12-24 MED ORDER — HYDROMORPHONE HCL 1 MG/ML IJ SOLN
0.5000 mg | Freq: Three times a day (TID) | INTRAMUSCULAR | Status: DC
  Administered 2021-12-24 – 2021-12-26 (×5): 0.5 mg via INTRAVENOUS
  Filled 2021-12-24 (×5): qty 0.5

## 2021-12-24 NOTE — Plan of Care (Signed)
  Problem: Education: Goal: Ability to demonstrate management of disease process will improve Outcome: Progressing Goal: Ability to verbalize understanding of medication therapies will improve Outcome: Progressing   Problem: Activity: Goal: Capacity to carry out activities will improve Outcome: Progressing   Problem: Cardiac: Goal: Ability to achieve and maintain adequate cardiopulmonary perfusion will improve Outcome: Progressing   Problem: Health Behavior/Discharge Planning: Goal: Ability to manage health-related needs will improve Outcome: Progressing

## 2021-12-24 NOTE — Progress Notes (Signed)
Patient ID: Adam Reid, male   DOB: [birth year], 76 y.o.   MRN: [id number]  Assessment/Plan:  AKI/CKD stage IV - presumably combination of urinary retention and cardiorenal syndrome in the setting of acute on chronic CHF.  BUN/Cr have reached a plateau.  Good UOP after foley catheter and stopping IV lasix.  No indication for hemodialysis, however we may need to discuss the possible need for HD if his renal function continues to worsen.  He is a marginal candidate for long-term dialysis given all of his co-morbidities.  He normally follows with Dr. [name] at [facility] and will need hospital follow up after discharge.  Avoid nephrotoxic agents such as IV contrast, NSAIDs, phosphorous containing bowel preps.  Renal dose meds for GFR <15. Stopped lasix 80 mg IV daily due to rise in Scr on 12/16/21 but he remains negative with I&O's. At home he is on Torsemide '20mg'$  2 tabs BID and will need to be d/c with the Torsemide '20mg'$  2 tabs BID. Lasix IV '80mg'$  x1 on 6/14 -> 925 ml UOP /24hrs; restart the Torsemide '20mg'$  BID as he will need to be on that at d/c.  He still remains 7.5  liters negative since admission. Son back (optometrist in [place]); pt deferring his care to his son and wife. He still prefers not to do dialysis unless necessary; if needed he will need a TC if needed for HD. With the very guarded prognosis with the paraganglioma I would not advise another access surgery; current fistula is not usable.  Again still no absolute indications for RRT and high likelihood that if he's started on dialysis that it would be permanent. They still would like to hold off with further continued discussion. We can make his numbers look better but unfortunately RRT won't affect prognosis and probably in his case won't improve QOL. We had a good discussion today and ongoing palliative will assist w/ further discussion.  Will need f/u w/ Dr. [name] ([facility]) in 2 weeks as well upon dc. Office aware and they will call him.   Acute on  chronic diastolic CHF - improved.  Palliative care following for worsening cardiac function and FTT.  He is now DNR P. 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  Surgery recommends transfer to tertiary care center given high risk.  Declined for transfer but recommended to start alpha blocking agent.  Will need outpatient follow up. Urinary retention - foley replaced.  Predated hospitalization.  DM type 2 - per primary HTN - stable Anemia of CKD stage IV - follow H/H.  Hold off on ESA given recent diagnosis of paraganglioma.  Will defer management to Oncology.  Given IV iron.  Transfuse for Hgb <7.    S: Dysuria continues but otherwise no new complaints. Son on phone and I answered all his questions. States pain/ burning from foley worse w/ vicodin vs Dilaudid PO.   O:BP (!) 148/68 (BP Location: Left Arm)   Pulse 77   Temp (!) 97.3 F (36.3 C) (Oral)   Resp 18   Ht '5\' 6"'$  (1.676 m)   Wt 53.4 kg   SpO2 96%   BMI 19.00 kg/m   Intake/Output Summary (Last 24 hours) at 12/24/2021 1105 Last data filed at 12/24/2021 0900 Gross per 24 hour  Intake 360 ml  Output 925 ml  Net -565 ml   Intake/Output: I/O last 3 completed shifts: In: 240 [P.O.:240] Out: 1125 [Urine:1125]  Intake/Output this  shift:  Total I/O In: 240 [P.O.:240] Out: -  Weight change: -0.3 kg Gen:NAD CVS: RRR Resp: rales b/l Abd: +BS, soft, NT/ND Ext: no edema GU: foley to gravity  Recent Labs  Lab 12/18/21 0349 12/19/21 0229 12/20/21 0350 12/21/21 0325 12/22/21 0306 12/23/21 0332 12/24/21 0431  NA 132* 131* 134* 133* 134* 133* 130*  K 3.8 4.4 3.7 4.8 4.3 4.7 4.0  CL 95* 92* 95* 94* 94* 91* 90*  CO2 '25 25 26 25 27 27 24  '$ GLUCOSE 112* 139* 148* 138* 144* 177* 127*  BUN 106* 103* 97* 99* 92* 101* 104*  CREATININE 5.04* 5.03* 4.72* 4.92* 4.71* 5.20* 5.11*  ALBUMIN 2.9* 3.0* 2.8* 2.9* 2.9* 3.2* 3.2*  CALCIUM 8.3* 8.5* 8.6* 8.4*  8.4* 8.8* 8.4*  PHOS 7.5* 7.0* 6.5* 7.6* 6.6* 7.3* 7.0*   Liver Function Tests: Recent Labs  Lab 12/22/21 0306 12/23/21 0332 12/24/21 0431  ALBUMIN 2.9* 3.2* 3.2*   No results for input(s): "LIPASE", "AMYLASE" in the last 168 hours. No results for input(s): "AMMONIA" in the last 168 hours. CBC: Recent Labs  Lab 12/20/21 0350 12/21/21 0325 12/22/21 0306 12/23/21 0332 12/24/21 0431  WBC 9.2 11.0* 12.3* 11.9* 10.7*  HGB 7.4* 7.6* 7.4* 7.8* 7.6*  HCT 23.7* 24.5* 23.4* 24.7* 24.8*  MCV 87.1 87.8 88.3 90.5 89.5  PLT 271 264 254 279 267   Cardiac Enzymes: No results for input(s): "CKTOTAL", "CKMB", "CKMBINDEX", "TROPONINI" in the last 168 hours.  CBG: Recent Labs  Lab 12/23/21 1158 12/23/21 1624 12/23/21 2059 12/24/21 0606 12/24/21 0728  GLUCAP 232* 250* 164* 159* 143*    Iron Studies: No results for input(s): "IRON", "TIBC", "TRANSFERRIN", "FERRITIN" in the last 72 hours. Studies/Results: No results found.  amiodarone  200 mg Oral Daily   apixaban  2.5 mg Oral BID   atorvastatin  80 mg Oral Daily   calcium acetate  667 mg Oral TID with meals   Chlorhexidine Gluconate Cloth  6 each Topical Daily   citalopram  20 mg Oral Daily   diltiazem  180 mg Oral Daily   doxazosin  4 mg Oral Daily   finasteride  5 mg Oral Daily    HYDROmorphone (DILAUDID) injection  0.5 mg Intravenous Once   insulin aspart  0-6 Units Subcutaneous TID WC   linagliptin  5 mg Oral Daily   polyethylene glycol  17 g Oral Daily   senna-docusate  1 tablet Oral BID   sodium bicarbonate  650 mg Oral BID   sodium chloride flush  3 mL Intravenous Q12H    BMET    Component Value Date/Time   NA 130 (L) 12/24/2021 0431   NA 137 06/19/2020 0936   K 4.0 12/24/2021 0431   CL 90 (L) 12/24/2021 0431   CO2 24 12/24/2021 0431   GLUCOSE 127 (H) 12/24/2021 0431   BUN 104 (H) 12/24/2021 0431   BUN 51 (H) 06/19/2020 0936   CREATININE 5.11 (H) 12/24/2021 0431   CREATININE 2.68 (H) 03/26/2016 1839    CALCIUM 8.4 (L) 12/24/2021 0431   GFRNONAA 11 (L) 12/24/2021 0431   GFRAA 21 (L) 06/19/2020 0936   CBC    Component Value Date/Time   WBC 10.7 (H) 12/24/2021 0431   RBC 2.77 (L) 12/24/2021 0431   HGB 7.6 (L) 12/24/2021 0431   HGB 9.1 (L) 09/14/2019 1555   HCT 24.8 (L) 12/24/2021 0431   HCT 28.4 (L) 09/14/2019 1555   PLT 267 12/24/2021 0431   PLT 464 (H) 09/14/2019 1555  MCV 89.5 12/24/2021 0431   MCV 82 09/14/2019 1555   MCH 27.4 12/24/2021 0431   MCHC 30.6 12/24/2021 0431   RDW 18.0 (H) 12/24/2021 0431   RDW 16.4 (H) 09/14/2019 1555   LYMPHSABS 0.4 (L) 12/13/2021 1011   LYMPHSABS 0.6 (L) 10/05/2017 1433   MONOABS 0.5 12/13/2021 1011   EOSABS 0.1 12/13/2021 1011   EOSABS 0.1 10/05/2017 1433   BASOSABS 0.1 12/13/2021 1011   BASOSABS 0.0 10/05/2017 1433

## 2021-12-24 NOTE — Progress Notes (Signed)
PROGRESS NOTE    Adam Reid  DZH:[id number] DOB: Nov 10, 1945 DOA: 12/13/2021 PCP: [name], MD  76 y.o. 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 CT demonstrated enlargement of retroperitoneal mass. The patient was found to be retaining urine, so foley catheter was placed and he was admitted for AKI on stage IV CKD with nephrology, cardiology, and palliative care consulted. Patient requested discontinuation of foley but had recurrent retention, so coude catheter was placed 6/6.  -Seen by oncology, recommended evaluation by surgical oncology -Overall prognosis felt to be poor, s/p Palliative care meeting with patient and family 6/6 with determination that the patient would like no restrictions to medical care,  hope to pursue measures with curative intent.  -Dr.Grunz discussed in detail with endocrinology consultant and surgical oncology at [facility] on 6/6 who recommended the work up as ordered and follow up after discharge. Did not recommend inpatient transfer for management of paraganglioma.  -6/8: -> DNR   Subjective: -Continues to have intermittent burning sensation every time that he pees; no chest pain, no nausea, no vomiting.  Decreased oral intake has continued to be an issue.  Deciding regarding dialysis or not.  Assessment and Plan:  Acute on chronic HFpEF: Echo shows LVEF 55-60%, mild basal-septal LVH and grade III diastolic dysfunction, elevated PASP and RV enlargement, IVC dilated. Moderate MR. -Diuresed with IV Lasix initially, held for last 4+ days, creatinine stable around 4.7-4.9, BUN is 99, anticipate need to resume diuretics in 1 to 2 days -Continues to have fair urine output, frequent hospitalizations with fluid overload in the last few  months -GDMT limited by CKD. -Cardiology signed off, not much further to offer   AKI on stage IV CKD:  -Secondary to cardiorenal syndrome and urinary retention -Now back with a coud catheter for recurrent retention -Nephrology following, diuresed initially, subsequently diuretics on hold, BUN and creatinine with mild worsening now stable, has some marginal uremic symptoms as well, urine output is still fair, anticipate need for dialysis in the near future, palliative care following as well, patient reportedly now does not want to do dialysis at this time but seems open to this in the future, advised son to kindly clarify this -Overall prognosis is poor, palliative care discussed with patients family again 6/11, he has deferred all decisions to his wife and son, now DNR -Per nephrology; overall appears to be a very poor candidate for hemodialysis.  acute urinary retention: BPH noted on prior imaging -On admission and required Foley catheter for retention and AKI , patient requested discontinuation of foley due to pain despite lidocaine and oral medications.  -Developed recurrent retention,  coud catheter was placed on 6/6  -started on Cardura in the setting of paraganglioma. -6/10 AM with dysuria, coud catheter changed 6/10, symptoms improving, UA with scant hematuria-no s/s of infection. -Continue as needed analgesics.  Retroperitoneal paraganglioma: Enlarging. 6.7 x 5.4 cm less than a month ago, now 6.9 x 6.1 x 7.9 cm. Location abuts the infrarenal aorta.  - D/w oncology who recommended endocrinology and surgical evaluation. Surgery locally was contacted and deferred to tertiary care centers.  Dr. Bonner Puna discussed with Dr. Cam Hai who is an endocrine surgeon at [facility]. He recommended starting doxazosin and continuing this until follow up. He will  have his office reach out to patient's wife to schedule follow up next week. Of note, the enlargement seen on repeat CT may be related to  bleeding from biopsy and not true tumor proliferation. Dr.Randle specifically stated he would not recommend any surgical intervention at this time, so transfer for this reason would be futile.  -Dr. Bonner Puna also discussed with endocrinology, Dr. Buddy Duty, who confirmed work up with urine metanephrines, urine catecholamines, and plasma metanephrines is the appropriate next step in hopes of determining whether this is functioning vs. not hormonally actively functioning tumor.  Free metanephrines significantly elevated at 9661, continue alpha-blocker, endocrinology recommended follow-up after discharge -I have called [facility] transfer line today at 12:20 PM and requested a call back from endocrine surgeon per family request -Addendum: 2:50 PM, called back by [facility] transfer line, endocrine surgery will not be accepting inpatient transfer for paraganglioma and they do not have beds as well, recommended outpatient follow-up if needed. -Family will discuss with endocrinology regarding treatment options and decide to pursue it or not.   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  - No ESA with malignancy. Of note, had an appointment scheduled as outpatient for feraheme and retacrit on 6/7. -Given IV iron 6/6, 6/7 -Hemoglobin stable in the 7 range, given repeat IV iron 6/11-12 -No overt bleeding.  Continue to follow hemoglobin trend.   T2DM:  -CBGs now stable - Very sensitive SSI given his malnutrition and renal impairment.  -Continue to follow CBGs fluctuation with further adjustment to hypoglycemic regimen as required.  Constipation -Patient with very little intake -Following family request started on lactulose (patient has used these medications in the past)..   Moderate protein calorie malnutrition: Body mass index is 18.54 kg/m. Suspect significant catabolic state given enlarging mass with central necrosis.  -Continue protein supplements    PAF/flutter:  -Currently in NSR.  - Continue eliquis - Continue diltiazem, amiodarone   CAD, aortic atherosclerosis: - Continue statin, receiving Eliquis. -No chest pain.   Depression: Quiescent - Continue SSRI  Goals of care discussions -Overall prognosis is felt to be poor in the setting of CHF, CKD4-5, severe malnutrition, large retroperitoneal paraganglioma, ongoing failure to thrive.   -Discussed poor prognosis with the patient and patient's son at bedside on 12/24/2021  -Family planning to further discuss about decision regarding dialysis or not.   -Palliative care on board -CODE STATUS DNR.    DVT prophylaxis: Apixaban Code Status: DNR, after d/w wife Family Communication: Updated son at bedside today Disposition Plan: To be determined  Consultants: Nephrology, cardiology palliative care.   Procedures:   Antimicrobials:    Objective: Vitals:   12/24/21 0421 12/24/21 0428 12/24/21 0749 12/24/21 1639  BP: 132/62  (!) 148/68 127/65  Pulse: 77   79  Resp: '17  18 11  '$ Temp: 98.1 F (36.7 C)  (!) 97.3 F (36.3 C)   TempSrc: Oral  Oral   SpO2: 96%   95%  Weight:  53.4 kg    Height:        Intake/Output Summary (Last 24 hours) at 12/24/2021 1706 Last data filed at 12/24/2021 1305 Gross per 24 hour  Intake 480 ml  Output 1625 ml  Net -1145 ml   Filed Weights   12/22/21 0521 12/23/21 0428 12/24/21 0428  Weight: 53.4 kg 53.7 kg 53.4 kg    Examination: General exam: Chronically ill in appearance, underweight, reporting decreased appetite and is slightly somnolent after pain medications.  No  fever. Respiratory system: Decreased breath sounds at the bases, no using accessory muscle.  Good saturation on 2 L nasal cannula supplementation. Cardiovascular system: Rate controlled, no rubs, no gallops, no JVD. Gastrointestinal system: Abdomen is nondistended, soft and nontender. No organomegaly or masses felt. Normal bowel sounds heard. Central nervous system: Alert  and oriented. No focal neurological deficits. Extremities: No cyanosis, no clubbing.  No edema appreciated. Skin: No petechiae.  Foley catheter in place. Psychiatry: Judgement and insight appear intact; flat affect.   Data Reviewed:   CBC: Recent Labs  Lab 12/20/21 0350 12/21/21 0325 12/22/21 0306 12/23/21 0332 12/24/21 0431  WBC 9.2 11.0* 12.3* 11.9* 10.7*  HGB 7.4* 7.6* 7.4* 7.8* 7.6*  HCT 23.7* 24.5* 23.4* 24.7* 24.8*  MCV 87.1 87.8 88.3 90.5 89.5  PLT 271 264 254 279 536   Basic Metabolic Panel: Recent Labs  Lab 12/20/21 0350 12/21/21 0325 12/22/21 0306 12/23/21 0332 12/24/21 0431  NA 134* 133* 134* 133* 130*  K 3.7 4.8 4.3 4.7 4.0  CL 95* 94* 94* 91* 90*  CO2 '26 25 27 27 24  '$ GLUCOSE 148* 138* 144* 177* 127*  BUN 97* 99* 92* 101* 104*  CREATININE 4.72* 4.92* 4.71* 5.20* 5.11*  CALCIUM 8.6* 8.4* 8.4* 8.8* 8.4*  PHOS 6.5* 7.6* 6.6* 7.3* 7.0*   GFR: Estimated Creatinine Clearance: 9.6 mL/min (A) (by C-G formula based on SCr of 5.11 mg/dL (H)).  Liver Function Tests: Recent Labs  Lab 12/20/21 0350 12/21/21 0325 12/22/21 0306 12/23/21 0332 12/24/21 0431  ALBUMIN 2.8* 2.9* 2.9* 3.2* 3.2*   BNP (last 3 results) Recent Labs    12/10/21 1339  PROBNP 12,645*   CBG: Recent Labs  Lab 12/23/21 1624 12/23/21 2059 12/24/21 0606 12/24/21 0728 12/24/21 1135  GLUCAP 250* 164* 159* 143* 240*   Urine analysis:    Component Value Date/Time   COLORURINE YELLOW 12/19/2021 1423   APPEARANCEUR HAZY (A) 12/19/2021 1423   LABSPEC 1.011 12/19/2021 1423   PHURINE 6.0 12/19/2021 1423   GLUCOSEU NEGATIVE 12/19/2021 1423   HGBUR LARGE (A) 12/19/2021 1423   BILIRUBINUR NEGATIVE 12/19/2021 1423   BILIRUBINUR negative 03/02/2021 1458   BILIRUBINUR neg 03/23/2015 1404   KETONESUR NEGATIVE 12/19/2021 1423   PROTEINUR 100 (A) 12/19/2021 1423   UROBILINOGEN 0.2 03/02/2021 1458   NITRITE NEGATIVE 12/19/2021 1423   LEUKOCYTESUR TRACE (A) 12/19/2021 1423   Sepsis  Labs:  Recent Results (from the past 240 hour(s))  Urine Culture     Status: None   Collection Time: 12/19/21  7:22 AM   Specimen: Urine, Catheterized  Result Value Ref Range Status   Specimen Description URINE, CATHETERIZED  Final   Special Requests NONE  Final   Culture   Final    NO GROWTH Performed at [facility], [place] [address], [place], [place] [address]    Report Status 12/20/2021 FINAL  Final     Radiology Studies: No results found.   Scheduled Meds:  amiodarone  200 mg Oral Daily   apixaban  2.5 mg Oral BID   atorvastatin  80 mg Oral Daily   calcium acetate  667 mg Oral TID with meals   Chlorhexidine Gluconate Cloth  6 each Topical Daily   citalopram  20 mg Oral Daily   diltiazem  180 mg Oral Daily   doxazosin  4 mg Oral Daily   finasteride  5 mg Oral Daily    HYDROmorphone (DILAUDID) injection  0.5 mg Intravenous Once    HYDROmorphone (DILAUDID) injection  0.5 mg Intravenous Q8H   insulin aspart  0-6 Units Subcutaneous TID WC   lactulose  10 g Oral BID   linagliptin  5 mg Oral Daily   polyethylene glycol  17 g Oral Daily   senna-docusate  1 tablet Oral BID   sodium bicarbonate  650 mg Oral BID   sodium chloride flush  3 mL Intravenous Q12H   torsemide  40 mg Oral BID   Continuous Infusions:  sodium chloride     promethazine (PHENERGAN) injection (IM or IVPB)       LOS: 11 days    Barton Dubois MD  12/24/2021, 5:06 PM

## 2021-12-24 NOTE — Progress Notes (Addendum)
Patient UN:GBMBOM Magadan      DOB: 1945-10-12      QTT:[id number]      Palliative Medicine Team    Subjective: Bedside symptom check completed. No family or visitors present at time of visit. Farsi teleinterpreter used for duration of visit.    Physical exam: Patient resting in bed with eyes open at time of visit. Breathing even and non-labored, no excessive secretions noted. Patient without physical or non-verbal signs of pain or discomfort at this time.    Assessment and plan: This RN introduced self and role on the Palliative Medicine Team. The patient endorses comfort and currently has no pain. He stated that he is waiting to hear from the attending MD for plan of care moving forward. This RN touched base with bedside RN, she has no needs at this time. This RN will reach out to MD [facility] for clarification and to offer ongoing support from our team. At this time, discussion ongoing with family regarding goals of care while remaining inpatient today, will follow along for advances or changes.    Thank you for allowing the Palliative Medicine Team to assist in the care of this patient.     Damian Leavell, MSN, RN Palliative Medicine Team Team Phone: [phone]  This phone is monitored 7a-7p, please reach out to attending physician outside of these hours for urgent needs.

## 2021-12-24 NOTE — TOC Progression Note (Signed)
Transition of Care [facility]) - Progression Note    Patient Details  Name: Adam Reid MRN: [id number] Date of Birth: March 24, 1946  Transition of Care [facility]) CM/SW Contact  Zenon Mayo, RN Phone Number: 12/24/2021, 2:29 PM  Clinical Narrative:    Patient with acuter/chronic CHF, afib, paraganglioma, urinary retention, anemia of ckd stage IV.  Per MD, patient is trying to decide if he want dialysis or not, if he decides yes a temporary cath will be placed if dialysis is needed.  TOC will continue to follow for dc needs.    Expected Discharge Plan: Home/Self Care Barriers to Discharge: Continued Medical Work up  Expected Discharge Plan and Services Expected Discharge Plan: Home/Self Care   Discharge Planning Services: CM Consult   Living arrangements for the past 2 months: Single Family Home Expected Discharge Date: 12/24/21                 DME Agency: NA       HH Arranged: NA           Social Determinants of Health (SDOH) Interventions    Readmission Risk Interventions    12/15/2021    9:03 AM 11/16/2021    3:15 PM  Readmission Risk Prevention Plan  Transportation Screening Complete Complete  PCP or Specialist Appt within 3-5 Days Complete Complete  HRI or Home Care Consult Complete Complete  Social Work Consult for [place] Planning/Counseling Complete Complete  Palliative Care Screening Not Applicable Not Applicable  Medication Review [employer]) Complete Complete

## 2021-12-25 DIAGNOSIS — I132 Hypertensive heart and chronic kidney disease with heart failure and with stage 5 chronic kidney disease, or end stage renal disease: Secondary | ICD-10-CM | POA: Diagnosis not present

## 2021-12-25 DIAGNOSIS — I509 Heart failure, unspecified: Secondary | ICD-10-CM | POA: Diagnosis not present

## 2021-12-25 DIAGNOSIS — R627 Adult failure to thrive: Secondary | ICD-10-CM | POA: Diagnosis not present

## 2021-12-25 DIAGNOSIS — N179 Acute kidney failure, unspecified: Secondary | ICD-10-CM | POA: Diagnosis not present

## 2021-12-25 LAB — RENAL FUNCTION PANEL
Albumin: 3.2 g/dL — ABNORMAL LOW (ref 3.5–5.0)
Anion gap: 16 — ABNORMAL HIGH (ref 5–15)
BUN: 108 mg/dL — ABNORMAL HIGH (ref 8–23)
CO2: 27 mmol/L (ref 22–32)
Calcium: 8.6 mg/dL — ABNORMAL LOW (ref 8.9–10.3)
Chloride: 89 mmol/L — ABNORMAL LOW (ref 98–111)
Creatinine, Ser: 5.19 mg/dL — ABNORMAL HIGH (ref 0.61–1.24)
GFR, Estimated: 11 mL/min — ABNORMAL LOW (ref >60)
Glucose, Bld: 199 mg/dL — ABNORMAL HIGH (ref 70–99)
Phosphorus: 6.9 mg/dL — ABNORMAL HIGH (ref 2.5–4.6)
Potassium: 4.6 mmol/L (ref 3.5–5.1)
Sodium: 132 mmol/L — ABNORMAL LOW (ref 135–145)

## 2021-12-25 LAB — CBC
HCT: 23.9 % — ABNORMAL LOW (ref 39.0–52.0)
Hemoglobin: 7.7 g/dL — ABNORMAL LOW (ref 13.0–17.0)
MCH: 28.1 pg (ref 26.0–34.0)
MCHC: 32.2 g/dL (ref 30.0–36.0)
MCV: 87.2 fL (ref 80.0–100.0)
Platelets: 261 10*3/uL (ref 150–400)
RBC: 2.74 MIL/uL — ABNORMAL LOW (ref 4.22–5.81)
RDW: 17.6 % — ABNORMAL HIGH (ref 11.5–15.5)
WBC: 9.5 10*3/uL (ref 4.0–10.5)
nRBC: 0 % (ref 0.0–0.2)

## 2021-12-25 LAB — GLUCOSE, CAPILLARY
Glucose-Capillary: 70 mg/dL (ref 70–99)
Glucose-Capillary: 210 mg/dL — ABNORMAL HIGH (ref 70–99)
Glucose-Capillary: 210 mg/dL — ABNORMAL HIGH (ref 70–99)
Glucose-Capillary: 149 mg/dL — ABNORMAL HIGH (ref 70–99)
Glucose-Capillary: 235 mg/dL — ABNORMAL HIGH (ref 70–99)

## 2021-12-25 NOTE — Progress Notes (Signed)
PROGRESS NOTE    Adam Reid  XBD:[id number] DOB: 05/26/1946 DOA: 12/13/2021 PCP: [name], MD  76 y.o. 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 CT demonstrated enlargement of retroperitoneal mass. The patient was found to be retaining urine, so foley catheter was placed and he was admitted for AKI on stage IV CKD with nephrology, cardiology, and palliative care consulted. Patient requested discontinuation of foley but had recurrent retention, so coude catheter was placed 6/6.  -Seen by oncology, recommended evaluation by surgical oncology -Overall prognosis felt to be poor, s/p Palliative care meeting with patient and family 6/6 with determination that the patient would like no restrictions to medical care,  hope to pursue measures with curative intent.  -Dr.Grunz discussed in detail with endocrinology consultant and surgical oncology at [facility] on 6/6 who recommended the work up as ordered and follow up after discharge. Did not recommend inpatient transfer for management of paraganglioma.  -6/8: -> DNR   Subjective: -No fever, no chest pain, no nausea, no vomiting. Poor oral intake/appetite. Overall more comfortable after initiation of schedule dilaudid. -increased urine output seen after resumption of diuretics.  Assessment and Plan:  Acute on chronic HFpEF: Echo shows LVEF 55-60%, mild basal-septal LVH and grade III diastolic dysfunction, elevated PASP and RV enlargement, IVC dilated. Moderate MR. -Diuresed with IV Lasix initially, held for last 4+ days, due to worsening in his renal function. -now with resumption of diuretics as per nephrology rec's. -Continues to have fair urine output, frequent hospitalizations with fluid overload in the last few  months -GDMT limited by CKD. -Cardiology signed off, not much further to offer   AKI on stage IV CKD:  -Secondary to cardiorenal syndrome and urinary retention -Now back with a coud catheter for recurrent retention -Nephrology following, diuresed initially, subsequently diuretics on hold, BUN and creatinine with mild worsening now stable, has some marginal uremic symptoms as well, urine output is still fair, anticipate need for dialysis in the near future, palliative care following as well, patient reportedly now does not want to do dialysis at this time but seems open to this in the future, advised son to kindly clarify this -Overall prognosis is poor, palliative care discussed with patients family again 6/11, he has deferred all decisions to his wife and son, now DNR -Per nephrology; overall appears to be a very poor candidate for hemodialysis.  acute urinary retention: BPH noted on prior imaging -On admission and required Foley catheter for retention and AKI , patient requested discontinuation of foley due to pain despite lidocaine and oral medications.  -Developed recurrent retention,  coud catheter was placed on 6/6  -started on Cardura in the setting of paraganglioma. -6/10 AM with dysuria, coud catheter changed 6/10, symptoms improving, UA with scant hematuria-no s/s of infection. -Continue as needed analgesics.  Retroperitoneal paraganglioma: Enlarging. 6.7 x 5.4 cm less than a month ago, now 6.9 x 6.1 x 7.9 cm. Location abuts the infrarenal aorta.  - D/w oncology who recommended endocrinology and surgical evaluation. Surgery locally was contacted and deferred to tertiary care centers.  Dr. Bonner Puna discussed with Dr. Cam Hai who is an endocrine surgeon at [facility]. He recommended starting doxazosin and continuing this until follow up. He will have his office reach out to patient's  wife to schedule follow up next week. Of note, the enlargement seen on repeat CT may be related to  bleeding from biopsy and not true tumor proliferation. Dr.Randle specifically stated he would not recommend any surgical intervention at this time, so transfer for this reason would be futile.  -Dr. Bonner Puna also discussed with endocrinology, Dr. Buddy Duty, who confirmed work up with urine metanephrines, urine catecholamines, and plasma metanephrines is the appropriate next step in hopes of determining whether this is functioning vs. not hormonally actively functioning tumor.  Free metanephrines significantly elevated at 9661, continue alpha-blocker, endocrinology recommended follow-up after discharge -[facility] decline offering any service. -[facility] Dr. Scarlette Slice was looking to see patient in the outpatient setting. Patient is very weak and declining and family will like their honest opinion regarding feasible chemo intervention. Otherwise they will like to go home with hospice and focus on comfort care.   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  - No ESA with malignancy. Of note, had an appointment scheduled as outpatient for feraheme and retacrit on 6/7. -Given IV iron 6/6, 6/7, 6/11, 6/12; continue to follow hemoglobin trend and transfuse for hemoglobin less than 7. -No overt bleeding.   T2DM:  -CBGs now stable - Very sensitive SSI given his malnutrition and renal impairment.  -Continue to follow CBGs fluctuation with further adjustment to hypoglycemic regimen as required.  Constipation -Patient with very little intake -Following family request started on lactulose; reporting small bowel movement overnight. -No nausea, no vomiting, no abdominal pain.   Moderate protein calorie malnutrition: Body mass index is 18.54 kg/m. Suspect significant catabolic state given enlarging mass with central necrosis.  -Continue protein supplements and supportive care.   PAF/flutter:  -Currently in NSR.  - Continue eliquis - Continue diltiazem,  amiodarone -Continue telemetry monitoring.   CAD, aortic atherosclerosis: -Continue statin, receiving Eliquis. -No chest pain. -Reports no shortness of breath; good saturation on 2 L  supplementation.   Depression: Quiescent - Continue SSRI -No hallucinations; flat affect.  Stable mood.  Goals of care discussions -Overall prognosis is felt to be poor in the setting of CHF, CKD4-5, severe malnutrition, large retroperitoneal paraganglioma, ongoing failure to thrive.   -Discussed poor prognosis with the patient and patient's son at bedside on 12/24/2021  -After further discussing with patient and family has decided not to pursued hemodialysis. -They would like final input/recommendation from endocrinologist/oncologist at [facility] (unfortunately unable to reach out to him today). -Palliative care on board; will follow further recommendations. -CODE STATUS DNR.    DVT prophylaxis: Apixaban Code Status: DNR, after d/w wife Family Communication: Updated son at bedside today 12/25/21 Disposition Plan: To be determined  Consultants: Nephrology, cardiology palliative care.   Procedures:   Antimicrobials:    Objective: Vitals:   12/24/21 2122 12/25/21 0532 12/25/21 0718 12/25/21 1600  BP: 126/64 116/65 (!) 115/52 117/62  Pulse: 71 68 97 66  Resp: '18 18 15 15  '$ Temp: 98.4 F (36.9 C) 98.6 F (37 C)  97.8 F (36.6 C)  TempSrc: Oral Oral  Oral  SpO2: 96% 94% 90% 94%  Weight:  54 kg    Height:        Intake/Output Summary (Last 24 hours) at 12/25/2021 1644 Last data filed at 12/25/2021 1445 Gross per 24 hour  Intake 480 ml  Output 1550 ml  Net -1070 ml   Filed Weights   12/23/21 0428 12/24/21 0428 12/25/21 0532  Weight: 53.7 kg 53.4 kg 54 kg  Examination: General exam: Alert, awake, oriented x 3; reports having a small bowel movement overnight; no chest pain, no nausea, no vomiting, no palpitations.  Decreased appetite ongoing.  Overall feels more comfortable and  expressing no significant dysuria.  Foley catheter in place. Respiratory system: Good saturation on 2 L supplementation; decreased breath sounds at the bases at, no frank crackles, no wheezing. Cardiovascular system:Rate controlled, no rubs, no gallops, no JVD, no murmurs on exam.   Gastrointestinal system: Abdomen is nondistended, soft and nontender. No organomegaly or masses felt. Normal bowel sounds heard. Central nervous system: Alert and oriented. No focal neurological deficits. Extremities: No cyanosis or clubbing; no edema. Skin: No petechiae.  Foley catheter in place; no hematuria appreciated.  Good urine output appreciated. Psychiatry: Judgement and insight appear normal.  Flat affect.  Data Reviewed:   CBC: Recent Labs  Lab 12/21/21 0325 12/22/21 0306 12/23/21 0332 12/24/21 0431 12/25/21 0335  WBC 11.0* 12.3* 11.9* 10.7* 9.5  HGB 7.6* 7.4* 7.8* 7.6* 7.7*  HCT 24.5* 23.4* 24.7* 24.8* 23.9*  MCV 87.8 88.3 90.5 89.5 87.2  PLT 264 254 279 267 376   Basic Metabolic Panel: Recent Labs  Lab 12/21/21 0325 12/22/21 0306 12/23/21 0332 12/24/21 0431 12/25/21 0335  NA 133* 134* 133* 130* 132*  K 4.8 4.3 4.7 4.0 4.6  CL 94* 94* 91* 90* 89*  CO2 '25 27 27 24 27  '$ GLUCOSE 138* 144* 177* 127* 199*  BUN 99* 92* 101* 104* 108*  CREATININE 4.92* 4.71* 5.20* 5.11* 5.19*  CALCIUM 8.4* 8.4* 8.8* 8.4* 8.6*  PHOS 7.6* 6.6* 7.3* 7.0* 6.9*   GFR: Estimated Creatinine Clearance: 9.5 mL/min (A) (by C-G formula based on SCr of 5.19 mg/dL (H)).  Liver Function Tests: Recent Labs  Lab 12/21/21 0325 12/22/21 0306 12/23/21 0332 12/24/21 0431 12/25/21 0335  ALBUMIN 2.9* 2.9* 3.2* 3.2* 3.2*   BNP (last 3 results) Recent Labs    12/10/21 1339  PROBNP 12,645*   CBG: Recent Labs  Lab 12/24/21 1630 12/24/21 2027 12/24/21 2132 12/25/21 0625 12/25/21 1132  GLUCAP 70 210* 210* 149* 235*   Urine analysis:    Component Value Date/Time   COLORURINE YELLOW 12/19/2021 1423    APPEARANCEUR HAZY (A) 12/19/2021 1423   LABSPEC 1.011 12/19/2021 1423   PHURINE 6.0 12/19/2021 1423   GLUCOSEU NEGATIVE 12/19/2021 1423   HGBUR LARGE (A) 12/19/2021 1423   BILIRUBINUR NEGATIVE 12/19/2021 1423   BILIRUBINUR negative 03/02/2021 1458   BILIRUBINUR neg 03/23/2015 1404   KETONESUR NEGATIVE 12/19/2021 1423   PROTEINUR 100 (A) 12/19/2021 1423   UROBILINOGEN 0.2 03/02/2021 1458   NITRITE NEGATIVE 12/19/2021 1423   LEUKOCYTESUR TRACE (A) 12/19/2021 1423   Sepsis Labs:  Recent Results (from the past 240 hour(s))  Urine Culture     Status: None   Collection Time: 12/19/21  7:22 AM   Specimen: Urine, Catheterized  Result Value Ref Range Status   Specimen Description URINE, CATHETERIZED  Final   Special Requests NONE  Final   Culture   Final    NO GROWTH Performed at [facility], [place] [address], [place], [place] [address]    Report Status 12/20/2021 FINAL  Final     Radiology Studies: No results found.   Scheduled Meds:  amiodarone  200 mg Oral Daily   apixaban  2.5 mg Oral BID   atorvastatin  80 mg Oral Daily   calcium acetate  667 mg Oral TID with meals   Chlorhexidine Gluconate Cloth  6  each Topical Daily   citalopram  20 mg Oral Daily   diltiazem  180 mg Oral Daily   doxazosin  4 mg Oral Daily   finasteride  5 mg Oral Daily    HYDROmorphone (DILAUDID) injection  0.5 mg Intravenous Once    HYDROmorphone (DILAUDID) injection  0.5 mg Intravenous Q8H   insulin aspart  0-6 Units Subcutaneous TID WC   lactulose  10 g Oral BID   linagliptin  5 mg Oral Daily   polyethylene glycol  17 g Oral Daily   senna-docusate  1 tablet Oral BID   sodium bicarbonate  650 mg Oral BID   sodium chloride flush  3 mL Intravenous Q12H   torsemide  40 mg Oral BID   Continuous Infusions:  sodium chloride     promethazine (PHENERGAN) injection (IM or IVPB)       LOS: 12 days    Barton Dubois MD  12/25/2021, 4:44 PM

## 2021-12-25 NOTE — Progress Notes (Signed)
Patient ID: Adam Reid, male   DOB: 11-01-1945, 76 y.o.   MRN: [id number]  Assessment/Plan:  AKI/CKD stage IV - presumably combination of urinary retention and cardiorenal syndrome in the setting of acute on chronic CHF.  BUN/Cr have reached a plateau.  Good UOP after foley catheter and stopping IV lasix.  No indication for hemodialysis, however we may need to discuss the possible need for HD if his renal function continues to worsen.  He is a marginal candidate for long-term dialysis given all of his co-morbidities.  He normally follows with Dr. [name] at [facility] and will need hospital follow up after discharge.  Avoid nephrotoxic agents such as IV contrast, NSAIDs, phosphorous containing bowel preps.  Renal dose meds for GFR <15. Stopped lasix 80 mg IV daily due to rise in Scr on 12/16/21 but he remains negative with I&O's. At home he is on Torsemide '20mg'$  2 tabs BID. Lasix IV '80mg'$  x1 on 6/14 -> 925 ml UOP /24hrs; restarted the Torsemide '40mg'$ /'40mg'$  on 6/15 as he will need to be on that at d/c.  He still remains 7.5  liters negative since admission. Son back (optometrist in [place]); pt deferring his care to his son and wife. He still prefers not to do dialysis unless necessary; if needed he will need a TC if needed for HD. With the very guarded prognosis with the paraganglioma I would not advise another access surgery; current fistula is not usable.  Again still no absolute indications for RRT and high likelihood that if he's started on dialysis that it would be permanent. We spoke again at length this AM after the son discussed with his father overnight. The father does not want to start dialysis at this time. He's very deconditioned and unfortunately I'm not sure how the family will take care of him in this state.  Will need f/u w/ Dr. [name] ([facility]) in 2 weeks as well upon dc; he already has an appt set up.     Acute on chronic diastolic CHF - improved.  Palliative care following for worsening cardiac  function and FTT.  He is now DNR P. 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  Surgery recommends transfer to tertiary care center given high risk.  Declined for transfer but recommended to start alpha blocking agent.  Will need outpatient follow up. Urinary retention - foley replaced.  Predated hospitalization.  DM type 2 - per primary HTN - stable Anemia of CKD stage IV - follow H/H.  Hold off on ESA given recent diagnosis of paraganglioma.  Will defer management to Oncology.  Given IV iron.  Transfuse for Hgb <7.    S: Dysuria continues but otherwise no new complaints. Son bedside and I answered all his questions. States pain/ burning from foley worse w/ vicodin vs Dilaudid PO. A little better appetite overnight and breathing improved as well.  O:BP (!) 115/52   Pulse 97   Temp 98.6 F (37 C) (Oral)   Resp 15   Ht '5\' 6"'$  (1.676 m)   Wt 54 kg   SpO2 90%   BMI 19.21 kg/m   Intake/Output Summary (Last 24 hours) at 12/25/2021 1031 Last data filed at 12/24/2021 2200 Gross per 24 hour  Intake 600 ml  Output 1250 ml  Net -650 ml   Intake/Output: I/O last 3 completed shifts: In: 960 [P.O.:960] Out: 2175 [Urine:2175]  Intake/Output this shift:  No intake/output data recorded. Weight change:  0.6 kg Gen:NAD CVS: RRR Resp: rales b/l Abd: +BS, soft, NT/ND Ext: no edema GU: foley to gravity  Recent Labs  Lab 12/19/21 0229 12/20/21 0350 12/21/21 0325 12/22/21 0306 12/23/21 0332 12/24/21 0431 12/25/21 0335  NA 131* 134* 133* 134* 133* 130* 132*  K 4.4 3.7 4.8 4.3 4.7 4.0 4.6  CL 92* 95* 94* 94* 91* 90* 89*  CO2 '25 26 25 27 27 24 27  '$ GLUCOSE 139* 148* 138* 144* 177* 127* 199*  BUN 103* 97* 99* 92* 101* 104* 108*  CREATININE 5.03* 4.72* 4.92* 4.71* 5.20* 5.11* 5.19*  ALBUMIN 3.0* 2.8* 2.9* 2.9* 3.2* 3.2* 3.2*  CALCIUM 8.5* 8.6* 8.4* 8.4* 8.8* 8.4* 8.6*  PHOS 7.0* 6.5* 7.6* 6.6* 7.3* 7.0* 6.9*    Liver Function Tests: Recent Labs  Lab 12/23/21 0332 12/24/21 0431 12/25/21 0335  ALBUMIN 3.2* 3.2* 3.2*   No results for input(s): "LIPASE", "AMYLASE" in the last 168 hours. No results for input(s): "AMMONIA" in the last 168 hours. CBC: Recent Labs  Lab 12/21/21 0325 12/22/21 0306 12/23/21 0332 12/24/21 0431 12/25/21 0335  WBC 11.0* 12.3* 11.9* 10.7* 9.5  HGB 7.6* 7.4* 7.8* 7.6* 7.7*  HCT 24.5* 23.4* 24.7* 24.8* 23.9*  MCV 87.8 88.3 90.5 89.5 87.2  PLT 264 254 279 267 261   Cardiac Enzymes: No results for input(s): "CKTOTAL", "CKMB", "CKMBINDEX", "TROPONINI" in the last 168 hours.  CBG: Recent Labs  Lab 12/24/21 1135 12/24/21 1630 12/24/21 2027 12/24/21 2132 12/25/21 0625  GLUCAP 240* 70 210* 210* 149*    Iron Studies: No results for input(s): "IRON", "TIBC", "TRANSFERRIN", "FERRITIN" in the last 72 hours. Studies/Results: No results found.  amiodarone  200 mg Oral Daily   apixaban  2.5 mg Oral BID   atorvastatin  80 mg Oral Daily   calcium acetate  667 mg Oral TID with meals   Chlorhexidine Gluconate Cloth  6 each Topical Daily   citalopram  20 mg Oral Daily   diltiazem  180 mg Oral Daily   doxazosin  4 mg Oral Daily   finasteride  5 mg Oral Daily    HYDROmorphone (DILAUDID) injection  0.5 mg Intravenous Once    HYDROmorphone (DILAUDID) injection  0.5 mg Intravenous Q8H   insulin aspart  0-6 Units Subcutaneous TID WC   lactulose  10 g Oral BID   linagliptin  5 mg Oral Daily   polyethylene glycol  17 g Oral Daily   senna-docusate  1 tablet Oral BID   sodium bicarbonate  650 mg Oral BID   sodium chloride flush  3 mL Intravenous Q12H   torsemide  40 mg Oral BID    BMET    Component Value Date/Time   NA 132 (L) 12/25/2021 0335   NA 137 06/19/2020 0936   K 4.6 12/25/2021 0335   CL 89 (L) 12/25/2021 0335   CO2 27 12/25/2021 0335   GLUCOSE 199 (H) 12/25/2021 0335   BUN 108 (H) 12/25/2021 0335   BUN 51 (H) 06/19/2020 0936   CREATININE 5.19 (H)  12/25/2021 0335   CREATININE 2.68 (H) 03/26/2016 1839   CALCIUM 8.6 (L) 12/25/2021 0335   GFRNONAA 11 (L) 12/25/2021 0335   GFRAA 21 (L) 06/19/2020 0936   CBC    Component Value Date/Time   WBC 9.5 12/25/2021 0335   RBC 2.74 (L) 12/25/2021 0335   HGB 7.7 (L) 12/25/2021 0335   HGB 9.1 (L) 09/14/2019 1555   HCT 23.9 (L) 12/25/2021 0335   HCT 28.4 (L) 09/14/2019  1555   PLT 261 12/25/2021 0335   PLT 464 (H) 09/14/2019 1555   MCV 87.2 12/25/2021 0335   MCV 82 09/14/2019 1555   MCH 28.1 12/25/2021 0335   MCHC 32.2 12/25/2021 0335   RDW 17.6 (H) 12/25/2021 0335   RDW 16.4 (H) 09/14/2019 1555   LYMPHSABS 0.4 (L) 12/13/2021 1011   LYMPHSABS 0.6 (L) 10/05/2017 1433   MONOABS 0.5 12/13/2021 1011   EOSABS 0.1 12/13/2021 1011   EOSABS 0.1 10/05/2017 1433   BASOSABS 0.1 12/13/2021 1011   BASOSABS 0.0 10/05/2017 1433

## 2021-12-25 NOTE — Progress Notes (Addendum)
Patient QM:VHQION Mitch      DOB: 11/20/1945      GEX:[id number]      Palliative Medicine Team    Subjective: Bedside symptom check completed. Patient's son, Mike Craze, present at time of bedside.   Physical exam: Patient resting in bed with eyes open at time of visit. Breathing even and non-labored with nasal cannula applied, no excessive secretions noted. Patient without physical or non-verbal signs of pain or discomfort at this time. Patient denies pain at this time, no symptomatic needs identified.   Assessment and plan: Patient's son waiting to hear back from endocrinologist from [facility] to help guide future decisions on care. At this point patient has expressed he would like to hold off on dialysis, nephrology aware of this and guiding conversations regarding urgency in initiating treatment. Bedside RN Mikle Bosworth with out any needs this morning. Patient and son both without further needs or concerns for palliative team to assist with at this point, will continue to follow for any changes or advances and be available as needed moving forward.    Thank you for allowing the Palliative Medicine Team to assist in the care of this patient.     Damian Leavell, MSN, RN Palliative Medicine Team Team Phone: [phone]  This phone is monitored 7a-7p, please reach out to attending physician outside of these hours for urgent needs.

## 2021-12-25 NOTE — Progress Notes (Signed)
Physical Therapy Treatment Patient Details Name: Adam Reid MRN: [id number] DOB: Aug 11, 1945 Today's Date: 12/25/2021   History of Present Illness The pt is a 76 yo male presenting 6/4 with SOB and hypoxia. Work up revealed elevated BNP, acute CHF exacerbation, and worsening renal function with retroperitoneal mass. PMH includes: CKD IV, CHF, DM II, hypoxic respiratory failure on 2-2.5L, HTN, PAF on Eliquis, and malnutrition.    PT Comments    Pt supine in bed on arrival.  He is eager to mobilize.  Limited distance this session.  SPO2 84%-95% on 3L [place].     Recommendations for follow up therapy are one component of a multi-disciplinary discharge planning process, led by the attending physician.  Recommendations may be updated based on patient status, additional functional criteria and insurance authorization.  Follow Up Recommendations  No PT follow up     Assistance Recommended at Discharge    Patient can return home with the following     Equipment Recommendations  None recommended by PT    Recommendations for Other Services       Precautions / Restrictions Precautions Precautions: Fall Precaution Comments: watch O2, on 3L this session Restrictions Weight Bearing Restrictions: No     Mobility  Bed Mobility Overal bed mobility: Independent             General bed mobility comments: No use of the rails, mildly increased time    Transfers Overall transfer level: Needs assistance Equipment used: Rolling walker (2 wheels) Transfers: Sit to/from Stand Sit to Stand: Supervision           General transfer comment: Cues for safety and line/lead management.    Ambulation/Gait Ambulation/Gait assistance: Min guard Gait Distance (Feet): 150 Feet Assistive device: Rolling walker (2 wheels) Gait Pattern/deviations: Step-through pattern, Decreased stride length Gait velocity: decreased     General Gait Details: very slowed with increased stability and  distance with RW, no LOB and no staggering steps   Stairs             Wheelchair Mobility    Modified Rankin (Stroke Patients Only)       Balance Overall balance assessment: Needs assistance Sitting-balance support: Feet supported, No upper extremity supported Sitting balance-Leahy Scale: Normal     Standing balance support: No upper extremity supported, During functional activity Standing balance-Leahy Scale: Fair Standing balance comment: mild instability, no UE support               High Level Balance Comments: Able to reach down and pick up objects from floor without difficulty or LOB.            Cognition Arousal/Alertness: Awake/alert Behavior During Therapy: WFL for tasks assessed/performed Overall Cognitive Status: Within Functional Limits for tasks assessed                                          Exercises      General Comments        Pertinent Vitals/Pain Pain Assessment Pain Assessment: No/denies pain    Home Living                          Prior Function            PT Goals (current goals can now be found in the care plan section) Acute Rehab PT Goals Patient Stated Goal:  to go home again soon Potential to Achieve Goals: Good Progress towards PT goals: Progressing toward goals    Frequency    Min 3X/week      PT Plan Current plan remains appropriate    Co-evaluation              AM-PAC PT "6 Clicks" Mobility   Outcome Measure  Help needed turning from your back to your side while in a flat bed without using bedrails?: None Help needed moving from lying on your back to sitting on the side of a flat bed without using bedrails?: None Help needed moving to and from a bed to a chair (including a wheelchair)?: A Little Help needed standing up from a chair using your arms (e.g., wheelchair or bedside chair)?: A Little Help needed to walk in hospital room?: A Little Help needed climbing  3-5 steps with a railing? : A Little 6 Click Score: 20    End of Session Equipment Utilized During Treatment: Gait belt Activity Tolerance: Patient tolerated treatment well;Patient limited by fatigue Patient left: in bed;with call bell/phone within reach;with nursing/sitter in room Nurse Communication: Mobility status PT Visit Diagnosis: Muscle weakness (generalized) (M62.81)     Time: 6160-7371 PT Time Calculation (min) (ACUTE ONLY): 17 min  Charges:  $Gait Training: 8-22 mins                     Erasmo Leventhal , PTA Acute Rehabilitation Services Pager [phone] Office [phone]    [employer] Eli Hose 12/25/2021, 1:29 PM

## 2021-12-26 DIAGNOSIS — N179 Acute kidney failure, unspecified: Secondary | ICD-10-CM | POA: Diagnosis not present

## 2021-12-26 DIAGNOSIS — D447 Neoplasm of uncertain behavior of aortic body and other paraganglia: Secondary | ICD-10-CM | POA: Diagnosis not present

## 2021-12-26 DIAGNOSIS — E440 Moderate protein-calorie malnutrition: Secondary | ICD-10-CM

## 2021-12-26 DIAGNOSIS — I4892 Unspecified atrial flutter: Secondary | ICD-10-CM | POA: Diagnosis not present

## 2021-12-26 DIAGNOSIS — I5031 Acute diastolic (congestive) heart failure: Secondary | ICD-10-CM | POA: Diagnosis not present

## 2021-12-26 DIAGNOSIS — E1122 Type 2 diabetes mellitus with diabetic chronic kidney disease: Secondary | ICD-10-CM

## 2021-12-26 DIAGNOSIS — E119 Type 2 diabetes mellitus without complications: Secondary | ICD-10-CM

## 2021-12-26 DIAGNOSIS — D649 Anemia, unspecified: Secondary | ICD-10-CM | POA: Diagnosis present

## 2021-12-26 LAB — RENAL FUNCTION PANEL
Albumin: 3.1 g/dL — ABNORMAL LOW (ref 3.5–5.0)
Anion gap: 16 — ABNORMAL HIGH (ref 5–15)
BUN: 108 mg/dL — ABNORMAL HIGH (ref 8–23)
CO2: 29 mmol/L (ref 22–32)
Calcium: 8.4 mg/dL — ABNORMAL LOW (ref 8.9–10.3)
Chloride: 84 mmol/L — ABNORMAL LOW (ref 98–111)
Creatinine, Ser: 5.19 mg/dL — ABNORMAL HIGH (ref 0.61–1.24)
GFR, Estimated: 11 mL/min — ABNORMAL LOW (ref >60)
Glucose, Bld: 152 mg/dL — ABNORMAL HIGH (ref 70–99)
Phosphorus: 6.3 mg/dL — ABNORMAL HIGH (ref 2.5–4.6)
Potassium: 3.5 mmol/L (ref 3.5–5.1)
Sodium: 129 mmol/L — ABNORMAL LOW (ref 135–145)

## 2021-12-26 LAB — BASIC METABOLIC PANEL
Anion gap: 16 — ABNORMAL HIGH (ref 5–15)
BUN: 106 mg/dL — ABNORMAL HIGH (ref 8–23)
CO2: 28 mmol/L (ref 22–32)
Calcium: 8.3 mg/dL — ABNORMAL LOW (ref 8.9–10.3)
Chloride: 85 mmol/L — ABNORMAL LOW (ref 98–111)
Creatinine, Ser: 5.18 mg/dL — ABNORMAL HIGH (ref 0.61–1.24)
GFR, Estimated: 11 mL/min — ABNORMAL LOW (ref >60)
Glucose, Bld: 154 mg/dL — ABNORMAL HIGH (ref 70–99)
Potassium: 3.5 mmol/L (ref 3.5–5.1)
Sodium: 129 mmol/L — ABNORMAL LOW (ref 135–145)

## 2021-12-26 LAB — CBC
HCT: 22.9 % — ABNORMAL LOW (ref 39.0–52.0)
Hemoglobin: 7.6 g/dL — ABNORMAL LOW (ref 13.0–17.0)
MCH: 28.7 pg (ref 26.0–34.0)
MCHC: 33.2 g/dL (ref 30.0–36.0)
MCV: 86.4 fL (ref 80.0–100.0)
Platelets: 257 10*3/uL (ref 150–400)
RBC: 2.65 MIL/uL — ABNORMAL LOW (ref 4.22–5.81)
RDW: 17.6 % — ABNORMAL HIGH (ref 11.5–15.5)
WBC: 9.3 10*3/uL (ref 4.0–10.5)
nRBC: 0 % (ref 0.0–0.2)

## 2021-12-26 MED ORDER — HYDROMORPHONE HCL 1 MG/ML IJ SOLN
0.5000 mg | Freq: Three times a day (TID) | INTRAMUSCULAR | Status: DC | PRN
  Administered 2021-12-27: 0.5 mg via INTRAVENOUS
  Filled 2021-12-26 (×2): qty 0.5

## 2021-12-26 MED ORDER — HYDROMORPHONE HCL 2 MG PO TABS
1.0000 mg | ORAL_TABLET | Freq: Two times a day (BID) | ORAL | Status: DC
  Administered 2021-12-26: 1 mg via ORAL
  Filled 2021-12-26: qty 1

## 2021-12-26 NOTE — Assessment & Plan Note (Signed)
Dyslipidemia.  His glucose has been stable. Continue glucose monitoring.  Patient with very poor oral intake.  Continue with statin therapy.

## 2021-12-26 NOTE — Progress Notes (Signed)
Progress Note   Patient: Adam Reid NUU:[id number] DOB: [birth year] DOA: 12/13/2021     13 DOS: the patient was seen and examined on 12/26/2021   Brief hospital course: 76 y.o. 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 On his initial physical examination his blood pressure was 119/66, HR 61, RR 24 and 02 saturation 95%, lungs with no wheezing or rales, heart with S1 and S2 present and rhythmic, positive systolic murmur at the apex, no abdominal distention, positive lower extremity edema.   Na 137, K 3,7, Cl 100, bicarbonate 21, glucose 290, bun 106, cr 4,75 BNP 2,005 Wbc 12,4 hgb 9,6 plt 386  UA SG 1,010, 100 protein, 6-10 wbc.   Chest radiograph with bilateral pleural effusions, loculated on the right side, bilateral pulmonary nodules.   CT chest and abdomen. Interval enlargement of an aggressive appearing malignant lesion in the left retroperitoneum. 6.9 x 6.1x 7.9 cm. Bilateral pleural effusions, with right lower lobe atelectasis. Bilateral ground glass opacities.   EKG 88 bpm, normal axis, qtc 516, sinus rhythm with poor R wave progression, positive LVH, no significant ST segment or T wave changes.   The patient was found to be retaining urine, so foley catheter was placed and he was admitted for AKI on stage IV CKD with nephrology, cardiology, and palliative care consulted.   Patient requested discontinuation of foley but had recurrent retention, so coude catheter was placed 6/6.  -Seen by oncology, recommended evaluation by surgical oncology -Overall prognosis felt to be poor, s/p Palliative care meeting with patient and family 6/6 with determination that the patient would like no restrictions to medical care,  hope to pursue measures with curative intent.  -Dr.Grunz discussed in detail with  endocrinology consultant and surgical oncology at [facility] on 6/6 who recommended the work up as ordered and follow up after discharge. Did not recommend inpatient transfer for management of paraganglioma.  -6/8: -> DNR  Free metanephrines significantly elevated at 9661, continue alpha-blocker, endocrinology recommended follow-up after discharge -[facility] decline offering any service. -[facility] Dr. Scarlette Slice was looking to see patient in the outpatient setting. Patient is very weak and declining and family will like their honest opinion regarding feasible chemo intervention. Otherwise they will like to go home with hospice and focus on comfort care.  12/26/21 plan to transition to oral analgesics. Continue close blood pressure control with alpha blockade. Will contact [facility] Dr. Scarlette Slice before patient discharge.  Holding on renal replacement therapy for now.       Assessment and Plan: Acute CHF (congestive heart failure) (HCC) Echocardiogram with preserved LV systolic function, EF 55 to 60%. Mild LVH. Preserved RV systolic function. Moderate mitral valve regurgitation.   Urine output over last 24 hrs is 2,400 cc Since admission 9,781 ml negative  Blood pressure 117 to 134 mmHg. HR 161 to 70 bpm.  Plan to continue doxazoin for alpha blocker.  On diltiazem for calcium channel blockade.   Acute renal failure superimposed on chronic kidney disease (HCC) CKD stage IV, Hyponatremia, urinary retention.  Cardiorenal syndrome.  Current fistula is not usable.   Renal function with serum cr at 5,18 K is 3,5 and serum bicarbonate at 28, NA 129 and anion gap 16. BUN 108.   Patient will like to delay renal replacement therapy as long as possible.  Cr looks  plateau at 5,1 to 5,2.   Plan to continue diuretic therapy with torsemide 40 mg po bid and follow up on renal function and electrolytes.   Caude catheter placed 06/06, after recurrent urinary retention post discontinuation of foley catheter.  Exchanged caude  cathter on 06/10 due to dysuria.   Continue with Phoslo and oral sodium bicarbonate.      Paraganglioma ([place]) Hypertension.  Sympathetic paraganglioma, catecolamine secreting tumor.  Plan to continue with alpha blockade.  Will follow up with Dr. Scarlette Slice final recommendations before patient can be discharged from the hospital.   Anemia Iron deficiency anemia.  Patient has received multiple doses of IV iron in June 6, 7, 11, and 12.   Type 2 diabetes mellitus (HCC) Dyslipidemia.  His glucose has been stable. Continue glucose monitoring.  Patient with very poor oral intake.  Continue with statin therapy.   Atrial flutter (HCC) Paroxysmal atrial flutter. Plan to continue rate control with amiodarone and diltiazem. Continue anticoagulation with apixaban.   Protein-calorie malnutrition, moderate ([place]) Continue with nutritional supplements.         Subjective: Patient continue to have pain at the site of the urinary cathter, improved with analgesics, no dyspnea or chest pain.   Physical Exam: Vitals:   12/25/21 1600 12/25/21 1900 12/26/21 0630 12/26/21 0845  BP: 117/62 130/66 133/70 (!) 143/74  Pulse: 66 70 70 75  Resp: '15 15 15 13  '$ Temp: 97.8 F (36.6 C) 98 F (36.7 C) 98 F (36.7 C)   TempSrc: Oral Oral Oral   SpO2: 94% 96% 100% 95%  Weight:   53.2 kg   Height:       Neurology awake and alert ENT with mild pallor Cardiovascular with S1 and S2 present and rhythmic Respiratory with no wheezing or rales Abdomen soft No lower extremity edema  Data Reviewed:    Family Communication: I spoke with patient's son at the bedside, we talked in detail about patient's condition, plan of care and prognosis and all questions were addressed.    Disposition: Status is: Inpatient Remains inpatient appropriate because: pending final plan for paraganglioma and pain control. Renal function to be stable.   Planned Discharge Destination: Home      Author: Tawni Millers, MD 12/26/2021 11:56 AM  For on call review [email or web address].

## 2021-12-26 NOTE — Progress Notes (Signed)
Patient ID: Adam Reid, male   DOB: 1946/03/05, 76 y.o.   MRN: [id number]  Assessment/Plan:  AKI/CKD stage IV - presumably combination of urinary retention and cardiorenal syndrome in the setting of acute on chronic CHF.  BUN/Cr have reached a plateau.  Good UOP after foley catheter and stopping IV lasix.  No indication for hemodialysis, however we may need to discuss the possible need for HD if his renal function continues to worsen.  He is a marginal candidate for long-term dialysis given all of his co-morbidities.  He normally follows with Dr. [name] at [facility] and will need hospital follow up after discharge.  Avoid nephrotoxic agents such as IV contrast, NSAIDs, phosphorous containing bowel preps.  Renal dose meds for GFR <15. Stopped lasix 80 mg IV daily due to rise in Scr on 12/16/21 but he remains negative with I&O's. At home he is on Torsemide '20mg'$  2 tabs BID. Lasix IV '80mg'$  x1 on 6/14 -> 925 ml UOP /24hrs; restarted the Torsemide '40mg'$ /'40mg'$  on 6/15 as he will need to be on that at d/c.  He still remains 7.5  liters negative since admission. Son back (optometrist in [place]); pt deferring his care to his son and wife. He still prefers not to do dialysis unless necessary; if needed he will need a TC if needed for HD. With the very guarded prognosis with the paraganglioma I would not advise another access surgery; current fistula is not usable.  Again still no absolute indications for RRT and high likelihood that if he's started on dialysis that it would be permanent. We've spoken at length multiple times and the patient does not want to start dialysis at this time. He's very deconditioned and unfortunately I'm not sure how the family will take care of him in this state.  Will need f/u w/ Dr. [name] ([facility]) in 2 weeks as well upon dc; he already has an appt set up. Will sign off at this time; please reconsult as needed.      Acute on chronic diastolic CHF - improved.  Palliative care following for  worsening cardiac function and FTT.  He is now DNR P. 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  Surgery recommends transfer to tertiary care center given high risk.  Declined for transfer but recommended to start alpha blocking agent.  Will need outpatient follow up. Urinary retention - foley replaced.  Predated hospitalization.  DM type 2 - per primary HTN - stable Anemia of CKD stage IV - follow H/H.  Hold off on ESA given recent diagnosis of paraganglioma.  Will defer management to Oncology.  Given IV iron.  Transfuse for Hgb <7.    S: Dysuria continues but otherwise no new complaints. Son bedside and I answered all his questions. Pain/ burning from foley worse w/ vicodin vs Dilaudid PO. A little better appetite overnight and breathing improved as well; son believes his father is somewhat better after Torsemide restarted.  O:BP (!) 143/74   Pulse 75   Temp 98 F (36.7 C) (Oral)   Resp 13   Ht '5\' 6"'$  (1.676 m)   Wt 53.2 kg   SpO2 95%   BMI 18.93 kg/m   Intake/Output Summary (Last 24 hours) at 12/26/2021 1022 Last data filed at 12/26/2021 0900 Gross per 24 hour  Intake 570 ml  Output 2400 ml  Net -1830 ml   Intake/Output: I/O last 3 completed shifts: In: 760 [P.O.:760] Out: 2550 [  Urine:2550]  Intake/Output this shift:  Total I/O In: 250 [P.O.:250] Out: -  Weight change: -0.8 kg Gen:NAD CVS: RRR Resp: rales b/l Abd: +BS, soft, NT/ND Ext: no edema GU: foley to gravity  Recent Labs  Lab 12/20/21 0350 12/21/21 0325 12/22/21 0306 12/23/21 0332 12/24/21 0431 12/25/21 0335 12/26/21 0206  NA 134* 133* 134* 133* 130* 132* 129*  129*  K 3.7 4.8 4.3 4.7 4.0 4.6 3.5  3.5  CL 95* 94* 94* 91* 90* 89* 85*  84*  CO2 '26 25 27 27 24 27 28  29  '$ GLUCOSE 148* 138* 144* 177* 127* 199* 154*  152*  BUN 97* 99* 92* 101* 104* 108* 106*  108*  CREATININE 4.72* 4.92* 4.71* 5.20* 5.11* 5.19* 5.18*   5.19*  ALBUMIN 2.8* 2.9* 2.9* 3.2* 3.2* 3.2* 3.1*  CALCIUM 8.6* 8.4* 8.4* 8.8* 8.4* 8.6* 8.3*  8.4*  PHOS 6.5* 7.6* 6.6* 7.3* 7.0* 6.9* 6.3*   Liver Function Tests: Recent Labs  Lab 12/24/21 0431 12/25/21 0335 12/26/21 0206  ALBUMIN 3.2* 3.2* 3.1*   No results for input(s): "LIPASE", "AMYLASE" in the last 168 hours. No results for input(s): "AMMONIA" in the last 168 hours. CBC: Recent Labs  Lab 12/22/21 0306 12/23/21 0332 12/24/21 0431 12/25/21 0335 12/26/21 0206  WBC 12.3* 11.9* 10.7* 9.5 9.3  HGB 7.4* 7.8* 7.6* 7.7* 7.6*  HCT 23.4* 24.7* 24.8* 23.9* 22.9*  MCV 88.3 90.5 89.5 87.2 86.4  PLT 254 279 267 261 257   Cardiac Enzymes: No results for input(s): "CKTOTAL", "CKMB", "CKMBINDEX", "TROPONINI" in the last 168 hours.  CBG: Recent Labs  Lab 12/24/21 1630 12/24/21 2027 12/24/21 2132 12/25/21 0625 12/25/21 1132  GLUCAP 70 210* 210* 149* 235*    Iron Studies: No results for input(s): "IRON", "TIBC", "TRANSFERRIN", "FERRITIN" in the last 72 hours. Studies/Results: No results found.  amiodarone  200 mg Oral Daily   apixaban  2.5 mg Oral BID   atorvastatin  80 mg Oral Daily   calcium acetate  667 mg Oral TID with meals   Chlorhexidine Gluconate Cloth  6 each Topical Daily   citalopram  20 mg Oral Daily   diltiazem  180 mg Oral Daily   doxazosin  4 mg Oral Daily   finasteride  5 mg Oral Daily    HYDROmorphone (DILAUDID) injection  0.5 mg Intravenous Once    HYDROmorphone (DILAUDID) injection  0.5 mg Intravenous Q8H   insulin aspart  0-6 Units Subcutaneous TID WC   lactulose  10 g Oral BID   linagliptin  5 mg Oral Daily   polyethylene glycol  17 g Oral Daily   senna-docusate  1 tablet Oral BID   sodium bicarbonate  650 mg Oral BID   sodium chloride flush  3 mL Intravenous Q12H   torsemide  40 mg Oral BID    BMET    Component Value Date/Time   NA 129 (L) 12/26/2021 0206   NA 129 (L) 12/26/2021 0206   NA 137 06/19/2020 0936   K 3.5 12/26/2021 0206    K 3.5 12/26/2021 0206   CL 84 (L) 12/26/2021 0206   CL 85 (L) 12/26/2021 0206   CO2 29 12/26/2021 0206   CO2 28 12/26/2021 0206   GLUCOSE 152 (H) 12/26/2021 0206   GLUCOSE 154 (H) 12/26/2021 0206   BUN 108 (H) 12/26/2021 0206   BUN 106 (H) 12/26/2021 0206   BUN 51 (H) 06/19/2020 0936   CREATININE 5.19 (H) 12/26/2021 0206   CREATININE 5.18 (H)  12/26/2021 0206   CREATININE 2.68 (H) 03/26/2016 1839   CALCIUM 8.4 (L) 12/26/2021 0206   CALCIUM 8.3 (L) 12/26/2021 0206   GFRNONAA 11 (L) 12/26/2021 0206   GFRNONAA 11 (L) 12/26/2021 0206   GFRAA 21 (L) 06/19/2020 0936   CBC    Component Value Date/Time   WBC 9.3 12/26/2021 0206   RBC 2.65 (L) 12/26/2021 0206   HGB 7.6 (L) 12/26/2021 0206   HGB 9.1 (L) 09/14/2019 1555   HCT 22.9 (L) 12/26/2021 0206   HCT 28.4 (L) 09/14/2019 1555   PLT 257 12/26/2021 0206   PLT 464 (H) 09/14/2019 1555   MCV 86.4 12/26/2021 0206   MCV 82 09/14/2019 1555   MCH 28.7 12/26/2021 0206   MCHC 33.2 12/26/2021 0206   RDW 17.6 (H) 12/26/2021 0206   RDW 16.4 (H) 09/14/2019 1555   LYMPHSABS 0.4 (L) 12/13/2021 1011   LYMPHSABS 0.6 (L) 10/05/2017 1433   MONOABS 0.5 12/13/2021 1011   EOSABS 0.1 12/13/2021 1011   EOSABS 0.1 10/05/2017 1433   BASOSABS 0.1 12/13/2021 1011   BASOSABS 0.0 10/05/2017 1433

## 2021-12-26 NOTE — Assessment & Plan Note (Addendum)
Iron deficiency anemia.  Patient has received multiple doses of IV iron in June 6, 7, 11, and 12.   His Hgb today is 7,2, will hold on PRBC transfusion, for hgb less than 7 to prevent volume overload.

## 2021-12-26 NOTE — Assessment & Plan Note (Addendum)
Hypertension.  Sympathetic paraganglioma, catecolamine secreting tumor.  Plan to continue with alpha blockade. Blood pressure is controlled for inpatient criteria. Patient is hemodynamically stable.   Will follow up with Dr. Scarlette Slice final recommendations before patient can be discharged from the hospital.

## 2021-12-26 NOTE — Assessment & Plan Note (Addendum)
Echocardiogram with preserved LV systolic function, EF 55 to 60%. Mild LVH. Preserved RV systolic function. Moderate mitral valve regurgitation.   Urine output over last 24 hrs is 3,200 cc Blood pressure systolic 340 to 370'D mmHg.   Continue with doxazoin for alpha blocker.  Continue with diltiazem for calcium channel blockade.  Continue diuresis with torsemide 40 mg daily.   Target blood pressure inpatient less 160 mmHg.

## 2021-12-26 NOTE — Progress Notes (Signed)
During morning assessments, Adam Reid refused the use of the video interpreter. Patient and Adam signed "refusal of interpreter services" and placed in chart. Adam/Wife will be patient's interpreter going forward.

## 2021-12-26 NOTE — Assessment & Plan Note (Signed)
Paroxysmal atrial flutter. Plan to continue rate control with amiodarone and diltiazem. Continue anticoagulation with apixaban.

## 2021-12-26 NOTE — Assessment & Plan Note (Signed)
Continue with nutritional supplements.  

## 2021-12-26 NOTE — Assessment & Plan Note (Addendum)
CKD stage IV, Hyponatremia, urinary retention.  Cardiorenal syndrome.  Current fistula is not usable.   Clinically hemodynamically stable, and improved volume status. Renal function with serum cr at 5,0 with K at 3,5 and serum bicarbonate at 28, Na is 131.   Patient will like to delay renal replacement therapy as long as possible.   Plan to continue diuretic therapy with torsemide 40 mg po bid and follow up on renal function and electrolytes.   Caude catheter placed 06/06, after recurrent urinary retention post discontinuation of foley catheter.  Exchanged caude cathter on 06/10 due to dysuria.   Continue with Phoslo and oral sodium bicarbonate.   Follow up as outpatient with nephrology.

## 2021-12-27 DIAGNOSIS — N179 Acute kidney failure, unspecified: Secondary | ICD-10-CM | POA: Diagnosis not present

## 2021-12-27 DIAGNOSIS — E440 Moderate protein-calorie malnutrition: Secondary | ICD-10-CM | POA: Diagnosis not present

## 2021-12-27 DIAGNOSIS — I5031 Acute diastolic (congestive) heart failure: Secondary | ICD-10-CM | POA: Diagnosis not present

## 2021-12-27 DIAGNOSIS — I4892 Unspecified atrial flutter: Secondary | ICD-10-CM | POA: Diagnosis not present

## 2021-12-27 LAB — RENAL FUNCTION PANEL
Albumin: 3 g/dL — ABNORMAL LOW (ref 3.5–5.0)
Anion gap: 19 — ABNORMAL HIGH (ref 5–15)
BUN: 109 mg/dL — ABNORMAL HIGH (ref 8–23)
CO2: 28 mmol/L (ref 22–32)
Calcium: 8.2 mg/dL — ABNORMAL LOW (ref 8.9–10.3)
Chloride: 84 mmol/L — ABNORMAL LOW (ref 98–111)
Creatinine, Ser: 5.06 mg/dL — ABNORMAL HIGH (ref 0.61–1.24)
GFR, Estimated: 11 mL/min — ABNORMAL LOW (ref >60)
Glucose, Bld: 187 mg/dL — ABNORMAL HIGH (ref 70–99)
Phosphorus: 6.3 mg/dL — ABNORMAL HIGH (ref 2.5–4.6)
Potassium: 3.5 mmol/L (ref 3.5–5.1)
Sodium: 131 mmol/L — ABNORMAL LOW (ref 135–145)

## 2021-12-27 LAB — CBC
HCT: 22.4 % — ABNORMAL LOW (ref 39.0–52.0)
Hemoglobin: 7.2 g/dL — ABNORMAL LOW (ref 13.0–17.0)
MCH: 27.7 pg (ref 26.0–34.0)
MCHC: 32.1 g/dL (ref 30.0–36.0)
MCV: 86.2 fL (ref 80.0–100.0)
Platelets: 272 10*3/uL (ref 150–400)
RBC: 2.6 MIL/uL — ABNORMAL LOW (ref 4.22–5.81)
RDW: 17.6 % — ABNORMAL HIGH (ref 11.5–15.5)
WBC: 8.8 10*3/uL (ref 4.0–10.5)
nRBC: 0 % (ref 0.0–0.2)

## 2021-12-27 MED ORDER — HYDROMORPHONE HCL 2 MG PO TABS
1.0000 mg | ORAL_TABLET | Freq: Two times a day (BID) | ORAL | Status: DC | PRN
  Administered 2021-12-28: 1 mg via ORAL
  Filled 2021-12-27: qty 1

## 2021-12-27 NOTE — Progress Notes (Addendum)
Progress Note   Patient: Adam Reid FGH:[id number] DOB: 22-May-1946 DOA: 12/13/2021     14 DOS: the patient was seen and examined on 12/27/2021   Brief hospital course: 76 y.o. 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 On his initial physical examination his blood pressure was 119/66, HR 61, RR 24 and 02 saturation 95%, lungs with no wheezing or rales, heart with S1 and S2 present and rhythmic, positive systolic murmur at the apex, no abdominal distention, positive lower extremity edema.   Na 137, K 3,7, Cl 100, bicarbonate 21, glucose 290, bun 106, cr 4,75 BNP 2,005 Wbc 12,4 hgb 9,6 plt 386  UA SG 1,010, 100 protein, 6-10 wbc.   Chest radiograph with bilateral pleural effusions, loculated on the right side, bilateral pulmonary nodules.   CT chest and abdomen. Interval enlargement of an aggressive appearing malignant lesion in the left retroperitoneum. 6.9 x 6.1x 7.9 cm. Bilateral pleural effusions, with right lower lobe atelectasis. Bilateral ground glass opacities.   EKG 88 bpm, normal axis, qtc 516, sinus rhythm with poor R wave progression, positive LVH, no significant ST segment or T wave changes.   The patient was found to be retaining urine, so foley catheter was placed and he was admitted for AKI on stage IV CKD with nephrology, cardiology, and palliative care consulted.   Patient requested discontinuation of foley but had recurrent retention, so coude catheter was placed 6/6.  -Seen by oncology, recommended evaluation by surgical oncology -Overall prognosis felt to be poor, s/p Palliative care meeting with patient and family 6/6 with determination that the patient would like no restrictions to medical care,  hope to pursue measures with curative intent.  -Dr.Grunz discussed in detail with  endocrinology consultant and surgical oncology at [facility] on 6/6 who recommended the work up as ordered and follow up after discharge. Did not recommend inpatient transfer for management of paraganglioma.  -6/8: -> DNR  Free metanephrines significantly elevated at 9661, continue alpha-blocker, endocrinology recommended follow-up after discharge -[facility] decline offering any service. -[facility] Dr. Scarlette Slice was looking to see patient in the outpatient setting. Patient is very weak and declining and family will like their honest opinion regarding feasible chemo intervention. Otherwise they will like to go home with hospice and focus on comfort care.  12/26/21 plan to transition to oral analgesics. Continue close blood pressure control with alpha blockade. Will contact [facility] Dr. Scarlette Slice before patient discharge.  Holding on renal replacement therapy for now.       Assessment and Plan: Acute CHF (congestive heart failure) (HCC) Echocardiogram with preserved LV systolic function, EF 55 to 60%. Mild LVH. Preserved RV systolic function. Moderate mitral valve regurgitation.   Urine output over last 24 hrs is 1,100 cc Blood pressure systolic 678 to 938 mmHg.   Plan to continue doxazoin for alpha blocker.  On diltiazem for calcium channel blockade.  Continue diuresis with torsemide 40 mg daily.   Target blood pressure inpatient less 160 mmHg.    Acute renal failure superimposed on chronic kidney disease (HCC) CKD stage IV, Hyponatremia, urinary retention.  Cardiorenal syndrome.  Current fistula is not usable.   Clinically hemodynamically stable, and improved volume status. Renal function with serum cr at 5,0 with K at 3,5 and serum bicarbonate at 28, Na is 131.   Patient will like  to delay renal replacement therapy as long as possible.   Plan to continue diuretic therapy with torsemide 40 mg po bid and follow up on renal function and electrolytes.   Caude catheter placed 06/06, after recurrent urinary  retention post discontinuation of foley catheter.  Exchanged caude cathter on 06/10 due to dysuria.   Continue with Phoslo and oral sodium bicarbonate.   Follow up as outpatient with nephrology.      Paraganglioma ([place]) Hypertension.  Sympathetic paraganglioma, catecolamine secreting tumor.  Plan to continue with alpha blockade. Blood pressure is controlled for inpatient criteria. Patient is hemodynamically stable.   Will follow up with Dr. Scarlette Slice final recommendations before patient can be discharged from the hospital.   Anemia Iron deficiency anemia.  Patient has received multiple doses of IV iron in June 6, 7, 11, and 12.   His Hgb today is 7,2, will hold on PRBC transfusion, for hgb less than 7 to prevent volume overload.   Type 2 diabetes mellitus (HCC) Dyslipidemia.  His glucose has been stable. Continue glucose monitoring.  Patient with very poor oral intake.  Continue with statin therapy.   Atrial flutter (HCC) Paroxysmal atrial flutter. Plan to continue rate control with amiodarone and diltiazem. Continue anticoagulation with apixaban.   Protein-calorie malnutrition, moderate ([place]) Continue with nutritional supplements.         Subjective: Patient is feeling better, denies dyspnea or chest pain, tolerating po  Physical Exam: Vitals:   12/26/21 1800 12/26/21 2106 12/27/21 0648 12/27/21 0842  BP: 140/72 119/61 132/64 132/67  Pulse:  70 70 84  Resp: '17 17 17 15  '$ Temp:  98 F (36.7 C) 98.3 F (36.8 C) 98.2 F (36.8 C)  TempSrc:  Oral Oral Oral  SpO2: 97% 96% 94% 96%  Weight:      Height:       Neurology awake and alert ENT with no pallor Cardiovascular with S1 and S2 present and rhythmic with no gallops, rubs or murmurs Respiratory with no rales or wheezing Abdomen no distended No lower extremity edema  Data Reviewed:    Family Communication: I spoke over the phone with the patient's son about patient's  condition, plan of care, prognosis  and all questions were addressed.   Disposition: Status is: Inpatient Remains inpatient appropriate because: possible discharge in 48 hrs,  Planned Discharge Destination: Home  Author: Tawni Millers, MD 12/27/2021 2:35 PM  For on call review [email or web address].

## 2021-12-27 NOTE — Progress Notes (Signed)
Per pt's son Adam Reid request, to cancel all lunch orders (from now and future) and to keep breakfast and dinner as is.

## 2021-12-28 DIAGNOSIS — E440 Moderate protein-calorie malnutrition: Secondary | ICD-10-CM | POA: Diagnosis not present

## 2021-12-28 DIAGNOSIS — R339 Retention of urine, unspecified: Secondary | ICD-10-CM | POA: Diagnosis not present

## 2021-12-28 DIAGNOSIS — I5031 Acute diastolic (congestive) heart failure: Secondary | ICD-10-CM | POA: Diagnosis not present

## 2021-12-28 DIAGNOSIS — Z515 Encounter for palliative care: Secondary | ICD-10-CM | POA: Diagnosis not present

## 2021-12-28 DIAGNOSIS — D447 Neoplasm of uncertain behavior of aortic body and other paraganglia: Secondary | ICD-10-CM | POA: Diagnosis not present

## 2021-12-28 DIAGNOSIS — N179 Acute kidney failure, unspecified: Secondary | ICD-10-CM | POA: Diagnosis not present

## 2021-12-28 DIAGNOSIS — I4892 Unspecified atrial flutter: Secondary | ICD-10-CM | POA: Diagnosis not present

## 2021-12-28 LAB — GLUCOSE, CAPILLARY
Glucose-Capillary: 216 mg/dL — ABNORMAL HIGH (ref 70–99)
Glucose-Capillary: 244 mg/dL — ABNORMAL HIGH (ref 70–99)
Glucose-Capillary: 185 mg/dL — ABNORMAL HIGH (ref 70–99)
Glucose-Capillary: 294 mg/dL — ABNORMAL HIGH (ref 70–99)
Glucose-Capillary: 124 mg/dL — ABNORMAL HIGH (ref 70–99)
Glucose-Capillary: 211 mg/dL — ABNORMAL HIGH (ref 70–99)
Glucose-Capillary: 215 mg/dL — ABNORMAL HIGH (ref 70–99)
Glucose-Capillary: 215 mg/dL — ABNORMAL HIGH (ref 70–99)
Glucose-Capillary: 168 mg/dL — ABNORMAL HIGH (ref 70–99)
Glucose-Capillary: 204 mg/dL — ABNORMAL HIGH (ref 70–99)
Glucose-Capillary: 221 mg/dL — ABNORMAL HIGH (ref 70–99)
Glucose-Capillary: 163 mg/dL — ABNORMAL HIGH (ref 70–99)
Glucose-Capillary: 298 mg/dL — ABNORMAL HIGH (ref 70–99)

## 2021-12-28 LAB — RENAL FUNCTION PANEL
Albumin: 3.1 g/dL — ABNORMAL LOW (ref 3.5–5.0)
Anion gap: 17 — ABNORMAL HIGH (ref 5–15)
BUN: 107 mg/dL — ABNORMAL HIGH (ref 8–23)
CO2: 31 mmol/L (ref 22–32)
Calcium: 8.5 mg/dL — ABNORMAL LOW (ref 8.9–10.3)
Chloride: 82 mmol/L — ABNORMAL LOW (ref 98–111)
Creatinine, Ser: 5.11 mg/dL — ABNORMAL HIGH (ref 0.61–1.24)
GFR, Estimated: 11 mL/min — ABNORMAL LOW (ref >60)
Glucose, Bld: 143 mg/dL — ABNORMAL HIGH (ref 70–99)
Phosphorus: 7 mg/dL — ABNORMAL HIGH (ref 2.5–4.6)
Potassium: 3.4 mmol/L — ABNORMAL LOW (ref 3.5–5.1)
Sodium: 130 mmol/L — ABNORMAL LOW (ref 135–145)

## 2021-12-28 MED ORDER — BISACODYL 10 MG RE SUPP
10.0000 mg | Freq: Every day | RECTAL | Status: DC | PRN
  Administered 2021-12-28 – 2021-12-30 (×2): 10 mg via RECTAL
  Filled 2021-12-28 (×2): qty 1

## 2021-12-28 NOTE — Progress Notes (Signed)
Progress Note   Patient: Adam Reid VOZ:[id number] DOB: 05-18-1946 DOA: 12/13/2021     15 DOS: the patient was seen and examined on 12/28/2021   Brief hospital course: 76 y.o. 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 On his initial physical examination his blood pressure was 119/66, HR 61, RR 24 and 02 saturation 95%, lungs with no wheezing or rales, heart with S1 and S2 present and rhythmic, positive systolic murmur at the apex, no abdominal distention, positive lower extremity edema.   Na 137, K 3,7, Cl 100, bicarbonate 21, glucose 290, bun 106, cr 4,75 BNP 2,005 Wbc 12,4 hgb 9,6 plt 386  UA SG 1,010, 100 protein, 6-10 wbc.   Chest radiograph with bilateral pleural effusions, loculated on the right side, bilateral pulmonary nodules.   CT chest and abdomen. Interval enlargement of an aggressive appearing malignant lesion in the left retroperitoneum. 6.9 x 6.1x 7.9 cm. Bilateral pleural effusions, with right lower lobe atelectasis. Bilateral ground glass opacities.   EKG 88 bpm, normal axis, qtc 516, sinus rhythm with poor R wave progression, positive LVH, no significant ST segment or T wave changes.   The patient was found to be retaining urine, so foley catheter was placed and he was admitted for AKI on stage IV CKD with nephrology, cardiology, and palliative care consulted.   Patient requested discontinuation of foley but had recurrent retention, so coude catheter was placed 6/6.  -Seen by oncology, recommended evaluation by surgical oncology -Overall prognosis felt to be poor, s/p Palliative care meeting with patient and family 6/6 with determination that the patient would like no restrictions to medical care,  hope to pursue measures with curative intent.  -Dr.Grunz discussed in detail with  endocrinology consultant and surgical oncology at [facility] on 6/6 who recommended the work up as ordered and follow up after discharge. Did not recommend inpatient transfer for management of paraganglioma.  -6/8: -> DNR  Free metanephrines significantly elevated at 9661, continue alpha-blocker, endocrinology recommended follow-up after discharge -[facility] decline offering any service. -[facility] Dr. Scarlette Slice was looking to see patient in the outpatient setting. Patient is very weak and declining and family will like their honest opinion regarding feasible chemo intervention. Otherwise they will like to go home with hospice and focus on comfort care.  12/26/21 plan to transition to oral analgesics. Continue close blood pressure control with alpha blockade. Will contact [facility] Dr. Scarlette Slice before patient discharge.  Holding on renal replacement therapy for now.    Assessment and Plan: Acute CHF (congestive heart failure) (HCC) Echocardiogram with preserved LV systolic function, EF 55 to 60%. Mild LVH. Preserved RV systolic function. Moderate mitral valve regurgitation.   Urine output over last 24 hrs is 3,200 cc Blood pressure systolic 425 to 956'L mmHg.   Continue with doxazoin for alpha blocker.  Continue with diltiazem for calcium channel blockade.  Continue diuresis with torsemide 40 mg daily.   Target blood pressure inpatient less 160 mmHg.    Acute renal failure superimposed on chronic kidney disease (HCC) CKD stage IV, Hyponatremia, hypokalemia, urinary retention.  Cardiorenal syndrome.  Current fistula is not usable.   Clinically hemodynamically stable, and improved volume status. Renal function with plateau serum cr at 5,1 with K today at 3,4 and serum bicarbonate at 31.   Patient will like to delay renal  replacement therapy as long as possible.   Add 40 meq Kcl today.  Follow up renal function in am. Continue diuresis with torsemide.   Caude catheter placed 06/06, after recurrent urinary  retention post discontinuation of foley catheter.  Exchanged caude cathter on 06/10 due to dysuria.   Continue with Phoslo and oral sodium bicarbonate.   Follow up as outpatient with nephrology and surgery oncology as outpatient.  Patient will be discharged with urinary cathter.     Paraganglioma ([place]) Hypertension.  Sympathetic paraganglioma, catecolamine secreting tumor.  Plan to continue with alpha blockade. Blood pressure is controlled for inpatient criteria. Patient is hemodynamically stable.   I have called Dr Scarlette Slice today, pending called back. Patient's family want to know potential treatment options considering his current clinical status. Family will meet with palliative care today.   Anemia Iron deficiency anemia.  Patient has received multiple doses of IV iron in June 6, 7, 11, and 12.   Follow up H&H in am.   Type 2 diabetes mellitus ([place]) Dyslipidemia.  His glucose has been stable. Continue glucose monitoring.  Patient with very poor oral intake.  Continue with statin therapy.   Atrial flutter (HCC) Paroxysmal atrial flutter. Plan to continue rate control with amiodarone and diltiazem. Continue anticoagulation with apixaban.   Protein-calorie malnutrition, moderate ([place]) Continue with nutritional supplements.         Subjective: Patient with poor appetite, very weak and deconditioned, no dyspnea or chest pain, no abdominal pain.   Physical Exam: Vitals:   12/27/21 0842 12/27/21 2050 12/28/21 0500 12/28/21 0952  BP: 132/67 118/65 (!) 157/63 (!) 151/64  Pulse: 84 69 77 76  Resp: '15 16 17 18  '$ Temp: 98.2 F (36.8 C) 98.3 F (36.8 C) 98 F (36.7 C) 98 F (36.7 C)  TempSrc: Oral Oral Oral Oral  SpO2: 96% 96% 90% 96%  Weight:      Height:       Neurology awake and alert ENT with mild pallor Cardiovascular with systolic murmur at the apex 3/6, no gallops, or rubs Respiratory with no rales or wheezing Abdomen not distended No lower extremity  edema  Data Reviewed:   Family Communication: I spoke with patient's son at the bedside, we talked in detail about patient's condition, plan of care and prognosis and all questions were addressed.  Disposition: Status is: Inpatient Remains inpatient appropriate because: renal failure   Planned Discharge Destination: Home      Author: Tawni Millers, MD 12/28/2021 12:00 PM  For on call review [email or web address].

## 2021-12-28 NOTE — Progress Notes (Signed)
Physical Therapy Treatment Patient Details Name: Adam Reid MRN: [id number] DOB: 1946-02-24 Today's Date: 12/28/2021   History of Present Illness The pt is a 76 yo male presenting 6/4 with SOB and hypoxia. Work up revealed elevated BNP, acute CHF exacerbation, and worsening renal function with retroperitoneal mass. PMH includes: CKD IV, CHF, DM II, hypoxic respiratory failure on 2-2.5L, HTN, PAF on Eliquis, and malnutrition.    PT Comments    Pt was seen for mobility from bed to walk, and then to do simple ROM on LE's.  Pt is looking fragile, but via translation declines to say anything negative about how he is feeling.  Several short standing moments occurred during walk in which the distance from last session to today was titrated down.  Pt is going to need careful management of his energy and tolerance, and cannot necessarily be expected to set his own limits.  Follow acutely for goals of PT as were previously outlined.  Pt is expecting to go to his home after therapy and hosp is completed.  Recommendations for follow up therapy are one component of a multi-disciplinary discharge planning process, led by the attending physician.  Recommendations may be updated based on patient status, additional functional criteria and insurance authorization.  Follow Up Recommendations  No PT follow up     Assistance Recommended at Discharge Set up Supervision/Assistance  Patient can return home with the following A little help with walking and/or transfers;A little help with bathing/dressing/bathroom;Assistance with cooking/housework;Assist for transportation;Help with stairs or ramp for entrance   Equipment Recommendations  None recommended by PT    Recommendations for Other Services       Precautions / Restrictions Precautions Precautions: Fall Precaution Comments: watch O2, on 3L this session Restrictions Weight Bearing Restrictions: No     Mobility  Bed Mobility Overal bed mobility:  Modified Independent                  Transfers Overall transfer level: Needs assistance Equipment used: Rolling walker (2 wheels) Transfers: Sit to/from Stand Sit to Stand: Min guard           General transfer comment: min guard for safety and management of lines    Ambulation/Gait Ambulation/Gait assistance: Min assist Gait Distance (Feet): 150 Feet Assistive device: Rolling walker (2 wheels) (with O2, telemetry) Gait Pattern/deviations: Step-through pattern, Decreased stride length, Wide base of support Gait velocity: decreased Gait velocity interpretation: <1.31 ft/sec, indicative of household ambulator Pre-gait activities: assess standing balance General Gait Details: fairly stable on RW with PT bringing O2, telemetry, and translation tool   Stairs             Wheelchair Mobility    Modified Rankin (Stroke Patients Only)       Balance Overall balance assessment: Needs assistance Sitting-balance support: Feet supported Sitting balance-Leahy Scale: Good Sitting balance - Comments: mildly lethargic   Standing balance support: Bilateral upper extremity supported, During functional activity Standing balance-Leahy Scale: Fair Standing balance comment: less than fair dynamically                            Cognition Arousal/Alertness: Awake/alert Behavior During Therapy: WFL for tasks assessed/performed Overall Cognitive Status: Within Functional Limits for tasks assessed                                 General Comments: responds appropriately to questions  via translation        Exercises General Exercises - Lower Extremity Ankle Circles/Pumps: AAROM, 5 reps Hip ABduction/ADduction: AAROM, 10 reps Straight Leg Raises: AAROM, 10 reps    General Comments General comments (skin integrity, edema, etc.): Pt is demonstrating mild instability walking with no outright LOB      Pertinent Vitals/Pain Pain Assessment Pain  Assessment: No/denies pain Breathing: normal    Home Living                          Prior Function            PT Goals (current goals can now be found in the care plan section) Acute Rehab PT Goals Patient Stated Goal: home Progress towards PT goals: Progressing toward goals    Frequency    Min 3X/week      PT Plan Current plan remains appropriate    Co-evaluation              AM-PAC PT "6 Clicks" Mobility   Outcome Measure  Help needed turning from your back to your side while in a flat bed without using bedrails?: None Help needed moving from lying on your back to sitting on the side of a flat bed without using bedrails?: A Little Help needed moving to and from a bed to a chair (including a wheelchair)?: A Little Help needed standing up from a chair using your arms (e.g., wheelchair or bedside chair)?: A Little Help needed to walk in hospital room?: A Little Help needed climbing 3-5 steps with a railing? : A Little 6 Click Score: 19    End of Session Equipment Utilized During Treatment: Gait belt;Oxygen Activity Tolerance: Patient tolerated treatment well;Patient limited by fatigue Patient left: in bed;with call bell/phone within reach;with nursing/sitter in room Nurse Communication: Mobility status PT Visit Diagnosis: Muscle weakness (generalized) (M62.81)     Time: 5027-7412 PT Time Calculation (min) (ACUTE ONLY): 31 min  Charges:  $Gait Training: 8-22 mins $Therapeutic Exercise: 8-22 mins      Ramond Dial 12/28/2021, 4:09 PM  Mee Hives, PT PhD Acute Rehab Dept. Number: [place] [facility] [place]

## 2021-12-28 NOTE — Progress Notes (Signed)
Pt and family report no BM for 5-6 days despite Senokot and Lactulose. They request a suppository tonight. MD paged.

## 2021-12-28 NOTE — Progress Notes (Signed)
Patient ID: Ziaire Hagos, male   DOB: Nov 22, 1945, 76 y.o.   MRN: [id number]    Progress Note from the Palliative Medicine Team at [facility]   Patient Name: Adam Reid        Date: 12/28/2021 DOB: March 18, 1946  Age: 76 y.o. MRN#: [id number] Attending Physician: Tawni Millers Primary Care Physician: [name], MD Admit Date: 12/13/2021   Medical records reviewed,   discussed case with attending   Per intake H&P - 76 year old male with a past medical history of chronic diastolic CHF, BPH, anemia chronic disease, stage IV CKD, type II DM, depression, hypertension, history of GI bleed, paroxysmal atrial fibrillation, secondary hyper parathyroidism, thyroid disease, history of cigarette smoking who presented to the emergency department with urinary burning, dyspnea and progressively worse edema.  Work-up shows BNP of over 2000 pg/mL, worsening renal function and retroperitoneal mass.    Palliative care asked to get involved in the setting of worsening kidney function and large left retroperitoneal paraglioma.   This NP visited patient at the bedside as a follow up to for palliative medicine needs and emotional support, and for continued conversation regarding current medical situation; treatment option decisions, and anticipatory care needs.  I have met with patient's wife and son on multiple occasions helping them navigate this hospital stay.  Today is day 15 of this hospital stay.  Today I spoke to patient's wife and she refers me to speak to her son.  I met today with Adam Reid and we talked about current situation.  Adam Reid the seriousness of his father's medical situation and the associated poor prognosis however at this time he wishes to honor his father's personhood and his hope for improvement.    Although it does not seem likely that patient will improve,  family will secure another appointment with Dr. Loreta Ave  and follow-up with  nephrology/Dr. [name] as an outpatient, if patient is able to make these appointments in the near future.  Decisions will be made on patient outcomes over the next days and weeks.  Education offered on hospice benefit; philosophy and eligibility.    I shared with family that patient is eligible at this time for hospice benefit with a prognosis of less than 6 months.  Family declined hospice services at this time, they are open to outpatient community-based palliative services in the home once he is discharged.  Plan of care: -DNR/DNI -Continue current medical interventions, treat the treatable and hope for improvement, hopes to avoid dialysis but would consider "if life or death' -Discharge home, son plans to secure in-home nursing care to assist his mother (family is inquiring about PCS/CAPS services through Medicaid) - OP community-based palliative services, family interested in [place]   Again today education offered to both wife and son,  regarding my concern for patient's overall continued failure to thrive and high risk for decompensation.  Family encouraged to continue exploring with patient his personal goals of care and desire to seek aggressive life-prolonging measures.  Questions and concerns addressed   Discussed with Dr Cathlean Sauer and [facility] team    PMT will continue to support holistically   Wadie Lessen NP  Palliative Medicine Team Team Phone # [phone] Pager [phone]

## 2021-12-28 NOTE — Progress Notes (Addendum)
I spoke with Dr Scarlette Slice from [place], updated about patient's condition. Recommendation to follow up in the outpatient for further recommendations.   I spoke with Mr. Adam Reid son about my conversation with Dr Scarlette Slice.   Personally I consider that patient is very deconditioned, and has very poor functional physical status. Very poor prognosis. He mentions that his father will like to avoid hemodialysis.  Plan for family meeting today with palliative care team.  Plan for possible discharge home in am with close follow up as outpatient.  All questions were addressed.

## 2021-12-29 ENCOUNTER — Telehealth: Payer: Self-pay | Admitting: Family Medicine

## 2021-12-29 DIAGNOSIS — N179 Acute kidney failure, unspecified: Secondary | ICD-10-CM | POA: Diagnosis not present

## 2021-12-29 DIAGNOSIS — D447 Neoplasm of uncertain behavior of aortic body and other paraganglia: Secondary | ICD-10-CM | POA: Diagnosis not present

## 2021-12-29 DIAGNOSIS — I5031 Acute diastolic (congestive) heart failure: Secondary | ICD-10-CM | POA: Diagnosis not present

## 2021-12-29 DIAGNOSIS — E1122 Type 2 diabetes mellitus with diabetic chronic kidney disease: Secondary | ICD-10-CM | POA: Diagnosis not present

## 2021-12-29 LAB — GLUCOSE, CAPILLARY
Glucose-Capillary: 163 mg/dL — ABNORMAL HIGH (ref 70–99)
Glucose-Capillary: 257 mg/dL — ABNORMAL HIGH (ref 70–99)
Glucose-Capillary: 152 mg/dL — ABNORMAL HIGH (ref 70–99)
Glucose-Capillary: 311 mg/dL — ABNORMAL HIGH (ref 70–99)

## 2021-12-29 LAB — RENAL FUNCTION PANEL
Albumin: 3.2 g/dL — ABNORMAL LOW (ref 3.5–5.0)
Anion gap: 19 — ABNORMAL HIGH (ref 5–15)
BUN: 109 mg/dL — ABNORMAL HIGH (ref 8–23)
CO2: 34 mmol/L — ABNORMAL HIGH (ref 22–32)
Calcium: 8.9 mg/dL (ref 8.9–10.3)
Chloride: 79 mmol/L — ABNORMAL LOW (ref 98–111)
Creatinine, Ser: 5.15 mg/dL — ABNORMAL HIGH (ref 0.61–1.24)
GFR, Estimated: 11 mL/min — ABNORMAL LOW (ref >60)
Glucose, Bld: 147 mg/dL — ABNORMAL HIGH (ref 70–99)
Phosphorus: 6.5 mg/dL — ABNORMAL HIGH (ref 2.5–4.6)
Potassium: 3.1 mmol/L — ABNORMAL LOW (ref 3.5–5.1)
Sodium: 132 mmol/L — ABNORMAL LOW (ref 135–145)

## 2021-12-29 LAB — HEMOGLOBIN AND HEMATOCRIT, BLOOD
HCT: 24.1 % — ABNORMAL LOW (ref 39.0–52.0)
Hemoglobin: 8 g/dL — ABNORMAL LOW (ref 13.0–17.0)

## 2021-12-29 MED ORDER — POTASSIUM CHLORIDE CRYS ER 20 MEQ PO TBCR
40.0000 meq | EXTENDED_RELEASE_TABLET | Freq: Once | ORAL | Status: AC
  Administered 2021-12-29: 40 meq via ORAL
  Filled 2021-12-29: qty 2

## 2021-12-29 NOTE — TOC Progression Note (Signed)
Transition of Care [facility]) - Progression Note    Patient Details  Name: Adam Reid MRN: [id number] Date of Birth: 11/18/1945  Transition of Care [facility]) CM/SW Contact  Zenon Mayo, RN Phone Number: 12/29/2021, 12:49 PM  Clinical Narrative:    NCM spoke with son, Mike Craze, offered choice for [facility] services, he states he is ok with Alvis Lemmings or Latricia Heft,  NCM made referral to [facility] with [facility] for [facility] for disease management and medication management, HHPT, HHAIDE.  He is able to take referral.  Soc will begin 24 to 48 hrs post dc. Son also stated they would like outpatient palliative services with [facility].  NCM made referral to [facility].  Patient will be transported home by car at dc on Thursday per son.  [place] agency is to call wife , EX[employer] [phone] to set up apt times.  Patient has home oxygen with [employer] 2.5 top 3 liters  already and son will bring oxygen tank to transport him home. TOC will continue to follow for dc needs.   Expected Discharge Plan: [place] Barriers to Discharge: No Barriers Identified  Expected Discharge Plan and Services Expected Discharge Plan: [place]   Discharge Planning Services: CM Consult Post Acute Care Choice: [place] arrangements for the past 2 months: Single Family Home Expected Discharge Date: 12/24/21                 DME Agency: NA       HH Arranged: RN, Disease Management, PT, Nurse's Aide [place] Agency: [place] Date [facility] Agency Contacted: 12/29/21 Time [place]: [id number] Representative spoke with at [place]: [place] Interventions    Readmission Risk Interventions    12/15/2021    9:03 AM 11/16/2021    3:15 PM  Readmission Risk Prevention Plan  Transportation Screening Complete Complete  PCP or Specialist Appt within 3-5 Days Complete Complete  HRI or Home Care Consult Complete Complete  Social Work Consult for  [place] Planning/Counseling Complete Complete  Palliative Care Screening Not Applicable Not Applicable  Medication Review Press photographer) Complete Complete

## 2021-12-29 NOTE — Care Management Important Message (Signed)
Important Message  Patient Details  Name: Kyzen Horn MRN: [id number] Date of Birth: 12-09-1945   Medicare Important Message Given:  Yes     Shelda Altes 12/29/2021, 9:02 AM

## 2021-12-29 NOTE — Telephone Encounter (Signed)
Call placed to hospice. Verbal order given for enrollment as long as ok with patient and family.

## 2021-12-29 NOTE — Telephone Encounter (Signed)
Hospice called asking for verbal order for this pt to be enrolled into their program.Telephone number is [phone]

## 2021-12-29 NOTE — Progress Notes (Signed)
Progress Note   Patient: Adam Reid WSF:[id number] DOB: 10-24-1945 DOA: 12/13/2021     16 DOS: the patient was seen and examined on 12/29/2021   Brief hospital course: 76 y.o. 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 On his initial physical examination his blood pressure was 119/66, HR 61, RR 24 and 02 saturation 95%, lungs with no wheezing or rales, heart with S1 and S2 present and rhythmic, positive systolic murmur at the apex, no abdominal distention, positive lower extremity edema.   Na 137, K 3,7, Cl 100, bicarbonate 21, glucose 290, bun 106, cr 4,75 BNP 2,005 Wbc 12,4 hgb 9,6 plt 386  UA SG 1,010, 100 protein, 6-10 wbc.   Chest radiograph with bilateral pleural effusions, loculated on the right side, bilateral pulmonary nodules.   CT chest and abdomen. Interval enlargement of an aggressive appearing malignant lesion in the left retroperitoneum. 6.9 x 6.1x 7.9 cm. Bilateral pleural effusions, with right lower lobe atelectasis. Bilateral ground glass opacities.   EKG 88 bpm, normal axis, qtc 516, sinus rhythm with poor R wave progression, positive LVH, no significant ST segment or T wave changes.   The patient was found to be retaining urine, so foley catheter was placed and he was admitted for AKI on stage IV CKD with nephrology, cardiology, and palliative care consulted.   Patient requested discontinuation of foley but had recurrent retention, so coude catheter was placed 6/6.  -Seen by oncology, recommended evaluation by surgical oncology -Overall prognosis felt to be poor, s/p Palliative care meeting with patient and family 6/6 with determination that the patient would like no restrictions to medical care,  hope to pursue measures with curative intent.  -Dr.Grunz discussed in detail with  endocrinology consultant and surgical oncology at [facility] on 6/6 who recommended the work up as ordered and follow up after discharge. Did not recommend inpatient transfer for management of paraganglioma.  -6/8: -> DNR  Free metanephrines significantly elevated at 9661, continue alpha-blocker, endocrinology recommended follow-up after discharge -[facility] decline offering any service. -[facility] Dr. Scarlette Slice was looking to see patient in the outpatient setting. Patient is very weak and declining and family will like their honest opinion regarding feasible chemo intervention. Otherwise they will like to go home with hospice and focus on comfort care.  12/26/21 plan to transition to oral analgesics. Continue close blood pressure control with alpha blockade. Will contact [facility] Dr. Scarlette Slice before patient discharge.  Holding on renal replacement therapy for now.   12/28/21 family meeting with palliative care. Plan to continue with palliative care as outpatient.  12/29/21 patient has been stable, tolerating well diuresis with torsemide. I spoke with her son at the bedside, currently coordinating with social work home services. He will follow up with Dr Scarlette Slice as outpatient.   Plan discharge date is 12/31/21.   Assessment and Plan: Acute CHF (congestive heart failure) (HCC) Echocardiogram with preserved LV systolic function, EF 55 to 60%. Mild LVH. Preserved RV systolic function. Moderate mitral valve regurgitation.   Urine output over last 24 hrs is 1,400 cc Blood pressure systolic 017 to 494 mmHg.   On doxazoin for alpha blocker.  Tolerating well diltiazem for calcium channel blockade.  Continue diuresis with torsemide 40 mg daily.   Target blood pressure inpatient is 160 mmHg or less.    Acute renal  failure superimposed on chronic kidney disease (HCC) CKD stage IV, Hyponatremia, hypokalemia, urinary retention.  Cardiorenal syndrome.  Current fistula is not usable.   Clinically hemodynamically stable,  and improved volume status. Renal function with plateau serum cr at 5,1. Today K is 3,1 with serum bicarbonate at 34.   Continue K correction with 40 meq Kcl today.  Follow up renal function in am. Continue diuresis with torsemide.   Caude catheter placed 06/06, after recurrent urinary retention post discontinuation of foley catheter.  Exchanged caude cathter on 06/10 due to dysuria.   Continue with Phoslo and oral sodium bicarbonate.   Follow up as outpatient with nephrology and surgery oncology as outpatient.  Patient will be discharged with urinary cathter. Patient does not want hemodialysis.      Paraganglioma ([place]) Hypertension.  Sympathetic paraganglioma, catecolamine secreting tumor.  Plan to continue with alpha blockade. Blood pressure is controlled for inpatient criteria. Patient is hemodynamically stable.   I have called Dr Scarlette Slice (12/28/21) Further plan will depend on formal evaluation as outpatient.   Per my evaluation, considering patient's severe deconditioning, heart failure and renal failure he likely is not candidate for aggressive surgical intervention.    Anemia Iron deficiency anemia.  Patient has received multiple doses of IV iron in June 6, 7, 11, and 12.   Follow up hgb is 8,0. Hold on PRBC transfusion unless HGb less than 7,0   Type 2 diabetes mellitus (HCC) Dyslipidemia.  His glucose has been stable. Continue glucose monitoring.  Patient with very poor oral intake.  Continue with statin therapy.   Atrial flutter (HCC) Paroxysmal atrial flutter. Plan to continue rate control with amiodarone and diltiazem. Continue anticoagulation with apixaban.   Protein-calorie malnutrition, moderate ([place]) Continue with nutritional supplements.         Subjective: Patient continue to be very weak and deconditioned, his appetite has mildly improved, no chest pain.  Physical Exam: Vitals:   12/28/21 2044 12/29/21 0520 12/29/21 0847 12/29/21 1119   BP: 123/62 134/67 (!) 151/69 135/66  Pulse: 67 71 76 74  Resp: '13 15 18 16  '$ Temp: 97.9 F (36.6 C) 97.9 F (36.6 C) 98 F (36.7 C) 98 F (36.7 C)  TempSrc:   Oral Oral  SpO2: 97% 96% 100% 96%  Weight:  50.7 kg    Height:       Neurology awake and alert, deconditioned ENT with positive pallor Cardiovascular with S1 and S2 present and rhythmic, positive systolic murmur 3/6 at the apex, no gallops Respiratory with scattered rales at bases with no wheezing Abdomen not distended No lower extremity edema  Data Reviewed:    Family Communication: I spoke with patient's son at the bedside, we talked in detail about patient's condition, plan of care and prognosis and all questions were addressed. We talked about patient's disposition, will continue to coordinate home health services and outpatient palliative care.  Plan discharge date is 12/31/21.    Disposition: Status is: Inpatient Remains inpatient appropriate because: renal failure and heart failure   Planned Discharge Destination: Home    Author: Tawni Millers, MD 12/29/2021 12:36 PM  For on call review [email or web address].

## 2021-12-29 NOTE — Inpatient Diabetes Management (Signed)
Inpatient Diabetes Program Recommendations  AACE/ADA: New Consensus Statement on Inpatient Glycemic Control (2015)  Target Ranges:  Prepandial:   less than 140 mg/dL      Peak postprandial:   less than 180 mg/dL (1-2 hours)      Critically ill patients:  140 - 180 mg/dL   Lab Results  Component Value Date   GLUCAP 311 (H) 12/29/2021   HGBA1C 7.4 (H) 11/23/2021    Review of Glycemic Control  Latest Reference Range & Units 12/28/21 05:35 12/28/21 11:46 12/28/21 15:54 12/28/21 21:54 12/29/21 06:22 12/29/21 11:16  Glucose-Capillary 70 - 99 mg/dL 163 (H) 298 (H) 163 (H) 257 (H) 152 (H) 311 (H)  (H): Data is abnormally high  Diabetes history: DM2 Outpatient Diabetes medications:  Glipizide 7.5 mg with breakfast Januvia 25 mg QD Current orders for Inpatient glycemic control:  Novolog 0-6 units TID Tradjenta 5 mg QD  Inpatient Diabetes Program Recommendations:    Noted poor PO intake.  When he eats his postprandials are elevated.  Please consider:  Novolog 3 units TID IF consumes at least 50%.  Will continue to follow while inpatient.  Thank you, Reche Dixon, MSN, [place] Diabetes Coordinator Inpatient Diabetes Program [phone] (team pager from 8a-5p)

## 2021-12-29 NOTE — Progress Notes (Signed)
   This pt has been referred to our [facility] program. This is a home-based Palliative care program that is provided by [facility]. We will follow the pt at home after discharge to assist with any chronic/acute symptom management needs. We utilize his PCP as the attending for this program. He will be getting nursing visits 1-3 times a month and SW support in the home with these services. He will also have after hours nursing support as well.    He is eligible for other [facility] services in home with our program in place if needed.   Webb Silversmith RN [phone]

## 2021-12-29 NOTE — Progress Notes (Signed)
TRH night cross cover note:   I was notified by RN that the patient's most recent bowel movement occurred 5 to 6 days ago in spite of interval lactulose therapy, and that the patient and his family are consequently requesting addition of a suppository to the patient's current bowel regimen.  I subsequently placed order for prn Dulcolax suppository.     Babs Bertin, DO Hospitalist

## 2021-12-30 DIAGNOSIS — N184 Chronic kidney disease, stage 4 (severe): Secondary | ICD-10-CM | POA: Diagnosis not present

## 2021-12-30 DIAGNOSIS — R339 Retention of urine, unspecified: Secondary | ICD-10-CM | POA: Diagnosis not present

## 2021-12-30 DIAGNOSIS — D447 Neoplasm of uncertain behavior of aortic body and other paraganglia: Secondary | ICD-10-CM | POA: Diagnosis not present

## 2021-12-30 DIAGNOSIS — J9611 Chronic respiratory failure with hypoxia: Secondary | ICD-10-CM

## 2021-12-30 DIAGNOSIS — I5033 Acute on chronic diastolic (congestive) heart failure: Secondary | ICD-10-CM

## 2021-12-30 DIAGNOSIS — N179 Acute kidney failure, unspecified: Secondary | ICD-10-CM | POA: Diagnosis not present

## 2021-12-30 DIAGNOSIS — I480 Paroxysmal atrial fibrillation: Secondary | ICD-10-CM | POA: Diagnosis not present

## 2021-12-30 DIAGNOSIS — Z515 Encounter for palliative care: Secondary | ICD-10-CM | POA: Diagnosis not present

## 2021-12-30 LAB — RENAL FUNCTION PANEL
Albumin: 3.2 g/dL — ABNORMAL LOW (ref 3.5–5.0)
Anion gap: 16 — ABNORMAL HIGH (ref 5–15)
BUN: 109 mg/dL — ABNORMAL HIGH (ref 8–23)
CO2: 35 mmol/L — ABNORMAL HIGH (ref 22–32)
Calcium: 9 mg/dL (ref 8.9–10.3)
Chloride: 83 mmol/L — ABNORMAL LOW (ref 98–111)
Creatinine, Ser: 5.16 mg/dL — ABNORMAL HIGH (ref 0.61–1.24)
GFR, Estimated: 11 mL/min — ABNORMAL LOW (ref >60)
Glucose, Bld: 117 mg/dL — ABNORMAL HIGH (ref 70–99)
Phosphorus: 6.6 mg/dL — ABNORMAL HIGH (ref 2.5–4.6)
Potassium: 3.8 mmol/L (ref 3.5–5.1)
Sodium: 134 mmol/L — ABNORMAL LOW (ref 135–145)

## 2021-12-30 LAB — GLUCOSE, CAPILLARY
Glucose-Capillary: 75 mg/dL (ref 70–99)
Glucose-Capillary: 301 mg/dL — ABNORMAL HIGH (ref 70–99)
Glucose-Capillary: 152 mg/dL — ABNORMAL HIGH (ref 70–99)
Glucose-Capillary: 314 mg/dL — ABNORMAL HIGH (ref 70–99)

## 2021-12-30 NOTE — Progress Notes (Signed)
Physical Therapy Treatment Patient Details Name: Adam Reid MRN: [id number] DOB: [birth year] Today's Date: 12/30/2021   History of Present Illness The pt is a 76 yo male presenting 6/4 with SOB and hypoxia. Work up revealed elevated BNP, acute CHF exacerbation, and worsening renal function with retroperitoneal mass. PMH includes: CKD IV, CHF, DM II, hypoxic respiratory failure on 2-2.5L, HTN, PAF on Eliquis, and malnutrition.    PT Comments    Pt tolerates treatment well, ambulating for household distances without physical assistance. Pt denies symptoms of fatigue when mobilizing, however the pt also reports utilizing a walker and oxygen at baseline which he had denied during evaluation. Pt will benefit from continued acute PT services in an effort to improve activity tolerance and reduce caregiver burden.  Recommendations for follow up therapy are one component of a multi-disciplinary discharge planning process, led by the attending physician.  Recommendations may be updated based on patient status, additional functional criteria and insurance authorization.  Follow Up Recommendations  No PT follow up     Assistance Recommended at Discharge Set up Supervision/Assistance  Patient can return home with the following A little help with walking and/or transfers;A little help with bathing/dressing/bathroom;Assistance with cooking/housework;Assist for transportation;Help with stairs or ramp for entrance   Equipment Recommendations  None recommended by PT    Recommendations for Other Services       Precautions / Restrictions Precautions Precautions: Fall Precaution Comments: monitor sats Restrictions Weight Bearing Restrictions: No     Mobility  Bed Mobility Overal bed mobility: Needs Assistance Bed Mobility: Supine to Sit     Supine to sit: Supervision, HOB elevated          Transfers Overall transfer level: Needs assistance Equipment used: Rolling walker (2  wheels) Transfers: Sit to/from Stand Sit to Stand: Supervision                Ambulation/Gait Ambulation/Gait assistance: Supervision Gait Distance (Feet): 200 Feet Assistive device: Rolling walker (2 wheels) Gait Pattern/deviations: Step-through pattern Gait velocity: reduced Gait velocity interpretation: <1.8 ft/sec, indicate of risk for recurrent falls   General Gait Details: pt with slowed step-through gait   Stairs             Wheelchair Mobility    Modified Rankin (Stroke Patients Only)       Balance Overall balance assessment: Needs assistance Sitting-balance support: No upper extremity supported, Feet supported Sitting balance-Leahy Scale: Good     Standing balance support: Bilateral upper extremity supported, Reliant on assistive device for balance Standing balance-Leahy Scale: Poor                              Cognition Arousal/Alertness: Awake/alert Behavior During Therapy: WFL for tasks assessed/performed Overall Cognitive Status: Within Functional Limits for tasks assessed                                          Exercises      General Comments General comments (skin integrity, edema, etc.): pt on 3L [place] at rest upon PT arrival, PT weans to 2L [place] with mobility and pt sats in low 90s during session      Pertinent Vitals/Pain Pain Assessment Pain Assessment: No/denies pain    Home Living  Prior Function            PT Goals (current goals can now be found in the care plan section) Acute Rehab PT Goals Patient Stated Goal: home PT Goal Formulation: With patient Time For Goal Achievement: 01/13/22 Potential to Achieve Goals: Good Progress towards PT goals: Progressing toward goals    Frequency    Min 3X/week      PT Plan Current plan remains appropriate    Co-evaluation              AM-PAC PT "6 Clicks" Mobility   Outcome Measure  Help needed  turning from your back to your side while in a flat bed without using bedrails?: None Help needed moving from lying on your back to sitting on the side of a flat bed without using bedrails?: A Little Help needed moving to and from a bed to a chair (including a wheelchair)?: A Little Help needed standing up from a chair using your arms (e.g., wheelchair or bedside chair)?: A Little Help needed to walk in hospital room?: A Little Help needed climbing 3-5 steps with a railing? : A Little 6 Click Score: 19    End of Session Equipment Utilized During Treatment: Oxygen Activity Tolerance: Patient tolerated treatment well Patient left: in chair;with call bell/phone within reach Nurse Communication: Mobility status PT Visit Diagnosis: Muscle weakness (generalized) (M62.81)     Time: 2119-4174 PT Time Calculation (min) (ACUTE ONLY): 21 min  Charges:  $Gait Training: 8-22 mins                     Zenaida Niece, PT, DPT Acute Rehabilitation Office [phone]    Zenaida Niece 12/30/2021, 1:37 PM

## 2021-12-30 NOTE — Progress Notes (Signed)
Patient ID: [name], male   DOB: [birth year], 76 y.o.   MRN: [id number]    Progress Note from the Palliative Medicine Team at [facility]   Patient Name: Adam Reid        Date: 12/30/2021 DOB: [birth year]  Age: 76 y.o. MRN#: [id number] Attending Physician: [name], MD Primary Care Physician: [name], MD Admit Date: 12/13/2021   Medical records reviewed,   discussed case with attending   Per intake H&P - 76 year old male with a past medical history of chronic diastolic CHF, BPH, anemia chronic disease, stage IV CKD, type II DM, depression, hypertension, history of GI bleed, paroxysmal atrial fibrillation, secondary hyper parathyroidism, thyroid disease, history of cigarette smoking who presented to the emergency department with urinary burning, dyspnea and progressively worse edema.  Work-up shows BNP of over 2000 pg/mL, worsening renal function and retroperitoneal mass.    Palliative care asked to get involved in the setting of worsening kidney function and large left retroperitoneal paraglioma.   This NP visited patient at the bedside as a follow up to for palliative medicine needs and emotional support, and for continued conversation regarding current medical situation; treatment option decisions, and anticipatory care needs.  I have met with patient's wife and son on multiple occasions helping them navigate this hospital stay.  Today is day 17 of this hospital stay.  Today I spoke to patient's wife and son regarding current medical situation and family's plan moving forward. Family verbalized an understanding of the seriousness of the patient's medical situation specific to; para glioma, renal impairment, diabetes and overall failure to thrive.  Patient and his family remain hopeful for improvement.  Family have secured a follow-up visit with Dr. Emmaline Kluver next Thursday and will follow with outpatient nephrology.  Patient has been referred to  [facility] for outpatient palliative services.  Education offered today on hospice benefit; philosophy and eligibility   Plan of care: -DNR/DNI -Continue current medical interventions, treat the treatable and hope for improvement, hopes to avoid dialysis but would consider "if life or death' -Discharge home, son plans to secure in-home nursing care to assist his mother  - OP community-based palliative services/ [facility] -Follow-up with outpatient  specialists -All decisions into the future depending on patient outcomes   Again today education offered to both wife and son,  regarding my concern for patient's overall continued failure to thrive and high risk for decompensation.  Family encouraged to continue exploring with patient his personal goals of care and desire to seek aggressive life-prolonging measures.  Questions and concerns addressed   Discussed with Dr Sarajane Jews and [facility] team    PMT will continue to support holistically   Wadie Lessen NP  Palliative Medicine Team Team Phone # [phone] Pager [phone]

## 2021-12-30 NOTE — Assessment & Plan Note (Signed)
--  on 2-2.5L at home

## 2021-12-30 NOTE — Progress Notes (Signed)
Progress Note   Patient: Adam Reid MGQ:[id number] DOB: 05-07-1946 DOA: 12/13/2021     17 DOS: the patient was seen and examined on 12/30/2021   Brief hospital course: 76 year old man PMH paraganglioma diagnosed May 2023 with plans for outpatient follow-up with oncology in [facility], chronic diastolic CHF diabetes mellitus type 2, PAF, recently diagnosed retroperitoneal mass, presented with difficulty urinating and leg swelling.  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 Seen by cardiology for treatment of acute CHF, oncology for recommendations for paraganglioma (recommended general surgery evaluation for removal of the retroperitoneal mass.  Outpatient follow-up with endocrinology.) seen by nephrology for AKI/CKD stage IV, combination of urinary retention and cardiorenal syndrome. General surgery recommended tertiary care center. Condition stabilized. Plan for home tomorrow with outpatient follow-up.  Assessment and Plan: Acute on chronic diastolic CHF(HCC) --Echo LVEF 55 to 60%.  --continue diuretic; appears euvolemic  AKI superimposed on CKD stage IV   --presumably combination of urinary retention and cardiorenal syndrome in the setting of acute on chronic CHF --Caude catheter placed 06/06, after recurrent urinary retention post discontinuation of foley catheter. Exchanged caude cathter on 06/10 due to dysuria.  --Surgery oncology as outpatient Dr. Birdie Hopes. Patient will be discharged with urinary catheter. --Patient does not want hemodialysis.  --Torsemide '40mg'$ /'40mg'$  on 6/15 as he will need to be on that at d/c.  --per nephrology no absolute indications for RRT and high likelihood that if he's started on dialysis that it would be permanent.  --Will need f/u w/ Dr. [name] ([facility]) in 2 weeks as well upon dc; he already has an appt set up. Will sign off at this time; please reconsult as needed.    Paraganglioma [facility]) --seen by  oncology,  typically is not taken care of by medical oncology unless it has metastasized. Recommended surgical consult for resection and outpatient follow-up with endocrinology. Paragangliomas are similar to pheochromocytomas --Sympathetic paraganglioma, catecolamine secreting tumor. Continue with alpha blockade. --Dr. Cathlean Sauer discussed with Dr Scarlette Slice (12/28/21) Further plan will depend on formal evaluation as outpatient.   Paroxysmal atrial fibrillation (HCC) --Continue Amiodarone '200mg'$  daily. --Continue Eliquis 2.'5mg'$  twice daily. Reduced dose due to renal function and weight.  Chronic respiratory failure with hypoxia (HCC) --on 2-2.5L at home  Anemia Iron deficiency anemia and CKD --s/p IV iron in June 6, 7, 11, and 12.   Type 2 diabetes mellitus (HCC) --CBG stable  Atrial flutter (HCC) --rate control with amiodarone and diltiazem. --Continue apixaban.   Protein-calorie malnutrition, moderate (HCC)     Subjective:  Feels ok No complaints voiced  Physical Exam: Vitals:   12/30/21 0433 12/30/21 0500 12/30/21 0900 12/30/21 1107  BP: (!) 148/71  132/70 124/64  Pulse:   71 75  Resp: (!) '22  19 16  '$ Temp: 98.1 F (36.7 C)  98.2 F (36.8 C) 98.1 F (36.7 C)  TempSrc: Oral  Oral Oral  SpO2:   100% 98%  Weight:  48.1 kg    Height:       Physical Exam Vitals reviewed.  Constitutional:      General: He is not in acute distress.    Appearance: He is not ill-appearing or toxic-appearing.  Cardiovascular:     Rate and Rhythm: Normal rate and regular rhythm.     Heart sounds: No murmur heard.    Comments: Telemetry SR Pulmonary:     Effort: Pulmonary effort is normal. No respiratory distress.     Breath sounds: No wheezing,  rhonchi or rales.  Neurological:     Mental Status: He is alert.  Psychiatric:        Mood and Affect: Mood normal.        Behavior: Behavior normal.    Data Reviewed:  UOP 2950 Creatinine stable 5.16  Family Communication: updated son by  telephone  Disposition: Status is: Inpatient Remains inpatient appropriate because: discharge planned tomorrow  Planned Discharge Destination: Home    Time spent: 45 minutes  Author: Murray Hodgkins, MD 12/30/2021 6:59 PM  For on call review [email or web address].

## 2021-12-30 NOTE — Assessment & Plan Note (Signed)
---   Continue Amiodarone '200mg'$  daily. - Continue Eliquis 2.'5mg'$  twice daily. Reduced dose due to renal function and weight.

## 2021-12-31 ENCOUNTER — Encounter (HOSPITAL_COMMUNITY): Payer: Self-pay

## 2021-12-31 ENCOUNTER — Other Ambulatory Visit (HOSPITAL_COMMUNITY): Payer: Self-pay

## 2021-12-31 LAB — GLUCOSE, CAPILLARY
Glucose-Capillary: 182 mg/dL — ABNORMAL HIGH (ref 70–99)
Glucose-Capillary: 282 mg/dL — ABNORMAL HIGH (ref 70–99)
Glucose-Capillary: 177 mg/dL — ABNORMAL HIGH (ref 70–99)
Glucose-Capillary: 181 mg/dL — ABNORMAL HIGH (ref 70–99)
Glucose-Capillary: 313 mg/dL — ABNORMAL HIGH (ref 70–99)

## 2021-12-31 MED ORDER — BISACODYL 10 MG RE SUPP: 10.0000 mg | RECTAL | 0 refills | Status: AC | PRN

## 2021-12-31 MED ORDER — INSULIN GLARGINE 100 UNIT/ML SOLOSTAR PEN: 5.0000 [IU] | PEN_INJECTOR | Freq: Every day | SUBCUTANEOUS | 1 refills | Status: DC

## 2021-12-31 MED ORDER — HYDROMORPHONE HCL 2 MG PO TABS: 1.0000 mg | ORAL_TABLET | Freq: Two times a day (BID) | ORAL | 0 refills | Status: AC | PRN

## 2021-12-31 MED ORDER — INSULIN PEN NEEDLE 31G X 5 MM MISC: 0 refills | Status: AC

## 2021-12-31 MED ORDER — ONDANSETRON HCL 4 MG PO TABS
4.0000 mg | ORAL_TABLET | Freq: Once | ORAL | Status: DC
Start: 2021-12-31 — End: 2021-12-31

## 2021-12-31 MED ORDER — LACTULOSE 10 GM/15ML PO SOLN: 10.0000 g | Freq: Two times a day (BID) | ORAL | 2 refills | Status: AC

## 2021-12-31 NOTE — TOC Benefit Eligibility Note (Signed)
Patient Teacher, English as a foreign language completed.    The patient is currently admitted and upon discharge could be taking Lantus SoloStar Pen.  The current 30 day co-pay is, $0.00.   The patient is insured through [employer], [place] Patient Advocate Specialist [place] Patient Advocate Team Direct Number: [phone]  Fax: [phone]

## 2021-12-31 NOTE — Discharge Summary (Signed)
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  --Discharged with urinary catheter. Will need follow-up with urology for maintenance and exchange --f/u w/ Dr. [name] ([facility]) in 2 weeks as well upon dc; he already has an appt set up.     Paraganglioma [facility]) --seen by oncology, typically is not taken care of by medical oncology unless it has metastasized. Recommended surgical consult for resection and outpatient follow-up with endocrinology. Paragangliomas are similar to pheochromocytomas --Sympathetic paraganglioma, catecolamine secreting tumor. Continue with alpha blockade. --Dr. Cathlean Sauer discussed with Dr Scarlette Slice (12/28/21) Further plan will depend on formal evaluation as outpatient.  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 Seen by cardiology for treatment of acute CHF, oncology for recommendations for paraganglioma (recommended general surgery evaluation for removal of the retroperitoneal mass; outpatient follow-up with endocrinology), seen by nephrology for AKI/CKD stage IV, combination of urinary retention and cardiorenal syndrome. General surgery recommended tertiary care center. Condition stabilized. Discharged home with [facility] and outpatient referral to palliative care.  Acute on chronic diastolic CHF(HCC) --Echo LVEF 55 to 60%.  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 Exchanged caude catheter on 06/10 due to dysuria.  --Surgery oncology as outpatient Dr. Scarlette Slice. Discharged with urinary catheter. --Patient does not want hemodialysis.  --Torsemide 64m/12m; per nephrology no absolute indications for RRT and high likelihood that if started on dialysis that it would be permanent.  --f/u w/ Dr. PPosey Pronto([facility]) in 2 weeks as well upon dc; he already has an appt set up.     Paraganglioma ([facility] --seen by oncology, typically is not taken care of by medical oncology unless it has metastasized. Recommended surgical consult for resection and outpatient follow-up with endocrinology. Paragangliomas are similar to pheochromocytomas --Sympathetic paraganglioma, catecolamine secreting tumor. Continue with alpha blockade. --Dr. ACathlean Sauerdiscussed with Dr RScarlette Slice(12/28/21) Further plan will depend on formal evaluation as outpatient.    Paroxysmal atrial fibrillation (HCC) --Continue Amiodarone 2037mdaily. --Continue Eliquis 2.28m63mwice daily. Reduced dose  due to renal function and weight.   Chronic respiratory failure with hypoxia (HCC) --on 2-2.5L at home   Anemia Iron deficiency anemia and CKD --s/p IV iron in June  6, 7, 11, and 12.    Type 2 diabetes mellitus (HCC) --CBG stable; oral medications discontinued; started on insulin; discussed with son   Atrial flutter ([place]) --rate control with amiodarone and diltiazem. --Continue apixaban.    Protein-calorie malnutrition, moderate ([place])      Pain control - [place] Controlled Substance Reporting System database was reviewed.  Consultants:  Cardiology Nephrology Oncology  Procedures performed:  None    Disposition: Home HH RN disease management, aide, outpatient palliative care service  Diet recommendation:  Discharge Diet Orders (From admission, onward)     Start     Ordered   12/31/21 0000  Diet Carb Modified        12/31/21 1244   12/23/21 0000  Diet - low sodium heart healthy        12/23/21 1302           Cardiac and Carb modified diet DISCHARGE MEDICATION: Allergies as of 12/31/2021       Reactions   Pork-derived Products Other (See Comments)   Patient "just does not eat this"        Medication List     STOP taking these medications    citalopram 20 MG tablet Commonly known as: CELEXA   glipiZIDE 2.5 MG 24 hr tablet Commonly known as: GLUCOTROL XL   glipiZIDE 5 MG 24 hr tablet Commonly known as: GLUCOTROL XL   tamsulosin 0.4 MG Caps capsule Commonly known as: FLOMAX       TAKE these medications    amiodarone 200 MG tablet Commonly known as: PACERONE Take 1 tablet (200 mg total) by mouth daily. Follow up appt required for further refills. Please call [phone] to schedule an appt What changed: additional instructions   atorvastatin 80 MG tablet Commonly known as: LIPITOR TAKE 1 TABLET(80 MG) BY MOUTH DAILY What changed:  how much to take how to take this when to take this additional instructions   bisacodyl 10 MG  suppository Commonly known as: Dulcolax Place 1 suppository (10 mg total) rectally as needed for moderate constipation.   blood glucose meter kit and supplies Use up to two times daily as directed  ICD10 E10.9 E11.9   Calphron 667 MG tablet Generic drug: calcium acetate Take 667 mg by mouth 3 (three) times daily.   diltiazem 180 MG 24 hr capsule Commonly known as: CARDIZEM CD TAKE 1 CAPSULE(180 MG) BY MOUTH DAILY What changed: See the new instructions.   doxazosin 4 MG tablet Commonly known as: CARDURA Take 1 tablet (4 mg total) by mouth daily.   Eliquis 2.5 MG Tabs tablet Generic drug: apixaban TAKE 1 TABLET(2.5 MG) BY MOUTH TWICE DAILY What changed: See the new instructions.   finasteride 5 MG tablet Commonly known as: PROSCAR Take 1 tablet (5 mg total) by mouth daily.   GlucoCom Lancets Misc Use for home glucose monitoring   HYDROmorphone 2 MG tablet Commonly known as: DILAUDID Take 0.5 tablets (1 mg total) by mouth every 12 (twelve) hours as needed for moderate pain.   insulin glargine 100 UNIT/ML Solostar Pen Commonly known as: LANTUS Inject 5 Units into the skin daily.   Insulin Pen Needle 31G X 5 MM Misc Use as directed   lactulose 10 GM/15ML solution Commonly known as: CHRONULAC Take 15 mLs (10 g total) by mouth 2 (two) times daily.   OneTouch Verio test strip Generic drug: glucose blood Test up to 2 times per day.  Uncontrolled diabetes with hyperglycemia and stage 4 CKD.  polyethylene glycol 17 g packet Commonly known as: MIRALAX / GLYCOLAX Take 17 g by mouth daily as needed for mild constipation or moderate constipation (MIX AND DRINK).   senna-docusate 8.6-50 MG tablet Commonly known as: Senokot-S Take 1 tablet by mouth 2 (two) times daily.   sitaGLIPtin 25 MG tablet Commonly known as: Januvia TAKE 1 TABLET(25 MG) BY MOUTH DAILY What changed:  how much to take how to take this when to take this additional instructions   torsemide 20 MG  tablet Commonly known as: DEMADEX Take 2 tablets (40 mg total) by mouth 2 (two) times daily.   vitamin B-12 500 MCG tablet Commonly known as: CYANOCOBALAMIN Take 500 mcg by mouth daily.   VITAMIN D3 PO Take 1 tablet by mouth daily.        Follow-up Information     Stark Klein, MD Follow up.   Specialty: General Surgery Why: Call to confirm your appointment date and time. We are working hard to make this for you. Please bring a copy of your photo ID and insurance card. Please arrive 30 minutes prior to your appointment for paperwork. Contact information: [address] [place] [address] [phone]         Elouise Munroe, MD .   Specialties: Cardiology, Radiology Contact information: [address] [place] [address] [phone]         [name], MD Follow up.   Specialties: Family Medicine, Sports Medicine Why: Please follow up in a week. Contact information: [place] [address] [phone]         [facility], [facility] Follow up.   Specialty: Home Health Services Why: [place], [place], [place] will call you to coordinate apt times Contact information: [place] [address] [phone]         [facility] Follow up.   Specialty: PALLIATIVE CARE Why: outpatient palliative services Contact information: [address] [facility] [address] [phone]               Feels ok today Seen with patient's wife and son at bedside Reviewed discharge meds with son  Discharge Exam: Filed Weights   12/29/21 0520 12/30/21 0500 12/31/21 0245  Weight: 50.7 kg 48.1 kg 50 kg   Physical Exam Vitals reviewed.  Constitutional:      General: He is not in acute distress.    Appearance: He is not ill-appearing or toxic-appearing.  Cardiovascular:     Rate and Rhythm: Normal rate and regular rhythm.     Heart sounds: No  murmur heard. Pulmonary:     Effort: Pulmonary effort is normal. No respiratory distress.     Breath sounds: No wheezing, rhonchi or rales.  Neurological:     Mental Status: He is alert.  Psychiatric:        Mood and Affect: Mood normal.        Behavior: Behavior normal.   UOP 1125 CBG stable   Condition at discharge: good  The results of significant diagnostics from this hospitalization (including imaging, microbiology, ancillary and laboratory) are listed below for reference.   Imaging Studies: CT ABDOMEN PELVIS WO CONTRAST  Result Date: 12/13/2021 CLINICAL DATA:  76 year old male with history of abnormal chest x-ray. Pneumonia. Pleural effusion. Suspected malignancy. EXAM: CT CHEST, ABDOMEN AND PELVIS WITHOUT CONTRAST TECHNIQUE: Multidetector CT imaging of the chest, abdomen and pelvis was performed following the standard protocol without IV  contrast. RADIATION DOSE REDUCTION: This exam was performed according to the departmental dose-optimization program which includes automated exposure control, adjustment of the mA and/or kV according to patient size and/or use of iterative reconstruction technique. COMPARISON:  Chest CT 11/15/2021. CT the abdomen and pelvis 11/17/2021. FINDINGS: Comment: Today's study is severely limited for detection and characterization of visceral and/or vascular lesions by lack of IV contrast. CT CHEST FINDINGS Cardiovascular: Heart size is mildly enlarged. There is no significant pericardial fluid, thickening or pericardial calcification. There is aortic atherosclerosis, as well as atherosclerosis of the great vessels of the mediastinum and the coronary arteries, including calcified atherosclerotic plaque in the left main, left anterior descending, left circumflex and right coronary arteries. Mediastinum/Nodes: Multiple prominent borderline enlarged mediastinal lymph nodes are noted, nonspecific, but similar to the recent prior study, favored to be reactive. No  definite pathologically enlarged mediastinal or hilar lymph nodes are confidently identified on today's noncontrast CT examination. Esophagus is unremarkable in appearance. No axillary lymphadenopathy. Lungs/Pleura: Moderate right chronic partially loculated pleural effusion and small left chronic partially loculated pleural effusion, both of which are stable compared to the prior study. Both of these are associated with extensive areas of pleuroparenchymal thickening and nodular/mass-like architectural distortion in the lungs bilaterally, with associated pleural tails", most compatible with extensive areas of rounded atelectasis. There continues to be some patchy multifocal ground-glass attenuation in the lungs bilaterally, some of which appears improved compared to the prior study, but other areas have clearly increased compared to the prior examination, with some associated septal thickening, most evident in the anterior aspect of the right upper lobe (best appreciated on axial image 58 of series 3). Musculoskeletal: There are no aggressive appearing lytic or blastic lesions noted in the visualized portions of the skeleton. CT ABDOMEN PELVIS FINDINGS Hepatobiliary: No definite suspicious appearing hepatic lesions are confidently identified on today's noncontrast CT examination. Unenhanced appearance of the gallbladder is unremarkable. Pancreas: No definite pancreatic mass or peripancreatic fluid collections or inflammatory changes are confidently identified on today's noncontrast CT examination. Spleen: Unremarkable. Adrenals/Urinary Tract: Low-attenuation lesions in the upper pole of the right kidney measuring up to 2.9 x 2.3 cm, incompletely characterized on today's non-contrast CT examination, but statistically likely to represent cysts. Other subcentimeter high attenuation lesions are noted in the left kidney, incompletely characterized, but favored to represent proteinaceous/hemorrhagic cysts.  Low-attenuation adreniform thickening bilaterally, favored to represent adenomatous hyperplasia. Large left retroperitoneal mass (discussed below) which is favored to be separate from the left adrenal gland, although this does make contact with the anterior aspect of the left adrenal gland such that an adrenal lesion is not entirely excluded. No hydroureteronephrosis. Urinary bladder is markedly distended, but otherwise unremarkable in appearance on today's noncontrast CT examination. Stomach/Bowel: The appearance of the stomach is normal. No pathologic dilatation of small bowel or colon. The appendix is not confidently identified and may be surgically absent. Regardless, there are no inflammatory changes noted adjacent to the cecum to suggest the presence of an acute appendicitis at this time. Vascular/Lymphatic: Aortic atherosclerosis. Large left para-aortic retroperitoneal mass could represent malignant centrally necrotic lymphadenopathy, however, this is favored to represent a primary retroperitoneal lesion (see discussion below). No other lymphadenopathy noted elsewhere in the abdomen or pelvis. Reproductive: Prostate gland and seminal vesicles are unremarkable in appearance. Other: Again noted is a large left-sided para-aortic retroperitoneal mass measuring approximately 6.9 x 6.1 x 7.9 cm (axial image 27 of series 2 and coronal image 38 of series 5), previously 6.7 x  5.4 x 6.9 cm). This is intimately associated with the adjacent structures, including the abdominal aorta left renal artery, proximal superior mesenteric artery, anterior aspect of the left adrenal gland, and multiple small bowel loops. The lesion appears separate from the pancreas. The lesion is heterogeneous in attenuation with some central areas that are lower attenuation (21 HU) and peripheral intermediate attenuation (38 HU). No significant volume of ascites. No pneumoperitoneum. Musculoskeletal: There are no aggressive appearing lytic or  blastic lesions noted in the visualized portions of the skeleton. IMPRESSION: 1. 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 This may represent a primary retroperitoneal neoplasm, but differential considerations are very broad given the location, and include malignant lymphadenopathy, vascular lesions, lesions of adrenal origin, gastrointestinal lesion of the small bowel, etc. Further evaluation with abdominal MRI with and without IV gadolinium is recommended to better characterize this finding. 2. Shifting pattern of ground-glass attenuation and septal thickening in the lungs is of uncertain etiology and significance, but is overall slightly worsened and likely of infectious or inflammatory etiology. Infiltrative neoplasm such as metastatic adenocarcinoma is difficult to entirely exclude. 3. Chronic bilateral pleural effusions with extensive rounded atelectasis in both lungs, similar to the prior study. 4. Mild cardiomegaly. 5. Aortic atherosclerosis, in addition to left main and three-vessel coronary artery disease. Assessment for potential risk factor modification, dietary therapy or pharmacologic therapy may be warranted, if clinically indicated. 6. Additional incidental findings, as above. Electronically Signed   By: Vinnie Langton M.D.   On: 12/13/2021 13:31   CT Chest Wo Contrast  Result Date: 12/13/2021 CLINICAL DATA:  76 year old male with history of abnormal chest x-ray. Pneumonia. Pleural effusion. Suspected malignancy. EXAM: CT CHEST, ABDOMEN AND PELVIS WITHOUT CONTRAST TECHNIQUE: Multidetector CT imaging of the chest, abdomen and pelvis was performed following the standard protocol without IV contrast. RADIATION DOSE REDUCTION: This exam was performed according to the departmental dose-optimization program which includes automated exposure control, adjustment of  the mA and/or kV according to patient size and/or use of iterative reconstruction technique. COMPARISON:  Chest CT 11/15/2021. CT the abdomen and pelvis 11/17/2021. FINDINGS: Comment: Today's study is severely limited for detection and characterization of visceral and/or vascular lesions by lack of IV contrast. CT CHEST FINDINGS Cardiovascular: Heart size is mildly enlarged. There is no significant pericardial fluid, thickening or pericardial calcification. There is aortic atherosclerosis, as well as atherosclerosis of the great vessels of the mediastinum and the coronary arteries, including calcified atherosclerotic plaque in the left main, left anterior descending, left circumflex and right coronary arteries. Mediastinum/Nodes: Multiple prominent borderline enlarged mediastinal lymph nodes are noted, nonspecific, but similar to the recent prior study, favored to be reactive. No definite pathologically enlarged mediastinal or hilar lymph nodes are confidently identified on today's noncontrast CT examination. Esophagus is unremarkable in appearance. No axillary lymphadenopathy. Lungs/Pleura: Moderate right chronic partially loculated pleural effusion and small left chronic partially loculated pleural effusion, both of which are stable compared to the prior study. Both of these are associated with extensive areas of pleuroparenchymal thickening and nodular/mass-like architectural distortion in the lungs bilaterally, with associated pleural tails", most compatible with extensive areas of rounded atelectasis. There continues to be some patchy multifocal ground-glass attenuation in the lungs bilaterally, some of which appears improved compared to the prior study, but other areas have clearly increased compared to the prior examination, with some associated septal thickening, most evident in  the anterior aspect of the right upper lobe (best appreciated on axial image 58 of series 3). Musculoskeletal: There are no  aggressive appearing lytic or blastic lesions noted in the visualized portions of the skeleton. CT ABDOMEN PELVIS FINDINGS Hepatobiliary: No definite suspicious appearing hepatic lesions are confidently identified on today's noncontrast CT examination. Unenhanced appearance of the gallbladder is unremarkable. Pancreas: No definite pancreatic mass or peripancreatic fluid collections or inflammatory changes are confidently identified on today's noncontrast CT examination. Spleen: Unremarkable. Adrenals/Urinary Tract: Low-attenuation lesions in the upper pole of the right kidney measuring up to 2.9 x 2.3 cm, incompletely characterized on today's non-contrast CT examination, but statistically likely to represent cysts. Other subcentimeter high attenuation lesions are noted in the left kidney, incompletely characterized, but favored to represent proteinaceous/hemorrhagic cysts. Low-attenuation adreniform thickening bilaterally, favored to represent adenomatous hyperplasia. Large left retroperitoneal mass (discussed below) which is favored to be separate from the left adrenal gland, although this does make contact with the anterior aspect of the left adrenal gland such that an adrenal lesion is not entirely excluded. No hydroureteronephrosis. Urinary bladder is markedly distended, but otherwise unremarkable in appearance on today's noncontrast CT examination. Stomach/Bowel: The appearance of the stomach is normal. No pathologic dilatation of small bowel or colon. The appendix is not confidently identified and may be surgically absent. Regardless, there are no inflammatory changes noted adjacent to the cecum to suggest the presence of an acute appendicitis at this time. Vascular/Lymphatic: Aortic atherosclerosis. Large left para-aortic retroperitoneal mass could represent malignant centrally necrotic lymphadenopathy, however, this is favored to represent a primary retroperitoneal lesion (see discussion below). No other  lymphadenopathy noted elsewhere in the abdomen or pelvis. Reproductive: Prostate gland and seminal vesicles are unremarkable in appearance. Other: Again noted is a large left-sided para-aortic retroperitoneal mass measuring approximately 6.9 x 6.1 x 7.9 cm (axial image 27 of series 2 and coronal image 38 of series 5), previously 6.7 x 5.4 x 6.9 cm). This is intimately associated with the adjacent structures, including the abdominal aorta left renal artery, proximal superior mesenteric artery, anterior aspect of the left adrenal gland, and multiple small bowel loops. The lesion appears separate from the pancreas. The lesion is heterogeneous in attenuation with some central areas that are lower attenuation (21 HU) and peripheral intermediate attenuation (38 HU). No significant volume of ascites. No pneumoperitoneum. Musculoskeletal: There are no aggressive appearing lytic or blastic lesions noted in the visualized portions of the skeleton. IMPRESSION: 1. 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 This may represent a primary retroperitoneal neoplasm, but differential considerations are very broad given the location, and include malignant lymphadenopathy, vascular lesions, lesions of adrenal origin, gastrointestinal lesion of the small bowel, etc. Further evaluation with abdominal MRI with and without IV gadolinium is recommended to better characterize this finding. 2. Shifting pattern of ground-glass attenuation and septal thickening in the lungs is of uncertain etiology and significance, but is overall slightly worsened and likely of infectious or inflammatory etiology. Infiltrative neoplasm such as metastatic adenocarcinoma is difficult to entirely exclude. 3. Chronic bilateral pleural effusions with extensive rounded atelectasis in both lungs, similar to the prior study. 4. Mild  cardiomegaly. 5. Aortic atherosclerosis, in addition to left main and three-vessel coronary artery disease. Assessment for potential risk factor modification, dietary therapy or pharmacologic therapy may be warranted, if clinically indicated. 6. Additional incidental findings, as above. Electronically Signed  By: Vinnie Langton M.D.   On: 12/13/2021 13:31   DG Chest Portable 1 View  Result Date: 12/13/2021 CLINICAL DATA:  Shortness of breath.  Renal failure. EXAM: PORTABLE CHEST 1 VIEW COMPARISON:  12/10/2021 radiograph And prior studies FINDINGS: Cardiomediastinal silhouette is unchanged. Pulmonary vascular congestion again noted. Moderate RIGHT pleural effusion and small to moderate LEFT pleural effusion are again noted. Unchanged bilateral pulmonary opacities/scarring again identified. Nodular opacity overlying the RIGHT UPPER lung appears slightly more prominent which is likely technical. There is no evidence of pneumothorax or acute bony abnormality. IMPRESSION: Little significant change in appearance of the chest with pulmonary vascular congestion, bilateral pleural effusions and unchanged bilateral pulmonary opacities. Electronically Signed   By: Margarette Canada M.D.   On: 12/13/2021 10:49   DG Chest 2 View  Result Date: 12/10/2021 CLINICAL DATA:  Pleural effusions, weight gain pain, edema, CHF EXAM: CHEST - 2 VIEW COMPARISON:  11/19/2021 FINDINGS: Upper normal heart size. Atherosclerotic calcification and mild tortuosity of thoracic aorta. Pulmonary vascularity normal. Extensive scarring in the mid lungs bilaterally with BILATERAL pleural effusions, larger on RIGHT. Bibasilar infiltrates and atelectasis. Nodular focus versus atelectasis in the RIGHT upper lobe again identified. Posterior opacity in the mid to lower chest on lateral view unchanged. No pneumothorax. IMPRESSION: Persistent BILATERAL pleural effusions larger on RIGHT. BILATERAL pulmonary scarring with persistent bibasilar infiltrates and  atelectasis. Persistent nodular density or atelectasis at lateral RIGHT upper lobe. Electronically Signed   By: Lavonia Dana M.D.   On: 12/10/2021 14:17    Microbiology: Results for orders placed or performed during the hospital encounter of 12/13/21  Urine Culture     Status: None   Collection Time: 12/14/21  6:41 AM   Specimen: In/Out Cath Urine  Result Value Ref Range Status   Specimen Description IN/OUT CATH URINE  Final   Special Requests NONE  Final   Culture   Final    NO GROWTH Performed at [facility], [place] [address], [place], [place] [address]    Report Status 12/15/2021 FINAL  Final  Urine Culture     Status: None   Collection Time: 12/19/21  7:22 AM   Specimen: Urine, Catheterized  Result Value Ref Range Status   Specimen Description URINE, CATHETERIZED  Final   Special Requests NONE  Final   Culture   Final    NO GROWTH Performed at [facility] [address], [place], [place] [address]    Report Status 12/20/2021 FINAL  Final    Labs: CBC: Recent Labs  Lab 12/25/21 0335 12/26/21 0206 12/27/21 0321 12/29/21 0537  WBC 9.5 9.3 8.8  --   HGB 7.7* 7.6* 7.2* 8.0*  HCT 23.9* 22.9* 22.4* 24.1*  MCV 87.2 86.4 86.2  --   PLT 261 257 272  --    Basic Metabolic Panel: Recent Labs  Lab 12/26/21 0206 12/27/21 0321 12/28/21 0422 12/29/21 0537 12/30/21 0328  NA 129*  129* 131* 130* 132* 134*  K 3.5  3.5 3.5 3.4* 3.1* 3.8  CL 85*  84* 84* 82* 79* 83*  CO2 _0 34* 35*  GLUCOSE 154*  152* 187* 143* 147* 117*  BUN 106*  108* 109* 107* 109* 109*  CREATININE 5.18*  5.19* 5.06* 5.11* 5.15* 5.16*  CALCIUM 8.3*  8.4* 8.2* 8.5* 8.9 9.0  PHOS 6.3* 6.3* 7.0* 6.5* 6.6*   Liver Function Tests: Recent Labs  Lab 12/26/21 0206 12/27/21 0321 12/28/21 0422 12/29/21 0537 12/30/21  9201  ALBUMIN 3.1* 3.0* 3.1* 3.2* 3.2*   CBG: Recent Labs  Lab 12/30/21 1602 12/30/21 2114 12/31/21 0251 12/31/21 0558 12/31/21 1148  GLUCAP 182* 282*  177* 181* 313*    Discharge time spent: greater than 30 minutes.  Signed: Murray Hodgkins, MD [facility] 12/31/2021

## 2021-12-31 NOTE — Progress Notes (Addendum)
Inpatient Diabetes Program Recommendations  AACE/ADA: New Consensus Statement on Inpatient Glycemic Control (2015)  Target Ranges:  Prepandial:   less than 140 mg/dL      Peak postprandial:   less than 180 mg/dL (1-2 hours)      Critically ill patients:  140 - 180 mg/dL   Lab Results  Component Value Date   GLUCAP 181 (H) 12/31/2021   HGBA1C 7.4 (H) 11/23/2021    Review of Glycemic Control  Latest Reference Range & Units 12/30/21 06:54 12/30/21 11:04 12/30/21 16:02 12/30/21 21:14 12/31/21 02:51 12/31/21 05:58  Glucose-Capillary 70 - 99 mg/dL 152 (H) 314 (H) 182 (H) 282 (H) 177 (H) 181 (H)  (H): Data is abnormally high  Diabetes history: DM2 Outpatient Diabetes medications:  Glipizide 7.5 mg with breakfast Januvia 25 mg QD Current orders for Inpatient glycemic control:  Novolog 0-6 units TID Tradjenta 5 mg QD  Inpatient Diabetes Program Recommendations:    For DC, please consider:  Lantus 5 units QHS Discontinue Glipizide   Spoke with son and spouse at bedside.  They confirm above DM medications.  He checks his BG fasting daily.  Fasting BG is 160-180 at home.  His postprandials are significantly elevated.  He eats breakfast and supper.  Does not eat consistently so would not recommend meal coverage.    Asked family to check CBGs 2 hrs postprandially.  Discussed hypoglycemia, signs, symptoms and treatments.  They are interested in a [employer].  Will ask for order from MD.    Lantus Solostar insulin pen Order# [id number] Insulin pen needles order # [id number] Freestyle Libre 2 sensor order # [id number]  [place] on back of left arm.  Educated patient and family on use, applications and how to discard.  Provided them with an extra sensor.  F/U with PCP and call PCP if CGM readings consistently > 200 mg/dL or < 100 mg/dL.   Educated on hypoglycemia, signs, symptoms and treatment.    Will continue to follow while inpatient.  Thank you, Reche Dixon, MSN,  [place] [facility] Coordinator Inpatient Diabetes Program [phone] (team pager from 8a-5p)

## 2022-01-01 ENCOUNTER — Encounter (HOSPITAL_COMMUNITY): Payer: Medicare Other | Admitting: Internal Medicine

## 2022-01-01 DIAGNOSIS — N2581 Secondary hyperparathyroidism of renal origin: Secondary | ICD-10-CM | POA: Diagnosis not present

## 2022-01-01 DIAGNOSIS — E440 Moderate protein-calorie malnutrition: Secondary | ICD-10-CM | POA: Diagnosis not present

## 2022-01-01 DIAGNOSIS — N179 Acute kidney failure, unspecified: Secondary | ICD-10-CM | POA: Diagnosis not present

## 2022-01-01 DIAGNOSIS — D483 Neoplasm of uncertain behavior of retroperitoneum: Secondary | ICD-10-CM | POA: Diagnosis not present

## 2022-01-01 DIAGNOSIS — K219 Gastro-esophageal reflux disease without esophagitis: Secondary | ICD-10-CM | POA: Diagnosis not present

## 2022-01-01 DIAGNOSIS — D509 Iron deficiency anemia, unspecified: Secondary | ICD-10-CM | POA: Diagnosis not present

## 2022-01-01 DIAGNOSIS — Z466 Encounter for fitting and adjustment of urinary device: Secondary | ICD-10-CM | POA: Diagnosis not present

## 2022-01-01 DIAGNOSIS — D630 Anemia in neoplastic disease: Secondary | ICD-10-CM | POA: Diagnosis not present

## 2022-01-01 DIAGNOSIS — Z7984 Long term (current) use of oral hypoglycemic drugs: Secondary | ICD-10-CM | POA: Diagnosis not present

## 2022-01-01 DIAGNOSIS — D631 Anemia in chronic kidney disease: Secondary | ICD-10-CM | POA: Diagnosis not present

## 2022-01-01 DIAGNOSIS — R338 Other retention of urine: Secondary | ICD-10-CM | POA: Diagnosis not present

## 2022-01-01 DIAGNOSIS — I480 Paroxysmal atrial fibrillation: Secondary | ICD-10-CM | POA: Diagnosis not present

## 2022-01-01 DIAGNOSIS — Z794 Long term (current) use of insulin: Secondary | ICD-10-CM | POA: Diagnosis not present

## 2022-01-01 DIAGNOSIS — F32A Depression, unspecified: Secondary | ICD-10-CM | POA: Diagnosis not present

## 2022-01-01 DIAGNOSIS — N186 End stage renal disease: Secondary | ICD-10-CM | POA: Diagnosis not present

## 2022-01-01 DIAGNOSIS — J9611 Chronic respiratory failure with hypoxia: Secondary | ICD-10-CM | POA: Diagnosis not present

## 2022-01-01 DIAGNOSIS — I132 Hypertensive heart and chronic kidney disease with heart failure and with stage 5 chronic kidney disease, or end stage renal disease: Secondary | ICD-10-CM | POA: Diagnosis not present

## 2022-01-01 DIAGNOSIS — I4892 Unspecified atrial flutter: Secondary | ICD-10-CM | POA: Diagnosis not present

## 2022-01-01 DIAGNOSIS — E785 Hyperlipidemia, unspecified: Secondary | ICD-10-CM | POA: Diagnosis not present

## 2022-01-01 DIAGNOSIS — I2510 Atherosclerotic heart disease of native coronary artery without angina pectoris: Secondary | ICD-10-CM | POA: Diagnosis not present

## 2022-01-01 DIAGNOSIS — J9811 Atelectasis: Secondary | ICD-10-CM | POA: Diagnosis not present

## 2022-01-01 DIAGNOSIS — E1122 Type 2 diabetes mellitus with diabetic chronic kidney disease: Secondary | ICD-10-CM | POA: Diagnosis not present

## 2022-01-01 DIAGNOSIS — I5033 Acute on chronic diastolic (congestive) heart failure: Secondary | ICD-10-CM | POA: Diagnosis not present

## 2022-01-01 DIAGNOSIS — Z7901 Long term (current) use of anticoagulants: Secondary | ICD-10-CM | POA: Diagnosis not present

## 2022-01-04 ENCOUNTER — Telehealth: Payer: Self-pay | Admitting: Family Medicine

## 2022-01-04 DIAGNOSIS — F32A Depression, unspecified: Secondary | ICD-10-CM | POA: Diagnosis not present

## 2022-01-04 DIAGNOSIS — Z794 Long term (current) use of insulin: Secondary | ICD-10-CM | POA: Diagnosis not present

## 2022-01-04 DIAGNOSIS — E1122 Type 2 diabetes mellitus with diabetic chronic kidney disease: Secondary | ICD-10-CM | POA: Diagnosis not present

## 2022-01-04 DIAGNOSIS — N186 End stage renal disease: Secondary | ICD-10-CM | POA: Diagnosis not present

## 2022-01-04 DIAGNOSIS — E785 Hyperlipidemia, unspecified: Secondary | ICD-10-CM | POA: Diagnosis not present

## 2022-01-04 DIAGNOSIS — J9611 Chronic respiratory failure with hypoxia: Secondary | ICD-10-CM | POA: Diagnosis not present

## 2022-01-04 DIAGNOSIS — Z466 Encounter for fitting and adjustment of urinary device: Secondary | ICD-10-CM | POA: Diagnosis not present

## 2022-01-04 DIAGNOSIS — E440 Moderate protein-calorie malnutrition: Secondary | ICD-10-CM | POA: Diagnosis not present

## 2022-01-04 DIAGNOSIS — R338 Other retention of urine: Secondary | ICD-10-CM | POA: Diagnosis not present

## 2022-01-04 DIAGNOSIS — K219 Gastro-esophageal reflux disease without esophagitis: Secondary | ICD-10-CM | POA: Diagnosis not present

## 2022-01-04 DIAGNOSIS — D630 Anemia in neoplastic disease: Secondary | ICD-10-CM | POA: Diagnosis not present

## 2022-01-04 DIAGNOSIS — Z7901 Long term (current) use of anticoagulants: Secondary | ICD-10-CM | POA: Diagnosis not present

## 2022-01-04 DIAGNOSIS — N2581 Secondary hyperparathyroidism of renal origin: Secondary | ICD-10-CM | POA: Diagnosis not present

## 2022-01-04 DIAGNOSIS — Z7984 Long term (current) use of oral hypoglycemic drugs: Secondary | ICD-10-CM | POA: Diagnosis not present

## 2022-01-04 DIAGNOSIS — I5033 Acute on chronic diastolic (congestive) heart failure: Secondary | ICD-10-CM | POA: Diagnosis not present

## 2022-01-04 DIAGNOSIS — D509 Iron deficiency anemia, unspecified: Secondary | ICD-10-CM | POA: Diagnosis not present

## 2022-01-04 DIAGNOSIS — N179 Acute kidney failure, unspecified: Secondary | ICD-10-CM | POA: Diagnosis not present

## 2022-01-04 DIAGNOSIS — D483 Neoplasm of uncertain behavior of retroperitoneum: Secondary | ICD-10-CM | POA: Diagnosis not present

## 2022-01-04 DIAGNOSIS — I2510 Atherosclerotic heart disease of native coronary artery without angina pectoris: Secondary | ICD-10-CM | POA: Diagnosis not present

## 2022-01-04 DIAGNOSIS — I4892 Unspecified atrial flutter: Secondary | ICD-10-CM | POA: Diagnosis not present

## 2022-01-04 DIAGNOSIS — I132 Hypertensive heart and chronic kidney disease with heart failure and with stage 5 chronic kidney disease, or end stage renal disease: Secondary | ICD-10-CM | POA: Diagnosis not present

## 2022-01-04 DIAGNOSIS — D631 Anemia in chronic kidney disease: Secondary | ICD-10-CM | POA: Diagnosis not present

## 2022-01-04 DIAGNOSIS — J9811 Atelectasis: Secondary | ICD-10-CM | POA: Diagnosis not present

## 2022-01-04 DIAGNOSIS — I480 Paroxysmal atrial fibrillation: Secondary | ICD-10-CM | POA: Diagnosis not present

## 2022-01-04 NOTE — Telephone Encounter (Signed)
faxed

## 2022-01-05 DIAGNOSIS — J9811 Atelectasis: Secondary | ICD-10-CM | POA: Diagnosis not present

## 2022-01-05 DIAGNOSIS — D483 Neoplasm of uncertain behavior of retroperitoneum: Secondary | ICD-10-CM | POA: Diagnosis not present

## 2022-01-05 DIAGNOSIS — Z794 Long term (current) use of insulin: Secondary | ICD-10-CM | POA: Diagnosis not present

## 2022-01-05 DIAGNOSIS — I132 Hypertensive heart and chronic kidney disease with heart failure and with stage 5 chronic kidney disease, or end stage renal disease: Secondary | ICD-10-CM | POA: Diagnosis not present

## 2022-01-05 DIAGNOSIS — F32A Depression, unspecified: Secondary | ICD-10-CM | POA: Diagnosis not present

## 2022-01-05 DIAGNOSIS — Z7984 Long term (current) use of oral hypoglycemic drugs: Secondary | ICD-10-CM | POA: Diagnosis not present

## 2022-01-05 DIAGNOSIS — N2581 Secondary hyperparathyroidism of renal origin: Secondary | ICD-10-CM | POA: Diagnosis not present

## 2022-01-05 DIAGNOSIS — R338 Other retention of urine: Secondary | ICD-10-CM | POA: Diagnosis not present

## 2022-01-05 DIAGNOSIS — Z7901 Long term (current) use of anticoagulants: Secondary | ICD-10-CM | POA: Diagnosis not present

## 2022-01-05 DIAGNOSIS — K219 Gastro-esophageal reflux disease without esophagitis: Secondary | ICD-10-CM | POA: Diagnosis not present

## 2022-01-05 DIAGNOSIS — N186 End stage renal disease: Secondary | ICD-10-CM | POA: Diagnosis not present

## 2022-01-05 DIAGNOSIS — I2510 Atherosclerotic heart disease of native coronary artery without angina pectoris: Secondary | ICD-10-CM | POA: Diagnosis not present

## 2022-01-05 DIAGNOSIS — E1122 Type 2 diabetes mellitus with diabetic chronic kidney disease: Secondary | ICD-10-CM | POA: Diagnosis not present

## 2022-01-05 DIAGNOSIS — N179 Acute kidney failure, unspecified: Secondary | ICD-10-CM | POA: Diagnosis not present

## 2022-01-05 DIAGNOSIS — E785 Hyperlipidemia, unspecified: Secondary | ICD-10-CM | POA: Diagnosis not present

## 2022-01-05 DIAGNOSIS — J9611 Chronic respiratory failure with hypoxia: Secondary | ICD-10-CM | POA: Diagnosis not present

## 2022-01-05 DIAGNOSIS — I5033 Acute on chronic diastolic (congestive) heart failure: Secondary | ICD-10-CM | POA: Diagnosis not present

## 2022-01-05 DIAGNOSIS — E440 Moderate protein-calorie malnutrition: Secondary | ICD-10-CM | POA: Diagnosis not present

## 2022-01-05 DIAGNOSIS — I480 Paroxysmal atrial fibrillation: Secondary | ICD-10-CM | POA: Diagnosis not present

## 2022-01-05 DIAGNOSIS — D509 Iron deficiency anemia, unspecified: Secondary | ICD-10-CM | POA: Diagnosis not present

## 2022-01-05 DIAGNOSIS — D630 Anemia in neoplastic disease: Secondary | ICD-10-CM | POA: Diagnosis not present

## 2022-01-05 DIAGNOSIS — Z466 Encounter for fitting and adjustment of urinary device: Secondary | ICD-10-CM | POA: Diagnosis not present

## 2022-01-05 DIAGNOSIS — I4892 Unspecified atrial flutter: Secondary | ICD-10-CM | POA: Diagnosis not present

## 2022-01-05 DIAGNOSIS — I5032 Chronic diastolic (congestive) heart failure: Secondary | ICD-10-CM | POA: Diagnosis not present

## 2022-01-05 DIAGNOSIS — D631 Anemia in chronic kidney disease: Secondary | ICD-10-CM | POA: Diagnosis not present

## 2022-01-06 DIAGNOSIS — D447 Neoplasm of uncertain behavior of aortic body and other paraganglia: Secondary | ICD-10-CM | POA: Diagnosis not present

## 2022-01-06 DIAGNOSIS — I5030 Unspecified diastolic (congestive) heart failure: Secondary | ICD-10-CM | POA: Diagnosis not present

## 2022-01-06 DIAGNOSIS — I129 Hypertensive chronic kidney disease with stage 1 through stage 4 chronic kidney disease, or unspecified chronic kidney disease: Secondary | ICD-10-CM | POA: Diagnosis not present

## 2022-01-06 DIAGNOSIS — N184 Chronic kidney disease, stage 4 (severe): Secondary | ICD-10-CM | POA: Diagnosis not present

## 2022-01-06 DIAGNOSIS — I770 Arteriovenous fistula, acquired: Secondary | ICD-10-CM | POA: Diagnosis not present

## 2022-01-06 DIAGNOSIS — Z87898 Personal history of other specified conditions: Secondary | ICD-10-CM | POA: Diagnosis not present

## 2022-01-06 DIAGNOSIS — E1122 Type 2 diabetes mellitus with diabetic chronic kidney disease: Secondary | ICD-10-CM | POA: Diagnosis not present

## 2022-01-06 DIAGNOSIS — D631 Anemia in chronic kidney disease: Secondary | ICD-10-CM | POA: Diagnosis not present

## 2022-01-06 DIAGNOSIS — N2581 Secondary hyperparathyroidism of renal origin: Secondary | ICD-10-CM | POA: Diagnosis not present

## 2022-01-06 DIAGNOSIS — N189 Chronic kidney disease, unspecified: Secondary | ICD-10-CM | POA: Diagnosis not present

## 2022-01-07 DIAGNOSIS — D447 Neoplasm of uncertain behavior of aortic body and other paraganglia: Secondary | ICD-10-CM | POA: Diagnosis not present

## 2022-01-08 ENCOUNTER — Other Ambulatory Visit: Payer: Self-pay | Admitting: Family Medicine

## 2022-01-08 DIAGNOSIS — E1122 Type 2 diabetes mellitus with diabetic chronic kidney disease: Secondary | ICD-10-CM

## 2022-01-08 DIAGNOSIS — R3914 Feeling of incomplete bladder emptying: Secondary | ICD-10-CM | POA: Diagnosis not present

## 2022-01-09 DIAGNOSIS — J9611 Chronic respiratory failure with hypoxia: Secondary | ICD-10-CM | POA: Diagnosis not present

## 2022-01-09 DIAGNOSIS — D483 Neoplasm of uncertain behavior of retroperitoneum: Secondary | ICD-10-CM | POA: Diagnosis not present

## 2022-01-09 DIAGNOSIS — D509 Iron deficiency anemia, unspecified: Secondary | ICD-10-CM | POA: Diagnosis not present

## 2022-01-09 DIAGNOSIS — R338 Other retention of urine: Secondary | ICD-10-CM | POA: Diagnosis not present

## 2022-01-09 DIAGNOSIS — E1122 Type 2 diabetes mellitus with diabetic chronic kidney disease: Secondary | ICD-10-CM | POA: Diagnosis not present

## 2022-01-09 DIAGNOSIS — N2581 Secondary hyperparathyroidism of renal origin: Secondary | ICD-10-CM | POA: Diagnosis not present

## 2022-01-09 DIAGNOSIS — Z7901 Long term (current) use of anticoagulants: Secondary | ICD-10-CM | POA: Diagnosis not present

## 2022-01-09 DIAGNOSIS — I5033 Acute on chronic diastolic (congestive) heart failure: Secondary | ICD-10-CM | POA: Diagnosis not present

## 2022-01-09 DIAGNOSIS — I480 Paroxysmal atrial fibrillation: Secondary | ICD-10-CM | POA: Diagnosis not present

## 2022-01-09 DIAGNOSIS — E440 Moderate protein-calorie malnutrition: Secondary | ICD-10-CM | POA: Diagnosis not present

## 2022-01-09 DIAGNOSIS — J9811 Atelectasis: Secondary | ICD-10-CM | POA: Diagnosis not present

## 2022-01-09 DIAGNOSIS — I4892 Unspecified atrial flutter: Secondary | ICD-10-CM | POA: Diagnosis not present

## 2022-01-09 DIAGNOSIS — Z466 Encounter for fitting and adjustment of urinary device: Secondary | ICD-10-CM | POA: Diagnosis not present

## 2022-01-09 DIAGNOSIS — I132 Hypertensive heart and chronic kidney disease with heart failure and with stage 5 chronic kidney disease, or end stage renal disease: Secondary | ICD-10-CM | POA: Diagnosis not present

## 2022-01-09 DIAGNOSIS — E785 Hyperlipidemia, unspecified: Secondary | ICD-10-CM | POA: Diagnosis not present

## 2022-01-09 DIAGNOSIS — Z7984 Long term (current) use of oral hypoglycemic drugs: Secondary | ICD-10-CM | POA: Diagnosis not present

## 2022-01-09 DIAGNOSIS — D630 Anemia in neoplastic disease: Secondary | ICD-10-CM | POA: Diagnosis not present

## 2022-01-09 DIAGNOSIS — K219 Gastro-esophageal reflux disease without esophagitis: Secondary | ICD-10-CM | POA: Diagnosis not present

## 2022-01-09 DIAGNOSIS — N179 Acute kidney failure, unspecified: Secondary | ICD-10-CM | POA: Diagnosis not present

## 2022-01-09 DIAGNOSIS — F32A Depression, unspecified: Secondary | ICD-10-CM | POA: Diagnosis not present

## 2022-01-09 DIAGNOSIS — Z794 Long term (current) use of insulin: Secondary | ICD-10-CM | POA: Diagnosis not present

## 2022-01-09 DIAGNOSIS — N186 End stage renal disease: Secondary | ICD-10-CM | POA: Diagnosis not present

## 2022-01-09 DIAGNOSIS — I2510 Atherosclerotic heart disease of native coronary artery without angina pectoris: Secondary | ICD-10-CM | POA: Diagnosis not present

## 2022-01-09 DIAGNOSIS — D631 Anemia in chronic kidney disease: Secondary | ICD-10-CM | POA: Diagnosis not present

## 2022-01-11 ENCOUNTER — Other Ambulatory Visit (HOSPITAL_COMMUNITY): Payer: Self-pay | Admitting: *Deleted

## 2022-01-11 NOTE — Progress Notes (Signed)
Pt had order from march of 2023 for 2 doses of feraheme IV.  He received one on 10/23/21 and since then was in the hospital.  We have not seen the patient since.  Called and spoke to [employer] at [facility] who stated to discontinue the second dose and redraw TIBC and Ferretin levels atg his next appt.  They will send new orders for feraheme if needed based on latest lab results.

## 2022-01-12 DIAGNOSIS — D509 Iron deficiency anemia, unspecified: Secondary | ICD-10-CM | POA: Diagnosis not present

## 2022-01-12 DIAGNOSIS — N2581 Secondary hyperparathyroidism of renal origin: Secondary | ICD-10-CM | POA: Diagnosis not present

## 2022-01-12 DIAGNOSIS — N186 End stage renal disease: Secondary | ICD-10-CM | POA: Diagnosis not present

## 2022-01-12 DIAGNOSIS — J9611 Chronic respiratory failure with hypoxia: Secondary | ICD-10-CM | POA: Diagnosis not present

## 2022-01-12 DIAGNOSIS — Z7901 Long term (current) use of anticoagulants: Secondary | ICD-10-CM | POA: Diagnosis not present

## 2022-01-12 DIAGNOSIS — I4892 Unspecified atrial flutter: Secondary | ICD-10-CM | POA: Diagnosis not present

## 2022-01-12 DIAGNOSIS — Z794 Long term (current) use of insulin: Secondary | ICD-10-CM | POA: Diagnosis not present

## 2022-01-12 DIAGNOSIS — D631 Anemia in chronic kidney disease: Secondary | ICD-10-CM | POA: Diagnosis not present

## 2022-01-12 DIAGNOSIS — E1122 Type 2 diabetes mellitus with diabetic chronic kidney disease: Secondary | ICD-10-CM | POA: Diagnosis not present

## 2022-01-12 DIAGNOSIS — I132 Hypertensive heart and chronic kidney disease with heart failure and with stage 5 chronic kidney disease, or end stage renal disease: Secondary | ICD-10-CM | POA: Diagnosis not present

## 2022-01-12 DIAGNOSIS — Z466 Encounter for fitting and adjustment of urinary device: Secondary | ICD-10-CM | POA: Diagnosis not present

## 2022-01-12 DIAGNOSIS — K219 Gastro-esophageal reflux disease without esophagitis: Secondary | ICD-10-CM | POA: Diagnosis not present

## 2022-01-12 DIAGNOSIS — Z7984 Long term (current) use of oral hypoglycemic drugs: Secondary | ICD-10-CM | POA: Diagnosis not present

## 2022-01-12 DIAGNOSIS — I480 Paroxysmal atrial fibrillation: Secondary | ICD-10-CM | POA: Diagnosis not present

## 2022-01-12 DIAGNOSIS — E785 Hyperlipidemia, unspecified: Secondary | ICD-10-CM | POA: Diagnosis not present

## 2022-01-12 DIAGNOSIS — I5033 Acute on chronic diastolic (congestive) heart failure: Secondary | ICD-10-CM | POA: Diagnosis not present

## 2022-01-12 DIAGNOSIS — D483 Neoplasm of uncertain behavior of retroperitoneum: Secondary | ICD-10-CM | POA: Diagnosis not present

## 2022-01-12 DIAGNOSIS — J9811 Atelectasis: Secondary | ICD-10-CM | POA: Diagnosis not present

## 2022-01-12 DIAGNOSIS — E440 Moderate protein-calorie malnutrition: Secondary | ICD-10-CM | POA: Diagnosis not present

## 2022-01-12 DIAGNOSIS — R338 Other retention of urine: Secondary | ICD-10-CM | POA: Diagnosis not present

## 2022-01-12 DIAGNOSIS — D630 Anemia in neoplastic disease: Secondary | ICD-10-CM | POA: Diagnosis not present

## 2022-01-12 DIAGNOSIS — F32A Depression, unspecified: Secondary | ICD-10-CM | POA: Diagnosis not present

## 2022-01-12 DIAGNOSIS — I2510 Atherosclerotic heart disease of native coronary artery without angina pectoris: Secondary | ICD-10-CM | POA: Diagnosis not present

## 2022-01-12 DIAGNOSIS — N179 Acute kidney failure, unspecified: Secondary | ICD-10-CM | POA: Diagnosis not present

## 2022-01-13 ENCOUNTER — Ambulatory Visit (HOSPITAL_COMMUNITY)
Admission: RE | Admit: 2022-01-13 | Discharge: 2022-01-13 | Disposition: A | Discharge status: Home / Self Care | Payer: Medicare Other | Source: Ambulatory Visit | Attending: Nephrology | Admitting: Nephrology

## 2022-01-13 VITALS — BP 115/47 | HR 69 | Temp 98.1°F | Resp 19

## 2022-01-13 DIAGNOSIS — Z466 Encounter for fitting and adjustment of urinary device: Secondary | ICD-10-CM | POA: Diagnosis not present

## 2022-01-13 DIAGNOSIS — E440 Moderate protein-calorie malnutrition: Secondary | ICD-10-CM | POA: Diagnosis not present

## 2022-01-13 DIAGNOSIS — N179 Acute kidney failure, unspecified: Secondary | ICD-10-CM | POA: Diagnosis not present

## 2022-01-13 DIAGNOSIS — I132 Hypertensive heart and chronic kidney disease with heart failure and with stage 5 chronic kidney disease, or end stage renal disease: Secondary | ICD-10-CM | POA: Diagnosis not present

## 2022-01-13 DIAGNOSIS — R338 Other retention of urine: Secondary | ICD-10-CM | POA: Diagnosis not present

## 2022-01-13 DIAGNOSIS — Z794 Long term (current) use of insulin: Secondary | ICD-10-CM | POA: Diagnosis not present

## 2022-01-13 DIAGNOSIS — D509 Iron deficiency anemia, unspecified: Secondary | ICD-10-CM | POA: Diagnosis not present

## 2022-01-13 DIAGNOSIS — F32A Depression, unspecified: Secondary | ICD-10-CM | POA: Diagnosis not present

## 2022-01-13 DIAGNOSIS — D631 Anemia in chronic kidney disease: Secondary | ICD-10-CM | POA: Insufficient documentation

## 2022-01-13 DIAGNOSIS — N186 End stage renal disease: Secondary | ICD-10-CM | POA: Diagnosis not present

## 2022-01-13 DIAGNOSIS — I2510 Atherosclerotic heart disease of native coronary artery without angina pectoris: Secondary | ICD-10-CM | POA: Diagnosis not present

## 2022-01-13 DIAGNOSIS — I480 Paroxysmal atrial fibrillation: Secondary | ICD-10-CM | POA: Diagnosis not present

## 2022-01-13 DIAGNOSIS — E1122 Type 2 diabetes mellitus with diabetic chronic kidney disease: Secondary | ICD-10-CM | POA: Diagnosis not present

## 2022-01-13 DIAGNOSIS — K219 Gastro-esophageal reflux disease without esophagitis: Secondary | ICD-10-CM | POA: Diagnosis not present

## 2022-01-13 DIAGNOSIS — D483 Neoplasm of uncertain behavior of retroperitoneum: Secondary | ICD-10-CM | POA: Diagnosis not present

## 2022-01-13 DIAGNOSIS — I4892 Unspecified atrial flutter: Secondary | ICD-10-CM | POA: Diagnosis not present

## 2022-01-13 DIAGNOSIS — N184 Chronic kidney disease, stage 4 (severe): Secondary | ICD-10-CM | POA: Diagnosis not present

## 2022-01-13 DIAGNOSIS — I5033 Acute on chronic diastolic (congestive) heart failure: Secondary | ICD-10-CM | POA: Diagnosis not present

## 2022-01-13 DIAGNOSIS — Z7901 Long term (current) use of anticoagulants: Secondary | ICD-10-CM | POA: Diagnosis not present

## 2022-01-13 DIAGNOSIS — E785 Hyperlipidemia, unspecified: Secondary | ICD-10-CM | POA: Diagnosis not present

## 2022-01-13 DIAGNOSIS — J9811 Atelectasis: Secondary | ICD-10-CM | POA: Diagnosis not present

## 2022-01-13 DIAGNOSIS — N2581 Secondary hyperparathyroidism of renal origin: Secondary | ICD-10-CM | POA: Diagnosis not present

## 2022-01-13 DIAGNOSIS — J9611 Chronic respiratory failure with hypoxia: Secondary | ICD-10-CM | POA: Diagnosis not present

## 2022-01-13 DIAGNOSIS — D630 Anemia in neoplastic disease: Secondary | ICD-10-CM | POA: Diagnosis not present

## 2022-01-13 DIAGNOSIS — Z7984 Long term (current) use of oral hypoglycemic drugs: Secondary | ICD-10-CM | POA: Diagnosis not present

## 2022-01-13 LAB — RENAL FUNCTION PANEL
Albumin: 3.4 g/dL — ABNORMAL LOW (ref 3.5–5.0)
Anion gap: 23 — ABNORMAL HIGH (ref 5–15)
BUN: 136 mg/dL — ABNORMAL HIGH (ref 8–23)
CO2: 33 mmol/L — ABNORMAL HIGH (ref 22–32)
Calcium: 8.8 mg/dL — ABNORMAL LOW (ref 8.9–10.3)
Chloride: 78 mmol/L — ABNORMAL LOW (ref 98–111)
Creatinine, Ser: 5.21 mg/dL — ABNORMAL HIGH (ref 0.61–1.24)
GFR, Estimated: 11 mL/min — ABNORMAL LOW (ref >60)
Glucose, Bld: 325 mg/dL — ABNORMAL HIGH (ref 70–99)
Phosphorus: 6.6 mg/dL — ABNORMAL HIGH (ref 2.5–4.6)
Potassium: 3.9 mmol/L (ref 3.5–5.1)
Sodium: 134 mmol/L — ABNORMAL LOW (ref 135–145)

## 2022-01-13 LAB — IRON AND TIBC
Iron: 66 ug/dL (ref 45–182)
Saturation Ratios: 27 % (ref 17.9–39.5)
TIBC: 244 ug/dL — ABNORMAL LOW (ref 250–450)
UIBC: 178 ug/dL

## 2022-01-13 LAB — FERRITIN: Ferritin: 1975 ng/mL — ABNORMAL HIGH (ref 24–336)

## 2022-01-13 LAB — POCT HEMOGLOBIN-HEMACUE: Hemoglobin: 7.5 g/dL — ABNORMAL LOW (ref 13.0–17.0)

## 2022-01-13 LAB — MAGNESIUM: Magnesium: 3.7 mg/dL — ABNORMAL HIGH (ref 1.7–2.4)

## 2022-01-13 MED ORDER — EPOETIN ALFA-EPBX 10000 UNIT/ML IJ SOLN
INTRAMUSCULAR | Status: AC
  Administered 2022-01-13: 10000 [IU] via SUBCUTANEOUS
  Filled 2022-01-13: qty 1

## 2022-01-13 MED ORDER — EPOETIN ALFA-EPBX 10000 UNIT/ML IJ SOLN: 10000.0000 [IU] | INTRAMUSCULAR | Status: DC

## 2022-01-13 NOTE — Telephone Encounter (Signed)
Attempted to reach out to patient on multiple attempts to schedule fistulogram as requested, but patient was either not available or hospitalized from June 4-June 22.   Today, left message for patient to return call via language line interpreter, Nasim [phone].

## 2022-01-13 NOTE — Progress Notes (Signed)
Hemocue 7.5 today. Doristine Bosworth stated proceed with current orders for retacrit

## 2022-01-14 ENCOUNTER — Other Ambulatory Visit: Payer: Self-pay

## 2022-01-14 DIAGNOSIS — N184 Chronic kidney disease, stage 4 (severe): Secondary | ICD-10-CM

## 2022-01-14 NOTE — Telephone Encounter (Signed)
Patient's wife returned call to schedule fistulogram. Offered multiple dates with Dr. Carlis Abbott, but patient was either unavailable or appointment too early. Wife requested any provider with a later time. Scheduled procedure on July 21 with [name]. Instructions reviewed- wife verbalized understanding.

## 2022-01-15 DIAGNOSIS — J9811 Atelectasis: Secondary | ICD-10-CM | POA: Diagnosis not present

## 2022-01-15 DIAGNOSIS — I480 Paroxysmal atrial fibrillation: Secondary | ICD-10-CM | POA: Diagnosis not present

## 2022-01-15 DIAGNOSIS — I5033 Acute on chronic diastolic (congestive) heart failure: Secondary | ICD-10-CM | POA: Diagnosis not present

## 2022-01-15 DIAGNOSIS — D630 Anemia in neoplastic disease: Secondary | ICD-10-CM | POA: Diagnosis not present

## 2022-01-15 DIAGNOSIS — Z466 Encounter for fitting and adjustment of urinary device: Secondary | ICD-10-CM | POA: Diagnosis not present

## 2022-01-15 DIAGNOSIS — N2581 Secondary hyperparathyroidism of renal origin: Secondary | ICD-10-CM | POA: Diagnosis not present

## 2022-01-15 DIAGNOSIS — K219 Gastro-esophageal reflux disease without esophagitis: Secondary | ICD-10-CM | POA: Diagnosis not present

## 2022-01-15 DIAGNOSIS — D509 Iron deficiency anemia, unspecified: Secondary | ICD-10-CM | POA: Diagnosis not present

## 2022-01-15 DIAGNOSIS — E1122 Type 2 diabetes mellitus with diabetic chronic kidney disease: Secondary | ICD-10-CM | POA: Diagnosis not present

## 2022-01-15 DIAGNOSIS — I4892 Unspecified atrial flutter: Secondary | ICD-10-CM | POA: Diagnosis not present

## 2022-01-15 DIAGNOSIS — I2510 Atherosclerotic heart disease of native coronary artery without angina pectoris: Secondary | ICD-10-CM | POA: Diagnosis not present

## 2022-01-15 DIAGNOSIS — D631 Anemia in chronic kidney disease: Secondary | ICD-10-CM | POA: Diagnosis not present

## 2022-01-15 DIAGNOSIS — Z7984 Long term (current) use of oral hypoglycemic drugs: Secondary | ICD-10-CM | POA: Diagnosis not present

## 2022-01-15 DIAGNOSIS — F32A Depression, unspecified: Secondary | ICD-10-CM | POA: Diagnosis not present

## 2022-01-15 DIAGNOSIS — N179 Acute kidney failure, unspecified: Secondary | ICD-10-CM | POA: Diagnosis not present

## 2022-01-15 DIAGNOSIS — N186 End stage renal disease: Secondary | ICD-10-CM | POA: Diagnosis not present

## 2022-01-15 DIAGNOSIS — D447 Neoplasm of uncertain behavior of aortic body and other paraganglia: Secondary | ICD-10-CM | POA: Diagnosis not present

## 2022-01-15 DIAGNOSIS — Z7901 Long term (current) use of anticoagulants: Secondary | ICD-10-CM | POA: Diagnosis not present

## 2022-01-15 DIAGNOSIS — R338 Other retention of urine: Secondary | ICD-10-CM | POA: Diagnosis not present

## 2022-01-15 DIAGNOSIS — E440 Moderate protein-calorie malnutrition: Secondary | ICD-10-CM | POA: Diagnosis not present

## 2022-01-15 DIAGNOSIS — Z794 Long term (current) use of insulin: Secondary | ICD-10-CM | POA: Diagnosis not present

## 2022-01-15 DIAGNOSIS — E785 Hyperlipidemia, unspecified: Secondary | ICD-10-CM | POA: Diagnosis not present

## 2022-01-15 DIAGNOSIS — D483 Neoplasm of uncertain behavior of retroperitoneum: Secondary | ICD-10-CM | POA: Diagnosis not present

## 2022-01-15 DIAGNOSIS — I132 Hypertensive heart and chronic kidney disease with heart failure and with stage 5 chronic kidney disease, or end stage renal disease: Secondary | ICD-10-CM | POA: Diagnosis not present

## 2022-01-15 DIAGNOSIS — J9611 Chronic respiratory failure with hypoxia: Secondary | ICD-10-CM | POA: Diagnosis not present

## 2022-01-16 ENCOUNTER — Encounter: Payer: Self-pay | Admitting: Family Medicine

## 2022-01-16 ENCOUNTER — Other Ambulatory Visit: Payer: Self-pay | Admitting: Family Medicine

## 2022-01-16 DIAGNOSIS — E1122 Type 2 diabetes mellitus with diabetic chronic kidney disease: Secondary | ICD-10-CM

## 2022-01-18 DIAGNOSIS — E1122 Type 2 diabetes mellitus with diabetic chronic kidney disease: Secondary | ICD-10-CM | POA: Diagnosis not present

## 2022-01-18 DIAGNOSIS — Z794 Long term (current) use of insulin: Secondary | ICD-10-CM | POA: Diagnosis not present

## 2022-01-18 DIAGNOSIS — F32A Depression, unspecified: Secondary | ICD-10-CM | POA: Diagnosis not present

## 2022-01-18 DIAGNOSIS — I2510 Atherosclerotic heart disease of native coronary artery without angina pectoris: Secondary | ICD-10-CM | POA: Diagnosis not present

## 2022-01-18 DIAGNOSIS — I5033 Acute on chronic diastolic (congestive) heart failure: Secondary | ICD-10-CM | POA: Diagnosis not present

## 2022-01-18 DIAGNOSIS — I132 Hypertensive heart and chronic kidney disease with heart failure and with stage 5 chronic kidney disease, or end stage renal disease: Secondary | ICD-10-CM | POA: Diagnosis not present

## 2022-01-18 DIAGNOSIS — D483 Neoplasm of uncertain behavior of retroperitoneum: Secondary | ICD-10-CM | POA: Diagnosis not present

## 2022-01-18 DIAGNOSIS — N2581 Secondary hyperparathyroidism of renal origin: Secondary | ICD-10-CM | POA: Diagnosis not present

## 2022-01-18 DIAGNOSIS — J9811 Atelectasis: Secondary | ICD-10-CM | POA: Diagnosis not present

## 2022-01-18 DIAGNOSIS — E785 Hyperlipidemia, unspecified: Secondary | ICD-10-CM | POA: Diagnosis not present

## 2022-01-18 DIAGNOSIS — N186 End stage renal disease: Secondary | ICD-10-CM | POA: Diagnosis not present

## 2022-01-18 DIAGNOSIS — Z466 Encounter for fitting and adjustment of urinary device: Secondary | ICD-10-CM | POA: Diagnosis not present

## 2022-01-18 DIAGNOSIS — K219 Gastro-esophageal reflux disease without esophagitis: Secondary | ICD-10-CM | POA: Diagnosis not present

## 2022-01-18 DIAGNOSIS — D631 Anemia in chronic kidney disease: Secondary | ICD-10-CM | POA: Diagnosis not present

## 2022-01-18 DIAGNOSIS — Z7984 Long term (current) use of oral hypoglycemic drugs: Secondary | ICD-10-CM | POA: Diagnosis not present

## 2022-01-18 DIAGNOSIS — R338 Other retention of urine: Secondary | ICD-10-CM | POA: Diagnosis not present

## 2022-01-18 DIAGNOSIS — J9611 Chronic respiratory failure with hypoxia: Secondary | ICD-10-CM | POA: Diagnosis not present

## 2022-01-18 DIAGNOSIS — N179 Acute kidney failure, unspecified: Secondary | ICD-10-CM | POA: Diagnosis not present

## 2022-01-18 DIAGNOSIS — D630 Anemia in neoplastic disease: Secondary | ICD-10-CM | POA: Diagnosis not present

## 2022-01-18 DIAGNOSIS — I4892 Unspecified atrial flutter: Secondary | ICD-10-CM | POA: Diagnosis not present

## 2022-01-18 DIAGNOSIS — D509 Iron deficiency anemia, unspecified: Secondary | ICD-10-CM | POA: Diagnosis not present

## 2022-01-18 DIAGNOSIS — Z7901 Long term (current) use of anticoagulants: Secondary | ICD-10-CM | POA: Diagnosis not present

## 2022-01-18 DIAGNOSIS — E440 Moderate protein-calorie malnutrition: Secondary | ICD-10-CM | POA: Diagnosis not present

## 2022-01-18 DIAGNOSIS — I480 Paroxysmal atrial fibrillation: Secondary | ICD-10-CM | POA: Diagnosis not present

## 2022-01-18 MED ORDER — BLOOD GLUCOSE METER KIT: PACK | 0 refills | Status: AC

## 2022-01-18 MED ORDER — BLOOD GLUCOSE METER KIT: PACK | 0 refills | Status: DC

## 2022-01-18 NOTE — Telephone Encounter (Signed)
Pt in need of new monitor

## 2022-01-18 NOTE — Telephone Encounter (Signed)
Appears this has been ordered today.

## 2022-01-19 DIAGNOSIS — N179 Acute kidney failure, unspecified: Secondary | ICD-10-CM | POA: Diagnosis not present

## 2022-01-19 DIAGNOSIS — I2510 Atherosclerotic heart disease of native coronary artery without angina pectoris: Secondary | ICD-10-CM | POA: Diagnosis not present

## 2022-01-19 DIAGNOSIS — E785 Hyperlipidemia, unspecified: Secondary | ICD-10-CM | POA: Diagnosis not present

## 2022-01-19 DIAGNOSIS — Z794 Long term (current) use of insulin: Secondary | ICD-10-CM | POA: Diagnosis not present

## 2022-01-19 DIAGNOSIS — F32A Depression, unspecified: Secondary | ICD-10-CM | POA: Diagnosis not present

## 2022-01-19 DIAGNOSIS — D509 Iron deficiency anemia, unspecified: Secondary | ICD-10-CM | POA: Diagnosis not present

## 2022-01-19 DIAGNOSIS — Z7901 Long term (current) use of anticoagulants: Secondary | ICD-10-CM | POA: Diagnosis not present

## 2022-01-19 DIAGNOSIS — E440 Moderate protein-calorie malnutrition: Secondary | ICD-10-CM | POA: Diagnosis not present

## 2022-01-19 DIAGNOSIS — Z466 Encounter for fitting and adjustment of urinary device: Secondary | ICD-10-CM | POA: Diagnosis not present

## 2022-01-19 DIAGNOSIS — J9811 Atelectasis: Secondary | ICD-10-CM | POA: Diagnosis not present

## 2022-01-19 DIAGNOSIS — Z7984 Long term (current) use of oral hypoglycemic drugs: Secondary | ICD-10-CM | POA: Diagnosis not present

## 2022-01-19 DIAGNOSIS — I132 Hypertensive heart and chronic kidney disease with heart failure and with stage 5 chronic kidney disease, or end stage renal disease: Secondary | ICD-10-CM | POA: Diagnosis not present

## 2022-01-19 DIAGNOSIS — I4892 Unspecified atrial flutter: Secondary | ICD-10-CM | POA: Diagnosis not present

## 2022-01-19 DIAGNOSIS — D483 Neoplasm of uncertain behavior of retroperitoneum: Secondary | ICD-10-CM | POA: Diagnosis not present

## 2022-01-19 DIAGNOSIS — I480 Paroxysmal atrial fibrillation: Secondary | ICD-10-CM | POA: Diagnosis not present

## 2022-01-19 DIAGNOSIS — N2581 Secondary hyperparathyroidism of renal origin: Secondary | ICD-10-CM | POA: Diagnosis not present

## 2022-01-19 DIAGNOSIS — R338 Other retention of urine: Secondary | ICD-10-CM | POA: Diagnosis not present

## 2022-01-19 DIAGNOSIS — E559 Vitamin D deficiency, unspecified: Secondary | ICD-10-CM | POA: Diagnosis not present

## 2022-01-19 DIAGNOSIS — J9611 Chronic respiratory failure with hypoxia: Secondary | ICD-10-CM | POA: Diagnosis not present

## 2022-01-19 DIAGNOSIS — D631 Anemia in chronic kidney disease: Secondary | ICD-10-CM | POA: Diagnosis not present

## 2022-01-19 DIAGNOSIS — K219 Gastro-esophageal reflux disease without esophagitis: Secondary | ICD-10-CM | POA: Diagnosis not present

## 2022-01-19 DIAGNOSIS — D630 Anemia in neoplastic disease: Secondary | ICD-10-CM | POA: Diagnosis not present

## 2022-01-19 DIAGNOSIS — E1122 Type 2 diabetes mellitus with diabetic chronic kidney disease: Secondary | ICD-10-CM | POA: Diagnosis not present

## 2022-01-19 DIAGNOSIS — I5033 Acute on chronic diastolic (congestive) heart failure: Secondary | ICD-10-CM | POA: Diagnosis not present

## 2022-01-19 DIAGNOSIS — N186 End stage renal disease: Secondary | ICD-10-CM | POA: Diagnosis not present

## 2022-01-21 DIAGNOSIS — K668 Other specified disorders of peritoneum: Secondary | ICD-10-CM | POA: Diagnosis not present

## 2022-01-21 DIAGNOSIS — D447 Neoplasm of uncertain behavior of aortic body and other paraganglia: Secondary | ICD-10-CM | POA: Diagnosis not present

## 2022-01-22 DIAGNOSIS — I480 Paroxysmal atrial fibrillation: Secondary | ICD-10-CM | POA: Diagnosis not present

## 2022-01-22 DIAGNOSIS — I5033 Acute on chronic diastolic (congestive) heart failure: Secondary | ICD-10-CM | POA: Diagnosis not present

## 2022-01-22 DIAGNOSIS — D509 Iron deficiency anemia, unspecified: Secondary | ICD-10-CM | POA: Diagnosis not present

## 2022-01-22 DIAGNOSIS — R338 Other retention of urine: Secondary | ICD-10-CM | POA: Diagnosis not present

## 2022-01-22 DIAGNOSIS — E1122 Type 2 diabetes mellitus with diabetic chronic kidney disease: Secondary | ICD-10-CM | POA: Diagnosis not present

## 2022-01-22 DIAGNOSIS — N179 Acute kidney failure, unspecified: Secondary | ICD-10-CM | POA: Diagnosis not present

## 2022-01-22 DIAGNOSIS — N186 End stage renal disease: Secondary | ICD-10-CM | POA: Diagnosis not present

## 2022-01-22 DIAGNOSIS — K219 Gastro-esophageal reflux disease without esophagitis: Secondary | ICD-10-CM | POA: Diagnosis not present

## 2022-01-22 DIAGNOSIS — Z7984 Long term (current) use of oral hypoglycemic drugs: Secondary | ICD-10-CM | POA: Diagnosis not present

## 2022-01-22 DIAGNOSIS — Z466 Encounter for fitting and adjustment of urinary device: Secondary | ICD-10-CM | POA: Diagnosis not present

## 2022-01-22 DIAGNOSIS — I132 Hypertensive heart and chronic kidney disease with heart failure and with stage 5 chronic kidney disease, or end stage renal disease: Secondary | ICD-10-CM | POA: Diagnosis not present

## 2022-01-22 DIAGNOSIS — J9811 Atelectasis: Secondary | ICD-10-CM | POA: Diagnosis not present

## 2022-01-22 DIAGNOSIS — Z7901 Long term (current) use of anticoagulants: Secondary | ICD-10-CM | POA: Diagnosis not present

## 2022-01-22 DIAGNOSIS — J9611 Chronic respiratory failure with hypoxia: Secondary | ICD-10-CM | POA: Diagnosis not present

## 2022-01-22 DIAGNOSIS — N2581 Secondary hyperparathyroidism of renal origin: Secondary | ICD-10-CM | POA: Diagnosis not present

## 2022-01-22 DIAGNOSIS — F32A Depression, unspecified: Secondary | ICD-10-CM | POA: Diagnosis not present

## 2022-01-22 DIAGNOSIS — E785 Hyperlipidemia, unspecified: Secondary | ICD-10-CM | POA: Diagnosis not present

## 2022-01-22 DIAGNOSIS — D483 Neoplasm of uncertain behavior of retroperitoneum: Secondary | ICD-10-CM | POA: Diagnosis not present

## 2022-01-22 DIAGNOSIS — Z794 Long term (current) use of insulin: Secondary | ICD-10-CM | POA: Diagnosis not present

## 2022-01-22 DIAGNOSIS — E440 Moderate protein-calorie malnutrition: Secondary | ICD-10-CM | POA: Diagnosis not present

## 2022-01-22 DIAGNOSIS — D631 Anemia in chronic kidney disease: Secondary | ICD-10-CM | POA: Diagnosis not present

## 2022-01-22 DIAGNOSIS — I4892 Unspecified atrial flutter: Secondary | ICD-10-CM | POA: Diagnosis not present

## 2022-01-22 DIAGNOSIS — D630 Anemia in neoplastic disease: Secondary | ICD-10-CM | POA: Diagnosis not present

## 2022-01-22 DIAGNOSIS — I2510 Atherosclerotic heart disease of native coronary artery without angina pectoris: Secondary | ICD-10-CM | POA: Diagnosis not present

## 2022-01-25 DIAGNOSIS — I120 Hypertensive chronic kidney disease with stage 5 chronic kidney disease or end stage renal disease: Secondary | ICD-10-CM | POA: Diagnosis not present

## 2022-01-25 DIAGNOSIS — I5033 Acute on chronic diastolic (congestive) heart failure: Secondary | ICD-10-CM | POA: Diagnosis not present

## 2022-01-25 DIAGNOSIS — K219 Gastro-esophageal reflux disease without esophagitis: Secondary | ICD-10-CM | POA: Diagnosis not present

## 2022-01-25 DIAGNOSIS — N186 End stage renal disease: Secondary | ICD-10-CM | POA: Diagnosis not present

## 2022-01-25 DIAGNOSIS — D509 Iron deficiency anemia, unspecified: Secondary | ICD-10-CM | POA: Diagnosis not present

## 2022-01-25 DIAGNOSIS — N2581 Secondary hyperparathyroidism of renal origin: Secondary | ICD-10-CM | POA: Diagnosis not present

## 2022-01-25 DIAGNOSIS — D631 Anemia in chronic kidney disease: Secondary | ICD-10-CM | POA: Diagnosis not present

## 2022-01-25 DIAGNOSIS — N179 Acute kidney failure, unspecified: Secondary | ICD-10-CM | POA: Diagnosis not present

## 2022-01-25 DIAGNOSIS — I132 Hypertensive heart and chronic kidney disease with heart failure and with stage 5 chronic kidney disease, or end stage renal disease: Secondary | ICD-10-CM | POA: Diagnosis not present

## 2022-01-25 DIAGNOSIS — F32A Depression, unspecified: Secondary | ICD-10-CM | POA: Diagnosis not present

## 2022-01-25 DIAGNOSIS — Z794 Long term (current) use of insulin: Secondary | ICD-10-CM | POA: Diagnosis not present

## 2022-01-25 DIAGNOSIS — D630 Anemia in neoplastic disease: Secondary | ICD-10-CM | POA: Diagnosis not present

## 2022-01-25 DIAGNOSIS — E440 Moderate protein-calorie malnutrition: Secondary | ICD-10-CM | POA: Diagnosis not present

## 2022-01-25 DIAGNOSIS — Z466 Encounter for fitting and adjustment of urinary device: Secondary | ICD-10-CM | POA: Diagnosis not present

## 2022-01-25 DIAGNOSIS — N185 Chronic kidney disease, stage 5: Secondary | ICD-10-CM | POA: Diagnosis not present

## 2022-01-25 DIAGNOSIS — I4892 Unspecified atrial flutter: Secondary | ICD-10-CM | POA: Diagnosis not present

## 2022-01-25 DIAGNOSIS — E785 Hyperlipidemia, unspecified: Secondary | ICD-10-CM | POA: Diagnosis not present

## 2022-01-25 DIAGNOSIS — Z7984 Long term (current) use of oral hypoglycemic drugs: Secondary | ICD-10-CM | POA: Diagnosis not present

## 2022-01-25 DIAGNOSIS — Z7901 Long term (current) use of anticoagulants: Secondary | ICD-10-CM | POA: Diagnosis not present

## 2022-01-25 DIAGNOSIS — D483 Neoplasm of uncertain behavior of retroperitoneum: Secondary | ICD-10-CM | POA: Diagnosis not present

## 2022-01-25 DIAGNOSIS — J9811 Atelectasis: Secondary | ICD-10-CM | POA: Diagnosis not present

## 2022-01-25 DIAGNOSIS — E1122 Type 2 diabetes mellitus with diabetic chronic kidney disease: Secondary | ICD-10-CM | POA: Diagnosis not present

## 2022-01-25 DIAGNOSIS — I480 Paroxysmal atrial fibrillation: Secondary | ICD-10-CM | POA: Diagnosis not present

## 2022-01-25 DIAGNOSIS — R338 Other retention of urine: Secondary | ICD-10-CM | POA: Diagnosis not present

## 2022-01-25 DIAGNOSIS — I2510 Atherosclerotic heart disease of native coronary artery without angina pectoris: Secondary | ICD-10-CM | POA: Diagnosis not present

## 2022-01-25 DIAGNOSIS — J9611 Chronic respiratory failure with hypoxia: Secondary | ICD-10-CM | POA: Diagnosis not present

## 2022-01-26 ENCOUNTER — Other Ambulatory Visit: Payer: Self-pay

## 2022-01-26 ENCOUNTER — Telehealth: Payer: Self-pay | Admitting: Family Medicine

## 2022-01-26 DIAGNOSIS — E440 Moderate protein-calorie malnutrition: Secondary | ICD-10-CM | POA: Diagnosis not present

## 2022-01-26 DIAGNOSIS — D509 Iron deficiency anemia, unspecified: Secondary | ICD-10-CM | POA: Diagnosis not present

## 2022-01-26 DIAGNOSIS — N186 End stage renal disease: Secondary | ICD-10-CM | POA: Diagnosis not present

## 2022-01-26 DIAGNOSIS — Z794 Long term (current) use of insulin: Secondary | ICD-10-CM | POA: Diagnosis not present

## 2022-01-26 DIAGNOSIS — I480 Paroxysmal atrial fibrillation: Secondary | ICD-10-CM | POA: Diagnosis not present

## 2022-01-26 DIAGNOSIS — D630 Anemia in neoplastic disease: Secondary | ICD-10-CM | POA: Diagnosis not present

## 2022-01-26 DIAGNOSIS — I5033 Acute on chronic diastolic (congestive) heart failure: Secondary | ICD-10-CM | POA: Diagnosis not present

## 2022-01-26 DIAGNOSIS — I2510 Atherosclerotic heart disease of native coronary artery without angina pectoris: Secondary | ICD-10-CM | POA: Diagnosis not present

## 2022-01-26 DIAGNOSIS — D483 Neoplasm of uncertain behavior of retroperitoneum: Secondary | ICD-10-CM | POA: Diagnosis not present

## 2022-01-26 DIAGNOSIS — D631 Anemia in chronic kidney disease: Secondary | ICD-10-CM | POA: Diagnosis not present

## 2022-01-26 DIAGNOSIS — E1122 Type 2 diabetes mellitus with diabetic chronic kidney disease: Secondary | ICD-10-CM | POA: Diagnosis not present

## 2022-01-26 DIAGNOSIS — E785 Hyperlipidemia, unspecified: Secondary | ICD-10-CM | POA: Diagnosis not present

## 2022-01-26 DIAGNOSIS — I4892 Unspecified atrial flutter: Secondary | ICD-10-CM | POA: Diagnosis not present

## 2022-01-26 DIAGNOSIS — N179 Acute kidney failure, unspecified: Secondary | ICD-10-CM | POA: Diagnosis not present

## 2022-01-26 DIAGNOSIS — J9811 Atelectasis: Secondary | ICD-10-CM | POA: Diagnosis not present

## 2022-01-26 DIAGNOSIS — Z466 Encounter for fitting and adjustment of urinary device: Secondary | ICD-10-CM | POA: Diagnosis not present

## 2022-01-26 DIAGNOSIS — N2581 Secondary hyperparathyroidism of renal origin: Secondary | ICD-10-CM | POA: Diagnosis not present

## 2022-01-26 DIAGNOSIS — I132 Hypertensive heart and chronic kidney disease with heart failure and with stage 5 chronic kidney disease, or end stage renal disease: Secondary | ICD-10-CM | POA: Diagnosis not present

## 2022-01-26 DIAGNOSIS — J9611 Chronic respiratory failure with hypoxia: Secondary | ICD-10-CM | POA: Diagnosis not present

## 2022-01-26 DIAGNOSIS — Z7984 Long term (current) use of oral hypoglycemic drugs: Secondary | ICD-10-CM | POA: Diagnosis not present

## 2022-01-26 DIAGNOSIS — K219 Gastro-esophageal reflux disease without esophagitis: Secondary | ICD-10-CM | POA: Diagnosis not present

## 2022-01-26 DIAGNOSIS — Z7901 Long term (current) use of anticoagulants: Secondary | ICD-10-CM | POA: Diagnosis not present

## 2022-01-26 DIAGNOSIS — F32A Depression, unspecified: Secondary | ICD-10-CM | POA: Diagnosis not present

## 2022-01-26 DIAGNOSIS — R338 Other retention of urine: Secondary | ICD-10-CM | POA: Diagnosis not present

## 2022-01-26 MED ORDER — INSULIN GLARGINE 100 UNIT/ML SOLOSTAR PEN: 5.0000 [IU] | PEN_INJECTOR | Freq: Every day | SUBCUTANEOUS | 1 refills | Status: AC

## 2022-01-26 NOTE — Telephone Encounter (Signed)
Encourage patient to contact the pharmacy for refills or they can request refills through [facility]  (Please schedule appointment if patient has not been seen in over a year)    WHAT [place] THIS SENT TO: Cyril Mourning [phone]  MEDICATION NAME & DOSE: insulin glargine 100 unit/ml solstar pen  NOTES/COMMENTS FROM PATIENT: Wife states that pt has been using 10 unit instead of 5 units      Front office please notify patient: It takes 48-72 hours to process rx refill requests Ask patient to call pharmacy to ensure rx is ready before heading there.

## 2022-01-27 ENCOUNTER — Encounter (HOSPITAL_COMMUNITY): Payer: Medicare Other

## 2022-01-27 DIAGNOSIS — F17210 Nicotine dependence, cigarettes, uncomplicated: Secondary | ICD-10-CM | POA: Diagnosis not present

## 2022-01-27 DIAGNOSIS — D447 Neoplasm of uncertain behavior of aortic body and other paraganglia: Secondary | ICD-10-CM | POA: Diagnosis not present

## 2022-01-27 DIAGNOSIS — K5900 Constipation, unspecified: Secondary | ICD-10-CM | POA: Diagnosis not present

## 2022-01-27 DIAGNOSIS — Z79899 Other long term (current) drug therapy: Secondary | ICD-10-CM | POA: Diagnosis not present

## 2022-01-27 DIAGNOSIS — I10 Essential (primary) hypertension: Secondary | ICD-10-CM | POA: Diagnosis not present

## 2022-01-28 ENCOUNTER — Other Ambulatory Visit: Payer: Self-pay

## 2022-01-28 ENCOUNTER — Encounter (HOSPITAL_BASED_OUTPATIENT_CLINIC_OR_DEPARTMENT_OTHER): Payer: Self-pay | Admitting: Emergency Medicine

## 2022-01-28 ENCOUNTER — Emergency Department (HOSPITAL_BASED_OUTPATIENT_CLINIC_OR_DEPARTMENT_OTHER): Payer: Medicare Other

## 2022-01-28 ENCOUNTER — Emergency Department (HOSPITAL_BASED_OUTPATIENT_CLINIC_OR_DEPARTMENT_OTHER)
Admission: EM | Admit: 2022-01-28 | Discharge: 2022-01-28 | Disposition: A | Discharge status: Home / Self Care | Payer: Medicare Other | Attending: Emergency Medicine | Admitting: Emergency Medicine

## 2022-01-28 DIAGNOSIS — K5900 Constipation, unspecified: Secondary | ICD-10-CM | POA: Insufficient documentation

## 2022-01-28 DIAGNOSIS — Z79899 Other long term (current) drug therapy: Secondary | ICD-10-CM | POA: Insufficient documentation

## 2022-01-28 DIAGNOSIS — I130 Hypertensive heart and chronic kidney disease with heart failure and stage 1 through stage 4 chronic kidney disease, or unspecified chronic kidney disease: Secondary | ICD-10-CM | POA: Insufficient documentation

## 2022-01-28 DIAGNOSIS — K6389 Other specified diseases of intestine: Secondary | ICD-10-CM | POA: Diagnosis not present

## 2022-01-28 DIAGNOSIS — Z7901 Long term (current) use of anticoagulants: Secondary | ICD-10-CM | POA: Diagnosis not present

## 2022-01-28 DIAGNOSIS — N189 Chronic kidney disease, unspecified: Secondary | ICD-10-CM | POA: Insufficient documentation

## 2022-01-28 DIAGNOSIS — Z794 Long term (current) use of insulin: Secondary | ICD-10-CM | POA: Insufficient documentation

## 2022-01-28 DIAGNOSIS — E119 Type 2 diabetes mellitus without complications: Secondary | ICD-10-CM | POA: Insufficient documentation

## 2022-01-28 DIAGNOSIS — I509 Heart failure, unspecified: Secondary | ICD-10-CM | POA: Diagnosis not present

## 2022-01-28 DIAGNOSIS — N281 Cyst of kidney, acquired: Secondary | ICD-10-CM | POA: Diagnosis not present

## 2022-01-28 DIAGNOSIS — Z7984 Long term (current) use of oral hypoglycemic drugs: Secondary | ICD-10-CM | POA: Insufficient documentation

## 2022-01-28 LAB — CBC WITH DIFFERENTIAL/PLATELET
Abs Immature Granulocytes: 0.04 10*3/uL (ref 0.00–0.07)
Basophils Absolute: 0 10*3/uL (ref 0.0–0.1)
Basophils Relative: 0 %
Eosinophils Absolute: 0 10*3/uL (ref 0.0–0.5)
Eosinophils Relative: 0 %
HCT: 23.2 % — ABNORMAL LOW (ref 39.0–52.0)
Hemoglobin: 7.3 g/dL — ABNORMAL LOW (ref 13.0–17.0)
Immature Granulocytes: 1 %
Lymphocytes Relative: 2 %
Lymphs Abs: 0.2 10*3/uL — ABNORMAL LOW (ref 0.7–4.0)
MCH: 28.1 pg (ref 26.0–34.0)
MCHC: 31.5 g/dL (ref 30.0–36.0)
MCV: 89.2 fL (ref 80.0–100.0)
Monocytes Absolute: 0.4 10*3/uL (ref 0.1–1.0)
Monocytes Relative: 4 %
Neutro Abs: 7.4 10*3/uL (ref 1.7–7.7)
Neutrophils Relative %: 93 %
Platelets: 197 10*3/uL (ref 150–400)
RBC: 2.6 MIL/uL — ABNORMAL LOW (ref 4.22–5.81)
RDW: 15.8 % — ABNORMAL HIGH (ref 11.5–15.5)
WBC: 8 10*3/uL (ref 4.0–10.5)
nRBC: 0 % (ref 0.0–0.2)

## 2022-01-28 LAB — BASIC METABOLIC PANEL
Anion gap: 15 (ref 5–15)
BUN: 120 mg/dL — ABNORMAL HIGH (ref 8–23)
CO2: 24 mmol/L (ref 22–32)
Calcium: 8.3 mg/dL — ABNORMAL LOW (ref 8.9–10.3)
Chloride: 95 mmol/L — ABNORMAL LOW (ref 98–111)
Creatinine, Ser: 4.45 mg/dL — ABNORMAL HIGH (ref 0.61–1.24)
GFR, Estimated: 13 mL/min — ABNORMAL LOW (ref >60)
Glucose, Bld: 305 mg/dL — ABNORMAL HIGH (ref 70–99)
Potassium: 4.6 mmol/L (ref 3.5–5.1)
Sodium: 134 mmol/L — ABNORMAL LOW (ref 135–145)

## 2022-01-28 LAB — MAGNESIUM: Magnesium: 2.5 mg/dL — ABNORMAL HIGH (ref 1.7–2.4)

## 2022-01-28 MED ORDER — FLEET ENEMA 7-19 GM/118ML RE ENEM
1.0000 | ENEMA | Freq: Once | RECTAL | Status: AC
  Administered 2022-01-28: 1 via RECTAL
  Filled 2022-01-28: qty 1

## 2022-01-28 NOTE — ED Provider Notes (Signed)
[facility] EMERGENCY DEPARTMENT Provider Note   CSN: [id number] Arrival date & time: 01/28/22  1142     History  Chief Complaint  Patient presents with   Constipation    Adam Reid is a 76 y.o. male.  76 year old male brought in by wife with concern for no stool output x2 weeks.  Patient is passing gas, is eating his usual diet.  Patient was taking lactulose to help with bowel movements, was not having any relief and was switched to sorbitol and using suppositories.  Patient was seen at his hematologist yesterday who did an x-ray and told him he did not have a blockage and to ask his PCP if he could do an enema.  Patient called PCP to inquire about using an enema and was told not to, to go to the emergency room for evaluation.  Patient states that he is feeling in his usual state of health, denies abdominal pain or distention and states he feels like he could have a bowel movement.  Past medical history significant for thyroid disease, hypertension, diabetes, CKD (recent fistula placed), hyperlipidemia, CHF, GERD.       Home Medications Prior to Admission medications   Medication Sig Start Date End Date Taking? Authorizing Provider  amiodarone (PACERONE) 200 MG tablet Take 1 tablet (200 mg total) by mouth daily. Follow up appt required for further refills. Please call [phone] to schedule an appt Patient taking differently: Take 200 mg by mouth daily. 11/30/21   Clegg, Amy D, NP  atorvastatin (LIPITOR) 80 MG tablet TAKE 1 TABLET(80 MG) BY MOUTH DAILY Patient taking differently: Take 80 mg by mouth daily. 12/22/20   [name], MD  bisacodyl (DULCOLAX) 10 MG suppository Place 1 suppository (10 mg total) rectally as needed for moderate constipation. 12/31/21   [name], MD  blood glucose meter kit and supplies Use up to two times daily as directed  ICD10 E10.9 E11.9 01/18/22   [name], MD  blood glucose meter kit and supplies Use up to two  times daily as directed  ICD10 E10.9 E11.9 01/18/22   [name], MD  CALPHRON 667 MG tablet Take 1,334 mg by mouth 3 (three) times daily with meals. 11/09/21   [provider]  Cholecalciferol (VITAMIN D3 PO) Take 1 tablet by mouth daily.    [provider]  diltiazem (CARDIZEM CD) 180 MG 24 hr capsule TAKE 1 CAPSULE(180 MG) BY MOUTH DAILY Patient taking differently: Take 180 mg by mouth daily. 08/24/21   Bensimhon, Shaune Pascal, MD  doxazosin (CARDURA) 4 MG tablet Take 1 tablet (4 mg total) by mouth daily. 12/24/21 01/23/22  Shahmehdi, Valeria Batman, MD  ELIQUIS 2.5 MG TABS tablet TAKE 1 TABLET(2.5 MG) BY MOUTH TWICE DAILY Patient taking differently: Take 2.5 mg by mouth 2 (two) times daily. 10/30/21   Bensimhon, Shaune Pascal, MD  GlucoCom Lancets MISC Use for home glucose monitoring 09/02/15   Harrison Mons, PA  glucose blood (ONETOUCH VERIO) test strip Test up to 2 times per day.  Uncontrolled diabetes with hyperglycemia and stage 4 CKD. 04/13/19   [name], MD  HYDROmorphone (DILAUDID) 2 MG tablet Take 0.5 tablets (1 mg total) by mouth every 12 (twelve) hours as needed for moderate pain. 12/31/21   [name], MD  insulin glargine (LANTUS) 100 UNIT/ML Solostar Pen Inject 5 Units into the skin daily. 01/26/22   [name], MD  Insulin Pen Needle 31G X 5 MM MISC Use  as directed 12/31/21   [name], MD  lactulose (CHRONULAC) 10 GM/15ML solution Take 15 mLs (10 g total) by mouth 2 (two) times daily. Patient taking differently: Take 10 g by mouth 2 (two) times daily as needed for moderate constipation. 12/31/21   [name], MD  OXYGEN Inhale 2 L into the lungs continuous.    [provider]  sitaGLIPtin (JANUVIA) 25 MG tablet TAKE 1 TABLET(25 MG) BY MOUTH DAILY Patient taking differently: Take 25 mg by mouth daily. 08/31/21   [name], MD  torsemide (DEMADEX) 20 MG tablet Take 2 tablets (40 mg total) by mouth 2 (two) times daily. 10/05/21    [place], Maricela Bo, FNP  vitamin B-12 (CYANOCOBALAMIN) 500 MCG tablet Take 500 mcg by mouth daily.    [provider]      Allergies    Pork-derived products    Review of Systems   Review of Systems Negative except as per HPI Physical Exam Updated Vital Signs BP (!) 117/59   Pulse 68   Temp (!) 97.4 F (36.3 C)   Resp 20   Ht $R'5\' 6"'IT$  (1.676 m)   Wt 49.9 kg   SpO2 100%   BMI 17.75 kg/m  Physical Exam Vitals and nursing note reviewed.  Constitutional:      General: He is not in acute distress.    Appearance: He is well-developed. He is not diaphoretic.  HENT:     Head: Normocephalic and atraumatic.  Cardiovascular:     Rate and Rhythm: Normal rate and regular rhythm.     Heart sounds: Normal heart sounds.  Pulmonary:     Effort: Pulmonary effort is normal.     Breath sounds: Normal breath sounds.  Abdominal:     Palpations: Abdomen is soft.     Tenderness: There is no abdominal tenderness.  Skin:    General: Skin is warm and dry.     Findings: No erythema or rash.  Neurological:     Mental Status: He is alert and oriented to person, place, and time.  Psychiatric:        Behavior: Behavior normal.     ED Results / Procedures / Treatments   Labs (all labs ordered are listed, but only abnormal results are displayed) Labs Reviewed  BASIC METABOLIC PANEL - Abnormal; Notable for the following components:      Result Value   Sodium 134 (*)    Chloride 95 (*)    Glucose, Bld 305 (*)    BUN 120 (*)    Creatinine, Ser 4.45 (*)    Calcium 8.3 (*)    GFR, Estimated 13 (*)    All other components within normal limits  CBC WITH DIFFERENTIAL/PLATELET - Abnormal; Notable for the following components:   RBC 2.60 (*)    Hemoglobin 7.3 (*)    HCT 23.2 (*)    RDW 15.8 (*)    Lymphs Abs 0.2 (*)    All other components within normal limits  MAGNESIUM - Abnormal; Notable for the following components:   Magnesium 2.5 (*)    All other components within normal  limits    EKG None  Radiology CT Abdomen Pelvis Wo Contrast  Result Date: 01/28/2022 CLINICAL DATA:  Bowel obstruction suspected EXAM: CT ABDOMEN AND PELVIS WITHOUT CONTRAST TECHNIQUE: Multidetector CT imaging of the abdomen and pelvis was performed following the standard protocol without IV contrast. RADIATION DOSE REDUCTION: This exam was performed according to the departmental dose-optimization program which  includes automated exposure control, adjustment of the mA and/or kV according to patient size and/or use of iterative reconstruction technique. COMPARISON:  CT chest, abdomen and pelvis dated December 13, 2021 FINDINGS: Lower chest: Cardiomegaly. Small loculated right-greater-than-left pleural effusions with pleural thickening. Bilateral rounded airspace opacities which are likely due to round atelectasis. Ground-glass opacities of the lung bases with associated septal thickening. Hepatobiliary: No suspicious liver lesions. No biliary ductal dilation. Pancreas: Unremarkable. No pancreatic ductal dilatation or surrounding inflammatory changes. Spleen: Normal in size without focal abnormality. Adrenals/Urinary Tract: Thickening of the left-greater-than-right adrenal glands, similar to prior exam. No hydronephrosis or nephrolithiasis. Unchanged bilateral simple appearing and minimally complex renal cysts. Bladder is decompressed and contains a Foley catheter. Stomach/Bowel: Stomach is unremarkable. No bowel wall thickening or inflammatory change. Large stool burden seen throughout the colon. No evidence of obstruction. Vascular/Lymphatic: Aortic atherosclerosis. No definite enlarged lymph nodes seen in the abdomen or pelvis. Reproductive: Prostatomegaly. Other: Large left periaortic lesion which appears to be centrally necrotic measuring 7.0 x 5.6 cm, unchanged in size when compared with prior exam and remeasured in similar plane. Small right inguinal hernia containing nondilated loops of small bowel.  Musculoskeletal: No acute or significant osseous findings. IMPRESSION: 1. Large stool burden seen throughout the colon, compatible with constipation. No evidence of obstruction. 2. Stable size of large left retroperitoneal lesion. 3. Small right-greater-than-left chronic appearing pleural effusions. 4. Cardiomegaly and pulmonary edema. 5.  Aortic Atherosclerosis (ICD10-I70.0). Electronically Signed   By: Yetta Glassman M.D.   On: 01/28/2022 13:20    Procedures Procedures    Medications Ordered in ED Medications  sodium phosphate (FLEET) 7-19 GM/118ML enema 1 enema (1 enema Rectal Given 01/28/22 1526)    ED Course/ Medical Decision Making/ A&P                           Medical Decision Making Amount and/or Complexity of Data Reviewed Labs: ordered. Radiology: ordered.   This patient presents to the ED for concern of constipation, this involves an extensive number of treatment options, and is a complaint that carries with it a high risk of complications and morbidity.  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  Per report, mass intimately associated with adjacent structures including left renal artery, abdominal aorta, superior mesenteric artery, left adrenal gland, and multiple small bowel loops.   Lab Tests:  I Ordered, and personally interpreted labs.  The pertinent results include: CBC with hemoglobin of 7.3, similar to prior on file.  His magnesium is slightly elevated at 2.5.  Basic metabolic panel without acute changes to patient's chronic poor renal condition.   Imaging Studies  ordered:  I ordered imaging studies including CT abdomen pelvis I independently visualized and interpreted imaging which showed constipation, negative for bowel obstruction.  Chronic changes as noted I agree with the radiologist interpretation    Problem List / ED Course / Critical interventions / Medication management  76 year old male with concern for constipation as above.  CT shows constipation without complication at this time.  Labs unchanged from baseline.  Patient was provided with a Fleet enema with successful bowel movement.  Patient feels  better and is ready for discharge. I ordered medication including fleets enema for constipation Reevaluation of the patient after these medicines showed that the patient resolved I have reviewed the patients home medicines and have made adjustments as needed   Social Determinants of Health:  Cared for by wife, has PCP and oncology team  Test / Admission - Considered:  Admission considered however patient has had successful bowel movement after fleets enema and is ready for discharge.         Final Clinical Impression(s) / ED Diagnoses Final diagnoses:  Constipation, unspecified constipation type    Rx / DC Orders ED Discharge Orders     None         Tacy Learn, PA-C 01/28/22 [place], [place], DO 01/29/22 [phone]

## 2022-01-28 NOTE — ED Notes (Signed)
Reviewed discharge instructions with pts family. States understanding. Pt discharged to home at this time

## 2022-01-28 NOTE — ED Notes (Signed)
Patient states that he feels as if he could have a stool but cannt . Patient states that he is passing gas.

## 2022-01-28 NOTE — ED Triage Notes (Signed)
Constipation x 2 weeks. Has tried multiple medications from his PCP and now told to come to ED for eval

## 2022-01-28 NOTE — ED Notes (Signed)
Blood was drawn from left anticubital

## 2022-01-29 ENCOUNTER — Ambulatory Visit (HOSPITAL_COMMUNITY)
Admission: RE | Admit: 2022-01-29 | Discharge: 2022-01-29 | Disposition: A | Discharge status: Home / Self Care | Payer: Medicare Other | Source: Ambulatory Visit | Attending: Vascular Surgery | Admitting: Vascular Surgery

## 2022-01-29 ENCOUNTER — Other Ambulatory Visit: Payer: Self-pay

## 2022-01-29 ENCOUNTER — Encounter (HOSPITAL_COMMUNITY)
Admission: RE | Disposition: A | Discharge status: Home / Self Care | Payer: Self-pay | Source: Ambulatory Visit | Attending: Vascular Surgery

## 2022-01-29 DIAGNOSIS — N184 Chronic kidney disease, stage 4 (severe): Secondary | ICD-10-CM

## 2022-01-29 DIAGNOSIS — Y841 Kidney dialysis as the cause of abnormal reaction of the patient, or of later complication, without mention of misadventure at the time of the procedure: Secondary | ICD-10-CM | POA: Insufficient documentation

## 2022-01-29 DIAGNOSIS — Z992 Dependence on renal dialysis: Secondary | ICD-10-CM | POA: Diagnosis not present

## 2022-01-29 DIAGNOSIS — T82898A Other specified complication of vascular prosthetic devices, implants and grafts, initial encounter: Secondary | ICD-10-CM | POA: Insufficient documentation

## 2022-01-29 DIAGNOSIS — N185 Chronic kidney disease, stage 5: Secondary | ICD-10-CM | POA: Diagnosis not present

## 2022-01-29 DIAGNOSIS — N186 End stage renal disease: Secondary | ICD-10-CM | POA: Diagnosis not present

## 2022-01-29 HISTORY — PX: A/V FISTULAGRAM: CATH118298

## 2022-01-29 LAB — POCT I-STAT, CHEM 8
BUN: >130 mg/dL — ABNORMAL HIGH (ref 8–23)
Calcium, Ion: 1.04 mmol/L — ABNORMAL LOW (ref 1.15–1.40)
Chloride: 96 mmol/L — ABNORMAL LOW (ref 98–111)
Creatinine, Ser: 4.7 mg/dL — ABNORMAL HIGH (ref 0.61–1.24)
Glucose, Bld: 189 mg/dL — ABNORMAL HIGH (ref 70–99)
HCT: 26 % — ABNORMAL LOW (ref 39.0–52.0)
Hemoglobin: 8.8 g/dL — ABNORMAL LOW (ref 13.0–17.0)
Potassium: 4.3 mmol/L (ref 3.5–5.1)
Sodium: 134 mmol/L — ABNORMAL LOW (ref 135–145)
TCO2: 27 mmol/L (ref 22–32)

## 2022-01-29 SURGERY — A/V FISTULAGRAM
Anesthesia: LOCAL | Laterality: Right

## 2022-01-29 MED ORDER — IODIXANOL 320 MG/ML IV SOLN
INTRAVENOUS | Status: DC | PRN
  Administered 2022-01-29: 15 mL

## 2022-01-29 MED ORDER — HEPARIN (PORCINE) IN NACL 1000-0.9 UT/500ML-% IV SOLN
INTRAVENOUS | Status: AC
  Filled 2022-01-29: qty 500

## 2022-01-29 MED ORDER — LIDOCAINE HCL (PF) 1 % IJ SOLN
INTRAMUSCULAR | Status: DC | PRN
  Administered 2022-01-29: 5 mL

## 2022-01-29 MED ORDER — SODIUM CHLORIDE 0.9 % IV SOLN: INTRAVENOUS | Status: DC

## 2022-01-29 MED ORDER — LIDOCAINE HCL (PF) 1 % IJ SOLN
INTRAMUSCULAR | Status: AC
  Filled 2022-01-29: qty 30

## 2022-01-29 MED ORDER — HEPARIN (PORCINE) IN NACL 2000-0.9 UNIT/L-% IV SOLN
INTRAVENOUS | Status: AC
  Filled 2022-01-29: qty 1000

## 2022-01-29 MED ORDER — HEPARIN (PORCINE) IN NACL 1000-0.9 UT/500ML-% IV SOLN
INTRAVENOUS | Status: DC | PRN
  Administered 2022-01-29: 500 mL

## 2022-01-29 SURGICAL SUPPLY — 10 items

## 2022-01-29 NOTE — H&P (View-Only) (Signed)
   Patient name: Adam Reid MRN: [id number] DOB: January 08, 1946 Sex: male  REASON FOR VISIT:   Poorly functioning right upper arm access  HPI:   Adam Reid is a pleasant 76 y.o. male who dialyzes on Monday Wednesdays and Fridays.  In reviewing the records it looks like he had a right brachiocephalic fistula placed on 09/04/2021.  He was last seen in office on 10/20/2021 and it was working well.  He turns today and was set up for a fistulogram to evaluate his fistula which has not been working well.  Current Facility-Administered Medications  Medication Dose Route Frequency Provider Last Rate Last Admin   0.9 %  sodium chloride infusion   Intravenous Continuous [name], MD        REVIEW OF SYSTEMS:  '[X]'$  denotes positive finding, '[ ]'$  denotes negative finding Vascular    Leg swelling    Cardiac    Chest pain or chest pressure:    Shortness of breath upon exertion:    Short of breath when lying flat:    Irregular heart rhythm:    Constitutional    Fever or chills:     PHYSICAL EXAM:   Vitals:   01/29/22 1045 01/29/22 1130  BP: (!) 183/84   Pulse: 83   Temp: 98 F (36.7 C)   TempSrc: Oral   SpO2: 91% 91%  Weight: 49.9 kg   Height: '5\' 6"'$  (1.676 m)     GENERAL: The patient is a well-nourished male, in no acute distress. The vital signs are documented above. CARDIOVASCULAR: There is a regular rate and rhythm. PULMONARY: There is good air exchange bilaterally without wheezing or rales. VASCULAR: Palpable thrill in right upper arm fistula.  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  We will make further recommendations pending these results.  [name] [facility] [phone]

## 2022-01-29 NOTE — H&P (Signed)
   Patient name: Adam Reid MRN: [id number] DOB: Dec 03, 1945 Sex: male  REASON FOR VISIT:   Poorly functioning right upper arm access  HPI:   Adam Reid is a pleasant 76 y.o. male who dialyzes on Monday Wednesdays and Fridays.  In reviewing the records it looks like he had a right brachiocephalic fistula placed on 09/04/2021.  He was last seen in office on 10/20/2021 and it was working well.  He turns today and was set up for a fistulogram to evaluate his fistula which has not been working well.  Current Facility-Administered Medications  Medication Dose Route Frequency Provider Last Rate Last Admin   0.9 %  sodium chloride infusion   Intravenous Continuous [name], MD        REVIEW OF SYSTEMS:  '[X]'$  denotes positive finding, '[ ]'$  denotes negative finding Vascular    Leg swelling    Cardiac    Chest pain or chest pressure:    Shortness of breath upon exertion:    Short of breath when lying flat:    Irregular heart rhythm:    Constitutional    Fever or chills:     PHYSICAL EXAM:   Vitals:   01/29/22 1045 01/29/22 1130  BP: (!) 183/84   Pulse: 83   Temp: 98 F (36.7 C)   TempSrc: Oral   SpO2: 91% 91%  Weight: 49.9 kg   Height: '5\' 6"'$  (1.676 m)     GENERAL: The patient is a well-nourished male, in no acute distress. The vital signs are documented above. CARDIOVASCULAR: There is a regular rate and rhythm. PULMONARY: There is good air exchange bilaterally without wheezing or rales. VASCULAR: Palpable thrill in right upper arm fistula.  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  We will make further recommendations pending these results.  [name] [facility] [phone]

## 2022-01-29 NOTE — Op Note (Signed)
   PATIENT: Adam Reid      MRN: [id number] DOB: 10-Oct-1945    DATE OF PROCEDURE: 01/29/2022  INDICATIONS:    [name] is a 76 y.o. male who was set up for a fistulogram as his fistula was nonfunctioning.  The history is obtained through the translator and as I understand that he is not yet on dialysis.  His fistula was placed on 09/04/2021.  It was a right brachiocephalic fistula.   PROCEDURE:    Fistulogram right brachiocephalic AV fistula  SURGEON: Judeth Cornfield. Scot Dock, MD, FACS  ANESTHESIA: Local  EBL: Minimal  TECHNIQUE: The patient was brought to the peripheral vascular lab and the right arm was prepped and draped in usual sterile fashion.  After the skin was anesthetized with 1% lidocaine I cannulated the vein with a micropuncture needle and a micropuncture sheath was introduced over a wire.  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  There was reflux into the basilic vein which did appear to be patent.  There was no evidence of central venous obstruction.  I did attempt to pass a wire through the fistula but this was chronically occluded and this was unsuccessful.  A 4-0 Monocryl was used to close the cannulation site and there was good hemostasis.   FINDINGS:   No central venous occlusion 2.  Patent basilic vein right arm 3.  Occluded right brachiocephalic fistula  CLINICAL NOTE: The patient will be scheduled for either a basilic vein transposition on the right or an AV graft.  We will discuss with nephrology if he needs a catheter.  [name], MD, FACS [facility]  DATE OF DICTATION:   01/29/2022

## 2022-02-01 ENCOUNTER — Encounter (HOSPITAL_COMMUNITY): Payer: Self-pay | Admitting: Vascular Surgery

## 2022-02-03 DIAGNOSIS — Z794 Long term (current) use of insulin: Secondary | ICD-10-CM | POA: Diagnosis not present

## 2022-02-03 DIAGNOSIS — F32A Depression, unspecified: Secondary | ICD-10-CM | POA: Diagnosis not present

## 2022-02-03 DIAGNOSIS — I4892 Unspecified atrial flutter: Secondary | ICD-10-CM | POA: Diagnosis not present

## 2022-02-03 DIAGNOSIS — Z7901 Long term (current) use of anticoagulants: Secondary | ICD-10-CM | POA: Diagnosis not present

## 2022-02-03 DIAGNOSIS — E785 Hyperlipidemia, unspecified: Secondary | ICD-10-CM | POA: Diagnosis not present

## 2022-02-03 DIAGNOSIS — E1122 Type 2 diabetes mellitus with diabetic chronic kidney disease: Secondary | ICD-10-CM | POA: Diagnosis not present

## 2022-02-03 DIAGNOSIS — N179 Acute kidney failure, unspecified: Secondary | ICD-10-CM | POA: Diagnosis not present

## 2022-02-03 DIAGNOSIS — D630 Anemia in neoplastic disease: Secondary | ICD-10-CM | POA: Diagnosis not present

## 2022-02-03 DIAGNOSIS — D483 Neoplasm of uncertain behavior of retroperitoneum: Secondary | ICD-10-CM | POA: Diagnosis not present

## 2022-02-03 DIAGNOSIS — D631 Anemia in chronic kidney disease: Secondary | ICD-10-CM | POA: Diagnosis not present

## 2022-02-03 DIAGNOSIS — N186 End stage renal disease: Secondary | ICD-10-CM | POA: Diagnosis not present

## 2022-02-03 DIAGNOSIS — Z7984 Long term (current) use of oral hypoglycemic drugs: Secondary | ICD-10-CM | POA: Diagnosis not present

## 2022-02-03 DIAGNOSIS — Z466 Encounter for fitting and adjustment of urinary device: Secondary | ICD-10-CM | POA: Diagnosis not present

## 2022-02-03 DIAGNOSIS — I2510 Atherosclerotic heart disease of native coronary artery without angina pectoris: Secondary | ICD-10-CM | POA: Diagnosis not present

## 2022-02-03 DIAGNOSIS — E440 Moderate protein-calorie malnutrition: Secondary | ICD-10-CM | POA: Diagnosis not present

## 2022-02-03 DIAGNOSIS — I480 Paroxysmal atrial fibrillation: Secondary | ICD-10-CM | POA: Diagnosis not present

## 2022-02-03 DIAGNOSIS — R338 Other retention of urine: Secondary | ICD-10-CM | POA: Diagnosis not present

## 2022-02-03 DIAGNOSIS — K219 Gastro-esophageal reflux disease without esophagitis: Secondary | ICD-10-CM | POA: Diagnosis not present

## 2022-02-03 DIAGNOSIS — J9611 Chronic respiratory failure with hypoxia: Secondary | ICD-10-CM | POA: Diagnosis not present

## 2022-02-03 DIAGNOSIS — I132 Hypertensive heart and chronic kidney disease with heart failure and with stage 5 chronic kidney disease, or end stage renal disease: Secondary | ICD-10-CM | POA: Diagnosis not present

## 2022-02-03 DIAGNOSIS — D509 Iron deficiency anemia, unspecified: Secondary | ICD-10-CM | POA: Diagnosis not present

## 2022-02-03 DIAGNOSIS — J9811 Atelectasis: Secondary | ICD-10-CM | POA: Diagnosis not present

## 2022-02-03 DIAGNOSIS — N2581 Secondary hyperparathyroidism of renal origin: Secondary | ICD-10-CM | POA: Diagnosis not present

## 2022-02-03 DIAGNOSIS — I5033 Acute on chronic diastolic (congestive) heart failure: Secondary | ICD-10-CM | POA: Diagnosis not present

## 2022-02-04 DIAGNOSIS — D483 Neoplasm of uncertain behavior of retroperitoneum: Secondary | ICD-10-CM | POA: Diagnosis not present

## 2022-02-04 DIAGNOSIS — J9611 Chronic respiratory failure with hypoxia: Secondary | ICD-10-CM | POA: Diagnosis not present

## 2022-02-04 DIAGNOSIS — I2510 Atherosclerotic heart disease of native coronary artery without angina pectoris: Secondary | ICD-10-CM | POA: Diagnosis not present

## 2022-02-04 DIAGNOSIS — J9811 Atelectasis: Secondary | ICD-10-CM | POA: Diagnosis not present

## 2022-02-04 DIAGNOSIS — D631 Anemia in chronic kidney disease: Secondary | ICD-10-CM | POA: Diagnosis not present

## 2022-02-04 DIAGNOSIS — N2581 Secondary hyperparathyroidism of renal origin: Secondary | ICD-10-CM | POA: Diagnosis not present

## 2022-02-04 DIAGNOSIS — N179 Acute kidney failure, unspecified: Secondary | ICD-10-CM | POA: Diagnosis not present

## 2022-02-04 DIAGNOSIS — Z794 Long term (current) use of insulin: Secondary | ICD-10-CM | POA: Diagnosis not present

## 2022-02-04 DIAGNOSIS — Z466 Encounter for fitting and adjustment of urinary device: Secondary | ICD-10-CM | POA: Diagnosis not present

## 2022-02-04 DIAGNOSIS — K219 Gastro-esophageal reflux disease without esophagitis: Secondary | ICD-10-CM | POA: Diagnosis not present

## 2022-02-04 DIAGNOSIS — F32A Depression, unspecified: Secondary | ICD-10-CM | POA: Diagnosis not present

## 2022-02-04 DIAGNOSIS — I5033 Acute on chronic diastolic (congestive) heart failure: Secondary | ICD-10-CM | POA: Diagnosis not present

## 2022-02-04 DIAGNOSIS — D630 Anemia in neoplastic disease: Secondary | ICD-10-CM | POA: Diagnosis not present

## 2022-02-04 DIAGNOSIS — E1122 Type 2 diabetes mellitus with diabetic chronic kidney disease: Secondary | ICD-10-CM | POA: Diagnosis not present

## 2022-02-04 DIAGNOSIS — I132 Hypertensive heart and chronic kidney disease with heart failure and with stage 5 chronic kidney disease, or end stage renal disease: Secondary | ICD-10-CM | POA: Diagnosis not present

## 2022-02-04 DIAGNOSIS — I5032 Chronic diastolic (congestive) heart failure: Secondary | ICD-10-CM | POA: Diagnosis not present

## 2022-02-04 DIAGNOSIS — I480 Paroxysmal atrial fibrillation: Secondary | ICD-10-CM | POA: Diagnosis not present

## 2022-02-04 DIAGNOSIS — Z7984 Long term (current) use of oral hypoglycemic drugs: Secondary | ICD-10-CM | POA: Diagnosis not present

## 2022-02-04 DIAGNOSIS — E440 Moderate protein-calorie malnutrition: Secondary | ICD-10-CM | POA: Diagnosis not present

## 2022-02-04 DIAGNOSIS — E785 Hyperlipidemia, unspecified: Secondary | ICD-10-CM | POA: Diagnosis not present

## 2022-02-04 DIAGNOSIS — R338 Other retention of urine: Secondary | ICD-10-CM | POA: Diagnosis not present

## 2022-02-04 DIAGNOSIS — I4892 Unspecified atrial flutter: Secondary | ICD-10-CM | POA: Diagnosis not present

## 2022-02-04 DIAGNOSIS — N186 End stage renal disease: Secondary | ICD-10-CM | POA: Diagnosis not present

## 2022-02-04 DIAGNOSIS — Z7901 Long term (current) use of anticoagulants: Secondary | ICD-10-CM | POA: Diagnosis not present

## 2022-02-04 DIAGNOSIS — D509 Iron deficiency anemia, unspecified: Secondary | ICD-10-CM | POA: Diagnosis not present

## 2022-02-05 ENCOUNTER — Encounter (HOSPITAL_COMMUNITY): Payer: Self-pay | Admitting: Vascular Surgery

## 2022-02-05 NOTE — Progress Notes (Addendum)
I called Mr. Grimsley using [address] [place], [place] # [phone].  Patient's wife, Louanna Raw, answered the phone and said that patient is sleeping and that she is a designated party release.    Laden reports that patient denies having any s/s of Covid in her household as is unaware any exposure to Covid. Laden speaks [place], I kept the interpreter on the phone to assist as needed.  Mr. Steve has type II diabetes, Etheleen Sia reports that blood sugars run 115 -119. Last A1C was 7.4 drawn on 11/23/21.  I instructed Laden to give patient 2 units of Lantus , Monday morning if CBG is greater than 70.  I also asked Laden to I instructed patient to check CBG after awaking and every 2 hours until arrival  to the hospital.  I Instructed patient if CBG is less than 70 to take 4 Glucose Tablets or 1 tube of Glucose Gel or 1/2 cup of a clear juice. Recheck CBG in 15 minutes if CBG is not over 70 call, pre- op desk at [phone] for further instructions. If scheduled to receive Insulin, do not take Insulin.  Mr. Aspinall has an indwelling catheter.  Mr. Easler PCP is Dr. Merri Ray. Cardiologist is Dr. Cherlynn Kaiser

## 2022-02-07 NOTE — Anesthesia Preprocedure Evaluation (Signed)
Anesthesia Evaluation  Patient identified by MRN, date of birth, ID band Patient awake    Reviewed: Allergy & Precautions, NPO status , Patient's Chart, lab work & pertinent test results  Airway Mallampati: II  TM Distance: >3 FB Neck ROM: Full    Dental no notable dental hx.    Pulmonary neg pulmonary ROS, Current Smoker and Patient abstained from smoking.,    Pulmonary exam normal        Cardiovascular hypertension, +CHF  + dysrhythmias Atrial Fibrillation  Rhythm:Regular Rate:Normal     Neuro/Psych Anxiety Depression negative neurological ROS     GI/Hepatic Neg liver ROS, GERD  ,  Endo/Other  diabetes, Type 2, Insulin Dependent, Oral Hypoglycemic Agents  Renal/GU ESRF and DialysisRenal disease     Musculoskeletal negative musculoskeletal ROS (+)   Abdominal Normal abdominal exam  (+)   Peds  Hematology  (+) Blood dyscrasia, anemia ,   Anesthesia Other Findings   Reproductive/Obstetrics                            Anesthesia Physical Anesthesia Plan  ASA: 3  Anesthesia Plan: MAC and Regional   Post-op Pain Management: Regional block*   Induction: Intravenous  PONV Risk Score and Plan: Ondansetron, Dexamethasone and Treatment may vary due to age or medical condition  Airway Management Planned: Simple Face Mask, Natural Airway and Nasal Cannula  Additional Equipment: None  Intra-op Plan:   Post-operative Plan:   Informed Consent: I have reviewed the patients History and Physical, chart, labs and discussed the procedure including the risks, benefits and alternatives for the proposed anesthesia with the patient or authorized representative who has indicated his/her understanding and acceptance.     Dental advisory given and Interpreter used for interveiw  Plan Discussed with: CRNA  Anesthesia Plan Comments: (Lab Results      Component                Value               Date                       WBC                      8.0                 01/28/2022                HGB                      8.8 (L)             01/29/2022                HCT                      26.0 (L)            01/29/2022                MCV                      89.2                01/28/2022                PLT  197                 01/28/2022           Lab Results      Component                Value               Date                      NA                       134 (L)             01/29/2022                K                        4.3                 01/29/2022                CO2                      24                  01/28/2022                GLUCOSE                  189 (H)             01/29/2022                BUN                      >130 (H)            01/29/2022                CREATININE               4.70 (H)            01/29/2022                CALCIUM                  8.3 (L)             01/28/2022                GFRNONAA                 13 (L)              01/28/2022          )       Anesthesia Quick Evaluation

## 2022-02-08 ENCOUNTER — Ambulatory Visit: Payer: Medicare Other | Admitting: Family Medicine

## 2022-02-08 ENCOUNTER — Encounter (HOSPITAL_COMMUNITY)
Admission: RE | Disposition: A | Discharge status: Home / Self Care | Payer: Self-pay | Source: Ambulatory Visit | Attending: Vascular Surgery

## 2022-02-08 ENCOUNTER — Telehealth (INDEPENDENT_AMBULATORY_CARE_PROVIDER_SITE_OTHER): Payer: Medicare Other | Admitting: Family Medicine

## 2022-02-08 ENCOUNTER — Encounter (HOSPITAL_COMMUNITY): Payer: Self-pay | Admitting: Vascular Surgery

## 2022-02-08 ENCOUNTER — Other Ambulatory Visit: Payer: Self-pay

## 2022-02-08 ENCOUNTER — Ambulatory Visit (HOSPITAL_COMMUNITY): Payer: Medicare Other | Admitting: Anesthesiology

## 2022-02-08 ENCOUNTER — Encounter: Payer: Self-pay | Admitting: Family Medicine

## 2022-02-08 ENCOUNTER — Ambulatory Visit (HOSPITAL_COMMUNITY)
Admission: RE | Admit: 2022-02-08 | Discharge: 2022-02-08 | Disposition: A | Discharge status: Home / Self Care | Payer: Medicare Other | Source: Ambulatory Visit | Attending: Vascular Surgery | Admitting: Vascular Surgery

## 2022-02-08 ENCOUNTER — Ambulatory Visit (HOSPITAL_BASED_OUTPATIENT_CLINIC_OR_DEPARTMENT_OTHER): Payer: Medicare Other | Admitting: Anesthesiology

## 2022-02-08 VITALS — Temp 98.6°F | Wt 115.0 lb

## 2022-02-08 DIAGNOSIS — K920 Hematemesis: Secondary | ICD-10-CM | POA: Diagnosis not present

## 2022-02-08 DIAGNOSIS — Y832 Surgical operation with anastomosis, bypass or graft as the cause of abnormal reaction of the patient, or of later complication, without mention of misadventure at the time of the procedure: Secondary | ICD-10-CM | POA: Insufficient documentation

## 2022-02-08 DIAGNOSIS — D630 Anemia in neoplastic disease: Secondary | ICD-10-CM | POA: Diagnosis not present

## 2022-02-08 DIAGNOSIS — J90 Pleural effusion, not elsewhere classified: Secondary | ICD-10-CM | POA: Diagnosis not present

## 2022-02-08 DIAGNOSIS — R3914 Feeling of incomplete bladder emptying: Secondary | ICD-10-CM | POA: Diagnosis not present

## 2022-02-08 DIAGNOSIS — R001 Bradycardia, unspecified: Secondary | ICD-10-CM | POA: Diagnosis not present

## 2022-02-08 DIAGNOSIS — D483 Neoplasm of uncertain behavior of retroperitoneum: Secondary | ICD-10-CM | POA: Diagnosis not present

## 2022-02-08 DIAGNOSIS — R338 Other retention of urine: Secondary | ICD-10-CM | POA: Diagnosis not present

## 2022-02-08 DIAGNOSIS — I5030 Unspecified diastolic (congestive) heart failure: Secondary | ICD-10-CM | POA: Diagnosis not present

## 2022-02-08 DIAGNOSIS — N2581 Secondary hyperparathyroidism of renal origin: Secondary | ICD-10-CM | POA: Diagnosis not present

## 2022-02-08 DIAGNOSIS — E875 Hyperkalemia: Secondary | ICD-10-CM | POA: Diagnosis not present

## 2022-02-08 DIAGNOSIS — K219 Gastro-esophageal reflux disease without esophagitis: Secondary | ICD-10-CM | POA: Diagnosis not present

## 2022-02-08 DIAGNOSIS — D759 Disease of blood and blood-forming organs, unspecified: Secondary | ICD-10-CM | POA: Insufficient documentation

## 2022-02-08 DIAGNOSIS — Z992 Dependence on renal dialysis: Secondary | ICD-10-CM | POA: Diagnosis not present

## 2022-02-08 DIAGNOSIS — I5032 Chronic diastolic (congestive) heart failure: Secondary | ICD-10-CM | POA: Diagnosis not present

## 2022-02-08 DIAGNOSIS — E440 Moderate protein-calorie malnutrition: Secondary | ICD-10-CM | POA: Diagnosis not present

## 2022-02-08 DIAGNOSIS — N390 Urinary tract infection, site not specified: Secondary | ICD-10-CM | POA: Diagnosis not present

## 2022-02-08 DIAGNOSIS — I509 Heart failure, unspecified: Secondary | ICD-10-CM

## 2022-02-08 DIAGNOSIS — E11649 Type 2 diabetes mellitus with hypoglycemia without coma: Secondary | ICD-10-CM | POA: Diagnosis not present

## 2022-02-08 DIAGNOSIS — Z7901 Long term (current) use of anticoagulants: Secondary | ICD-10-CM | POA: Diagnosis not present

## 2022-02-08 DIAGNOSIS — R531 Weakness: Secondary | ICD-10-CM | POA: Diagnosis not present

## 2022-02-08 DIAGNOSIS — N185 Chronic kidney disease, stage 5: Secondary | ICD-10-CM | POA: Diagnosis not present

## 2022-02-08 DIAGNOSIS — I4892 Unspecified atrial flutter: Secondary | ICD-10-CM | POA: Diagnosis not present

## 2022-02-08 DIAGNOSIS — E1122 Type 2 diabetes mellitus with diabetic chronic kidney disease: Secondary | ICD-10-CM | POA: Diagnosis not present

## 2022-02-08 DIAGNOSIS — N186 End stage renal disease: Secondary | ICD-10-CM | POA: Insufficient documentation

## 2022-02-08 DIAGNOSIS — D447 Neoplasm of uncertain behavior of aortic body and other paraganglia: Secondary | ICD-10-CM

## 2022-02-08 DIAGNOSIS — Z66 Do not resuscitate: Secondary | ICD-10-CM | POA: Diagnosis not present

## 2022-02-08 DIAGNOSIS — D509 Iron deficiency anemia, unspecified: Secondary | ICD-10-CM | POA: Diagnosis not present

## 2022-02-08 DIAGNOSIS — R6510 Systemic inflammatory response syndrome (SIRS) of non-infectious origin without acute organ dysfunction: Secondary | ICD-10-CM | POA: Diagnosis not present

## 2022-02-08 DIAGNOSIS — D62 Acute posthemorrhagic anemia: Secondary | ICD-10-CM | POA: Diagnosis not present

## 2022-02-08 DIAGNOSIS — I959 Hypotension, unspecified: Secondary | ICD-10-CM | POA: Diagnosis not present

## 2022-02-08 DIAGNOSIS — F32A Depression, unspecified: Secondary | ICD-10-CM | POA: Diagnosis not present

## 2022-02-08 DIAGNOSIS — T82590A Other mechanical complication of surgically created arteriovenous fistula, initial encounter: Secondary | ICD-10-CM | POA: Diagnosis not present

## 2022-02-08 DIAGNOSIS — Z743 Need for continuous supervision: Secondary | ICD-10-CM | POA: Diagnosis not present

## 2022-02-08 DIAGNOSIS — Z794 Long term (current) use of insulin: Secondary | ICD-10-CM | POA: Insufficient documentation

## 2022-02-08 DIAGNOSIS — D684 Acquired coagulation factor deficiency: Secondary | ICD-10-CM | POA: Diagnosis not present

## 2022-02-08 DIAGNOSIS — G9340 Encephalopathy, unspecified: Secondary | ICD-10-CM | POA: Diagnosis not present

## 2022-02-08 DIAGNOSIS — R627 Adult failure to thrive: Secondary | ICD-10-CM | POA: Diagnosis not present

## 2022-02-08 DIAGNOSIS — I480 Paroxysmal atrial fibrillation: Secondary | ICD-10-CM | POA: Diagnosis not present

## 2022-02-08 DIAGNOSIS — I132 Hypertensive heart and chronic kidney disease with heart failure and with stage 5 chronic kidney disease, or end stage renal disease: Secondary | ICD-10-CM | POA: Insufficient documentation

## 2022-02-08 DIAGNOSIS — K761 Chronic passive congestion of liver: Secondary | ICD-10-CM | POA: Diagnosis not present

## 2022-02-08 DIAGNOSIS — N184 Chronic kidney disease, stage 4 (severe): Secondary | ICD-10-CM

## 2022-02-08 DIAGNOSIS — K7200 Acute and subacute hepatic failure without coma: Secondary | ICD-10-CM | POA: Diagnosis not present

## 2022-02-08 DIAGNOSIS — I2510 Atherosclerotic heart disease of native coronary artery without angina pectoris: Secondary | ICD-10-CM | POA: Diagnosis not present

## 2022-02-08 DIAGNOSIS — J9621 Acute and chronic respiratory failure with hypoxia: Secondary | ICD-10-CM | POA: Diagnosis not present

## 2022-02-08 DIAGNOSIS — R9431 Abnormal electrocardiogram [ECG] [EKG]: Secondary | ICD-10-CM | POA: Diagnosis not present

## 2022-02-08 DIAGNOSIS — R17 Unspecified jaundice: Secondary | ICD-10-CM | POA: Diagnosis not present

## 2022-02-08 DIAGNOSIS — R7989 Other specified abnormal findings of blood chemistry: Secondary | ICD-10-CM | POA: Diagnosis not present

## 2022-02-08 DIAGNOSIS — D649 Anemia, unspecified: Secondary | ICD-10-CM | POA: Insufficient documentation

## 2022-02-08 DIAGNOSIS — I129 Hypertensive chronic kidney disease with stage 1 through stage 4 chronic kidney disease, or unspecified chronic kidney disease: Secondary | ICD-10-CM | POA: Diagnosis not present

## 2022-02-08 DIAGNOSIS — D631 Anemia in chronic kidney disease: Secondary | ICD-10-CM | POA: Diagnosis not present

## 2022-02-08 DIAGNOSIS — R748 Abnormal levels of other serum enzymes: Secondary | ICD-10-CM | POA: Diagnosis not present

## 2022-02-08 DIAGNOSIS — I5033 Acute on chronic diastolic (congestive) heart failure: Secondary | ICD-10-CM | POA: Diagnosis not present

## 2022-02-08 DIAGNOSIS — R0902 Hypoxemia: Secondary | ICD-10-CM | POA: Diagnosis not present

## 2022-02-08 DIAGNOSIS — R0600 Dyspnea, unspecified: Secondary | ICD-10-CM | POA: Diagnosis not present

## 2022-02-08 DIAGNOSIS — A4150 Gram-negative sepsis, unspecified: Secondary | ICD-10-CM | POA: Diagnosis not present

## 2022-02-08 DIAGNOSIS — Z7189 Other specified counseling: Secondary | ICD-10-CM | POA: Diagnosis not present

## 2022-02-08 DIAGNOSIS — F17200 Nicotine dependence, unspecified, uncomplicated: Secondary | ICD-10-CM | POA: Insufficient documentation

## 2022-02-08 DIAGNOSIS — D61818 Other pancytopenia: Secondary | ICD-10-CM | POA: Diagnosis not present

## 2022-02-08 DIAGNOSIS — R6889 Other general symptoms and signs: Secondary | ICD-10-CM | POA: Diagnosis not present

## 2022-02-08 DIAGNOSIS — I700 Atherosclerosis of aorta: Secondary | ICD-10-CM | POA: Diagnosis not present

## 2022-02-08 DIAGNOSIS — Z466 Encounter for fitting and adjustment of urinary device: Secondary | ICD-10-CM | POA: Diagnosis not present

## 2022-02-08 DIAGNOSIS — I10 Essential (primary) hypertension: Secondary | ICD-10-CM | POA: Diagnosis not present

## 2022-02-08 DIAGNOSIS — R609 Edema, unspecified: Secondary | ICD-10-CM | POA: Diagnosis not present

## 2022-02-08 DIAGNOSIS — E785 Hyperlipidemia, unspecified: Secondary | ICD-10-CM | POA: Diagnosis not present

## 2022-02-08 DIAGNOSIS — I499 Cardiac arrhythmia, unspecified: Secondary | ICD-10-CM | POA: Diagnosis not present

## 2022-02-08 DIAGNOSIS — Z7984 Long term (current) use of oral hypoglycemic drugs: Secondary | ICD-10-CM | POA: Insufficient documentation

## 2022-02-08 DIAGNOSIS — N179 Acute kidney failure, unspecified: Secondary | ICD-10-CM | POA: Diagnosis not present

## 2022-02-08 DIAGNOSIS — J9611 Chronic respiratory failure with hypoxia: Secondary | ICD-10-CM | POA: Diagnosis not present

## 2022-02-08 DIAGNOSIS — R64 Cachexia: Secondary | ICD-10-CM | POA: Diagnosis not present

## 2022-02-08 DIAGNOSIS — Z515 Encounter for palliative care: Secondary | ICD-10-CM | POA: Diagnosis not present

## 2022-02-08 DIAGNOSIS — E871 Hypo-osmolality and hyponatremia: Secondary | ICD-10-CM | POA: Diagnosis not present

## 2022-02-08 DIAGNOSIS — E43 Unspecified severe protein-calorie malnutrition: Secondary | ICD-10-CM | POA: Diagnosis not present

## 2022-02-08 DIAGNOSIS — K5900 Constipation, unspecified: Secondary | ICD-10-CM

## 2022-02-08 DIAGNOSIS — A4152 Sepsis due to Pseudomonas: Secondary | ICD-10-CM | POA: Diagnosis not present

## 2022-02-08 DIAGNOSIS — J9811 Atelectasis: Secondary | ICD-10-CM | POA: Diagnosis not present

## 2022-02-08 DIAGNOSIS — N189 Chronic kidney disease, unspecified: Secondary | ICD-10-CM | POA: Diagnosis not present

## 2022-02-08 HISTORY — DX: Dyspnea, unspecified: R06.00

## 2022-02-08 HISTORY — PX: BASCILIC VEIN TRANSPOSITION: SHX5742

## 2022-02-08 HISTORY — DX: Anxiety disorder, unspecified: F41.9

## 2022-02-08 HISTORY — DX: Benign prostatic hyperplasia without lower urinary tract symptoms: N40.0

## 2022-02-08 HISTORY — DX: Malignant (primary) neoplasm, unspecified: C80.1

## 2022-02-08 HISTORY — DX: Cardiac murmur, unspecified: R01.1

## 2022-02-08 HISTORY — DX: End stage renal disease: N18.6

## 2022-02-08 HISTORY — DX: Paroxysmal atrial fibrillation: I48.0

## 2022-02-08 LAB — POCT I-STAT, CHEM 8
BUN: 129 mg/dL — ABNORMAL HIGH (ref 8–23)
Calcium, Ion: 1.02 mmol/L — ABNORMAL LOW (ref 1.15–1.40)
Chloride: 101 mmol/L (ref 98–111)
Creatinine, Ser: 5 mg/dL — ABNORMAL HIGH (ref 0.61–1.24)
Glucose, Bld: 148 mg/dL — ABNORMAL HIGH (ref 70–99)
HCT: 24 % — ABNORMAL LOW (ref 39.0–52.0)
Hemoglobin: 8.2 g/dL — ABNORMAL LOW (ref 13.0–17.0)
Potassium: 4.5 mmol/L (ref 3.5–5.1)
Sodium: 136 mmol/L (ref 135–145)
TCO2: 22 mmol/L (ref 22–32)

## 2022-02-08 LAB — GLUCOSE, CAPILLARY
Glucose-Capillary: 158 mg/dL — ABNORMAL HIGH (ref 70–99)
Glucose-Capillary: 156 mg/dL — ABNORMAL HIGH (ref 70–99)
Glucose-Capillary: 168 mg/dL — ABNORMAL HIGH (ref 70–99)

## 2022-02-08 SURGERY — TRANSPOSITION, VEIN, BASILIC
Anesthesia: Monitor Anesthesia Care | Laterality: Right

## 2022-02-08 MED ORDER — OXYCODONE-ACETAMINOPHEN 5-325 MG PO TABS: 1.0000 | ORAL_TABLET | ORAL | 0 refills | Status: AC | PRN

## 2022-02-08 MED ORDER — PROTAMINE SULFATE 10 MG/ML IV SOLN
INTRAVENOUS | Status: AC
  Filled 2022-02-08: qty 5

## 2022-02-08 MED ORDER — SODIUM CHLORIDE 0.9 % IV SOLN: INTRAVENOUS | Status: DC

## 2022-02-08 MED ORDER — ROPIVACAINE HCL 5 MG/ML IJ SOLN
INTRAMUSCULAR | Status: DC | PRN
  Administered 2022-02-08: 10 mL via PERINEURAL

## 2022-02-08 MED ORDER — FENTANYL CITRATE (PF) 250 MCG/5ML IJ SOLN
INTRAMUSCULAR | Status: AC
  Filled 2022-02-08: qty 5

## 2022-02-08 MED ORDER — ONDANSETRON HCL 4 MG/2ML IJ SOLN
4.0000 mg | Freq: Once | INTRAMUSCULAR | Status: AC
Start: 2022-02-08 — End: 2022-02-08

## 2022-02-08 MED ORDER — FREESTYLE LIBRE 2 READER DEVI: 1.0000 | 1 refills | Status: AC

## 2022-02-08 MED ORDER — HEPARIN 6000 UNIT IRRIGATION SOLUTION
Status: AC
  Filled 2022-02-08: qty 500

## 2022-02-08 MED ORDER — FENTANYL CITRATE PF 50 MCG/ML IJ SOSY
50.0000 ug | PREFILLED_SYRINGE | Freq: Once | INTRAMUSCULAR | Status: DC
  Filled 2022-02-08: qty 1

## 2022-02-08 MED ORDER — PROPOFOL 10 MG/ML IV BOLUS
INTRAVENOUS | Status: AC
  Filled 2022-02-08: qty 20

## 2022-02-08 MED ORDER — LIDOCAINE HCL (PF) 1 % IJ SOLN
INTRAMUSCULAR | Status: AC
  Filled 2022-02-08: qty 30

## 2022-02-08 MED ORDER — PROTAMINE SULFATE 10 MG/ML IV SOLN
INTRAVENOUS | Status: DC | PRN
  Administered 2022-02-08: 30 mg via INTRAVENOUS
  Administered 2022-02-08: 10 mg via INTRAVENOUS

## 2022-02-08 MED ORDER — HEPARIN 6000 UNIT IRRIGATION SOLUTION
Status: DC | PRN
  Administered 2022-02-08: 1

## 2022-02-08 MED ORDER — ONDANSETRON HCL 4 MG/2ML IJ SOLN
INTRAMUSCULAR | Status: AC
  Administered 2022-02-08: 4 mg via INTRAVENOUS
  Filled 2022-02-08: qty 2

## 2022-02-08 MED ORDER — PAPAVERINE HCL 30 MG/ML IJ SOLN
INTRAMUSCULAR | Status: AC
  Filled 2022-02-08: qty 2

## 2022-02-08 MED ORDER — 0.9 % SODIUM CHLORIDE (POUR BTL) OPTIME
TOPICAL | Status: DC | PRN
  Administered 2022-02-08: 1000 mL

## 2022-02-08 MED ORDER — CHLORHEXIDINE GLUCONATE 4 % EX LIQD: 60.0000 mL | Freq: Once | CUTANEOUS | Status: DC

## 2022-02-08 MED ORDER — PROPOFOL 500 MG/50ML IV EMUL
INTRAVENOUS | Status: DC | PRN
  Administered 2022-02-08: 75 ug/kg/min via INTRAVENOUS

## 2022-02-08 MED ORDER — LIDOCAINE 2% (20 MG/ML) 5 ML SYRINGE
INTRAMUSCULAR | Status: AC
  Filled 2022-02-08: qty 5

## 2022-02-08 MED ORDER — LIDOCAINE-EPINEPHRINE (PF) 1 %-1:200000 IJ SOLN
INTRAMUSCULAR | Status: AC
  Filled 2022-02-08: qty 30

## 2022-02-08 MED ORDER — LIDOCAINE-EPINEPHRINE (PF) 1 %-1:200000 IJ SOLN
INTRAMUSCULAR | Status: DC | PRN
  Administered 2022-02-08: 20 mL

## 2022-02-08 MED ORDER — LIDOCAINE 2% (20 MG/ML) 5 ML SYRINGE
INTRAMUSCULAR | Status: DC | PRN
  Administered 2022-02-08: 80 mg via INTRAVENOUS

## 2022-02-08 MED ORDER — BLOOD GLUCOSE METER KIT: PACK | 0 refills | Status: AC

## 2022-02-08 MED ORDER — CHLORHEXIDINE GLUCONATE 0.12 % MT SOLN
15.0000 mL | Freq: Once | OROMUCOSAL | Status: AC
  Administered 2022-02-08: 15 mL via OROMUCOSAL
  Filled 2022-02-08: qty 15

## 2022-02-08 MED ORDER — FENTANYL CITRATE (PF) 100 MCG/2ML IJ SOLN
INTRAMUSCULAR | Status: AC
  Administered 2022-02-08: 50 ug
  Filled 2022-02-08: qty 2

## 2022-02-08 MED ORDER — PROPOFOL 10 MG/ML IV BOLUS
INTRAVENOUS | Status: DC | PRN
  Administered 2022-02-08: 20 mg via INTRAVENOUS
  Administered 2022-02-08: 10 mg via INTRAVENOUS
  Administered 2022-02-08: 20 mg via INTRAVENOUS

## 2022-02-08 MED ORDER — ACETAMINOPHEN 10 MG/ML IV SOLN: 1000.0000 mg | Freq: Once | INTRAVENOUS | Status: DC | PRN

## 2022-02-08 MED ORDER — ORAL CARE MOUTH RINSE: 15.0000 mL | Freq: Once | OROMUCOSAL | Status: AC

## 2022-02-08 MED ORDER — FENTANYL CITRATE PF 50 MCG/ML IJ SOSY: 50.0000 ug | PREFILLED_SYRINGE | Freq: Once | INTRAMUSCULAR | Status: DC

## 2022-02-08 MED ORDER — MEPIVACAINE HCL (PF) 2 % IJ SOLN
INTRAMUSCULAR | Status: DC | PRN
  Administered 2022-02-08: 20 mL

## 2022-02-08 MED ORDER — CEFAZOLIN SODIUM-DEXTROSE 2-4 GM/100ML-% IV SOLN
2.0000 g | INTRAVENOUS | Status: AC
  Administered 2022-02-08: 2 g via INTRAVENOUS
  Filled 2022-02-08: qty 100

## 2022-02-08 MED ORDER — INSULIN ASPART 100 UNIT/ML IJ SOLN: 0.0000 [IU] | INTRAMUSCULAR | Status: DC | PRN

## 2022-02-08 MED ORDER — HEMOSTATIC AGENTS (NO CHARGE) OPTIME
TOPICAL | Status: DC | PRN
  Administered 2022-02-08: 1 via TOPICAL

## 2022-02-08 MED ORDER — HEPARIN SODIUM (PORCINE) 1000 UNIT/ML IJ SOLN
INTRAMUSCULAR | Status: DC | PRN
  Administered 2022-02-08: 5000 [IU] via INTRAVENOUS

## 2022-02-08 MED ORDER — HEPARIN SODIUM (PORCINE) 1000 UNIT/ML IJ SOLN
INTRAMUSCULAR | Status: AC
  Filled 2022-02-08: qty 10

## 2022-02-08 MED ORDER — FENTANYL CITRATE (PF) 100 MCG/2ML IJ SOLN: 25.0000 ug | INTRAMUSCULAR | Status: DC | PRN

## 2022-02-08 SURGICAL SUPPLY — 45 items
ADH SKN CLS APL DERMABOND .7 (GAUZE/BANDAGES/DRESSINGS) ×1
AGENT HMST 10 BLLW SHRT CANN (HEMOSTASIS) ×1
ARMBAND PINK RESTRICT EXTREMIT (MISCELLANEOUS) ×2 IMPLANT
BAG COUNTER SPONGE SURGICOUNT (BAG) ×2 IMPLANT
BAG SPNG CNTER NS LX DISP (BAG) ×1
CANISTER SUCT 3000ML PPV (MISCELLANEOUS) ×2 IMPLANT
CANNULA VESSEL 3MM 2 BLNT TIP (CANNULA) ×2 IMPLANT
CLIP VESOCCLUDE MED 24/CT (CLIP) ×2 IMPLANT
CLIP VESOCCLUDE SM WIDE 24/CT (CLIP) ×2 IMPLANT
COVER PROBE W GEL 5X96 (DRAPES) ×1 IMPLANT
DERMABOND ADVANCED (GAUZE/BANDAGES/DRESSINGS) ×1
DERMABOND ADVANCED .7 DNX12 (GAUZE/BANDAGES/DRESSINGS) ×1 IMPLANT
ELECT REM PT RETURN 9FT ADLT (ELECTROSURGICAL) ×2
ELECTRODE REM PT RTRN 9FT ADLT (ELECTROSURGICAL) ×1 IMPLANT
GAUZE 4X4 16PLY ~~LOC~~+RFID DBL (SPONGE) ×1 IMPLANT
GLOVE BIO SURGEON STRL SZ7.5 (GLOVE) ×2 IMPLANT
GLOVE BIOGEL PI IND STRL 6.5 (GLOVE) IMPLANT
GLOVE BIOGEL PI IND STRL 8 (GLOVE) ×1 IMPLANT
GLOVE BIOGEL PI INDICATOR 6.5 (GLOVE) ×3
GLOVE BIOGEL PI INDICATOR 8 (GLOVE) ×1
GLOVE SURG POLY ORTHO LF SZ7.5 (GLOVE) IMPLANT
GLOVE SURG SS PI 6.5 STRL IVOR (GLOVE) ×1 IMPLANT
GLOVE SURG UNDER LTX SZ8 (GLOVE) ×2 IMPLANT
GOWN STRL REUS W/ TWL LRG LVL3 (GOWN DISPOSABLE) ×3 IMPLANT
GOWN STRL REUS W/TWL LRG LVL3 (GOWN DISPOSABLE) ×8
GRAFT GORETEX STRT 4-7X45 (Vascular Products) ×1 IMPLANT
HEMOSTAT HEMOBLAST BELLOWS (HEMOSTASIS) ×1 IMPLANT
KIT BASIN OR (CUSTOM PROCEDURE TRAY) ×2 IMPLANT
KIT TURNOVER KIT B (KITS) ×2 IMPLANT
LOOP VESSEL MAXI BLUE 1X16 (MISCELLANEOUS) ×1 IMPLANT
NS IRRIG 1000ML POUR BTL (IV SOLUTION) ×2 IMPLANT
PACK CV ACCESS (CUSTOM PROCEDURE TRAY) ×2 IMPLANT
PAD ARMBOARD 7.5X6 YLW CONV (MISCELLANEOUS) ×4 IMPLANT
SLING ARM FOAM STRAP LRG (SOFTGOODS) IMPLANT
SLING ARM FOAM STRAP MED (SOFTGOODS) ×1 IMPLANT
SPIKE FLUID TRANSFER (MISCELLANEOUS) ×2 IMPLANT
SPONGE SURGIFOAM ABS GEL 100 (HEMOSTASIS) IMPLANT
SUT MNCRL AB 4-0 PS2 18 (SUTURE) ×5 IMPLANT
SUT PROLENE 6 0 BV (SUTURE) ×9 IMPLANT
SUT SILK 2 0 SH (SUTURE) ×1 IMPLANT
SUT VIC AB 3-0 SH 27 (SUTURE) ×6
SUT VIC AB 3-0 SH 27X BRD (SUTURE) ×2 IMPLANT
TOWEL GREEN STERILE (TOWEL DISPOSABLE) ×2 IMPLANT
UNDERPAD 30X36 HEAVY ABSORB (UNDERPADS AND DIAPERS) ×2 IMPLANT
WATER STERILE IRR 1000ML POUR (IV SOLUTION) ×2 IMPLANT

## 2022-02-08 NOTE — Patient Instructions (Addendum)
Contact nephrology about bowel regimen and use of miralax, sorbitol to prevent constipation from worsening.  Same dose of lantus for now. I will contact you about the mealtime doing options, or insulin to use when eating ice cream or other foods that tend to spike his blood sugar.  Continuous glucose monitor data will be helpful, including from the new meter that was sent.  Please forward me some of those results every week or so if possible for now. Recheck next 5 to 6 weeks, sooner if needed. Great talking to you today, take care and please let me know if there are questions in the meantime.

## 2022-02-08 NOTE — Anesthesia Procedure Notes (Signed)
Anesthesia Regional Block: Supraclavicular block   Pre-Anesthetic Checklist: , timeout performed,  Correct Patient, Correct Site, Correct Laterality,  Correct Procedure, Correct Position, site marked,  Risks and benefits discussed,  Surgical consent,  Pre-op evaluation,  At surgeon's request and post-op pain management  Laterality: Right  Prep: Dura Prep       Needles:  Injection technique: Single-shot  Needle Type: Echogenic Stimulator Needle     Needle Length: 5cm  Needle Gauge: 20     Additional Needles:   Procedures:,,,, ultrasound used (permanent image in chart),,    Narrative:  Start time: 02/08/2022 9:03 AM End time: 02/08/2022 9:08 AM Injection made incrementally with aspirations every 5 mL.  Performed by: Personally  Anesthesiologist: Darral Dash, DO  Additional Notes: Patient identified. Risks/Benefits/Options discussed with patient including but not limited to bleeding, infection, nerve damage, failed block, incomplete pain control. Patient expressed understanding and wished to proceed. All questions were answered. Sterile technique was used throughout the entire procedure. Please see nursing notes for vital signs. Aspirated in 5cc intervals with injection for negative confirmation. Patient was given instructions on fall risk and not to get out of bed. All questions and concerns addressed with instructions to call with any issues or inadequate analgesia.

## 2022-02-08 NOTE — Op Note (Signed)
    NAME: [name]    MRN: [id number] DOB: 23-Nov-1945    DATE OF OPERATION: 02/08/2022  PREOP DIAGNOSIS:    Stage V chronic kidney disease  POSTOP DIAGNOSIS:    Same  PROCEDURE:    New right upper arm AV graft (4-7 mm PTFE)  SURGEON: Judeth Cornfield. Scot Dock, MD  ASSIST: None  ANESTHESIA: Block  EBL: Minimal  INDICATIONS:    Adam Reid is a 76 y.o. male who had a failed right brachiocephalic fistula.  He presents for new access.  FINDINGS:   The basilic vein I did not think was adequate for a fistula therefore he had a right upper arm graft.  TECHNIQUE:   The patient was taken to the operating room after a block was placed by anesthesia.  The right arm was prepped and draped in usual sterile fashion.  I looked at the basilic vein myself with the SonoSite and it looked reasonable in size.  The right arm was prepped and draped in usual sterile fashion.  Longitudinal incision was made over the basilic vein and very quickly the vein spasmed and became thickened.  Given that the upper arm fistula had failed fairly quickly I felt that this would not be a viable option.  I therefore elected to place the graft.  The veins of the antecubital level were small so I placed an upper arm graft.  An incision was made over the previous anastomosis and here the fistula was dissected free and ligated distally.  The brachial artery proximal and distal to the anastomosis were dissected free and controlled.  Separate longitudinal incision was made beneath the axilla and here the brought high brachial vein was dissected free.  A 4-7 mm PTFE graft was then tunneled between the 2 incisions and the patient was heparinized.  The brachial artery was clamped proximally and distally and the old fistula was taken off of the artery.  There was marked intimal hyperplasia at the proximal anastomosis.  A segment of the graft was excised the graft slightly spatulated and sewn into side to the artery using  continuous 6-0 Prolene suture.  The graft was then pulled the appropriate length for anastomosis to the high brachial vein.  The vein was ligated distally and spatulated proximally.  The graft was cut to the appropriate length spatulated and sewn into into the vein using continuous 6-0 Prolene suture.  At the completion there was a good thrill in the fistula and a palpable radial pulse.  Hemostasis was obtained in the wounds and the heparin was partially reversed with protamine.  The wounds were each closed with a deep layer of 3-0 Vicryl and the skin closed with 4-0 Monocryl.  Dermabond was applied.  The patient tolerated the procedure well was transferred to the recovery room in stable condition.  All needle and sponge counts were correct.  Adam Mayo, MD, FACS [facility]  DATE OF DICTATION:   02/08/2022

## 2022-02-08 NOTE — Transfer of Care (Signed)
Immediate Anesthesia Transfer of Care Note  Patient: Haematologist  Procedure(s) Performed: RIGHT UPPER ARM GORE-TEX GRAFT INSERTION USING 4-25m STRETCH GORETEX GRAFT (Right)  Patient Location: PACU  Anesthesia Type:MAC  Level of Consciousness: awake, alert  and oriented  Airway & Oxygen Therapy: Patient connected to face mask oxygen  Post-op Assessment: Post -op Vital signs reviewed and stable  Post vital signs: stable  Last Vitals:  Vitals Value Taken Time  BP    Temp    Pulse 71 02/08/22 1146  Resp 15 02/08/22 1146  SpO2 96 % 02/08/22 1146  Vitals shown include unvalidated device data.  Last Pain:  Vitals:   02/08/22 0736  TempSrc:   PainSc: 0-No pain         Complications: No notable events documented.

## 2022-02-08 NOTE — Progress Notes (Unsigned)
Virtual Visit via Video Note  I connected with Adam Reid on 02/08/22 at 1448 PM by a video enabled telemedicine application and verified that I am speaking with the correct person using two identifiers.  Patient location: with son Adam Reid, and spouse Adam Reid, also translating. Consent obtained. At home.  My location: office - [place].   I discussed the limitations, risks, security and privacy concerns of performing an evaluation and management service by telephone and the availability of in person appointments. I also discussed with the patient that there may be a patient responsible charge related to this service. The patient expressed understanding and agreed to proceed, consent obtained  Chief complaint:  Chief Complaint  Patient presents with   Hospitalization Follow-up    Son Adam Reid present today -Family stated lantus and they are asking for a IR insulin as well as the XR, requesting different meter be sent in, would like the freestyle libre 2 was given this in the hospital and they want to continue this.      History of Present Illness: Adam Reid is a 76 y.o. male  Follow-up from multiple recent hospitalizations.  Chronic kidney disease, stage IV, now noted V. No current dialysis and has not yet started HD, plan to defer as long as possible.   Brachiocephalic fistula placed in February.  Nephrologist Dr. [name], vascular surgeon [name]. Poorly functioning right upper arm access, fistulogram July 21.  Failed right brachiocephalic fistula, new access placed today with right upper arm AV graft. Recent visit 6/29 at nephrology with Adam Reid, PAC.   Lab Results  Component Value Date   CREATININE 5.00 (H) 02/08/2022  4.45 on 7/20, 5.21 on 7.5, 5.16 on 6/21.  Family aware of reasons for ER eval.    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  Eval at [facility] 7/19, Dr. Emeterio Reeve.  Note reviewed.   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  Intermittently associated adjacent structures including left renal artery, abdominal aorta, superior mesenteric artery, left adrenal gland and multiple small bowel loops.  There is no evidence of metastatic disease otherwise.  Plasma and urine metanephrines were both elevated.  Started on doxazosin.  Surgical oncology eval 01/07/2022, not thought to be surgical candidate given his extensive medical comorbidities.  MIBG scan 01/20/2022 showed uptake in known tumor but no other sites of uptake.  Intermittent bouts of hypertension improved with start of doxazosin.  Discussion of possible Azedra given positive MIBG scan.  Referred to [facility] nuclear medicine  Plan for repeat CT chest, abdomen, pelvis in 6 weeks to review bivalent mass in 6 weeks to reevaluate mass.  Plan for call from [place] soon. Doing well on doxazosin. Episodic elevated BP elevations and HR BP remaining lower - 110/60. No tachycardia. Bp up to 140/70 off doxazosin today prior to surgery. No spikes.  Constipation ER visit July 20.  CT showed constipation without complication at that time.  Fleet enema with successful bowel movement.  Fleet enemas were ordered for home.  Pretreated with MiraLAX, sorbitol, suppositories.  No evidence of obstruction on CT per 7/19 note with oncology.  Has been recommended to use sorbitol, prior lactulose. A little bit better - BM's few times per week. Current treatment - miralax once per day.   Diabetes: With chronic kidney disease.  Previously treated with glipizide, januvia.  Slowly adjusted medications previously given history of CKD and risk of hypoglycemia.  Additionally was referred to endocrinology for assistance  with glycemic control as well as his retroperitoneal paraganglioma.   Has hospice checking on him every few weeks, but in palliative care only, monitoring for now.  Has not been to see endocrinology.  Now that paraganglioma under control son feels  like diabetes may be more stable  Glipizide discontinued during his June hospitalization, transitioned to insulin -Lantus 5 units daily. - now up to 10 units per day past 2-3 weeks.  Home readings fasting: 105 lowest, 150-170 fasting usually.  200's after meals or later at night at times. 200-250.  Postprandial: none  Sx lows: none in past few weeks. Lowest 106.   2 meals per day - breakfast - few eggs, small amt cereal Snacking during day - fruit, juice, ice cream here and there.  Dinner at [employer] - small portion at dinner, 10-15 protein and some carbs - under 100g carbs.  Requesting CGM - freestyle libre 2. Tried before and did well. Has app on phone.  Has glucometer.   Lab Results  Component Value Date   HGBA1C 7.4 (H) 11/23/2021   HGBA1C 7.9 (H) 08/27/2021   HGBA1C 6.0 (A) 12/22/2020   Lab Results  Component Value Date   MICROALBUR 60.3 09/02/2015   LDLCALC 48 12/22/2020   CREATININE 5.00 (H) 02/08/2022     Patient Active Problem List   Diagnosis Date Noted   Chronic respiratory failure with hypoxia ([place]) 12/30/2021   Anemia    Type 2 diabetes mellitus (HCC)    Atrial flutter (HCC)    Protein-calorie malnutrition, moderate (HCC)    Paraganglioma (HCC)    Abdominal mass 11/17/2021   CAP (community acquired pneumonia) 11/16/2021   Thyroid disease    Diabetes mellitus without complication ([place])    Chronic kidney disease    Paroxysmal atrial fibrillation (HCC)    Protein-calorie malnutrition, severe 11/13/2021   Iron deficiency anemia 09/16/2021   Acute on chronic diastolic CHF (congestive heart failure) ([place]) 09/14/2021   COVID-19 virus infection 09/14/2021   Acute renal failure superimposed on chronic kidney disease ([place]) 09/14/2021   Chronic kidney disease (CKD), stage IV (severe) ([place]) 06/09/2021   Symptomatic anemia 12/23/2020   GI bleed 12/23/2020   Paroxysmal A-fib (HCC) 12/23/2020   Chronic diastolic CHF (congestive heart failure) ([place]) 12/23/2020   Acute CHF  (congestive heart failure) ([place]) 09/24/2019   ARF (acute renal failure) ([place]) 09/24/2019   Anemia of chronic disease 09/24/2019   Acute respiratory failure with hypoxia ([place])    Secondary hyperparathyroidism, 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   Smoker 03/26/2016   Hypotension 01/29/2016   Abnormal myocardial perfusion study 01/29/2016   Depression 12/09/2015   Type 2 diabetes mellitus with hyperlipidemia (HCC)    HTN (hypertension) 06/24/2014   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/24/2014   BPH (benign prostatic hyperplasia) 06/24/2014   Past Medical History:  Diagnosis Date   Anemia    Anxiety    BPH (benign prostatic hyperplasia)    Cancer (HCC)    CHF (congestive heart failure) ([place])    Chronic kidney disease    Depression    Diabetes mellitus without complication (HCC)    Type II   Dyspnea    ESRD (end stage renal disease) (HCC)    GERD (gastroesophageal reflux disease)    Heart murmur    History of blood transfusion    Hyperlipidemia    Hypertension    PAF (paroxysmal atrial fibrillation) ([place])    Thyroid disease    was  on supplement, taken off by [name]   Past Surgical History:  Procedure Laterality Date   A/V FISTULAGRAM Right 01/29/2022   Procedure: A/V Fistulagram;  Surgeon: [name], MD;  Location: [place] [facility];  Service: Cardiovascular;  Laterality: Right;   APPENDECTOMY     AV FISTULA PLACEMENT Right 09/04/2021   Procedure: RIGHT BRACHIOCEPHALIC ARTERIOVENOUS (AV) FISTULA CREATION;  Surgeon: [name], MD;  Location: [place];  Service: Vascular;  Laterality: Right;   BIOPSY  12/25/2020   Procedure: BIOPSY;  Surgeon: [name], MD;  Location: [place];  Service: Gastroenterology;;   CARDIAC CATHETERIZATION N/A 01/30/2016   Procedure: Left Heart Cath and Coronary Angiography;  Surgeon: [name], MD;  Location: [place] [facility];  Service: Cardiovascular;  Laterality: N/A;    ENTEROSCOPY N/A 12/25/2020   Procedure: ENTEROSCOPY;  Surgeon: [name], MD;  Location: [place];  Service: Gastroenterology;  Laterality: N/A;   GIVENS CAPSULE STUDY N/A 12/25/2020   Procedure: GIVENS CAPSULE STUDY;  Surgeon: [name]., MD;  Location: [place];  Service: Gastroenterology;  Laterality: N/A;   SUBMUCOSAL TATTOO INJECTION  12/25/2020   Procedure: SUBMUCOSAL TATTOO INJECTION;  Surgeon: [name], MD;  Location: [place];  Service: Gastroenterology;;   Allergies  Allergen Reactions   Pork-Derived Products Other (See Comments)    Patient "just does not eat this"   Prior to Admission medications   Medication Sig Start Date End Date Taking? Authorizing Provider  amiodarone (PACERONE) 200 MG tablet Take 1 tablet (200 mg total) by mouth daily. Follow up appt required for further refills. Please call [phone] to schedule an appt Patient taking differently: Take 200 mg by mouth daily. 11/30/21   Clegg, Amy D, NP  atorvastatin (LIPITOR) 80 MG tablet TAKE 1 TABLET(80 MG) BY MOUTH DAILY Patient taking differently: Take 80 mg by mouth daily. 12/22/20   [name], MD  bisacodyl (DULCOLAX) 10 MG suppository Place 1 suppository (10 mg total) rectally as needed for moderate constipation. 12/31/21   [name], MD  blood glucose meter kit and supplies Use up to two times daily as directed  ICD10 E10.9 E11.9 01/18/22   [name], MD  blood glucose meter kit and supplies Use up to two times daily as directed  ICD10 E10.9 E11.9 01/18/22   [name], MD  CALPHRON 667 MG tablet Take 1,334 mg by mouth 3 (three) times daily with meals. 11/09/21   [provider]  Cholecalciferol (VITAMIN D3 PO) Take 1 tablet by mouth daily.    [provider]  diltiazem (CARDIZEM CD) 180 MG 24 hr capsule TAKE 1 CAPSULE(180 MG) BY MOUTH DAILY Patient taking differently: Take 180 mg by mouth daily. 08/24/21   Bensimhon,  Shaune Pascal, MD  doxazosin (CARDURA) 4 MG tablet Take 1 tablet (4 mg total) by mouth daily. 12/24/21 01/23/22  Shahmehdi, Valeria Batman, MD  ELIQUIS 2.5 MG TABS tablet TAKE 1 TABLET(2.5 MG) BY MOUTH TWICE DAILY Patient taking differently: Take 2.5 mg by mouth 2 (two) times daily. 10/30/21   Bensimhon, Shaune Pascal, MD  GlucoCom Lancets MISC Use for home glucose monitoring 09/02/15   Harrison Mons, PA  glucose blood (ONETOUCH VERIO) test strip Test up to 2 times per day.  Uncontrolled diabetes with hyperglycemia and stage 4 CKD. 04/13/19   [name], MD  HYDROmorphone (DILAUDID) 2 MG tablet Take 0.5 tablets (1 mg total) by mouth every 12 (twelve) hours as needed  for moderate pain. 12/31/21   [name], MD  insulin glargine (LANTUS) 100 UNIT/ML Solostar Pen Inject 5 Units into the skin daily. Patient taking differently: Inject 5 Units into the skin every morning. 01/26/22   [name], MD  Insulin Pen Needle 31G X 5 MM MISC Use as directed 12/31/21   [name], MD  lactulose (CHRONULAC) 10 GM/15ML solution Take 15 mLs (10 g total) by mouth 2 (two) times daily. Patient taking differently: Take 10 g by mouth 2 (two) times daily as needed for moderate constipation. 12/31/21   [name], MD  oxyCODONE-acetaminophen (PERCOCET) 5-325 MG tablet Take 1 tablet by mouth every 4 (four) hours as needed for severe pain. 02/08/22 02/08/23  [name], MD  OXYGEN Inhale 2.5 L into the lungs continuous.    [provider]  sitaGLIPtin (JANUVIA) 25 MG tablet TAKE 1 TABLET(25 MG) BY MOUTH DAILY Patient taking differently: Take 25 mg by mouth daily. 08/31/21   [name], MD  torsemide (DEMADEX) 20 MG tablet Take 2 tablets (40 mg total) by mouth 2 (two) times daily. 10/05/21   [place]ford, Maricela Bo, FNP  vitamin B-12 (CYANOCOBALAMIN) 500 MCG tablet Take 500 mcg by mouth daily.    [provider]   Social History   Socioeconomic History   Marital status: Married     Spouse name: [name]   Number of children: 1   Years of education: 12th grade   Highest education level: Not on file  Occupational History   Occupation: Retired-accountant  Tobacco Use   Smoking status: Some Days    Packs/day: 0.10    Years: 48.00    Total pack years: 4.80    Types: Cigarettes    Last attempt to quit: 07/16/2019    Years since quitting: 2.5   Smokeless tobacco: Never   Tobacco comments:    09/03/21 Wife states, "he hides from me, so I am not sure how much he smokes. " Wife says he smokes occassionally 4 cigs a day."  Vaping Use   Vaping Use: Never used  Substance and Sexual Activity   Alcohol use: No    Alcohol/week: 0.0 standard drinks of alcohol   Drug use: No   Sexual activity: Never    Partners: Female  Other Topics Concern   Not on file  Social History Narrative   Originally from [place]. Came to the [place] in 2009, following their son who came here for school.   Married.   Lives with his wife.   Their adult son lives in [place], [place], where he is in optometry school.   Education: [employer]   Exercise: No   Social Determinants of Radio broadcast assistant Strain: Not on file  Food Insecurity: Not on file  Transportation Needs: Not on file  Physical Activity: Not on file  Stress: Not on file  Social Connections: Not on file  Intimate Partner Violence: Not on file    Observations/Objective: Vitals:   02/08/22 1444  Temp: 98.6 F (37 C)  TempSrc: Oral  Weight: 115 lb (52.2 kg)  Initial portion of the video visit, patient alert, responding appropriately to questions.  Nontoxic-appearing, no respiratory distress, but did appear fatigued, rested for latter portion of visit.   Assessment and Plan: Chronic kidney disease (CKD), stage IV (severe) ([place]) - Plan: Continuous Blood Gluc Receiver (FREESTYLE LIBRE 2 READER) DEVI, blood glucose meter kit and supplies Type 2 diabetes mellitus with chronic kidney disease, without  long-term current use of  insulin, unspecified CKD stage ([place]) - Plan: Continuous Blood Gluc Receiver (FREESTYLE LIBRE 2 READER) DEVI, blood glucose meter kit and supplies  -Not yet undergoing dialysis, reasons for have been discussed with patient, family and they are aware, close follow-up with nephrology.  Access placed as above should be ready for use in the next few weeks.  -Variable level of glycemic control, now on just long-acting insulin.  Would consider low-dose of short acting insulin to keep him from significant hyperglycemia but cautioned about hypoglycemia and some difficulty given his CKD, variable intake.  CGM ordered for closer monitoring of readings and can adjust doses of meds accordingly.  Continue 10 units of Lantus for now given readings in the low 100s as above.  Once I review some of his home readings can decide on potential mealtime insulin depending on his intake.  We will discuss with family - supportive family.   Paraganglioma [facility])  -Follow-up planned with [facility] for potential treatment.  Constipation, unspecified constipation type  -Active management discussed to minimize risk of worsening, or fecal impaction.  Given the severity of his CKD recommended discussion of specific agents and regimen with his nephrologist.  Follow Up Instructions:  5-week follow-up, will review continuous glucose monitor results in the meantime.  Patient Instructions  Contact nephrology about bowel regimen and use of miralax, sorbitol to prevent constipation from worsening.  Same dose of lantus for now. I will contact you about the mealtime doing options, or insulin to use when eating ice cream or other foods that tend to spike his blood sugar.  Continuous glucose monitor data will be helpful, including from the new meter that was sent.  Please forward me some of those results every week or so if possible for now. Recheck next 5 to 6 weeks, sooner if needed. Great talking to you today, take care and please let me know if  there are questions in the meantime.   I discussed the assessment and treatment plan with the patient. The patient was provided an opportunity to ask questions and all were answered. The patient agreed with the plan and demonstrated an understanding of the instructions.   The patient was advised to call back or seek an in-person evaluation if the symptoms worsen or if the condition fails to improve as anticipated.   [name], MD

## 2022-02-08 NOTE — Anesthesia Postprocedure Evaluation (Signed)
Anesthesia Post Note  Patient: Haematologist  Procedure(s) Performed: RIGHT UPPER ARM GORE-TEX GRAFT INSERTION USING 4-1m STRETCH GORETEX GRAFT (Right)     Patient location during evaluation: PACU Anesthesia Type: Regional and MAC Level of consciousness: awake and alert Pain management: pain level controlled Vital Signs Assessment: post-procedure vital signs reviewed and stable Respiratory status: spontaneous breathing, nonlabored ventilation, respiratory function stable and patient connected to nasal cannula oxygen Cardiovascular status: stable and blood pressure returned to baseline Postop Assessment: no apparent nausea or vomiting Anesthetic complications: no   No notable events documented.  Last Vitals:  Vitals:   02/08/22 1205 02/08/22 1220  BP: (!) 115/55 (!) 129/53  Pulse: 72 72  Resp: 20 (!) 21  Temp:  36.4 C  SpO2: 93% 93%    Last Pain:  Vitals:   02/08/22 1220  TempSrc:   PainSc: 0-No pain                 GBelenda CruiseP Zanyia Silbaugh

## 2022-02-08 NOTE — Interval H&P Note (Signed)
History and Physical Interval Note:  02/08/2022 7:07 AM  Adam Reid  has presented today for surgery, with the diagnosis of Malfunction of arteriovenous fistula, CKD IV.  The various methods of treatment have been discussed with the patient and family. After consideration of risks, benefits and other options for treatment, the patient has consented to  Procedure(s): RIGHT BASILIC VEIN TRANSPOSITION VERSUS ARTERIOVENOUS GRAFT CREATION (Right) as a surgical intervention.  The patient's history has been reviewed, patient examined, no change in status, stable for surgery.  I have reviewed the patient's chart and labs.  Questions were answered to the patient's satisfaction.     [name]

## 2022-02-08 NOTE — Interval H&P Note (Signed)
History and Physical Interval Note:  02/08/2022 7:06 AM  Adam Reid  has presented today for surgery, with the diagnosis of Malfunction of arteriovenous fistula, CKD IV.  The various methods of treatment have been discussed with the patient and family. After consideration of risks, benefits and other options for treatment, the patient has consented to  Procedure(s): RIGHT BASILIC VEIN TRANSPOSITION VERSUS ARTERIOVENOUS GRAFT CREATION (Right) as a surgical intervention.  The patient's history has been reviewed, patient examined, no change in status, stable for surgery.  I have reviewed the patient's chart and labs.  Questions were answered to the patient's satisfaction.     [name]

## 2022-02-08 NOTE — Progress Notes (Signed)
Wannetta Sender, RN pulled 19mg/2ml of fentanyl IV out of Pyxis for block. Only 584m given at bedside per Dr. StGloris ManchesterWhen RN went to waste the additional 5092mof fentanyl under the original medication pulled, it was wasted under another Fentanyl order pulled by AliJunie BameRNA. CRNA notified RN of incorrect waste.  RN wasted correct amount of fentanyl under the correct ordered dose. AliJunie BameRNA made aware of correct waste.  TraOlivia Mackieith main pharmacy also made aware of incorrect waste.

## 2022-02-08 NOTE — Anesthesia Procedure Notes (Signed)
Procedure Name: MAC Date/Time: 02/08/2022 9:28 AM  Performed by: Rande Brunt, CRNAPre-anesthesia Checklist: Patient identified, Emergency Drugs available, Suction available and Patient being monitored Patient Re-evaluated:Patient Re-evaluated prior to induction Oxygen Delivery Method: Simple face mask Preoxygenation: Pre-oxygenation with 100% oxygen Induction Type: IV induction Placement Confirmation: positive ETCO2 and CO2 detector Dental Injury: Teeth and Oropharynx as per pre-operative assessment

## 2022-02-09 ENCOUNTER — Encounter (HOSPITAL_COMMUNITY): Payer: Self-pay | Admitting: Vascular Surgery

## 2022-02-09 DIAGNOSIS — I4892 Unspecified atrial flutter: Secondary | ICD-10-CM | POA: Diagnosis not present

## 2022-02-09 DIAGNOSIS — I132 Hypertensive heart and chronic kidney disease with heart failure and with stage 5 chronic kidney disease, or end stage renal disease: Secondary | ICD-10-CM | POA: Diagnosis not present

## 2022-02-09 DIAGNOSIS — N186 End stage renal disease: Secondary | ICD-10-CM | POA: Diagnosis not present

## 2022-02-09 DIAGNOSIS — I480 Paroxysmal atrial fibrillation: Secondary | ICD-10-CM | POA: Diagnosis not present

## 2022-02-09 DIAGNOSIS — I2510 Atherosclerotic heart disease of native coronary artery without angina pectoris: Secondary | ICD-10-CM | POA: Diagnosis not present

## 2022-02-09 DIAGNOSIS — Z7984 Long term (current) use of oral hypoglycemic drugs: Secondary | ICD-10-CM | POA: Diagnosis not present

## 2022-02-09 DIAGNOSIS — Z794 Long term (current) use of insulin: Secondary | ICD-10-CM | POA: Diagnosis not present

## 2022-02-09 DIAGNOSIS — D483 Neoplasm of uncertain behavior of retroperitoneum: Secondary | ICD-10-CM | POA: Diagnosis not present

## 2022-02-09 DIAGNOSIS — E785 Hyperlipidemia, unspecified: Secondary | ICD-10-CM | POA: Diagnosis not present

## 2022-02-09 DIAGNOSIS — D509 Iron deficiency anemia, unspecified: Secondary | ICD-10-CM | POA: Diagnosis not present

## 2022-02-09 DIAGNOSIS — I5033 Acute on chronic diastolic (congestive) heart failure: Secondary | ICD-10-CM | POA: Diagnosis not present

## 2022-02-09 DIAGNOSIS — D631 Anemia in chronic kidney disease: Secondary | ICD-10-CM | POA: Diagnosis not present

## 2022-02-09 DIAGNOSIS — F32A Depression, unspecified: Secondary | ICD-10-CM | POA: Diagnosis not present

## 2022-02-09 DIAGNOSIS — K219 Gastro-esophageal reflux disease without esophagitis: Secondary | ICD-10-CM | POA: Diagnosis not present

## 2022-02-09 DIAGNOSIS — J9611 Chronic respiratory failure with hypoxia: Secondary | ICD-10-CM | POA: Diagnosis not present

## 2022-02-09 DIAGNOSIS — D630 Anemia in neoplastic disease: Secondary | ICD-10-CM | POA: Diagnosis not present

## 2022-02-09 DIAGNOSIS — Z7901 Long term (current) use of anticoagulants: Secondary | ICD-10-CM | POA: Diagnosis not present

## 2022-02-09 DIAGNOSIS — E1122 Type 2 diabetes mellitus with diabetic chronic kidney disease: Secondary | ICD-10-CM | POA: Diagnosis not present

## 2022-02-09 DIAGNOSIS — E440 Moderate protein-calorie malnutrition: Secondary | ICD-10-CM | POA: Diagnosis not present

## 2022-02-09 DIAGNOSIS — Z466 Encounter for fitting and adjustment of urinary device: Secondary | ICD-10-CM | POA: Diagnosis not present

## 2022-02-09 DIAGNOSIS — R3914 Feeling of incomplete bladder emptying: Secondary | ICD-10-CM | POA: Diagnosis not present

## 2022-02-09 DIAGNOSIS — J9811 Atelectasis: Secondary | ICD-10-CM | POA: Diagnosis not present

## 2022-02-09 DIAGNOSIS — N179 Acute kidney failure, unspecified: Secondary | ICD-10-CM | POA: Diagnosis not present

## 2022-02-09 DIAGNOSIS — R338 Other retention of urine: Secondary | ICD-10-CM | POA: Diagnosis not present

## 2022-02-09 DIAGNOSIS — N2581 Secondary hyperparathyroidism of renal origin: Secondary | ICD-10-CM | POA: Diagnosis not present

## 2022-02-10 ENCOUNTER — Emergency Department (HOSPITAL_COMMUNITY): Payer: Medicare Other

## 2022-02-10 ENCOUNTER — Encounter (HOSPITAL_COMMUNITY)
Admission: RE | Admit: 2022-02-10 | Discharge: 2022-02-10 | Disposition: A | Discharge status: Home / Self Care | Payer: Medicare Other | Source: Ambulatory Visit | Attending: Nephrology | Admitting: Nephrology

## 2022-02-10 ENCOUNTER — Inpatient Hospital Stay (HOSPITAL_COMMUNITY)
Admission: EM | Admit: 2022-02-10 | Discharge: 2022-03-12 | DRG: 252 | Disposition: E | Payer: Medicare Other | Attending: Internal Medicine | Admitting: Internal Medicine

## 2022-02-10 ENCOUNTER — Other Ambulatory Visit: Payer: Self-pay

## 2022-02-10 VITALS — BP 130/55 | HR 81 | Temp 98.0°F | Resp 18

## 2022-02-10 DIAGNOSIS — K761 Chronic passive congestion of liver: Secondary | ICD-10-CM

## 2022-02-10 DIAGNOSIS — F419 Anxiety disorder, unspecified: Secondary | ICD-10-CM | POA: Diagnosis present

## 2022-02-10 DIAGNOSIS — N179 Acute kidney failure, unspecified: Secondary | ICD-10-CM

## 2022-02-10 DIAGNOSIS — R6889 Other general symptoms and signs: Secondary | ICD-10-CM | POA: Diagnosis not present

## 2022-02-10 DIAGNOSIS — Z9981 Dependence on supplemental oxygen: Secondary | ICD-10-CM

## 2022-02-10 DIAGNOSIS — E871 Hypo-osmolality and hyponatremia: Secondary | ICD-10-CM | POA: Diagnosis present

## 2022-02-10 DIAGNOSIS — E11649 Type 2 diabetes mellitus with hypoglycemia without coma: Secondary | ICD-10-CM | POA: Diagnosis present

## 2022-02-10 DIAGNOSIS — Z841 Family history of disorders of kidney and ureter: Secondary | ICD-10-CM

## 2022-02-10 DIAGNOSIS — J90 Pleural effusion, not elsewhere classified: Secondary | ICD-10-CM | POA: Diagnosis not present

## 2022-02-10 DIAGNOSIS — R54 Age-related physical debility: Secondary | ICD-10-CM | POA: Diagnosis present

## 2022-02-10 DIAGNOSIS — E1165 Type 2 diabetes mellitus with hyperglycemia: Secondary | ICD-10-CM | POA: Diagnosis not present

## 2022-02-10 DIAGNOSIS — I132 Hypertensive heart and chronic kidney disease with heart failure and with stage 5 chronic kidney disease, or end stage renal disease: Principal | ICD-10-CM | POA: Diagnosis present

## 2022-02-10 DIAGNOSIS — R7989 Other specified abnormal findings of blood chemistry: Secondary | ICD-10-CM

## 2022-02-10 DIAGNOSIS — Z681 Body mass index (BMI) 19 or less, adult: Secondary | ICD-10-CM

## 2022-02-10 DIAGNOSIS — N19 Unspecified kidney failure: Secondary | ICD-10-CM | POA: Diagnosis present

## 2022-02-10 DIAGNOSIS — K7200 Acute and subacute hepatic failure without coma: Secondary | ICD-10-CM | POA: Diagnosis present

## 2022-02-10 DIAGNOSIS — E8809 Other disorders of plasma-protein metabolism, not elsewhere classified: Secondary | ICD-10-CM | POA: Diagnosis present

## 2022-02-10 DIAGNOSIS — I959 Hypotension, unspecified: Secondary | ICD-10-CM | POA: Diagnosis not present

## 2022-02-10 DIAGNOSIS — R0902 Hypoxemia: Secondary | ICD-10-CM | POA: Diagnosis not present

## 2022-02-10 DIAGNOSIS — G9340 Encephalopathy, unspecified: Secondary | ICD-10-CM | POA: Diagnosis not present

## 2022-02-10 DIAGNOSIS — K920 Hematemesis: Secondary | ICD-10-CM | POA: Diagnosis present

## 2022-02-10 DIAGNOSIS — Z794 Long term (current) use of insulin: Secondary | ICD-10-CM

## 2022-02-10 DIAGNOSIS — Y732 Prosthetic and other implants, materials and accessory gastroenterology and urology devices associated with adverse incidents: Secondary | ICD-10-CM | POA: Diagnosis not present

## 2022-02-10 DIAGNOSIS — G8929 Other chronic pain: Secondary | ICD-10-CM | POA: Diagnosis present

## 2022-02-10 DIAGNOSIS — K2270 Barrett's esophagus without dysplasia: Secondary | ICD-10-CM | POA: Diagnosis present

## 2022-02-10 DIAGNOSIS — R159 Full incontinence of feces: Secondary | ICD-10-CM | POA: Diagnosis not present

## 2022-02-10 DIAGNOSIS — L89156 Pressure-induced deep tissue damage of sacral region: Secondary | ICD-10-CM | POA: Diagnosis present

## 2022-02-10 DIAGNOSIS — M7989 Other specified soft tissue disorders: Secondary | ICD-10-CM | POA: Diagnosis present

## 2022-02-10 DIAGNOSIS — Z86010 Personal history of colonic polyps: Secondary | ICD-10-CM

## 2022-02-10 DIAGNOSIS — A4152 Sepsis due to Pseudomonas: Secondary | ICD-10-CM | POA: Diagnosis not present

## 2022-02-10 DIAGNOSIS — I499 Cardiac arrhythmia, unspecified: Secondary | ICD-10-CM | POA: Diagnosis not present

## 2022-02-10 DIAGNOSIS — K5909 Other constipation: Secondary | ICD-10-CM | POA: Diagnosis present

## 2022-02-10 DIAGNOSIS — Z992 Dependence on renal dialysis: Secondary | ICD-10-CM

## 2022-02-10 DIAGNOSIS — A4150 Gram-negative sepsis, unspecified: Secondary | ICD-10-CM

## 2022-02-10 DIAGNOSIS — E7800 Pure hypercholesterolemia, unspecified: Secondary | ICD-10-CM | POA: Diagnosis present

## 2022-02-10 DIAGNOSIS — M109 Gout, unspecified: Secondary | ICD-10-CM | POA: Diagnosis present

## 2022-02-10 DIAGNOSIS — R001 Bradycardia, unspecified: Secondary | ICD-10-CM | POA: Diagnosis not present

## 2022-02-10 DIAGNOSIS — D447 Neoplasm of uncertain behavior of aortic body and other paraganglia: Secondary | ICD-10-CM | POA: Diagnosis present

## 2022-02-10 DIAGNOSIS — D649 Anemia, unspecified: Secondary | ICD-10-CM | POA: Diagnosis not present

## 2022-02-10 DIAGNOSIS — D61818 Other pancytopenia: Secondary | ICD-10-CM | POA: Diagnosis present

## 2022-02-10 DIAGNOSIS — R6510 Systemic inflammatory response syndrome (SIRS) of non-infectious origin without acute organ dysfunction: Secondary | ICD-10-CM

## 2022-02-10 DIAGNOSIS — N186 End stage renal disease: Secondary | ICD-10-CM

## 2022-02-10 DIAGNOSIS — F32A Depression, unspecified: Secondary | ICD-10-CM | POA: Diagnosis present

## 2022-02-10 DIAGNOSIS — K7290 Hepatic failure, unspecified without coma: Secondary | ICD-10-CM | POA: Diagnosis present

## 2022-02-10 DIAGNOSIS — R188 Other ascites: Secondary | ICD-10-CM | POA: Diagnosis present

## 2022-02-10 DIAGNOSIS — Z85841 Personal history of malignant neoplasm of brain: Secondary | ICD-10-CM

## 2022-02-10 DIAGNOSIS — D62 Acute posthemorrhagic anemia: Secondary | ICD-10-CM | POA: Diagnosis present

## 2022-02-10 DIAGNOSIS — Z515 Encounter for palliative care: Secondary | ICD-10-CM

## 2022-02-10 DIAGNOSIS — I2510 Atherosclerotic heart disease of native coronary artery without angina pectoris: Secondary | ICD-10-CM | POA: Diagnosis present

## 2022-02-10 DIAGNOSIS — Z83438 Family history of other disorder of lipoprotein metabolism and other lipidemia: Secondary | ICD-10-CM

## 2022-02-10 DIAGNOSIS — I700 Atherosclerosis of aorta: Secondary | ICD-10-CM | POA: Diagnosis not present

## 2022-02-10 DIAGNOSIS — R059 Cough, unspecified: Secondary | ICD-10-CM | POA: Diagnosis present

## 2022-02-10 DIAGNOSIS — Z743 Need for continuous supervision: Secondary | ICD-10-CM | POA: Diagnosis not present

## 2022-02-10 DIAGNOSIS — Z7901 Long term (current) use of anticoagulants: Secondary | ICD-10-CM

## 2022-02-10 DIAGNOSIS — E43 Unspecified severe protein-calorie malnutrition: Secondary | ICD-10-CM

## 2022-02-10 DIAGNOSIS — R17 Unspecified jaundice: Secondary | ICD-10-CM

## 2022-02-10 DIAGNOSIS — Z7984 Long term (current) use of oral hypoglycemic drugs: Secondary | ICD-10-CM

## 2022-02-10 DIAGNOSIS — R040 Epistaxis: Secondary | ICD-10-CM | POA: Diagnosis not present

## 2022-02-10 DIAGNOSIS — R64 Cachexia: Secondary | ICD-10-CM | POA: Diagnosis present

## 2022-02-10 DIAGNOSIS — R531 Weakness: Secondary | ICD-10-CM | POA: Diagnosis not present

## 2022-02-10 DIAGNOSIS — T8130XA Disruption of wound, unspecified, initial encounter: Secondary | ICD-10-CM | POA: Diagnosis not present

## 2022-02-10 DIAGNOSIS — R627 Adult failure to thrive: Secondary | ICD-10-CM | POA: Diagnosis present

## 2022-02-10 DIAGNOSIS — N390 Urinary tract infection, site not specified: Secondary | ICD-10-CM | POA: Diagnosis present

## 2022-02-10 DIAGNOSIS — E875 Hyperkalemia: Secondary | ICD-10-CM | POA: Diagnosis present

## 2022-02-10 DIAGNOSIS — J9621 Acute and chronic respiratory failure with hypoxia: Secondary | ICD-10-CM | POA: Diagnosis present

## 2022-02-10 DIAGNOSIS — D519 Vitamin B12 deficiency anemia, unspecified: Secondary | ICD-10-CM

## 2022-02-10 DIAGNOSIS — D684 Acquired coagulation factor deficiency: Secondary | ICD-10-CM | POA: Diagnosis present

## 2022-02-10 DIAGNOSIS — Z8249 Family history of ischemic heart disease and other diseases of the circulatory system: Secondary | ICD-10-CM

## 2022-02-10 DIAGNOSIS — N281 Cyst of kidney, acquired: Secondary | ICD-10-CM | POA: Diagnosis present

## 2022-02-10 DIAGNOSIS — Z9181 History of falling: Secondary | ICD-10-CM

## 2022-02-10 DIAGNOSIS — R0600 Dyspnea, unspecified: Secondary | ICD-10-CM | POA: Diagnosis not present

## 2022-02-10 DIAGNOSIS — N400 Enlarged prostate without lower urinary tract symptoms: Secondary | ICD-10-CM | POA: Diagnosis present

## 2022-02-10 DIAGNOSIS — I5032 Chronic diastolic (congestive) heart failure: Secondary | ICD-10-CM | POA: Diagnosis present

## 2022-02-10 DIAGNOSIS — Z7985 Long-term (current) use of injectable non-insulin antidiabetic drugs: Secondary | ICD-10-CM

## 2022-02-10 DIAGNOSIS — D631 Anemia in chronic kidney disease: Secondary | ICD-10-CM | POA: Diagnosis present

## 2022-02-10 DIAGNOSIS — R748 Abnormal levels of other serum enzymes: Secondary | ICD-10-CM

## 2022-02-10 DIAGNOSIS — T8384XA Pain from genitourinary prosthetic devices, implants and grafts, initial encounter: Secondary | ICD-10-CM | POA: Diagnosis not present

## 2022-02-10 DIAGNOSIS — Z66 Do not resuscitate: Secondary | ICD-10-CM | POA: Diagnosis not present

## 2022-02-10 DIAGNOSIS — K219 Gastro-esophageal reflux disease without esophagitis: Secondary | ICD-10-CM | POA: Diagnosis present

## 2022-02-10 DIAGNOSIS — N189 Chronic kidney disease, unspecified: Secondary | ICD-10-CM

## 2022-02-10 DIAGNOSIS — I10 Essential (primary) hypertension: Secondary | ICD-10-CM | POA: Diagnosis present

## 2022-02-10 DIAGNOSIS — Z79899 Other long term (current) drug therapy: Secondary | ICD-10-CM

## 2022-02-10 DIAGNOSIS — N185 Chronic kidney disease, stage 5: Secondary | ICD-10-CM | POA: Diagnosis not present

## 2022-02-10 DIAGNOSIS — I5033 Acute on chronic diastolic (congestive) heart failure: Secondary | ICD-10-CM | POA: Diagnosis present

## 2022-02-10 DIAGNOSIS — E1122 Type 2 diabetes mellitus with diabetic chronic kidney disease: Secondary | ICD-10-CM | POA: Diagnosis present

## 2022-02-10 DIAGNOSIS — F17210 Nicotine dependence, cigarettes, uncomplicated: Secondary | ICD-10-CM | POA: Diagnosis present

## 2022-02-10 DIAGNOSIS — I4510 Unspecified right bundle-branch block: Secondary | ICD-10-CM | POA: Diagnosis present

## 2022-02-10 DIAGNOSIS — Y832 Surgical operation with anastomosis, bypass or graft as the cause of abnormal reaction of the patient, or of later complication, without mention of misadventure at the time of the procedure: Secondary | ICD-10-CM | POA: Diagnosis present

## 2022-02-10 DIAGNOSIS — I5030 Unspecified diastolic (congestive) heart failure: Secondary | ICD-10-CM | POA: Diagnosis not present

## 2022-02-10 DIAGNOSIS — N250 Renal osteodystrophy: Secondary | ICD-10-CM | POA: Diagnosis present

## 2022-02-10 DIAGNOSIS — I480 Paroxysmal atrial fibrillation: Secondary | ICD-10-CM | POA: Diagnosis present

## 2022-02-10 DIAGNOSIS — T82590A Other mechanical complication of surgically created arteriovenous fistula, initial encounter: Secondary | ICD-10-CM | POA: Diagnosis present

## 2022-02-10 DIAGNOSIS — E872 Acidosis, unspecified: Secondary | ICD-10-CM | POA: Diagnosis present

## 2022-02-10 LAB — CBC WITH DIFFERENTIAL/PLATELET
Abs Immature Granulocytes: 0.05 10*3/uL (ref 0.00–0.07)
Basophils Absolute: 0 10*3/uL (ref 0.0–0.1)
Basophils Relative: 0 %
Eosinophils Absolute: 0 10*3/uL (ref 0.0–0.5)
Eosinophils Relative: 0 %
HCT: 21.5 % — ABNORMAL LOW (ref 39.0–52.0)
Hemoglobin: 6.8 g/dL — CL (ref 13.0–17.0)
Immature Granulocytes: 1 %
Lymphocytes Relative: 5 %
Lymphs Abs: 0.3 10*3/uL — ABNORMAL LOW (ref 0.7–4.0)
MCH: 29.2 pg (ref 26.0–34.0)
MCHC: 31.6 g/dL (ref 30.0–36.0)
MCV: 92.3 fL (ref 80.0–100.0)
Monocytes Absolute: 0.4 10*3/uL (ref 0.1–1.0)
Monocytes Relative: 9 %
Neutro Abs: 4.4 10*3/uL (ref 1.7–7.7)
Neutrophils Relative %: 85 %
Platelets: 170 10*3/uL (ref 150–400)
RBC: 2.33 MIL/uL — ABNORMAL LOW (ref 4.22–5.81)
RDW: 16.1 % — ABNORMAL HIGH (ref 11.5–15.5)
WBC: 5.1 10*3/uL (ref 4.0–10.5)
nRBC: 0 % (ref 0.0–0.2)

## 2022-02-10 LAB — I-STAT VENOUS BLOOD GAS, ED
Acid-base deficit: 8 mmol/L — ABNORMAL HIGH (ref 0.0–2.0)
Bicarbonate: 18.6 mmol/L — ABNORMAL LOW (ref 20.0–28.0)
Calcium, Ion: 1.02 mmol/L — ABNORMAL LOW (ref 1.15–1.40)
HCT: 19 % — ABNORMAL LOW (ref 39.0–52.0)
Hemoglobin: 6.5 g/dL — CL (ref 13.0–17.0)
O2 Saturation: 89 %
Potassium: 6.6 mmol/L (ref 3.5–5.1)
Sodium: 135 mmol/L (ref 135–145)
TCO2: 20 mmol/L — ABNORMAL LOW (ref 22–32)
pCO2, Ven: 42.2 mmHg — ABNORMAL LOW (ref 44–60)
pH, Ven: 7.251 (ref 7.25–7.43)
pO2, Ven: 65 mmHg — ABNORMAL HIGH (ref 32–45)

## 2022-02-10 LAB — I-STAT CHEM 8, ED
BUN: >130 mg/dL — ABNORMAL HIGH (ref 8–23)
Calcium, Ion: 1.01 mmol/L — ABNORMAL LOW (ref 1.15–1.40)
Chloride: 104 mmol/L (ref 98–111)
Creatinine, Ser: 6 mg/dL — ABNORMAL HIGH (ref 0.61–1.24)
Glucose, Bld: 59 mg/dL — ABNORMAL LOW (ref 70–99)
HCT: 20 % — ABNORMAL LOW (ref 39.0–52.0)
Hemoglobin: 6.8 g/dL — CL (ref 13.0–17.0)
Potassium: 6.6 mmol/L (ref 3.5–5.1)
Sodium: 135 mmol/L (ref 135–145)
TCO2: 20 mmol/L — ABNORMAL LOW (ref 22–32)

## 2022-02-10 LAB — COMPREHENSIVE METABOLIC PANEL
ALT: 743 U/L — ABNORMAL HIGH (ref 0–44)
AST: 1537 U/L — ABNORMAL HIGH (ref 15–41)
Albumin: 2.9 g/dL — ABNORMAL LOW (ref 3.5–5.0)
Alkaline Phosphatase: 1966 U/L — ABNORMAL HIGH (ref 38–126)
Anion gap: 22 — ABNORMAL HIGH (ref 5–15)
BUN: 122 mg/dL — ABNORMAL HIGH (ref 8–23)
CO2: 18 mmol/L — ABNORMAL LOW (ref 22–32)
Calcium: 8.8 mg/dL — ABNORMAL LOW (ref 8.9–10.3)
Chloride: 100 mmol/L (ref 98–111)
Creatinine, Ser: 5.57 mg/dL — ABNORMAL HIGH (ref 0.61–1.24)
GFR, Estimated: 10 mL/min — ABNORMAL LOW (ref >60)
Glucose, Bld: 63 mg/dL — ABNORMAL LOW (ref 70–99)
Potassium: 6.9 mmol/L (ref 3.5–5.1)
Sodium: 140 mmol/L (ref 135–145)
Total Bilirubin: 1.2 mg/dL (ref 0.3–1.2)
Total Protein: 6.6 g/dL (ref 6.5–8.1)

## 2022-02-10 LAB — IRON AND TIBC
Iron: 48 ug/dL (ref 45–182)
Saturation Ratios: 20 % (ref 17.9–39.5)
TIBC: 237 ug/dL — ABNORMAL LOW (ref 250–450)
UIBC: 189 ug/dL

## 2022-02-10 LAB — TROPONIN I (HIGH SENSITIVITY): Troponin I (High Sensitivity): 81 ng/L — ABNORMAL HIGH (ref <18)

## 2022-02-10 LAB — PROTIME-INR
INR: 1.3 — ABNORMAL HIGH (ref 0.8–1.2)
Prothrombin Time: 16.1 seconds — ABNORMAL HIGH (ref 11.4–15.2)

## 2022-02-10 LAB — FERRITIN: Ferritin: 2207 ng/mL — ABNORMAL HIGH (ref 24–336)

## 2022-02-10 LAB — TSH: TSH: 5.515 u[IU]/mL — ABNORMAL HIGH (ref 0.350–4.500)

## 2022-02-10 LAB — CBG MONITORING, ED
Glucose-Capillary: 34 mg/dL — CL (ref 70–99)
Glucose-Capillary: 135 mg/dL — ABNORMAL HIGH (ref 70–99)

## 2022-02-10 LAB — PREPARE RBC (CROSSMATCH)

## 2022-02-10 LAB — MAGNESIUM: Magnesium: 2.9 mg/dL — ABNORMAL HIGH (ref 1.7–2.4)

## 2022-02-10 LAB — BRAIN NATRIURETIC PEPTIDE: B Natriuretic Peptide: 1477.8 pg/mL — ABNORMAL HIGH (ref 0.0–100.0)

## 2022-02-10 MED ORDER — DEXTROSE 10 % IV SOLN: Freq: Once | INTRAVENOUS | Status: AC

## 2022-02-10 MED ORDER — CALCIUM GLUCONATE 10 % IV SOLN
1.0000 g | Freq: Once | INTRAVENOUS | Status: AC
Start: 2022-02-10 — End: 2022-02-10
  Administered 2022-02-10: 1 g via INTRAVENOUS

## 2022-02-10 MED ORDER — EPOETIN ALFA-EPBX 10000 UNIT/ML IJ SOLN: 10000.0000 [IU] | INTRAMUSCULAR | Status: DC

## 2022-02-10 MED ORDER — EPOETIN ALFA 10000 UNIT/ML IJ SOLN
INTRAMUSCULAR | Status: AC
  Administered 2022-02-10: 10000 [IU] via SUBCUTANEOUS
  Filled 2022-02-10: qty 1

## 2022-02-10 MED ORDER — DEXTROSE 50 % IV SOLN
1.0000 | Freq: Once | INTRAVENOUS | Status: AC
  Administered 2022-02-10: 50 mL via INTRAVENOUS
  Filled 2022-02-10: qty 50

## 2022-02-10 MED ORDER — SODIUM ZIRCONIUM CYCLOSILICATE 10 G PO PACK
10.0000 g | PACK | Freq: Once | ORAL | Status: AC
Start: 2022-02-10 — End: 2022-02-11
  Administered 2022-02-11: 10 g via ORAL
  Filled 2022-02-10: qty 1

## 2022-02-10 MED ORDER — ALTEPLASE 2 MG IJ SOLR: 2.0000 mg | Freq: Once | INTRAMUSCULAR | Status: DC | PRN

## 2022-02-10 MED ORDER — LIDOCAINE-PRILOCAINE 2.5-2.5 % EX CREA
1.0000 | TOPICAL_CREAM | CUTANEOUS | Status: DC | PRN
  Filled 2022-02-10: qty 5

## 2022-02-10 MED ORDER — DEXTROSE 50 % IV SOLN
1.0000 | Freq: Once | INTRAVENOUS | Status: AC
  Administered 2022-02-10: 50 mL via INTRAVENOUS

## 2022-02-10 MED ORDER — INSULIN ASPART 100 UNIT/ML IV SOLN: 5.0000 [IU] | Freq: Once | INTRAVENOUS | Status: DC

## 2022-02-10 MED ORDER — CHLORHEXIDINE GLUCONATE CLOTH 2 % EX PADS
6.0000 | MEDICATED_PAD | Freq: Every day | CUTANEOUS | Status: DC
Start: 2022-02-11 — End: 2022-02-15
  Administered 2022-02-11 – 2022-02-14 (×4): 6 via TOPICAL

## 2022-02-10 MED ORDER — ALBUTEROL SULFATE (2.5 MG/3ML) 0.083% IN NEBU
10.0000 mg | INHALATION_SOLUTION | Freq: Once | RESPIRATORY_TRACT | Status: AC
  Administered 2022-02-10: 10 mg via RESPIRATORY_TRACT
  Filled 2022-02-10: qty 12

## 2022-02-10 MED ORDER — LORAZEPAM 2 MG/ML IJ SOLN
1.0000 mg | Freq: Once | INTRAMUSCULAR | Status: AC
Start: 2022-02-10 — End: 2022-02-10
  Administered 2022-02-10: 1 mg via INTRAVENOUS
  Filled 2022-02-10: qty 1

## 2022-02-10 MED ORDER — CALCIUM GLUCONATE-NACL 2-0.675 GM/100ML-% IV SOLN
2.0000 g | Freq: Once | INTRAVENOUS | Status: AC
Start: 2022-02-10 — End: 2022-02-11
  Administered 2022-02-10: 2000 mg via INTRAVENOUS
  Filled 2022-02-10: qty 100

## 2022-02-10 MED ORDER — ANTICOAGULANT SODIUM CITRATE 4% (200MG/5ML) IV SOLN
5.0000 mL | Status: DC | PRN
  Administered 2022-02-11 – 2022-02-13 (×2): 5 mL
  Administered 2022-02-15: 3.2 mL
  Filled 2022-02-10 (×5): qty 5

## 2022-02-10 MED ORDER — INSULIN ASPART 100 UNIT/ML IV SOLN
5.0000 [IU] | Freq: Once | INTRAVENOUS | Status: AC
  Administered 2022-02-10: 5 [IU] via INTRAVENOUS

## 2022-02-10 MED ORDER — DEXTROSE 50 % IV SOLN
1.0000 | Freq: Once | INTRAVENOUS | Status: DC
  Filled 2022-02-10: qty 50

## 2022-02-10 MED ORDER — PIPERACILLIN-TAZOBACTAM 3.375 G IVPB 30 MIN
3.3750 g | Freq: Once | INTRAVENOUS | Status: AC
  Administered 2022-02-10: 3.375 g via INTRAVENOUS
  Filled 2022-02-10: qty 50

## 2022-02-10 MED ORDER — ONDANSETRON HCL 4 MG/2ML IJ SOLN
4.0000 mg | Freq: Once | INTRAMUSCULAR | Status: AC
  Administered 2022-02-10: 4 mg via INTRAVENOUS
  Filled 2022-02-10: qty 2

## 2022-02-10 MED ORDER — EPOETIN ALFA 10000 UNIT/ML IJ SOLN: 10000.0000 [IU] | Freq: Once | INTRAMUSCULAR | Status: AC

## 2022-02-10 MED ORDER — CALCIUM GLUCONATE 10 % IV SOLN
1.0000 g | Freq: Once | INTRAVENOUS | Status: DC
  Filled 2022-02-10: qty 10

## 2022-02-10 NOTE — ED Notes (Signed)
Radiology at bedside at this time.

## 2022-02-10 NOTE — Consult Note (Signed)
Adam Reid Admit Date: 02/12/2022 03/10/2022 [name] Requesting Physician:  [name] MD  Reason for Consult: Bradycardia, hyperkalemia, progressive CKD 5, uremia  HPI:  57M progressive stage V CKD followed by Dr. 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  In the ED he was found to have a junctional bradycardia with a heart rate in the 30s, potassium 6.6, QRS prolonged at 150.  Thus far he has received 10 mg of albuterol, 2 g of IV calcium gluconate, dextrose but has been hypoglycemic and has not yet received insulin.  Blood pressures are stable.  He has been hesitant to initiate dialysis but has been willing to receive it when it came to a life or death situation.  He is at the bedside with his son, and optometrist.  We discussed that he has life-threatening cardiac instability for progressive renal failure and hyperkalemia, I have recommended initiation of dialysis and all are in agreement.  He was to receive a blood transfusion tomorrow morning, hemoglobin here 6.5.  Morning  PMH Incudes: Left retroperitoneal paraganglioma further followed by [facility] oncology Advanced/CKD 5 Right upper arm AV graft placement with [name] 7/31 HFpEF DM2 Hypertension   Creat (mg/dL)  Date Value  03/26/2016 2.68 (H)  09/02/2015 2.23 (H)  03/23/2015 1.77 (H)  09/20/2014 1.71 (H)  06/24/2014 1.47 (H)  04/30/2014 1.80 (H)  10/19/2013 1.37 (H)  08/08/2013 1.34   Creatinine, Ser (mg/dL)  Date Value  03/10/2022 6.00 (H)  02/08/2022 5.00 (H)  01/29/2022 4.70 (H)  01/28/2022 4.45 (H)  01/13/2022 5.21 (H)  12/30/2021 5.16 (H)  12/29/2021 5.15 (H)  12/28/2021 5.11 (H)  12/27/2021 5.06 (H)  12/26/2021 5.19 (H)  12/26/2021 5.18 (H)  ] I/Os:  ROS Balance of 12 systems is  negative w/ exceptions as above  PMH  Past Medical History:  Diagnosis Date   Anemia    Anxiety    BPH (benign prostatic hyperplasia)    Cancer (HCC)    CHF (congestive heart failure) (HCC)    Chronic kidney disease    Depression    Diabetes mellitus without complication (HCC)    Type II   Dyspnea    ESRD (end stage renal disease) (HCC)    GERD (gastroesophageal reflux disease)    Heart murmur    History of blood transfusion    Hyperlipidemia    Hypertension    PAF (paroxysmal atrial fibrillation) ([place])    Thyroid disease    was on supplement, taken off by [name]   [facility]  Past Surgical History:  Procedure Laterality Date   A/V FISTULAGRAM Right 01/29/2022   Procedure: A/V Fistulagram;  Surgeon: [name], MD;  Location: [place] [facility];  Service: Cardiovascular;  Laterality: Right;   APPENDECTOMY     AV FISTULA PLACEMENT Right 09/04/2021   Procedure: RIGHT BRACHIOCEPHALIC ARTERIOVENOUS (AV) FISTULA CREATION;  Surgeon: [name], MD;  Location: [place];  Service: Vascular;  Laterality: Right;   [place] Right 02/08/2022   Procedure: RIGHT UPPER ARM GORE-TEX GRAFT INSERTION USING 4-27m STRETCH GORETEX GRAFT;  Surgeon: DAngelia Mould MD;  [place]  Service: Vascular;  Laterality: Right;   BIOPSY  12/25/2020   Procedure: BIOPSY;  Surgeon: MIrving Copas, MD;  [place]  Service: Gastroenterology;;   CARDIAC CATHETERIZATION  N/A 01/30/2016   Procedure: Left Heart Cath and Coronary Angiography;  Surgeon: [name], MD;  Location: [place] [facility];  Service: Cardiovascular;  Laterality: N/A;   ENTEROSCOPY N/A 12/25/2020   Procedure: ENTEROSCOPY;  Surgeon: [name], MD;  Location: [place];  Service: Gastroenterology;  Laterality: N/A;   GIVENS CAPSULE STUDY N/A 12/25/2020   Procedure: GIVENS CAPSULE STUDY;  Surgeon: [name]., MD;  Location: [place];   Service: Gastroenterology;  Laterality: N/A;   SUBMUCOSAL TATTOO INJECTION  12/25/2020   Procedure: SUBMUCOSAL TATTOO INJECTION;  Surgeon: [name], MD;  Location: [facility] ENDOSCOPY;  Service: Gastroenterology;;   FH  Family History  Problem Relation Age of Onset   Hypertension Mother    Hyperlipidemia Mother    Hypertension Sister    Kidney disease Father    Heart disease Brother 36       open heart surgery   Colon cancer Neg Hx    SH  reports that he has been smoking cigarettes. He has a 4.80 pack-year smoking history. He has never used smokeless tobacco. He reports that he does not drink alcohol and does not use drugs. Allergies  Allergies  Allergen Reactions   Pork-Derived Products Other (See Comments)    Patient "just does not eat this"   Home medications Prior to Admission medications   Medication Sig Start Date End Date Taking? Authorizing Provider  amiodarone (PACERONE) 200 MG tablet Take 1 tablet (200 mg total) by mouth daily. Follow up appt required for further refills. Please call [phone] to schedule an appt Patient taking differently: Take 200 mg by mouth daily. 11/30/21   Clegg, Amy D, NP  atorvastatin (LIPITOR) 80 MG tablet TAKE 1 TABLET(80 MG) BY MOUTH DAILY Patient taking differently: Take 80 mg by mouth daily. 12/22/20   [name], MD  bisacodyl (DULCOLAX) 10 MG suppository Place 1 suppository (10 mg total) rectally as needed for moderate constipation. 12/31/21   [name], MD  blood glucose meter kit and supplies Use up to two times daily as directed  ICD10 E10.9 E11.9 01/18/22   [name], MD  blood glucose meter kit and supplies Dispense based on patient and insurance preference. Use up to four times daily as directed. (FOR ICD-10 E10.9, E11.9). 02/08/22   [name], MD  CALPHRON 667 MG tablet Take 1,334 mg by mouth 3 (three) times daily with meals. 11/09/21   [provider]  Cholecalciferol (VITAMIN D3 PO) Take 1  tablet by mouth daily.    [provider]  Continuous Blood Gluc Receiver (FREESTYLE LIBRE 2 READER) DEVI 1 Application by Does not apply route every 14 (fourteen) days. 02/08/22   [name], MD  diltiazem (CARDIZEM CD) 180 MG 24 hr capsule TAKE 1 CAPSULE(180 MG) BY MOUTH DAILY Patient taking differently: Take 180 mg by mouth daily. 08/24/21   Bensimhon, Shaune Pascal, MD  doxazosin (CARDURA) 4 MG tablet Take 1 tablet (4 mg total) by mouth daily. 12/24/21 01/23/22  Shahmehdi, Valeria Batman, MD  ELIQUIS 2.5 MG TABS tablet TAKE 1 TABLET(2.5 MG) BY MOUTH TWICE DAILY Patient taking differently: Take 2.5 mg by mouth 2 (two) times daily. 10/30/21   Bensimhon, Shaune Pascal, MD  GlucoCom Lancets MISC Use for home glucose monitoring 09/02/15   Harrison Mons, PA  glucose blood (ONETOUCH VERIO) test strip Test up to 2 times per day.  Uncontrolled diabetes with hyperglycemia and stage 4 CKD. 04/13/19   [name],  MD  HYDROmorphone (DILAUDID) 2 MG tablet Take 0.5 tablets (1 mg total) by mouth every 12 (twelve) hours as needed for moderate pain. 12/31/21   [name], MD  insulin glargine (LANTUS) 100 UNIT/ML Solostar Pen Inject 5 Units into the skin daily. Patient taking differently: Inject 5 Units into the skin every morning. 01/26/22   [name], MD  Insulin Pen Needle 31G X 5 MM MISC Use as directed 12/31/21   [name], MD  lactulose (CHRONULAC) 10 GM/15ML solution Take 15 mLs (10 g total) by mouth 2 (two) times daily. Patient taking differently: Take 10 g by mouth 2 (two) times daily as needed for moderate constipation. 12/31/21   [name], MD  oxyCODONE-acetaminophen (PERCOCET) 5-325 MG tablet Take 1 tablet by mouth every 4 (four) hours as needed for severe pain. 02/08/22 02/08/23  [name], MD  OXYGEN Inhale 2.5 L into the lungs continuous.    [provider]  sitaGLIPtin (JANUVIA) 25 MG tablet TAKE 1 TABLET(25 MG) BY MOUTH DAILY Patient taking  differently: Take 25 mg by mouth daily. 08/31/21   [name], MD  torsemide (DEMADEX) 20 MG tablet Take 2 tablets (40 mg total) by mouth 2 (two) times daily. 10/05/21   Milford, Maricela Bo, FNP  vitamin B-12 (CYANOCOBALAMIN) 500 MCG tablet Take 500 mcg by mouth daily.    [provider]    Current Medications Scheduled Meds:  ondansetron (ZOFRAN) IV  4 mg Intravenous Once   Continuous Infusions:  calcium gluconate     PRN Meds:.  CBC Recent Labs  Lab 02/08/22 0811 03/11/2022 2130 02/28/2022 2140  WBC  --  5.1  --   NEUTROABS  --  4.4  --   HGB 8.2* 6.8* 6.5*  6.8*  HCT 24.0* 21.5* 19.0*  20.0*  MCV  --  92.3  --   PLT  --  170  --    Basic Metabolic Panel Recent Labs  Lab 02/08/22 0811 02/18/2022 2140  NA 136 135  135  K 4.5 6.6*  6.6*  CL 101 104  GLUCOSE 148* 59*  BUN 129* >130*  CREATININE 5.00* 6.00*    Physical Exam   Blood pressure (!) 116/46, pulse (!) 39, resp. rate 20, SpO2 96 %. GEN: Unwell appearing, receiving nebulizer, thin/cachectic ENT: NCAT EYES: EOMI CV: Bradycardic, regular PULM: Mild increased work of breathing ABD: Soft, nontender SKIN: No rashes or lesions EXT: 2+ lower extremity edema  Assessment 58M with advanced CKD 5 progressed senting with junctional bradycardia, progressive dyspnea, worsened renal failure, severe anemia.  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, progressive azotemia, needs immediate initiation of dialysis, will arrange with critical care for catheter placement overnight and first treatment soon as possible Junctional bradycardia secondary to hyperkalemia while on amiodarone and diltiazem, as above Hyperkalemia with EKG changes Anemia, severe, on ESA therapy as outpatient HFpEF DM2 Hypertension, blood pressure stable S/p RUE AVG placement 02/08/22 VVS Scot Dock  Plan HD tonight: 2K, 2h, no UF, no heparin, 1u PRBC with HD CCM consult for HD catheter He is now ESRD Additional 2gm IV Calcium now Lokelma 10gm to hedge on  the timing of HD start Will follow closely  [name]  03/02/2022, 10:54 PM

## 2022-02-10 NOTE — ED Provider Notes (Signed)
[facility] EMERGENCY DEPARTMENT Provider Note   CSN: [id number] Arrival date & time: 02/12/2022  2115     History {Add pertinent medical, surgical, social history, OB history to HPI:1} No chief complaint on file.   Adam Reid is a 76 y.o. male.  He is brought in by EMS for general weakness.  He has a history of CKD and has not started dialysis yet.  He is also anemic and is due for transfusion.  Family is noticed increased weakness and when they checked his heart rate found to be in the 30s.  Initial EKG showed wide-complex bradycardia with a good blood pressure and they initiated given the patient calcium  The history is provided by the patient, the EMS personnel and a relative.  Weakness Severity:  Severe Onset quality:  Gradual Progression:  Unchanged Chronicity:  New Relieved by:  Nothing Worsened by:  Activity Ineffective treatments:  Drinking fluids      Home Medications Prior to Admission medications   Medication Sig Start Date End Date Taking? Authorizing Provider  amiodarone (PACERONE) 200 MG tablet Take 1 tablet (200 mg total) by mouth daily. Follow up appt required for further refills. Please call [phone] to schedule an appt Patient taking differently: Take 200 mg by mouth daily. 11/30/21   Clegg, Amy D, NP  atorvastatin (LIPITOR) 80 MG tablet TAKE 1 TABLET(80 MG) BY MOUTH DAILY Patient taking differently: Take 80 mg by mouth daily. 12/22/20   [name], MD  bisacodyl (DULCOLAX) 10 MG suppository Place 1 suppository (10 mg total) rectally as needed for moderate constipation. 12/31/21   [name], MD  blood glucose meter kit and supplies Use up to two times daily as directed  ICD10 E10.9 E11.9 01/18/22   [name], MD  blood glucose meter kit and supplies Dispense based on patient and insurance preference. Use up to four times daily as directed. (FOR ICD-10 E10.9, E11.9). 02/08/22   [name], MD  CALPHRON 667 MG  tablet Take 1,334 mg by mouth 3 (three) times daily with meals. 11/09/21   [provider]  Cholecalciferol (VITAMIN D3 PO) Take 1 tablet by mouth daily.    [provider]  Continuous Blood Gluc Receiver (FREESTYLE LIBRE 2 READER) DEVI 1 Application by Does not apply route every 14 (fourteen) days. 02/08/22   [name], MD  diltiazem (CARDIZEM CD) 180 MG 24 hr capsule TAKE 1 CAPSULE(180 MG) BY MOUTH DAILY Patient taking differently: Take 180 mg by mouth daily. 08/24/21   Bensimhon, Shaune Pascal, MD  doxazosin (CARDURA) 4 MG tablet Take 1 tablet (4 mg total) by mouth daily. 12/24/21 01/23/22  Shahmehdi, Valeria Batman, MD  ELIQUIS 2.5 MG TABS tablet TAKE 1 TABLET(2.5 MG) BY MOUTH TWICE DAILY Patient taking differently: Take 2.5 mg by mouth 2 (two) times daily. 10/30/21   Bensimhon, Shaune Pascal, MD  GlucoCom Lancets MISC Use for home glucose monitoring 09/02/15   Harrison Mons, PA  glucose blood (ONETOUCH VERIO) test strip Test up to 2 times per day.  Uncontrolled diabetes with hyperglycemia and stage 4 CKD. 04/13/19   [name], MD  HYDROmorphone (DILAUDID) 2 MG tablet Take 0.5 tablets (1 mg total) by mouth every 12 (twelve) hours as needed for moderate pain. 12/31/21   [name], MD  insulin glargine (LANTUS) 100 UNIT/ML Solostar Pen Inject 5 Units into the skin daily. Patient taking differently: Inject 5 Units into the skin every morning. 01/26/22   [name],  Ranell Patrick, MD  Insulin Pen Needle 31G X 5 MM MISC Use as directed 12/31/21   [name], MD  lactulose (CHRONULAC) 10 GM/15ML solution Take 15 mLs (10 g total) by mouth 2 (two) times daily. Patient taking differently: Take 10 g by mouth 2 (two) times daily as needed for moderate constipation. 12/31/21   [name], MD  oxyCODONE-acetaminophen (PERCOCET) 5-325 MG tablet Take 1 tablet by mouth every 4 (four) hours as needed for severe pain. 02/08/22 02/08/23  [name], MD  OXYGEN Inhale 2.5 L into  the lungs continuous.    [provider]  sitaGLIPtin (JANUVIA) 25 MG tablet TAKE 1 TABLET(25 MG) BY MOUTH DAILY Patient taking differently: Take 25 mg by mouth daily. 08/31/21   [name], MD  torsemide (DEMADEX) 20 MG tablet Take 2 tablets (40 mg total) by mouth 2 (two) times daily. 10/05/21   Milford, Maricela Bo, FNP  vitamin B-12 (CYANOCOBALAMIN) 500 MCG tablet Take 500 mcg by mouth daily.    [provider]      Allergies    Pork-derived products    Review of Systems   Review of Systems  Neurological:  Positive for weakness.    Physical Exam Updated Vital Signs There were no vitals taken for this visit. Physical Exam Vitals and nursing note reviewed.  Constitutional:      General: He is not in acute distress.    Appearance: Normal appearance. He is well-developed. He is ill-appearing.  HENT:     Head: Normocephalic and atraumatic.  Eyes:     Conjunctiva/sclera: Conjunctivae normal.  Cardiovascular:     Rate and Rhythm: Bradycardia present.     Heart sounds: Murmur heard.  Pulmonary:     Effort: Pulmonary effort is normal. No respiratory distress.     Breath sounds: Normal breath sounds.  Abdominal:     Palpations: Abdomen is soft.     Tenderness: There is no abdominal tenderness. There is no guarding or rebound.  Genitourinary:    Comments: Foley catheter in place Musculoskeletal:        General: No swelling.     Cervical back: Neck supple.     Right lower leg: Edema present.     Left lower leg: Edema present.  Skin:    Capillary Refill: Capillary refill takes less than 2 seconds.     Coloration: Skin is pale.  Neurological:     General: No focal deficit present.     Mental Status: He is alert.     ED Results / Procedures / Treatments   Labs (all labs ordered are listed, but only abnormal results are displayed) Labs Reviewed  CBC WITH DIFFERENTIAL/PLATELET - Abnormal; Notable for the following components:      Result Value   RBC  2.33 (*)    Hemoglobin 6.8 (*)    HCT 21.5 (*)    RDW 16.1 (*)    Lymphs Abs 0.3 (*)    All other components within normal limits  PROTIME-INR - Abnormal; Notable for the following components:   Prothrombin Time 16.1 (*)    INR 1.3 (*)    All other components within normal limits  TSH - Abnormal; Notable for the following components:   TSH 5.515 (*)    All other components within normal limits  I-STAT CHEM 8, ED - Abnormal; Notable for the following components:   Potassium 6.6 (*)    BUN >130 (*)    Creatinine, Ser 6.00 (*)  Glucose, Bld 59 (*)    Calcium, Ion 1.01 (*)    TCO2 20 (*)    Hemoglobin 6.8 (*)    HCT 20.0 (*)    All other components within normal limits  I-STAT VENOUS BLOOD GAS, ED - Abnormal; Notable for the following components:   pCO2, Ven 42.2 (*)    pO2, Ven 65 (*)    Bicarbonate 18.6 (*)    TCO2 20 (*)    Acid-base deficit 8.0 (*)    Potassium 6.6 (*)    Calcium, Ion 1.02 (*)    HCT 19.0 (*)    Hemoglobin 6.5 (*)    All other components within normal limits  CBG MONITORING, ED - Abnormal; Notable for the following components:   Glucose-Capillary 34 (*)    All other components within normal limits  COMPREHENSIVE METABOLIC PANEL  BRAIN NATRIURETIC PEPTIDE  URINALYSIS, ROUTINE W REFLEX MICROSCOPIC  MAGNESIUM  GLUCOSE, RANDOM  BASIC METABOLIC PANEL  POTASSIUM  POTASSIUM  POTASSIUM  TROPONIN I (HIGH SENSITIVITY)    EKG None  Radiology DG Chest Port 1 View  Result Date: 03/10/2022 CLINICAL DATA:  Weakness. EXAM: PORTABLE CHEST 1 VIEW COMPARISON:  December 13, 2021 FINDINGS: The heart size and mediastinal contours are within normal limits. There is calcification of the aortic arch. Persistent marked severity multifocal airspace disease is seen within the mid and lower lungs. Stable bilateral pleural effusions are also noted, right greater than left. No pneumothorax is identified. The visualized skeletal structures are unremarkable. IMPRESSION: 1.  Persistent marked severity multifocal airspace disease with stable bilateral pleural effusions, right greater than left. Electronically Signed   By: Virgina Norfolk M.D.   On: 02/15/2022 21:45    Procedures .Critical Care  Performed by: Hayden Rasmussen, MD Authorized by: Hayden Rasmussen, MD   Critical care provider statement:    Critical care time (minutes):  45   Critical care time was exclusive of:  Separately billable procedures and treating other patients   Critical care was necessary to treat or prevent imminent or life-threatening deterioration of the following conditions:  Metabolic crisis   Critical care was time spent personally by me on the following activities:  Development of treatment plan with patient or surrogate, discussions with consultants, evaluation of patient's response to treatment, examination of patient, obtaining history from patient or surrogate, ordering and performing treatments and interventions, ordering and review of laboratory studies, ordering and review of radiographic studies, pulse oximetry, re-evaluation of patient's condition and review of old charts   I assumed direction of critical care for this patient from another provider in my specialty: no     {Document cardiac monitor, telemetry assessment procedure when appropriate:1}  Medications Ordered in ED Medications - No data to display  ED Course/ Medical Decision Making/ A&P Clinical Course as of 02/27/2022 2251  Wed Feb 10, 2022  2148 Calcium elevated on i-STAT at 6.6.  EKG showing junctional rhythm.  Have initiated hyperkalemic protocol. [MB]  2152 Chest x-ray showing large right effusion bilateral lower lobe airspace disease similar to prior 2 months ago. [MB]  2243 Dr. Joelyn Oms nephrology at bedside.  He wants to start dialysis on the patient tonight.  Recommends consult critical care to place dialysis catheter.  Another 2 g of calcium. [MB]    Clinical Course User Index [MB] Hayden Rasmussen,  MD  Medical Decision Making Amount and/or Complexity of Data Reviewed Labs: ordered. Radiology: ordered.  Risk Prescription drug management.  This patient complains of ***; this involves an extensive number of treatment Options and is a complaint that carries with it a high risk of complications and morbidity. The differential includes ***  I ordered, reviewed and interpreted labs, which included *** I ordered medication *** and reviewed PMP when indicated. I ordered imaging studies which included *** and I independently    visualized and interpreted imaging which showed *** Additional history obtained from *** Previous records obtained and reviewed *** I consulted *** and discussed lab and imaging findings and discussed disposition.  Cardiac monitoring reviewed, *** Social determinants considered, *** Critical Interventions: ***  After the interventions stated above, I reevaluated the patient and found *** Admission and further testing considered, ***    {Document critical care time when appropriate:1} {Document review of labs and clinical decision tools ie heart score, Chads2Vasc2 etc:1}  {Document your independent review of radiology images, and any outside records:1} {Document your discussion with family members, caretakers, and with consultants:1} {Document social determinants of health affecting pt's care:1} {Document your decision making why or why not admission, treatments were needed:1} Final Clinical Impression(s) / ED Diagnoses Final diagnoses:  None    Rx / DC Orders ED Discharge Orders     None

## 2022-02-10 NOTE — ED Triage Notes (Signed)
BIB [facility] from home c/o weakness, bradycardia, bilat feet swelling. HR in the 30's normally in the 80's, on [place] 3lpm baseline. Was suppose to start dialysis awhile ago with a fistula in rt arm however it never worked. Did a graph in the chest but still hasn't had dialysis. Hgb of 6 suppose to have blood transfusion at  home. PIV 18ga Lt AC. 1gm of calcium chloride, 1 amp of Bicarb, NS 37m. CBG 76.

## 2022-02-10 NOTE — ED Notes (Signed)
Provider at bedside attempting to place dialysis cath at this time

## 2022-02-10 NOTE — ED Notes (Signed)
ED provider at bedside at this time 

## 2022-02-11 ENCOUNTER — Other Ambulatory Visit: Payer: Self-pay

## 2022-02-11 ENCOUNTER — Inpatient Hospital Stay (HOSPITAL_COMMUNITY): Payer: Medicare Other

## 2022-02-11 ENCOUNTER — Inpatient Hospital Stay (HOSPITAL_COMMUNITY)
Admission: RE | Admit: 2022-02-11 | Discharge: 2022-02-11 | Disposition: A | Discharge status: Home / Self Care | Payer: Medicare Other | Source: Ambulatory Visit | Attending: Nephrology | Admitting: Nephrology

## 2022-02-11 ENCOUNTER — Emergency Department (HOSPITAL_COMMUNITY): Payer: Medicare Other

## 2022-02-11 ENCOUNTER — Encounter (HOSPITAL_COMMUNITY): Payer: Self-pay

## 2022-02-11 DIAGNOSIS — R17 Unspecified jaundice: Secondary | ICD-10-CM | POA: Diagnosis not present

## 2022-02-11 DIAGNOSIS — E875 Hyperkalemia: Secondary | ICD-10-CM | POA: Diagnosis not present

## 2022-02-11 DIAGNOSIS — Z992 Dependence on renal dialysis: Secondary | ICD-10-CM | POA: Diagnosis not present

## 2022-02-11 DIAGNOSIS — R7989 Other specified abnormal findings of blood chemistry: Secondary | ICD-10-CM | POA: Diagnosis not present

## 2022-02-11 DIAGNOSIS — R001 Bradycardia, unspecified: Secondary | ICD-10-CM | POA: Diagnosis not present

## 2022-02-11 DIAGNOSIS — R0600 Dyspnea, unspecified: Secondary | ICD-10-CM | POA: Diagnosis not present

## 2022-02-11 DIAGNOSIS — N2889 Other specified disorders of kidney and ureter: Secondary | ICD-10-CM | POA: Diagnosis not present

## 2022-02-11 DIAGNOSIS — K920 Hematemesis: Secondary | ICD-10-CM | POA: Diagnosis not present

## 2022-02-11 DIAGNOSIS — I959 Hypotension, unspecified: Secondary | ICD-10-CM | POA: Diagnosis not present

## 2022-02-11 DIAGNOSIS — Z7189 Other specified counseling: Secondary | ICD-10-CM | POA: Diagnosis not present

## 2022-02-11 DIAGNOSIS — T8384XA Pain from genitourinary prosthetic devices, implants and grafts, initial encounter: Secondary | ICD-10-CM | POA: Diagnosis not present

## 2022-02-11 DIAGNOSIS — K7689 Other specified diseases of liver: Secondary | ICD-10-CM | POA: Diagnosis not present

## 2022-02-11 DIAGNOSIS — N185 Chronic kidney disease, stage 5: Secondary | ICD-10-CM | POA: Diagnosis not present

## 2022-02-11 DIAGNOSIS — J9690 Respiratory failure, unspecified, unspecified whether with hypoxia or hypercapnia: Secondary | ICD-10-CM | POA: Diagnosis not present

## 2022-02-11 DIAGNOSIS — R040 Epistaxis: Secondary | ICD-10-CM | POA: Diagnosis not present

## 2022-02-11 DIAGNOSIS — Z8603 Personal history of neoplasm of uncertain behavior: Secondary | ICD-10-CM | POA: Diagnosis not present

## 2022-02-11 DIAGNOSIS — Z515 Encounter for palliative care: Secondary | ICD-10-CM | POA: Diagnosis not present

## 2022-02-11 DIAGNOSIS — D62 Acute posthemorrhagic anemia: Secondary | ICD-10-CM | POA: Diagnosis present

## 2022-02-11 DIAGNOSIS — R0902 Hypoxemia: Secondary | ICD-10-CM | POA: Diagnosis not present

## 2022-02-11 DIAGNOSIS — I5032 Chronic diastolic (congestive) heart failure: Secondary | ICD-10-CM | POA: Diagnosis not present

## 2022-02-11 DIAGNOSIS — J90 Pleural effusion, not elsewhere classified: Secondary | ICD-10-CM | POA: Diagnosis not present

## 2022-02-11 DIAGNOSIS — I81 Portal vein thrombosis: Secondary | ICD-10-CM | POA: Diagnosis not present

## 2022-02-11 DIAGNOSIS — D684 Acquired coagulation factor deficiency: Secondary | ICD-10-CM | POA: Diagnosis present

## 2022-02-11 DIAGNOSIS — I132 Hypertensive heart and chronic kidney disease with heart failure and with stage 5 chronic kidney disease, or end stage renal disease: Secondary | ICD-10-CM | POA: Diagnosis not present

## 2022-02-11 DIAGNOSIS — D631 Anemia in chronic kidney disease: Secondary | ICD-10-CM | POA: Diagnosis not present

## 2022-02-11 DIAGNOSIS — I517 Cardiomegaly: Secondary | ICD-10-CM | POA: Diagnosis not present

## 2022-02-11 DIAGNOSIS — R748 Abnormal levels of other serum enzymes: Secondary | ICD-10-CM | POA: Diagnosis not present

## 2022-02-11 DIAGNOSIS — K7402 Hepatic fibrosis, advanced fibrosis: Secondary | ICD-10-CM | POA: Diagnosis not present

## 2022-02-11 DIAGNOSIS — N186 End stage renal disease: Secondary | ICD-10-CM

## 2022-02-11 DIAGNOSIS — Y732 Prosthetic and other implants, materials and accessory gastroenterology and urology devices associated with adverse incidents: Secondary | ICD-10-CM | POA: Diagnosis not present

## 2022-02-11 DIAGNOSIS — E871 Hypo-osmolality and hyponatremia: Secondary | ICD-10-CM | POA: Diagnosis present

## 2022-02-11 DIAGNOSIS — R64 Cachexia: Secondary | ICD-10-CM | POA: Diagnosis present

## 2022-02-11 DIAGNOSIS — D649 Anemia, unspecified: Secondary | ICD-10-CM | POA: Diagnosis not present

## 2022-02-11 DIAGNOSIS — R531 Weakness: Secondary | ICD-10-CM | POA: Diagnosis not present

## 2022-02-11 DIAGNOSIS — I5030 Unspecified diastolic (congestive) heart failure: Secondary | ICD-10-CM | POA: Diagnosis not present

## 2022-02-11 DIAGNOSIS — D61818 Other pancytopenia: Secondary | ICD-10-CM | POA: Diagnosis present

## 2022-02-11 DIAGNOSIS — R627 Adult failure to thrive: Secondary | ICD-10-CM | POA: Diagnosis not present

## 2022-02-11 DIAGNOSIS — F32A Depression, unspecified: Secondary | ICD-10-CM | POA: Diagnosis present

## 2022-02-11 DIAGNOSIS — J9621 Acute and chronic respiratory failure with hypoxia: Secondary | ICD-10-CM | POA: Diagnosis present

## 2022-02-11 DIAGNOSIS — N189 Chronic kidney disease, unspecified: Secondary | ICD-10-CM | POA: Diagnosis not present

## 2022-02-11 DIAGNOSIS — E43 Unspecified severe protein-calorie malnutrition: Secondary | ICD-10-CM | POA: Diagnosis not present

## 2022-02-11 DIAGNOSIS — T8249XA Other complication of vascular dialysis catheter, initial encounter: Secondary | ICD-10-CM | POA: Diagnosis not present

## 2022-02-11 DIAGNOSIS — Y832 Surgical operation with anastomosis, bypass or graft as the cause of abnormal reaction of the patient, or of later complication, without mention of misadventure at the time of the procedure: Secondary | ICD-10-CM | POA: Diagnosis present

## 2022-02-11 DIAGNOSIS — E11649 Type 2 diabetes mellitus with hypoglycemia without coma: Secondary | ICD-10-CM | POA: Diagnosis present

## 2022-02-11 DIAGNOSIS — J340 Abscess, furuncle and carbuncle of nose: Secondary | ICD-10-CM | POA: Diagnosis not present

## 2022-02-11 DIAGNOSIS — K7200 Acute and subacute hepatic failure without coma: Secondary | ICD-10-CM | POA: Diagnosis not present

## 2022-02-11 DIAGNOSIS — I5033 Acute on chronic diastolic (congestive) heart failure: Secondary | ICD-10-CM | POA: Diagnosis present

## 2022-02-11 DIAGNOSIS — A4152 Sepsis due to Pseudomonas: Secondary | ICD-10-CM | POA: Diagnosis not present

## 2022-02-11 DIAGNOSIS — R9431 Abnormal electrocardiogram [ECG] [EKG]: Secondary | ICD-10-CM

## 2022-02-11 DIAGNOSIS — N19 Unspecified kidney failure: Secondary | ICD-10-CM | POA: Diagnosis present

## 2022-02-11 DIAGNOSIS — I480 Paroxysmal atrial fibrillation: Secondary | ICD-10-CM | POA: Diagnosis not present

## 2022-02-11 DIAGNOSIS — D447 Neoplasm of uncertain behavior of aortic body and other paraganglia: Secondary | ICD-10-CM | POA: Diagnosis not present

## 2022-02-11 DIAGNOSIS — T82590A Other mechanical complication of surgically created arteriovenous fistula, initial encounter: Secondary | ICD-10-CM | POA: Diagnosis present

## 2022-02-11 DIAGNOSIS — Z66 Do not resuscitate: Secondary | ICD-10-CM | POA: Diagnosis not present

## 2022-02-11 DIAGNOSIS — N390 Urinary tract infection, site not specified: Secondary | ICD-10-CM | POA: Diagnosis not present

## 2022-02-11 DIAGNOSIS — N179 Acute kidney failure, unspecified: Secondary | ICD-10-CM | POA: Diagnosis not present

## 2022-02-11 DIAGNOSIS — I10 Essential (primary) hypertension: Secondary | ICD-10-CM | POA: Diagnosis not present

## 2022-02-11 DIAGNOSIS — R188 Other ascites: Secondary | ICD-10-CM | POA: Diagnosis not present

## 2022-02-11 DIAGNOSIS — R109 Unspecified abdominal pain: Secondary | ICD-10-CM | POA: Diagnosis not present

## 2022-02-11 DIAGNOSIS — G9340 Encephalopathy, unspecified: Secondary | ICD-10-CM | POA: Diagnosis not present

## 2022-02-11 DIAGNOSIS — R609 Edema, unspecified: Secondary | ICD-10-CM | POA: Diagnosis not present

## 2022-02-11 DIAGNOSIS — A4150 Gram-negative sepsis, unspecified: Secondary | ICD-10-CM | POA: Diagnosis not present

## 2022-02-11 DIAGNOSIS — K761 Chronic passive congestion of liver: Secondary | ICD-10-CM | POA: Diagnosis not present

## 2022-02-11 DIAGNOSIS — Z4901 Encounter for fitting and adjustment of extracorporeal dialysis catheter: Secondary | ICD-10-CM | POA: Diagnosis not present

## 2022-02-11 LAB — CBC
HCT: 20.5 % — ABNORMAL LOW (ref 39.0–52.0)
HCT: 21.5 % — ABNORMAL LOW (ref 39.0–52.0)
Hemoglobin: 6.1 g/dL — CL (ref 13.0–17.0)
Hemoglobin: 7.1 g/dL — ABNORMAL LOW (ref 13.0–17.0)
MCH: 29.2 pg (ref 26.0–34.0)
MCH: 29.3 pg (ref 26.0–34.0)
MCHC: 29.8 g/dL — ABNORMAL LOW (ref 30.0–36.0)
MCHC: 33 g/dL (ref 30.0–36.0)
MCV: 88.8 fL (ref 80.0–100.0)
MCV: 98.1 fL (ref 80.0–100.0)
Platelets: 132 10*3/uL — ABNORMAL LOW (ref 150–400)
Platelets: 83 10*3/uL — ABNORMAL LOW (ref 150–400)
RBC: 2.09 MIL/uL — ABNORMAL LOW (ref 4.22–5.81)
RBC: 2.42 MIL/uL — ABNORMAL LOW (ref 4.22–5.81)
RDW: 15.3 % (ref 11.5–15.5)
RDW: 16.9 % — ABNORMAL HIGH (ref 11.5–15.5)
WBC: 7.4 10*3/uL (ref 4.0–10.5)
WBC: 7.5 10*3/uL (ref 4.0–10.5)
nRBC: 0 % (ref 0.0–0.2)
nRBC: 0 % (ref 0.0–0.2)

## 2022-02-11 LAB — HEPATIC FUNCTION PANEL
ALT: 3686 U/L — ABNORMAL HIGH (ref 0–44)
AST: 9636 U/L — ABNORMAL HIGH (ref 15–41)
Albumin: 2.5 g/dL — ABNORMAL LOW (ref 3.5–5.0)
Alkaline Phosphatase: 1645 U/L — ABNORMAL HIGH (ref 38–126)
Bilirubin, Direct: 1.4 mg/dL — ABNORMAL HIGH (ref 0.0–0.2)
Indirect Bilirubin: 1.2 mg/dL — ABNORMAL HIGH (ref 0.3–0.9)
Total Bilirubin: 2.6 mg/dL — ABNORMAL HIGH (ref 0.3–1.2)
Total Protein: 5.9 g/dL — ABNORMAL LOW (ref 6.5–8.1)

## 2022-02-11 LAB — GLUCOSE, CAPILLARY
Glucose-Capillary: 135 mg/dL — ABNORMAL HIGH (ref 70–99)
Glucose-Capillary: 84 mg/dL (ref 70–99)
Glucose-Capillary: 383 mg/dL — ABNORMAL HIGH (ref 70–99)
Glucose-Capillary: 54 mg/dL — ABNORMAL LOW (ref 70–99)
Glucose-Capillary: 68 mg/dL — ABNORMAL LOW (ref 70–99)
Glucose-Capillary: 69 mg/dL — ABNORMAL LOW (ref 70–99)
Glucose-Capillary: 80 mg/dL (ref 70–99)
Glucose-Capillary: 99 mg/dL (ref 70–99)
Glucose-Capillary: 135 mg/dL — ABNORMAL HIGH (ref 70–99)
Glucose-Capillary: 121 mg/dL — ABNORMAL HIGH (ref 70–99)

## 2022-02-11 LAB — BASIC METABOLIC PANEL WITH GFR
Anion gap: 22 — ABNORMAL HIGH (ref 5–15)
BUN: 118 mg/dL — ABNORMAL HIGH (ref 8–23)
CO2: 14 mmol/L — ABNORMAL LOW (ref 22–32)
Calcium: 8.5 mg/dL — ABNORMAL LOW (ref 8.9–10.3)
Chloride: 91 mmol/L — ABNORMAL LOW (ref 98–111)
Creatinine, Ser: 5.32 mg/dL — ABNORMAL HIGH (ref 0.61–1.24)
GFR, Estimated: 11 mL/min — ABNORMAL LOW
Glucose, Bld: 792 mg/dL (ref 70–99)
Potassium: 5.8 mmol/L — ABNORMAL HIGH (ref 3.5–5.1)
Sodium: 127 mmol/L — ABNORMAL LOW (ref 135–145)

## 2022-02-11 LAB — MRSA NEXT GEN BY PCR, NASAL: MRSA by PCR Next Gen: NOT DETECTED

## 2022-02-11 LAB — URINALYSIS, ROUTINE W REFLEX MICROSCOPIC
Bilirubin Urine: NEGATIVE
Glucose, UA: NEGATIVE mg/dL
Ketones, ur: NEGATIVE mg/dL
Nitrite: NEGATIVE
Protein, ur: 100 mg/dL — AB
RBC / HPF: >50 RBC/hpf — ABNORMAL HIGH (ref 0–5)
Specific Gravity, Urine: 1.01 (ref 1.005–1.030)
pH: 5 (ref 5.0–8.0)

## 2022-02-11 LAB — ECHOCARDIOGRAM LIMITED
Height: 66 in
Weight: 1925.94 oz

## 2022-02-11 LAB — POTASSIUM
Potassium: 6.7 mmol/L (ref 3.5–5.1)
Potassium: 5.3 mmol/L — ABNORMAL HIGH (ref 3.5–5.1)
Potassium: 5.7 mmol/L — ABNORMAL HIGH (ref 3.5–5.1)
Potassium: 5.7 mmol/L — ABNORMAL HIGH (ref 3.5–5.1)

## 2022-02-11 LAB — HEPATITIS C ANTIBODY: HCV Ab: NONREACTIVE

## 2022-02-11 LAB — PROTIME-INR
INR: 2.3 — ABNORMAL HIGH (ref 0.8–1.2)
Prothrombin Time: 24.7 seconds — ABNORMAL HIGH (ref 11.4–15.2)

## 2022-02-11 LAB — GLUCOSE, RANDOM: Glucose, Bld: 97 mg/dL (ref 70–99)

## 2022-02-11 LAB — TROPONIN I (HIGH SENSITIVITY): Troponin I (High Sensitivity): 82 ng/L — ABNORMAL HIGH (ref <18)

## 2022-02-11 LAB — BASIC METABOLIC PANEL
Anion gap: 19 — ABNORMAL HIGH (ref 5–15)
BUN: 85 mg/dL — ABNORMAL HIGH (ref 8–23)
CO2: 20 mmol/L — ABNORMAL LOW (ref 22–32)
Calcium: 8.3 mg/dL — ABNORMAL LOW (ref 8.9–10.3)
Chloride: 98 mmol/L (ref 98–111)
Creatinine, Ser: 4.02 mg/dL — ABNORMAL HIGH (ref 0.61–1.24)
GFR, Estimated: 15 mL/min — ABNORMAL LOW (ref >60)
Glucose, Bld: 76 mg/dL (ref 70–99)
Potassium: 5.3 mmol/L — ABNORMAL HIGH (ref 3.5–5.1)
Sodium: 137 mmol/L (ref 135–145)

## 2022-02-11 LAB — HEPATITIS B SURFACE ANTIGEN: Hepatitis B Surface Ag: NONREACTIVE

## 2022-02-11 LAB — HEPATITIS B SURFACE ANTIBODY,QUALITATIVE: Hep B S Ab: NONREACTIVE

## 2022-02-11 LAB — VITAMIN D 25 HYDROXY (VIT D DEFICIENCY, FRACTURES): Vit D, 25-Hydroxy: 38.19 ng/mL (ref 30–100)

## 2022-02-11 LAB — POCT HEMOGLOBIN-HEMACUE: Hemoglobin: 6.8 g/dL — CL (ref 13.0–17.0)

## 2022-02-11 LAB — ACETAMINOPHEN LEVEL: Acetaminophen (Tylenol), Serum: <10 ug/mL — ABNORMAL LOW (ref 10–30)

## 2022-02-11 LAB — HEPATITIS B CORE ANTIBODY, TOTAL: Hep B Core Total Ab: NONREACTIVE

## 2022-02-11 MED ORDER — SODIUM CHLORIDE 0.9 % IV SOLN
250.0000 mL | INTRAVENOUS | Status: DC
  Administered 2022-02-12: 250 mL via INTRAVENOUS
  Administered 2022-03-04 (×2): 50 mL via INTRAVENOUS

## 2022-02-11 MED ORDER — SODIUM CHLORIDE 0.9% FLUSH
10.0000 mL | Freq: Two times a day (BID) | INTRAVENOUS | Status: DC
  Administered 2022-02-11 (×2): 10 mL
  Administered 2022-02-12: 30 mL
  Administered 2022-02-12 – 2022-02-17 (×6): 10 mL

## 2022-02-11 MED ORDER — OXYCODONE HCL 5 MG PO TABS: 5.0000 mg | ORAL_TABLET | Freq: Four times a day (QID) | ORAL | Status: DC | PRN

## 2022-02-11 MED ORDER — FENTANYL CITRATE PF 50 MCG/ML IJ SOSY
12.5000 ug | PREFILLED_SYRINGE | Freq: Once | INTRAMUSCULAR | Status: AC
  Administered 2022-02-11: 12.5 ug via INTRAVENOUS
  Filled 2022-02-11: qty 1

## 2022-02-11 MED ORDER — RENA-VITE PO TABS
1.0000 | ORAL_TABLET | Freq: Every day | ORAL | Status: DC
  Administered 2022-02-11 – 2022-03-03 (×21): 1 via ORAL
  Filled 2022-02-11 (×21): qty 1

## 2022-02-11 MED ORDER — NEPRO/CARBSTEADY PO LIQD
237.0000 mL | Freq: Three times a day (TID) | ORAL | Status: DC
  Administered 2022-02-11 (×2): 237 mL via ORAL

## 2022-02-11 MED ORDER — BISACODYL 10 MG RE SUPP
10.0000 mg | Freq: Every day | RECTAL | Status: DC | PRN
  Administered 2022-02-12: 10 mg via RECTAL
  Filled 2022-02-11: qty 1

## 2022-02-11 MED ORDER — SODIUM CHLORIDE 0.9% FLUSH: 10.0000 mL | INTRAVENOUS | Status: DC | PRN

## 2022-02-11 MED ORDER — INSULIN ASPART 100 UNIT/ML IJ SOLN
0.0000 [IU] | INTRAMUSCULAR | Status: DC
  Administered 2022-02-14 – 2022-02-16 (×4): 1 [IU] via SUBCUTANEOUS
  Administered 2022-02-16: 2 [IU] via SUBCUTANEOUS

## 2022-02-11 MED ORDER — SODIUM CHLORIDE 0.9% IV SOLUTION: Freq: Once | INTRAVENOUS | Status: DC

## 2022-02-11 MED ORDER — AMIODARONE HCL 200 MG PO TABS: 200.0000 mg | ORAL_TABLET | Freq: Every day | ORAL | Status: DC

## 2022-02-11 MED ORDER — CEFAZOLIN SODIUM-DEXTROSE 2-4 GM/100ML-% IV SOLN
2.0000 g | INTRAVENOUS | Status: AC
  Administered 2022-02-15: 2 g via INTRAVENOUS
  Filled 2022-02-11 (×2): qty 100

## 2022-02-11 MED ORDER — LIDOCAINE HCL URETHRAL/MUCOSAL 2 % EX GEL
1.0000 | Freq: Once | CUTANEOUS | Status: AC
  Administered 2022-02-11: 1 via URETHRAL
  Filled 2022-02-11: qty 6

## 2022-02-11 MED ORDER — DOCUSATE SODIUM 100 MG PO CAPS: 100.0000 mg | ORAL_CAPSULE | Freq: Two times a day (BID) | ORAL | Status: DC | PRN

## 2022-02-11 MED ORDER — ORAL CARE MOUTH RINSE: 15.0000 mL | OROMUCOSAL | Status: DC | PRN

## 2022-02-11 MED ORDER — POLYETHYLENE GLYCOL 3350 17 G PO PACK
17.0000 g | PACK | Freq: Every day | ORAL | Status: DC | PRN
  Administered 2022-02-11: 17 g via ORAL
  Filled 2022-02-11: qty 1

## 2022-02-11 MED ORDER — NOREPINEPHRINE 4 MG/250ML-% IV SOLN: 2.0000 ug/min | INTRAVENOUS | Status: DC

## 2022-02-11 MED ORDER — DARBEPOETIN ALFA 200 MCG/0.4ML IJ SOSY
200.0000 ug | PREFILLED_SYRINGE | INTRAMUSCULAR | Status: DC
  Administered 2022-02-13 – 2022-02-20 (×2): 200 ug via INTRAVENOUS
  Filled 2022-02-11 (×5): qty 0.4

## 2022-02-11 MED ORDER — CHLORHEXIDINE GLUCONATE CLOTH 2 % EX PADS
6.0000 | MEDICATED_PAD | Freq: Every day | CUTANEOUS | Status: DC
Start: 2022-02-11 — End: 2022-02-22
  Administered 2022-02-11 – 2022-02-21 (×8): 6 via TOPICAL

## 2022-02-11 MED ORDER — APIXABAN 2.5 MG PO TABS: 2.5000 mg | ORAL_TABLET | Freq: Two times a day (BID) | ORAL | Status: DC

## 2022-02-11 MED ORDER — SENNA 8.6 MG PO TABS
1.0000 | ORAL_TABLET | Freq: Every day | ORAL | Status: DC
  Administered 2022-02-11: 8.6 mg via ORAL
  Filled 2022-02-11: qty 1

## 2022-02-11 MED ORDER — APIXABAN 2.5 MG PO TABS
2.5000 mg | ORAL_TABLET | Freq: Two times a day (BID) | ORAL | Status: DC
  Administered 2022-02-11 (×2): 2.5 mg via ORAL
  Filled 2022-02-11 (×2): qty 1

## 2022-02-11 MED ORDER — LORAZEPAM 2 MG/ML IJ SOLN
0.5000 mg | Freq: Once | INTRAMUSCULAR | Status: DC
  Filled 2022-02-11: qty 1

## 2022-02-11 NOTE — Consult Note (Addendum)
Chief Complaint: Patient was seen in consultation today for [facility] placement Chief Complaint  Patient presents with   Weakness   at the request of Otelia Santee   Referring Physician(s): Otelia Santee   Supervising Physician: Mir, Sharen Heck  Patient Status: [facility] - In-pt  History of Present Illness: Adam Reid is a 76 y.o. male with PMHs of HTN, HLD, CHF, DM, PAF on Eliquis,  chronic anemia on ESA therapy and ESRD s/p RUE AVG on 7/31 who presents today for tunneled dialysis catheter placement.   Patient presented to [place] ED with worsening weakness and fatigue and bilateral LE swelling for several days, found to have hyperkalemia and bradycardia, admitted to ICU for further eval and management. Nephrology has been following, patient was started on iHD today via temp cath placed by critical care team.   IR was requested for image guided tunneled dialysis catheter placement.   Patient laying in bed, not in acute distress. Son at bedside.  No interpreter was utilized per son and patient's request.  Patient appears to be oriented but appears lethargic,continues to fall a sleep.  ROS not obtained.   Son states that he is open for his father to have the temp cath concerted to perm cath, but he would like to use the temp cath and possibly avoid perm cath placement as patient recently got AVG.  Informed son that this PA will discuss with nephrology but the patient most likely will need a perm cath, he verbalized understanding.  Dr. Augustin Coupe from nephrology notified, per Dr. Augustin Coupe, after further discussion with son and spouse, they are agreeable to proceed.    Past Medical History:  Diagnosis Date   Anemia    Anxiety    BPH (benign prostatic hyperplasia)    Cancer (HCC)    CHF (congestive heart failure) (HCC)    Chronic kidney disease    Depression    Diabetes mellitus without complication (HCC)    Type II   Dyspnea    ESRD (end stage renal disease) (HCC)    GERD (gastroesophageal reflux  disease)    Heart murmur    History of blood transfusion    Hyperlipidemia    Hypertension    PAF (paroxysmal atrial fibrillation) ([place])    Thyroid disease    was on supplement, taken off by [name]    Past Surgical History:  Procedure Laterality Date   A/V FISTULAGRAM Right 01/29/2022   Procedure: A/V Fistulagram;  Surgeon: [name], MD;  Location: [place] [facility];  Service: Cardiovascular;  Laterality: Right;   APPENDECTOMY     AV FISTULA PLACEMENT Right 09/04/2021   Procedure: RIGHT BRACHIOCEPHALIC ARTERIOVENOUS (AV) FISTULA CREATION;  Surgeon: [name], MD;  Location: [place];  Service: Vascular;  Laterality: Right;   [place] Right 02/08/2022   Procedure: RIGHT UPPER ARM GORE-TEX GRAFT INSERTION USING 4-12m STRETCH GORETEX GRAFT;  Surgeon: DAngelia Mould MD;  [place] [facility]  Service: Vascular;  Laterality: Right;   BIOPSY  12/25/2020   Procedure: BIOPSY;  Surgeon: MIrving Copas, MD;  [place] [facility]  Service: Gastroenterology;;   CARDIAC CATHETERIZATION N/A 01/30/2016   Procedure: Left Heart Cath and Coronary Angiography;  Surgeon: JJettie Booze MD;  [facility];  Service: Cardiovascular;  Laterality: N/A;   ENTEROSCOPY N/A 12/25/2020   Procedure: ENTEROSCOPY;  Surgeon: MRush LandmarkGTelford Nab, MD;  [place] [facility]  Service: Gastroenterology;  Laterality: N/A;   GIVENS CAPSULE STUDY N/A 12/25/2020  Procedure: GIVENS CAPSULE STUDY;  Surgeon: [name]., MD;  Location: [place];  Service: Gastroenterology;  Laterality: N/A;   SUBMUCOSAL TATTOO INJECTION  12/25/2020   Procedure: SUBMUCOSAL TATTOO INJECTION;  Surgeon: [name], MD;  Location: [place];  Service: Gastroenterology;;    Allergies: Pork-derived products  Medications: Prior to Admission medications   Medication Sig Start Date End Date Taking? Authorizing Provider  amiodarone  (PACERONE) 200 MG tablet Take 1 tablet (200 mg total) by mouth daily. Follow up appt required for further refills. Please call [phone] to schedule an appt Patient taking differently: Take 200 mg by mouth daily. 11/30/21   Clegg, Amy D, NP  atorvastatin (LIPITOR) 80 MG tablet TAKE 1 TABLET(80 MG) BY MOUTH DAILY Patient taking differently: Take 80 mg by mouth daily. 12/22/20   [name], MD  bisacodyl (DULCOLAX) 10 MG suppository Place 1 suppository (10 mg total) rectally as needed for moderate constipation. 12/31/21   [name], MD  blood glucose meter kit and supplies Use up to two times daily as directed  ICD10 E10.9 E11.9 01/18/22   [name], MD  blood glucose meter kit and supplies Dispense based on patient and insurance preference. Use up to four times daily as directed. (FOR ICD-10 E10.9, E11.9). 02/08/22   [name], MD  CALPHRON 667 MG tablet Take 1,334 mg by mouth 3 (three) times daily with meals. 11/09/21   [provider]  Continuous Blood Gluc Receiver (FREESTYLE LIBRE 2 READER) DEVI 1 Application by Does not apply route every 14 (fourteen) days. 02/08/22   [name], MD  diltiazem (CARDIZEM CD) 180 MG 24 hr capsule TAKE 1 CAPSULE(180 MG) BY MOUTH DAILY Patient taking differently: Take 180 mg by mouth daily. 08/24/21   Bensimhon, Shaune Pascal, MD  doxazosin (CARDURA) 4 MG tablet Take 1 tablet (4 mg total) by mouth daily. 12/24/21 01/23/22  Shahmehdi, Valeria Batman, MD  ELIQUIS 2.5 MG TABS tablet TAKE 1 TABLET(2.5 MG) BY MOUTH TWICE DAILY Patient taking differently: Take 2.5 mg by mouth 2 (two) times daily. 10/30/21   Bensimhon, Shaune Pascal, MD  GlucoCom Lancets MISC Use for home glucose monitoring 09/02/15   Harrison Mons, PA  glucose blood (ONETOUCH VERIO) test strip Test up to 2 times per day.  Uncontrolled diabetes with hyperglycemia and stage 4 CKD. 04/13/19   [name], MD  HYDROmorphone (DILAUDID) 2 MG tablet Take 0.5 tablets (1 mg total) by  mouth every 12 (twelve) hours as needed for moderate pain. 12/31/21   [name], MD  insulin glargine (LANTUS) 100 UNIT/ML Solostar Pen Inject 5 Units into the skin daily. Patient taking differently: Inject 10 Units into the skin daily. 01/26/22   [name], MD  Insulin Pen Needle 31G X 5 MM MISC Use as directed 12/31/21   [name], MD  lactulose (CHRONULAC) 10 GM/15ML solution Take 15 mLs (10 g total) by mouth 2 (two) times daily. Patient taking differently: Take 10 g by mouth 2 (two) times daily as needed for moderate constipation. 12/31/21   [name], MD  oxyCODONE-acetaminophen (PERCOCET) 5-325 MG tablet Take 1 tablet by mouth every 4 (four) hours as needed for severe pain. 02/08/22 02/08/23  [name], MD  OXYGEN Inhale 2.5 L into the lungs continuous.    [provider]  sitaGLIPtin (JANUVIA) 25 MG tablet TAKE 1 TABLET(25 MG) BY MOUTH DAILY Patient taking differently: Take 25 mg by mouth daily. 08/31/21  [name], MD  torsemide (DEMADEX) 20 MG tablet Take 2 tablets (40 mg total) by mouth 2 (two) times daily. Patient taking differently: Take 60 mg by mouth 2 (two) times daily. 10/05/21   Milford, Maricela Bo, FNP  vitamin B-12 (CYANOCOBALAMIN) 500 MCG tablet Take 500 mcg by mouth daily.    [provider]     Family History  Problem Relation Age of Onset   Hypertension Mother    Hyperlipidemia Mother    Hypertension Sister    Kidney disease Father    Heart disease Brother 60       open heart surgery   Colon cancer Neg Hx     Social History   Socioeconomic History   Marital status: Married    Spouse name: [name]   Number of children: 1   Years of education: 12th grade   Highest education level: Not on file  Occupational History   Occupation: Retired-accountant  Tobacco Use   Smoking status: Some Days    Packs/day: 0.10    Years: 48.00    Total pack years: 4.80    Types: Cigarettes    Last attempt to quit:  07/16/2019    Years since quitting: 2.5   Smokeless tobacco: Never   Tobacco comments:    09/03/21 Wife states, "he hides from me, so I am not sure how much he smokes. " Wife says he smokes occassionally 4 cigs a day."  Vaping Use   Vaping Use: Never used  Substance and Sexual Activity   Alcohol use: No    Alcohol/week: 0.0 standard drinks of alcohol   Drug use: No   Sexual activity: Never    Partners: Female  Other Topics Concern   Not on file  Social History Narrative   Originally from [place]. Came to the [place] in 2009, following their son who came here for school.   Married.   Lives with his wife.   Their adult son lives in [place], [place], where he is in optometry school.   Education: [employer]   Exercise: No   Social Determinants of Radio broadcast assistant Strain: Not on file  Food Insecurity: Not on file  Transportation Needs: Not on file  Physical Activity: Not on file  Stress: Not on file  Social Connections: Not on file     Review of Systems: A 12 point ROS discussed and pertinent positives are indicated in the HPI above.  All other systems are negative.  Vital Signs: BP (!) 123/51   Pulse 94   Temp 99.2 F (37.3 C) (Oral)   Resp (!) 31   Ht 5' 6" (1.676 m)   Wt 120 lb 5.9 oz (54.6 kg)   SpO2 92%   BMI 19.43 kg/m    Physical Exam Vitals and nursing note reviewed.  Constitutional:      General: He is not in acute distress.    Appearance: He is ill-appearing.  HENT:     Head: Normocephalic.     Mouth/Throat:     Mouth: Mucous membranes are moist.     Pharynx: Oropharynx is clear.  Neck:     Comments: +  R IJ temp cath  Cardiovascular:     Rate and Rhythm: Normal rate and regular rhythm.  Pulmonary:     Breath sounds: Normal breath sounds.     Comments: Labored breathing  Abdominal:     General: Abdomen is flat. Bowel sounds are normal.  Skin:    General:  Skin is warm and dry.     Coloration: Skin is not jaundiced or pale.  Neurological:      Mental Status: He is oriented to person, place, and time.  Psychiatric:     Comments: Lethargic      MD Evaluation Airway: WNL Heart: WNL Abdomen: WNL Chest/ Lungs: WNL ASA  Classification: 3 Mallampati/Airway Score: Two  Imaging: ECHOCARDIOGRAM LIMITED  Result Date: 02/11/2022    ECHOCARDIOGRAM LIMITED REPORT   Patient Name:   Adam Reid Date of Exam: 02/11/2022 Medical Rec #:  [id number]        Height:       66.0 in Accession #:    [id number]       Weight:       120.4 lb Date of Birth:  02-28-1946        BSA:          1.612 m Patient Age:    67 years         BP:           136/56 mmHg Patient Gender: M                HR:           92 bpm. Exam Location:  Inpatient Procedure: Limited Echo, Color Doppler and Cardiac Doppler Indications:    R94.31 Abnormal EKG  History:        Patient has prior history of Echocardiogram examinations, most                 recent 11/14/2021. CHF, Arrythmias:Atrial Fibrillation; Risk                 Factors:Hypertension, Diabetes, Dyslipidemia and CKD.  Sonographer:    Raquel Sarna Senior RDCS Referring Phys: [id number] Francesca Jewett  Sonographer Comments: Exam performed sitting mostly upright due to dypsnea. IMPRESSIONS  1. Left ventricular ejection fraction, by estimation, is 55 to 60%. The left ventricle has normal function.  2. Right ventricular systolic function is normal. The right ventricular size is moderately enlarged. There is moderately elevated pulmonary artery systolic pressure.  3. Left atrial size was mildly dilated.  4. The mitral valve is normal in structure. Mild mitral valve regurgitation. No evidence of mitral stenosis.  5. Tricuspid valve regurgitation is moderate.  6. The aortic valve is tricuspid.  7. The inferior vena cava is dilated in size with <50% respiratory variability, suggesting right atrial pressure of 15 mmHg. FINDINGS  Left Ventricle: Left ventricular ejection fraction, by estimation, is 55 to 60%. The left ventricle has normal function. Right  Ventricle: The right ventricular size is moderately enlarged. Right ventricular systolic function is normal. There is moderately elevated pulmonary artery systolic pressure. The tricuspid regurgitant velocity is 3.18 m/s, and with an assumed right atrial pressure of 15 mmHg, the estimated right ventricular systolic pressure is 60.1 mmHg. Left Atrium: Left atrial size was mildly dilated. Mitral Valve: The mitral valve is normal in structure. Mild mitral valve regurgitation. No evidence of mitral valve stenosis. Tricuspid Valve: The tricuspid valve is normal in structure. Tricuspid valve regurgitation is moderate . No evidence of tricuspid stenosis. Aortic Valve: The aortic valve is tricuspid. Pulmonic Valve: The pulmonic valve was normal in structure. Pulmonic valve regurgitation is not visualized. No evidence of pulmonic stenosis. Aorta: Aortic root could not be assessed. Venous: The inferior vena cava is dilated in size with less than 50% respiratory variability, suggesting right atrial pressure of 15 mmHg. LEFT VENTRICLE PLAX 2D LVOT  diam:     2.00 cm LV SV:         61 LV SV Index:   38 LVOT Area:     3.14 cm  RIGHT VENTRICLE RV S prime:     17.40 cm/s TAPSE (M-mode): 2.2 cm AORTIC VALVE LVOT Vmax:   119.00 cm/s LVOT Vmean:  71.200 cm/s LVOT VTI:    0.195 m TRICUSPID VALVE TR Peak grad:   40.4 mmHg TR Vmax:        318.00 cm/s  SHUNTS Systemic VTI:  0.20 m Systemic Diam: 2.00 cm Kardie Tobb DO Electronically signed by Berniece Salines DO Signature Date/Time: 02/11/2022/12:34:17 PM    Final    CT ABDOMEN PELVIS WO CONTRAST  Result Date: 02/11/2022 CLINICAL DATA:  Abdominal pain, acute, nonlocalized. Left retroperitoneal paraganglioma, chronic kidney disease. EXAM: CT ABDOMEN AND PELVIS WITHOUT CONTRAST TECHNIQUE: Multidetector CT imaging of the abdomen and pelvis was performed following the standard protocol without IV contrast. RADIATION DOSE REDUCTION: This exam was performed according to the departmental  dose-optimization program which includes automated exposure control, adjustment of the mA and/or kV according to patient size and/or use of iterative reconstruction technique. COMPARISON:  01/28/2022. FINDINGS: Lower chest: The heart is enlarged and there is no pericardial effusion. Scattered coronary artery calcifications are noted. There are loculated pleural effusions bilaterally with patchy infiltrates. Hepatobiliary: No focal liver abnormality. No biliary ductal dilatation. The gallbladder is not visualized on exam. Pancreas: Unremarkable. No pancreatic ductal dilatation or surrounding inflammatory changes. Spleen: Normal in size without focal abnormality. Adrenals/Urinary Tract: Mild thickening of the adrenal glands with no evidence of discrete nodule. Cysts are noted in the kidneys bilaterally. There are tiny subcentimeter hyperdense structures in the kidneys bilaterally, possible hemorrhagic or proteinaceous cysts. No renal calculus or hydronephrosis. There is a slightly hyperdense lesion in the upper pole the right kidney measuring 2.4 cm. A Foley catheter is noted in the urinary bladder and the bladder is nondistended. Stomach/Bowel: Stomach is within normal limits. Appendix is not seen. No evidence of bowel wall thickening, distention, or inflammatory changes. No free air or pneumatosis. A few scattered diverticular present along the colon without evidence of diverticulitis. A moderate amount of retained stool is present in the colon. Vascular/Lymphatic: Aortic atherosclerosis. A stable mass is noted in the retroperitoneum on the left measuring 7.1 x 5.5 cm, compatible with known paraganglioma. No abdominal or pelvic lymphadenopathy. Reproductive: The prostate gland is enlarged. Other: There is a small left inguinal hernia containing nonobstructed bowel. Mesenteric edema is noted. No abdominopelvic ascites. Musculoskeletal: Degenerative changes are present in the thoracolumbar spine. No acute osseous  abnormality. IMPRESSION: 1. No acute intra-abdominal process. 2. Moderate amount of retained stool in the colon suggesting constipation. 3. Loculated pleural effusions bilaterally with strandy atelectasis or infiltrate, not significantly changed from the prior exam. 4. Stable retroperitoneal mass on the left, previously characterized as paraganglioma by history. 5. Bilateral renal cysts. There is a complex lesion in the upper pole of the right kidney measuring 2.4 cm. Ultrasound is recommended for further characterization on follow-up. 6. Aortic atherosclerosis. 7. Remaining incidental findings as described above. Electronically Signed   By: Brett Fairy M.D.   On: 02/11/2022 02:00   DG Chest Portable 1 View  Result Date: 02/11/2022 CLINICAL DATA:  Status post central line placement EXAM: PORTABLE CHEST 1 VIEW COMPARISON:  Chest x-ray from the previous day. FINDINGS: Cardiac shadow is enlarged but stable. New right jugular central line is noted at the cavoatrial junction. Stable  effusions are seen right greater than left as well as basilar airspace disease right greater than left. No pneumothorax is seen. IMPRESSION: No pneumothorax following right jugular central line placement Remainder of the exam is stable. Electronically Signed   By: Inez Catalina M.D.   On: 02/11/2022 00:28   DG Chest Port 1 View  Result Date: 02/18/2022 CLINICAL DATA:  Weakness. EXAM: PORTABLE CHEST 1 VIEW COMPARISON:  December 13, 2021 FINDINGS: The heart size and mediastinal contours are within normal limits. There is calcification of the aortic arch. Persistent marked severity multifocal airspace disease is seen within the mid and lower lungs. Stable bilateral pleural effusions are also noted, right greater than left. No pneumothorax is identified. The visualized skeletal structures are unremarkable. IMPRESSION: 1. Persistent marked severity multifocal airspace disease with stable bilateral pleural effusions, right greater than left.  Electronically Signed   By: Virgina Norfolk M.D.   On: 02/09/2022 21:45   PERIPHERAL VASCULAR CATHETERIZATION  Result Date: 01/29/2022 Table formatting from the original result was not included. INDICATIONS:  Adam Reid is a 76 y.o. male who was set up for a fistulogram as his fistula was nonfunctioning.  The history is obtained through the translator and as I understand that he is not yet on dialysis.  His fistula was placed on 09/04/2021.  It was a right brachiocephalic fistula. PROCEDURE:  Fistulogram right brachiocephalic AV fistula SURGEON: Judeth Cornfield. Scot Dock, MD, FACS ANESTHESIA: Local EBL: Minimal TECHNIQUE: The patient was brought to the peripheral vascular lab and the right arm was prepped and draped in usual sterile fashion.  After the skin was anesthetized with 1% lidocaine I cannulated the vein with a micropuncture needle and a micropuncture sheath was introduced over a wire.  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  There was reflux into the basilic vein which did appear to be patent.  There was no evidence of central venous obstruction.  I did attempt to pass a wire through the fistula but this was chronically occluded and this was unsuccessful.  A 4-0 Monocryl was used to close the cannulation site and there was good hemostasis. FINDINGS: No central venous occlusion 2.  Patent basilic vein right arm 3.  Occluded right brachiocephalic fistula CLINICAL NOTE: The patient will be scheduled for either a basilic vein transposition on the right or an AV graft.  We will discuss with nephrology if he needs a catheter.   CT Abdomen Pelvis Wo Contrast  Result Date: 01/28/2022 CLINICAL DATA:  Bowel obstruction suspected EXAM: CT ABDOMEN AND PELVIS WITHOUT CONTRAST TECHNIQUE: Multidetector CT imaging of the abdomen and pelvis was performed following the standard protocol without IV contrast. RADIATION DOSE REDUCTION: This exam was performed according to  the departmental dose-optimization program which includes automated exposure control, adjustment of the mA and/or kV according to patient size and/or use of iterative reconstruction technique. COMPARISON:  CT chest, abdomen and pelvis dated December 13, 2021 FINDINGS: Lower chest: Cardiomegaly. Small loculated right-greater-than-left pleural effusions with pleural thickening. Bilateral rounded airspace opacities which are likely due to round atelectasis. Ground-glass opacities of the lung bases with associated septal thickening. Hepatobiliary: No suspicious liver lesions. No biliary ductal dilation. Pancreas: Unremarkable. No pancreatic ductal dilatation or surrounding inflammatory changes. Spleen: Normal in size without focal abnormality. Adrenals/Urinary Tract: Thickening of the left-greater-than-right adrenal glands, similar to prior exam. No hydronephrosis or nephrolithiasis. Unchanged bilateral simple appearing and minimally complex renal cysts. Bladder is decompressed and contains a Foley catheter. Stomach/Bowel: Stomach  is unremarkable. No bowel wall thickening or inflammatory change. Large stool burden seen throughout the colon. No evidence of obstruction. Vascular/Lymphatic: Aortic atherosclerosis. No definite enlarged lymph nodes seen in the abdomen or pelvis. Reproductive: Prostatomegaly. Other: Large left periaortic lesion which appears to be centrally necrotic measuring 7.0 x 5.6 cm, unchanged in size when compared with prior exam and remeasured in similar plane. Small right inguinal hernia containing nondilated loops of small bowel. Musculoskeletal: No acute or significant osseous findings. IMPRESSION: 1. Large stool burden seen throughout the colon, compatible with constipation. No evidence of obstruction. 2. Stable size of large left retroperitoneal lesion. 3. Small right-greater-than-left chronic appearing pleural effusions. 4. Cardiomegaly and pulmonary edema. 5.  Aortic Atherosclerosis (ICD10-I70.0).  Electronically Signed   By: Yetta Glassman M.D.   On: 01/28/2022 13:20    Labs:  CBC: Recent Labs    01/28/22 1315 01/29/22 1050 03/11/2022 2130 03/09/2022 2140 02/11/22 0000 02/11/22 0845  WBC 8.0  --  5.1  --  7.5 7.4  HGB 7.3*   < > 6.8* 6.5*  6.8* 6.1* 7.1*  HCT 23.2*   < > 21.5* 19.0*  20.0* 20.5* 21.5*  PLT 197  --  170  --  132* 83*   < > = values in this interval not displayed.    COAGS: Recent Labs    11/20/21 0352 02/20/2022 2130 02/11/22 0921  INR 1.1 1.3* 2.3*    BMP: Recent Labs    01/28/22 1315 01/29/22 1050 02/12/2022 2130 03/07/2022 2140 02/11/22 0000 02/11/22 0250 02/11/22 0845  NA 134*   < > 140 135  135 127*  --  137  K 4.6   < > 6.9* 6.6*  6.6* 5.8* 6.7* 5.3*  5.3*  CL 95*   < > 100 104 91*  --  98  CO2 24  --  18*  --  14*  --  20*  GLUCOSE 305*   < > 63* 59* 792* 97 76  BUN 120*   < > 122* >130* 118*  --  85*  CALCIUM 8.3*  --  8.8*  --  8.5*  --  8.3*  CREATININE 4.45*   < > 5.57* 6.00* 5.32*  --  4.02*  GFRNONAA 13*  --  10*  --  11*  --  15*   < > = values in this interval not displayed.    LIVER FUNCTION TESTS: Recent Labs    09/16/21 0057 09/17/21 0052 11/12/21 1730 11/16/21 0305 12/30/21 0328 01/13/22 1308 02/12/2022 2130 02/11/22 0845  BILITOT 0.3  --  0.7  --   --   --  1.2 2.6*  AST 18  --  24  --   --   --  1,537* 9,636*  ALT 23  --  48*  --   --   --  743* 3,686*  ALKPHOS 172*  --  456*  --   --   --  1,966* 1,645*  PROT 7.4  --  7.3  --   --   --  6.6 5.9*  ALBUMIN 3.0*   < > 3.4*   < > 3.2* 3.4* 2.9* 2.5*   < > = values in this interval not displayed.    TUMOR MARKERS: No results for input(s): "AFPTM", "CEA", "CA199", "CHROMGRNA" in the last 8760 hours.  Assessment and Plan: 76 y.o. male with hx CKD stage 5 s/p RUE AVG creation on 7/31, currently hospitalized due to hyperkaliemia and brady cardia, he has progressed to ESRD  and in need of long term HD access.   VS stable but tachypenic at times, a febrile  CBC  today with normal WBCc, anemia hgb 7.1, thrombocytopenia 83 - critical care is transfusing when hgb less than 7  CMP hyperkalemia 5.3 (6.7 early AM before dialysis) INR 2.3   Per nephrology order, [facility] placement can be done Friday or Monday - pt has a temp cath.  Patient currently critically ill, has no d/c plan yet.  Risks and benefits discussed with the patient including, but not limited to bleeding, infection, vascular injury, pneumothorax which may require chest tube placement, air embolism or even death  All of the patient's questions were answered, patient is agreeable to proceed. Consent signed and in IR.   TDC placement is tentatively scheduled for Monday 8/7, if patient stable for moderate sedation. If patient is not stable for [facility] placement next Monday, IR will  place a  tunneled dialysis catheter during this hospitalization.   PLAN - NPO at [facility] [place] - Ancef 2g to be given in IR during procedure - ordered    Thank you for this interesting consult.  I greatly enjoyed meeting Saatvik Thielman and look forward to participating in their care.  A copy of this report was sent to the requesting provider on this date.  Electronically Signed: Tera Mater, PA-C 02/11/2022, 1:49 PM   I spent a total of 20 Minutes    in face to face in clinical consultation, greater than 50% of which was counseling/coordinating care for [facility] placement.   This chart was dictated using voice recognition software.  Despite best efforts to proofread,  errors can occur which can change the documentation meaning.

## 2022-02-11 NOTE — ED Notes (Signed)
Critical care at bedside  

## 2022-02-11 NOTE — Progress Notes (Signed)
[facility] Progress Note Patient Name: Adam Reid DOB: [birth year] MRN: [id number]   Date of Service  02/11/2022  HPI/Events of Note  Patient came up to the unit from the ED with a Foley catheter in place without an order  eICU Interventions  Foley catheter order entered.        Kerry Kass Damyra Luscher 02/11/2022, 6:24 AM

## 2022-02-11 NOTE — Procedures (Signed)
Central Venous Catheter Insertion Procedure Note  Adam Reid  [id number]  21-Mar-1946  Date:02/11/22  Time:12:23 AM   Provider Performing:Cache Bills R Kinlee Garrison   Procedure: Insertion of Non-tunneled Central Venous Catheter(36556)with US guidance (40352)    Indication(s) Hemodialysis  Consent Risks of the procedure as well as the alternatives and risks of each were explained to the patient and/or caregiver.  Consent for the procedure was obtained and is signed in the bedside chart  Anesthesia Topical only with 1% lidocaine   Timeout Verified patient identification, verified procedure, site/side was marked, verified correct patient position, special equipment/implants available, medications/allergies/relevant history reviewed, required imaging and test results available.  Sterile Technique Maximal sterile technique including full sterile barrier drape, hand hygiene, sterile gown, sterile gloves, mask, hair covering, sterile ultrasound probe cover (if used).  Procedure Description Area of catheter insertion was cleaned with chlorhexidine and draped in sterile fashion.   With real-time ultrasound guidance a HD catheter was placed into the right internal jugular vein.  Nonpulsatile blood flow and easy flushing noted in all ports.  The catheter was sutured in place and sterile dressing applied.  Complications/Tolerance None; patient tolerated the procedure well. Chest X-ray is ordered to verify placement for internal jugular or subclavian cannulation.  Chest x-ray is not ordered for femoral cannulation.  EBL Minimal  Specimen(s) None   Otilio Carpen Courage Biglow, PA-C

## 2022-02-11 NOTE — ED Notes (Signed)
Pt is attempting to rest however he is moaning and groaning and will intermittently c/o pain spoke Dr. Kennon Rounds with critical care received order for fentanyl 12.33mg, and advised due to current CBG stop the D10 fluids running at this time

## 2022-02-11 NOTE — Progress Notes (Signed)
PCCM Interval Note  48 yoM admitted early this am with symptomatic bradycardia, anemia, hyperkalemia and CKDV now ESRD.    iHD early this am, NF0, transfused 1u PRBC during iHD Repeat labs pending> K 5.3, sCr 5.32> 4.02, BUN 118> 85, bicarb 20, H/H 7.1/ 21.5, plts 132> 83 Hypoglycemic this am> eating now Remains hemodynamically stable- HR 80-90's, SR Wakes up easily, answer simple questions, follows commands   P:  Plans for iHD again later this afternoon, hopefully some volume removal as BP tolerates.   Serial labs Monitor for hyperkalemia Stop SSI.  Suspect glucose 792> drawn while D10 infusing.  Monitor closely.  FBG and serum CBGs match. Plans for IR [facility] this hospitalization Pending repeat coags given elevated LFTs, consider holding reduced dose eliquis if INR> 2.   No evidence of bleeding.  Anemia seems related chronic 2/2 CKD, monitor closely, transfuse for Hgb<7.   May be able to tx out of ICU after iHD later today.     Additional CCT: 15 mins   Kennieth Rad, MSN, AG-ACNP-BC [facility] 02/11/2022, 10:33 AM  See Amion for pager If no response to pager, please call PCCM consult pager After 7:00 pm call Elink

## 2022-02-11 NOTE — Progress Notes (Signed)
Spouse and son present at bedside. Dialysis started at 22:00.

## 2022-02-11 NOTE — Progress Notes (Signed)
Hypoglycemic Event  CBG: 54  Treatment: 4 oz juice/soda  Symptoms: None  Follow-up CBG: Time:0830 CBG Result:69  Possible Reasons for Event: Inadequate meal intake, gave 2nd glass of juice per provider rec after 2nd low CBG  Comments/MD notified: Dr. Eilene Ghazi

## 2022-02-11 NOTE — Progress Notes (Signed)
Requested to see pt for out-pt HD needs at d/c. Met with pt and pt's son, Mike Craze, at bedside. Introduced self and explained role. Spoke to pt's son regarding pt's HD needs. Son states that family prefers [facility]. Referral made to [facility] admissions for clinic placement. Son voices interest in assistance with transportation options to/from HD. Contacted RN CM to make her aware of son's request for assistance with resources. Will assist as needed.   Melven Sartorius Renal Navigator [phone]

## 2022-02-11 NOTE — Progress Notes (Signed)
[facility] Progress Note Patient Name: Adam Reid DOB: [birth year] MRN: [id number]   Date of Service  02/11/2022  HPI/Events of Note  Multiple issues: 1. Patient c/o urethral meatus pain from Foley catheter. Patient is on chronic pain medication with Dilaudid and Percocet.   eICU Interventions  Plan: Lidocaine jelly 2% apply to urethral meatus.  Oxycodone IR 5 mg PO Q 6 hours PRN pain.      Intervention Category Major Interventions: Other:  Lysle Dingwall 02/11/2022, 10:25 PM

## 2022-02-11 NOTE — Plan of Care (Signed)
  Interdisciplinary Goals of Care Family Meeting   Date carried out:: 02/11/2022  Location of the meeting: Bedside  Member's involved: Bedside Registered Nurse, Family Member or next of kin, and Other: physician assistant  Durable Power of Attorney or acting medical decision maker: Wife    Discussion: We discussed goals of care for Adam Reid .    Discussed code status with patient's wife and son at the bedside, they state currently pt is full code and wants all interventions including intubation and ACLS if necessary.  He has stated in the past that he would not want to be on life support for a prolonged period of time or if there were no reasonable chance of recovery  Code status: Full Code  Disposition: Continue current acute care   Time spent for the meeting: 15 minutes  Adam Reid Adam Reid 02/11/2022, 2:06 AM

## 2022-02-11 NOTE — Progress Notes (Signed)
[facility] Progress Note   32M progressive stage V CKD followed by Dr. 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 Found to be in junctional bradycardia with a heart rate in the 30s, potassium 6.6, QRS prolonged at 150. Tx medically.  Had been hesitant to initiate dialysis but has been willing to receive it when it came to a life or death situation.   Assessment/ Plan:   32M with advanced CKD 5 progressed senting with junctional bradycardia, progressive dyspnea, worsened renal failure, severe anemia. 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 Junctional bradycardia secondary to hyperkalemia while on amiodarone and diltiazem, as above Hyperkalemia with EKG changes upon presentation. Anemia, severe, on ESA therapy as outpatient HFpEF DM2 Hypertension, blood pressure stable S/p RUE AVG placement 02/08/22 VVS [facility]   Plan S/P HD #1 very early this AM; plan on 2nd treatment later today and then VIR for TC Fri. #3 HD on Sat. STAT BMet. Will start the CLIP process He is now ESRD Transfuse as needed; elevated Ferritin and will not be able to give further iron + ESA to be added. Will check phos for binder management + PTH + 25vitD. Will follow closely  Subjective:   Denies f/c/n/v/dyspnea/ CP.   Objective:   BP (!) 136/56   Pulse 88   Temp (!) 96.1 F (35.6 C) (Axillary) Comment: Simultaneous filing. User may not have seen previous data. Comment (Src): Simultaneous filing. User may not have seen previous data.  Resp 17   Ht '5\' 6"'$  (1.676 m)   Wt 54.6 kg   SpO2 92%   BMI 19.43 kg/m   Intake/Output Summary (Last 24 hours) at  02/11/2022 9381 Last data filed at 02/11/2022 0700 Gross per 24 hour  Intake 692.4 ml  Output 150 ml  Net 542.4 ml   Weight change:   Physical Exam: GEN: Unwell appearing,  thin/cachectic ENT: NCAT EYES: EOMI CV: regular rhythm PULM: Appears to be comfortable ABD: Soft, nontender SKIN: No rashes or lesions EXT: 2+ lower extremity edema ACCESS: RUA AVG + thrill    Imaging: CT ABDOMEN PELVIS WO CONTRAST  Result Date: 02/11/2022 CLINICAL DATA:  Abdominal pain, acute, nonlocalized. Left retroperitoneal paraganglioma, chronic kidney disease. EXAM: CT ABDOMEN AND PELVIS WITHOUT CONTRAST TECHNIQUE: Multidetector CT imaging of the abdomen and pelvis was performed following the standard protocol without IV contrast. RADIATION DOSE REDUCTION: This exam was performed according to the departmental dose-optimization program which includes automated exposure control, adjustment of the mA and/or kV according to patient size and/or use of iterative reconstruction technique. COMPARISON:  01/28/2022. FINDINGS: Lower chest: The heart is enlarged and there is no pericardial effusion. Scattered coronary artery calcifications are noted. There are loculated pleural effusions bilaterally with patchy infiltrates. Hepatobiliary: No focal liver abnormality. No biliary ductal dilatation. The gallbladder is not visualized on exam. Pancreas: Unremarkable. No pancreatic ductal dilatation or surrounding inflammatory changes. Spleen: Normal in size without focal abnormality. Adrenals/Urinary Tract: Mild thickening of the adrenal glands with no evidence of discrete nodule. Cysts are noted in the kidneys bilaterally. There are tiny subcentimeter hyperdense structures in the kidneys bilaterally, possible hemorrhagic or proteinaceous cysts. No renal calculus or hydronephrosis. There is a slightly hyperdense lesion in  the upper pole the right kidney measuring 2.4 cm. A Foley catheter is noted in the urinary bladder and the bladder is  nondistended. Stomach/Bowel: Stomach is within normal limits. Appendix is not seen. No evidence of bowel wall thickening, distention, or inflammatory changes. No free air or pneumatosis. A few scattered diverticular present along the colon without evidence of diverticulitis. A moderate amount of retained stool is present in the colon. Vascular/Lymphatic: Aortic atherosclerosis. A stable mass is noted in the retroperitoneum on the left measuring 7.1 x 5.5 cm, compatible with known paraganglioma. No abdominal or pelvic lymphadenopathy. Reproductive: The prostate gland is enlarged. Other: There is a small left inguinal hernia containing nonobstructed bowel. Mesenteric edema is noted. No abdominopelvic ascites. Musculoskeletal: Degenerative changes are present in the thoracolumbar spine. No acute osseous abnormality. IMPRESSION: 1. No acute intra-abdominal process. 2. Moderate amount of retained stool in the colon suggesting constipation. 3. Loculated pleural effusions bilaterally with strandy atelectasis or infiltrate, not significantly changed from the prior exam. 4. Stable retroperitoneal mass on the left, previously characterized as paraganglioma by history. 5. Bilateral renal cysts. There is a complex lesion in the upper pole of the right kidney measuring 2.4 cm. Ultrasound is recommended for further characterization on follow-up. 6. Aortic atherosclerosis. 7. Remaining incidental findings as described above. Electronically Signed   By: Brett Fairy M.D.   On: 02/11/2022 02:00   DG Chest Portable 1 View  Result Date: 02/11/2022 CLINICAL DATA:  Status post central line placement EXAM: PORTABLE CHEST 1 VIEW COMPARISON:  Chest x-ray from the previous day. FINDINGS: Cardiac shadow is enlarged but stable. New right jugular central line is noted at the cavoatrial junction. Stable effusions are seen right greater than left as well as basilar airspace disease right greater than left. No pneumothorax is seen.  IMPRESSION: No pneumothorax following right jugular central line placement Remainder of the exam is stable. Electronically Signed   By: Inez Catalina M.D.   On: 02/11/2022 00:28   DG Chest Port 1 View  Result Date: 02/15/2022 CLINICAL DATA:  Weakness. EXAM: PORTABLE CHEST 1 VIEW COMPARISON:  December 13, 2021 FINDINGS: The heart size and mediastinal contours are within normal limits. There is calcification of the aortic arch. Persistent marked severity multifocal airspace disease is seen within the mid and lower lungs. Stable bilateral pleural effusions are also noted, right greater than left. No pneumothorax is identified. The visualized skeletal structures are unremarkable. IMPRESSION: 1. Persistent marked severity multifocal airspace disease with stable bilateral pleural effusions, right greater than left. Electronically Signed   By: Virgina Norfolk M.D.   On: 03/06/2022 21:45    Labs: BMET Recent Labs  Lab 02/08/22 0811 02/27/2022 2130 02/16/2022 2140 02/11/22 0000 02/11/22 0250  NA 136 140 135  135 127*  --   K 4.5 6.9* 6.6*  6.6* 5.8* 6.7*  CL 101 100 104 91*  --   CO2  --  18*  --  14*  --   GLUCOSE 148* 63* 59* 792* 97  BUN 129* 122* >130* 118*  --   CREATININE 5.00* 5.57* 6.00* 5.32*  --   CALCIUM  --  8.8*  --  8.5*  --    CBC Recent Labs  Lab 02/08/22 0811 02/15/2022 1256 02/14/2022 2130 03/02/2022 2140 02/11/22 0000  WBC  --   --  5.1  --  7.5  NEUTROABS  --   --  4.4  --   --   HGB 8.2* 6.8* 6.8* 6.5*  6.8* 6.1*  HCT 24.0*  --  21.5* 19.0*  20.0* 20.5*  MCV  --   --  92.3  --  98.1  PLT  --   --  170  --  132*    Medications:     sodium chloride   Intravenous Once   apixaban  2.5 mg Oral BID   Chlorhexidine Gluconate Cloth  6 each Topical Q0600   Chlorhexidine Gluconate Cloth  6 each Topical Q0600   insulin aspart  0-6 Units Subcutaneous Q4H   sodium chloride flush  10-40 mL Intracatheter Q12H      Otelia Santee, MD 02/11/2022, 7:42 AM

## 2022-02-11 NOTE — Progress Notes (Addendum)
[facility]Link Physician-Brief Progress Note Patient Name: Adam Reid DOB: 1945/12/01 MRN: [id number]   Date of Service  02/11/2022  HPI/Events of Note  CT scan report reviewed. Soft blood pressures noted.  eICU Interventions  Peripheral  Norepinephrine gtt ordered to keep MAP > 65. Patient is about to commence with dialysis.        Kerry Kass Lynnleigh Soden 02/11/2022, 4:25 AM

## 2022-02-11 NOTE — Progress Notes (Addendum)
[facility] Physician-Brief Progress Note Patient Name: Adam Reid DOB: Oct 28, 1945 MRN: [id number]   Date of Service  02/11/2022  HPI/Events of Note  Patient admitted with AKI superimposed on CKD, hyperkalemia, bradycardia, anion gap metabolic acidosis, and abnormal LFT's, work up is in progress.  eICU Interventions  New Patient Evaluation.        [name] 02/11/2022, 2:18 AM

## 2022-02-11 NOTE — H&P (Signed)
NAME:  Adam Reid, MRN:  [id number], DOB:  Oct 09, 1945, LOS: 0 ADMISSION DATE:  03/10/2022, CONSULTATION DATE:  02/11/22 REFERRING MD:  [name], CHIEF COMPLAINT:  weakness, bradycardia   History of Present Illness:  Adam Reid is a 76 y.o. 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  His son took his HR and it was in the 30's so presented to the ED where he had a junctional rhythm and K of 6.6 with prolonged QRS of 150.  Pt's son is at the bedside and reports that he has also been complaining of generalized abdominal pain that has been worsening. He is chronically constipated, no diarrhea or fever.  He was given calcium and nebulizer treatments and seen by nephrology who recommended immediate initiation of dialysis.  PCCM consulted for admission  Pertinent  Medical History   has a past medical history of Anemia, Anxiety, BPH (benign prostatic hyperplasia), Cancer ([place]), CHF (congestive heart failure) ([place]), Chronic kidney disease, Depression, Diabetes mellitus without complication ([place]), Dyspnea, ESRD (end stage renal disease) ([place]), GERD (gastroesophageal reflux disease), Heart murmur, History of blood transfusion, Hyperlipidemia, Hypertension, PAF (paroxysmal atrial fibrillation) ([place]), and Thyroid disease.   Significant Hospital Events: Including procedures, antibiotic start and stop dates in addition to other pertinent events   8/3 Presented with weakness, hyperkalemia and bradycardia, admit to PCCM  Interim History / Subjective:  Pt remains awake, restless, groaning, borderline low BP  Objective   Blood pressure (!) 114/48, pulse (!) 34, resp. rate (!) 22, height $RemoveBe'5\' 6"'[id number]$  (1.676 m), weight 52.2 kg, SpO2 97 %.        Intake/Output Summary (Last 24 hours) at 02/11/2022 0029 Last data filed at 02/11/2022 0009 Gross per 24 hour  Intake  93.64 ml  Output --  Net 93.64 ml   Filed Weights   02/11/22 0002  Weight: 52.2 kg    General:  thin, chronically ill-appearing M, restess HEENT: MM pink/moist, sclera mildly anicteric Neuro: awake, answering questions, son interpreting and states that he is oriented CV: s1s2 bradycardic, no m/r/g PULM:  mildly tachypneic, no accessory muscle use GI: soft, non-distended, diffusely TTP  Extremities: warm/cool, 2+ pedal edema  Skin: no rashes or lesions   Resolved Hospital Problem list     Assessment & Plan:   Acutely worsening renal failure superimposed on CKD stage V Hyperkalemia Volume Overload -seen by nephrology, HD catheter placed and plan to start dialysis as soon as possible -received Ca and nebulizers, insulin held 2/2 hypoglycemia -K improved to 5.8 -admit to ICU for close monitoring -monitor electrolytes  Bradycardia History of HFpEF Atrial Fibrillation Likely secondary to hyperkalemia -monitor on telemetry -may need pressors -check echo -hold home amio, diltiazem, Cardura, torsemide secondary to low BP -continue Xarelto  Elevated Transaminases Abdominal pain Likely congestive hepatopathy -trend, supportive care -Check CT abd/pelvis -hepatitis panel pending  Type 2 DM -initially hypoglycemic now hyperglycemic -SSI, q4hr monitoring  AGMA -lilkely secondary to renal failure -trend BMP  Anemia, acute on chronic No evidence of bleeding -plan to transfuse 1 unit during dialysis -follow CBC   Best Practice (right click and "Reselect all SmartList Selections" daily)   Diet/type: Regular consistency (see orders) DVT prophylaxis: DOAC GI prophylaxis: PPI Lines: N/A Foley:  Yes, and it is still needed Code Status:  full code Last date of multidisciplinary goals of care discussion [discussed code  status and family updated at the bedside]  Labs   CBC: Recent Labs  Lab 02/08/22 0811 02/24/2022 2130 02/20/2022 2140  WBC  --  5.1  --   NEUTROABS   --  4.4  --   HGB 8.2* 6.8* 6.5*  6.8*  HCT 24.0* 21.5* 19.0*  20.0*  MCV  --  92.3  --   PLT  --  170  --     Basic Metabolic Panel: Recent Labs  Lab 02/08/22 0811 02/19/2022 2130 03/11/2022 2140  NA 136 140 135  135  K 4.5 6.9* 6.6*  6.6*  CL 101 100 104  CO2  --  18*  --   GLUCOSE 148* 63* 59*  BUN 129* 122* >130*  CREATININE 5.00* 5.57* 6.00*  CALCIUM  --  8.8*  --   MG  --  2.9*  --    GFR: Estimated Creatinine Clearance: 8 mL/min (A) (by C-G formula based on SCr of 6 mg/dL (H)). Recent Labs  Lab 02/26/2022 2130  WBC 5.1    Liver Function Tests: Recent Labs  Lab 02/16/2022 2130  AST 1,537*  ALT 743*  ALKPHOS 1,966*  BILITOT 1.2  PROT 6.6  ALBUMIN 2.9*   No results for input(s): "LIPASE", "AMYLASE" in the last 168 hours. No results for input(s): "AMMONIA" in the last 168 hours.  ABG    Component Value Date/Time   HCO3 18.6 (L) 02/11/2022 2140   TCO2 20 (L) 03/10/2022 2140   TCO2 20 (L) 03/10/2022 2140   ACIDBASEDEF 8.0 (H) 02/28/2022 2140   O2SAT 89 03/08/2022 2140     Coagulation Profile: Recent Labs  Lab 02/23/2022 2130  INR 1.3*    Cardiac Enzymes: No results for input(s): "CKTOTAL", "CKMB", "CKMBINDEX", "TROPONINI" in the last 168 hours.  HbA1C: Hgb A1c MFr Bld  Date/Time Value Ref Range Status  11/23/2021 03:00 PM 7.4 (H) 4.6 - 6.5 % Final    Comment:    Glycemic Control Guidelines for People with Diabetes:Non Diabetic:  <6%Goal of Therapy: <7%Additional Action Suggested:  >8%   08/27/2021 02:57 PM 7.9 (H) 4.6 - 6.5 % Final    Comment:    Glycemic Control Guidelines for People with Diabetes:Non Diabetic:  <6%Goal of Therapy: <7%Additional Action Suggested:  >8%     CBG: Recent Labs  Lab 02/08/22 0721 02/08/22 1011 02/08/22 1156 02/20/2022 2159 02/14/2022 2258  GLUCAP 158* 156* 168* 34* 135*    Review of Systems:   Please see the history of present illness. All other systems reviewed and are negative    Past Medical History:   He,  has a past medical history of Anemia, Anxiety, BPH (benign prostatic hyperplasia), Cancer ([place]), CHF (congestive heart failure) ([place]), Chronic kidney disease, Depression, Diabetes mellitus without complication ([place]), Dyspnea, ESRD (end stage renal disease) ([place]), GERD (gastroesophageal reflux disease), Heart murmur, History of blood transfusion, Hyperlipidemia, Hypertension, PAF (paroxysmal atrial fibrillation) ([place]), and Thyroid disease.   Surgical History:   Past Surgical History:  Procedure Laterality Date   A/V FISTULAGRAM Right 01/29/2022   Procedure: A/V Fistulagram;  Surgeon: [name], MD;  Location: [facility];  Service: Cardiovascular;  Laterality: Right;   APPENDECTOMY     AV FISTULA PLACEMENT Right 09/04/2021   Procedure: RIGHT BRACHIOCEPHALIC ARTERIOVENOUS (AV) FISTULA CREATION;  Surgeon: [name], MD;  Location: [facility];  Service: Vascular;  Laterality: Right;   [place] Right 02/08/2022   Procedure: RIGHT UPPER ARM GORE-TEX GRAFT INSERTION USING 4-15mm STRETCH  GORETEX GRAFT;  Surgeon: [name], MD;  Location: [facility];  Service: Vascular;  Laterality: Right;   BIOPSY  12/25/2020   Procedure: BIOPSY;  Surgeon: [name]., MD;  Location: [facility];  Service: Gastroenterology;;   CARDIAC CATHETERIZATION N/A 01/30/2016   Procedure: Left Heart Cath and Coronary Angiography;  Surgeon: [name], MD;  Location: [facility];  Service: Cardiovascular;  Laterality: N/A;   ENTEROSCOPY N/A 12/25/2020   Procedure: ENTEROSCOPY;  Surgeon: [name], MD;  Location: [facility];  Service: Gastroenterology;  Laterality: N/A;   GIVENS CAPSULE STUDY N/A 12/25/2020   Procedure: GIVENS CAPSULE STUDY;  Surgeon: [name]., MD;  Location: [facility];  Service: Gastroenterology;  Laterality: N/A;   SUBMUCOSAL TATTOO INJECTION  12/25/2020   Procedure: SUBMUCOSAL TATTOO INJECTION;   Surgeon: [name], MD;  Location: [facility];  Service: Gastroenterology;;     Social History:   reports that he has been smoking cigarettes. He has a 4.80 pack-year smoking history. He has never used smokeless tobacco. He reports that he does not drink alcohol and does not use drugs.   Family History:  His family history includes Heart disease (age of onset: 86) in his brother; Hyperlipidemia in his mother; Hypertension in his mother and sister; Kidney disease in his father. There is no history of Colon cancer.   Allergies Allergies  Allergen Reactions   Pork-Derived Products Other (See Comments)    Patient "just does not eat this"     Home Medications  Prior to Admission medications   Medication Sig Start Date End Date Taking? Authorizing Provider  amiodarone (PACERONE) 200 MG tablet Take 1 tablet (200 mg total) by mouth daily. Follow up appt required for further refills. Please call [phone] to schedule an appt Patient taking differently: Take 200 mg by mouth daily. 11/30/21   Clegg, Amy D, NP  atorvastatin (LIPITOR) 80 MG tablet TAKE 1 TABLET(80 MG) BY MOUTH DAILY Patient taking differently: Take 80 mg by mouth daily. 12/22/20   [name], MD  bisacodyl (DULCOLAX) 10 MG suppository Place 1 suppository (10 mg total) rectally as needed for moderate constipation. 12/31/21   [name], MD  blood glucose meter kit and supplies Use up to two times daily as directed  ICD10 E10.9 E11.9 01/18/22   [name], MD  blood glucose meter kit and supplies Dispense based on patient and insurance preference. Use up to four times daily as directed. (FOR ICD-10 E10.9, E11.9). 02/08/22   [name], MD  CALPHRON 667 MG tablet Take 1,334 mg by mouth 3 (three) times daily with meals. 11/09/21   [provider]  Cholecalciferol (VITAMIN D3 PO) Take 1 tablet by mouth daily.    [provider]  Continuous Blood Gluc Receiver (FREESTYLE LIBRE 2  READER) DEVI 1 Application by Does not apply route every 14 (fourteen) days. 02/08/22   [name], MD  diltiazem (CARDIZEM CD) 180 MG 24 hr capsule TAKE 1 CAPSULE(180 MG) BY MOUTH DAILY Patient taking differently: Take 180 mg by mouth daily. 08/24/21   Bensimhon, Shaune Pascal, MD  doxazosin (CARDURA) 4 MG tablet Take 1 tablet (4 mg total) by mouth daily. 12/24/21 01/23/22  Shahmehdi, Valeria Batman, MD  ELIQUIS 2.5 MG TABS tablet TAKE 1 TABLET(2.5 MG) BY MOUTH TWICE DAILY Patient taking differently: Take 2.5 mg by mouth 2 (two) times daily. 10/30/21   Bensimhon, Shaune Pascal, MD  GlucoCom Lancets MISC Use for home  glucose monitoring 09/02/15   Harrison Mons, PA  glucose blood (ONETOUCH VERIO) test strip Test up to 2 times per day.  Uncontrolled diabetes with hyperglycemia and stage 4 CKD. 04/13/19   [name], MD  HYDROmorphone (DILAUDID) 2 MG tablet Take 0.5 tablets (1 mg total) by mouth every 12 (twelve) hours as needed for moderate pain. 12/31/21   [name], MD  insulin glargine (LANTUS) 100 UNIT/ML Solostar Pen Inject 5 Units into the skin daily. Patient taking differently: Inject 5 Units into the skin every morning. 01/26/22   [name], MD  Insulin Pen Needle 31G X 5 MM MISC Use as directed 12/31/21   [name], MD  lactulose (CHRONULAC) 10 GM/15ML solution Take 15 mLs (10 g total) by mouth 2 (two) times daily. Patient taking differently: Take 10 g by mouth 2 (two) times daily as needed for moderate constipation. 12/31/21   [name], MD  oxyCODONE-acetaminophen (PERCOCET) 5-325 MG tablet Take 1 tablet by mouth every 4 (four) hours as needed for severe pain. 02/08/22 02/08/23  [name], MD  OXYGEN Inhale 2.5 L into the lungs continuous.    [provider]  sitaGLIPtin (JANUVIA) 25 MG tablet TAKE 1 TABLET(25 MG) BY MOUTH DAILY Patient taking differently: Take 25 mg by mouth daily. 08/31/21   [name], MD  torsemide (DEMADEX) 20 MG  tablet Take 2 tablets (40 mg total) by mouth 2 (two) times daily. 10/05/21   [place], Maricela Bo, FNP  vitamin B-12 (CYANOCOBALAMIN) 500 MCG tablet Take 500 mcg by mouth daily.    [provider]     Critical care time:  45 minutes      CRITICAL CARE Performed by: Otilio Carpen Virgilene Stryker   Total critical care time: 45 minutes  Critical care time was exclusive of separately billable procedures and treating other patients.  Critical care was necessary to treat or prevent imminent or life-threatening deterioration.  Critical care was time spent personally by me on the following activities: development of treatment plan with patient and/or surrogate as well as nursing, discussions with consultants, evaluation of patient's response to treatment, examination of patient, obtaining history from patient or surrogate, ordering and performing treatments and interventions, ordering and review of laboratory studies, ordering and review of radiographic studies, pulse oximetry and re-evaluation of patient's condition.  Otilio Carpen Aala Ransom, PA-C [facility] See Amion for pager If no response to pager , please call [phone] until 7pm After 7:00 pm call Elink  [phone]?[place]

## 2022-02-11 NOTE — Progress Notes (Signed)
Interpreter utilized to complete assessment.

## 2022-02-11 NOTE — Progress Notes (Signed)
Initial Nutrition Assessment  DOCUMENTATION CODES:   Severe malnutrition in context of chronic illness  INTERVENTION:   Nepro Shake po TID, each supplement provides 425 kcal and 19 grams protein.  Renal MVI daily.  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  GOAL:   Patient will meet greater than or equal to 90% of their needs  MONITOR:   PO intake, Supplement acceptance, Labs  REASON FOR ASSESSMENT:   Rounds    ASSESSMENT:   76 yo male admitted with symptomatic bradycardia, anemia, hyperkalemia. PMH includes CKD 5, HTN, DM-2, HLD, CHF, GERD, PAF.  Discussed patient in ICU rounds and with RN today. Patient has been eating poorly and glucose was low this AM. Patient is now ESRD, requiring HD on admission and again later today. Spoke with patient's son at bedside. Patient has been losing weight for the past 3 years, 30 lbs in the past 6 months. Intake at home has been poor for a long time. Patient has had PO supplements in the past, but does not drink them regularly. He typically only drinks Coke and orange juice.   Labs reviewed. K 5.3 CBG: [phone]  Medications reviewed and include Aranesp, Novolog SSI q 4 hours.  Weight history reviewed. Patient has had 11% weight loss within the past 6 months. Could be partially related to fluids, however, patient has severe muscle and fat depletion. Patient meets criteria for severe malnutrition, given severe depletion of muscle and subcutaneous fat mass.  NUTRITION - FOCUSED PHYSICAL EXAM:  Flowsheet Row Most Recent Value  Orbital Region Severe depletion  Upper Arm Region Severe depletion  Thoracic and Lumbar Region Severe depletion  Buccal Region Severe depletion  Temple Region Severe depletion  Clavicle Bone Region Severe depletion  Clavicle and Acromion Bone Region Severe depletion  Scapular Bone Region Severe  depletion  Dorsal Hand Severe depletion  Patellar Region Severe depletion  Anterior Thigh Region Severe depletion  Posterior Calf Region Severe depletion  Edema (RD Assessment) Severe  Hair Reviewed  Eyes Reviewed  Mouth Reviewed  Skin Reviewed  Nails Reviewed       Diet Order:   Diet Order             Diet NPO time specified Except for: Sips with Meds  Diet effective midnight           Diet 2 gram sodium Room service appropriate? Yes; Fluid consistency: Thin  Diet effective now                   EDUCATION NEEDS:   Not appropriate for education at this time  Skin:  Skin Assessment: Reviewed RN Assessment  Last BM:  no BM documented  Height:   Ht Readings from Last 1 Encounters:  02/11/22 '5\' 6"'$  (1.676 m)    Weight:   Wt Readings from Last 1 Encounters:  02/11/22 54.6 kg    Ideal Body Weight:  64.5 kg  BMI:  Body mass index is 19.43 kg/m.  Estimated Nutritional Needs:   Kcal:  1900-2200  Protein:  85-100 gm  Fluid:  1 L + UOP   Lucas Mallow RD, LDN, CNSC Please refer to Amion for contact information.

## 2022-02-12 ENCOUNTER — Encounter (HOSPITAL_COMMUNITY): Payer: Self-pay | Admitting: Anesthesiology

## 2022-02-12 ENCOUNTER — Encounter (HOSPITAL_COMMUNITY): Payer: Self-pay | Admitting: Critical Care Medicine

## 2022-02-12 DIAGNOSIS — N179 Acute kidney failure, unspecified: Secondary | ICD-10-CM

## 2022-02-12 DIAGNOSIS — K920 Hematemesis: Secondary | ICD-10-CM | POA: Diagnosis not present

## 2022-02-12 DIAGNOSIS — N185 Chronic kidney disease, stage 5: Secondary | ICD-10-CM

## 2022-02-12 LAB — CBC
HCT: 18.6 % — ABNORMAL LOW (ref 39.0–52.0)
HCT: 22.6 % — ABNORMAL LOW (ref 39.0–52.0)
Hemoglobin: 6.4 g/dL — CL (ref 13.0–17.0)
Hemoglobin: 7.5 g/dL — ABNORMAL LOW (ref 13.0–17.0)
MCH: 30 pg (ref 26.0–34.0)
MCH: 29.3 pg (ref 26.0–34.0)
MCHC: 34.4 g/dL (ref 30.0–36.0)
MCHC: 33.2 g/dL (ref 30.0–36.0)
MCV: 87.3 fL (ref 80.0–100.0)
MCV: 88.3 fL (ref 80.0–100.0)
Platelets: 39 10*3/uL — ABNORMAL LOW (ref 150–400)
Platelets: 44 10*3/uL — ABNORMAL LOW (ref 150–400)
RBC: 2.13 MIL/uL — ABNORMAL LOW (ref 4.22–5.81)
RBC: 2.56 MIL/uL — ABNORMAL LOW (ref 4.22–5.81)
RDW: 15.7 % — ABNORMAL HIGH (ref 11.5–15.5)
RDW: 15.6 % — ABNORMAL HIGH (ref 11.5–15.5)
WBC: 9.8 10*3/uL (ref 4.0–10.5)
WBC: 10.9 10*3/uL — ABNORMAL HIGH (ref 4.0–10.5)
nRBC: 0.2 % (ref 0.0–0.2)
nRBC: 1.2 % — ABNORMAL HIGH (ref 0.0–0.2)

## 2022-02-12 LAB — GLUCOSE, CAPILLARY
Glucose-Capillary: 120 mg/dL — ABNORMAL HIGH (ref 70–99)
Glucose-Capillary: 107 mg/dL — ABNORMAL HIGH (ref 70–99)
Glucose-Capillary: 116 mg/dL — ABNORMAL HIGH (ref 70–99)
Glucose-Capillary: 116 mg/dL — ABNORMAL HIGH (ref 70–99)
Glucose-Capillary: 115 mg/dL — ABNORMAL HIGH (ref 70–99)
Glucose-Capillary: 113 mg/dL — ABNORMAL HIGH (ref 70–99)

## 2022-02-12 LAB — RENAL FUNCTION PANEL
Albumin: 2.5 g/dL — ABNORMAL LOW (ref 3.5–5.0)
Anion gap: 14 (ref 5–15)
BUN: 55 mg/dL — ABNORMAL HIGH (ref 8–23)
CO2: 26 mmol/L (ref 22–32)
Calcium: 8.1 mg/dL — ABNORMAL LOW (ref 8.9–10.3)
Chloride: 93 mmol/L — ABNORMAL LOW (ref 98–111)
Creatinine, Ser: 2.62 mg/dL — ABNORMAL HIGH (ref 0.61–1.24)
GFR, Estimated: 25 mL/min — ABNORMAL LOW (ref >60)
Glucose, Bld: 121 mg/dL — ABNORMAL HIGH (ref 70–99)
Phosphorus: 6.1 mg/dL — ABNORMAL HIGH (ref 2.5–4.6)
Potassium: 4.1 mmol/L (ref 3.5–5.1)
Sodium: 133 mmol/L — ABNORMAL LOW (ref 135–145)

## 2022-02-12 LAB — HEPATIC FUNCTION PANEL
ALT: 2150 U/L — ABNORMAL HIGH (ref 0–44)
AST: 3070 U/L — ABNORMAL HIGH (ref 15–41)
Albumin: 2.5 g/dL — ABNORMAL LOW (ref 3.5–5.0)
Alkaline Phosphatase: 1318 U/L — ABNORMAL HIGH (ref 38–126)
Bilirubin, Direct: 2 mg/dL — ABNORMAL HIGH (ref 0.0–0.2)
Indirect Bilirubin: 1.2 mg/dL — ABNORMAL HIGH (ref 0.3–0.9)
Total Bilirubin: 3.2 mg/dL — ABNORMAL HIGH (ref 0.3–1.2)
Total Protein: 5.5 g/dL — ABNORMAL LOW (ref 6.5–8.1)

## 2022-02-12 LAB — HEPATITIS B SURFACE ANTIBODY, QUANTITATIVE: Hep B S AB Quant (Post): <3.1 m[IU]/mL — ABNORMAL LOW (ref >9.9)

## 2022-02-12 LAB — PARATHYROID HORMONE, INTACT (NO CA): PTH: 725 pg/mL — ABNORMAL HIGH (ref 15–65)

## 2022-02-12 LAB — PROTIME-INR
INR: 3.1 — ABNORMAL HIGH (ref 0.8–1.2)
Prothrombin Time: 31.8 seconds — ABNORMAL HIGH (ref 11.4–15.2)

## 2022-02-12 LAB — POTASSIUM: Potassium: 3.4 mmol/L — ABNORMAL LOW (ref 3.5–5.1)

## 2022-02-12 LAB — OCCULT BLOOD X 1 CARD TO LAB, STOOL: Fecal Occult Bld: NEGATIVE

## 2022-02-12 LAB — OCCULT BLOOD GASTRIC / DUODENUM (SPECIMEN CUP): Occult Blood, Gastric: POSITIVE — AB

## 2022-02-12 LAB — PREPARE RBC (CROSSMATCH)

## 2022-02-12 MED ORDER — SODIUM CHLORIDE 0.9% IV SOLUTION: Freq: Once | INTRAVENOUS | Status: DC

## 2022-02-12 MED ORDER — ONDANSETRON HCL 4 MG/2ML IJ SOLN
4.0000 mg | Freq: Four times a day (QID) | INTRAMUSCULAR | Status: DC | PRN
  Administered 2022-02-12 – 2022-03-02 (×3): 4 mg via INTRAVENOUS
  Filled 2022-02-12 (×3): qty 2

## 2022-02-12 MED ORDER — OXYMETAZOLINE HCL 0.05 % NA SOLN
1.0000 | Freq: Two times a day (BID) | NASAL | Status: DC
  Administered 2022-02-14 – 2022-02-15 (×2): 1 via NASAL
  Filled 2022-02-12 (×2): qty 30

## 2022-02-12 MED ORDER — PROCHLORPERAZINE EDISYLATE 10 MG/2ML IJ SOLN
10.0000 mg | Freq: Four times a day (QID) | INTRAMUSCULAR | Status: DC | PRN
Start: 2022-02-12 — End: 2022-03-04
  Filled 2022-02-12: qty 2

## 2022-02-12 MED ORDER — DILTIAZEM HCL ER COATED BEADS 180 MG PO CP24
180.0000 mg | ORAL_CAPSULE | Freq: Every day | ORAL | Status: DC
  Administered 2022-02-14 – 2022-03-03 (×17): 180 mg via ORAL
  Filled 2022-02-12 (×20): qty 1

## 2022-02-12 MED ORDER — HYDROMORPHONE HCL 1 MG/ML IJ SOLN
0.5000 mg | INTRAMUSCULAR | Status: DC | PRN
  Administered 2022-02-12 – 2022-03-03 (×13): 0.5 mg via INTRAVENOUS
  Filled 2022-02-12 (×15): qty 0.5

## 2022-02-12 MED ORDER — BISACODYL 10 MG RE SUPP
10.0000 mg | Freq: Once | RECTAL | Status: AC
  Administered 2022-02-12: 10 mg via RECTAL
  Filled 2022-02-12: qty 1

## 2022-02-12 MED ORDER — PANTOPRAZOLE SODIUM 40 MG IV SOLR
40.0000 mg | Freq: Two times a day (BID) | INTRAVENOUS | Status: DC
Start: 2022-02-12 — End: 2022-02-15
  Administered 2022-02-12 – 2022-02-14 (×6): 40 mg via INTRAVENOUS
  Filled 2022-02-12 (×7): qty 10

## 2022-02-12 MED ORDER — AMIODARONE HCL 200 MG PO TABS
200.0000 mg | ORAL_TABLET | Freq: Every day | ORAL | Status: DC
  Filled 2022-02-12: qty 1

## 2022-02-12 MED ORDER — SODIUM CHLORIDE 0.9 % IV SOLN
1.0000 g | INTRAVENOUS | Status: DC
  Administered 2022-02-12 – 2022-02-13 (×2): 1 g via INTRAVENOUS
  Filled 2022-02-12 (×2): qty 10

## 2022-02-12 NOTE — Anesthesia Preprocedure Evaluation (Signed)
Anesthesia Evaluation    Reviewed: Allergy & Precautions, Patient's Chart, lab work & pertinent test results  Airway        Dental   Pulmonary Current Smoker and Patient abstained from smoking.,           Cardiovascular hypertension, +CHF  + dysrhythmias Atrial Fibrillation + Valvular Problems/Murmurs   Echo: 1. Left ventricular ejection fraction, by estimation, is 55 to 60%. The  left ventricle has normal function.  2. Right ventricular systolic function is normal. The right ventricular  size is moderately enlarged. There is moderately elevated pulmonary artery  systolic pressure.  3. Left atrial size was mildly dilated.  4. The mitral valve is normal in structure. Mild mitral valve  regurgitation. No evidence of mitral stenosis.  5. Tricuspid valve regurgitation is moderate.  6. The aortic valve is tricuspid.  7. The inferior vena cava is dilated in size with <50% respiratory  variability, suggesting right atrial pressure of 15 mmHg.    Neuro/Psych PSYCHIATRIC DISORDERS Anxiety Depression negative neurological ROS     GI/Hepatic Neg liver ROS, GERD  ,  Endo/Other  diabetes  Renal/GU ESRF and DialysisRenal disease     Musculoskeletal negative musculoskeletal ROS (+)   Abdominal   Peds  Hematology negative hematology ROS (+)   Anesthesia Other Findings   Reproductive/Obstetrics                             Anesthesia Physical Anesthesia Plan  ASA: 3  Anesthesia Plan: MAC   Post-op Pain Management:    Induction: Intravenous  PONV Risk Score and Plan: 0 and Propofol infusion  Airway Management Planned: Natural Airway and Simple Face Mask  Additional Equipment: None  Intra-op Plan:   Post-operative Plan:   Informed Consent:   Plan Discussed with: CRNA  Anesthesia Plan Comments:         Anesthesia Quick Evaluation

## 2022-02-12 NOTE — Progress Notes (Signed)
patient in ICU bed 3M10.  Alert and oriented.  Informed consent signed and in  chart.   Treatment initiated: 2200 Treatment completed: 0100  Patient tolerated well.  Transported back to the room  alert, without acute distress.  Hand-off given to patient's nurse.   Access used: CATH Access issues: NONE  Total UF removed: 1000ML Medication(s) given: NONE Post HD VS: 157/61, 86, 13, 94 Post HD weight: 54 KG   [facility]

## 2022-02-12 NOTE — Consult Note (Addendum)
Referring Provider: Dr. Heath Lark Primary Care Physician:  [name], MD Primary Gastroenterologist:  Dr. Silverio Decamp  Reason for Consultation:  Anemia, coffee ground emesis   HPI: Adam Reid is a 76 y.o. 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  He presented to the ED 02/09/2022 due to generalized weakness in setting of worsening CKD and anemia. He was started on hemodialysis 02/11/2022 after temporary dialysis catheter was placed for urgent dialysis secondary to hyperkalemia with bradycardia.  His hemoglobin level was 6.1 and he was transfused 1 unit of PRBC, posttransfusion hemoglobin is 7.1 then dropped down to 6.4 this morning. One unit of PRBCs transfusing at this time.  On low-dose norepinephrine gtt. He developed coffee-ground emesis x 2 this morning.  He had a mild episode of epistaxis as well.  Eliquis was held and a GI consult was requested for further evaluation and possible endoscopy.   He speaks [place], his son who is a physician is present to interpret.  He has infrequent heartburn.  No dysphagia.  No upper or lower abdominal pain but often has abdominal bloat.  He has chronic constipation and can go up to 2 weeks without passing a bowel movement.  He is taken various laxatives including MiraLAX, lactulose and stimulant laxatives without significant improvement.  He recently took a few enemas which resulted in passing brown nonbloody stool.  No rectal bleeding or melena.  He has chronic nausea which is thought to be related to his progressive CKD.  Today is the first time he had hematemesis.  He is on Eliquis which was discontinued today.  No ASA or  NSAIDs.  No alcohol use.  He smokes cigarettes.  He has a history of chronic iron deficiency anemia for which he underwent an extensive endoscopic evaluation.  He underwent an EGD and colonoscopy 11/20/2019, the EGD identified a submucosal mid esophageal lesion (biopsies consistent with intestinal metaplasia without malignancy), mild chronic gastritis without evidence of H. pylori and peptic duodenitis.  The colonoscopy identified 3 tubular adenomatous polyps which were removed from the colon.  He underwent a small bowel enteroscopy 12/25/2020 which showed a 3 cm hiatal hernia, submucosal nodule found in the esophagus, and a normal stomach, duodenum and jejunum.  A small bowel capsule endoscopy was placed at that time which showed a small amount of heme in the esophagus from prior biopsies, a small leading AVM at 2 hours and a possible AVM at 3 hours and 23 minutes and a few red spots distally which were at her reach for typical enteroscopy and would require DBE for further evaluation.  Admission labs showed acute transaminitis.  Total bili 1.2.  Alk phos 1966.  AST 1537.  ALT 743.  Hepatitis B surface antigen nonreactive.  Hepatitis B surface antibody nonreactive.  Hep B core total antibody nonreactive.  Hep C antibody nonreactive.  Iron 43.  Acetaminophen level less than 10.  INR 1.3.  Labs 02/11/2022: Total bili 2.6.  Alk phos 1645.  AST 9636.  ALT 3686. Labs 02/12/2022: Total bili 3.2.  Alk phos 1318.  AST 3070.  ALT 2150.  CTAP 02/11/2022 showed a moderate amount of retained stool in the colon, loculated pleural effusions, stable left retroperitoneal mass and bilateral renal cysts.   PAST  GI PROCEDURES:  EGD 11/20/2019 by Dr. Lyndel Safe: Z-line irregular, 40 cm from the incisors. Biopsied. - Incidental submucosal mid esophageal lesion - Otherwise normal EGD. -3 year recall  Colonoscopy 11/20/2019: -Colonic polyps s/p polypectomy. -Mild predominantly sigmoid diverticulosis. -Non-bleeding internal  hemorrhoids. -Otherwise grossly normal colonoscopy to TI. -3 year recall  1. Surgical [P], duodenal bulb, 2nd portion of duodenum an distal duodenum - PEPTIC DUODENITIS - NO DYSPLASIA OR MALIGNANCY IDENTIFIED 2. 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 PYLORI OR INTESTINAL METAPLASIA IDENTIFIED - SEE COMMENT 3. Surgical [P], distal esophagus - SQUAMOUS AND GLANDULAR EPITHELIUM WITH ACUTE AND CHRONIC INFLAMMATION - INTESTINAL METAPLASIA PRESENT - NO DYSPLASIA OR MALIGNANCY IDENTIFIED - SEE COMMENT 4. Surgical [P], colon, ascending, distal transverse, sigmoid, polyp (3) - TUBULAR ADENOMA (3 OF 5 FRAGMENTS) - BENIGN COLONIC MUCOSA (2 OF 5 FRAGMENTS) - NO HIGH GRADE DYSPLASIA OR MALIGNANCY IDENTIFIED 5. Surgical [P], colon, rectum, polyp - TUBULAR ADENOMA (1 OF 1 FRAGMENTS) - NO HIGH GRADE DYSPLASIA OR MALIGNANCY IDENTIFIED  Small bowel enteroscopy 12/25/2020: - Submucosal nodule found in the esophagus. - No gross lesions in mid-esophagus. - Salmon-colored mucosa island distally. Biopsied. - Z-line irregular, 40 cm from the incisors. - 3 cm hiatal hernia. - No gross lesions in the stomach. - Normal mucosa was found in the entire examined duodenum. - Normal mucosa was found in the visualized proximal jejunum. Tattooed distal extent. - Successful completion of the Video Capsule Enteroscope placement Gastroesophageal mucosa with intestinal metaplasia consistent with  Barrett's esophagus.  - No dysplasia or carcinoma.   Small bowel capsule endoscopy 12/21/2020: Small amount of heme in esophagus from biopsies Tattoo at 18-minute mark. Small AVM nonbleeding at 2 hours 22 minutes Possible AVM at 3 hours 23 minutes nonbleeding There are few red spots, not clear, AVMs noted to 22, 320, 323, 434 brought her out of reach of typical enteroscopy if they truly are AVMs. would require DBE, consider referral to [facility].  Colonoscopy 07/22/2015  by Dr. Silverio Decamp: 1. Semi-pedunculated polyp ranging from 12 to 50m in size was found in the sigmoid colon; polypectomies were performed using snare cautery 2. 5-6 mm polyp in the ascending colon removed by cold snare, 2-3 mm polyp in the ascending colon removed by biopsy forceps 3. Moderate sized internal hemorrhoids  Echo 02/11/2022: Left ventricular ejection fraction, by estimation, is 55 to 60%. The left ventricle has normal function. 1. Right ventricular systolic function is normal. The right ventricular size is moderately enlarged. There is moderately elevated pulmonary artery systolic pressure. 2. 3. Left atrial size was mildly dilated. The mitral valve is normal in structure. Mild mitral valve regurgitation. No evidence of mitral stenosis. 4. 5. Tricuspid valve regurgitation is moderate. 6. The aortic valve is tricuspid. The inferior vena cava is dilated in size with <50% respiratory variability, suggesting right atrial pressure of 15 mmHg.  Past Medical History:  Diagnosis Date   Anemia    Anxiety    BPH (benign prostatic hyperplasia)    Cancer (HCC)    CHF (congestive heart failure) (HCC)    Chronic kidney disease    Depression    Diabetes mellitus without complication (HCC)    Type II   Dyspnea    ESRD (end stage renal disease) (HCC)    GERD (gastroesophageal reflux disease)    Heart murmur    History of blood transfusion    Hyperlipidemia    Hypertension    PAF (paroxysmal atrial fibrillation) (  [place])    Thyroid disease    was on supplement, taken off by [name]    Past Surgical History:  Procedure Laterality Date   A/V FISTULAGRAM Right 01/29/2022   Procedure: A/V Fistulagram;  Surgeon: [name], MD;  Location: [place] [facility];  Service: Cardiovascular;  Laterality: Right;   APPENDECTOMY     AV FISTULA PLACEMENT Right 09/04/2021   Procedure: RIGHT BRACHIOCEPHALIC ARTERIOVENOUS (AV) FISTULA CREATION;  Surgeon: [name], MD;   Location: [place];  Service: Vascular;  Laterality: Right;   [place] Right 02/08/2022   Procedure: RIGHT UPPER ARM GORE-TEX GRAFT INSERTION USING 4-34m STRETCH GORETEX GRAFT;  Surgeon: DAngelia Mould MD;  [place]  Service: Vascular;  Laterality: Right;   BIOPSY  12/25/2020   Procedure: BIOPSY;  Surgeon: MIrving Copas, MD;  [place]  Service: Gastroenterology;;   CARDIAC CATHETERIZATION N/A 01/30/2016   Procedure: Left Heart Cath and Coronary Angiography;  Surgeon: JJettie Booze MD;  [place] [facility];  Service: Cardiovascular;  Laterality: N/A;   ENTEROSCOPY N/A 12/25/2020   Procedure: ENTEROSCOPY;  Surgeon: MRush LandmarkGTelford Nab, MD;  [place]  Service: Gastroenterology;  Laterality: N/A;   GIVENS CAPSULE STUDY N/A 12/25/2020   Procedure: GIVENS CAPSULE STUDY;  Surgeon: MIrving Copas, MD;  [place]  Service: Gastroenterology;  Laterality: N/A;   SUBMUCOSAL TATTOO INJECTION  12/25/2020   Procedure: SUBMUCOSAL TATTOO INJECTION;  Surgeon: MRush LandmarkGTelford Nab, MD;  [place]  Service: Gastroenterology;;    Prior to Admission medications   Medication Sig Start Date End Date Taking? Authorizing Provider  amiodarone (PACERONE) 200 MG tablet Take 1 tablet (200 mg total) by mouth daily. Follow up appt required for further refills. Please call [phone] schedule an appt Patient taking differently: Take 200 mg by mouth daily. 11/30/21   Clegg, Amy D, NP  atorvastatin (LIPITOR) 80 MG tablet TAKE 1 TABLET(80 MG) BY MOUTH DAILY Patient taking differently: Take 80 mg by mouth daily. 12/22/20   GWendie Agreste MD  bisacodyl (DULCOLAX) 10 MG suppository Place 1 suppository (10 mg total) rectally as needed for moderate constipation. 12/31/21   GSamuella Cota MD  blood glucose meter kit and supplies Use up to two times daily as directed  ICD10 E10.9 E11.9 01/18/22    GWendie Agreste MD  blood glucose meter kit and supplies Dispense based on patient and insurance preference. Use up to four times daily as directed. (FOR ICD-10 E10.9, E11.9). 02/08/22   GWendie Agreste MD  CALPHRON 667 MG tablet Take 1,334 mg by mouth 3 (three) times daily with meals. 11/09/21   [provider]  Continuous Blood Gluc Receiver (FREESTYLE LIBRE 2 READER) DEVI 1 Application by Does not apply route every 14 (fourteen) days. 02/08/22   GWendie Agreste MD  diltiazem (CARDIZEM CD) 180 MG 24 hr capsule TAKE 1 CAPSULE(180 MG) BY MOUTH DAILY Patient taking differently: Take 180 mg by mouth daily. 08/24/21   Bensimhon, DShaune Pascal MD  doxazosin (CARDURA) 4 MG tablet Take 1 tablet (4 mg total) by mouth daily. 12/24/21 01/23/22  Shahmehdi, SValeria Batman MD  ELIQUIS 2.5 MG TABS tablet TAKE 1 TABLET(2.5 MG) BY MOUTH TWICE DAILY Patient taking differently: Take 2.5 mg by mouth 2 (two) times daily. 10/30/21   Bensimhon, DShaune Pascal MD  GlucoCom Lancets MISC Use for home glucose monitoring 09/02/15   JHarrison Mons PA  glucose blood (ONETOUCH VERIO)  test strip Test up to 2 times per day.  Uncontrolled diabetes with hyperglycemia and stage 4 CKD. 04/13/19   [name], MD  HYDROmorphone (DILAUDID) 2 MG tablet Take 0.5 tablets (1 mg total) by mouth every 12 (twelve) hours as needed for moderate pain. 12/31/21   [name], MD  insulin glargine (LANTUS) 100 UNIT/ML Solostar Pen Inject 5 Units into the skin daily. Patient taking differently: Inject 10 Units into the skin daily. 01/26/22   [name], MD  Insulin Pen Needle 31G X 5 MM MISC Use as directed 12/31/21   [name], MD  lactulose (CHRONULAC) 10 GM/15ML solution Take 15 mLs (10 g total) by mouth 2 (two) times daily. Patient taking differently: Take 10 g by mouth 2 (two) times daily as needed for moderate constipation. 12/31/21   [name], MD  oxyCODONE-acetaminophen (PERCOCET) 5-325 MG tablet Take 1 tablet  by mouth every 4 (four) hours as needed for severe pain. 02/08/22 02/08/23  [name], MD  OXYGEN Inhale 2.5 L into the lungs continuous.    [provider]  sitaGLIPtin (JANUVIA) 25 MG tablet TAKE 1 TABLET(25 MG) BY MOUTH DAILY Patient taking differently: Take 25 mg by mouth daily. 08/31/21   [name], MD  torsemide (DEMADEX) 20 MG tablet Take 2 tablets (40 mg total) by mouth 2 (two) times daily. Patient taking differently: Take 60 mg by mouth 2 (two) times daily. 10/05/21   [place], Maricela Bo, FNP  vitamin B-12 (CYANOCOBALAMIN) 500 MCG tablet Take 500 mcg by mouth daily.    [provider]    Current Facility-Administered Medications  Medication Dose Route Frequency Provider Last Rate Last Admin   0.9 %  sodium chloride infusion (Manually program via Guardrails IV Fluids)   Intravenous Once Elmarie Shiley, MD       0.9 %  sodium chloride infusion (Manually program via Guardrails IV Fluids)   Intravenous Once Heath Lark D, DO   Held at 02/12/22 0859   0.9 %  sodium chloride infusion  250 mL Intravenous Continuous Frederik Pear, MD   Held at 02/11/22 [phone]   alteplase (CATHFLO ACTIVASE) injection 2 mg  2 mg Intracatheter Once PRN [name], MD       anticoagulant sodium citrate solution 5 mL  5 mL Intracatheter PRN [name], MD   Stopped at 02/11/22 0700   bisacodyl (DULCOLAX) suppository 10 mg  10 mg Rectal Daily PRN Noemi Chapel P, DO   10 mg at 02/12/22 0033   bisacodyl (DULCOLAX) suppository 10 mg  10 mg Rectal Once Manuella Ghazi, Pratik D, DO       [START ON 02/15/2022] ceFAZolin (ANCEF) IVPB 2g/100 mL premix  2 g Intravenous to XRAY Han, Aimee H, PA-C       Chlorhexidine Gluconate Cloth 2 % PADS 6 each  6 each Topical Q0600 [name], MD   6 each at 02/12/22 0200   Chlorhexidine Gluconate Cloth 2 % PADS 6 each  6 each Topical Q0600 Audria Nine, DO   6 each at 02/12/22 0200   [START ON 02/26/2022] Darbepoetin Alfa (ARANESP) injection 200 mcg   200 mcg Intravenous Q Sat-HD Dwana Melena, MD       docusate sodium (COLACE) capsule 100 mg  100 mg Oral BID PRN Gleason, Otilio Carpen, PA-C       insulin aspart (novoLOG) injection 0-6 Units  0-6 Units Subcutaneous Q4H Gleason, Otilio Carpen, PA-C  lidocaine-prilocaine (EMLA) cream 1 Application  1 Application Topical PRN [name], MD       multivitamin (RENA-VIT) tablet 1 tablet  1 tablet Oral QHS Collene Gobble, MD   1 tablet at 02/11/22 2017   norepinephrine (LEVOPHED) 68m in 2540m(0.016 mg/mL) premix infusion  2-10 mcg/min Intravenous Titrated OgFrederik PearMD   Held at 02/11/22 [phone] Oral care mouth rinse  15 mL Mouth Rinse PRN ByCollene GobbleMD       oxyCODONE (Oxy IR/ROXICODONE) immediate release tablet 5 mg  5 mg Oral Q6H PRN SoAnders SimmondsMD       pantoprazole (PROTONIX) injection 40 mg  40 mg Intravenous Q12H Shah, Pratik D, DO       polyethylene glycol (MIRALAX / GLYCOLAX) packet 17 g  17 g Oral Daily PRN Gleason, LaOtilio CarpenPA-C   17 g at 02/11/22 [id number] senna (SENOKOT) tablet 8.6 mg  1 tablet Oral Daily ClJulian HyDO   8.6 mg at 02/11/22 2017   sodium chloride flush (NS) 0.9 % injection 10-40 mL  10-40 mL Intracatheter Q12H MaAudria NineDO   10 mL at 02/11/22 2000   sodium chloride flush (NS) 0.9 % injection 10-40 mL  10-40 mL Intracatheter PRN MaAudria NineDO        Allergies as of 02/11/2022 - Review Complete 03/10/2022  Allergen Reaction Noted   Pork-derived products Other (See Comments) 11/13/2021    Family History  Problem Relation Age of Onset   Hypertension Mother    Hyperlipidemia Mother    Hypertension Sister    Kidney disease Father    Heart disease Brother 6518     open heart surgery   Colon cancer Neg Hx     Social History   Socioeconomic History   Marital status: Married    Spouse name: [name]   Number of children: 1   Years of education: 12th grade   Highest education level: Not on file  Occupational History    Occupation: Retired-accountant  Tobacco Use   Smoking status: Some Days    Packs/day: 0.10    Years: 48.00    Total pack years: 4.80    Types: Cigarettes    Last attempt to quit: 07/16/2019    Years since quitting: 2.5   Smokeless tobacco: Never   Tobacco comments:    09/03/21 Wife states, "he hides from me, so I am not sure how much he smokes. " Wife says he smokes occassionally 4 cigs a day."  Vaping Use   Vaping Use: Never used  Substance and Sexual Activity   Alcohol use: No    Alcohol/week: 0.0 standard drinks of alcohol   Drug use: No   Sexual activity: Never    Partners: Female  Other Topics Concern   Not on file  Social History Narrative   [place] following their [place] who came here for school.   Married.   Lives with his wife.   [place]   Education: [employer] Exercise: No   Social Determinants of [employer] broadcast assistanttrain: Not on file  Food Insecurity: Not on file  Transportation Needs: Not on file  Physical Activity: Not on file  Stress: Not on file  Social Connections: Not on file  Intimate Partner Violence: Not on file  Review of Systems: Gen: + Chronic weight loss. CV: Denies chest pain, palpitations or edema. Resp: Denies cough, shortness of breath of hemoptysis.  GI: See HPI GU : Burning with urination. MS: + Generalized weakness. Derm: Denies rash, itchiness, skin lesions or unhealing ulcers. Psych: Denies anxiety, depression or memory loss Heme: Denies easy bruising, bleeding. Neuro:  Denies headaches, dizziness or paresthesias. Endo:  + DM II.  Physical Exam: Vital signs in last 24 hours: Temp:  [97.5 F (36.4 C)-99.2 F (37.3 C)] 98.5 F (36.9 C) (08/04 0905) Pulse Rate:  [75-96] 96 (08/04 0905) Resp:  [12-31] 23 (08/04 0905) BP: (123-155)/(43-100) 147/57 (08/04 0905) SpO2:  [71 %-100 %] 96 % (08/04 0905) Last BM Date :   (PTA)  General:  Alert cachectic appearing 76 year old male in no acute distress Head:  Normocephalic and atraumatic. Eyes:  No scleral icterus. Conjunctiva pink. Ears:  Normal auditory acuity. Nose:  No deformity, discharge or lesions. Mouth: Poor dentition no ulcers or lesions.  Neck:  Supple. No lymphadenopathy or thyromegaly.  Lungs: Breath sounds clear, on 4 L nasal cannula. Heart: Regular rate and rhythm, systolic murmur. Abdomen: Soft, mildly distended.  Nontender.  Hypoactive bowel sounds to all 4 quadrants. Rectal: Deferred. Musculoskeletal:  Symmetrical without gross deformities.  Pulses:  Normal pulses noted. Extremities: Lateral lower extremities with trace edema, sequential compression stockings in use. Neurologic:  Alert and  oriented x 4. No focal deficits.  Skin:  Intact without significant lesions or rashes. Psych:  Alert and cooperative. Normal mood and affect.  Intake/Output from previous day: 08/03 0701 - 08/04 0700 In: 320 [P.O.:320] Out: 1115 [Urine:115] Intake/Output this shift: No intake/output data recorded.  Lab Results: Recent Labs    02/11/22 0000 02/11/22 0845 02/12/22 0607  WBC 7.5 7.4 9.8  HGB 6.1* 7.1* 6.4*  HCT 20.5* 21.5* 18.6*  PLT 132* 83* 39*   BMET Recent Labs    02/11/22 0000 02/11/22 0250 02/11/22 0845 02/11/22 1351 02/11/22 1836 02/12/22 0054 02/12/22 0607  NA 127*  --  137  --   --   --  133*  K 5.8* 6.7* 5.3*  5.3*   < > 5.7* 3.4* 4.1  CL 91*  --  98  --   --   --  93*  CO2 14*  --  20*  --   --   --  26  GLUCOSE 792* 97 76  --   --   --  121*  BUN 118*  --  85*  --   --   --  55*  CREATININE 5.32*  --  4.02*  --   --   --  2.62*  CALCIUM 8.5*  --  8.3*  --   --   --  8.1*   < > = values in this interval not displayed.   LFT Recent Labs    02/12/22 0607  PROT 5.5*  ALBUMIN 2.5*  2.5*  AST 3,070*  ALT 2,150*  ALKPHOS 1,318*  BILITOT 3.2*  BILIDIR 2.0*  IBILI 1.2*   PT/INR Recent Labs    02/11/22 0921  02/12/22 0607  LABPROT 24.7* 31.8*  INR 2.3* 3.1*   Hepatitis Panel Recent Labs    02/16/2022 2130 02/20/2022 2305  HEPBSAG  --  NON REACTIVE  HCVAB NON REACTIVE  --       Studies/Results: ECHOCARDIOGRAM LIMITED  Result Date: 02/11/2022    ECHOCARDIOGRAM LIMITED REPORT   Patient Name:   DALTIN CRIST Date of Exam: 02/11/2022 Medical  Rec #:  [id number]        Height:       66.0 in Accession #:    [id number]       Weight:       120.4 lb Date of Birth:  [birth year]        BSA:          1.612 m Patient Age:    58 years         BP:           136/56 mmHg Patient Gender: M                HR:           92 bpm. Exam Location:  Inpatient Procedure: Limited Echo, Color Doppler and Cardiac Doppler Indications:    R94.31 Abnormal EKG  History:        Patient has prior history of Echocardiogram examinations, most                 recent 11/14/2021. CHF, Arrythmias:Atrial Fibrillation; Risk                 Factors:Hypertension, Diabetes, Dyslipidemia and CKD.  Sonographer:    Raquel Sarna Senior RDCS Referring Phys: [id number] Francesca Jewett  Sonographer Comments: Exam performed sitting mostly upright due to dypsnea. IMPRESSIONS  1. Left ventricular ejection fraction, by estimation, is 55 to 60%. The left ventricle has normal function.  2. Right ventricular systolic function is normal. The right ventricular size is moderately enlarged. There is moderately elevated pulmonary artery systolic pressure.  3. Left atrial size was mildly dilated.  4. The mitral valve is normal in structure. Mild mitral valve regurgitation. No evidence of mitral stenosis.  5. Tricuspid valve regurgitation is moderate.  6. The aortic valve is tricuspid.  7. The inferior vena cava is dilated in size with <50% respiratory variability, suggesting right atrial pressure of 15 mmHg. FINDINGS  Left Ventricle: Left ventricular ejection fraction, by estimation, is 55 to 60%. The left ventricle has normal function. Right Ventricle: The right ventricular size is  moderately enlarged. Right ventricular systolic function is normal. There is moderately elevated pulmonary artery systolic pressure. The tricuspid regurgitant velocity is 3.18 m/s, and with an assumed right atrial pressure of 15 mmHg, the estimated right ventricular systolic pressure is 81.1 mmHg. Left Atrium: Left atrial size was mildly dilated. Mitral Valve: The mitral valve is normal in structure. Mild mitral valve regurgitation. No evidence of mitral valve stenosis. Tricuspid Valve: The tricuspid valve is normal in structure. Tricuspid valve regurgitation is moderate . No evidence of tricuspid stenosis. Aortic Valve: The aortic valve is tricuspid. Pulmonic Valve: The pulmonic valve was normal in structure. Pulmonic valve regurgitation is not visualized. No evidence of pulmonic stenosis. Aorta: Aortic root could not be assessed. Venous: The inferior vena cava is dilated in size with less than 50% respiratory variability, suggesting right atrial pressure of 15 mmHg. LEFT VENTRICLE PLAX 2D LVOT diam:     2.00 cm LV SV:         61 LV SV Index:   38 LVOT Area:     3.14 cm  RIGHT VENTRICLE RV S prime:     17.40 cm/s TAPSE (M-mode): 2.2 cm AORTIC VALVE LVOT Vmax:   119.00 cm/s LVOT Vmean:  71.200 cm/s LVOT VTI:    0.195 m TRICUSPID VALVE TR Peak grad:   40.4 mmHg TR Vmax:        318.00 cm/s  SHUNTS Systemic  VTI:  0.20 m Systemic Diam: 2.00 cm Godfrey Pick Tobb DO Electronically signed by Berniece Salines DO Signature Date/Time: 02/11/2022/12:34:17 PM    Final    CT ABDOMEN PELVIS WO CONTRAST  Result Date: 02/11/2022 CLINICAL DATA:  Abdominal pain, acute, nonlocalized. Left retroperitoneal paraganglioma, chronic kidney disease. EXAM: CT ABDOMEN AND PELVIS WITHOUT CONTRAST TECHNIQUE: Multidetector CT imaging of the abdomen and pelvis was performed following the standard protocol without IV contrast. RADIATION DOSE REDUCTION: This exam was performed according to the departmental dose-optimization program which includes automated  exposure control, adjustment of the mA and/or kV according to patient size and/or use of iterative reconstruction technique. COMPARISON:  01/28/2022. FINDINGS: Lower chest: The heart is enlarged and there is no pericardial effusion. Scattered coronary artery calcifications are noted. There are loculated pleural effusions bilaterally with patchy infiltrates. Hepatobiliary: No focal liver abnormality. No biliary ductal dilatation. The gallbladder is not visualized on exam. Pancreas: Unremarkable. No pancreatic ductal dilatation or surrounding inflammatory changes. Spleen: Normal in size without focal abnormality. Adrenals/Urinary Tract: Mild thickening of the adrenal glands with no evidence of discrete nodule. Cysts are noted in the kidneys bilaterally. There are tiny subcentimeter hyperdense structures in the kidneys bilaterally, possible hemorrhagic or proteinaceous cysts. No renal calculus or hydronephrosis. There is a slightly hyperdense lesion in the upper pole the right kidney measuring 2.4 cm. A Foley catheter is noted in the urinary bladder and the bladder is nondistended. Stomach/Bowel: Stomach is within normal limits. Appendix is not seen. No evidence of bowel wall thickening, distention, or inflammatory changes. No free air or pneumatosis. A few scattered diverticular present along the colon without evidence of diverticulitis. A moderate amount of retained stool is present in the colon. Vascular/Lymphatic: Aortic atherosclerosis. A stable mass is noted in the retroperitoneum on the left measuring 7.1 x 5.5 cm, compatible with known paraganglioma. No abdominal or pelvic lymphadenopathy. Reproductive: The prostate gland is enlarged. Other: There is a small left inguinal hernia containing nonobstructed bowel. Mesenteric edema is noted. No abdominopelvic ascites. Musculoskeletal: Degenerative changes are present in the thoracolumbar spine. No acute osseous abnormality. IMPRESSION: 1. No acute intra-abdominal  process. 2. Moderate amount of retained stool in the colon suggesting constipation. 3. Loculated pleural effusions bilaterally with strandy atelectasis or infiltrate, not significantly changed from the prior exam. 4. Stable retroperitoneal mass on the left, previously characterized as paraganglioma by history. 5. Bilateral renal cysts. There is a complex lesion in the upper pole of the right kidney measuring 2.4 cm. Ultrasound is recommended for further characterization on follow-up. 6. Aortic atherosclerosis. 7. Remaining incidental findings as described above. Electronically Signed   By: Brett Fairy M.D.   On: 02/11/2022 02:00   DG Chest Portable 1 View  Result Date: 02/11/2022 CLINICAL DATA:  Status post central line placement EXAM: PORTABLE CHEST 1 VIEW COMPARISON:  Chest x-ray from the previous day. FINDINGS: Cardiac shadow is enlarged but stable. New right jugular central line is noted at the cavoatrial junction. Stable effusions are seen right greater than left as well as basilar airspace disease right greater than left. No pneumothorax is seen. IMPRESSION: No pneumothorax following right jugular central line placement Remainder of the exam is stable. Electronically Signed   By: Inez Catalina M.D.   On: 02/11/2022 00:28   DG Chest Port 1 View  Result Date: 03/03/2022 CLINICAL DATA:  Weakness. EXAM: PORTABLE CHEST 1 VIEW COMPARISON:  December 13, 2021 FINDINGS: The heart size and mediastinal contours are within normal limits. There is calcification of the  aortic arch. Persistent marked severity multifocal airspace disease is seen within the mid and lower lungs. Stable bilateral pleural effusions are also noted, right greater than left. No pneumothorax is identified. The visualized skeletal structures are unremarkable. IMPRESSION: 1. Persistent marked severity multifocal airspace disease with stable bilateral pleural effusions, right greater than left. Electronically Signed   By: Virgina Norfolk M.D.   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  RN reported he had mild epistaxis. -NPO -Hold Eliquis -EGD versus enteroscopy benefits and risks discussed including risk with sedation, risk of bleeding, perforation and infection.  Timing to be determined.  Await further recommendations per Dr. Lyndel Safe. -Continue PPI IV twice daily -Transfuse for hemoglobin less than 8 -Check H&H posttransfusion  Epistaxis. Evaluated by ENT, right sided epistaxis secondary to superficial ulceration, packing placed.   Acute Transaminitis. T. Bili 2.6 -> 3.2.  Direct bili 2.0.  Indirect bili 1.2.  Alk phos  1,645 -> 1,318.  AST 9,636 -> 3,070. ALT 3,686 ->2,150.  No documented episodes of significant hypotension. Afebrile. CTAP w/o contrast 8/3 showed a normal liver without evidence of biliary ductal dilatation. No splenomegaly. -Check liver Doppler, await further recommendations per Dr. Lyndel Safe -CMV IgM, EBV IgM, hepatitis A IgM, hepatitis A total antibody, Hep B IgM, ANA, SMA, IgG  Hyperkalemia resolved. K+ 3.4.  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  Received hemodialysis 8/3 and 8/4.  Tunneled dialysis catheter placement deferred until GI evaluation completed.  Coagulopathy secondary to questionable liver disease + Eliquis  Atrial fibrillation.  Last dose of Eliquis was 8/3 at 9:30 AM.  Chronic constipation -MiraLAX nightly -Dulcolax suppository as needed  History of tubular adenomatous colon polyps per colonoscopy 11/2019 -Next colon polyp surveillance colonoscopy due 11/2022  Left paraganglioma with associated weight loss followed by oncology at [facility]  02/12/2022, 10:44AM    Attending physician's note   I have taken history, reviewed the chart and examined the patient. I performed a substantive portion of this encounter, including complete  performance of at least one of the key components, in conjunction with the APP. I agree with the Advanced Practitioner's note, impression and recommendations.   CGE Acute on chronic anemia. Multiple SB AVMs on VCE 12/2020 Epistaxis s/p nasal packing today. ? CGE d/t epistaxis Abn LFTs likely d/t shock liver. R/O other causes. Neg acute hep panel.Nl Liver on CT. Neg tylenol. Acute thrombocytopenia with worsening coagulopathy- ?etiology. ?DIC Significant constipation on CT. 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 Will assess in AM. May need plts if <50K. Check PT INR in AM. -Trend LFTs. Liver WU to r/o other etiologies. May have to stop Amiodarone if LFTs continue to be elevated. Stop lipitor. -Avoid hepatotoxic meds -Miralax for now.  -Trend CBC, LFTs -D/W son who is a physician. He understands overall guarded prognosis.   Carmell Austria, MD [facility] [phone]

## 2022-02-12 NOTE — Progress Notes (Signed)
PROGRESS NOTE    Adam Reid  VZD:[id number] DOB: 1946-05-23 DOA: 02/14/2022 PCP: [name], MD   Brief Narrative:  This is a 76 year old male with history of progressive stage V CKD with recent right upper arm AV graft placement 7/31, recently diagnosed left retroperitoneal paraganglioma, type 2 diabetes, hypertension, anemia of CKD, and HFpEF who presented to the ED 8/2 due to progressive fatigue, weakness, dyspnea.  He was noted to have junctional bradycardia with heart rate in the 30s and potassium 6.6.  Due to his life-threatening cardiac instability he was emergently started on hemodialysis with placement of temporary hemodialysis catheter and has started on hemodialysis treatments.  He has had severe anemia on 8/3 and required 1 unit PRBC transfusion.  On 8/4, he has developed coffee-ground emesis with worsening anemia and is requiring another unit of transfusion.  GI consulted with plans for enteroscopy on 8/5.  He is also noted to have a UTI for which she has been started on Rocephin.  Finally, he is struggling with epistaxis and is noted to have thrombocytopenia which will require 1 unit of platelet transfusion on 8/4.  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, IR to place tunneled catheter once more stable, plan for Monday -Next hemodialysis 8/5, CLIP process started  Junctional bradycardia secondary to hyperkalemia-resolved  Acute on chronic blood loss anemia with coffee-ground emesis -Prior history of small bowel AVMs -Appreciate GI evaluation with plans for enteroscopy 8/5 -Clear liquid diet and n.p.o. after midnight -PPI IV twice daily -Eliquis held  Ongoing epistaxis with thrombocytopenia -Plan to transfuse 1 unit platelets today to keep above 50,000  due to ongoing bleed -Avoid heparin and antiplatelet agents -Monitor repeat CBC -ENT Dr. Constance Holster has packed nose on 8/4  HFpEF -2D echocardiogram 8/3 with LVEF 55-60% -No significant volume overload noted -Holding home torsemide  DM2 -SSI  Hypertension -Resume home diltiazem and amiodarone and monitor  History of atrial fibrillation on Eliquis  -Resume home amiodarone and diltiazem and monitor on telemetry -Hold Eliquis due to acute bleed as noted above  Left paraganglioma with associated weight loss -Followed by oncology at [facility] to monitor -Hold home statin -CT abdomen without any significant findings -Acute hepatitis panel negative  Pseudomonas UTI -Complains of dysuria -Started on Rocephin with sensitivities pending  TSH elevation -Check free T4  DVT prophylaxis: SCDs Code Status: Full Family Communication: Discussed with son at bedside 8/4 Disposition Plan:  Status is: Inpatient Remains inpatient appropriate because: Need for IV medications and transfusions   Consultants:  Nephrology GI ENT  Procedures:  Temporary dialysis catheter placement 8/3  Antimicrobials:  Anti-infectives (From admission, onward)    Start     Dose/Rate Route Frequency Ordered Stop   02/15/22 0600  ceFAZolin (ANCEF) IVPB 2g/100 mL premix        2 g 200 mL/hr over 30 Minutes Intravenous To Radiology 02/11/22 1423 02/16/22 0600   02/12/22 1100  cefTRIAXone (ROCEPHIN) 1 g in sodium chloride 0.9 % 100 mL IVPB        1 g 200 mL/hr over 30 Minutes Intravenous Every 24 hours 02/12/22 1019     03/02/2022 2330  piperacillin-tazobactam (ZOSYN) IVPB 3.375 g        3.375 g 100 mL/hr over 30 Minutes Intravenous  Once 02/27/2022 2319 02/11/22 0015       Subjective: Patient seen and evaluated today with noted epigastric abdominal pain and 2 episodes of coffee-ground emesis this morning.  He is also having ongoing epistaxis.  Objective: Vitals:   02/12/22  1142 02/12/22 1200 02/12/22 1230 02/12/22 1300  BP:  (!) 142/59 (!) 140/57 (!) 141/55  Pulse:  92 90 89  Resp:  '12 12 12  '$ Temp: 98.9 F (37.2 C)  98.9 F (37.2 C)   TempSrc: Oral  Oral   SpO2:  94% 95% 95%  Weight:      Height:        Intake/Output Summary (Last 24 hours) at 02/12/2022 1516 Last data filed at 02/12/2022 1300 Gross per 24 hour  Intake 540.07 ml  Output 1115 ml  Net -574.93 ml   Filed Weights   02/11/22 0002 02/11/22 0151  Weight: 52.2 kg 54.6 kg    Examination:  General exam: Appears calm and comfortable  Respiratory system: Clear to auscultation. Respiratory effort normal.  4 L nasal cannula Cardiovascular system: S1 & S2 heard, RRR.  Gastrointestinal system: Abdomen is soft Central nervous system: Alert and awake Extremities: No edema Skin: No significant lesions noted Psychiatry: Flat affect.    Data Reviewed: I have personally reviewed following labs and imaging studies  CBC: Recent Labs  Lab 02/15/2022 2130 03/01/2022 2140 02/11/22 0000 02/11/22 0845 02/12/22 0607  WBC 5.1  --  7.5 7.4 9.8  NEUTROABS 4.4  --   --   --   --   HGB 6.8* 6.5*  6.8* 6.1* 7.1* 6.4*  HCT 21.5* 19.0*  20.0* 20.5* 21.5* 18.6*  MCV 92.3  --  98.1 88.8 87.3  PLT 170  --  132* 83* 39*   Basic Metabolic Panel: Recent Labs  Lab 02/19/2022 2130 02/11/2022 2140 02/11/22 0000 02/11/22 0250 02/11/22 0845 02/11/22 1351 02/11/22 1836 02/12/22 0054 02/12/22 0607  NA 140 135  135 127*  --  137  --   --   --  133*  K 6.9* 6.6*  6.6* 5.8* 6.7* 5.3*  5.3* 5.7* 5.7* 3.4* 4.1  CL 100 104 91*  --  98  --   --   --  93*  CO2 18*  --  14*  --  20*  --   --   --  26  GLUCOSE 63* 59* 792* 97 76  --   --   --  121*  BUN 122* >130* 118*  --  85*  --   --   --  55*  CREATININE 5.57* 6.00* 5.32*  --  4.02*  --   --   --  2.62*  CALCIUM 8.8*  --  8.5*  --  8.3*  --   --   --  8.1*  MG 2.9*  --   --   --   --   --   --   --   --   PHOS  --   --   --   --   --   --   --   --  6.1*    GFR: Estimated Creatinine Clearance: 19.1 mL/min (A) (by C-G formula based on SCr of 2.62 mg/dL (H)). Liver Function Tests: Recent Labs  Lab 02/15/2022 2130 02/11/22 0845 02/12/22 0607  AST 1,537* 9,636* 3,070*  ALT 743* 3,686* 2,150*  ALKPHOS 1,966* 1,645* 1,318*  BILITOT 1.2 2.6* 3.2*  PROT 6.6 5.9* 5.5*  ALBUMIN 2.9* 2.5* 2.5*  2.5*  No results for input(s): "LIPASE", "AMYLASE" in the last 168 hours. No results for input(s): "AMMONIA" in the last 168 hours. Coagulation Profile: Recent Labs  Lab 02/14/2022 2130 02/11/22 0921 02/12/22 0607  INR 1.3* 2.3* 3.1*   Cardiac Enzymes: No results for input(s): "CKTOTAL", "CKMB", "CKMBINDEX", "TROPONINI" in the last 168 hours. BNP (last 3 results) Recent Labs    12/10/21 1339  PROBNP 12,645*   HbA1C: No results for input(s): "HGBA1C" in the last 72 hours. CBG: Recent Labs  Lab 02/11/22 1919 02/11/22 2324 02/12/22 0549 02/12/22 0801 02/12/22 1139  GLUCAP 135* 121* 120* 107* 116*   Lipid Profile: No results for input(s): "CHOL", "HDL", "LDLCALC", "TRIG", "CHOLHDL", "LDLDIRECT" in the last 72 hours. Thyroid Function Tests: Recent Labs    03/10/2022 2130  TSH 5.515*   Anemia Panel: Recent Labs    02/17/2022 1254  FERRITIN 2,207*  TIBC 237*  IRON 48   Sepsis Labs: No results for input(s): "PROCALCITON", "LATICACIDVEN" in the last 168 hours.  Recent Results (from the past 240 hour(s))  MRSA Next Gen by PCR, Nasal     Status: None   Collection Time: 02/11/22  1:50 AM   Specimen: Nasal Mucosa; Nasal Swab  Result Value Ref Range Status   MRSA by PCR Next Gen NOT DETECTED NOT DETECTED Final    Comment: (NOTE) The GeneXpert MRSA Assay (FDA approved for NASAL specimens only), is one component of a comprehensive MRSA colonization surveillance program. It is not intended to diagnose MRSA infection nor to guide Adam monitor treatment for MRSA infections. Test performance is not FDA approved in patients less than 6  years old. Performed at [facility], [place] [address] [place], [place] [address]   Culture, Urine (Do not remove urinary catheter, catheter placed by urology Adam difficult to place)     Status: Abnormal (Preliminary result)   Collection Time: 02/11/22  1:51 PM   Specimen: Urine, Catheterized  Result Value Ref Range Status   Specimen Description URINE, CATHETERIZED  Final   Special Requests NONE  Final   Culture (A)  Final    30,000 COLONIES/mL PSEUDOMONAS PUTIDA SUSCEPTIBILITIES TO FOLLOW Performed at [facility], [address], [place], [place] [address]    Report Status PENDING  Incomplete         Radiology Studies: ECHOCARDIOGRAM LIMITED  Result Date: 02/11/2022    ECHOCARDIOGRAM LIMITED REPORT   Patient Name:   PEARLY APACHITO Date of Exam: 02/11/2022 Medical Rec #:  [id number]        Height:       66.0 in Accession #:    [id number]       Weight:       120.4 lb Date of Birth:  10-11-1945        BSA:          1.612 m Patient Age:    54 years         BP:           136/56 mmHg Patient Gender: M                HR:           92 bpm. Exam Location:  Inpatient Procedure: Limited Echo, Color Doppler and Cardiac Doppler Indications:    R94.31 Abnormal EKG  History:        Patient has prior history of Echocardiogram examinations, most  recent 11/14/2021. CHF, Arrythmias:Atrial Fibrillation; Risk                 Factors:Hypertension, Diabetes, Dyslipidemia and CKD.  Sonographer:    Raquel Sarna Senior RDCS Referring Phys: [id number] Francesca Jewett  Sonographer Comments: Exam performed sitting mostly upright due to dypsnea. IMPRESSIONS  1. Left ventricular ejection fraction, by estimation, is 55 to 60%. The left ventricle has normal function.  2. Right ventricular systolic function is normal. The right ventricular size is moderately enlarged. There is moderately elevated pulmonary artery systolic pressure.  3. Left atrial size was mildly dilated.  4. The mitral valve is normal in  structure. Mild mitral valve regurgitation. No evidence of mitral stenosis.  5. Tricuspid valve regurgitation is moderate.  6. The aortic valve is tricuspid.  7. The inferior vena cava is dilated in size with <50% respiratory variability, suggesting right atrial pressure of 15 mmHg. FINDINGS  Left Ventricle: Left ventricular ejection fraction, by estimation, is 55 to 60%. The left ventricle has normal function. Right Ventricle: The right ventricular size is moderately enlarged. Right ventricular systolic function is normal. There is moderately elevated pulmonary artery systolic pressure. The tricuspid regurgitant velocity is 3.18 m/s, and with an assumed right atrial pressure of 15 mmHg, the estimated right ventricular systolic pressure is 17.6 mmHg. Left Atrium: Left atrial size was mildly dilated. Mitral Valve: The mitral valve is normal in structure. Mild mitral valve regurgitation. No evidence of mitral valve stenosis. Tricuspid Valve: The tricuspid valve is normal in structure. Tricuspid valve regurgitation is moderate . No evidence of tricuspid stenosis. Aortic Valve: The aortic valve is tricuspid. Pulmonic Valve: The pulmonic valve was normal in structure. Pulmonic valve regurgitation is not visualized. No evidence of pulmonic stenosis. Aorta: Aortic root could not be assessed. Venous: The inferior vena cava is dilated in size with less than 50% respiratory variability, suggesting right atrial pressure of 15 mmHg. LEFT VENTRICLE PLAX 2D LVOT diam:     2.00 cm LV SV:         61 LV SV Index:   38 LVOT Area:     3.14 cm  RIGHT VENTRICLE RV S prime:     17.40 cm/s TAPSE (M-mode): 2.2 cm AORTIC VALVE LVOT Vmax:   119.00 cm/s LVOT Vmean:  71.200 cm/s LVOT VTI:    0.195 m TRICUSPID VALVE TR Peak grad:   40.4 mmHg TR Vmax:        318.00 cm/s  SHUNTS Systemic VTI:  0.20 m Systemic Diam: 2.00 cm Kardie Tobb DO Electronically signed by Berniece Salines DO Signature Date/Time: 02/11/2022/12:34:17 PM    Final    CT ABDOMEN  PELVIS WO CONTRAST  Result Date: 02/11/2022 CLINICAL DATA:  Abdominal pain, acute, nonlocalized. Left retroperitoneal paraganglioma, chronic kidney disease. EXAM: CT ABDOMEN AND PELVIS WITHOUT CONTRAST TECHNIQUE: Multidetector CT imaging of the abdomen and pelvis was performed following the standard protocol without IV contrast. RADIATION DOSE REDUCTION: This exam was performed according to the departmental dose-optimization program which includes automated exposure control, adjustment of the mA and/Adam kV according to patient size and/Adam use of iterative reconstruction technique. COMPARISON:  01/28/2022. FINDINGS: Lower chest: The heart is enlarged and there is no pericardial effusion. Scattered coronary artery calcifications are noted. There are loculated pleural effusions bilaterally with patchy infiltrates. Hepatobiliary: No focal liver abnormality. No biliary ductal dilatation. The gallbladder is not visualized on exam. Pancreas: Unremarkable. No pancreatic ductal dilatation Adam surrounding inflammatory changes. Spleen: Normal in size without focal abnormality. Adrenals/Urinary Tract:  Mild thickening of the adrenal glands with no evidence of discrete nodule. Cysts are noted in the kidneys bilaterally. There are tiny subcentimeter hyperdense structures in the kidneys bilaterally, possible hemorrhagic Adam proteinaceous cysts. No renal calculus Adam hydronephrosis. There is a slightly hyperdense lesion in the upper pole the right kidney measuring 2.4 cm. A Foley catheter is noted in the urinary bladder and the bladder is nondistended. Stomach/Bowel: Stomach is within normal limits. Appendix is not seen. No evidence of bowel wall thickening, distention, Adam inflammatory changes. No free air Adam pneumatosis. A few scattered diverticular present along the colon without evidence of diverticulitis. A moderate amount of retained stool is present in the colon. Vascular/Lymphatic: Aortic atherosclerosis. A stable mass is noted  in the retroperitoneum on the left measuring 7.1 x 5.5 cm, compatible with known paraganglioma. No abdominal Adam pelvic lymphadenopathy. Reproductive: The prostate gland is enlarged. Other: There is a small left inguinal hernia containing nonobstructed bowel. Mesenteric edema is noted. No abdominopelvic ascites. Musculoskeletal: Degenerative changes are present in the thoracolumbar spine. No acute osseous abnormality. IMPRESSION: 1. No acute intra-abdominal process. 2. Moderate amount of retained stool in the colon suggesting constipation. 3. Loculated pleural effusions bilaterally with strandy atelectasis Adam infiltrate, not significantly changed from the prior exam. 4. Stable retroperitoneal mass on the left, previously characterized as paraganglioma by history. 5. Bilateral renal cysts. There is a complex lesion in the upper pole of the right kidney measuring 2.4 cm. Ultrasound is recommended for further characterization on follow-up. 6. Aortic atherosclerosis. 7. Remaining incidental findings as described above. Electronically Signed   By: Brett Fairy M.D.   On: 02/11/2022 02:00   DG Chest Portable 1 View  Result Date: 02/11/2022 CLINICAL DATA:  Status post central line placement EXAM: PORTABLE CHEST 1 VIEW COMPARISON:  Chest x-ray from the previous day. FINDINGS: Cardiac shadow is enlarged but stable. New right jugular central line is noted at the cavoatrial junction. Stable effusions are seen right greater than left as well as basilar airspace disease right greater than left. No pneumothorax is seen. IMPRESSION: No pneumothorax following right jugular central line placement Remainder of the exam is stable. Electronically Signed   By: Inez Catalina M.D.   On: 02/11/2022 00:28   DG Chest Port 1 View  Result Date: 02/09/2022 CLINICAL DATA:  Weakness. EXAM: PORTABLE CHEST 1 VIEW COMPARISON:  December 13, 2021 FINDINGS: The heart size and mediastinal contours are within normal limits. There is calcification of the  aortic arch. Persistent marked severity multifocal airspace disease is seen within the mid and lower lungs. Stable bilateral pleural effusions are also noted, right greater than left. No pneumothorax is identified. The visualized skeletal structures are unremarkable. IMPRESSION: 1. Persistent marked severity multifocal airspace disease with stable bilateral pleural effusions, right greater than left. Electronically Signed   By: Virgina Norfolk M.D.   On: 03/06/2022 21:45        Scheduled Meds:  sodium chloride   Intravenous Once   sodium chloride   Intravenous Once   Chlorhexidine Gluconate Cloth  6 each Topical Q0600   Chlorhexidine Gluconate Cloth  6 each Topical Q0600   [START ON 03/05/2022] darbepoetin (ARANESP) injection - DIALYSIS  200 mcg Intravenous Q Sat-HD   insulin aspart  0-6 Units Subcutaneous Q4H   multivitamin  1 tablet Oral QHS   pantoprazole (PROTONIX) IV  40 mg Intravenous Q12H   senna  1 tablet Oral Daily   sodium chloride flush  10-40 mL Intracatheter Q12H  Continuous Infusions:  sodium chloride Stopped (02/12/22 1243)   anticoagulant sodium citrate Stopped (02/11/22 0700)   [START ON 02/15/2022]  ceFAZolin (ANCEF) IV     cefTRIAXone (ROCEPHIN)  IV Stopped (02/12/22 1213)   norepinephrine (LEVOPHED) Adult infusion Stopped (02/11/22 0449)     LOS: 1 day    Time spent: 35 minutes    Kshawn Canal Darleen Crocker, DO [facility]  If 7PM-7AM, please contact night-coverage [email or web address] 02/12/2022, 3:16 PM

## 2022-02-12 NOTE — Progress Notes (Signed)
   This pt is active with [facility] a home based Palliative care program provided by [facility].  He has been receiving nursing visits routinely and SW visit to assist with his chronic illnesses and sx management needs.   Pt has equipment in his home of oxygen, walker, wheelchair, BSC and shower chair currently.   We will follow while in hospital and assist with coordination of care at d/c.   Webb Silversmith RN [phone]

## 2022-02-12 NOTE — Progress Notes (Signed)
Pt placed on 35% venturi mask due to nose bleeds.

## 2022-02-12 NOTE — Consult Note (Signed)
Reason for Consult: Epistaxis Referring Physician: Rodena Goldmann, DO  Adam Reid is an 76 y.o. male.  HPI: Patient admitted for hemodialysis access, having nosebleeds since early this morning.  On supplemental nasal oxygen.  On anticoagulation therapy.  Pancytopenic including low platelets.  Bleeding has been persistent all day on and off.  Past Medical History:  Diagnosis Date   Anemia    Anxiety    BPH (benign prostatic hyperplasia)    Cancer (HCC)    CHF (congestive heart failure) (HCC)    Chronic kidney disease    Depression    Diabetes mellitus without complication (HCC)    Type II   Dyspnea    ESRD (end stage renal disease) (HCC)    GERD (gastroesophageal reflux disease)    Heart murmur    History of blood transfusion    Hyperlipidemia    Hypertension    PAF (paroxysmal atrial fibrillation) ([place])    Thyroid disease    was on supplement, taken off by [name]    Past Surgical History:  Procedure Laterality Date   A/V FISTULAGRAM Right 01/29/2022   Procedure: A/V Fistulagram;  Surgeon: [name], MD;  Location: [facility];  Service: Cardiovascular;  Laterality: Right;   APPENDECTOMY     AV FISTULA PLACEMENT Right 09/04/2021   Procedure: RIGHT BRACHIOCEPHALIC ARTERIOVENOUS (AV) FISTULA CREATION;  Surgeon: [name], MD;  Location: [facility];  Service: Vascular;  Laterality: Right;   [place] Right 02/08/2022   Procedure: RIGHT UPPER ARM GORE-TEX GRAFT INSERTION USING 4-14m STRETCH GORETEX GRAFT;  Surgeon: DAngelia Mould MD;  [facility]  Service: Vascular;  Laterality: Right;   BIOPSY  12/25/2020   Procedure: BIOPSY;  Surgeon: MIrving Copas, MD;  Location: [facility]  Service: Gastroenterology;;   CARDIAC CATHETERIZATION N/A 01/30/2016   Procedure: Left Heart Cath and Coronary Angiography;  Surgeon: JJettie Booze MD;  [facility];  Service: Cardiovascular;  Laterality: N/A;    ENTEROSCOPY N/A 12/25/2020   Procedure: ENTEROSCOPY;  Surgeon: MRush LandmarkGTelford Nab, MD;  [facility]  Service: Gastroenterology;  Laterality: N/A;   GIVENS CAPSULE STUDY N/A 12/25/2020   Procedure: GIVENS CAPSULE STUDY;  Surgeon: MIrving Copas, MD;  [facility]  Service: Gastroenterology;  Laterality: N/A;   SUBMUCOSAL TATTOO INJECTION  12/25/2020   Procedure: SUBMUCOSAL TATTOO INJECTION;  Surgeon: MRush LandmarkGTelford Nab, MD;  [facility]  Service: Gastroenterology;;    Family History  Problem Relation Age of Onset   Hypertension Mother    Hyperlipidemia Mother    Hypertension Sister    Kidney disease Father    Heart disease Brother 643      open heart surgery   Colon cancer Neg Hx     Social History:  reports that he has been smoking cigarettes. He has a 4.80 pack-year smoking history. He has never used smokeless tobacco. He reports that he does not drink alcohol and does not use drugs.  Allergies:  Allergies  Allergen Reactions   Pork-Derived Products     Medications: Reviewed  Results for orders placed or performed during the hospital encounter of 02/11/2022 (from the past 48 hour(s))  Brain natriuretic peptide     Status: Abnormal   Collection Time: 02/28/2022  9:22 PM  Result Value Ref Range   B Natriuretic Peptide 1,477.8 (H) 0.0 - 100.0 pg/mL    Comment: [facility]formed [facility] [address]  [place], [place] [address]  Comprehensive metabolic panel     Status: Abnormal   Collection Time: 02/23/2022  9:30 PM  Result Value Ref Range   Sodium 140 135 - 145 mmol/L   Potassium 6.9 (HH) 3.5 - 5.1 mmol/L    Comment: DELTA CHECK NOTED CRITICAL RESULT CALLED TO, READ BACK BY AND VERIFIED WITH L TOLER,RN 2255 02/20/2022 WBOND    Chloride 100 98 - 111 mmol/L   CO2 18 (L) 22 - 32 mmol/L   Glucose, Bld 63 (L) 70 - 99 mg/dL    Comment: Glucose reference range applies only to samples taken after fasting for at least 8  hours.   BUN 122 (H) 8 - 23 mg/dL   Creatinine, Ser 5.57 (H) 0.61 - 1.24 mg/dL   Calcium 8.8 (L) 8.9 - 10.3 mg/dL   Total Protein 6.6 6.5 - 8.1 g/dL   Albumin 2.9 (L) 3.5 - 5.0 g/dL   AST 1,537 (H) 15 - 41 U/L   ALT 743 (H) 0 - 44 U/L   Alkaline Phosphatase 1,966 (H) 38 - 126 U/L    Comment: RESULT CONFIRMED BY MANUAL DILUTION   Total Bilirubin 1.2 0.3 - 1.2 mg/dL   GFR, Estimated 10 (L) >60 mL/min    Comment: (NOTE) Calculated using the CKD-EPI Creatinine Equation (2021)    Anion gap 22 (H) 5 - 15    Comment: Electrolytes repeated to confirm. Performed at [facility], [place] [address] [place], [place] [address]   Troponin I (High Sensitivity)     Status: Abnormal   Collection Time: 02/16/2022  9:30 PM  Result Value Ref Range   Troponin I (High Sensitivity) 81 (H) <18 ng/L    Comment: (NOTE) Elevated high sensitivity troponin I (hsTnI) values and significant  changes across serial measurements may suggest ACS but many other  chronic and acute conditions are known to elevate hsTnI results.  Refer to the "Links" section for chest pain algorithms and additional  guidance. Performed at [facility], [place] [address] [place], [place] [address]   CBC with Differential     Status: Abnormal   Collection Time: 02/19/2022  9:30 PM  Result Value Ref Range   WBC 5.1 4.0 - 10.5 K/uL   RBC 2.33 (L) 4.22 - 5.81 MIL/uL   Hemoglobin 6.8 (LL) 13.0 - 17.0 g/dL    Comment: REPEATED TO VERIFY THIS CRITICAL RESULT HAS VERIFIED AND BEEN CALLED TO LINDA T RN BY BERTRAM TAYLOR ON 08 02 2023 AT 2225, AND HAS BEEN READ BACK.     HCT 21.5 (L) 39.0 - 52.0 %   MCV 92.3 80.0 - 100.0 fL   MCH 29.2 26.0 - 34.0 pg   MCHC 31.6 30.0 - 36.0 g/dL   RDW 16.1 (H) 11.5 - 15.5 %   Platelets 170 150 - 400 K/uL   nRBC 0.0 0.0 - 0.2 %   Neutrophils Relative % 85 %   Neutro Abs 4.4 1.7 - 7.7 K/uL   Lymphocytes Relative 5 %   Lymphs Abs 0.3 (L) 0.7 - 4.0 K/uL   Monocytes Relative 9 %   Monocytes  Absolute 0.4 0.1 - 1.0 K/uL   Eosinophils Relative 0 %   Eosinophils Absolute 0.0 0.0 - 0.5 K/uL   Basophils Relative 0 %   Basophils Absolute 0.0 0.0 - 0.1 K/uL   Immature Granulocytes 1 %   Abs Immature Granulocytes 0.05 0.00 - 0.07 K/uL    Comment: Performed at [facility], [address] [place], [place] [address]  Protime-INR     Status: Abnormal   Collection Time: 03/08/2022  9:30 PM  Result Value Ref Range   Prothrombin Time 16.1 (H) 11.4 - 15.2 seconds   INR 1.3 (H) 0.8 - 1.2    Comment: (NOTE) INR goal varies based on device and disease states. Performed at [facility], [place] [address], [place], [place] [address]   Magnesium     Status: Abnormal   Collection Time: 02/16/2022  9:30 PM  Result Value Ref Range   Magnesium 2.9 (H) 1.7 - 2.4 mg/dL    Comment: Performed at [facility] [address], [place], [place] [address]  TSH     Status: Abnormal   Collection Time: 03/02/2022  9:30 PM  Result Value Ref Range   TSH 5.515 (H) 0.350 - 4.500 uIU/mL    Comment: Performed by a 3rd Generation assay with a functional sensitivity of <=0.01 uIU/mL. Performed at [facility], [place] [address] [place], [place] [address]   Hepatitis B surface antibody     Status: None   Collection Time: 03/07/2022  9:30 PM  Result Value Ref Range   Hep B S Ab NON REACTIVE NON REACTIVE    Comment: (NOTE) Inconsistent with immunity, less than 10 mIU/mL.  Performed at [facility], [place] [address] [place], [place] [address]   Hepatitis B surface antibody,quantitative     Status: Abnormal   Collection Time: 02/11/2022  9:30 PM  Result Value Ref Range   Hep B S AB Quant (Post) <3.1 (L) Immunity>9.9 mIU/mL    Comment: (NOTE)  Status of Immunity                     Anti-HBs Level  ------------------                     -------------- Inconsistent with Immunity                   0.0 - 9.9 Consistent with Immunity                          >9.9 Performed At: [facility] [place], [place] [id number] Rush Farmer MD [id number]   Hepatitis B core antibody, total     Status: None   Collection Time: 02/18/2022  9:30 PM  Result Value Ref Range   Hep B Core Total Ab NON REACTIVE NON REACTIVE    Comment: Performed at [place] [address] [place], [place] [address]  Hepatitis C antibody     Status: None   Collection Time: 03/07/2022  9:30 PM  Result Value Ref Range   HCV Ab NON REACTIVE NON REACTIVE    Comment: (NOTE) Nonreactive HCV antibody screen is consistent with no HCV infections,  unless recent infection is suspected or other evidence exists to indicate HCV infection.  Performed at [facility], [place] [address], [place],  [address]   I-stat chem 8, ED     Status: Abnormal   Collection Time: 02/15/2022  9:40 PM  Result Value Ref Range   Sodium 135 135 - 145 mmol/L   Potassium 6.6 (HH) 3.5 - 5.1 mmol/L   Chloride 104 98 - 111 mmol/L   BUN >130 (H) 8 - 23 mg/dL   Creatinine, Ser 6.00 (H) 0.61 - 1.24 mg/dL  Glucose, Bld 59 (L) 70 - 99 mg/dL    Comment: Glucose reference range applies only to samples taken after fasting for at least 8 hours.   Calcium, Ion 1.01 (L) 1.15 - 1.40 mmol/L   TCO2 20 (L) 22 - 32 mmol/L   Hemoglobin 6.8 (LL) 13.0 - 17.0 g/dL   HCT 20.0 (L) 39.0 - 52.0 %   Comment NOTIFIED PHYSICIAN   I-Stat venous blood gas, ED     Status: Abnormal   Collection Time: 02/21/2022  9:40 PM  Result Value Ref Range   pH, Ven 7.251 7.25 - 7.43   pCO2, Ven 42.2 (L) 44 - 60 mmHg   pO2, Ven 65 (H) 32 - 45 mmHg   Bicarbonate 18.6 (L) 20.0 - 28.0 mmol/L   TCO2 20 (L) 22 - 32 mmol/L   O2 Saturation 89 %   Acid-base deficit 8.0 (H) 0.0 - 2.0 mmol/L   Sodium 135 135 - 145 mmol/L   Potassium 6.6 (HH) 3.5 - 5.1 mmol/L   Calcium, Ion 1.02 (L) 1.15 - 1.40 mmol/L   HCT 19.0 (L) 39.0 - 52.0 %   Hemoglobin 6.5 (LL) 13.0 - 17.0 g/dL   Sample type VENOUS    Comment NOTIFIED PHYSICIAN   CBG monitoring,  ED     Status: Abnormal   Collection Time: 02/28/2022  9:59 PM  Result Value Ref Range   Glucose-Capillary 34 (LL) 70 - 99 mg/dL    Comment: Glucose reference range applies only to samples taken after fasting for at least 8 hours.   Comment 1 Notify RN    Comment 2 Repeat Test    Comment 3 Document in Chart   CBG monitoring, ED     Status: Abnormal   Collection Time: 02/12/2022 10:58 PM  Result Value Ref Range   Glucose-Capillary 135 (H) 70 - 99 mg/dL    Comment: Glucose reference range applies only to samples taken after fasting for at least 8 hours.  Hepatitis B surface antigen     Status: None   Collection Time: 02/19/2022 11:05 PM  Result Value Ref Range   Hepatitis B Surface Ag NON REACTIVE NON REACTIVE    Comment: Performed at [facility] [address] [place], [place] [address]  Troponin I (High Sensitivity)     Status: Abnormal   Collection Time: 02/11/22 12:00 AM  Result Value Ref Range   Troponin I (High Sensitivity) 82 (H) <18 ng/L    Comment: (NOTE) Elevated high sensitivity troponin I (hsTnI) values and significant  changes across serial measurements may suggest ACS but many other  chronic and acute conditions are known to elevate hsTnI results.  Refer to the "Links" section for chest pain algorithms and additional  guidance. Performed at [facility], [place] [address], [place] [address]   CBC     Status: Abnormal   Collection Time: 02/11/22 12:00 AM  Result Value Ref Range   WBC 7.5 4.0 - 10.5 K/uL   RBC 2.09 (L) 4.22 - 5.81 MIL/uL   Hemoglobin 6.1 (LL) 13.0 - 17.0 g/dL    Comment: CRITICAL VALUE NOTED.  VALUE IS CONSISTENT WITH PREVIOUSLY REPORTED AND CALLED VALUE. REPEATED TO VERIFY    HCT 20.5 (L) 39.0 - 52.0 %   MCV 98.1 80.0 - 100.0 fL   MCH 29.2 26.0 - 34.0 pg   MCHC 29.8 (L) 30.0 - 36.0 g/dL   RDW 16.9 (H) 11.5 - 15.5 %   Platelets 132 (L)  150 - 400 K/uL    Comment: REPEATED TO VERIFY   nRBC 0.0 0.0 - 0.2 %    Comment: Performed at  [facility], [place] [address] [place], [place] [address]  Basic metabolic panel     Status: Abnormal   Collection Time: 02/11/22 12:00 AM  Result Value Ref Range   Sodium 127 (L) 135 - 145 mmol/L    Comment: DELTA CHECK NOTED   Potassium 5.8 (H) 3.5 - 5.1 mmol/L   Chloride 91 (L) 98 - 111 mmol/L   CO2 14 (L) 22 - 32 mmol/L   Glucose, Bld 792 (HH) 70 - 99 mg/dL    Comment: CRITICAL RESULT CALLED TO, READ BACK BY AND VERIFIED WITH L. TOLER RN, 0136, 02/11/22, EADEDOKUN Glucose reference range applies only to samples taken after fasting for at least 8 hours.    BUN 118 (H) 8 - 23 mg/dL   Creatinine, Ser 5.32 (H) 0.61 - 1.24 mg/dL   Calcium 8.5 (L) 8.9 - 10.3 mg/dL   GFR, Estimated 11 (L) >60 mL/min    Comment: (NOTE) Calculated using the CKD-EPI Creatinine Equation (2021)    Anion gap 22 (H) 5 - 15    Comment: Electrolytes repeated to confirm. Performed at [facility], [place] [address] [place], [place] [address]   Glucose, capillary     Status: Abnormal   Collection Time: 02/11/22  1:48 AM  Result Value Ref Range   Glucose-Capillary 135 (H) 70 - 99 mg/dL    Comment: Glucose reference range applies only to samples taken after fasting for at least 8 hours.  MRSA Next Gen by PCR, Nasal     Status: None   Collection Time: 02/11/22  1:50 AM   Specimen: Nasal Mucosa; Nasal Swab  Result Value Ref Range   MRSA by PCR Next Gen NOT DETECTED NOT DETECTED    Comment: (NOTE) The GeneXpert MRSA Assay (FDA approved for NASAL specimens only), is one component of a comprehensive MRSA colonization surveillance program. It is not intended to diagnose MRSA infection nor to guide or monitor treatment for MRSA infections. Test performance is not FDA approved in patients less than 74 years old. Performed at [facility], [place] [address] [place], [place] [address]   Potassium     Status: Abnormal   Collection Time: 02/11/22  2:50 AM  Result Value Ref Range   Potassium 6.7 (HH) 3.5  - 5.1 mmol/L    Comment: CRITICAL RESULT CALLED TO, READ BACK BY AND VERIFIED WITH B. SCOTT RN, 0335, 02/11/22, EADEDOKUN Performed at [facility], [place] [address], [place], [place] [address]   Glucose, random     Status: None   Collection Time: 02/11/22  2:50 AM  Result Value Ref Range   Glucose, Bld 97 70 - 99 mg/dL    Comment: Glucose reference range applies only to samples taken after fasting for at least 8 hours. Performed at [facility], [place] [address] [place],  [address]   Glucose, capillary     Status: None   Collection Time: 02/11/22  3:31 AM  Result Value Ref Range   Glucose-Capillary 84 70 - 99 mg/dL    Comment: Glucose reference range applies only to samples taken after fasting for at least 8 hours.  Glucose, capillary     Status: Abnormal   Collection Time: 02/11/22  7:51 AM  Result Value Ref Range   Glucose-Capillary 54 (L) 70 - 99 mg/dL  Comment: Glucose reference range applies only to samples taken after fasting for at least 8 hours.   Comment 1 Notify RN    Comment 2 Document in Chart   Glucose, capillary     Status: Abnormal   Collection Time: 02/11/22  8:30 AM  Result Value Ref Range   Glucose-Capillary 69 (L) 70 - 99 mg/dL    Comment: Glucose reference range applies only to samples taken after fasting for at least 8 hours.  Urinalysis, Routine w reflex microscopic     Status: Abnormal   Collection Time: 02/11/22  8:45 AM  Result Value Ref Range   Color, Urine AMBER (A) YELLOW    Comment: BIOCHEMICALS MAY BE AFFECTED BY COLOR   APPearance CLOUDY (A) CLEAR   Specific Gravity, Urine 1.010 1.005 - 1.030   pH 5.0 5.0 - 8.0   Glucose, UA NEGATIVE NEGATIVE mg/dL   Hgb urine dipstick LARGE (A) NEGATIVE   Bilirubin Urine NEGATIVE NEGATIVE   Ketones, ur NEGATIVE NEGATIVE mg/dL   Protein, ur 100 (A) NEGATIVE mg/dL   Nitrite NEGATIVE NEGATIVE   Leukocytes,Ua SMALL (A) NEGATIVE   RBC / HPF >50 (H) 0 - 5 RBC/hpf   WBC, UA 21-50 0 - 5 WBC/hpf    Bacteria, UA FEW (A) NONE SEEN   Squamous Epithelial / LPF 0-5 0 - 5   Amorphous [place] PRESENT     Comment: Performed at [facility], [address] [place], [place] [address]  Potassium     Status: Abnormal   Collection Time: 02/11/22  8:45 AM  Result Value Ref Range   Potassium 5.3 (H) 3.5 - 5.1 mmol/L    Comment: NO VISIBLE HEMOLYSIS Performed at [facility] [address] [place], [place] [address]   Basic metabolic panel     Status: Abnormal   Collection Time: 02/11/22  8:45 AM  Result Value Ref Range   Sodium 137 135 - 145 mmol/L    Comment: DELTA CHECK NOTED   Potassium 5.3 (H) 3.5 - 5.1 mmol/L    Comment: DELTA CHECK NOTED   Chloride 98 98 - 111 mmol/L   CO2 20 (L) 22 - 32 mmol/L   Glucose, Bld 76 70 - 99 mg/dL    Comment: Glucose reference range applies only to samples taken after fasting for at least 8 hours.   BUN 85 (H) 8 - 23 mg/dL   Creatinine, Ser 4.02 (H) 0.61 - 1.24 mg/dL   Calcium 8.3 (L) 8.9 - 10.3 mg/dL   GFR, Estimated 15 (L) >60 mL/min    Comment: (NOTE) Calculated using the CKD-EPI Creatinine Equation (2021)    Anion gap 19 (H) 5 - 15    Comment: Performed at [facility] [address] [place], [place] [address]  Parathyroid hormone, intact (no Ca)     Status: Abnormal   Collection Time: 02/11/22  8:45 AM  Result Value Ref Range   PTH 725 (H) 15 - 65 pg/mL    Comment: (NOTE) Performed At: [facility] [place], [place] [id number] Rush Farmer MD [id number]   VITAMIN D 25 Hydroxy (Vit-D Deficiency, Fractures)     Status: None   Collection Time: 02/11/22  8:45 AM  Result Value Ref Range   Vit D, 25-Hydroxy 38.19 30 - 100 ng/mL    Comment: (NOTE) Vitamin D deficiency has been defined by the Institute of Medicine  and an Endocrine Society practice guideline as a level of serum 25-OH  vitamin D less than 20 ng/mL (1,2). The Endocrine Society went on to  further define vitamin D insufficiency as a  level between 21 and 29  ng/mL (2).  1. IOM (Institute of Medicine). 2010. Dietary reference intakes for  calcium and D. [place]: The [employer]. 2. Holick MF, Binkley Bayard, Bischoff-Ferrari HA, et al. Evaluation,  treatment, and prevention of vitamin D deficiency: an Endocrine  Society clinical practice guideline, JCEM. 2011 Jul; 96(7): 1911-30.  Performed at [facility], [place] [address], [place], [place] [address]   CBC     Status: Abnormal   Collection Time: 02/11/22  8:45 AM  Result Value Ref Range   WBC 7.4 4.0 - 10.5 K/uL   RBC 2.42 (L) 4.22 - 5.81 MIL/uL   Hemoglobin 7.1 (L) 13.0 - 17.0 g/dL   HCT 21.5 (L) 39.0 - 52.0 %   MCV 88.8 80.0 - 100.0 fL    Comment: POST TRANSFUSION SPECIMEN REPEATED TO VERIFY    MCH 29.3 26.0 - 34.0 pg   MCHC 33.0 30.0 - 36.0 g/dL   RDW 15.3 11.5 - 15.5 %   Platelets 83 (L) 150 - 400 K/uL    Comment: Immature Platelet Fraction may be clinically indicated, consider ordering this additional test [id number] REPEATED TO VERIFY    nRBC 0.0 0.0 - 0.2 %    Comment: Performed at [facility], [place] [address] [place], [place] [address]  Hepatic function panel     Status: Abnormal   Collection Time: 02/11/22  8:45 AM  Result Value Ref Range   Total Protein 5.9 (L) 6.5 - 8.1 g/dL   Albumin 2.5 (L) 3.5 - 5.0 g/dL   AST 9,636 (H) 15 - 41 U/L    Comment: RESULT CONFIRMED BY MANUAL DILUTION   ALT 3,686 (H) 0 - 44 U/L    Comment: RESULT CONFIRMED BY MANUAL DILUTION   Alkaline Phosphatase 1,645 (H) 38 - 126 U/L   Total Bilirubin 2.6 (H) 0.3 - 1.2 mg/dL   Bilirubin, Direct 1.4 (H) 0.0 - 0.2 mg/dL   Indirect Bilirubin 1.2 (H) 0.3 - 0.9 mg/dL    Comment: Performed at [facility] [address] [place], [place] [address]  Glucose, capillary     Status: Abnormal   Collection Time: 02/11/22  8:58 AM  Result Value Ref Range   Glucose-Capillary 68 (L) 70 - 99 mg/dL    Comment: Glucose reference range applies only to  samples taken after fasting for at least 8 hours.  Protime-INR     Status: Abnormal   Collection Time: 02/11/22  9:21 AM  Result Value Ref Range   Prothrombin Time 24.7 (H) 11.4 - 15.2 seconds   INR 2.3 (H) 0.8 - 1.2    Comment: (NOTE) INR goal varies based on device and disease states. Performed at [facility], [place] [address], [place], [place] [address]   Glucose, capillary     Status: None   Collection Time: 02/11/22 11:29 AM  Result Value Ref Range   Glucose-Capillary 80 70 - 99 mg/dL    Comment: Glucose reference range applies only to samples taken after fasting for at least 8 hours.   Comment 1 Notify RN    Comment 2 Document in Chart   Potassium     Status: Abnormal   Collection Time: 02/11/22  1:51 PM  Result Value Ref Range   Potassium 5.7 (H) 3.5 - 5.1 mmol/L    Comment:  Performed at [facility], [place] [address] [place], [place] [address]  Culture, Urine (Do not remove urinary catheter, catheter placed by urology or difficult to place)     Status: Abnormal (Preliminary result)   Collection Time: 02/11/22  1:51 PM   Specimen: Urine, Catheterized  Result Value Ref Range   Specimen Description URINE, CATHETERIZED    Special Requests NONE    Culture (A)     30,000 COLONIES/mL PSEUDOMONAS PUTIDA SUSCEPTIBILITIES TO FOLLOW Performed at [facility] [address] [place], [place] [address]    Report Status PENDING   Acetaminophen level     Status: Abnormal   Collection Time: 02/11/22  1:51 PM  Result Value Ref Range   Acetaminophen (Tylenol), Serum <10 (L) 10 - 30 ug/mL    Comment: (NOTE) Therapeutic concentrations vary significantly. A range of 10-30 ug/mL  may be an effective concentration for many patients. However, some  are best treated at concentrations outside of this range. Acetaminophen concentrations >150 ug/mL at 4 hours after ingestion  and >50 ug/mL at 12 hours after ingestion are often associated with  toxic reactions.  Performed  at [facility], [place] [address], [place], [place] [address]   Glucose, capillary     Status: None   Collection Time: 02/11/22  3:15 PM  Result Value Ref Range   Glucose-Capillary 99 70 - 99 mg/dL    Comment: Glucose reference range applies only to samples taken after fasting for at least 8 hours.   Comment 1 Notify RN    Comment 2 Document in Chart   Potassium     Status: Abnormal   Collection Time: 02/11/22  6:36 PM  Result Value Ref Range   Potassium 5.7 (H) 3.5 - 5.1 mmol/L    Comment: Performed at [facility], [place] [address], [place],  [address]  Glucose, capillary     Status: Abnormal   Collection Time: 02/11/22  7:19 PM  Result Value Ref Range   Glucose-Capillary 135 (H) 70 - 99 mg/dL    Comment: Glucose reference range applies only to samples taken after fasting for at least 8 hours.  Glucose, capillary     Status: Abnormal   Collection Time: 02/11/22 11:24 PM  Result Value Ref Range   Glucose-Capillary 121 (H) 70 - 99 mg/dL    Comment: Glucose reference range applies only to samples taken after fasting for at least 8 hours.  Potassium     Status: Abnormal   Collection Time: 02/12/22 12:54 AM  Result Value Ref Range   Potassium 3.4 (L) 3.5 - 5.1 mmol/L    Comment: DELTA CHECK NOTED Performed at [facility], [place] [address] [place], [place] [address]   Glucose, capillary     Status: Abnormal   Collection Time: 02/12/22  5:49 AM  Result Value Ref Range   Glucose-Capillary 120 (H) 70 - 99 mg/dL    Comment: Glucose reference range applies only to samples taken after fasting for at least 8 hours.  CBC     Status: Abnormal   Collection Time: 02/12/22  6:07 AM  Result Value Ref Range   WBC 9.8 4.0 - 10.5 K/uL   RBC 2.13 (L) 4.22 - 5.81 MIL/uL   Hemoglobin 6.4 (LL) 13.0 - 17.0 g/dL    Comment: REPEATED TO VERIFY THIS CRITICAL RESULT HAS VERIFIED AND BEEN CALLED TO ASHLEY CLARK,RN BY ZELDA BEECH ON 08 04 2023 AT 0728, AND HAS BEEN READ  BACK.      HCT 18.6 (L) 39.0 - 52.0 %   MCV 87.3 80.0 - 100.0 fL   MCH 30.0 26.0 - 34.0 pg   MCHC 34.4 30.0 - 36.0 g/dL   RDW 15.7 (H) 11.5 - 15.5 %   Platelets 39 (L) 150 - 400 K/uL    Comment: Immature Platelet Fraction may be clinically indicated, consider ordering this additional test [id number] REPEATED TO VERIFY PLATELET COUNT CONFIRMED BY SMEAR    nRBC 0.2 0.0 - 0.2 %    Comment: Performed at [facility], [place] [address], [place], [place] [address]  Renal function panel     Status: Abnormal   Collection Time: 02/12/22  6:07 AM  Result Value Ref Range   Sodium 133 (L) 135 - 145 mmol/L   Potassium 4.1 3.5 - 5.1 mmol/L   Chloride 93 (L) 98 - 111 mmol/L   CO2 26 22 - 32 mmol/L   Glucose, Bld 121 (H) 70 - 99 mg/dL    Comment: Glucose reference range applies only to samples taken after fasting for at least 8 hours.   BUN 55 (H) 8 - 23 mg/dL   Creatinine, Ser 2.62 (H) 0.61 - 1.24 mg/dL   Calcium 8.1 (L) 8.9 - 10.3 mg/dL   Phosphorus 6.1 (H) 2.5 - 4.6 mg/dL   Albumin 2.5 (L) 3.5 - 5.0 g/dL   GFR, Estimated 25 (L) >60 mL/min    Comment: (NOTE) Calculated using the CKD-EPI Creatinine Equation (2021)    Anion gap 14 5 - 15    Comment: Performed at [facility] [address], [place], [place] [address]  Hepatic function panel     Status: Abnormal   Collection Time: 02/12/22  6:07 AM  Result Value Ref Range   Total Protein 5.5 (L) 6.5 - 8.1 g/dL   Albumin 2.5 (L) 3.5 - 5.0 g/dL   AST 3,070 (H) 15 - 41 U/L    Comment: RESULT CONFIRMED BY MANUAL DILUTION   ALT 2,150 (H) 0 - 44 U/L   Alkaline Phosphatase 1,318 (H) 38 - 126 U/L   Total Bilirubin 3.2 (H) 0.3 - 1.2 mg/dL   Bilirubin, Direct 2.0 (H) 0.0 - 0.2 mg/dL   Indirect Bilirubin 1.2 (H) 0.3 - 0.9 mg/dL    Comment: Performed at [facility] [address] [place], [place] [address]  Protime-INR     Status: Abnormal   Collection Time: 02/12/22  6:07 AM  Result Value Ref Range   Prothrombin Time 31.8 (H) 11.4 - 15.2  seconds   INR 3.1 (H) 0.8 - 1.2    Comment: (NOTE) INR goal varies based on device and disease states. Performed at [facility], [place] [address] [place], [place] [address]   Glucose, capillary     Status: Abnormal   Collection Time: 02/12/22  8:01 AM  Result Value Ref Range   Glucose-Capillary 107 (H) 70 - 99 mg/dL    Comment: Glucose reference range applies only to samples taken after fasting for at least 8 hours.  Prepare RBC (crossmatch)     Status: None   Collection Time: 02/12/22  8:30 AM  Result Value Ref Range   Order Confirmation      ORDER PROCESSED BY BLOOD BANK Performed at [facility], [address] [place], [place] [address]   Occult bld gastric/duodenum (cup to lab)     Status: Abnormal   Collection Time: 02/12/22  8:46 AM  Result Value Ref Range   Occult Blood, Gastric POSITIVE (A) NEGATIVE    Comment: Performed at [facility], [place] [address], [place], [place] [address]  Glucose, capillary     Status: Abnormal   Collection Time: 02/12/22 11:39 AM  Result Value Ref Range   Glucose-Capillary 116 (H) 70 - 99 mg/dL    Comment: Glucose reference range applies only to samples taken after fasting for at least 8 hours.  Prepare platelet pheresis     Status: None (Preliminary result)   Collection Time: 02/12/22  4:00 PM  Result Value Ref Range   Unit Number [id number]    Blood Component Type PLTP2 PSORALEN TREATED    Unit division 00    Status of Unit ISSUED    Transfusion Status      OK TO TRANSFUSE Performed at [facility] [address], [place], [place] [address]   Glucose, capillary     Status: Abnormal   Collection Time: 02/12/22  4:02 PM  Result Value Ref Range   Glucose-Capillary 116 (H) 70 - 99 mg/dL    Comment: Glucose reference range applies only to samples taken after fasting for at least 8 hours.    ECHOCARDIOGRAM LIMITED  Result Date: 02/11/2022    ECHOCARDIOGRAM LIMITED REPORT   Patient Name:   Adam Reid  Date of Exam: 02/11/2022 Medical Rec #:  [id number]        Height:       66.0 in Accession #:    [id number]       Weight:       120.4 lb Date of Birth:  [birth year]        BSA:          1.612 m Patient Age:    9 years         BP:           136/56 mmHg Patient Gender: M                HR:           92 bpm. Exam Location:  Inpatient Procedure: Limited Echo, Color Doppler and Cardiac Doppler Indications:    R94.31 Abnormal EKG  History:        Patient has prior history of Echocardiogram examinations, most                 recent 11/14/2021. CHF, Arrythmias:Atrial Fibrillation; Risk                 Factors:Hypertension, Diabetes, Dyslipidemia and CKD.  Sonographer:    Raquel Sarna Senior RDCS Referring Phys: [id number] Francesca Jewett  Sonographer Comments: Exam performed sitting mostly upright due to dypsnea. IMPRESSIONS  1. Left ventricular ejection fraction, by estimation, is 55 to 60%. The left ventricle has normal function.  2. Right ventricular systolic function is normal. The right ventricular size is moderately enlarged. There is moderately elevated pulmonary artery systolic pressure.  3. Left atrial size was mildly dilated.  4. The mitral valve is normal in structure. Mild mitral valve regurgitation. No evidence of mitral stenosis.  5. Tricuspid valve regurgitation is moderate.  6. The aortic valve is tricuspid.  7. The inferior vena cava is dilated in size with <50% respiratory variability, suggesting right atrial pressure of 15 mmHg. FINDINGS  Left Ventricle: Left ventricular ejection fraction, by estimation, is 55 to 60%. The left ventricle has normal function. Right Ventricle: The right ventricular size is moderately enlarged. Right ventricular systolic function  is normal. There is moderately elevated pulmonary artery systolic pressure. The tricuspid regurgitant velocity is 3.18 m/s, and with an assumed right atrial pressure of 15 mmHg, the estimated right ventricular systolic pressure is 40.3 mmHg. Left Atrium: Left  atrial size was mildly dilated. Mitral Valve: The mitral valve is normal in structure. Mild mitral valve regurgitation. No evidence of mitral valve stenosis. Tricuspid Valve: The tricuspid valve is normal in structure. Tricuspid valve regurgitation is moderate . No evidence of tricuspid stenosis. Aortic Valve: The aortic valve is tricuspid. Pulmonic Valve: The pulmonic valve was normal in structure. Pulmonic valve regurgitation is not visualized. No evidence of pulmonic stenosis. Aorta: Aortic root could not be assessed. Venous: The inferior vena cava is dilated in size with less than 50% respiratory variability, suggesting right atrial pressure of 15 mmHg. LEFT VENTRICLE PLAX 2D LVOT diam:     2.00 cm LV SV:         61 LV SV Index:   38 LVOT Area:     3.14 cm  RIGHT VENTRICLE RV S prime:     17.40 cm/s TAPSE (M-mode): 2.2 cm AORTIC VALVE LVOT Vmax:   119.00 cm/s LVOT Vmean:  71.200 cm/s LVOT VTI:    0.195 m TRICUSPID VALVE TR Peak grad:   40.4 mmHg TR Vmax:        318.00 cm/s  SHUNTS Systemic VTI:  0.20 m Systemic Diam: 2.00 cm Kardie Tobb DO Electronically signed by Berniece Salines DO Signature Date/Time: 02/11/2022/12:34:17 PM    Final    CT ABDOMEN PELVIS WO CONTRAST  Result Date: 02/11/2022 CLINICAL DATA:  Abdominal pain, acute, nonlocalized. Left retroperitoneal paraganglioma, chronic kidney disease. EXAM: CT ABDOMEN AND PELVIS WITHOUT CONTRAST TECHNIQUE: Multidetector CT imaging of the abdomen and pelvis was performed following the standard protocol without IV contrast. RADIATION DOSE REDUCTION: This exam was performed according to the departmental dose-optimization program which includes automated exposure control, adjustment of the mA and/or kV according to patient size and/or use of iterative reconstruction technique. COMPARISON:  01/28/2022. FINDINGS: Lower chest: The heart is enlarged and there is no pericardial effusion. Scattered coronary artery calcifications are noted. There are loculated pleural  effusions bilaterally with patchy infiltrates. Hepatobiliary: No focal liver abnormality. No biliary ductal dilatation. The gallbladder is not visualized on exam. Pancreas: Unremarkable. No pancreatic ductal dilatation or surrounding inflammatory changes. Spleen: Normal in size without focal abnormality. Adrenals/Urinary Tract: Mild thickening of the adrenal glands with no evidence of discrete nodule. Cysts are noted in the kidneys bilaterally. There are tiny subcentimeter hyperdense structures in the kidneys bilaterally, possible hemorrhagic or proteinaceous cysts. No renal calculus or hydronephrosis. There is a slightly hyperdense lesion in the upper pole the right kidney measuring 2.4 cm. A Foley catheter is noted in the urinary bladder and the bladder is nondistended. Stomach/Bowel: Stomach is within normal limits. Appendix is not seen. No evidence of bowel wall thickening, distention, or inflammatory changes. No free air or pneumatosis. A few scattered diverticular present along the colon without evidence of diverticulitis. A moderate amount of retained stool is present in the colon. Vascular/Lymphatic: Aortic atherosclerosis. A stable mass is noted in the retroperitoneum on the left measuring 7.1 x 5.5 cm, compatible with known paraganglioma. No abdominal or pelvic lymphadenopathy. Reproductive: The prostate gland is enlarged. Other: There is a small left inguinal hernia containing nonobstructed bowel. Mesenteric edema is noted. No abdominopelvic ascites. Musculoskeletal: Degenerative changes are present in the thoracolumbar spine. No acute osseous abnormality. IMPRESSION: 1. No acute intra-abdominal process.  2. Moderate amount of retained stool in the colon suggesting constipation. 3. Loculated pleural effusions bilaterally with strandy atelectasis or infiltrate, not significantly changed from the prior exam. 4. Stable retroperitoneal mass on the left, previously characterized as paraganglioma by history. 5.  Bilateral renal cysts. There is a complex lesion in the upper pole of the right kidney measuring 2.4 cm. Ultrasound is recommended for further characterization on follow-up. 6. Aortic atherosclerosis. 7. Remaining incidental findings as described above. Electronically Signed   By: Brett Fairy M.D.   On: 02/11/2022 02:00   DG Chest Portable 1 View  Result Date: 02/11/2022 CLINICAL DATA:  Status post central line placement EXAM: PORTABLE CHEST 1 VIEW COMPARISON:  Chest x-ray from the previous day. FINDINGS: Cardiac shadow is enlarged but stable. New right jugular central line is noted at the cavoatrial junction. Stable effusions are seen right greater than left as well as basilar airspace disease right greater than left. No pneumothorax is seen. IMPRESSION: No pneumothorax following right jugular central line placement Remainder of the exam is stable. Electronically Signed   By: Inez Catalina M.D.   On: 02/11/2022 00:28   DG Chest Port 1 View  Result Date: 02/27/2022 CLINICAL DATA:  Weakness. EXAM: PORTABLE CHEST 1 VIEW COMPARISON:  December 13, 2021 FINDINGS: The heart size and mediastinal contours are within normal limits. There is calcification of the aortic arch. Persistent marked severity multifocal airspace disease is seen within the mid and lower lungs. Stable bilateral pleural effusions are also noted, right greater than left. No pneumothorax is identified. The visualized skeletal structures are unremarkable. IMPRESSION: 1. Persistent marked severity multifocal airspace disease with stable bilateral pleural effusions, right greater than left. Electronically Signed   By: Virgina Norfolk M.D.   On: 03/09/2022 21:45    LKG:[id number] except as listed in admit H&P  Blood pressure (!) 148/58, pulse 91, temperature 98.3 F (36.8 C), temperature source Oral, resp. rate (!) 23, height '5\' 6"'$  (1.676 m), weight 54.6 kg, SpO2 93 %.  PHYSICAL EXAM: Overall appearance: Elderly gentleman, with facemask  oxygen Head:  Normocephalic, atraumatic. Ears: External ears look healthy. Nose: External nose is healthy in appearance. Internal nasal exam suctioned of old blood on the right side.  Bilateral anterior septal ulcerations with scabs, bloody scab on the right.  No active bleeding. Oral Cavity/Pharynx:  There are no mucosal lesions or masses identified.  Diffuse dryness of the oral mucosa. Larynx/Hypopharynx: Deferred Neuro:  No identifiable neurologic deficits. Neck: No palpable neck masses.  Studies Reviewed: none  Procedures: Topical Xylocaine/Afrin was applied to the nasal cavities and spray form and then cotton pledgets.  All mucus and blood was suctioned out of the right nasal cavity.  No active bleeding was encountered.  The right side was packed with a long SlimLine Merocel that was cut down in length and with.  This was then inflated with the local anesthetic mix.   Assessment/Plan: Right-sided epistaxis secondary to superficial septal ulceration.  Packing placed.  Continue medical treatment of other conditions.  Plan to remove packing in 3 to 5 days.  Contact me sooner if there is any additional bleeding.   R04.0  Medical Decision Making: #/Complex Problems: 3  Data Reviewed:1  Management:3 (1-Straightforward, 2-Low, 3-Moderate, 4-High)   Izora Gala 02/12/2022, 4:24 PM

## 2022-02-12 NOTE — Progress Notes (Addendum)
Contacted [facility] admissions to request update on pt's referral. Awaiting response.  Melven Sartorius Renal Navigator [phone]  Addendum at 4:26 pm: Pt has been accepted at [facility] on MWF 12:15 chair time. Pt can start next Wednesday if stable. Pt will need to arrive at 11:30 to complete paperwork prior to treatment. Spoke to pt's son via phone to provide update. Will provide schedule letter on Monday. Update provided to nephrologist.

## 2022-02-12 NOTE — Progress Notes (Signed)
Nutrition Follow-up  DOCUMENTATION CODES:   Severe malnutrition in context of chronic illness  INTERVENTION:   D/C Nepro Shake while NPO.  D/C Renal MVI while NPO.  If unable to advance PO diet within the next 24-48 hours, consider TPN as patient is severely malnourished.   Defer education at this time, RD to follow for education needs.   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  Ongoing  GOAL:   Patient will meet greater than or equal to 90% of their needs  Unmet  MONITOR:   PO intake, Supplement acceptance, Labs  REASON FOR ASSESSMENT:   Consult Diet education  ASSESSMENT:   76 yo male admitted with symptomatic bradycardia, anemia, hyperkalemia. PMH includes CKD 5, HTN, DM-2, HLD, CHF, GERD, PAF.  Discussed patient in ICU rounds and with RN today. Patient is now NPO d/t GI bleeding. He had 2 episodes of coffee ground emesis and mild epistaxis this morning. Hgb trending down. S/P PRBC x 1 unit this morning. GI following. Plans for EGD vs enteroscopy.   Received consult for diet education regarding renal diet. Given severe malnutrition, dietary restrictions are not indicated at this time. Also, patient is now NPO for GI bleeding. RD to follow for education needs.   Labs reviewed. Na 133, phos 6.1 CBG: 120-107  Medications reviewed and include Aranesp, Novolog SSI q 4 hours, Rena-vit, Protonix.  Diet Order:   Diet Order             Diet NPO time specified  Diet effective midnight           Diet clear liquid Room service appropriate? Yes; Fluid consistency: Thin  Diet effective now                   EDUCATION NEEDS:   Not appropriate for education at this time  Skin:  Skin Assessment: Reviewed RN Assessment  Last BM:  no BM documented  Height:   Ht Readings from Last 1 Encounters:  02/11/22 '5\' 6"'$  (1.676 m)    Weight:   Wt Readings  from Last 1 Encounters:  02/11/22 54.6 kg    Ideal Body Weight:  64.5 kg  BMI:  Body mass index is 19.43 kg/m.  Estimated Nutritional Needs:   Kcal:  1900-2200  Protein:  85-100 gm  Fluid:  1 L + UOP   Lucas Mallow RD, LDN, CNSC Please refer to Amion for contact information.

## 2022-02-12 NOTE — Progress Notes (Addendum)
[facility] Progress Note   5M progressive stage V CKD followed by Dr. 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 Found to be in junctional bradycardia with a heart rate in the 30s, potassium 6.6, QRS prolonged at 150. Tx medically.  Had been hesitant to initiate dialysis but has been willing to receive it when it came to a life or death situation.   Assessment/ Plan:   5M with advanced CKD 5 progressed senting with junctional bradycardia, progressive dyspnea, worsened renal failure, severe anemia. 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 Junctional bradycardia secondary to hyperkalemia while on amiodarone and diltiazem, as above Hyperkalemia with EKG changes upon presentation. Anemia, severe, on ESA therapy as outpatient HFpEF DM2 Hypertension, blood pressure stable S/p RUE AVG placement 02/08/22 [facility]   Plan S/P HD #1 very early 8/3, 2nd treatment very early 8/4; appreciate VIR seeing him for TC conversion. K now 4.1 which should make the conversion safer -> conversion for Monday once he's more stable. Next HD on Sat. . Started the CLIP process He is now ESRD Transfuse as needed; elevated Ferritin and will not be able to give further iron + ESA to be added. Could there be bleeding? Hb not rising appropriate post transfusion. Phos 6.1; start binder in the next 48hrs once he's more stable and out of the ICU.  Looks like a very high stick with the RIJ temp; should get a lower stick for new TC. Renal diet + dietician to educate; I removed a banana from the tray. Will follow closely with you.  Subjective:   Denies  f/c/n/v/dyspnea/ CP. But still poor appetite. Epistaxis.   Objective:   BP (!) 148/56   Pulse 90   Temp 97.8 F (36.6 C) (Oral)   Resp 15   Ht '5\' 6"'$  (1.676 m)   Wt 54.6 kg   SpO2 94%   BMI 19.43 kg/m   Intake/Output Summary (Last 24 hours) at 02/12/2022 0759 Last data filed at 02/12/2022 0115 Gross per 24 hour  Intake 320 ml  Output 1115 ml  Net -795 ml   Weight change:   Physical Exam: GEN: Unwell appearing,  thin/cachectic ENT: NCAT EYES: EOMI CV: regular rhythm PULM: Appears to be comfortable ABD: Soft, nontender SKIN: No rashes or lesions EXT: no lower extremity edema ACCESS: RUA AVG + thrill    Imaging: ECHOCARDIOGRAM LIMITED  Result Date: 02/11/2022    ECHOCARDIOGRAM LIMITED REPORT   Patient Name:   Adam Reid Date of Exam: 02/11/2022 Medical Rec #:  [id number]        Height:       66.0 in Accession #:    [id number]       Weight:       120.4 lb Date of Birth:  May 22, 1946        BSA:          1.612 m Patient Age:    76 years         BP:           136/56 mmHg Patient Gender: M                HR:  92 bpm. Exam Location:  Inpatient Procedure: Limited Echo, Color Doppler and Cardiac Doppler Indications:    R94.31 Abnormal EKG  History:        Patient has prior history of Echocardiogram examinations, most                 recent 11/14/2021. CHF, Arrythmias:Atrial Fibrillation; Risk                 Factors:Hypertension, Diabetes, Dyslipidemia and CKD.  Sonographer:    Raquel Sarna Senior RDCS Referring Phys: [id number] Francesca Jewett  Sonographer Comments: Exam performed sitting mostly upright due to dypsnea. IMPRESSIONS  1. Left ventricular ejection fraction, by estimation, is 55 to 60%. The left ventricle has normal function.  2. Right ventricular systolic function is normal. The right ventricular size is moderately enlarged. There is moderately elevated pulmonary artery systolic pressure.  3. Left atrial size was mildly dilated.  4. The mitral valve is normal in structure. Mild  mitral valve regurgitation. No evidence of mitral stenosis.  5. Tricuspid valve regurgitation is moderate.  6. The aortic valve is tricuspid.  7. The inferior vena cava is dilated in size with <50% respiratory variability, suggesting right atrial pressure of 15 mmHg. FINDINGS  Left Ventricle: Left ventricular ejection fraction, by estimation, is 55 to 60%. The left ventricle has normal function. Right Ventricle: The right ventricular size is moderately enlarged. Right ventricular systolic function is normal. There is moderately elevated pulmonary artery systolic pressure. The tricuspid regurgitant velocity is 3.18 m/s, and with an assumed right atrial pressure of 15 mmHg, the estimated right ventricular systolic pressure is 14.7 mmHg. Left Atrium: Left atrial size was mildly dilated. Mitral Valve: The mitral valve is normal in structure. Mild mitral valve regurgitation. No evidence of mitral valve stenosis. Tricuspid Valve: The tricuspid valve is normal in structure. Tricuspid valve regurgitation is moderate . No evidence of tricuspid stenosis. Aortic Valve: The aortic valve is tricuspid. Pulmonic Valve: The pulmonic valve was normal in structure. Pulmonic valve regurgitation is not visualized. No evidence of pulmonic stenosis. Aorta: Aortic root could not be assessed. Venous: The inferior vena cava is dilated in size with less than 50% respiratory variability, suggesting right atrial pressure of 15 mmHg. LEFT VENTRICLE PLAX 2D LVOT diam:     2.00 cm LV SV:         61 LV SV Index:   38 LVOT Area:     3.14 cm  RIGHT VENTRICLE RV S prime:     17.40 cm/s TAPSE (M-mode): 2.2 cm AORTIC VALVE LVOT Vmax:   119.00 cm/s LVOT Vmean:  71.200 cm/s LVOT VTI:    0.195 m TRICUSPID VALVE TR Peak grad:   40.4 mmHg TR Vmax:        318.00 cm/s  SHUNTS Systemic VTI:  0.20 m Systemic Diam: 2.00 cm Kardie Tobb DO Electronically signed by Berniece Salines DO Signature Date/Time: 02/11/2022/12:34:17 PM    Final    CT ABDOMEN PELVIS WO  CONTRAST  Result Date: 02/11/2022 CLINICAL DATA:  Abdominal pain, acute, nonlocalized. Left retroperitoneal paraganglioma, chronic kidney disease. EXAM: CT ABDOMEN AND PELVIS WITHOUT CONTRAST TECHNIQUE: Multidetector CT imaging of the abdomen and pelvis was performed following the standard protocol without IV contrast. RADIATION DOSE REDUCTION: This exam was performed according to the departmental dose-optimization program which includes automated exposure control, adjustment of the mA and/or kV according to patient size and/or use of iterative reconstruction technique. COMPARISON:  01/28/2022. FINDINGS: Lower chest: The heart is enlarged and there  is no pericardial effusion. Scattered coronary artery calcifications are noted. There are loculated pleural effusions bilaterally with patchy infiltrates. Hepatobiliary: No focal liver abnormality. No biliary ductal dilatation. The gallbladder is not visualized on exam. Pancreas: Unremarkable. No pancreatic ductal dilatation or surrounding inflammatory changes. Spleen: Normal in size without focal abnormality. Adrenals/Urinary Tract: Mild thickening of the adrenal glands with no evidence of discrete nodule. Cysts are noted in the kidneys bilaterally. There are tiny subcentimeter hyperdense structures in the kidneys bilaterally, possible hemorrhagic or proteinaceous cysts. No renal calculus or hydronephrosis. There is a slightly hyperdense lesion in the upper pole the right kidney measuring 2.4 cm. A Foley catheter is noted in the urinary bladder and the bladder is nondistended. Stomach/Bowel: Stomach is within normal limits. Appendix is not seen. No evidence of bowel wall thickening, distention, or inflammatory changes. No free air or pneumatosis. A few scattered diverticular present along the colon without evidence of diverticulitis. A moderate amount of retained stool is present in the colon. Vascular/Lymphatic: Aortic atherosclerosis. A stable mass is noted in the  retroperitoneum on the left measuring 7.1 x 5.5 cm, compatible with known paraganglioma. No abdominal or pelvic lymphadenopathy. Reproductive: The prostate gland is enlarged. Other: There is a small left inguinal hernia containing nonobstructed bowel. Mesenteric edema is noted. No abdominopelvic ascites. Musculoskeletal: Degenerative changes are present in the thoracolumbar spine. No acute osseous abnormality. IMPRESSION: 1. No acute intra-abdominal process. 2. Moderate amount of retained stool in the colon suggesting constipation. 3. Loculated pleural effusions bilaterally with strandy atelectasis or infiltrate, not significantly changed from the prior exam. 4. Stable retroperitoneal mass on the left, previously characterized as paraganglioma by history. 5. Bilateral renal cysts. There is a complex lesion in the upper pole of the right kidney measuring 2.4 cm. Ultrasound is recommended for further characterization on follow-up. 6. Aortic atherosclerosis. 7. Remaining incidental findings as described above. Electronically Signed   By: Brett Fairy M.D.   On: 02/11/2022 02:00   DG Chest Portable 1 View  Result Date: 02/11/2022 CLINICAL DATA:  Status post central line placement EXAM: PORTABLE CHEST 1 VIEW COMPARISON:  Chest x-ray from the previous day. FINDINGS: Cardiac shadow is enlarged but stable. New right jugular central line is noted at the cavoatrial junction. Stable effusions are seen right greater than left as well as basilar airspace disease right greater than left. No pneumothorax is seen. IMPRESSION: No pneumothorax following right jugular central line placement Remainder of the exam is stable. Electronically Signed   By: Inez Catalina M.D.   On: 02/11/2022 00:28   DG Chest Port 1 View  Result Date: 02/27/2022 CLINICAL DATA:  Weakness. EXAM: PORTABLE CHEST 1 VIEW COMPARISON:  December 13, 2021 FINDINGS: The heart size and mediastinal contours are within normal limits. There is calcification of the aortic  arch. Persistent marked severity multifocal airspace disease is seen within the mid and lower lungs. Stable bilateral pleural effusions are also noted, right greater than left. No pneumothorax is identified. The visualized skeletal structures are unremarkable. IMPRESSION: 1. Persistent marked severity multifocal airspace disease with stable bilateral pleural effusions, right greater than left. Electronically Signed   By: Virgina Norfolk M.D.   On: 02/21/2022 21:45    Labs: BMET Recent Labs  Lab 02/08/22 0811 03/09/2022 2130 02/09/2022 2140 02/11/22 0000 02/11/22 0250 02/11/22 0845 02/11/22 1351 02/11/22 1836 02/12/22 0054 02/12/22 0607  NA 136 140 135  135 127*  --  137  --   --   --  133*  K  4.5 6.9* 6.6*  6.6* 5.8* 6.7* 5.3*  5.3* 5.7* 5.7* 3.4* 4.1  CL 101 100 104 91*  --  98  --   --   --  93*  CO2  --  18*  --  14*  --  20*  --   --   --  26  GLUCOSE 148* 63* 59* 792* 97 76  --   --   --  121*  BUN 129* 122* >130* 118*  --  85*  --   --   --  55*  CREATININE 5.00* 5.57* 6.00* 5.32*  --  4.02*  --   --   --  2.62*  CALCIUM  --  8.8*  --  8.5*  --  8.3*  --   --   --  8.1*  PHOS  --   --   --   --   --   --   --   --   --  6.1*   CBC Recent Labs  Lab 02/09/2022 2130 02/15/2022 2140 02/11/22 0000 02/11/22 0845 02/12/22 0607  WBC 5.1  --  7.5 7.4 9.8  NEUTROABS 4.4  --   --   --   --   HGB 6.8* 6.5*  6.8* 6.1* 7.1* 6.4*  HCT 21.5* 19.0*  20.0* 20.5* 21.5* 18.6*  MCV 92.3  --  98.1 88.8 87.3  PLT 170  --  132* 83* PENDING    Medications:     sodium chloride   Intravenous Once   apixaban  2.5 mg Oral BID   Chlorhexidine Gluconate Cloth  6 each Topical Q0600   Chlorhexidine Gluconate Cloth  6 each Topical Q0600   [START ON 02/15/2022] darbepoetin (ARANESP) injection - DIALYSIS  200 mcg Intravenous Q Sat-HD   feeding supplement (NEPRO CARB STEADY)  237 mL Oral TID BM   insulin aspart  0-6 Units Subcutaneous Q4H   multivitamin  1 tablet Oral QHS   senna  1 tablet Oral  Daily   sodium chloride flush  10-40 mL Intracatheter Q12H      Otelia Santee, MD 02/12/2022, 7:59 AM

## 2022-02-13 ENCOUNTER — Inpatient Hospital Stay (HOSPITAL_COMMUNITY): Payer: Medicare Other

## 2022-02-13 ENCOUNTER — Other Ambulatory Visit: Payer: Self-pay | Admitting: Family Medicine

## 2022-02-13 ENCOUNTER — Encounter (HOSPITAL_COMMUNITY): Admission: EM | Disposition: E | Payer: Self-pay | Source: Home / Self Care | Attending: Internal Medicine

## 2022-02-13 DIAGNOSIS — I5032 Chronic diastolic (congestive) heart failure: Secondary | ICD-10-CM | POA: Diagnosis not present

## 2022-02-13 DIAGNOSIS — Z992 Dependence on renal dialysis: Secondary | ICD-10-CM | POA: Diagnosis not present

## 2022-02-13 DIAGNOSIS — I10 Essential (primary) hypertension: Secondary | ICD-10-CM | POA: Diagnosis present

## 2022-02-13 DIAGNOSIS — K7200 Acute and subacute hepatic failure without coma: Secondary | ICD-10-CM | POA: Diagnosis not present

## 2022-02-13 DIAGNOSIS — I480 Paroxysmal atrial fibrillation: Secondary | ICD-10-CM | POA: Diagnosis present

## 2022-02-13 DIAGNOSIS — D447 Neoplasm of uncertain behavior of aortic body and other paraganglia: Secondary | ICD-10-CM | POA: Diagnosis present

## 2022-02-13 DIAGNOSIS — E43 Unspecified severe protein-calorie malnutrition: Secondary | ICD-10-CM | POA: Diagnosis not present

## 2022-02-13 DIAGNOSIS — N186 End stage renal disease: Secondary | ICD-10-CM

## 2022-02-13 DIAGNOSIS — K7290 Hepatic failure, unspecified without coma: Secondary | ICD-10-CM | POA: Diagnosis present

## 2022-02-13 LAB — CBC
HCT: 21.3 % — ABNORMAL LOW (ref 39.0–52.0)
Hemoglobin: 7.2 g/dL — ABNORMAL LOW (ref 13.0–17.0)
MCH: 29.8 pg (ref 26.0–34.0)
MCHC: 33.8 g/dL (ref 30.0–36.0)
MCV: 88 fL (ref 80.0–100.0)
Platelets: 80 10*3/uL — ABNORMAL LOW (ref 150–400)
RBC: 2.42 MIL/uL — ABNORMAL LOW (ref 4.22–5.81)
RDW: 16 % — ABNORMAL HIGH (ref 11.5–15.5)
WBC: 13 10*3/uL — ABNORMAL HIGH (ref 4.0–10.5)
nRBC: 1.5 % — ABNORMAL HIGH (ref 0.0–0.2)

## 2022-02-13 LAB — BPAM PLATELET PHERESIS
Blood Product Expiration Date: 202308062359
ISSUE DATE / TIME: 202308041550
Unit Type and Rh: 6200

## 2022-02-13 LAB — COMPREHENSIVE METABOLIC PANEL
ALT: 1045 U/L — ABNORMAL HIGH (ref 0–44)
AST: 3065 U/L — ABNORMAL HIGH (ref 15–41)
Albumin: 2.5 g/dL — ABNORMAL LOW (ref 3.5–5.0)
Alkaline Phosphatase: 1049 U/L — ABNORMAL HIGH (ref 38–126)
Anion gap: 16 — ABNORMAL HIGH (ref 5–15)
BUN: 91 mg/dL — ABNORMAL HIGH (ref 8–23)
CO2: 24 mmol/L (ref 22–32)
Calcium: 7.8 mg/dL — ABNORMAL LOW (ref 8.9–10.3)
Chloride: 93 mmol/L — ABNORMAL LOW (ref 98–111)
Creatinine, Ser: 3.7 mg/dL — ABNORMAL HIGH (ref 0.61–1.24)
GFR, Estimated: 16 mL/min — ABNORMAL LOW (ref >60)
Glucose, Bld: 120 mg/dL — ABNORMAL HIGH (ref 70–99)
Potassium: 4.8 mmol/L (ref 3.5–5.1)
Sodium: 133 mmol/L — ABNORMAL LOW (ref 135–145)
Total Bilirubin: 4.1 mg/dL — ABNORMAL HIGH (ref 0.3–1.2)
Total Protein: 5 g/dL — ABNORMAL LOW (ref 6.5–8.1)

## 2022-02-13 LAB — TYPE AND SCREEN
ABO/RH(D): A POS
Antibody Screen: NEGATIVE
Unit division: 0
Unit division: 0

## 2022-02-13 LAB — BPAM RBC
Blood Product Expiration Date: 202308102359
Blood Product Expiration Date: 202308302359
ISSUE DATE / TIME: 202308030356
ISSUE DATE / TIME: 202308040900
Unit Type and Rh: 6200
Unit Type and Rh: 6200

## 2022-02-13 LAB — URINE CULTURE: Culture: 30000 — AB

## 2022-02-13 LAB — GLUCOSE, CAPILLARY
Glucose-Capillary: 108 mg/dL — ABNORMAL HIGH (ref 70–99)
Glucose-Capillary: 125 mg/dL — ABNORMAL HIGH (ref 70–99)
Glucose-Capillary: 106 mg/dL — ABNORMAL HIGH (ref 70–99)
Glucose-Capillary: 105 mg/dL — ABNORMAL HIGH (ref 70–99)
Glucose-Capillary: 99 mg/dL (ref 70–99)
Glucose-Capillary: 113 mg/dL — ABNORMAL HIGH (ref 70–99)

## 2022-02-13 LAB — PROTIME-INR
INR: 2.3 — ABNORMAL HIGH (ref 0.8–1.2)
Prothrombin Time: 25.2 seconds — ABNORMAL HIGH (ref 11.4–15.2)

## 2022-02-13 LAB — PREPARE PLATELET PHERESIS: Unit division: 0

## 2022-02-13 LAB — HEPATITIS A ANTIBODY, IGM: Hep A IgM: NONREACTIVE

## 2022-02-13 LAB — HEPATITIS B CORE ANTIBODY, IGM: Hep B C IgM: NONREACTIVE

## 2022-02-13 LAB — MAGNESIUM: Magnesium: 2.1 mg/dL (ref 1.7–2.4)

## 2022-02-13 SURGERY — ENTEROSCOPY
Anesthesia: Monitor Anesthesia Care

## 2022-02-13 MED ORDER — BISACODYL 5 MG PO TBEC
20.0000 mg | DELAYED_RELEASE_TABLET | Freq: Once | ORAL | Status: AC
Start: 2022-02-13 — End: 2022-02-13
  Administered 2022-02-13: 20 mg via ORAL
  Filled 2022-02-13 (×2): qty 4

## 2022-02-13 MED ORDER — POLYETHYLENE GLYCOL 3350 17 G PO PACK
17.0000 g | PACK | Freq: Two times a day (BID) | ORAL | Status: DC
  Administered 2022-02-13 – 2022-02-20 (×7): 17 g via ORAL
  Filled 2022-02-13 (×11): qty 1

## 2022-02-13 MED ORDER — SODIUM CHLORIDE 0.9 % IV SOLN
1.0000 g | INTRAVENOUS | Status: AC
  Administered 2022-02-13 – 2022-02-18 (×5): 1 g via INTRAVENOUS
  Filled 2022-02-13 (×6): qty 10

## 2022-02-13 MED ORDER — SENNA 8.6 MG PO TABS
2.0000 | ORAL_TABLET | Freq: Every day | ORAL | Status: DC
  Administered 2022-02-14 – 2022-02-15 (×2): 17.2 mg via ORAL
  Filled 2022-02-13 (×2): qty 2

## 2022-02-13 NOTE — Progress Notes (Signed)
HD tx complete. Start at 1558 and end at [employer]. Goal of 1L fluid removal met and 72L BVP. Bed says UTA weight. Pt alert, no c/o, vss, report to primary RN. Catheter locked with sodium citrate.

## 2022-02-13 NOTE — Assessment & Plan Note (Addendum)
Persistent elevation in liver enzymes,  AST 1.180, ALT 667  Worsening T Bil at 5.3 Plt 109 INR 1.7  Patient with liver failure with high risk of worsening, Poor prognosis, not candidate for aggressive therapy due to multiorgan failure.  Case discussed with Dr [name] from GI  Continue close follow up on liver function test.

## 2022-02-13 NOTE — Progress Notes (Signed)
Post HD RN assessment 

## 2022-02-13 NOTE — Progress Notes (Signed)
[facility] Progress Note   76M progressive stage V CKD followed by Dr. 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 Found to be in junctional bradycardia with a heart rate in the 30s, potassium 6.6, QRS prolonged at 150. Tx medically.  Had been hesitant to initiate dialysis but has been willing to receive it when it came to a life or death situation.   Assessment/ Plan:   76M with advanced CKD 5 progressed senting with junctional bradycardia, progressive dyspnea, worsened renal failure, severe anemia. 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 S/p RUE AVG placement 02/08/22 [facility]. Looks like a very high stick with the RIJ temp; should get a lower stick for new TC. - S/P HD #1 very early 8/3, 2nd treatment very early 8/4. K stable now after 2 treatments.  Next HD today, TC conversion Monday and then follow TTS regimen.  - Renal navigator onboard and already working on CLIP.  Junctional bradycardia secondary to hyperkalemia while on amiodarone and diltiazem -> resolved with dialysis. Anemia, severe, on ESA therapy as outpatient. ESA but no iron given high ferritin. 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  HFpEF - compensated DM2 - per primary Hypertension, blood pressure stable   Subjective:   Denies f/c/n/v/dyspnea/ CP. But still poor appetite. Epistaxis yesterday tx by ENT and currently appears to be stable.   Objective:   BP (!) 131/56 (BP Location: Left Arm)   Pulse 82   Temp 98.8 F (37.1 C) (Oral)   Resp 11    Ht '5\' 6"'$  (1.676 m)   Wt 53.4 kg   SpO2 92%   BMI 19.00 kg/m   Intake/Output Summary (Last 24 hours) at 02/09/2022 1610 Last data filed at 02/12/2022 0700 Gross per 24 hour  Intake 720.07 ml  Output 205 ml  Net 515.07 ml   Weight change:   Physical Exam: GEN: Unwell appearing,  thin/cachectic ENT: NCAT EYES: EOMI CV: regular rhythm PULM: Appears to be comfortable ABD: Soft, nontender SKIN: No rashes or lesions EXT: no lower extremity edema ACCESS: RUA AVG + thrill    Imaging: ECHOCARDIOGRAM LIMITED  Result Date: 02/11/2022    ECHOCARDIOGRAM LIMITED REPORT   Patient Name:   Adam Reid Date of Exam: 02/11/2022 Medical Rec #:  [id number]        Height:       66.0 in Accession #:    [id number]       Weight:       120.4 lb Date of Birth:  [birth year]        BSA:          1.612 m Patient Age:    76 years         BP:           136/56 mmHg Patient Gender: M                HR:           92 bpm. Exam Location:  Inpatient Procedure: Limited Echo, Color Doppler and Cardiac Doppler Indications:    R94.31 Abnormal  EKG  History:        Patient has prior history of Echocardiogram examinations, most                 recent 11/14/2021. CHF, Arrythmias:Atrial Fibrillation; Risk                 Factors:Hypertension, Diabetes, Dyslipidemia and CKD.  Sonographer:    Raquel Sarna Senior RDCS Referring Phys: [id number] Francesca Jewett  Sonographer Comments: Exam performed sitting mostly upright due to dypsnea. IMPRESSIONS  1. Left ventricular ejection fraction, by estimation, is 55 to 60%. The left ventricle has normal function.  2. Right ventricular systolic function is normal. The right ventricular size is moderately enlarged. There is moderately elevated pulmonary artery systolic pressure.  3. Left atrial size was mildly dilated.  4. The mitral valve is normal in structure. Mild mitral valve regurgitation. No evidence of mitral stenosis.  5. Tricuspid valve regurgitation is moderate.  6. The aortic valve is tricuspid.  7.  The inferior vena cava is dilated in size with <50% respiratory variability, suggesting right atrial pressure of 15 mmHg. FINDINGS  Left Ventricle: Left ventricular ejection fraction, by estimation, is 55 to 60%. The left ventricle has normal function. Right Ventricle: The right ventricular size is moderately enlarged. Right ventricular systolic function is normal. There is moderately elevated pulmonary artery systolic pressure. The tricuspid regurgitant velocity is 3.18 m/s, and with an assumed right atrial pressure of 15 mmHg, the estimated right ventricular systolic pressure is 60.6 mmHg. Left Atrium: Left atrial size was mildly dilated. Mitral Valve: The mitral valve is normal in structure. Mild mitral valve regurgitation. No evidence of mitral valve stenosis. Tricuspid Valve: The tricuspid valve is normal in structure. Tricuspid valve regurgitation is moderate . No evidence of tricuspid stenosis. Aortic Valve: The aortic valve is tricuspid. Pulmonic Valve: The pulmonic valve was normal in structure. Pulmonic valve regurgitation is not visualized. No evidence of pulmonic stenosis. Aorta: Aortic root could not be assessed. Venous: The inferior vena cava is dilated in size with less than 50% respiratory variability, suggesting right atrial pressure of 15 mmHg. LEFT VENTRICLE PLAX 2D LVOT diam:     2.00 cm LV SV:         61 LV SV Index:   38 LVOT Area:     3.14 cm  RIGHT VENTRICLE RV S prime:     17.40 cm/s TAPSE (M-mode): 2.2 cm AORTIC VALVE LVOT Vmax:   119.00 cm/s LVOT Vmean:  71.200 cm/s LVOT VTI:    0.195 m TRICUSPID VALVE TR Peak grad:   40.4 mmHg TR Vmax:        318.00 cm/s  SHUNTS Systemic VTI:  0.20 m Systemic Diam: 2.00 cm Kardie Tobb DO Electronically signed by Berniece Salines DO Signature Date/Time: 02/11/2022/12:34:17 PM    Final     Labs: BMET Recent Labs  Lab 02/08/22 3016 02/26/2022 2130 03/10/2022 2140 02/11/22 0000 02/11/22 0250 02/11/22 0845 02/11/22 1351 02/11/22 1836 02/12/22 0054  02/12/22 0607 03/08/2022 0349  NA 136 140 135  135 127*  --  137  --   --   --  133* 133*  K 4.5 6.9* 6.6*  6.6* 5.8* 6.7* 5.3*  5.3* 5.7* 5.7* 3.4* 4.1 4.8  CL 101 100 104 91*  --  98  --   --   --  93* 93*  CO2  --  18*  --  14*  --  20*  --   --   --  26 24  GLUCOSE 148* 63* 59* 792* 97 76  --   --   --  121* 120*  BUN 129* 122* >130* 118*  --  85*  --   --   --  55* 91*  CREATININE 5.00* 5.57* 6.00* 5.32*  --  4.02*  --   --   --  2.62* 3.70*  CALCIUM  --  8.8*  --  8.5*  --  8.3*  --   --   --  8.1* 7.8*  PHOS  --   --   --   --   --   --   --   --   --  6.1*  --    CBC Recent Labs  Lab 02/09/2022 2130 02/23/2022 2140 02/11/22 0845 02/12/22 0607 02/12/22 1541 02/16/2022 0349  WBC 5.1   < > 7.4 9.8 10.9* 13.0*  NEUTROABS 4.4  --   --   --   --   --   HGB 6.8*   < > 7.1* 6.4* 7.5* 7.2*  HCT 21.5*   < > 21.5* 18.6* 22.6* 21.3*  MCV 92.3   < > 88.8 87.3 88.3 88.0  PLT 170   < > 83* 39* 44* 80*   < > = values in this interval not displayed.    Medications:     sodium chloride   Intravenous Once   sodium chloride   Intravenous Once   sodium chloride   Intravenous Once   amiodarone  200 mg Oral Daily   Chlorhexidine Gluconate Cloth  6 each Topical Q0600   Chlorhexidine Gluconate Cloth  6 each Topical Q0600   darbepoetin (ARANESP) injection - DIALYSIS  200 mcg Intravenous Q Sat-HD   diltiazem  180 mg Oral Daily   insulin aspart  0-6 Units Subcutaneous Q4H   multivitamin  1 tablet Oral QHS   oxymetazoline  1 spray Each Nare BID   pantoprazole (PROTONIX) IV  40 mg Intravenous Q12H   senna  1 tablet Oral Daily   sodium chloride flush  10-40 mL Intracatheter Q12H      Otelia Santee, MD 03/08/2022, 8:21 AM

## 2022-02-13 NOTE — Progress Notes (Signed)
Pt HD started at 1558. Pt c/o nausea. Primary MD present and RN notified. Safety maintained. Vss.

## 2022-02-13 NOTE — Assessment & Plan Note (Addendum)
Patient with volume overload and renal failure Echocardiogram with preserved LV systolic function 55 to 99%, RV systolic function is preserved. RVSP 55.4. Moderate TR   Systolic blood pressure 872 to 140 mmHg.   Plan to continue ultrafiltration as tolerated.  Close blood pressure monitoring.

## 2022-02-13 NOTE — Assessment & Plan Note (Signed)
Continue with nutrition supplements as tolerated.

## 2022-02-13 NOTE — Assessment & Plan Note (Addendum)
Hyperkalemia and hyponatremia.   Patient with progressive renal failure. SP initiation of renal replacement therapy, had 3 consecutive days with good toleration.   Today he continue to have edema in his lower extremities Na 132, K 4,3 and serum bicarbonate at 24.  Blood pressure systolic is 672 to 550 mmHg.   Plan to continue HD tomorrow. Follow nephrology recommendations, possible schedule M-W-F.  Will need a tunneled HD catheter for routine hemodialysis.

## 2022-02-13 NOTE — Assessment & Plan Note (Signed)
Poor prognosis Consultation in [facility] hematology, patient not a surgical or medical candidate due to extensive comorbid conditions.

## 2022-02-13 NOTE — Consult Note (Signed)
Chief Complaint: Renal failure  Referring Physician(s): Otelia Santee  Supervising Physician: Ruthann Cancer  Patient Status: [facility] - In-pt  History of Present Illness: Adam Reid is a 76 y.o. male with progressive Kidney disease.  He underwent right upper arm AV graft placement 7/31.  He presented to the ED on 02/11/22 because of progressive fatigue, weakness, and dyspnea.   He was found to be in junctional bradycardia with a heart rate in the 30s.  He was hyperkalemic with a potassium 6.6.  Per his son who is at the bedside, he did not want to start hemodialysis until it became a life or death situation.   His graft is not quite ready to use so we are asked to place a tunneled hemodialysis catheter.  Past Medical History:  Diagnosis Date   Anemia    Anxiety    BPH (benign prostatic hyperplasia)    Cancer (HCC)    CHF (congestive heart failure) (HCC)    Chronic kidney disease    Depression    Diabetes mellitus without complication (HCC)    Type II   Dyspnea    ESRD (end stage renal disease) (HCC)    GERD (gastroesophageal reflux disease)    Heart murmur    History of blood transfusion    Hyperlipidemia    Hypertension    PAF (paroxysmal atrial fibrillation) ([place])    Thyroid disease    was on supplement, taken off by [name]    Past Surgical History:  Procedure Laterality Date   A/V FISTULAGRAM Right 01/29/2022   Procedure: A/V Fistulagram;  Surgeon: [name], MD;  Location: [place] [facility];  Service: Cardiovascular;  Laterality: Right;   APPENDECTOMY     AV FISTULA PLACEMENT Right 09/04/2021   Procedure: RIGHT BRACHIOCEPHALIC ARTERIOVENOUS (AV) FISTULA CREATION;  Surgeon: [name], MD;  Location: [place];  Service: Vascular;  Laterality: Right;   [place] Right 02/08/2022   Procedure: RIGHT UPPER ARM GORE-TEX GRAFT INSERTION USING 4-24mm STRETCH GORETEX GRAFT;  Surgeon: [name], MD;  Location: [place];  Service: Vascular;  Laterality: Right;   BIOPSY  12/25/2020   Procedure: BIOPSY;  Surgeon: [name]., MD;  Location: [place];  Service: Gastroenterology;;   CARDIAC CATHETERIZATION N/A 01/30/2016   Procedure: Left Heart Cath and Coronary Angiography;  Surgeon: [name], MD;  Location: [place] [facility];  Service: Cardiovascular;  Laterality: N/A;   ENTEROSCOPY N/A 12/25/2020   Procedure: ENTEROSCOPY;  Surgeon: [name], MD;  Location: [place];  Service: Gastroenterology;  Laterality: N/A;   GIVENS CAPSULE STUDY N/A 12/25/2020   Procedure: GIVENS CAPSULE STUDY;  Surgeon: [name]., MD;  Location: [place];  Service: Gastroenterology;  Laterality: N/A;   SUBMUCOSAL TATTOO INJECTION  12/25/2020   Procedure: SUBMUCOSAL TATTOO INJECTION;  Surgeon: [name], MD;  Location: [place];  Service: Gastroenterology;;    Allergies: Pork-derived products  Medications: Prior to Admission medications   Medication Sig Start Date End Date Taking? Authorizing Provider  amiodarone (PACERONE) 200 MG tablet Take 1 tablet (200 mg total) by mouth daily. Follow up appt required for further refills. Please call [phone] to schedule an appt Patient taking differently: Take 200 mg by mouth daily. 11/30/21   Clegg, Amy D, NP  atorvastatin (LIPITOR) 80 MG tablet TAKE 1 TABLET(80 MG) BY MOUTH DAILY Patient taking differently: Take 80 mg by mouth daily. 12/22/20   [name], MD  bisacodyl (DULCOLAX) 10 MG suppository Place 1 suppository (10 mg total) rectally as needed for moderate constipation. 12/31/21   Standley Brooking, MD  blood glucose meter kit and supplies Use up to two times daily as directed  ICD10 E10.9 E11.9 01/18/22   Shade Flood, MD  blood glucose meter kit and supplies Dispense based on patient and insurance preference. Use up to four times daily as directed. (FOR ICD-10 E10.9, E11.9). 02/08/22   Shade Flood, MD  CALPHRON 667 MG tablet Take 1,334 mg by mouth 3 (three) times daily with meals. 11/09/21   [provider]  Continuous Blood Gluc Receiver (FREESTYLE LIBRE 2 READER) DEVI 1 Application by Does not apply route every 14 (fourteen) days. 02/08/22   Shade Flood, MD  diltiazem (CARDIZEM CD) 180 MG 24 hr capsule TAKE 1 CAPSULE(180 MG) BY MOUTH DAILY Patient taking differently: Take 180 mg by mouth daily. 08/24/21   Bensimhon, Bevelyn Buckles, MD  doxazosin (CARDURA) 4 MG tablet Take 1 tablet (4 mg total) by mouth daily. 12/24/21 01/23/22  Shahmehdi, Gemma Payor, MD  ELIQUIS 2.5 MG TABS tablet TAKE 1 TABLET(2.5 MG) BY MOUTH TWICE DAILY Patient taking differently: Take 2.5 mg by mouth 2 (two) times daily. 10/30/21   Bensimhon, Bevelyn Buckles, MD  GlucoCom Lancets MISC Use for home glucose monitoring 09/02/15   Porfirio Oar, PA  glucose blood (ONETOUCH VERIO) test strip Test up to 2 times per day.  Uncontrolled diabetes with hyperglycemia and stage 4 CKD. 04/13/19   Shade Flood, MD  HYDROmorphone (DILAUDID) 2 MG tablet Take 0.5 tablets (1 mg total) by mouth every 12 (twelve) hours as needed for moderate pain. 12/31/21   Standley Brooking, MD  insulin glargine (LANTUS) 100 UNIT/ML Solostar Pen Inject 5 Units into the skin daily. Patient taking differently: Inject 10 Units into the skin daily. 01/26/22   Shade Flood, MD  Insulin Pen Needle 31G X 5 MM MISC Use as directed 12/31/21   Standley Brooking, MD  lactulose (CHRONULAC) 10 GM/15ML solution Take 15 mLs (10 g total) by mouth 2 (two) times daily. Patient taking differently: Take 10 g by mouth 2 (two) times daily as needed for moderate constipation. 12/31/21   Standley Brooking, MD  oxyCODONE-acetaminophen (PERCOCET) 5-325 MG tablet Take 1 tablet by mouth every 4 (four) hours as needed for severe pain. 02/08/22 02/08/23  Chuck Hint, MD  OXYGEN Inhale 2.5 L into the lungs continuous.    [provider]  sitaGLIPtin  (JANUVIA) 25 MG tablet TAKE 1 TABLET(25 MG) BY MOUTH DAILY Patient taking differently: Take 25 mg by mouth daily. 08/31/21   Shade Flood, MD  torsemide (DEMADEX) 20 MG tablet Take 2 tablets (40 mg total) by mouth 2 (two) times daily. Patient taking differently: Take 60 mg by mouth 2 (two) times daily. 10/05/21   Milford, Anderson Malta, FNP  vitamin B-12 (CYANOCOBALAMIN) 500 MCG tablet Take 500 mcg by mouth daily.    [provider]     Family History  Problem Relation Age of Onset   Hypertension Mother    Hyperlipidemia Mother    Hypertension Sister    Kidney disease Father    Heart disease Brother 67       open heart surgery   Colon cancer Neg Hx     Social History   Socioeconomic History   Marital status: Married    Spouse name: [name]   Number of children: 1  Years of education: 12th grade   Highest education level: Not on file  Occupational History   Occupation: Retired-accountant  Tobacco Use   Smoking status: Some Days    Packs/day: 0.10    Years: 48.00    Total pack years: 4.80    Types: Cigarettes    Last attempt to quit: 07/16/2019    Years since quitting: 2.5   Smokeless tobacco: Never   Tobacco comments:    09/03/21 Wife states, "he hides from me, so I am not sure how much he smokes. " Wife says he smokes occassionally 4 cigs a day."  Vaping Use   Vaping Use: Never used  Substance and Sexual Activity   Alcohol use: No    Alcohol/week: 0.0 standard drinks of alcohol   Drug use: No   Sexual activity: Never    Partners: Female  Other Topics Concern   Not on file  Social History Narrative   Originally from [place]. Came to the [place] in 2009, following their son who came here for school.   Married.   Lives with his wife.   Their adult son lives in [place], [place], where he is in optometry school.   Education: [employer]   Exercise: No   Social Determinants of Radio broadcast assistant Strain: Not on file  Food Insecurity: Not on file  Transportation  Needs: Not on file  Physical Activity: Not on file  Stress: Not on file  Social Connections: Not on file     Review of Systems: A 12 point ROS discussed and pertinent positives are indicated.  All other systems are negative.  Review of Systems  Constitutional:  Positive for activity change, appetite change and fatigue.  Gastrointestinal:  Positive for nausea and vomiting.    Vital Signs: BP (!) 143/51   Pulse 84   Temp 98.8 F (37.1 C) (Oral)   Resp 19   Ht $R'5\' 6"'FA$  (1.676 m)   Wt 117 lb 11.6 oz (53.4 kg)   SpO2 100%   BMI 19.00 kg/m   Physical Exam Constitutional:      Appearance: Normal appearance.  HENT:     Head: Normocephalic and atraumatic.  Neck:     Comments: Right IJ temp cath in place Cardiovascular:     Rate and Rhythm: Normal rate and regular rhythm.  Pulmonary:     Effort: Pulmonary effort is normal. No respiratory distress.  Abdominal:     Palpations: Abdomen is soft.  Musculoskeletal:     Comments: Right UE AV graft with healing incision. Some ecchymosis.   Skin:    General: Skin is warm and dry.  Neurological:     General: No focal deficit present.     Mental Status: He is alert and oriented to person, place, and time.  Psychiatric:        Mood and Affect: Mood normal.        Behavior: Behavior normal.     Imaging: US LIVER DOPPLER  Result Date: 03/03/2022 CLINICAL DATA:  76 year old male with history of liver failure. EXAM: DUPLEX ULTRASOUND OF LIVER TECHNIQUE: Color and duplex Doppler ultrasound was performed to evaluate the hepatic in-flow and out-flow vessels. COMPARISON:  CT abdomen pelvis from 02/11/2022 FINDINGS: Liver: Normal parenchymal echogenicity. Normal hepatic contour without nodularity. No focal lesion, mass or intrahepatic biliary ductal dilatation. Main Portal Vein size: 1.5 cm Portal Vein Velocities Main Prox:  41 cm/sec Main Mid: 27 cm/sec Main Dist:  38 cm/sec Right: 63 cm/sec Left: 21  cm/sec All visualized portal venous flow is  antegrade/hepatopetal. Hepatic Vein Velocities Right:  25 cm/sec Middle:  41 cm/sec Left:  38 cm/sec IVC: Present and patent with normal respiratory phasicity. Hepatic Artery Velocity:  241 cm/sec Splenic Vein Velocity:  43 cm/sec Spleen: 3.8 cm x 7.6 cm x 2.8 cm with a total volume of 42 cm^3 (411 cm^3 is upper limit normal) Portal Vein Occlusion/Thrombus: No Splenic Vein Occlusion/Thrombus: No Ascites: Trace Varices: None Right greater than left bilateral pleural effusions are noted. IMPRESSION: 1. Patent hepatic vasculature with normal direction of flow. 2. Normal sonographic appearance of the liver. 3. Trace ascites. 4. Right greater than left bilateral pleural effusions. Ruthann Cancer, MD [facility] Electronically Signed   By: Ruthann Cancer M.D.   On: 02/18/2022 11:01   ECHOCARDIOGRAM LIMITED  Result Date: 02/11/2022    ECHOCARDIOGRAM LIMITED REPORT   Patient Name:   SHAMIR TUZZOLINO Date of Exam: 02/11/2022 Medical Rec #:  [id number]        Height:       66.0 in Accession #:    [id number]       Weight:       120.4 lb Date of Birth:  1946-05-29        BSA:          1.612 m Patient Age:    47 years         BP:           136/56 mmHg Patient Gender: M                HR:           92 bpm. Exam Location:  Inpatient Procedure: Limited Echo, Color Doppler and Cardiac Doppler Indications:    R94.31 Abnormal EKG  History:        Patient has prior history of Echocardiogram examinations, most                 recent 11/14/2021. CHF, Arrythmias:Atrial Fibrillation; Risk                 Factors:Hypertension, Diabetes, Dyslipidemia and CKD.  Sonographer:    Raquel Sarna Senior RDCS Referring Phys: [id number] Francesca Jewett  Sonographer Comments: Exam performed sitting mostly upright due to dypsnea. IMPRESSIONS  1. Left ventricular ejection fraction, by estimation, is 55 to 60%. The left ventricle has normal function.  2. Right ventricular systolic function is normal. The right  ventricular size is moderately enlarged. There is moderately elevated pulmonary artery systolic pressure.  3. Left atrial size was mildly dilated.  4. The mitral valve is normal in structure. Mild mitral valve regurgitation. No evidence of mitral stenosis.  5. Tricuspid valve regurgitation is moderate.  6. The aortic valve is tricuspid.  7. The inferior vena cava is dilated in size with <50% respiratory variability, suggesting right atrial pressure of 15 mmHg. FINDINGS  Left Ventricle: Left ventricular ejection fraction, by estimation, is 55 to 60%. The left ventricle has normal function. Right Ventricle: The right ventricular size is moderately enlarged. Right ventricular systolic function is normal. There is moderately elevated pulmonary artery systolic pressure. The tricuspid regurgitant velocity is 3.18 m/s, and with an assumed right atrial pressure of 15 mmHg, the estimated right ventricular systolic pressure is 11.0 mmHg. Left Atrium: Left atrial size was mildly dilated. Mitral Valve: The mitral valve is normal in structure. Mild mitral valve regurgitation. No evidence of mitral valve stenosis. Tricuspid Valve: The tricuspid valve is  normal in structure. Tricuspid valve regurgitation is moderate . No evidence of tricuspid stenosis. Aortic Valve: The aortic valve is tricuspid. Pulmonic Valve: The pulmonic valve was normal in structure. Pulmonic valve regurgitation is not visualized. No evidence of pulmonic stenosis. Aorta: Aortic root could not be assessed. Venous: The inferior vena cava is dilated in size with less than 50% respiratory variability, suggesting right atrial pressure of 15 mmHg. LEFT VENTRICLE PLAX 2D LVOT diam:     2.00 cm LV SV:         61 LV SV Index:   38 LVOT Area:     3.14 cm  RIGHT VENTRICLE RV S prime:     17.40 cm/s TAPSE (M-mode): 2.2 cm AORTIC VALVE LVOT Vmax:   119.00 cm/s LVOT Vmean:  71.200 cm/s LVOT VTI:    0.195 m TRICUSPID VALVE TR Peak grad:   40.4 mmHg TR Vmax:        318.00  cm/s  SHUNTS Systemic VTI:  0.20 m Systemic Diam: 2.00 cm Kardie Tobb DO Electronically signed by Berniece Salines DO Signature Date/Time: 02/11/2022/12:34:17 PM    Final    CT ABDOMEN PELVIS WO CONTRAST  Result Date: 02/11/2022 CLINICAL DATA:  Abdominal pain, acute, nonlocalized. Left retroperitoneal paraganglioma, chronic kidney disease. EXAM: CT ABDOMEN AND PELVIS WITHOUT CONTRAST TECHNIQUE: Multidetector CT imaging of the abdomen and pelvis was performed following the standard protocol without IV contrast. RADIATION DOSE REDUCTION: This exam was performed according to the departmental dose-optimization program which includes automated exposure control, adjustment of the mA and/or kV according to patient size and/or use of iterative reconstruction technique. COMPARISON:  01/28/2022. FINDINGS: Lower chest: The heart is enlarged and there is no pericardial effusion. Scattered coronary artery calcifications are noted. There are loculated pleural effusions bilaterally with patchy infiltrates. Hepatobiliary: No focal liver abnormality. No biliary ductal dilatation. The gallbladder is not visualized on exam. Pancreas: Unremarkable. No pancreatic ductal dilatation or surrounding inflammatory changes. Spleen: Normal in size without focal abnormality. Adrenals/Urinary Tract: Mild thickening of the adrenal glands with no evidence of discrete nodule. Cysts are noted in the kidneys bilaterally. There are tiny subcentimeter hyperdense structures in the kidneys bilaterally, possible hemorrhagic or proteinaceous cysts. No renal calculus or hydronephrosis. There is a slightly hyperdense lesion in the upper pole the right kidney measuring 2.4 cm. A Foley catheter is noted in the urinary bladder and the bladder is nondistended. Stomach/Bowel: Stomach is within normal limits. Appendix is not seen. No evidence of bowel wall thickening, distention, or inflammatory changes. No free air or pneumatosis. A few scattered diverticular present  along the colon without evidence of diverticulitis. A moderate amount of retained stool is present in the colon. Vascular/Lymphatic: Aortic atherosclerosis. A stable mass is noted in the retroperitoneum on the left measuring 7.1 x 5.5 cm, compatible with known paraganglioma. No abdominal or pelvic lymphadenopathy. Reproductive: The prostate gland is enlarged. Other: There is a small left inguinal hernia containing nonobstructed bowel. Mesenteric edema is noted. No abdominopelvic ascites. Musculoskeletal: Degenerative changes are present in the thoracolumbar spine. No acute osseous abnormality. IMPRESSION: 1. No acute intra-abdominal process. 2. Moderate amount of retained stool in the colon suggesting constipation. 3. Loculated pleural effusions bilaterally with strandy atelectasis or infiltrate, not significantly changed from the prior exam. 4. Stable retroperitoneal mass on the left, previously characterized as paraganglioma by history. 5. Bilateral renal cysts. There is a complex lesion in the upper pole of the right kidney measuring 2.4 cm. Ultrasound is recommended for further characterization on follow-up.  6. Aortic atherosclerosis. 7. Remaining incidental findings as described above. Electronically Signed   By: Brett Fairy M.D.   On: 02/11/2022 02:00   DG Chest Portable 1 View  Result Date: 02/11/2022 CLINICAL DATA:  Status post central line placement EXAM: PORTABLE CHEST 1 VIEW COMPARISON:  Chest x-ray from the previous day. FINDINGS: Cardiac shadow is enlarged but stable. New right jugular central line is noted at the cavoatrial junction. Stable effusions are seen right greater than left as well as basilar airspace disease right greater than left. No pneumothorax is seen. IMPRESSION: No pneumothorax following right jugular central line placement Remainder of the exam is stable. Electronically Signed   By: Inez Catalina M.D.   On: 02/11/2022 00:28   DG Chest Port 1 View  Result Date:  02/25/2022 CLINICAL DATA:  Weakness. EXAM: PORTABLE CHEST 1 VIEW COMPARISON:  December 13, 2021 FINDINGS: The heart size and mediastinal contours are within normal limits. There is calcification of the aortic arch. Persistent marked severity multifocal airspace disease is seen within the mid and lower lungs. Stable bilateral pleural effusions are also noted, right greater than left. No pneumothorax is identified. The visualized skeletal structures are unremarkable. IMPRESSION: 1. Persistent marked severity multifocal airspace disease with stable bilateral pleural effusions, right greater than left. Electronically Signed   By: Virgina Norfolk M.D.   On: 03/02/2022 21:45   PERIPHERAL VASCULAR CATHETERIZATION  Result Date: 01/29/2022 Table formatting from the original result was not included. INDICATIONS:  Ignazio Kincaid is a 76 y.o. male who was set up for a fistulogram as his fistula was nonfunctioning.  The history is obtained through the translator and as I understand that he is not yet on dialysis.  His fistula was placed on 09/04/2021.  It was a right brachiocephalic fistula. PROCEDURE:  Fistulogram right brachiocephalic AV fistula SURGEON: Judeth Cornfield. Scot Dock, MD, FACS ANESTHESIA: Local EBL: Minimal TECHNIQUE: The patient was brought to the peripheral vascular lab and the right arm was prepped and draped in usual sterile fashion.  After the skin was anesthetized with 1% lidocaine I cannulated the vein with a micropuncture needle and a micropuncture sheath was introduced over a wire.  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  There was reflux into the basilic vein which did appear to be patent.  There was no evidence of central venous obstruction.  I did attempt to pass a wire through the fistula but this was chronically occluded and this was unsuccessful.  A 4-0 Monocryl was used to close the cannulation site and there was good hemostasis. FINDINGS: No central venous  occlusion 2.  Patent basilic vein right arm 3.  Occluded right brachiocephalic fistula CLINICAL NOTE: The patient will be scheduled for either a basilic vein transposition on the right or an AV graft.  We will discuss with nephrology if he needs a catheter.   CT Abdomen Pelvis Wo Contrast  Result Date: 01/28/2022 CLINICAL DATA:  Bowel obstruction suspected EXAM: CT ABDOMEN AND PELVIS WITHOUT CONTRAST TECHNIQUE: Multidetector CT imaging of the abdomen and pelvis was performed following the standard protocol without IV contrast. RADIATION DOSE REDUCTION: This exam was performed according to the departmental dose-optimization program which includes automated exposure control, adjustment of the mA and/or kV according to patient size and/or use of iterative reconstruction technique. COMPARISON:  CT chest, abdomen and pelvis dated December 13, 2021 FINDINGS: Lower chest: Cardiomegaly. Small loculated right-greater-than-left pleural effusions with pleural thickening. Bilateral rounded airspace opacities which are likely  due to round atelectasis. Ground-glass opacities of the lung bases with associated septal thickening. Hepatobiliary: No suspicious liver lesions. No biliary ductal dilation. Pancreas: Unremarkable. No pancreatic ductal dilatation or surrounding inflammatory changes. Spleen: Normal in size without focal abnormality. Adrenals/Urinary Tract: Thickening of the left-greater-than-right adrenal glands, similar to prior exam. No hydronephrosis or nephrolithiasis. Unchanged bilateral simple appearing and minimally complex renal cysts. Bladder is decompressed and contains a Foley catheter. Stomach/Bowel: Stomach is unremarkable. No bowel wall thickening or inflammatory change. Large stool burden seen throughout the colon. No evidence of obstruction. Vascular/Lymphatic: Aortic atherosclerosis. No definite enlarged lymph nodes seen in the abdomen or pelvis. Reproductive: Prostatomegaly. Other: Large left periaortic  lesion which appears to be centrally necrotic measuring 7.0 x 5.6 cm, unchanged in size when compared with prior exam and remeasured in similar plane. Small right inguinal hernia containing nondilated loops of small bowel. Musculoskeletal: No acute or significant osseous findings. IMPRESSION: 1. Large stool burden seen throughout the colon, compatible with constipation. No evidence of obstruction. 2. Stable size of large left retroperitoneal lesion. 3. Small right-greater-than-left chronic appearing pleural effusions. 4. Cardiomegaly and pulmonary edema. 5.  Aortic Atherosclerosis (ICD10-I70.0). Electronically Signed   By: Yetta Glassman M.D.   On: 01/28/2022 13:20    Labs:  CBC: Recent Labs    02/11/22 0845 02/12/22 0607 02/12/22 1541 03/02/2022 0349  WBC 7.4 9.8 10.9* 13.0*  HGB 7.1* 6.4* 7.5* 7.2*  HCT 21.5* 18.6* 22.6* 21.3*  PLT 83* 39* 44* 80*    COAGS: Recent Labs    03/07/2022 2130 02/11/22 0921 02/12/22 0607 03/02/2022 0349  INR 1.3* 2.3* 3.1* 2.3*    BMP: Recent Labs    02/11/22 0000 02/11/22 0250 02/11/22 0845 02/11/22 1351 02/11/22 1836 02/12/22 0054 02/12/22 0607 03/06/2022 0349  NA 127*  --  137  --   --   --  133* 133*  K 5.8* 6.7* 5.3*  5.3*   < > 5.7* 3.4* 4.1 4.8  CL 91*  --  98  --   --   --  93* 93*  CO2 14*  --  20*  --   --   --  26 24  GLUCOSE 792* 97 76  --   --   --  121* 120*  BUN 118*  --  85*  --   --   --  55* 91*  CALCIUM 8.5*  --  8.3*  --   --   --  8.1* 7.8*  CREATININE 5.32*  --  4.02*  --   --   --  2.62* 3.70*  GFRNONAA 11*  --  15*  --   --   --  25* 16*   < > = values in this interval not displayed.    LIVER FUNCTION TESTS: Recent Labs    02/22/2022 2130 02/11/22 0845 02/12/22 0607 03/01/2022 0349  BILITOT 1.2 2.6* 3.2* 4.1*  AST 1,537* 9,636* 3,070* 3,065*  ALT 743* 3,686* 2,150* 1,045*  ALKPHOS 1,966* 1,645* 1,318* 1,049*  PROT 6.6 5.9* 5.5* 5.0*  ALBUMIN 2.9* 2.5* 2.5*  2.5* 2.5*    TUMOR MARKERS: No results for  input(s): "AFPTM", "CEA", "CA199", "CHROMGRNA" in the last 8760 hours.  Assessment and Plan:  ESRD on hemodialysis  S/P recent right arm AV Graft placement - not quite ready for use.  Will proceed with image guided  placement of a tunneled hemodialysis catheter on Monday as IR schedule allows.  Risks and benefits discussed with the patient and his son  including, but not limited to bleeding, infection, vascular injury, pneumothorax which may require chest tube placement, air embolism or even death  All of the patient's questions were answered, patient is agreeable to proceed. Consent signed and in IR.  Thank you for allowing our service to participate in Huntley Knoop 's care.  Electronically Signed: Murrell Redden, PA-C   02/17/2022, 11:48 AM      I spent a total of 20 Minutes  in face to face in clinical consultation, greater than 50% of which was counseling/coordinating care for HD catheter placement.

## 2022-02-13 NOTE — Progress Notes (Signed)
Post HD vitals

## 2022-02-13 NOTE — Hospital Course (Addendum)
76 y.o. male with a history of retroperitoneal paraganglioma, stage V CKD, chronic HFpEF, PAF who presented to the ED 8/2 with weakness, leg swelling. He was found to have junctional bradycardia with hyperkalemia, acidosis, uremia, volume overload, and elevated LFTs. Imaging revealed loculated right pleural effusion. Temporary HD catheter was inserted and HD initiated urgently on 8/3. The hospitalization has been complicated by respiratory failure, liver failure and thrombocytopenia, epistaxis, coffee-ground emesis, and anemia requiring transfusions.    8/10 drawsy in bed. Came back from IR had exchange of Rt IJ .

## 2022-02-13 NOTE — Progress Notes (Signed)
Progress Note   Patient: Adam Reid [id number]:[id number] DOB: Mar 23, 1946 DOA: 02/24/2022     2 DOS: the patient was seen and examined on 02/21/2022   Brief hospital course: [name] was admitted to the hospital with the working diagnosis of worsening renal failure, and volume overload.  Now on hemodialysis.   76 yo male with past medical history of CKD stage V, heart failure, T2DM, paroxysmal atrial fibrillation and paraganglioma who presented with weakness. Reported several days of worsening weakness, fatigue and lower extremity edema. At home his HR was in the 30's and he was transported to the ED. On his initial physical examination his blood pressure was 114/48, HR 34, RR 22 and 02 saturation 97% on supplemental 02, lungs with no wheezing, heart with S1 and S2 present and bradycardic, abdomen not distended and positive lower extremity edema.   Na 140, K 6,9 CL 100 bicarbonate 18 glucose 63 bun 122 cr 5,57  ALK p 1,966 AST 1,537 ALT 743  Total Bil 1,2  Wbc 5.1 hgb 6,8 plt 170 TSH 5,515  Urine SG 1,010, 100 protein   CT abdomen and pelvis with no acute changes. Loculated pleural effusions. Positive retroperitoneal mass. No focal liver abnormalities.   Chest radiograph with right loculated pleural effusion.   EKG 40 bpm, normal axis, right bundle branch block, qtc 505, junctional rhythm with no significant ST segment or T wave changes.   08/03 CVC for HD insertion  08/03 emergent HD   Patient with worsening liver failure.   Assessment and Plan: * ESRD on dialysis ([name]) Hyperkalemia and hyponatremia.   Patient with progressive renal failure. Now on renal replacement therapy, today is his 3rd HD on 3 consecutive days,. Continue very edematous.   K is 4,8 and serum bicarbonate at 24   Continue renal replacement therapy per nephrology recommendations.   Chronic diastolic heart failure (HCC) Patient with volume overload and renal failure  Systolic blood pressure 702 to 140  mmHg.   Plan to continue ultrafiltration as tolerated.  Close blood pressure monitoring.    Hypertension Continue blood pressure monitoring, continue to hold on antihypertensive agents.   PAF (paroxysmal atrial fibrillation) (HCC) Continue diltiazem for rate control, considering liver failure will discontinue amiodarone. Continue telemetry monitoring.   Liver failure (HCC) Persistent elevation in liver enzymes, AST 3,065, ALT 1,045  Worsening T Bil at 4,1 _0 Plt 80. INR 2,3  Patient with liver failure with high risk of worsening, Poor prognosis, not candidate for aggressive therapy due to multiorgan failure.  Case discussed with Dr [name] from GI Will discontinue amiodarone for now Continue close follow up on liver function test.   Paraganglioma [facility]) Poor prognosis Consultation in [facility] hematology, patient not a surgical or medical candidate due to extensive comorbid conditions.   Protein-calorie malnutrition, severe ([place]) Continue with nutrition supplements as tolerated.         Subjective: patient very weak and deconditioned, positive nausea   Physical Exam: Vitals:   03/10/2022 1558 03/07/2022 1603 02/24/2022 1615 03/08/2022 1630  BP: (!) 130/53 (!) 114/50 (!) 148/50 (!) 122/52  Pulse:      Resp: 16     Temp:      TempSrc:      SpO2:      Weight: 53.4 kg     Height:        Neurology awake and alert Ill looking appearing and deconditioned ENT with pallor and icterus Cardiovascular with S1 and S2 present and tachycardic, positive systolic  murmur at the apex Respiratory with bilateral rales with no wheezing Abdomen distended Positive lower and upper extremity edema  Data Reviewed:    Family Communication: I spoke over the phone with the patient's son about patient's  condition, plan of care, prognosis and all questions were addressed.   Disposition: Status is: Inpatient Remains inpatient appropriate because: multiorgan failure   Planned Discharge  Destination:  to be determined       Author: Mauricio Daniel Arrien, MD 02/20/2022 4:36 PM  For on call review [email or web address].  

## 2022-02-13 NOTE — Progress Notes (Signed)
Sometime in the night the pt vomited and dislodged the nasal packing in his right nare. He has not had any signs of repeat bleeding. Informed ENT Dr. Constance Holster that the packing was removed and advised to keep as is and notify him if bleeding resumes.

## 2022-02-13 NOTE — Progress Notes (Signed)
Pre hd tx info

## 2022-02-13 NOTE — Progress Notes (Signed)
Pre HD RN assessment 

## 2022-02-13 NOTE — Progress Notes (Addendum)
[facility] Progress Note  CC:  Anemia, coffee ground emesis   Subjective: He had an episode of hematemesis yesterday around 11 PM and his nasal packing fell out without any further epistaxis.  He had an episode of coffee-ground emesis at 4 AM and a scant amount around 12:30 PM after which it was reported by his RN.  He had a small nonbloody bowel movement earlier today after receiving an enema.  No abdominal pain.  No chest pain or shortness of breath.  His son is at the bedside.   Objective:  Vital signs in last 24 hours: Temp:  [96.6 F (35.9 C)-98.8 F (37.1 C)] 98.8 F (37.1 C) (08/05 0700) Pulse Rate:  [78-91] 84 (08/05 1100) Resp:  [8-28] 19 (08/05 1100) BP: (111-158)/(51-74) 143/51 (08/05 1100) SpO2:  [83 %-100 %] 100 % (08/05 1100) FiO2 (%):  [35 %] 35 % (08/05 0800) Weight:  [53.4 kg] 53.4 kg (08/05 0400) Last BM Date : 02/12/22  General: Cachectic ill-appearing male. Eyes: Scleral icterus. Heart: Regular rate and rhythm, systolic murmur. Pulm: Breath sounds clear, decreased in the bases. Abdomen: Soft, mildly distended.  Nontender.  Positive bowel sounds all 4 quadrants Extremities: Bilateral lower extremities with trace edema. Neurologic:  Alert and  oriented x 4. Grossly normal neurologically. Psych:  Alert and cooperative. Normal mood and affect. Skin: + Jaundice.  Intake/Output from previous day: 08/04 0701 - 08/05 0700 In: 720.1 [I.V.:5.1; Blood:615; IV Piggyback:100] Out: 205 [Urine:105; Emesis/NG output:100] Intake/Output this shift: No intake/output data recorded.  Lab Results: Recent Labs    02/12/22 0607 02/12/22 1541 02/18/2022 0349  WBC 9.8 10.9* 13.0*  HGB 6.4* 7.5* 7.2*  HCT 18.6* 22.6* 21.3*  PLT 39* 44* 80*   BMET Recent Labs    02/11/22 0845 02/11/22 1351 02/12/22 0054 02/12/22 0607 02/22/2022 0349  NA 137  --   --  133* 133*  K 5.3*  5.3*   < > 3.4* 4.1 4.8  CL 98  --   --  93* 93*  CO2 20*  --   --  26 24   GLUCOSE 76  --   --  121* 120*  BUN 85*  --   --  55* 91*  CREATININE 4.02*  --   --  2.62* 3.70*  CALCIUM 8.3*  --   --  8.1* 7.8*   < > = values in this interval not displayed.   LFT Recent Labs    02/12/22 0607 02/27/2022 0349  PROT 5.5* 5.0*  ALBUMIN 2.5*  2.5* 2.5*  AST 3,070* 3,065*  ALT 2,150* 1,045*  ALKPHOS 1,318* 1,049*  BILITOT 3.2* 4.1*  BILIDIR 2.0*  --   IBILI 1.2*  --    PT/INR Recent Labs    02/12/22 0607 03/03/2022 0349  LABPROT 31.8* 25.2*  INR 3.1* 2.3*   Hepatitis Panel Recent Labs    02/16/2022 2130 03/03/2022 2305  HEPBSAG  --  NON REACTIVE  HCVAB NON REACTIVE  --     US LIVER DOPPLER  Result Date: 02/21/2022 CLINICAL DATA:  76 year old male with history of liver failure. EXAM: DUPLEX ULTRASOUND OF LIVER TECHNIQUE: Color and duplex Doppler ultrasound was performed to evaluate the hepatic in-flow and out-flow vessels. COMPARISON:  CT abdomen pelvis from 02/11/2022 FINDINGS: Liver: Normal parenchymal echogenicity. Normal hepatic contour without nodularity. No focal lesion, mass or intrahepatic biliary ductal dilatation. Main Portal Vein size: 1.5 cm Portal Vein Velocities Main Prox:  41 cm/sec Main Mid: 27 cm/sec Main  Dist:  38 cm/sec Right: 63 cm/sec Left: 21 cm/sec All visualized portal venous flow is antegrade/hepatopetal. Hepatic Vein Velocities Right:  25 cm/sec Middle:  41 cm/sec Left:  38 cm/sec IVC: Present and patent with normal respiratory phasicity. Hepatic Artery Velocity:  241 cm/sec Splenic Vein Velocity:  43 cm/sec Spleen: 3.8 cm x 7.6 cm x 2.8 cm with a total volume of 42 cm^3 (411 cm^3 is upper limit normal) Portal Vein Occlusion/Thrombus: No Splenic Vein Occlusion/Thrombus: No Ascites: Trace Varices: None Right greater than left bilateral pleural effusions are noted. IMPRESSION: 1. Patent hepatic vasculature with normal direction of flow. 2. Normal sonographic appearance of the liver. 3. Trace ascites. 4. Right greater than left bilateral pleural  effusions. Ruthann Cancer, MD [facility] Radiology Electronically Signed   By: Ruthann Cancer M.D.   On: 03/08/2022 11:01    Assessment / Plan:  76 year old male with a history of GERD, Barrett's esophagus, small bowel AVMs with acute on chronic IDA and coffee-ground emesis. Admission Hg 6.1 -> transfused 1 unit of PRBCs -> Hg 7.1 -> Hg 6.4 on 8/4 transfused 1 unit of PRBCs -> Hg 7.4 -> Hg today 7.2. He had an episode of hematemesis at 11 PM last night and 4am then another episode (scant amount) around 12:30pm today. -Enteroscopy scheduled today was canceled per Dr. Lyndel Safe due to coagulopathy, thrombocytopenia and suspected acute liver failure -Continue to hold Eliquis -CBC in a.m. -Transfuse for hemoglobin less than 8 -Continue PPI IV twice daily -Await further recommendations per Dr. Lyndel Safe    Epistaxis. Evaluated by ENT, right sided epistaxis secondary to superficial ulceration, packing placed.  Patient had vomiting episode yesterday evening and nasal packing came out without recurrent epistaxis.   Acute Transaminitis. T. Bili 2.6 -> 3.2 -> 4.2. Alk phos  1,645 -> 1,318 -> 1,049.  AST 9,636 -> 3,070 -> 3,065. ALT 3,686 ->2,150 ->1,045.  Query shock liver. No documented episodes of significant hypotension but patient was bradycardic at time of admission. Afebrile. CTAP w/o contrast 8/3 showed a normal liver without evidence of biliary ductal dilatation. No splenomegaly. Liver doppler was negative.  -Hepatic panel and INR in a.m. -Consider stopping Amiodarone   CKD V -> ESRD.  Received hemodialysis 8/3 and 8/4.  Eventual tunneled dialysis catheter placement.    Coagulopathy secondary to liver failure. INR 3.1 -> 2.3.  Thrombocytopenia. PLT 44 -> transfused 1 unit of platelets 8/4 -> today PLT 80.   Atrial fibrillation.  Last dose of Eliquis was 8/3 at 9:30 AM.   Chronic constipation -MiraLAX nightly -Dulcolax suppository as needed   History  of tubular adenomatous colon polyps per colonoscopy 11/2019 -Next colon polyp surveillance colonoscopy due 11/2022   Left paraganglioma with associated weight loss followed by oncology at [place] Problem:   Renal failure Active Problems:   Chronic diastolic heart failure ([place])     LOS: 2 days   Noralyn Pick  02/09/2022, 4:52PM   Attending physician's note   I have taken history, reviewed the chart and examined the patient. I performed a substantive portion of this encounter, including complete performance of at least one of the key components, in conjunction with the APP. I agree with the Advanced Practitioner's note, impression and recommendations.   CGE likely d/t epistaxis.  Abn LFTs s/o acute liver failure with worsening jaundice, coagulopathy. Ischemic vs DILI vs Autoimmune. Neg [place] with doppler. No infiltrative process. No Hepatic encephalopathy.  Has thrombocytopenia. Chronic  constipation End-stage renal disease on HD Large left retroperitoneal paraganglioma.  Plan: -Stop amiodarone and Lipitor -Would recommend supportive treatment only -Not a candidate for liver bx. -Trend Lfts, CBC, INR. -Hold off on any endoscopic procedures.  May be more detrimental given above comorbidities. -MiraLAX/Dulcolax/senna for constipation.  If no response, would consider GoLytely. -Discussed with patient's son in detail who agrees with the above plan.  He does understand poor prognosis.   Carmell Austria, MD [facility] [phone]

## 2022-02-13 NOTE — Assessment & Plan Note (Signed)
Continue blood pressure monitoring, continue to hold on antihypertensive agents.

## 2022-02-13 NOTE — Assessment & Plan Note (Addendum)
Junctional bradycardia.   Continue diltiazem for rate control, considering liver failure amiodarone has been discontinued.  Continue telemetry monitoring.

## 2022-02-14 DIAGNOSIS — I10 Essential (primary) hypertension: Secondary | ICD-10-CM | POA: Diagnosis not present

## 2022-02-14 DIAGNOSIS — K7200 Acute and subacute hepatic failure without coma: Secondary | ICD-10-CM | POA: Diagnosis not present

## 2022-02-14 DIAGNOSIS — A4150 Gram-negative sepsis, unspecified: Secondary | ICD-10-CM

## 2022-02-14 DIAGNOSIS — N186 End stage renal disease: Secondary | ICD-10-CM | POA: Diagnosis not present

## 2022-02-14 DIAGNOSIS — Z992 Dependence on renal dialysis: Secondary | ICD-10-CM | POA: Diagnosis not present

## 2022-02-14 DIAGNOSIS — I5032 Chronic diastolic (congestive) heart failure: Secondary | ICD-10-CM | POA: Diagnosis not present

## 2022-02-14 DIAGNOSIS — N390 Urinary tract infection, site not specified: Secondary | ICD-10-CM

## 2022-02-14 LAB — COMPREHENSIVE METABOLIC PANEL
ALT: 667 U/L — ABNORMAL HIGH (ref 0–44)
AST: 1180 U/L — ABNORMAL HIGH (ref 15–41)
Albumin: 2.4 g/dL — ABNORMAL LOW (ref 3.5–5.0)
Alkaline Phosphatase: 1000 U/L — ABNORMAL HIGH (ref 38–126)
Anion gap: 15 (ref 5–15)
BUN: 51 mg/dL — ABNORMAL HIGH (ref 8–23)
CO2: 24 mmol/L (ref 22–32)
Calcium: 7.5 mg/dL — ABNORMAL LOW (ref 8.9–10.3)
Chloride: 93 mmol/L — ABNORMAL LOW (ref 98–111)
Creatinine, Ser: 2.55 mg/dL — ABNORMAL HIGH (ref 0.61–1.24)
GFR, Estimated: 26 mL/min — ABNORMAL LOW (ref >60)
Glucose, Bld: 116 mg/dL — ABNORMAL HIGH (ref 70–99)
Potassium: 4.3 mmol/L (ref 3.5–5.1)
Sodium: 132 mmol/L — ABNORMAL LOW (ref 135–145)
Total Bilirubin: 5.3 mg/dL — ABNORMAL HIGH (ref 0.3–1.2)
Total Protein: 5 g/dL — ABNORMAL LOW (ref 6.5–8.1)

## 2022-02-14 LAB — CBC
HCT: 21.5 % — ABNORMAL LOW (ref 39.0–52.0)
Hemoglobin: 7.1 g/dL — ABNORMAL LOW (ref 13.0–17.0)
MCH: 29.3 pg (ref 26.0–34.0)
MCHC: 33 g/dL (ref 30.0–36.0)
MCV: 88.8 fL (ref 80.0–100.0)
Platelets: 109 10*3/uL — ABNORMAL LOW (ref 150–400)
RBC: 2.42 MIL/uL — ABNORMAL LOW (ref 4.22–5.81)
RDW: 16.2 % — ABNORMAL HIGH (ref 11.5–15.5)
WBC: 15.9 10*3/uL — ABNORMAL HIGH (ref 4.0–10.5)
nRBC: 2.3 % — ABNORMAL HIGH (ref 0.0–0.2)

## 2022-02-14 LAB — CMV IGM: CMV IgM: <30 AU/mL (ref 0.0–29.9)

## 2022-02-14 LAB — GLUCOSE, CAPILLARY
Glucose-Capillary: 115 mg/dL — ABNORMAL HIGH (ref 70–99)
Glucose-Capillary: 107 mg/dL — ABNORMAL HIGH (ref 70–99)
Glucose-Capillary: 131 mg/dL — ABNORMAL HIGH (ref 70–99)
Glucose-Capillary: 144 mg/dL — ABNORMAL HIGH (ref 70–99)
Glucose-Capillary: 166 mg/dL — ABNORMAL HIGH (ref 70–99)

## 2022-02-14 LAB — IGG: IgG (Immunoglobin G), Serum: 861 mg/dL (ref 603–1613)

## 2022-02-14 LAB — BILIRUBIN, DIRECT: Bilirubin, Direct: 3.1 mg/dL — ABNORMAL HIGH (ref 0.0–0.2)

## 2022-02-14 LAB — PROTIME-INR
INR: 1.7 — ABNORMAL HIGH (ref 0.8–1.2)
Prothrombin Time: 19.4 seconds — ABNORMAL HIGH (ref 11.4–15.2)

## 2022-02-14 LAB — EPSTEIN-BARR VIRUS VCA, IGM: EBV VCA IgM: <36 U/mL (ref 0.0–35.9)

## 2022-02-14 LAB — ANA: Anti Nuclear Antibody (ANA): NEGATIVE

## 2022-02-14 LAB — ANTI-SMOOTH MUSCLE ANTIBODY, IGG: F-Actin IgG: 5 Units (ref 0–19)

## 2022-02-14 MED ORDER — CHLORHEXIDINE GLUCONATE CLOTH 2 % EX PADS
6.0000 | MEDICATED_PAD | Freq: Every day | CUTANEOUS | Status: DC
  Administered 2022-02-15: 6 via TOPICAL

## 2022-02-14 MED ORDER — SODIUM CHLORIDE 0.9 % IV SOLN: INTRAVENOUS | Status: DC

## 2022-02-14 NOTE — Progress Notes (Signed)
Progress Note   Patient: Adam Reid KNL:[id number] DOB: 1945/11/09 DOA: 02/20/2022     3 DOS: the patient was seen and examined on 02/14/2022   Brief hospital course: Mr. Hineman was admitted to the hospital with the working diagnosis of worsening renal failure, and volume overload.  Now on hemodialysis.   76 yo male with past medical history of CKD stage V, heart failure, T2DM, paroxysmal atrial fibrillation and paraganglioma who presented with weakness. Reported several days of worsening weakness, fatigue and lower extremity edema. At home his HR was in the 30's and he was transported to the ED. On his initial physical examination his blood pressure was 114/48, HR 34, RR 22 and 02 saturation 97% on supplemental 02, lungs with no wheezing, heart with S1 and S2 present and bradycardic, abdomen not distended and positive lower extremity edema.   Na 140, K 6,9 CL 100 bicarbonate 18 glucose 63 bun 122 cr 5,57  ALK p 1,966 AST 1,537 ALT 743  Total Bil 1,2  Wbc 5.1 hgb 6,8 plt 170 TSH 5,515  Urine SG 1,010, 100 protein   CT abdomen and pelvis with no acute changes. Loculated pleural effusions. Positive retroperitoneal mass. No focal liver abnormalities.   Chest radiograph with right loculated pleural effusion.   EKG 40 bpm, normal axis, right bundle branch block, qtc 505, junctional rhythm with no significant ST segment or T wave changes.   08/03 CVC for HD insertion  08/03 emergent HD   Patient with worsening liver failure.   08/04/ and 08/05 consecutive hemodialysis.   Assessment and Plan: * ESRD on dialysis ([place]) Hyperkalemia and hyponatremia.   Patient with progressive renal failure. SP initiation of renal replacement therapy, had 3 consecutive days with good toleration.   Today he continue to have edema in his lower extremities Na 132, K 4,3 and serum bicarbonate at 24.  Blood pressure systolic is 790 to 240 mmHg.   Plan to continue HD tomorrow. Follow nephrology  recommendations, possible schedule M-W-F.  Will need a tunneled HD catheter for routine hemodialysis.   Chronic diastolic heart failure (HCC) Patient with volume overload and renal failure Echocardiogram with preserved LV systolic function 55 to 97%, RV systolic function is preserved. RVSP 55.4. Moderate TR   Systolic blood pressure 353 to 140 mmHg.   Plan to continue ultrafiltration as tolerated.  Close blood pressure monitoring.    Hypertension Continue blood pressure monitoring, continue to hold on antihypertensive agents.   PAF (paroxysmal atrial fibrillation) (HCC) Junctional bradycardia.   Continue diltiazem for rate control, considering liver failure amiodarone has been discontinued.  Continue telemetry monitoring.   Liver failure (HCC) Persistent elevation in liver enzymes,  AST 1.180, ALT 667  Worsening T Bil at 5.3 Plt 109 INR 1.7  Patient with liver failure with high risk of worsening, Poor prognosis, not candidate for aggressive therapy due to multiorgan failure.  Case discussed with Dr [name] from GI  Continue close follow up on liver function test.   Paraganglioma [facility]) Poor prognosis Consultation in [facility] hematology, patient not a surgical or medical candidate due to extensive comorbid conditions.   Protein-calorie malnutrition, severe ([place]) Continue with nutrition supplements as tolerated.   Sepsis due to gram-negative UTI (HCC) Wbc is 15,9  Urine culture positive for pseudomonas.  Antibiotic therapy has been changed to cefepime.  Continue to follow up cell count in am.         Subjective: Patient with no pain or dyspnea, continue very weak and deconditioned,  Physical Exam: Vitals:   02/14/22 1100 02/14/22 1200 02/14/22 1300 02/14/22 1428  BP: (!) 137/53 (!) 144/44 (!) 133/48 (!) 119/48  Pulse: 87 86 82 81  Resp: 17 (!) 27 (!) 22 (!) 22  Temp:      TempSrc:      SpO2: 96% 99% 98% 97%  Weight:      Height:       Neurology awake  and alert ENT with pallor and icterus Cardiovascular with S1 and S2 present with positive systolic murmur at the apex No gallops or murmurs Respiratory with scattered rales Abdomen not distended Positive lower extremity edema ++ pitting  Data Reviewed:    Family Communication: no family at the bedside   Disposition: Status is: Inpatient Remains inpatient appropriate because: renal failure   Planned Discharge Destination: Home      Author: Tawni Millers, MD 02/14/2022 4:01 PM  For on call review [email or web address].

## 2022-02-14 NOTE — Progress Notes (Signed)
[facility] Progress Note   51M progressive stage V CKD followed by Dr. 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 Found to be in junctional bradycardia with a heart rate in the 30s, potassium 6.6, QRS prolonged at 150. Tx medically.  Had been hesitant to initiate dialysis but has been willing to receive it when it came to a life or death situation.   Assessment/ Plan:   51M with advanced CKD 5 progressed senting with junctional bradycardia, progressive dyspnea, worsened renal failure, severe anemia. 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 S/p RUE AVG placement 02/08/22 [facility] [place]. Looks like a very high stick with the RIJ temp; should get a lower stick for new TC. - S/P HD #1 very early 8/3, 2nd treatment very early 8/4. K stable now after 2 treatments.  #3 Sat with 1L UF. TC conversion Monday and then HD given he has a MWF outpt spot now.  - Thanks to renal navigator Olivia Mackie -> SW MWF 1215 chair time, 1130 1st day to fill out paperwork.  Junctional bradycardia secondary to hyperkalemia while on amiodarone and diltiazem -> resolved with dialysis. Anemia, severe, on ESA therapy as outpatient. ESA but no iron given high ferritin. 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  Phoslo 1 TIDM. HFpEF - compensated DM2 - per primary Hypertension, blood pressure stable Retroperitoneal paraganglioma - had appt @ [facility] for 2nd opinion, surgeon @ [facility] felt it was very high risk and recommended a tertiary center.    Subjective:   Denies  f/c/n/v/dyspnea/ CP. But still poor appetite. Epistaxis Fri  tx by ENT and currently appears to be stable. No further e/o active bleed.   Objective:   BP (!) 150/53   Pulse 86   Temp 98.6 F (37 C) (Oral)   Resp 19   Ht '5\' 6"'$  (1.676 m)   Wt 53.4 kg   SpO2 98%   BMI 19.00 kg/m   Intake/Output Summary (Last 24 hours) at 02/14/2022 0835 Last data filed at 02/14/2022 0600 Gross per 24 hour  Intake 100 ml  Output 1060 ml  Net -960 ml   Weight change: 0 kg  Physical Exam: GEN: Unwell appearing,  thin/cachectic ENT: NCAT EYES: EOMI CV: regular rhythm PULM: Appears to be comfortable ABD: Soft, nontender SKIN: No rashes or lesions EXT: no lower extremity edema ACCESS: RUA AVG + thrill, high RIJ  temp    Imaging: US LIVER DOPPLER  Result Date: 03/06/2022 CLINICAL DATA:  76 year old male with history of liver failure. EXAM: DUPLEX ULTRASOUND OF LIVER TECHNIQUE: Color and duplex Doppler ultrasound was performed to evaluate the hepatic in-flow and out-flow vessels. COMPARISON:  CT abdomen pelvis from 02/11/2022 FINDINGS: Liver: Normal parenchymal echogenicity. Normal hepatic contour without nodularity. No focal lesion, mass or intrahepatic biliary ductal dilatation. Main Portal Vein size: 1.5 cm Portal Vein Velocities Main Prox:  41 cm/sec Main Mid: 27 cm/sec Main Dist:  38 cm/sec Right: 63 cm/sec Left: 21 cm/sec All visualized portal venous flow is antegrade/hepatopetal. Hepatic Vein Velocities Right:  25 cm/sec Middle:  41 cm/sec Left:  38  cm/sec IVC: Present and patent with normal respiratory phasicity. Hepatic Artery Velocity:  241 cm/sec Splenic Vein Velocity:  43 cm/sec Spleen: 3.8 cm x 7.6 cm x 2.8 cm with a total volume of 42 cm^3 (411 cm^3 is upper limit normal) Portal Vein Occlusion/Thrombus: No Splenic Vein Occlusion/Thrombus: No Ascites: Trace Varices: None Right greater than left bilateral pleural effusions are noted. IMPRESSION: 1. Patent hepatic vasculature with normal direction of  flow. 2. Normal sonographic appearance of the liver. 3. Trace ascites. 4. Right greater than left bilateral pleural effusions. Ruthann Cancer, MD [facility] Radiology Electronically Signed   By: Ruthann Cancer M.D.   On: 02/25/2022 11:01    Labs: BMET Recent Labs  Lab 02/19/2022 2130 03/07/2022 2140 02/11/22 0000 02/11/22 0250 02/11/22 0845 02/11/22 1351 02/11/22 1836 02/12/22 0054 02/12/22 0607 02/11/2022 0349 02/14/22 0341  NA 140 135  135 127*  --  137  --   --   --  133* 133* 132*  K 6.9* 6.6*  6.6* 5.8* 6.7* 5.3*  5.3* 5.7* 5.7* 3.4* 4.1 4.8 4.3  CL 100 104 91*  --  98  --   --   --  93* 93* 93*  CO2 18*  --  14*  --  20*  --   --   --  '26 24 24  '$ GLUCOSE 63* 59* 792* 97 76  --   --   --  121* 120* 116*  BUN 122* >130* 118*  --  85*  --   --   --  55* 91* 51*  CREATININE 5.57* 6.00* 5.32*  --  4.02*  --   --   --  2.62* 3.70* 2.55*  CALCIUM 8.8*  --  8.5*  --  8.3*  --   --   --  8.1* 7.8* 7.5*  PHOS  --   --   --   --   --   --   --   --  6.1*  --   --    CBC Recent Labs  Lab 02/25/2022 2130 02/21/2022 2140 02/12/22 0607 02/12/22 1541 02/20/2022 0349 02/14/22 0630  WBC 5.1   < > 9.8 10.9* 13.0* 15.9*  NEUTROABS 4.4  --   --   --   --   --   HGB 6.8*   < > 6.4* 7.5* 7.2* 7.1*  HCT 21.5*   < > 18.6* 22.6* 21.3* 21.5*  MCV 92.3   < > 87.3 88.3 88.0 88.8  PLT 170   < > 39* 44* 80* 109*   < > = values in this interval not displayed.    Medications:     sodium chloride   Intravenous Once   sodium chloride   Intravenous Once   sodium chloride   Intravenous Once   Chlorhexidine Gluconate Cloth  6 each Topical Q0600   Chlorhexidine Gluconate Cloth  6 each Topical Q0600   darbepoetin (ARANESP) injection - DIALYSIS  200 mcg Intravenous Q Sat-HD   diltiazem  180 mg Oral Daily   insulin aspart  0-6 Units Subcutaneous Q4H   multivitamin  1 tablet Oral QHS   oxymetazoline  1 spray Each Nare BID   pantoprazole (PROTONIX) IV  40 mg  Intravenous Q12H   polyethylene glycol  17 g Oral BID   senna  2 tablet Oral Daily   sodium chloride flush  10-40 mL Intracatheter Q12H      Otelia Santee, MD 02/14/2022, 8:35 AM

## 2022-02-14 NOTE — Progress Notes (Addendum)
[facility] Progress Note  CC:  Anemia, coffee ground emesis   Subjective: No further hematemesis or epistaxis. He is taking small quantities of a renal diet. No BM today.   Objective:  Vital signs in last 24 hours: Temp:  [97.4 F (36.3 C)-98.6 F (37 C)] 98.6 F (37 C) (08/06 0700) Pulse Rate:  [81-108] 81 (08/06 1428) Resp:  [9-27] 22 (08/06 1428) BP: (100-154)/(44-84) 119/48 (08/06 1428) SpO2:  [95 %-100 %] 97 % (08/06 1428) Weight:  [53.4 kg] 53.4 kg (08/05 1558) Last BM Date : 02/12/22  General: Cachectic ill-appearing male.  Heart: Regular rate and rhythm, systolic murmur. Pulm:Breath sounds clear, decreased in the bases. Abdomen: Soft, mildly distended.  Nontender.  Positive bowel sounds all 4 quadrants. Extremities:  Bilateral lower extremities with trace edema/ Neurologic:  Alert and  oriented, moves all extremities weakly.  Psych:  Alert and cooperative. Normal mood and affect.  Intake/Output from previous day: 08/05 0701 - 08/06 0700 In: 100 [IV Piggyback:100] Out: 1060 [Urine:30; Emesis/NG output:30] Intake/Output this shift: Total I/O In: 240 [P.O.:240] Out: -   Lab Results: Recent Labs    02/12/22 1541 02/12/2022 0349 02/14/22 0630  WBC 10.9* 13.0* 15.9*  HGB 7.5* 7.2* 7.1*  HCT 22.6* 21.3* 21.5*  PLT 44* 80* 109*   BMET Recent Labs    02/12/22 0607 03/06/2022 0349 02/14/22 0341  NA 133* 133* 132*  K 4.1 4.8 4.3  CL 93* 93* 93*  CO2 _0 GLUCOSE 121* 120* 116*  BUN 55* 91* 51*  CREATININE 2.62* 3.70* 2.55*  CALCIUM 8.1* 7.8* 7.5*   LFT Recent Labs    02/12/22 0607 02/28/2022 0349 02/14/22 0341  PROT 5.5*   < > 5.0*  ALBUMIN 2.5*  2.5*   < > 2.4*  AST 3,070*   < > 1,180*  ALT 2,150*   < > 667*  ALKPHOS 1,318*   < > 1,000*  BILITOT 3.2*   < > 5.3*  BILIDIR 2.0*  --  3.1*  IBILI 1.2*  --   --    < > = values in this interval not displayed.   PT/INR Recent Labs    03/07/2022 0349 02/14/22 0341  LABPROT  25.2* 19.4*  INR 2.3* 1.7*   Hepatitis Panel Recent Labs    02/25/2022 0349  HEPAIGM NON REACTIVE  HEPBIGM NON REACTIVE    US LIVER DOPPLER  Result Date: 02/11/2022 CLINICAL DATA:  76 year old male with history of liver failure. EXAM: DUPLEX ULTRASOUND OF LIVER TECHNIQUE: Color and duplex Doppler ultrasound was performed to evaluate the hepatic in-flow and out-flow vessels. COMPARISON:  CT abdomen pelvis from 02/11/2022 FINDINGS: Liver: Normal parenchymal echogenicity. Normal hepatic contour without nodularity. No focal lesion, mass or intrahepatic biliary ductal dilatation. Main Portal Vein size: 1.5 cm Portal Vein Velocities Main Prox:  41 cm/sec Main Mid: 27 cm/sec Main Dist:  38 cm/sec Right: 63 cm/sec Left: 21 cm/sec All visualized portal venous flow is antegrade/hepatopetal. Hepatic Vein Velocities Right:  25 cm/sec Middle:  41 cm/sec Left:  38 cm/sec IVC: Present and patent with normal respiratory phasicity. Hepatic Artery Velocity:  241 cm/sec Splenic Vein Velocity:  43 cm/sec Spleen: 3.8 cm x 7.6 cm x 2.8 cm with a total volume of 42 cm^3 (411 cm^3 is upper limit normal) Portal Vein Occlusion/Thrombus: No Splenic Vein Occlusion/Thrombus: No Ascites: Trace Varices: None Right greater than left bilateral pleural effusions are noted. IMPRESSION: 1. Patent hepatic vasculature with normal direction of flow.  2. Normal sonographic appearance of the liver. 3. Trace ascites. 4. Right greater than left bilateral pleural effusions. Ruthann Cancer, MD [facility] Electronically Signed   By: Ruthann Cancer M.D.   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 Admission Hg 6.1 -> transfused 1 unit of PRBCs -> Hg 7.1 -> Hg 6.4 on 8/4 transfused 1 unit of PRBCs -> Hg 7.4 -> Hg 7.2. -> today Hg 7.1 No further hematemesis  today. Enteroscopy was canceled 8/5 due to worsening coagulopathy, thrombocytopenia and suspected acute liver failure. -CBC in am -Continue PPI IV bid -Continue to monitor patient closely for active GI bleeding -Continue to hold Eliquis -Further recommendations per Dr. Lyndel Safe   2) Epistaxis. Evaluated by ENT, right sided epistaxis secondary to superficial ulceration, packing placed.  Patient had vomiting episode 8/5 and nasal packing came out without recurrent epistaxis.   3) Acute Transaminitis. T. Bili 4.1 -> 5.3. Alk phos 1,049 -> 1,000. AST 3,065 -> 1,180. ALT 1,045 -> 667. Query shock liver. No documented episodes of significant hypotension but patient was bradycardic at time of admission. Afebrile. CTAP w/o contrast 8/3 showed a normal liver without evidence of biliary ductal dilatation. No splenomegaly. Liver doppler was negative. Amiodarone dc'd 8/5.  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 Eventual tunneled dialysis catheter placement.    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 INR 3.1 -> 2.3 -> 1.7.   6) Thrombocytopenia. PLT 44 -> transfused 1 unit of platelets 8/4 -> today 109.   7) Atrial fibrillation.  Last dose of Eliquis was 8/3 at 9:30 AM.   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 I performed a substantive portion of this encounter, including complete performance of at least one of the key components, in conjunction with the APP. I agree with the Advanced Practitioner's note, impression and recommendations.   No further bleeding. LFTs are trending down.  However,TB is increasing. INR trending down. No S/S encephalopathy.  He was playing card games on his iPad. Still no Bms  Plan: -Renal diet -Trend labs -Ambulate -Continue current laxatives. If no BM until tomorrow, would consider GoLytely (if no N/V) -Not a candidate for transjugular liver biopsy. -D/W son  Dr. Lorenso Courier taking over the service tomorrow   Carmell Austria, MD [facility] [phone]

## 2022-02-14 NOTE — Assessment & Plan Note (Signed)
Wbc is 15,9  Urine culture positive for pseudomonas.  Antibiotic therapy has been changed to cefepime.  Continue to follow up cell count in am.

## 2022-02-14 NOTE — Evaluation (Signed)
Physical Therapy Evaluation Patient Details Name: Adam Reid MRN: [id number] DOB: [birth year] Today's Date: 02/14/2022  History of Present Illness  The pt is a 76 yo male presenting 8/2 with generalized weakness and bradycardia. 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 PMH includes: CKD V with RUE AV graft placement 7/31, Left retroperitoneal paraganglioma, HFpEF, DM II, and HTN.   Clinical Impression  Pt in bed upon arrival of PT, agreeable to evaluation at this time. Prior to admission the pt was ambulating with use of SPC in the home, independent with mobility but needed assist from his spouse with bathing. The pt now presents with limitations in functional mobility, strength, stability, power, and endurance due to above dx, and will continue to benefit from skilled PT to address these deficits. The pt required minA to complete OOB mobility and up to modA to correct LOB when turning in the room. The pt is hopeful to progress mobility to point where he can return home with his wife and HHPT, but will need continued acute rehab to progress towards these goals.         Recommendations for follow up therapy are one component of a multi-disciplinary discharge planning process, led by the attending physician.  Recommendations may be updated based on patient status, additional functional criteria and insurance authorization.  Follow Up Recommendations Home health PT      Assistance Recommended at Discharge Frequent or constant Supervision/Assistance  Patient can return home with the following  A little help with walking and/or transfers;A little help with bathing/dressing/bathroom;Assistance with cooking/housework;Direct supervision/assist for medications management;Assist for transportation;Direct supervision/assist for financial management;Help with stairs or ramp for entrance    Equipment Recommendations None recommended by PT  Recommendations for Other Services  OT consult     Functional Status Assessment Patient has had a recent decline in their functional status and demonstrates the ability to make significant improvements in function in a reasonable and predictable amount of time.     Precautions / Restrictions Precautions Precautions: Fall Precaution Comments: watch O2 Restrictions Weight Bearing Restrictions: No      Mobility  Bed Mobility Overal bed mobility: Needs Assistance Bed Mobility: Supine to Sit     Supine to sit: Min assist     General bed mobility comments: minA to complete and manage lines, increased time and effort    Transfers Overall transfer level: Needs assistance Equipment used: 1 person hand held assist Transfers: Sit to/from Stand Sit to Stand: Min assist           General transfer comment: minA to power up and steady in standing.    Ambulation/Gait Ambulation/Gait assistance: Min assist Gait Distance (Feet): 30 Feet (10 ft forwards and back in room) Assistive device: 1 person hand held assist Gait Pattern/deviations: Step-through pattern, Decreased stride length, Shuffle, Narrow base of support Gait velocity: decreased Gait velocity interpretation: <1.31 ft/sec, indicative of household ambulator   General Gait Details: multiple LOB when turning to L, min-modA to steady     Balance Overall balance assessment: Needs assistance Sitting-balance support: No upper extremity supported, Feet supported Sitting balance-Leahy Scale: Fair     Standing balance support: During functional activity, Single extremity supported Standing balance-Leahy Scale: Poor Standing balance comment: LOB with turning needing min-modA                             Pertinent Vitals/Pain Pain Assessment Pain Assessment: No/denies pain  Home Living Family/patient expects to be discharged to:: Private residence Living Arrangements: Spouse/significant other Available Help at Discharge: Family;Available 24 hours/day Type  of Home: House Home Access: Level entry       Home Layout: One level Home Equipment: Conservation officer, nature (2 wheels);Cane - single point;BSC/3in1;Shower seat;Wheelchair - manual      Prior Function Prior Level of Function : Needs assist       Physical Assist : ADLs (physical)   ADLs (physical): Bathing Mobility Comments: pt reports use of cane at home ADLs Comments: pt reports wife assists with bathing, uses shower seat. wife completes IADLs     Hand Dominance   Dominant Hand: Right    Extremity/Trunk Assessment   Upper Extremity Assessment Upper Extremity Assessment: Generalized weakness    Lower Extremity Assessment Lower Extremity Assessment: Generalized weakness    Cervical / Trunk Assessment Cervical / Trunk Assessment: Kyphotic  Communication   Communication: Prefers language other than English Licensed conveyancer used. #[id number])  Cognition Arousal/Alertness: Awake/alert Behavior During Therapy: WFL for tasks assessed/performed Overall Cognitive Status: Difficult to assess                                 General Comments: pt at times impuslive, drinking out of sink rather than asking for cup from tray. good awareness of lines        General Comments General comments (skin integrity, edema, etc.): VSS on 7L O2    Exercises     Assessment/Plan    PT Assessment Patient needs continued PT services  PT Problem List Decreased strength;Decreased activity tolerance;Decreased balance;Decreased mobility;Decreased coordination;Decreased safety awareness       PT Treatment Interventions DME instruction;Gait training;Stair training;Functional mobility training;Therapeutic activities;Therapeutic exercise;Balance training;Patient/family education    PT Goals (Current goals can be found in the Care Plan section)  Acute Rehab PT Goals Patient Stated Goal: return home for rehab PT Goal Formulation: With patient Time For Goal Achievement: 02/28/22 Potential  to Achieve Goals: Good    Frequency Min 3X/week        AM-PAC PT "6 Clicks" Mobility  Outcome Measure Help needed turning from your back to your side while in a flat bed without using bedrails?: A Little Help needed moving from lying on your back to sitting on the side of a flat bed without using bedrails?: A Little Help needed moving to and from a bed to a chair (including a wheelchair)?: A Little Help needed standing up from a chair using your arms (e.g., wheelchair or bedside chair)?: A Little Help needed to walk in hospital room?: A Lot Help needed climbing 3-5 steps with a railing? : A Lot 6 Click Score: 16    End of Session Equipment Utilized During Treatment: Gait belt;Oxygen Activity Tolerance: Patient tolerated treatment well Patient left: in chair;with call bell/phone within reach;with family/visitor present Nurse Communication: Mobility status PT Visit Diagnosis: Other abnormalities of gait and mobility (R26.89);Muscle weakness (generalized) (M62.81)    Time: 3570-1779 PT Time Calculation (min) (ACUTE ONLY): 30 min   Charges:   PT Evaluation $PT Eval Moderate Complexity: 1 Mod PT Treatments $Therapeutic Activity: 8-22 mins        [place] Carbo, PT, DPT   Acute Rehabilitation Department  Sandra Cockayne 02/14/2022, 5:01 PM

## 2022-02-15 ENCOUNTER — Inpatient Hospital Stay (HOSPITAL_COMMUNITY): Payer: Medicare Other

## 2022-02-15 DIAGNOSIS — N186 End stage renal disease: Secondary | ICD-10-CM | POA: Diagnosis not present

## 2022-02-15 DIAGNOSIS — I5032 Chronic diastolic (congestive) heart failure: Secondary | ICD-10-CM | POA: Diagnosis not present

## 2022-02-15 DIAGNOSIS — I10 Essential (primary) hypertension: Secondary | ICD-10-CM | POA: Diagnosis not present

## 2022-02-15 DIAGNOSIS — I480 Paroxysmal atrial fibrillation: Secondary | ICD-10-CM

## 2022-02-15 DIAGNOSIS — K7200 Acute and subacute hepatic failure without coma: Secondary | ICD-10-CM | POA: Diagnosis not present

## 2022-02-15 HISTORY — PX: IR FLUORO GUIDE CV LINE RIGHT: IMG2283

## 2022-02-15 HISTORY — PX: IR US GUIDE VASC ACCESS RIGHT: IMG2390

## 2022-02-15 LAB — COMPREHENSIVE METABOLIC PANEL
ALT: 469 U/L — ABNORMAL HIGH (ref 0–44)
AST: 573 U/L — ABNORMAL HIGH (ref 15–41)
Albumin: 2.4 g/dL — ABNORMAL LOW (ref 3.5–5.0)
Alkaline Phosphatase: 1103 U/L — ABNORMAL HIGH (ref 38–126)
Anion gap: 16 — ABNORMAL HIGH (ref 5–15)
BUN: 72 mg/dL — ABNORMAL HIGH (ref 8–23)
CO2: 24 mmol/L (ref 22–32)
Calcium: 7.6 mg/dL — ABNORMAL LOW (ref 8.9–10.3)
Chloride: 94 mmol/L — ABNORMAL LOW (ref 98–111)
Creatinine, Ser: 3.38 mg/dL — ABNORMAL HIGH (ref 0.61–1.24)
GFR, Estimated: 18 mL/min — ABNORMAL LOW (ref >60)
Glucose, Bld: 137 mg/dL — ABNORMAL HIGH (ref 70–99)
Potassium: 4.7 mmol/L (ref 3.5–5.1)
Sodium: 134 mmol/L — ABNORMAL LOW (ref 135–145)
Total Bilirubin: 6.1 mg/dL — ABNORMAL HIGH (ref 0.3–1.2)
Total Protein: 5.1 g/dL — ABNORMAL LOW (ref 6.5–8.1)

## 2022-02-15 LAB — CBC
HCT: 21 % — ABNORMAL LOW (ref 39.0–52.0)
Hemoglobin: 7 g/dL — ABNORMAL LOW (ref 13.0–17.0)
MCH: 29.9 pg (ref 26.0–34.0)
MCHC: 33.3 g/dL (ref 30.0–36.0)
MCV: 89.7 fL (ref 80.0–100.0)
Platelets: 106 10*3/uL — ABNORMAL LOW (ref 150–400)
RBC: 2.34 MIL/uL — ABNORMAL LOW (ref 4.22–5.81)
RDW: 16.5 % — ABNORMAL HIGH (ref 11.5–15.5)
WBC: 15.4 10*3/uL — ABNORMAL HIGH (ref 4.0–10.5)
nRBC: 3.9 % — ABNORMAL HIGH (ref 0.0–0.2)

## 2022-02-15 LAB — PROTIME-INR
INR: 1.5 — ABNORMAL HIGH (ref 0.8–1.2)
Prothrombin Time: 17.9 seconds — ABNORMAL HIGH (ref 11.4–15.2)

## 2022-02-15 LAB — GLUCOSE, CAPILLARY
Glucose-Capillary: 132 mg/dL — ABNORMAL HIGH (ref 70–99)
Glucose-Capillary: 134 mg/dL — ABNORMAL HIGH (ref 70–99)
Glucose-Capillary: 145 mg/dL — ABNORMAL HIGH (ref 70–99)
Glucose-Capillary: 156 mg/dL — ABNORMAL HIGH (ref 70–99)
Glucose-Capillary: 96 mg/dL (ref 70–99)

## 2022-02-15 LAB — PREPARE RBC (CROSSMATCH)

## 2022-02-15 MED ORDER — FENTANYL CITRATE (PF) 100 MCG/2ML IJ SOLN
INTRAMUSCULAR | Status: AC
  Filled 2022-02-15: qty 2

## 2022-02-15 MED ORDER — HEPARIN SODIUM (PORCINE) 1000 UNIT/ML IJ SOLN
INTRAMUSCULAR | Status: AC
  Filled 2022-02-15: qty 10

## 2022-02-15 MED ORDER — PANTOPRAZOLE SODIUM 40 MG PO TBEC: 40.0000 mg | DELAYED_RELEASE_TABLET | Freq: Every day | ORAL | Status: DC

## 2022-02-15 MED ORDER — PANTOPRAZOLE SODIUM 40 MG PO TBEC
40.0000 mg | DELAYED_RELEASE_TABLET | Freq: Every day | ORAL | Status: DC
  Administered 2022-02-16 – 2022-03-04 (×15): 40 mg via ORAL
  Filled 2022-02-15 (×15): qty 1

## 2022-02-15 MED ORDER — FENTANYL CITRATE (PF) 100 MCG/2ML IJ SOLN
INTRAMUSCULAR | Status: AC | PRN
  Administered 2022-02-15 (×2): 25 ug via INTRAVENOUS

## 2022-02-15 MED ORDER — MIDAZOLAM HCL 2 MG/2ML IJ SOLN
INTRAMUSCULAR | Status: AC
  Filled 2022-02-15: qty 2

## 2022-02-15 MED ORDER — MIDAZOLAM HCL 2 MG/2ML IJ SOLN
INTRAMUSCULAR | Status: AC | PRN
  Administered 2022-02-15: .5 mg via INTRAVENOUS

## 2022-02-15 MED ORDER — SODIUM CHLORIDE 0.9% IV SOLUTION: Freq: Once | INTRAVENOUS | Status: DC

## 2022-02-15 MED ORDER — LIDOCAINE HCL 1 % IJ SOLN
INTRAMUSCULAR | Status: AC
  Filled 2022-02-15: qty 20

## 2022-02-15 MED ORDER — ANTICOAGULANT SODIUM CITRATE 4% (200MG/5ML) IV SOLN
5.0000 mL | Freq: Once | Status: AC
  Administered 2022-02-15: 3.2 mL
  Filled 2022-02-15: qty 5

## 2022-02-15 NOTE — Progress Notes (Signed)
Dressing to right ac incision  cleansed with saline , foam dressing applied.

## 2022-02-15 NOTE — Progress Notes (Signed)
        Daily Rounding Note  02/15/2022, 10:26 AM  LOS: 4 days   SUBJECTIVE:   Chief complaint: Upper GI bleed with CGE, blood loss anemia.     Staff reports no bowel movements overnight.  He is very sleepy after having had R IJ HD catheter placed.   Diet is renal, appetite reported as fair but just 10% consumption per charting.  OBJECTIVE:         Vital signs in last 24 hours:    Temp:  [97.6 F (36.4 C)-98.6 F (37 C)] 97.6 F (36.4 C) (08/07 0400) Pulse Rate:  [57-87] 63 (08/07 0911) Resp:  [12-35] 14 (08/07 0911) BP: (111-144)/(43-53) 114/45 (08/07 0911) SpO2:  [94 %-100 %] 94 % (08/07 0911) Weight:  [54 kg] 54 kg (08/07 0400) Last BM Date : 02/12/22 Filed Weights   02/24/2022 0400 02/25/2022 1558 02/15/22 0400  Weight: 53.4 kg 53.4 kg 54 kg   General: Sleeping, mouth gaping open.  Did eventually wake up during my exam but not verbalizing Neck: IJ catheter on right.  No significant bleeding at site of insertion. Heart: Harsh systolic murmur. Chest: No labored breathing.  No cough.  Lungs clear on anterior auscultation. Abdomen: Soft, not tender or distended.  Active bowel sounds Extremities: 2+ bilateral pedal edema. Neuro/Psych: Minimally responsive to exam.  Not make vigorous attempts to wake him up or have him perform tasks.  Intake/Output from previous day: 08/06 0701 - 08/07 0700 In: 240 [P.O.:240] Out: -   Intake/Output this shift: No intake/output data recorded.  Lab Results: Recent Labs    02/22/2022 0349 02/14/22 0630 02/15/22 0052  WBC 13.0* 15.9* 15.4*  HGB 7.2* 7.1* 7.0*  HCT 21.3* 21.5* 21.0*  PLT 80* 109* 106*   BMET Recent Labs    03/03/2022 0349 02/14/22 0341 02/15/22 0052  NA 133* 132* 134*  K 4.8 4.3 4.7  CL 93* 93* 94*  CO2 24 24 24  GLUCOSE 120* 116* 137*  BUN 91* 51* 72*  CREATININE 3.70* 2.55* 3.38*  CALCIUM 7.8* 7.5* 7.6*   LFT Recent Labs    02/16/2022 0349 02/14/22 0341  02/15/22 0052  PROT 5.0* 5.0* 5.1*  ALBUMIN 2.5* 2.4* 2.4*  AST 3,065* 1,180* 573*  ALT 1,045* 667* 469*  ALKPHOS 1,049* 1,000* 1,103*  BILITOT 4.1* 5.3* 6.1*  BILIDIR  --  3.1*  --    PT/INR Recent Labs    02/14/22 0341 02/15/22 0927  LABPROT 19.4* 17.9*  INR 1.7* 1.5*   Hepatitis Panel Recent Labs    02/24/2022 0349  HEPAIGM NON REACTIVE  HEPBIGM NON REACTIVE    Studies/Results: No results found.  Scheduled Meds:  sodium chloride   Intravenous Once   sodium chloride   Intravenous Once   sodium chloride   Intravenous Once   sodium chloride   Intravenous Once   Chlorhexidine Gluconate Cloth  6 each Topical Q0600   Chlorhexidine Gluconate Cloth  6 each Topical Q0600   Chlorhexidine Gluconate Cloth  6 each Topical Q0600   darbepoetin (ARANESP) injection - DIALYSIS  200 mcg Intravenous Q Sat-HD   diltiazem  180 mg Oral Daily   fentaNYL       heparin sodium (porcine)       insulin aspart  0-6 Units Subcutaneous Q4H   lidocaine       midazolam       multivitamin  1 tablet Oral QHS   oxymetazoline  1 spray Each Nare BID     pantoprazole (PROTONIX) IV  40 mg Intravenous Q12H   polyethylene glycol  17 g Oral BID   senna  2 tablet Oral Daily   sodium chloride flush  10-40 mL Intracatheter Q12H   Continuous Infusions:  sodium chloride Stopped (02/12/22 1243)   sodium chloride     anticoagulant sodium citrate 0 mL (02/11/22 0700)   ceFEPime (MAXIPIME) IV 1 g (02/14/22 2329)   PRN Meds:.alteplase, anticoagulant sodium citrate, bisacodyl, docusate sodium, fentaNYL, heparin sodium (porcine), HYDROmorphone (DILAUDID) injection, lidocaine, lidocaine-prilocaine, midazolam, ondansetron (ZOFRAN) IV, mouth rinse, prochlorperazine, sodium chloride flush   ASSESMENT:   CGE in setting of Epistaxis, Eliquis. Work-ups including EGD, SBE, VCE, colonoscopy in may 2021 June 2022.  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  At VCE there was heme present in areas of recent biopsies, AVMs at 2 and 3.5 hours felt to only be reachable with double-balloon enteroscopy. Dr. Constance Holster ENT noted R superficial septal ulceration on exam.  This was packed on 8/4.  Acute on chronic IDA.  Hb 6.1.Marland KitchenMarland Kitchen PRBCs x 2... 7 at midnight.  Baseline 7.5 to 8.   On ESA per Nephrology.    Thrombocytopenia.  Improving.  Platelets 39 K... 106.    Markedly elevated LFTs (T. bili, alk phos, transaminases), suspect shock liver.  Noncontrast CT 8/3 with normal liver parenchyma and biliary tree, normal spleen.  Liver Dopplers negative.  Amiodarone discontinued 8/5.  Today's LFTs with downtrending alk phos, transaminases but uptrending T. bili.  ANA, smooth muscle IgG, IgG, CMV IgM, EBV IgM, Hep A IgM, HBV core AB are all negative/non-reactive.    Atrial fibrillation, chronic Eliquis on hold, last dose 8/3.  Coagulopathy in conjunction with liver failure.  Improving INR.  CKD 4, now ESRD on HD.  Left retroperitoneal mass, bilateral renal cysts on recent CT  Tubular adenomatous colon polyps on 11/2019 colonoscopy, due surveillance study 11/2022.  Left paraganglioma w weight loss.  [facility] oncology follows.    Severe prot cal malnutrition.  Poor po intake.   Wt loss.     PLAN   Follow LFTs.  Not surprising that his T. Vergia Alcon is lagging improvement compared with other LFTs, not unexpected in a patient with now ESRD.  Follow CBC.  Transfuse as needed.  ESA oral or parenteral iron as needed.  GI signing off.  Primary GI is Dr. Lyndel Safe who plans surveillance Colonoscopy 11/2022.  Continue PPI, given past EGD findings this should be chronic pantoprazole 40 mg daily or other PPI equivalent.  I switched him from IV to oral.  Timing to resume Eliquis per ENT.    Might want to get dietician involved as to advice to boost pt's nutrition.      Azucena Freed   02/15/2022, 10:26 AM Phone [phone]

## 2022-02-15 NOTE — Progress Notes (Addendum)
Patient ID: Murry Diaz, male   DOB: April 19, 1946, 76 y.o.   MRN: [id number] S:Seen after TDC placement in IR and is very sleepy.  NAD O:BP (!) 114/45   Pulse 63   Temp 97.6 F (36.4 C) (Oral)   Resp 14   Ht '5\' 6"'$  (1.676 m)   Wt 54 kg   SpO2 94%   BMI 19.21 kg/m  No intake or output data in the 24 hours ending 02/15/22 1035 Intake/Output: I/O last 3 completed shifts: In: 240 [P.O.:240] Out: 30 [Urine:30]  Intake/Output this shift:  No intake/output data recorded. Weight change: 0.6 kg Gen: NAD CVS: RRR Resp: CTA Abd: +BS, soft, NT/ND Ext: 1+ pretibial edema bilaterally, RUE AVG +T/B, RIJ TDC in place  Recent Labs  Lab 02/12/2022 2130 02/26/2022 2140 02/11/22 0000 02/11/22 0250 02/11/22 0845 02/11/22 1351 02/11/22 1836 02/12/22 0054 02/12/22 0607 02/11/2022 0349 02/14/22 0341 02/15/22 0052  NA 140 135  135 127*  --  137  --   --   --  133* 133* 132* 134*  K 6.9* 6.6*  6.6* 5.8* 6.7* 5.3*  5.3* 5.7* 5.7* 3.4* 4.1 4.8 4.3 4.7  CL 100 104 91*  --  98  --   --   --  93* 93* 93* 94*  CO2 18*  --  14*  --  20*  --   --   --  '26 24 24 24  '$ GLUCOSE 63* 59* 792* 97 76  --   --   --  121* 120* 116* 137*  BUN 122* >130* 118*  --  85*  --   --   --  55* 91* 51* 72*  CREATININE 5.57* 6.00* 5.32*  --  4.02*  --   --   --  2.62* 3.70* 2.55* 3.38*  ALBUMIN 2.9*  --   --   --  2.5*  --   --   --  2.5*  2.5* 2.5* 2.4* 2.4*  CALCIUM 8.8*  --  8.5*  --  8.3*  --   --   --  8.1* 7.8* 7.5* 7.6*  PHOS  --   --   --   --   --   --   --   --  6.1*  --   --   --   AST 1,537*  --   --   --  9,636*  --   --   --  3,070* 3,065* 1,180* 573*  ALT 743*  --   --   --  3,686*  --   --   --  2,150* 1,045* 667* 469*   Liver Function Tests: Recent Labs  Lab 02/12/2022 0349 02/14/22 0341 02/15/22 0052  AST 3,065* 1,180* 573*  ALT 1,045* 667* 469*  ALKPHOS 1,049* 1,000* 1,103*  BILITOT 4.1* 5.3* 6.1*  PROT 5.0* 5.0* 5.1*  ALBUMIN 2.5* 2.4* 2.4*   No results for input(s): "LIPASE", "AMYLASE" in  the last 168 hours. No results for input(s): "AMMONIA" in the last 168 hours. CBC: Recent Labs  Lab 03/02/2022 2130 02/22/2022 2140 02/12/22 0607 02/12/22 1541 02/09/2022 0349 02/14/22 0630 02/15/22 0052  WBC 5.1   < > 9.8 10.9* 13.0* 15.9* 15.4*  NEUTROABS 4.4  --   --   --   --   --   --   HGB 6.8*   < > 6.4* 7.5* 7.2* 7.1* 7.0*  HCT 21.5*   < > 18.6* 22.6* 21.3* 21.5* 21.0*  MCV 92.3   < >  87.3 88.3 88.0 88.8 89.7  PLT 170   < > 39* 44* 80* 109* 106*   < > = values in this interval not displayed.   Cardiac Enzymes: No results for input(s): "CKTOTAL", "CKMB", "CKMBINDEX", "TROPONINI" in the last 168 hours. CBG: Recent Labs  Lab 02/14/22 1152 02/14/22 1618 02/14/22 1942 02/15/22 0018 02/15/22 0409  GLUCAP 131* 144* 166* 134* 156*    Iron Studies: No results for input(s): "IRON", "TIBC", "TRANSFERRIN", "FERRITIN" in the last 72 hours. Studies/Results: No results found.  sodium chloride   Intravenous Once   sodium chloride   Intravenous Once   sodium chloride   Intravenous Once   sodium chloride   Intravenous Once   Chlorhexidine Gluconate Cloth  6 each Topical Q0600   Chlorhexidine Gluconate Cloth  6 each Topical Q0600   Chlorhexidine Gluconate Cloth  6 each Topical Q0600   darbepoetin (ARANESP) injection - DIALYSIS  200 mcg Intravenous Q Sat-HD   diltiazem  180 mg Oral Daily   fentaNYL       heparin sodium (porcine)       insulin aspart  0-6 Units Subcutaneous Q4H   lidocaine       midazolam       multivitamin  1 tablet Oral QHS   oxymetazoline  1 spray Each Nare BID   pantoprazole (PROTONIX) IV  40 mg Intravenous Q12H   polyethylene glycol  17 g Oral BID   senna  2 tablet Oral Daily   sodium chloride flush  10-40 mL Intracatheter Q12H    BMET    Component Value Date/Time   NA 134 (L) 02/15/2022 0052   NA 137 06/19/2020 0936   K 4.7 02/15/2022 0052   CL 94 (L) 02/15/2022 0052   CO2 24 02/15/2022 0052   GLUCOSE 137 (H) 02/15/2022 0052   BUN 72 (H)  02/15/2022 0052   BUN 51 (H) 06/19/2020 0936   CREATININE 3.38 (H) 02/15/2022 0052   CREATININE 2.68 (H) 03/26/2016 1839   CALCIUM 7.6 (L) 02/15/2022 0052   GFRNONAA 18 (L) 02/15/2022 0052   GFRAA 21 (L) 06/19/2020 0936   CBC    Component Value Date/Time   WBC 15.4 (H) 02/15/2022 0052   RBC 2.34 (L) 02/15/2022 0052   HGB 7.0 (L) 02/15/2022 0052   HGB 9.1 (L) 09/14/2019 1555   HCT 21.0 (L) 02/15/2022 0052   HCT 28.4 (L) 09/14/2019 1555   PLT 106 (L) 02/15/2022 0052   PLT 464 (H) 09/14/2019 1555   MCV 89.7 02/15/2022 0052   MCV 82 09/14/2019 1555   MCH 29.9 02/15/2022 0052   MCHC 33.3 02/15/2022 0052   RDW 16.5 (H) 02/15/2022 0052   RDW 16.4 (H) 09/14/2019 1555   LYMPHSABS 0.3 (L) 02/26/2022 2130   LYMPHSABS 0.6 (L) 10/05/2017 1433   MONOABS 0.4 03/07/2022 2130   EOSABS 0.0 02/21/2022 2130   EOSABS 0.1 10/05/2017 1433   BASOSABS 0.0 03/07/2022 2130   BASOSABS 0.0 10/05/2017 1433     Assessment/Plan:  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  He had temp RIJ HD catheter placed 02/19/2022 and HD 02/11/22.  He has had 3 HD sessions and is due for his 4th today.  He has been set up with outpatient HD at [facility] on MWF schedule (12:15 chair time but will need to be there by 11:30 on first day to fill out paperwork). Junctional bradycardia - due to hyperkalemia as well as  amiodarone and diltiazem.  Resolved with dialysis. Vascular access - RIJ TDC placed 02/15/22, RUE AVG placed by [name] on 02/08/22. 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 FTT - due to paraganglioma and progressive CKD.  Continue with protein supplements.   Donetta Potts, MD [employer] [phone]

## 2022-02-15 NOTE — Progress Notes (Signed)
Back from HD by bed awake and alert. °

## 2022-02-15 NOTE — Evaluation (Signed)
Occupational Therapy Evaluation Patient Details Name: Adam Reid MRN: [id number] DOB: 11-17-1945 Today's Date: 02/15/2022   History of Present Illness The pt is a 76 yo male presenting 8/2 with generalized weakness and bradycardia. 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 8/7 placement of a right IJ approach tunneled HD catheter. PMH includes: CKD V with RUE AV graft placement 7/31, Left retroperitoneal paraganglioma, HFpEF, DM II, and HTN.   Clinical Impression   This 76 yo male admitted with above presents to acute OT with PLOF of being able to ambulate with [facility] by himself and do all of his basic ADLs except some help from his wife for bathing. He currently is setup/S (in supported sitting)-Mod A for basic ADLs, with Min A for sit<>stand and side step up towards [place] with RW. He will continue to benefit from acute OT with follow up [place].      Recommendations for follow up therapy are one component of a multi-disciplinary discharge planning process, led by the attending physician.  Recommendations may be updated based on patient status, additional functional criteria and insurance authorization.   Follow Up Recommendations  Home health OT    Assistance Recommended at Discharge Frequent or constant Supervision/Assistance  Patient can return home with the following A little help with walking and/or transfers;A lot of help with bathing/dressing/bathroom;Assistance with cooking/housework;Help with stairs or ramp for entrance;Assist for transportation;Direct supervision/assist for financial management;Direct supervision/assist for medications management    Functional Status Assessment  Patient has had a recent decline in their functional status and demonstrates the ability to make significant improvements in function in a reasonable and predictable amount of time.  Equipment Recommendations  None recommended by OT       Precautions / Restrictions Precautions Precautions:  Fall Restrictions Weight Bearing Restrictions: No      Mobility Bed Mobility Overal bed mobility: Needs Assistance Bed Mobility: Supine to Sit, Sit to Supine     Supine to sit: Min guard, HOB elevated Sit to supine: Min guard   General bed mobility comments: increased time    Transfers Overall transfer level: Needs assistance Equipment used: Rolling walker (2 wheels) Transfers: Sit to/from Stand Sit to Stand: Min assist           General transfer comment: minA to power up and steady in standing.      Balance Overall balance assessment: Needs assistance Sitting-balance support: No upper extremity supported, Single extremity supported Sitting balance-Leahy Scale: Poor Sitting balance - Comments: while seated and attempting to doff /donn socks he lost balance posteriorly and right   Standing balance support: Bilateral upper extremity supported Standing balance-Leahy Scale: Poor                             ADL either performed or assessed with clinical judgement   ADL Overall ADL's : Needs assistance/impaired Eating/Feeding: Set up Eating/Feeding Details (indicate cue type and reason): supported sitting Grooming: Set up Grooming Details (indicate cue type and reason): supported sitting Upper Body Bathing: Set up Upper Body Bathing Details (indicate cue type and reason): supported sitting Lower Body Bathing: Moderate assistance Lower Body Bathing Details (indicate cue type and reason): Mod for sitting balance and min A sit<>stand Upper Body Dressing : Minimal assistance Upper Body Dressing Details (indicate cue type and reason): supported sitting Lower Body Dressing: Moderate assistance Lower Body Dressing Details (indicate cue type and reason): Mod for sitting balance and min  A sit<>stand Toilet Transfer: Minimal assistance;Stand-pivot;Rolling walker (2 wheels)   Toileting- Clothing Manipulation and Hygiene: Moderate assistance Toileting - Clothing  Manipulation Details (indicate cue type and reason): min A sit<>stand             Vision Baseline Vision/History: 0 No visual deficits Patient Visual Report: No change from baseline              Pertinent Vitals/Pain Pain Assessment Pain Assessment: No/denies pain     Hand Dominance Left   Extremity/Trunk Assessment Upper Extremity Assessment Upper Extremity Assessment: Generalized weakness           Communication Communication Communication: Prefers language other than English (Farsi--used ipad interperter system)   Cognition Arousal/Alertness: Lethargic (recently returned from IR for dialysis cath placement) Behavior During Therapy: Flat affect Overall Cognitive Status: Within Functional Limits for tasks assessed                                       General Comments  VSS on 4 liters            Home Living Family/patient expects to be discharged to:: Private residence Living Arrangements: Spouse/significant other Available Help at Discharge: Family;Available 24 hours/day Type of Home: House Home Access: Level entry     Home Layout: One level     Bathroom Shower/Tub: Teacher, early years/pre: Standard     Home Equipment: Conservation officer, nature (2 wheels);Cane - single point;BSC/3in1;Shower seat;Wheelchair - manual   Additional Comments: Has equipment from his father in law but does not use      Prior Functioning/Environment Prior Level of Function : Needs assist       Physical Assist : ADLs (physical)   ADLs (physical): Bathing Mobility Comments: pt reports use of cane at home ADLs Comments: pt reports wife assists with bathing, uses shower seat. wife completes IADLs        OT Problem List: Decreased strength;Impaired balance (sitting and/or standing);Decreased activity tolerance;Decreased cognition;Decreased safety awareness      OT Treatment/Interventions: Self-care/ADL training;DME and/or AE  instruction;Patient/family education;Balance training    OT Goals(Current goals can be found in the care plan section) Acute Rehab OT Goals Patient Stated Goal: did not state--agreeable to get up and work with me OT Goal Formulation: With patient Time For Goal Achievement: 03/01/22 Potential to Achieve Goals: Good  OT Frequency: Min 2X/week       AM-PAC OT "6 Clicks" Daily Activity     Outcome Measure Help from another person eating meals?: A Little Help from another person taking care of personal grooming?: A Little Help from another person toileting, which includes using toliet, bedpan, or urinal?: A Lot Help from another person bathing (including washing, rinsing, drying)?: A Lot Help from another person to put on and taking off regular upper body clothing?: A Lot Help from another person to put on and taking off regular lower body clothing?: A Lot 6 Click Score: 14   End of Session Equipment Utilized During Treatment:  (4 lites) Nurse Communication:  (RN came into room as he was sitting EOB and again as I layed him down)  Activity Tolerance: Patient tolerated treatment well Patient left: in bed;with call bell/phone within reach;with bed alarm set  OT Visit Diagnosis: Unsteadiness on feet (R26.81);Other abnormalities of gait and mobility (R26.89)  Time: 1045-1106 OT Time Calculation (min): 21 min Charges:  OT General Charges $OT Visit: 1 Visit OT Evaluation $OT Eval Moderate Complexity: 1 Mod  [address], OTR/L [facility] [phone] Office [phone]  '  Almon Register 02/15/2022, 11:21 AM

## 2022-02-15 NOTE — Progress Notes (Signed)
Progress Note  Patient: Adam Reid GYI:[id number] DOB: [birth year]  DOA: 03/03/2022  DOS: 02/15/2022    Brief hospital course: Malek Skog is a 76 y.o. male with a history of retroperitoneal paraganglioma, stage V CKD, chronic HFpEF, PAF who presented to the ED 8/2 with weakness, leg swelling. He was found to have junctional bradycardia with hyperkalemia, acidosis, uremia, volume overload, and elevated LFTs. Imaging revealed loculated right pleural effusion. Temporary HD catheter was inserted and HD initiated urgently on 8/3. The hospitalization has been complicated by respiratory failure, liver failure and thrombocytopenia, epistaxis, coffee-ground emesis, and anemia requiring transfusions.   Assessment and Plan: ESRD: New initiation of hemodialysis this admission 8/3, now s/p tunneled HD catheter placed 8/7 by IR, Dr. Earleen Newport. Also has RUE AVG placed 7/31.  - HD 8/7.   Acute hypoxic respiratory failure: Due to acute on chronic HFpEF due to renal failure.  - This has improved, but remains hypoxemic.  - Wean oxygen as tolerated. Imaging thus far with R > L pleural effusion that appears to have been there since 2021. Repeat CXR in AM, consider pulmonary consultation if unable to wean.  Liver failure with coagulopathy: Normal morphology on CT without biliary obstruction, dopplers negative, serological work up including  ANA, smooth muscle IgG, IgG, CMV IgM, EBV IgM, Hep A IgM, HBV core AB has returned negative, GI suspects congestive hepatopathy/initial insult. LFTs have begun showing improvement, though cholestatic pattern remains. No plan for Bx per GI. - Continue monitoring LFTs, treating supportively.  - Holding amiodarone  Acute blood loss anemia on chronic anemia of iron deficiency and CKD:  - s/p 3u (8/3, 8/4, 8/7). Still holding anticoagulation.  - Giving 1u PRBCs w/HD 8/7 and recheck in AM. - Continue ESA per nephrology.  Coffee-ground emesis, history of GERD, Barrett's  esophagus, small bowel AVMs.  - GI consulted, was planning enteroscopy, cancelled with resolved bleeding and concern for liver failure. GI has signed off without plans for further evaluation.  - Continue PPI  Thrombocytopenia: Improved s/p 1u 8/4. - Stabilizing level just above 100k, will monitor.   Epistaxis: s/p packing by ENT 8/4, since discontinued. Resolved.  Acute on chronic HFpEF: Still with LE edema, but improved. LVEF 55-60%, stable RV function. - Volume managed by dialysis.    Paroxysmal AFib with junctional bradycardia on arrival which has resolved:  - Continue diltiazem for rate control since no longer junctional. - Amiodarone discontinued with liver failure  Sepsis due to Pseudomonas UTI:  - Continue cefepime (started 8/5). Leukocytosis has continued rising, though remains afebrile.   Retroperitoneal paraganglioma: BP remains low at this time.  - BP will not allow for cardura resumption - Will follow up with [facility] after discharge.   T2DM: HbA1c 7.4%. - Continue SSI  Hyperkalemia, metabolic acidosis, hyponatremia: All improved and continuing to be managed with dialysis.   Severe protein calorie malnutrition:  - Supplement protein. RD consulted.   Subjective: Seen in HD, has been drowsy but has no pain, no bleeding or new bruising.   Objective: Vitals:   02/15/22 1400 02/15/22 1427 02/15/22 1430 02/15/22 1439  BP: (!) 116/45 (!) 121/49 (!) 121/49   Pulse: 71 77 75   Resp: 10 (!) 21 16   Temp:  98 F (36.7 C)  98 F (36.7 C)  TempSrc:  Oral  Oral  SpO2: 100%  100%   Weight:      Height:       Gen: Thin frail 76 y.o. male in no  distress Pulm: Nonlabored breathing supplemental oxygen, no wheezes or crackles. Diminished at R > L bases. CV: Regular rate and rhythm. No murmur, rub, or gallop. No JVD  GI: Abdomen soft, non-tender, non-distended, with normoactive bowel sounds.  Ext: Warm, dry, 1+ dependent edema with no deformities. Right AVG  +bruit and thrill. Right IJ tunneled catheter site c/d/I, accessed.  Skin: Mild jaundice without rashes, lesions or ulcers on visualized skin. Neuro: Alert and oriented. No asterixis. No focal neurological deficits. Psych: Calm.  Data Personally reviewed: CBC: Recent Labs  Lab 02/22/2022 2130 03/03/2022 2140 02/12/22 0607 02/12/22 1541 03/08/2022 0349 02/14/22 0630 02/15/22 0052  WBC 5.1   < > 9.8 10.9* 13.0* 15.9* 15.4*  NEUTROABS 4.4  --   --   --   --   --   --   HGB 6.8*   < > 6.4* 7.5* 7.2* 7.1* 7.0*  HCT 21.5*   < > 18.6* 22.6* 21.3* 21.5* 21.0*  MCV 92.3   < > 87.3 88.3 88.0 88.8 89.7  PLT 170   < > 39* 44* 80* 109* 106*   < > = values in this interval not displayed.   Basic Metabolic Panel: Recent Labs  Lab 02/09/2022 2130 02/12/2022 2140 02/11/22 0845 02/11/22 1351 02/12/22 0054 02/12/22 0607 02/14/2022 0349 02/14/22 0341 02/15/22 0052  NA 140   < > 137  --   --  133* 133* 132* 134*  K 6.9*   < > 5.3*  5.3*   < > 3.4* 4.1 4.8 4.3 4.7  CL 100   < > 98  --   --  93* 93* 93* 94*  CO2 18*   < > 20*  --   --  $R'26 24 24 24  'lk$ GLUCOSE 63*   < > 76  --   --  121* 120* 116* 137*  BUN 122*   < > 85*  --   --  55* 91* 51* 72*  CREATININE 5.57*   < > 4.02*  --   --  2.62* 3.70* 2.55* 3.38*  CALCIUM 8.8*   < > 8.3*  --   --  8.1* 7.8* 7.5* 7.6*  MG 2.9*  --   --   --   --   --  2.1  --   --   PHOS  --   --   --   --   --  6.1*  --   --   --    < > = values in this interval not displayed.   GFR: Estimated Creatinine Clearance: 14.6 mL/min (A) (by C-G formula based on SCr of 3.38 mg/dL (H)). Liver Function Tests: Recent Labs  Lab 02/11/22 0845 02/12/22 0607 02/21/2022 0349 02/14/22 0341 02/15/22 0052  AST 9,636* 3,070* 3,065* 1,180* 573*  ALT 3,686* 2,150* 1,045* 667* 469*  ALKPHOS 1,645* 1,318* 1,049* 1,000* 1,103*  BILITOT 2.6* 3.2* 4.1* 5.3* 6.1*  PROT 5.9* 5.5* 5.0* 5.0* 5.1*  ALBUMIN 2.5* 2.5*  2.5* 2.5* 2.4* 2.4*   No results for input(s): "LIPASE", "AMYLASE" in the  last 168 hours. No results for input(s): "AMMONIA" in the last 168 hours. Coagulation Profile: Recent Labs  Lab 02/11/22 0921 02/12/22 0607 02/12/2022 0349 02/14/22 0341 02/15/22 0927  INR 2.3* 3.1* 2.3* 1.7* 1.5*   Cardiac Enzymes: No results for input(s): "CKTOTAL", "CKMB", "CKMBINDEX", "TROPONINI" in the last 168 hours. BNP (last 3 results) Recent Labs    12/10/21 1339  PROBNP 12,645*   HbA1C: No results for input(s): "  HGBA1C" in the last 72 hours. CBG: Recent Labs  Lab 02/14/22 1618 02/14/22 1942 02/15/22 0018 02/15/22 0409 02/15/22 1204  GLUCAP 144* 166* 134* 156* 132*   Lipid Profile: No results for input(s): "CHOL", "HDL", "LDLCALC", "TRIG", "CHOLHDL", "LDLDIRECT" in the last 72 hours. Thyroid Function Tests: No results for input(s): "TSH", "T4TOTAL", "FREET4", "T3FREE", "THYROIDAB" in the last 72 hours. Anemia Panel: No results for input(s): "VITAMINB12", "FOLATE", "FERRITIN", "TIBC", "IRON", "RETICCTPCT" in the last 72 hours. Urine analysis:    Component Value Date/Time   COLORURINE AMBER (A) 02/11/2022 0845   APPEARANCEUR CLOUDY (A) 02/11/2022 0845   LABSPEC 1.010 02/11/2022 0845   PHURINE 5.0 02/11/2022 0845   GLUCOSEU NEGATIVE 02/11/2022 0845   HGBUR LARGE (A) 02/11/2022 0845   BILIRUBINUR NEGATIVE 02/11/2022 0845   BILIRUBINUR negative 03/02/2021 1458   BILIRUBINUR neg 03/23/2015 1404   KETONESUR NEGATIVE 02/11/2022 0845   PROTEINUR 100 (A) 02/11/2022 0845   UROBILINOGEN 0.2 03/02/2021 1458   NITRITE NEGATIVE 02/11/2022 0845   LEUKOCYTESUR SMALL (A) 02/11/2022 0845   Recent Results (from the past 240 hour(s))  MRSA Next Gen by PCR, Nasal     Status: None   Collection Time: 02/11/22  1:50 AM   Specimen: Nasal Mucosa; Nasal Swab  Result Value Ref Range Status   MRSA by PCR Next Gen NOT DETECTED NOT DETECTED Final    Comment: (NOTE) The GeneXpert MRSA Assay (FDA approved for NASAL specimens only), is one component of a comprehensive MRSA  colonization surveillance program. It is not intended to diagnose MRSA infection nor to guide or monitor treatment for MRSA infections. Test performance is not FDA approved in patients less than 56 years old. Performed at [facility], [place] [address] [place], [place] [address]   Culture, Urine (Do not remove urinary catheter, catheter placed by urology or difficult to place)     Status: Abnormal   Collection Time: 02/11/22  1:51 PM   Specimen: Urine, Catheterized  Result Value Ref Range Status   Specimen Description URINE, CATHETERIZED  Final   Special Requests   Final    NONE Performed at [facility], [place] [address], [place], [place] [address]    Culture 30,000 COLONIES/mL PSEUDOMONAS PUTIDA (A)  Final   Report Status 03/08/2022 FINAL  Final   Organism ID, Bacteria PSEUDOMONAS PUTIDA (A)  Final      Susceptibility   Pseudomonas putida - MIC*    CEFTAZIDIME 2 SENSITIVE Sensitive     CIPROFLOXACIN <=0.25 SENSITIVE Sensitive     GENTAMICIN <=1 SENSITIVE Sensitive     IMIPENEM 0.5 SENSITIVE Sensitive     PIP/TAZO 8 SENSITIVE Sensitive     * 30,000 COLONIES/mL PSEUDOMONAS PUTIDA     IR Fluoro Guide CV Line Right  Result Date: 02/15/2022 INDICATION: 76 year old male presents for tunneled hemodialysis catheter placement EXAM: IMAGE GUIDED TUNNELED HEMODIALYSIS CATHETER. MEDICATIONS: 2 G ANCEF. The antibiotic was given in an appropriate time interval prior to skin puncture. ANESTHESIA/SEDATION: Moderate (conscious) sedation was employed during this procedure. A total of Versed 0.5 mg and Fentanyl 50 mcg was administered intravenously by the radiology nurse. Total intra-service moderate Sedation Time: 25 minutes. The patient's level of consciousness and vital signs were monitored continuously by radiology nursing throughout the procedure under my direct supervision. FLUOROSCOPY: Radiation Exposure Index (as provided by the fluoroscopic device): 1 mGy Kerma COMPLICATIONS: None  PROCEDURE: Informed written consent was obtained from the patient after a discussion of the risks, benefits, and alternatives to  treatment. Questions regarding the procedure were encouraged and answered. The right neck and chest were prepped with chlorhexidine in a sterile fashion, and a sterile drape was applied covering the operative field. Maximum barrier sterile technique with sterile gowns and gloves were used for the procedure. A timeout was performed prior to the initiation of the procedure. Ultrasound survey was performed. The right internal jugular vein was confirmed to be patent, with images stored and sent to PACS. Micropuncture kit was utilized to access the right internal jugular vein under direct, real-time ultrasound guidance after the overlying soft tissues were anesthetized with 1% lidocaine with epinephrine. Stab incision was made with 11 blade scalpel. Microwire was passed centrally. The microwire was then marked to measure appropriate internal catheter length. External tunneled length was estimated. A total tip to cuff length of 19 cm was selected. 035 guidewire was advanced to the level of the IVC. Skin and subcutaneous tissues of chest wall below the clavicle were generously infiltrated with 1% lidocaine for local anesthesia. A small stab incision was made with 11 blade scalpel. The selected hemodialysis catheter was tunneled in a retrograde fashion from the anterior chest wall to the venotomy incision. Serial dilation was performed and then a peel-away sheath was placed. The catheter was then placed through the peel-away sheath with tips ultimately positioned within the superior aspect of the right atrium. Final catheter positioning was confirmed and documented with a spot radiographic image. The catheter aspirates and flushes normally. The catheter was flushed with appropriate volume heparin dwells. The catheter exit site was secured with a 0-Prolene retention suture. The venotomy incision  was closed Derma bond and sterile dressing. Dressings were applied at the chest wall. Patient tolerated the procedure well and remained hemodynamically stable throughout. No complications were encountered and no significant blood loss encountered. IMPRESSION: Status post image guided right IJ tunneled hemodialysis catheter. Signed, Dulcy Fanny. Nadene Rubins, RPVI [facility] Radiology Electronically Signed   By: Corrie Mckusick D.O.   On: 02/15/2022 13:06   IR US Guide Vasc Access Right  Result Date: 02/15/2022 INDICATION: 76 year old male presents for tunneled hemodialysis catheter placement EXAM: IMAGE GUIDED TUNNELED HEMODIALYSIS CATHETER. MEDICATIONS: 2 G ANCEF. The antibiotic was given in an appropriate time interval prior to skin puncture. ANESTHESIA/SEDATION: Moderate (conscious) sedation was employed during this procedure. A total of Versed 0.5 mg and Fentanyl 50 mcg was administered intravenously by the radiology nurse. Total intra-service moderate Sedation Time: 25 minutes. The patient's level of consciousness and vital signs were monitored continuously by radiology nursing throughout the procedure under my direct supervision. FLUOROSCOPY: Radiation Exposure Index (as provided by the fluoroscopic device): 1 mGy Kerma COMPLICATIONS: None PROCEDURE: Informed written consent was obtained from the patient after a discussion of the risks, benefits, and alternatives to treatment. Questions regarding the procedure were encouraged and answered. The right neck and chest were prepped with chlorhexidine in a sterile fashion, and a sterile drape was applied covering the operative field. Maximum barrier sterile technique with sterile gowns and gloves were used for the procedure. A timeout was performed prior to the initiation of the procedure. Ultrasound survey was performed. The right internal jugular vein was confirmed to be patent, with images stored and sent to  PACS. Micropuncture kit was utilized to access the right internal jugular vein under direct, real-time ultrasound guidance after the overlying soft tissues were anesthetized with 1% lidocaine with epinephrine. Stab incision was made with 11 blade scalpel. Microwire was passed centrally.  The microwire was then marked to measure appropriate internal catheter length. External tunneled length was estimated. A total tip to cuff length of 19 cm was selected. 035 guidewire was advanced to the level of the IVC. Skin and subcutaneous tissues of chest wall below the clavicle were generously infiltrated with 1% lidocaine for local anesthesia. A small stab incision was made with 11 blade scalpel. The selected hemodialysis catheter was tunneled in a retrograde fashion from the anterior chest wall to the venotomy incision. Serial dilation was performed and then a peel-away sheath was placed. The catheter was then placed through the peel-away sheath with tips ultimately positioned within the superior aspect of the right atrium. Final catheter positioning was confirmed and documented with a spot radiographic image. The catheter aspirates and flushes normally. The catheter was flushed with appropriate volume heparin dwells. The catheter exit site was secured with a 0-Prolene retention suture. The venotomy incision was closed Derma bond and sterile dressing. Dressings were applied at the chest wall. Patient tolerated the procedure well and remained hemodynamically stable throughout. No complications were encountered and no significant blood loss encountered. IMPRESSION: Status post image guided right IJ tunneled hemodialysis catheter. Signed, Dulcy Fanny. Nadene Rubins, RPVI [facility] Radiology Electronically Signed   By: Corrie Mckusick D.O.   On: 02/15/2022 13:06     Family Communication: None at bedside  Disposition: Status is: Inpatient Remains inpatient appropriate because:  Persistent liver failure, new renal failure, transfusion-dependent anemia Planned Discharge Destination: Home with [place], MD 02/15/2022 2:59 PM Page by [email or web address]

## 2022-02-15 NOTE — Progress Notes (Signed)
Attempted to meet with pt and pt's family at bedside to provide schedule letter regarding out-pt HD schedule at d/c. Pt currently in HD and no family in the room. Spoke to pt's son on Friday via phone to provide out-pt HD info. Will attempt to see pt again tomorrow to provide information sheet.   Melven Sartorius Renal Navigator [phone]

## 2022-02-15 NOTE — Progress Notes (Signed)
Back from IR by bed sleepy.

## 2022-02-15 NOTE — Procedures (Signed)
Interventional Radiology Procedure Note  Procedure: Placement of a right IJ approach tunneled HD catheter.  Tip is positioned at the superior cavoatrial junction and catheter is ready for immediate use.  Temp cath removed in VIR.  Complications: None Recommendations:  - Ok to use - Do not submerge - Routine line care   Signed,  Dulcy Fanny. Earleen Newport, DO

## 2022-02-15 NOTE — Care Management Important Message (Signed)
Important Message  Patient Details  Name: Adam Reid MRN: [id number] Date of Birth: 23-Nov-1945   Medicare Important Message Given:  Yes     Maurita Havener Montine Circle 02/15/2022, 2:56 PM

## 2022-02-15 NOTE — Progress Notes (Signed)
Received patient in bed to unit. Alert and oriented. Informed consent signed and in  chart.   Treatment initiated: 1300 Treatment completed: 1634  Patient tolerated well. Report given patient's floor nurse. Transported back to the room alert, without acute distress.  .   Access used: Right Catheter  Access issues: None  Total UF removed: 1300 Medication(s) given: N/A.  Blood transfusion tolerated well with no complaints of discomfort.   Post HD VS: 124/50 79 16 96% on 5L 97.4  Post HD weight: 56.3kg    [facility]

## 2022-02-15 NOTE — Progress Notes (Signed)
Transported to HD by bed awake and alert. Discussed with LPN who agreed to give 1 unit of blood  during HD.

## 2022-02-15 NOTE — Progress Notes (Signed)
Transported to IR for hd cath insertion.

## 2022-02-15 NOTE — Procedures (Signed)
HD Note Called for handoff to bring the patient to dialysis.  He is currently in IR for catheter placement.  Treatment will be rescheduled.

## 2022-02-15 NOTE — Progress Notes (Signed)
Transfusion began at approx. 1449. Patient tolerated treatment well. Transfusion ended at approx. 1550. BP 111/43, 76, 93%, 18 RR. Patient remains resting with eyes closed in semi fowler position while HD treatment remains in process.

## 2022-02-16 ENCOUNTER — Telehealth: Payer: Self-pay

## 2022-02-16 ENCOUNTER — Inpatient Hospital Stay (HOSPITAL_COMMUNITY): Payer: Medicare Other

## 2022-02-16 DIAGNOSIS — I10 Essential (primary) hypertension: Secondary | ICD-10-CM | POA: Diagnosis not present

## 2022-02-16 DIAGNOSIS — K7200 Acute and subacute hepatic failure without coma: Secondary | ICD-10-CM | POA: Diagnosis not present

## 2022-02-16 DIAGNOSIS — N186 End stage renal disease: Secondary | ICD-10-CM | POA: Diagnosis not present

## 2022-02-16 DIAGNOSIS — R609 Edema, unspecified: Secondary | ICD-10-CM | POA: Diagnosis not present

## 2022-02-16 DIAGNOSIS — I5032 Chronic diastolic (congestive) heart failure: Secondary | ICD-10-CM | POA: Diagnosis not present

## 2022-02-16 LAB — CBC
HCT: 25.6 % — ABNORMAL LOW (ref 39.0–52.0)
Hemoglobin: 8.9 g/dL — ABNORMAL LOW (ref 13.0–17.0)
MCH: 30.8 pg (ref 26.0–34.0)
MCHC: 34.8 g/dL (ref 30.0–36.0)
MCV: 88.6 fL (ref 80.0–100.0)
Platelets: 107 10*3/uL — ABNORMAL LOW (ref 150–400)
RBC: 2.89 MIL/uL — ABNORMAL LOW (ref 4.22–5.81)
RDW: 16 % — ABNORMAL HIGH (ref 11.5–15.5)
WBC: 16.6 10*3/uL — ABNORMAL HIGH (ref 4.0–10.5)
nRBC: 3.8 % — ABNORMAL HIGH (ref 0.0–0.2)

## 2022-02-16 LAB — COMPREHENSIVE METABOLIC PANEL
ALT: 257 U/L — ABNORMAL HIGH (ref 0–44)
AST: 310 U/L — ABNORMAL HIGH (ref 15–41)
Albumin: 2.4 g/dL — ABNORMAL LOW (ref 3.5–5.0)
Alkaline Phosphatase: 1205 U/L — ABNORMAL HIGH (ref 38–126)
Anion gap: 13 (ref 5–15)
BUN: 34 mg/dL — ABNORMAL HIGH (ref 8–23)
CO2: 26 mmol/L (ref 22–32)
Calcium: 7.7 mg/dL — ABNORMAL LOW (ref 8.9–10.3)
Chloride: 95 mmol/L — ABNORMAL LOW (ref 98–111)
Creatinine, Ser: 2.04 mg/dL — ABNORMAL HIGH (ref 0.61–1.24)
GFR, Estimated: 34 mL/min — ABNORMAL LOW (ref >60)
Glucose, Bld: 218 mg/dL — ABNORMAL HIGH (ref 70–99)
Potassium: 3.9 mmol/L (ref 3.5–5.1)
Sodium: 134 mmol/L — ABNORMAL LOW (ref 135–145)
Total Bilirubin: 7.9 mg/dL — ABNORMAL HIGH (ref 0.3–1.2)
Total Protein: 5.3 g/dL — ABNORMAL LOW (ref 6.5–8.1)

## 2022-02-16 LAB — TYPE AND SCREEN
ABO/RH(D): A POS
Antibody Screen: NEGATIVE
Unit division: 0

## 2022-02-16 LAB — BPAM RBC
Blood Product Expiration Date: 202308292359
ISSUE DATE / TIME: 202308071350
Unit Type and Rh: 6200

## 2022-02-16 LAB — PROCALCITONIN: Procalcitonin: 7.39 ng/mL

## 2022-02-16 LAB — GLUCOSE, CAPILLARY
Glucose-Capillary: 116 mg/dL — ABNORMAL HIGH (ref 70–99)
Glucose-Capillary: 165 mg/dL — ABNORMAL HIGH (ref 70–99)
Glucose-Capillary: 155 mg/dL — ABNORMAL HIGH (ref 70–99)
Glucose-Capillary: 233 mg/dL — ABNORMAL HIGH (ref 70–99)
Glucose-Capillary: 165 mg/dL — ABNORMAL HIGH (ref 70–99)
Glucose-Capillary: 173 mg/dL — ABNORMAL HIGH (ref 70–99)
Glucose-Capillary: 146 mg/dL — ABNORMAL HIGH (ref 70–99)

## 2022-02-16 LAB — PROTIME-INR
INR: 1.3 — ABNORMAL HIGH (ref 0.8–1.2)
Prothrombin Time: 16.1 seconds — ABNORMAL HIGH (ref 11.4–15.2)

## 2022-02-16 MED ORDER — LIDOCAINE-EPINEPHRINE 1 %-1:100000 IJ SOLN
20.0000 mL | Freq: Once | INTRAMUSCULAR | Status: DC
  Filled 2022-02-16: qty 20

## 2022-02-16 MED ORDER — INSULIN ASPART 100 UNIT/ML IJ SOLN
0.0000 [IU] | Freq: Three times a day (TID) | INTRAMUSCULAR | Status: DC
  Administered 2022-02-16 – 2022-02-19 (×3): 1 [IU] via SUBCUTANEOUS
  Administered 2022-02-20: 3 [IU] via SUBCUTANEOUS
  Administered 2022-02-22: 2 [IU] via SUBCUTANEOUS
  Administered 2022-02-23 – 2022-03-02 (×6): 1 [IU] via SUBCUTANEOUS

## 2022-02-16 MED ORDER — SENNA 8.6 MG PO TABS: 2.0000 | ORAL_TABLET | Freq: Every day | ORAL | Status: DC | PRN

## 2022-02-16 MED ORDER — NEPRO/CARBSTEADY PO LIQD
237.0000 mL | Freq: Two times a day (BID) | ORAL | Status: DC
  Administered 2022-02-16 – 2022-02-23 (×12): 237 mL via ORAL

## 2022-02-16 MED ORDER — TRAZODONE HCL 50 MG PO TABS
50.0000 mg | ORAL_TABLET | Freq: Every day | ORAL | Status: DC
  Administered 2022-02-16 – 2022-03-03 (×10): 50 mg via ORAL
  Filled 2022-02-16 (×17): qty 1

## 2022-02-16 NOTE — Progress Notes (Addendum)
Progress Note  Patient: Adam Reid DPO:[id number] DOB: Jun 11, 1946  DOA: 02/12/2022  DOS: 02/16/2022    Brief hospital course: Adam Reid is a 76 y.o. male with a history of retroperitoneal paraganglioma, stage V CKD, chronic HFpEF, PAF who presented to the ED 8/2 with weakness, leg swelling. He was found to have junctional bradycardia with hyperkalemia, acidosis, uremia, volume overload, and elevated LFTs. Imaging revealed loculated right pleural effusion. Temporary HD catheter was inserted and HD initiated urgently on 8/3. The hospitalization has been complicated by respiratory failure, liver failure and thrombocytopenia, epistaxis, coffee-ground emesis, and anemia requiring transfusions.   Assessment and Plan: ESRD: New initiation of hemodialysis this admission 8/3, now s/p tunneled HD catheter placed 8/7 by IR, Dr. Earleen Newport. Also has RUE AVG placed 7/31.  - HD 8/7, planning again 8/9. Still with edema and JVD.  Acute on chronic hypoxic respiratory failure: Due to acute on chronic HFpEF due to renal failure. Per pt/spouse, he's been on 2-3L for several months.  - Wean oxygen as tolerated. Imaging thus far with R > L pleural effusion that appears to have been there since 2021. Repeat CXR this AM personally reviewed with stable bilateral R > L pleural effusions, no new consolidation. Will attempt to wean from 4L today to home flow rate.  Liver failure with coagulopathy: Normal morphology on CT without biliary obstruction, dopplers negative, serological work up including  ANA, smooth muscle IgG, IgG, CMV IgM, EBV IgM, Hep A IgM, HBV core AB has returned negative, GI suspects congestive hepatopathy/initial insult. LFTs have begun showing improvement, though cholestatic pattern remains. No plan for Bx per GI. - Continue monitoring LFTs, treating supportively. Check fBili, GGT. - Holding amiodarone  Acute blood loss anemia on chronic anemia of iron deficiency and CKD:  - s/p 3u (8/3, 8/4, 8/7).  Still holding anticoagulation.  - Giving 1u PRBCs w/HD 8/7, hgb up as anticipated to 8.9. - Continue ESA per nephrology.  Coffee-ground emesis, history of GERD, Barrett's esophagus, small bowel AVMs.  - GI consulted, was planning enteroscopy, cancelled with resolved bleeding and concern for liver failure. GI has signed off without plans for further evaluation.  - Continue PPI - No further bleeding noted at this time.   Thrombocytopenia: Improved s/p 1u 8/4. - Stabilizing level just above 100k, will monitor.   Epistaxis: s/p packing by ENT 8/4, since discontinued. Resolved.  Acute on chronic HFpEF: Still with LE edema, but improved. LVEF 55-60%, stable RV function. - Volume managed by dialysis.    Paroxysmal AFib with junctional bradycardia on arrival which has resolved:  - Continue diltiazem for rate control since no longer junctional. - Amiodarone discontinued with liver failure  Sepsis due to Pseudomonas UTI:  - Continue cefepime (started 8/5). Leukocytosis has continued rising, though remains afebrile.   Right AVG wound dehiscence: Distal wound with mild dehiscence, no evidence of local infection.  - [name] closed wound at bedside 8/8, will follow up in 10-14 days.  Retroperitoneal paraganglioma: BP remains low at this time.  - BP will not allow for cardura resumption - Will follow up with endocrine surgery at [facility] after discharge.   T2DM: HbA1c 7.4%. - Continue SSI  Hyperkalemia, metabolic acidosis, hyponatremia: All improved and continuing to be managed with dialysis.   Severe protein calorie malnutrition:  - Supplement protein. RD consulted.   Leg swelling is persistent: In setting of malignancy, holding anticoagulation, will r/o DVT w/venous U/S.  Subjective: He is looking better per family and RN today, would like  to go home soon, he's been noticing increasing yellow skin but no pruritus. No tremor noted. Denies feeling fever or chills.   Objective: Vitals:    02/16/22 0000 02/16/22 0400 02/16/22 0700 02/16/22 1100  BP: (!) 126/47 (!) 128/43  (!) 110/45  Pulse: 64 66    Resp: $Remo'14 19  16  'GJcJT$ Temp: (!) 97.4 F (36.3 C) 97.6 F (36.4 C) 98.3 F (36.8 C) 98.6 F (37 C)  TempSrc: Oral Oral Oral Oral  SpO2: 96% 96%  95%  Weight:      Height:       Gen: Cachectic-appearing, pleasant male in no distress Pulm: Nonlabored breathing 4L O2. Diminished at bases, no wheezes. CV: Regular rate and rhythm. No new murmur, rub, or gallop. + JVD, 2+ dependent edema. GI: Abdomen soft, non-tender, non-distended, with normoactive bowel sounds.  Ext: Warm, no deformities. RUE AVG +T/B, AC transverse wound with medial > lateral dehiscence without exudate or erythema, hemostatic.  Skin: Diffuse jaundice, no rashes, lesions or ulcers on visualized skin. Neuro: Alert and oriented. No asterixis or tremor, no focal neurological deficits. Psych: Judgement and insight appear fair. Mood depressed & affect congruent. Behavior is appropriate.    Data Personally reviewed: CBC: Recent Labs  Lab 03/03/2022 2130 02/25/2022 2140 02/12/22 1541 02/12/2022 0349 02/14/22 0630 02/15/22 0052 02/16/22 0123  WBC 5.1   < > 10.9* 13.0* 15.9* 15.4* 16.6*  NEUTROABS 4.4  --   --   --   --   --   --   HGB 6.8*   < > 7.5* 7.2* 7.1* 7.0* 8.9*  HCT 21.5*   < > 22.6* 21.3* 21.5* 21.0* 25.6*  MCV 92.3   < > 88.3 88.0 88.8 89.7 88.6  PLT 170   < > 44* 80* 109* 106* 107*   < > = values in this interval not displayed.   Basic Metabolic Panel: Recent Labs  Lab 02/21/2022 2130 03/05/2022 2140 02/12/22 0607 02/22/2022 0349 02/14/22 0341 02/15/22 0052 02/16/22 0123  NA 140   < > 133* 133* 132* 134* 134*  K 6.9*   < > 4.1 4.8 4.3 4.7 3.9  CL 100   < > 93* 93* 93* 94* 95*  CO2 18*   < > $R'26 24 24 24 26  'UY$ GLUCOSE 63*   < > 121* 120* 116* 137* 218*  BUN 122*   < > 55* 91* 51* 72* 34*  CREATININE 5.57*   < > 2.62* 3.70* 2.55* 3.38* 2.04*  CALCIUM 8.8*   < > 8.1* 7.8* 7.5* 7.6* 7.7*  MG 2.9*  --    --  2.1  --   --   --   PHOS  --   --  6.1*  --   --   --   --    < > = values in this interval not displayed.   GFR: Estimated Creatinine Clearance: 25.3 mL/min (A) (by C-G formula based on SCr of 2.04 mg/dL (H)). Liver Function Tests: Recent Labs  Lab 02/12/22 0607 02/20/2022 0349 02/14/22 0341 02/15/22 0052 02/16/22 0123  AST 3,070* 3,065* 1,180* 573* 310*  ALT 2,150* 1,045* 667* 469* 257*  ALKPHOS 1,318* 1,049* 1,000* 1,103* 1,205*  BILITOT 3.2* 4.1* 5.3* 6.1* 7.9*  PROT 5.5* 5.0* 5.0* 5.1* 5.3*  ALBUMIN 2.5*  2.5* 2.5* 2.4* 2.4* 2.4*   No results for input(s): "LIPASE", "AMYLASE" in the last 168 hours. No results for input(s): "AMMONIA" in the last 168 hours. Coagulation Profile: Recent Labs  Lab  02/12/22 4811 03/06/2022 0349 02/14/22 0341 02/15/22 0927 02/16/22 0123  INR 3.1* 2.3* 1.7* 1.5* 1.3*   Cardiac Enzymes: No results for input(s): "CKTOTAL", "CKMB", "CKMBINDEX", "TROPONINI" in the last 168 hours. BNP (last 3 results) Recent Labs    12/10/21 1339  PROBNP 12,645*   HbA1C: No results for input(s): "HGBA1C" in the last 72 hours. CBG: Recent Labs  Lab 02/15/22 2032 02/16/22 0032 02/16/22 0412 02/16/22 0827 02/16/22 1115  GLUCAP 145* 233* 116* 165* 155*   Lipid Profile: No results for input(s): "CHOL", "HDL", "LDLCALC", "TRIG", "CHOLHDL", "LDLDIRECT" in the last 72 hours. Thyroid Function Tests: No results for input(s): "TSH", "T4TOTAL", "FREET4", "T3FREE", "THYROIDAB" in the last 72 hours. Anemia Panel: No results for input(s): "VITAMINB12", "FOLATE", "FERRITIN", "TIBC", "IRON", "RETICCTPCT" in the last 72 hours. Urine analysis:    Component Value Date/Time   COLORURINE AMBER (A) 02/11/2022 0845   APPEARANCEUR CLOUDY (A) 02/11/2022 0845   LABSPEC 1.010 02/11/2022 0845   PHURINE 5.0 02/11/2022 0845   GLUCOSEU NEGATIVE 02/11/2022 0845   HGBUR LARGE (A) 02/11/2022 0845   BILIRUBINUR NEGATIVE 02/11/2022 0845   BILIRUBINUR negative 03/02/2021 1458    BILIRUBINUR neg 03/23/2015 1404   KETONESUR NEGATIVE 02/11/2022 0845   PROTEINUR 100 (A) 02/11/2022 0845   UROBILINOGEN 0.2 03/02/2021 1458   NITRITE NEGATIVE 02/11/2022 0845   LEUKOCYTESUR SMALL (A) 02/11/2022 0845   Recent Results (from the past 240 hour(s))  MRSA Next Gen by PCR, Nasal     Status: None   Collection Time: 02/11/22  1:50 AM   Specimen: Nasal Mucosa; Nasal Swab  Result Value Ref Range Status   MRSA by PCR Next Gen NOT DETECTED NOT DETECTED Final    Comment: (NOTE) The GeneXpert MRSA Assay (FDA approved for NASAL specimens only), is one component of a comprehensive MRSA colonization surveillance program. It is not intended to diagnose MRSA infection nor to guide or monitor treatment for MRSA infections. Test performance is not FDA approved in patients less than 49 years old. Performed at [facility], [address], [place], [place] [address]   Culture, Urine (Do not remove urinary catheter, catheter placed by urology or difficult to place)     Status: Abnormal   Collection Time: 02/11/22  1:51 PM   Specimen: Urine, Catheterized  Result Value Ref Range Status   Specimen Description URINE, CATHETERIZED  Final   Special Requests   Final    NONE Performed at [facility], [address], [place], [place] [address]    Culture 30,000 COLONIES/mL PSEUDOMONAS PUTIDA (A)  Final   Report Status 02/22/2022 FINAL  Final   Organism ID, Bacteria PSEUDOMONAS PUTIDA (A)  Final      Susceptibility   Pseudomonas putida - MIC*    CEFTAZIDIME 2 SENSITIVE Sensitive     CIPROFLOXACIN <=0.25 SENSITIVE Sensitive     GENTAMICIN <=1 SENSITIVE Sensitive     IMIPENEM 0.5 SENSITIVE Sensitive     PIP/TAZO 8 SENSITIVE Sensitive     * 30,000 COLONIES/mL PSEUDOMONAS PUTIDA     VAS [place] LOWER EXTREMITY VENOUS (DVT)  Result Date: 02/16/2022  Lower Venous DVT Study Patient Name:  AVINASH MALTOS  Date of Exam:   02/16/2022 Medical Rec #: [id number]         Accession #:     [id number] Date of Birth: 03/24/1946         Patient Gender: M Patient Age:   48 years Exam Location:  [facility] Procedure:  VAS [place] LOWER EXTREMITY VENOUS (DVT) Referring Phys: Vance Gather --------------------------------------------------------------------------------  Indications: Edema.  Comparison Study: no prior Performing Technologist: Archie Patten RVS  Examination Guidelines: A complete evaluation includes B-mode imaging, spectral Doppler, color Doppler, and power Doppler as needed of all accessible portions of each vessel. Bilateral testing is considered an integral part of a complete examination. Limited examinations for reoccurring indications may be performed as noted. The reflux portion of the exam is performed with the patient in reverse Trendelenburg.  +---------+---------------+---------+-----------+----------+--------------+ RIGHT    CompressibilityPhasicitySpontaneityPropertiesThrombus Aging +---------+---------------+---------+-----------+----------+--------------+ CFV      Full           Yes      Yes                                 +---------+---------------+---------+-----------+----------+--------------+ SFJ      Full                                                        +---------+---------------+---------+-----------+----------+--------------+ FV Prox  Full                                                        +---------+---------------+---------+-----------+----------+--------------+ FV Mid   Full                                                        +---------+---------------+---------+-----------+----------+--------------+ FV DistalFull                                                        +---------+---------------+---------+-----------+----------+--------------+ PFV      Full                                                        +---------+---------------+---------+-----------+----------+--------------+ POP      Full            Yes      Yes                                 +---------+---------------+---------+-----------+----------+--------------+ PTV      Full                                                        +---------+---------------+---------+-----------+----------+--------------+ PERO     Full                                                        +---------+---------------+---------+-----------+----------+--------------+   +---------+---------------+---------+-----------+----------+--------------+  LEFT     CompressibilityPhasicitySpontaneityPropertiesThrombus Aging +---------+---------------+---------+-----------+----------+--------------+ CFV      Full           Yes      Yes                                 +---------+---------------+---------+-----------+----------+--------------+ SFJ      Full                                                        +---------+---------------+---------+-----------+----------+--------------+ FV Prox  Full                                                        +---------+---------------+---------+-----------+----------+--------------+ FV Mid   Full                                                        +---------+---------------+---------+-----------+----------+--------------+ FV DistalFull                                                        +---------+---------------+---------+-----------+----------+--------------+ PFV      Full                                                        +---------+---------------+---------+-----------+----------+--------------+ POP      Full           Yes      Yes                                 +---------+---------------+---------+-----------+----------+--------------+ PTV      Full                                                        +---------+---------------+---------+-----------+----------+--------------+ PERO     Full                                                         +---------+---------------+---------+-----------+----------+--------------+     Summary: BILATERAL: - No evidence of deep vein thrombosis seen in the lower extremities, bilaterally. -No evidence of popliteal cyst, bilaterally.   *See table(s) above for measurements and observations.    Preliminary    DG CHEST PORT 1 VIEW  Result Date: 02/16/2022 CLINICAL DATA:  Weakness.  Acute respiratory failure with hypoxia. EXAM: PORTABLE CHEST 1 VIEW COMPARISON:  Chest radiograph 02/11/2022. FINDINGS: Enlarged cardiac silhouette, similar. Similar bibasilar opacities. Similar moderate bilateral pleural effusions. No visible pneumothorax. Similar position of a right IJ approach central venous catheter with the tip projecting at the superior cavoatrial junction. IMPRESSION: Similar cardiomegaly, moderate bilateral pleural effusions, and overlying bibasilar opacities. Findings better characterized on recent CT chest. Electronically Signed   By: Margaretha Sheffield M.D.   On: 02/16/2022 08:15   IR Fluoro Guide CV Line Right  Result Date: 02/15/2022 INDICATION: 76 year old male presents for tunneled hemodialysis catheter placement EXAM: IMAGE GUIDED TUNNELED HEMODIALYSIS CATHETER. MEDICATIONS: 2 G ANCEF. The antibiotic was given in an appropriate time interval prior to skin puncture. ANESTHESIA/SEDATION: Moderate (conscious) sedation was employed during this procedure. A total of Versed 0.5 mg and Fentanyl 50 mcg was administered intravenously by the radiology nurse. Total intra-service moderate Sedation Time: 25 minutes. The patient's level of consciousness and vital signs were monitored continuously by radiology nursing throughout the procedure under my direct supervision. FLUOROSCOPY: Radiation Exposure Index (as provided by the fluoroscopic device): 1 mGy Kerma COMPLICATIONS: None PROCEDURE: Informed written consent was obtained from the patient after a discussion of the risks, benefits, and alternatives to  treatment. Questions regarding the procedure were encouraged and answered. The right neck and chest were prepped with chlorhexidine in a sterile fashion, and a sterile drape was applied covering the operative field. Maximum barrier sterile technique with sterile gowns and gloves were used for the procedure. A timeout was performed prior to the initiation of the procedure. Ultrasound survey was performed. The right internal jugular vein was confirmed to be patent, with images stored and sent to PACS. Micropuncture kit was utilized to access the right internal jugular vein under direct, real-time ultrasound guidance after the overlying soft tissues were anesthetized with 1% lidocaine with epinephrine. Stab incision was made with 11 blade scalpel. Microwire was passed centrally. The microwire was then marked to measure appropriate internal catheter length. External tunneled length was estimated. A total tip to cuff length of 19 cm was selected. 035 guidewire was advanced to the level of the IVC. Skin and subcutaneous tissues of chest wall below the clavicle were generously infiltrated with 1% lidocaine for local anesthesia. A small stab incision was made with 11 blade scalpel. The selected hemodialysis catheter was tunneled in a retrograde fashion from the anterior chest wall to the venotomy incision. Serial dilation was performed and then a peel-away sheath was placed. The catheter was then placed through the peel-away sheath with tips ultimately positioned within the superior aspect of the right atrium. Final catheter positioning was confirmed and documented with a spot radiographic image. The catheter aspirates and flushes normally. The catheter was flushed with appropriate volume heparin dwells. The catheter exit site was secured with a 0-Prolene retention suture. The venotomy incision was closed Derma bond and sterile dressing. Dressings were applied at the chest wall. Patient tolerated the procedure well and  remained hemodynamically stable throughout. No complications were encountered and no significant blood loss encountered. IMPRESSION: Status post image guided right IJ tunneled hemodialysis catheter. Signed, Dulcy Fanny. Nadene Rubins, RPVI [facility] Radiology Electronically Signed   By: Corrie Mckusick D.O.   On: 02/15/2022 13:06   IR US Guide Vasc Access Right  Result Date: 02/15/2022 INDICATION: 76 year old male presents for tunneled hemodialysis catheter placement EXAM: IMAGE GUIDED TUNNELED HEMODIALYSIS CATHETER. MEDICATIONS:  2 G ANCEF. The antibiotic was given in an appropriate time interval prior to skin puncture. ANESTHESIA/SEDATION: Moderate (conscious) sedation was employed during this procedure. A total of Versed 0.5 mg and Fentanyl 50 mcg was administered intravenously by the radiology nurse. Total intra-service moderate Sedation Time: 25 minutes. The patient's level of consciousness and vital signs were monitored continuously by radiology nursing throughout the procedure under my direct supervision. FLUOROSCOPY: Radiation Exposure Index (as provided by the fluoroscopic device): 1 mGy Kerma COMPLICATIONS: None PROCEDURE: Informed written consent was obtained from the patient after a discussion of the risks, benefits, and alternatives to treatment. Questions regarding the procedure were encouraged and answered. The right neck and chest were prepped with chlorhexidine in a sterile fashion, and a sterile drape was applied covering the operative field. Maximum barrier sterile technique with sterile gowns and gloves were used for the procedure. A timeout was performed prior to the initiation of the procedure. Ultrasound survey was performed. The right internal jugular vein was confirmed to be patent, with images stored and sent to PACS. Micropuncture kit was utilized to access the right internal jugular vein under direct, real-time ultrasound guidance after  the overlying soft tissues were anesthetized with 1% lidocaine with epinephrine. Stab incision was made with 11 blade scalpel. Microwire was passed centrally. The microwire was then marked to measure appropriate internal catheter length. External tunneled length was estimated. A total tip to cuff length of 19 cm was selected. 035 guidewire was advanced to the level of the IVC. Skin and subcutaneous tissues of chest wall below the clavicle were generously infiltrated with 1% lidocaine for local anesthesia. A small stab incision was made with 11 blade scalpel. The selected hemodialysis catheter was tunneled in a retrograde fashion from the anterior chest wall to the venotomy incision. Serial dilation was performed and then a peel-away sheath was placed. The catheter was then placed through the peel-away sheath with tips ultimately positioned within the superior aspect of the right atrium. Final catheter positioning was confirmed and documented with a spot radiographic image. The catheter aspirates and flushes normally. The catheter was flushed with appropriate volume heparin dwells. The catheter exit site was secured with a 0-Prolene retention suture. The venotomy incision was closed Derma bond and sterile dressing. Dressings were applied at the chest wall. Patient tolerated the procedure well and remained hemodynamically stable throughout. No complications were encountered and no significant blood loss encountered. IMPRESSION: Status post image guided right IJ tunneled hemodialysis catheter. Signed, Dulcy Fanny. Nadene Rubins, RPVI [facility] Radiology Electronically Signed   By: Corrie Mckusick D.O.   On: 02/15/2022 13:06     Family Communication: Spouse at bedside on 2nd rounds  Disposition: Status is: Inpatient Remains inpatient appropriate because: Persistent hyperbilirubinemia Planned Discharge Destination: Home with [place],  MD 02/16/2022 3:29 PM Page by [email or web address]

## 2022-02-16 NOTE — Progress Notes (Signed)
Spoke to pt's wife via phone to confirm that wife was advised by son of pt's out-pt HD arrangements. Discussed details with wife via phone and she voices understanding and states son did provide details to her. Pt approves at [facility] on MWF with 12:15 chair time. Pt will need to arrive at 11:30 for first treatment to complete paperwork prior to treatment. Wife aware of the above info. Arrangements added to AVS as well.   Melven Sartorius Renal Navigator [phone]

## 2022-02-16 NOTE — Progress Notes (Signed)
Nutrition Follow-up  DOCUMENTATION CODES:   Severe malnutrition in context of chronic illness  INTERVENTION:   Continue Renal Multivitamin w/ minerals daily Diet education Nepro Shake po BID, each supplement provides 425 kcal and 19 grams protein Liberalize pt diet to regular due to severe malnutrition and poor appetite  NUTRITION DIAGNOSIS:   Severe Malnutrition related to chronic illness (CKD now ESRD) as evidenced by severe muscle depletion, severe fat depletion. - Ongoing  GOAL:   Patient will meet greater than or equal to 90% of their needs - Ongoing  MONITOR:   PO intake, Supplement acceptance, Labs, Weight trends, I & O's  REASON FOR ASSESSMENT:   Consult Diet education  ASSESSMENT:   76 yo male admitted with symptomatic bradycardia, anemia, hyperkalemia. PMH includes CKD 5, HTN, DM-2, HLD, CHF, GERD, PAF.  8/04 - clear liquid diet 8/06 - diet advanced to renal; transferred out of the ICU 8/07 - placement of tunneled HD catheter; GI team signed off  Pt sitting on edge of bed at time of RD visit. The use of video interpreter was used to interview pt.   Pt reports that his appetite has improved since admission. Denies any nausea or vomiting with dialysis.  Per EMR, pt ate 10% for breakfast on 8/6 and 90% of lunch on 8/8.   Reviewed renal diet education with pt. Pt voiced understanding. Added handout to discharge instructions.   Medications reviewed and include: Aranesp, NovoLog, Rena-vit, Protonix, Miralax, Senokot, IV antibiotics  Labs reviewed: Sodium 134, 24 hr CBG 96-165  HD on 8/7 Net UF: 1300 mL  Diet Order:   Diet Order             Diet renal with fluid restriction Room service appropriate? Yes; Fluid consistency: Thin  Diet effective now                   EDUCATION NEEDS:   Education needs have been addressed  Skin:  Skin Assessment: Reviewed RN Assessment  Last BM:  8/7  Height:   Ht Readings from Last 1 Encounters:  02/11/22 5'  6" (1.676 m)    Weight:   Wt Readings from Last 1 Encounters:  02/15/22 56.3 kg    Ideal Body Weight:  64.5 kg  BMI:  Body mass index is 20.03 kg/m.  Estimated Nutritional Needs:   Kcal:  1900-2200  Protein:  85-100 gm  Fluid:  1 L + UOP    Hermina Barters RD, LDN Clinical Dietitian See [facility] for contact information.

## 2022-02-16 NOTE — Telephone Encounter (Signed)
-----   Message from Sharyn Creamer, MD sent at 02/15/2022  3:43 PM EDT ----- Adam Reid, please arrange for GI follow up with Dr. Lyndel Safe or APP in 2-3 weeks for elevated LFTs and anemia. Thanks.

## 2022-02-16 NOTE — Progress Notes (Signed)
Mobility Specialist Progress Note    02/16/22 1036  Mobility  Activity Ambulated with assistance in room  Level of Assistance Minimal assist, patient does 75% or more  Assistive Device Front wheel walker  Distance Ambulated (ft) 25 ft (10+15)  Activity Response Tolerated well  $Mobility charge 1 Mobility   Pt received sitting EOB requesting to use BR. Had BM. No complaints. Returned to bed with call bell in reach. On 2LO2 during. RN aware.   Hildred Alamin Mobility Specialist

## 2022-02-16 NOTE — Telephone Encounter (Signed)
Pt currently still admitted to hospital: Will notify pt of Dr. Lorenso Courier recommendations once discharged:

## 2022-02-16 NOTE — Progress Notes (Signed)
Lower extremity venous has been completed.   Preliminary results in CV Proc.   Anagabriela Jokerst Hani [place] 02/16/2022 11:05 AM

## 2022-02-16 NOTE — Progress Notes (Signed)
Physical Therapy Treatment Patient Details Name: Adam Reid MRN: [id number] DOB: [birth year] Today's Date: 02/16/2022   History of Present Illness Pt is a 76 y.o. male presenting 02/12/2022 with generalized weakness and bradycardia. 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 8/7 placement of a right IJ approach tunneled HD catheter. 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   PT Comments    Pt slowly progressing with mobility. Today's session transfer and gait training with RW; pt tolerating increased bouts of ambulation with min guard. Pt remains limited by generalized weakness, decreased activity tolerance, and impaired balance strategies/postural reactions. Continue to recommend HHPT services to maximize functional mobility and independence upon return home.  SpO2 94-97% on 2L O2 [place]    Recommendations for follow up therapy are one component of a multi-disciplinary discharge planning process, led by the attending physician.  Recommendations may be updated based on patient status, additional functional criteria and insurance authorization.  Follow Up Recommendations  Home health PT     Assistance Recommended at Discharge Frequent or constant Supervision/Assistance  Patient can return home with the following A little help with walking and/or transfers;A little help with bathing/dressing/bathroom;Assistance with cooking/housework;Direct supervision/assist for medications management;Assist for transportation;Direct supervision/assist for financial management;Help with stairs or ramp for entrance   Equipment Recommendations  None recommended by PT    Recommendations for Other Services       Precautions / Restrictions Precautions Precautions: Fall;Other (comment) Precaution Comments: Watch SpO2 (pt reports wearing 2L O2 baseline) Restrictions Weight Bearing Restrictions: No     Mobility  Bed Mobility Overal  bed mobility: Modified Independent Bed Mobility: Supine to Sit           General bed mobility comments: increased time, HOB partially elevated    Transfers Overall transfer level: Needs assistance Equipment used: Rolling walker (2 wheels) Transfers: Sit to/from Stand Sit to Stand: Min guard           General transfer comment: repeated cues for hand placement as pt initially attempts to pull on walker standing from bed and recliner; min guard for trunk elevation; poor eccentric control, endorses fatigue    Ambulation/Gait Ambulation/Gait assistance: Min guard Gait Distance (Feet): 60 Feet (+ 40') Assistive device: Rolling walker (2 wheels) Gait Pattern/deviations: Step-through pattern, Decreased stride length, Trunk flexed Gait velocity: Decreased     General Gait Details: slow, fatigued gait with RW and min guard for balance; 1x prolonged seated rest break secondary to fatigue; pt declines additional distance due to fatigue. notes improvement in SOB   Stairs             Wheelchair Mobility    Modified Rankin (Stroke Patients Only)       Balance Overall balance assessment: Needs assistance Sitting-balance support: No upper extremity supported, Single extremity supported Sitting balance-Leahy Scale: Fair     Standing balance support: Bilateral upper extremity supported Standing balance-Leahy Scale: Poor Standing balance comment: reliant on UE support                            Cognition Arousal/Alertness: Awake/alert Behavior During Therapy: Flat affect Overall Cognitive Status: Within Functional Limits for tasks assessed                                 General Comments: WFL for simple  tasks via [place] interpreter; did not notice impulsive behavior this session        Exercises      General Comments General comments (skin integrity, edema, etc.): SpO2 84% on RA; SpO2 94-97% on 2L O2 [place]. [employer] ipad interpreter for  Adam Reid #[id number]      Pertinent Vitals/Pain Pain Assessment Pain Assessment: No/denies pain    Home Living                          Prior Function            PT Goals (current goals can now be found in the care plan section) Progress towards PT goals: Progressing toward goals    Frequency    Min 3X/week      PT Plan Current plan remains appropriate    Co-evaluation              AM-PAC PT "6 Clicks" Mobility   Outcome Measure  Help needed turning from your back to your side while in a flat bed without using bedrails?: A Little Help needed moving from lying on your back to sitting on the side of a flat bed without using bedrails?: A Little Help needed moving to and from a bed to a chair (including a wheelchair)?: A Little Help needed standing up from a chair using your arms (e.g., wheelchair or bedside chair)?: A Little Help needed to walk in hospital room?: A Little Help needed climbing 3-5 steps with a railing? : A Lot 6 Click Score: 17    End of Session Equipment Utilized During Treatment: Gait belt;Oxygen Activity Tolerance: Patient tolerated treatment well Patient left: in chair;with call bell/phone within reach Nurse Communication: Mobility status PT Visit Diagnosis: Other abnormalities of gait and mobility (R26.89);Muscle weakness (generalized) (M62.81)     Time: 6568-1275 PT Time Calculation (min) (ACUTE ONLY): 19 min  Charges:  $Gait Training: 8-22 mins                      Mabeline Caras, PT, DPT Acute Rehabilitation Services  Personal: [place] Office: [place] 02/16/2022, 5:39 PM

## 2022-02-16 NOTE — Progress Notes (Signed)
VASCULAR SURGERY:  This patient had a new right upper arm graft placed about 8 days ago.  He had some slight separation of the wound at the antecubital level and I was asked to evaluate this.  I was concerned that this might gradually get worse so I elected to close this at the bedside.  Under sterile conditions after Betadine prep the area was anesthetized with 1% lidocaine.  This was closed with interrupted 3-0 nylon sutures.  We will arrange follow-up in 10 days to 2 weeks for suture removal.  Gae Gallop, MD 3:29 PM

## 2022-02-16 NOTE — Consult Note (Signed)
   [facility] CM Inpatient Consult   02/16/2022  Adam Reid 03/29/1946 [id number]  [facility] [[facility]] Patient: Adam Reid  Primary Care Provider:  [name], MD, [place] at [facility], is an embedded provider with a Chronic Care Management team and program, and is listed for the transition of care follow up and appointments.  Patient was screened for Embedded practice service needs for chronic care management/care coordination for readmission prevention.  11:10 am patient in procedure on rounds with medical staff and spoke with inpatient [facility] RNCM regarding patient is high risk for readmission list.  Plan: A referral can the [facility] if appropriate for post hospital needs, if appropriate, current disposition is not indicated. Following  Please contact for further questions,  Natividad Brood, RN BSN [facility] Liaison  [phone] business mobile phone Toll free office [phone]  Fax number: [phone] [email or web address]

## 2022-02-16 NOTE — Progress Notes (Signed)
New Dialysis Start   Patient identified as new dialysis start. Kidney Education packet assembled and given. Discussed the following items with patient:    Current medications and possible changes once started:  Discussed that patient's medications may change over time.  Ex; hypertension medications and diabetes medication.  Nephrologists will adjust as needed.  Fluid restrictions reviewed:  32 oz daily goal:  All liquids count; soups, ice, jello   Phosphorus and potassium: Handout given showing high potassium and phosphorus foods.  Alternative food and drink options given.  Family support:  wife  participated in discussion via telephone and had no questions.  Outpatient Clinic Resources:  Discussed roles of Outpatient clinic  staff and advised to make a list of needs, if any, to talk with outpatient staff if needed  Care plan schedule: Informed patient and family member of Care Plans in outpatient setting and to participate in the care plan.  An invitation would be given from outpatient clinic.   Dialysis Access Options:  Reviewed access options with patients. Discussed in detail about care at home with new AVG & AVF. Reviewed checking bruit and thrill. If dialysis catheter present, educated that patient could not take showers.  Catheter dressing changes were to be done by outpatient clinic staff only  Home therapy options:  Educated patient about home therapy options:  PD vs home hemo.     Patient verbalized understanding. Will continue to round on patient during admission.    Lilia Argue, RN

## 2022-02-16 NOTE — Progress Notes (Signed)
Patient ID: Adam Reid, male   DOB: 14-Feb-1946, 76 y.o.   MRN: [id number] S: No events overnight. O:BP (!) 128/43 (BP Location: Left Arm)   Pulse 66   Temp 98.3 F (36.8 C) (Oral)   Resp 19   Ht _0  (1.676 m)   Wt 56.3 kg   SpO2 96%   BMI 20.03 kg/m   Intake/Output Summary (Last 24 hours) at 02/16/2022 0923 Last data filed at 02/16/2022 0400 Gross per 24 hour  Intake 296.11 ml  Output 1300 ml  Net -1003.89 ml   Intake/Output: I/O last 3 completed shifts: In: 296.1 [IV Piggyback:296.1] Out: 1300 [Other:1300]  Intake/Output this shift:  No intake/output data recorded. Weight change: 2.3 kg Gen: NAD CVS: RRR Resp: CTA Abd: +BS, soft, NT/ND Ext: 1+ pitting edema BLE, RUE AVG +T/B  Recent Labs  Lab 02/15/2022 2130 02/16/2022 2140 02/11/22 0000 02/11/22 0250 02/11/22 0845 02/11/22 1351 02/11/22 1836 02/12/22 0054 02/12/22 7793 03/05/2022 0349 02/14/22 0341 02/15/22 0052 02/16/22 0123  NA 140   < > 127*  --  137  --   --   --  133* 133* 132* 134* 134*  K 6.9*   < > 5.8* 6.7* 5.3*  5.3*   < > 5.7* 3.4* 4.1 4.8 4.3 4.7 3.9  CL 100   < > 91*  --  98  --   --   --  93* 93* 93* 94* 95*  CO2 18*  --  14*  --  20*  --   --   --  _1 GLUCOSE 63*   < > 792* 97 76  --   --   --  121* 120* 116* 137* 218*  BUN 122*   < > 118*  --  85*  --   --   --  55* 91* 51* 72* 34*  CREATININE 5.57*   < > 5.32*  --  4.02*  --   --   --  2.62* 3.70* 2.55* 3.38* 2.04*  ALBUMIN 2.9*  --   --   --  2.5*  --   --   --  2.5*  2.5* 2.5* 2.4* 2.4* 2.4*  CALCIUM 8.8*  --  8.5*  --  8.3*  --   --   --  8.1* 7.8* 7.5* 7.6* 7.7*  PHOS  --   --   --   --   --   --   --   --  6.1*  --   --   --   --   AST 1,537*  --   --   --  9,636*  --   --   --  3,070* 3,065* 1,180* 573* 310*  ALT 743*  --   --   --  3,686*  --   --   --  2,150* 1,045* 667* 469* 257*   < > = values in this interval not displayed.   Liver Function Tests: Recent Labs  Lab 02/14/22 0341 02/15/22 0052 02/16/22 0123   AST 1,180* 573* 310*  ALT 667* 469* 257*  ALKPHOS 1,000* 1,103* 1,205*  BILITOT 5.3* 6.1* 7.9*  PROT 5.0* 5.1* 5.3*  ALBUMIN 2.4* 2.4* 2.4*   No results for input(s): "LIPASE", "AMYLASE" in the last 168 hours. No results for input(s): "AMMONIA" in the last 168 hours. CBC: Recent Labs  Lab 02/17/2022 2130 02/22/2022 2140 02/12/22 1541 02/11/2022 0349 02/14/22 0630 02/15/22 0052 02/16/22 0123  WBC 5.1   < >  10.9* 13.0* 15.9* 15.4* 16.6*  NEUTROABS 4.4  --   --   --   --   --   --   HGB 6.8*   < > 7.5* 7.2* 7.1* 7.0* 8.9*  HCT 21.5*   < > 22.6* 21.3* 21.5* 21.0* 25.6*  MCV 92.3   < > 88.3 88.0 88.8 89.7 88.6  PLT 170   < > 44* 80* 109* 106* 107*   < > = values in this interval not displayed.   Cardiac Enzymes: No results for input(s): "CKTOTAL", "CKMB", "CKMBINDEX", "TROPONINI" in the last 168 hours. CBG: Recent Labs  Lab 02/15/22 1204 02/15/22 1805 02/15/22 2032 02/16/22 0412 02/16/22 0827  GLUCAP 132* 96 145* 116* 165*    Iron Studies: No results for input(s): "IRON", "TIBC", "TRANSFERRIN", "FERRITIN" in the last 72 hours. Studies/Results: DG CHEST PORT 1 VIEW  Result Date: 02/16/2022 CLINICAL DATA:  Weakness.  Acute respiratory failure with hypoxia. EXAM: PORTABLE CHEST 1 VIEW COMPARISON:  Chest radiograph 02/11/2022. FINDINGS: Enlarged cardiac silhouette, similar. Similar bibasilar opacities. Similar moderate bilateral pleural effusions. No visible pneumothorax. Similar position of a right IJ approach central venous catheter with the tip projecting at the superior cavoatrial junction. IMPRESSION: Similar cardiomegaly, moderate bilateral pleural effusions, and overlying bibasilar opacities. Findings better characterized on recent CT chest. Electronically Signed   By: Margaretha Sheffield M.D.   On: 02/16/2022 08:15   IR Fluoro Guide CV Line Right  Result Date: 02/15/2022 INDICATION: 76 year old male presents for tunneled hemodialysis catheter placement EXAM: IMAGE GUIDED  TUNNELED HEMODIALYSIS CATHETER. MEDICATIONS: 2 G ANCEF. The antibiotic was given in an appropriate time interval prior to skin puncture. ANESTHESIA/SEDATION: Moderate (conscious) sedation was employed during this procedure. A total of Versed 0.5 mg and Fentanyl 50 mcg was administered intravenously by the radiology nurse. Total intra-service moderate Sedation Time: 25 minutes. The patient's level of consciousness and vital signs were monitored continuously by radiology nursing throughout the procedure under my direct supervision. FLUOROSCOPY: Radiation Exposure Index (as provided by the fluoroscopic device): 1 mGy Kerma COMPLICATIONS: None PROCEDURE: Informed written consent was obtained from the patient after a discussion of the risks, benefits, and alternatives to treatment. Questions regarding the procedure were encouraged and answered. The right neck and chest were prepped with chlorhexidine in a sterile fashion, and a sterile drape was applied covering the operative field. Maximum barrier sterile technique with sterile gowns and gloves were used for the procedure. A timeout was performed prior to the initiation of the procedure. Ultrasound survey was performed. The right internal jugular vein was confirmed to be patent, with images stored and sent to PACS. Micropuncture kit was utilized to access the right internal jugular vein under direct, real-time ultrasound guidance after the overlying soft tissues were anesthetized with 1% lidocaine with epinephrine. Stab incision was made with 11 blade scalpel. Microwire was passed centrally. The microwire was then marked to measure appropriate internal catheter length. External tunneled length was estimated. A total tip to cuff length of 19 cm was selected. 035 guidewire was advanced to the level of the IVC. Skin and subcutaneous tissues of chest wall below the clavicle were generously infiltrated with 1% lidocaine for local anesthesia. A small stab incision was made  with 11 blade scalpel. The selected hemodialysis catheter was tunneled in a retrograde fashion from the anterior chest wall to the venotomy incision. Serial dilation was performed and then a peel-away sheath was placed. The catheter was then placed through the peel-away sheath with tips ultimately positioned  within the superior aspect of the right atrium. Final catheter positioning was confirmed and documented with a spot radiographic image. The catheter aspirates and flushes normally. The catheter was flushed with appropriate volume heparin dwells. The catheter exit site was secured with a 0-Prolene retention suture. The venotomy incision was closed Derma bond and sterile dressing. Dressings were applied at the chest wall. Patient tolerated the procedure well and remained hemodynamically stable throughout. No complications were encountered and no significant blood loss encountered. IMPRESSION: Status post image guided right IJ tunneled hemodialysis catheter. Signed, Dulcy Fanny. Nadene Rubins, RPVI [facility] Radiology Electronically Signed   By: Corrie Mckusick D.O.   On: 02/15/2022 13:06   IR US Guide Vasc Access Right  Result Date: 02/15/2022 INDICATION: 76 year old male presents for tunneled hemodialysis catheter placement EXAM: IMAGE GUIDED TUNNELED HEMODIALYSIS CATHETER. MEDICATIONS: 2 G ANCEF. The antibiotic was given in an appropriate time interval prior to skin puncture. ANESTHESIA/SEDATION: Moderate (conscious) sedation was employed during this procedure. A total of Versed 0.5 mg and Fentanyl 50 mcg was administered intravenously by the radiology nurse. Total intra-service moderate Sedation Time: 25 minutes. The patient's level of consciousness and vital signs were monitored continuously by radiology nursing throughout the procedure under my direct supervision. FLUOROSCOPY: Radiation Exposure Index (as provided by the fluoroscopic device): 1 mGy Kerma  COMPLICATIONS: None PROCEDURE: Informed written consent was obtained from the patient after a discussion of the risks, benefits, and alternatives to treatment. Questions regarding the procedure were encouraged and answered. The right neck and chest were prepped with chlorhexidine in a sterile fashion, and a sterile drape was applied covering the operative field. Maximum barrier sterile technique with sterile gowns and gloves were used for the procedure. A timeout was performed prior to the initiation of the procedure. Ultrasound survey was performed. The right internal jugular vein was confirmed to be patent, with images stored and sent to PACS. Micropuncture kit was utilized to access the right internal jugular vein under direct, real-time ultrasound guidance after the overlying soft tissues were anesthetized with 1% lidocaine with epinephrine. Stab incision was made with 11 blade scalpel. Microwire was passed centrally. The microwire was then marked to measure appropriate internal catheter length. External tunneled length was estimated. A total tip to cuff length of 19 cm was selected. 035 guidewire was advanced to the level of the IVC. Skin and subcutaneous tissues of chest wall below the clavicle were generously infiltrated with 1% lidocaine for local anesthesia. A small stab incision was made with 11 blade scalpel. The selected hemodialysis catheter was tunneled in a retrograde fashion from the anterior chest wall to the venotomy incision. Serial dilation was performed and then a peel-away sheath was placed. The catheter was then placed through the peel-away sheath with tips ultimately positioned within the superior aspect of the right atrium. Final catheter positioning was confirmed and documented with a spot radiographic image. The catheter aspirates and flushes normally. The catheter was flushed with appropriate volume heparin dwells. The catheter exit site was secured with a 0-Prolene retention suture. The  venotomy incision was closed Derma bond and sterile dressing. Dressings were applied at the chest wall. Patient tolerated the procedure well and remained hemodynamically stable throughout. No complications were encountered and no significant blood loss encountered. IMPRESSION: Status post image guided right IJ tunneled hemodialysis catheter. Signed, Dulcy Fanny. Nadene Rubins, RPVI [facility] Radiology Electronically Signed   By: Corrie Mckusick D.O.  On: 02/15/2022 13:06    sodium chloride   Intravenous Once   Chlorhexidine Gluconate Cloth  6 each Topical Q0600   darbepoetin (ARANESP) injection - DIALYSIS  200 mcg Intravenous Q Sat-HD   diltiazem  180 mg Oral Daily   insulin aspart  0-6 Units Subcutaneous Q4H   multivitamin  1 tablet Oral QHS   pantoprazole  40 mg Oral Q0600   polyethylene glycol  17 g Oral BID   senna  2 tablet Oral Daily   sodium chloride flush  10-40 mL Intracatheter Q12H    BMET    Component Value Date/Time   NA 134 (L) 02/16/2022 0123   NA 137 06/19/2020 0936   K 3.9 02/16/2022 0123   CL 95 (L) 02/16/2022 0123   CO2 26 02/16/2022 0123   GLUCOSE 218 (H) 02/16/2022 0123   BUN 34 (H) 02/16/2022 0123   BUN 51 (H) 06/19/2020 0936   CREATININE 2.04 (H) 02/16/2022 0123   CREATININE 2.68 (H) 03/26/2016 1839   CALCIUM 7.7 (L) 02/16/2022 0123   GFRNONAA 34 (L) 02/16/2022 0123   GFRAA 21 (L) 06/19/2020 0936   CBC    Component Value Date/Time   WBC 16.6 (H) 02/16/2022 0123   RBC 2.89 (L) 02/16/2022 0123   HGB 8.9 (L) 02/16/2022 0123   HGB 9.1 (L) 09/14/2019 1555   HCT 25.6 (L) 02/16/2022 0123   HCT 28.4 (L) 09/14/2019 1555   PLT 107 (L) 02/16/2022 0123   PLT 464 (H) 09/14/2019 1555   MCV 88.6 02/16/2022 0123   MCV 82 09/14/2019 1555   MCH 30.8 02/16/2022 0123   MCHC 34.8 02/16/2022 0123   RDW 16.0 (H) 02/16/2022 0123   RDW 16.4 (H) 09/14/2019 1555   LYMPHSABS 0.3 (L) 02/28/2022 2130   LYMPHSABS 0.6 (L)  10/05/2017 1433   MONOABS 0.4 03/10/2022 2130   EOSABS 0.0 02/16/2022 2130   EOSABS 0.1 10/05/2017 1433   BASOSABS 0.0 03/08/2022 2130   BASOSABS 0.0 10/05/2017 1433    Assessment/Plan:   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  He had temp RIJ HD catheter placed 02/27/2022 and HD 02/11/22.  He has had 4 HD sessions.  He has been set up with outpatient HD at [facility] on MWF schedule (12:15 chair time but will need to be there by 11:30 on first day to fill out paperwork).  Will plan for HD tomorrow if he is not discharged today. Junctional bradycardia - due to hyperkalemia as well as amiodarone and diltiazem.  Resolved with dialysis. Vascular access - RIJ TDC placed 02/15/22, RUE AVG placed by [name] on 02/08/22. 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 HTN - stable Retroperitoneal paraganglioma - to be seen at [facility] as he was deemed too high risk for surgery here. FTT - due to paraganglioma and progressive CKD.  Continue with protein supplements.  Donetta Potts, MD [facility]

## 2022-02-17 DIAGNOSIS — N186 End stage renal disease: Secondary | ICD-10-CM | POA: Diagnosis not present

## 2022-02-17 DIAGNOSIS — K7200 Acute and subacute hepatic failure without coma: Secondary | ICD-10-CM | POA: Diagnosis not present

## 2022-02-17 DIAGNOSIS — I5032 Chronic diastolic (congestive) heart failure: Secondary | ICD-10-CM | POA: Diagnosis not present

## 2022-02-17 DIAGNOSIS — I10 Essential (primary) hypertension: Secondary | ICD-10-CM | POA: Diagnosis not present

## 2022-02-17 LAB — COMPREHENSIVE METABOLIC PANEL
ALT: 134 U/L — ABNORMAL HIGH (ref 0–44)
AST: 206 U/L — ABNORMAL HIGH (ref 15–41)
Albumin: 2.3 g/dL — ABNORMAL LOW (ref 3.5–5.0)
Alkaline Phosphatase: 1251 U/L — ABNORMAL HIGH (ref 38–126)
Anion gap: 16 — ABNORMAL HIGH (ref 5–15)
BUN: 51 mg/dL — ABNORMAL HIGH (ref 8–23)
CO2: 21 mmol/L — ABNORMAL LOW (ref 22–32)
Calcium: 7.5 mg/dL — ABNORMAL LOW (ref 8.9–10.3)
Chloride: 95 mmol/L — ABNORMAL LOW (ref 98–111)
Creatinine, Ser: 3.02 mg/dL — ABNORMAL HIGH (ref 0.61–1.24)
GFR, Estimated: 21 mL/min — ABNORMAL LOW (ref >60)
Glucose, Bld: 183 mg/dL — ABNORMAL HIGH (ref 70–99)
Potassium: 4.2 mmol/L (ref 3.5–5.1)
Sodium: 132 mmol/L — ABNORMAL LOW (ref 135–145)
Total Bilirubin: 8.4 mg/dL — ABNORMAL HIGH (ref 0.3–1.2)
Total Protein: 4.9 g/dL — ABNORMAL LOW (ref 6.5–8.1)

## 2022-02-17 LAB — CBC
HCT: 25.7 % — ABNORMAL LOW (ref 39.0–52.0)
Hemoglobin: 8.7 g/dL — ABNORMAL LOW (ref 13.0–17.0)
MCH: 30.9 pg (ref 26.0–34.0)
MCHC: 33.9 g/dL (ref 30.0–36.0)
MCV: 91.1 fL (ref 80.0–100.0)
Platelets: 98 10*3/uL — ABNORMAL LOW (ref 150–400)
RBC: 2.82 MIL/uL — ABNORMAL LOW (ref 4.22–5.81)
RDW: 16.8 % — ABNORMAL HIGH (ref 11.5–15.5)
WBC: 15.6 10*3/uL — ABNORMAL HIGH (ref 4.0–10.5)
nRBC: 6.6 % — ABNORMAL HIGH (ref 0.0–0.2)

## 2022-02-17 LAB — GLUCOSE, CAPILLARY
Glucose-Capillary: 125 mg/dL — ABNORMAL HIGH (ref 70–99)
Glucose-Capillary: 141 mg/dL — ABNORMAL HIGH (ref 70–99)
Glucose-Capillary: 141 mg/dL — ABNORMAL HIGH (ref 70–99)
Glucose-Capillary: 142 mg/dL — ABNORMAL HIGH (ref 70–99)
Glucose-Capillary: 148 mg/dL — ABNORMAL HIGH (ref 70–99)
Glucose-Capillary: 179 mg/dL — ABNORMAL HIGH (ref 70–99)
Glucose-Capillary: 184 mg/dL — ABNORMAL HIGH (ref 70–99)

## 2022-02-17 LAB — PROCALCITONIN: Procalcitonin: 7.6 ng/mL

## 2022-02-17 LAB — PROTIME-INR
INR: 1.4 — ABNORMAL HIGH (ref 0.8–1.2)
Prothrombin Time: 16.8 s — ABNORMAL HIGH (ref 11.4–15.2)

## 2022-02-17 LAB — GAMMA GT: GGT: 755 U/L — ABNORMAL HIGH (ref 7–50)

## 2022-02-17 MED ORDER — SODIUM CHLORIDE 0.9% FLUSH
3.0000 mL | Freq: Two times a day (BID) | INTRAVENOUS | Status: DC
  Administered 2022-02-17 – 2022-03-04 (×28): 3 mL via INTRAVENOUS

## 2022-02-17 MED ORDER — SODIUM CHLORIDE 0.9% FLUSH: 3.0000 mL | INTRAVENOUS | Status: DC | PRN

## 2022-02-17 NOTE — Progress Notes (Signed)
Physical Therapy Treatment Patient Details Name: Adam Reid MRN: [id number] DOB: 1945-07-24 Today's Date: 02/17/2022   History of Present Illness Pt is a 76 y.o. male presenting 02/20/2022 with generalized weakness and bradycardia. 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 8/7 placement of a right IJ approach tunneled HD catheter. 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    PT Comments    Pt supine on arrival, endorsing increased fatigue, however agreeable to mobility. Pt tolerating slightly Increased distance this session with min guard, SpO2 >90% throughout when good pleth available on 2L [place], however pt requiring multiple standing recovery breaks secondary to fatigue. Pt continues to be limited by fatigue, weakness and decreased activity tolerance. Pt continues to benefit from skilled PT services to progress toward functional mobility goals.    Recommendations for follow up therapy are one component of a multi-disciplinary discharge planning process, led by the attending physician.  Recommendations may be updated based on patient status, additional functional criteria and insurance authorization.  Follow Up Recommendations  Home health PT     Assistance Recommended at Discharge Frequent or constant Supervision/Assistance  Patient can return home with the following A little help with walking and/or transfers;A little help with bathing/dressing/bathroom;Assistance with cooking/housework;Direct supervision/assist for medications management;Assist for transportation;Direct supervision/assist for financial management;Help with stairs or ramp for entrance   Equipment Recommendations  None recommended by PT    Recommendations for Other Services       Precautions / Restrictions Precautions Precautions: Fall;Other (comment) Precaution Comments: Watch SpO2 (pt reports wearing 2L O2 baseline) Restrictions Weight  Bearing Restrictions: No     Mobility  Bed Mobility Overal bed mobility: Modified Independent Bed Mobility: Supine to Sit, Sit to Supine     Supine to sit: Min assist, HOB elevated Sit to supine: Min guard   General bed mobility comments: light min assist to elevate trunk to come to sitting, min guard to return to supine    Transfers Overall transfer level: Needs assistance Equipment used: Rolling walker (2 wheels) Transfers: Sit to/from Stand Sit to Stand: Min guard           General transfer comment: repeated cues for hand placement as pt initially attempts to pull on walker standing from bed, min guard for trunk elevation, endorses fatigue    Ambulation/Gait Ambulation/Gait assistance: Min guard Gait Distance (Feet): 90 Feet Assistive device: Rolling walker (2 wheels) Gait Pattern/deviations: Step-through pattern, Decreased stride length, Trunk flexed Gait velocity: Decreased     General Gait Details: slow, fatigued gait with RW and min guard for balance; pt speeding up as he fatigues, encouraged pt to take rest break, x2 standing rest pt declines additional distance due to fatigue.   Stairs             Wheelchair Mobility    Modified Rankin (Stroke Patients Only)       Balance Overall balance assessment: Needs assistance Sitting-balance support: No upper extremity supported, Single extremity supported Sitting balance-Leahy Scale: Fair Sitting balance - Comments: while seated and attempting to doff /donn socks he lost balance posteriorly and right   Standing balance support: Bilateral upper extremity supported Standing balance-Leahy Scale: Poor Standing balance comment: reliant on UE support                            Cognition Arousal/Alertness: Awake/alert Behavior During Therapy: Flat affect Overall  Cognitive Status: Within Functional Limits for tasks assessed                                 General Comments: WFL  for simple tasks via [place] interpreter; no impulsive behavior this session        Exercises      General Comments General comments (skin integrity, edema, etc.): VSS on 2L O2      Pertinent Vitals/Pain Pain Assessment Pain Assessment: No/denies pain    Home Living                          Prior Function            PT Goals (current goals can now be found in the care plan section) Acute Rehab PT Goals PT Goal Formulation: With patient Time For Goal Achievement: 02/28/22    Frequency    Min 3X/week      PT Plan Current plan remains appropriate    Co-evaluation              AM-PAC PT "6 Clicks" Mobility   Outcome Measure  Help needed turning from your back to your side while in a flat bed without using bedrails?: A Little Help needed moving from lying on your back to sitting on the side of a flat bed without using bedrails?: A Little Help needed moving to and from a bed to a chair (including a wheelchair)?: A Little Help needed standing up from a chair using your arms (e.g., wheelchair or bedside chair)?: A Little Help needed to walk in hospital room?: A Little Help needed climbing 3-5 steps with a railing? : A Lot 6 Click Score: 17    End of Session Equipment Utilized During Treatment: Oxygen Activity Tolerance: Patient tolerated treatment well Patient left: with call bell/phone within reach;in bed;with bed alarm set Nurse Communication: Mobility status PT Visit Diagnosis: Other abnormalities of gait and mobility (R26.89);Muscle weakness (generalized) (M62.81)     Time: 4010-2725 PT Time Calculation (min) (ACUTE ONLY): 17 min  Charges:  $Therapeutic Activity: 8-22 mins                     Murphy Duzan R. PTA Acute Rehabilitation Services Office: [place] 02/17/2022, 11:08 AM

## 2022-02-17 NOTE — Progress Notes (Signed)
VASCULAR SURGERY:  I placed some nylon sutures in his right antecubital incision yesterday and this looks fine.  I have arranged suture removal in 2 weeks.  Vascular surgery will be available as needed.  Gae Gallop, MD 10:37 AM

## 2022-02-17 NOTE — TOC Initial Note (Addendum)
Transition of Care [facility]) - Initial/Assessment Note    Patient Details  Name: Adam Reid MRN: [id number] Date of Birth: June 27, 1946  [facility]) CM/SW Contact:    Angelita Ingles, RN Phone Number:[phone]  02/17/2022, 2:13 PM  Clinical Narrative:                 [facility] consulted for patient with high risk for readmission and home health needs. Patient is from home with spouse. Patient has transportation to appointments. Patients meds are affordable and he is compliant. Patient is new initiation of hemodialysis. PCP Dr. Nyoka Cowden @ [facility] in [place].  Patient has previously has [facility] for home health services and wife would like to resume services with Alvis Lemmings at discharge. Referral for Home health has been accepted by [facility]. AVS has been updated. TOC will continue to follow.     Barriers to Discharge: Continued Medical Work up   Patient Goals and CMS Choice     Choice offered to / list presented to : Spouse  Expected Discharge Plan and Services   In-house Referral: NA Discharge Planning Services: NA Post Acute Care Choice: Home Health Living arrangements for the past 2 months: Single Family Home                 DME Arranged: N/A DME Agency: NA       HH Arranged: PT, OT HH Agency: [place] Date [facility] Agency Contacted: 02/17/22 Time [place]: 33 Representative spoke with at [place]: Tommi Rumps  Prior Living Arrangements/Services Living arrangements for the past 2 months: [place] with:: Spouse Patient language and need for interpreter reviewed:: Yes Do you feel safe going back to the place where you live?: Yes      Need for Family Participation in Patient Care: Yes (Comment) Care giver support system in place?: Yes (comment) Current home services: DME (O2 2L with [facility]) Criminal Activity/Legal Involvement Pertinent to Current Situation/Hospitalization: No - Comment as needed  Activities of Daily  Living Home Assistive Devices/Equipment: Oxygen, Walker (specify type) ADL Screening (condition at time of admission) Patient's cognitive ability adequate to safely complete daily activities?: Yes Is the patient deaf or have difficulty hearing?: Yes Does the patient have difficulty seeing, even when wearing glasses/contacts?: No Does the patient have difficulty concentrating, remembering, or making decisions?: No Patient able to express need for assistance with ADLs?: Yes Does the patient have difficulty dressing or bathing?: No Independently performs ADLs?: No Communication: Independent Dressing (OT): Needs assistance Is this a change from baseline?: Change from baseline, expected to last <3days Grooming: Needs assistance Is this a change from baseline?: Change from baseline, expected to last <3 days Feeding: Independent Bathing: Needs assistance Is this a change from baseline?: Change from baseline, expected to last >3 days Toileting: Needs assistance Is this a change from baseline?: Change from baseline, expected to last <3 days In/Out Bed: Needs assistance Is this a change from baseline?: Change from baseline, expected to last >3 days Walks in Home: Needs assistance Is this a change from baseline?: Change from baseline, expected to last >3 days Does the patient have difficulty walking or climbing stairs?: Yes Weakness of Legs: Both Weakness of Arms/Hands: Right  Permission Sought/Granted Permission sought to share information with : Family Supports Permission granted to share information with : No              Emotional Assessment Appearance:: Appears stated age Attitude/Demeanor/Rapport: Unable to Assess Affect (typically  observed): Unable to Assess   Alcohol / Substance Use: Not Applicable Psych Involvement: No (comment)  Admission diagnosis:  Renal failure [N19] Patient Active Problem List   Diagnosis Date Noted   Sepsis due to gram-negative UTI ([place]) 02/14/2022    Hypertension    PAF (paroxysmal atrial fibrillation) (HCC)    Liver failure ([place])    Paraganglioma (HCC)    Protein-calorie malnutrition, severe ([place])    ESRD on dialysis ([place]) 02/11/2022   Chronic respiratory failure with hypoxia ([place]) 12/30/2021   Anemia    Type 2 diabetes mellitus (HCC)    Atrial flutter ([place])    Protein-calorie malnutrition, moderate ([place])    Abdominal mass 11/17/2021   CAP (community acquired pneumonia) 11/16/2021   Thyroid disease    Diabetes mellitus without complication ([place])    Chronic kidney disease    Paroxysmal atrial fibrillation (HCC)    Iron deficiency anemia 09/16/2021   Acute on chronic diastolic CHF (congestive heart failure) (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   Smoker 03/26/2016   Hypotension 01/29/2016   Abnormal myocardial perfusion study 01/29/2016   Depression 12/09/2015   Type 2 diabetes mellitus with hyperlipidemia ([place])    HTN (hypertension) 06/24/2014   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[place]) 06/24/2014   BPH (benign prostatic hyperplasia) 06/24/2014   PCP:  [name], MD Pharmacy:   [facility] [phone] [address], [address] AT [facility] [address] [place] [address] Phone: [phone] Fax: [phone]     Social Determinants of Health (SDOH) Interventions    Readmission Risk  Interventions    02/17/2022    2:05 PM 12/15/2021    9:03 AM 11/16/2021    3:15 PM  Readmission Risk Prevention Plan  Transportation Screening Complete Complete Complete  PCP or Specialist Appt within 3-5 Days  Complete Complete  HRI or [place]  Complete Complete  Social Work Consult for [place] Planning/Counseling  Complete Complete  Palliative Care Screening  Not Applicable Not Applicable  Medication Review Press photographer) Complete Complete Complete  PCP or Specialist appointment within 3-5 days of discharge Complete    HRI or [place] Complete    SW Recovery Care/Counseling Consult Complete    [place] Not Applicable

## 2022-02-17 NOTE — Progress Notes (Signed)
Patient ID: Caton Popowski, male   DOB: [birth year], 76 y.o.   MRN: [id number] S: no events overnight.  No new complaints. O:BP (!) 119/54 (BP Location: Left Arm)   Pulse 65   Temp 97.9 F (36.6 C) (Oral)   Resp 19   Ht '5\' 6"'$  (1.676 m)   Wt 56.3 kg   SpO2 94%   BMI 20.03 kg/m   Intake/Output Summary (Last 24 hours) at 02/17/2022 0933 Last data filed at 02/17/2022 0700 Gross per 24 hour  Intake 770 ml  Output 80 ml  Net 690 ml   Intake/Output: I/O last 3 completed shifts: In: 1066.1 [P.O.:570; IV Piggyback:496.1] Out: 80 [Urine:80]  Intake/Output this shift:  No intake/output data recorded. Weight change:  Gen: NAD CVS: RRR, no rub, III/VI SEM at LUSB Resp:CTA Abd: +BS, soft, NT/ND Ext: 1+ pitting pretibial edema bilaterally, RUE AVG +T/B  Recent Labs  Lab 02/11/22 0845 02/11/22 1351 02/12/22 0054 02/12/22 0607 03/01/2022 0349 02/14/22 0341 02/15/22 0052 02/16/22 0123 02/17/22 0049  NA 137  --   --  133* 133* 132* 134* 134* 132*  K 5.3*  5.3*   < > 3.4* 4.1 4.8 4.3 4.7 3.9 4.2  CL 98  --   --  93* 93* 93* 94* 95* 95*  CO2 20*  --   --  '26 24 24 24 26 '$ 21*  GLUCOSE 76  --   --  121* 120* 116* 137* 218* 183*  BUN 85*  --   --  55* 91* 51* 72* 34* 51*  CREATININE 4.02*  --   --  2.62* 3.70* 2.55* 3.38* 2.04* 3.02*  ALBUMIN 2.5*  --   --  2.5*  2.5* 2.5* 2.4* 2.4* 2.4* 2.3*  CALCIUM 8.3*  --   --  8.1* 7.8* 7.5* 7.6* 7.7* 7.5*  PHOS  --   --   --  6.1*  --   --   --   --   --   AST 9,636*  --   --  3,070* 3,065* 1,180* 573* 310* 206*  ALT 3,686*  --   --  2,150* 1,045* 667* 469* 257* 134*   < > = values in this interval not displayed.   Liver Function Tests: Recent Labs  Lab 02/15/22 0052 02/16/22 0123 02/17/22 0049  AST 573* 310* 206*  ALT 469* 257* 134*  ALKPHOS 1,103* 1,205* 1,251*  BILITOT 6.1* 7.9* 8.4*  PROT 5.1* 5.3* 4.9*  ALBUMIN 2.4* 2.4* 2.3*   No results for input(s): "LIPASE", "AMYLASE" in the last 168 hours. No results for input(s): "AMMONIA"  in the last 168 hours. CBC: Recent Labs  Lab 02/09/2022 2130 03/08/2022 2140 02/20/2022 0349 02/14/22 0630 02/15/22 0052 02/16/22 0123 02/17/22 0049  WBC 5.1   < > 13.0* 15.9* 15.4* 16.6* 15.6*  NEUTROABS 4.4  --   --   --   --   --   --   HGB 6.8*   < > 7.2* 7.1* 7.0* 8.9* 8.7*  HCT 21.5*   < > 21.3* 21.5* 21.0* 25.6* 25.7*  MCV 92.3   < > 88.0 88.8 89.7 88.6 91.1  PLT 170   < > 80* 109* 106* 107* 98*   < > = values in this interval not displayed.   Cardiac Enzymes: No results for input(s): "CKTOTAL", "CKMB", "CKMBINDEX", "TROPONINI" in the last 168 hours. CBG: Recent Labs  Lab 02/16/22 1719 02/16/22 2041 02/17/22 0026 02/17/22 0649 02/17/22 0720  GLUCAP 165* 146* 184* 142* 148*  Iron Studies: No results for input(s): "IRON", "TIBC", "TRANSFERRIN", "FERRITIN" in the last 72 hours. Studies/Results: VAS [place] LOWER EXTREMITY VENOUS (DVT)  Result Date: 02/16/2022  Lower Venous DVT Study Patient Name:  SYLVANUS TELFORD  Date of Exam:   02/16/2022 Medical Rec #: [id number]         Accession #:    [id number] Date of Birth: 1945/10/28         Patient Gender: M Patient Age:   59 years Exam Location:  [facility] Procedure:      VAS [place] LOWER EXTREMITY VENOUS (DVT) Referring Phys: Vance Gather --------------------------------------------------------------------------------  Indications: Edema.  Comparison Study: no prior Performing Technologist: Archie Patten RVS  Examination Guidelines: A complete evaluation includes B-mode imaging, spectral Doppler, color Doppler, and power Doppler as needed of all accessible portions of each vessel. Bilateral testing is considered an integral part of a complete examination. Limited examinations for reoccurring indications may be performed as noted. The reflux portion of the exam is performed with the patient in reverse Trendelenburg.  +---------+---------------+---------+-----------+----------+--------------+ RIGHT     CompressibilityPhasicitySpontaneityPropertiesThrombus Aging +---------+---------------+---------+-----------+----------+--------------+ CFV      Full           Yes      Yes                                 +---------+---------------+---------+-----------+----------+--------------+ SFJ      Full                                                        +---------+---------------+---------+-----------+----------+--------------+ FV Prox  Full                                                        +---------+---------------+---------+-----------+----------+--------------+ FV Mid   Full                                                        +---------+---------------+---------+-----------+----------+--------------+ FV DistalFull                                                        +---------+---------------+---------+-----------+----------+--------------+ PFV      Full                                                        +---------+---------------+---------+-----------+----------+--------------+ POP      Full           Yes      Yes                                 +---------+---------------+---------+-----------+----------+--------------+  PTV      Full                                                        +---------+---------------+---------+-----------+----------+--------------+ PERO     Full                                                        +---------+---------------+---------+-----------+----------+--------------+   +---------+---------------+---------+-----------+----------+--------------+ LEFT     CompressibilityPhasicitySpontaneityPropertiesThrombus Aging +---------+---------------+---------+-----------+----------+--------------+ CFV      Full           Yes      Yes                                 +---------+---------------+---------+-----------+----------+--------------+ SFJ      Full                                                         +---------+---------------+---------+-----------+----------+--------------+ FV Prox  Full                                                        +---------+---------------+---------+-----------+----------+--------------+ FV Mid   Full                                                        +---------+---------------+---------+-----------+----------+--------------+ FV DistalFull                                                        +---------+---------------+---------+-----------+----------+--------------+ PFV      Full                                                        +---------+---------------+---------+-----------+----------+--------------+ POP      Full           Yes      Yes                                 +---------+---------------+---------+-----------+----------+--------------+ PTV      Full                                                        +---------+---------------+---------+-----------+----------+--------------+   PERO     Full                                                        +---------+---------------+---------+-----------+----------+--------------+     Summary: BILATERAL: - No evidence of deep vein thrombosis seen in the lower extremities, bilaterally. -No evidence of popliteal cyst, bilaterally.   *See table(s) above for measurements and observations. Electronically signed by [name] MD on 02/16/2022 at 7:18:05 PM.    Final    DG CHEST PORT 1 VIEW  Result Date: 02/16/2022 CLINICAL DATA:  Weakness.  Acute respiratory failure with hypoxia. EXAM: PORTABLE CHEST 1 VIEW COMPARISON:  Chest radiograph 02/11/2022. FINDINGS: Enlarged cardiac silhouette, similar. Similar bibasilar opacities. Similar moderate bilateral pleural effusions. No visible pneumothorax. Similar position of a right IJ approach central venous catheter with the tip projecting at the superior cavoatrial junction. IMPRESSION: Similar cardiomegaly,  moderate bilateral pleural effusions, and overlying bibasilar opacities. Findings better characterized on recent CT chest. Electronically Signed   By: Margaretha Sheffield M.D.   On: 02/16/2022 08:15    sodium chloride   Intravenous Once   Chlorhexidine Gluconate Cloth  6 each Topical Q0600   darbepoetin (ARANESP) injection - DIALYSIS  200 mcg Intravenous Q Sat-HD   diltiazem  180 mg Oral Daily   feeding supplement (NEPRO CARB STEADY)  237 mL Oral BID BM   insulin aspart  0-6 Units Subcutaneous TID WC   lidocaine-EPINEPHrine  20 mL Intradermal Once   multivitamin  1 tablet Oral QHS   pantoprazole  40 mg Oral Q0600   polyethylene glycol  17 g Oral BID   sodium chloride flush  10-40 mL Intracatheter Q12H   traZODone  50 mg Oral QHS    BMET    Component Value Date/Time   NA 132 (L) 02/17/2022 0049   NA 137 06/19/2020 0936   K 4.2 02/17/2022 0049   CL 95 (L) 02/17/2022 0049   CO2 21 (L) 02/17/2022 0049   GLUCOSE 183 (H) 02/17/2022 0049   BUN 51 (H) 02/17/2022 0049   BUN 51 (H) 06/19/2020 0936   CREATININE 3.02 (H) 02/17/2022 0049   CREATININE 2.68 (H) 03/26/2016 1839   CALCIUM 7.5 (L) 02/17/2022 0049   GFRNONAA 21 (L) 02/17/2022 0049   GFRAA 21 (L) 06/19/2020 0936   CBC    Component Value Date/Time   WBC 15.6 (H) 02/17/2022 0049   RBC 2.82 (L) 02/17/2022 0049   HGB 8.7 (L) 02/17/2022 0049   HGB 9.1 (L) 09/14/2019 1555   HCT 25.7 (L) 02/17/2022 0049   HCT 28.4 (L) 09/14/2019 1555   PLT 98 (L) 02/17/2022 0049   PLT 464 (H) 09/14/2019 1555   MCV 91.1 02/17/2022 0049   MCV 82 09/14/2019 1555   MCH 30.9 02/17/2022 0049   MCHC 33.9 02/17/2022 0049   RDW 16.8 (H) 02/17/2022 0049   RDW 16.4 (H) 09/14/2019 1555   LYMPHSABS 0.3 (L) 03/07/2022 2130   LYMPHSABS 0.6 (L) 10/05/2017 1433   MONOABS 0.4 02/27/2022 2130   EOSABS 0.0 03/11/2022 2130   EOSABS 0.1 10/05/2017 1433   BASOSABS 0.0 02/19/2022 2130   BASOSABS 0.0 10/05/2017 1433   Assessment/Plan:   New ESRD - has had  progressive azotemia and volume overload in setting of CKD stage V.  He had temp RIJ HD catheter  placed 03/02/2022 and HD 02/11/22.  He has had 4 HD sessions.   He has been set up with outpatient HD at [facility] on MWF schedule (12:15 chair time but will need to be there by 11:30 on first day to fill out paperwork).   Will continue with HD on MWF schedule and will UF as tolerated with HD today. Junctional bradycardia - due to hyperkalemia as well as amiodarone and diltiazem.  Resolved with dialysis. Vascular access - RIJ TDC placed 02/15/22, RUE AVG placed by [name] on 02/08/22. 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 HTN - stable Retroperitoneal paraganglioma - to be seen at [facility] as he was deemed too high risk for surgery here. FTT - due to paraganglioma and progressive CKD.  Continue with protein supplements.    Donetta Potts, MD [facility]

## 2022-02-17 NOTE — Progress Notes (Addendum)
Mobility Specialist - Progress Note   02/17/22 1630  Mobility  Activity Ambulated with assistance in hallway  Level of Assistance Moderate assist, patient does 50-74%  Assistive Device Front wheel walker  Distance Ambulated (ft) 100 ft  Activity Response Tolerated well  $Mobility charge 1 Mobility   Pt received sitting EOB agreeable to mobility. ModA to stand. CG for ambulation. Took a few short standing rest breaks. Pt left in bed w/call bell in reach.   Paulla Dolly Mobility Specialist

## 2022-02-17 NOTE — Plan of Care (Signed)
  Problem: Education: Goal: Knowledge of General Education information will improve Description: Including pain rating scale, medication(s)/side effects and non-pharmacologic comfort measures Outcome: Progressing   Problem: Health Behavior/Discharge Planning: Goal: Ability to manage health-related needs will improve Outcome: Progressing   Problem: Clinical Measurements: Goal: Ability to maintain clinical measurements within normal limits will improve Outcome: Progressing Goal: Will remain free from infection Outcome: Progressing Goal: Diagnostic test results will improve Outcome: Progressing Goal: Respiratory complications will improve Outcome: Progressing Goal: Cardiovascular complication will be avoided Outcome: Progressing   Problem: Activity: Goal: Risk for activity intolerance will decrease Outcome: Progressing   Problem: Nutrition: Goal: Adequate nutrition will be maintained Outcome: Progressing   Problem: Coping: Goal: Level of anxiety will decrease Outcome: Progressing   Problem: Elimination: Goal: Will not experience complications related to bowel motility Outcome: Progressing Goal: Will not experience complications related to urinary retention Outcome: Progressing   Problem: Pain Managment: Goal: General experience of comfort will improve Outcome: Progressing   Problem: Safety: Goal: Ability to remain free from injury will improve Outcome: Progressing   Problem: Skin Integrity: Goal: Risk for impaired skin integrity will decrease Outcome: Progressing   Problem: Education: Goal: Ability to describe self-care measures that may prevent or decrease complications (Diabetes Survival Skills Education) will improve Outcome: Progressing Goal: Individualized Educational Video(s) Outcome: Progressing   Problem: Coping: Goal: Ability to adjust to condition or change in health will improve Outcome: Progressing   Problem: Fluid Volume: Goal: Ability to  maintain a balanced intake and output will improve Outcome: Progressing   Problem: Health Behavior/Discharge Planning: Goal: Ability to identify and utilize available resources and services will improve Outcome: Progressing Goal: Ability to manage health-related needs will improve Outcome: Progressing   Problem: Metabolic: Goal: Ability to maintain appropriate glucose levels will improve Outcome: Progressing   Problem: Skin Integrity: Goal: Risk for impaired skin integrity will decrease Outcome: Progressing   Problem: Nutritional: Goal: Maintenance of adequate nutrition will improve Outcome: Progressing Goal: Progress toward achieving an optimal weight will improve Outcome: Progressing   Problem: Tissue Perfusion: Goal: Adequacy of tissue perfusion will improve Outcome: Progressing

## 2022-02-17 NOTE — Progress Notes (Signed)
Progress Note  Patient: Adam Reid PXT:[id number] DOB: 09/12/1945  DOA: 03/07/2022  DOS: 02/17/2022    Brief hospital course: Adam Reid is a 76 y.o. male with a history of retroperitoneal paraganglioma, stage V CKD, chronic HFpEF, PAF who presented to the ED 8/2 with weakness, leg swelling. He was found to have junctional bradycardia with hyperkalemia, acidosis, uremia, volume overload, and elevated LFTs. Imaging revealed loculated right pleural effusion. Temporary HD catheter was inserted and HD initiated urgently on 8/3. The hospitalization has been complicated by respiratory failure, liver failure and thrombocytopenia, epistaxis, coffee-ground emesis, and anemia requiring transfusions.   Assessment and Plan: ESRD: New initiation of hemodialysis this admission 8/3, now s/p tunneled HD catheter placed 8/7 by IR, Dr. Earleen Newport. Also has RUE AVG placed 7/31.  - HD 8/7, planning again 8/9. Still with edema and JVD.  Acute on chronic hypoxic respiratory failure: Due to acute on chronic HFpEF due to renal failure. Per pt/spouse, he's been on 2-3L for several months.  - Weaned oxygen to home 2.5L. Imaging thus far with R > L pleural effusion that appears to have been there since 2021. Repeat CXR this AM personally reviewed with stable bilateral R > L pleural effusions, no new consolidation.    Liver failure with coagulopathy: Normal morphology on CT without biliary obstruction, dopplers negative, serological work up including  ANA, smooth muscle IgG, IgG, CMV IgM, EBV IgM, Hep A IgM, HBV core AB has returned negative, GI suspects congestive hepatopathy/initial insult. LFTs have begun showing improvement, though cholestatic pattern remains. No plan for Bx per GI. - Patient's jaundice continues to worsen with rising bilirubin. Given his baseline frail status and high readmission risk, the family agrees to remaining inpatient until this at least stabilizes. AST/ALT continue improving. GGT elevated. -  Holding amiodarone  Acute blood loss anemia on chronic anemia of iron deficiency and CKD:  - s/p 3u (8/3, 8/4, 8/7). Still holding anticoagulation.  - Continue ESA per nephrology.  Coffee-ground emesis, history of GERD, Barrett's esophagus, small bowel AVMs.  - GI consulted, was planning enteroscopy, cancelled with resolved bleeding and concern for liver failure. GI has signed off without plans for further evaluation.  - Continue PPI - No further bleeding noted at this time.   Thrombocytopenia: Improved s/p 1u 8/4. - Stabilizing level ~ 100k, will monitor. Still unable to resume anticoagulation.  Epistaxis: s/p packing by ENT 8/4, since discontinued. Resolved.  Acute on chronic HFpEF: Still with LE edema, but improved. LVEF 55-60%, stable RV function. - Volume managed by dialysis.    Paroxysmal AFib with junctional bradycardia on arrival which has resolved:  - Continue diltiazem for rate control since no longer junctional. - Amiodarone discontinued with liver failure  Sepsis due to Pseudomonas UTI:  - Continue cefepime (started 8/5). Leukocytosis is persistent, PCT is 7.6, though he is afebrile without focal evidence of alternative infection. Due for 5th day's dose today.  Right AVG wound dehiscence: Distal wound with mild dehiscence, no evidence of local infection.  - [name] closed wound at bedside 8/8, will follow up in 10-14 days.  Retroperitoneal paraganglioma: BP remains low at this time.  - BP will not allow for cardura resumption, will continue to monitor, but don't want to further limit UF with HD. - Will follow up with endocrine surgery at [facility] after discharge.   T2DM: HbA1c 7.4%. - Continue SSI  Hyperkalemia, metabolic acidosis, hyponatremia: All improved and continuing to be managed with dialysis.   Severe protein calorie malnutrition:  -  Supplement protein. RD consulted.   Leg swelling is persistent: In setting of malignancy, holding anticoagulation, will  r/o DVT w/venous U/S.  Subjective: Resting quietly with no complaints. He feels very fatigued. No abdominal pain.  Objective: Vitals:   02/17/22 0305 02/17/22 0400 02/17/22 0717 02/17/22 1131  BP: (!) 90/34 (!) 111/47 (!) 119/54 (!) 120/54  Pulse: 65   73  Resp: '17 18 19 20  '$ Temp: 97.8 F (36.6 C)  97.9 F (36.6 C) 98 F (36.7 C)  TempSrc: Oral  Oral Oral  SpO2:    95%  Weight:      Height:       Gen: Very frail, resting, male in no distress Pulm: Nonlabored breathing 2L O2, clear with diminished bases. CV: Regular rate and rhythm. No murmur, rub, or gallop. + JVD, + dependent edema. GI: Abdomen soft, non-tender, non-distended, with normoactive bowel sounds.  Ext: Warm, no deformities Skin: No new rashes, lesions or ulcers on visualized skin. Jaundice is significant. Neuro: Alert and oriented. No focal neurological deficits. Psych: Judgement and insight appear fair, calm.  Data Personally reviewed: CBC: Recent Labs  Lab 02/09/2022 2130 02/28/2022 2140 03/05/2022 0349 02/14/22 0630 02/15/22 0052 02/16/22 0123 02/17/22 0049  WBC 5.1   < > 13.0* 15.9* 15.4* 16.6* 15.6*  NEUTROABS 4.4  --   --   --   --   --   --   HGB 6.8*   < > 7.2* 7.1* 7.0* 8.9* 8.7*  HCT 21.5*   < > 21.3* 21.5* 21.0* 25.6* 25.7*  MCV 92.3   < > 88.0 88.8 89.7 88.6 91.1  PLT 170   < > 80* 109* 106* 107* 98*   < > = values in this interval not displayed.   Basic Metabolic Panel: Recent Labs  Lab 03/06/2022 2130 03/01/2022 2140 02/12/22 5329 03/11/2022 0349 02/14/22 0341 02/15/22 0052 02/16/22 0123 02/17/22 0049  NA 140   < > 133* 133* 132* 134* 134* 132*  K 6.9*   < > 4.1 4.8 4.3 4.7 3.9 4.2  CL 100   < > 93* 93* 93* 94* 95* 95*  CO2 18*   < > '26 24 24 24 26 '$ 21*  GLUCOSE 63*   < > 121* 120* 116* 137* 218* 183*  BUN 122*   < > 55* 91* 51* 72* 34* 51*  CREATININE 5.57*   < > 2.62* 3.70* 2.55* 3.38* 2.04* 3.02*  CALCIUM 8.8*   < > 8.1* 7.8* 7.5* 7.6* 7.7* 7.5*  MG 2.9*  --   --  2.1  --   --   --    --   PHOS  --   --  6.1*  --   --   --   --   --    < > = values in this interval not displayed.   GFR: Estimated Creatinine Clearance: 17.1 mL/min (A) (by C-G formula based on SCr of 3.02 mg/dL (H)). Liver Function Tests: Recent Labs  Lab 03/07/2022 0349 02/14/22 0341 02/15/22 0052 02/16/22 0123 02/17/22 0049  AST 3,065* 1,180* 573* 310* 206*  ALT 1,045* 667* 469* 257* 134*  ALKPHOS 1,049* 1,000* 1,103* 1,205* 1,251*  BILITOT 4.1* 5.3* 6.1* 7.9* 8.4*  PROT 5.0* 5.0* 5.1* 5.3* 4.9*  ALBUMIN 2.5* 2.4* 2.4* 2.4* 2.3*   No results for input(s): "LIPASE", "AMYLASE" in the last 168 hours. No results for input(s): "AMMONIA" in the last 168 hours. Coagulation Profile: Recent Labs  Lab 02/20/2022 0349 02/14/22 0341 02/15/22  1275 02/16/22 0123 02/17/22 0049  INR 2.3* 1.7* 1.5* 1.3* 1.4*   Cardiac Enzymes: No results for input(s): "CKTOTAL", "CKMB", "CKMBINDEX", "TROPONINI" in the last 168 hours. BNP (last 3 results) Recent Labs    12/10/21 1339  PROBNP 12,645*   HbA1C: No results for input(s): "HGBA1C" in the last 72 hours. CBG: Recent Labs  Lab 02/16/22 2041 02/17/22 0026 02/17/22 0649 02/17/22 0720 02/17/22 1130  GLUCAP 146* 184* 142* 148* 179*   Lipid Profile: No results for input(s): "CHOL", "HDL", "LDLCALC", "TRIG", "CHOLHDL", "LDLDIRECT" in the last 72 hours. Thyroid Function Tests: No results for input(s): "TSH", "T4TOTAL", "FREET4", "T3FREE", "THYROIDAB" in the last 72 hours. Anemia Panel: No results for input(s): "VITAMINB12", "FOLATE", "FERRITIN", "TIBC", "IRON", "RETICCTPCT" in the last 72 hours. Urine analysis:    Component Value Date/Time   COLORURINE AMBER (A) 02/11/2022 0845   APPEARANCEUR CLOUDY (A) 02/11/2022 0845   LABSPEC 1.010 02/11/2022 0845   PHURINE 5.0 02/11/2022 0845   GLUCOSEU NEGATIVE 02/11/2022 0845   HGBUR LARGE (A) 02/11/2022 0845   BILIRUBINUR NEGATIVE 02/11/2022 0845   BILIRUBINUR negative 03/02/2021 1458   BILIRUBINUR neg  03/23/2015 1404   KETONESUR NEGATIVE 02/11/2022 0845   PROTEINUR 100 (A) 02/11/2022 0845   UROBILINOGEN 0.2 03/02/2021 1458   NITRITE NEGATIVE 02/11/2022 0845   LEUKOCYTESUR SMALL (A) 02/11/2022 0845   Recent Results (from the past 240 hour(s))  MRSA Next Gen by PCR, Nasal     Status: None   Collection Time: 02/11/22  1:50 AM   Specimen: Nasal Mucosa; Nasal Swab  Result Value Ref Range Status   MRSA by PCR Next Gen NOT DETECTED NOT DETECTED Final    Comment: (NOTE) The GeneXpert MRSA Assay (FDA approved for NASAL specimens only), is one component of a comprehensive MRSA colonization surveillance program. It is not intended to diagnose MRSA infection nor to guide or monitor treatment for MRSA infections. Test performance is not FDA approved in patients less than 35 years old. Performed at [facility], [place] [address], [place], [place] [address]   Culture, Urine (Do not remove urinary catheter, catheter placed by urology or difficult to place)     Status: Abnormal   Collection Time: 02/11/22  1:51 PM   Specimen: Urine, Catheterized  Result Value Ref Range Status   Specimen Description URINE, CATHETERIZED  Final   Special Requests   Final    NONE Performed at [facility], [place] [address] [place], [place] [address]    Culture 30,000 COLONIES/mL PSEUDOMONAS PUTIDA (A)  Final   Report Status 03/10/2022 FINAL  Final   Organism ID, Bacteria PSEUDOMONAS PUTIDA (A)  Final      Susceptibility   Pseudomonas putida - MIC*    CEFTAZIDIME 2 SENSITIVE Sensitive     CIPROFLOXACIN <=0.25 SENSITIVE Sensitive     GENTAMICIN <=1 SENSITIVE Sensitive     IMIPENEM 0.5 SENSITIVE Sensitive     PIP/TAZO 8 SENSITIVE Sensitive     * 30,000 COLONIES/mL PSEUDOMONAS PUTIDA     VAS [place] LOWER EXTREMITY VENOUS (DVT)  Result Date: 02/16/2022  Lower Venous DVT Study Patient Name:  SADAO WEYER  Date of Exam:   02/16/2022 Medical Rec #: [id number]         Accession #:    [id number] Date of Birth:  [birth year]         Patient Gender: M Patient Age:   67 years Exam Location:  [facility] Procedure:      VAS [place]  LOWER EXTREMITY VENOUS (DVT) Referring Phys: Vance Gather --------------------------------------------------------------------------------  Indications: Edema.  Comparison Study: no prior Performing Technologist: Archie Patten RVS  Examination Guidelines: A complete evaluation includes B-mode imaging, spectral Doppler, color Doppler, and power Doppler as needed of all accessible portions of each vessel. Bilateral testing is considered an integral part of a complete examination. Limited examinations for reoccurring indications may be performed as noted. The reflux portion of the exam is performed with the patient in reverse Trendelenburg.  +---------+---------------+---------+-----------+----------+--------------+ RIGHT    CompressibilityPhasicitySpontaneityPropertiesThrombus Aging +---------+---------------+---------+-----------+----------+--------------+ CFV      Full           Yes      Yes                                 +---------+---------------+---------+-----------+----------+--------------+ SFJ      Full                                                        +---------+---------------+---------+-----------+----------+--------------+ FV Prox  Full                                                        +---------+---------------+---------+-----------+----------+--------------+ FV Mid   Full                                                        +---------+---------------+---------+-----------+----------+--------------+ FV DistalFull                                                        +---------+---------------+---------+-----------+----------+--------------+ PFV      Full                                                        +---------+---------------+---------+-----------+----------+--------------+ POP      Full           Yes      Yes                                  +---------+---------------+---------+-----------+----------+--------------+ PTV      Full                                                        +---------+---------------+---------+-----------+----------+--------------+ PERO     Full                                                        +---------+---------------+---------+-----------+----------+--------------+   +---------+---------------+---------+-----------+----------+--------------+  LEFT     CompressibilityPhasicitySpontaneityPropertiesThrombus Aging +---------+---------------+---------+-----------+----------+--------------+ CFV      Full           Yes      Yes                                 +---------+---------------+---------+-----------+----------+--------------+ SFJ      Full                                                        +---------+---------------+---------+-----------+----------+--------------+ FV Prox  Full                                                        +---------+---------------+---------+-----------+----------+--------------+ FV Mid   Full                                                        +---------+---------------+---------+-----------+----------+--------------+ FV DistalFull                                                        +---------+---------------+---------+-----------+----------+--------------+ PFV      Full                                                        +---------+---------------+---------+-----------+----------+--------------+ POP      Full           Yes      Yes                                 +---------+---------------+---------+-----------+----------+--------------+ PTV      Full                                                        +---------+---------------+---------+-----------+----------+--------------+ PERO     Full                                                         +---------+---------------+---------+-----------+----------+--------------+     Summary: BILATERAL: - No evidence of deep vein thrombosis seen in the lower extremities, bilaterally. -No evidence of popliteal cyst, bilaterally.   *See table(s) above for measurements and observations. Electronically signed by [name] MD on 02/16/2022 at 7:18:05 PM.  Final    DG CHEST PORT 1 VIEW  Result Date: 02/16/2022 CLINICAL DATA:  Weakness.  Acute respiratory failure with hypoxia. EXAM: PORTABLE CHEST 1 VIEW COMPARISON:  Chest radiograph 02/11/2022. FINDINGS: Enlarged cardiac silhouette, similar. Similar bibasilar opacities. Similar moderate bilateral pleural effusions. No visible pneumothorax. Similar position of a right IJ approach central venous catheter with the tip projecting at the superior cavoatrial junction. IMPRESSION: Similar cardiomegaly, moderate bilateral pleural effusions, and overlying bibasilar opacities. Findings better characterized on recent CT chest. Electronically Signed   By: Margaretha Sheffield M.D.   On: 02/16/2022 08:15     Family Communication: Spouse by speaker phone at bedside  Disposition: Status is: Inpatient Remains inpatient appropriate because: Persistent/worsening hyperbilirubinemia Planned Discharge Destination: Home with [place], MD 02/17/2022 2:30 PM Page by [email or web address]

## 2022-02-18 ENCOUNTER — Inpatient Hospital Stay (HOSPITAL_COMMUNITY): Payer: Medicare Other

## 2022-02-18 DIAGNOSIS — N186 End stage renal disease: Secondary | ICD-10-CM | POA: Diagnosis not present

## 2022-02-18 DIAGNOSIS — Z992 Dependence on renal dialysis: Secondary | ICD-10-CM | POA: Diagnosis not present

## 2022-02-18 HISTORY — PX: IR FLUORO GUIDE CV LINE RIGHT: IMG2283

## 2022-02-18 LAB — CBC
HCT: 24.9 % — ABNORMAL LOW (ref 39.0–52.0)
Hemoglobin: 8.4 g/dL — ABNORMAL LOW (ref 13.0–17.0)
MCH: 31.1 pg (ref 26.0–34.0)
MCHC: 33.7 g/dL (ref 30.0–36.0)
MCV: 92.2 fL (ref 80.0–100.0)
Platelets: 101 10*3/uL — ABNORMAL LOW (ref 150–400)
RBC: 2.7 MIL/uL — ABNORMAL LOW (ref 4.22–5.81)
RDW: 19.2 % — ABNORMAL HIGH (ref 11.5–15.5)
WBC: 15.2 10*3/uL — ABNORMAL HIGH (ref 4.0–10.5)
nRBC: 5.8 % — ABNORMAL HIGH (ref 0.0–0.2)

## 2022-02-18 LAB — GLUCOSE, CAPILLARY
Glucose-Capillary: 154 mg/dL — ABNORMAL HIGH (ref 70–99)
Glucose-Capillary: 145 mg/dL — ABNORMAL HIGH (ref 70–99)
Glucose-Capillary: 126 mg/dL — ABNORMAL HIGH (ref 70–99)
Glucose-Capillary: 119 mg/dL — ABNORMAL HIGH (ref 70–99)
Glucose-Capillary: 108 mg/dL — ABNORMAL HIGH (ref 70–99)
Glucose-Capillary: 121 mg/dL — ABNORMAL HIGH (ref 70–99)
Glucose-Capillary: 127 mg/dL — ABNORMAL HIGH (ref 70–99)

## 2022-02-18 LAB — COMPREHENSIVE METABOLIC PANEL
ALT: 97 U/L — ABNORMAL HIGH (ref 0–44)
AST: 212 U/L — ABNORMAL HIGH (ref 15–41)
Albumin: 2.3 g/dL — ABNORMAL LOW (ref 3.5–5.0)
Alkaline Phosphatase: 1471 U/L — ABNORMAL HIGH (ref 38–126)
Anion gap: 15 (ref 5–15)
BUN: 62 mg/dL — ABNORMAL HIGH (ref 8–23)
CO2: 23 mmol/L (ref 22–32)
Calcium: 7.3 mg/dL — ABNORMAL LOW (ref 8.9–10.3)
Chloride: 94 mmol/L — ABNORMAL LOW (ref 98–111)
Creatinine, Ser: 4.03 mg/dL — ABNORMAL HIGH (ref 0.61–1.24)
GFR, Estimated: 15 mL/min — ABNORMAL LOW (ref >60)
Glucose, Bld: 152 mg/dL — ABNORMAL HIGH (ref 70–99)
Potassium: 4.3 mmol/L (ref 3.5–5.1)
Sodium: 132 mmol/L — ABNORMAL LOW (ref 135–145)
Total Bilirubin: 9.5 mg/dL — ABNORMAL HIGH (ref 0.3–1.2)
Total Protein: 5.1 g/dL — ABNORMAL LOW (ref 6.5–8.1)

## 2022-02-18 LAB — PROTIME-INR
INR: 1.3 — ABNORMAL HIGH (ref 0.8–1.2)
Prothrombin Time: 15.7 seconds — ABNORMAL HIGH (ref 11.4–15.2)

## 2022-02-18 MED ORDER — ANTICOAGULANT SODIUM CITRATE 4% (200MG/5ML) IV SOLN
5.0000 mL | Freq: Once | Status: AC
  Administered 2022-02-18: 5 mL
  Filled 2022-02-18 (×2): qty 5

## 2022-02-18 MED ORDER — LIDOCAINE HCL (PF) 1 % IJ SOLN
INTRAMUSCULAR | Status: DC | PRN
  Administered 2022-02-18: 10 mL

## 2022-02-18 MED ORDER — HEPARIN SODIUM (PORCINE) 1000 UNIT/ML IJ SOLN
INTRAMUSCULAR | Status: AC
  Filled 2022-02-18: qty 2

## 2022-02-18 MED ORDER — VANCOMYCIN HCL IN DEXTROSE 1-5 GM/200ML-% IV SOLN
1000.0000 mg | Freq: Once | INTRAVENOUS | Status: AC
  Administered 2022-02-18: 1000 mg via INTRAVENOUS
  Filled 2022-02-18: qty 200

## 2022-02-18 MED ORDER — ANTICOAGULANT SODIUM CITRATE 4% (200MG/5ML) IV SOLN
10.0000 mL | Freq: Once | Status: AC
  Administered 2022-02-22: 10 mL
  Filled 2022-02-18 (×4): qty 10

## 2022-02-18 MED ORDER — LIDOCAINE HCL 1 % IJ SOLN
INTRAMUSCULAR | Status: AC
  Filled 2022-02-18: qty 20

## 2022-02-18 NOTE — Progress Notes (Signed)
Mobility Specialist - Progress Note   02/18/22 1432  Mobility  Activity Off unit   Pt at HD. Will f/u as schedule permits.   Paulla Dolly Mobility Specialist

## 2022-02-18 NOTE — Progress Notes (Signed)
Received patient on bed from 2C11. Alert, oriented and not in distress. VS taken BP 104/25mHg, HR 73bpm, Sats 100% on 2L. O2. T 97.40F. Not complaining of any pain. Weight 62.4kg   Noticed HD catheter venous is without cap and with blood clots noticeably, could not aspirate anything. Arterial is fine . Notified DR. KJohnney Ouand said she will write order in the morning and to send pt back to his room.   Labs send collected from arterial line.   Report given to RN on the floor.

## 2022-02-18 NOTE — Progress Notes (Signed)
PHARMACY NOTE:  ANTIMICROBIAL RENAL DOSAGE ADJUSTMENT  Current antimicrobial regimen includes a mismatch between antimicrobial dosage and estimated renal function.  As per policy approved by the Pharmacy & Therapeutics and Medical Executive Committees, the antimicrobial dosage will be adjusted accordingly.  Current antimicrobial dosage:  Vancomycin '1500mg'$    Indication: surgical prophylaxis  Renal Function:  Estimated Creatinine Clearance: 14.2 mL/min (A) (by C-G formula based on SCr of 4.03 mg/dL (H)). '[x]'$      On intermittent HD, scheduled: '[]'$      On CRRT    Antimicrobial dosage has been changed to:  vancomycin '1000mg'$  x1   Additional comments:   Thank you for allowing pharmacy to be a part of this patient's care.  Benetta Spar, PharmD, BCPS, BCCP Clinical Pharmacist  Please check AMION for all [place] phone numbers After 10:00 PM, call [place] [phone]

## 2022-02-18 NOTE — Progress Notes (Signed)
Earlier this morning patient transferred to HD at 0200 - at this time it was noted that a cap was missing on the venous port. HD RN attempted to flush but was unable - clots noted. Cap was placed on port and patient was sent back to 2C11. Patient went to IR first thing this am to get HD cath exchanged. Spoke with RN caring for patient during the 7p-7a shift - RN shared that during her 8pm and 12am assessment, all caps were in place. Blood Cultures were ordered due to the missing cap and a 1x dose of Vanc ordered to be administered after HD today by Dr. Marval Regal. Spoke with Dr. Hulen Skains during [facility] rounds about the above situation - chart was reviewed by writer and Dr. Hulen Skains who felt blood cultures were not indicated as patient came in with sepsis related to UTI, elevated WBC, and patient already receiving IV abx for this. Spoke with Dr. [name] and shared all of the above - Blood Cultures cancelled per Dr. [name] however order was given to still administer the one time dose of IV Vanc. Primary RN updated and made aware.

## 2022-02-18 NOTE — Progress Notes (Signed)
PT Cancellation Note  Patient Details Name: Adonnis Salceda MRN: [id number] DOB: [birth year]   Cancelled Treatment:    Reason Eval/Treat Not Completed: Patient at procedure or test/unavailable, (AM pt declining as just back from IR, PM pt off unit at HD). Will check back as schedule allows to continue with PT POC.  Audry Riles. PTA Acute Rehabilitation Services Office: [place] 02/18/2022, 2:49 PM

## 2022-02-18 NOTE — Progress Notes (Signed)
OT Cancellation Note  Patient Details Name: Adam Reid MRN: [id number] DOB: May 13, 1946   Cancelled Treatment:    Reason Eval/Treat Not Completed: Patient at procedure or test/ unavailable (Pt in IR. Will continue to follow.)  Malka So 02/18/2022, 8:54 AM Cleta Alberts, OTR/L Acute Rehabilitation Services Office: [phone]

## 2022-02-18 NOTE — Progress Notes (Signed)
Patient is 3 days s/p placement of TDC.  When patient arrived to dialysis yesterday, reportedly the cap to the venous port was off and there was noticeable blood clot on this side.  It would not aspirate and he was unable to receive dialysis.  Pt to come to IR today for [facility] exchange as per request from Dr. Johnney Ou.  He will be consented in IR.  Electronically Signed: Pasty Spillers, PA-C 02/18/2022, 8:29 AM

## 2022-02-18 NOTE — Procedures (Signed)
Interventional Radiology Procedure Note  Procedure: Exchange of a right IJ approach tunneled HD.  New 19cm tip to cuff.  Tip is positioned at the superior cavoatrial junction and catheter is ready for immediate use.  Complications: None Recommendations:  - Ok to use - Do not submerge - Routine line care   Signed,  Dulcy Fanny. Earleen Newport, DO

## 2022-02-18 NOTE — Progress Notes (Signed)
Progress Note  Patient: Adam Reid SNK:[id number] DOB: [birth year]  DOA: 02/20/2022  DOS: 02/18/2022    Brief hospital course: Adam Reid is a 76 y.o. male with a history of retroperitoneal paraganglioma, stage V CKD, chronic HFpEF, PAF who presented to the ED 8/2 with weakness, leg swelling. He was found to have junctional bradycardia with hyperkalemia, acidosis, uremia, volume overload, and elevated LFTs. Imaging revealed loculated right pleural effusion. Temporary HD catheter was inserted and HD initiated urgently on 8/3. The hospitalization has been complicated by respiratory failure, liver failure and thrombocytopenia, epistaxis, coffee-ground emesis, and anemia requiring transfusions.   8/10 drawsy in bed. Came back from IR had exchange of Rt IJ .  Assessment and Plan: ESRD: New initiation of hemodialysis this admission 8/3, now s/p tunneled HD catheter placed 8/7 by IR, Dr. Earleen Newport. Also has RUE AVG placed 7/31.  - HD 8/7, planning again 8/9.  8/10 s/p Rt IR exchange. Has been setup for outpt HD Continue HD on MWF schedule, HD today Went to HD earlier this am , Rn noted venous cap missing from HD catheter and was clotted and unable to use. Safety zone portal initiated and pt have TDC replaced as above. Bcx sent and started on vanco and continue cefepime after HD for prophylaxis by nephrology.   Acute on chronic hypoxic respiratory failure: Due to acute on chronic HFpEF due to renal failure. Per pt/spouse, he's been on 2-3L for several months.  - Weaned oxygen to home 2.5L. Imaging thus far with R > L pleural effusion that appears to have been there since 2021. Repeat CXR  with stable bilateral R > L pleural effusions, no new consolidation.  8/10 volume management with HD HD today   Liver failure with coagulopathy: Normal morphology on CT without biliary obstruction, dopplers negative, serological work up including  ANA, smooth muscle IgG, IgG, CMV IgM, EBV IgM, Hep A IgM, HBV  core AB has returned negative, GI suspects congestive hepatopathy/initial insult. LFTs have begun showing improvement, though cholestatic pattern remains. No plan for Bx per GI. - Patient's jaundice continues to worsen with rising bilirubin. Given his baseline frail status and high readmission risk, the family agrees to remaining inpatient until this at least stabilizes. AST/ALT continue improving. GGT elevated. 8/10 hold amiodarone  Acute blood loss anemia on chronic anemia of iron deficiency and CKD:  - s/p 3u (8/3, 8/4, 8/7). Still holding anticoagulation.  8/10 continue ESA, transfuse if Hg of 7 or less   Coffee-ground emesis, history of GERD, Barrett's esophagus, small bowel AVMs.  - GI consulted, was planning enteroscopy, cancelled with resolved bleeding and concern for liver failure. GI has signed off without plans for further evaluation.  - Continue PPI - No further bleeding noted at this time.   Thrombocytopenia: Improved s/p 1u 8/4. - Stabilizing level ~ 100k, will monitor. Still unable to resume anticoagulation.  Epistaxis: s/p packing by ENT 8/4, since discontinued. Resolved.  Acute on chronic HFpEF: Still with LE edema, but improved. LVEF 55-60%, stable RV function. - Volume managed by dialysis.    Paroxysmal AFib with junctional bradycardia on arrival which has resolved:  - Continue diltiazem for rate control since no longer junctional. - Amiodarone discontinued with liver failure  Sepsis due to Pseudomonas UTI:  - Continue cefepime (started 8/5). Leukocytosis is persistent, PCT is 7.6, though he is afebrile without focal evidence of alternative infection.  Right AVG wound dehiscence: Distal wound with mild dehiscence, no evidence of local infection.  - [name]  closed wound at bedside 8/8, will follow up in 10-14 days.  Retroperitoneal paraganglioma: BP remains low at this time.  - BP will not allow for cardura resumption, will continue to monitor, but don't want to  further limit UF with HD. - Will follow [facility] at [facility] after discharge.   T2DM: HbA1c 7.4%. - Continue SSI  Hyperkalemia, metabolic acidosis, hyponatremia: All improved and continuing to be managed with dialysis.   Severe protein calorie malnutrition:  - Supplement protein. RD consulted.   Leg swelling is persistent: In setting of malignancy, holding anticoagulation, will r/o DVT w/venous U/S.  Subjective:  drawsy , came back from IR ealier. Denies sob, cp, abd pain.   Objective: Vitals:   02/18/22 0401 02/18/22 0500 02/18/22 0511 02/18/22 0751  BP:  (!) 114/52 (!) 104/52 (!) 113/54  Pulse:  75 73 71  Resp:  '12 14 20  '$ Temp:   97.8 F (36.6 C) 97.6 F (36.4 C)  TempSrc:    Oral  SpO2: 98% 97% 100% 97%  Weight:   62.4 kg   Height:       Calm, NAD Decrease bs  b/l bases Reg s1/s2 no gallop Soft nt +bs +LE edema b/l  Mood and affect appropriate in current setting   Data Personally reviewed: CBC: Recent Labs  Lab 02/14/22 0630 02/15/22 0052 02/16/22 0123 02/17/22 0049 02/18/22 0209  WBC 15.9* 15.4* 16.6* 15.6* 15.2*  HGB 7.1* 7.0* 8.9* 8.7* 8.4*  HCT 21.5* 21.0* 25.6* 25.7* 24.9*  MCV 88.8 89.7 88.6 91.1 92.2  PLT 109* 106* 107* 98* 720*   Basic Metabolic Panel: Recent Labs  Lab 02/12/22 0607 02/16/2022 0349 02/14/22 0341 02/15/22 0052 02/16/22 0123 02/17/22 0049 02/18/22 0209  NA 133* 133* 132* 134* 134* 132* 132*  K 4.1 4.8 4.3 4.7 3.9 4.2 4.3  CL 93* 93* 93* 94* 95* 95* 94*  CO2 '26 24 24 24 26 '$ 21* 23  GLUCOSE 121* 120* 116* 137* 218* 183* 152*  BUN 55* 91* 51* 72* 34* 51* 62*  CREATININE 2.62* 3.70* 2.55* 3.38* 2.04* 3.02* 4.03*  CALCIUM 8.1* 7.8* 7.5* 7.6* 7.7* 7.5* 7.3*  MG  --  2.1  --   --   --   --   --   PHOS 6.1*  --   --   --   --   --   --    GFR: Estimated Creatinine Clearance: 14.2 mL/min (A) (by C-G formula based on SCr of 4.03 mg/dL (H)). Liver Function Tests: Recent Labs  Lab 02/14/22 0341 02/15/22 0052  02/16/22 0123 02/17/22 0049 02/18/22 0209  AST 1,180* 573* 310* 206* 212*  ALT 667* 469* 257* 134* 97*  ALKPHOS 1,000* 1,103* 1,205* 1,251* 1,471*  BILITOT 5.3* 6.1* 7.9* 8.4* 9.5*  PROT 5.0* 5.1* 5.3* 4.9* 5.1*  ALBUMIN 2.4* 2.4* 2.4* 2.3* 2.3*   No results for input(s): "LIPASE", "AMYLASE" in the last 168 hours. No results for input(s): "AMMONIA" in the last 168 hours. Coagulation Profile: Recent Labs  Lab 02/14/22 0341 02/15/22 0927 02/16/22 0123 02/17/22 0049 02/18/22 0209  INR 1.7* 1.5* 1.3* 1.4* 1.3*   Cardiac Enzymes: No results for input(s): "CKTOTAL", "CKMB", "CKMBINDEX", "TROPONINI" in the last 168 hours. BNP (last 3 results) Recent Labs    12/10/21 1339  PROBNP 12,645*   HbA1C: No results for input(s): "HGBA1C" in the last 72 hours. CBG: Recent Labs  Lab 02/17/22 0720 02/17/22 1130 02/17/22 1555 02/17/22 2009 02/17/22 2328  GLUCAP 148* 179* 141*  125* 141*   Lipid Profile: No results for input(s): "CHOL", "HDL", "LDLCALC", "TRIG", "CHOLHDL", "LDLDIRECT" in the last 72 hours. Thyroid Function Tests: No results for input(s): "TSH", "T4TOTAL", "FREET4", "T3FREE", "THYROIDAB" in the last 72 hours. Anemia Panel: No results for input(s): "VITAMINB12", "FOLATE", "FERRITIN", "TIBC", "IRON", "RETICCTPCT" in the last 72 hours. Urine analysis:    Component Value Date/Time   COLORURINE AMBER (A) 02/11/2022 0845   APPEARANCEUR CLOUDY (A) 02/11/2022 0845   LABSPEC 1.010 02/11/2022 0845   PHURINE 5.0 02/11/2022 0845   GLUCOSEU NEGATIVE 02/11/2022 0845   HGBUR LARGE (A) 02/11/2022 0845   BILIRUBINUR NEGATIVE 02/11/2022 0845   BILIRUBINUR negative 03/02/2021 1458   BILIRUBINUR neg 03/23/2015 1404   KETONESUR NEGATIVE 02/11/2022 0845   PROTEINUR 100 (A) 02/11/2022 0845   UROBILINOGEN 0.2 03/02/2021 1458   NITRITE NEGATIVE 02/11/2022 0845   LEUKOCYTESUR SMALL (A) 02/11/2022 0845   Recent Results (from the past 240 hour(s))  MRSA Next Gen by PCR, Nasal      Status: None   Collection Time: 02/11/22  1:50 AM   Specimen: Nasal Mucosa; Nasal Swab  Result Value Ref Range Status   MRSA by PCR Next Gen NOT DETECTED NOT DETECTED Final    Comment: (NOTE) The GeneXpert MRSA Assay (FDA approved for NASAL specimens only), is one component of a comprehensive MRSA colonization surveillance program. It is not intended to diagnose MRSA infection nor to guide or monitor treatment for MRSA infections. Test performance is not FDA approved in patients less than 45 years old. Performed at [facility], [place] [address], [place], [place] [address]   Culture, Urine (Do not remove urinary catheter, catheter placed by urology or difficult to place)     Status: Abnormal   Collection Time: 02/11/22  1:51 PM   Specimen: Urine, Catheterized  Result Value Ref Range Status   Specimen Description URINE, CATHETERIZED  Final   Special Requests   Final    NONE Performed at [facility], [place] [address], [place], [place] [address]    Culture 30,000 COLONIES/mL PSEUDOMONAS PUTIDA (A)  Final   Report Status 02/25/2022 FINAL  Final   Organism ID, Bacteria PSEUDOMONAS PUTIDA (A)  Final      Susceptibility   Pseudomonas putida - MIC*    CEFTAZIDIME 2 SENSITIVE Sensitive     CIPROFLOXACIN <=0.25 SENSITIVE Sensitive     GENTAMICIN <=1 SENSITIVE Sensitive     IMIPENEM 0.5 SENSITIVE Sensitive     PIP/TAZO 8 SENSITIVE Sensitive     * 30,000 COLONIES/mL PSEUDOMONAS PUTIDA     VAS [place] LOWER EXTREMITY VENOUS (DVT)  Result Date: 02/16/2022  Lower Venous DVT Study Patient Name:  SHLOMO SERES  Date of Exam:   02/16/2022 Medical Rec #: [id number]         Accession #:    [id number] Date of Birth: January 30, 1946         Patient Gender: M Patient Age:   51 years Exam Location:  [facility] Procedure:      VAS [place] LOWER EXTREMITY VENOUS (DVT) Referring Phys: Vance Gather --------------------------------------------------------------------------------  Indications: Edema.   Comparison Study: no prior Performing Technologist: Archie Patten RVS  Examination Guidelines: A complete evaluation includes B-mode imaging, spectral Doppler, color Doppler, and power Doppler as needed of all accessible portions of each vessel. Bilateral testing is considered an integral part of a complete examination. Limited examinations for reoccurring indications may be performed as noted. The reflux portion of the exam is performed  with the patient in reverse Trendelenburg.  +---------+---------------+---------+-----------+----------+--------------+ RIGHT    CompressibilityPhasicitySpontaneityPropertiesThrombus Aging +---------+---------------+---------+-----------+----------+--------------+ CFV      Full           Yes      Yes                                 +---------+---------------+---------+-----------+----------+--------------+ SFJ      Full                                                        +---------+---------------+---------+-----------+----------+--------------+ FV Prox  Full                                                        +---------+---------------+---------+-----------+----------+--------------+ FV Mid   Full                                                        +---------+---------------+---------+-----------+----------+--------------+ FV DistalFull                                                        +---------+---------------+---------+-----------+----------+--------------+ PFV      Full                                                        +---------+---------------+---------+-----------+----------+--------------+ POP      Full           Yes      Yes                                 +---------+---------------+---------+-----------+----------+--------------+ PTV      Full                                                        +---------+---------------+---------+-----------+----------+--------------+ PERO     Full                                                         +---------+---------------+---------+-----------+----------+--------------+   +---------+---------------+---------+-----------+----------+--------------+ LEFT     CompressibilityPhasicitySpontaneityPropertiesThrombus Aging +---------+---------------+---------+-----------+----------+--------------+ CFV      Full           Yes      Yes                                 +---------+---------------+---------+-----------+----------+--------------+  SFJ      Full                                                        +---------+---------------+---------+-----------+----------+--------------+ FV Prox  Full                                                        +---------+---------------+---------+-----------+----------+--------------+ FV Mid   Full                                                        +---------+---------------+---------+-----------+----------+--------------+ FV DistalFull                                                        +---------+---------------+---------+-----------+----------+--------------+ PFV      Full                                                        +---------+---------------+---------+-----------+----------+--------------+ POP      Full           Yes      Yes                                 +---------+---------------+---------+-----------+----------+--------------+ PTV      Full                                                        +---------+---------------+---------+-----------+----------+--------------+ PERO     Full                                                        +---------+---------------+---------+-----------+----------+--------------+     Summary: BILATERAL: - No evidence of deep vein thrombosis seen in the lower extremities, bilaterally. -No evidence of popliteal cyst, bilaterally.   *See table(s) above for measurements and observations.  Electronically signed by [name] MD on 02/16/2022 at 7:18:05 PM.    Final      Family Communication: none at bedside  Disposition: Status is: Inpatient Remains inpatient appropriate because: Persistent/worsening hyperbilirubinemia, iv tx Planned Discharge Destination: Home with Home Health  Time spent: 83mn  SNolberto Hanlon MD 02/18/2022 9:30 AM Page by [email or web address]

## 2022-02-18 NOTE — Progress Notes (Addendum)
Received patient in bed to unit.  Alert and oriented.  Informed consent signed and in  chart.   Treatment initiated: 1436 Treatment completed: 1826  Patient tolerated well.  Transported back to the room  alert, without acute distress.  Hand-off given to patient's nurse by HD staff nurse   Access used: HD catheter right chest Access issues: small amount bleeding at site  Total UF removed: 2.5 liters Medication(s) given: vancomycin Post HD VS: 116 49 HR 76 sat 100% 2 liters nasal cannula RR 16 Temp oral 98.3 Post HD weight: 55.8 kg   [facility]

## 2022-02-18 NOTE — Progress Notes (Signed)
Patient ID: Adam Reid, male   DOB: 1946/05/24, 76 y.o.   MRN: [id number] S:No complaints. O:BP (!) 113/54 (BP Location: Left Arm)   Pulse 71   Temp 97.6 F (36.4 C) (Oral)   Resp 20   Ht '5\' 6"'$  (1.676 m)   Wt 62.4 kg   SpO2 97%   BMI 22.20 kg/m   Intake/Output Summary (Last 24 hours) at 02/18/2022 0945 Last data filed at 02/17/2022 2300 Gross per 24 hour  Intake 130 ml  Output 0 ml  Net 130 ml   Intake/Output: I/O last 3 completed shifts: In: 330 [P.O.:120; I.V.:10; IV Piggyback:200] Out: 50 [Urine:50]  Intake/Output this shift:  No intake/output data recorded. Weight change:  Gen: NAD CVS: RRR Resp:CTA Abd: +BS, soft, NT/ND Ext: 1+ pretibial edema, RUE AVG +T/B  Recent Labs  Lab 02/12/22 0607 02/16/2022 0349 02/14/22 0341 02/15/22 0052 02/16/22 0123 02/17/22 0049 02/18/22 0209  NA 133* 133* 132* 134* 134* 132* 132*  K 4.1 4.8 4.3 4.7 3.9 4.2 4.3  CL 93* 93* 93* 94* 95* 95* 94*  CO2 '26 24 24 24 26 '$ 21* 23  GLUCOSE 121* 120* 116* 137* 218* 183* 152*  BUN 55* 91* 51* 72* 34* 51* 62*  CREATININE 2.62* 3.70* 2.55* 3.38* 2.04* 3.02* 4.03*  ALBUMIN 2.5*  2.5* 2.5* 2.4* 2.4* 2.4* 2.3* 2.3*  CALCIUM 8.1* 7.8* 7.5* 7.6* 7.7* 7.5* 7.3*  PHOS 6.1*  --   --   --   --   --   --   AST 3,070* 3,065* 1,180* 573* 310* 206* 212*  ALT 2,150* 1,045* 667* 469* 257* 134* 97*   Liver Function Tests: Recent Labs  Lab 02/16/22 0123 02/17/22 0049 02/18/22 0209  AST 310* 206* 212*  ALT 257* 134* 97*  ALKPHOS 1,205* 1,251* 1,471*  BILITOT 7.9* 8.4* 9.5*  PROT 5.3* 4.9* 5.1*  ALBUMIN 2.4* 2.3* 2.3*   No results for input(s): "LIPASE", "AMYLASE" in the last 168 hours. No results for input(s): "AMMONIA" in the last 168 hours. CBC: Recent Labs  Lab 02/14/22 0630 02/15/22 0052 02/16/22 0123 02/17/22 0049 02/18/22 0209  WBC 15.9* 15.4* 16.6* 15.6* 15.2*  HGB 7.1* 7.0* 8.9* 8.7* 8.4*  HCT 21.5* 21.0* 25.6* 25.7* 24.9*  MCV 88.8 89.7 88.6 91.1 92.2  PLT 109* 106* 107* 98*  101*   Cardiac Enzymes: No results for input(s): "CKTOTAL", "CKMB", "CKMBINDEX", "TROPONINI" in the last 168 hours. CBG: Recent Labs  Lab 02/17/22 0720 02/17/22 1130 02/17/22 1555 02/17/22 2009 02/17/22 2328  GLUCAP 148* 179* 141* 125* 141*    Iron Studies: No results for input(s): "IRON", "TIBC", "TRANSFERRIN", "FERRITIN" in the last 72 hours. Studies/Results: VAS [place] LOWER EXTREMITY VENOUS (DVT)  Result Date: 02/16/2022  Lower Venous DVT Study Patient Name:  Adam Reid  Date of Exam:   02/16/2022 Medical Rec #: [id number]         Accession #:    [id number] Date of Birth: January 03, 1946         Patient Gender: M Patient Age:   24 years Exam Location:  [facility] Procedure:      VAS [place] LOWER EXTREMITY VENOUS (DVT) Referring Phys: Vance Gather --------------------------------------------------------------------------------  Indications: Edema.  Comparison Study: no prior Performing Technologist: Archie Patten RVS  Examination Guidelines: A complete evaluation includes B-mode imaging, spectral Doppler, color Doppler, and power Doppler as needed of all accessible portions of each vessel. Bilateral testing is considered an integral part of a complete examination. Limited examinations for reoccurring indications may  be performed as noted. The reflux portion of the exam is performed with the patient in reverse Trendelenburg.  +---------+---------------+---------+-----------+----------+--------------+ RIGHT    CompressibilityPhasicitySpontaneityPropertiesThrombus Aging +---------+---------------+---------+-----------+----------+--------------+ CFV      Full           Yes      Yes                                 +---------+---------------+---------+-----------+----------+--------------+ SFJ      Full                                                        +---------+---------------+---------+-----------+----------+--------------+ FV Prox  Full                                                         +---------+---------------+---------+-----------+----------+--------------+ FV Mid   Full                                                        +---------+---------------+---------+-----------+----------+--------------+ FV DistalFull                                                        +---------+---------------+---------+-----------+----------+--------------+ PFV      Full                                                        +---------+---------------+---------+-----------+----------+--------------+ POP      Full           Yes      Yes                                 +---------+---------------+---------+-----------+----------+--------------+ PTV      Full                                                        +---------+---------------+---------+-----------+----------+--------------+ PERO     Full                                                        +---------+---------------+---------+-----------+----------+--------------+   +---------+---------------+---------+-----------+----------+--------------+ LEFT     CompressibilityPhasicitySpontaneityPropertiesThrombus Aging +---------+---------------+---------+-----------+----------+--------------+ CFV      Full  Yes      Yes                                 +---------+---------------+---------+-----------+----------+--------------+ SFJ      Full                                                        +---------+---------------+---------+-----------+----------+--------------+ FV Prox  Full                                                        +---------+---------------+---------+-----------+----------+--------------+ FV Mid   Full                                                        +---------+---------------+---------+-----------+----------+--------------+ FV DistalFull                                                         +---------+---------------+---------+-----------+----------+--------------+ PFV      Full                                                        +---------+---------------+---------+-----------+----------+--------------+ POP      Full           Yes      Yes                                 +---------+---------------+---------+-----------+----------+--------------+ PTV      Full                                                        +---------+---------------+---------+-----------+----------+--------------+ PERO     Full                                                        +---------+---------------+---------+-----------+----------+--------------+     Summary: BILATERAL: - No evidence of deep vein thrombosis seen in the lower extremities, bilaterally. -No evidence of popliteal cyst, bilaterally.   *See table(s) above for measurements and observations. Electronically signed by [name] MD on 02/16/2022 at 7:18:05 PM.    Final     sodium chloride   Intravenous Once   Chlorhexidine Gluconate Cloth  6 each Topical Q0600   darbepoetin (  ARANESP) injection - DIALYSIS  200 mcg Intravenous Q Sat-HD   diltiazem  180 mg Oral Daily   feeding supplement (NEPRO CARB STEADY)  237 mL Oral BID BM   heparin sodium (porcine)       insulin aspart  0-6 Units Subcutaneous TID WC   lidocaine       lidocaine-EPINEPHrine  20 mL Intradermal Once   multivitamin  1 tablet Oral QHS   pantoprazole  40 mg Oral Q0600   polyethylene glycol  17 g Oral BID   sodium chloride flush  3 mL Intravenous Q12H   traZODone  50 mg Oral QHS    BMET    Component Value Date/Time   NA 132 (L) 02/18/2022 0209   NA 137 06/19/2020 0936   K 4.3 02/18/2022 0209   CL 94 (L) 02/18/2022 0209   CO2 23 02/18/2022 0209   GLUCOSE 152 (H) 02/18/2022 0209   BUN 62 (H) 02/18/2022 0209   BUN 51 (H) 06/19/2020 0936   CREATININE 4.03 (H) 02/18/2022 0209   CREATININE 2.68 (H) 03/26/2016 1839   CALCIUM 7.3 (L)  02/18/2022 0209   GFRNONAA 15 (L) 02/18/2022 0209   GFRAA 21 (L) 06/19/2020 0936   CBC    Component Value Date/Time   WBC 15.2 (H) 02/18/2022 0209   RBC 2.70 (L) 02/18/2022 0209   HGB 8.4 (L) 02/18/2022 0209   HGB 9.1 (L) 09/14/2019 1555   HCT 24.9 (L) 02/18/2022 0209   HCT 28.4 (L) 09/14/2019 1555   PLT 101 (L) 02/18/2022 0209   PLT 464 (H) 09/14/2019 1555   MCV 92.2 02/18/2022 0209   MCV 82 09/14/2019 1555   MCH 31.1 02/18/2022 0209   MCHC 33.7 02/18/2022 0209   RDW 19.2 (H) 02/18/2022 0209   RDW 16.4 (H) 09/14/2019 1555   LYMPHSABS 0.3 (L) 02/16/2022 2130   LYMPHSABS 0.6 (L) 10/05/2017 1433   MONOABS 0.4 02/18/2022 2130   EOSABS 0.0 02/21/2022 2130   EOSABS 0.1 10/05/2017 1433   BASOSABS 0.0 02/15/2022 2130   BASOSABS 0.0 10/05/2017 1433    Assessment/Plan:   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  He had temp RIJ HD catheter placed 02/09/2022 and HD 02/11/22.  He has had 4 HD sessions.   He has been set up with outpatient HD at [facility] on MWF schedule (12:15 chair time but will need to be there by 11:30 on first day to fill out paperwork).   Will continue with HD on MWF schedule and will UF as tolerated with HD today.  Off schedule due to issues with HD staff.  Will get back on MWF schedule prior to discharge.  Junctional bradycardia - due to hyperkalemia as well as amiodarone and diltiazem.  Resolved with dialysis. Vascular access - RIJ TDC placed 02/15/22, RUE AVG placed by [name] on 02/08/22.  Went to HD early this morning and RN noted venous cap was missing from HD catheter and was clotted and unable to use.  Safety zone portal initiated and pt to have [employer] replacement today.  Will send blood cultures and start Vanc and continue cefepime after HD for prophylaxis. 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 HTN -  stable Retroperitoneal paraganglioma - to be seen at [facility] as he was deemed too high risk for surgery  here. FTT - due to paraganglioma and progressive CKD.  Continue with protein supplements.    Donetta Potts, MD [facility]

## 2022-02-19 DIAGNOSIS — Z992 Dependence on renal dialysis: Secondary | ICD-10-CM | POA: Diagnosis not present

## 2022-02-19 DIAGNOSIS — N186 End stage renal disease: Secondary | ICD-10-CM | POA: Diagnosis not present

## 2022-02-19 DIAGNOSIS — E875 Hyperkalemia: Secondary | ICD-10-CM | POA: Diagnosis not present

## 2022-02-19 DIAGNOSIS — I5032 Chronic diastolic (congestive) heart failure: Secondary | ICD-10-CM | POA: Diagnosis not present

## 2022-02-19 LAB — GLUCOSE, CAPILLARY
Glucose-Capillary: 134 mg/dL — ABNORMAL HIGH (ref 70–99)
Glucose-Capillary: 123 mg/dL — ABNORMAL HIGH (ref 70–99)
Glucose-Capillary: 154 mg/dL — ABNORMAL HIGH (ref 70–99)
Glucose-Capillary: 168 mg/dL — ABNORMAL HIGH (ref 70–99)

## 2022-02-19 MED ORDER — ORAL CARE MOUTH RINSE: 15.0000 mL | OROMUCOSAL | Status: DC | PRN

## 2022-02-19 NOTE — Progress Notes (Signed)
Progress Note   Patient: Adam Reid EXB:[id number] DOB: 12-28-1945 DOA: 02/26/2022     8 DOS: the patient was seen and examined on 02/19/2022   Brief hospital course: 76 y.o. male with a history of retroperitoneal paraganglioma, stage V CKD, chronic HFpEF, PAF who presented to the ED 8/2 with weakness, leg swelling. He was found to have junctional bradycardia with hyperkalemia, acidosis, uremia, volume overload, and elevated LFTs. Imaging revealed loculated right pleural effusion. Temporary HD catheter was inserted and HD initiated urgently on 8/3. The hospitalization has been complicated by respiratory failure, liver failure and thrombocytopenia, epistaxis, coffee-ground emesis, and anemia requiring transfusions.    8/10 drawsy in bed. Came back from IR had exchange of Rt IJ .  Assessment and Plan: ESRD: New initiation of hemodialysis this admission 8/3, now s/p tunneled HD catheter placed 8/7 by IR, Dr. Earleen Newport. Also has RUE AVG placed 7/31.  - HD 8/7, planning again 8/9.  8/10 s/p Rt IR exchange. Has been setup for outpt HD Continue HD on MWF schedule, HD today Recently noted venous cap missing from HD catheter and was clotted and unable to use. Safety zone portal initiated and pt have TDC replaced as above. Bcx sent and started on vanco and continue cefepime after HD for prophylaxis by nephrology.    Acute on chronic hypoxic respiratory failure: Due to acute on chronic HFpEF due to renal failure. Per pt/spouse, he's been on 2-3L for several months.  - Weaned oxygen to home 2.5L. Imaging thus far with R > L pleural effusion that appears to have been there since 2021. Repeat CXR  with stable bilateral R > L pleural effusions, no new consolidation.  Continue volume management with HD   Liver failure with coagulopathy: Normal morphology on CT without biliary obstruction, dopplers negative, serological work up including  ANA, smooth muscle IgG, IgG, CMV IgM, EBV IgM, Hep A IgM, HBV core AB  has returned negative, GI suspects congestive hepatopathy/initial insult. LFTs have begun showing improvement, though cholestatic pattern remains. No plan for Bx per GI. - Patient's jaundice continues to worsen with rising bilirubin. Given his baseline frail status and high readmission risk, the family agrees to remaining inpatient until this at least stabilizes. AST/ALT seems overall stable, however Alk phos trending up 8/10 holding amiodarone Recheck LFT's in AM   Acute blood loss anemia on chronic anemia of iron deficiency and CKD:  - s/p 3u (8/3, 8/4, 8/7). Still holding anticoagulation.  8/10 continue ESA, transfuse if Hg of 7 or less     Coffee-ground emesis, history of GERD, Barrett's esophagus, small bowel AVMs.  - GI consulted, was planning enteroscopy, cancelled with resolved bleeding and concern for liver failure. GI has signed off without plans for further evaluation.  - Continue PPI - No further bleeding noted at this time.    Thrombocytopenia: Improved s/p 1u 8/4. - Stabilizing level ~ 100k, will monitor. Still unable to resume anticoagulation.   Epistaxis: s/p packing by ENT 8/4, since discontinued. Resolved.   Acute on chronic HFpEF: Still with LE edema, but improved. LVEF 55-60%, stable RV function. - Volume managed by dialysis.    Paroxysmal AFib with junctional bradycardia on arrival which has resolved:  - Continue diltiazem for rate control since no longer junctional. - Amiodarone discontinued with liver failure   Sepsis due to Pseudomonas UTI:  - Continue cefepime (started 8/5). Leukocytosis is persistent, PCT is 7.6, though he is afebrile without focal evidence of alternative infection.   Right AVG wound  dehiscence: Distal wound with mild dehiscence, no evidence of local infection.  - [name] closed wound at bedside 8/8, will follow up in 10-14 days.   Retroperitoneal paraganglioma: BP remains low at this time.  - BP will not allow for cardura resumption,  will continue to monitor, but don't want to further limit UF with HD. - Will follow up with endocrine surgery at [facility] after discharge.    T2DM: HbA1c 7.4%. - Continue SSI   Hyperkalemia, metabolic acidosis, hyponatremia: All improved and continuing to be managed with dialysis.    Severe protein calorie malnutrition:  - Supplement protein. RD consulted.    Leg swelling is persistent: In setting of malignancy, holding anticoagulation, will r/o DVT w/venous U/S.        Subjective: Without complaints  Physical Exam: Vitals:   02/19/22 0300 02/19/22 0427 02/19/22 0811 02/19/22 1154  BP: (!) 118/51  (!) 124/53 (!) 104/53  Pulse: 74  78 77  Resp: _0 Temp: 98.5 F (36.9 C)  97.6 F (36.4 C) 97.7 F (36.5 C)  TempSrc: Oral  Oral Oral  SpO2: 100%  91% 100%  Weight:  52.2 kg    Height:       General exam: Awake, laying in bed, in nad Respiratory system: Normal respiratory effort, no wheezing Cardiovascular system: regular rate, s1, s2 Gastrointestinal system: Soft, nondistended, positive BS Central nervous system: CN2-12 grossly intact, strength intact Extremities: Perfused, no clubbing Skin: Normal skin turgor, no notable skin lesions seen Psychiatry: Mood normal // no visual hallucinations   Data Reviewed:  Labs reviewed: Na 132, K 4.3, Cr 4.03  Family Communication: Pt in room, family not at bedside  Disposition: Status is: Inpatient Remains inpatient appropriate because: Severity of illness  Planned Discharge Destination: Home    Author: Marylu Lund, MD 02/19/2022 3:28 PM  For on call review [email or web address].

## 2022-02-19 NOTE — Progress Notes (Signed)
Physical Therapy Treatment Patient Details Name: Adam Reid MRN: [id number] DOB: [birth year] Today's Date: 02/19/2022   History of Present Illness Pt is a 76 y.o. male presenting 02/24/2022 with generalized weakness and bradycardia. 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 8/7 placement of a right IJ approach tunneled HD catheter. 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    PT Comments    Pt received supine and agreeable to session with slow but steady progress, however pt continues to be limited by general weakness and fatigue. Session focused on gait for increased activity tolerance and strength with pt needing up to min guard with RW for stability and navigation with no overt LOB. Pt requiring cues for breathing techniques and upright posture throughout. Pt spouse present and supportive throughout session. Pt continues to benefit from skilled PT services to progress toward functional mobility goals.    Recommendations for follow up therapy are one component of a multi-disciplinary discharge planning process, led by the attending physician.  Recommendations may be updated based on patient status, additional functional criteria and insurance authorization.  Follow Up Recommendations  Home health PT     Assistance Recommended at Discharge Frequent or constant Supervision/Assistance  Patient can return home with the following A little help with walking and/or transfers;A little help with bathing/dressing/bathroom;Assistance with cooking/housework;Direct supervision/assist for medications management;Assist for transportation;Direct supervision/assist for financial management;Help with stairs or ramp for entrance   Equipment Recommendations  None recommended by PT    Recommendations for Other Services       Precautions / Restrictions Precautions Precautions: Fall;Other (comment) Precaution Comments: Watch SpO2  (pt reports wearing 2L O2 baseline) Restrictions Weight Bearing Restrictions: No     Mobility  Bed Mobility Overal bed mobility: Modified Independent Bed Mobility: Supine to Sit, Sit to Supine     Supine to sit: Min assist, HOB elevated Sit to supine: Min guard   General bed mobility comments: light min assist to elevate trunk to come to sitting, min guard to return to supine    Transfers Overall transfer level: Needs assistance Equipment used: Rolling walker (2 wheels) Transfers: Sit to/from Stand Sit to Stand: Min guard           General transfer comment: repeated cues for hand placement as pt initially attempts to pull on walker standing from bed, min guard for trunk elevation, endorses fatigue    Ambulation/Gait Ambulation/Gait assistance: Min guard Gait Distance (Feet): 144 Feet Assistive device: Rolling walker (2 wheels) Gait Pattern/deviations: Step-through pattern, Decreased stride length, Trunk flexed Gait velocity: Decreased     General Gait Details: slow fatigued gait with RW, no overt LOB, multiple standing rest secondary to fatigue   Stairs             Wheelchair Mobility    Modified Rankin (Stroke Patients Only)       Balance Overall balance assessment: Needs assistance Sitting-balance support: No upper extremity supported, Single extremity supported Sitting balance-Leahy Scale: Fair Sitting balance - Comments: while seated and attempting to doff /donn socks he lost balance posteriorly and right   Standing balance support: Bilateral upper extremity supported Standing balance-Leahy Scale: Poor Standing balance comment: reliant on UE support                            Cognition Arousal/Alertness: Awake/alert Behavior During Therapy: Flat affect Overall Cognitive Status: Within Functional  Limits for tasks assessed                                 General Comments: WFL for simple tasks, spouse present and  interpreting        Exercises      General Comments General comments (skin integrity, edema, etc.): VSS on 2L O2, pt spouse present and encouraging      Pertinent Vitals/Pain Pain Assessment Pain Assessment: Faces Faces Pain Scale: Hurts a little bit Pain Location: general Pain Descriptors / Indicators: Grimacing, Guarding Pain Intervention(s): Monitored during session, Limited activity within patient's tolerance, Repositioned    Home Living                          Prior Function            PT Goals (current goals can now be found in the care plan section) Acute Rehab PT Goals PT Goal Formulation: With patient Time For Goal Achievement: 02/28/22    Frequency    Min 3X/week      PT Plan Current plan remains appropriate    Co-evaluation              AM-PAC PT "6 Clicks" Mobility   Outcome Measure  Help needed turning from your back to your side while in a flat bed without using bedrails?: A Little Help needed moving from lying on your back to sitting on the side of a flat bed without using bedrails?: A Little Help needed moving to and from a bed to a chair (including a wheelchair)?: A Little Help needed standing up from a chair using your arms (e.g., wheelchair or bedside chair)?: A Little Help needed to walk in hospital room?: A Little Help needed climbing 3-5 steps with a railing? : A Lot 6 Click Score: 17    End of Session Equipment Utilized During Treatment: Oxygen Activity Tolerance: Patient tolerated treatment well Patient left: with call bell/phone within reach;in bed;with bed alarm set Nurse Communication: Mobility status PT Visit Diagnosis: Other abnormalities of gait and mobility (R26.89);Muscle weakness (generalized) (M62.81)     Time: 3009-2330 PT Time Calculation (min) (ACUTE ONLY): 20 min  Charges:  $Therapeutic Exercise: 8-22 mins                     Annali Lybrand R. PTA Acute Rehabilitation Services Office:  [place] 02/19/2022, 1:27 PM

## 2022-02-19 NOTE — Plan of Care (Signed)

## 2022-02-19 NOTE — Progress Notes (Signed)
Occupational Therapy Treatment Patient Details Name: Adam Reid MRN: [id number] DOB: Nov 12, 1945 Today's Date: 02/19/2022   History of present illness Pt is a 76 y.o. male presenting 02/22/2022 with generalized weakness and bradycardia. 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 8/7 placement of a right IJ approach tunneled HD catheter. 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   OT comments  This 76 yo male making progress today with bed mobility, ambulation, and basic ADLs. He is overall at min A level given increased time to do tasks from a RW level. He will continue to benefit from acute OT with follow up [place] and 24 hour s/prn A when up on his feet.   Recommendations for follow up therapy are one component of a multi-disciplinary discharge planning process, led by the attending physician.  Recommendations may be updated based on patient status, additional functional criteria and insurance authorization.    Follow Up Recommendations  Home health OT    Assistance Recommended at Discharge Frequent or constant Supervision/Assistance  Patient can return home with the following  A little help with walking and/or transfers;A lot of help with bathing/dressing/bathroom;Assistance with cooking/housework;Help with stairs or ramp for entrance;Assist for transportation;Direct supervision/assist for financial management;Direct supervision/assist for medications management   Equipment Recommendations  None recommended by OT       Precautions / Restrictions Precautions Precautions: Fall Precaution Comments: Watch SpO2 (pt reports wearing 2L O2 baseline) Restrictions Weight Bearing Restrictions: No       Mobility Bed Mobility Overal bed mobility: Modified Independent Bed Mobility: Supine to Sit     Supine to sit: Modified independent (Device/Increase time), HOB elevated          Transfers Overall transfer  level: Needs assistance Equipment used: Rolling walker (2 wheels) Transfers: Sit to/from Stand Sit to Stand: Min assist           General transfer comment: VCs for safe hand placement     Balance Overall balance assessment: Needs assistance Sitting-balance support: No upper extremity supported, Feet supported Sitting balance-Leahy Scale: Good     Standing balance support: Bilateral upper extremity supported, Reliant on assistive device for balance Standing balance-Leahy Scale: Poor                             ADL either performed or assessed with clinical judgement   ADL Overall ADL's : Needs assistance/impaired                     Lower Body Dressing: Minimal assistance;Sit to/from stand   Toilet Transfer: Minimal assistance;Rolling walker (2 wheels) Toilet Transfer Details (indicate cue type and reason): simulated bed>chair 10 feet then 20 feet away>sitting EOB                Extremity/Trunk Assessment Upper Extremity Assessment Upper Extremity Assessment: Generalized weakness            Vision Patient Visual Report: No change from baseline            Cognition Arousal/Alertness: Awake/alert Behavior During Therapy: Flat affect Overall Cognitive Status: Within Functional Limits for tasks assessed  General Comments VSS on 2L, spouse present during session and interpreting    Pertinent Vitals/ Pain       Pain Assessment Pain Assessment: No/denies pain         Frequency  Min 2X/week        Progress Toward Goals  OT Goals(current goals can now be found in the care plan section)  Progress towards OT goals: Progressing toward goals  Acute Rehab OT Goals Patient Stated Goal: to get stronger and go home (wife) OT Goal Formulation: With patient/family Time For Goal Achievement: 03/01/22 Potential to Achieve Goals: Good  Plan Discharge plan remains  appropriate       AM-PAC OT "6 Clicks" Daily Activity     Outcome Measure   Help from another person eating meals?: None Help from another person taking care of personal grooming?: A Little Help from another person toileting, which includes using toliet, bedpan, or urinal?: A Little Help from another person bathing (including washing, rinsing, drying)?: A Little Help from another person to put on and taking off regular upper body clothing?: A Little Help from another person to put on and taking off regular lower body clothing?: A Little 6 Click Score: 19    End of Session Equipment Utilized During Treatment: Gait belt;Rolling walker (2 wheels);Oxygen (2 liters)  OT Visit Diagnosis: Unsteadiness on feet (R26.81);Other abnormalities of gait and mobility (R26.89)   Activity Tolerance Patient tolerated treatment well   Patient Left  (sitting EOB eating lunch with wife present')           Time: 4503-8882 OT Time Calculation (min): 21 min  Charges: OT General Charges $OT Visit: 1 Visit OT Treatments $Self Care/Home Management : 8-22 mins  Golden Circle, OTR/L Acute Rehab Services [facility] [phone] Office [phone]    Almon Register 02/19/2022, 2:19 PM

## 2022-02-19 NOTE — Progress Notes (Signed)
Patient ID: Adam Reid, male   DOB: 01/12/1946, 76 y.o.   MRN: [id number] S: No complaints O:BP (!) 124/53 (BP Location: Left Arm)   Pulse 78   Temp 97.6 F (36.4 C) (Oral)   Resp 16   Ht '5\' 6"'$  (1.676 m)   Wt 52.2 kg   SpO2 91%   BMI 18.57 kg/m   Intake/Output Summary (Last 24 hours) at 02/19/2022 0918 Last data filed at 02/19/2022 0851 Gross per 24 hour  Intake 1105 ml  Output 52.5 ml  Net 1052.5 ml   Intake/Output: I/O last 3 completed shifts: In: 725 [P.O.:720; IV Piggyback:5] Out: 52.5 [Urine:50; Other:2.5]  Intake/Output this shift:  Total I/O In: 500 [P.O.:500] Out: -  Weight change: -5.3 kg Gen: NAD CVS: RRR Resp: CTA Abd: +BS, soft, NT/ND Ext: 1+ pretibial edema BLE, RUE AVG +T/B  Recent Labs  Lab 03/11/2022 0349 02/14/22 0341 02/15/22 0052 02/16/22 0123 02/17/22 0049 02/18/22 0209  NA 133* 132* 134* 134* 132* 132*  K 4.8 4.3 4.7 3.9 4.2 4.3  CL 93* 93* 94* 95* 95* 94*  CO2 '24 24 24 26 '$ 21* 23  GLUCOSE 120* 116* 137* 218* 183* 152*  BUN 91* 51* 72* 34* 51* 62*  CREATININE 3.70* 2.55* 3.38* 2.04* 3.02* 4.03*  ALBUMIN 2.5* 2.4* 2.4* 2.4* 2.3* 2.3*  CALCIUM 7.8* 7.5* 7.6* 7.7* 7.5* 7.3*  AST 3,065* 1,180* 573* 310* 206* 212*  ALT 1,045* 667* 469* 257* 134* 97*   Liver Function Tests: Recent Labs  Lab 02/16/22 0123 02/17/22 0049 02/18/22 0209  AST 310* 206* 212*  ALT 257* 134* 97*  ALKPHOS 1,205* 1,251* 1,471*  BILITOT 7.9* 8.4* 9.5*  PROT 5.3* 4.9* 5.1*  ALBUMIN 2.4* 2.3* 2.3*   No results for input(s): "LIPASE", "AMYLASE" in the last 168 hours. No results for input(s): "AMMONIA" in the last 168 hours. CBC: Recent Labs  Lab 02/14/22 0630 02/15/22 0052 02/16/22 0123 02/17/22 0049 02/18/22 0209  WBC 15.9* 15.4* 16.6* 15.6* 15.2*  HGB 7.1* 7.0* 8.9* 8.7* 8.4*  HCT 21.5* 21.0* 25.6* 25.7* 24.9*  MCV 88.8 89.7 88.6 91.1 92.2  PLT 109* 106* 107* 98* 101*   Cardiac Enzymes: No results for input(s): "CKTOTAL", "CKMB", "CKMBINDEX",  "TROPONINI" in the last 168 hours. CBG: Recent Labs  Lab 02/18/22 0756 02/18/22 1104 02/18/22 1849 02/18/22 1942 02/19/22 0603  GLUCAP 119* 108* 121* 127* 134*    Iron Studies: No results for input(s): "IRON", "TIBC", "TRANSFERRIN", "FERRITIN" in the last 72 hours. Studies/Results: IR Fluoro Guide CV Line Right  Result Date: 02/18/2022 INDICATION: 76 year old male with recent hemodialysis catheter placement, with report of nonfunctional catheter last night. He presents for exchange/troubleshooting EXAM: IMAGE GUIDED EXCHANGE OF TUNNELED HEMODIALYSIS CATHETER MEDICATIONS: None ANESTHESIA/SEDATION: None FLUOROSCOPY: Radiation Exposure Index (as provided by the fluoroscopic device): 1 mGy Kerma COMPLICATIONS: None PROCEDURE: Informed written consent was obtained from the patient with Farsi interpretation after a discussion of the risks, benefits, and alternatives to treatment. Questions regarding the procedure were encouraged and answered. The right neck and chest, including the endo Ling catheter, were prepped with chlorhexidine in a sterile fashion, and a sterile drape was applied covering the operative field. Maximum barrier sterile technique with sterile gowns and gloves were used for the procedure. A timeout was performed prior to the initiation of the procedure. 1% lidocaine was used for local anesthesia. The red and blue port were both aspirated to clear the heparin. A stiff Glidewire was advanced to the IVC. Catheter was removed from the  tract. A new 19 cm tip to cuff catheter was placed on the wire, with the tip at the superior cavoatrial junction, slightly more centrally than the prior. Vigorous aspiration of the catheter ports confirmed adequate function. Final catheter positioning was confirmed and documented with a spot radiographic image. The catheter aspirates and flushes normally. The catheter was flushed with appropriate sodium citrate dwell. The catheter exit site was secured with a  0-Prolene retention suture. Dressings were applied. The patient tolerated the procedure well without immediate post procedural complication. IMPRESSION: Status post routine exchange of right IJ tunneled hemodialysis catheter. Signed, Dulcy Fanny. Nadene Rubins, RPVI [facility] Radiology Electronically Signed   By: Corrie Mckusick D.O.   On: 02/18/2022 10:28    sodium chloride   Intravenous Once   Chlorhexidine Gluconate Cloth  6 each Topical Q0600   darbepoetin (ARANESP) injection - DIALYSIS  200 mcg Intravenous Q Sat-HD   diltiazem  180 mg Oral Daily   feeding supplement (NEPRO CARB STEADY)  237 mL Oral BID BM   insulin aspart  0-6 Units Subcutaneous TID WC   lidocaine-EPINEPHrine  20 mL Intradermal Once   multivitamin  1 tablet Oral QHS   pantoprazole  40 mg Oral Q0600   polyethylene glycol  17 g Oral BID   sodium chloride flush  3 mL Intravenous Q12H   traZODone  50 mg Oral QHS    BMET    Component Value Date/Time   NA 132 (L) 02/18/2022 0209   NA 137 06/19/2020 0936   K 4.3 02/18/2022 0209   CL 94 (L) 02/18/2022 0209   CO2 23 02/18/2022 0209   GLUCOSE 152 (H) 02/18/2022 0209   BUN 62 (H) 02/18/2022 0209   BUN 51 (H) 06/19/2020 0936   CREATININE 4.03 (H) 02/18/2022 0209   CREATININE 2.68 (H) 03/26/2016 1839   CALCIUM 7.3 (L) 02/18/2022 0209   GFRNONAA 15 (L) 02/18/2022 0209   GFRAA 21 (L) 06/19/2020 0936   CBC    Component Value Date/Time   WBC 15.2 (H) 02/18/2022 0209   RBC 2.70 (L) 02/18/2022 0209   HGB 8.4 (L) 02/18/2022 0209   HGB 9.1 (L) 09/14/2019 1555   HCT 24.9 (L) 02/18/2022 0209   HCT 28.4 (L) 09/14/2019 1555   PLT 101 (L) 02/18/2022 0209   PLT 464 (H) 09/14/2019 1555   MCV 92.2 02/18/2022 0209   MCV 82 09/14/2019 1555   MCH 31.1 02/18/2022 0209   MCHC 33.7 02/18/2022 0209   RDW 19.2 (H) 02/18/2022 0209   RDW 16.4 (H) 09/14/2019 1555   LYMPHSABS 0.3 (L) 02/28/2022 2130   LYMPHSABS 0.6 (L) 10/05/2017 1433    MONOABS 0.4 02/23/2022 2130   EOSABS 0.0 02/20/2022 2130   EOSABS 0.1 10/05/2017 1433   BASOSABS 0.0 02/11/2022 2130   BASOSABS 0.0 10/05/2017 1433    Assessment/Plan:   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  He had temp RIJ HD catheter placed 03/06/2022 and HD 02/11/22.  He has had 4 HD sessions.   He has been set up with outpatient HD at [facility] on MWF schedule (12:15 chair time but will need to be there by 11:30 on first day to fill out paperwork).   Off schedule due to issues with HD staff.  Plan for HD tomorrow then get back on MWF on 02/22/22.  Junctional bradycardia - due to hyperkalemia as well as amiodarone and diltiazem.  Resolved with dialysis. Vascular access - RIJ TDC placed 02/15/22, RUE AVG placed by [name] on 02/08/22 c/b wound dehiscence s/p closure on 02/16/22.  Had RIJ [place] replaced 02/18/22 due to missing venous cap and clotted access.  Given IV vanc after HD and is currently on cefepime.   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 HTN - stable Liver failure with coagulopathy - workup negative.  T bili rising but AST/ALT improving. Sepsis due to pseudomonas UTI - on cefepime since 02/09/2022. Retroperitoneal paraganglioma - to be seen at [facility] as he was deemed too high risk for surgery here. FTT - due to paraganglioma and progressive CKD.  Continue with protein supplements.    Donetta Potts, MD [facility]

## 2022-02-20 ENCOUNTER — Inpatient Hospital Stay (HOSPITAL_COMMUNITY): Payer: Medicare Other

## 2022-02-20 DIAGNOSIS — I5032 Chronic diastolic (congestive) heart failure: Secondary | ICD-10-CM | POA: Diagnosis not present

## 2022-02-20 DIAGNOSIS — R17 Unspecified jaundice: Secondary | ICD-10-CM

## 2022-02-20 DIAGNOSIS — R7989 Other specified abnormal findings of blood chemistry: Secondary | ICD-10-CM

## 2022-02-20 DIAGNOSIS — D447 Neoplasm of uncertain behavior of aortic body and other paraganglia: Secondary | ICD-10-CM | POA: Diagnosis not present

## 2022-02-20 DIAGNOSIS — Z992 Dependence on renal dialysis: Secondary | ICD-10-CM | POA: Diagnosis not present

## 2022-02-20 DIAGNOSIS — R748 Abnormal levels of other serum enzymes: Secondary | ICD-10-CM

## 2022-02-20 DIAGNOSIS — N186 End stage renal disease: Secondary | ICD-10-CM | POA: Diagnosis not present

## 2022-02-20 LAB — GLUCOSE, CAPILLARY
Glucose-Capillary: 88 mg/dL (ref 70–99)
Glucose-Capillary: 292 mg/dL — ABNORMAL HIGH (ref 70–99)
Glucose-Capillary: 181 mg/dL — ABNORMAL HIGH (ref 70–99)

## 2022-02-20 LAB — COMPREHENSIVE METABOLIC PANEL
ALT: 137 U/L — ABNORMAL HIGH (ref 0–44)
AST: 405 U/L — ABNORMAL HIGH (ref 15–41)
Albumin: 2.2 g/dL — ABNORMAL LOW (ref 3.5–5.0)
Alkaline Phosphatase: 2353 U/L — ABNORMAL HIGH (ref 38–126)
Anion gap: 15 (ref 5–15)
BUN: 49 mg/dL — ABNORMAL HIGH (ref 8–23)
CO2: 25 mmol/L (ref 22–32)
Calcium: 7.6 mg/dL — ABNORMAL LOW (ref 8.9–10.3)
Chloride: 94 mmol/L — ABNORMAL LOW (ref 98–111)
Creatinine, Ser: 3.68 mg/dL — ABNORMAL HIGH (ref 0.61–1.24)
GFR, Estimated: 17 mL/min — ABNORMAL LOW (ref >60)
Glucose, Bld: 141 mg/dL — ABNORMAL HIGH (ref 70–99)
Potassium: 4.1 mmol/L (ref 3.5–5.1)
Sodium: 134 mmol/L — ABNORMAL LOW (ref 135–145)
Total Bilirubin: 13.9 mg/dL — ABNORMAL HIGH (ref 0.3–1.2)
Total Protein: 5.5 g/dL — ABNORMAL LOW (ref 6.5–8.1)

## 2022-02-20 LAB — CBC
HCT: 27.3 % — ABNORMAL LOW (ref 39.0–52.0)
Hemoglobin: 9.1 g/dL — ABNORMAL LOW (ref 13.0–17.0)
MCH: 30.6 pg (ref 26.0–34.0)
MCHC: 33.3 g/dL (ref 30.0–36.0)
MCV: 91.9 fL (ref 80.0–100.0)
Platelets: 81 10*3/uL — ABNORMAL LOW (ref 150–400)
RBC: 2.97 MIL/uL — ABNORMAL LOW (ref 4.22–5.81)
RDW: 21.2 % — ABNORMAL HIGH (ref 11.5–15.5)
WBC: 14.4 10*3/uL — ABNORMAL HIGH (ref 4.0–10.5)
nRBC: 2.5 % — ABNORMAL HIGH (ref 0.0–0.2)

## 2022-02-20 LAB — LIPASE, BLOOD: Lipase: 49 U/L (ref 11–51)

## 2022-02-20 NOTE — Progress Notes (Addendum)
Attending physician's note   I have taken a history, reviewed the chart, and examined the patient. I performed a substantive portion of this encounter, including complete performance of at least one of the key components, in conjunction with the APP. I agree with the APP's note, impression, and recommendations with my edits.   GI service recalled to see patient due to uptrending liver enzymes, to include significantly elevated alkaline phosphatase and uptrending bilirubin.  Spoke with the patient along with his son, Adam Reid, at bedside.  Difficult clinical picture, as the significantly elevated AST/ALT on admission would certainly correlate with congestive hepatopathy.  Liver enzymes were downtrending, and hepatotoxic medications had been weaned.  However, in the last 48 hours, has had uptrending liver enzymes:  - Alkaline phosphatase 1471 --> 2353 - AST/ALT 212/97 --> 405/137 - T. bili 9.5 --> 13.9  INR 1.3, normal mentation.  Son reports no recent confusion.  Elevated GGT at 755, PCT 7.6.  Otherwise normal/negative ANA, ASMA, IgG, CMV, EBV, viral hepatitis panel  Normal RUQ [place] with Doppler on 8/5 and normal-appearing liver on CT A/P on 8/3.  While the AST/ALT are uptrending, it is now dominantly cholestatic pattern.  I did not see any relationship between liver enzyme elevation and his paraganglioma in literature.    - MRCP - Check IgM, IgA, AMA to complete serologic work-up - Trend liver enzymes - INR check with labs tomorrow - Discussed the role/utility of liver biopsy with the patient and his son.  Adam Reid relays that this would likely be more invasive than what the patient would like in terms of GOC moving forward.  Can revisit depending on MRCP - GI service will continue to follow  Adam Heck, DO, [place] [phone] office         Daily Progress Note  Hospital Day: 11  Chief Complaint: elevated liver chemistries  Brief History Adam Reid is a 76 y.o. male  with a pmh not limited to ESRD started on HD this admission, PAF, heart failure with preserved EF, DM, HTN, HLD, BPH, chronic anemia, and thyroid disease. Admitted 8/3 with weakness, BLE swelling, bradycardia, elevated liver chemistries.    Assessment / Plan   # 76 yo male with history of heart failure admitted with ESRD and markedly elevated liver chemistries in mixed pattern but now more cholestatic. Liver appears normal on [place] and non-contrast CT scan . Previously thought to be related to congestive hepatopathy. Labs were improving but now with upward trend of AST / Tbii and alk phos.  Will obtain additional labs today including AMA, IgG, IgA Additionally, MRCP has been ordered to further evaluate liver / bile ducts. Am labs  Depending on clinical course, a liver biopsy may be the next step. However, after discussion with son at bedside I don't know that the patient / his family will won't to proceed with invasive workup.    Subjective   No abdominal pain. Actually feels a little stronger today  Objective   Imaging:  IR Fluoro Guide CV Line Right  Result Date: 02/18/2022 INDICATION: 76 year old male with recent hemodialysis catheter placement, with report of nonfunctional catheter last night. He presents for exchange/troubleshooting EXAM: IMAGE GUIDED EXCHANGE OF TUNNELED HEMODIALYSIS CATHETER MEDICATIONS: None ANESTHESIA/SEDATION: None FLUOROSCOPY: Radiation Exposure Index (as provided by the fluoroscopic device): 1 mGy Kerma COMPLICATIONS: None PROCEDURE: Informed written consent was obtained from the patient with Farsi interpretation after a discussion of the risks, benefits, and alternatives to treatment. Questions regarding the procedure were  encouraged and answered. The right neck and chest, including the endo Ling catheter, were prepped with chlorhexidine in a sterile fashion, and a sterile drape was applied covering the operative field. Maximum barrier sterile technique with sterile  gowns and gloves were used for the procedure. A timeout was performed prior to the initiation of the procedure. 1% lidocaine was used for local anesthesia. The red and blue port were both aspirated to clear the heparin. A stiff Glidewire was advanced to the IVC. Catheter was removed from the tract. A new 19 cm tip to cuff catheter was placed on the wire, with the tip at the superior cavoatrial junction, slightly more centrally than the prior. Vigorous aspiration of the catheter ports confirmed adequate function. Final catheter positioning was confirmed and documented with a spot radiographic image. The catheter aspirates and flushes normally. The catheter was flushed with appropriate sodium citrate dwell. The catheter exit site was secured with a 0-Prolene retention suture. Dressings were applied. The patient tolerated the procedure well without immediate post procedural complication. IMPRESSION: Status post routine exchange of right IJ tunneled hemodialysis catheter. Signed, Dulcy Fanny. Nadene Rubins, RPVI [facility] Radiology Electronically Signed   By: Corrie Mckusick D.O.   On: 02/18/2022 10:28    Lab Results: Recent Labs    02/18/22 0209 02/20/22 0018  WBC 15.2* 14.4*  HGB 8.4* 9.1*  HCT 24.9* 27.3*  PLT 101* 81*   BMET Recent Labs    02/18/22 0209 02/20/22 0018  NA 132* 134*  K 4.3 4.1  CL 94* 94*  CO2 23 25  GLUCOSE 152* 141*  BUN 62* 49*  CREATININE 4.03* 3.68*  CALCIUM 7.3* 7.6*   LFT Recent Labs    02/20/22 0018  PROT 5.5*  ALBUMIN 2.2*  AST 405*  ALT 137*  ALKPHOS 2,353*  BILITOT 13.9*   PT/INR Recent Labs    02/18/22 0209  LABPROT 15.7*  INR 1.3*     Scheduled inpatient medications:   sodium chloride   Intravenous Once   Chlorhexidine Gluconate Cloth  6 each Topical Q0600   darbepoetin (ARANESP) injection - DIALYSIS  200 mcg Intravenous Q Sat-HD   diltiazem  180 mg Oral Daily   feeding supplement (NEPRO  CARB STEADY)  237 mL Oral BID BM   insulin aspart  0-6 Units Subcutaneous TID WC   lidocaine-EPINEPHrine  20 mL Intradermal Once   multivitamin  1 tablet Oral QHS   pantoprazole  40 mg Oral Q0600   polyethylene glycol  17 g Oral BID   sodium chloride flush  3 mL Intravenous Q12H   traZODone  50 mg Oral QHS   Continuous inpatient infusions:   sodium chloride Stopped (02/12/22 1243)   anticoagulant sodium citrate     PRN inpatient medications: bisacodyl, docusate sodium, HYDROmorphone (DILAUDID) injection, lidocaine (PF), ondansetron (ZOFRAN) IV, mouth rinse, mouth rinse, prochlorperazine, senna, sodium chloride flush  Vital signs in last 24 hours: Temp:  [97.6 F (36.4 C)-98.4 F (36.9 C)] 98.4 F (36.9 C) (08/12 1248) Pulse Rate:  [73-87] 84 (08/12 1248) Resp:  [11-25] 19 (08/12 1248) BP: (117-133)/(40-67) 128/61 (08/12 1248) SpO2:  [95 %-100 %] 95 % (08/12 1248) Weight:  [52.7 kg-57.8 kg] 52.7 kg (08/12 1205) Last BM Date : 02/15/22  Intake/Output Summary (Last 24 hours) at 02/20/2022 1520 Last data filed at 02/20/2022 1205 Gross per 24 hour  Intake 240 ml  Output 2.5 ml  Net 237.5 ml     Physical Exam:  General: Alert thin male in NAD Heart:  Regular rate and rhythm. No lower extremity edema Pulmonary: Normal respiratory effort Abdomen: Soft, nondistended, nontender. Normal bowel sounds.  Neurologic: Alert and oriented Psych: Pleasant. Cooperative.    Intake/Output from previous day: 08/11 0701 - 08/12 0700 In: 500 [P.O.:500] Out: -  Intake/Output this shift: Total I/O In: 240 [P.O.:240] Out: 2.5 [Other:2.5]    Principal Problem:   ESRD on dialysis [facility]) Active Problems:   Chronic diastolic heart failure (HCC)   Hypertension   PAF (paroxysmal atrial fibrillation) (HCC)   Liver failure (HCC)   Paraganglioma (HCC)   Protein-calorie malnutrition, severe ([place])   Sepsis due to gram-negative UTI ([place])     LOS: 9 days   Tye Savoy ,NP 02/20/2022,  3:20 PM

## 2022-02-20 NOTE — Progress Notes (Signed)
Progress Note   Patient: Adam Reid [id number] DOB: 1946/07/05 DOA: 03/11/2022     9 DOS: the patient was seen and examined on 02/20/2022   Brief hospital course: 76 y.o. male with a history of retroperitoneal paraganglioma, stage V CKD, chronic HFpEF, PAF who presented to the ED 8/2 with weakness, leg swelling. He was found to have junctional bradycardia with hyperkalemia, acidosis, uremia, volume overload, and elevated LFTs. Imaging revealed loculated right pleural effusion. Temporary HD catheter was inserted and HD initiated urgently on 8/3. The hospitalization has been complicated by respiratory failure, liver failure and thrombocytopenia, epistaxis, coffee-ground emesis, and anemia requiring transfusions.    8/10 drawsy in bed. Came back from IR had exchange of Rt IJ .  Assessment and Plan: ESRD: New initiation of hemodialysis this admission 8/3, now s/p tunneled HD catheter placed 8/7 by IR, Dr. Earleen Newport. Also has RUE AVG placed 7/31.  8/10 s/p Rt IR exchange. Has been setup for outpt HD Continue HD on MWF schedule Recently noted venous cap missing from HD catheter and was clotted and unable to use. Safety zone portal initiated and pt have TDC replaced as above. Bcx sent and started on vanco and continue cefepime after HD for prophylaxis by nephrology.    Acute on chronic hypoxic respiratory failure: Due to acute on chronic HFpEF due to renal failure. Per pt/spouse, he's been on 2-3L for several months.  - Weaned oxygen to home 2.5L. Imaging thus far with R > L pleural effusion that appears to have been there since 2021. Repeat CXR  with stable bilateral R > L pleural effusions, no new consolidation.  Continue volume management with HD   Liver failure with coagulopathy: Normal morphology on CT without biliary obstruction, dopplers negative, serological work up including  ANA, smooth muscle IgG, IgG, CMV IgM, EBV IgM, Hep A IgM, HBV core AB has returned negative, GI suspects  congestive hepatopathy/initial insult. -LFT's initially trending down, however is trending back up again, Alk phos 2353, AST up to 405, ALT 137 -Appreciate GI input. Plan for MRCP, IgM, IgA, AMA to complete serologic w/u -Possible liver biopsy to be considered pending MRCP result -repeat LFT in AM   Acute blood loss anemia on chronic anemia of iron deficiency and CKD:  - s/p 3u (8/3, 8/4, 8/7). Still holding anticoagulation.  8/10 continue ESA, transfuse if Hg of 7 or less -Hgb stable     Coffee-ground emesis, history of GERD, Barrett's esophagus, small bowel AVMs.  - GI consulted, was planning enteroscopy, cancelled with resolved bleeding and concern for liver failure. GI has signed off without plans for further evaluation.  - Continue PPI - No further bleeding noted at this time.    Thrombocytopenia: Improved s/p 1u 8/4. - Stabilizing level ~ 100k, will monitor. Still unable to resume anticoagulation.   Epistaxis: s/p packing by ENT 8/4, since discontinued. Resolved.   Acute on chronic HFpEF: Still with LE edema, but improved. LVEF 55-60%, stable RV function. - Volume managed by dialysis.    Paroxysmal AFib with junctional bradycardia on arrival which has resolved:  - Continue diltiazem for rate control since no longer junctional. - Amiodarone discontinued with liver failure   Sepsis due to Pseudomonas UTI:  - Continue cefepime (started 8/5). Leukocytosis is persistent, PCT is 7.6, though he is afebrile without focal evidence of alternative infection.   Right AVG wound dehiscence: Distal wound with mild dehiscence, no evidence of local infection.  - [name] closed wound at bedside 8/8, will follow  up in 10-14 days.   Retroperitoneal paraganglioma: BP remains low at this time.  - BP will not allow for cardura resumption, will continue to monitor, but don't want to further limit UF with HD. - Will follow up with endocrine surgery at [facility] after discharge.    T2DM: HbA1c  7.4%. - Continue SSI   Hyperkalemia, metabolic acidosis, hyponatremia: All improved and continuing to be managed with dialysis.    Severe protein calorie malnutrition:  - Supplement protein. RD consulted.    Leg swelling is persistent: In setting of malignancy, holding anticoagulation, will r/o DVT w/venous U/S.        Subjective: Seen on HD, without complaints  Physical Exam: Vitals:   02/20/22 1140 02/20/22 1205 02/20/22 1248 02/20/22 1522  BP:  (!) 133/52 128/61 (!) 124/49  Pulse:  87 84 83  Resp:  _0 Temp:  98.3 F (36.8 C) 98.4 F (36.9 C) 98.4 F (36.9 C)  TempSrc:   Oral Oral  SpO2:  98% 95% 97%  Weight: 52.7 kg 52.7 kg    Height:       General exam: Conversant, in no acute distress Respiratory system: normal chest rise, clear, no audible wheezing Cardiovascular system: regular rhythm, s1-s2 Gastrointestinal system: Nondistended, nontender, pos BS Central nervous system: No seizures, no tremors Extremities: No cyanosis, no joint deformities Skin: No rashes, no pallor, jaundice Psychiatry: Affect normal // no auditory hallucinations   Data Reviewed:  Labs reviewed: Na 134, K 4.1, Cr 3.68  Family Communication: Pt in room, family not at bedside  Disposition: Status is: Inpatient Remains inpatient appropriate because: Severity of illness  Planned Discharge Destination: Home    Author: Marylu Lund, MD 02/20/2022 5:34 PM  For on call review [email or web address].

## 2022-02-20 NOTE — Progress Notes (Signed)
[facility] Progress Note  Subjective: pt seen on HD, no c/o's  Vitals:   02/20/22 0958 02/20/22 1000 02/20/22 1030 02/20/22 1100  BP: (!) 126/58 (!) 126/58 (!) 117/55 (!) 129/59  Pulse: 81 80 81 81  Resp: '13 11 12 '$ (!) 25  Temp:      TempSrc:      SpO2: 99% 99% 99% 99%  Weight:      Height:        Exam:  alert, nad, cachectic adult male, pleasant  no jvd  Chest cta bilat  Cor reg no RG  Abd soft ntnd no ascites   Ext trace pretib edema   Alert, NF, ox3    RUE AVG+ t/b, RIJ TDC  NEW ESRD: set up for OP HD at [facility] on MWF 12:15 chair time - last hep B labs: 8/2, non-immune  Summary: CKD V patient f/b Dr Graylon Gunning, recent AVF placement in R arm (the R AVF placed in Feb 2023 failed and on 02/08/22 had a new RUA AVG placed). Pt also recently dx'd w/ L retroperitoneal paraganglioma came to ED w/ progressive fatigue, weakness and SOB. K+ 6.6 and junctional bradycardia. Was treated for hyperkalemia and pt agreed to start dialysis due to life-threatening cardiac instability. CCM placed temp cath and pt was dialyzed. Amio/ diltiazem were held. IR saw him and placed TDC. Pt improved and was CLIP'd to [place] on MWF. Admit complicated by pseudomonas UTI from 8/5. FTT felt to be due to ganglioma.   Assessment/ Plan: New ESRD - pt presented w/ junctional bradycardia and progressive CKD V, started HD on 8/3. Today will be 6th HD session, off schedule today. Resume MWF schedule on Monday.  RUE AVG - placed 7/33, almost 9 weeks old. Using [facility] now.  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 HTN - stable Liver failure with coagulopathy - workup negative.  T bili rising but AST/ALT improving. Sepsis due to pseudomonas UTI - on cefepime since 02/19/2022. Retroperitoneal paraganglioma - to be seen at [facility] as he was deemed too high risk for surgery  here. FTT - due to paraganglioma and progressive CKD.  Continue with protein supplements     Rob Deserai Cansler 02/20/2022, 11:19 AM   Recent Labs  Lab 02/18/22 0209 02/20/22 0018  HGB 8.4* 9.1*  ALBUMIN 2.3* 2.2*  CALCIUM 7.3* 7.6*  CREATININE 4.03* 3.68*  K 4.3 4.1   No results for input(s): "IRON", "TIBC", "FERRITIN" in the last 168 hours. Inpatient medications:  sodium chloride   Intravenous Once   Chlorhexidine Gluconate Cloth  6 each Topical Q0600   darbepoetin (ARANESP) injection - DIALYSIS  200 mcg Intravenous Q Sat-HD   diltiazem  180 mg Oral Daily   feeding supplement (NEPRO CARB STEADY)  237 mL Oral BID BM   insulin aspart  0-6 Units Subcutaneous TID WC   lidocaine-EPINEPHrine  20 mL Intradermal Once   multivitamin  1 tablet Oral QHS   pantoprazole  40 mg Oral Q0600   polyethylene glycol  17 g Oral BID   sodium chloride flush  3 mL Intravenous Q12H   traZODone  50 mg Oral QHS    sodium chloride Stopped (02/12/22 1243)   anticoagulant sodium citrate     bisacodyl, docusate sodium, HYDROmorphone (DILAUDID) injection, lidocaine (PF), ondansetron (ZOFRAN) IV, mouth rinse, mouth  rinse, prochlorperazine, senna, sodium chloride flush

## 2022-02-20 NOTE — Progress Notes (Signed)
Received patient in bed to unit.  Alert and oriented.  Informed consent signed and in  chart.   Treatment initiated: 0810 Treatment completed: 1140  Patient tolerated well.  Transported back to the room  alert, without acute distress.  Hand-off given to patient's nurse.   Access used: Cath Access issues: none  Total UF removed: 2.5L Medication(s) given: Aranesp Post HD VS: 133/52,87,98%%,18,98.3 Post HD weight: 52.7kg   [facility]

## 2022-02-21 DIAGNOSIS — R17 Unspecified jaundice: Secondary | ICD-10-CM | POA: Diagnosis not present

## 2022-02-21 DIAGNOSIS — N186 End stage renal disease: Secondary | ICD-10-CM | POA: Diagnosis not present

## 2022-02-21 DIAGNOSIS — R748 Abnormal levels of other serum enzymes: Secondary | ICD-10-CM | POA: Diagnosis not present

## 2022-02-21 DIAGNOSIS — R7989 Other specified abnormal findings of blood chemistry: Secondary | ICD-10-CM | POA: Diagnosis not present

## 2022-02-21 DIAGNOSIS — Z992 Dependence on renal dialysis: Secondary | ICD-10-CM | POA: Diagnosis not present

## 2022-02-21 LAB — COMPREHENSIVE METABOLIC PANEL
ALT: 130 U/L — ABNORMAL HIGH (ref 0–44)
AST: 349 U/L — ABNORMAL HIGH (ref 15–41)
Albumin: 2 g/dL — ABNORMAL LOW (ref 3.5–5.0)
Alkaline Phosphatase: 2273 U/L — ABNORMAL HIGH (ref 38–126)
Anion gap: 15 (ref 5–15)
BUN: 31 mg/dL — ABNORMAL HIGH (ref 8–23)
CO2: 26 mmol/L (ref 22–32)
Calcium: 7.5 mg/dL — ABNORMAL LOW (ref 8.9–10.3)
Chloride: 94 mmol/L — ABNORMAL LOW (ref 98–111)
Creatinine, Ser: 2.59 mg/dL — ABNORMAL HIGH (ref 0.61–1.24)
GFR, Estimated: 25 mL/min — ABNORMAL LOW (ref >60)
Glucose, Bld: 142 mg/dL — ABNORMAL HIGH (ref 70–99)
Potassium: 3.3 mmol/L — ABNORMAL LOW (ref 3.5–5.1)
Sodium: 135 mmol/L (ref 135–145)
Total Bilirubin: 13.9 mg/dL — ABNORMAL HIGH (ref 0.3–1.2)
Total Protein: 5 g/dL — ABNORMAL LOW (ref 6.5–8.1)

## 2022-02-21 LAB — CBC
HCT: 25.5 % — ABNORMAL LOW (ref 39.0–52.0)
Hemoglobin: 8.9 g/dL — ABNORMAL LOW (ref 13.0–17.0)
MCH: 31.6 pg (ref 26.0–34.0)
MCHC: 34.9 g/dL (ref 30.0–36.0)
MCV: 90.4 fL (ref 80.0–100.0)
Platelets: 82 10*3/uL — ABNORMAL LOW (ref 150–400)
RBC: 2.82 MIL/uL — ABNORMAL LOW (ref 4.22–5.81)
RDW: 22.2 % — ABNORMAL HIGH (ref 11.5–15.5)
WBC: 17.1 10*3/uL — ABNORMAL HIGH (ref 4.0–10.5)
nRBC: 2.3 % — ABNORMAL HIGH (ref 0.0–0.2)

## 2022-02-21 LAB — GLUCOSE, CAPILLARY
Glucose-Capillary: 140 mg/dL — ABNORMAL HIGH (ref 70–99)
Glucose-Capillary: 143 mg/dL — ABNORMAL HIGH (ref 70–99)
Glucose-Capillary: 148 mg/dL — ABNORMAL HIGH (ref 70–99)
Glucose-Capillary: 173 mg/dL — ABNORMAL HIGH (ref 70–99)

## 2022-02-21 MED ORDER — POTASSIUM CHLORIDE CRYS ER 20 MEQ PO TBCR
40.0000 meq | EXTENDED_RELEASE_TABLET | Freq: Once | ORAL | Status: AC
Start: 2022-02-21 — End: 2022-02-21
  Administered 2022-02-21: 40 meq via ORAL
  Filled 2022-02-21: qty 2

## 2022-02-21 MED ORDER — CHLORHEXIDINE GLUCONATE CLOTH 2 % EX PADS
6.0000 | MEDICATED_PAD | Freq: Every day | CUTANEOUS | Status: DC
  Administered 2022-02-22 – 2022-03-04 (×11): 6 via TOPICAL

## 2022-02-21 NOTE — Progress Notes (Signed)
[facility] Progress Note  Subjective: pt seen on HD, no c/o's  Vitals:   02/21/22 1200 02/21/22 1204 02/21/22 1559 02/21/22 2029  BP: (!) 107/49 (!) 107/49 (!) 101/44 (!) 116/51  Pulse:  72  74  Resp:  '18 16 20  '$ Temp:  98 F (36.7 C) 98 F (36.7 C) 98.1 F (36.7 C)  TempSrc:  Oral Oral Oral  SpO2:  100% 97% 94%  Weight:      Height:        Exam:  alert, nad, cachectic adult male, pleasant  no jvd  Chest cta bilat  Cor reg no RG  Abd soft ntnd no ascites   Ext trace pretib edema   Alert, NF, ox3    RUE AVG+ t/b, RIJ TDC  NEW ESRD: set up for OP HD at [facility] on MWF 12:15 chair time - last hep B labs: 8/2, non-immune  Summary: CKD V patient f/b Dr Graylon Gunning, recent AVF placement in R arm (the R AVF placed in Feb 2023 failed and on 02/08/22 had a new RUA AVG placed). Pt also recently dx'd w/ L retroperitoneal paraganglioma came to ED w/ progressive fatigue, weakness and SOB. K+ 6.6 and junctional bradycardia. Was treated for hyperkalemia and pt agreed to start dialysis due to life-threatening cardiac instability. CCM placed temp cath and pt was dialyzed. Amio/ diltiazem were held. IR saw him and placed TDC. Pt improved and was CLIP'd to [place] on MWF. Admit complicated by pseudomonas UTI from 8/5. FTT felt to be due to ganglioma.   Assessment/ Plan: New ESRD - pt presented w/ junctional bradycardia and progressive CKD V, started HD on 8/3. Today will be 6th HD session, off schedule today. Resume MWF schedule on Monday.  A/C chronic hypoxic resp failure - wears home O2 2-3 L [place].  RUE AVG - placed 7/52, almost 68 weeks old. Using [facility] now.  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 Volume - euvolemic on exam, cachectic, no edema. CXR w/ R effusion only, no pulm edema. Not getting much fluid off on HD. Continue to UF as tol with HD CKD-BMD - started on phosphate binder. HTN - BP's are on the low, getting cardizem 180 daily for afib. Pt  in NSR now.  PAF - on cardizem for afib. He is in NSR now.  Liver failure with coagulopathy - workup negative.  T bili rising but AST/ALT improving. GI following.  Sepsis due to pseudomonas UTI - on cefepime since 03/06/2022. Retroperitoneal paraganglioma - to be seen at [facility] as he was deemed too high risk for surgery here. FTT - due to paraganglioma and progressive CKD.  Continue with protein supplements     Rob Alechia Lezama 02/21/2022, 10:07 PM   Recent Labs  Lab 02/20/22 0018 02/21/22 0012  HGB 9.1* 8.9*  ALBUMIN 2.2* 2.0*  CALCIUM 7.6* 7.5*  CREATININE 3.68* 2.59*  K 4.1 3.3*    No results for input(s): "IRON", "TIBC", "FERRITIN" in the last 168 hours. Inpatient medications:  sodium chloride   Intravenous Once   Chlorhexidine Gluconate Cloth  6 each Topical Q0600   darbepoetin (ARANESP) injection - DIALYSIS  200 mcg Intravenous Q Sat-HD   diltiazem  180 mg Oral Daily   feeding supplement (NEPRO CARB STEADY)  237 mL Oral BID BM   insulin aspart  0-6 Units Subcutaneous TID WC   lidocaine-EPINEPHrine  20 mL Intradermal Once   multivitamin  1  tablet Oral QHS   pantoprazole  40 mg Oral Q0600   polyethylene glycol  17 g Oral BID   sodium chloride flush  3 mL Intravenous Q12H   traZODone  50 mg Oral QHS    sodium chloride Stopped (02/12/22 1243)   anticoagulant sodium citrate     bisacodyl, docusate sodium, HYDROmorphone (DILAUDID) injection, lidocaine (PF), ondansetron (ZOFRAN) IV, mouth rinse, mouth rinse, prochlorperazine, senna, sodium chloride flush

## 2022-02-21 NOTE — Progress Notes (Signed)
Progress Note   Patient: Adam Reid [id number] DOB: 06-Oct-1945 DOA: 02/25/2022     10 DOS: the patient was seen and examined on 02/21/2022   Brief hospital course: 76 y.o. male with a history of retroperitoneal paraganglioma, stage V CKD, chronic HFpEF, PAF who presented to the ED 8/2 with weakness, leg swelling. He was found to have junctional bradycardia with hyperkalemia, acidosis, uremia, volume overload, and elevated LFTs. Imaging revealed loculated right pleural effusion. Temporary HD catheter was inserted and HD initiated urgently on 8/3. The hospitalization has been complicated by respiratory failure, liver failure and thrombocytopenia, epistaxis, coffee-ground emesis, and anemia requiring transfusions.    8/10 drawsy in bed. Came back from IR had exchange of Rt IJ .  Assessment and Plan: ESRD: New initiation of hemodialysis this admission 8/3, now s/p tunneled HD catheter placed 8/7 by IR, Dr. Earleen Newport. Also has RUE AVG placed 7/31.  8/10 s/p Rt IR exchange. Has been setup for outpt HD Continue HD on MWF schedule Recently noted venous cap missing from HD catheter and was clotted and unable to use. Safety zone portal initiated and pt have TDC replaced as above. Bcx sent and started on vanco and continue cefepime after HD for prophylaxis by nephrology.    Acute on chronic hypoxic respiratory failure: Due to acute on chronic HFpEF due to renal failure. Per pt/spouse, he's been on 2-3L for several months.  - Weaned oxygen to home 2.5L. Imaging thus far with R > L pleural effusion that appears to have been there since 2021. Repeat CXR  with stable bilateral R > L pleural effusions, no new consolidation.  Continue volume management with HD   Liver failure with coagulopathy: Normal morphology on CT without biliary obstruction, dopplers negative, serological work up including  ANA, smooth muscle IgG, IgG, CMV IgM, EBV IgM, Hep A IgM, HBV core AB has returned negative, GI suspects  congestive hepatopathy/initial insult. -LFT's initially trending down, however is trending back up again, Alk phos 2353, AST up to 405, ALT 137 -Appreciate GI input. Plan for MRCP, IgM, IgA, AMA to complete serologic w/u -MRCP reviewed. Findings of hemosiderosis. Recs to continue following LFT trends and if no sig improvement, may benefit from biopsy -Recheck LFT's in AM   Acute blood loss anemia on chronic anemia of iron deficiency and CKD:  - s/p 3u (8/3, 8/4, 8/7). Still holding anticoagulation.  8/10 continue ESA, transfuse if Hg of 7 or less -Hgb remains stable   Coffee-ground emesis, history of GERD, Barrett's esophagus, small bowel AVMs.  - GI consulted, was planning enteroscopy, cancelled with resolved bleeding and concern for liver failure. GI has signed off without plans for further evaluation.  - Continue PPI - No further bleeding noted at this time.    Thrombocytopenia: Improved s/p 1u 8/4. - Plts currently 82   Epistaxis: s/p packing by ENT 8/4, since discontinued. Resolved.   Acute on chronic HFpEF: Still with LE edema, but improved. LVEF 55-60%, stable RV function. - Volume managed by dialysis.    Paroxysmal AFib with junctional bradycardia on arrival which has resolved:  - Continue diltiazem for rate control since no longer junctional. - Amiodarone discontinued with liver failure   Sepsis due to Pseudomonas UTI:  - Continue cefepime (started 8/5). Leukocytosis is persistent, afebrile   Right AVG wound dehiscence: Distal wound with mild dehiscence, no evidence of local infection.  - [name] closed wound at bedside 8/8, will follow up in 10-14 days.   Retroperitoneal paraganglioma: BP remains  low at this time.  - BP will not allow for cardura resumption, will continue to monitor, but don't want to further limit UF with HD. - Will follow up with endocrine surgery at [facility] after discharge.    T2DM: HbA1c 7.4%. - Continue SSI   Hyperkalemia, metabolic acidosis,  hyponatremia: All improved and continuing to be managed with dialysis.    Severe protein calorie malnutrition:  - Supplement protein. RD consulted.    Leg swelling is persistent: In setting of malignancy, holding anticoagulation, will r/o DVT w/venous U/S.        Subjective:No complaints this AM. Adam Reid to go home soon  Physical Exam: Vitals:   02/21/22 0702 02/21/22 0900 02/21/22 1200 02/21/22 1204  BP: (!) 101/45 (!) 105/48 (!) 107/49 (!) 107/49  Pulse: 74   72  Resp: 14   18  Temp: 98 F (36.7 C)   98 F (36.7 C)  TempSrc: Oral   Oral  SpO2: 99%   100%  Weight:      Height:       General exam: Awake, laying in bed, in nad Respiratory system: Normal respiratory effort, no wheezing Cardiovascular system: regular rate, s1, s2 Gastrointestinal system: Soft, nondistended, positive BS Central nervous system: CN2-12 grossly intact, strength intact Extremities: Perfused, no clubbing Skin: Normal skin turgor, jaundice Psychiatry: Mood normal // no visual hallucinations    Data Reviewed:  Labs reviewed: Na 135, Alk phos 2273, AST 349, ALT 130  Family Communication: Pt in room, family not at bedside  Disposition: Status is: Inpatient Remains inpatient appropriate because: Severity of illness  Planned Discharge Destination: Home    Author: Marylu Lund, MD 02/21/2022 3:03 PM  For on call review [email or web address].

## 2022-02-21 NOTE — Progress Notes (Addendum)
Attending physician's note   I have taken a history, reviewed the chart, and examined the patient. I performed a substantive portion of this encounter, including complete performance of at least one of the key components, in conjunction with the APP. I agree with the APP's note, impression, and recommendations with my edits.   Liver enzymes downtrending/plateau, normal lipase.  MRCP with motion artifact and limited views due to lack of gadolinium, but no duct dilation.  Normal-appearing pancreas.  Severe hepatic iron deposition.  I discussed with his son, Adam Reid, by phone today.  Plan to continue trending liver enzymes.  If no appreciable improvement, neck step would be liver biopsy.  He would prefer holding off a day or 2 to reevaluate liver enzyme trend.  As for the hemosiderosis, more likely 2/2 ESA in this CKD patient.  Has had significantly elevated ferritin levels, but this in the setting of treatment and remainder of iron panel c/w iron deficiency.  Dr. Tarri Glenn will assume his inpatient GI care starting tomorrow morning.  [address], DO, FACG [phone] office         Daily Progress Note  Hospital Day: 12  Chief Complaint: elevated liver chemistries  Brief History Adam Reid is a 76 y.o. male with a pmh not limited to ESRD started on HD this admission, PAF, heart failure with preserved EF, DM, HTN, HLD, BPH, chronic anemia, and thyroid disease. Admitted 8/3 with weakness, BLE swelling, bradycardia, elevated liver chemistries.    Assessment / Plan   # 76 yo male with history of heart failure admitted with ESRD and markedly elevated liver chemistries in mixed pattern but now more cholestatic IgG, IgA, AMA pending.  MRCP severely limited without contrast >> hemosiderosis of liver and spleen but no evidence for biliary obstruction. Iron overload / organ deposition possibly related to  erythropoietin stimulating agents if he has been receiving those Mild  improvement in liver chemistries overnight.  Wife in room. Discussed that a liver biopsy would be next step in evaluation. This was also discussed with patient's son yesterday. Wife will discuss with patient and son when he arrives to hospital and they will give [place] their thoughts shortly. However stabilzation and mild improvement in liver chemistries overnight is encouraging.    # ESRD on HD, started this admission   Subjective   Feels okay. Had some loose stool / incontinence last night after miralax  Objective   Imaging:  MR ABDOMEN MRCP WO CONTRAST  Result Date: 02/21/2022 CLINICAL DATA:  76 year old male with history of jaundice. History of retroperitoneal paraganglioma EXAM: MRI ABDOMEN WITHOUT CONTRAST  (INCLUDING MRCP) TECHNIQUE: Multiplanar multisequence MR imaging of the abdomen was performed. Heavily T2-weighted images of the biliary and pancreatic ducts were obtained, and three-dimensional MRCP images were rendered by post processing. COMPARISON:  No prior abdominal MRI. CT of the abdomen and pelvis 02/11/2022. FINDINGS: Comment: Today's study is limited for detection and characterization of visceral and/or vascular lesions by lack of IV gadolinium. Considerable patient motion also limits portions of today's examination. Lower chest: Large right and small left pleural effusions. Near complete atelectasis of the visualized portions of the right lung. Dependent signal intensity in the left lower lobe is also likely to reflect subsegmental atelectasis, but poorly evaluated on today's magnetic resonance examination. Cardiomegaly. Hepatobiliary: There is diffuse loss of signal intensity throughout the hepatic parenchyma on in phase dual echo images, as well as diffuse low signal intensity throughout the hepatic parenchyma on T2 weighted images, indicative  of severe hepatic iron deposition. No discrete cystic or solid hepatic lesions are confidently identified on today's noncontrast examination.  No intra or extrahepatic biliary ductal dilatation noted. MRCP images are essentially nondiagnostic, however, T2 weighted images demonstrate the common bile duct to be 3 mm in the porta hepatis. Gallbladder is unremarkable in appearance. Pancreas: No pancreatic mass. No pancreatic ductal dilatation. No pancreatic or peripancreatic fluid collections or inflammatory changes. Spleen: Diffuse loss of signal intensity throughout the spleen during in phase dual echo images, as well as diffuse low T2 signal intensity in the spleen, indicative of severe splenic iron deposition. Adrenals/Urinary Tract: Multiple renal lesions bilaterally varying in degree of signal intensity and complexity, ranging from T1 hypointense and T2 hyperintense to T1 hyperintense and T2 hypointense, incompletely characterized on today's noncontrast CT examination, but likely a combination of simple and proteinaceous/hemorrhagic cysts. The exception to this is a complex lesion in the upper pole of the right kidney (axial image 19 of series 4) which measures 2.7 cm in diameter which is T1 hyperintense and has T2 hyperintensity non dependently with a dependent nodular appearing area of T2 hypointensity measuring 2.6 x 1.3 cm (axial image 19 of series 4). No hydroureteronephrosis in the visualized portions of the abdomen. Bilateral adrenal glands are unremarkable in appearance. Stomach/Bowel: Visualized portions are unremarkable. Vascular/Lymphatic: No aneurysm identified in the visualized abdominal vasculature. No definite lymphadenopathy confidently identified in the abdomen on today's noncontrast motion limited examination. Other: Large left-sided retroperitoneal mass again noted measuring approximately 6.6 x 5.0 cm on today's study (axial image 25 of series 4) where there is a central area of low T1 and high T2 signal intensity, which may represent some internal necrosis. Trace volume of ascites. Musculoskeletal: No aggressive appearing osseous  lesions are noted in the visualized portions of the skeleton. IMPRESSION: 1. Study is exceedingly limited by lack of IV gadolinium and extensive patient motion. 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 Specifically, no imaging findings to suggest biliary tract obstruction at this time. 2. Large left-sided retroperitoneal mass, similar to prior CT examinations, poorly demonstrated on today's noncontrast study, but compatible with reported paraganglioma. 3. Large right and small left pleural effusions with areas of presumed atelectasis in the lung bases bilaterally, as above. 4. Multiple renal lesions of varying degrees of complexity bilaterally, including a truly indeterminate lesion in the upper pole of the right kidney which measures 2.7 cm with a dependent nodular appearing solid component. Repeat abdominal MRI with and without IV gadolinium should be considered to provide definitive characterization and exclude neoplasm. Electronically Signed   By: Vinnie Langton M.D.   On: 02/21/2022 05:30   IR Fluoro Guide CV Line Right  Result Date: 02/18/2022 INDICATION: 76 year old male with recent hemodialysis catheter placement, with report of nonfunctional catheter last night. He presents for exchange/troubleshooting EXAM: IMAGE GUIDED EXCHANGE OF TUNNELED HEMODIALYSIS CATHETER MEDICATIONS: None ANESTHESIA/SEDATION: None FLUOROSCOPY: Radiation Exposure Index (as provided by the fluoroscopic device): 1 mGy Kerma COMPLICATIONS: None PROCEDURE: Informed written consent was obtained from the patient with Farsi interpretation after a discussion of the risks, benefits, and alternatives to treatment. Questions regarding the procedure were encouraged and answered. The right neck and chest, including the endo Ling catheter, were prepped with chlorhexidine in a sterile fashion, and a  sterile drape was applied covering the operative field. Maximum barrier sterile technique with sterile gowns and  gloves were used for the procedure. A timeout was performed prior to the initiation of the procedure. 1% lidocaine was used for local anesthesia. The red and blue port were both aspirated to clear the heparin. A stiff Glidewire was advanced to the IVC. Catheter was removed from the tract. A new 19 cm tip to cuff catheter was placed on the wire, with the tip at the superior cavoatrial junction, slightly more centrally than the prior. Vigorous aspiration of the catheter ports confirmed adequate function. Final catheter positioning was confirmed and documented with a spot radiographic image. The catheter aspirates and flushes normally. The catheter was flushed with appropriate sodium citrate dwell. The catheter exit site was secured with a 0-Prolene retention suture. Dressings were applied. The patient tolerated the procedure well without immediate post procedural complication. IMPRESSION: Status post routine exchange of right IJ tunneled hemodialysis catheter. Signed, Dulcy Fanny. Nadene Rubins, RPVI [facility] Electronically Signed   By: Corrie Mckusick D.O.   On: 02/18/2022 10:28    Lab Results: Recent Labs    02/20/22 0018 02/21/22 0012  WBC 14.4* 17.1*  HGB 9.1* 8.9*  HCT 27.3* 25.5*  PLT 81* 82*   BMET Recent Labs    02/20/22 0018 02/21/22 0012  NA 134* 135  K 4.1 3.3*  CL 94* 94*  CO2 25 26  GLUCOSE 141* 142*  BUN 49* 31*  CREATININE 3.68* 2.59*  CALCIUM 7.6* 7.5*   LFT Recent Labs    02/21/22 0012  PROT 5.0*  ALBUMIN 2.0*  AST 349*  ALT 130*  ALKPHOS 2,273*  BILITOT 13.9*   PT/INR No results for input(s): "LABPROT", "INR" in the last 72 hours.   Scheduled inpatient medications:   sodium chloride   Intravenous Once   Chlorhexidine Gluconate Cloth  6 each Topical Q0600   darbepoetin (ARANESP) injection  - DIALYSIS  200 mcg Intravenous Q Sat-HD   diltiazem  180 mg Oral Daily   feeding supplement (NEPRO CARB STEADY)  237 mL Oral BID BM   insulin aspart  0-6 Units Subcutaneous TID WC   lidocaine-EPINEPHrine  20 mL Intradermal Once   multivitamin  1 tablet Oral QHS   pantoprazole  40 mg Oral Q0600   polyethylene glycol  17 g Oral BID   sodium chloride flush  3 mL Intravenous Q12H   traZODone  50 mg Oral QHS   Continuous inpatient infusions:   sodium chloride Stopped (02/12/22 1243)   anticoagulant sodium citrate     PRN inpatient medications: bisacodyl, docusate sodium, HYDROmorphone (DILAUDID) injection, lidocaine (PF), ondansetron (ZOFRAN) IV, mouth rinse, mouth rinse, prochlorperazine, senna, sodium chloride flush  Vital signs in last 24 hours: Temp:  [98 F (36.7 C)-98.4 F (36.9 C)] 98 F (36.7 C) (08/13 0702) Pulse Rate:  [74-87] 74 (08/13 0702) Resp:  [11-20] 14 (08/13 0702) BP: (101-133)/(44-61) 101/45 (08/13 0702) SpO2:  [95 %-100 %] 99 % (08/13 0702) Weight:  [52.7 kg-57.1 kg] 57.1 kg (08/13 0501) Last BM Date : 02/20/22  Intake/Output Summary (Last 24 hours) at 02/21/2022 1117 Last data filed at 02/21/2022 0916 Gross per 24 hour  Intake 240 ml  Output 553.5 ml  Net -313.5 ml     Physical Exam:  General: Alert male in NAD Heart:  Regular rate and rhythm. No lower extremity edema Pulmonary: Normal respiratory effort Abdomen: Soft, nondistended, nontender. Normal bowel sounds.  Neurologic: Alert and oriented Psych: Pleasant. Cooperative.    Intake/Output from previous day: 08/12 0701 -  08/13 0700 In: 240 [P.O.:240] Out: 553.5 [Urine:550; Stool:1] Intake/Output this shift: Total I/O In: 240 [P.O.:240] Out: -     Principal Problem:   ESRD on dialysis [facility]) Active Problems:   Chronic diastolic heart failure (HCC)   Hypertension   PAF (paroxysmal atrial fibrillation) (HCC)   Liver failure (HCC)   Paraganglioma (HCC)   Protein-calorie malnutrition,  severe ([place])   Sepsis due to gram-negative UTI ([place])   Elevated LFTs   Elevated bilirubin   Elevated alkaline phosphatase level     LOS: 10 days   Tye Savoy ,NP 02/21/2022, 11:17 AM

## 2022-02-22 DIAGNOSIS — Z992 Dependence on renal dialysis: Secondary | ICD-10-CM | POA: Diagnosis not present

## 2022-02-22 DIAGNOSIS — N189 Chronic kidney disease, unspecified: Secondary | ICD-10-CM | POA: Diagnosis not present

## 2022-02-22 DIAGNOSIS — E875 Hyperkalemia: Secondary | ICD-10-CM | POA: Diagnosis not present

## 2022-02-22 DIAGNOSIS — N186 End stage renal disease: Secondary | ICD-10-CM | POA: Diagnosis not present

## 2022-02-22 LAB — IGG: IgG (Immunoglobin G), Serum: 1058 mg/dL (ref 603–1613)

## 2022-02-22 LAB — COMPREHENSIVE METABOLIC PANEL
ALT: 134 U/L — ABNORMAL HIGH (ref 0–44)
AST: 360 U/L — ABNORMAL HIGH (ref 15–41)
Albumin: 2 g/dL — ABNORMAL LOW (ref 3.5–5.0)
Alkaline Phosphatase: 2274 U/L — ABNORMAL HIGH (ref 38–126)
Anion gap: 15 (ref 5–15)
BUN: 52 mg/dL — ABNORMAL HIGH (ref 8–23)
CO2: 24 mmol/L (ref 22–32)
Calcium: 6.7 mg/dL — ABNORMAL LOW (ref 8.9–10.3)
Chloride: 92 mmol/L — ABNORMAL LOW (ref 98–111)
Creatinine, Ser: 3.68 mg/dL — ABNORMAL HIGH (ref 0.61–1.24)
GFR, Estimated: 17 mL/min — ABNORMAL LOW (ref >60)
Glucose, Bld: 113 mg/dL — ABNORMAL HIGH (ref 70–99)
Potassium: 4.5 mmol/L (ref 3.5–5.1)
Sodium: 131 mmol/L — ABNORMAL LOW (ref 135–145)
Total Bilirubin: 15.9 mg/dL — ABNORMAL HIGH (ref 0.3–1.2)
Total Protein: 4.9 g/dL — ABNORMAL LOW (ref 6.5–8.1)

## 2022-02-22 LAB — IGA: IgA: 315 mg/dL (ref 61–437)

## 2022-02-22 LAB — CBC
HCT: 27.1 % — ABNORMAL LOW (ref 39.0–52.0)
Hemoglobin: 9.2 g/dL — ABNORMAL LOW (ref 13.0–17.0)
MCH: 31 pg (ref 26.0–34.0)
MCHC: 33.9 g/dL (ref 30.0–36.0)
MCV: 91.2 fL (ref 80.0–100.0)
Platelets: 120 10*3/uL — ABNORMAL LOW (ref 150–400)
RBC: 2.97 MIL/uL — ABNORMAL LOW (ref 4.22–5.81)
RDW: 22.5 % — ABNORMAL HIGH (ref 11.5–15.5)
WBC: 17.7 10*3/uL — ABNORMAL HIGH (ref 4.0–10.5)
nRBC: 2.9 % — ABNORMAL HIGH (ref 0.0–0.2)

## 2022-02-22 LAB — GLUCOSE, CAPILLARY
Glucose-Capillary: 86 mg/dL (ref 70–99)
Glucose-Capillary: 86 mg/dL (ref 70–99)
Glucose-Capillary: 83 mg/dL (ref 70–99)
Glucose-Capillary: 211 mg/dL — ABNORMAL HIGH (ref 70–99)
Glucose-Capillary: 107 mg/dL — ABNORMAL HIGH (ref 70–99)

## 2022-02-22 LAB — MITOCHONDRIAL ANTIBODIES: Mitochondrial M2 Ab, IgG: 40.4 Units — ABNORMAL HIGH (ref 0.0–20.0)

## 2022-02-22 LAB — PHOSPHORUS: Phosphorus: 9.9 mg/dL — ABNORMAL HIGH (ref 2.5–4.6)

## 2022-02-22 MED ORDER — ACETAMINOPHEN 325 MG PO TABS
ORAL_TABLET | ORAL | Status: AC
  Filled 2022-02-22: qty 2

## 2022-02-22 NOTE — Progress Notes (Signed)
Progress Note   Patient: Adam Reid WGN:[id number] DOB: 1946/03/26 DOA: 03/09/2022     11 DOS: the patient was seen and examined on 02/22/2022   Brief hospital course: 76 y.o. male with a history of retroperitoneal paraganglioma, stage V CKD, chronic HFpEF, PAF who presented to the ED 8/2 with weakness, leg swelling. He was found to have junctional bradycardia with hyperkalemia, acidosis, uremia, volume overload, and elevated LFTs. Imaging revealed loculated right pleural effusion. Temporary HD catheter was inserted and HD initiated urgently on 8/3. The hospitalization has been complicated by respiratory failure, liver failure and thrombocytopenia, epistaxis, coffee-ground emesis, and anemia requiring transfusions.    8/10 drawsy in bed. Came back from IR had exchange of Rt IJ .  Assessment and Plan: ESRD: New initiation of hemodialysis this admission 8/3, now s/p tunneled HD catheter placed 8/7 by IR, Dr. Earleen Newport. Also has RUE AVG placed 7/31.  8/10 s/p Rt IR exchange. Has been setup for outpt HD Continue HD on MWF schedule Recently noted venous cap missing from HD catheter and was clotted and unable to use. Safety zone portal initiated and pt have TDC replaced as above. Bcx sent and started on vanco and continue cefepime after HD for prophylaxis by nephrology.    Acute on chronic hypoxic respiratory failure: Due to acute on chronic HFpEF due to renal failure. Per pt/spouse, he's been on 2-3L for several months.  - Weaned oxygen to home 2.5L. Imaging thus far with R > L pleural effusion that appears to have been there since 2021. Repeat CXR  with stable bilateral R > L pleural effusions, no new consolidation.  Cont HD as tolerated. Seen on HD today   Liver failure with coagulopathy: Normal morphology on CT without biliary obstruction, dopplers negative, serological work up including  ANA, smooth muscle IgG, IgG, CMV IgM, EBV IgM, Hep A IgM, HBV core AB has returned negative, GI suspects  congestive hepatopathy/initial insult. -LFT's initially trending down, however has trended back up -GI re-consulted and pt underwent MRCP with findings findings of hemosiderosis. Consideration for ?biopsy -LFT's slightly improved this AM -Recheck LFT's in AM   Acute blood loss anemia on chronic anemia of iron deficiency and CKD:  - s/p 3u (8/3, 8/4, 8/7). Still holding anticoagulation.  8/10 continue ESA, transfuse if Hg of 7 or less -Hgb remains stable   Coffee-ground emesis, history of GERD, Barrett's esophagus, small bowel AVMs.  - GI consulted, was planning enteroscopy, cancelled with resolved bleeding and concern for liver failure. GI has signed off without plans for further evaluation.  - Continue PPI - No further bleeding noted at this time.    Thrombocytopenia: Improved s/p 1u 8/4. - Plts up to 120   Epistaxis: s/p packing by ENT 8/4, since discontinued. Resolved.   Acute on chronic HFpEF: Still with LE edema, but improved. LVEF 55-60%, stable RV function. - Volume managed by dialysis.    Paroxysmal AFib with junctional bradycardia on arrival which has resolved:  - Continue diltiazem for rate control since no longer junctional. - Amiodarone discontinued with liver failure   Sepsis due to Pseudomonas UTI:  - Continue cefepime (started 8/5). Leukocytosis is persistent, afebrile   Right AVG wound dehiscence: Distal wound with mild dehiscence, no evidence of local infection.  - [name] closed wound at bedside 8/8, will follow up in 10-14 days.   Retroperitoneal paraganglioma: BP remains low at this time.  - BP will not allow for cardura resumption, will continue to monitor, but don't want  to further limit UF with HD. - Will follow up with endocrine surgery at [facility] after discharge.    T2DM: HbA1c 7.4%. - Continue SSI   Hyperkalemia, metabolic acidosis, hyponatremia: All improved and continuing to be managed with dialysis.    Severe protein calorie malnutrition:  -  Supplement protein. RD consulted.    Leg swelling is persistent: In setting of malignancy, holding anticoagulation, -Recent LE dopplers reviewed, neg for DVT        Subjective:Seen on HD. Without complaints. Eager to go home soon  Physical Exam: Vitals:   02/22/22 1030 02/22/22 1100 02/22/22 1122 02/22/22 1127  BP: (!) 111/48 (!) 108/52 (!) 113/48   Pulse: 73 73 74   Resp: _0 Temp:   98 F (36.7 C)   TempSrc:   Oral   SpO2: 99% 98% 100%   Weight:    53.5 kg  Height:       General exam: Conversant, in no acute distress Respiratory system: normal chest rise, clear, no audible wheezing Cardiovascular system: regular rhythm, s1-s2 Gastrointestinal system: Nondistended, nontender, pos BS Central nervous system: No seizures, no tremors Extremities: No cyanosis, no joint deformities Skin: No rashes, no pallor, jaundice Psychiatry: Affect normal // no auditory hallucinations   Data Reviewed:  Labs reviewed: Na 131, Alk phos 2274, AST 360, ALT 134  Family Communication: Pt in room, family not at bedside  Disposition: Status is: Inpatient Remains inpatient appropriate because: Severity of illness  Planned Discharge Destination: Home    Author: Marylu Lund, MD 02/22/2022 3:16 PM  For on call review [email or web address].

## 2022-02-22 NOTE — Plan of Care (Signed)
  Problem: Education: Goal: Knowledge of General Education information will improve Description: Including pain rating scale, medication(s)/side effects and non-pharmacologic comfort measures Outcome: Progressing   Problem: Health Behavior/Discharge Planning: Goal: Ability to manage health-related needs will improve Outcome: Progressing   Problem: Clinical Measurements: Goal: Ability to maintain clinical measurements within normal limits will improve Outcome: Progressing Goal: Will remain free from infection Outcome: Progressing Goal: Diagnostic test results will improve Outcome: Progressing Goal: Respiratory complications will improve Outcome: Progressing Goal: Cardiovascular complication will be avoided Outcome: Progressing   Problem: Activity: Goal: Risk for activity intolerance will decrease Outcome: Progressing   Problem: Nutrition: Goal: Adequate nutrition will be maintained Outcome: Progressing   Problem: Coping: Goal: Level of anxiety will decrease Outcome: Progressing   Problem: Elimination: Goal: Will not experience complications related to bowel motility Outcome: Progressing Goal: Will not experience complications related to urinary retention Outcome: Progressing   Problem: Pain Managment: Goal: General experience of comfort will improve Outcome: Progressing   Problem: Safety: Goal: Ability to remain free from injury will improve Outcome: Progressing   Problem: Pain Managment: Goal: General experience of comfort will improve Outcome: Progressing   Problem: Safety: Goal: Ability to remain free from injury will improve Outcome: Progressing   Problem: Skin Integrity: Goal: Risk for impaired skin integrity will decrease Outcome: Progressing   Problem: Education: Goal: Ability to describe self-care measures that may prevent or decrease complications (Diabetes Survival Skills Education) will improve Outcome: Progressing Goal: Individualized  Educational Video(s) Outcome: Progressing   Problem: Coping: Goal: Ability to adjust to condition or change in health will improve Outcome: Progressing   Problem: Fluid Volume: Goal: Ability to maintain a balanced intake and output will improve Outcome: Progressing   Problem: Health Behavior/Discharge Planning: Goal: Ability to identify and utilize available resources and services will improve Outcome: Progressing Goal: Ability to manage health-related needs will improve Outcome: Progressing   Problem: Metabolic: Goal: Ability to maintain appropriate glucose levels will improve Outcome: Progressing   Problem: Nutritional: Goal: Maintenance of adequate nutrition will improve Outcome: Progressing Goal: Progress toward achieving an optimal weight will improve Outcome: Progressing   Problem: Skin Integrity: Goal: Risk for impaired skin integrity will decrease Outcome: Progressing

## 2022-02-22 NOTE — Progress Notes (Signed)
PT Cancellation Note  Patient Details Name: Adam Reid MRN: [id number] DOB: 1946-01-04   Cancelled Treatment:    Reason Eval/Treat Not Completed: Other (comment) (Just back from dialysis and asking to walk later. Will return as able.)   Alvira Philips 02/22/2022, 2:23 PM

## 2022-02-22 NOTE — Progress Notes (Signed)
Patients son and spouse would like to speak with MD regarding patient discharge. Son feels that patient would benefit from rehab to get stronger before discharging home with wife.

## 2022-02-22 NOTE — Progress Notes (Signed)
[facility] Progress Note  Subjective: patient seen and examined in room. No complaints/acute events  Vitals:   02/22/22 0408 02/22/22 0421 02/22/22 0716 02/22/22 0745  BP: (!) 99/47  (!) 118/58   Pulse: 65     Resp: 19  (!) 22   Temp: 98.7 F (37.1 C)  98.7 F (37.1 C)   TempSrc: Oral  Oral   SpO2: 94%  96%   Weight:  47.7 kg  56.4 kg  Height:        Exam: Gen: NAD, cachectic CV: RRR Resp: CTA BL Abd: soft, nt/nd Ext: trace edema b/l Les Neuro: awake, alert Dialysis access: RIJ TDC, RUE AVG +b/t   Recent Labs  Lab 02/21/22 0012 02/22/22 0151  HGB 8.9* 9.2*  ALBUMIN 2.0* 2.0*  CALCIUM 7.5* 6.7*  CREATININE 2.59* 3.68*  K 3.3* 4.5   No results for input(s): "IRON", "TIBC", "FERRITIN" in the last 168 hours. Inpatient medications:  sodium chloride   Intravenous Once   Chlorhexidine Gluconate Cloth  6 each Topical Q0600   darbepoetin (ARANESP) injection - DIALYSIS  200 mcg Intravenous Q Sat-HD   diltiazem  180 mg Oral Daily   feeding supplement (NEPRO CARB STEADY)  237 mL Oral BID BM   insulin aspart  0-6 Units Subcutaneous TID WC   lidocaine-EPINEPHrine  20 mL Intradermal Once   multivitamin  1 tablet Oral QHS   pantoprazole  40 mg Oral Q0600   polyethylene glycol  17 g Oral BID   sodium chloride flush  3 mL Intravenous Q12H   traZODone  50 mg Oral QHS    sodium chloride Stopped (02/12/22 1243)   anticoagulant sodium citrate     bisacodyl, docusate sodium, HYDROmorphone (DILAUDID) injection, lidocaine (PF), ondansetron (ZOFRAN) IV, mouth rinse, mouth rinse, prochlorperazine, senna, sodium chloride flush   NEW ESRD: set up for OP HD at [facility] on MWF 12:15 chair time - last hep B labs: 8/2, non-immune  Summary: CKD V patient f/b Dr Graylon Gunning, recent AVF placement in R arm (the R AVF placed in Feb 2023 failed and on 02/08/22 had a new RUA AVG placed). Pt also recently dx'd w/ L retroperitoneal paraganglioma came to ED w/ progressive  fatigue, weakness and SOB. K+ 6.6 and junctional bradycardia. Was treated for hyperkalemia and pt agreed to start dialysis due to life-threatening cardiac instability. CCM placed temp cath and pt was dialyzed. Amio/ diltiazem were held. IR saw him and placed TDC. Pt improved and was CLIP'd to [place] on MWF. Admit complicated by pseudomonas UTI from 8/5. FTT felt to be due to ganglioma.   Assessment/ Plan: New ESRD - pt presented w/ junctional bradycardia and progressive CKD V, started HD on 8/3. Accepted to [facility] on MWF schedule. Will c/w HD on MWF schedule A/C chronic hypoxic resp failure, acute on chronic HFpEF - wears home O2 2-3 L [place]. UF as tolerated RUE AVG - placed 7/83, almost 39 weeks old. Using [facility] now.  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 CKD-BMD - will check phos HTN - BP's are on the low, getting cardizem 180 daily for afib. Pt in NSR now.  PAF - on cardizem for afib. Liver failure with coagulopathy - workup negative.  Per primary and GI Sepsis due to pseudomonas UTI - s/p cefepime Retroperitoneal paraganglioma - to be seen at [facility] as he was deemed too high risk for surgery here. FTT - due to paraganglioma  and progressive CKD.  Continue with protein supplements

## 2022-02-22 NOTE — Progress Notes (Signed)
PT Cancellation Note  Patient Details Name: Adam Reid MRN: [id number] DOB: May 24, 1946   Cancelled Treatment:    Reason Eval/Treat Not Completed: Patient at procedure or test/unavailable (Pt in HD currently. Will return as able.)   Alvira Philips 02/22/2022, 8:58 AM Gerene Nedd M,PT Acute Rehab Services [phone]

## 2022-02-22 NOTE — Plan of Care (Signed)

## 2022-02-23 DIAGNOSIS — E875 Hyperkalemia: Secondary | ICD-10-CM | POA: Diagnosis not present

## 2022-02-23 DIAGNOSIS — N186 End stage renal disease: Secondary | ICD-10-CM | POA: Diagnosis not present

## 2022-02-23 DIAGNOSIS — R748 Abnormal levels of other serum enzymes: Secondary | ICD-10-CM | POA: Diagnosis not present

## 2022-02-23 DIAGNOSIS — Z992 Dependence on renal dialysis: Secondary | ICD-10-CM | POA: Diagnosis not present

## 2022-02-23 DIAGNOSIS — R17 Unspecified jaundice: Secondary | ICD-10-CM | POA: Diagnosis not present

## 2022-02-23 DIAGNOSIS — N189 Chronic kidney disease, unspecified: Secondary | ICD-10-CM | POA: Diagnosis not present

## 2022-02-23 LAB — GLUCOSE, CAPILLARY
Glucose-Capillary: 95 mg/dL (ref 70–99)
Glucose-Capillary: 124 mg/dL — ABNORMAL HIGH (ref 70–99)
Glucose-Capillary: 157 mg/dL — ABNORMAL HIGH (ref 70–99)
Glucose-Capillary: 137 mg/dL — ABNORMAL HIGH (ref 70–99)

## 2022-02-23 LAB — COMPREHENSIVE METABOLIC PANEL
ALT: 125 U/L — ABNORMAL HIGH (ref 0–44)
AST: 312 U/L — ABNORMAL HIGH (ref 15–41)
Albumin: 1.8 g/dL — ABNORMAL LOW (ref 3.5–5.0)
Alkaline Phosphatase: 2386 U/L — ABNORMAL HIGH (ref 38–126)
Anion gap: 15 (ref 5–15)
BUN: 35 mg/dL — ABNORMAL HIGH (ref 8–23)
CO2: 25 mmol/L (ref 22–32)
Calcium: 7 mg/dL — ABNORMAL LOW (ref 8.9–10.3)
Chloride: 94 mmol/L — ABNORMAL LOW (ref 98–111)
Creatinine, Ser: 3.34 mg/dL — ABNORMAL HIGH (ref 0.61–1.24)
GFR, Estimated: 19 mL/min — ABNORMAL LOW (ref >60)
Glucose, Bld: 99 mg/dL (ref 70–99)
Potassium: 3.8 mmol/L (ref 3.5–5.1)
Sodium: 134 mmol/L — ABNORMAL LOW (ref 135–145)
Total Bilirubin: 16.8 mg/dL — ABNORMAL HIGH (ref 0.3–1.2)
Total Protein: 5.1 g/dL — ABNORMAL LOW (ref 6.5–8.1)

## 2022-02-23 LAB — CBC
HCT: 29.2 % — ABNORMAL LOW (ref 39.0–52.0)
Hemoglobin: 9.8 g/dL — ABNORMAL LOW (ref 13.0–17.0)
MCH: 31.1 pg (ref 26.0–34.0)
MCHC: 33.6 g/dL (ref 30.0–36.0)
MCV: 92.7 fL (ref 80.0–100.0)
Platelets: 117 10*3/uL — ABNORMAL LOW (ref 150–400)
RBC: 3.15 MIL/uL — ABNORMAL LOW (ref 4.22–5.81)
RDW: 24.1 % — ABNORMAL HIGH (ref 11.5–15.5)
WBC: 17.4 10*3/uL — ABNORMAL HIGH (ref 4.0–10.5)
nRBC: 3.2 % — ABNORMAL HIGH (ref 0.0–0.2)

## 2022-02-23 LAB — BILIRUBIN, DIRECT: Bilirubin, Direct: 11.4 mg/dL — ABNORMAL HIGH (ref 0.0–0.2)

## 2022-02-23 MED ORDER — SEVELAMER CARBONATE 800 MG PO TABS
800.0000 mg | ORAL_TABLET | Freq: Three times a day (TID) | ORAL | Status: DC
  Administered 2022-02-23 – 2022-02-24 (×3): 800 mg via ORAL
  Filled 2022-02-23 (×3): qty 1

## 2022-02-23 MED ORDER — CALCITRIOL 0.25 MCG PO CAPS
0.2500 ug | ORAL_CAPSULE | ORAL | Status: DC
  Administered 2022-02-24 – 2022-03-03 (×4): 0.25 ug via ORAL
  Filled 2022-02-23 (×6): qty 1

## 2022-02-23 MED ORDER — NEPRO/CARBSTEADY PO LIQD
237.0000 mL | Freq: Two times a day (BID) | ORAL | Status: DC
  Administered 2022-02-25 – 2022-03-01 (×6): 237 mL via ORAL
  Filled 2022-02-23: qty 237

## 2022-02-23 MED ORDER — POLYETHYLENE GLYCOL 3350 17 G PO PACK: 17.0000 g | PACK | Freq: Every day | ORAL | Status: DC | PRN

## 2022-02-23 NOTE — Progress Notes (Signed)
Inpatient Rehab Admissions Coordinator:   Inpatient Rehab Coordinator Note:  I spoke with [name], patient's wife over the phone to discuss CIR recommendations and goals/expectations of CIR stay.  We reviewed 3 hrs/day of therapy, physician follow up, and average length of stay 2 weeks (dependent upon progress) with goals of mod I. Wife is agreeable to pursuing insurance authorization, she verified that she will be able to provide 24/7 supervision to min A level assistance upon patient's discharge back home.  Will move forward with submitting to insurance for approval.       Rehab Admissons Coordinator [place], [place], [place] [phone]

## 2022-02-23 NOTE — Progress Notes (Signed)
Nutrition Follow-up  DOCUMENTATION CODES:   Severe malnutrition in context of chronic illness  INTERVENTION:   Liberalize diet to REGULAR with Fluid Restriction -Ok for family to bring in food from home if this will improve po intake -Elevated phosphorus noted, started on Renvela today; also started on calcitriol for elevated iPTH  Continue Nepro Shake po BID, each supplement provides 425 kcal and 19 grams protein  RD discussed Magic Cup as possible supplement for pt with family but now not pork allergy in the system. RD to follow-up with pt and family regarding this.   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  Being addressed via supplements  GOAL:   Patient will meet greater than or equal to 90% of their needs  Not Met  MONITOR:   PO intake, Supplement acceptance, Labs, Weight trends, I & O's  REASON FOR ASSESSMENT:   Consult Diet education  ASSESSMENT:   76 yo male admitted with symptomatic bradycardia, anemia, hyperkalemia. PMH includes CKD 5, HTN, DM-2, HLD, CHF, GERD, PAF.  7/31 RUE AVG placed 8/03 HD initiated 8/07 tunneled HD cath 8/08 Right AVG wound dehiscence  Noted plan for possible liver biopsy Last iHD yesterday with net UF 2L  Appetite remains poor; family reports pt ate a peach today and 1 date. Pt did not want lunch meal tray. Family reports yesterday pt requested some chicken and rice soup and 1/2 Cheese steak from [employer]. Family reports pt ate the entire half.   Noted pt with retroperitoneal paraganglioma; too high risk for surgery at [facility], followed by [facility]  No recorded po intake since 8/13; recorded po intake 10-100% of meals. Pt drinking some of the Nepro, family pouring over ice which helps the palatability  Phosphorus 9.9 (H), started on Renvela today. iPTH 725,  likely contributing to phosphorus level, noted pt started on calcitriol  to begin tomorrow   Alk Phos 2386 (H) Corrected calcium 9.2, albumin 1.8  Labs: CBGs 86-211 Meds: ss novolog, aranesp, calcitriol, rena-vite   Diet Order:   Diet Order             Diet NPO time specified Except for: Sips with Meds  Diet effective midnight           Diet renal/carb modified with fluid restriction Diet-HS Snack? Nothing; Fluid restriction: 1200 mL Fluid; Room service appropriate? Yes; Fluid consistency: Thin  Diet effective now                   EDUCATION NEEDS:   Education needs have been addressed  Skin:  Skin Assessment: Reviewed RN Assessment  Last BM:  8/14  Height:   Ht Readings from Last 1 Encounters:  02/11/22 5' 6" (1.676 m)    Weight:   Wt Readings from Last 1 Encounters:  02/23/22 50.2 kg    Ideal Body Weight:  64.5 kg  BMI:  Body mass index is 17.86 kg/m.  Estimated Nutritional Needs:   Kcal:  1900-2200  Protein:  85-100 gm  Fluid:  1 L + UOP    Kerman Passey MS, RDN, LDN, CNSC Registered Dietitian 3 Clinical Nutrition RD Pager and On-Call Pager Number Located in [place]

## 2022-02-23 NOTE — Progress Notes (Addendum)
Daily Rounding Note  02/23/2022, 1:16 PM  LOS: 12 days   SUBJECTIVE:   Chief complaint:      Denies abdominal pain.  Appetite is mostly poor though yesterday he had good p.o. intake of food his family brought him from the outside.  Previously constipated but in the last few days he has had loose stools that are hard to control probably about 2 or 3 a day.  OBJECTIVE:         Vital signs in last 24 hours:    Temp:  [97.7 F (36.5 C)-98 F (36.7 C)] 97.9 F (36.6 C) (08/15 1119) Pulse Rate:  [72-79] 73 (08/15 1119) Resp:  [12-20] 14 (08/15 1119) BP: (107-133)/(49-95) 107/49 (08/15 1119) SpO2:  [91 %-100 %] 99 % (08/15 1119) Weight:  [50.2 kg] 50.2 kg (08/15 0615) Last BM Date : 02/22/22 Filed Weights   02/22/22 0745 02/22/22 1127 02/23/22 0615  Weight: 56.4 kg 53.5 kg 50.2 kg   Spoke with patient's wife at bedside who has very good command of [place].  She acted as Engineer, production for her husband. General: Thin, cachectic, severely chronically ill-appearing Heart: RRR Chest: Clear bilaterally with cough. Abdomen: Soft without tenderness.  Thin.  Active bowel sounds. Extremities: Thin limbs. Neuro/Psych: Does not, cooperative.  Intake/Output from previous day: 08/14 0701 - 08/15 0700 In: 480 [P.O.:480] Out: 2000   Intake/Output this shift: No intake/output data recorded.  Lab Results: Recent Labs    02/21/22 0012 02/22/22 0151 02/23/22 0622  WBC 17.1* 17.7* 17.4*  HGB 8.9* 9.2* 9.8*  HCT 25.5* 27.1* 29.2*  PLT 82* 120* 117*   BMET Recent Labs    02/21/22 0012 02/22/22 0151 02/23/22 0622  NA 135 131* 134*  K 3.3* 4.5 3.8  CL 94* 92* 94*  CO2 '26 24 25  '$ GLUCOSE 142* 113* 99  BUN 31* 52* 35*  CREATININE 2.59* 3.68* 3.34*  CALCIUM 7.5* 6.7* 7.0*   LFT Recent Labs    02/21/22 0012 02/22/22 0151 02/23/22 0622  PROT 5.0* 4.9* 5.1*  ALBUMIN 2.0* 2.0* 1.8*  AST 349* 360* 312*  ALT 130* 134*  125*  ALKPHOS 2,273* 2,274* 2,386*  BILITOT 13.9* 15.9* 16.8*   PT/INR No results for input(s): "LABPROT", "INR" in the last 72 hours. Hepatitis Panel No results for input(s): "HEPBSAG", "HCVAB", "HEPAIGM", "HEPBIGM" in the last 72 hours.  Studies/Results: No results found.  Scheduled Meds:  sodium chloride   Intravenous Once   [START ON 02/24/2022] calcitRIOL  0.25 mcg Oral Q M,W,F   Chlorhexidine Gluconate Cloth  6 each Topical Q0600   darbepoetin (ARANESP) injection - DIALYSIS  200 mcg Intravenous Q Sat-HD   diltiazem  180 mg Oral Daily   feeding supplement (NEPRO CARB STEADY)  237 mL Oral BID BM   insulin aspart  0-6 Units Subcutaneous TID WC   lidocaine-EPINEPHrine  20 mL Intradermal Once   multivitamin  1 tablet Oral QHS   pantoprazole  40 mg Oral Q0600   polyethylene glycol  17 g Oral BID   sevelamer carbonate  800 mg Oral TID WC   sodium chloride flush  3 mL Intravenous Q12H   traZODone  50 mg Oral QHS   Continuous Infusions:  sodium chloride Stopped (02/12/22 1243)   PRN Meds:.bisacodyl, docusate sodium, HYDROmorphone (DILAUDID) injection, lidocaine (PF), ondansetron (ZOFRAN) IV, mouth rinse, mouth rinse, prochlorperazine, senna, sodium chloride flush  ASSESMENT:     Elevated LFTs, trend of cholestatic pattern.  Advanced hepatic iron deposition per MRCP.  ? Related to ESA agents received.  LFTs remain significantly elevated.  IgG, IgA normal.  Elevated mitochondrial Ab.      CKD... now ESRD.  Now started on HD this admission.      Elevated INR, overall improving when last assayed 5 days ago.    [place] anemia.  Hgb uptrending.   PLAN     Liver biopsy??     Stop bid Miralax.  Change to prn for now.  All laxatives now are as needed.  If diarrhea continues will need to screen for C. difficile.    Azucena Freed  02/23/2022, 1:16 PM Phone [phone]

## 2022-02-23 NOTE — Progress Notes (Signed)
Progress Note   Patient: Adam Reid FGH:[id number] DOB: December 22, 1945 DOA: 02/16/2022     12 DOS: the patient was seen and examined on 02/23/2022   Brief hospital course: 76 y.o. male with a history of retroperitoneal paraganglioma, stage V CKD, chronic HFpEF, PAF who presented to the ED 8/2 with weakness, leg swelling. He was found to have junctional bradycardia with hyperkalemia, acidosis, uremia, volume overload, and elevated LFTs. Imaging revealed loculated right pleural effusion. Temporary HD catheter was inserted and HD initiated urgently on 8/3. The hospitalization has been complicated by respiratory failure, liver failure and thrombocytopenia, epistaxis, coffee-ground emesis, and anemia requiring transfusions.    8/10 drawsy in bed. Came back from IR had exchange of Rt IJ .  Assessment and Plan: ESRD: New initiation of hemodialysis this admission 8/3, now s/p tunneled HD catheter placed 8/7 by IR, Dr. Earleen Newport. Also has RUE AVG placed 7/31.  8/10 s/p Rt IR exchange. Has been setup for outpt HD Continue HD on MWF schedule Recently TDC was replaced after clotting. On vanco and continue cefepime after HD for prophylaxis by nephrology. Blood cx neg    Acute on chronic hypoxic respiratory failure: Due to acute on chronic HFpEF due to renal failure. Per pt/spouse, he's been on 2-3L for several months.  - Weaned oxygen to home 2.5L. Imaging thus far with R > L pleural effusion that appears to have been there since 2021. Repeat CXR  with stable bilateral R > L pleural effusions, no new consolidation.  Cont HD as tolerated. Breathing comfortably now   Liver failure with coagulopathy: Normal morphology on CT without biliary obstruction, dopplers negative, serological work up including  ANA, smooth muscle IgG, IgG, CMV IgM, EBV IgM, Hep A IgM, HBV core AB has returned negative, GI suspects congestive hepatopathy/initial insult. 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  -AST/ALT slightly improved, however alk phos and total bili slightly worse today -Discussed with pt's son over phone. Pt would be agreeable to liver biopsy if indicated -will f/u with GI recs -Recheck LFT's in AM   Acute blood loss anemia on chronic anemia of iron deficiency and CKD:  - s/p 3u (8/3, 8/4, 8/7). Still holding anticoagulation.  8/10 continue ESA, transfuse if Hg of 7 or less -Hgb remains stable   Coffee-ground emesis, history of GERD, Barrett's esophagus, small bowel AVMs.  - GI consulted, was planning enteroscopy, cancelled with resolved bleeding and concern for liver failure. GI has signed off without plans for further evaluation.  - Continue PPI - No further bleeding noted at this time.    Thrombocytopenia: Improved s/p 1u 8/4. - Plts stable at 117   Epistaxis: s/p packing by ENT 8/4, since discontinued. Resolved.   Acute on chronic HFpEF: Still with LE edema, but improved. LVEF 55-60%, stable RV function. - Volume managed by dialysis.    Paroxysmal AFib with junctional bradycardia on arrival which has resolved:  - Continue diltiazem for rate control since no longer junctional. - Amiodarone discontinued with liver failure   Sepsis due to Pseudomonas UTI:  - Continue cefepime (started 8/5). Leukocytosis is persistent, afebrile   Right AVG wound dehiscence: Distal wound with mild dehiscence, no evidence of local infection.  - [name] closed wound at bedside 8/8, will follow up in 10-14 days.   Retroperitoneal paraganglioma: BP remains low at this time.  - BP will not allow for cardura resumption, will continue to monitor,  but don't want to further limit UF with HD. - Will follow up with endocrine surgery at [facility] after discharge.    T2DM: HbA1c 7.4%. - Continue SSI   Hyperkalemia, metabolic acidosis, hyponatremia: All improved and continuing to be managed with dialysis.    Severe  protein calorie malnutrition:  - Supplement protein. RD consulted.    Leg swelling is persistent: In setting of malignancy, holding anticoagulation, -Recent LE dopplers reviewed, neg for DVT        Subjective:Without complaints. Denies abd pain or any other pain. Eager to go home  Physical Exam: Vitals:   02/23/22 0350 02/23/22 0615 02/23/22 0718 02/23/22 1119  BP: (!) 114/56  (!) 133/95 (!) 107/49  Pulse: 72  77 73  Resp: _0 Temp:   97.8 F (36.6 C) 97.9 F (36.6 C)  TempSrc:   Oral Oral  SpO2: 93%  91% 99%  Weight:  50.2 kg    Height:       General exam: Awake, laying in bed, in nad Respiratory system: Normal respiratory effort, no wheezing Cardiovascular system: regular rate, s1, s2 Gastrointestinal system: Soft, nondistended, positive BS Central nervous system: CN2-12 grossly intact, strength intact Extremities: Perfused, no clubbing Skin: Normal skin turgor, no notable skin lesions seen, jaundice Psychiatry: Mood normal // no visual hallucinations   Data Reviewed:  Labs reviewed: Na 134, Alk phos 2386, AST 312, ALT 125  Family Communication: Pt in room, family not at bedside  Disposition: Status is: Inpatient Remains inpatient appropriate because: Severity of illness  Planned Discharge Destination: Home    Author: Marylu Lund, MD 02/23/2022 1:28 PM  For on call review [email or web address].

## 2022-02-23 NOTE — Progress Notes (Signed)
Occupational Therapy Treatment Patient Details Name: Adam Reid MRN: [id number] DOB: June 16, 1946 Today's Date: 02/23/2022   History of present illness Pt is a 76 y.o. male presenting 02/22/2022 with generalized weakness and bradycardia. 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 8/7 placement of a right IJ approach tunneled HD catheter. 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   OT comments  Patient handoff from PT.  Requires mod assist to power up from EOB with cueing for hand placement (no carryover from PT session), mobility in room with min guard to complete grooming at sink with min guard.  Pt with decreased activity tolerance and endurance.  Utilized Optometrist.  Updated dc plan to AIR to optimize carryover with mobility/ADLs, and decrease burden of care upon dc home.  Will follow.    Recommendations for follow up therapy are one component of a multi-disciplinary discharge planning process, led by the attending physician.  Recommendations may be updated based on patient status, additional functional criteria and insurance authorization.    Follow Up Recommendations  Acute inpatient rehab (3hours/day)    Assistance Recommended at Discharge Frequent or constant Supervision/Assistance  Patient can return home with the following  A little help with walking and/or transfers;A lot of help with bathing/dressing/bathroom;Assistance with cooking/housework;Help with stairs or ramp for entrance;Assist for transportation;Direct supervision/assist for financial management;Direct supervision/assist for medications management   Equipment Recommendations  None recommended by OT    Recommendations for Other Services      Precautions / Restrictions Precautions Precautions: Fall;Other (comment) Precaution Comments: Watch SpO2 (pt reports wearing 2L O2 baseline) Restrictions Weight Bearing Restrictions: No        Mobility Bed Mobility Overal bed mobility: Needs Assistance Bed Mobility: Supine to Sit     Supine to sit: Mod assist Sit to supine: Min guard   General bed mobility comments: mod assist to elevate trunk from bed, returned to supine with min guard    Transfers Overall transfer level: Needs assistance Equipment used: Rolling walker (2 wheels) Transfers: Sit to/from Stand Sit to Stand: Mod assist           General transfer comment: mod assist to power up from EOB cueing for hand placement     Balance Overall balance assessment: Needs assistance Sitting-balance support: No upper extremity supported, Single extremity supported Sitting balance-Leahy Scale: Fair     Standing balance support: Bilateral upper extremity supported, During functional activity Standing balance-Leahy Scale: Poor Standing balance comment: relies on external support, able to engage in grooming with 1 hand support briefly but fatigues easily                           ADL either performed or assessed with clinical judgement   ADL Overall ADL's : Needs assistance/impaired     Grooming: Min guard;Standing;Oral care           Upper Body Dressing : Sitting;Set up   Lower Body Dressing: Moderate assistance;Sit to/from stand Lower Body Dressing Details (indicate cue type and reason): mod assist sit-stand, relies on BUE Support Toilet Transfer: Moderate assistance;Ambulation;Rolling walker (2 wheels) Toilet Transfer Details (indicate cue type and reason): cueing for hand placement, simulated in room         Functional mobility during ADLs: Moderate assistance;Minimal assistance;Rolling walker (2 wheels)      Extremity/Trunk Assessment  Vision       Perception     Praxis      Cognition Arousal/Alertness: Awake/alert Behavior During Therapy: Flat affect Overall Cognitive Status: Within Functional Limits for tasks assessed                                  General Comments: WFL for simple tasks        Exercises      Shoulder Instructions       General Comments VSS on 2L    Pertinent Vitals/ Pain       Pain Assessment Pain Assessment: No/denies pain Pain Intervention(s): Monitored during session  Home Living                                          Prior Functioning/Environment              Frequency  Min 2X/week        Progress Toward Goals  OT Goals(current goals can now be found in the care plan section)  Progress towards OT goals: Progressing toward goals  Acute Rehab OT Goals Patient Stated Goal: get stronger OT Goal Formulation: With patient Time For Goal Achievement: 03/01/22 Potential to Achieve Goals: Good  Plan Frequency remains appropriate;Discharge plan needs to be updated    Co-evaluation                 AM-PAC OT "6 Clicks" Daily Activity     Outcome Measure   Help from another person eating meals?: None Help from another person taking care of personal grooming?: A Little Help from another person toileting, which includes using toliet, bedpan, or urinal?: A Little   Help from another person to put on and taking off regular upper body clothing?: A Little Help from another person to put on and taking off regular lower body clothing?: A Lot 6 Click Score: 15    End of Session Equipment Utilized During Treatment: Rolling walker (2 wheels);Oxygen (2L)  OT Visit Diagnosis: Unsteadiness on feet (R26.81);Other abnormalities of gait and mobility (R26.89)   Activity Tolerance Patient tolerated treatment well   Patient Left in bed;with call bell/phone within reach;with bed alarm set   Nurse Communication Mobility status        Time: 7902-4097 OT Time Calculation (min): 15 min  Charges: OT General Charges $OT Visit: 1 Visit OT Treatments $Self Care/Home Management : 8-22 mins  Jolaine Artist, OT Acute Rehabilitation Services Office  [phone]   Adam Reid 02/23/2022, 10:37 AM

## 2022-02-23 NOTE — Progress Notes (Signed)
[facility] Progress Note  Subjective: patient seen and examined in room. No acute events, tolerated HD yesterday w/ net UF 2L.  Vitals:   02/22/22 2325 02/23/22 0350 02/23/22 0615 02/23/22 0718  BP: (!) 116/53 (!) 114/56  (!) 133/95  Pulse: 79 72  77  Resp: '18 20  20  '$ Temp: 97.9 F (36.6 C)   97.8 F (36.6 C)  TempSrc: Oral   Oral  SpO2: 97% 93%  91%  Weight:   50.2 kg   Height:        Exam: Gen: NAD, cachectic, laying flat in bed CV: RRR Resp: CTA BL Abd: soft, nt/nd Ext: trace edema b/l Les Neuro: awake, alert Dialysis access: RIJ TDC, RUE AVG +b/t   Recent Labs  Lab 02/22/22 0151 02/23/22 0622  HGB 9.2* 9.8*  ALBUMIN 2.0* 1.8*  CALCIUM 6.7* 7.0*  PHOS 9.9*  --   CREATININE 3.68* 3.34*  K 4.5 3.8   No results for input(s): "IRON", "TIBC", "FERRITIN" in the last 168 hours. Inpatient medications:  sodium chloride   Intravenous Once   Chlorhexidine Gluconate Cloth  6 each Topical Q0600   darbepoetin (ARANESP) injection - DIALYSIS  200 mcg Intravenous Q Sat-HD   diltiazem  180 mg Oral Daily   feeding supplement (NEPRO CARB STEADY)  237 mL Oral BID BM   insulin aspart  0-6 Units Subcutaneous TID WC   lidocaine-EPINEPHrine  20 mL Intradermal Once   multivitamin  1 tablet Oral QHS   pantoprazole  40 mg Oral Q0600   polyethylene glycol  17 g Oral BID   sodium chloride flush  3 mL Intravenous Q12H   traZODone  50 mg Oral QHS    sodium chloride Stopped (02/12/22 1243)   bisacodyl, docusate sodium, HYDROmorphone (DILAUDID) injection, lidocaine (PF), ondansetron (ZOFRAN) IV, mouth rinse, mouth rinse, prochlorperazine, senna, sodium chloride flush   NEW ESRD: set up for OP HD at [facility] on MWF 12:15 chair time - last hep B labs: 8/2, non-immune  Summary: CKD V patient f/b Dr Graylon Gunning, recent AVF placement in R arm (the R AVF placed in Feb 2023 failed and on 02/08/22 had a new RUA AVG placed). Pt also recently dx'd w/ L retroperitoneal  paraganglioma came to ED w/ progressive fatigue, weakness and SOB. K+ 6.6 and junctional bradycardia. Was treated for hyperkalemia and pt agreed to start dialysis due to life-threatening cardiac instability. CCM placed temp cath and pt was dialyzed. Amio/ diltiazem were held. IR saw him and placed TDC. Pt improved and was CLIP'd to [place] on MWF. Admit complicated by pseudomonas UTI from 8/5. FTT felt to be due to ganglioma.   Assessment/ Plan: New ESRD - pt presented w/ junctional bradycardia and progressive CKD V, started HD on 8/3. Accepted to [facility] on MWF schedule. Will c/w HD on MWF schedule A/C chronic hypoxic resp failure, acute on chronic HFpEF - wears home O2 2-3 L [place]. UF as tolerated RUE AVG - placed 7/73, almost 17 weeks old. Using [facility] now.  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 PTH high, will start calcitriol HTN - BP's are on the low, getting cardizem 180 daily for afib. Pt in NSR now.  PAF - on cardizem for afib. Liver failure with coagulopathy - workup negative.  Per primary and GI. +hemosiderosis Sepsis due to pseudomonas UTI -  s/p cefepime Retroperitoneal paraganglioma - to be seen at [facility] as he was deemed too high risk for surgery here. FTT - due to paraganglioma and progressive CKD.  Continue with protein supplements

## 2022-02-23 NOTE — Progress Notes (Signed)
Physical Therapy Treatment Patient Details Name: Adam Reid MRN: [id number] DOB: Apr 25, 1946 Today's Date: 02/23/2022   History of Present Illness Pt is a 76 y.o. male presenting 02/11/2022 with generalized weakness and bradycardia. 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 8/7 placement of a right IJ approach tunneled HD catheter. 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    PT Comments    Pt received supine and agreeable to session, however pt continues to be limited by general fatigue, weakness and decreased activity tolerance. Pt able to come to sitting EOB with mod assist to elevate trunk and to assist to scoot to EOB to place feet on floor. Pt needing mod assist to power up to standing with RW for first attempt with cues for safe hand placement, down to min guard at end of session for 5xSTS with pt needing extended seated recovery breaks between repetitions. Pt with increased tolerance for ambulation this session with RW and min guard, without need for standing recovery breaks throughout. OT present at end of session for dovetail session. Discussed with supervising PT and d/c recommendation updated to address deficits, maximize functional independence and safety, and decrease caregiver burden. Pt continues to benefit from skilled PT services to progress toward functional mobility goals.   SpO2 92-96% on 2L [place]   Recommendations for follow up therapy are one component of a multi-disciplinary discharge planning process, led by the attending physician.  Recommendations may be updated based on patient status, additional functional criteria and insurance authorization.  Follow Up Recommendations  Acute inpatient rehab (3hours/day)     Assistance Recommended at Discharge Frequent or constant Supervision/Assistance  Patient can return home with the following A little help with walking and/or transfers;A little help with  bathing/dressing/bathroom;Assistance with cooking/housework;Direct supervision/assist for medications management;Assist for transportation;Direct supervision/assist for financial management;Help with stairs or ramp for entrance   Equipment Recommendations  None recommended by PT    Recommendations for Other Services       Precautions / Restrictions Precautions Precautions: Fall;Other (comment) Precaution Comments: Watch SpO2 (pt reports wearing 2L O2 baseline) Restrictions Weight Bearing Restrictions: No     Mobility  Bed Mobility Overal bed mobility: Modified Independent Bed Mobility: Supine to Sit     Supine to sit: Mod assist     General bed mobility comments: mod assist to elevate trunk and use of pad to assist to scoot to EOB ,seated EOB at end of session for handoff to OT    Transfers Overall transfer level: Needs assistance Equipment used: Rolling walker (2 wheels) Transfers: Sit to/from Stand Sit to Stand: Min guard, Mod assist           General transfer comment: repeated cues for hand placement as pt initially attempts to pull on walker standing from bed, mod assist for first attempt, min guard for 5x STS at end of session, pt needing extended seated recovery between reps    Ambulation/Gait Ambulation/Gait assistance: Min guard Gait Distance (Feet): 145 Feet Assistive device: Rolling walker (2 wheels) Gait Pattern/deviations: Step-through pattern, Decreased stride length, Trunk flexed Gait velocity: Decreased     General Gait Details: slow fatigued gait with RW, no overt LOB, improved endurance with no standing recovery breaks needed this session, cues for elevated trunk and forward gaze   Stairs             Wheelchair Mobility    Modified Rankin (Stroke Patients Only)  Balance Overall balance assessment: Needs assistance Sitting-balance support: No upper extremity supported, Single extremity supported Sitting balance-Leahy Scale:  Fair     Standing balance support: Bilateral upper extremity supported Standing balance-Leahy Scale: Poor Standing balance comment: reliant on UE support                            Cognition Arousal/Alertness: Awake/alert Behavior During Therapy: Flat affect Overall Cognitive Status: Within Functional Limits for tasks assessed                                 General Comments: WFL for simple tasks, spouse present and interpreting        Exercises      General Comments General comments (skin integrity, edema, etc.): VSS on 2L, dovetail with OT at end of session      Pertinent Vitals/Pain Pain Assessment Pain Assessment: No/denies pain Pain Intervention(s): Monitored during session    Home Living                          Prior Function            PT Goals (current goals can now be found in the care plan section) Acute Rehab PT Goals PT Goal Formulation: With patient Time For Goal Achievement: 02/28/22    Frequency    Min 3X/week      PT Plan Discharge plan needs to be updated    Co-evaluation              AM-PAC PT "6 Clicks" Mobility   Outcome Measure  Help needed turning from your back to your side while in a flat bed without using bedrails?: A Little Help needed moving from lying on your back to sitting on the side of a flat bed without using bedrails?: A Lot Help needed moving to and from a bed to a chair (including a wheelchair)?: A Little Help needed standing up from a chair using your arms (e.g., wheelchair or bedside chair)?: A Lot Help needed to walk in hospital room?: A Little Help needed climbing 3-5 steps with a railing? : A Lot 6 Click Score: 15    End of Session Equipment Utilized During Treatment: Oxygen;Gait belt Activity Tolerance: Patient tolerated treatment well;Patient limited by fatigue Patient left: with call bell/phone within reach;in bed (seated EOB with OT present) Nurse Communication:  Mobility status PT Visit Diagnosis: Other abnormalities of gait and mobility (R26.89);Muscle weakness (generalized) (M62.81)     Time: 1829-9371 PT Time Calculation (min) (ACUTE ONLY): 17 min  Charges:  $Therapeutic Exercise: 8-22 mins                    Jozalyn Baglio R. PTA Acute Rehabilitation Services Office: [place] 02/23/2022, 9:24 AM

## 2022-02-24 ENCOUNTER — Inpatient Hospital Stay (HOSPITAL_COMMUNITY)
Admission: RE | Admit: 2022-02-24 | Discharge: 2022-02-24 | Disposition: A | Discharge status: Home / Self Care | Payer: Medicare Other | Source: Ambulatory Visit | Attending: Nephrology | Admitting: Nephrology

## 2022-02-24 ENCOUNTER — Encounter (HOSPITAL_COMMUNITY): Payer: Self-pay | Admitting: Critical Care Medicine

## 2022-02-24 DIAGNOSIS — Z992 Dependence on renal dialysis: Secondary | ICD-10-CM | POA: Diagnosis not present

## 2022-02-24 DIAGNOSIS — N186 End stage renal disease: Secondary | ICD-10-CM | POA: Diagnosis not present

## 2022-02-24 LAB — CBC
HCT: 30.3 % — ABNORMAL LOW (ref 39.0–52.0)
Hemoglobin: 10.4 g/dL — ABNORMAL LOW (ref 13.0–17.0)
MCH: 31.2 pg (ref 26.0–34.0)
MCHC: 34.3 g/dL (ref 30.0–36.0)
MCV: 91 fL (ref 80.0–100.0)
Platelets: 138 10*3/uL — ABNORMAL LOW (ref 150–400)
RBC: 3.33 MIL/uL — ABNORMAL LOW (ref 4.22–5.81)
RDW: 24.2 % — ABNORMAL HIGH (ref 11.5–15.5)
WBC: 15.6 10*3/uL — ABNORMAL HIGH (ref 4.0–10.5)
nRBC: 4.7 % — ABNORMAL HIGH (ref 0.0–0.2)

## 2022-02-24 LAB — COMPREHENSIVE METABOLIC PANEL
ALT: 125 U/L — ABNORMAL HIGH (ref 0–44)
AST: 315 U/L — ABNORMAL HIGH (ref 15–41)
Albumin: 1.9 g/dL — ABNORMAL LOW (ref 3.5–5.0)
Alkaline Phosphatase: 2297 U/L — ABNORMAL HIGH (ref 38–126)
Anion gap: 17 — ABNORMAL HIGH (ref 5–15)
BUN: 52 mg/dL — ABNORMAL HIGH (ref 8–23)
CO2: 22 mmol/L (ref 22–32)
Calcium: 6.3 mg/dL — CL (ref 8.9–10.3)
Chloride: 92 mmol/L — ABNORMAL LOW (ref 98–111)
Creatinine, Ser: 4.03 mg/dL — ABNORMAL HIGH (ref 0.61–1.24)
GFR, Estimated: 15 mL/min — ABNORMAL LOW (ref >60)
Glucose, Bld: 131 mg/dL — ABNORMAL HIGH (ref 70–99)
Potassium: 4.5 mmol/L (ref 3.5–5.1)
Sodium: 131 mmol/L — ABNORMAL LOW (ref 135–145)
Total Bilirubin: 17.2 mg/dL — ABNORMAL HIGH (ref 0.3–1.2)
Total Protein: 5.1 g/dL — ABNORMAL LOW (ref 6.5–8.1)

## 2022-02-24 LAB — GLUCOSE, CAPILLARY
Glucose-Capillary: 100 mg/dL — ABNORMAL HIGH (ref 70–99)
Glucose-Capillary: 120 mg/dL — ABNORMAL HIGH (ref 70–99)
Glucose-Capillary: 126 mg/dL — ABNORMAL HIGH (ref 70–99)
Glucose-Capillary: 174 mg/dL — ABNORMAL HIGH (ref 70–99)

## 2022-02-24 LAB — PROTIME-INR
INR: 1.2 (ref 0.8–1.2)
Prothrombin Time: 15.3 seconds — ABNORMAL HIGH (ref 11.4–15.2)

## 2022-02-24 LAB — MAGNESIUM: Magnesium: 2 mg/dL (ref 1.7–2.4)

## 2022-02-24 MED ORDER — ANTICOAGULANT SODIUM CITRATE 4% (200MG/5ML) IV SOLN
5.0000 mL | Status: DC | PRN
  Administered 2022-02-24: 3.2 mL
  Filled 2022-02-24: qty 3.2
  Filled 2022-02-24: qty 5

## 2022-02-24 MED ORDER — ALTEPLASE 2 MG IJ SOLR: 2.0000 mg | Freq: Once | INTRAMUSCULAR | Status: DC | PRN

## 2022-02-24 MED ORDER — CALCIUM ACETATE (PHOS BINDER) 667 MG PO CAPS
667.0000 mg | ORAL_CAPSULE | Freq: Three times a day (TID) | ORAL | Status: DC
  Administered 2022-02-24 – 2022-03-04 (×19): 667 mg via ORAL
  Filled 2022-02-24 (×21): qty 1

## 2022-02-24 NOTE — Progress Notes (Signed)
PT STABLE IN BED NO C/OS VITALS STABLE FOR HD AVF +/+ DC WNL UFG 3L

## 2022-02-24 NOTE — H&P (Signed)
Chief Complaint: Patient was seen in consultation today for abnormal liver enzymes  at the request of Thornton Park. MD  Referring Physician(s): Thornton Park, MD  Supervising Physician: Sandi Mariscal  Patient Status: [facility] - In-pt  History of Present Illness: Adam Reid is a 76 y.o. male with PMH of retroperitoneal paraganglioma, CKD stage V, chronic HFpEF, and paroxysmal A-fib.  Patient presented to ED 02/20/2022 complaining of leg swelling and weakness.  Patient was found to have junctional bradycardia with hyperkalemia, acidosis, uremia, volume overload and elevated LFTs.  Patient was admitted with sepsis due to Pseudomonas UTI.  Since being admitted patient had temporary HD catheter for urgent hemodialysis 02/11/2022 with IR.  Patient had tunneled HD catheter placed 02/15/2022 and then exchanged 02/18/2022 due to clotting.  Patient has continued to have LFTs trend up down and then back up again.  Patient has been referred to IR by Thornton Park, MD, for random liver biopsy.  Past Medical History:  Diagnosis Date   Anemia    Anxiety    BPH (benign prostatic hyperplasia)    Cancer (HCC)    CHF (congestive heart failure) (HCC)    Chronic kidney disease    Depression    Diabetes mellitus without complication (HCC)    Type II   Dyspnea    ESRD (end stage renal disease) (HCC)    GERD (gastroesophageal reflux disease)    Heart murmur    History of blood transfusion    Hyperlipidemia    Hypertension    PAF (paroxysmal atrial fibrillation) ([place])    Thyroid disease    was on supplement, taken off by [name]    Past Surgical History:  Procedure Laterality Date   A/V FISTULAGRAM Right 01/29/2022   Procedure: A/V Fistulagram;  Surgeon: [name], MD;  Location: [place] [facility];  Service: Cardiovascular;  Laterality: Right;   APPENDECTOMY     AV FISTULA PLACEMENT Right 09/04/2021   Procedure: RIGHT BRACHIOCEPHALIC ARTERIOVENOUS (AV) FISTULA CREATION;   Surgeon: [name], MD;  Location: [place];  Service: Vascular;  Laterality: Right;   [place] Right 02/08/2022   Procedure: RIGHT UPPER ARM GORE-TEX GRAFT INSERTION USING 4-94mm STRETCH GORETEX GRAFT;  Surgeon: [name], MD;  Location: [place];  Service: Vascular;  Laterality: Right;   BIOPSY  12/25/2020   Procedure: BIOPSY;  Surgeon: [name]., MD;  Location: [place];  Service: Gastroenterology;;   CARDIAC CATHETERIZATION N/A 01/30/2016   Procedure: Left Heart Cath and Coronary Angiography;  Surgeon: [name], MD;  Location: [place] [facility];  Service: Cardiovascular;  Laterality: N/A;   ENTEROSCOPY N/A 12/25/2020   Procedure: ENTEROSCOPY;  Surgeon: [name], MD;  Location: [place];  Service: Gastroenterology;  Laterality: N/A;   GIVENS CAPSULE STUDY N/A 12/25/2020   Procedure: GIVENS CAPSULE STUDY;  Surgeon: [name]., MD;  Location: [place];  Service: Gastroenterology;  Laterality: N/A;   IR FLUORO GUIDE CV LINE RIGHT  02/15/2022   IR FLUORO GUIDE CV LINE RIGHT  02/18/2022   IR US GUIDE VASC ACCESS RIGHT  02/15/2022   SUBMUCOSAL TATTOO INJECTION  12/25/2020   Procedure: SUBMUCOSAL TATTOO INJECTION;  Surgeon: [name]., MD;  Location: [facility];  Service: Gastroenterology;;    Allergies: Pork-derived products  Medications: Prior to Admission medications   Medication Sig Start Date End Date Taking? Authorizing Provider  amiodarone (PACERONE) 200 MG tablet Take 1 tablet (200 mg total) by mouth daily. Follow  up appt required for further refills. Please call [phone] to schedule an appt Patient taking differently: Take 200 mg by mouth daily. 11/30/21   Clegg, Amy D, NP  atorvastatin (LIPITOR) 80 MG tablet TAKE 1 TABLET(80 MG) BY MOUTH DAILY Patient taking differently: Take 80 mg by mouth daily. 12/22/20   [name], MD  bisacodyl (DULCOLAX) 10 MG suppository Place 1  suppository (10 mg total) rectally as needed for moderate constipation. 12/31/21   [name], MD  blood glucose meter kit and supplies Use up to two times daily as directed  ICD10 E10.9 E11.9 01/18/22   [name], MD  blood glucose meter kit and supplies Dispense based on patient and insurance preference. Use up to four times daily as directed. (FOR ICD-10 E10.9, E11.9). 02/08/22   [name], MD  CALPHRON 667 MG tablet Take 1,334 mg by mouth 3 (three) times daily with meals. 11/09/21   [provider]  Continuous Blood Gluc Receiver (FREESTYLE LIBRE 2 READER) DEVI 1 Application by Does not apply route every 14 (fourteen) days. 02/08/22   [name], MD  diltiazem (CARDIZEM CD) 180 MG 24 hr capsule TAKE 1 CAPSULE(180 MG) BY MOUTH DAILY Patient taking differently: Take 180 mg by mouth daily. 08/24/21   Bensimhon, Shaune Pascal, MD  doxazosin (CARDURA) 4 MG tablet Take 1 tablet (4 mg total) by mouth daily. 12/24/21 01/23/22  Shahmehdi, Valeria Batman, MD  ELIQUIS 2.5 MG TABS tablet TAKE 1 TABLET(2.5 MG) BY MOUTH TWICE DAILY Patient taking differently: Take 2.5 mg by mouth 2 (two) times daily. 10/30/21   Bensimhon, Shaune Pascal, MD  GlucoCom Lancets MISC Use for home glucose monitoring 09/02/15   Harrison Mons, PA  glucose blood (ONETOUCH VERIO) test strip Test up to 2 times per day.  Uncontrolled diabetes with hyperglycemia and stage 4 CKD. 04/13/19   [name], MD  HYDROmorphone (DILAUDID) 2 MG tablet Take 0.5 tablets (1 mg total) by mouth every 12 (twelve) hours as needed for moderate pain. 12/31/21   [name], MD  insulin glargine (LANTUS) 100 UNIT/ML Solostar Pen Inject 5 Units into the skin daily. Patient taking differently: Inject 10 Units into the skin daily. 01/26/22   [name], MD  Insulin Pen Needle 31G X 5 MM MISC Use as directed 12/31/21   [name], MD  lactulose (CHRONULAC) 10 GM/15ML solution Take 15 mLs (10 g total) by mouth 2 (two) times  daily. Patient taking differently: Take 10 g by mouth 2 (two) times daily as needed for moderate constipation. 12/31/21   [name], MD  oxyCODONE-acetaminophen (PERCOCET) 5-325 MG tablet Take 1 tablet by mouth every 4 (four) hours as needed for severe pain. 02/08/22 02/08/23  [name], MD  OXYGEN Inhale 2.5 L into the lungs continuous.    [provider]  sitaGLIPtin (JANUVIA) 25 MG tablet TAKE 1 TABLET(25 MG) BY MOUTH DAILY Patient taking differently: Take 25 mg by mouth daily. 08/31/21   [name], MD  torsemide (DEMADEX) 20 MG tablet Take 2 tablets (40 mg total) by mouth 2 (two) times daily. Patient taking differently: Take 60 mg by mouth 2 (two) times daily. 10/05/21   Milford, Maricela Bo, FNP  vitamin B-12 (CYANOCOBALAMIN) 500 MCG tablet Take 500 mcg by mouth daily.    [provider]     Family History  Problem Relation Age of Onset   Hypertension Mother    Hyperlipidemia Mother  Hypertension Sister    Kidney disease Father    Heart disease Brother 41       open heart surgery   Colon cancer Neg Hx     Social History   Socioeconomic History   Marital status: Married    Spouse name: [name]   Number of children: 1   Years of education: 12th grade   Highest education level: Not on file  Occupational History   Occupation: Retired-accountant  Tobacco Use   Smoking status: Some Days    Packs/day: 0.10    Years: 48.00    Total pack years: 4.80    Types: Cigarettes    Last attempt to quit: 07/16/2019    Years since quitting: 2.6   Smokeless tobacco: Never   Tobacco comments:    09/03/21 Wife states, "he hides from me, so I am not sure how much he smokes. " Wife says he smokes occassionally 4 cigs a day."  Vaping Use   Vaping Use: Never used  Substance and Sexual Activity   Alcohol use: No    Alcohol/week: 0.0 standard drinks of alcohol   Drug use: No   Sexual activity: Never    Partners: Female  Other Topics Concern   Not on  file  Social History Narrative   Originally from [place]. Came to the [place] in 2009, following their son who came here for school.   Married.   Lives with his wife.   Their adult son lives in [place], [place], where he is in optometry school.   Education: [employer]   Exercise: No   Social Determinants of Radio broadcast assistant Strain: Not on file  Food Insecurity: Not on file  Transportation Needs: Not on file  Physical Activity: Not on file  Stress: Not on file  Social Connections: Not on file    Review of Systems: A 12 point ROS discussed and pertinent positives are indicated in the HPI above.  All other systems are negative.  Review of Systems  All other systems reviewed and are negative.   Vital Signs: BP 107/60 (BP Location: Left Arm)   Pulse 74   Temp 97.8 F (36.6 C) (Oral)   Resp 19   Ht $R'5\' 6"'nZ$  (1.676 m)   Wt 118 lb 6.2 oz (53.7 kg)   SpO2 92%   BMI 19.11 kg/m    Physical Exam Constitutional:      General: He is not in acute distress.    Appearance: He is ill-appearing.  Eyes:     General: Scleral icterus present.  Cardiovascular:     Rate and Rhythm: Normal rate and regular rhythm.  Pulmonary:     Effort: Pulmonary effort is normal. No respiratory distress.     Breath sounds: Normal breath sounds.  Abdominal:     General: Bowel sounds are normal.     Palpations: Abdomen is soft.  Musculoskeletal:     Right lower leg: No edema.     Left lower leg: No edema.  Skin:    General: Skin is warm and dry.  Neurological:     Mental Status: He is alert and oriented to person, place, and time.  Psychiatric:        Mood and Affect: Mood normal.        Behavior: Behavior normal.        Thought Content: Thought content normal.        Judgment: Judgment normal.     Imaging: MR ABDOMEN MRCP WO CONTRAST  Result Date: 02/21/2022 CLINICAL DATA:  76 year old male with history of jaundice. History of retroperitoneal paraganglioma EXAM: MRI ABDOMEN WITHOUT CONTRAST   (INCLUDING MRCP) TECHNIQUE: Multiplanar multisequence MR imaging of the abdomen was performed. Heavily T2-weighted images of the biliary and pancreatic ducts were obtained, and three-dimensional MRCP images were rendered by post processing. COMPARISON:  No prior abdominal MRI. CT of the abdomen and pelvis 02/11/2022. FINDINGS: Comment: Today's study is limited for detection and characterization of visceral and/or vascular lesions by lack of IV gadolinium. Considerable patient motion also limits portions of today's examination. Lower chest: Large right and small left pleural effusions. Near complete atelectasis of the visualized portions of the right lung. Dependent signal intensity in the left lower lobe is also likely to reflect subsegmental atelectasis, but poorly evaluated on today's magnetic resonance examination. Cardiomegaly. Hepatobiliary: There is diffuse loss of signal intensity throughout the hepatic parenchyma on in phase dual echo images, as well as diffuse low signal intensity throughout the hepatic parenchyma on T2 weighted images, indicative of severe hepatic iron deposition. No discrete cystic or solid hepatic lesions are confidently identified on today's noncontrast examination. No intra or extrahepatic biliary ductal dilatation noted. MRCP images are essentially nondiagnostic, however, T2 weighted images demonstrate the common bile duct to be 3 mm in the porta hepatis. Gallbladder is unremarkable in appearance. Pancreas: No pancreatic mass. No pancreatic ductal dilatation. No pancreatic or peripancreatic fluid collections or inflammatory changes. Spleen: Diffuse loss of signal intensity throughout the spleen during in phase dual echo images, as well as diffuse low T2 signal intensity in the spleen, indicative of severe splenic iron deposition. Adrenals/Urinary Tract: Multiple renal lesions bilaterally varying in degree of signal intensity and complexity, ranging from T1 hypointense and T2  hyperintense to T1 hyperintense and T2 hypointense, incompletely characterized on today's noncontrast CT examination, but likely a combination of simple and proteinaceous/hemorrhagic cysts. The exception to this is a complex lesion in the upper pole of the right kidney (axial image 19 of series 4) which measures 2.7 cm in diameter which is T1 hyperintense and has T2 hyperintensity non dependently with a dependent nodular appearing area of T2 hypointensity measuring 2.6 x 1.3 cm (axial image 19 of series 4). No hydroureteronephrosis in the visualized portions of the abdomen. Bilateral adrenal glands are unremarkable in appearance. Stomach/Bowel: Visualized portions are unremarkable. Vascular/Lymphatic: No aneurysm identified in the visualized abdominal vasculature. No definite lymphadenopathy confidently identified in the abdomen on today's noncontrast motion limited examination. Other: Large left-sided retroperitoneal mass again noted measuring approximately 6.6 x 5.0 cm on today's study (axial image 25 of series 4) where there is a central area of low T1 and high T2 signal intensity, which may represent some internal necrosis. Trace volume of ascites. Musculoskeletal: No aggressive appearing osseous lesions are noted in the visualized portions of the skeleton. IMPRESSION: 1. Study is exceedingly limited by lack of IV gadolinium and extensive patient motion. 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 Specifically, no imaging findings to suggest biliary tract obstruction at this time. 2. Large left-sided retroperitoneal mass, similar to prior CT examinations, poorly demonstrated on today's noncontrast study, but compatible with reported paraganglioma. 3. Large right and small left pleural effusions with areas of presumed atelectasis in the lung bases bilaterally, as above. 4.  Multiple renal lesions of varying degrees of complexity bilaterally, including a truly indeterminate lesion in the  upper pole of the right kidney which measures 2.7 cm with a dependent nodular appearing solid component. Repeat abdominal MRI with and without IV gadolinium should be considered to provide definitive characterization and exclude neoplasm. Electronically Signed   By: Vinnie Langton M.D.   On: 02/21/2022 05:30   IR Fluoro Guide CV Line Right  Result Date: 02/18/2022 INDICATION: 76 year old male with recent hemodialysis catheter placement, with report of nonfunctional catheter last night. He presents for exchange/troubleshooting EXAM: IMAGE GUIDED EXCHANGE OF TUNNELED HEMODIALYSIS CATHETER MEDICATIONS: None ANESTHESIA/SEDATION: None FLUOROSCOPY: Radiation Exposure Index (as provided by the fluoroscopic device): 1 mGy Kerma COMPLICATIONS: None PROCEDURE: Informed written consent was obtained from the patient with Farsi interpretation after a discussion of the risks, benefits, and alternatives to treatment. Questions regarding the procedure were encouraged and answered. The right neck and chest, including the endo Ling catheter, were prepped with chlorhexidine in a sterile fashion, and a sterile drape was applied covering the operative field. Maximum barrier sterile technique with sterile gowns and gloves were used for the procedure. A timeout was performed prior to the initiation of the procedure. 1% lidocaine was used for local anesthesia. The red and blue port were both aspirated to clear the heparin. A stiff Glidewire was advanced to the IVC. Catheter was removed from the tract. A new 19 cm tip to cuff catheter was placed on the wire, with the tip at the superior cavoatrial junction, slightly more centrally than the prior. Vigorous aspiration of the catheter ports confirmed adequate function. Final catheter positioning was confirmed and documented with a spot radiographic image. The catheter  aspirates and flushes normally. The catheter was flushed with appropriate sodium citrate dwell. The catheter exit site was secured with a 0-Prolene retention suture. Dressings were applied. The patient tolerated the procedure well without immediate post procedural complication. IMPRESSION: Status post routine exchange of right IJ tunneled hemodialysis catheter. Signed, Dulcy Fanny. Nadene Rubins, RPVI [facility] Electronically Signed   By: Corrie Mckusick D.O.   On: 02/18/2022 10:28   VAS [place] LOWER EXTREMITY VENOUS (DVT)  Result Date: 02/16/2022  Lower Venous DVT Study Patient Name:  MACEO HERNAN  Date of Exam:   02/16/2022 Medical Rec #: [id number]         Accession #:    [id number] Date of Birth: [birth year]         Patient Gender: M Patient Age:   2 years Exam Location:  [facility] Procedure:      VAS [place] LOWER EXTREMITY VENOUS (DVT) Referring Phys: Vance Gather --------------------------------------------------------------------------------  Indications: Edema.  Comparison Study: no prior Performing Technologist: Archie Patten RVS  Examination Guidelines: A complete evaluation includes B-mode imaging, spectral Doppler, color Doppler, and power Doppler as needed of all accessible portions of each vessel. Bilateral testing is considered an integral part of a complete examination. Limited examinations for reoccurring indications may be performed as noted. The reflux portion of the exam is performed with the patient in reverse Trendelenburg.  +---------+---------------+---------+-----------+----------+--------------+ RIGHT    CompressibilityPhasicitySpontaneityPropertiesThrombus Aging +---------+---------------+---------+-----------+----------+--------------+ CFV      Full           Yes      Yes                                 +---------+---------------+---------+-----------+----------+--------------+ SFJ      Full                                                         +---------+---------------+---------+-----------+----------+--------------+  FV Prox  Full                                                        +---------+---------------+---------+-----------+----------+--------------+ FV Mid   Full                                                        +---------+---------------+---------+-----------+----------+--------------+ FV DistalFull                                                        +---------+---------------+---------+-----------+----------+--------------+ PFV      Full                                                        +---------+---------------+---------+-----------+----------+--------------+ POP      Full           Yes      Yes                                 +---------+---------------+---------+-----------+----------+--------------+ PTV      Full                                                        +---------+---------------+---------+-----------+----------+--------------+ PERO     Full                                                        +---------+---------------+---------+-----------+----------+--------------+   +---------+---------------+---------+-----------+----------+--------------+ LEFT     CompressibilityPhasicitySpontaneityPropertiesThrombus Aging +---------+---------------+---------+-----------+----------+--------------+ CFV      Full           Yes      Yes                                 +---------+---------------+---------+-----------+----------+--------------+ SFJ      Full                                                        +---------+---------------+---------+-----------+----------+--------------+ FV Prox  Full                                                        +---------+---------------+---------+-----------+----------+--------------+  FV Mid   Full                                                         +---------+---------------+---------+-----------+----------+--------------+ FV DistalFull                                                        +---------+---------------+---------+-----------+----------+--------------+ PFV      Full                                                        +---------+---------------+---------+-----------+----------+--------------+ POP      Full           Yes      Yes                                 +---------+---------------+---------+-----------+----------+--------------+ PTV      Full                                                        +---------+---------------+---------+-----------+----------+--------------+ PERO     Full                                                        +---------+---------------+---------+-----------+----------+--------------+     Summary: BILATERAL: - No evidence of deep vein thrombosis seen in the lower extremities, bilaterally. -No evidence of popliteal cyst, bilaterally.   *See table(s) above for measurements and observations. Electronically signed by [name] MD on 02/16/2022 at 7:18:05 PM.    Final    DG CHEST PORT 1 VIEW  Result Date: 02/16/2022 CLINICAL DATA:  Weakness.  Acute respiratory failure with hypoxia. EXAM: PORTABLE CHEST 1 VIEW COMPARISON:  Chest radiograph 02/11/2022. FINDINGS: Enlarged cardiac silhouette, similar. Similar bibasilar opacities. Similar moderate bilateral pleural effusions. No visible pneumothorax. Similar position of a right IJ approach central venous catheter with the tip projecting at the superior cavoatrial junction. IMPRESSION: Similar cardiomegaly, moderate bilateral pleural effusions, and overlying bibasilar opacities. Findings better characterized on recent CT chest. Electronically Signed   By: Margaretha Sheffield M.D.   On: 02/16/2022 08:15   IR Fluoro Guide CV Line Right  Result Date: 02/15/2022 INDICATION: 76 year old male presents for tunneled hemodialysis  catheter placement EXAM: IMAGE GUIDED TUNNELED HEMODIALYSIS CATHETER. MEDICATIONS: 2 G ANCEF. The antibiotic was given in an appropriate time interval prior to skin puncture. ANESTHESIA/SEDATION: Moderate (conscious) sedation was employed during this procedure. A total of Versed 0.5 mg and Fentanyl 50 mcg was administered intravenously by the radiology nurse. Total intra-service moderate Sedation Time: 25 minutes. The patient's level of consciousness and vital signs were  monitored continuously by radiology nursing throughout the procedure under my direct supervision. FLUOROSCOPY: Radiation Exposure Index (as provided by the fluoroscopic device): 1 mGy Kerma COMPLICATIONS: None PROCEDURE: Informed written consent was obtained from the patient after a discussion of the risks, benefits, and alternatives to treatment. Questions regarding the procedure were encouraged and answered. The right neck and chest were prepped with chlorhexidine in a sterile fashion, and a sterile drape was applied covering the operative field. Maximum barrier sterile technique with sterile gowns and gloves were used for the procedure. A timeout was performed prior to the initiation of the procedure. Ultrasound survey was performed. The right internal jugular vein was confirmed to be patent, with images stored and sent to PACS. Micropuncture kit was utilized to access the right internal jugular vein under direct, real-time ultrasound guidance after the overlying soft tissues were anesthetized with 1% lidocaine with epinephrine. Stab incision was made with 11 blade scalpel. Microwire was passed centrally. The microwire was then marked to measure appropriate internal catheter length. External tunneled length was estimated. A total tip to cuff length of 19 cm was selected. 035 guidewire was advanced to the level of the IVC. Skin and subcutaneous tissues of chest wall below the clavicle were generously infiltrated with 1% lidocaine for local  anesthesia. A small stab incision was made with 11 blade scalpel. The selected hemodialysis catheter was tunneled in a retrograde fashion from the anterior chest wall to the venotomy incision. Serial dilation was performed and then a peel-away sheath was placed. The catheter was then placed through the peel-away sheath with tips ultimately positioned within the superior aspect of the right atrium. Final catheter positioning was confirmed and documented with a spot radiographic image. The catheter aspirates and flushes normally. The catheter was flushed with appropriate volume heparin dwells. The catheter exit site was secured with a 0-Prolene retention suture. The venotomy incision was closed Derma bond and sterile dressing. Dressings were applied at the chest wall. Patient tolerated the procedure well and remained hemodynamically stable throughout. No complications were encountered and no significant blood loss encountered. IMPRESSION: Status post image guided right IJ tunneled hemodialysis catheter. Signed, Dulcy Fanny. Nadene Rubins, RPVI [facility] Radiology Electronically Signed   By: Corrie Mckusick D.O.   On: 02/15/2022 13:06   IR US Guide Vasc Access Right  Result Date: 02/15/2022 INDICATION: 77 year old male presents for tunneled hemodialysis catheter placement EXAM: IMAGE GUIDED TUNNELED HEMODIALYSIS CATHETER. MEDICATIONS: 2 G ANCEF. The antibiotic was given in an appropriate time interval prior to skin puncture. ANESTHESIA/SEDATION: Moderate (conscious) sedation was employed during this procedure. A total of Versed 0.5 mg and Fentanyl 50 mcg was administered intravenously by the radiology nurse. Total intra-service moderate Sedation Time: 25 minutes. The patient's level of consciousness and vital signs were monitored continuously by radiology nursing throughout the procedure under my direct supervision. FLUOROSCOPY: Radiation Exposure Index (as provided by  the fluoroscopic device): 1 mGy Kerma COMPLICATIONS: None PROCEDURE: Informed written consent was obtained from the patient after a discussion of the risks, benefits, and alternatives to treatment. Questions regarding the procedure were encouraged and answered. The right neck and chest were prepped with chlorhexidine in a sterile fashion, and a sterile drape was applied covering the operative field. Maximum barrier sterile technique with sterile gowns and gloves were used for the procedure. A timeout was performed prior to the initiation of the procedure. Ultrasound survey was performed. The right internal jugular vein was confirmed to be patent, with  images stored and sent to PACS. Micropuncture kit was utilized to access the right internal jugular vein under direct, real-time ultrasound guidance after the overlying soft tissues were anesthetized with 1% lidocaine with epinephrine. Stab incision was made with 11 blade scalpel. Microwire was passed centrally. The microwire was then marked to measure appropriate internal catheter length. External tunneled length was estimated. A total tip to cuff length of 19 cm was selected. 035 guidewire was advanced to the level of the IVC. Skin and subcutaneous tissues of chest wall below the clavicle were generously infiltrated with 1% lidocaine for local anesthesia. A small stab incision was made with 11 blade scalpel. The selected hemodialysis catheter was tunneled in a retrograde fashion from the anterior chest wall to the venotomy incision. Serial dilation was performed and then a peel-away sheath was placed. The catheter was then placed through the peel-away sheath with tips ultimately positioned within the superior aspect of the right atrium. Final catheter positioning was confirmed and documented with a spot radiographic image. The catheter aspirates and flushes normally. The catheter was flushed with appropriate volume heparin dwells. The catheter exit site was secured  with a 0-Prolene retention suture. The venotomy incision was closed Derma bond and sterile dressing. Dressings were applied at the chest wall. Patient tolerated the procedure well and remained hemodynamically stable throughout. No complications were encountered and no significant blood loss encountered. IMPRESSION: Status post image guided right IJ tunneled hemodialysis catheter. Signed, Dulcy Fanny. Nadene Rubins, RPVI [facility] Special[facility] Radiology Electronically Signed   By: Corrie Mckusick D.O.   On: 02/15/2022 13:06   US LIVER DOPPLER  Result Date: 02/18/2022 CLINICAL DATA:  76 year old male with history of liver failure. EXAM: DUPLEX ULTRASOUND OF LIVER TECHNIQUE: Color and duplex Doppler ultrasound was performed to evaluate the hepatic in-flow and out-flow vessels. COMPARISON:  CT abdomen pelvis from 02/11/2022 FINDINGS: Liver: Normal parenchymal echogenicity. Normal hepatic contour without nodularity. No focal lesion, mass or intrahepatic biliary ductal dilatation. Main Portal Vein size: 1.5 cm Portal Vein Velocities Main Prox:  41 cm/sec Main Mid: 27 cm/sec Main Dist:  38 cm/sec Right: 63 cm/sec Left: 21 cm/sec All visualized portal venous flow is antegrade/hepatopetal. Hepatic Vein Velocities Right:  25 cm/sec Middle:  41 cm/sec Left:  38 cm/sec IVC: Present and patent with normal respiratory phasicity. Hepatic Artery Velocity:  241 cm/sec Splenic Vein Velocity:  43 cm/sec Spleen: 3.8 cm x 7.6 cm x 2.8 cm with a total volume of 42 cm^3 (411 cm^3 is upper limit normal) Portal Vein Occlusion/Thrombus: No Splenic Vein Occlusion/Thrombus: No Ascites: Trace Varices: None Right greater than left bilateral pleural effusions are noted. IMPRESSION: 1. Patent hepatic vasculature with normal direction of flow. 2. Normal sonographic appearance of the liver. 3. Trace ascites. 4. Right greater than left bilateral pleural effusions. Ruthann Cancer, MD Vascular and Interventional  Radiology Specialists [facility] Radiology Electronically Signed   By: Ruthann Cancer M.D.   On: 02/16/2022 11:01   ECHOCARDIOGRAM LIMITED  Result Date: 02/11/2022    ECHOCARDIOGRAM LIMITED REPORT   Patient Name:   DAUNDRE BIEL Date of Exam: 02/11/2022 Medical Rec #:  [id number]        Height:       66.0 in Accession #:    [id number]       Weight:       120.4 lb Date of Birth:  21-Apr-1946        BSA:  1.612 m Patient Age:    58 years         BP:           136/56 mmHg Patient Gender: M                HR:           92 bpm. Exam Location:  Inpatient Procedure: Limited Echo, Color Doppler and Cardiac Doppler Indications:    R94.31 Abnormal EKG  History:        Patient has prior history of Echocardiogram examinations, most                 recent 11/14/2021. CHF, Arrythmias:Atrial Fibrillation; Risk                 Factors:Hypertension, Diabetes, Dyslipidemia and CKD.  Sonographer:    Raquel Sarna Senior RDCS Referring Phys: [id number] Francesca Jewett  Sonographer Comments: Exam performed sitting mostly upright due to dypsnea. IMPRESSIONS  1. Left ventricular ejection fraction, by estimation, is 55 to 60%. The left ventricle has normal function.  2. Right ventricular systolic function is normal. The right ventricular size is moderately enlarged. There is moderately elevated pulmonary artery systolic pressure.  3. Left atrial size was mildly dilated.  4. The mitral valve is normal in structure. Mild mitral valve regurgitation. No evidence of mitral stenosis.  5. Tricuspid valve regurgitation is moderate.  6. The aortic valve is tricuspid.  7. The inferior vena cava is dilated in size with <50% respiratory variability, suggesting right atrial pressure of 15 mmHg. FINDINGS  Left Ventricle: Left ventricular ejection fraction, by estimation, is 55 to 60%. The left ventricle has normal function. Right Ventricle: The right ventricular size is moderately enlarged. Right ventricular systolic function is normal. There is moderately  elevated pulmonary artery systolic pressure. The tricuspid regurgitant velocity is 3.18 m/s, and with an assumed right atrial pressure of 15 mmHg, the estimated right ventricular systolic pressure is 75.6 mmHg. Left Atrium: Left atrial size was mildly dilated. Mitral Valve: The mitral valve is normal in structure. Mild mitral valve regurgitation. No evidence of mitral valve stenosis. Tricuspid Valve: The tricuspid valve is normal in structure. Tricuspid valve regurgitation is moderate . No evidence of tricuspid stenosis. Aortic Valve: The aortic valve is tricuspid. Pulmonic Valve: The pulmonic valve was normal in structure. Pulmonic valve regurgitation is not visualized. No evidence of pulmonic stenosis. Aorta: Aortic root could not be assessed. Venous: The inferior vena cava is dilated in size with less than 50% respiratory variability, suggesting right atrial pressure of 15 mmHg. LEFT VENTRICLE PLAX 2D LVOT diam:     2.00 cm LV SV:         61 LV SV Index:   38 LVOT Area:     3.14 cm  RIGHT VENTRICLE RV S prime:     17.40 cm/s TAPSE (M-mode): 2.2 cm AORTIC VALVE LVOT Vmax:   119.00 cm/s LVOT Vmean:  71.200 cm/s LVOT VTI:    0.195 m TRICUSPID VALVE TR Peak grad:   40.4 mmHg TR Vmax:        318.00 cm/s  SHUNTS Systemic VTI:  0.20 m Systemic Diam: 2.00 cm Kardie Tobb DO Electronically signed by Berniece Salines DO Signature Date/Time: 02/11/2022/12:34:17 PM    Final    CT ABDOMEN PELVIS WO CONTRAST  Result Date: 02/11/2022 CLINICAL DATA:  Abdominal pain, acute, nonlocalized. Left retroperitoneal paraganglioma, chronic kidney disease. EXAM: CT ABDOMEN AND PELVIS WITHOUT CONTRAST TECHNIQUE: Multidetector CT imaging of the  abdomen and pelvis was performed following the standard protocol without IV contrast. RADIATION DOSE REDUCTION: This exam was performed according to the departmental dose-optimization program which includes automated exposure control, adjustment of the mA and/or kV according to patient size and/or use of  iterative reconstruction technique. COMPARISON:  01/28/2022. FINDINGS: Lower chest: The heart is enlarged and there is no pericardial effusion. Scattered coronary artery calcifications are noted. There are loculated pleural effusions bilaterally with patchy infiltrates. Hepatobiliary: No focal liver abnormality. No biliary ductal dilatation. The gallbladder is not visualized on exam. Pancreas: Unremarkable. No pancreatic ductal dilatation or surrounding inflammatory changes. Spleen: Normal in size without focal abnormality. Adrenals/Urinary Tract: Mild thickening of the adrenal glands with no evidence of discrete nodule. Cysts are noted in the kidneys bilaterally. There are tiny subcentimeter hyperdense structures in the kidneys bilaterally, possible hemorrhagic or proteinaceous cysts. No renal calculus or hydronephrosis. There is a slightly hyperdense lesion in the upper pole the right kidney measuring 2.4 cm. A Foley catheter is noted in the urinary bladder and the bladder is nondistended. Stomach/Bowel: Stomach is within normal limits. Appendix is not seen. No evidence of bowel wall thickening, distention, or inflammatory changes. No free air or pneumatosis. A few scattered diverticular present along the colon without evidence of diverticulitis. A moderate amount of retained stool is present in the colon. Vascular/Lymphatic: Aortic atherosclerosis. A stable mass is noted in the retroperitoneum on the left measuring 7.1 x 5.5 cm, compatible with known paraganglioma. No abdominal or pelvic lymphadenopathy. Reproductive: The prostate gland is enlarged. Other: There is a small left inguinal hernia containing nonobstructed bowel. Mesenteric edema is noted. No abdominopelvic ascites. Musculoskeletal: Degenerative changes are present in the thoracolumbar spine. No acute osseous abnormality. IMPRESSION: 1. No acute intra-abdominal process. 2. Moderate amount of retained stool in the colon suggesting constipation. 3.  Loculated pleural effusions bilaterally with strandy atelectasis or infiltrate, not significantly changed from the prior exam. 4. Stable retroperitoneal mass on the left, previously characterized as paraganglioma by history. 5. Bilateral renal cysts. There is a complex lesion in the upper pole of the right kidney measuring 2.4 cm. Ultrasound is recommended for further characterization on follow-up. 6. Aortic atherosclerosis. 7. Remaining incidental findings as described above. Electronically Signed   By: Brett Fairy M.D.   On: 02/11/2022 02:00   DG Chest Portable 1 View  Result Date: 02/11/2022 CLINICAL DATA:  Status post central line placement EXAM: PORTABLE CHEST 1 VIEW COMPARISON:  Chest x-ray from the previous day. FINDINGS: Cardiac shadow is enlarged but stable. New right jugular central line is noted at the cavoatrial junction. Stable effusions are seen right greater than left as well as basilar airspace disease right greater than left. No pneumothorax is seen. IMPRESSION: No pneumothorax following right jugular central line placement Remainder of the exam is stable. Electronically Signed   By: Inez Catalina M.D.   On: 02/11/2022 00:28   DG Chest Port 1 View  Result Date: 02/23/2022 CLINICAL DATA:  Weakness. EXAM: PORTABLE CHEST 1 VIEW COMPARISON:  December 13, 2021 FINDINGS: The heart size and mediastinal contours are within normal limits. There is calcification of the aortic arch. Persistent marked severity multifocal airspace disease is seen within the mid and lower lungs. Stable bilateral pleural effusions are also noted, right greater than left. No pneumothorax is identified. The visualized skeletal structures are unremarkable. IMPRESSION: 1. Persistent marked severity multifocal airspace disease with stable bilateral pleural effusions, right greater than left. Electronically Signed   By: Joyce Gross.D.  On: 03/01/2022 21:45   PERIPHERAL VASCULAR CATHETERIZATION  Result Date:  01/29/2022 Table formatting from the original result was not included. INDICATIONS:  Catcher Dehoyos is a 76 y.o. male who was set up for a fistulogram as his fistula was nonfunctioning.  The history is obtained through the translator and as I understand that he is not yet on dialysis.  His fistula was placed on 09/04/2021.  It was a right brachiocephalic fistula. PROCEDURE:  Fistulogram right brachiocephalic AV fistula SURGEON: Judeth Cornfield. Scot Dock, MD, FACS ANESTHESIA: Local EBL: Minimal TECHNIQUE: The patient was brought to the peripheral vascular lab and the right arm was prepped and draped in usual sterile fashion.  After the skin was anesthetized with 1% lidocaine I cannulated the vein with a micropuncture needle and a micropuncture sheath was introduced over a wire.  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  There was reflux into the basilic vein which did appear to be patent.  There was no evidence of central venous obstruction.  I did attempt to pass a wire through the fistula but this was chronically occluded and this was unsuccessful.  A 4-0 Monocryl was used to close the cannulation site and there was good hemostasis. FINDINGS: No central venous occlusion 2.  Patent basilic vein right arm 3.  Occluded right brachiocephalic fistula CLINICAL NOTE: The patient will be scheduled for either a basilic vein transposition on the right or an AV graft.  We will discuss with nephrology if he needs a catheter.   CT Abdomen Pelvis Wo Contrast  Result Date: 01/28/2022 CLINICAL DATA:  Bowel obstruction suspected EXAM: CT ABDOMEN AND PELVIS WITHOUT CONTRAST TECHNIQUE: Multidetector CT imaging of the abdomen and pelvis was performed following the standard protocol without IV contrast. RADIATION DOSE REDUCTION: This exam was performed according to the departmental dose-optimization program which includes automated exposure control, adjustment of the mA and/or kV according to  patient size and/or use of iterative reconstruction technique. COMPARISON:  CT chest, abdomen and pelvis dated December 13, 2021 FINDINGS: Lower chest: Cardiomegaly. Small loculated right-greater-than-left pleural effusions with pleural thickening. Bilateral rounded airspace opacities which are likely due to round atelectasis. Ground-glass opacities of the lung bases with associated septal thickening. Hepatobiliary: No suspicious liver lesions. No biliary ductal dilation. Pancreas: Unremarkable. No pancreatic ductal dilatation or surrounding inflammatory changes. Spleen: Normal in size without focal abnormality. Adrenals/Urinary Tract: Thickening of the left-greater-than-right adrenal glands, similar to prior exam. No hydronephrosis or nephrolithiasis. Unchanged bilateral simple appearing and minimally complex renal cysts. Bladder is decompressed and contains a Foley catheter. Stomach/Bowel: Stomach is unremarkable. No bowel wall thickening or inflammatory change. Large stool burden seen throughout the colon. No evidence of obstruction. Vascular/Lymphatic: Aortic atherosclerosis. No definite enlarged lymph nodes seen in the abdomen or pelvis. Reproductive: Prostatomegaly. Other: Large left periaortic lesion which appears to be centrally necrotic measuring 7.0 x 5.6 cm, unchanged in size when compared with prior exam and remeasured in similar plane. Small right inguinal hernia containing nondilated loops of small bowel. Musculoskeletal: No acute or significant osseous findings. IMPRESSION: 1. Large stool burden seen throughout the colon, compatible with constipation. No evidence of obstruction. 2. Stable size of large left retroperitoneal lesion. 3. Small right-greater-than-left chronic appearing pleural effusions. 4. Cardiomegaly and pulmonary edema. 5.  Aortic Atherosclerosis (ICD10-I70.0). Electronically Signed   By: Yetta Glassman M.D.   On: 01/28/2022 13:20    Labs:  CBC: Recent Labs    02/21/22 0012  02/22/22 0151 02/23/22 0622 02/24/22  0048  WBC 17.1* 17.7* 17.4* 15.6*  HGB 8.9* 9.2* 9.8* 10.4*  HCT 25.5* 27.1* 29.2* 30.3*  PLT 82* 120* 117* 138*    COAGS: Recent Labs    02/16/22 0123 02/17/22 0049 02/18/22 0209 02/24/22 0048  INR 1.3* 1.4* 1.3* 1.2    BMP: Recent Labs    02/21/22 0012 02/22/22 0151 02/23/22 0622 02/24/22 0048  NA 135 131* 134* 131*  K 3.3* 4.5 3.8 4.5  CL 94* 92* 94* 92*  CO2 $Re'26 24 25 22  'IIQ$ GLUCOSE 142* 113* 99 131*  BUN 31* 52* 35* 52*  CALCIUM 7.5* 6.7* 7.0* 6.3*  CREATININE 2.59* 3.68* 3.34* 4.03*  GFRNONAA 25* 17* 19* 15*    LIVER FUNCTION TESTS: Recent Labs    02/21/22 0012 02/22/22 0151 02/23/22 0622 02/24/22 0048  BILITOT 13.9* 15.9* 16.8* 17.2*  AST 349* 360* 312* 315*  ALT 130* 134* 125* 125*  ALKPHOS 2,273* 2,274* 2,386* 2,297*  PROT 5.0* 4.9* 5.1* 5.1*  ALBUMIN 2.0* 2.0* 1.8* 1.9*    TUMOR MARKERS: No results for input(s): "AFPTM", "CEA", "CA199", "CHROMGRNA" in the last 8760 hours.  Assessment and Plan: History of retroperitoneal paraganglioma, CKD stage V, chronic HFpEF, and paroxysmal A-fib.  Patient presented to ED 03/10/2022 complaining of leg swelling and weakness.  Patient was found to have junctional bradycardia with hyperkalemia, acidosis, uremia, volume overload and elevated LFTs.  Patient was admitted with sepsis due to Pseudomonas UTI.  Since being admitted patient had temporary HD catheter for urgent hemodialysis 02/11/2022 with IR.  Patient had tunneled HD catheter placed 02/15/2022 and then exchanged 02/18/2022 due to clotting.  Patient has continued to have LFTs trend up down and then back up again.  Patient has been referred to IR by Thornton Park, MD, for random liver biopsy.  Pt resting in bed with wife at bedside.  Pt aware that he will be NPO at [facility] tonight.   Risks and benefits of random liver biopsy with moderate sedation was discussed with the patient and/or patient's family including, but not limited to  bleeding, infection, damage to adjacent structures or low yield requiring additional tests.  All of the questions were answered and there is agreement to proceed.  Consent signed and in chart.   Thank you for this interesting consult.  I greatly enjoyed meeting Adam Reid and look forward to participating in their care.  A copy of this report was sent to the requesting provider on this date.  Electronically Signed: Tyson Alias, NP 02/24/2022, 7:20 AM   I spent a total of 20 minutes in face to face in clinical consultation, greater than 50% of which was counseling/coordinating care for abnormal liver enzymes.

## 2022-02-24 NOTE — Progress Notes (Signed)
Inpatient Rehab Admissions Coordinator:    Continue to await insurance auth for CIR. I will continue to follow for potential admit pending auth.   Clemens Catholic, [place], [place] Admissions Coordinator  [phone] ([place]) [phone] (office)

## 2022-02-24 NOTE — Progress Notes (Signed)
Physical Therapy Treatment Patient Details Name: Adam Reid MRN: [id number] DOB: Jan 14, 1946 Today's Date: 02/24/2022   History of Present Illness Pt is a 76 y.o. male presenting 02/09/2022 with generalized weakness and bradycardia. 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 8/7 placement of a right IJ approach tunneled HD catheter. 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    PT Comments    Pt making gradual progress but does fatigue easily.  Ambulated 2' and performed multiple transfers but needs min guard-mod A and min cues for safety. Cont POC.  Recommendations for follow up therapy are one component of a multi-disciplinary discharge planning process, led by the attending physician.  Recommendations may be updated based on patient status, additional functional criteria and insurance authorization.  Follow Up Recommendations  Acute inpatient rehab (3hours/day)     Assistance Recommended at Discharge Frequent or constant Supervision/Assistance  Patient can return home with the following A little help with walking and/or transfers;A little help with bathing/dressing/bathroom;Assistance with cooking/housework;Direct supervision/assist for medications management;Assist for transportation;Direct supervision/assist for financial management;Help with stairs or ramp for entrance   Equipment Recommendations  None recommended by PT    Recommendations for Other Services       Precautions / Restrictions Precautions Precautions: Fall;Other (comment)     Mobility  Bed Mobility   Bed Mobility: Sit to Supine       Sit to supine: Min guard   General bed mobility comments: sitting at arrival    Transfers Overall transfer level: Needs assistance Equipment used: Rolling walker (2 wheels) Transfers: Sit to/from Stand Sit to Stand: Mod assist, From elevated surface           General transfer comment:  Performed x 3 during session with cues for hand placement    Ambulation/Gait Ambulation/Gait assistance: Min assist Gait Distance (Feet): 80 Feet Assistive device: Rolling walker (2 wheels) Gait Pattern/deviations: Step-to pattern, Decreased stride length, Trunk flexed       General Gait Details: Cues for posture and RW proximity   Stairs             Wheelchair Mobility    Modified Rankin (Stroke Patients Only)       Balance Overall balance assessment: Needs assistance Sitting-balance support: No upper extremity supported, Feet supported Sitting balance-Leahy Scale: Good     Standing balance support: Bilateral upper extremity supported, During functional activity, Reliant on assistive device for balance Standing balance-Leahy Scale: Poor                              Cognition Arousal/Alertness: Awake/alert Behavior During Therapy: Flat affect Overall Cognitive Status: Within Functional Limits for tasks assessed                                 General Comments: WFL for simple tasks        Exercises General Exercises - Lower Extremity Long Arc Quad: AROM, Both, 10 reps, Seated Hip Flexion/Marching: AROM, Both, 10 reps, Seated Mini-Sqauts: AROM, Both, 5 reps, Standing    General Comments General comments (skin integrity, edema, etc.): VSS on 2 L (poor pleth when walking but when hand relaxed 97%)      Pertinent Vitals/Pain Pain Assessment Pain Assessment: No/denies pain    Home Living  Prior Function            PT Goals (current goals can now be found in the care plan section) Progress towards PT goals: Progressing toward goals    Frequency    Min 3X/week      PT Plan Current plan remains appropriate    Co-evaluation              AM-PAC PT "6 Clicks" Mobility   Outcome Measure  Help needed turning from your back to your side while in a flat bed without using  bedrails?: A Little Help needed moving from lying on your back to sitting on the side of a flat bed without using bedrails?: A Little Help needed moving to and from a bed to a chair (including a wheelchair)?: A Little Help needed standing up from a chair using your arms (e.g., wheelchair or bedside chair)?: A Lot Help needed to walk in hospital room?: A Little Help needed climbing 3-5 steps with a railing? : A Lot 6 Click Score: 16    End of Session Equipment Utilized During Treatment: Oxygen;Gait belt Activity Tolerance: Patient tolerated treatment well Patient left: in bed;with call bell/phone within reach;with bed alarm set Nurse Communication: Mobility status PT Visit Diagnosis: Other abnormalities of gait and mobility (R26.89);Muscle weakness (generalized) (M62.81)     Time: 3754-3606 PT Time Calculation (min) (ACUTE ONLY): 26 min  Charges:  $Gait Training: 8-22 mins $Therapeutic Exercise: 8-22 mins                     Abran Richard, PT Acute Rehab [facility], [facility], [facility] [place] 02/24/2022, 12:09 PM

## 2022-02-24 NOTE — Progress Notes (Signed)
[facility] Progress Note  Subjective: patient seen and examined in room. No acute events, no complaints currently. Inquiring about liver biopsy today.  Vitals:   02/23/22 2319 02/24/22 0300 02/24/22 0405 02/24/22 0716  BP: (!) 114/54 (!) 109/51  107/60  Pulse: 69 69  74  Resp: '18 14  19  '$ Temp: 97.7 F (36.5 C) 97.7 F (36.5 C)  97.8 F (36.6 C)  TempSrc: Oral Axillary  Oral  SpO2: 100% 100%  92%  Weight:   53.7 kg   Height:        Exam: Gen: NAD, cachectic, laying flat in bed, jaundiced CV: RRR Resp: CTA BL Abd: soft, nt/nd Ext: trace edema b/l Les Neuro: awake, alert Dialysis access: RIJ TDC, RUE AVG +b/t   Recent Labs  Lab 02/22/22 0151 02/23/22 0622 02/24/22 0048  HGB 9.2* 9.8* 10.4*  ALBUMIN 2.0* 1.8* 1.9*  CALCIUM 6.7* 7.0* 6.3*  PHOS 9.9*  --   --   CREATININE 3.68* 3.34* 4.03*  K 4.5 3.8 4.5   No results for input(s): "IRON", "TIBC", "FERRITIN" in the last 168 hours. Inpatient medications:  sodium chloride   Intravenous Once   calcitRIOL  0.25 mcg Oral Q M,W,F   calcium acetate  667 mg Oral TID WC   Chlorhexidine Gluconate Cloth  6 each Topical Q0600   darbepoetin (ARANESP) injection - DIALYSIS  200 mcg Intravenous Q Sat-HD   diltiazem  180 mg Oral Daily   feeding supplement (NEPRO CARB STEADY)  237 mL Oral BID   insulin aspart  0-6 Units Subcutaneous TID WC   lidocaine-EPINEPHrine  20 mL Intradermal Once   multivitamin  1 tablet Oral QHS   pantoprazole  40 mg Oral Q0600   sodium chloride flush  3 mL Intravenous Q12H   traZODone  50 mg Oral QHS    sodium chloride Stopped (02/12/22 1243)   anticoagulant sodium citrate     alteplase, anticoagulant sodium citrate, bisacodyl, docusate sodium, HYDROmorphone (DILAUDID) injection, lidocaine (PF), ondansetron (ZOFRAN) IV, mouth rinse, polyethylene glycol, prochlorperazine, senna, sodium chloride flush   NEW ESRD: set up for OP HD at [facility] on MWF 12:15 chair time - last hep B  labs: 8/2, non-immune  Summary: CKD V patient f/b Dr Graylon Gunning, recent AVF placement in R arm (the R AVF placed in Feb 2023 failed and on 02/08/22 had a new RUA AVG placed). Pt also recently dx'd w/ L retroperitoneal paraganglioma came to ED w/ progressive fatigue, weakness and SOB. K+ 6.6 and junctional bradycardia. Was treated for hyperkalemia and pt agreed to start dialysis due to life-threatening cardiac instability. CCM placed temp cath and pt was dialyzed. Amio/ diltiazem were held. IR saw him and placed TDC. Pt improved and was CLIP'd to [place] on MWF. Admit complicated by pseudomonas UTI from 8/5. FTT felt to be due to ganglioma.   Assessment/ Plan: New ESRD - pt presented w/ junctional bradycardia and progressive CKD V, started HD on 8/3. Accepted to [facility] on MWF schedule. Will c/w HD on MWF schedule A/C chronic hypoxic resp failure, acute on chronic HFpEF - wears home O2 2-3 L [place]. UF as tolerated RUE AVG - placed 7/31. Using [facility] now.  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 On calcitriol now. Switching renvela to cal acetate HTN - BP's are on the low, getting cardizem  180 daily for afib. Pt in NSR now.  PAF - on cardizem for afib. Liver failure with coagulopathy - workup negative.  Per primary and GI. +hemosiderosis. Liver biopsy planned for 8/16 Sepsis due to pseudomonas UTI - s/p cefepime Retroperitoneal paraganglioma - to be seen at [facility] as he was deemed too high risk for surgery here. FTT - due to paraganglioma and progressive CKD.  Continue with protein supplements

## 2022-02-24 NOTE — Progress Notes (Signed)
TR[facility] night cross cover note:   I was notified by RN of serum calcium level of 6.3 per this morning's labs.  Calcium corrects to 7.9 in the setting of concomitant hypoalbuminemia, with albumin noted to be 1.9 this morning.  Patient with end-stage renal disease on hemodialysis.  Nephrology has been consulted and is following.  Patient due for hemodialysis later this morning (8/16).  Will defer to nephrology for calcium supplementation at the time hemodialysis later this morning.  I have also added-on a serum magnesium level to this morning's labs.      Babs Bertin, DO Hospitalist

## 2022-02-24 NOTE — Telephone Encounter (Signed)
Chart reviewed. Pt still in hospital

## 2022-02-24 NOTE — Progress Notes (Signed)
PROGRESS NOTE    Adam Reid  LZJ:[id number] DOB: 08/26/1945 DOA: 02/11/2022 PCP: [name], MD    Brief Narrative:  76 y.o. male with a history of retroperitoneal paraganglioma, stage V CKD, chronic HFpEF, PAF who presented to the ED 8/2 with weakness, leg swelling. He was found to have junctional bradycardia with hyperkalemia, acidosis, uremia, volume overload, and elevated LFTs. Imaging revealed loculated right pleural effusion. Temporary HD catheter was inserted and HD initiated urgently on 8/3. The hospitalization has been complicated by respiratory failure, liver failure and thrombocytopenia, epistaxis, coffee-ground emesis, and anemia requiring transfusions.  -Now started on hemodialysis Monday Wednesday Friday, permacath in place.  Right lower extremity AV graft placed 7/31. -Persistently elevated LFTs, GI following, plan for liver biopsy.   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 Tunnel catheter 8/7 Right upper extremity AV graft 7/31. Outpatient dialysis Monday Wednesday Friday.  Followed by nephrology. Symptomatically improved after dialysis.  Acute on chronic hypoxemic respiratory failure: On 2 to 3 L of oxygen at home for several months.  Multiple causes.  Stabilizing.  On room air today.  Chronic liver failure with coagulopathy: CT scan without biliary obstruction, Dopplers negative, serological work-up including ANA, smooth muscle IgG, CMV, EBV, hepatitis panel is negative. Initially suspected of congestive hepatopathy, however with persistent elevated LFTs suspecting intrinsic liver disease. Scheduled for liver biopsy tomorrow by IR.  Gastroenterology following. No evidence of metastatic disease, biopsy will be helpful.  Acute blood loss anemia on chronic anemia of iron deficiency and CKD: Received 3 units of PRBC, ESA with hemodialysis.  Hemoglobin is stable now.  Coffee-ground emesis, history of GERD and Barrett's esophagus: On  PPI.  Conservative management.  Paroxysmal A-fib with junctional bradycardia on arrival: Resolved.  On diltiazem for rate control.  Sepsis due to Pseudomonas UTI: Treated with cefepime.  Clinically improved.  Type 2 diabetes, well controlled with hyperglycemia: On SSI.  Continue.  Nutrition Status: Nutrition Problem: Severe Malnutrition Etiology: chronic illness (CKD now ESRD) Signs/Symptoms: severe muscle depletion, severe fat depletion Interventions: Nepro shake, MVI, Education, Liberalize Diet     DVT prophylaxis: SCDs Start: 02/11/22 0027   Code Status: Full code Family Communication: None at the bedside Disposition Plan: Status is: Inpatient Remains inpatient appropriate because: Inpatient procedures planned     Consultants:  Nephrology Gastroenterology  Procedures:  Multiple procedures as above  Antimicrobials:  GI antibiotics   Subjective: Patient seen and examined.  Denies any complaints.  Denies any abdominal pain chest pain or shortness of breath.  On room air.  Objective: Vitals:   02/23/22 2319 02/24/22 0300 02/24/22 0405 02/24/22 0716  BP: (!) 114/54 (!) 109/51  107/60  Pulse: 69 69  74  Resp: '18 14  19  '$ Temp: 97.7 F (36.5 C) 97.7 F (36.5 C)  97.8 F (36.6 C)  TempSrc: Oral Axillary  Oral  SpO2: 100% 100%  92%  Weight:   53.7 kg   Height:        Intake/Output Summary (Last 24 hours) at 02/24/2022 1130 Last data filed at 02/23/2022 2300 Gross per 24 hour  Intake 125 ml  Output --  Net 125 ml   Filed Weights   02/22/22 1127 02/23/22 0615 02/24/22 0405  Weight: 53.5 kg 50.2 kg 53.7 kg    Examination:  General exam: Frail and debilitated.  Thin and cachectic.  Chronically sick looking. Respiratory system: Clear to auscultation. Respiratory effort normal.  On room air today. Cardiovascular system: S1 &  S2 heard, RRR. No pedal edema. Gastrointestinal system: Abdomen is nondistended, soft and nontender. No organomegaly or masses felt.  Normal bowel sounds heard. Foley catheter with clear urine. Central nervous system: Alert and oriented. No focal neurological deficits.  Generalized weakness. Permacath right IJ. AV fistula left arm.    Data Reviewed: I have personally reviewed following labs and imaging studies  CBC: Recent Labs  Lab 02/20/22 0018 02/21/22 0012 02/22/22 0151 02/23/22 0622 02/24/22 0048  WBC 14.4* 17.1* 17.7* 17.4* 15.6*  HGB 9.1* 8.9* 9.2* 9.8* 10.4*  HCT 27.3* 25.5* 27.1* 29.2* 30.3*  MCV 91.9 90.4 91.2 92.7 91.0  PLT 81* 82* 120* 117* 956*   Basic Metabolic Panel: Recent Labs  Lab 02/20/22 0018 02/21/22 0012 02/22/22 0151 02/23/22 0622 02/24/22 0048  NA 134* 135 131* 134* 131*  K 4.1 3.3* 4.5 3.8 4.5  CL 94* 94* 92* 94* 92*  CO2 '25 26 24 25 22  '$ GLUCOSE 141* 142* 113* 99 131*  BUN 49* 31* 52* 35* 52*  CREATININE 3.68* 2.59* 3.68* 3.34* 4.03*  CALCIUM 7.6* 7.5* 6.7* 7.0* 6.3*  MG  --   --   --   --  2.0  PHOS  --   --  9.9*  --   --    GFR: Estimated Creatinine Clearance: 12.2 mL/min (A) (by C-G formula based on SCr of 4.03 mg/dL (H)). Liver Function Tests: Recent Labs  Lab 02/20/22 0018 02/21/22 0012 02/22/22 0151 02/23/22 0622 02/24/22 0048  AST 405* 349* 360* 312* 315*  ALT 137* 130* 134* 125* 125*  ALKPHOS 2,353* 2,273* 2,274* 2,386* 2,297*  BILITOT 13.9* 13.9* 15.9* 16.8* 17.2*  PROT 5.5* 5.0* 4.9* 5.1* 5.1*  ALBUMIN 2.2* 2.0* 2.0* 1.8* 1.9*   Recent Labs  Lab 02/20/22 1620  LIPASE 49   No results for input(s): "AMMONIA" in the last 168 hours. Coagulation Profile: Recent Labs  Lab 02/18/22 0209 02/24/22 0048  INR 1.3* 1.2   Cardiac Enzymes: No results for input(s): "CKTOTAL", "CKMB", "CKMBINDEX", "TROPONINI" in the last 168 hours. BNP (last 3 results) Recent Labs    12/10/21 1339  PROBNP 12,645*   HbA1C: No results for input(s): "HGBA1C" in the last 72 hours. CBG: Recent Labs  Lab 02/23/22 0615 02/23/22 1118 02/23/22 1622 02/23/22 2121  02/24/22 0616  GLUCAP 95 124* 157* 137* 100*   Lipid Profile: No results for input(s): "CHOL", "HDL", "LDLCALC", "TRIG", "CHOLHDL", "LDLDIRECT" in the last 72 hours. Thyroid Function Tests: No results for input(s): "TSH", "T4TOTAL", "FREET4", "T3FREE", "THYROIDAB" in the last 72 hours. Anemia Panel: No results for input(s): "VITAMINB12", "FOLATE", "FERRITIN", "TIBC", "IRON", "RETICCTPCT" in the last 72 hours. Sepsis Labs: No results for input(s): "PROCALCITON", "LATICACIDVEN" in the last 168 hours.  No results found for this or any previous visit (from the past 240 hour(s)).       Radiology Studies: No results found.      Scheduled Meds:  sodium chloride   Intravenous Once   calcitRIOL  0.25 mcg Oral Q M,W,F   calcium acetate  667 mg Oral TID WC   Chlorhexidine Gluconate Cloth  6 each Topical Q0600   darbepoetin (ARANESP) injection - DIALYSIS  200 mcg Intravenous Q Sat-HD   diltiazem  180 mg Oral Daily   feeding supplement (NEPRO CARB STEADY)  237 mL Oral BID   insulin aspart  0-6 Units Subcutaneous TID WC   lidocaine-EPINEPHrine  20 mL Intradermal Once   multivitamin  1 tablet Oral QHS   pantoprazole  40  mg Oral Q0600   sodium chloride flush  3 mL Intravenous Q12H   traZODone  50 mg Oral QHS   Continuous Infusions:  sodium chloride Stopped (02/12/22 1243)   anticoagulant sodium citrate       LOS: 13 days    Time spent: 35 minutes    Barb Merino, MD [facility] Pager [phone]

## 2022-02-24 NOTE — Progress Notes (Signed)
Received patient in bed to unit.  Alert and oriented.  Informed consent signed and in  chart.   Treatment initiated: 1220 Treatment completed: 1647  Patient tolerated well.  Transported back to the room  alert, without acute distress.  Hand-off given to patient's nurse.   Access used: DC Access issues: none  Total UF removed: 3L Medication(s) given: calcitriol Post HD VS:  Post HD weight:    [facility]

## 2022-02-25 ENCOUNTER — Inpatient Hospital Stay (HOSPITAL_COMMUNITY): Payer: Medicare Other

## 2022-02-25 DIAGNOSIS — N186 End stage renal disease: Secondary | ICD-10-CM | POA: Diagnosis not present

## 2022-02-25 DIAGNOSIS — Z992 Dependence on renal dialysis: Secondary | ICD-10-CM | POA: Diagnosis not present

## 2022-02-25 LAB — GLUCOSE, CAPILLARY
Glucose-Capillary: 73 mg/dL (ref 70–99)
Glucose-Capillary: 98 mg/dL (ref 70–99)
Glucose-Capillary: 187 mg/dL — ABNORMAL HIGH (ref 70–99)
Glucose-Capillary: 125 mg/dL — ABNORMAL HIGH (ref 70–99)

## 2022-02-25 LAB — COMPREHENSIVE METABOLIC PANEL
ALT: 122 U/L — ABNORMAL HIGH (ref 0–44)
AST: 269 U/L — ABNORMAL HIGH (ref 15–41)
Albumin: 1.8 g/dL — ABNORMAL LOW (ref 3.5–5.0)
Alkaline Phosphatase: 2226 U/L — ABNORMAL HIGH (ref 38–126)
Anion gap: 13 (ref 5–15)
BUN: 30 mg/dL — ABNORMAL HIGH (ref 8–23)
CO2: 25 mmol/L (ref 22–32)
Calcium: 7.3 mg/dL — ABNORMAL LOW (ref 8.9–10.3)
Chloride: 96 mmol/L — ABNORMAL LOW (ref 98–111)
Creatinine, Ser: 2.63 mg/dL — ABNORMAL HIGH (ref 0.61–1.24)
GFR, Estimated: 25 mL/min — ABNORMAL LOW (ref >60)
Glucose, Bld: 209 mg/dL — ABNORMAL HIGH (ref 70–99)
Potassium: 3.5 mmol/L (ref 3.5–5.1)
Sodium: 134 mmol/L — ABNORMAL LOW (ref 135–145)
Total Bilirubin: 18.5 mg/dL (ref 0.3–1.2)
Total Protein: 5.3 g/dL — ABNORMAL LOW (ref 6.5–8.1)

## 2022-02-25 LAB — BODY FLUID CELL COUNT WITH DIFFERENTIAL
Eos, Fluid: 0 %
Lymphs, Fluid: 21 %
Monocyte-Macrophage-Serous Fluid: 4 % — ABNORMAL LOW (ref 50–90)
Neutrophil Count, Fluid: 75 % — ABNORMAL HIGH (ref 0–25)
Total Nucleated Cell Count, Fluid: 890 cu mm (ref 0–1000)

## 2022-02-25 LAB — AMYLASE, PLEURAL OR PERITONEAL FLUID: Amylase, Fluid: 38 U/L

## 2022-02-25 LAB — PROTEIN, PLEURAL OR PERITONEAL FLUID: Total protein, fluid: <3 g/dL

## 2022-02-25 LAB — LACTATE DEHYDROGENASE, PLEURAL OR PERITONEAL FLUID: LD, Fluid: 144 U/L — ABNORMAL HIGH (ref 3–23)

## 2022-02-25 MED ORDER — MIDAZOLAM HCL 2 MG/2ML IJ SOLN
INTRAMUSCULAR | Status: AC
  Filled 2022-02-25: qty 4

## 2022-02-25 MED ORDER — FENTANYL CITRATE (PF) 100 MCG/2ML IJ SOLN
INTRAMUSCULAR | Status: DC | PRN
  Administered 2022-02-25: 25 ug via INTRAVENOUS

## 2022-02-25 MED ORDER — LIDOCAINE HCL (PF) 1 % IJ SOLN
INTRAMUSCULAR | Status: AC
  Filled 2022-02-25: qty 30

## 2022-02-25 MED ORDER — CEFAZOLIN SODIUM-DEXTROSE 2-4 GM/100ML-% IV SOLN
2.0000 g | Freq: Once | INTRAVENOUS | Status: AC
  Administered 2022-02-25: 2 g via INTRAVENOUS
  Filled 2022-02-25: qty 100

## 2022-02-25 MED ORDER — MIDAZOLAM HCL 2 MG/2ML IJ SOLN
INTRAMUSCULAR | Status: DC | PRN
  Administered 2022-02-25: .5 mg via INTRAVENOUS

## 2022-02-25 MED ORDER — FENTANYL CITRATE (PF) 100 MCG/2ML IJ SOLN
INTRAMUSCULAR | Status: AC
  Filled 2022-02-25: qty 2

## 2022-02-25 NOTE — Procedures (Signed)
Interventional Radiology Procedure Note  Procedure:  US guided non-target liver biopsy.   US guided Paracentesis.  Indication: 1. Elevated liver enzymes 2. Ascites  Findings: Please refer to procedural dictation for full description.  Complications: None  EBL: < 10 mL  Miachel Roux, MD [phone]

## 2022-02-25 NOTE — Progress Notes (Signed)
PROGRESS NOTE    Adam Reid  WUJ:[id number] DOB: 1945-09-21 DOA: 02/24/2022 PCP: [name], MD    Brief Narrative:  76 y.o. male with a history of retroperitoneal paraganglioma, stage V CKD, chronic HFpEF, PAF who presented to the ED 8/2 with weakness, leg swelling. He was found to have junctional bradycardia with hyperkalemia, acidosis, uremia, volume overload, and elevated LFTs. Imaging revealed loculated right pleural effusion. Temporary HD catheter was inserted and HD initiated urgently on 8/3. The hospitalization has been complicated by respiratory failure, liver failure and thrombocytopenia, epistaxis, coffee-ground emesis, and anemia requiring transfusions.  -Now started on hemodialysis Monday Wednesday Friday, permacath in place.  Right upper extremity AV graft was placed 7/31. -Persistently elevated LFTs, GI following, underwent liver biopsy today.  Remains in very poor clinical status.  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 Tunnel catheter 8/7 Right upper extremity AV graft 7/31. Outpatient dialysis Monday Wednesday Friday.  Followed by nephrology. Volume status improved with dialysis.  Acute on chronic hypoxemic respiratory failure: On 2 to 3 L of oxygen at home for several months.  Multiple causes.  Stabilizing.  Respiratory status remains stable.  Acute liver failure with coagulopathy: CT scan without biliary obstruction, Dopplers negative, serological work-up including ANA, smooth muscle IgG, CMV, EBV, hepatitis panel is negative. Initially suspected of congestive hepatopathy, however with persistent elevated LFTs suspecting intrinsic liver disease. Underwent core liver biopsy today.  Loculated ascites drained and sent to lab with cell count and pathology. No evidence of metastatic disease, biopsy will be helpful.  Acute blood loss anemia on chronic anemia of iron deficiency and CKD: Received 3 units of PRBC, ESA with  hemodialysis.  Hemoglobin is stable now.  Coffee-ground emesis, history of GERD and Barrett's esophagus: On PPI.  Conservative management.  Paroxysmal A-fib with junctional bradycardia on arrival: Resolved.  On diltiazem for rate control.  Cannot be on anticoagulation.  Sepsis due to Pseudomonas UTI: Treated with cefepime.  Clinically improved.  Type 2 diabetes, well controlled with hyperglycemia: On SSI.  Continue.  Nutrition Status: Nutrition Problem: Severe Malnutrition Etiology: chronic illness (CKD now ESRD) Signs/Symptoms: severe muscle depletion, severe fat depletion Interventions: Nepro shake, MVI, Education, Liberalize Diet   Goal of care: Remains very frail and debilitated.  Multiple recent hospitalizations.  Now on hemodialysis. Severe malnutrition and failure to thrive. Newly developed progressive liver failure. Discussed in detail with patient, he deferred his discussion with his wife about goal of care. Seen by palliative care in the past and also visited by palliative care at home. She is interested to continue conversation, will consult palliative care. If patient and family agrees, he may better be served with home hospice.  DVT prophylaxis: SCDs Start: 02/11/22 0027   Code Status: Full code Family Communication: Wife at the bedside Disposition Plan: Status is: Inpatient Remains inpatient appropriate because: Inpatient procedures, worsening bilirubin     Consultants:  Nephrology Gastroenterology Palliative  Procedures:  Multiple procedures as above  Antimicrobials:  Completed antibiotics   Subjective: Patient seen in the morning rounds.  Pleasant, poor historian.  Denies any complaints. Went to examine patient again with wife at the bedside.  Patient tells me he feels tired otherwise denies any nausea vomiting, appetite is poor.  Denies any abdominal pain. He had multiple episodes of small-volume formed stool and was incontinent and that was very  upsetting to him.  Objective: Vitals:   02/25/22 1130 02/25/22 1145 02/25/22 1200 02/25/22 1215  BP: [name])  102/33 (!) 113/44 (!) 144/44 (!) 106/45  Pulse: 73 72 74 72  Resp: '13 13 18 13  '$ Temp:      TempSrc:      SpO2: 99% 99% 98% 98%  Weight:      Height:        Intake/Output Summary (Last 24 hours) at 02/25/2022 1340 Last data filed at 02/25/2022 1057 Gross per 24 hour  Intake 126.2 ml  Output 103 ml  Net 23.2 ml    Filed Weights   02/24/22 1227 02/24/22 1642 02/25/22 0309  Weight: 55.9 kg 54 kg 48.6 kg    Examination:  General exam: Frail and debilitated.  Thin and cachectic.  Chronically sick looking. Comfortable on conversation.  Falls back to sleep. Respiratory system: Clear to auscultation. Respiratory effort normal.  On 2 L oxygen. Cardiovascular system: S1 & S2 heard, RRR.  1 bilateral pedal edema. Gastrointestinal system: Abdomen is nondistended, soft and nontender. No organomegaly or masses felt. Normal bowel sounds heard. Foley catheter with clear urine. Central nervous system: Alert and oriented. No focal neurological deficits.  Generalized weakness. Permacath right IJ. AV fistula left arm.    Data Reviewed: I have personally reviewed following labs and imaging studies  CBC: Recent Labs  Lab 02/20/22 0018 02/21/22 0012 02/22/22 0151 02/23/22 0622 02/24/22 0048  WBC 14.4* 17.1* 17.7* 17.4* 15.6*  HGB 9.1* 8.9* 9.2* 9.8* 10.4*  HCT 27.3* 25.5* 27.1* 29.2* 30.3*  MCV 91.9 90.4 91.2 92.7 91.0  PLT 81* 82* 120* 117* 138*    Basic Metabolic Panel: Recent Labs  Lab 02/21/22 0012 02/22/22 0151 02/23/22 0622 02/24/22 0048 02/25/22 0039  NA 135 131* 134* 131* 134*  K 3.3* 4.5 3.8 4.5 3.5  CL 94* 92* 94* 92* 96*  CO2 '26 24 25 22 25  '$ GLUCOSE 142* 113* 99 131* 209*  BUN 31* 52* 35* 52* 30*  CREATININE 2.59* 3.68* 3.34* 4.03* 2.63*  CALCIUM 7.5* 6.7* 7.0* 6.3* 7.3*  MG  --   --   --  2.0  --   PHOS  --  9.9*  --   --   --     GFR: Estimated  Creatinine Clearance: 16.9 mL/min (A) (by C-G formula based on SCr of 2.63 mg/dL (H)). Liver Function Tests: Recent Labs  Lab 02/21/22 0012 02/22/22 0151 02/23/22 0622 02/24/22 0048 02/25/22 0039  AST 349* 360* 312* 315* 269*  ALT 130* 134* 125* 125* 122*  ALKPHOS 2,273* 2,274* 2,386* 2,297* 2,226*  BILITOT 13.9* 15.9* 16.8* 17.2* 18.5*  PROT 5.0* 4.9* 5.1* 5.1* 5.3*  ALBUMIN 2.0* 2.0* 1.8* 1.9* 1.8*    Recent Labs  Lab 02/20/22 1620  LIPASE 49    No results for input(s): "AMMONIA" in the last 168 hours. Coagulation Profile: Recent Labs  Lab 02/24/22 0048  INR 1.2    Cardiac Enzymes: No results for input(s): "CKTOTAL", "CKMB", "CKMBINDEX", "TROPONINI" in the last 168 hours. BNP (last 3 results) Recent Labs    12/10/21 1339  PROBNP 12,645*    HbA1C: No results for input(s): "HGBA1C" in the last 72 hours. CBG: Recent Labs  Lab 02/24/22 1130 02/24/22 1851 02/24/22 2107 02/25/22 0607 02/25/22 1055  GLUCAP 120* 126* 174* 73 98    Lipid Profile: No results for input(s): "CHOL", "HDL", "LDLCALC", "TRIG", "CHOLHDL", "LDLDIRECT" in the last 72 hours. Thyroid Function Tests: No results for input(s): "TSH", "T4TOTAL", "FREET4", "T3FREE", "THYROIDAB" in the last 72 hours. Anemia Panel: No results for input(s): "VITAMINB12", "FOLATE", "FERRITIN", "TIBC", "IRON", "RETICCTPCT"  in the last 72 hours. Sepsis Labs: No results for input(s): "PROCALCITON", "LATICACIDVEN" in the last 168 hours.  No results found for this or any previous visit (from the past 240 hour(s)).       Radiology Studies: US BIOPSY (LIVER)  Result Date: 02/25/2022 INDICATION: Elevated liver enzymes EXAM: Ultrasound-guided random liver biopsy MEDICATIONS: None. ANESTHESIA/SEDATION: Moderate (conscious) sedation was employed during this procedure. A total of Versed 1 mg and Fentanyl 25 mcg was administered intravenously by the radiology nurse. Total intra-service moderate Sedation Time: 17  minutes. The patient's level of consciousness and vital signs were monitored continuously by radiology nursing throughout the procedure under my direct supervision. COMPLICATIONS: None immediate. PROCEDURE: Informed written consent was obtained from the patient after a thorough discussion of the procedural risks, benefits and alternatives. All questions were addressed. Maximal Sterile Barrier Technique was utilized including caps, mask, sterile gowns, sterile gloves, sterile drape, hand hygiene and skin antiseptic. A timeout was performed prior to the initiation of the procedure. Patient position supine on the ultrasound table. Epigastric skin prepped and draped in usual sterile fashion. Following local lidocaine administration, 17 gauge introducer needle was advanced into the left hepatic lobe, and three 18 gauge cores were obtained utilizing continuous ultrasound guidance. Gel-Foam slurry was not utilized given patient's sensitivity to pork products. Samples were sent to pathology in formalin. Needle removed and hemostasis achieved with 10 minutes of manual compression. Post procedure ultrasound images showed no evidence of significant hemorrhage. IMPRESSION: Ultrasound-guided random liver biopsy. Electronically Signed   By: Miachel Roux M.D.   On: 02/25/2022 11:48   US Paracentesis  Result Date: 02/25/2022 INDICATION: 76 year old gentleman with abdominal ascites presents to IR for random liver biopsy. Paracentesis performed in order to decrease risk of hemorrhage from biopsy. EXAM: ULTRASOUND GUIDED  PARACENTESIS MEDICATIONS: None. COMPLICATIONS: None immediate. PROCEDURE: Informed written consent was obtained from the patient after a discussion of the risks, benefits and alternatives to treatment. A timeout was performed prior to the initiation of the procedure. Initial ultrasound scanning demonstrates a small amount of ascites within the right lower abdominal quadrant. The right lower abdomen was prepped  and draped in the usual sterile fashion. 1% lidocaine was used for local anesthesia. Following this, a 6 Fr Safe-T-Centesis catheter was introduced. An ultrasound image was saved for documentation purposes. The paracentesis was performed. The catheter was removed and a dressing was applied. The patient tolerated the procedure well without immediate post procedural complication. FINDINGS: A total of approximately 150 mL of blood tinged fluid was removed. Samples were sent to the laboratory as requested by the clinical team. IMPRESSION: Successful ultrasound-guided paracentesis yielding 150 mL of peritoneal fluid. Electronically Signed   By: Miachel Roux M.D.   On: 02/25/2022 11:46        Scheduled Meds:  sodium chloride   Intravenous Once   calcitRIOL  0.25 mcg Oral Q M,W,F   calcium acetate  667 mg Oral TID WC   Chlorhexidine Gluconate Cloth  6 each Topical Q0600   darbepoetin (ARANESP) injection - DIALYSIS  200 mcg Intravenous Q Sat-HD   diltiazem  180 mg Oral Daily   feeding supplement (NEPRO CARB STEADY)  237 mL Oral BID   fentaNYL       insulin aspart  0-6 Units Subcutaneous TID WC   lidocaine-EPINEPHrine  20 mL Intradermal Once   midazolam       multivitamin  1 tablet Oral QHS   pantoprazole  40 mg Oral Q0600   sodium  chloride flush  3 mL Intravenous Q12H   traZODone  50 mg Oral QHS   Continuous Infusions:  sodium chloride Stopped (02/12/22 1243)     LOS: 14 days    Time spent: 35 minutes    Barb Merino, MD [facility] Pager [phone]

## 2022-02-25 NOTE — Progress Notes (Signed)
IP rehab admissions - A peer to peer has been offered by the medical director of [facility] for acute inpatient rehab admission.  Please call before 11 am 02/26/22 to [phone] option 5 with name, date of birth and ID # [id number].  For questions please call [phone]

## 2022-02-25 NOTE — Sedation Documentation (Signed)
Procedure end

## 2022-02-25 NOTE — Progress Notes (Signed)
[facility] Progress Note  Subjective: patient seen and examined in room. Tolerated HD yesterday, net uf 3L. Liver biopsy today  Vitals:   02/24/22 1924 02/24/22 2247 02/25/22 0309 02/25/22 0722  BP: (!) 114/47 (!) 134/51 (!) 127/49 (!) 108/44  Pulse: 72 71 76 76  Resp: 14 20 (!) 24 20  Temp: 97.6 F (36.4 C) 97.8 F (36.6 C) 97.7 F (36.5 C) 97.7 F (36.5 C)  TempSrc: Oral Oral Oral Oral  SpO2: 100% 100% 100% 100%  Weight:   48.6 kg   Height:        Exam: Gen: NAD, cachectic, laying flat in bed, jaundiced, pleasant CV: RRR Resp: CTA BL Abd: soft, nt/nd Ext: no sig edema b/l LEs Neuro: awake, alert Dialysis access: RIJ TDC, RUE AVG +b/t   Recent Labs  Lab 02/22/22 0151 02/23/22 0622 02/24/22 0048 02/25/22 0039  HGB 9.2* 9.8* 10.4*  --   ALBUMIN 2.0* 1.8* 1.9* 1.8*  CALCIUM 6.7* 7.0* 6.3* 7.3*  PHOS 9.9*  --   --   --   CREATININE 3.68* 3.34* 4.03* 2.63*  K 4.5 3.8 4.5 3.5   No results for input(s): "IRON", "TIBC", "FERRITIN" in the last 168 hours. Inpatient medications:  sodium chloride   Intravenous Once   calcitRIOL  0.25 mcg Oral Q M,W,F   calcium acetate  667 mg Oral TID WC   Chlorhexidine Gluconate Cloth  6 each Topical Q0600   darbepoetin (ARANESP) injection - DIALYSIS  200 mcg Intravenous Q Sat-HD   diltiazem  180 mg Oral Daily   feeding supplement (NEPRO CARB STEADY)  237 mL Oral BID   insulin aspart  0-6 Units Subcutaneous TID WC   lidocaine-EPINEPHrine  20 mL Intradermal Once   multivitamin  1 tablet Oral QHS   pantoprazole  40 mg Oral Q0600   sodium chloride flush  3 mL Intravenous Q12H   traZODone  50 mg Oral QHS    sodium chloride Stopped (02/12/22 1243)   bisacodyl, docusate sodium, HYDROmorphone (DILAUDID) injection, lidocaine (PF), ondansetron (ZOFRAN) IV, mouth rinse, polyethylene glycol, prochlorperazine, senna, sodium chloride flush   NEW ESRD: set up for OP HD at [facility] on MWF 12:15 chair time - last hep B  labs: 8/2, non-immune  Summary: CKD V patient f/b Dr Graylon Gunning, recent AVF placement in R arm (the R AVF placed in Feb 2023 failed and on 02/08/22 had a new RUA AVG placed). Pt also recently dx'd w/ L retroperitoneal paraganglioma came to ED w/ progressive fatigue, weakness and SOB. K+ 6.6 and junctional bradycardia. Was treated for hyperkalemia and pt agreed to start dialysis due to life-threatening cardiac instability. CCM placed temp cath and pt was dialyzed. Amio/ diltiazem were held. IR saw him and placed TDC. Pt improved and was CLIP'd to [facility] on MWF. Admit complicated by pseudomonas UTI from 8/5. FTT felt to be due to ganglioma.   Assessment/ Plan: New ESRD - pt presented w/ junctional bradycardia and progressive CKD V, started HD on 8/3. Accepted to [facility] on MWF schedule. Will c/w HD on MWF schedule here A/C chronic hypoxic resp failure, acute on chronic HFpEF - wears home O2 2-3 L [place]. UF as tolerated RUE AVG - placed 7/31. Using [facility] now.  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 On calcitriol now. Switched renvela to cal acetate HTN -  BP's are on the low, getting cardizem 180 daily for afib. Pt in NSR now.  PAF - on cardizem for afib. Liver failure with coagulopathy - workup negative.  Per primary and GI. +hemosiderosis. Liver biopsy planned for 8/17 Sepsis due to pseudomonas UTI - s/p cefepime Retroperitoneal paraganglioma - to be seen at [facility] as he was deemed too high risk for surgery here. FTT - due to paraganglioma and progressive CKD.  Continue with protein supplements

## 2022-02-25 NOTE — TOC Progression Note (Signed)
Transition of Care [facility]) - Progression Note    Patient Details  Name: Charleston Hankin MRN: [id number] Date of Birth: 07-01-1946  Transition of Care [facility]) CM/SW [place], RN Phone Number:[phone]  02/25/2022, 10:22 AM  Clinical Narrative:    TOC continues to follow awaiting CIR insurance auth.      Barriers to Discharge: Continued Medical Work up  Expected Discharge Plan and Services   In-house Referral: NA Discharge Planning Services: NA Post Acute Care Choice: [place] arrangements for the past 2 months: Single Family Home                 DME Arranged: N/A DME Agency: NA       HH Arranged: PT, OT HH Agency: [place] Date [place]: 02/17/22 Time [place]: 3794 Representative spoke with at [place]: [place] ([place]) Interventions    Readmission Risk Interventions    02/17/2022    2:05 PM 12/15/2021    9:03 AM 11/16/2021    3:15 PM  Readmission Risk Prevention Plan  Transportation Screening Complete Complete Complete  PCP or Specialist Appt within 3-5 Days  Complete Complete  HRI or [place]  Complete Complete  Social Work Consult for [place] Planning/Counseling  Complete Complete  Palliative Care Screening  Not Applicable Not Applicable  Medication Review Press photographer) Complete Complete Complete  PCP or Specialist appointment within 3-5 days of discharge Complete    HRI or [place] Complete    SW Recovery Care/Counseling Consult Complete    [place] Not Applicable

## 2022-02-25 NOTE — Progress Notes (Signed)
Date and time results received: 02/25/22 0139  Test: Bilirubin Critical Value: 18.5  Name of Provider Notified: Dr. Bridgett Larsson   Orders Received? Or Actions Taken?: No new orders

## 2022-02-25 NOTE — Progress Notes (Signed)
Interventional Radiology Brief Note:  Patient s/p random liver biopsy by Dr. Dwaine Gale today. Fluid was also withdrawn during procedure. Dr. Laurena Bering notified and orders placed by him for fluid analysis.  [place] aware.   Brynda Greathouse, MS RD PA-C

## 2022-02-25 NOTE — Progress Notes (Signed)
Physical Therapy Treatment Patient Details Name: Adam Reid MRN: [id number] DOB: May 02, 1946 Today's Date: 02/25/2022   History of Present Illness Pt is a 76 y.o. male presenting 02/23/2022 with generalized weakness and bradycardia. 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 8/7 placement of a right IJ approach tunneled HD catheter. 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    PT Comments    Pt received supine, hesitant but agreeable for session, however pt limited by increased fatigue this date s/p biopsy and bedrest. Pt needing up to mod assist for bed mobility and up to max assist to power up to standing secondary to LE fatigue and weakness. Pt needing multiple attempts to successfully come to standing with RW as pt unable to generate power through LE to elevate hips for beginning trials with more success each subsequent trial until successful standing. Pt continues to demonstrate slow but steady ambulation with RW with SpO2 >95% when good pleth wave available. Pt continues to be motivated and with excellent participation and continues to benefit from skilled PT services to progress toward functional mobility goals.    Recommendations for follow up therapy are one component of a multi-disciplinary discharge planning process, led by the attending physician.  Recommendations may be updated based on patient status, additional functional criteria and insurance authorization.  Follow Up Recommendations  Acute inpatient rehab (3hours/day)     Assistance Recommended at Discharge Frequent or constant Supervision/Assistance  Patient can return home with the following A little help with walking and/or transfers;A little help with bathing/dressing/bathroom;Assistance with cooking/housework;Direct supervision/assist for medications management;Assist for transportation;Direct supervision/assist for financial management;Help with  stairs or ramp for entrance   Equipment Recommendations  None recommended by PT    Recommendations for Other Services       Precautions / Restrictions Precautions Precautions: Fall;Other (comment) Precaution Comments: HD MWF Restrictions Weight Bearing Restrictions: No     Mobility  Bed Mobility Overal bed mobility: Needs Assistance Bed Mobility: Supine to Sit, Sit to Supine     Supine to sit: Mod assist Sit to supine: Min assist   General bed mobility comments: mod assist to elate trunk and min assist to manage BLE back into bed    Transfers Overall transfer level: Needs assistance Equipment used: Rolling walker (2 wheels) Transfers: Sit to/from Stand Sit to Stand: Mod assist, Max assist           General transfer comment: mod-max asssit for repeated attemps with sucess on last attempt, pt with increased fatigue/weakness this session after biopsy    Ambulation/Gait Ambulation/Gait assistance: Min assist Gait Distance (Feet): 120 Feet Assistive device: Rolling walker (2 wheels) Gait Pattern/deviations: Step-to pattern, Decreased stride length, Trunk flexed Gait velocity: Decreased     General Gait Details: Cues for posture and RW proximity and breahting techniques   Stairs             Wheelchair Mobility    Modified Rankin (Stroke Patients Only)       Balance Overall balance assessment: Needs assistance Sitting-balance support: No upper extremity supported, Feet supported Sitting balance-Leahy Scale: Good Sitting balance - Comments: while seated and attempting to doff /donn socks he lost balance posteriorly and right   Standing balance support: Bilateral upper extremity supported, During functional activity, Reliant on assistive device for balance Standing balance-Leahy Scale: Poor Standing balance comment: relies on external support, able to engage in grooming with 1 hand support briefly  but fatigues easily                             Cognition Arousal/Alertness: Awake/alert Behavior During Therapy: Flat affect Overall Cognitive Status: Within Functional Limits for tasks assessed                                 General Comments: WFL for simple tasks        Exercises      General Comments General comments (skin integrity, edema, etc.): VSS on 2 L (poor pleth when walking but when hand relaxed 94%)      Pertinent Vitals/Pain Pain Assessment Pain Assessment: No/denies pain Pain Intervention(s): Monitored during session    Home Living                          Prior Function            PT Goals (current goals can now be found in the care plan section) Acute Rehab PT Goals PT Goal Formulation: With patient Time For Goal Achievement: 02/28/22    Frequency    Min 3X/week      PT Plan Current plan remains appropriate    Co-evaluation              AM-PAC PT "6 Clicks" Mobility   Outcome Measure  Help needed turning from your back to your side while in a flat bed without using bedrails?: A Little Help needed moving from lying on your back to sitting on the side of a flat bed without using bedrails?: A Little Help needed moving to and from a bed to a chair (including a wheelchair)?: A Little Help needed standing up from a chair using your arms (e.g., wheelchair or bedside chair)?: A Lot Help needed to walk in hospital room?: A Little Help needed climbing 3-5 steps with a railing? : A Lot 6 Click Score: 16    End of Session Equipment Utilized During Treatment: Oxygen;Gait belt Activity Tolerance: Patient limited by fatigue Patient left: in bed;with call bell/phone within reach;with bed alarm set Nurse Communication: Mobility status PT Visit Diagnosis: Other abnormalities of gait and mobility (R26.89);Muscle weakness (generalized) (M62.81)     Time: 8546-2703 PT Time Calculation (min) (ACUTE ONLY): 29 min  Charges:  $Gait Training: 8-22 mins $Therapeutic  Activity: 8-22 mins                    Jamille Fisher R. PTA Acute Rehabilitation Services Office: [place] 02/25/2022, 4:16 PM

## 2022-02-25 NOTE — Progress Notes (Addendum)
OT Cancellation Note  Patient Details Name: Adam Reid MRN: [id number] DOB: 02/14/1946   Cancelled Treatment:    Reason Eval/Treat Not Completed: Patient at procedure or test/ unavailable- off unit at procedure, RN reporting 2 hrs bedrest once pt returns. Will follow as see as able.   Jolaine Artist, OT Acute Rehabilitation Services Office [phone]   Delight Stare 02/25/2022, 10:33 AM

## 2022-02-26 DIAGNOSIS — Z515 Encounter for palliative care: Secondary | ICD-10-CM

## 2022-02-26 DIAGNOSIS — Z66 Do not resuscitate: Secondary | ICD-10-CM | POA: Diagnosis not present

## 2022-02-26 DIAGNOSIS — Z7189 Other specified counseling: Secondary | ICD-10-CM | POA: Diagnosis not present

## 2022-02-26 DIAGNOSIS — N186 End stage renal disease: Secondary | ICD-10-CM | POA: Diagnosis not present

## 2022-02-26 DIAGNOSIS — Z992 Dependence on renal dialysis: Secondary | ICD-10-CM | POA: Diagnosis not present

## 2022-02-26 LAB — COMPREHENSIVE METABOLIC PANEL
ALT: 94 U/L — ABNORMAL HIGH (ref 0–44)
AST: 245 U/L — ABNORMAL HIGH (ref 15–41)
Albumin: 1.9 g/dL — ABNORMAL LOW (ref 3.5–5.0)
Alkaline Phosphatase: 2178 U/L — ABNORMAL HIGH (ref 38–126)
Anion gap: 16 — ABNORMAL HIGH (ref 5–15)
BUN: 51 mg/dL — ABNORMAL HIGH (ref 8–23)
CO2: 22 mmol/L (ref 22–32)
Calcium: 7.1 mg/dL — ABNORMAL LOW (ref 8.9–10.3)
Chloride: 97 mmol/L — ABNORMAL LOW (ref 98–111)
Creatinine, Ser: 3.84 mg/dL — ABNORMAL HIGH (ref 0.61–1.24)
GFR, Estimated: 16 mL/min — ABNORMAL LOW (ref >60)
Glucose, Bld: 128 mg/dL — ABNORMAL HIGH (ref 70–99)
Potassium: 4 mmol/L (ref 3.5–5.1)
Sodium: 135 mmol/L (ref 135–145)
Total Bilirubin: 20.2 mg/dL (ref 0.3–1.2)
Total Protein: 5.2 g/dL — ABNORMAL LOW (ref 6.5–8.1)

## 2022-02-26 LAB — GLUCOSE, CAPILLARY
Glucose-Capillary: 106 mg/dL — ABNORMAL HIGH (ref 70–99)
Glucose-Capillary: 96 mg/dL (ref 70–99)

## 2022-02-26 LAB — CYTOLOGY - NON PAP

## 2022-02-26 NOTE — Consult Note (Signed)
Palliative Medicine Inpatient Consult Note  Consulting Provider: Barb Merino, MD  Reason for consult:   Adam Reid Palliative Medicine Consult  Reason for Consult? goal of care , co ordination   02/26/2022  HPI:  Per intake H&P --> 76 y.o. male with a history of retroperitoneal paraganglioma, stage V CKD, chronic HFpEF, PAF who presented to the ED 8/2 with weakness, leg swelling. He was found to have junctional bradycardia with hyperkalemia, acidosis, uremia, volume overload, and elevated LFTs. Imaging revealed loculated right pleural effusion. Temporary HD catheter was inserted and HD initiated urgently on 8/3. The hospitalization has been complicated by respiratory failure, liver failure and thrombocytopenia, epistaxis, coffee-ground emesis, and anemia requiring transfusions.   Palliative care has been asked to get involved to have ongoing goals of care conversations in the setting of patients declining health state. PMT had seen Adam Reid extensively from 6/4-6/17.  Clinical Assessment/Goals of Care:  *Please note that this is a verbal dictation therefore any spelling or grammatical errors are due to the "[place]" system interpretation.  I have reviewed medical records including EPIC notes, labs and imaging, received report from bedside RN, assessed the patient who is lying in bed in no acute distress.    I called patient's wife, Adam Reid this morning to further discuss diagnosis prognosis, GOC, EOL wishes, disposition and options.   I introduced Palliative Medicine as specialized medical care for people living with serious illness. It focuses on providing relief from the symptoms and stress of a serious illness. The goal is to improve quality of life for both the patient and the family.  Medical History Review and Understanding:  A review of Adam Reid's past medical history inclusive of his retroperitoneal para glioma for which she was seen at [facility] in the oncology department, end-stage renal disease requiring imminent initiation of hemodialysis during this admission, severe heart failure, atrial fibrillation, and failure to thrive was held.  Social History:  Adam Reid is from [place] originally.  He moved to [place], [place] 15 years ago.  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  He is married to his spouse, Adam Reid. He shares that he has one son who lives in [place] and is an Optomitrist. he is a man of faith and practices within the Muslim denomination - though he is not strictly Muslim and his belief system.   Functional and Nutritional State:  Per patient's wife prior to patient's hospital admission he was noted in the home to be sleeping most of the time and overall was fairly weak with mobility.  His wife shares that his appetite was variable some days better than others.  He was able to mobilize on his own though he was slow.  Advance Directives:  A detailed discussion was had today regarding advanced directives.  Patient's spouse, Adam Reid is his Air traffic controller.  Code Status:  Encouraged patient/family to consider DNR/DNI status understanding evidenced based poor outcomes in similar hospitalized patient, as the cause of arrest is likely associated with advanced chronic/terminal illness rather than an easily reversible acute cardio-pulmonary event. I explained that DNR/DNI does not change the medical plan and it only comes into effect after a person has arrested (died).  It is a protective measure to keep [place] from harming the patient in their last moments of life. Adam Reid was agreeable to DNR/DNI with understanding that patient would not receive CPR, defibrillation, ACLS medications, or intubation.   Discussion:  A  formal review of Adam Reid's prolonged hospitalization was held.  Patient's wife expresses that prior to hospitalization he was noted to have a pulse rate of 35 which is  why he was rushed to the hospital by EMS.  At that time he was found to be floridly volume overloaded and was told that hemodialysis would need to be started sooner than later.  Patient's wife shares that everything happened very quickly and she is not quite certain if Adam Reid was mentally prepared to start hemodialysis at that point.  We discussed the multisystem organ dysfunction that Adam Reid has experienced since hospitalization.  We reviewed that his ongoing transaminitis is worrisome and likely indicative of poor long-term outcomes.  Discussed the way each organ system is connected and how if 1 deteriorates that can lead to another deteriorating causing a domino effect.  Patient's wife understood this and expresses she herself feels very overwhelmed.  She shares that her son has recommended in the past that she get counseling.  I was able to offer emotional support through therapeutic listening.  Discussed that for the time being we will continue with full scope of medical care inclusive of dialysis and allow time for outcomes.  Patient's spouse shares she is hopeful that Braian will get accepted to acute rehabilitation.  Discussed that if for any reason Adam Reid should not thrive then it would be a conversation more geared towards comfort and hospice care.  Patient's spouse appreciated the frank nature of her conversation.  She is requested that my colleague Adam Reid continue to follow in the oncoming week  Discussed the importance of continued conversation with family and their  medical providers regarding overall plan of care and treatment options, ensuring decisions are within the context of the patients values and GOCs.  Decision Maker: Adam Reid (Spouse): [phone] (Mobile)  SUMMARY OF RECOMMENDATIONS   DNAR/DNI --> a formal review of full cardiopulmonary resuscitation would look like in Tiant's case was held and the determination was to not pursue these measures though to continue  current scope of medical care  Continue with present treatments --> allow time for outcomes  Patient is hopeful to transition to acute rehabilitation  Palliative care will continue to follow and offer both patient and his family support --> if transition to rehab happens in the oncoming days please ensure palliative is reconsulted  My colleague, Adam Reid will see Christropher on Sunday  Code Status/Advance Care Planning: DNAR/DNI   Palliative Prophylaxis:  Aspiration, Bowel Regimen, Delirium Protocol, Frequent Pain Assessment, Oral Care, Palliative Wound Care, and Turn Reposition  Additional Recommendations (Limitations, Scope, Preferences): Continue with present scope of medical care  Psycho-social/Spiritual:  Desire for further Chaplaincy support: Not at this time Additional Recommendations: Provided patient's wife education on multisystem organ dysfunction   Prognosis: Exceptionally limited given patient's recurrent rehospitalization's, chronic comorbidities, hypoalbuminemia, worsening frailty.  Patient's 6 to 34-monthmortality risk is quite high.  Discharge Planning: Discharge plan will likely be to acute rehabilitation.  Vitals:   02/25/22 2305 02/26/22 0450  BP: (!) 112/53 (!) 117/48  Pulse: 76 74  Resp: 20 14  Temp: (!) 97.4 F (36.3 C) (!) 97.4 F (36.3 C)  SpO2: 100% 100%    Intake/Output Summary (Last 24 hours) at 02/26/2022 0715 Last data filed at 02/26/2022 0450 Gross per 24 hour  Intake 363 ml  Output 10 ml  Net 353 ml   Last Weight  Most recent update: 02/26/2022  5:06 AM    Weight  48.8 kg (107 lb 9.4  oz)            Gen: Frail elderly [place] male in NAD HEENT: Sclera jaundiced, moist mucous membranes CV: Regular rate and rhythm PULM: On 3L nasal cannula, breathing is even and nonlabored ABD: Soft, nontender EXT: Bilateral lower extremity edema  Neuro: Alert and oriented x3-Farsi speaking  PPS: 20%   This conversation/these recommendations  were discussed with patient primary care team, Dr. Sloan Leiter  Billing based on MDM: High  Problems Addressed: One acute or chronic illness or injury that poses a threat to life or bodily function  Amount and/or Complexity of Data: Category 3:Discussion of management or test interpretation with external physician/other qualified health care professional/appropriate source (not separately reported)  Risks: Decision regarding hospitalization or escalation of hospital care and Decision not to resuscitate or to de-escalate care because of poor prognosis ______________________________________________________ [place] Team Team Cell Phone: [phone] Please utilize secure chat with additional questions, if there is no response within 30 minutes please call the above phone number  Palliative Medicine Team providers are available by phone from 7am to 7pm daily and can be reached through the team cell phone.  Should this patient require assistance outside of these hours, please call the patient's attending physician.

## 2022-02-26 NOTE — Progress Notes (Signed)
[facility] Progress Note  Subjective: patient seen and examined in room. No acute events, no complaints. S/p liver biopsy yesterday.  Vitals:   02/25/22 1950 02/25/22 2305 02/26/22 0450 02/26/22 0502  BP: (!) 125/54 (!) 112/53 (!) 117/48   Pulse: 73 76 74   Resp: '14 20 14   '$ Temp: (!) 97.5 F (36.4 C) (!) 97.4 F (36.3 C) (!) 97.4 F (36.3 C)   TempSrc: Oral Oral Oral   SpO2: 100% 100% 100%   Weight:    48.8 kg  Height:        Exam: Gen: NAD, cachectic, laying flat in bed, jaundiced, pleasant CV: RRR Resp: CTA BL Abd: soft, nt/nd Ext: very trace edema b/l LEs Neuro: awake, alert Dialysis access: RIJ TDC, RUE AVG +b/t   Recent Labs  Lab 02/22/22 0151 02/23/22 0622 02/24/22 0048 02/25/22 0039 02/26/22 0045  HGB 9.2* 9.8* 10.4*  --   --   ALBUMIN 2.0* 1.8* 1.9* 1.8* 1.9*  CALCIUM 6.7* 7.0* 6.3* 7.3* 7.1*  PHOS 9.9*  --   --   --   --   CREATININE 3.68* 3.34* 4.03* 2.63* 3.84*  K 4.5 3.8 4.5 3.5 4.0   No results for input(s): "IRON", "TIBC", "FERRITIN" in the last 168 hours. Inpatient medications:  sodium chloride   Intravenous Once   calcitRIOL  0.25 mcg Oral Q M,W,F   calcium acetate  667 mg Oral TID WC   Chlorhexidine Gluconate Cloth  6 each Topical Q0600   darbepoetin (ARANESP) injection - DIALYSIS  200 mcg Intravenous Q Sat-HD   diltiazem  180 mg Oral Daily   feeding supplement (NEPRO CARB STEADY)  237 mL Oral BID   insulin aspart  0-6 Units Subcutaneous TID WC   lidocaine-EPINEPHrine  20 mL Intradermal Once   multivitamin  1 tablet Oral QHS   pantoprazole  40 mg Oral Q0600   sodium chloride flush  3 mL Intravenous Q12H   traZODone  50 mg Oral QHS    sodium chloride Stopped (02/12/22 1243)   fentaNYL, HYDROmorphone (DILAUDID) injection, lidocaine (PF), midazolam, ondansetron (ZOFRAN) IV, mouth rinse, prochlorperazine, senna, sodium chloride flush   NEW ESRD: set up for OP HD at [facility] on MWF 12:15 chair time - last hep B labs:  8/2, non-immune  Summary: CKD V patient f/b Dr Graylon Gunning, recent AVF placement in R arm (the R AVF placed in Feb 2023 failed and on 02/08/22 had a new RUA AVG placed). Pt also recently dx'd w/ L retroperitoneal paraganglioma came to ED w/ progressive fatigue, weakness and SOB. K+ 6.6 and junctional bradycardia. Was treated for hyperkalemia and pt agreed to start dialysis due to life-threatening cardiac instability. CCM placed temp cath and pt was dialyzed. Amio/ diltiazem were held. IR saw him and placed TDC. Pt improved and was CLIP'd to [facility] on MWF. Admit complicated by pseudomonas UTI from 8/5. FTT felt to be due to ganglioma.   Assessment/ Plan: New ESRD - pt presented w/ junctional bradycardia and progressive CKD V, started HD on 8/3. Accepted to [facility] on MWF schedule. Will c/w HD on MWF schedule here A/C chronic hypoxic resp failure, acute on chronic HFpEF - wears home O2 2-3 L [place]. UF as tolerated RUE AVG - placed 7/31. Using [facility] now.  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 On calcitriol  now. Switched renvela to cal acetate. CCa now 8.8 today HTN - BP's are on the low, getting cardizem 180 daily for afib. Pt in NSR now.  PAF - on cardizem for afib. Liver failure with coagulopathy - workup negative.  Per primary and GI. +hemosiderosis. Liver biopsy 8/17 Sepsis due to pseudomonas UTI - s/p cefepime Retroperitoneal paraganglioma - to be seen at [facility] as he was deemed too high risk for surgery here. Palliative consulted FTT - due to paraganglioma and progressive CKD.  Continue with protein supplements

## 2022-02-26 NOTE — Progress Notes (Signed)
PROGRESS NOTE    Adam Reid  PNT:[id number] DOB: 09-27-1945 DOA: 02/27/2022 PCP: [name], MD    Brief Narrative:  76 y.o. male with a history of retroperitoneal paraganglioma, stage V CKD, chronic HFpEF, PAF who presented to the ED 8/2 with weakness, leg swelling. He was found to have junctional bradycardia with hyperkalemia, acidosis, uremia, volume overload, and elevated LFTs. Imaging revealed loculated right pleural effusion. Temporary HD catheter was inserted and HD initiated urgently on 8/3. The hospitalization has been complicated by respiratory failure, liver failure and thrombocytopenia, epistaxis, coffee-ground emesis, and anemia requiring transfusions.  -Now started on hemodialysis Monday Wednesday Friday, permacath in place.  Right upper extremity AV graft was placed 7/31. -Persistently elevated bilirubin.  Liver punch biopsy and ascitic fluid analysis 8/17.  Remains in very poor clinical status.  Frail debilitated and malnourished.  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 Tunnel catheter 8/7 Right upper extremity AV graft 7/31. Outpatient dialysis Monday Wednesday Friday.  Followed by nephrology. Volume status improved with dialysis.  Acute on chronic hypoxemic respiratory failure: On 2 to 3 L of oxygen at home for several months.  Multiple causes.  Stabilizing.  Respiratory status remains stable.  Acute liver failure with coagulopathy: Transaminases are improving, bilirubin is worsening. CT scan without biliary obstruction, Dopplers negative, serological work-up including ANA, smooth muscle IgG, CMV, EBV, hepatitis panel is negative. Initially suspected of congestive hepatopathy, however with persistent elevated LFTs suspecting intrinsic liver disease. Underwent core liver biopsy 8/17.  Results pending.   Paracentesis with bloody fluid, cultures pending. No evidence of metastatic disease, biopsy will be helpful.  Acute blood loss  anemia on chronic anemia of iron deficiency and CKD: Received 3 units of PRBC, ESA with hemodialysis.  Hemoglobin is stable now.  Coffee-ground emesis, history of GERD and Barrett's esophagus: On PPI.  Conservative management.  Paroxysmal A-fib with junctional bradycardia on arrival: Resolved.  On diltiazem for rate control.  Cannot be on anticoagulation.  Sepsis due to Pseudomonas UTI: Treated with cefepime.  Clinically improved.  Type 2 diabetes, well controlled with hyperglycemia: On SSI.  Continue.  Nutrition Status: Nutrition Problem: Severe Malnutrition Etiology: chronic illness (CKD now ESRD) Signs/Symptoms: severe muscle depletion, severe fat depletion Interventions: Nepro shake, MVI, Education, Liberalize Diet   Goal of care: Remains very frail and debilitated.  Multiple recent hospitalizations.  Now on hemodialysis. Severe malnutrition and failure to thrive. Newly developed progressive liver failure. Updated patient's wife. She is interested to continue conversation, will consult palliative care. If patient and family agrees, he may better be served with home hospice.  DVT prophylaxis: SCDs Start: 02/11/22 0027   Code Status: Full code Family Communication: Wife at the bedside 8/17.  Will update. Disposition Plan: Status is: Inpatient Remains inpatient appropriate because: Inpatient procedures, worsening bilirubin     Consultants:  Nephrology Gastroenterology Palliative  Procedures:  Multiple procedures as above  Antimicrobials:  Completed antibiotics   Subjective: Seen examined.  Today more awake and interactive.  He tells me that he has no complaints today.  Objective: Vitals:   02/25/22 2305 02/26/22 0450 02/26/22 0502 02/26/22 0850  BP: (!) 112/53 (!) 117/48  (!) 125/54  Pulse: 76 74    Resp: '20 14  19  '$ Temp: (!) 97.4 F (36.3 C) (!) 97.4 F (36.3 C)  97.6 F (36.4 C)  TempSrc: Oral Oral  Oral  SpO2: 100% 100%    Weight:   48.8 kg   Height:  Intake/Output Summary (Last 24 hours) at 02/26/2022 0931 Last data filed at 02/26/2022 0909 Gross per 24 hour  Intake 366 ml  Output 10 ml  Net 356 ml    Filed Weights   02/24/22 1642 02/25/22 0309 02/26/22 0502  Weight: 54 kg 48.6 kg 48.8 kg    Examination:  General exam: Frail and debilitated.  Thin and cachectic.  Icteric.  Alert awake and oriented.  Not in any distress today. Respiratory system: Clear to auscultation. Respiratory effort normal.  On 2 L oxygen. Cardiovascular system: S1 & S2 heard, RRR.  1 bilateral pedal edema. Gastrointestinal system: Abdomen is nondistended, soft and nontender. No organomegaly or masses felt. Normal bowel sounds heard. Foley catheter with clear urine. Central nervous system: Alert and oriented. No focal neurological deficits.  Generalized weakness. Permacath right IJ. AV fistula right arm looks clean and dry.  Sutures intact.    Data Reviewed: I have personally reviewed following labs and imaging studies  CBC: Recent Labs  Lab 02/20/22 0018 02/21/22 0012 02/22/22 0151 02/23/22 0622 02/24/22 0048  WBC 14.4* 17.1* 17.7* 17.4* 15.6*  HGB 9.1* 8.9* 9.2* 9.8* 10.4*  HCT 27.3* 25.5* 27.1* 29.2* 30.3*  MCV 91.9 90.4 91.2 92.7 91.0  PLT 81* 82* 120* 117* 138*    Basic Metabolic Panel: Recent Labs  Lab 02/22/22 0151 02/23/22 0622 02/24/22 0048 02/25/22 0039 02/26/22 0045  NA 131* 134* 131* 134* 135  K 4.5 3.8 4.5 3.5 4.0  CL 92* 94* 92* 96* 97*  CO2 '24 25 22 25 22  '$ GLUCOSE 113* 99 131* 209* 128*  BUN 52* 35* 52* 30* 51*  CREATININE 3.68* 3.34* 4.03* 2.63* 3.84*  CALCIUM 6.7* 7.0* 6.3* 7.3* 7.1*  MG  --   --  2.0  --   --   PHOS 9.9*  --   --   --   --     GFR: Estimated Creatinine Clearance: 11.6 mL/min (A) (by C-G formula based on SCr of 3.84 mg/dL (H)). Liver Function Tests: Recent Labs  Lab 02/22/22 0151 02/23/22 0622 02/24/22 0048 02/25/22 0039 02/26/22 0045  AST 360* 312* 315* 269* 245*  ALT 134*  125* 125* 122* 94*  ALKPHOS 2,274* 2,386* 2,297* 2,226* 2,178*  BILITOT 15.9* 16.8* 17.2* 18.5* 20.2*  PROT 4.9* 5.1* 5.1* 5.3* 5.2*  ALBUMIN 2.0* 1.8* 1.9* 1.8* 1.9*    Recent Labs  Lab 02/20/22 1620  LIPASE 49    No results for input(s): "AMMONIA" in the last 168 hours. Coagulation Profile: Recent Labs  Lab 02/24/22 0048  INR 1.2    Cardiac Enzymes: No results for input(s): "CKTOTAL", "CKMB", "CKMBINDEX", "TROPONINI" in the last 168 hours. BNP (last 3 results) Recent Labs    12/10/21 1339  PROBNP 12,645*    HbA1C: No results for input(s): "HGBA1C" in the last 72 hours. CBG: Recent Labs  Lab 02/25/22 0607 02/25/22 1055 02/25/22 1839 02/25/22 2151 02/26/22 0603  GLUCAP 73 98 187* 125* 106*    Lipid Profile: No results for input(s): "CHOL", "HDL", "LDLCALC", "TRIG", "CHOLHDL", "LDLDIRECT" in the last 72 hours. Thyroid Function Tests: No results for input(s): "TSH", "T4TOTAL", "FREET4", "T3FREE", "THYROIDAB" in the last 72 hours. Anemia Panel: No results for input(s): "VITAMINB12", "FOLATE", "FERRITIN", "TIBC", "IRON", "RETICCTPCT" in the last 72 hours. Sepsis Labs: No results for input(s): "PROCALCITON", "LATICACIDVEN" in the last 168 hours.  Recent Results (from the past 240 hour(s))  Body fluid culture w Gram Stain     Status: None (Preliminary result)   Collection  Time: 02/25/22 11:07 AM   Specimen: Peritoneal Washings; Pleural Fluid  Result Value Ref Range Status   Specimen Description PERITONEAL  Final   Special Requests NONE  Final   Gram Stain   Final    WBC PRESENT, PREDOMINANTLY MONONUCLEAR NO ORGANISMS SEEN    Culture   Final    NO GROWTH < 24 HOURS Performed at [facility] [address], [place], [place] [address]    Report Status PENDING  Incomplete         Radiology Studies: US BIOPSY (LIVER)  Result Date: 02/25/2022 INDICATION: Elevated liver enzymes EXAM: Ultrasound-guided random liver biopsy MEDICATIONS: None.  ANESTHESIA/SEDATION: Moderate (conscious) sedation was employed during this procedure. A total of Versed 1 mg and Fentanyl 25 mcg was administered intravenously by the radiology nurse. Total intra-service moderate Sedation Time: 17 minutes. The patient's level of consciousness and vital signs were monitored continuously by radiology nursing throughout the procedure under my direct supervision. COMPLICATIONS: None immediate. PROCEDURE: Informed written consent was obtained from the patient after a thorough discussion of the procedural risks, benefits and alternatives. All questions were addressed. Maximal Sterile Barrier Technique was utilized including caps, mask, sterile gowns, sterile gloves, sterile drape, hand hygiene and skin antiseptic. A timeout was performed prior to the initiation of the procedure. Patient position supine on the ultrasound table. Epigastric skin prepped and draped in usual sterile fashion. Following local lidocaine administration, 17 gauge introducer needle was advanced into the left hepatic lobe, and three 18 gauge cores were obtained utilizing continuous ultrasound guidance. Gel-Foam slurry was not utilized given patient's sensitivity to pork products. Samples were sent to pathology in formalin. Needle removed and hemostasis achieved with 10 minutes of manual compression. Post procedure ultrasound images showed no evidence of significant hemorrhage. IMPRESSION: Ultrasound-guided random liver biopsy. Electronically Signed   By: Miachel Roux M.D.   On: 02/25/2022 11:48   US Paracentesis  Result Date: 02/25/2022 INDICATION: 76 year old gentleman with abdominal ascites presents to IR for random liver biopsy. Paracentesis performed in order to decrease risk of hemorrhage from biopsy. EXAM: ULTRASOUND GUIDED  PARACENTESIS MEDICATIONS: None. COMPLICATIONS: None immediate. PROCEDURE: Informed written consent was obtained from the patient after a discussion of the risks, benefits and  alternatives to treatment. A timeout was performed prior to the initiation of the procedure. Initial ultrasound scanning demonstrates a small amount of ascites within the right lower abdominal quadrant. The right lower abdomen was prepped and draped in the usual sterile fashion. 1% lidocaine was used for local anesthesia. Following this, a 6 Fr Safe-T-Centesis catheter was introduced. An ultrasound image was saved for documentation purposes. The paracentesis was performed. The catheter was removed and a dressing was applied. The patient tolerated the procedure well without immediate post procedural complication. FINDINGS: A total of approximately 150 mL of blood tinged fluid was removed. Samples were sent to the laboratory as requested by the clinical team. IMPRESSION: Successful ultrasound-guided paracentesis yielding 150 mL of peritoneal fluid. Electronically Signed   By: Miachel Roux M.D.   On: 02/25/2022 11:46        Scheduled Meds:  sodium chloride   Intravenous Once   calcitRIOL  0.25 mcg Oral Q M,W,F   calcium acetate  667 mg Oral TID WC   Chlorhexidine Gluconate Cloth  6 each Topical Q0600   darbepoetin (ARANESP) injection - DIALYSIS  200 mcg Intravenous Q Sat-HD   diltiazem  180 mg Oral Daily   feeding supplement (NEPRO CARB STEADY)  237 mL  Oral BID   insulin aspart  0-6 Units Subcutaneous TID WC   lidocaine-EPINEPHrine  20 mL Intradermal Once   multivitamin  1 tablet Oral QHS   pantoprazole  40 mg Oral Q0600   sodium chloride flush  3 mL Intravenous Q12H   traZODone  50 mg Oral QHS   Continuous Infusions:  sodium chloride Stopped (02/12/22 1243)     LOS: 15 days    Time spent: 35 minutes    Barb Merino, MD [facility] Pager [phone]

## 2022-02-26 NOTE — Progress Notes (Signed)
Mobility Specialist Progress Note    02/26/22 1147  Mobility  Activity Refused mobility   Pt stated he did not want to d/t HD. Will f/u as schedule permits.   Adam Reid Mobility Specialist

## 2022-02-26 NOTE — Telephone Encounter (Signed)
Chart reviewed pt still in hospital

## 2022-02-26 NOTE — Progress Notes (Signed)
Occupational Therapy Treatment Patient Details Name: Adam Reid MRN: [id number] DOB: 03/28/1946 Today's Date: 02/26/2022   History of present illness Pt is a 76 y.o. male presenting 03/01/2022 with generalized weakness and bradycardia. 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 8/7 placement of a right IJ approach tunneled HD catheter. 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   OT comments  This 76 yo male seen today and agreed to work with me with encouragement via the video interpreter. He was able to ambulate x2 20 feet with RW but then would not do anything more with me ("too tired"). He will continue to benefit from acute OT with follow up on AIR.   Recommendations for follow up therapy are one component of a multi-disciplinary discharge planning process, led by the attending physician.  Recommendations may be updated based on patient status, additional functional criteria and insurance authorization.    Follow Up Recommendations  Acute inpatient rehab (3hours/day)    Assistance Recommended at Discharge Frequent or constant Supervision/Assistance  Patient can return home with the following  A little help with walking and/or transfers;A lot of help with bathing/dressing/bathroom;Assistance with cooking/housework;Help with stairs or ramp for entrance;Assist for transportation;Direct supervision/assist for financial management;Direct supervision/assist for medications management   Equipment Recommendations  None recommended by OT       Precautions / Restrictions Precautions Precautions: Fall Precaution Comments: HD MWF Restrictions Weight Bearing Restrictions: No       Mobility Bed Mobility Overal bed mobility: Needs Assistance Bed Mobility: Rolling, Sidelying to Sit Rolling: Min assist Sidelying to sit: Mod assist, HOB elevated            Transfers Overall transfer level: Needs  assistance Equipment used: Rolling walker (2 wheels) Transfers: Sit to/from Stand Sit to Stand: Mod assist           General transfer comment: VCs for safe hand placement     Balance Overall balance assessment: Needs assistance Sitting-balance support: No upper extremity supported, Feet supported Sitting balance-Leahy Scale: Good     Standing balance support: Bilateral upper extremity supported, During functional activity, Reliant on assistive device for balance Standing balance-Leahy Scale: Poor                             ADL either performed or assessed with clinical judgement   ADL Overall ADL's : Needs assistance/impaired                         Toilet Transfer: Minimal assistance;Ambulation;Rolling walker (2 wheels) Toilet Transfer Details (indicate cue type and reason): simulated bed>hallway x2 with rest in between                Extremity/Trunk Assessment Upper Extremity Assessment Upper Extremity Assessment: Generalized weakness            Vision Baseline Vision/History: 0 No visual deficits Patient Visual Report: No change from baseline            Cognition Arousal/Alertness: Awake/alert Behavior During Therapy: Flat affect Overall Cognitive Status: Within Functional Limits for tasks assessed  Pertinent Vitals/ Pain       Pain Assessment Pain Assessment: Faces Faces Pain Scale: Hurts little more Pain Location: back with getting up to EOB Pain Descriptors / Indicators: Grimacing, Guarding, Moaning Pain Intervention(s): Limited activity within patient's tolerance, Monitored during session, Repositioned         Frequency  Min 2X/week        Progress Toward Goals  OT Goals(current goals can now be found in the care plan section)   No--waxes and wanes in his abilities/endurance for activity.  Acute Rehab OT Goals Patient Stated Goal: to  not get up and move today (but he finally did with encourgement through intrepreter) OT Goal Formulation: With patient Time For Goal Achievement: 03/01/22 Potential to Achieve Goals: Good  Plan         AM-PAC OT "6 Clicks" Daily Activity     Outcome Measure   Help from another person eating meals?: None Help from another person taking care of personal grooming?: A Little Help from another person toileting, which includes using toliet, bedpan, or urinal?: A Lot Help from another person bathing (including washing, rinsing, drying)?: A Lot Help from another person to put on and taking off regular upper body clothing?: A Lot Help from another person to put on and taking off regular lower body clothing?: A Lot 6 Click Score: 15    End of Session Equipment Utilized During Treatment: Gait belt;Rolling walker (2 wheels);Oxygen (2 liters)  OT Visit Diagnosis: Unsteadiness on feet (R26.81);Other abnormalities of gait and mobility (R26.89);Pain Pain - part of body:  (back)   Activity Tolerance Patient limited by fatigue   Patient Left in bed;with call bell/phone within reach;with bed alarm set   Nurse Communication Mobility status        Time: 6256-3893 OT Time Calculation (min): 21 min  Charges: OT General Charges $OT Visit: 1 Visit OT Treatments $Self Care/Home Management : 8-22 mins  Golden Circle, OTR/L [facility] Re[facility] [phone] Office [phone]    Almon Register 02/26/2022, 4:34 PM

## 2022-02-26 NOTE — Progress Notes (Signed)
Date and time results received: 02/26/22 0213  Test: Bilirubin Critical Value: 20.2  Name of Provider Notified: Dr. Bridgett Larsson  Orders Received? Or Actions Taken?: No new orders

## 2022-02-27 DIAGNOSIS — N186 End stage renal disease: Secondary | ICD-10-CM | POA: Diagnosis not present

## 2022-02-27 DIAGNOSIS — Z992 Dependence on renal dialysis: Secondary | ICD-10-CM | POA: Diagnosis not present

## 2022-02-27 LAB — CBC WITH DIFFERENTIAL/PLATELET
Abs Immature Granulocytes: 0.23 10*3/uL — ABNORMAL HIGH (ref 0.00–0.07)
Abs Immature Granulocytes: 0.21 10*3/uL — ABNORMAL HIGH (ref 0.00–0.07)
Basophils Absolute: 0 10*3/uL (ref 0.0–0.1)
Basophils Absolute: 0.1 10*3/uL (ref 0.0–0.1)
Basophils Relative: 0 %
Basophils Relative: 0 %
Eosinophils Absolute: 0.1 10*3/uL (ref 0.0–0.5)
Eosinophils Absolute: 0 10*3/uL (ref 0.0–0.5)
Eosinophils Relative: 1 %
Eosinophils Relative: 0 %
HCT: 27.1 % — ABNORMAL LOW (ref 39.0–52.0)
HCT: 31 % — ABNORMAL LOW (ref 39.0–52.0)
Hemoglobin: 9.2 g/dL — ABNORMAL LOW (ref 13.0–17.0)
Hemoglobin: 10.5 g/dL — ABNORMAL LOW (ref 13.0–17.0)
Immature Granulocytes: 2 %
Immature Granulocytes: 2 %
Lymphocytes Relative: 2 %
Lymphocytes Relative: 2 %
Lymphs Abs: 0.3 10*3/uL — ABNORMAL LOW (ref 0.7–4.0)
Lymphs Abs: 0.2 10*3/uL — ABNORMAL LOW (ref 0.7–4.0)
MCH: 31.2 pg (ref 26.0–34.0)
MCH: 30.6 pg (ref 26.0–34.0)
MCHC: 33.9 g/dL (ref 30.0–36.0)
MCHC: 33.9 g/dL (ref 30.0–36.0)
MCV: 91.9 fL (ref 80.0–100.0)
MCV: 90.4 fL (ref 80.0–100.0)
Monocytes Absolute: 0.8 10*3/uL (ref 0.1–1.0)
Monocytes Absolute: 0.7 10*3/uL (ref 0.1–1.0)
Monocytes Relative: 6 %
Monocytes Relative: 6 %
Neutro Abs: 12.5 10*3/uL — ABNORMAL HIGH (ref 1.7–7.7)
Neutro Abs: 11.5 10*3/uL — ABNORMAL HIGH (ref 1.7–7.7)
Neutrophils Relative %: 89 %
Neutrophils Relative %: 90 %
Platelets: 94 10*3/uL — ABNORMAL LOW (ref 150–400)
Platelets: 88 10*3/uL — ABNORMAL LOW (ref 150–400)
RBC: 2.95 MIL/uL — ABNORMAL LOW (ref 4.22–5.81)
RBC: 3.43 MIL/uL — ABNORMAL LOW (ref 4.22–5.81)
RDW: 24.8 % — ABNORMAL HIGH (ref 11.5–15.5)
RDW: 25 % — ABNORMAL HIGH (ref 11.5–15.5)
WBC: 13.9 10*3/uL — ABNORMAL HIGH (ref 4.0–10.5)
WBC: 12.8 10*3/uL — ABNORMAL HIGH (ref 4.0–10.5)
nRBC: 2.5 % — ABNORMAL HIGH (ref 0.0–0.2)
nRBC: 2.2 % — ABNORMAL HIGH (ref 0.0–0.2)

## 2022-02-27 LAB — COMPREHENSIVE METABOLIC PANEL
ALT: 48 U/L — ABNORMAL HIGH (ref 0–44)
ALT: 49 U/L — ABNORMAL HIGH (ref 0–44)
AST: 210 U/L — ABNORMAL HIGH (ref 15–41)
AST: 240 U/L — ABNORMAL HIGH (ref 15–41)
Albumin: 1.7 g/dL — ABNORMAL LOW (ref 3.5–5.0)
Albumin: 2 g/dL — ABNORMAL LOW (ref 3.5–5.0)
Alkaline Phosphatase: 2006 U/L — ABNORMAL HIGH (ref 38–126)
Alkaline Phosphatase: 2125 U/L — ABNORMAL HIGH (ref 38–126)
Anion gap: 18 — ABNORMAL HIGH (ref 5–15)
Anion gap: 13 (ref 5–15)
BUN: 66 mg/dL — ABNORMAL HIGH (ref 8–23)
BUN: 26 mg/dL — ABNORMAL HIGH (ref 8–23)
CO2: 20 mmol/L — ABNORMAL LOW (ref 22–32)
CO2: 27 mmol/L (ref 22–32)
Calcium: 6.8 mg/dL — ABNORMAL LOW (ref 8.9–10.3)
Calcium: 7.8 mg/dL — ABNORMAL LOW (ref 8.9–10.3)
Chloride: 97 mmol/L — ABNORMAL LOW (ref 98–111)
Chloride: 93 mmol/L — ABNORMAL LOW (ref 98–111)
Creatinine, Ser: 4.64 mg/dL — ABNORMAL HIGH (ref 0.61–1.24)
Creatinine, Ser: 2.26 mg/dL — ABNORMAL HIGH (ref 0.61–1.24)
GFR, Estimated: 13 mL/min — ABNORMAL LOW (ref >60)
GFR, Estimated: 30 mL/min — ABNORMAL LOW (ref >60)
Glucose, Bld: 133 mg/dL — ABNORMAL HIGH (ref 70–99)
Glucose, Bld: 201 mg/dL — ABNORMAL HIGH (ref 70–99)
Potassium: 4.2 mmol/L (ref 3.5–5.1)
Potassium: 3.9 mmol/L (ref 3.5–5.1)
Sodium: 135 mmol/L (ref 135–145)
Sodium: 133 mmol/L — ABNORMAL LOW (ref 135–145)
Total Bilirubin: 20.7 mg/dL (ref 0.3–1.2)
Total Bilirubin: 22.2 mg/dL (ref 0.3–1.2)
Total Protein: 4.8 g/dL — ABNORMAL LOW (ref 6.5–8.1)
Total Protein: 5.6 g/dL — ABNORMAL LOW (ref 6.5–8.1)

## 2022-02-27 LAB — GLUCOSE, CAPILLARY
Glucose-Capillary: 140 mg/dL — ABNORMAL HIGH (ref 70–99)
Glucose-Capillary: 191 mg/dL — ABNORMAL HIGH (ref 70–99)
Glucose-Capillary: 125 mg/dL — ABNORMAL HIGH (ref 70–99)
Glucose-Capillary: 171 mg/dL — ABNORMAL HIGH (ref 70–99)

## 2022-02-27 LAB — PROTIME-INR
INR: 1.2 (ref 0.8–1.2)
Prothrombin Time: 15.5 seconds — ABNORMAL HIGH (ref 11.4–15.2)

## 2022-02-27 LAB — BILIRUBIN, DIRECT: Bilirubin, Direct: 15.3 mg/dL — ABNORMAL HIGH (ref 0.0–0.2)

## 2022-02-27 LAB — PATHOLOGIST SMEAR REVIEW

## 2022-02-27 MED ORDER — STERILE WATER FOR INJECTION IJ SOLN
INTRAMUSCULAR | Status: AC
  Filled 2022-02-27: qty 10

## 2022-02-27 MED ORDER — DARBEPOETIN ALFA 200 MCG/0.4ML IJ SOSY
200.0000 ug | PREFILLED_SYRINGE | INTRAMUSCULAR | Status: DC
  Filled 2022-02-27: qty 0.4

## 2022-02-27 MED ORDER — HEPARIN SODIUM (PORCINE) 1000 UNIT/ML IJ SOLN
INTRAMUSCULAR | Status: AC
  Filled 2022-02-27: qty 4

## 2022-02-27 NOTE — Progress Notes (Signed)
   02/27/22 0315  Oxygen Therapy  O2 Flow Rate (L/min) 3 L/min  Post Treatment  Dialyzer Clearance Lightly streaked  Duration of HD Treatment -hour(s) 3.5 hour(s)  Liters Processed 84  Fluid Removed 2000  Tolerated HD Treatment Yes  Post-Hemodialysis Comments TX fin. w/o difficulty.

## 2022-02-27 NOTE — Progress Notes (Signed)
[facility] Progress Note  Subjective: patient seen and examined in room. Had brief discussion using interpreter, pt did not have any questions about dialysis.   Vitals:   02/27/22 0737 02/27/22 0958 02/27/22 1000 02/27/22 1043  BP: (!) 120/52 (!) 128/52 (!) 119/59 (!) 138/57  Pulse: 78 75 77 80  Resp: 20 13 (!) 24 17  Temp: (!) 97 F (36.1 C)   97.6 F (36.4 C)  TempSrc: Axillary   Oral  SpO2: 100% 98% 98% 99%  Weight:      Height:        Exam: Gen: NAD, cachectic, jaundiced, pleasant CV: RRR Resp: CTA BL Abd: soft, nt/nd Ext: very trace edema b/l LEs Neuro: awake, alert Dialysis access: RIJ TDC, RUE AVG +b/t   NEW ESRD: set up for OP HD at [facility] on MWF 12:15 chair time - last hep B labs: 8/2, non-immune  Summary: CKD V patient f/b Dr Graylon Gunning, recent AVF placement in R arm (the R AVF placed in Feb 2023 failed and on 02/08/22 had a new RUA AVG placed). Pt also recently dx'd w/ L retroperitoneal paraganglioma came to ED w/ progressive fatigue, weakness and SOB. K+ 6.6 + junctional bradycardia. Pt agreed to start dialysis due to life-threatening cardiac instability. CCM placed temp cath and pt was dialyzed. Amio/ diltiazem were held. IR saw him and placed TDC. Pt improved and was CLIP'd to [facility] on MWF. Admit complicated by pseudomonas UTI from 8/5. FTT felt to be due to ganglioma.   Assessment/ Plan: New ESRD - pt presented w/ junctional bradycardia and progressive CKD V, started HD on 8/3. Accepted to [facility] on MWF schedule. Cont HD on MWF schedule here. Next HD Monday.  A/C chronic hypoxic resp failure, acute on chronic HFpEF - wears home O2 2-3 L [place]. UF as tolerated RUE AVG - placed 7/31. Using [facility] now.  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 On calcitriol and calc acetate. CCa better now.  HTN - BP's low-normal, getting cardizem 180  daily for afib. Pt in NSR now.  PAF - on cardizem for afib Liver failure with coagulopathy - workup negative.  Per primary and GI. +hemosiderosis. Liver biopsy 8/17 Sepsis due to pseudomonas UTI - resolved, sp course of IV abx. Retroperitoneal paraganglioma - to be seen at [facility] as he was deemed too high risk for surgery here.  DNR - Palliative consulting. Pt is now DNR.   Kelly Splinter, MD 02/27/2022, 11:13 AM   Recent Labs  Lab 02/22/22 0151 02/23/22 0622 02/26/22 2245 02/27/22 0713  HGB 9.2*   < > 9.2* 10.5*  ALBUMIN 2.0*   < > 1.7* 2.0*  CALCIUM 6.7*   < > 6.8* 7.8*  PHOS 9.9*  --   --   --   CREATININE 3.68*   < > 4.64* 2.26*  K 4.5   < > 4.2 3.9   < > = values in this interval not displayed.    No results for input(s): "IRON", "TIBC", "FERRITIN" in the last 168 hours. Inpatient medications:  sodium chloride   Intravenous Once   calcitRIOL  0.25 mcg Oral Q M,W,F   calcium acetate  667 mg Oral TID WC   Chlorhexidine Gluconate Cloth  6 each Topical Q0600   darbepoetin (ARANESP) injection - DIALYSIS  200 mcg Intravenous Q Sat-HD  diltiazem  180 mg Oral Daily   feeding supplement (NEPRO CARB STEADY)  237 mL Oral BID   heparin sodium (porcine)       insulin aspart  0-6 Units Subcutaneous TID WC   lidocaine-EPINEPHrine  20 mL Intradermal Once   multivitamin  1 tablet Oral QHS   pantoprazole  40 mg Oral Q0600   sodium chloride flush  3 mL Intravenous Q12H   traZODone  50 mg Oral QHS    sodium chloride Stopped (02/12/22 1243)   fentaNYL, heparin sodium (porcine), HYDROmorphone (DILAUDID) injection, lidocaine (PF), midazolam, ondansetron (ZOFRAN) IV, mouth rinse, prochlorperazine, senna, sodium chloride flush

## 2022-02-27 NOTE — Progress Notes (Signed)
Critical lab value received from charlie/lab, total bilirubin is 22.2, text paged to Dr Laurena Bering

## 2022-02-27 NOTE — Progress Notes (Signed)
PROGRESS NOTE    Adam Reid  QMV:[id number] DOB: 1946/05/10 DOA: 02/28/2022 PCP: [name], MD    Brief Narrative:  76 y.o. male with a history of retroperitoneal paraganglioma, stage V CKD, chronic HFpEF, PAF who presented to the ED 8/2 with weakness, leg swelling. He was found to have junctional bradycardia with hyperkalemia, acidosis, uremia, volume overload, and elevated LFTs. Imaging revealed loculated right pleural effusion. Temporary HD catheter was inserted and HD initiated urgently on 8/3. The hospitalization has been complicated by respiratory failure, liver failure and thrombocytopenia, epistaxis, coffee-ground emesis, and anemia requiring transfusions.  -Now started on hemodialysis Monday Wednesday Friday, permacath in place.  Right upper extremity AV graft was placed 7/31. -Persistently elevated bilirubin.  Liver punch biopsy and ascitic fluid analysis 8/17.  Results are pending.  Remains in very poor clinical status.  Frail debilitated and malnourished and significantly elevated bilirubin.  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 Tunnel catheter 8/7 Right upper extremity AV graft 7/31. Outpatient dialysis Monday Wednesday Friday.  Followed by nephrology. Volume status improved with dialysis.  Acute on chronic hypoxemic respiratory failure: On 2 to 3 L of oxygen at home for several months.  Respiratory status remains stable.  Acute liver failure with coagulopathy: Transaminases are improving, bilirubin is worsening. CT scan without biliary obstruction, Dopplers negative, serological work-up including ANA, smooth muscle IgG, CMV, EBV, hepatitis panel is negative. Initially suspected of congestive hepatopathy, however with persistent elevated LFTs suspecting intrinsic liver disease. Underwent core liver biopsy 8/17.  Results pending.   Paracentesis with bloody fluid, hemorrhagic fluid.  Negative for malignancy. No evidence of metastatic  disease, biopsy will be helpful. GI following.  Acute blood loss anemia on chronic anemia of iron deficiency and CKD: Received 3 units of PRBC, ESA with hemodialysis.  Hemoglobin is stable now.  Coffee-ground emesis, history of GERD and Barrett's esophagus: On PPI.  Conservative management.  Paroxysmal A-fib with junctional bradycardia on arrival: Resolved.  On diltiazem for rate control.  Very frail debilitated.  Not an anticoagulation candidate.  Sepsis due to Pseudomonas UTI: Treated with cefepime.  Clinically improved.  Type 2 diabetes, well controlled with hyperglycemia: On SSI.  Continue.  Nutrition Status: Nutrition Problem: Severe Malnutrition Etiology: chronic illness (CKD now ESRD) Signs/Symptoms: severe muscle depletion, severe fat depletion Interventions: Nepro shake, MVI, Education, Liberalize Diet   Goal of care: Remains very frail and debilitated.  Multiple recent hospitalizations.  Now on hemodialysis. Severe malnutrition and failure to thrive. Newly developed progressive liver failure. Not sure he is a rehab candidate.  Continue to work with PT OT. He may be best served with home hospice if family agrees and stops dialysis.   DVT prophylaxis: SCDs Start: 02/11/22 0027   Code Status: DNR/DNI Family Communication: None today. Disposition Plan: Status is: Inpatient Remains inpatient appropriate because: Inpatient procedures, worsening bilirubin     Consultants:  Nephrology Gastroenterology Palliative  Procedures:  Multiple procedures as above  Antimicrobials:  Completed antibiotics   Subjective: Patient seen and examined.  Alert awake and interactive.  He says he is fine for every questions.  No other overnight events.  Bilirubin more than 20.  Objective: Vitals:   02/27/22 0332 02/27/22 0737 02/27/22 0958 02/27/22 1043  BP: (!) 132/58 (!) 120/52 (!) 128/52 (!) 138/57  Pulse: 74 78 75 80  Resp: '16 20 13 17  '$ Temp: (!) 96.7 F (35.9 C) (!) 97 F  (36.1 C)  97.6 F (36.4 C)  TempSrc:  Axillary  Oral  SpO2: 100% 100% 98% 99%  Weight: 46.7 kg     Height:        Intake/Output Summary (Last 24 hours) at 02/27/2022 1048 Last data filed at 02/27/2022 0739 Gross per 24 hour  Intake 600 ml  Output 2050 ml  Net -1450 ml    Filed Weights   02/26/22 0502 02/26/22 2230 02/27/22 0332  Weight: 48.8 kg 48.8 kg 46.7 kg    Examination:  General exam: Very debilitated and cachectic.  Icteric.  Alert awake and oriented.   Respiratory system: Clear to auscultation. Respiratory effort normal.  On 2 L oxygen. Cardiovascular system: S1 & S2 heard, RRR.  1 bilateral pedal edema. Gastrointestinal system: Abdomen is nondistended, soft and nontender. No organomegaly or masses felt. Normal bowel sounds heard. Foley catheter with clear urine. Central nervous system: Alert and oriented. No focal neurological deficits.  Generalized weakness. Permacath right IJ. AV fistula right arm looks clean and dry.  Sutures intact.    Data Reviewed: I have personally reviewed following labs and imaging studies  CBC: Recent Labs  Lab 02/22/22 0151 02/23/22 0622 02/24/22 0048 02/26/22 2245 02/27/22 0713  WBC 17.7* 17.4* 15.6* 13.9* 12.8*  NEUTROABS  --   --   --  12.5* 11.5*  HGB 9.2* 9.8* 10.4* 9.2* 10.5*  HCT 27.1* 29.2* 30.3* 27.1* 31.0*  MCV 91.2 92.7 91.0 91.9 90.4  PLT 120* 117* 138* 94* 88*    Basic Metabolic Panel: Recent Labs  Lab 02/22/22 0151 02/23/22 0622 02/24/22 0048 02/25/22 0039 02/26/22 0045 02/26/22 2245 02/27/22 0713  NA 131*   < > 131* 134* 135 135 133*  K 4.5   < > 4.5 3.5 4.0 4.2 3.9  CL 92*   < > 92* 96* 97* 97* 93*  CO2 24   < > '22 25 22 '$ 20* 27  GLUCOSE 113*   < > 131* 209* 128* 133* 201*  BUN 52*   < > 52* 30* 51* 66* 26*  CREATININE 3.68*   < > 4.03* 2.63* 3.84* 4.64* 2.26*  CALCIUM 6.7*   < > 6.3* 7.3* 7.1* 6.8* 7.8*  MG  --   --  2.0  --   --   --   --   PHOS 9.9*  --   --   --   --   --   --    < > =  values in this interval not displayed.    GFR: Estimated Creatinine Clearance: 18.9 mL/min (A) (by C-G formula based on SCr of 2.26 mg/dL (H)). Liver Function Tests: Recent Labs  Lab 02/24/22 0048 02/25/22 0039 02/26/22 0045 02/26/22 2245 02/27/22 0713  AST 315* 269* 245* 210* 240*  ALT 125* 122* 94* 48* 49*  ALKPHOS 2,297* 2,226* 2,178* 2,006* 2,125*  BILITOT 17.2* 18.5* 20.2* 20.7* 22.2*  PROT 5.1* 5.3* 5.2* 4.8* 5.6*  ALBUMIN 1.9* 1.8* 1.9* 1.7* 2.0*    Recent Labs  Lab 02/20/22 1620  LIPASE 49    No results for input(s): "AMMONIA" in the last 168 hours. Coagulation Profile: Recent Labs  Lab 02/24/22 0048 02/27/22 0713  INR 1.2 1.2    Cardiac Enzymes: No results for input(s): "CKTOTAL", "CKMB", "CKMBINDEX", "TROPONINI" in the last 168 hours. BNP (last 3 results) Recent Labs    12/10/21 1339  PROBNP 12,645*    HbA1C: No results for input(s): "HGBA1C" in the last 72 hours. CBG: Recent Labs  Lab 02/25/22 1839 02/25/22 2151 02/26/22 0603 02/26/22  1131 02/27/22 0603  GLUCAP 187* 125* 106* 96 140*    Lipid Profile: No results for input(s): "CHOL", "HDL", "LDLCALC", "TRIG", "CHOLHDL", "LDLDIRECT" in the last 72 hours. Thyroid Function Tests: No results for input(s): "TSH", "T4TOTAL", "FREET4", "T3FREE", "THYROIDAB" in the last 72 hours. Anemia Panel: No results for input(s): "VITAMINB12", "FOLATE", "FERRITIN", "TIBC", "IRON", "RETICCTPCT" in the last 72 hours. Sepsis Labs: No results for input(s): "PROCALCITON", "LATICACIDVEN" in the last 168 hours.  Recent Results (from the past 240 hour(s))  Body fluid culture w Gram Stain     Status: None (Preliminary result)   Collection Time: 02/25/22 11:07 AM   Specimen: Peritoneal Washings; Pleural Fluid  Result Value Ref Range Status   Specimen Description PERITONEAL  Final   Special Requests NONE  Final   Gram Stain   Final    WBC PRESENT, PREDOMINANTLY MONONUCLEAR NO ORGANISMS SEEN    Culture   Final     NO GROWTH 2 DAYS Performed at [facility], [address], [place], [place] [address]    Report Status PENDING  Incomplete         Radiology Studies: US BIOPSY (LIVER)  Result Date: 02/25/2022 INDICATION: Elevated liver enzymes EXAM: Ultrasound-guided random liver biopsy MEDICATIONS: None. ANESTHESIA/SEDATION: Moderate (conscious) sedation was employed during this procedure. A total of Versed 1 mg and Fentanyl 25 mcg was administered intravenously by the radiology nurse. Total intra-service moderate Sedation Time: 17 minutes. The patient's level of consciousness and vital signs were monitored continuously by radiology nursing throughout the procedure under my direct supervision. COMPLICATIONS: None immediate. PROCEDURE: Informed written consent was obtained from the patient after a thorough discussion of the procedural risks, benefits and alternatives. All questions were addressed. Maximal Sterile Barrier Technique was utilized including caps, mask, sterile gowns, sterile gloves, sterile drape, hand hygiene and skin antiseptic. A timeout was performed prior to the initiation of the procedure. Patient position supine on the ultrasound table. Epigastric skin prepped and draped in usual sterile fashion. Following local lidocaine administration, 17 gauge introducer needle was advanced into the left hepatic lobe, and three 18 gauge cores were obtained utilizing continuous ultrasound guidance. Gel-Foam slurry was not utilized given patient's sensitivity to pork products. Samples were sent to pathology in formalin. Needle removed and hemostasis achieved with 10 minutes of manual compression. Post procedure ultrasound images showed no evidence of significant hemorrhage. IMPRESSION: Ultrasound-guided random liver biopsy. Electronically Signed   By: Miachel Roux M.D.   On: 02/25/2022 11:48   US Paracentesis  Result Date: 02/25/2022 INDICATION: 76 year old gentleman with abdominal ascites presents  to IR for random liver biopsy. Paracentesis performed in order to decrease risk of hemorrhage from biopsy. EXAM: ULTRASOUND GUIDED  PARACENTESIS MEDICATIONS: None. COMPLICATIONS: None immediate. PROCEDURE: Informed written consent was obtained from the patient after a discussion of the risks, benefits and alternatives to treatment. A timeout was performed prior to the initiation of the procedure. Initial ultrasound scanning demonstrates a small amount of ascites within the right lower abdominal quadrant. The right lower abdomen was prepped and draped in the usual sterile fashion. 1% lidocaine was used for local anesthesia. Following this, a 6 Fr Safe-T-Centesis catheter was introduced. An ultrasound image was saved for documentation purposes. The paracentesis was performed. The catheter was removed and a dressing was applied. The patient tolerated the procedure well without immediate post procedural complication. FINDINGS: A total of approximately 150 mL of blood tinged fluid was removed. Samples were sent to the laboratory as requested by the  clinical team. IMPRESSION: Successful ultrasound-guided paracentesis yielding 150 mL of peritoneal fluid. Electronically Signed   By: Miachel Roux M.D.   On: 02/25/2022 11:46        Scheduled Meds:  sodium chloride   Intravenous Once   calcitRIOL  0.25 mcg Oral Q M,W,F   calcium acetate  667 mg Oral TID WC   Chlorhexidine Gluconate Cloth  6 each Topical Q0600   darbepoetin (ARANESP) injection - DIALYSIS  200 mcg Intravenous Q Sat-HD   diltiazem  180 mg Oral Daily   feeding supplement (NEPRO CARB STEADY)  237 mL Oral BID   heparin sodium (porcine)       insulin aspart  0-6 Units Subcutaneous TID WC   lidocaine-EPINEPHrine  20 mL Intradermal Once   multivitamin  1 tablet Oral QHS   pantoprazole  40 mg Oral Q0600   sodium chloride flush  3 mL Intravenous Q12H   traZODone  50 mg Oral QHS   Continuous Infusions:  sodium chloride Stopped (02/12/22 1243)      LOS: 16 days    Time spent: 35 minutes    Barb Merino, MD [facility] Pager [phone]

## 2022-02-28 DIAGNOSIS — K7200 Acute and subacute hepatic failure without coma: Secondary | ICD-10-CM | POA: Diagnosis not present

## 2022-02-28 DIAGNOSIS — Z992 Dependence on renal dialysis: Secondary | ICD-10-CM | POA: Diagnosis not present

## 2022-02-28 DIAGNOSIS — N186 End stage renal disease: Secondary | ICD-10-CM | POA: Diagnosis not present

## 2022-02-28 DIAGNOSIS — E43 Unspecified severe protein-calorie malnutrition: Secondary | ICD-10-CM | POA: Diagnosis not present

## 2022-02-28 DIAGNOSIS — D447 Neoplasm of uncertain behavior of aortic body and other paraganglia: Secondary | ICD-10-CM | POA: Diagnosis not present

## 2022-02-28 LAB — BODY FLUID CULTURE W GRAM STAIN: Culture: NO GROWTH

## 2022-02-28 LAB — GLUCOSE, CAPILLARY
Glucose-Capillary: 104 mg/dL — ABNORMAL HIGH (ref 70–99)
Glucose-Capillary: 152 mg/dL — ABNORMAL HIGH (ref 70–99)
Glucose-Capillary: 137 mg/dL — ABNORMAL HIGH (ref 70–99)
Glucose-Capillary: 156 mg/dL — ABNORMAL HIGH (ref 70–99)

## 2022-02-28 MED ORDER — CHLORHEXIDINE GLUCONATE CLOTH 2 % EX PADS: 6.0000 | MEDICATED_PAD | Freq: Every day | CUTANEOUS | Status: DC

## 2022-02-28 NOTE — Progress Notes (Signed)
Patient ID: Adam Reid, male   DOB: 07-01-1946, 76 y.o.   MRN: [id number]    Progress Note from the Palliative Medicine Team at [facility]   Patient Name: Adam Reid        Date: 02/28/2022 DOB: 18-Mar-1946  Age: 76 y.o. MRN#: [id number] Attending Physician: Barb Merino, MD Primary Care Physician: [name], MD Admit Date: 02/24/2022   Medical records reviewed   76 y.o. male with a history of retroperitoneal paraganglioma, stage V CKD, chronic HFpEF, PAF who presented to the ED 8/2 with weakness, leg swelling. He was found to have junctional bradycardia with hyperkalemia, acidosis, uremia, volume overload, and elevated LFTs. Imaging revealed loculated right pleural effusion. Temporary HD catheter was inserted and HD initiated urgently on 8/3. The hospitalization has been complicated by respiratory failure, liver failure and thrombocytopenia, epistaxis, coffee-ground emesis, and anemia requiring transfusions.    Palliative care has been asked to get involved to have ongoing goals of care conversations in the setting of patients declining health state. PMT had seen Jujhar extensively from 6/4-6/17.  Patient and family face treatment option decisions, advanced directive decisions and anticipatory care needs.   This NP assessed patient at the bedside as a follow up for palliative medicine needs and emotional support.  Patient is resting comfortably,  wife at bedside.    Created space and opportunity for wife to explore thoughts and feelings regarding current medical situation.  She gives a brief synopsis of the past month as it relates to her husband's medical situation and care at home.    I shared generalized consensus and concern for patient's very poor clinical status and likely associated poor prognosis.  Wife verbalizes an understanding of the seriousness of his current medical situation and the likely associated poor prognosis however at this time she continues with  all offered and available medical interventions to prolong life, knowing that this is her husband's wishes.  Education offered on self-care, specific to  persons acting as caregivers for family members.   Education offered today regarding  the importance of continued conversation with her family (pacifically her son Mike Craze who lives in [place] and is her main support person )and their  medical providers regarding overall plan of care and treatment options,  ensuring decisions are within the context of the patients values and GOCs.  Questions and concerns addressed   PMT will continue to support holistically  Wadie Lessen NP  Palliative Medicine Team Team Phone # [phone] Pager [phone]

## 2022-02-28 NOTE — Progress Notes (Signed)
[facility] Progress Note  Subjective: patient seen and examined in room. No c/o's.   Vitals:   02/27/22 2300 02/28/22 0400 02/28/22 0500 02/28/22 0937  BP: (!) 115/47 (!) 117/54  121/72  Pulse: 72 73    Resp: 13 19    Temp:  97.9 F (36.6 C)    TempSrc:  Oral    SpO2: 99% 100%    Weight:   47.1 kg   Height:        Exam: Gen: NAD, cachectic, jaundiced, pleasant CV: RRR Resp: CTA BL Abd: soft, nt/nd Ext: very trace edema b/l LEs Neuro: awake, alert Dialysis access: RIJ TDC, RUE AVG +b/t   NEW ESRD: set up for OP HD at [facility] on MWF 12:15 chair time - last hep B labs: 8/2, non-immune  Summary: CKD V patient f/b Dr Graylon Gunning, recent AVF placement in R arm (the R AVF placed in Feb 2023 failed and on 02/08/22 had a new RUA AVG placed). Pt also recently dx'd w/ L retroperitoneal paraganglioma came to ED w/ progressive fatigue, weakness and SOB. K+ 6.6 + junctional bradycardia. Pt agreed to start dialysis due to life-threatening cardiac instability. CCM placed temp cath and pt was dialyzed. Amio/ diltiazem were held. IR saw him and placed TDC. Pt improved and was CLIP'd to [facility] on MWF. Admit complicated by pseudomonas UTI from 8/5. FTT felt to be due to ganglioma.   Assessment/ Plan: New ESRD - pt presented w/ junctional bradycardia and progressive CKD V, started HD on 8/3. Accepted to [facility] on MWF schedule. Cont HD on MWF schedule here. HD tomorrow.  A/C chronic hypoxic resp failure, acute on chronic HFpEF - wears home O2 2-3 L [place]. UF as tolerated RUE AVG - placed 7/31. Using [facility] now.  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 On calcitriol and calc acetate. CCa better now.  HTN/ vol - BP's low-normal, getting cardizem 180 daily for afib. Pt in NSR now. Euvolemic on exam. Wt's are down and min UF w/ next HD 1-1.5 L .  PAF - on cardizem for  afib Liver failure with coagulopathy - workup negative.  Per primary and GI. +hemosiderosis. Liver biopsy 8/17 Sepsis due to pseudomonas UTI - resolved, sp course of IV abx. Retroperitoneal paraganglioma - to be seen at [facility] as he was deemed too high risk for surgery here.  DNR - Palliative consulting. Pt is now DNR.   Kelly Splinter, MD 02/28/2022, 12:11 PM   Recent Labs  Lab 02/22/22 0151 02/23/22 0622 02/26/22 2245 02/27/22 0713  HGB 9.2*   < > 9.2* 10.5*  ALBUMIN 2.0*   < > 1.7* 2.0*  CALCIUM 6.7*   < > 6.8* 7.8*  PHOS 9.9*  --   --   --   CREATININE 3.68*   < > 4.64* 2.26*  K 4.5   < > 4.2 3.9   < > = values in this interval not displayed.    No results for input(s): "IRON", "TIBC", "FERRITIN" in the last 168 hours. Inpatient medications:  sodium chloride   Intravenous Once   calcitRIOL  0.25 mcg Oral Q M,W,F   calcium acetate  667 mg Oral TID WC   Chlorhexidine Gluconate Cloth  6 each Topical Q0600   [START ON 03/01/2022] darbepoetin (ARANESP) injection - DIALYSIS  200 mcg Intravenous Q  Mon-HD   diltiazem  180 mg Oral Daily   feeding supplement (NEPRO CARB STEADY)  237 mL Oral BID   insulin aspart  0-6 Units Subcutaneous TID WC   lidocaine-EPINEPHrine  20 mL Intradermal Once   multivitamin  1 tablet Oral QHS   pantoprazole  40 mg Oral Q0600   sodium chloride flush  3 mL Intravenous Q12H   traZODone  50 mg Oral QHS    sodium chloride Stopped (02/12/22 1243)   fentaNYL, HYDROmorphone (DILAUDID) injection, lidocaine (PF), midazolam, ondansetron (ZOFRAN) IV, mouth rinse, prochlorperazine, senna, sodium chloride flush

## 2022-02-28 NOTE — Plan of Care (Signed)
  Problem: Education: Goal: Knowledge of General Education information will improve Description: Including pain rating scale, medication(s)/side effects and non-pharmacologic comfort measures Outcome: Progressing   Problem: Health Behavior/Discharge Planning: Goal: Ability to manage health-related needs will improve Outcome: Progressing   Problem: Clinical Measurements: Goal: Ability to maintain clinical measurements within normal limits will improve Outcome: Progressing Goal: Diagnostic test results will improve Outcome: Progressing   Problem: Activity: Goal: Risk for activity intolerance will decrease Outcome: Progressing   Problem: Nutrition: Goal: Adequate nutrition will be maintained Outcome: Progressing   Problem: Skin Integrity: Goal: Risk for impaired skin integrity will decrease Outcome: Progressing

## 2022-02-28 NOTE — Progress Notes (Signed)
PROGRESS NOTE    Adam Reid  WUJ:[id number] DOB: 27-Aug-1945 DOA: 02/12/2022 PCP: [name], MD    Brief Narrative:  76 y.o. male with a history of retroperitoneal paraganglioma, stage V CKD, chronic HFpEF, PAF who presented to the ED 8/2 with weakness, leg swelling. He was found to have junctional bradycardia with hyperkalemia, acidosis, uremia, volume overload, and elevated LFTs. Imaging revealed loculated right pleural effusion. Temporary HD catheter was inserted and HD initiated urgently on 8/3. The hospitalization has been complicated by respiratory failure, liver failure and thrombocytopenia, epistaxis, coffee-ground emesis, and anemia requiring transfusions.  -Now started on hemodialysis Monday Wednesday Friday, permacath in place.  Right upper extremity AV graft was placed 7/31. -Persistently elevated bilirubin.  Liver punch biopsy and ascitic fluid analysis 8/17.  Biopsies pending.  Remains in very poor clinical status.  Frail debilitated and malnourished and significantly elevated bilirubin.  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  Currently on Monday Wednesday Friday schedule. Tunnel catheter 8/7.  AV graft 7/31.  Acute on chronic hypoxemic respiratory failure: On 2 to 3 L of oxygen at home for several months.  Respiratory status remains stable.  Euvolemic.  Acute liver failure with coagulopathy: Transaminases are improving, bilirubin is worsening. CT scan without biliary obstruction, Dopplers negative, serological work-up including ANA, smooth muscle IgG, CMV, EBV, hepatitis panel is negative. Initially suspected of congestive hepatopathy, however with persistent elevated LFTs suspecting intrinsic liver disease. Underwent core liver biopsy 8/17.  Results pending.   Paracentesis with bloody fluid, hemorrhagic fluid.  Negative for malignancy. No evidence of metastatic disease, pathology pending. GI following.  Acute blood loss anemia on  chronic anemia of iron deficiency and CKD: Received 3 units of PRBC, ESA with hemodialysis.  Hemoglobin is stable now.  Coffee-ground emesis, history of GERD and Barrett's esophagus: On PPI.  Conservative management.  Paroxysmal A-fib with junctional bradycardia on arrival: Resolved.  On diltiazem for rate control.  Very frail debilitated.  Not an anticoagulation candidate.  Sepsis due to Pseudomonas UTI: Treated with cefepime.  Clinically improved.  Type 2 diabetes, well controlled with hyperglycemia: On SSI.  Continue.  Nutrition Status: Nutrition Problem: Severe Malnutrition Etiology: chronic illness (CKD now ESRD) Signs/Symptoms: severe muscle depletion, severe fat depletion Interventions: Nepro shake, MVI, Education, Liberalize Diet   Goal of care: Remains very frail and debilitated.  Multiple recent hospitalizations.  Now on hemodialysis. Severe malnutrition and failure to thrive. Newly developed progressive liver failure. Continue to work with PT OT. He may be best served with home hospice if family agrees and stops dialysis.   DVT prophylaxis: SCDs Start: 02/11/22 0027   Code Status: DNR/DNI Family Communication: None today. Disposition Plan: Status is: Inpatient Remains inpatient appropriate because: Inpatient procedures, worsening bilirubin     Consultants:  Nephrology Gastroenterology Palliative  Procedures:  Multiple procedures as above  Antimicrobials:  Completed antibiotics   Subjective: Seen and examined.  Pleasant.  Denies any complaints.  Objective: Vitals:   02/27/22 2300 02/28/22 0400 02/28/22 0500 02/28/22 0937  BP: (!) 115/47 (!) 117/54  121/72  Pulse: 72 73    Resp: 13 19    Temp:  97.9 F (36.6 C)    TempSrc:  Oral    SpO2: 99% 100%    Weight:   47.1 kg   Height:        Intake/Output Summary (Last 24 hours) at 02/28/2022 1019 Last data filed at 02/28/2022 0412 Gross per 24 hour  Intake 180  ml  Output 1 ml  Net 179 ml   Filed  Weights   02/26/22 2230 02/27/22 0332 02/28/22 0500  Weight: 48.8 kg 46.7 kg 47.1 kg    Examination:  General exam: Frail and debilitated.  Comfortable on 2 L oxygen.  Interacting well.  Poor appetite. Respiratory system: Looks comfortable on 2 L oxygen.   Cardiovascular system: S1 & S2 heard, RRR.  1 bilateral pedal edema. Gastrointestinal system: Abdomen is nondistended, soft and nontender. No organomegaly or masses felt. Normal bowel sounds heard. Foley catheter with clear urine. Central nervous system: Alert and oriented. No focal neurological deficits.  Generalized weakness. Permacath right IJ. AV fistula right arm looks clean and dry.  Sutures intact.    Data Reviewed: I have personally reviewed following labs and imaging studies  CBC: Recent Labs  Lab 02/22/22 0151 02/23/22 0622 02/24/22 0048 02/26/22 2245 02/27/22 0713  WBC 17.7* 17.4* 15.6* 13.9* 12.8*  NEUTROABS  --   --   --  12.5* 11.5*  HGB 9.2* 9.8* 10.4* 9.2* 10.5*  HCT 27.1* 29.2* 30.3* 27.1* 31.0*  MCV 91.2 92.7 91.0 91.9 90.4  PLT 120* 117* 138* 94* 88*   Basic Metabolic Panel: Recent Labs  Lab 02/22/22 0151 02/23/22 0622 02/24/22 0048 02/25/22 0039 02/26/22 0045 02/26/22 2245 02/27/22 0713  NA 131*   < > 131* 134* 135 135 133*  K 4.5   < > 4.5 3.5 4.0 4.2 3.9  CL 92*   < > 92* 96* 97* 97* 93*  CO2 24   < > '22 25 22 '$ 20* 27  GLUCOSE 113*   < > 131* 209* 128* 133* 201*  BUN 52*   < > 52* 30* 51* 66* 26*  CREATININE 3.68*   < > 4.03* 2.63* 3.84* 4.64* 2.26*  CALCIUM 6.7*   < > 6.3* 7.3* 7.1* 6.8* 7.8*  MG  --   --  2.0  --   --   --   --   PHOS 9.9*  --   --   --   --   --   --    < > = values in this interval not displayed.   GFR: Estimated Creatinine Clearance: 19.1 mL/min (A) (by C-G formula based on SCr of 2.26 mg/dL (H)). Liver Function Tests: Recent Labs  Lab 02/24/22 0048 02/25/22 0039 02/26/22 0045 02/26/22 2245 02/27/22 0713  AST 315* 269* 245* 210* 240*  ALT 125* 122* 94*  48* 49*  ALKPHOS 2,297* 2,226* 2,178* 2,006* 2,125*  BILITOT 17.2* 18.5* 20.2* 20.7* 22.2*  PROT 5.1* 5.3* 5.2* 4.8* 5.6*  ALBUMIN 1.9* 1.8* 1.9* 1.7* 2.0*   No results for input(s): "LIPASE", "AMYLASE" in the last 168 hours.  No results for input(s): "AMMONIA" in the last 168 hours. Coagulation Profile: Recent Labs  Lab 02/24/22 0048 02/27/22 0713  INR 1.2 1.2   Cardiac Enzymes: No results for input(s): "CKTOTAL", "CKMB", "CKMBINDEX", "TROPONINI" in the last 168 hours. BNP (last 3 results) Recent Labs    12/10/21 1339  PROBNP 12,645*   HbA1C: No results for input(s): "HGBA1C" in the last 72 hours. CBG: Recent Labs  Lab 02/27/22 0603 02/27/22 1048 02/27/22 1624 02/27/22 2105 02/28/22 0652  GLUCAP 140* 191* 125* 171* 104*   Lipid Profile: No results for input(s): "CHOL", "HDL", "LDLCALC", "TRIG", "CHOLHDL", "LDLDIRECT" in the last 72 hours. Thyroid Function Tests: No results for input(s): "TSH", "T4TOTAL", "FREET4", "T3FREE", "THYROIDAB" in the last 72 hours. Anemia Panel: No results for input(s): "VITAMINB12", "FOLATE", "  FERRITIN", "TIBC", "IRON", "RETICCTPCT" in the last 72 hours. Sepsis Labs: No results for input(s): "PROCALCITON", "LATICACIDVEN" in the last 168 hours.  Recent Results (from the past 240 hour(s))  Body fluid culture w Gram Stain     Status: None (Preliminary result)   Collection Time: 02/25/22 11:07 AM   Specimen: Peritoneal Washings; Pleural Fluid  Result Value Ref Range Status   Specimen Description PERITONEAL  Final   Special Requests NONE  Final   Gram Stain   Final    WBC PRESENT, PREDOMINANTLY MONONUCLEAR NO ORGANISMS SEEN    Culture   Final    NO GROWTH 3 DAYS Performed at [facility], [address], [place], [place] [address]    Report Status PENDING  Incomplete         Radiology Studies: No results found.      Scheduled Meds:  sodium chloride   Intravenous Once   calcitRIOL  0.25 mcg Oral Q M,W,F   calcium  acetate  667 mg Oral TID WC   Chlorhexidine Gluconate Cloth  6 each Topical Q0600   [START ON 03/01/2022] darbepoetin (ARANESP) injection - DIALYSIS  200 mcg Intravenous Q Mon-HD   diltiazem  180 mg Oral Daily   feeding supplement (NEPRO CARB STEADY)  237 mL Oral BID   insulin aspart  0-6 Units Subcutaneous TID WC   lidocaine-EPINEPHrine  20 mL Intradermal Once   multivitamin  1 tablet Oral QHS   pantoprazole  40 mg Oral Q0600   sodium chloride flush  3 mL Intravenous Q12H   traZODone  50 mg Oral QHS   Continuous Infusions:  sodium chloride Stopped (02/12/22 1243)     LOS: 17 days    Time spent: 35 minutes    Barb Merino, MD [facility] Pager [phone]

## 2022-03-01 DIAGNOSIS — N186 End stage renal disease: Secondary | ICD-10-CM | POA: Diagnosis not present

## 2022-03-01 DIAGNOSIS — Z992 Dependence on renal dialysis: Secondary | ICD-10-CM | POA: Diagnosis not present

## 2022-03-01 LAB — COMPREHENSIVE METABOLIC PANEL
ALT: 33 U/L (ref 0–44)
AST: 189 U/L — ABNORMAL HIGH (ref 15–41)
Albumin: 1.8 g/dL — ABNORMAL LOW (ref 3.5–5.0)
Alkaline Phosphatase: 1758 U/L — ABNORMAL HIGH (ref 38–126)
Anion gap: 17 — ABNORMAL HIGH (ref 5–15)
BUN: 60 mg/dL — ABNORMAL HIGH (ref 8–23)
CO2: 20 mmol/L — ABNORMAL LOW (ref 22–32)
Calcium: 7.6 mg/dL — ABNORMAL LOW (ref 8.9–10.3)
Chloride: 96 mmol/L — ABNORMAL LOW (ref 98–111)
Creatinine, Ser: 4.2 mg/dL — ABNORMAL HIGH (ref 0.61–1.24)
GFR, Estimated: 14 mL/min — ABNORMAL LOW (ref >60)
Glucose, Bld: 113 mg/dL — ABNORMAL HIGH (ref 70–99)
Potassium: 5 mmol/L (ref 3.5–5.1)
Sodium: 133 mmol/L — ABNORMAL LOW (ref 135–145)
Total Bilirubin: 23.9 mg/dL (ref 0.3–1.2)
Total Protein: 5.4 g/dL — ABNORMAL LOW (ref 6.5–8.1)

## 2022-03-01 LAB — GLUCOSE, CAPILLARY
Glucose-Capillary: 159 mg/dL — ABNORMAL HIGH (ref 70–99)
Glucose-Capillary: 83 mg/dL (ref 70–99)
Glucose-Capillary: 166 mg/dL — ABNORMAL HIGH (ref 70–99)
Glucose-Capillary: 81 mg/dL (ref 70–99)
Glucose-Capillary: 189 mg/dL — ABNORMAL HIGH (ref 70–99)

## 2022-03-01 LAB — CBC
HCT: 29.1 % — ABNORMAL LOW (ref 39.0–52.0)
Hemoglobin: 9.7 g/dL — ABNORMAL LOW (ref 13.0–17.0)
MCH: 31.2 pg (ref 26.0–34.0)
MCHC: 33.3 g/dL (ref 30.0–36.0)
MCV: 93.6 fL (ref 80.0–100.0)
Platelets: 92 10*3/uL — ABNORMAL LOW (ref 150–400)
RBC: 3.11 MIL/uL — ABNORMAL LOW (ref 4.22–5.81)
RDW: 24.3 % — ABNORMAL HIGH (ref 11.5–15.5)
WBC: 13.6 10*3/uL — ABNORMAL HIGH (ref 4.0–10.5)
nRBC: 0.8 % — ABNORMAL HIGH (ref 0.0–0.2)

## 2022-03-01 LAB — RENAL FUNCTION PANEL
Albumin: 1.7 g/dL — ABNORMAL LOW (ref 3.5–5.0)
Anion gap: 18 — ABNORMAL HIGH (ref 5–15)
BUN: 70 mg/dL — ABNORMAL HIGH (ref 8–23)
CO2: 19 mmol/L — ABNORMAL LOW (ref 22–32)
Calcium: 7.4 mg/dL — ABNORMAL LOW (ref 8.9–10.3)
Chloride: 95 mmol/L — ABNORMAL LOW (ref 98–111)
Creatinine, Ser: 4.54 mg/dL — ABNORMAL HIGH (ref 0.61–1.24)
GFR, Estimated: 13 mL/min — ABNORMAL LOW (ref >60)
Glucose, Bld: 117 mg/dL — ABNORMAL HIGH (ref 70–99)
Phosphorus: 11.8 mg/dL — ABNORMAL HIGH (ref 2.5–4.6)
Potassium: 5 mmol/L (ref 3.5–5.1)
Sodium: 132 mmol/L — ABNORMAL LOW (ref 135–145)

## 2022-03-01 MED ORDER — HEPARIN SODIUM (PORCINE) 1000 UNIT/ML IJ SOLN
INTRAMUSCULAR | Status: AC
  Filled 2022-03-01: qty 4

## 2022-03-01 MED ORDER — DARBEPOETIN ALFA 200 MCG/0.4ML IJ SOSY
200.0000 ug | PREFILLED_SYRINGE | INTRAMUSCULAR | Status: DC
Start: 2022-03-01 — End: 2022-03-05
  Administered 2022-03-01: 200 ug via SUBCUTANEOUS
  Filled 2022-03-01: qty 0.4

## 2022-03-01 NOTE — Progress Notes (Signed)
PROGRESS NOTE    Adam Reid  GMW:[id number] DOB: 1945-11-03 DOA: 02/25/2022 PCP: [name], MD    Brief Narrative:  76 y.o. male with a history of retroperitoneal paraganglioma, stage V CKD, chronic HFpEF, PAF who presented to the ED 8/2 with weakness, leg swelling. He was found to have junctional bradycardia with hyperkalemia, acidosis, uremia, volume overload, and elevated LFTs. Imaging revealed loculated right pleural effusion. Temporary HD catheter was inserted and HD initiated urgently on 8/3. The hospitalization has been complicated by respiratory failure, liver failure and thrombocytopenia, epistaxis, coffee-ground emesis, and anemia requiring transfusions.  -Now started on hemodialysis Monday Wednesday Friday, permacath in place.  Right upper extremity AV graft was placed 7/31. -Persistently elevated bilirubin.  Liver punch biopsy and ascitic fluid analysis 8/17.  Biopsies pending.  Remains in very poor clinical status.  Frail debilitated and malnourished and bilirubin continues to rise.  Biopsy pending.  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  Currently on Monday Wednesday Friday schedule. Tunnel catheter 8/7.  AV graft 7/31.  Acute on chronic hypoxemic respiratory failure: On 2 to 3 L of oxygen at home for several months.  Respiratory status remains stable.  Euvolemic.  Acute liver failure with coagulopathy: Transaminases are improving, bilirubin is worsening. CT scan without biliary obstruction, Dopplers negative, serological work-up including ANA, smooth muscle IgG, CMV, EBV, hepatitis panel is negative. Initially suspected of congestive hepatopathy, however with persistent elevated LFTs suspecting intrinsic liver disease. Underwent core liver biopsy 8/17.  Results pending.   Paracentesis with bloody fluid, hemorrhagic fluid.  Negative for malignancy. No evidence of metastatic disease, pathology pending. GI following.  Acute blood  loss anemia on chronic anemia of iron deficiency and CKD: Received 3 units of PRBC, ESA with hemodialysis.  Hemoglobin is stable now.  Coffee-ground emesis, history of GERD and Barrett's esophagus: On PPI.  Conservative management.  Paroxysmal A-fib with junctional bradycardia on arrival: Resolved.  On diltiazem for rate control.  Very frail debilitated.  Not an anticoagulation candidate.  Sepsis due to Pseudomonas UTI: Treated with cefepime.  Clinically improved.  Type 2 diabetes, well controlled with hyperglycemia: On SSI.  Continue.  Nutrition Status: Nutrition Problem: Severe Malnutrition Etiology: chronic illness (CKD now ESRD) Signs/Symptoms: severe muscle depletion, severe fat depletion Interventions: Nepro shake, MVI, Education, Liberalize Diet   Goal of care: Remains very frail and debilitated.  Multiple recent hospitalizations.  Now on hemodialysis. Severe malnutrition and failure to thrive. Newly developed progressive liver failure. Continue to work with PT OT. He may be best served with home hospice if family agrees and stops dialysis. Explored options to SNF rehab.   DVT prophylaxis: SCDs Start: 02/11/22 0027   Code Status: DNR/DNI Family Communication: None today. Disposition Plan: Status is: Inpatient Remains inpatient appropriate because: Worsening bilirubin levels. CIR versus SNF.     Consultants:  Nephrology Gastroenterology Palliative  Procedures:  Multiple procedures as above  Antimicrobials:  Completed antibiotics   Subjective: Seen and examined. No overnight events.  Pleasant and denies any complaints.  Objective: Vitals:   02/28/22 1223 02/28/22 1923 03/01/22 0418 03/01/22 0723  BP: (!) 114/51 (!) 132/57 (!) 132/53 (!) 132/49  Pulse: 74 76  76  Resp: (!) 29 16  (!) 24  Temp: 97.8 F (36.6 C) 97.6 F (36.4 C) 97.7 F (36.5 C) 98.3 F (36.8 C)  TempSrc: Oral Oral Oral Oral  SpO2: 99% 93%  93%  Weight:   48.6 kg   Height:  Intake/Output Summary (Last 24 hours) at 03/01/2022 1046 Last data filed at 03/01/2022 0700 Gross per 24 hour  Intake 620 ml  Output --  Net 620 ml   Filed Weights   02/27/22 0332 02/28/22 0500 03/01/22 0418  Weight: 46.7 kg 47.1 kg 48.6 kg    Examination:  General exam: Frail and debilitated.  Interacting well.  On 2 liters of oxygen. Icteric.  Respiratory system: Looks comfortable on 2 L oxygen.   Cardiovascular system: S1 & S2 heard, RRR.  2+ bilateral pedal edema, more on dorsum of the foot. Gastrointestinal system: Abdomen is nondistended, soft and nontender. No organomegaly or masses felt. Normal bowel sounds heard. Central nervous system: Alert and oriented. No focal neurological deficits.  Generalized weakness. Permacath right IJ. AV fistula right arm looks clean and dry.  Sutures intact.    Data Reviewed: I have personally reviewed following labs and imaging studies  CBC: Recent Labs  Lab 02/23/22 0622 02/24/22 0048 02/26/22 2245 02/27/22 0713  WBC 17.4* 15.6* 13.9* 12.8*  NEUTROABS  --   --  12.5* 11.5*  HGB 9.8* 10.4* 9.2* 10.5*  HCT 29.2* 30.3* 27.1* 31.0*  MCV 92.7 91.0 91.9 90.4  PLT 117* 138* 94* 88*   Basic Metabolic Panel: Recent Labs  Lab 02/24/22 0048 02/25/22 0039 02/26/22 0045 02/26/22 2245 02/27/22 0713 03/01/22 0121  NA 131* 134* 135 135 133* 133*  K 4.5 3.5 4.0 4.2 3.9 5.0  CL 92* 96* 97* 97* 93* 96*  CO2 '22 25 22 '$ 20* 27 20*  GLUCOSE 131* 209* 128* 133* 201* 113*  BUN 52* 30* 51* 66* 26* 60*  CREATININE 4.03* 2.63* 3.84* 4.64* 2.26* 4.20*  CALCIUM 6.3* 7.3* 7.1* 6.8* 7.8* 7.6*  MG 2.0  --   --   --   --   --    GFR: Estimated Creatinine Clearance: 10.6 mL/min (A) (by C-G formula based on SCr of 4.2 mg/dL (H)). Liver Function Tests: Recent Labs  Lab 02/25/22 0039 02/26/22 0045 02/26/22 2245 02/27/22 0713 03/01/22 0121  AST 269* 245* 210* 240* 189*  ALT 122* 94* 48* 49* 33  ALKPHOS 2,226* 2,178* 2,006* 2,125* 1,758*   BILITOT 18.5* 20.2* 20.7* 22.2* 23.9*  PROT 5.3* 5.2* 4.8* 5.6* 5.4*  ALBUMIN 1.8* 1.9* 1.7* 2.0* 1.8*   No results for input(s): "LIPASE", "AMYLASE" in the last 168 hours.  No results for input(s): "AMMONIA" in the last 168 hours. Coagulation Profile: Recent Labs  Lab 02/24/22 0048 02/27/22 0713  INR 1.2 1.2   Cardiac Enzymes: No results for input(s): "CKTOTAL", "CKMB", "CKMBINDEX", "TROPONINI" in the last 168 hours. BNP (last 3 results) Recent Labs    12/10/21 1339  PROBNP 12,645*   HbA1C: No results for input(s): "HGBA1C" in the last 72 hours. CBG: Recent Labs  Lab 02/28/22 0652 02/28/22 1133 02/28/22 1652 02/28/22 2136 03/01/22 0640  GLUCAP 104* 152* 137* 156* 83   Lipid Profile: No results for input(s): "CHOL", "HDL", "LDLCALC", "TRIG", "CHOLHDL", "LDLDIRECT" in the last 72 hours. Thyroid Function Tests: No results for input(s): "TSH", "T4TOTAL", "FREET4", "T3FREE", "THYROIDAB" in the last 72 hours. Anemia Panel: No results for input(s): "VITAMINB12", "FOLATE", "FERRITIN", "TIBC", "IRON", "RETICCTPCT" in the last 72 hours. Sepsis Labs: No results for input(s): "PROCALCITON", "LATICACIDVEN" in the last 168 hours.  Recent Results (from the past 240 hour(s))  Body fluid culture w Gram Stain     Status: None   Collection Time: 02/25/22 11:07 AM   Specimen: Peritoneal Washings; Pleural Fluid  Result Value  Ref Range Status   Specimen Description PERITONEAL  Final   Special Requests NONE  Final   Gram Stain   Final    WBC PRESENT, PREDOMINANTLY MONONUCLEAR NO ORGANISMS SEEN    Culture   Final    NO GROWTH 3 DAYS Performed at [facility], [address] [place], [place] [address]    Report Status 02/28/2022 FINAL  Final         Radiology Studies: No results found.      Scheduled Meds:  sodium chloride   Intravenous Once   calcitRIOL  0.25 mcg Oral Q M,W,F   calcium acetate  667 mg Oral TID WC   Chlorhexidine Gluconate Cloth  6 each  Topical Q0600   Chlorhexidine Gluconate Cloth  6 each Topical Q0600   darbepoetin (ARANESP) injection - DIALYSIS  200 mcg Intravenous Q Mon-HD   diltiazem  180 mg Oral Daily   feeding supplement (NEPRO CARB STEADY)  237 mL Oral BID   insulin aspart  0-6 Units Subcutaneous TID WC   lidocaine-EPINEPHrine  20 mL Intradermal Once   multivitamin  1 tablet Oral QHS   pantoprazole  40 mg Oral Q0600   sodium chloride flush  3 mL Intravenous Q12H   traZODone  50 mg Oral QHS   Continuous Infusions:  sodium chloride Stopped (02/12/22 1243)     LOS: 18 days    Time spent: 35 minutes    Barb Merino, MD [facility] Pager [phone]

## 2022-03-01 NOTE — Plan of Care (Signed)
  Problem: Clinical Measurements: Goal: Ability to maintain clinical measurements within normal limits will improve Outcome: Progressing Goal: Respiratory complications will improve Outcome: Progressing Goal: Cardiovascular complication will be avoided Outcome: Progressing   Problem: Coping: Goal: Level of anxiety will decrease Outcome: Progressing   Problem: Elimination: Goal: Will not experience complications related to urinary retention Outcome: Progressing   Problem: Pain Managment: Goal: General experience of comfort will improve Outcome: Progressing

## 2022-03-01 NOTE — Progress Notes (Signed)
Hd tx of 3.5hrs completed. 84L total vol processed. No complications. Report given to charita decena, rn. Total uf removed: 1556m Post hd wt: 52.8kgs Post hd v/s: 98.2 122/49(68) 83 10 99%

## 2022-03-01 NOTE — Progress Notes (Signed)
Back from hemodialysis by bed awake and alert. 

## 2022-03-01 NOTE — Progress Notes (Signed)
[facility] Progress Note  Subjective: patient seen in room.   Vitals:   02/28/22 0937 02/28/22 1223 02/28/22 1923 03/01/22 0418  BP: 121/72 (!) 114/51 (!) 132/57 (!) 132/53  Pulse:  74 76   Resp:  (!) 29 16   Temp:  97.8 F (36.6 C) 97.6 F (36.4 C) 97.7 F (36.5 C)  TempSrc:  Oral Oral Oral  SpO2:  99% 93%   Weight:    48.6 kg  Height:        Exam: Gen: NAD, cachectic, jaundiced, pleasant CV: RRR Resp: CTA BL Abd: soft, nt/nd Ext: very trace edema b/l LEs Neuro: awake, alert Dialysis access: RIJ TDC, RUE AVG +b/t   NEW ESRD: set up for OP HD at [facility] on MWF 12:15 chair time - last hep B labs: 8/2, non-immune  Summary: CKD V patient f/b Dr Graylon Gunning, recent AVF placement in R arm (the R AVF placed in Feb 2023 failed and on 02/08/22 had a new RUA AVG placed). Pt also recently dx'd w/ L retroperitoneal paraganglioma came to ED w/ progressive fatigue, weakness and SOB. K+ 6.6 + junctional bradycardia. Pt agreed to start dialysis due to life-threatening cardiac instability. CCM placed temp cath and pt was dialyzed. Amio/ diltiazem were held. IR saw him and placed TDC. Pt improved and was CLIP'd to [facility] on MWF. Admit complicated by pseudomonas UTI from 8/5. FTT felt to be due to ganglioma.   Assessment/ Plan: New ESRD - started HD here 8/3. Accepted to [facility] on MWF schedule. Cont HD MWF here. HD today.  Acute/ chronic hypoxic resp failure - acute phase resolved. Wears home O2 2-3 L [place]. Wt's down and no vol excess.  RUE AVG - placed 7/31. Using [facility] now.  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 Cont calcitriol and calc acetate. CCa improving.  HTN/ vol - BP's low-normal, getting cardizem 180 daily for afib. Pt in NSR now. Euvolemic on exam. UF 1-2 L.  PAF - on cardizem for afib Liver failure with coagulopathy - workup negative.  Per primary and  GI. +hemosiderosis. Liver biopsy 8/17 Sepsis due to pseudomonas UTI - resolved, sp course of IV abx. Retroperitoneal paraganglioma - to be seen at [facility] as he was deemed too high risk for surgery here.  DNR - Palliative consulting, pt DNR now but full medical care otherwise. Will need to do HD in a chair to be able to dc to home or SNF, will order w/ next HD.   Kelly Splinter, MD 03/01/2022, 7:35 AM   Recent Labs  Lab 02/26/22 2245 02/27/22 0713 03/01/22 0121  HGB 9.2* 10.5*  --   ALBUMIN 1.7* 2.0* 1.8*  CALCIUM 6.8* 7.8* 7.6*  CREATININE 4.64* 2.26* 4.20*  K 4.2 3.9 5.0    No results for input(s): "IRON", "TIBC", "FERRITIN" in the last 168 hours. Inpatient medications:  sodium chloride   Intravenous Once   calcitRIOL  0.25 mcg Oral Q M,W,F   calcium acetate  667 mg Oral TID WC   Chlorhexidine Gluconate Cloth  6 each Topical Q0600   Chlorhexidine Gluconate Cloth  6 each Topical Q0600   darbepoetin (ARANESP) injection - DIALYSIS  200 mcg Intravenous Q Mon-HD   diltiazem  180 mg Oral Daily   feeding supplement (NEPRO CARB STEADY)  237 mL Oral BID   insulin  aspart  0-6 Units Subcutaneous TID WC   lidocaine-EPINEPHrine  20 mL Intradermal Once   multivitamin  1 tablet Oral QHS   pantoprazole  40 mg Oral Q0600   sodium chloride flush  3 mL Intravenous Q12H   traZODone  50 mg Oral QHS    sodium chloride Stopped (02/12/22 1243)   fentaNYL, HYDROmorphone (DILAUDID) injection, lidocaine (PF), midazolam, ondansetron (ZOFRAN) IV, mouth rinse, prochlorperazine, senna, sodium chloride flush

## 2022-03-01 NOTE — Plan of Care (Signed)
?  Problem: Education: ?Goal: Knowledge of General Education information will improve ?Description: Including pain rating scale, medication(s)/side effects and non-pharmacologic comfort measures ?Outcome: Progressing ?  ?Problem: Health Behavior/Discharge Planning: ?Goal: Ability to manage health-related needs will improve ?Outcome: Progressing ?  ?Problem: Activity: ?Goal: Risk for activity intolerance will decrease ?Outcome: Progressing ?  ?Problem: Nutrition: ?Goal: Adequate nutrition will be maintained ?Outcome: Progressing ?  ?Problem: Skin Integrity: ?Goal: Risk for impaired skin integrity will decrease ?Outcome: Progressing ?  ?

## 2022-03-01 NOTE — Progress Notes (Signed)
Pt has sutures right arm from AVG insertion.  Per wife they were due to come out today in Dr. Mathis Fare office.  Site unremarkable.  Positive thrill/bruit.

## 2022-03-02 DIAGNOSIS — Z992 Dependence on renal dialysis: Secondary | ICD-10-CM | POA: Diagnosis not present

## 2022-03-02 DIAGNOSIS — R627 Adult failure to thrive: Secondary | ICD-10-CM | POA: Diagnosis not present

## 2022-03-02 DIAGNOSIS — I5032 Chronic diastolic (congestive) heart failure: Secondary | ICD-10-CM | POA: Diagnosis not present

## 2022-03-02 DIAGNOSIS — R531 Weakness: Secondary | ICD-10-CM

## 2022-03-02 DIAGNOSIS — N186 End stage renal disease: Secondary | ICD-10-CM | POA: Diagnosis not present

## 2022-03-02 DIAGNOSIS — K7200 Acute and subacute hepatic failure without coma: Secondary | ICD-10-CM | POA: Diagnosis not present

## 2022-03-02 LAB — GLUCOSE, CAPILLARY
Glucose-Capillary: 115 mg/dL — ABNORMAL HIGH (ref 70–99)
Glucose-Capillary: 174 mg/dL — ABNORMAL HIGH (ref 70–99)
Glucose-Capillary: 157 mg/dL — ABNORMAL HIGH (ref 70–99)
Glucose-Capillary: 126 mg/dL — ABNORMAL HIGH (ref 70–99)
Glucose-Capillary: 140 mg/dL — ABNORMAL HIGH (ref 70–99)

## 2022-03-02 LAB — CBC
HCT: 30.1 % — ABNORMAL LOW (ref 39.0–52.0)
Hemoglobin: 10.3 g/dL — ABNORMAL LOW (ref 13.0–17.0)
MCH: 31.4 pg (ref 26.0–34.0)
MCHC: 34.2 g/dL (ref 30.0–36.0)
MCV: 91.8 fL (ref 80.0–100.0)
Platelets: 91 10*3/uL — ABNORMAL LOW (ref 150–400)
RBC: 3.28 MIL/uL — ABNORMAL LOW (ref 4.22–5.81)
RDW: 24.6 % — ABNORMAL HIGH (ref 11.5–15.5)
WBC: 12.1 10*3/uL — ABNORMAL HIGH (ref 4.0–10.5)
nRBC: 0.9 % — ABNORMAL HIGH (ref 0.0–0.2)

## 2022-03-02 LAB — SURGICAL PATHOLOGY

## 2022-03-02 LAB — C-REACTIVE PROTEIN: CRP: 11.2 mg/dL — ABNORMAL HIGH (ref <1.0)

## 2022-03-02 MED ORDER — OXYCODONE HCL 5 MG PO TABS
5.0000 mg | ORAL_TABLET | ORAL | Status: DC | PRN
  Filled 2022-03-02: qty 1

## 2022-03-02 MED ORDER — NEPRO/CARBSTEADY PO LIQD
237.0000 mL | Freq: Three times a day (TID) | ORAL | Status: DC
  Administered 2022-03-03 (×2): 237 mL via ORAL

## 2022-03-02 MED ORDER — CHLORHEXIDINE GLUCONATE CLOTH 2 % EX PADS: 6.0000 | MEDICATED_PAD | Freq: Every day | CUTANEOUS | Status: DC

## 2022-03-02 NOTE — TOC Initial Note (Signed)
Transition of Care [facility]) - Initial/Assessment Note    Patient Details  Name: Adam Reid MRN: [id number] Date of Birth: Apr 15, 1946  Transition of Care [facility]) CM/SW Contact:    Tresa Endo Phone Number: 03/02/2022, 2:07 PM  Clinical Narrative:                 CSW received SNF consult. CSW met with pt and spouse at bedside. CSW introduced self and explained role at the hospital. Pt reports that PTA the pt lived home with spouse. PT reports pt is minA with ADLs and overall but needs modA for sit to stand.   CSW reviewed PT/OT recommendations for SNF. Pt spouse gave CSW permission to fax out to facilities in the area. Pt spouse would like for pt to go to [facility] they live in the area. CSW gave pt medicare.gov rating list to review.   CSW contacted [facility] who is able to accept pt. Pt has also already been approved at the HD center in [facility].   CSW will continue to follow, pt will likely DC on Thursday.     Expected Discharge Plan: Skilled Nursing Facility Barriers to Discharge: Continued Medical Work up   Patient Goals and CMS Choice Patient states their goals for this hospitalization and ongoing recovery are:: Rehab CMS Medicare.gov Compare Post Acute Care list provided to:: Patient Choice offered to / list presented to : Patient  Expected Discharge Plan and Services Expected Discharge Plan: [place] In-house Referral: Clinical Social Work Discharge Planning Services: NA Post Acute Care Choice: [place] Living arrangements for the past 2 months: Single Family Home                 DME Arranged: N/A DME Agency: NA       HH Arranged: PT, OT [place] Agency: [place] Date [place]: 02/17/22 Time [place]: [id number] Representative spoke with at [place]: Tommi Rumps  Prior Living Arrangements/Services Living arrangements for the past 2 months: [place] Lives with:: Spouse Patient  language and need for interpreter reviewed:: Yes (Pt speaks [place]) Do you feel safe going back to the place where you live?: Yes      Need for Family Participation in Patient Care: Yes (Comment) Care giver support system in place?: Yes (comment) Current home services: DME (O2 2L with [employer]) Criminal Activity/Legal Involvement Pertinent to Current Situation/Hospitalization: No - Comment as needed  Activities of Daily Living Home Assistive Devices/Equipment: Bedside commode/3-in-1, Built-in shower seat, Walker (specify type), Wheelchair (rolling) ADL Screening (condition at time of admission) Patient's cognitive ability adequate to safely complete daily activities?: Yes Is the patient deaf or have difficulty hearing?: Yes Does the patient have difficulty seeing, even when wearing glasses/contacts?: No Does the patient have difficulty concentrating, remembering, or making decisions?: No Patient able to express need for assistance with ADLs?: Yes Does the patient have difficulty dressing or bathing?: No Independently performs ADLs?: No Communication: Independent Dressing (OT): Needs assistance Is this a change from baseline?: Change from baseline, expected to last <3days Grooming: Needs assistance Is this a change from baseline?: Change from baseline, expected to last <3 days Feeding: Independent Bathing: Needs assistance Is this a change from baseline?: Change from baseline, expected to last >3 days Toileting: Needs assistance Is this a change from baseline?: Change from baseline, expected to last <3 days In/Out Bed: Needs assistance Is this a change from baseline?: Change from baseline, expected to last >  3 days Walks in Home: Needs assistance Is this a change from baseline?: Change from baseline, expected to last >3 days Does the patient have difficulty walking or climbing stairs?: Yes Weakness of Legs: Both Weakness of Arms/Hands: Right  Permission Sought/Granted Permission  sought to share information with : Facility Sport and exercise psychologist, Family Supports Permission granted to share information with : Yes, Verbal Permission Granted  Share Information with NAME: [name] (Spouse)   [phone]  Permission granted to share info w AGENCY: SNF  Permission granted to share info w Relationship: [name] (Spouse)   [phone]  Permission granted to share info w Contact Information: [name] (Spouse)   [phone]  Emotional Assessment Appearance:: Appears stated age Attitude/Demeanor/Rapport: Unable to Assess Affect (typically observed): Unable to Assess Orientation: : Oriented to Self, Oriented to Place, Oriented to  Time, Oriented to Situation Alcohol / Substance Use: Not Applicable Psych Involvement: No (comment)  Admission diagnosis:  Renal failure [N19] Patient Active Problem List   Diagnosis Date Noted   Elevated LFTs    Elevated bilirubin    Elevated alkaline phosphatase level    Sepsis due to gram-negative UTI ([place]) 02/14/2022   Hypertension    PAF (paroxysmal atrial fibrillation) (HCC)    Liver failure (HCC)    Paraganglioma (HCC)    Protein-calorie malnutrition, severe (HCC)    ESRD on dialysis ([place]) 02/11/2022   Chronic respiratory failure with hypoxia ([place]) 12/30/2021   Anemia    Type 2 diabetes mellitus (HCC)    Atrial flutter (HCC)    Protein-calorie malnutrition, moderate (HCC)    Abdominal mass 11/17/2021   CAP (community acquired pneumonia) 11/16/2021   Thyroid disease    Diabetes mellitus without complication (HCC)    Chronic kidney disease    Paroxysmal atrial fibrillation (HCC)    Iron deficiency anemia 09/16/2021   Acute on chronic diastolic CHF (congestive heart failure) ([place]) 09/14/2021   COVID-19 virus infection 09/14/2021   Acute renal failure superimposed on chronic kidney disease (HCC) 09/14/2021   Chronic kidney disease (CKD), stage IV (severe) (HCC) 06/09/2021   Symptomatic anemia 12/23/2020    GI bleed 12/23/2020   Paroxysmal A-fib (HCC) 12/23/2020   Chronic diastolic heart failure (HCC) 12/23/2020   Acute CHF (congestive heart failure) ([place]) 09/24/2019   ARF (acute renal failure) ([place]) 09/24/2019   Anemia of chronic disease 09/24/2019   Acute respiratory failure with hypoxia (HCC)    Secondary hyperparathyroidism, renal ([place]) 12/29/2016   Smoker 03/26/2016   Hypotension 01/29/2016   Abnormal myocardial perfusion study 01/29/2016   Depression 12/09/2015   Type 2 diabetes mellitus with hyperlipidemia (HCC)    HTN (hypertension) 06/24/2014   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[place]) 06/24/2014   BPH (benign prostatic hyperplasia) 06/24/2014   PCP:  [name], MD Pharmacy:   [facility] [phone], [address] AT [facility] OF [address] [place] [address] Phone: [phone] Fax: [phone]     Social Determinants of Health (SDOH) Interventions    Readmission Risk Interventions    02/17/2022    2:05 PM 12/15/2021    9:03 AM 11/16/2021    3:15 PM  Readmission Risk Prevention Plan  Transportation Screening Complete Complete Complete  PCP or Specialist Appt within 3-5 Days  Complete Complete  HRI or Home Care Consult  Complete Complete  Social Work Consult for Recovery Care Planning/Counseling  Complete Complete  Palliative Care Screening  Not  Applicable Not Applicable  Medication Review (RN Care Manager) Complete Complete Complete  PCP or Specialist appointment within 3-5 days of discharge Complete    HRI or [place] Complete    SW Recovery Care/Counseling Consult Complete    [place] Not Applicable

## 2022-03-02 NOTE — Progress Notes (Signed)
Occupational Therapy Treatment Patient Details Name: Adam Reid MRN: [id number] DOB: 1946/04/12 Today's Date: 03/02/2022   History of present illness Pt is a 76 y.o. male presenting 02/19/2022 with generalized weakness and bradycardia. 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 8/7 placement of a right IJ approach tunneled HD catheter. 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   OT comments  Patient supine in bed and agreeable to OT session this morning, eager to engage in ADLS at sink.  Continues to require mod assist for bed mobility, max assist for LB dressing, and mod-max assist for transfers.  Poor carryover with hand placement and technique during transfers, demonstrating increased weakness from last session with this OT and requiring more physical assistance. Up to min assist for mobility to/from sink, but  requires resting break at sink today due to decreased activity tolerance.  Pt reports "a little harder" than last time, but requires cueing for safety due to weakness and tolerance.  Updated dc plan to SNF due to decreased tolerance and anticipate 3hrs/day would be difficult at this time.  Will follow acutely.    Recommendations for follow up therapy are one component of a multi-disciplinary discharge planning process, led by the attending physician.  Recommendations may be updated based on patient status, additional functional criteria and insurance authorization.    Follow Up Recommendations  Skilled nursing-short term rehab (<3 hours/day)    Assistance Recommended at Discharge Frequent or constant Supervision/Assistance  Patient can return home with the following  Assistance with cooking/housework;Help with stairs or ramp for entrance;Assist for transportation;Direct supervision/assist for financial management;Direct supervision/assist for medications management;A lot of help with walking and/or transfers;A  lot of help with bathing/dressing/bathroom   Equipment Recommendations  None recommended by OT    Recommendations for Other Services      Precautions / Restrictions Precautions Precautions: Fall Precaution Comments: HD MWF Restrictions Weight Bearing Restrictions: No       Mobility Bed Mobility Overal bed mobility: Needs Assistance Bed Mobility: Supine to Sit, Rolling, Sit to Sidelying Rolling: Min assist   Supine to sit: Mod assist, HOB elevated   Sit to sidelying: Mod assist General bed mobility comments: mod assist to EOB with support for L LE and trunk, support for B LEs back to supine.    Transfers Overall transfer level: Needs assistance Equipment used: Rolling walker (2 wheels) Transfers: Sit to/from Stand Sit to Stand: Max assist, Mod assist           General transfer comment: cueing for hand placement with no carryover during session, requires max assist to power up from EOB and mod assist from arm chair     Balance Overall balance assessment: Needs assistance Sitting-balance support: No upper extremity supported, Feet supported Sitting balance-Leahy Scale: Good Sitting balance - Comments: preference to UE support due to decreased activity tolerance   Standing balance support: Bilateral upper extremity supported, During functional activity, Single extremity supported Standing balance-Leahy Scale: Poor Standing balance comment: relies on UE and external support                           ADL either performed or assessed with clinical judgement   ADL Overall ADL's : Needs assistance/impaired     Grooming: Minimal assistance;Standing Grooming Details (indicate cue type and reason): due to increased weakness, min assist to maintain standing balance and requires seated rest break  today             Lower Body Dressing: Maximal assistance;Sit to/from stand Lower Body Dressing Details (indicate cue type and reason): require assist to manage  socks, mod-max assist sit to stand Toilet Transfer: Moderate assistance;Maximal assistance;Ambulation;Rolling walker (2 wheels) Toilet Transfer Details (indicate cue type and reason): simulated in room, max assist from EOB to stand and mod from arm chair         Functional mobility during ADLs: Minimal assistance;Rolling walker (2 wheels) General ADL Comments: pt limited by generalized weakness, decreased activity tolerance and endurance. VSS on 3L.    Extremity/Trunk Assessment Upper Extremity Assessment Upper Extremity Assessment: Generalized weakness            Vision       Perception     Praxis      Cognition Arousal/Alertness: Awake/alert Behavior During Therapy: Flat affect Overall Cognitive Status: Impaired/Different from baseline Area of Impairment: Memory, Problem solving, Safety/judgement, Awareness                     Memory: Decreased short-term memory   Safety/Judgement: Decreased awareness of deficits, Decreased awareness of safety Awareness: Emergent Problem Solving: Slow processing, Requires verbal cues General Comments: pt requires cueing to recall hand placement and sequencing during transfers, cueing for awareness due to fatigue and safety        Exercises Exercises: General Upper Extremity General Exercises - Upper Extremity Shoulder Flexion: AROM, Both, 10 reps, Seated    Shoulder Instructions       General Comments VSS on 3L [place] (poor pleuth during activity but quickly recovers to >92% at rest)    Pertinent Vitals/ Pain       Pain Assessment Pain Assessment: No/denies pain  Home Living                                          Prior Functioning/Environment              Frequency  Min 2X/week        Progress Toward Goals  OT Goals(current goals can now be found in the care plan section)  Progress towards OT goals: Not progressing toward goals - comment;Goals drowngraded-see care plan (requires  increased physical assist, re-assess next session)  Acute Rehab OT Goals Patient Stated Goal: none stated Time For Goal Achievement: 03/16/22 Potential to Achieve Goals: Fair ADL Goals Pt Will Perform Grooming: standing;with supervision Pt Will Perform Upper Body Bathing: with supervision;sitting Pt Will Perform Lower Body Bathing: with min assist;sit to/from stand Pt Will Perform Upper Body Dressing: with set-up;sitting Pt Will Perform Lower Body Dressing: with mod assist;sit to/from stand;sitting/lateral leans Pt Will Transfer to Toilet: with supervision;ambulating;bedside commode Pt Will Perform Toileting - Clothing Manipulation and hygiene: sitting/lateral leans;with min assist;sit to/from stand Additional ADL Goal #1: Pt will demonstrate ability to complete bed mobility with supervision given increased time.  Plan Discharge plan needs to be updated;Frequency remains appropriate    Co-evaluation                 AM-PAC OT "6 Clicks" Daily Activity     Outcome Measure   Help from another person eating meals?: None Help from another person taking care of personal grooming?: A Little Help from another person toileting, which includes using toliet, bedpan, or urinal?: A Lot Help from another person bathing (including washing,  rinsing, drying)?: A Lot Help from another person to put on and taking off regular upper body clothing?: A Lot Help from another person to put on and taking off regular lower body clothing?: Total 6 Click Score: 14    End of Session Equipment Utilized During Treatment: Gait belt;Rolling walker (2 wheels);Oxygen  OT Visit Diagnosis: Unsteadiness on feet (R26.81);Other abnormalities of gait and mobility (R26.89);Pain   Activity Tolerance Patient limited by fatigue   Patient Left in bed;with call bell/phone within reach;with bed alarm set   Nurse Communication Mobility status        Time: 4287-6811 OT Time Calculation (min): 21 min  Charges: OT  General Charges $OT Visit: 1 Visit OT Treatments $Self Care/Home Management : 8-22 mins  Jolaine Artist, [place] Office [phone]   Delight Stare 03/02/2022, 9:43 AM

## 2022-03-02 NOTE — Progress Notes (Signed)
Patient ID: Adam Reid, male   DOB: October 30, 1945, 76 y.o.   MRN: [id number]    Progress Note from the Palliative Medicine Team at [facility]   Patient Name: Adam Reid        Date: 03/02/2022 DOB: [birth year]  Age: 76 y.o. MRN#: [id number] Attending Physician: Barb Merino, MD Primary Care Physician: [name], MD Admit Date: 03/05/2022   Medical records reviewed   76 y.o. male with a history of retroperitoneal paraganglioma, stage V CKD, chronic HFpEF, PAF who presented to the ED 8/2 with weakness, leg swelling. He was found to have junctional bradycardia with hyperkalemia, acidosis, uremia, volume overload, and elevated LFTs. Imaging revealed loculated right pleural effusion. Temporary HD catheter was inserted and HD initiated urgently on 8/3. The hospitalization has been complicated by respiratory failure, liver failure and thrombocytopenia, epistaxis, coffee-ground emesis, and anemia requiring transfusions.    Palliative care has been asked to get involved to have ongoing goals of care conversations in the setting of patients declining health state. PMT has worked extensively with this patient and family and past hospitalizations.   Patient and family face treatment option decisions, advanced directive decisions and anticipatory care needs.  This NP assessed patient at the bedside as a follow up for palliative medicine needs and emotional support.  Patient appears weak and fatigued.  Again created space and opportunity for wife to explore thoughts and feelings regarding current medical situation.  She  shares the difficulties of trying to care for her husband in her home over the past many weeks.  She reports continued physical and functional decline.  I shared generalized consensus and concern for patient's very poor clinical status and likely associated poor prognosis.  Wife verbalizes an understanding of the seriousness of his current medical situation and the likely  associated poor prognosis however at this time she continues with all offered and available medical interventions to prolong life, knowing that this is her husband's and son's wishes.  Education offered on self-care, specific to  persons acting as caregivers for family members.  Education offered today regarding  the importance of continued conversation with her family (pacifically her son Mike Craze who lives in [place] and is her main support person )and their  medical providers regarding overall plan of care and treatment options,  ensuring decisions are within the context of the patients values and GOCs.  Questions and concerns addressed   PMT will continue to support holistically  Plan is to meet tomorrow afternoon at 3:00  Wadie Lessen NP  Palliative Medicine Team Team Phone # [phone] Pager [phone]

## 2022-03-02 NOTE — Progress Notes (Signed)
Nutrition Follow-up  DOCUMENTATION CODES:   Severe malnutrition in context of chronic illness  INTERVENTION:   Discussed "pork allergy" with pt's wife. Wife indicates pt does not have an allergy, he just avoids pork products due to health and religious reasons. Wife ok for pt to receive Magic Cup at this time, even though it contains pork gelatin. Orders modified  Magic cup TID with meals, each supplement provides 290 kcal and 9 grams of protein  Pt may do NEPRO OR ENSURE supplement; pt only likes Administrator, Civil Service. Whichever pt one prefers is acceptable at this time  Continue Regular diet  Given malnutrition, poor oral intake and co-morbidities, pt is at risk for micronutrient deficiencies. Plan to check CRP, Vit A, Vit C, copper, and zinc  NUTRITION DIAGNOSIS:   Severe Malnutrition related to chronic illness (CKD now ESRD) as evidenced by severe muscle depletion, severe fat depletion.  Being addressed via supplements, regular diet  GOAL:   Patient will meet greater than or equal to 90% of their needs   MONITOR:   PO intake, Supplement acceptance, Labs, Weight trends, I & O's  REASON FOR ASSESSMENT:   Consult Diet education  ASSESSMENT:   76 yo male admitted with symptomatic bradycardia, anemia, hyperkalemia. PMH includes CKD 5, HTN, DM-2, HLD, CHF, GERD, PAF.  7/31 RUE AVG placed 8/03 HD initiated 8/07 tunneled HD cath 8/08 Right AVG wound dehiscence 8/17 Liver Biopsy- results pending  Pt remains on iHD  Awaiting results of liver biopsy; no evidence of malignancy at this time  Wife reports pt appetite is fair. Wife brought in food from home yesterday and pt ate well with this. Recorded po intake 0-100%. Encouraged wife to continue to bring in outside food from home if this improves appetite.  Wife reports pt drinking some of the Nepro, reports he likes "ok." Pt only likes Vanilla. Discussed Magic Cup supplement again; RD did not order last visit as pt with pork  allergy in chart and Magic Cup contains  Labs: sodium 132 (L), phosphorus 11.8 (H), potassium 5.0 (wdl) Meds: Rena-vite, ss novolog, aranesp, phoslo with meals, calcitriol  Diet Order:   Diet Order             Diet regular Room service appropriate? Yes; Fluid consistency: Thin  Diet effective now                   EDUCATION NEEDS:   Education needs have been addressed  Skin:  Skin Assessment: Skin Integrity Issues: Skin Integrity Issues:: DTI DTI: sacrum  Last BM:  8/20  Height:   Ht Readings from Last 1 Encounters:  02/11/22 '5\' 6"'$  (1.676 m)    Weight:   Wt Readings from Last 1 Encounters:  03/02/22 48.5 kg    Ideal Body Weight:  64.5 kg  BMI:  Body mass index is 17.25 kg/m.  Estimated Nutritional Needs:   Kcal:  1900-2200  Protein:  85-100 gm  Fluid:  1 L + UOP    Kerman Passey MS, RDN, LDN, CNSC Registered Dietitian 3 Clinical Nutrition RD Pager and On-Call Pager Number Located in [place]

## 2022-03-02 NOTE — Progress Notes (Signed)
PROGRESS NOTE    Adam Reid  WUX:[id number] DOB: 1946-06-05 DOA: 02/18/2022 PCP: [name], MD    Brief Narrative:  76 y.o. male with a history of retroperitoneal paraganglioma, stage V CKD, chronic HFpEF, PAF who presented to the ED 8/2 with weakness, leg swelling. He was found to have junctional bradycardia with hyperkalemia, acidosis, uremia, volume overload, and elevated LFTs. Imaging revealed loculated right pleural effusion. Temporary HD catheter was inserted and HD initiated urgently on 8/3. The hospitalization has been complicated by respiratory failure, liver failure and thrombocytopenia, epistaxis, coffee-ground emesis, and anemia requiring transfusions.  -Now started on hemodialysis Monday Wednesday Friday, permacath in place.  Right upper extremity AV graft was placed 7/31. -Persistently elevated bilirubin.  Liver punch biopsy and ascitic fluid analysis 8/17.  Biopsies pending.  Remains in very poor clinical status.  Frail debilitated and malnourished and bilirubin continues to rise.  Liver biopsy results are still pending.  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  Currently on Monday Wednesday Friday schedule. Tunnel catheter 8/7.  AV graft 7/31. Nephrology to attempt dialysis in chair today to transition to SNF.  Acute on chronic hypoxemic respiratory failure: On 2 to 3 L of oxygen at home for several months.  Respiratory status remains stable.  Euvolemic.  Acute liver failure with coagulopathy: Transaminases are improving, bilirubin is worsening. CT scan without biliary obstruction, Dopplers negative, serological work-up including ANA, smooth muscle IgG, CMV, EBV, hepatitis panel is negative. Initially suspected of congestive hepatopathy, however with persistent elevated LFTs suspecting intrinsic liver disease. Underwent core liver biopsy 8/17.  Results pending.   Paracentesis with bloody fluid, hemorrhagic fluid.  Negative for  malignancy. No evidence of metastatic disease, pathology pending. GI waiting for biopsy results.  Acute blood loss anemia on chronic anemia of iron deficiency and CKD: Received 3 units of PRBC, ESA with hemodialysis.  Hemoglobin is stable now.  Coffee-ground emesis, history of GERD and Barrett's esophagus: On PPI.  Conservative management.  Paroxysmal A-fib with junctional bradycardia on arrival: Resolved.  On diltiazem for rate control.  Very frail debilitated.  Not an anticoagulation candidate.  Sepsis due to Pseudomonas UTI: Treated with cefepime.  Clinically improved.  Type 2 diabetes, well controlled with hyperglycemia: On SSI.  Continue.  Nutrition Status: Nutrition Problem: Severe Malnutrition Etiology: chronic illness (CKD now ESRD) Signs/Symptoms: severe muscle depletion, severe fat depletion Interventions: Nepro shake, MVI, Education, Liberalize Diet   Goal of care: Remains very frail and debilitated.  Multiple recent hospitalizations.  Now on hemodialysis. Severe malnutrition and failure to thrive. Newly developed progressive liver failure. Continue to work with PT OT. Referral to SNF for rehab once liver failure work-up is completed. If patient is unable to tolerate hemodialysis on chair, cannot under go hemodialysis outpatient.  May need to consider hospice.   DVT prophylaxis: SCDs Start: 02/11/22 0027   Code Status: DNR/DNI Family Communication: None today. Disposition Plan: Status is: Inpatient Remains inpatient appropriate because: Worsening bilirubin levels. CIR versus SNF.     Consultants:  Nephrology Gastroenterology Palliative  Procedures:  Multiple procedures as above  Antimicrobials:  Completed antibiotics   Subjective: Patient seen and examined.  Poor historian.  Tells me he is doing fine and always denies any complaints. Exhausted after participating with therapies.  Objective: Vitals:   03/01/22 1919 03/01/22 2300 03/02/22 0305  03/02/22 0743  BP: (!) 123/47 (!) 130/58 (!) 106/47 (!) 115/43  Pulse: 80 80 78 76  Resp: 18 15 15  19  Temp: 98 F (36.7 C) 98.1 F (36.7 C) 98.1 F (36.7 C) 97.6 F (36.4 C)  TempSrc: Oral Oral Oral Oral  SpO2: 98% 100% 100% 100%  Weight:   48.5 kg   Height:        Intake/Output Summary (Last 24 hours) at 03/02/2022 1033 Last data filed at 03/01/2022 1700 Gross per 24 hour  Intake 2240 ml  Output 1500 ml  Net 740 ml   Filed Weights   03/01/22 1246 03/01/22 1250 03/02/22 0305  Weight: 52.6 kg 52.6 kg 48.5 kg    Examination:  General exam: Frail and debilitated.  Deeply icteric. Respiratory system: Looks comfortable on 2 L oxygen.   Cardiovascular system: S1 & S2 heard, RRR.  2+ bilateral pedal edema, more on dorsum of the foot. Gastrointestinal system: Nontender. Central nervous system: Alert and oriented. No focal neurological deficits.  Generalized weakness. Permacath right IJ. AV fistula right arm looks clean and dry.  Sutures intact.    Data Reviewed: I have personally reviewed following labs and imaging studies  CBC: Recent Labs  Lab 02/24/22 0048 02/26/22 2245 02/27/22 0713 03/01/22 1252 03/02/22 0625  WBC 15.6* 13.9* 12.8* 13.6* 12.1*  NEUTROABS  --  12.5* 11.5*  --   --   HGB 10.4* 9.2* 10.5* 9.7* 10.3*  HCT 30.3* 27.1* 31.0* 29.1* 30.1*  MCV 91.0 91.9 90.4 93.6 91.8  PLT 138* 94* 88* 92* 91*   Basic Metabolic Panel: Recent Labs  Lab 02/24/22 0048 02/25/22 0039 02/26/22 0045 02/26/22 2245 02/27/22 0713 03/01/22 0121 03/01/22 1252  NA 131*   < > 135 135 133* 133* 132*  K 4.5   < > 4.0 4.2 3.9 5.0 5.0  CL 92*   < > 97* 97* 93* 96* 95*  CO2 22   < > 22 20* 27 20* 19*  GLUCOSE 131*   < > 128* 133* 201* 113* 117*  BUN 52*   < > 51* 66* 26* 60* 70*  CREATININE 4.03*   < > 3.84* 4.64* 2.26* 4.20* 4.54*  CALCIUM 6.3*   < > 7.1* 6.8* 7.8* 7.6* 7.4*  MG 2.0  --   --   --   --   --   --   PHOS  --   --   --   --   --   --  11.8*   < > = values in  this interval not displayed.   GFR: Estimated Creatinine Clearance: 9.8 mL/min (A) (by C-G formula based on SCr of 4.54 mg/dL (H)). Liver Function Tests: Recent Labs  Lab 02/25/22 0039 02/26/22 0045 02/26/22 2245 02/27/22 0713 03/01/22 0121 03/01/22 1252  AST 269* 245* 210* 240* 189*  --   ALT 122* 94* 48* 49* 33  --   ALKPHOS 2,226* 2,178* 2,006* 2,125* 1,758*  --   BILITOT 18.5* 20.2* 20.7* 22.2* 23.9*  --   PROT 5.3* 5.2* 4.8* 5.6* 5.4*  --   ALBUMIN 1.8* 1.9* 1.7* 2.0* 1.8* 1.7*   No results for input(s): "LIPASE", "AMYLASE" in the last 168 hours.  No results for input(s): "AMMONIA" in the last 168 hours. Coagulation Profile: Recent Labs  Lab 02/24/22 0048 02/27/22 0713  INR 1.2 1.2   Cardiac Enzymes: No results for input(s): "CKTOTAL", "CKMB", "CKMBINDEX", "TROPONINI" in the last 168 hours. BNP (last 3 results) Recent Labs    12/10/21 1339  PROBNP 12,645*   HbA1C: No results for input(s): "HGBA1C" in the last 72 hours. CBG: Recent Labs  Lab 03/01/22 0640 03/01/22 1112 03/01/22 1709 03/01/22 2120 03/02/22 0607  GLUCAP 83 166* 81 189* 115*   Lipid Profile: No results for input(s): "CHOL", "HDL", "LDLCALC", "TRIG", "CHOLHDL", "LDLDIRECT" in the last 72 hours. Thyroid Function Tests: No results for input(s): "TSH", "T4TOTAL", "FREET4", "T3FREE", "THYROIDAB" in the last 72 hours. Anemia Panel: No results for input(s): "VITAMINB12", "FOLATE", "FERRITIN", "TIBC", "IRON", "RETICCTPCT" in the last 72 hours. Sepsis Labs: No results for input(s): "PROCALCITON", "LATICACIDVEN" in the last 168 hours.  Recent Results (from the past 240 hour(s))  Body fluid culture w Gram Stain     Status: None   Collection Time: 02/25/22 11:07 AM   Specimen: Peritoneal Washings; Pleural Fluid  Result Value Ref Range Status   Specimen Description PERITONEAL  Final   Special Requests NONE  Final   Gram Stain   Final    WBC PRESENT, PREDOMINANTLY MONONUCLEAR NO ORGANISMS SEEN     Culture   Final    NO GROWTH 3 DAYS Performed at [facility], [address], [place], [place] [address]    Report Status 02/28/2022 FINAL  Final         Radiology Studies: No results found.      Scheduled Meds:  sodium chloride   Intravenous Once   calcitRIOL  0.25 mcg Oral Q M,W,F   calcium acetate  667 mg Oral TID WC   Chlorhexidine Gluconate Cloth  6 each Topical Q0600   Chlorhexidine Gluconate Cloth  6 each Topical Q0600   darbepoetin (ARANESP) injection - DIALYSIS  200 mcg Subcutaneous Q Mon-HD   diltiazem  180 mg Oral Daily   feeding supplement (NEPRO CARB STEADY)  237 mL Oral BID   insulin aspart  0-6 Units Subcutaneous TID WC   lidocaine-EPINEPHrine  20 mL Intradermal Once   multivitamin  1 tablet Oral QHS   pantoprazole  40 mg Oral Q0600   sodium chloride flush  3 mL Intravenous Q12H   traZODone  50 mg Oral QHS   Continuous Infusions:  sodium chloride Stopped (02/12/22 1243)     LOS: 19 days    Time spent: 35 minutes    Barb Merino, MD [facility] Pager [phone]

## 2022-03-02 NOTE — Progress Notes (Signed)
Physical Therapy Treatment Patient Details Name: Adam Reid MRN: [id number] DOB: 05/19/1946 Today's Date: 03/02/2022   History of Present Illness Pt is a 76 y.o. male presenting 03/07/2022 with generalized weakness and bradycardia. 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 8/7 placement of a right IJ approach tunneled HD catheter. 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    PT Comments    Pt received supine, video interpreter used via ipad, and pt agreeable to session, however pt limited by increased fatigue this date and session limited by arrival of lab for draw. Pt able to come to sitting EOB with mod assist to elevate trunk. Pt with multiple trials to come to standing with up to max assist with pt unable to come to full standing secondary to fatigue. Pt able to clear hips during transfer attempts, pt with poor recall for hand placement, needing frequent cues. Plan to continue OOB mobility within pt tolerance in next session.  Pt continues to benefit from skilled PT services to progress toward functional mobility goals.    Recommendations for follow up therapy are one component of a multi-disciplinary discharge planning process, led by the attending physician.  Recommendations may be updated based on patient status, additional functional criteria and insurance authorization.  Follow Up Recommendations  Acute inpatient rehab (3hours/day)     Assistance Recommended at Discharge Frequent or constant Supervision/Assistance  Patient can return home with the following A little help with walking and/or transfers;A little help with bathing/dressing/bathroom;Assistance with cooking/housework;Direct supervision/assist for medications management;Assist for transportation;Direct supervision/assist for financial management;Help with stairs or ramp for entrance   Equipment Recommendations  None recommended by PT     Recommendations for Other Services      Precautions / Restrictions Precautions Precautions: Fall Precaution Comments: HD MWF Restrictions Weight Bearing Restrictions: No     Mobility  Bed Mobility Overal bed mobility: Needs Assistance Bed Mobility: Supine to Sit, Rolling, Sit to Sidelying Rolling: Min assist   Supine to sit: Mod assist, HOB elevated   Sit to sidelying: Mod assist General bed mobility comments: mod assist to EOB with support for trunk, support for B LEs back to supine.    Transfers Overall transfer level: Needs assistance Equipment used: Rolling walker (2 wheels) Transfers: Sit to/from Stand Sit to Stand: Total assist           General transfer comment: cueing for hand placement with no carryover during session, required total assist to power up from EOB for multiple trials without sucess secondary to fatigue    Ambulation/Gait                   Stairs             Wheelchair Mobility    Modified Rankin (Stroke Patients Only)       Balance Overall balance assessment: Needs assistance Sitting-balance support: No upper extremity supported, Feet supported Sitting balance-Leahy Scale: Good Sitting balance - Comments: preference to UE support due to decreased activity tolerance   Standing balance support: Bilateral upper extremity supported, During functional activity, Single extremity supported Standing balance-Leahy Scale: Poor Standing balance comment: relies on UE and external support                            Cognition Arousal/Alertness: Awake/alert Behavior During Therapy: Flat affect Overall Cognitive Status: Impaired/Different from baseline Area of Impairment:  Memory, Problem solving, Safety/judgement, Awareness                     Memory: Decreased short-term memory   Safety/Judgement: Decreased awareness of deficits, Decreased awareness of safety Awareness: Emergent Problem Solving: Slow  processing, Requires verbal cues General Comments: pt requires cueing to recall hand placement and sequencing during transfers, cueing for awareness due to fatigue and safety        Exercises      General Comments General comments (skin integrity, edema, etc.): VSS on 3L      Pertinent Vitals/Pain Pain Assessment Pain Assessment: No/denies pain Pain Intervention(s): Monitored during session    Home Living                          Prior Function            PT Goals (current goals can now be found in the care plan section) Acute Rehab PT Goals PT Goal Formulation: With patient Time For Goal Achievement: 02/28/22    Frequency    Min 3X/week      PT Plan      Co-evaluation              AM-PAC PT "6 Clicks" Mobility   Outcome Measure  Help needed turning from your back to your side while in a flat bed without using bedrails?: A Little Help needed moving from lying on your back to sitting on the side of a flat bed without using bedrails?: A Little Help needed moving to and from a bed to a chair (including a wheelchair)?: A Little Help needed standing up from a chair using your arms (e.g., wheelchair or bedside chair)?: A Lot Help needed to walk in hospital room?: A Lot Help needed climbing 3-5 steps with a railing? : A Lot 6 Click Score: 15    End of Session Equipment Utilized During Treatment: Oxygen Activity Tolerance: Patient limited by fatigue Patient left: in bed;with call bell/phone within reach;Other (comment) (witn LAB present) Nurse Communication: Mobility status PT Visit Diagnosis: Other abnormalities of gait and mobility (R26.89);Muscle weakness (generalized) (M62.81)     Time: 6295-2841 PT Time Calculation (min) (ACUTE ONLY): 19 min  Charges:  $Therapeutic Activity: 8-22 mins                     Martese Vanatta R. PTA Acute Rehabilitation Services Office: [place] 03/02/2022, 4:04 PM

## 2022-03-02 NOTE — Telephone Encounter (Signed)
Chart reviewed. Pt still in hospital:

## 2022-03-02 NOTE — Progress Notes (Addendum)
[facility] Progress Note  Subjective: patient seen in room.   Vitals:   03/01/22 2300 03/02/22 0305 03/02/22 0743 03/02/22 1142  BP: (!) 130/58 (!) 106/47 (!) 115/43 (!) 138/56  Pulse: 80 78 76 79  Resp: '15 15 19 20  '$ Temp: 98.1 F (36.7 C) 98.1 F (36.7 C) 97.6 F (36.4 C) 97.8 F (36.6 C)  TempSrc: Oral Oral Oral Oral  SpO2: 100% 100% 100% 98%  Weight:  48.5 kg    Height:        Exam: Gen: NAD, cachectic, jaundiced, pleasant CV: RRR Resp: CTA BL Abd: soft, nt/nd Ext: very trace edema b/l LEs Neuro: awake, alert Dialysis access: RIJ TDC, RUE AVG +b/t   NEW ESRD: set up for OP HD at [facility] on MWF 12:15 chair time - last hep B labs: 8/2, non-immune  Summary: CKD V patient f/b Dr Graylon Gunning, recent AVF placement in R arm (the R AVF placed in Feb 2023 failed and on 02/08/22 had a new RUA AVG placed). Pt also recently dx'd w/ L retroperitoneal paraganglioma came to ED w/ progressive fatigue, weakness and SOB. K+ 6.6 + junctional bradycardia. Pt agreed to start dialysis due to life-threatening cardiac instability. CCM placed temp cath and pt was dialyzed. Amio/ diltiazem were held. IR saw him and placed TDC. Pt improved and was CLIP'd to [facility] on MWF. Admit complicated by pseudomonas UTI from 8/5. FTT felt to be due to ganglioma.   IP HD: 3-4h, 2K, heparin 2000 prn, 1.5 L UF ave Hep B labs done 02/27/2022 here    Assessment/ Plan: New ESRD - started HD here 8/3. Accepted to [facility] on MWF schedule. Cont HD MWF here. HD tomorrow.  Acute/ chronic hypoxic resp failure - acute phase resolved. Wears home O2 2-3 L [place]. Wt's down and no vol excess.  RUE AVG - placed 7/31. Using [facility] now.  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 Cont calcitriol and calc acetate. CCa improving.  HTN/ vol - BP's low-normal, getting cardizem 180 daily for afib. Pt in NSR now.  Euvolemic on exam. UF 1-1.5 L w/ next HD.  PAF - on cardizem for afib Liver failure with coagulopathy - workup negative.  Per primary and GI. +hemosiderosis. Liver biopsy 8/17 Sepsis due to pseudomonas UTI - resolved, sp course of IV abx. Retroperitoneal paraganglioma - to be seen at [facility] as he was deemed too high risk for surgery here.  DNR - Palliative consulting, pt DNR now but full medical care otherwise. I have encourage pt to get OOB to chair as much as possible as he will not be able to discharge until he can get sit in chair for full HD. After d/w his wife also.   Kelly Splinter, MD 03/02/2022, 1:47 PM   Recent Labs  Lab 03/01/22 0121 03/01/22 1252 03/02/22 0625  HGB  --  9.7* 10.3*  ALBUMIN 1.8* 1.7*  --   CALCIUM 7.6* 7.4*  --   PHOS  --  11.8*  --   CREATININE 4.20* 4.54*  --   K 5.0 5.0  --     No results for input(s): "IRON", "TIBC", "FERRITIN" in the last 168 hours. Inpatient medications:  sodium chloride   Intravenous Once   calcitRIOL  0.25 mcg Oral Q M,W,F   calcium acetate  667 mg Oral  TID WC   Chlorhexidine Gluconate Cloth  6 each Topical Q0600   Chlorhexidine Gluconate Cloth  6 each Topical Q0600   darbepoetin (ARANESP) injection - DIALYSIS  200 mcg Subcutaneous Q Mon-HD   diltiazem  180 mg Oral Daily   feeding supplement (NEPRO CARB STEADY)  237 mL Oral BID   insulin aspart  0-6 Units Subcutaneous TID WC   lidocaine-EPINEPHrine  20 mL Intradermal Once   multivitamin  1 tablet Oral QHS   pantoprazole  40 mg Oral Q0600   sodium chloride flush  3 mL Intravenous Q12H   traZODone  50 mg Oral QHS    sodium chloride Stopped (02/12/22 1243)   fentaNYL, HYDROmorphone (DILAUDID) injection, lidocaine (PF), midazolam, ondansetron (ZOFRAN) IV, mouth rinse, oxyCODONE, prochlorperazine, senna, sodium chloride flush

## 2022-03-02 NOTE — NC FL2 (Signed)
[place] MEDICAID FL2 LEVEL OF CARE SCREENING TOOL     IDENTIFICATION  Patient Name: Adam Reid Birthdate: 1946/05/29 Sex: male Admission Date (Current Location): 03/10/2022  [facility] and [place] Number:  Herbalist and Address:  [facility] [place], [place] [address], [place], [place] [address]      Provider Number: [id number]  Attending Physician Name and Address:  Barb Merino, MD  Relative Name and Phone Number:  [name] (Spouse)   [phone]    Current Level of Care: Hospital Recommended Level of Care: [place] Prior Approval Number:    Date Approved/Denied:   PASRR Number: [id number]  Discharge Plan: SNF    Current Diagnoses: Patient Active Problem List   Diagnosis Date Noted   Elevated LFTs    Elevated bilirubin    Elevated alkaline phosphatase level    Sepsis due to gram-negative UTI ([place]) 02/14/2022   Hypertension    PAF (paroxysmal atrial fibrillation) (HCC)    Liver failure (HCC)    Paraganglioma (HCC)    Protein-calorie malnutrition, severe ([place])    ESRD on dialysis ([place]) 02/11/2022   Chronic respiratory failure with hypoxia ([place]) 12/30/2021   Anemia    Type 2 diabetes mellitus (HCC)    Atrial flutter ([place])    Protein-calorie malnutrition, moderate ([place])    Abdominal mass 11/17/2021   CAP (community acquired pneumonia) 11/16/2021   Thyroid disease    Diabetes mellitus without complication ([place])    Chronic kidney disease    Paroxysmal atrial fibrillation (HCC)    Iron deficiency anemia 09/16/2021   Acute on chronic diastolic CHF (congestive heart failure) ([place]) 09/14/2021   COVID-19 virus infection 09/14/2021   Acute renal failure superimposed on chronic kidney disease ([place]) 09/14/2021   Chronic kidney disease (CKD), stage IV (severe) (HCC) 06/09/2021   Symptomatic anemia 12/23/2020   GI bleed 12/23/2020   Paroxysmal A-fib ([place]) 12/23/2020   Chronic diastolic heart failure (HCC)  12/23/2020   Acute CHF (congestive heart failure) ([place]) 09/24/2019   ARF (acute renal failure) ([place]) 09/24/2019   Anemia of chronic disease 09/24/2019   Acute respiratory failure with hypoxia (HCC)    Secondary hyperparathyroidism, 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[place]) 06/24/2014   BPH (benign prostatic hyperplasia) 06/24/2014    Orientation RESPIRATION BLADDER Height & Weight     Self, Time, Situation, Place    Continent Weight: 106 lb 14.4 oz (48.5 kg) Height:  '5\' 6"'$  (167.6 cm)  BEHAVIORAL SYMPTOMS/MOOD NEUROLOGICAL BOWEL NUTRITION STATUS      Continent Diet (See DC Summary)  AMBULATORY STATUS COMMUNICATION OF NEEDS Skin   Limited Assist   Surgical wounds                       Personal Care Assistance Level of Assistance  Bathing, Feeding, Dressing Bathing Assistance: Limited assistance Feeding assistance: Independent Dressing Assistance: Limited assistance     Functional Limitations Info  Sight, Hearing, Speech Sight Info: Adequate Hearing Info: Adequate Speech Info: Adequate    SPECIAL CARE FACTORS FREQUENCY  PT (By licensed PT), OT (By licensed OT)     PT Frequency: 5x a week OT Frequency: 5x a week  Contractures Contractures Info: Not present    Additional Factors Info  Code Status, Allergies Code Status Info: DNR Allergies Info: Pork-derived Products           Current Medications (03/02/2022):  This is the current hospital active medication list Current Facility-Administered Medications  Medication Dose Route Frequency Provider Last Rate Last Admin   0.9 %  sodium chloride infusion (Manually program via Guardrails IV Fluids)   Intravenous Once Vance Gather B, MD        0.9 %  sodium chloride infusion  250 mL Intravenous Continuous Frederik Pear, MD   Stopped at 02/12/22 1243   calcitRIOL (ROCALTROL) capsule 0.25 mcg  0.25 mcg Oral Q M,W,F Gean Quint, MD   0.25 mcg at 03/01/22 0908   calcium acetate (PHOSLO) capsule 667 mg  667 mg Oral TID WC Gean Quint, MD   667 mg at 03/02/22 1226   Chlorhexidine Gluconate Cloth 2 % PADS 6 each  6 each Topical Q0600 Roney Jaffe, MD   6 each at 03/02/22 6606   Chlorhexidine Gluconate Cloth 2 % PADS 6 each  6 each Topical Q0600 Roney Jaffe, MD       [START ON 03/03/2022] Chlorhexidine Gluconate Cloth 2 % PADS 6 each  6 each Topical Q0600 Roney Jaffe, MD       Darbepoetin Alfa (ARANESP) injection 200 mcg  200 mcg Subcutaneous Q Mon-HD Roney Jaffe, MD   200 mcg at 03/01/22 1637   diltiazem (CARDIZEM CD) 24 hr capsule 180 mg  180 mg Oral Daily Manuella Ghazi, Pratik D, DO   180 mg at 03/02/22 1041   feeding supplement (NEPRO CARB STEADY) liquid 237 mL  237 mL Oral BID Donne Hazel, MD   237 mL at 03/01/22 2133   fentaNYL (SUBLIMAZE) injection   Intravenous PRN Mir, Paula Libra, MD   25 mcg at 02/25/22 1003   HYDROmorphone (DILAUDID) injection 0.5 mg  0.5 mg Intravenous Q3H PRN Manuella Ghazi, Pratik D, DO   0.5 mg at 03/02/22 1041   insulin aspart (novoLOG) injection 0-6 Units  0-6 Units Subcutaneous TID WC Vance Gather B, MD   1 Units at 03/02/22 1226   lidocaine (PF) (XYLOCAINE) 1 % injection    PRN Corrie Mckusick, DO   10 mL at 02/18/22 0911   lidocaine-EPINEPHrine (XYLOCAINE W/EPI) 1 %-1:100000 (with pres) injection 20 mL  20 mL Intradermal Once Patrecia Pour, MD       midazolam (VERSED) injection   Intravenous PRN Mir, Paula Libra, MD   0.5 mg at 02/25/22 1003   multivitamin (RENA-VIT) tablet 1 tablet  1 tablet Oral QHS Collene Gobble, MD   1 tablet at 03/01/22 2133   ondansetron (ZOFRAN) injection 4 mg  4 mg Intravenous Q6H PRN Manuella Ghazi, Pratik D, DO   4 mg at 03/02/22 0147   Oral care mouth rinse  15 mL Mouth Rinse PRN Nolberto Hanlon, MD       oxyCODONE (Oxy IR/ROXICODONE) immediate release tablet 5 mg  5 mg Oral Q4H PRN Barb Merino, MD       pantoprazole (PROTONIX) EC tablet 40 mg  40 mg Oral Q0600 Vena Rua, PA-C   40 mg at 03/02/22 3016   prochlorperazine (COMPAZINE) injection 10 mg  10 mg Intravenous Q6H PRN Etta Quill, DO       senna (SENOKOT) tablet 17.2 mg  2 tablet Oral Daily PRN Patrecia Pour, MD       sodium chloride flush (  NS) 0.9 % injection 3 mL  3 mL Intravenous Q12H Vance Gather B, MD   3 mL at 03/01/22 2134   sodium chloride flush (NS) 0.9 % injection 3 mL  3 mL Intravenous PRN Patrecia Pour, MD       traZODone (DESYREL) tablet 50 mg  50 mg Oral QHS Patrecia Pour, MD   50 mg at 03/01/22 2133     Discharge Medications: Please see discharge summary for a list of discharge medications.  Relevant Imaging Results:  Relevant Lab Results:   Additional Information SSN: [id number]  Reece Agar, [place]

## 2022-03-03 DIAGNOSIS — I5032 Chronic diastolic (congestive) heart failure: Secondary | ICD-10-CM | POA: Diagnosis not present

## 2022-03-03 DIAGNOSIS — R7989 Other specified abnormal findings of blood chemistry: Secondary | ICD-10-CM | POA: Diagnosis not present

## 2022-03-03 DIAGNOSIS — K7200 Acute and subacute hepatic failure without coma: Secondary | ICD-10-CM | POA: Diagnosis not present

## 2022-03-03 DIAGNOSIS — N189 Chronic kidney disease, unspecified: Secondary | ICD-10-CM | POA: Diagnosis not present

## 2022-03-03 DIAGNOSIS — K761 Chronic passive congestion of liver: Secondary | ICD-10-CM | POA: Diagnosis not present

## 2022-03-03 DIAGNOSIS — R627 Adult failure to thrive: Secondary | ICD-10-CM | POA: Diagnosis not present

## 2022-03-03 DIAGNOSIS — E875 Hyperkalemia: Secondary | ICD-10-CM | POA: Diagnosis not present

## 2022-03-03 DIAGNOSIS — N186 End stage renal disease: Secondary | ICD-10-CM | POA: Diagnosis not present

## 2022-03-03 DIAGNOSIS — R17 Unspecified jaundice: Secondary | ICD-10-CM | POA: Diagnosis not present

## 2022-03-03 LAB — COMPREHENSIVE METABOLIC PANEL
ALT: 31 U/L (ref 0–44)
AST: 155 U/L — ABNORMAL HIGH (ref 15–41)
Albumin: 1.8 g/dL — ABNORMAL LOW (ref 3.5–5.0)
Alkaline Phosphatase: 1473 U/L — ABNORMAL HIGH (ref 38–126)
Anion gap: 16 — ABNORMAL HIGH (ref 5–15)
BUN: 62 mg/dL — ABNORMAL HIGH (ref 8–23)
CO2: 23 mmol/L (ref 22–32)
Calcium: 7.3 mg/dL — ABNORMAL LOW (ref 8.9–10.3)
Chloride: 95 mmol/L — ABNORMAL LOW (ref 98–111)
Creatinine, Ser: 4.29 mg/dL — ABNORMAL HIGH (ref 0.61–1.24)
GFR, Estimated: 14 mL/min — ABNORMAL LOW (ref >60)
Glucose, Bld: 97 mg/dL (ref 70–99)
Potassium: 5.8 mmol/L — ABNORMAL HIGH (ref 3.5–5.1)
Sodium: 134 mmol/L — ABNORMAL LOW (ref 135–145)
Total Bilirubin: 23.3 mg/dL (ref 0.3–1.2)
Total Protein: 4.9 g/dL — ABNORMAL LOW (ref 6.5–8.1)

## 2022-03-03 LAB — CBC
HCT: 27.1 % — ABNORMAL LOW (ref 39.0–52.0)
Hemoglobin: 9.2 g/dL — ABNORMAL LOW (ref 13.0–17.0)
MCH: 30.9 pg (ref 26.0–34.0)
MCHC: 33.9 g/dL (ref 30.0–36.0)
MCV: 90.9 fL (ref 80.0–100.0)
Platelets: 144 10*3/uL — ABNORMAL LOW (ref 150–400)
RBC: 2.98 MIL/uL — ABNORMAL LOW (ref 4.22–5.81)
RDW: 24.4 % — ABNORMAL HIGH (ref 11.5–15.5)
WBC: 11.5 10*3/uL — ABNORMAL HIGH (ref 4.0–10.5)
nRBC: 1 % — ABNORMAL HIGH (ref 0.0–0.2)

## 2022-03-03 LAB — GLUCOSE, CAPILLARY
Glucose-Capillary: 90 mg/dL (ref 70–99)
Glucose-Capillary: 144 mg/dL — ABNORMAL HIGH (ref 70–99)
Glucose-Capillary: 80 mg/dL (ref 70–99)
Glucose-Capillary: 124 mg/dL — ABNORMAL HIGH (ref 70–99)

## 2022-03-03 MED ORDER — HEPARIN SODIUM (PORCINE) 1000 UNIT/ML DIALYSIS
2000.0000 [IU] | INTRAMUSCULAR | Status: DC | PRN
  Filled 2022-03-03: qty 2

## 2022-03-03 MED ORDER — ANTICOAGULANT SODIUM CITRATE 4% (200MG/5ML) IV SOLN
5.0000 mL | Status: DC | PRN
Start: 2022-03-03 — End: 2022-03-03
  Administered 2022-03-03: 5 mL
  Filled 2022-03-03: qty 5

## 2022-03-03 MED ORDER — ALTEPLASE 2 MG IJ SOLR
2.0000 mg | Freq: Once | INTRAMUSCULAR | Status: DC | PRN
Start: 2022-03-03 — End: 2022-03-03

## 2022-03-03 MED ORDER — LIDOCAINE-PRILOCAINE 2.5-2.5 % EX CREA
1.0000 | TOPICAL_CREAM | CUTANEOUS | Status: DC | PRN
Start: 2022-03-03 — End: 2022-03-03

## 2022-03-03 NOTE — Progress Notes (Signed)
Inpatient Rehab Admissions Coordinator:    CIR won expedited appeal but Pt.'s wife now wanting [facility], CIR will sign off.  Clemens Catholic, [place], [place] Admissions Coordinator  [phone] ([place]) [phone] (office)

## 2022-03-03 NOTE — Progress Notes (Addendum)
Progress Note   Subjective  Chief Complaint: Elevated liver enzymes  Today, patient is seen with the nurses by his side, they explained that there has been no real change in status.  I did call his wife as well, as he does not speak [place] well.  She tells me the hospitalist has been discussing send the patient to rehab at some point soon.   Objective   Vital signs in last 24 hours: Temp:  [97.5 F (36.4 C)-97.9 F (36.6 C)] 97.9 F (36.6 C) (08/23 0757) Pulse Rate:  [72-79] 74 (08/23 0757) Resp:  [10-20] 12 (08/23 0757) BP: (101-138)/(40-78) 114/78 (08/23 0757) SpO2:  [97 %-100 %] 100 % (08/23 0757) Weight:  [54.6 kg] 54.6 kg (08/23 0644) Last BM Date : 03/02/22 General:    Jaundiced male in NAD Heart:  Regular rate and rhythm; no murmurs Lungs: Respirations even and unlabored, lungs CTA bilaterally Abdomen:  Soft, nontender and nondistended. Normal bowel sounds. Psych:  Cooperative. Normal mood and affect.  Lab Results: Recent Labs    03/01/22 1252 03/02/22 0625 03/03/22 0632  WBC 13.6* 12.1* 11.5*  HGB 9.7* 10.3* 9.2*  HCT 29.1* 30.1* 27.1*  PLT 92* 91* 144*   BMET Recent Labs    03/01/22 0121 03/01/22 1252 03/03/22 0632  NA 133* 132* 134*  K 5.0 5.0 5.8*  CL 96* 95* 95*  CO2 20* 19* 23  GLUCOSE 113* 117* 97  BUN 60* 70* 62*  CREATININE 4.20* 4.54* 4.29*  CALCIUM 7.6* 7.4* 7.3*      Latest Ref Rng & Units 03/03/2022    6:32 AM 03/01/2022   12:52 PM 03/01/2022    1:21 AM  Hepatic Function  Total Protein 6.5 - 8.1 g/dL 4.9   5.4   Albumin 3.5 - 5.0 g/dL 1.8  1.7  1.8   AST 15 - 41 U/L 155   189   ALT 0 - 44 U/L 31   33   Alk Phosphatase 38 - 126 U/L 1,473   1,758   Total Bilirubin 0.3 - 1.2 mg/dL 74.5   04.9       Assessment / Plan:   Assessment: 1.  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  CKD: Now ESRD on HD 3.  Elevated INR: Now back to normal, last checked 02/27/2022  Plan: 1.  We will continue to trend LFTs during hospitalization, hopefully they will continue to trend down. 2.  Discussed results of liver biopsy with the patient's wife over the phone.  We will sign off, please call us back if we can be of any further assistance.   LOS: 20 days   Unk [place]  03/03/2022, 10:35 AM     Attending Physician Note   I have taken an interval history, reviewed the chart and examined the patient. I performed a substantive portion of this encounter, including complete performance of at least one of the key components, in conjunction with the APP. I agree with the APP's note, impression and recommendations with my edits. My additional impressions and recommendations are as follows.   Elevated LFTs, cholestatic pattern, slowly improving. Bilirubin improvement will likely lag behind other LFTs given CKD on HD.  Liver biopsy reviewed showing change of venous outflow obstruction, congestive hepatopathy, with a secondary pattern of iron deposition. In addition his low iron saturation is c/w a secondary pattern of iron deposition. We discussed these results in detail with his wife by phone. Trend LFTs. GI signing off. Outpatient GI follow up with Dr. Silverio Decamp.  Lucio Edward, MD [facility] See Shea Evans, [facility], for our on call provider

## 2022-03-03 NOTE — Progress Notes (Signed)
Patient arrived to unit in bed. Per floor nurse, patient refused to dialyze in a chair. Heparin not  given due to patient's contraindication to pork-derived products. Provider made aware.

## 2022-03-03 NOTE — Progress Notes (Signed)
Mobility Specialist - Progress Note   03/03/22 1039  Mobility  Activity Ambulated with assistance in room  Level of Assistance Contact guard assist, steadying assist  Assistive Device Front wheel walker  Distance Ambulated (ft) 28 ft  Activity Response Tolerated well  $Mobility charge 1 Mobility    Pre-mobility: 118/49 BP Post-mobility:149/57  BP  Pt was received in bed and agreeable to mobility. Pt required MaxA to stand. Ambulated on 3L O2/min. Pt noticeably shaking during and c/o pain in ankles. Pt was returned to bed with all needs met.  Larey Seat

## 2022-03-03 NOTE — Plan of Care (Signed)
  Problem: Coping: Goal: Level of anxiety will decrease Outcome: Progressing   Problem: Pain Managment: Goal: General experience of comfort will improve Outcome: Progressing   Problem: Safety: Goal: Ability to remain free from injury will improve Outcome: Progressing   

## 2022-03-03 NOTE — Progress Notes (Signed)
BP 87/49 during dialysis. UF turned off to maintain BP parameters. Patient otherwise stable. Will continue to monitor patient's vitals and turn UF on once BP rise.

## 2022-03-03 NOTE — Progress Notes (Signed)
[facility] Progress Note  Subjective: patient seen in room. Sitting up on side of bed w/o assistance.   Vitals:   03/02/22 2249 03/03/22 0244 03/03/22 0644 03/03/22 0757  BP: (!) 115/40 (!) 115/53  114/78  Pulse: 76 77  74  Resp: '18 17  12  '$ Temp: 97.6 F (36.4 C) 97.9 F (36.6 C)  97.9 F (36.6 C)  TempSrc: Axillary Oral  Oral  SpO2:  97%  100%  Weight:   54.6 kg   Height:        Exam: Gen: NAD, cachectic, jaundiced, pleasant CV: RRR Resp: CTA BL Abd: soft, nt/nd Ext: very trace edema b/l LEs Neuro: awake, alert Dialysis access: RIJ TDC, RUE AVG +b/t   NEW ESRD: set up for OP HD at [facility] on MWF 12:15 chair time - last hep B labs: 8/2, non-immune  Summary: CKD V patient f/b Dr Graylon Gunning, recent AVF placement in R arm (the R AVF placed in Feb 2023 failed and on 02/08/22 had a new RUA AVG placed). Pt also recently dx'd w/ L retroperitoneal paraganglioma came to ED w/ progressive fatigue, weakness and SOB. K+ 6.6 + junctional bradycardia. Pt agreed to start dialysis due to life-threatening cardiac instability. CCM placed temp cath and pt was dialyzed. Amio/ diltiazem were held. IR saw him and placed TDC. Pt improved and was CLIP'd to [facility] on MWF. Admit complicated by pseudomonas UTI from 8/5. FTT felt to be due to ganglioma.   IP HD: 3-4h, 2K, heparin 2000 prn, 1.5 L UF ave Hep B labs done 03/01/2022 here    Assessment/ Plan: New ESRD - started HD here 8/3. Accepted to [facility] on MWF schedule. Cont HD MWF here. HD today.  Acute/ chronic hypoxic resp failure - acute phase resolved. Wears home O2 2-3 L [place]. Wt's down and no vol excess.  RUE AVG - placed 7/31. Using [facility] now.  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 Cont calcitriol and calc acetate. CCa improving.  HTN/ vol - BP's low-normal, getting cardizem 180 daily for afib. Pt in NSR now.  Euvolemic on exam. UF 1-1.5 L w/ next HD.  PAF - on cardizem for afib Liver failure with coagulopathy - workup negative.  Per primary and GI. +hemosiderosis. Liver biopsy 8/17 Sepsis due to pseudomonas UTI - resolved, sp course of IV abx. Retroperitoneal paraganglioma - to be seen at [facility] as he was deemed too high risk for surgery here.  DNR - Palliative consulting, pt DNR now but full medical care otherwise. Needs HD in a chair for dc to home or SNF. Pt aware.  Kelly Splinter, MD 03/03/2022, 10:48 AM   Recent Labs  Lab 03/01/22 1252 03/02/22 0625 03/03/22 0632  HGB 9.7* 10.3* 9.2*  ALBUMIN 1.7*  --  1.8*  CALCIUM 7.4*  --  7.3*  PHOS 11.8*  --   --   CREATININE 4.54*  --  4.29*  K 5.0  --  5.8*    No results for input(s): "IRON", "TIBC", "FERRITIN" in the last 168 hours. Inpatient medications:  calcitRIOL  0.25 mcg Oral Q M,W,F   calcium acetate  667 mg Oral TID WC   Chlorhexidine Gluconate Cloth  6 each Topical Q0600   darbepoetin (ARANESP) injection - DIALYSIS  200 mcg Subcutaneous Q Mon-HD   diltiazem  180 mg  Oral Daily   feeding supplement (NEPRO CARB STEADY)  237 mL Oral TID BM   insulin aspart  0-6 Units Subcutaneous TID WC   multivitamin  1 tablet Oral QHS   pantoprazole  40 mg Oral Q0600   sodium chloride flush  3 mL Intravenous Q12H   traZODone  50 mg Oral QHS    sodium chloride Stopped (02/12/22 1243)   fentaNYL, [START ON March 17, 2022] heparin, HYDROmorphone (DILAUDID) injection, lidocaine (PF), midazolam, ondansetron (ZOFRAN) IV, mouth rinse, oxyCODONE, prochlorperazine, senna, sodium chloride flush

## 2022-03-03 NOTE — Progress Notes (Signed)
Received patient in bed to unit. Alert and oriented. Informed consent signed and in chart.   Treatment initiated: 1526 Treatment completed: 1726   Treatment not tolerated. 200 cc bolus given for BP of 83/43. Interpreter assisted conversation. Interpreter # [phone], Meredeth Ide. Patient stated he his having severe back pain and would like to have something for the pain. With interpreter, explained situation with BP and giving pain medication with low BP. Patient asked to terminate treatment with 1 hour remaining. Report give to patient's floor nurse. Patient is alert and oriented.   Access used: Right HD catheter.  Access issues: none   Total UF removed: 400 mL  Medication(s) given: none  Post HD VS: 103/43 78 19 100% on 4L [place]  Post HD weight:  52.6 kg   [facility]

## 2022-03-03 NOTE — Progress Notes (Signed)
Pt's case discussed with CSW yesterday afternoon. Pt is for placement at [facility]. Advised CSW of pt's HD schedule at [facility]) MWF 12:15 chair time. Advised clinic this am that pt may be starting on Friday per CSW note. Will assist as needed.   Melven Sartorius Renal Navigator [phone]

## 2022-03-03 NOTE — Progress Notes (Signed)
Unable to get standing weight on patient at this time.  Patient does not feel like standing up this morning.  This RN attempted to suggest patient get in the chair since breakfast will be around soon.  Patient is not ready to get up at this time.

## 2022-03-03 NOTE — Progress Notes (Signed)
[facility]                                                                              Adam Reid, is a 76 y.o. male, DOB - 13-Dec-1945, ZGY:[id number] Admit date - 03/06/2022    Outpatient Primary MD for the patient is [name], MD  LOS - 20  days  Chief Complaint  Patient presents with   Weakness       Brief summary   76 y.o. male with a history of retroperitoneal paraganglioma, stage V CKD, chronic HFpEF, PAF who presented to the ED 8/2 with weakness, leg swelling. He was found to have junctional bradycardia with hyperkalemia, acidosis, uremia, volume overload, and elevated LFTs. Imaging revealed loculated right pleural effusion. Temporary HD catheter was inserted and HD initiated urgently on 8/3. The hospitalization has been complicated by respiratory failure, liver failure and thrombocytopenia, epistaxis, coffee-ground emesis, and anemia requiring transfusions.  HD started Monday Wednesday Friday, permacath in place.  Right upper extremity AV graft was placed 7/31. 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   Acute on chronic respiratory failure with hypoxia -Currently respiratory status stable, acute phase resolved, at home on 2 to 3 L O2 via [place] -volume management with HD   Acute liver failure with coagulopathy, hyperbilirubinemia - CT scan without biliary obstruction, Dopplers negative, serological work-up including ANA, smooth muscle IgG, CMV, EBV, hepatitis panel is negative. -Underwent liver biopsy on 8/17, results positive for congestive hepatopathy with  hemosiderosis -Paracentesis with bloody fluid, negative for malignancy -Seen by GI, LFTs improving, outpatient follow-up   Sepsis due to Pseudomonas UTI -Treated with cefepime, clinically improved  Acute blood loss anemia superimposed on chronic anemia, iron deficiency and CKD -H&H stable, received 3 units packed RBC, ESA with HD  Coffee-ground emesis with history of GERD, Barrett's esophagus -Resolved, continue PPI, conservative management   Paroxysmal A-fib, junctional bradycardia on arrival -On diltiazem for rate control, not on anticoagulation candidate due to risk of falls   Diabetes mellitus type 2, uncontrolled with hyperglycemia, with CKD -Continue SSI Recent Labs    03/02/22 0607 03/02/22 1141 03/02/22 1610 03/02/22 1956 03/02/22 2146 03/03/22 0607  GLUCAP 115* 174* 157* 126* 140* 90      Generalized debility - very frail and debilitated, accepted to SNF  Pressure Injury Documentation: Pressure injury, sacrum left deep tissue, POA  Severe malnutrition Nutrition Problem: Severe Malnutrition Etiology: chronic illness (CKD now ESRD) Signs/Symptoms: severe muscle depletion, severe fat depletion Interventions: Refer to RD note for recommendations, Liberalize Diet, MVI, Education, Magic cup, Nepro shake  Estimated body mass index is 19.43 kg/m as calculated from the following:   Height as of this encounter: '5\' 6"'$  (1.676 m).   Weight as of this encounter: 54.6 kg.  Code Status: DNR  DVT Prophylaxis:  SCDs Start: 02/11/22 0027   Level of Care: Level of care: Progressive Family Communication: Updated patient   Disposition Plan:      Remains inpatient appropriate: Plan for DC to SNF tomorrow, pending HD today   Procedures:  HD   Consultants:   Nephrology Gastroenterology Palliative medicine Interventional etiology  Antimicrobials:   Anti-infectives (From admission, onward)    Start     Dose/Rate Route Frequency Ordered Stop   02/25/22 1200   ceFAZolin (ANCEF) IVPB 2g/100 mL premix        2 g 200 mL/hr over 30 Minutes Intravenous  Once 02/25/22 1100 02/25/22 1159   02/18/22 1045  vancomycin (VANCOCIN) IVPB 1000 mg/200 mL premix        1,000 mg 200 mL/hr over 60 Minutes Intravenous  Once 02/18/22 0952 02/18/22 1954   02/15/22 0600  ceFAZolin (ANCEF) IVPB 2g/100 mL premix        2 g 200 mL/hr over 30 Minutes Intravenous To Radiology 02/11/22 1423 02/15/22 0708   02/12/2022 2000  ceFEPIme (MAXIPIME) 1 g in sodium chloride 0.9 % 100 mL IVPB        1 g 200 mL/hr over 30 Minutes Intravenous Every 24 hours 03/09/2022 1847 02/18/22 0300   02/12/22 1100  cefTRIAXone (ROCEPHIN) 1 g in sodium chloride 0.9 % 100 mL IVPB  Status:  Discontinued        1 g 200 mL/hr over 30 Minutes Intravenous Every 24 hours 02/12/22 1019 03/02/2022 1847   02/19/2022 2330  piperacillin-tazobactam (ZOSYN) IVPB 3.375 g        3.375 g 100 mL/hr over 30 Minutes Intravenous  Once 03/09/2022 2319 02/11/22 0015          Medications  calcitRIOL  0.25 mcg Oral Q M,W,F   calcium acetate  667 mg Oral TID WC   Chlorhexidine Gluconate Cloth  6 each Topical Q0600   darbepoetin (ARANESP) injection - DIALYSIS  200 mcg Subcutaneous Q Mon-HD   diltiazem  180 mg Oral Daily   feeding supplement (NEPRO CARB STEADY)  237 mL Oral TID BM   insulin aspart  0-6 Units Subcutaneous TID WC   multivitamin  1 tablet Oral QHS   pantoprazole  40 mg Oral Q0600   sodium chloride flush  3 mL Intravenous Q12H   traZODone  50 mg Oral QHS      Subjective:   Adam Reid was seen and examined today.  Very frail and debilitated, sitting at the edge of the bed, eating breakfast, no acute complaints.  HD pending today.  Patient denies dizziness, chest pain, shortness of breath, abdominal pain.  No acute issues overnight  Objective:   Vitals:   03/02/22 2249 03/03/22 0244 03/03/22 0644 03/03/22 0757  BP: (!) 115/40 (!) 115/53  114/78  Pulse: 76 77  74  Resp: '18 17  12  '$ Temp: 97.6 F  (36.4 C) 97.9 F (36.6 C)  97.9 F (36.6 C)  TempSrc: Axillary Oral  Oral  SpO2:  97%  100%  Weight:   54.6 kg   Height:       No intake or output data in the 24 hours ending 03/03/22 1117   Wt Readings from Last 3 Encounters:  03/03/22 54.6 kg  02/08/22 52.2 kg  02/08/22 49.9 kg     Exam General: Alert and oriented x 3, NAD, frail, jaundiced Cardiovascular: S1 S2 auscultated,  RRR Respiratory: Clear to auscultation bilaterally, no wheezing, rales Gastrointestinal: Soft, nontender, nondistended, +  bowel sounds Ext: 1-2+ pedal edema bilaterally Neuro: Strength 5/5 upper and lower extremities bilaterally Psych: Normal affect and demeanor, alert and oriented x3     Data Reviewed:  I have personally reviewed following labs    CBC Lab Results  Component Value Date   WBC 11.5 (H) 03/03/2022   RBC 2.98 (L) 03/03/2022   HGB 9.2 (L) 03/03/2022   HCT 27.1 (L) 03/03/2022   MCV 90.9 03/03/2022   MCH 30.9 03/03/2022   PLT 144 (L) 03/03/2022   MCHC 33.9 03/03/2022   RDW 24.4 (H) 03/03/2022   LYMPHSABS 0.2 (L) 02/27/2022   MONOABS 0.7 02/27/2022   EOSABS 0.0 02/27/2022   BASOSABS 0.1 59/56/3875     Last metabolic panel Lab Results  Component Value Date   NA 134 (L) 03/03/2022   K 5.8 (H) 03/03/2022   CL 95 (L) 03/03/2022   CO2 23 03/03/2022   BUN 62 (H) 03/03/2022   CREATININE 4.29 (H) 03/03/2022   GLUCOSE 97 03/03/2022   GFRNONAA 14 (L) 03/03/2022   GFRAA 21 (L) 06/19/2020   CALCIUM 7.3 (L) 03/03/2022   PHOS 11.8 (H) 03/01/2022   PROT 4.9 (L) 03/03/2022   ALBUMIN 1.8 (L) 03/03/2022   LABGLOB 3.2 06/19/2020   AGRATIO 1.4 06/19/2020   BILITOT 23.3 (HH) 03/03/2022   ALKPHOS 1,473 (H) 03/03/2022   AST 155 (H) 03/03/2022   ALT 31 03/03/2022   ANIONGAP 16 (H) 03/03/2022    CBG (last 3)  Recent Labs    03/02/22 1956 03/02/22 2146 03/03/22 0607  GLUCAP 126* 140* 90      Coagulation Profile: Recent Labs  Lab 02/27/22 0713  INR 1.2          Adam Reid M.D. [facility] 03/03/2022, 11:17 AM  Available via Epic secure chat 7am-7pm After 7 pm, please refer to night coverage provider listed on amion.

## 2022-03-03 NOTE — Progress Notes (Signed)
Patient returned from HD with a cbg of 64 at [place], 4oz of juice given, informed night shift RN and NT to recheck sugar at 1910. Patient resting in bed. Stated pain was 0/10.

## 2022-03-03 NOTE — TOC Progression Note (Signed)
Transition of Care [facility]) - Progression Note    Patient Details  Name: Adam Reid MRN: [id number] Date of Birth: 1946-02-05  Transition of Care [facility]) CM/SW Contact  Reece Agar, [place] Phone Number: 03/03/2022, 2:14 PM  Clinical Narrative:    Pt insurance was approved for CIR but pt spouse wants to continue with SNF at [facility]. CSW contacted [employer] to get new auth, new ref# [id number].    Expected Discharge Plan: [place] Barriers to Discharge: Continued Medical Work up  Expected Discharge Plan and Services Expected Discharge Plan: [place] In-house Referral: Clinical Social Work Discharge Planning Services: NA Post Acute Care Choice: [place] Living arrangements for the past 2 months: Single Family Home                 DME Arranged: N/A DME Agency: NA       HH Arranged: PT, OT Mutual Agency: [place] Date [place]: 02/17/22 Time [place]: 8250 Representative spoke with at [place]: [place] ([place]) Interventions    Readmission Risk Interventions    02/17/2022    2:05 PM 12/15/2021    9:03 AM 11/16/2021    3:15 PM  Readmission Risk Prevention Plan  Transportation Screening Complete Complete Complete  PCP or Specialist Appt within 3-5 Days  Complete Complete  HRI or [place]  Complete Complete  Social Work Consult for [place] Planning/Counseling  Complete Complete  Palliative Care Screening  Not Applicable Not Applicable  Medication Review Press photographer) Complete Complete Complete  PCP or Specialist appointment within 3-5 days of discharge Complete    HRI or [place] Complete    SW Recovery Care/Counseling Consult Complete    [place] Not Applicable

## 2022-03-03 NOTE — Progress Notes (Signed)
Patient ID: Adam Reid, male   DOB: 1945-07-31, 76 y.o.   MRN: [id number]    Progress Note from the Palliative Medicine Team at [facility]   Patient Name: Adam Reid        Date: 03/03/2022 DOB: 04-11-1946  Age: 76 y.o. MRN#: [id number] Attending Physician: Mendel Corning, MD Primary Care Physician: [name], MD Admit Date: 03/09/2022   Medical records reviewed   76 y.o. male with a history of retroperitoneal paraganglioma, stage V CKD, chronic HFpEF, PAF who presented to the ED 8/2 with weakness, leg swelling. He was found to have junctional bradycardia with hyperkalemia, acidosis, uremia, volume overload, and elevated LFTs. Imaging revealed loculated right pleural effusion. Temporary HD catheter was inserted and HD initiated urgently on 8/3. The hospitalization has been complicated by respiratory failure, liver failure and thrombocytopenia, epistaxis, coffee-ground emesis, and anemia requiring transfusions.    Palliative care has been asked to get involved to have ongoing goals of care conversations in the setting of patients declining health state. PMT has worked extensively with this patient and family and past hospitalizations.  Patient and family face treatment option decisions, advanced directive decisions and anticipatory care needs.  Patient's wife called me on the phone with great concern regarding the next steps in his treatment plan.  Decision is between [employer] and [facility]. Discussion had regarding his high risk for decompensation regardless of discharge plan.  This NP assessed patient, he is currently in dialysis.  Patient appears weak and fatigued and jaundiced.    I met with wife at bedside as scheduled at 3:00  Again created space and opportunity for wife to explore thoughts and feelings regarding current medical situation.   This is a very difficult situation for her, she realizes that she cannot care for him at home, she is trying to   I shared  generalized consensus and concern for patient's very poor clinical status and likely associated poor prognosis.  Wife verbalizes an understanding of the seriousness of his current medical situation and the likely associated poor prognosis however at this time she continues with all offered and available medical interventions to prolong life, knowing that this is her husband's and son's wishes.  Education offered on self-care, specific to  persons acting as caregivers for family members.  Education offered today regarding  the importance of continued conversation with her family (pacifically her son Adam Reid who lives in [place] and is her main support person )and their  medical providers regarding overall plan of care and treatment options,  ensuring decisions are within the context of the patients values and GOCs.  Questions and concerns addressed   PMT will continue to support holistically  Plan is to meet tomorrow afternoon at 3:00  Wadie Lessen NP  Palliative Medicine Team Team Phone # [phone] Pager [phone]

## 2022-03-04 ENCOUNTER — Inpatient Hospital Stay (HOSPITAL_COMMUNITY): Payer: Medicare Other

## 2022-03-04 ENCOUNTER — Other Ambulatory Visit: Payer: Self-pay | Admitting: *Deleted

## 2022-03-04 DIAGNOSIS — K761 Chronic passive congestion of liver: Secondary | ICD-10-CM | POA: Diagnosis not present

## 2022-03-04 DIAGNOSIS — Z7189 Other specified counseling: Secondary | ICD-10-CM | POA: Diagnosis not present

## 2022-03-04 DIAGNOSIS — Z515 Encounter for palliative care: Secondary | ICD-10-CM | POA: Diagnosis not present

## 2022-03-04 DIAGNOSIS — E875 Hyperkalemia: Secondary | ICD-10-CM | POA: Diagnosis not present

## 2022-03-04 DIAGNOSIS — N186 End stage renal disease: Secondary | ICD-10-CM | POA: Diagnosis not present

## 2022-03-04 DIAGNOSIS — I5032 Chronic diastolic (congestive) heart failure: Secondary | ICD-10-CM | POA: Diagnosis not present

## 2022-03-04 LAB — CBC
HCT: 29.3 % — ABNORMAL LOW (ref 39.0–52.0)
Hemoglobin: 10.1 g/dL — ABNORMAL LOW (ref 13.0–17.0)
MCH: 31.3 pg (ref 26.0–34.0)
MCHC: 34.5 g/dL (ref 30.0–36.0)
MCV: 90.7 fL (ref 80.0–100.0)
Platelets: 160 10*3/uL (ref 150–400)
RBC: 3.23 MIL/uL — ABNORMAL LOW (ref 4.22–5.81)
RDW: 24.5 % — ABNORMAL HIGH (ref 11.5–15.5)
WBC: 3 10*3/uL — ABNORMAL LOW (ref 4.0–10.5)
nRBC: 3.4 % — ABNORMAL HIGH (ref 0.0–0.2)

## 2022-03-04 LAB — GLUCOSE, CAPILLARY
Glucose-Capillary: 64 mg/dL — ABNORMAL LOW (ref 70–99)
Glucose-Capillary: 106 mg/dL — ABNORMAL HIGH (ref 70–99)
Glucose-Capillary: 34 mg/dL — CL (ref 70–99)

## 2022-03-04 LAB — MISC LABCORP TEST (SEND OUT): Labcorp test code: 9985

## 2022-03-04 MED ORDER — GLYCOPYRROLATE 0.2 MG/ML IJ SOLN: 0.2000 mg | INTRAMUSCULAR | Status: DC | PRN

## 2022-03-04 MED ORDER — DEXTROSE 50 % IV SOLN
25.0000 g | INTRAVENOUS | Status: AC
  Administered 2022-03-04: 25 g via INTRAVENOUS

## 2022-03-04 MED ORDER — GLYCOPYRROLATE 1 MG PO TABS: 1.0000 mg | ORAL_TABLET | ORAL | Status: DC | PRN

## 2022-03-04 MED ORDER — MIDODRINE HCL 5 MG PO TABS
10.0000 mg | ORAL_TABLET | Freq: Three times a day (TID) | ORAL | Status: DC
  Administered 2022-03-04: 10 mg via ORAL
  Filled 2022-03-04: qty 2

## 2022-03-04 MED ORDER — HYDROMORPHONE HCL 1 MG/ML IJ SOLN
0.5000 mg | INTRAMUSCULAR | Status: DC | PRN
  Administered 2022-03-04: 0.5 mg via INTRAVENOUS
  Filled 2022-03-04: qty 0.5

## 2022-03-04 MED ORDER — LACTULOSE 10 GM/15ML PO SOLN
20.0000 g | Freq: Once | ORAL | Status: DC
Start: 2022-03-04 — End: 2022-03-04

## 2022-03-04 MED ORDER — POLYVINYL ALCOHOL 1.4 % OP SOLN: 1.0000 [drp] | Freq: Four times a day (QID) | OPHTHALMIC | Status: DC | PRN

## 2022-03-04 MED ORDER — ACETAMINOPHEN 650 MG RE SUPP: 650.0000 mg | Freq: Four times a day (QID) | RECTAL | Status: DC | PRN

## 2022-03-04 MED ORDER — ACETAMINOPHEN 325 MG PO TABS: 650.0000 mg | ORAL_TABLET | Freq: Four times a day (QID) | ORAL | Status: DC | PRN

## 2022-03-04 MED ORDER — DEXTROSE 50 % IV SOLN
INTRAVENOUS | Status: AC
  Filled 2022-03-04: qty 50

## 2022-03-04 MED ORDER — FENTANYL CITRATE PF 50 MCG/ML IJ SOSY
12.5000 ug | PREFILLED_SYRINGE | INTRAMUSCULAR | Status: DC | PRN
  Administered 2022-03-04: 12.5 ug via INTRAVENOUS
  Filled 2022-03-04: qty 1

## 2022-03-04 MED ORDER — BIOTENE DRY MOUTH MT LIQD
15.0000 mL | OROMUCOSAL | Status: DC | PRN
Start: 2022-03-04 — End: 2022-03-05

## 2022-03-04 MED ORDER — SENNA 8.6 MG PO TABS: 2.0000 | ORAL_TABLET | Freq: Every day | ORAL | Status: DC

## 2022-03-04 MED ORDER — LORAZEPAM 2 MG/ML IJ SOLN: 0.5000 mg | INTRAMUSCULAR | Status: DC | PRN

## 2022-03-04 MED ORDER — HYDROMORPHONE HCL 1 MG/ML IJ SOLN: 0.5000 mg | INTRAMUSCULAR | Status: DC

## 2022-03-04 MED ORDER — BISACODYL 10 MG RE SUPP: 10.0000 mg | Freq: Every day | RECTAL | Status: DC | PRN

## 2022-03-04 MED ORDER — DILTIAZEM HCL ER COATED BEADS 180 MG PO CP24: ORAL_CAPSULE | ORAL | 2 refills | Status: AC

## 2022-03-05 LAB — VITAMIN C: Vitamin C: 0.2 mg/dL — ABNORMAL LOW (ref 0.4–2.0)

## 2022-03-05 LAB — COPPER, SERUM: Copper: 98 ug/dL (ref 69–132)

## 2022-03-05 LAB — ZINC: Zinc: 51 ug/dL (ref 44–115)

## 2022-03-06 LAB — VITAMIN A: Vitamin A (Retinoic Acid): 10.4 ug/dL — ABNORMAL LOW (ref 22.0–69.5)

## 2022-03-09 ENCOUNTER — Telehealth: Payer: Self-pay | Admitting: Family Medicine

## 2022-03-09 NOTE — Telephone Encounter (Signed)
Called spouse [name] and expressed condolences on Arben's passing. Has support system in place. She thanked me for his care, let her know it was an honor to provide his care. Denies any needs at this time.   Also called son Mike Craze to express condolences. He also thanked me for the care of his father. No needs identified at this time.

## 2022-03-12 NOTE — Progress Notes (Signed)
PT Cancellation Note  Patient Details Name: Adam Reid MRN: [id number] DOB: 02/15/1946   Cancelled Treatment:    Reason Eval/Treat Not Completed: Other (comment), pt transitioned to comfort care, will hold PT today, will continue to follow and continue POC as able.   Adam Reid March 23, 2022, 2:06 PM

## 2022-03-12 NOTE — Progress Notes (Signed)
[facility] Progress Note  Subjective: seen in room, moaning as if in pain, eyes closed, cannot answer questions. BP's 80s-90s.   Vitals:   03/28/22 0300 2022-03-28 0800 28-Mar-2022 1200 03/28/2022 1404  BP: (!) 93/44 (!) 103/50 (!) 84/41 (!) 73/37  Pulse: 88 92 94 (!) 53  Resp: 17 20 (!) 24 19  Temp: 98.4 F (36.9 C)  98.6 F (37 C) 98.4 F (36.9 C)  TempSrc: Axillary  Oral Oral  SpO2:  99% 97% 93%  Weight: 52 kg     Height:        Exam: Gen: looks in pain, eyes closed, hunched over in bed moaning CV: RRR Resp: CTA BL Abd: soft, nt/nd Ext: very trace edema b/l LEs Neuro: awake, alert Dialysis access: RIJ TDC, RUE AVG +b/t   NEW ESRD: set up for OP HD at [facility] on MWF 12:15 chair time - last hep B labs: 8/2, non-immune  IP HD: 3-4h, 2K, heparin 2000 prn, 1.5 L UF ave Hep B labs done 02/24/2022 here    Assessment/ Plan: EOL - pt w/ acute decline this am and is DNR. Palliative care and wife will to discuss this morning about possible comfort care.  New ESRD - last HD yesterday Acute/ chronic hypoxic resp failure - acute phase resolved RUE AVG - placed 7/31 PAF - on cardizem for afib Liver failure with coagulopathy - workup negative.  Per primary and GI. +hemosiderosis. DNR   Kelly Splinter, MD Mar 28, 2022, 3:29 PM   Recent Labs  Lab 03/01/22 1252 03/02/22 7494 03/03/22 4967 Mar 28, 2022 0052  HGB 9.7*   < > 9.2* 10.1*  ALBUMIN 1.7*  --  1.8*  --   CALCIUM 7.4*  --  7.3*  --   PHOS 11.8*  --   --   --   CREATININE 4.54*  --  4.29*  --   K 5.0  --  5.8*  --    < > = values in this interval not displayed.    No results for input(s): "IRON", "TIBC", "FERRITIN" in the last 168 hours. Inpatient medications:  darbepoetin (ARANESP) injection - DIALYSIS  200 mcg Subcutaneous Q Mon-HD     acetaminophen **OR** acetaminophen, antiseptic oral rinse, bisacodyl, glycopyrrolate **OR** glycopyrrolate **OR** glycopyrrolate, HYDROmorphone (DILAUDID) injection,  LORazepam, ondansetron (ZOFRAN) IV, polyvinyl alcohol

## 2022-03-12 NOTE — Progress Notes (Signed)
Late Entry Son came to see deceased father,around 8 pm  with the mother.Body transfer to morgue.

## 2022-03-12 NOTE — Progress Notes (Signed)
Per Tacey Ruiz NP do not give anything except pain meds at this time.

## 2022-03-12 NOTE — Progress Notes (Signed)
OT Cancellation Note  Patient Details Name: Adam Reid MRN: [id number] DOB: 1945-11-25   Cancelled Treatment:    Reason Eval/Treat Not Completed: Medical issues which prohibited therapy- RN reports pt hypotensive and asking OT to hold today. Will follow and see as able.   [place] Office [phone]   Delight Stare 2022-03-15, 10:16 AM

## 2022-03-12 NOTE — Progress Notes (Signed)
Pt DNR, no pulse, no respirations, pt is unresponsive, no heart sounds or breath sounds heard by this nurse or June Leap, Camera operator. Wife at bedside during time death, Dr. Tana Coast notified, Time of death is 5:15 p.m. on 03-25-2022, Sharyne Richters on [place] notified, morgue notified. Post- mortem care given to patient. Family at beside at this time.

## 2022-03-12 NOTE — Death Summary Note (Addendum)
DEATH SUMMARY   Patient Details  Name: Adam Reid MRN: [id number] DOB: [birth year] [id number], Adam Partridge, MD Admission/Discharge Information   Admit Date:  02/22/22  Date of Death: Date of Death: 03-16-2022  Time of Death: Time of Death: 1713-12-04  Length of Stay: December 12, 2022   Principle Cause of death: End stage renal disease   Hospital Diagnoses:      ESRD, started on dialysis [facility])   Chronic diastolic heart failure (HCC)   Hypertension   PAF (paroxysmal atrial fibrillation) (HCC)   Acute Liver failure with coagulopathy, hyperbilirubinemia (HCC)   Paraganglioma (HCC)   Protein-calorie malnutrition, severe (HCC)   Sepsis due to pseudomonas UTI (HCC)   Generalized debility    Chronic passive hepatic congestion   Hospital Course: 76 y.o. male with a history of retroperitoneal paraganglioma, stage V CKD, chronic HFpEF, PAF who presented to the ED 8/2 with weakness, leg swelling. He was found to have junctional bradycardia with hyperkalemia, acidosis, uremia, volume overload, and elevated LFTs. Imaging revealed loculated right pleural effusion. Temporary HD catheter was inserted and HD initiated urgently on 8/3. The hospitalization has been complicated by respiratory failure, liver failure and thrombocytopenia, epistaxis, coffee-ground emesis, and anemia requiring transfusions.  HD started Monday Wednesday Friday, permacath placed  Right upper extremity AV graft was placed 7/31. 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  Assessment and Plan:  CKD stage IV, progressed to ESRD, started on new dialysis [facility]) -HD started since 8/3, currently on MWF schedule -Catheter placed 8/7, AV graft 7/31, patient accepted to outpatient dialysis center -patient noted to be hypotensive, encephalopathic -Palliative medicine had been following, discussed with patient's family at length, agreeable with comfort care   Acute on chronic respiratory  failure with hypoxia -comfort care status   Acute liver failure with coagulopathy, hyperbilirubinemia - CT scan without biliary obstruction, Dopplers negative, serological work-up including ANA, smooth muscle IgG, CMV, EBV, hepatitis panel is negative. 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 -Paracentesis with bloody fluid, negative for malignancy -Patient was seen by GI this admission -Appeared to have abdominal pain, abdominal x-ray showed ileus and moderate stool burden, no perforation or pneumoperitoneum   Sepsis due to Pseudomonas UTI -Treated with cefepime   Acute blood loss anemia superimposed on chronic anemia, iron deficiency and CKD -received 3 units packed RBC, ESA with HD   Coffee-ground emesis with history of GERD, Barrett's esophagus -Resolved, was placed on PPI   Paroxysmal A-fib, junctional bradycardia on arrival Hypotensive -Patient was placed on midodrine, subsequently placed on comfort care status   Diabetes mellitus type 2, uncontrolled with hyperglycemia, with CKD -On sliding scale insulin, while inpatient -Placed on comfort care status     Generalized debility - very frail and debilitated   Pressure Injury Documentation: Pressure injury, sacrum left deep tissue, POA   Severe malnutrition Nutrition Problem: Severe Malnutrition Etiology: chronic illness (CKD now ESRD) Signs/Symptoms: severe muscle depletion, severe fat depletion Interventions: Refer to RD note for recommendations, Liberalize Diet, MVI, Education, Magic cup, Nepro shake   Estimated body mass index is 18.5 kg/m as calculated from the following:   Height as of this encounter: 5\' 6"  (1.676 m).   Weight as of this encounter: 52 kg.   Patient passed on 03/16/2022 at 5:15PM       Procedures: HD  Consultations:    Nephrology Gastroenterology Palliative medicine Interventional radiology  The results of significant diagnostics  from this hospitalization (including imaging, microbiology,  ancillary and laboratory) are listed below for reference.   Significant Diagnostic Studies: DG Abd 2 Views  Result Date: 03-17-2022 CLINICAL DATA:  76 year old male with abdominal pain and shortness of breath. Retroperitoneal paraganglioma. EXAM: ABDOMEN - 2 VIEW COMPARISON:  CT Abdomen and Pelvis 02/11/2022. FINDINGS: Supine and left-side-down lateral decubitus views of the abdomen. Abnormal lung bases where pleural effusion and consolidation was demonstrated by CT earlier this month. No pneumoperitoneum identified. Bowel-gas pattern appears similar to the CT scout view earlier this month with abundant retained stool throughout the colon. Suspect gaseous distension of the stomach with an air-fluid level in the midline upper abdomen. No other dilated bowel loops. Poor visualization of abdominal and pelvic visceral contours with a gray hazy opacity throughout the abdomen now. There was some ascites recently on 02/25/2022 which was sampled with ultrasound guidance. Calcified atherosclerosis especially in the pelvis. No acute osseous abnormality identified. IMPRESSION: 1. Air-fluid level suspected in the stomach and abundant retained stool throughout the colon, but Non obstructed bowel gas pattern. No pneumoperitoneum identified. 2. Pearline Cables hazy opacity in the abdomen might relate to cachectic appearance on the CT earlier this month, but consider on going Ascites. 3. Ongoing abnormality at the lung bases where pleural effusions and collapse or consolidation were demonstrated earlier this month. Electronically Signed   By: Genevie Ann M.D.   On: 03-17-2022 09:50   US BIOPSY (LIVER)  Result Date: 02/25/2022 INDICATION: Elevated liver enzymes EXAM: Ultrasound-guided random liver biopsy MEDICATIONS: None. ANESTHESIA/SEDATION: Moderate (conscious) sedation was employed during this procedure. A total of Versed 1 mg and Fentanyl 25 mcg was administered  intravenously by the radiology nurse. Total intra-service moderate Sedation Time: 17 minutes. The patient's level of consciousness and vital signs were monitored continuously by radiology nursing throughout the procedure under my direct supervision. COMPLICATIONS: None immediate. PROCEDURE: Informed written consent was obtained from the patient after a thorough discussion of the procedural risks, benefits and alternatives. All questions were addressed. Maximal Sterile Barrier Technique was utilized including caps, mask, sterile gowns, sterile gloves, sterile drape, hand hygiene and skin antiseptic. A timeout was performed prior to the initiation of the procedure. Patient position supine on the ultrasound table. Epigastric skin prepped and draped in usual sterile fashion. Following local lidocaine administration, 17 gauge introducer needle was advanced into the left hepatic lobe, and three 18 gauge cores were obtained utilizing continuous ultrasound guidance. Gel-Foam slurry was not utilized given patient's sensitivity to pork products. Samples were sent to pathology in formalin. Needle removed and hemostasis achieved with 10 minutes of manual compression. Post procedure ultrasound images showed no evidence of significant hemorrhage. IMPRESSION: Ultrasound-guided random liver biopsy. Electronically Signed   By: Miachel Roux M.D.   On: 02/25/2022 11:48   US Paracentesis  Result Date: 02/25/2022 INDICATION: 76 year old gentleman with abdominal ascites presents to IR for random liver biopsy. Paracentesis performed in order to decrease risk of hemorrhage from biopsy. EXAM: ULTRASOUND GUIDED  PARACENTESIS MEDICATIONS: None. COMPLICATIONS: None immediate. PROCEDURE: Informed written consent was obtained from the patient after a discussion of the risks, benefits and alternatives to treatment. A timeout was performed prior to the initiation of the procedure. Initial ultrasound scanning demonstrates a small amount of  ascites within the right lower abdominal quadrant. The right lower abdomen was prepped and draped in the usual sterile fashion. 1% lidocaine was used for local anesthesia. Following this, a 6 Fr Safe-T-Centesis catheter was introduced. An ultrasound image was saved for documentation purposes. The paracentesis was performed. The catheter  was removed and a dressing was applied. The patient tolerated the procedure well without immediate post procedural complication. FINDINGS: A total of approximately 150 mL of blood tinged fluid was removed. Samples were sent to the laboratory as requested by the clinical team. IMPRESSION: Successful ultrasound-guided paracentesis yielding 150 mL of peritoneal fluid. Electronically Signed   By: Miachel Roux M.D.   On: 02/25/2022 11:46   MR ABDOMEN MRCP WO CONTRAST  Result Date: 02/21/2022 CLINICAL DATA:  76 year old male with history of jaundice. History of retroperitoneal paraganglioma EXAM: MRI ABDOMEN WITHOUT CONTRAST  (INCLUDING MRCP) TECHNIQUE: Multiplanar multisequence MR imaging of the abdomen was performed. Heavily T2-weighted images of the biliary and pancreatic ducts were obtained, and three-dimensional MRCP images were rendered by post processing. COMPARISON:  No prior abdominal MRI. CT of the abdomen and pelvis 02/11/2022. FINDINGS: Comment: Today's study is limited for detection and characterization of visceral and/or vascular lesions by lack of IV gadolinium. Considerable patient motion also limits portions of today's examination. Lower chest: Large right and small left pleural effusions. Near complete atelectasis of the visualized portions of the right lung. Dependent signal intensity in the left lower lobe is also likely to reflect subsegmental atelectasis, but poorly evaluated on today's magnetic resonance examination. Cardiomegaly. Hepatobiliary: There is diffuse loss of signal intensity throughout the hepatic parenchyma on in phase dual echo images, as well as  diffuse low signal intensity throughout the hepatic parenchyma on T2 weighted images, indicative of severe hepatic iron deposition. No discrete cystic or solid hepatic lesions are confidently identified on today's noncontrast examination. No intra or extrahepatic biliary ductal dilatation noted. MRCP images are essentially nondiagnostic, however, T2 weighted images demonstrate the common bile duct to be 3 mm in the porta hepatis. Gallbladder is unremarkable in appearance. Pancreas: No pancreatic mass. No pancreatic ductal dilatation. No pancreatic or peripancreatic fluid collections or inflammatory changes. Spleen: Diffuse loss of signal intensity throughout the spleen during in phase dual echo images, as well as diffuse low T2 signal intensity in the spleen, indicative of severe splenic iron deposition. Adrenals/Urinary Tract: Multiple renal lesions bilaterally varying in degree of signal intensity and complexity, ranging from T1 hypointense and T2 hyperintense to T1 hyperintense and T2 hypointense, incompletely characterized on today's noncontrast CT examination, but likely a combination of simple and proteinaceous/hemorrhagic cysts. The exception to this is a complex lesion in the upper pole of the right kidney (axial image 19 of series 4) which measures 2.7 cm in diameter which is T1 hyperintense and has T2 hyperintensity non dependently with a dependent nodular appearing area of T2 hypointensity measuring 2.6 x 1.3 cm (axial image 19 of series 4). No hydroureteronephrosis in the visualized portions of the abdomen. Bilateral adrenal glands are unremarkable in appearance. Stomach/Bowel: Visualized portions are unremarkable. Vascular/Lymphatic: No aneurysm identified in the visualized abdominal vasculature. No definite lymphadenopathy confidently identified in the abdomen on today's noncontrast motion limited examination. Other: Large left-sided retroperitoneal mass again noted measuring approximately 6.6 x 5.0 cm  on today's study (axial image 25 of series 4) where there is a central area of low T1 and high T2 signal intensity, which may represent some internal necrosis. Trace volume of ascites. Musculoskeletal: No aggressive appearing osseous lesions are noted in the visualized portions of the skeleton. IMPRESSION: 1. Study is exceedingly limited by lack of IV gadolinium and extensive patient motion. 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 Specifically, no imaging findings to suggest biliary tract obstruction at this time. 2. Large left-sided retroperitoneal mass, similar to prior CT examinations, poorly demonstrated on today's noncontrast study, but compatible with reported paraganglioma. 3. Large right and small left pleural effusions with areas of presumed atelectasis in the lung bases bilaterally, as above. 4. Multiple renal lesions of varying degrees of complexity bilaterally, including a truly indeterminate lesion in the upper pole of the right kidney which measures 2.7 cm with a dependent nodular appearing solid component. Repeat abdominal MRI with and without IV gadolinium should be considered to provide definitive characterization and exclude neoplasm. Electronically Signed   By: Vinnie Langton M.D.   On: 02/21/2022 05:30   IR Fluoro Guide CV Line Right  Result Date: 02/18/2022 INDICATION: 76 year old male with recent hemodialysis catheter placement, with report of nonfunctional catheter last night. He presents for exchange/troubleshooting EXAM: IMAGE GUIDED EXCHANGE OF TUNNELED HEMODIALYSIS CATHETER MEDICATIONS: None ANESTHESIA/SEDATION: None FLUOROSCOPY: Radiation Exposure Index (as provided by the fluoroscopic device): 1 mGy Kerma COMPLICATIONS: None PROCEDURE: Informed written consent was obtained from the patient with Farsi interpretation after a discussion of the  risks, benefits, and alternatives to treatment. Questions regarding the procedure were encouraged and answered. The right neck and chest, including the endo Ling catheter, were prepped with chlorhexidine in a sterile fashion, and a sterile drape was applied covering the operative field. Maximum barrier sterile technique with sterile gowns and gloves were used for the procedure. A timeout was performed prior to the initiation of the procedure. 1% lidocaine was used for local anesthesia. The red and blue port were both aspirated to clear the heparin. A stiff Glidewire was advanced to the IVC. Catheter was removed from the tract. A new 19 cm tip to cuff catheter was placed on the wire, with the tip at the superior cavoatrial junction, slightly more centrally than the prior. Vigorous aspiration of the catheter ports confirmed adequate function. Final catheter positioning was confirmed and documented with a spot radiographic image. The catheter aspirates and flushes normally. The catheter was flushed with appropriate sodium citrate dwell. The catheter exit site was secured with a 0-Prolene retention suture. Dressings were applied. The patient tolerated the procedure well without immediate post procedural complication. IMPRESSION: Status post routine exchange of right IJ tunneled hemodialysis catheter. Signed, Dulcy Fanny. Nadene Rubins, RPVI [facility] Radiology Electronically Signed   By: Corrie Mckusick D.O.   On: 02/18/2022 10:28   VAS [place] LOWER EXTREMITY VENOUS (DVT)  Result Date: 02/16/2022  Lower Venous DVT Study Patient Name:  LEANARD DIMAIO  Date of Exam:   02/16/2022 Medical Rec #: [id number]         Accession #:    [id number] Date of Birth: 08/09/1945         Patient Gender: M Patient Age:   75 years Exam Location:  [facility] Procedure:      VAS [place] LOWER EXTREMITY VENOUS (DVT) Referring Phys: Vance Gather  --------------------------------------------------------------------------------  Indications: Edema.  Comparison Study: no prior Performing Technologist: Archie Patten RVS  Examination Guidelines: A complete evaluation includes B-mode imaging, spectral Doppler, color Doppler, and power Doppler as needed of all accessible portions of each vessel. Bilateral testing is considered an integral part of a complete examination. Limited examinations for reoccurring indications may be performed as noted. The reflux portion of the exam is performed with the patient in reverse Trendelenburg.  +---------+---------------+---------+-----------+----------+--------------+ RIGHT    CompressibilityPhasicitySpontaneityPropertiesThrombus Aging +---------+---------------+---------+-----------+----------+--------------+  CFV      Full           Yes      Yes                                 +---------+---------------+---------+-----------+----------+--------------+ SFJ      Full                                                        +---------+---------------+---------+-----------+----------+--------------+ FV Prox  Full                                                        +---------+---------------+---------+-----------+----------+--------------+ FV Mid   Full                                                        +---------+---------------+---------+-----------+----------+--------------+ FV DistalFull                                                        +---------+---------------+---------+-----------+----------+--------------+ PFV      Full                                                        +---------+---------------+---------+-----------+----------+--------------+ POP      Full           Yes      Yes                                 +---------+---------------+---------+-----------+----------+--------------+ PTV      Full                                                         +---------+---------------+---------+-----------+----------+--------------+ PERO     Full                                                        +---------+---------------+---------+-----------+----------+--------------+   +---------+---------------+---------+-----------+----------+--------------+ LEFT     CompressibilityPhasicitySpontaneityPropertiesThrombus Aging +---------+---------------+---------+-----------+----------+--------------+ CFV      Full           Yes      Yes                                 +---------+---------------+---------+-----------+----------+--------------+  SFJ      Full                                                        +---------+---------------+---------+-----------+----------+--------------+ FV Prox  Full                                                        +---------+---------------+---------+-----------+----------+--------------+ FV Mid   Full                                                        +---------+---------------+---------+-----------+----------+--------------+ FV DistalFull                                                        +---------+---------------+---------+-----------+----------+--------------+ PFV      Full                                                        +---------+---------------+---------+-----------+----------+--------------+ POP      Full           Yes      Yes                                 +---------+---------------+---------+-----------+----------+--------------+ PTV      Full                                                        +---------+---------------+---------+-----------+----------+--------------+ PERO     Full                                                        +---------+---------------+---------+-----------+----------+--------------+     Summary: BILATERAL: - No evidence of deep vein thrombosis seen in the lower extremities, bilaterally. -No  evidence of popliteal cyst, bilaterally.   *See table(s) above for measurements and observations. Electronically signed by [name] MD on 02/16/2022 at 7:18:05 PM.    Final    DG CHEST PORT 1 VIEW  Result Date: 02/16/2022 CLINICAL DATA:  Weakness.  Acute respiratory failure with hypoxia. EXAM: PORTABLE CHEST 1 VIEW COMPARISON:  Chest radiograph 02/11/2022. FINDINGS: Enlarged cardiac silhouette, similar. Similar bibasilar opacities. Similar moderate bilateral pleural effusions. No visible pneumothorax. Similar position of a right IJ approach central venous catheter with the tip projecting at  the superior cavoatrial junction. IMPRESSION: Similar cardiomegaly, moderate bilateral pleural effusions, and overlying bibasilar opacities. Findings better characterized on recent CT chest. Electronically Signed   By: Margaretha Sheffield M.D.   On: 02/16/2022 08:15   IR Fluoro Guide CV Line Right  Result Date: 02/15/2022 INDICATION: 76 year old male presents for tunneled hemodialysis catheter placement EXAM: IMAGE GUIDED TUNNELED HEMODIALYSIS CATHETER. MEDICATIONS: 2 G ANCEF. The antibiotic was given in an appropriate time interval prior to skin puncture. ANESTHESIA/SEDATION: Moderate (conscious) sedation was employed during this procedure. A total of Versed 0.5 mg and Fentanyl 50 mcg was administered intravenously by the radiology nurse. Total intra-service moderate Sedation Time: 25 minutes. The patient's level of consciousness and vital signs were monitored continuously by radiology nursing throughout the procedure under my direct supervision. FLUOROSCOPY: Radiation Exposure Index (as provided by the fluoroscopic device): 1 mGy Kerma COMPLICATIONS: None PROCEDURE: Informed written consent was obtained from the patient after a discussion of the risks, benefits, and alternatives to treatment. Questions regarding the procedure were encouraged and answered. The right neck and chest were prepped with chlorhexidine in  a sterile fashion, and a sterile drape was applied covering the operative field. Maximum barrier sterile technique with sterile gowns and gloves were used for the procedure. A timeout was performed prior to the initiation of the procedure. Ultrasound survey was performed. The right internal jugular vein was confirmed to be patent, with images stored and sent to PACS. Micropuncture kit was utilized to access the right internal jugular vein under direct, real-time ultrasound guidance after the overlying soft tissues were anesthetized with 1% lidocaine with epinephrine. Stab incision was made with 11 blade scalpel. Microwire was passed centrally. The microwire was then marked to measure appropriate internal catheter length. External tunneled length was estimated. A total tip to cuff length of 19 cm was selected. 035 guidewire was advanced to the level of the IVC. Skin and subcutaneous tissues of chest wall below the clavicle were generously infiltrated with 1% lidocaine for local anesthesia. A small stab incision was made with 11 blade scalpel. The selected hemodialysis catheter was tunneled in a retrograde fashion from the anterior chest wall to the venotomy incision. Serial dilation was performed and then a peel-away sheath was placed. The catheter was then placed through the peel-away sheath with tips ultimately positioned within the superior aspect of the right atrium. Final catheter positioning was confirmed and documented with a spot radiographic image. The catheter aspirates and flushes normally. The catheter was flushed with appropriate volume heparin dwells. The catheter exit site was secured with a 0-Prolene retention suture. The venotomy incision was closed Derma bond and sterile dressing. Dressings were applied at the chest wall. Patient tolerated the procedure well and remained hemodynamically stable throughout. No complications were encountered and no significant blood loss encountered. IMPRESSION: Status  post image guided right IJ tunneled hemodialysis catheter. Signed, Dulcy Fanny. Nadene Rubins, RPVI [facility] Radiology Electronically Signed   By: Corrie Mckusick D.O.   On: 02/15/2022 13:06   IR US Guide Vasc Access Right  Result Date: 02/15/2022 INDICATION: 76 year old male presents for tunneled hemodialysis catheter placement EXAM: IMAGE GUIDED TUNNELED HEMODIALYSIS CATHETER. MEDICATIONS: 2 G ANCEF. The antibiotic was given in an appropriate time interval prior to skin puncture. ANESTHESIA/SEDATION: Moderate (conscious) sedation was employed during this procedure. A total of Versed 0.5 mg and Fentanyl 50 mcg was administered intravenously by the radiology nurse. Total intra-service moderate Sedation Time: 25 minutes. The patient's level of consciousness  and vital signs were monitored continuously by radiology nursing throughout the procedure under my direct supervision. FLUOROSCOPY: Radiation Exposure Index (as provided by the fluoroscopic device): 1 mGy Kerma COMPLICATIONS: None PROCEDURE: Informed written consent was obtained from the patient after a discussion of the risks, benefits, and alternatives to treatment. Questions regarding the procedure were encouraged and answered. The right neck and chest were prepped with chlorhexidine in a sterile fashion, and a sterile drape was applied covering the operative field. Maximum barrier sterile technique with sterile gowns and gloves were used for the procedure. A timeout was performed prior to the initiation of the procedure. Ultrasound survey was performed. The right internal jugular vein was confirmed to be patent, with images stored and sent to PACS. Micropuncture kit was utilized to access the right internal jugular vein under direct, real-time ultrasound guidance after the overlying soft tissues were anesthetized with 1% lidocaine with epinephrine. Stab incision was made with 11 blade scalpel. Microwire was  passed centrally. The microwire was then marked to measure appropriate internal catheter length. External tunneled length was estimated. A total tip to cuff length of 19 cm was selected. 035 guidewire was advanced to the level of the IVC. Skin and subcutaneous tissues of chest wall below the clavicle were generously infiltrated with 1% lidocaine for local anesthesia. A small stab incision was made with 11 blade scalpel. The selected hemodialysis catheter was tunneled in a retrograde fashion from the anterior chest wall to the venotomy incision. Serial dilation was performed and then a peel-away sheath was placed. The catheter was then placed through the peel-away sheath with tips ultimately positioned within the superior aspect of the right atrium. Final catheter positioning was confirmed and documented with a spot radiographic image. The catheter aspirates and flushes normally. The catheter was flushed with appropriate volume heparin dwells. The catheter exit site was secured with a 0-Prolene retention suture. The venotomy incision was closed Derma bond and sterile dressing. Dressings were applied at the chest wall. Patient tolerated the procedure well and remained hemodynamically stable throughout. No complications were encountered and no significant blood loss encountered. IMPRESSION: Status post image guided right IJ tunneled hemodialysis catheter. Signed, Dulcy Fanny. Nadene Rubins, RPVI [facility] Radiology Electronically Signed   By: Corrie Mckusick D.O.   On: 02/15/2022 13:06   US LIVER DOPPLER  Result Date: 02/09/2022 CLINICAL DATA:  76 year old male with history of liver failure. EXAM: DUPLEX ULTRASOUND OF LIVER TECHNIQUE: Color and duplex Doppler ultrasound was performed to evaluate the hepatic in-flow and out-flow vessels. COMPARISON:  CT abdomen pelvis from 02/11/2022 FINDINGS: Liver: Normal parenchymal echogenicity. Normal hepatic contour without  nodularity. No focal lesion, mass or intrahepatic biliary ductal dilatation. Main Portal Vein size: 1.5 cm Portal Vein Velocities Main Prox:  41 cm/sec Main Mid: 27 cm/sec Main Dist:  38 cm/sec Right: 63 cm/sec Left: 21 cm/sec All visualized portal venous flow is antegrade/hepatopetal. Hepatic Vein Velocities Right:  25 cm/sec Middle:  41 cm/sec Left:  38 cm/sec IVC: Present and patent with normal respiratory phasicity. Hepatic Artery Velocity:  241 cm/sec Splenic Vein Velocity:  43 cm/sec Spleen: 3.8 cm x 7.6 cm x 2.8 cm with a total volume of 42 cm^3 (411 cm^3 is upper limit normal) Portal Vein Occlusion/Thrombus: No Splenic Vein Occlusion/Thrombus: No Ascites: Trace Varices: None Right greater than left bilateral pleural effusions are noted. IMPRESSION: 1. Patent hepatic vasculature with normal direction of flow. 2. Normal sonographic appearance of the liver. 3. Trace ascites.  4. Right greater than left bilateral pleural effusions. Ruthann Cancer, MD [facility] Radiology Electronically Signed   By: Ruthann Cancer M.D.   On: 02/26/2022 11:01   ECHOCARDIOGRAM LIMITED  Result Date: 02/11/2022    ECHOCARDIOGRAM LIMITED REPORT   Patient Name:   NIKHOLAS GEFFRE Date of Exam: 02/11/2022 Medical Rec #:  [id number]        Height:       66.0 in Accession #:    [id number]       Weight:       120.4 lb Date of Birth:  [birth year]        BSA:          1.612 m Patient Age:    40 years         BP:           136/56 mmHg Patient Gender: M                HR:           92 bpm. Exam Location:  Inpatient Procedure: Limited Echo, Color Doppler and Cardiac Doppler Indications:    R94.31 Abnormal EKG  History:        Patient has prior history of Echocardiogram examinations, most                 recent 11/14/2021. CHF, Arrythmias:Atrial Fibrillation; Risk                 Factors:Hypertension, Diabetes, Dyslipidemia and CKD.  Sonographer:    Raquel Sarna Senior RDCS Referring Phys: [id number] Francesca Jewett  Sonographer Comments: Exam performed sitting mostly upright due to dypsnea. IMPRESSIONS  1. Left ventricular ejection fraction, by estimation, is 55 to 60%. The left ventricle has normal function.  2. Right ventricular systolic function is normal. The right ventricular size is moderately enlarged. There is moderately elevated pulmonary artery systolic pressure.  3. Left atrial size was mildly dilated.  4. The mitral valve is normal in structure. Mild mitral valve regurgitation. No evidence of mitral stenosis.  5. Tricuspid valve regurgitation is moderate.  6. The aortic valve is tricuspid.  7. The inferior vena cava is dilated in size with <50% respiratory variability, suggesting right atrial pressure of 15 mmHg. FINDINGS  Left Ventricle: Left ventricular ejection fraction, by estimation, is 55 to 60%. The left ventricle has normal function. Right Ventricle: The right ventricular size is moderately enlarged. Right ventricular systolic function is normal. There is moderately elevated pulmonary artery systolic pressure. The tricuspid regurgitant velocity is 3.18 m/s, and with an assumed right atrial pressure of 15 mmHg, the estimated right ventricular systolic pressure is 51.7 mmHg. Left Atrium: Left atrial size was mildly dilated. Mitral Valve: The mitral valve is normal in structure. Mild mitral valve regurgitation. No evidence of mitral valve stenosis. Tricuspid Valve: The tricuspid valve is normal in structure. Tricuspid valve regurgitation is moderate . No evidence of tricuspid stenosis. Aortic Valve: The aortic valve is tricuspid. Pulmonic Valve: The pulmonic valve was normal in structure. Pulmonic valve regurgitation is not visualized. No evidence of pulmonic stenosis. Aorta: Aortic root could not be assessed. Venous: The inferior vena cava is dilated in size with less than 50% respiratory variability, suggesting right atrial pressure of 15 mmHg. LEFT VENTRICLE PLAX 2D LVOT diam:     2.00 cm LV SV:          61 LV SV Index:   38 LVOT Area:     3.14  cm  RIGHT VENTRICLE RV S prime:     17.40 cm/s TAPSE (M-mode): 2.2 cm AORTIC VALVE LVOT Vmax:   119.00 cm/s LVOT Vmean:  71.200 cm/s LVOT VTI:    0.195 m TRICUSPID VALVE TR Peak grad:   40.4 mmHg TR Vmax:        318.00 cm/s  SHUNTS Systemic VTI:  0.20 m Systemic Diam: 2.00 cm Kardie Tobb DO Electronically signed by Berniece Salines DO Signature Date/Time: 02/11/2022/12:34:17 PM    Final    CT ABDOMEN PELVIS WO CONTRAST  Result Date: 02/11/2022 CLINICAL DATA:  Abdominal pain, acute, nonlocalized. Left retroperitoneal paraganglioma, chronic kidney disease. EXAM: CT ABDOMEN AND PELVIS WITHOUT CONTRAST TECHNIQUE: Multidetector CT imaging of the abdomen and pelvis was performed following the standard protocol without IV contrast. RADIATION DOSE REDUCTION: This exam was performed according to the departmental dose-optimization program which includes automated exposure control, adjustment of the mA and/or kV according to patient size and/or use of iterative reconstruction technique. COMPARISON:  01/28/2022. FINDINGS: Lower chest: The heart is enlarged and there is no pericardial effusion. Scattered coronary artery calcifications are noted. There are loculated pleural effusions bilaterally with patchy infiltrates. Hepatobiliary: No focal liver abnormality. No biliary ductal dilatation. The gallbladder is not visualized on exam. Pancreas: Unremarkable. No pancreatic ductal dilatation or surrounding inflammatory changes. Spleen: Normal in size without focal abnormality. Adrenals/Urinary Tract: Mild thickening of the adrenal glands with no evidence of discrete nodule. Cysts are noted in the kidneys bilaterally. There are tiny subcentimeter hyperdense structures in the kidneys bilaterally, possible hemorrhagic or proteinaceous cysts. No renal calculus or hydronephrosis. There is a slightly hyperdense lesion in the upper pole the right kidney measuring 2.4 cm. A Foley catheter is noted  in the urinary bladder and the bladder is nondistended. Stomach/Bowel: Stomach is within normal limits. Appendix is not seen. No evidence of bowel wall thickening, distention, or inflammatory changes. No free air or pneumatosis. A few scattered diverticular present along the colon without evidence of diverticulitis. A moderate amount of retained stool is present in the colon. Vascular/Lymphatic: Aortic atherosclerosis. A stable mass is noted in the retroperitoneum on the left measuring 7.1 x 5.5 cm, compatible with known paraganglioma. No abdominal or pelvic lymphadenopathy. Reproductive: The prostate gland is enlarged. Other: There is a small left inguinal hernia containing nonobstructed bowel. Mesenteric edema is noted. No abdominopelvic ascites. Musculoskeletal: Degenerative changes are present in the thoracolumbar spine. No acute osseous abnormality. IMPRESSION: 1. No acute intra-abdominal process. 2. Moderate amount of retained stool in the colon suggesting constipation. 3. Loculated pleural effusions bilaterally with strandy atelectasis or infiltrate, not significantly changed from the prior exam. 4. Stable retroperitoneal mass on the left, previously characterized as paraganglioma by history. 5. Bilateral renal cysts. There is a complex lesion in the upper pole of the right kidney measuring 2.4 cm. Ultrasound is recommended for further characterization on follow-up. 6. Aortic atherosclerosis. 7. Remaining incidental findings as described above. Electronically Signed   By: Brett Fairy M.D.   On: 02/11/2022 02:00   DG Chest Portable 1 View  Result Date: 02/11/2022 CLINICAL DATA:  Status post central line placement EXAM: PORTABLE CHEST 1 VIEW COMPARISON:  Chest x-ray from the previous day. FINDINGS: Cardiac shadow is enlarged but stable. New right jugular central line is noted at the cavoatrial junction. Stable effusions are seen right greater than left as well as basilar airspace disease right greater than  left. No pneumothorax is seen. IMPRESSION: No pneumothorax following right jugular central line placement Remainder  of the exam is stable. Electronically Signed   By: Inez Catalina M.D.   On: 02/11/2022 00:28   DG Chest Port 1 View  Result Date: 03/03/2022 CLINICAL DATA:  Weakness. EXAM: PORTABLE CHEST 1 VIEW COMPARISON:  December 13, 2021 FINDINGS: The heart size and mediastinal contours are within normal limits. There is calcification of the aortic arch. Persistent marked severity multifocal airspace disease is seen within the mid and lower lungs. Stable bilateral pleural effusions are also noted, right greater than left. No pneumothorax is identified. The visualized skeletal structures are unremarkable. IMPRESSION: 1. Persistent marked severity multifocal airspace disease with stable bilateral pleural effusions, right greater than left. Electronically Signed   By: Virgina Norfolk M.D.   On: 02/12/2022 21:45    Microbiology: Recent Results (from the past 240 hour(s))  Body fluid culture w Gram Stain     Status: None   Collection Time: 02/25/22 11:07 AM   Specimen: Peritoneal Washings; Pleural Fluid  Result Value Ref Range Status   Specimen Description PERITONEAL  Final   Special Requests NONE  Final   Gram Stain   Final    WBC PRESENT, PREDOMINANTLY MONONUCLEAR NO ORGANISMS SEEN    Culture   Final    NO GROWTH 3 DAYS Performed at [facility], [address], [place], [place] [address]    Report Status 02/28/2022 FINAL  Final    Time spent: 25 minutes  Signed: Estill Cotta, MD

## 2022-03-12 NOTE — Progress Notes (Signed)
Palliative Medicine Inpatient Follow Up Note   HPI: 76 y.o. male with a history of retroperitoneal paraganglioma, stage V CKD, chronic HFpEF, PAF who presented to the ED 8/2 with weakness, leg swelling. He was found to have junctional bradycardia with hyperkalemia, acidosis, uremia, volume overload, and elevated LFTs. Imaging revealed loculated right pleural effusion. Temporary HD catheter was inserted and HD initiated urgently on 8/3. The hospitalization has been complicated by respiratory failure, liver failure and thrombocytopenia, epistaxis, coffee-ground emesis, and anemia requiring transfusions.    Palliative care has been asked to get involved to have ongoing goals of care conversations in the setting of patients declining health state. PMT had seen [facility] extensively from 6/4-6/17.  Today's Discussion 04/01/22  *Please note that this is a verbal dictation therefore any spelling or grammatical errors are due to the "[employer]" system interpretation.  Chart reviewed inclusive of vital signs, progress notes, laboratory results, and diagnostic images.   Upon assessment this morning, Praneeth was noted to be encephalopathic through the use of the interpretation service language of Farsi.  Patient's responses to questions were not discernible.  He is pointing to the right part of his abdomen and appears to be in discomfort, when palpated he grimaces.  Per conversation with patient's bedside nurse, Hadrian soft blood pressures and received a bolus which did not affect his blood pressure significantly.  I met at bedside with Dr. Arta Silence who agreed that at this point in time patient was doing quite poorly.  We reviewed that patient's cardiac, renal, and hepatological state were all disarrayed.  Patient's wife, Rosario Adie was called out and I shared that it appeared Myka is likely approaching the end phases of his life as per physical indicators.  We reviewed that at this point time  additional dialysis would be inappropriate as Said would not be able to tolerate treatments.  We reviewed that his mental state has begun to deteriorate and his liver failure continues to be significant.  Discussed the various routes that can be traveled one being a more aggressive approach will the other being a more comfort oriented approach.  Patient's wife shares that she does not want [place] to be in any pain and agrees to meet with me in the late morning. _______________________ Addendum:  I met with patient's spouse, [name] at bedside and we determined that the best path to travel is that of keeping [place] comfortable. [name] asked that I call her son to review with him patient's declining health state.  I was able to call patient's son, Karolee Ohs reviewed Mar[place]ese present medical state.  He is in agreement with transitioning patient to full comfort oriented measures.  He is in the [place] area but plans to come to the bedside by this evening. ________________________ Addendum Two:  I called patient's wife back after speaking to patient's son and updated her.  We talked about transition to comfort measures in house and what that would entail inclusive of medications to control pain, dyspnea, agitation, nausea, itching, and hiccups.  We discussed stopping all uneccessary measures such as cardiac monitoring, blood draws, needle sticks, and frequent vital signs. Created space and opportunity for patient to explore thoughts feelings and fears regarding current medical situation.  Questions and concerns addressed/ Palliative Support Provided   Objective Assessment: Vital Signs Vitals:   04-01-22 1200 Apr 01, 2022 1404  BP: (!) 84/41 (!) 73/37  Pulse: 94 (!) 53  Resp: (!) 24 19  Temp: 98.6 F (37 C) 98.4 F (36.9 C)  SpO2: 97% 93%    Intake/Output Summary (Last 24 hours) at 2022/03/26 1445 Last data filed at 03-26-22 0500 Gross per 24 hour  Intake 120 ml  Output 400 ml  Net -280 ml   Last  Weight  Most recent update: 26-Mar-2022  3:35 AM    Weight  52 kg (114 lb 10.2 oz)            Gen: Frail elderly [place] male in NAD HEENT: Sclera jaundiced, moist mucous membranes CV: Regular rate and rhythm PULM: On 3L nasal cannula, breathing is even and nonlabored ABD: Distended, tender right upper quadrant EXT: Bilateral lower extremity edema  Neuro: Disoriented -Farsi speaking  SUMMARY OF RECOMMENDATIONS   DNAR/DNI  Comfort measures  Low-dose Dilaudid and Ativan for symptom relief  Unrestricted visitation  Time Spent: 90  Billing based on MDM: High  Problems Addressed: One acute or chronic illness or injury that poses a threat to life or bodily function  Amount and/or Complexity of Data: Category 3:Discussion of management or test interpretation with external physician/other qualified health care professional/appropriate source (not separately reported)  Risks: Parenteral controlled substances and Decision not to resuscitate or to de-escalate care because of poor prognosis ______________________________________________________________________________________ [place] Team Team Cell Phone: [phone] Please utilize secure chat with additional questions, if there is no response within 30 minutes please call the above phone number  Palliative Medicine Team providers are available by phone from 7am to 7pm daily and can be reached through the team cell phone.  Should this patient require assistance outside of these hours, please call the patient's attending physician.

## 2022-03-12 NOTE — Progress Notes (Signed)
[facility]                                                                              Adam Reid, is a 76 y.o. male, DOB - Jul 01, 1946, GXQ:[id number] Admit date - 03/02/2022    Outpatient Primary MD for the patient is [name], MD  LOS - 21  days  Chief Complaint  Patient presents with   Weakness       Brief summary   76 y.o. male with a history of retroperitoneal paraganglioma, stage V CKD, chronic HFpEF, PAF who presented to the ED 8/2 with weakness, leg swelling. He was found to have junctional bradycardia with hyperkalemia, acidosis, uremia, volume overload, and elevated LFTs. Imaging revealed loculated right pleural effusion. Temporary HD catheter was inserted and HD initiated urgently on 8/3. The hospitalization has been complicated by respiratory failure, liver failure and thrombocytopenia, epistaxis, coffee-ground emesis, and anemia requiring transfusions.  HD started Monday Wednesday Friday, permacath in place.  Right upper extremity AV graft was placed 7/31. 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 [facility]) -HD started since 8/3, currently on MWF schedule -Catheter placed 8/7, AV graft 7/31, patient accepted to outpatient dialysis center -This a.m., patient noted to be hypotensive, encephalopathic, appears to be in pain -Palliative medicine following, discussed with patient's family at length, agreeable with comfort care  Acute on chronic respiratory failure with hypoxia -Currently respiratory status stable, acute phase resolved, at home on 2 to 3 L O2 via [place] -Placed on comfort care status   Acute liver failure with coagulopathy, hyperbilirubinemia - CT scan without biliary obstruction, Dopplers negative, serological work-up including ANA, smooth muscle IgG, CMV, EBV, hepatitis  panel is negative. 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 -Paracentesis with bloody fluid, negative for malignancy -Patient seen by GI this admission -Appeared to have abdominal pain today, abdominal x-ray showed ileus and moderate stool burden, no perforation or pneumoperitoneum   Sepsis due to Pseudomonas UTI -Treated with cefepime  Acute blood loss anemia superimposed on chronic anemia, iron deficiency and CKD -received 3 units packed RBC, ESA with HD  Coffee-ground emesis with history of GERD, Barrett's esophagus -Resolved, continue PPI, conservative management   Paroxysmal A-fib, junctional bradycardia on arrival Hypotensive -Patient was placed on midodrine today however now on comfort care status   Diabetes mellitus type 2, uncontrolled with hyperglycemia, with CKD -On sliding scale insulin, while inpatient -Placed on comfort care status    Generalized debility - very frail and debilitated, accepted to SNF  Pressure Injury Documentation: Pressure injury, sacrum left deep tissue, POA  Severe malnutrition Nutrition Problem: Severe Malnutrition Etiology: chronic illness (CKD now ESRD) Signs/Symptoms: severe muscle depletion, severe fat depletion Interventions: Refer to RD note for recommendations, Liberalize Diet, MVI, Education, Magic cup, Nepro shake  Estimated body mass index is 18.5 kg/m as calculated from the following:   Height as of this encounter: '5\' 6"'$  (1.676 m).   Weight as of this encounter: 52  kg.  Code Status: DNR DVT Prophylaxis:     Level of Care: Level of care: Progressive Family Communication:  Disposition Plan:    Placed on comfort care status today  Procedures:  HD   Consultants:   Nephrology Gastroenterology Palliative medicine Interventional etiology  Antimicrobials:   Anti-infectives (From admission, onward)    Start     Dose/Rate Route Frequency Ordered Stop   02/25/22  1200  ceFAZolin (ANCEF) IVPB 2g/100 mL premix        2 g 200 mL/hr over 30 Minutes Intravenous  Once 02/25/22 1100 02/25/22 1159   02/18/22 1045  vancomycin (VANCOCIN) IVPB 1000 mg/200 mL premix        1,000 mg 200 mL/hr over 60 Minutes Intravenous  Once 02/18/22 0952 02/18/22 1954   02/15/22 0600  ceFAZolin (ANCEF) IVPB 2g/100 mL premix        2 g 200 mL/hr over 30 Minutes Intravenous To Radiology 02/11/22 1423 02/15/22 0708   02/19/2022 2000  ceFEPIme (MAXIPIME) 1 g in sodium chloride 0.9 % 100 mL IVPB        1 g 200 mL/hr over 30 Minutes Intravenous Every 24 hours 02/14/2022 1847 02/18/22 0300   02/12/22 1100  cefTRIAXone (ROCEPHIN) 1 g in sodium chloride 0.9 % 100 mL IVPB  Status:  Discontinued        1 g 200 mL/hr over 30 Minutes Intravenous Every 24 hours 02/12/22 1019 02/22/2022 1847   02/09/2022 2330  piperacillin-tazobactam (ZOSYN) IVPB 3.375 g        3.375 g 100 mL/hr over 30 Minutes Intravenous  Once 02/19/2022 2319 02/11/22 0015          Medications  darbepoetin (ARANESP) injection - DIALYSIS  200 mcg Subcutaneous Q Mon-HD      Subjective:   Adam Reid was seen and examined today AM.  Frail and debilitated, appears to be in pain, abdominal during my exam.  Somewhat lethargic.  Borderline BP.    Objective:   Vitals:   03/03/22 2300 03/31/2022 0300 03/31/2022 0800 2022/03/31 1200  BP: (!) 104/45 (!) 93/44 (!) 103/50 (!) 84/41  Pulse: 78 88 92 94  Resp: '11 17 20 '$ (!) 24  Temp: 98.1 F (36.7 C) 98.4 F (36.9 C)  98.6 F (37 C)  TempSrc: Axillary Axillary  Oral  SpO2:   99% 97%  Weight:  52 kg    Height:        Intake/Output Summary (Last 24 hours) at 03-31-22 1220 Last data filed at March 31, 2022 0500 Gross per 24 hour  Intake 120 ml  Output 400 ml  Net -280 ml     Wt Readings from Last 3 Encounters:  31-Mar-2022 52 kg  02/08/22 52.2 kg  02/08/22 49.9 kg   Physical Exam General: Awake, somewhat lethargic, in pain, frail, ill-appearing Cardiovascular: S1 S2  clear, RRR.  Respiratory: CTAB, no wheezing, rales or rhonchi Gastrointestinal: Soft, diffuse tenderness Ext: 1-2+ pedal edema bilaterally   Data Reviewed:  I have personally reviewed following labs    CBC Lab Results  Component Value Date   WBC 3.0 (L) 03/31/2022   RBC 3.23 (L) 03/31/2022   HGB 10.1 (L) Mar 31, 2022   HCT 29.3 (L) 03/31/22   MCV 90.7 March 31, 2022   MCH 31.3 2022-03-31   PLT 160 2022-03-31   MCHC 34.5 03/31/2022   RDW 24.5 (H) 31-Mar-2022   LYMPHSABS 0.2 (L) 02/27/2022   MONOABS 0.7 02/27/2022   EOSABS 0.0 02/27/2022   BASOSABS 0.1 02/27/2022  Last metabolic panel Lab Results  Component Value Date   NA 134 (L) 03/03/2022   K 5.8 (H) 03/03/2022   CL 95 (L) 03/03/2022   CO2 23 03/03/2022   BUN 62 (H) 03/03/2022   CREATININE 4.29 (H) 03/03/2022   GLUCOSE 97 03/03/2022   GFRNONAA 14 (L) 03/03/2022   GFRAA 21 (L) 06/19/2020   CALCIUM 7.3 (L) 03/03/2022   PHOS 11.8 (H) 03/01/2022   PROT 4.9 (L) 03/03/2022   ALBUMIN 1.8 (L) 03/03/2022   LABGLOB 3.2 06/19/2020   AGRATIO 1.4 06/19/2020   BILITOT 23.3 (HH) 03/03/2022   ALKPHOS 1,473 (H) 03/03/2022   AST 155 (H) 03/03/2022   ALT 31 03/03/2022   ANIONGAP 16 (H) 03/03/2022    CBG (last 3)  Recent Labs    03/03/22 2113 03-15-2022 0603 2022/03/15 0628  GLUCAP 124* 34* 106*      Coagulation Profile: Recent Labs  Lab 02/27/22 0713  INR 1.2      Avon Mergenthaler M.D. [facility] 03/15/22, 12:20 PM  Available via Epic secure chat 7am-7pm After 7 pm, please refer to night coverage provider listed on amion.

## 2022-03-12 NOTE — Progress Notes (Signed)
Hypoglycemic Event  CBG: 34  Treatment: D50 50 mL (25 gm)  Symptoms: Nervous/irritable  Follow-up CBG: Time:625 am CBG Result:106  Possible Reasons for Event: Inadequate meal intake  Comments/MD notified:Dr. Marlyce Huge paged    Betti Cruz Tom-Johnson

## 2022-03-12 NOTE — Progress Notes (Addendum)
Paged MD Rai at 0730 about blood pressure of 85/42 (56) sustained in this area for the last couple hours. Orders to hold all blood pressure medication and give two 50cc boluses and recheck blood pressure in between. Patient is resting in bed.  Blood pressure after first bolus is 93/46. Blood pressure after second bolus is 92/44. MD aware.

## 2022-03-12 DEATH — deceased

## 2022-10-13 IMAGING — DX DG CHEST 1V PORT
1 series · 1 of 1 positions shown · non-contrast
Comparison: 08/28/2021 chest radiograph.

CLINICAL DATA: Dyspnea, hypoxia

EXAM:
PORTABLE CHEST 1 VIEW

[chest ap]
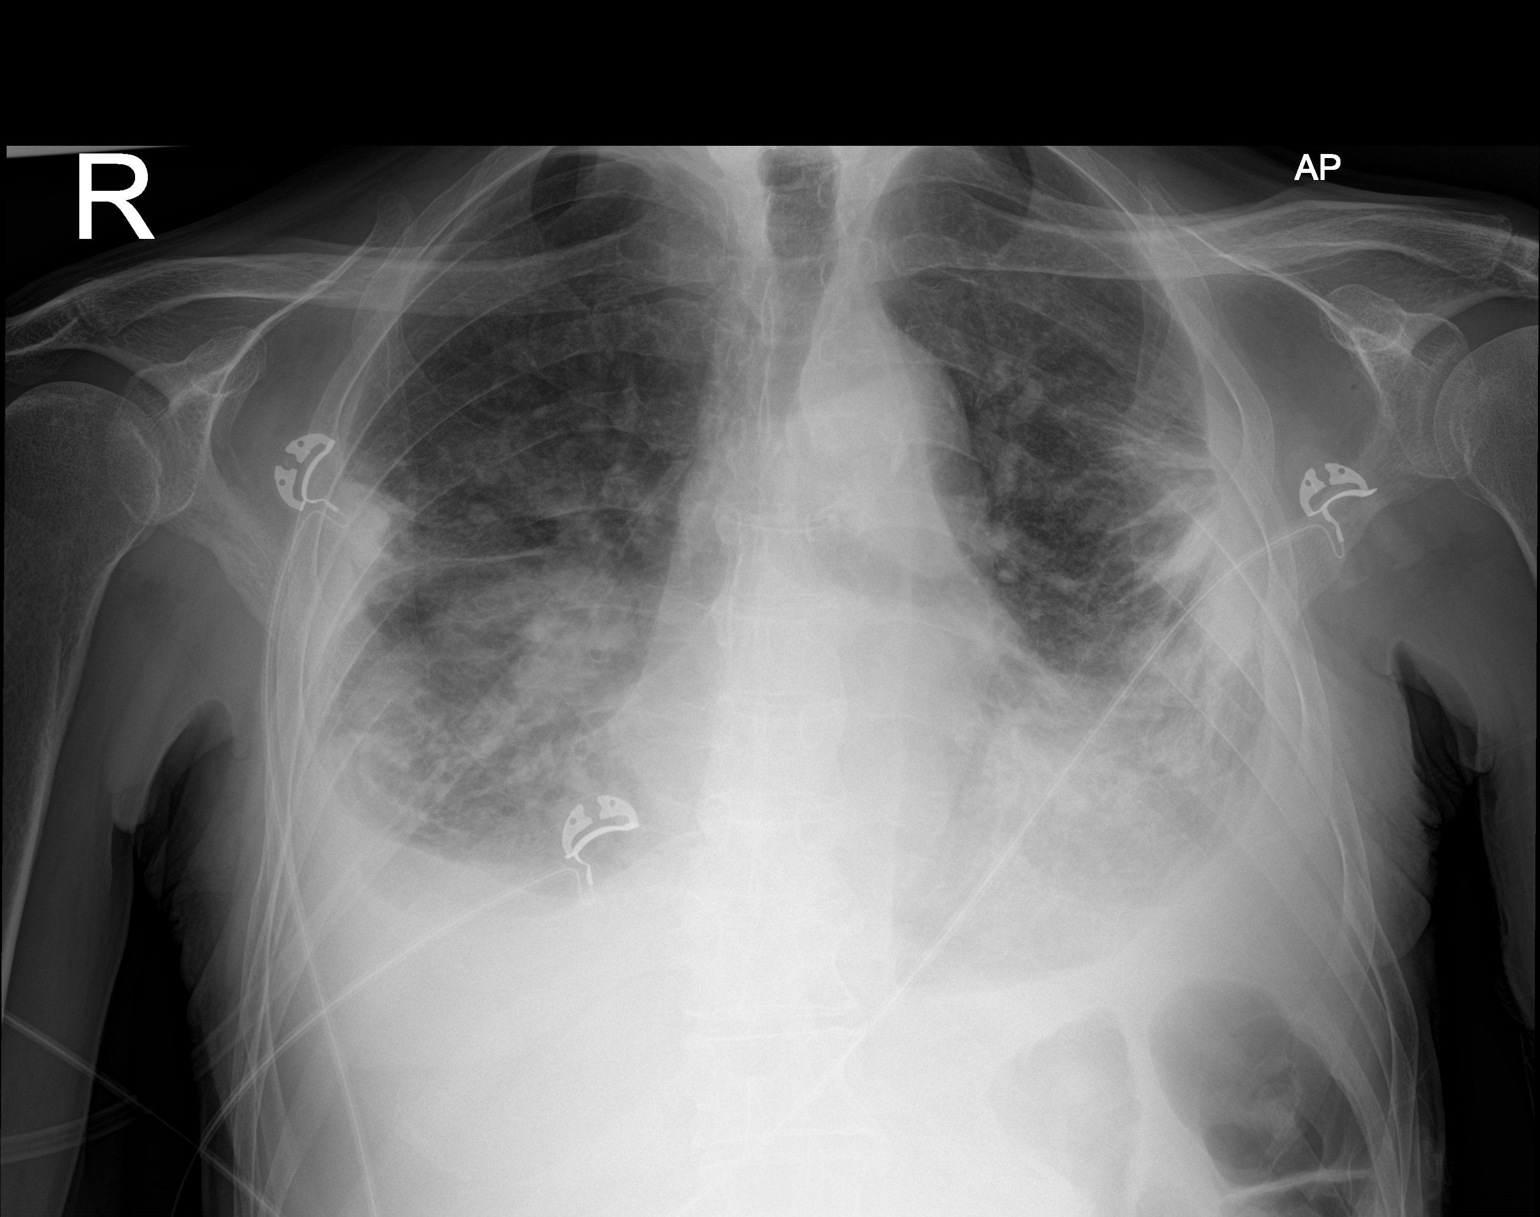

[1 of 1 positions shown; findings below may reference images not displayed]

FINDINGS: Stable cardiomediastinal silhouette with top-normal heart size. No
pneumothorax. Small to moderate bilateral pleural effusions, chronic
and similar. Hazy bibasilar lung opacities, similar. Streaky
curvilinear opacities at the periphery of the mid lungs bilaterally,
similar accounting for different technique.
IMPRESSION: 1. Chronic small to moderate bilateral pleural effusions.
2. Hazy bibasilar lung opacities and streaky curvilinear opacities
at the periphery of the mid lungs bilaterally, favor scarring or
atelectasis.

## 2023-01-08 IMAGING — DX DG CHEST 2V
2 series · 2 of 2 positions shown · non-contrast
Comparison: 11/19/2021

CLINICAL DATA: Pleural effusions, weight gain pain, edema, CHF

EXAM:
CHEST - 2 VIEW

[chest pa]
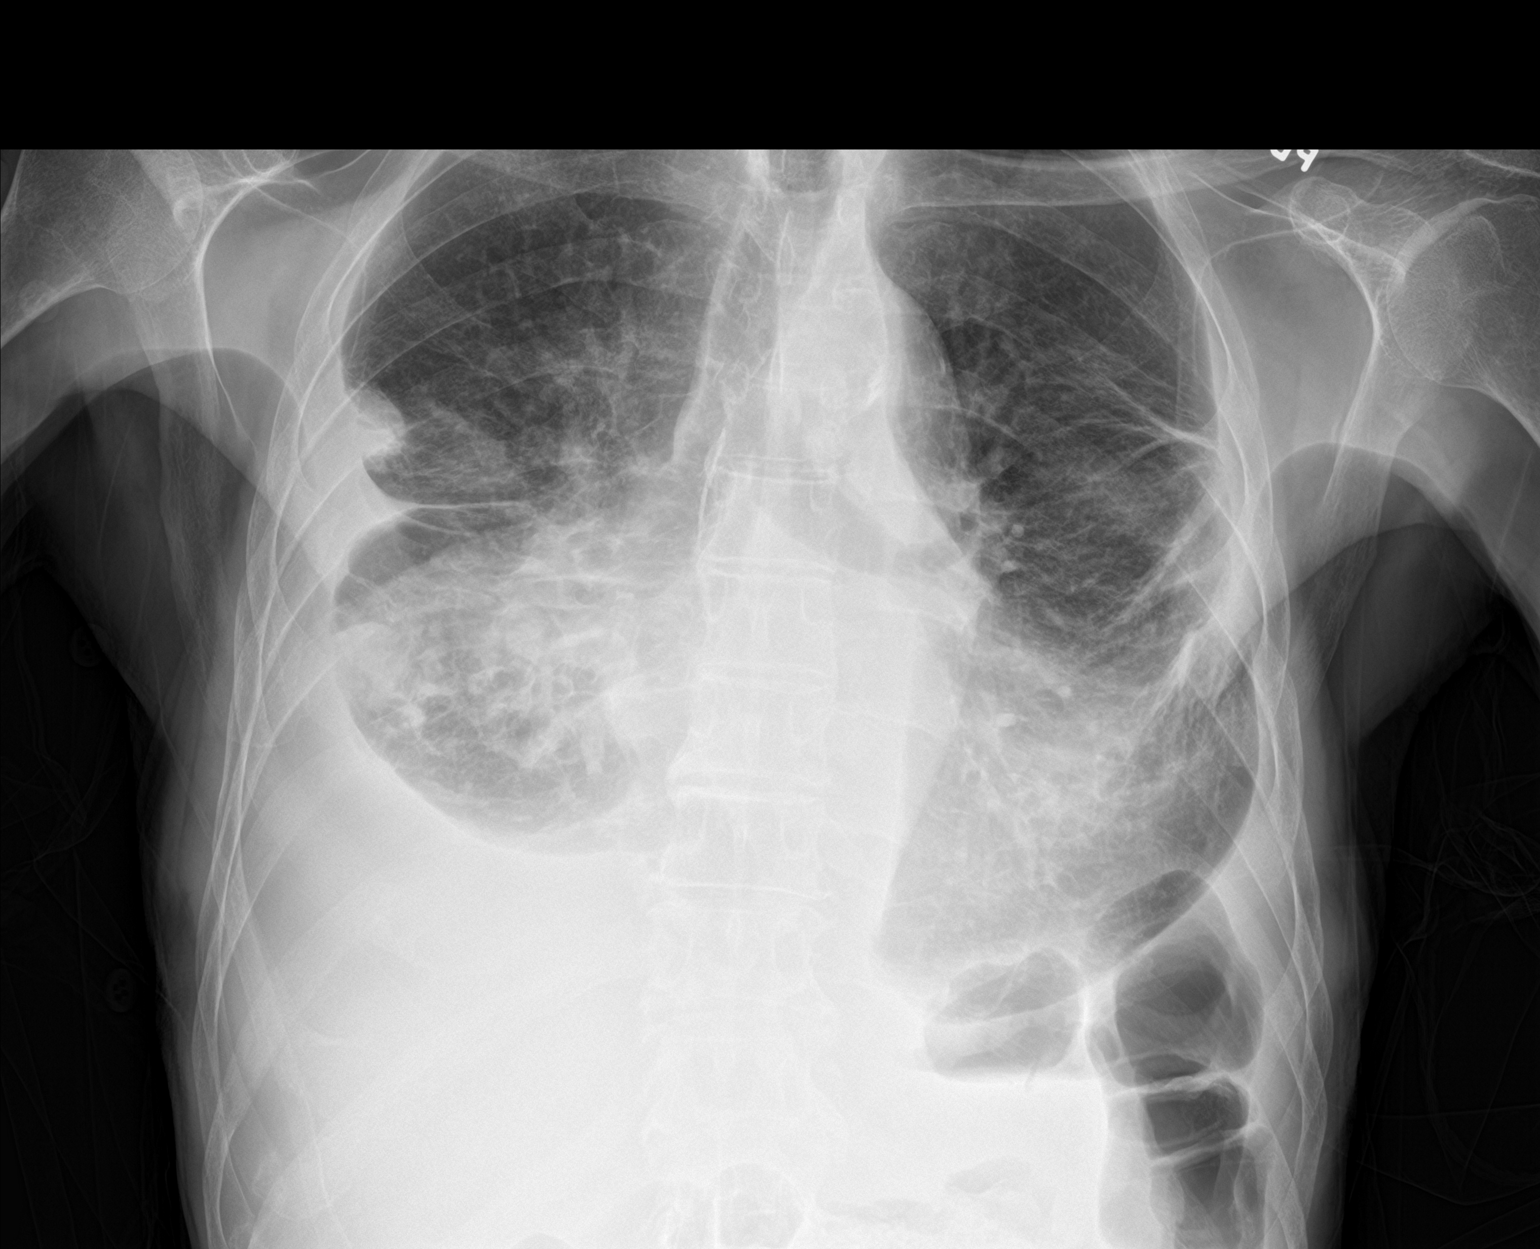

[chest lat]
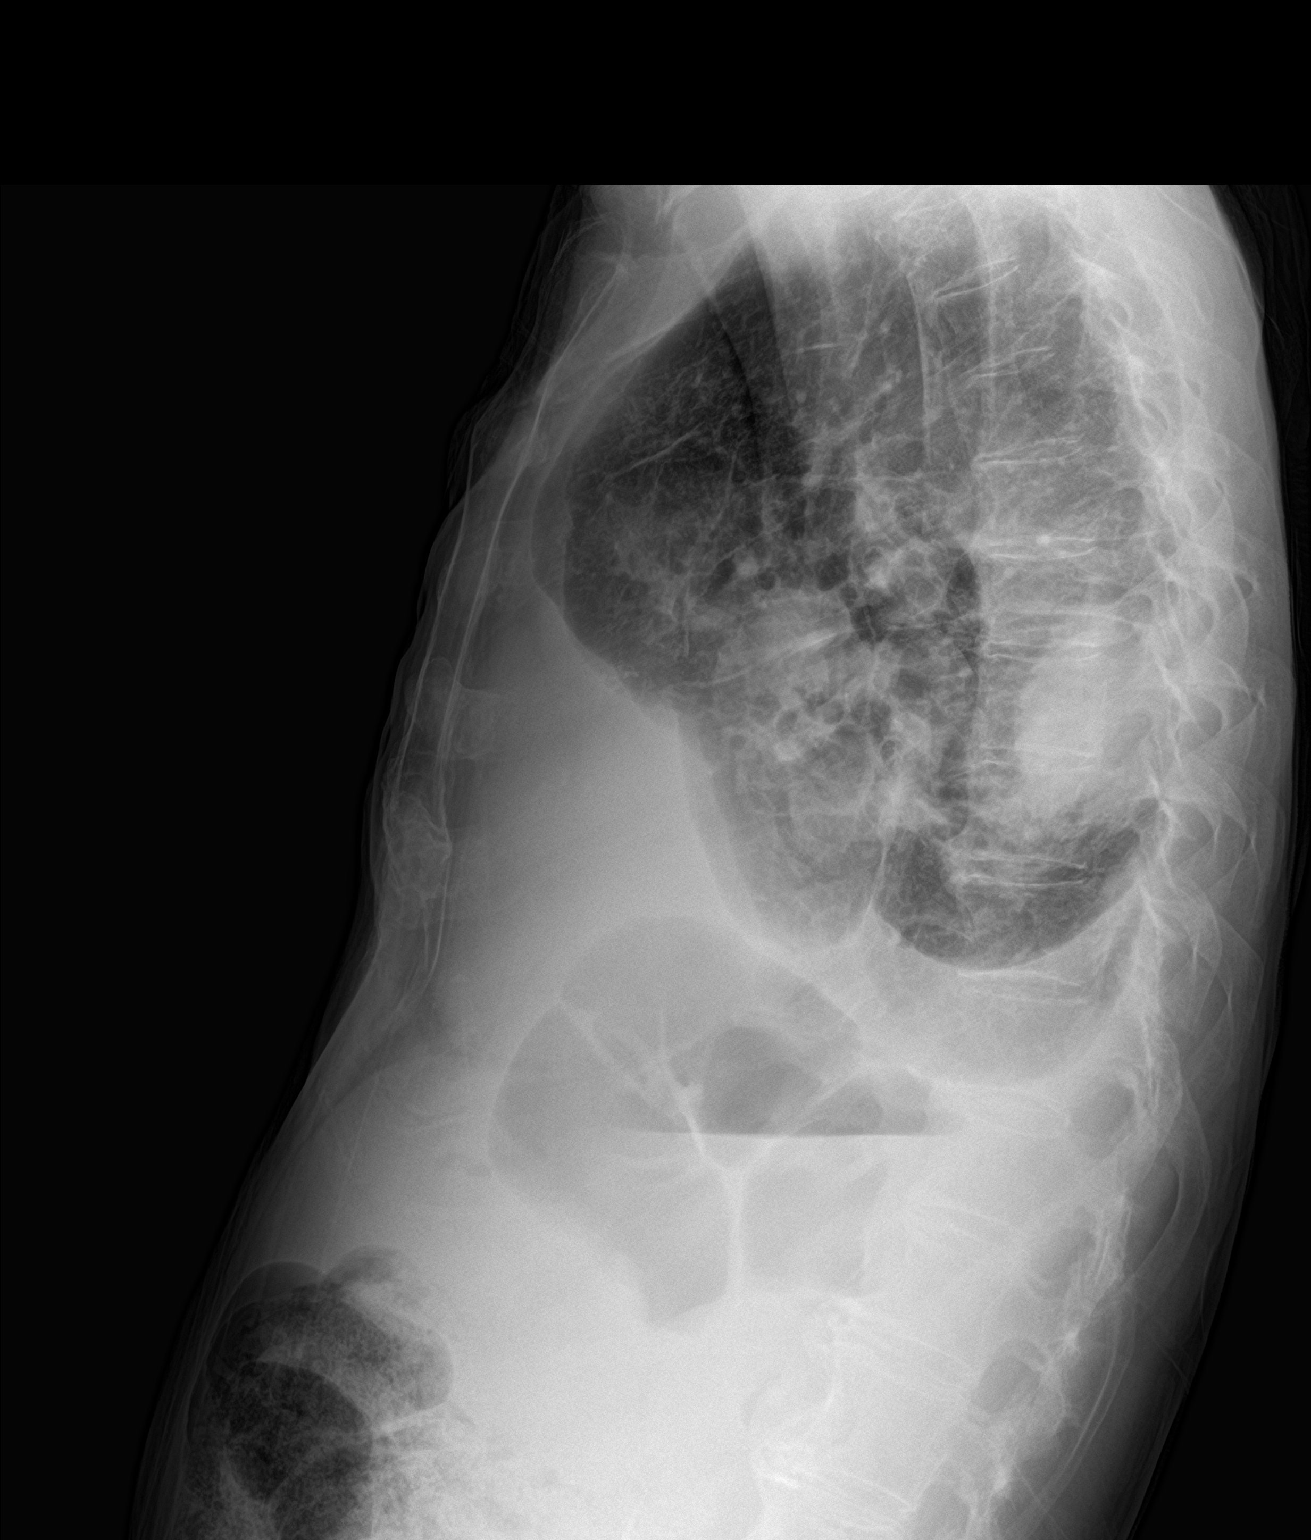

[2 of 2 positions shown; findings below may reference images not displayed]

FINDINGS: Upper normal heart size.

Atherosclerotic calcification and mild tortuosity of thoracic aorta.

Pulmonary vascularity normal.

Extensive scarring in the mid lungs bilaterally with BILATERAL
pleural effusions, larger on RIGHT.

Bibasilar infiltrates and atelectasis.

Nodular focus versus atelectasis in the RIGHT upper lobe again
identified.

Posterior opacity in the mid to lower chest on lateral view
unchanged.

No pneumothorax.
IMPRESSION: Persistent BILATERAL pleural effusions larger on RIGHT.

BILATERAL pulmonary scarring with persistent bibasilar infiltrates
and atelectasis.

Persistent nodular density or atelectasis at lateral RIGHT upper
lobe.
